# Patient Record
Sex: Female | Born: 1950 | Race: White | Hispanic: No | Marital: Married | Smoking: Never smoker
Health system: Southern US, Community
[De-identification: ages and names within clinical notes are randomized; demographics above are authoritative.]

## PROBLEM LIST (undated history)

## (undated) DIAGNOSIS — K5790 Diverticulosis of intestine, part unspecified, without perforation or abscess without bleeding: Secondary | ICD-10-CM

## (undated) DIAGNOSIS — F32A Depression, unspecified: Secondary | ICD-10-CM

## (undated) DIAGNOSIS — F419 Anxiety disorder, unspecified: Secondary | ICD-10-CM

## (undated) DIAGNOSIS — K439 Ventral hernia without obstruction or gangrene: Secondary | ICD-10-CM

## (undated) DIAGNOSIS — I499 Cardiac arrhythmia, unspecified: Secondary | ICD-10-CM

## (undated) DIAGNOSIS — R933 Abnormal findings on diagnostic imaging of other parts of digestive tract: Secondary | ICD-10-CM

## (undated) DIAGNOSIS — G8929 Other chronic pain: Secondary | ICD-10-CM

## (undated) DIAGNOSIS — K760 Fatty (change of) liver, not elsewhere classified: Secondary | ICD-10-CM

## (undated) DIAGNOSIS — K5792 Diverticulitis of intestine, part unspecified, without perforation or abscess without bleeding: Secondary | ICD-10-CM

## (undated) DIAGNOSIS — G3184 Mild cognitive impairment, so stated: Secondary | ICD-10-CM

## (undated) DIAGNOSIS — F329 Major depressive disorder, single episode, unspecified: Secondary | ICD-10-CM

## (undated) DIAGNOSIS — E119 Type 2 diabetes mellitus without complications: Secondary | ICD-10-CM

## (undated) DIAGNOSIS — I6529 Occlusion and stenosis of unspecified carotid artery: Secondary | ICD-10-CM

## (undated) DIAGNOSIS — K648 Other hemorrhoids: Secondary | ICD-10-CM

## (undated) DIAGNOSIS — Z8489 Family history of other specified conditions: Secondary | ICD-10-CM

## (undated) DIAGNOSIS — M21619 Bunion of unspecified foot: Secondary | ICD-10-CM

## (undated) DIAGNOSIS — Z8719 Personal history of other diseases of the digestive system: Secondary | ICD-10-CM

## (undated) DIAGNOSIS — M79671 Pain in right foot: Secondary | ICD-10-CM

## (undated) DIAGNOSIS — K219 Gastro-esophageal reflux disease without esophagitis: Secondary | ICD-10-CM

## (undated) DIAGNOSIS — G4733 Obstructive sleep apnea (adult) (pediatric): Secondary | ICD-10-CM

## (undated) DIAGNOSIS — R740 Nonspecific elevation of levels of transaminase and lactic acid dehydrogenase [LDH]: Secondary | ICD-10-CM

## (undated) DIAGNOSIS — I639 Cerebral infarction, unspecified: Secondary | ICD-10-CM

## (undated) DIAGNOSIS — I4891 Unspecified atrial fibrillation: Secondary | ICD-10-CM

## (undated) DIAGNOSIS — M47812 Spondylosis without myelopathy or radiculopathy, cervical region: Secondary | ICD-10-CM

## (undated) DIAGNOSIS — R569 Unspecified convulsions: Secondary | ICD-10-CM

## (undated) DIAGNOSIS — R1310 Dysphagia, unspecified: Secondary | ICD-10-CM

## (undated) DIAGNOSIS — M549 Dorsalgia, unspecified: Secondary | ICD-10-CM

## (undated) DIAGNOSIS — G43909 Migraine, unspecified, not intractable, without status migrainosus: Secondary | ICD-10-CM

## (undated) DIAGNOSIS — IMO0002 Reserved for concepts with insufficient information to code with codable children: Secondary | ICD-10-CM

## (undated) DIAGNOSIS — I10 Essential (primary) hypertension: Secondary | ICD-10-CM

## (undated) DIAGNOSIS — M797 Fibromyalgia: Secondary | ICD-10-CM

## (undated) DIAGNOSIS — G40409 Other generalized epilepsy and epileptic syndromes, not intractable, without status epilepticus: Secondary | ICD-10-CM

## (undated) DIAGNOSIS — R197 Diarrhea, unspecified: Secondary | ICD-10-CM

## (undated) DIAGNOSIS — K2950 Unspecified chronic gastritis without bleeding: Secondary | ICD-10-CM

## (undated) DIAGNOSIS — E785 Hyperlipidemia, unspecified: Secondary | ICD-10-CM

## (undated) DIAGNOSIS — K819 Cholecystitis, unspecified: Secondary | ICD-10-CM

## (undated) DIAGNOSIS — R9389 Abnormal findings on diagnostic imaging of other specified body structures: Secondary | ICD-10-CM

## (undated) DIAGNOSIS — M1990 Unspecified osteoarthritis, unspecified site: Secondary | ICD-10-CM

## (undated) DIAGNOSIS — D649 Anemia, unspecified: Secondary | ICD-10-CM

## (undated) DIAGNOSIS — E039 Hypothyroidism, unspecified: Secondary | ICD-10-CM

## (undated) DIAGNOSIS — M62830 Muscle spasm of back: Secondary | ICD-10-CM

## (undated) DIAGNOSIS — G459 Transient cerebral ischemic attack, unspecified: Secondary | ICD-10-CM

## (undated) DIAGNOSIS — E1142 Type 2 diabetes mellitus with diabetic polyneuropathy: Secondary | ICD-10-CM

## (undated) DIAGNOSIS — M25562 Pain in left knee: Secondary | ICD-10-CM

## (undated) DIAGNOSIS — M2550 Pain in unspecified joint: Secondary | ICD-10-CM

## (undated) DIAGNOSIS — G5600 Carpal tunnel syndrome, unspecified upper limb: Secondary | ICD-10-CM

## (undated) DIAGNOSIS — D492 Neoplasm of unspecified behavior of bone, soft tissue, and skin: Secondary | ICD-10-CM

## (undated) DIAGNOSIS — D869 Sarcoidosis, unspecified: Secondary | ICD-10-CM

## (undated) DIAGNOSIS — J189 Pneumonia, unspecified organism: Secondary | ICD-10-CM

## (undated) DIAGNOSIS — T460X1A Poisoning by cardiac-stimulant glycosides and drugs of similar action, accidental (unintentional), initial encounter: Secondary | ICD-10-CM

## (undated) HISTORY — DX: Gastro-esophageal reflux disease without esophagitis: K21.9

## (undated) HISTORY — DX: Poisoning by cardiac-stimulant glycosides and drugs of similar action, accidental (unintentional), initial encounter: T46.0X1A

## (undated) HISTORY — DX: Abnormal findings on diagnostic imaging of other specified body structures: R93.89

## (undated) HISTORY — DX: Muscle spasm of back: M62.830

## (undated) HISTORY — DX: Depression, unspecified: F32.A

## (undated) HISTORY — DX: Fatty (change of) liver, not elsewhere classified: K76.0

## (undated) HISTORY — DX: Occlusion and stenosis of unspecified carotid artery: I65.29

## (undated) HISTORY — PX: BREAST SURGERY: SHX581

## (undated) HISTORY — DX: Unspecified osteoarthritis, unspecified site: M19.90

## (undated) HISTORY — PX: DILATION AND CURETTAGE OF UTERUS: SHX78

## (undated) HISTORY — DX: Fibromyalgia: M79.7

## (undated) HISTORY — DX: Obstructive sleep apnea (adult) (pediatric): G47.33

## (undated) HISTORY — PX: BREAST BIOPSY: SHX20

## (undated) HISTORY — DX: Type 2 diabetes mellitus with diabetic polyneuropathy: E11.42

## (undated) HISTORY — PX: FOOT SURGERY: SHX648

## (undated) HISTORY — DX: Cholecystitis, unspecified: K81.9

## (undated) HISTORY — DX: Nonspecific elevation of levels of transaminase and lactic acid dehydrogenase (ldh): R74.0

## (undated) HISTORY — DX: Other hemorrhoids: K64.8

## (undated) HISTORY — DX: Hypothyroidism, unspecified: E03.9

## (undated) HISTORY — DX: Diverticulosis of intestine, part unspecified, without perforation or abscess without bleeding: K57.90

## (undated) HISTORY — PX: BILATERAL OOPHORECTOMY: SHX1221

## (undated) HISTORY — PX: ABDOMINAL HYSTERECTOMY: SHX81

## (undated) HISTORY — PX: LAPAROSCOPIC CHOLECYSTECTOMY: SUR755

## (undated) HISTORY — DX: Bunion of unspecified foot: M21.619

## (undated) HISTORY — DX: Spondylosis without myelopathy or radiculopathy, cervical region: M47.812

## (undated) HISTORY — PX: ESOPHAGEAL DILATION: SHX303

## (undated) HISTORY — DX: Reserved for concepts with insufficient information to code with codable children: IMO0002

## (undated) HISTORY — DX: Essential (primary) hypertension: I10

## (undated) HISTORY — PX: SALPINGOOPHORECTOMY: SHX82

## (undated) HISTORY — DX: Carpal tunnel syndrome, unspecified upper limb: G56.00

## (undated) HISTORY — DX: Mild cognitive impairment of uncertain or unknown etiology: G31.84

## (undated) HISTORY — DX: Transient cerebral ischemic attack, unspecified: G45.9

## (undated) HISTORY — DX: Unspecified atrial fibrillation: I48.91

## (undated) HISTORY — DX: Neoplasm of unspecified behavior of bone, soft tissue, and skin: D49.2

## (undated) HISTORY — PX: BUNIONECTOMY: SHX129

## (undated) HISTORY — DX: Pain in unspecified joint: M25.50

## (undated) HISTORY — DX: Hyperlipidemia, unspecified: E78.5

## (undated) HISTORY — DX: Diverticulitis of intestine, part unspecified, without perforation or abscess without bleeding: K57.92

## (undated) HISTORY — DX: Major depressive disorder, single episode, unspecified: F32.9

## (undated) HISTORY — DX: Unspecified chronic gastritis without bleeding: K29.50

## (undated) HISTORY — DX: Diarrhea, unspecified: R19.7

## (undated) HISTORY — DX: Pain in left knee: M25.562

## (undated) HISTORY — DX: Migraine, unspecified, not intractable, without status migrainosus: G43.909

## (undated) HISTORY — DX: Sarcoidosis, unspecified: D86.9

## (undated) HISTORY — PX: SKIN CANCER EXCISION: SHX779

## (undated) HISTORY — DX: Pain in right foot: M79.671

---
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
## 1974-03-09 HISTORY — PX: TUBAL LIGATION: SHX77

## 1988-03-09 HISTORY — PX: HIATAL HERNIA REPAIR: SHX195

## 1990-03-09 HISTORY — PX: LIPOSUCTION: SHX10

## 1990-03-09 HISTORY — PX: OTHER SURGICAL HISTORY: SHX169

## 1999-02-25 ENCOUNTER — Encounter (INDEPENDENT_AMBULATORY_CARE_PROVIDER_SITE_OTHER): Payer: Self-pay | Admitting: Gastroenterology

## 1999-02-26 ENCOUNTER — Emergency Department (HOSPITAL_COMMUNITY): Admission: EM | Admit: 1999-02-26 | Discharge: 1999-02-26 | Payer: Self-pay | Admitting: Emergency Medicine

## 1999-06-02 ENCOUNTER — Ambulatory Visit (HOSPITAL_COMMUNITY): Admission: RE | Admit: 1999-06-02 | Discharge: 1999-06-02 | Payer: Self-pay | Admitting: Gastroenterology

## 1999-06-02 ENCOUNTER — Encounter: Payer: Self-pay | Admitting: Gastroenterology

## 2000-01-24 ENCOUNTER — Encounter: Payer: Self-pay | Admitting: Emergency Medicine

## 2000-01-24 ENCOUNTER — Emergency Department (HOSPITAL_COMMUNITY): Admission: EM | Admit: 2000-01-24 | Discharge: 2000-01-24 | Payer: Self-pay | Admitting: Emergency Medicine

## 2000-05-07 ENCOUNTER — Other Ambulatory Visit: Admission: RE | Admit: 2000-05-07 | Discharge: 2000-05-07 | Payer: Self-pay | Admitting: Obstetrics and Gynecology

## 2000-06-23 ENCOUNTER — Other Ambulatory Visit: Admission: RE | Admit: 2000-06-23 | Discharge: 2000-06-23 | Payer: Self-pay | Admitting: Obstetrics and Gynecology

## 2000-08-02 ENCOUNTER — Emergency Department (HOSPITAL_COMMUNITY): Admission: EM | Admit: 2000-08-02 | Discharge: 2000-08-02 | Payer: Self-pay | Admitting: Internal Medicine

## 2000-10-28 ENCOUNTER — Ambulatory Visit (HOSPITAL_COMMUNITY): Admission: RE | Admit: 2000-10-28 | Discharge: 2000-10-28 | Payer: Self-pay | Admitting: Gastroenterology

## 2000-10-28 ENCOUNTER — Encounter (INDEPENDENT_AMBULATORY_CARE_PROVIDER_SITE_OTHER): Payer: Self-pay | Admitting: Specialist

## 2000-11-17 ENCOUNTER — Inpatient Hospital Stay (HOSPITAL_COMMUNITY): Admission: EM | Admit: 2000-11-17 | Discharge: 2000-11-19 | Payer: Self-pay | Admitting: *Deleted

## 2000-11-18 ENCOUNTER — Encounter: Payer: Self-pay | Admitting: Internal Medicine

## 2001-01-31 ENCOUNTER — Ambulatory Visit (HOSPITAL_COMMUNITY): Admission: RE | Admit: 2001-01-31 | Discharge: 2001-01-31 | Payer: Self-pay | Admitting: Internal Medicine

## 2001-02-09 ENCOUNTER — Encounter: Payer: Self-pay | Admitting: Emergency Medicine

## 2001-02-09 ENCOUNTER — Encounter: Payer: Self-pay | Admitting: Internal Medicine

## 2001-02-09 ENCOUNTER — Inpatient Hospital Stay (HOSPITAL_COMMUNITY): Admission: EM | Admit: 2001-02-09 | Discharge: 2001-02-14 | Payer: Self-pay | Admitting: Emergency Medicine

## 2001-03-15 ENCOUNTER — Encounter: Payer: Self-pay | Admitting: Gastroenterology

## 2001-03-15 ENCOUNTER — Ambulatory Visit (HOSPITAL_COMMUNITY): Admission: RE | Admit: 2001-03-15 | Discharge: 2001-03-15 | Payer: Self-pay | Admitting: Gastroenterology

## 2001-03-18 ENCOUNTER — Ambulatory Visit (HOSPITAL_COMMUNITY): Admission: RE | Admit: 2001-03-18 | Discharge: 2001-03-18 | Payer: Self-pay | Admitting: Gastroenterology

## 2001-03-18 ENCOUNTER — Encounter: Payer: Self-pay | Admitting: Gastroenterology

## 2001-04-27 ENCOUNTER — Encounter: Admission: RE | Admit: 2001-04-27 | Discharge: 2001-04-27 | Payer: Self-pay | Admitting: *Deleted

## 2001-05-02 ENCOUNTER — Encounter: Admission: RE | Admit: 2001-05-02 | Discharge: 2001-05-02 | Payer: Self-pay | Admitting: *Deleted

## 2001-05-05 ENCOUNTER — Ambulatory Visit (HOSPITAL_COMMUNITY): Admission: RE | Admit: 2001-05-05 | Discharge: 2001-05-05 | Payer: Self-pay | Admitting: Gastroenterology

## 2001-05-17 ENCOUNTER — Ambulatory Visit (HOSPITAL_COMMUNITY): Admission: RE | Admit: 2001-05-17 | Discharge: 2001-05-17 | Payer: Self-pay | Admitting: Internal Medicine

## 2001-06-17 ENCOUNTER — Encounter: Payer: Self-pay | Admitting: Internal Medicine

## 2001-06-17 ENCOUNTER — Ambulatory Visit (HOSPITAL_COMMUNITY): Admission: RE | Admit: 2001-06-17 | Discharge: 2001-06-17 | Payer: Self-pay | Admitting: Internal Medicine

## 2002-06-29 ENCOUNTER — Inpatient Hospital Stay (HOSPITAL_COMMUNITY): Admission: EM | Admit: 2002-06-29 | Discharge: 2002-07-05 | Payer: Self-pay | Admitting: Cardiology

## 2002-06-29 ENCOUNTER — Encounter: Payer: Self-pay | Admitting: Emergency Medicine

## 2002-07-01 ENCOUNTER — Encounter: Payer: Self-pay | Admitting: Cardiology

## 2002-07-04 ENCOUNTER — Encounter: Payer: Self-pay | Admitting: Cardiovascular Disease

## 2002-09-18 ENCOUNTER — Encounter: Admission: RE | Admit: 2002-09-18 | Discharge: 2002-09-18 | Payer: Self-pay | Admitting: Internal Medicine

## 2002-10-10 ENCOUNTER — Ambulatory Visit (HOSPITAL_COMMUNITY): Admission: RE | Admit: 2002-10-10 | Discharge: 2002-10-10 | Payer: Self-pay | Admitting: Physician Assistant

## 2002-11-07 ENCOUNTER — Ambulatory Visit (HOSPITAL_BASED_OUTPATIENT_CLINIC_OR_DEPARTMENT_OTHER): Admission: RE | Admit: 2002-11-07 | Discharge: 2002-11-07 | Payer: Self-pay | Admitting: Cardiology

## 2002-11-14 ENCOUNTER — Encounter: Admission: RE | Admit: 2002-11-14 | Discharge: 2002-11-14 | Payer: Self-pay | Admitting: Internal Medicine

## 2002-11-28 ENCOUNTER — Encounter: Admission: RE | Admit: 2002-11-28 | Discharge: 2002-11-28 | Payer: Self-pay | Admitting: Internal Medicine

## 2002-12-07 ENCOUNTER — Encounter: Admission: RE | Admit: 2002-12-07 | Discharge: 2002-12-07 | Payer: Self-pay | Admitting: Internal Medicine

## 2002-12-29 ENCOUNTER — Encounter: Admission: RE | Admit: 2002-12-29 | Discharge: 2002-12-29 | Payer: Self-pay | Admitting: Internal Medicine

## 2003-02-12 ENCOUNTER — Encounter: Admission: RE | Admit: 2003-02-12 | Discharge: 2003-02-12 | Payer: Self-pay | Admitting: Internal Medicine

## 2003-02-14 ENCOUNTER — Ambulatory Visit (HOSPITAL_COMMUNITY): Admission: RE | Admit: 2003-02-14 | Discharge: 2003-02-14 | Payer: Self-pay | Admitting: *Deleted

## 2003-03-14 ENCOUNTER — Encounter: Admission: RE | Admit: 2003-03-14 | Discharge: 2003-03-14 | Payer: Self-pay | Admitting: Internal Medicine

## 2003-03-21 ENCOUNTER — Encounter: Admission: RE | Admit: 2003-03-21 | Discharge: 2003-03-21 | Payer: Self-pay | Admitting: Internal Medicine

## 2003-03-21 ENCOUNTER — Ambulatory Visit (HOSPITAL_COMMUNITY): Admission: RE | Admit: 2003-03-21 | Discharge: 2003-03-21 | Payer: Self-pay | Admitting: Internal Medicine

## 2003-04-04 ENCOUNTER — Encounter: Admission: RE | Admit: 2003-04-04 | Discharge: 2003-04-04 | Payer: Self-pay | Admitting: Internal Medicine

## 2003-05-08 ENCOUNTER — Encounter (INDEPENDENT_AMBULATORY_CARE_PROVIDER_SITE_OTHER): Payer: Self-pay | Admitting: Internal Medicine

## 2003-05-08 ENCOUNTER — Emergency Department (HOSPITAL_COMMUNITY): Admission: AD | Admit: 2003-05-08 | Discharge: 2003-05-08 | Payer: Self-pay | Admitting: *Deleted

## 2003-06-25 ENCOUNTER — Encounter: Admission: RE | Admit: 2003-06-25 | Discharge: 2003-06-25 | Payer: Self-pay | Admitting: Internal Medicine

## 2003-06-26 ENCOUNTER — Ambulatory Visit (HOSPITAL_COMMUNITY): Admission: RE | Admit: 2003-06-26 | Discharge: 2003-06-26 | Payer: Self-pay | Admitting: Internal Medicine

## 2003-10-25 ENCOUNTER — Encounter: Admission: RE | Admit: 2003-10-25 | Discharge: 2003-10-25 | Payer: Self-pay | Admitting: Internal Medicine

## 2003-11-19 ENCOUNTER — Ambulatory Visit: Payer: Self-pay | Admitting: Internal Medicine

## 2004-01-22 ENCOUNTER — Ambulatory Visit: Payer: Self-pay | Admitting: Internal Medicine

## 2004-01-22 ENCOUNTER — Ambulatory Visit (HOSPITAL_COMMUNITY): Admission: RE | Admit: 2004-01-22 | Discharge: 2004-01-22 | Payer: Self-pay | Admitting: Internal Medicine

## 2004-02-05 ENCOUNTER — Ambulatory Visit: Payer: Self-pay | Admitting: Internal Medicine

## 2004-02-11 ENCOUNTER — Ambulatory Visit: Payer: Self-pay | Admitting: Cardiology

## 2004-02-29 ENCOUNTER — Ambulatory Visit (HOSPITAL_COMMUNITY): Admission: RE | Admit: 2004-02-29 | Discharge: 2004-02-29 | Payer: Self-pay | Admitting: Cardiology

## 2004-02-29 ENCOUNTER — Ambulatory Visit: Payer: Self-pay | Admitting: Internal Medicine

## 2004-03-04 ENCOUNTER — Ambulatory Visit (HOSPITAL_COMMUNITY): Admission: RE | Admit: 2004-03-04 | Discharge: 2004-03-04 | Payer: Self-pay | Admitting: Obstetrics and Gynecology

## 2004-05-01 ENCOUNTER — Ambulatory Visit: Payer: Self-pay | Admitting: Internal Medicine

## 2004-05-07 ENCOUNTER — Ambulatory Visit (HOSPITAL_COMMUNITY): Admission: RE | Admit: 2004-05-07 | Discharge: 2004-05-07 | Payer: Self-pay | Admitting: Internal Medicine

## 2004-05-14 ENCOUNTER — Ambulatory Visit: Payer: Self-pay | Admitting: Internal Medicine

## 2004-05-28 ENCOUNTER — Ambulatory Visit: Payer: Self-pay | Admitting: Gastroenterology

## 2004-06-02 ENCOUNTER — Ambulatory Visit (HOSPITAL_COMMUNITY): Admission: RE | Admit: 2004-06-02 | Discharge: 2004-06-02 | Payer: Self-pay | Admitting: Gastroenterology

## 2004-06-12 ENCOUNTER — Ambulatory Visit: Payer: Self-pay | Admitting: Cardiology

## 2004-07-10 ENCOUNTER — Ambulatory Visit: Payer: Self-pay | Admitting: Gastroenterology

## 2004-07-10 ENCOUNTER — Ambulatory Visit (HOSPITAL_COMMUNITY): Admission: RE | Admit: 2004-07-10 | Discharge: 2004-07-10 | Payer: Self-pay | Admitting: Gastroenterology

## 2004-07-22 ENCOUNTER — Ambulatory Visit: Payer: Self-pay | Admitting: Cardiology

## 2004-07-29 ENCOUNTER — Ambulatory Visit: Payer: Self-pay | Admitting: Cardiology

## 2004-08-06 ENCOUNTER — Ambulatory Visit: Payer: Self-pay | Admitting: Cardiology

## 2004-08-12 ENCOUNTER — Ambulatory Visit: Payer: Self-pay | Admitting: Internal Medicine

## 2004-08-13 ENCOUNTER — Ambulatory Visit: Payer: Self-pay | Admitting: Cardiology

## 2004-08-20 ENCOUNTER — Ambulatory Visit: Payer: Self-pay | Admitting: Cardiology

## 2004-09-08 ENCOUNTER — Ambulatory Visit: Payer: Self-pay | Admitting: Cardiology

## 2004-10-06 ENCOUNTER — Ambulatory Visit: Payer: Self-pay | Admitting: Cardiology

## 2004-11-03 ENCOUNTER — Ambulatory Visit: Payer: Self-pay | Admitting: Cardiology

## 2004-11-09 ENCOUNTER — Emergency Department (HOSPITAL_COMMUNITY): Admission: EM | Admit: 2004-11-09 | Discharge: 2004-11-10 | Payer: Self-pay | Admitting: Emergency Medicine

## 2004-11-10 ENCOUNTER — Ambulatory Visit (HOSPITAL_COMMUNITY): Admission: RE | Admit: 2004-11-10 | Discharge: 2004-11-10 | Payer: Self-pay | Admitting: Emergency Medicine

## 2004-11-11 ENCOUNTER — Ambulatory Visit: Payer: Self-pay | Admitting: Internal Medicine

## 2004-11-11 ENCOUNTER — Ambulatory Visit (HOSPITAL_COMMUNITY): Admission: RE | Admit: 2004-11-11 | Discharge: 2004-11-11 | Payer: Self-pay | Admitting: Internal Medicine

## 2004-11-13 ENCOUNTER — Ambulatory Visit: Payer: Self-pay | Admitting: Internal Medicine

## 2004-11-17 ENCOUNTER — Ambulatory Visit: Payer: Self-pay | Admitting: Internal Medicine

## 2004-11-18 ENCOUNTER — Ambulatory Visit: Payer: Self-pay | Admitting: Sports Medicine

## 2004-11-25 ENCOUNTER — Ambulatory Visit: Payer: Self-pay | Admitting: Internal Medicine

## 2004-12-03 ENCOUNTER — Ambulatory Visit: Payer: Self-pay | Admitting: Sports Medicine

## 2004-12-03 ENCOUNTER — Ambulatory Visit: Payer: Self-pay | Admitting: Cardiology

## 2004-12-15 ENCOUNTER — Ambulatory Visit: Payer: Self-pay | Admitting: Cardiology

## 2004-12-23 ENCOUNTER — Ambulatory Visit: Payer: Self-pay | Admitting: Internal Medicine

## 2005-01-05 ENCOUNTER — Ambulatory Visit: Payer: Self-pay | Admitting: Internal Medicine

## 2005-01-28 ENCOUNTER — Ambulatory Visit: Payer: Self-pay | Admitting: Sports Medicine

## 2005-01-28 ENCOUNTER — Ambulatory Visit (HOSPITAL_COMMUNITY): Admission: RE | Admit: 2005-01-28 | Discharge: 2005-01-28 | Payer: Self-pay | Admitting: *Deleted

## 2005-02-02 ENCOUNTER — Ambulatory Visit: Payer: Self-pay | Admitting: Cardiology

## 2005-02-20 ENCOUNTER — Ambulatory Visit: Payer: Self-pay | Admitting: Cardiology

## 2005-02-24 ENCOUNTER — Ambulatory Visit: Payer: Self-pay | Admitting: Sports Medicine

## 2005-03-10 ENCOUNTER — Ambulatory Visit: Payer: Self-pay | Admitting: Sports Medicine

## 2005-03-12 ENCOUNTER — Ambulatory Visit: Payer: Self-pay | Admitting: Internal Medicine

## 2005-03-20 ENCOUNTER — Ambulatory Visit: Payer: Self-pay | Admitting: Internal Medicine

## 2005-04-10 ENCOUNTER — Encounter: Admission: RE | Admit: 2005-04-10 | Discharge: 2005-04-10 | Payer: Self-pay | Admitting: Sports Medicine

## 2005-04-17 ENCOUNTER — Ambulatory Visit: Payer: Self-pay | Admitting: Internal Medicine

## 2005-04-29 ENCOUNTER — Ambulatory Visit: Payer: Self-pay | Admitting: Cardiology

## 2005-04-29 ENCOUNTER — Ambulatory Visit: Payer: Self-pay | Admitting: Gastroenterology

## 2005-05-07 ENCOUNTER — Ambulatory Visit: Payer: Self-pay | Admitting: Gastroenterology

## 2005-05-14 ENCOUNTER — Ambulatory Visit: Payer: Self-pay | Admitting: Cardiology

## 2005-05-19 ENCOUNTER — Ambulatory Visit: Payer: Self-pay | Admitting: *Deleted

## 2005-06-02 ENCOUNTER — Ambulatory Visit: Payer: Self-pay | Admitting: Cardiology

## 2005-07-03 ENCOUNTER — Ambulatory Visit: Payer: Self-pay | Admitting: Cardiology

## 2005-07-20 ENCOUNTER — Ambulatory Visit: Payer: Self-pay | Admitting: Internal Medicine

## 2005-07-31 ENCOUNTER — Ambulatory Visit: Payer: Self-pay | Admitting: Cardiology

## 2005-08-28 ENCOUNTER — Ambulatory Visit: Payer: Self-pay | Admitting: Cardiovascular Disease

## 2005-09-25 ENCOUNTER — Ambulatory Visit: Payer: Self-pay | Admitting: Internal Medicine

## 2005-09-25 ENCOUNTER — Ambulatory Visit: Payer: Self-pay | Admitting: Cardiology

## 2005-09-26 ENCOUNTER — Emergency Department (HOSPITAL_COMMUNITY): Admission: EM | Admit: 2005-09-26 | Discharge: 2005-09-26 | Payer: Self-pay | Admitting: Emergency Medicine

## 2005-09-28 ENCOUNTER — Ambulatory Visit: Payer: Self-pay | Admitting: Internal Medicine

## 2005-09-28 ENCOUNTER — Inpatient Hospital Stay (HOSPITAL_COMMUNITY): Admission: EM | Admit: 2005-09-28 | Discharge: 2005-10-08 | Payer: Self-pay | Admitting: Emergency Medicine

## 2005-09-28 ENCOUNTER — Encounter: Admission: RE | Admit: 2005-09-28 | Discharge: 2005-09-28 | Payer: Self-pay | Admitting: Internal Medicine

## 2005-09-29 ENCOUNTER — Ambulatory Visit: Payer: Self-pay | Admitting: Cardiology

## 2005-09-29 ENCOUNTER — Ambulatory Visit: Payer: Self-pay | Admitting: Internal Medicine

## 2005-09-29 ENCOUNTER — Encounter: Payer: Self-pay | Admitting: Cardiology

## 2005-10-13 ENCOUNTER — Ambulatory Visit: Payer: Self-pay | Admitting: Internal Medicine

## 2005-10-14 ENCOUNTER — Ambulatory Visit: Payer: Self-pay | Admitting: Cardiology

## 2005-10-16 ENCOUNTER — Ambulatory Visit: Payer: Self-pay | Admitting: Critical Care Medicine

## 2005-10-23 ENCOUNTER — Ambulatory Visit: Admission: RE | Admit: 2005-10-23 | Discharge: 2005-10-23 | Payer: Self-pay | Admitting: Critical Care Medicine

## 2005-10-26 ENCOUNTER — Ambulatory Visit: Payer: Self-pay | Admitting: Internal Medicine

## 2005-10-30 ENCOUNTER — Ambulatory Visit: Payer: Self-pay | Admitting: Cardiology

## 2005-11-04 ENCOUNTER — Ambulatory Visit: Payer: Self-pay | Admitting: Critical Care Medicine

## 2005-11-16 ENCOUNTER — Ambulatory Visit: Payer: Self-pay | Admitting: *Deleted

## 2005-12-07 ENCOUNTER — Ambulatory Visit: Payer: Self-pay | Admitting: Cardiology

## 2005-12-21 ENCOUNTER — Ambulatory Visit: Payer: Self-pay | Admitting: Internal Medicine

## 2005-12-21 DIAGNOSIS — I4891 Unspecified atrial fibrillation: Secondary | ICD-10-CM | POA: Insufficient documentation

## 2005-12-21 DIAGNOSIS — I10 Essential (primary) hypertension: Secondary | ICD-10-CM | POA: Insufficient documentation

## 2005-12-21 DIAGNOSIS — K222 Esophageal obstruction: Secondary | ICD-10-CM | POA: Insufficient documentation

## 2005-12-21 DIAGNOSIS — E039 Hypothyroidism, unspecified: Secondary | ICD-10-CM | POA: Insufficient documentation

## 2005-12-21 DIAGNOSIS — M1990 Unspecified osteoarthritis, unspecified site: Secondary | ICD-10-CM | POA: Insufficient documentation

## 2005-12-24 ENCOUNTER — Encounter (INDEPENDENT_AMBULATORY_CARE_PROVIDER_SITE_OTHER): Payer: Self-pay | Admitting: Internal Medicine

## 2005-12-24 ENCOUNTER — Ambulatory Visit (HOSPITAL_COMMUNITY): Admission: RE | Admit: 2005-12-24 | Discharge: 2005-12-24 | Payer: Self-pay | Admitting: Internal Medicine

## 2006-01-04 ENCOUNTER — Ambulatory Visit: Payer: Self-pay | Admitting: Cardiology

## 2006-01-11 ENCOUNTER — Ambulatory Visit: Payer: Self-pay | Admitting: Hospitalist

## 2006-01-11 ENCOUNTER — Encounter (INDEPENDENT_AMBULATORY_CARE_PROVIDER_SITE_OTHER): Payer: Self-pay | Admitting: Internal Medicine

## 2006-01-11 LAB — CONVERTED CEMR LAB
Cholesterol: 140 mg/dL (ref 0–200)
HDL: 35 mg/dL — ABNORMAL LOW (ref >39)
Total CHOL/HDL Ratio: 4
Triglycerides: 354 mg/dL — ABNORMAL HIGH (ref <150)

## 2006-02-01 ENCOUNTER — Ambulatory Visit: Payer: Self-pay | Admitting: Cardiology

## 2006-02-15 ENCOUNTER — Ambulatory Visit: Payer: Self-pay | Admitting: Internal Medicine

## 2006-02-15 ENCOUNTER — Encounter (INDEPENDENT_AMBULATORY_CARE_PROVIDER_SITE_OTHER): Payer: Self-pay | Admitting: *Deleted

## 2006-02-22 ENCOUNTER — Ambulatory Visit: Payer: Self-pay | Admitting: Cardiology

## 2006-02-26 ENCOUNTER — Encounter: Admission: RE | Admit: 2006-02-26 | Discharge: 2006-02-26 | Payer: Self-pay | Admitting: *Deleted

## 2006-03-09 HISTORY — PX: BREAST EXCISIONAL BIOPSY: SUR124

## 2006-03-17 ENCOUNTER — Encounter (INDEPENDENT_AMBULATORY_CARE_PROVIDER_SITE_OTHER): Payer: Self-pay | Admitting: Internal Medicine

## 2006-03-17 ENCOUNTER — Ambulatory Visit: Payer: Self-pay | Admitting: Hospitalist

## 2006-03-17 LAB — CONVERTED CEMR LAB
BUN: 5 mg/dL — ABNORMAL LOW (ref 6–23)
Basophils Absolute: 0 10*3/uL (ref 0.0–0.1)
Basophils Relative: 0 % (ref 0–1)
Calcium: 9.8 mg/dL (ref 8.4–10.5)
Chloride: 100 meq/L (ref 96–112)
Creatinine, Ser: 0.69 mg/dL (ref 0.40–1.20)
Eosinophils Relative: 5 % (ref 0–5)
Glucose, Bld: 90 mg/dL (ref 70–99)
HCT: 43.3 % (ref 36.0–46.0)
Hemoglobin: 14.7 g/dL (ref 12.0–15.0)
INR: 1.6 — ABNORMAL HIGH (ref 0.0–1.5)
Lymphocytes Relative: 31 % (ref 12–46)
Lymphs Abs: 2.5 10*3/uL (ref 0.7–3.3)
MCHC: 34 g/dL (ref 30.0–36.0)
MCV: 95.4 fL (ref 78.0–100.0)
Monocytes Absolute: 0.8 10*3/uL — ABNORMAL HIGH (ref 0.2–0.7)
Monocytes Relative: 10 % (ref 3–11)
Neutro Abs: 4.4 10*3/uL (ref 1.7–7.7)
Platelets: 309 10*3/uL (ref 150–400)
Potassium: 4.4 meq/L (ref 3.5–5.3)
Prothrombin Time: 19.5 s — ABNORMAL HIGH (ref 11.6–15.2)
RBC: 4.54 M/uL (ref 3.87–5.11)
RDW: 12.6 % (ref 11.5–14.0)
WBC: 8.1 10*3/uL (ref 4.0–10.5)

## 2006-03-18 DIAGNOSIS — Z8719 Personal history of other diseases of the digestive system: Secondary | ICD-10-CM | POA: Insufficient documentation

## 2006-03-18 DIAGNOSIS — Z9089 Acquired absence of other organs: Secondary | ICD-10-CM | POA: Insufficient documentation

## 2006-03-18 DIAGNOSIS — E119 Type 2 diabetes mellitus without complications: Secondary | ICD-10-CM | POA: Insufficient documentation

## 2006-03-18 DIAGNOSIS — Z9079 Acquired absence of other genital organ(s): Secondary | ICD-10-CM | POA: Insufficient documentation

## 2006-03-18 DIAGNOSIS — K5730 Diverticulosis of large intestine without perforation or abscess without bleeding: Secondary | ICD-10-CM | POA: Insufficient documentation

## 2006-03-23 ENCOUNTER — Ambulatory Visit: Payer: Self-pay | Admitting: Cardiology

## 2006-03-25 ENCOUNTER — Ambulatory Visit (HOSPITAL_BASED_OUTPATIENT_CLINIC_OR_DEPARTMENT_OTHER): Admission: RE | Admit: 2006-03-25 | Discharge: 2006-03-25 | Payer: Self-pay | Admitting: General Surgery

## 2006-03-25 ENCOUNTER — Encounter (INDEPENDENT_AMBULATORY_CARE_PROVIDER_SITE_OTHER): Payer: Self-pay | Admitting: Specialist

## 2006-04-05 ENCOUNTER — Ambulatory Visit: Payer: Self-pay | Admitting: Internal Medicine

## 2006-04-05 DIAGNOSIS — N63 Unspecified lump in unspecified breast: Secondary | ICD-10-CM | POA: Insufficient documentation

## 2006-04-05 DIAGNOSIS — E1149 Type 2 diabetes mellitus with other diabetic neurological complication: Secondary | ICD-10-CM | POA: Insufficient documentation

## 2006-04-05 LAB — CONVERTED CEMR LAB
Blood Glucose, Fingerstick: 99
Glucose, Bld: 99 mg/dL
Hgb A1c MFr Bld: 5.6 %

## 2006-05-03 ENCOUNTER — Ambulatory Visit: Payer: Self-pay | Admitting: Cardiology

## 2006-05-07 ENCOUNTER — Ambulatory Visit: Payer: Self-pay | Admitting: Hospitalist

## 2006-05-07 ENCOUNTER — Encounter (INDEPENDENT_AMBULATORY_CARE_PROVIDER_SITE_OTHER): Payer: Self-pay | Admitting: Internal Medicine

## 2006-05-07 ENCOUNTER — Ambulatory Visit (HOSPITAL_COMMUNITY): Admission: RE | Admit: 2006-05-07 | Discharge: 2006-05-07 | Payer: Self-pay | Admitting: Internal Medicine

## 2006-05-07 DIAGNOSIS — M545 Low back pain, unspecified: Secondary | ICD-10-CM | POA: Insufficient documentation

## 2006-05-07 LAB — CONVERTED CEMR LAB: TSH: 0.768 microintl units/mL (ref 0.350–5.50)

## 2006-05-08 ENCOUNTER — Encounter (INDEPENDENT_AMBULATORY_CARE_PROVIDER_SITE_OTHER): Payer: Self-pay | Admitting: Internal Medicine

## 2006-05-31 ENCOUNTER — Ambulatory Visit: Payer: Self-pay | Admitting: Cardiology

## 2006-05-31 ENCOUNTER — Ambulatory Visit: Payer: Self-pay | Admitting: Internal Medicine

## 2006-05-31 ENCOUNTER — Encounter (INDEPENDENT_AMBULATORY_CARE_PROVIDER_SITE_OTHER): Payer: Self-pay | Admitting: *Deleted

## 2006-05-31 ENCOUNTER — Ambulatory Visit (HOSPITAL_COMMUNITY): Admission: RE | Admit: 2006-05-31 | Discharge: 2006-05-31 | Payer: Self-pay | Admitting: Internal Medicine

## 2006-05-31 ENCOUNTER — Encounter (INDEPENDENT_AMBULATORY_CARE_PROVIDER_SITE_OTHER): Payer: Self-pay | Admitting: Internal Medicine

## 2006-06-15 ENCOUNTER — Ambulatory Visit: Payer: Self-pay | Admitting: Cardiology

## 2006-06-18 ENCOUNTER — Ambulatory Visit: Payer: Self-pay | Admitting: Internal Medicine

## 2006-06-18 DIAGNOSIS — E781 Pure hyperglyceridemia: Secondary | ICD-10-CM | POA: Insufficient documentation

## 2006-06-18 LAB — CONVERTED CEMR LAB: Blood Glucose, Fingerstick: 140

## 2006-06-28 ENCOUNTER — Encounter (INDEPENDENT_AMBULATORY_CARE_PROVIDER_SITE_OTHER): Payer: Self-pay | Admitting: Internal Medicine

## 2006-06-28 ENCOUNTER — Ambulatory Visit: Payer: Self-pay | Admitting: Internal Medicine

## 2006-07-02 ENCOUNTER — Telehealth: Payer: Self-pay | Admitting: *Deleted

## 2006-07-06 ENCOUNTER — Ambulatory Visit: Payer: Self-pay | Admitting: Cardiology

## 2006-07-16 LAB — CONVERTED CEMR LAB
Cholesterol: 215 mg/dL — ABNORMAL HIGH (ref 0–200)
HDL: 35 mg/dL — ABNORMAL LOW (ref >39)
Total CHOL/HDL Ratio: 6.1
Triglycerides: 541 mg/dL — ABNORMAL HIGH (ref <150)

## 2006-07-19 ENCOUNTER — Telehealth (INDEPENDENT_AMBULATORY_CARE_PROVIDER_SITE_OTHER): Payer: Self-pay | Admitting: *Deleted

## 2006-07-20 ENCOUNTER — Ambulatory Visit: Payer: Self-pay | Admitting: Cardiology

## 2006-08-10 ENCOUNTER — Ambulatory Visit: Payer: Self-pay | Admitting: Cardiology

## 2006-08-10 ENCOUNTER — Ambulatory Visit: Payer: Self-pay | Admitting: Internal Medicine

## 2006-08-10 ENCOUNTER — Ambulatory Visit (HOSPITAL_COMMUNITY): Admission: RE | Admit: 2006-08-10 | Discharge: 2006-08-10 | Payer: Self-pay | Admitting: Internal Medicine

## 2006-08-10 ENCOUNTER — Encounter (INDEPENDENT_AMBULATORY_CARE_PROVIDER_SITE_OTHER): Payer: Self-pay | Admitting: Internal Medicine

## 2006-08-10 LAB — CONVERTED CEMR LAB
BUN: 9 mg/dL (ref 6–23)
CO2: 33 meq/L — ABNORMAL HIGH (ref 19–32)
Calcium: 10.2 mg/dL (ref 8.4–10.5)
Chloride: 100 meq/L (ref 96–112)
Glucose, Bld: 137 mg/dL — ABNORMAL HIGH (ref 70–99)
MCHC: 33.7 g/dL (ref 30.0–36.0)
MCV: 95.1 fL (ref 78.0–100.0)
Platelets: 329 10*3/uL (ref 150–400)
Potassium: 4.1 meq/L (ref 3.5–5.3)
RBC: 4.65 M/uL (ref 3.87–5.11)
RDW: 13.1 % (ref 11.5–14.0)
Sodium: 140 meq/L (ref 135–145)
WBC: 7.8 10*3/uL (ref 4.0–10.5)

## 2006-08-11 ENCOUNTER — Telehealth (INDEPENDENT_AMBULATORY_CARE_PROVIDER_SITE_OTHER): Payer: Self-pay | Admitting: Internal Medicine

## 2006-08-17 ENCOUNTER — Telehealth (INDEPENDENT_AMBULATORY_CARE_PROVIDER_SITE_OTHER): Payer: Self-pay | Admitting: *Deleted

## 2006-08-20 ENCOUNTER — Ambulatory Visit: Payer: Self-pay | Admitting: Cardiovascular Disease

## 2006-09-07 ENCOUNTER — Ambulatory Visit: Payer: Self-pay | Admitting: Cardiology

## 2006-10-11 ENCOUNTER — Telehealth (INDEPENDENT_AMBULATORY_CARE_PROVIDER_SITE_OTHER): Payer: Self-pay | Admitting: *Deleted

## 2006-10-13 ENCOUNTER — Ambulatory Visit: Payer: Self-pay | Admitting: Cardiology

## 2006-10-18 ENCOUNTER — Encounter: Admission: RE | Admit: 2006-10-18 | Discharge: 2006-10-18 | Payer: Self-pay | Admitting: General Surgery

## 2006-10-26 ENCOUNTER — Telehealth: Payer: Self-pay | Admitting: *Deleted

## 2006-10-26 ENCOUNTER — Encounter: Admission: RE | Admit: 2006-10-26 | Discharge: 2006-10-26 | Payer: Self-pay | Admitting: General Surgery

## 2006-10-27 ENCOUNTER — Ambulatory Visit: Payer: Self-pay | Admitting: Internal Medicine

## 2006-10-27 ENCOUNTER — Encounter (INDEPENDENT_AMBULATORY_CARE_PROVIDER_SITE_OTHER): Payer: Self-pay | Admitting: *Deleted

## 2006-10-27 ENCOUNTER — Ambulatory Visit: Payer: Self-pay | Admitting: Cardiology

## 2006-10-27 LAB — CONVERTED CEMR LAB: TSH: 1.022 microintl units/mL (ref 0.350–5.50)

## 2006-10-28 ENCOUNTER — Encounter (INDEPENDENT_AMBULATORY_CARE_PROVIDER_SITE_OTHER): Payer: Self-pay | Admitting: Internal Medicine

## 2006-10-28 ENCOUNTER — Ambulatory Visit: Payer: Self-pay | Admitting: Cardiology

## 2006-11-02 ENCOUNTER — Telehealth: Payer: Self-pay | Admitting: *Deleted

## 2006-11-03 ENCOUNTER — Ambulatory Visit: Payer: Self-pay | Admitting: Cardiology

## 2006-11-09 ENCOUNTER — Encounter: Admission: RE | Admit: 2006-11-09 | Discharge: 2006-11-09 | Payer: Self-pay | Admitting: General Surgery

## 2006-11-10 ENCOUNTER — Ambulatory Visit (HOSPITAL_BASED_OUTPATIENT_CLINIC_OR_DEPARTMENT_OTHER): Admission: RE | Admit: 2006-11-10 | Discharge: 2006-11-10 | Payer: Self-pay | Admitting: General Surgery

## 2006-11-10 ENCOUNTER — Encounter (INDEPENDENT_AMBULATORY_CARE_PROVIDER_SITE_OTHER): Payer: Self-pay | Admitting: General Surgery

## 2006-11-18 ENCOUNTER — Ambulatory Visit: Payer: Self-pay | Admitting: Cardiology

## 2006-11-29 ENCOUNTER — Encounter: Admission: RE | Admit: 2006-11-29 | Discharge: 2006-11-29 | Payer: Self-pay | Admitting: General Surgery

## 2006-12-06 ENCOUNTER — Ambulatory Visit: Payer: Self-pay | Admitting: Cardiovascular Disease

## 2006-12-06 ENCOUNTER — Telehealth (INDEPENDENT_AMBULATORY_CARE_PROVIDER_SITE_OTHER): Payer: Self-pay | Admitting: *Deleted

## 2006-12-16 ENCOUNTER — Ambulatory Visit: Payer: Self-pay | Admitting: Cardiology

## 2006-12-27 ENCOUNTER — Ambulatory Visit: Payer: Self-pay | Admitting: Infectious Diseases

## 2006-12-27 ENCOUNTER — Ambulatory Visit: Payer: Self-pay | Admitting: Internal Medicine

## 2007-01-13 ENCOUNTER — Telehealth (INDEPENDENT_AMBULATORY_CARE_PROVIDER_SITE_OTHER): Payer: Self-pay | Admitting: Internal Medicine

## 2007-01-14 ENCOUNTER — Ambulatory Visit: Payer: Self-pay | Admitting: Cardiology

## 2007-01-27 ENCOUNTER — Ambulatory Visit: Payer: Self-pay | Admitting: Internal Medicine

## 2007-01-27 LAB — CONVERTED CEMR LAB
Blood Glucose, Fingerstick: 325
Hgb A1c MFr Bld: 7.6 %

## 2007-01-28 ENCOUNTER — Ambulatory Visit: Payer: Self-pay | Admitting: Internal Medicine

## 2007-01-28 ENCOUNTER — Ambulatory Visit: Payer: Self-pay

## 2007-01-28 ENCOUNTER — Encounter (INDEPENDENT_AMBULATORY_CARE_PROVIDER_SITE_OTHER): Payer: Self-pay | Admitting: Internal Medicine

## 2007-01-28 LAB — CONVERTED CEMR LAB: Blood Glucose, Home Monitor: 1 mg/dL

## 2007-02-06 ENCOUNTER — Encounter (INDEPENDENT_AMBULATORY_CARE_PROVIDER_SITE_OTHER): Payer: Self-pay | Admitting: Internal Medicine

## 2007-02-06 LAB — CONVERTED CEMR LAB
Albumin: 4.2 g/dL (ref 3.5–5.2)
Alkaline Phosphatase: 101 units/L (ref 39–117)
BUN: 9 mg/dL (ref 6–23)
CO2: 27 meq/L (ref 19–32)
Chloride: 101 meq/L (ref 96–112)
Cholesterol: 237 mg/dL — ABNORMAL HIGH (ref 0–200)
Creatinine, Ser: 0.66 mg/dL (ref 0.40–1.20)
Creatinine, Urine: 175.1 mg/dL
Glucose, Bld: 157 mg/dL — ABNORMAL HIGH (ref 70–99)
Hemoglobin: 14.6 g/dL (ref 12.0–15.0)
MCHC: 33.3 g/dL (ref 30.0–36.0)
MCV: 97.6 fL (ref 78.0–100.0)
Microalb, Ur: 1.06 mg/dL (ref 0.00–1.89)
Platelets: 254 10*3/uL (ref 150–400)
Potassium: 3.9 meq/L (ref 3.5–5.3)
RBC: 4.49 M/uL (ref 3.87–5.11)
RDW: 12.8 % (ref 11.5–15.5)
Sodium: 140 meq/L (ref 135–145)
Total Bilirubin: 0.6 mg/dL (ref 0.3–1.2)
Total CHOL/HDL Ratio: 7
Total Protein: 7 g/dL (ref 6.0–8.3)
Triglycerides: 650 mg/dL — ABNORMAL HIGH (ref <150)
WBC: 6.2 10*3/uL (ref 4.0–10.5)

## 2007-02-11 ENCOUNTER — Ambulatory Visit: Payer: Self-pay | Admitting: Cardiology

## 2007-03-08 ENCOUNTER — Ambulatory Visit: Payer: Self-pay | Admitting: Hospitalist

## 2007-03-08 ENCOUNTER — Encounter (INDEPENDENT_AMBULATORY_CARE_PROVIDER_SITE_OTHER): Payer: Self-pay | Admitting: Internal Medicine

## 2007-03-09 LAB — CONVERTED CEMR LAB
ALT: 44 units/L — ABNORMAL HIGH (ref 0–35)
Albumin: 4.3 g/dL (ref 3.5–5.2)
Alkaline Phosphatase: 103 units/L (ref 39–117)
CO2: 27 meq/L (ref 19–32)
Calcium: 9.9 mg/dL (ref 8.4–10.5)
Chloride: 100 meq/L (ref 96–112)
Creatinine, Ser: 0.64 mg/dL (ref 0.40–1.20)
Glucose, Bld: 145 mg/dL — ABNORMAL HIGH (ref 70–99)
Potassium: 4 meq/L (ref 3.5–5.3)
Total Bilirubin: 0.5 mg/dL (ref 0.3–1.2)
Total Protein: 7 g/dL (ref 6.0–8.3)

## 2007-03-15 ENCOUNTER — Ambulatory Visit: Payer: Self-pay | Admitting: Cardiology

## 2007-04-13 ENCOUNTER — Ambulatory Visit: Payer: Self-pay | Admitting: Internal Medicine

## 2007-04-19 ENCOUNTER — Telehealth (INDEPENDENT_AMBULATORY_CARE_PROVIDER_SITE_OTHER): Payer: Self-pay | Admitting: Internal Medicine

## 2007-05-12 ENCOUNTER — Ambulatory Visit: Payer: Self-pay | Admitting: Cardiovascular Disease

## 2007-06-01 ENCOUNTER — Encounter (INDEPENDENT_AMBULATORY_CARE_PROVIDER_SITE_OTHER): Payer: Self-pay | Admitting: Internal Medicine

## 2007-06-01 ENCOUNTER — Ambulatory Visit: Payer: Self-pay | Admitting: *Deleted

## 2007-06-01 DIAGNOSIS — G459 Transient cerebral ischemic attack, unspecified: Secondary | ICD-10-CM | POA: Insufficient documentation

## 2007-06-01 LAB — CONVERTED CEMR LAB
Blood Glucose, Fingerstick: 116
Hgb A1c MFr Bld: 7.6 %

## 2007-06-07 ENCOUNTER — Ambulatory Visit: Payer: Self-pay | Admitting: Vascular Surgery

## 2007-06-07 ENCOUNTER — Encounter (INDEPENDENT_AMBULATORY_CARE_PROVIDER_SITE_OTHER): Payer: Self-pay | Admitting: *Deleted

## 2007-06-07 ENCOUNTER — Ambulatory Visit (HOSPITAL_COMMUNITY): Admission: RE | Admit: 2007-06-07 | Discharge: 2007-06-07 | Payer: Self-pay | Admitting: *Deleted

## 2007-06-09 ENCOUNTER — Encounter (INDEPENDENT_AMBULATORY_CARE_PROVIDER_SITE_OTHER): Payer: Self-pay | Admitting: Internal Medicine

## 2007-06-09 ENCOUNTER — Ambulatory Visit: Payer: Self-pay | Admitting: Internal Medicine

## 2007-06-09 LAB — CONVERTED CEMR LAB
ALT: 66 units/L — ABNORMAL HIGH (ref 0–35)
AST: 70 units/L — ABNORMAL HIGH (ref 0–37)
Albumin: 4.2 g/dL (ref 3.5–5.2)
Alkaline Phosphatase: 84 units/L (ref 39–117)
BUN: 10 mg/dL (ref 6–23)
Calcium: 9.6 mg/dL (ref 8.4–10.5)
Chloride: 101 meq/L (ref 96–112)
Creatinine, Ser: 0.6 mg/dL (ref 0.40–1.20)
Glucose, Bld: 97 mg/dL (ref 70–99)
Potassium: 4 meq/L (ref 3.5–5.3)
Sodium: 140 meq/L (ref 135–145)
Total Bilirubin: 0.8 mg/dL (ref 0.3–1.2)
Total Protein: 7 g/dL (ref 6.0–8.3)

## 2007-06-14 ENCOUNTER — Ambulatory Visit (HOSPITAL_COMMUNITY): Admission: RE | Admit: 2007-06-14 | Discharge: 2007-06-14 | Payer: Self-pay | Admitting: Internal Medicine

## 2007-06-16 ENCOUNTER — Ambulatory Visit: Payer: Self-pay | Admitting: Infectious Disease

## 2007-06-16 ENCOUNTER — Encounter (INDEPENDENT_AMBULATORY_CARE_PROVIDER_SITE_OTHER): Payer: Self-pay | Admitting: Internal Medicine

## 2007-06-20 LAB — CONVERTED CEMR LAB
BUN: 11 mg/dL (ref 6–23)
CO2: 30 meq/L (ref 19–32)
Calcium: 9.9 mg/dL (ref 8.4–10.5)
Chloride: 103 meq/L (ref 96–112)
Creatinine, Ser: 0.8 mg/dL (ref 0.40–1.20)
Glucose, Bld: 108 mg/dL — ABNORMAL HIGH (ref 70–99)
HDL: 37 mg/dL — ABNORMAL LOW (ref >39)
LDL Cholesterol: 105 mg/dL — ABNORMAL HIGH (ref 0–99)
Potassium: 4.3 meq/L (ref 3.5–5.3)
Sodium: 144 meq/L (ref 135–145)
Total CHOL/HDL Ratio: 4.9
Triglycerides: 192 mg/dL — ABNORMAL HIGH (ref <150)
VLDL: 38 mg/dL (ref 0–40)

## 2007-06-30 ENCOUNTER — Ambulatory Visit: Payer: Self-pay | Admitting: Infectious Disease

## 2007-06-30 DIAGNOSIS — K7689 Other specified diseases of liver: Secondary | ICD-10-CM | POA: Insufficient documentation

## 2007-06-30 DIAGNOSIS — E785 Hyperlipidemia, unspecified: Secondary | ICD-10-CM | POA: Insufficient documentation

## 2007-07-07 ENCOUNTER — Ambulatory Visit: Payer: Self-pay | Admitting: Cardiology

## 2007-07-08 ENCOUNTER — Telehealth (INDEPENDENT_AMBULATORY_CARE_PROVIDER_SITE_OTHER): Payer: Self-pay | Admitting: Internal Medicine

## 2007-07-21 ENCOUNTER — Ambulatory Visit: Payer: Self-pay | Admitting: Internal Medicine

## 2007-07-25 ENCOUNTER — Ambulatory Visit: Payer: Self-pay | Admitting: *Deleted

## 2007-07-25 LAB — CONVERTED CEMR LAB
Blood Glucose, Fingerstick: 227
Blood Glucose, Home Monitor: 2 mg/dL

## 2007-08-04 ENCOUNTER — Ambulatory Visit: Payer: Self-pay | Admitting: Internal Medicine

## 2007-08-04 ENCOUNTER — Encounter (INDEPENDENT_AMBULATORY_CARE_PROVIDER_SITE_OTHER): Payer: Self-pay | Admitting: Internal Medicine

## 2007-08-05 LAB — CONVERTED CEMR LAB
ALT: 67 units/L — ABNORMAL HIGH (ref 0–35)
AST: 45 units/L — ABNORMAL HIGH (ref 0–37)
Albumin: 4.3 g/dL (ref 3.5–5.2)
Alkaline Phosphatase: 81 units/L (ref 39–117)
BUN: 10 mg/dL (ref 6–23)
Calcium: 9.7 mg/dL (ref 8.4–10.5)
Creatinine, Ser: 0.74 mg/dL (ref 0.40–1.20)
Sodium: 142 meq/L (ref 135–145)
Total Bilirubin: 0.7 mg/dL (ref 0.3–1.2)
Total Protein: 7.2 g/dL (ref 6.0–8.3)

## 2007-08-09 ENCOUNTER — Encounter (INDEPENDENT_AMBULATORY_CARE_PROVIDER_SITE_OTHER): Payer: Self-pay | Admitting: *Deleted

## 2007-08-09 ENCOUNTER — Ambulatory Visit: Payer: Self-pay | Admitting: Internal Medicine

## 2007-08-11 ENCOUNTER — Ambulatory Visit: Payer: Self-pay | Admitting: Internal Medicine

## 2007-08-12 ENCOUNTER — Emergency Department (HOSPITAL_COMMUNITY): Admission: EM | Admit: 2007-08-12 | Discharge: 2007-08-12 | Payer: Self-pay | Admitting: Emergency Medicine

## 2007-08-30 ENCOUNTER — Ambulatory Visit: Payer: Self-pay | Admitting: Internal Medicine

## 2007-08-30 LAB — CONVERTED CEMR LAB: Hgb A1c MFr Bld: 6.4 %

## 2007-09-05 ENCOUNTER — Telehealth (INDEPENDENT_AMBULATORY_CARE_PROVIDER_SITE_OTHER): Payer: Self-pay | Admitting: Internal Medicine

## 2007-09-05 ENCOUNTER — Ambulatory Visit: Payer: Self-pay | Admitting: Internal Medicine

## 2007-09-05 ENCOUNTER — Encounter: Payer: Self-pay | Admitting: Internal Medicine

## 2007-09-08 ENCOUNTER — Ambulatory Visit: Payer: Self-pay | Admitting: Cardiology

## 2007-09-12 ENCOUNTER — Telehealth: Payer: Self-pay | Admitting: Internal Medicine

## 2007-09-30 ENCOUNTER — Ambulatory Visit: Payer: Self-pay | Admitting: Cardiology

## 2007-10-05 ENCOUNTER — Ambulatory Visit: Payer: Self-pay | Admitting: Internal Medicine

## 2007-10-11 ENCOUNTER — Ambulatory Visit: Payer: Self-pay

## 2007-10-14 ENCOUNTER — Ambulatory Visit: Payer: Self-pay | Admitting: Cardiology

## 2007-10-19 ENCOUNTER — Ambulatory Visit (HOSPITAL_COMMUNITY): Admission: RE | Admit: 2007-10-19 | Discharge: 2007-10-19 | Payer: Self-pay | Admitting: Internal Medicine

## 2007-10-27 ENCOUNTER — Encounter (INDEPENDENT_AMBULATORY_CARE_PROVIDER_SITE_OTHER): Payer: Self-pay | Admitting: *Deleted

## 2007-10-28 ENCOUNTER — Ambulatory Visit: Payer: Self-pay | Admitting: Internal Medicine

## 2007-10-28 ENCOUNTER — Encounter: Admission: RE | Admit: 2007-10-28 | Discharge: 2007-10-28 | Payer: Self-pay | Admitting: Internal Medicine

## 2007-11-25 ENCOUNTER — Ambulatory Visit: Payer: Self-pay | Admitting: Cardiology

## 2007-12-09 ENCOUNTER — Ambulatory Visit: Payer: Self-pay | Admitting: Cardiovascular Disease

## 2007-12-12 ENCOUNTER — Telehealth: Payer: Self-pay | Admitting: *Deleted

## 2007-12-15 ENCOUNTER — Ambulatory Visit: Payer: Self-pay | Admitting: Infectious Disease

## 2007-12-15 ENCOUNTER — Encounter (INDEPENDENT_AMBULATORY_CARE_PROVIDER_SITE_OTHER): Payer: Self-pay | Admitting: Internal Medicine

## 2007-12-15 DIAGNOSIS — G3184 Mild cognitive impairment, so stated: Secondary | ICD-10-CM | POA: Insufficient documentation

## 2007-12-15 LAB — CONVERTED CEMR LAB
ALT: 81 units/L — ABNORMAL HIGH (ref 0–35)
Albumin: 4.3 g/dL (ref 3.5–5.2)
BUN: 11 mg/dL (ref 6–23)
CO2: 26 meq/L (ref 19–32)
Calcium: 10.3 mg/dL (ref 8.4–10.5)
Chloride: 99 meq/L (ref 96–112)
Cholesterol: 200 mg/dL (ref 0–200)
Creatinine, Ser: 0.61 mg/dL (ref 0.40–1.20)
Glucose, Bld: 165 mg/dL — ABNORMAL HIGH (ref 70–99)
HCT: 44.9 % (ref 36.0–46.0)
Hemoglobin: 14.6 g/dL (ref 12.0–15.0)
Hgb A1c MFr Bld: 7.1 %
MCHC: 32.5 g/dL (ref 30.0–36.0)
MCV: 98.9 fL (ref 78.0–100.0)
Microalb Creat Ratio: 23.8 mg/g (ref 0.0–30.0)
Microalb, Ur: 0.2 mg/dL (ref 0.00–1.89)
Platelets: 277 10*3/uL (ref 150–400)
Potassium: 4 meq/L (ref 3.5–5.3)
Total Bilirubin: 0.7 mg/dL (ref 0.3–1.2)
Total CHOL/HDL Ratio: 5.1
Total Protein: 7.1 g/dL (ref 6.0–8.3)
Triglycerides: 414 mg/dL — ABNORMAL HIGH (ref <150)
Vitamin B-12: 768 pg/mL (ref 211–911)
WBC: 7.1 10*3/uL (ref 4.0–10.5)

## 2007-12-18 ENCOUNTER — Emergency Department (HOSPITAL_BASED_OUTPATIENT_CLINIC_OR_DEPARTMENT_OTHER): Admission: EM | Admit: 2007-12-18 | Discharge: 2007-12-18 | Payer: Self-pay | Admitting: Emergency Medicine

## 2007-12-29 ENCOUNTER — Ambulatory Visit (HOSPITAL_COMMUNITY): Admission: RE | Admit: 2007-12-29 | Discharge: 2007-12-29 | Payer: Self-pay | Admitting: Internal Medicine

## 2008-01-06 ENCOUNTER — Ambulatory Visit: Payer: Self-pay | Admitting: Internal Medicine

## 2008-01-19 ENCOUNTER — Telehealth: Payer: Self-pay | Admitting: *Deleted

## 2008-02-07 ENCOUNTER — Telehealth (INDEPENDENT_AMBULATORY_CARE_PROVIDER_SITE_OTHER): Payer: Self-pay | Admitting: Internal Medicine

## 2008-02-13 ENCOUNTER — Telehealth (INDEPENDENT_AMBULATORY_CARE_PROVIDER_SITE_OTHER): Payer: Self-pay | Admitting: Internal Medicine

## 2008-03-05 ENCOUNTER — Ambulatory Visit: Payer: Self-pay | Admitting: Cardiology

## 2008-03-13 ENCOUNTER — Ambulatory Visit: Payer: Self-pay | Admitting: Infectious Disease

## 2008-03-13 LAB — CONVERTED CEMR LAB
Bilirubin Urine: NEGATIVE
Blood Glucose, Fingerstick: 136
Blood in Urine, dipstick: NEGATIVE
Glucose, Urine, Semiquant: NEGATIVE
Hgb A1c MFr Bld: 6.2 %
Ketones, urine, test strip: NEGATIVE
Protein, U semiquant: NEGATIVE
Specific Gravity, Urine: 1.01
Urobilinogen, UA: 0.2
pH: 5.5

## 2008-03-14 ENCOUNTER — Encounter (INDEPENDENT_AMBULATORY_CARE_PROVIDER_SITE_OTHER): Payer: Self-pay | Admitting: Internal Medicine

## 2008-03-14 LAB — CONVERTED CEMR LAB
Bilirubin Urine: NEGATIVE
Hemoglobin, Urine: NEGATIVE
Ketones, ur: NEGATIVE mg/dL
Nitrite: POSITIVE — AB
Protein, ur: NEGATIVE mg/dL
RBC / HPF: NONE SEEN (ref <3)
Specific Gravity, Urine: 1.008 (ref 1.005–1.03)
Urine Glucose: NEGATIVE mg/dL
Urobilinogen, UA: 0.2 (ref 0.0–1.0)
pH: 6 (ref 5.0–8.0)

## 2008-03-16 ENCOUNTER — Ambulatory Visit: Payer: Self-pay | Admitting: Cardiology

## 2008-03-16 ENCOUNTER — Encounter (INDEPENDENT_AMBULATORY_CARE_PROVIDER_SITE_OTHER): Payer: Self-pay | Admitting: Internal Medicine

## 2008-03-16 ENCOUNTER — Ambulatory Visit: Payer: Self-pay | Admitting: Infectious Disease

## 2008-03-20 ENCOUNTER — Ambulatory Visit (HOSPITAL_COMMUNITY): Admission: RE | Admit: 2008-03-20 | Discharge: 2008-03-20 | Payer: Self-pay | Admitting: Internal Medicine

## 2008-03-20 ENCOUNTER — Encounter (INDEPENDENT_AMBULATORY_CARE_PROVIDER_SITE_OTHER): Payer: Self-pay | Admitting: Internal Medicine

## 2008-03-26 LAB — CONVERTED CEMR LAB
ALT: 49 units/L — ABNORMAL HIGH (ref 0–35)
AST: 35 units/L (ref 0–37)
Albumin: 4 g/dL (ref 3.5–5.2)
Alkaline Phosphatase: 73 units/L (ref 39–117)
BUN: 11 mg/dL (ref 6–23)
CO2: 26 meq/L (ref 19–32)
Calcium: 9.3 mg/dL (ref 8.4–10.5)
Chloride: 105 meq/L (ref 96–112)
Creatinine, Ser: 0.67 mg/dL (ref 0.40–1.20)
Glucose, Bld: 124 mg/dL — ABNORMAL HIGH (ref 70–99)
HDL: 35 mg/dL — ABNORMAL LOW (ref >39)
LDL Cholesterol: 87 mg/dL (ref 0–99)
Potassium: 4.7 meq/L (ref 3.5–5.3)
Sodium: 142 meq/L (ref 135–145)
Total Bilirubin: 0.7 mg/dL (ref 0.3–1.2)
Total CHOL/HDL Ratio: 5.7
Total Protein: 6.4 g/dL (ref 6.0–8.3)
Triglycerides: 384 mg/dL — ABNORMAL HIGH (ref <150)
VLDL: 77 mg/dL — ABNORMAL HIGH (ref 0–40)

## 2008-04-02 ENCOUNTER — Ambulatory Visit: Payer: Self-pay | Admitting: Cardiology

## 2008-04-30 ENCOUNTER — Ambulatory Visit: Payer: Self-pay | Admitting: Cardiology

## 2008-05-14 ENCOUNTER — Ambulatory Visit: Payer: Self-pay | Admitting: Cardiology

## 2008-05-28 ENCOUNTER — Ambulatory Visit: Payer: Self-pay | Admitting: Cardiology

## 2008-06-12 ENCOUNTER — Ambulatory Visit: Payer: Self-pay | Admitting: Cardiovascular Disease

## 2008-06-13 ENCOUNTER — Telehealth (INDEPENDENT_AMBULATORY_CARE_PROVIDER_SITE_OTHER): Payer: Self-pay | Admitting: Internal Medicine

## 2008-06-18 ENCOUNTER — Ambulatory Visit: Payer: Self-pay | Admitting: Internal Medicine

## 2008-06-22 DIAGNOSIS — M8580 Other specified disorders of bone density and structure, unspecified site: Secondary | ICD-10-CM | POA: Insufficient documentation

## 2008-07-02 ENCOUNTER — Encounter (INDEPENDENT_AMBULATORY_CARE_PROVIDER_SITE_OTHER): Payer: Self-pay | Admitting: Internal Medicine

## 2008-07-02 ENCOUNTER — Ambulatory Visit: Payer: Self-pay | Admitting: Cardiology

## 2008-07-02 ENCOUNTER — Ambulatory Visit: Payer: Self-pay | Admitting: Internal Medicine

## 2008-07-02 LAB — CONVERTED CEMR LAB
HCT: 42.2 % (ref 36.0–46.0)
Hemoglobin: 14.7 g/dL (ref 12.0–15.0)
Hgb A1c MFr Bld: 5.5 %
MCHC: 34.8 g/dL (ref 30.0–36.0)
Platelets: 283 10*3/uL (ref 150–400)
RBC: 4.58 M/uL (ref 3.87–5.11)
RDW: 13.2 % (ref 11.5–15.5)
WBC: 8.1 10*3/uL (ref 4.0–10.5)

## 2008-07-03 ENCOUNTER — Encounter (INDEPENDENT_AMBULATORY_CARE_PROVIDER_SITE_OTHER): Payer: Self-pay | Admitting: Internal Medicine

## 2008-07-09 ENCOUNTER — Telehealth (INDEPENDENT_AMBULATORY_CARE_PROVIDER_SITE_OTHER): Payer: Self-pay | Admitting: Internal Medicine

## 2008-07-09 ENCOUNTER — Ambulatory Visit: Payer: Self-pay | Admitting: Internal Medicine

## 2008-07-09 ENCOUNTER — Encounter (INDEPENDENT_AMBULATORY_CARE_PROVIDER_SITE_OTHER): Payer: Self-pay | Admitting: Internal Medicine

## 2008-07-26 LAB — CONVERTED CEMR LAB
AST: 27 units/L (ref 0–37)
Albumin: 3.8 g/dL (ref 3.5–5.2)
Alkaline Phosphatase: 89 units/L (ref 39–117)
BUN: 8 mg/dL (ref 6–23)
CO2: 27 meq/L (ref 19–32)
Calcium: 9.8 mg/dL (ref 8.4–10.5)
Chloride: 104 meq/L (ref 96–112)
Cholesterol: 193 mg/dL (ref 0–200)
GFR calc Af Amer: >60 mL/min (ref >60)
GFR calc non Af Amer: >60 mL/min (ref >60)
Glucose, Bld: 119 mg/dL — ABNORMAL HIGH (ref 70–99)
HDL: 33 mg/dL — ABNORMAL LOW (ref >39)
Potassium: 4.3 meq/L (ref 3.5–5.3)
Sodium: 142 meq/L (ref 135–145)
TSH: 1.002 microintl units/mL (ref 0.350–4.500)
Total Bilirubin: 0.4 mg/dL (ref 0.3–1.2)
Total CHOL/HDL Ratio: 5.8
Total Protein: 6.3 g/dL (ref 6.0–8.3)
Triglycerides: 534 mg/dL — ABNORMAL HIGH (ref <150)

## 2008-07-30 ENCOUNTER — Ambulatory Visit: Payer: Self-pay | Admitting: Cardiovascular Disease

## 2008-08-07 ENCOUNTER — Encounter: Payer: Self-pay | Admitting: *Deleted

## 2008-08-09 ENCOUNTER — Telehealth (INDEPENDENT_AMBULATORY_CARE_PROVIDER_SITE_OTHER): Payer: Self-pay | Admitting: Internal Medicine

## 2008-08-20 ENCOUNTER — Ambulatory Visit: Payer: Self-pay | Admitting: Cardiology

## 2008-08-20 LAB — CONVERTED CEMR LAB
POC INR: 2.4
Protime: 18.8

## 2008-08-24 ENCOUNTER — Encounter (INDEPENDENT_AMBULATORY_CARE_PROVIDER_SITE_OTHER): Payer: Self-pay | Admitting: Internal Medicine

## 2008-08-24 ENCOUNTER — Ambulatory Visit: Payer: Self-pay | Admitting: Internal Medicine

## 2008-08-24 DIAGNOSIS — R5383 Other fatigue: Secondary | ICD-10-CM | POA: Insufficient documentation

## 2008-08-24 DIAGNOSIS — R5381 Other malaise: Secondary | ICD-10-CM | POA: Insufficient documentation

## 2008-08-24 LAB — CONVERTED CEMR LAB: Blood Glucose, Fingerstick: 145

## 2008-08-29 ENCOUNTER — Telehealth (INDEPENDENT_AMBULATORY_CARE_PROVIDER_SITE_OTHER): Payer: Self-pay | Admitting: Internal Medicine

## 2008-08-29 DIAGNOSIS — R740 Nonspecific elevation of levels of transaminase and lactic acid dehydrogenase [LDH]: Secondary | ICD-10-CM

## 2008-08-29 DIAGNOSIS — R7402 Elevation of levels of lactic acid dehydrogenase (LDH): Secondary | ICD-10-CM | POA: Insufficient documentation

## 2008-08-30 ENCOUNTER — Ambulatory Visit: Payer: Self-pay | Admitting: Internal Medicine

## 2008-08-30 LAB — CONVERTED CEMR LAB
Albumin: 3.9 g/dL (ref 3.5–5.2)
Alkaline Phosphatase: 75 units/L (ref 39–117)
Indirect Bilirubin: 0.5 mg/dL (ref 0.0–0.9)
Total Bilirubin: 0.7 mg/dL (ref 0.3–1.2)
Total Protein: 6.7 g/dL (ref 6.0–8.3)

## 2008-09-03 LAB — CONVERTED CEMR LAB
ALT: 74 units/L — ABNORMAL HIGH (ref 0–35)
AST: 78 units/L — ABNORMAL HIGH (ref 0–37)
Alkaline Phosphatase: 70 units/L (ref 39–117)
BUN: 12 mg/dL (ref 6–23)
Chloride: 103 meq/L (ref 96–112)
GFR calc non Af Amer: >60 mL/min (ref >60)
Glucose, Bld: 140 mg/dL — ABNORMAL HIGH (ref 70–99)
HCT: 44.2 % (ref 36.0–46.0)
HDL: 35 mg/dL — ABNORMAL LOW (ref >39)
Hemoglobin: 14.2 g/dL (ref 12.0–15.0)
LDL Cholesterol: 63 mg/dL (ref 0–99)
MCV: 101.4 fL — ABNORMAL HIGH (ref 78.0–100.0)
Platelets: 249 10*3/uL (ref 150–400)
Potassium: 4.6 meq/L (ref 3.5–5.3)
RBC: 4.36 M/uL (ref 3.87–5.11)
RDW: 13.2 % (ref 11.5–15.5)
Sodium: 141 meq/L (ref 135–145)
TSH: 1.442 microintl units/mL (ref 0.350–4.500)
Total Bilirubin: 1 mg/dL (ref 0.3–1.2)
Total CHOL/HDL Ratio: 4.7
Total Protein: 7 g/dL (ref 6.0–8.3)
Triglycerides: 327 mg/dL — ABNORMAL HIGH (ref <150)

## 2008-09-06 ENCOUNTER — Telehealth: Payer: Self-pay | Admitting: *Deleted

## 2008-09-12 ENCOUNTER — Encounter: Payer: Self-pay | Admitting: *Deleted

## 2008-09-12 DIAGNOSIS — K2940 Chronic atrophic gastritis without bleeding: Secondary | ICD-10-CM | POA: Insufficient documentation

## 2008-09-12 DIAGNOSIS — K219 Gastro-esophageal reflux disease without esophagitis: Secondary | ICD-10-CM | POA: Insufficient documentation

## 2008-09-14 ENCOUNTER — Ambulatory Visit: Payer: Self-pay | Admitting: Cardiology

## 2008-09-14 ENCOUNTER — Ambulatory Visit: Payer: Self-pay | Admitting: Gastroenterology

## 2008-09-14 ENCOUNTER — Encounter (INDEPENDENT_AMBULATORY_CARE_PROVIDER_SITE_OTHER): Payer: Self-pay | Admitting: Cardiology

## 2008-09-14 DIAGNOSIS — K648 Other hemorrhoids: Secondary | ICD-10-CM | POA: Insufficient documentation

## 2008-09-14 DIAGNOSIS — K625 Hemorrhage of anus and rectum: Secondary | ICD-10-CM | POA: Insufficient documentation

## 2008-09-14 DIAGNOSIS — IMO0001 Reserved for inherently not codable concepts without codable children: Secondary | ICD-10-CM | POA: Insufficient documentation

## 2008-09-14 LAB — CONVERTED CEMR LAB
Anti Nuclear Antibody(ANA): NEGATIVE
Ceruloplasmin: 28 mg/dL (ref 21–63)
HCV Ab: NEGATIVE
Hepatitis B Surface Ag: NEGATIVE
Iron: 123 ug/dL (ref 42–145)
POC INR: 2.3
Prothrombin Time: 18.4 s
Sed Rate: 22 mm/hr (ref 0–22)
Tissue Transglutaminase Ab, IgA: 1 units (ref <7)
Transferrin: 292.8 mg/dL (ref 212.0–360.0)
Vitamin B-12: 670 pg/mL (ref 211–911)

## 2008-09-27 ENCOUNTER — Telehealth: Payer: Self-pay | Admitting: Cardiology

## 2008-10-03 ENCOUNTER — Telehealth (INDEPENDENT_AMBULATORY_CARE_PROVIDER_SITE_OTHER): Payer: Self-pay | Admitting: *Deleted

## 2008-10-15 ENCOUNTER — Ambulatory Visit: Payer: Self-pay | Admitting: Cardiology

## 2008-10-15 LAB — CONVERTED CEMR LAB
POC INR: 1.4
Prothrombin Time: 14.4 s

## 2008-10-22 ENCOUNTER — Telehealth: Payer: Self-pay | Admitting: *Deleted

## 2008-10-22 ENCOUNTER — Ambulatory Visit: Payer: Self-pay | Admitting: Gastroenterology

## 2008-10-26 ENCOUNTER — Ambulatory Visit: Payer: Self-pay | Admitting: Cardiology

## 2008-10-26 LAB — CONVERTED CEMR LAB: POC INR: 1.6

## 2008-11-06 ENCOUNTER — Ambulatory Visit: Payer: Self-pay | Admitting: Cardiology

## 2008-11-06 DIAGNOSIS — E669 Obesity, unspecified: Secondary | ICD-10-CM | POA: Insufficient documentation

## 2008-11-06 DIAGNOSIS — I6529 Occlusion and stenosis of unspecified carotid artery: Secondary | ICD-10-CM | POA: Insufficient documentation

## 2008-11-06 LAB — CONVERTED CEMR LAB: POC INR: 3.7

## 2008-11-07 ENCOUNTER — Telehealth (INDEPENDENT_AMBULATORY_CARE_PROVIDER_SITE_OTHER): Payer: Self-pay | Admitting: Internal Medicine

## 2008-11-19 ENCOUNTER — Ambulatory Visit (HOSPITAL_COMMUNITY): Admission: RE | Admit: 2008-11-19 | Discharge: 2008-11-19 | Payer: Self-pay | Admitting: Internal Medicine

## 2008-11-22 ENCOUNTER — Telehealth (INDEPENDENT_AMBULATORY_CARE_PROVIDER_SITE_OTHER): Payer: Self-pay | Admitting: Internal Medicine

## 2008-11-27 ENCOUNTER — Ambulatory Visit: Payer: Self-pay

## 2008-11-27 ENCOUNTER — Ambulatory Visit: Payer: Self-pay | Admitting: Cardiology

## 2008-11-27 LAB — CONVERTED CEMR LAB: POC INR: 2.1

## 2008-12-04 ENCOUNTER — Ambulatory Visit: Payer: Self-pay | Admitting: Infectious Diseases

## 2008-12-04 LAB — CONVERTED CEMR LAB
Blood Glucose, Fingerstick: 172
Hgb A1c MFr Bld: 6.1 %

## 2008-12-18 ENCOUNTER — Ambulatory Visit: Payer: Self-pay | Admitting: Cardiology

## 2008-12-18 LAB — CONVERTED CEMR LAB: POC INR: 2.5

## 2009-01-01 ENCOUNTER — Ambulatory Visit: Payer: Self-pay | Admitting: Internal Medicine

## 2009-01-01 DIAGNOSIS — M21619 Bunion of unspecified foot: Secondary | ICD-10-CM | POA: Insufficient documentation

## 2009-01-08 ENCOUNTER — Telehealth (INDEPENDENT_AMBULATORY_CARE_PROVIDER_SITE_OTHER): Payer: Self-pay | Admitting: Internal Medicine

## 2009-01-22 ENCOUNTER — Telehealth (INDEPENDENT_AMBULATORY_CARE_PROVIDER_SITE_OTHER): Payer: Self-pay | Admitting: Internal Medicine

## 2009-01-23 ENCOUNTER — Encounter (INDEPENDENT_AMBULATORY_CARE_PROVIDER_SITE_OTHER): Payer: Self-pay | Admitting: Internal Medicine

## 2009-01-28 ENCOUNTER — Ambulatory Visit: Payer: Self-pay | Admitting: Cardiovascular Disease

## 2009-01-28 LAB — CONVERTED CEMR LAB: POC INR: 2.1

## 2009-02-25 ENCOUNTER — Ambulatory Visit: Payer: Self-pay | Admitting: Cardiology

## 2009-03-13 ENCOUNTER — Telehealth (INDEPENDENT_AMBULATORY_CARE_PROVIDER_SITE_OTHER): Payer: Self-pay | Admitting: Internal Medicine

## 2009-03-25 ENCOUNTER — Ambulatory Visit: Payer: Self-pay | Admitting: Cardiology

## 2009-03-26 ENCOUNTER — Ambulatory Visit: Payer: Self-pay | Admitting: Cardiology

## 2009-03-26 DIAGNOSIS — R079 Chest pain, unspecified: Secondary | ICD-10-CM | POA: Insufficient documentation

## 2009-04-22 ENCOUNTER — Ambulatory Visit: Payer: Self-pay | Admitting: Cardiology

## 2009-04-22 LAB — CONVERTED CEMR LAB: POC INR: 3.4

## 2009-04-26 ENCOUNTER — Encounter: Payer: Self-pay | Admitting: Cardiology

## 2009-05-09 ENCOUNTER — Ambulatory Visit: Payer: Self-pay | Admitting: Internal Medicine

## 2009-05-09 DIAGNOSIS — F32A Depression, unspecified: Secondary | ICD-10-CM | POA: Insufficient documentation

## 2009-05-09 DIAGNOSIS — F329 Major depressive disorder, single episode, unspecified: Secondary | ICD-10-CM

## 2009-05-09 LAB — CONVERTED CEMR LAB
Blood Glucose, Fingerstick: 217
Hgb A1c MFr Bld: 6.4 %

## 2009-05-10 LAB — CONVERTED CEMR LAB
ALT: 37 units/L — ABNORMAL HIGH (ref 0–35)
AST: 29 units/L (ref 0–37)
Albumin: 3.8 g/dL (ref 3.5–5.2)
Alkaline Phosphatase: 82 units/L (ref 39–117)
BUN: 11 mg/dL (ref 6–23)
CO2: 30 meq/L (ref 19–32)
Calcium: 9.2 mg/dL (ref 8.4–10.5)
Chloride: 99 meq/L (ref 96–112)
Creatinine, Ser: 0.71 mg/dL (ref 0.40–1.20)
Glucose, Bld: 211 mg/dL — ABNORMAL HIGH (ref 70–99)
HDL: 36 mg/dL — ABNORMAL LOW (ref >39)
Potassium: 4 meq/L (ref 3.5–5.3)
Sodium: 136 meq/L (ref 135–145)
Total Bilirubin: 0.6 mg/dL (ref 0.3–1.2)
Total CHOL/HDL Ratio: 4.7
Total Protein: 6.3 g/dL (ref 6.0–8.3)
Triglycerides: 506 mg/dL — ABNORMAL HIGH (ref <150)

## 2009-05-13 ENCOUNTER — Ambulatory Visit: Payer: Self-pay | Admitting: Internal Medicine

## 2009-05-13 LAB — CONVERTED CEMR LAB: POC INR: 2.8

## 2009-05-21 ENCOUNTER — Telehealth (INDEPENDENT_AMBULATORY_CARE_PROVIDER_SITE_OTHER): Payer: Self-pay | Admitting: *Deleted

## 2009-05-22 ENCOUNTER — Telehealth (INDEPENDENT_AMBULATORY_CARE_PROVIDER_SITE_OTHER): Payer: Self-pay | Admitting: Internal Medicine

## 2009-05-28 ENCOUNTER — Ambulatory Visit: Payer: Self-pay | Admitting: Cardiology

## 2009-05-28 ENCOUNTER — Ambulatory Visit: Payer: Self-pay

## 2009-05-29 ENCOUNTER — Telehealth (INDEPENDENT_AMBULATORY_CARE_PROVIDER_SITE_OTHER): Payer: Self-pay | Admitting: Internal Medicine

## 2009-05-30 ENCOUNTER — Telehealth (INDEPENDENT_AMBULATORY_CARE_PROVIDER_SITE_OTHER): Payer: Self-pay | Admitting: *Deleted

## 2009-06-03 ENCOUNTER — Encounter (HOSPITAL_COMMUNITY): Admission: RE | Admit: 2009-06-03 | Discharge: 2009-08-07 | Payer: Self-pay | Admitting: Cardiology

## 2009-06-03 ENCOUNTER — Ambulatory Visit: Payer: Self-pay

## 2009-06-03 ENCOUNTER — Ambulatory Visit: Payer: Self-pay | Admitting: Internal Medicine

## 2009-06-10 ENCOUNTER — Ambulatory Visit: Payer: Self-pay | Admitting: Cardiovascular Disease

## 2009-06-10 LAB — CONVERTED CEMR LAB: POC INR: 2.6

## 2009-06-21 ENCOUNTER — Encounter: Payer: Self-pay | Admitting: Cardiology

## 2009-06-21 ENCOUNTER — Telehealth: Payer: Self-pay | Admitting: Cardiovascular Disease

## 2009-06-24 ENCOUNTER — Encounter: Payer: Self-pay | Admitting: Cardiology

## 2009-07-01 ENCOUNTER — Telehealth: Payer: Self-pay | Admitting: Cardiology

## 2009-07-02 ENCOUNTER — Encounter (INDEPENDENT_AMBULATORY_CARE_PROVIDER_SITE_OTHER): Payer: Self-pay | Admitting: Internal Medicine

## 2009-07-03 ENCOUNTER — Telehealth (INDEPENDENT_AMBULATORY_CARE_PROVIDER_SITE_OTHER): Payer: Self-pay | Admitting: Internal Medicine

## 2009-07-12 ENCOUNTER — Ambulatory Visit: Payer: Self-pay | Admitting: Internal Medicine

## 2009-07-22 ENCOUNTER — Ambulatory Visit: Payer: Self-pay | Admitting: Infectious Disease

## 2009-07-24 LAB — CONVERTED CEMR LAB
ALT: 43 units/L — ABNORMAL HIGH (ref 0–35)
AST: 38 units/L — ABNORMAL HIGH (ref 0–37)
Albumin: 4.3 g/dL (ref 3.5–5.2)
Alkaline Phosphatase: 68 units/L (ref 39–117)
Calcium: 9.9 mg/dL (ref 8.4–10.5)
Cholesterol: 129 mg/dL (ref 0–200)
Glucose, Bld: 168 mg/dL — ABNORMAL HIGH (ref 70–99)
HDL: 37 mg/dL — ABNORMAL LOW (ref >39)
Potassium: 4 meq/L (ref 3.5–5.3)
Sodium: 141 meq/L (ref 135–145)
Total Bilirubin: 0.8 mg/dL (ref 0.3–1.2)
Total CHOL/HDL Ratio: 3.5
Total Protein: 6.8 g/dL (ref 6.0–8.3)
Triglycerides: 427 mg/dL — ABNORMAL HIGH (ref <150)

## 2009-07-26 ENCOUNTER — Telehealth (INDEPENDENT_AMBULATORY_CARE_PROVIDER_SITE_OTHER): Payer: Self-pay | Admitting: Internal Medicine

## 2009-08-09 ENCOUNTER — Ambulatory Visit: Payer: Self-pay | Admitting: Cardiovascular Disease

## 2009-08-09 LAB — CONVERTED CEMR LAB: POC INR: 2.9

## 2009-09-03 ENCOUNTER — Encounter: Payer: Self-pay | Admitting: Internal Medicine

## 2009-09-05 ENCOUNTER — Ambulatory Visit: Payer: Self-pay | Admitting: Cardiology

## 2009-09-05 LAB — CONVERTED CEMR LAB: POC INR: 2.7

## 2009-09-25 ENCOUNTER — Telehealth: Payer: Self-pay | Admitting: Internal Medicine

## 2009-10-03 ENCOUNTER — Ambulatory Visit: Payer: Self-pay | Admitting: Internal Medicine

## 2009-10-03 LAB — CONVERTED CEMR LAB: POC INR: 3.4

## 2009-10-09 ENCOUNTER — Telehealth: Payer: Self-pay | Admitting: Internal Medicine

## 2009-10-14 ENCOUNTER — Encounter: Payer: Self-pay | Admitting: Cardiology

## 2009-10-18 ENCOUNTER — Ambulatory Visit: Payer: Self-pay | Admitting: Cardiology

## 2009-10-18 ENCOUNTER — Ambulatory Visit: Payer: Self-pay | Admitting: Internal Medicine

## 2009-10-18 LAB — CONVERTED CEMR LAB: POC INR: 3.8

## 2009-11-01 ENCOUNTER — Ambulatory Visit: Payer: Self-pay | Admitting: Cardiology

## 2009-11-01 LAB — CONVERTED CEMR LAB: POC INR: 3.1

## 2009-11-07 ENCOUNTER — Ambulatory Visit: Payer: Self-pay | Admitting: Internal Medicine

## 2009-11-07 LAB — CONVERTED CEMR LAB
BUN: 7 mg/dL (ref 6–23)
CO2: 30 meq/L (ref 19–32)
Calcium: 10 mg/dL (ref 8.4–10.5)
Chloride: 103 meq/L (ref 96–112)
Creatinine, Ser: 0.68 mg/dL (ref 0.40–1.20)
Glucose, Bld: 149 mg/dL — ABNORMAL HIGH (ref 70–99)
Potassium: 4.7 meq/L (ref 3.5–5.3)
Sodium: 143 meq/L (ref 135–145)

## 2009-11-12 ENCOUNTER — Encounter (INDEPENDENT_AMBULATORY_CARE_PROVIDER_SITE_OTHER): Payer: Self-pay | Admitting: *Deleted

## 2009-11-12 ENCOUNTER — Ambulatory Visit: Payer: Self-pay | Admitting: Gastroenterology

## 2009-11-12 DIAGNOSIS — R1310 Dysphagia, unspecified: Secondary | ICD-10-CM | POA: Insufficient documentation

## 2009-11-15 ENCOUNTER — Ambulatory Visit: Payer: Self-pay | Admitting: Internal Medicine

## 2009-11-15 LAB — CONVERTED CEMR LAB: POC INR: 3.3

## 2009-11-20 ENCOUNTER — Ambulatory Visit: Payer: Self-pay | Admitting: Gastroenterology

## 2009-11-22 ENCOUNTER — Ambulatory Visit (HOSPITAL_COMMUNITY): Admission: RE | Admit: 2009-11-22 | Discharge: 2009-11-22 | Payer: Self-pay | Admitting: Internal Medicine

## 2009-11-28 ENCOUNTER — Telehealth: Payer: Self-pay | Admitting: Internal Medicine

## 2009-11-28 ENCOUNTER — Ambulatory Visit: Payer: Self-pay | Admitting: Cardiology

## 2009-11-28 LAB — CONVERTED CEMR LAB: POC INR: 2.1

## 2009-12-04 ENCOUNTER — Encounter: Payer: Self-pay | Admitting: Cardiology

## 2009-12-17 ENCOUNTER — Ambulatory Visit: Payer: Self-pay

## 2009-12-19 ENCOUNTER — Encounter: Payer: Self-pay | Admitting: Cardiology

## 2009-12-20 ENCOUNTER — Ambulatory Visit: Payer: Self-pay | Admitting: Cardiology

## 2009-12-20 ENCOUNTER — Ambulatory Visit: Payer: Self-pay

## 2009-12-20 ENCOUNTER — Encounter: Payer: Self-pay | Admitting: Cardiology

## 2009-12-20 LAB — CONVERTED CEMR LAB: POC INR: 3

## 2009-12-26 ENCOUNTER — Telehealth: Payer: Self-pay | Admitting: Internal Medicine

## 2009-12-26 ENCOUNTER — Encounter: Payer: Self-pay | Admitting: Cardiology

## 2010-01-17 ENCOUNTER — Ambulatory Visit: Payer: Self-pay | Admitting: Cardiology

## 2010-01-22 ENCOUNTER — Telehealth: Payer: Self-pay | Admitting: Internal Medicine

## 2010-02-04 ENCOUNTER — Telehealth: Payer: Self-pay | Admitting: Internal Medicine

## 2010-02-06 ENCOUNTER — Ambulatory Visit: Payer: Self-pay | Admitting: Internal Medicine

## 2010-02-06 LAB — CONVERTED CEMR LAB
Blood Glucose, Fingerstick: 145
Hgb A1c MFr Bld: 7.4 %

## 2010-02-07 ENCOUNTER — Ambulatory Visit: Payer: Self-pay | Admitting: Internal Medicine

## 2010-02-07 LAB — CONVERTED CEMR LAB
ALT: 43 units/L — ABNORMAL HIGH (ref 0–35)
AST: 44 units/L — ABNORMAL HIGH (ref 0–37)
Albumin: 3.8 g/dL (ref 3.5–5.2)
Alkaline Phosphatase: 74 units/L (ref 39–117)
CO2: 28 meq/L (ref 19–32)
Calcium: 9.7 mg/dL (ref 8.4–10.5)
Chloride: 101 meq/L (ref 96–112)
Cholesterol: 118 mg/dL (ref 0–200)
Glucose, Bld: 142 mg/dL — ABNORMAL HIGH (ref 70–99)
HDL: 31 mg/dL — ABNORMAL LOW (ref >39)
LDL Cholesterol: 43 mg/dL (ref 0–99)
Potassium: 4 meq/L (ref 3.5–5.3)
Sodium: 138 meq/L (ref 135–145)
Total Bilirubin: 1 mg/dL (ref 0.3–1.2)
Total Protein: 6.4 g/dL (ref 6.0–8.3)
Triglycerides: 219 mg/dL — ABNORMAL HIGH (ref <150)
VLDL: 44 mg/dL — ABNORMAL HIGH (ref 0–40)

## 2010-02-14 ENCOUNTER — Telehealth: Payer: Self-pay | Admitting: Internal Medicine

## 2010-02-14 ENCOUNTER — Ambulatory Visit: Payer: Self-pay | Admitting: Internal Medicine

## 2010-02-14 LAB — CONVERTED CEMR LAB: POC INR: 2.7

## 2010-02-20 ENCOUNTER — Encounter: Payer: Self-pay | Admitting: Internal Medicine

## 2010-03-04 ENCOUNTER — Encounter: Payer: Self-pay | Admitting: Internal Medicine

## 2010-03-11 ENCOUNTER — Ambulatory Visit
Admission: RE | Admit: 2010-03-11 | Discharge: 2010-03-11 | Payer: Self-pay | Source: Home / Self Care | Attending: Internal Medicine | Admitting: Internal Medicine

## 2010-03-11 ENCOUNTER — Telehealth: Payer: Self-pay | Admitting: Internal Medicine

## 2010-03-11 DIAGNOSIS — J029 Acute pharyngitis, unspecified: Secondary | ICD-10-CM | POA: Insufficient documentation

## 2010-03-20 ENCOUNTER — Encounter: Payer: Self-pay | Admitting: Internal Medicine

## 2010-03-20 ENCOUNTER — Ambulatory Visit: Admission: RE | Admit: 2010-03-20 | Discharge: 2010-03-20 | Payer: Self-pay | Source: Home / Self Care

## 2010-03-20 LAB — CONVERTED CEMR LAB: POC INR: 3

## 2010-03-25 ENCOUNTER — Telehealth: Payer: Self-pay | Admitting: *Deleted

## 2010-03-27 ENCOUNTER — Ambulatory Visit: Admission: RE | Admit: 2010-03-27 | Discharge: 2010-03-27 | Payer: Self-pay | Source: Home / Self Care

## 2010-03-27 ENCOUNTER — Ambulatory Visit (HOSPITAL_COMMUNITY)
Admission: RE | Admit: 2010-03-27 | Discharge: 2010-03-27 | Payer: Self-pay | Source: Home / Self Care | Attending: Internal Medicine | Admitting: Internal Medicine

## 2010-03-27 LAB — CONVERTED CEMR LAB: Blood Glucose, Fingerstick: 189

## 2010-03-31 ENCOUNTER — Encounter: Payer: Self-pay | Admitting: Internal Medicine

## 2010-03-31 LAB — GLUCOSE, CAPILLARY: Glucose-Capillary: 189 mg/dL — ABNORMAL HIGH (ref 70–99)

## 2010-04-08 NOTE — Assessment & Plan Note (Signed)
Summary: add per gayle/cfb   Vital Signs:  Patient Profile:   60 Years Old Female Height:     63 inches (160.02 cm) Weight:      217.0 pounds (98.64 kg) Temp:     98.0 degrees F (36.67 degrees C) oral Pulse rate:   78 / minute BP sitting:   121 / 78  (right arm)  Pt. in pain?   yes    Location:   jaw    Intensity:   10  Vitals Entered By: Krystal Eaton Duncan Dull) (August 10, 2006 2:27 PM)              Is Patient Diabetic? Yes   Have you ever been in a relationship where you felt threatened, hurt or afraid?No   Does patient need assistance? Functional Status Self care Ambulation Normal   PCP:  Kimberly Pitt, MD  Chief Complaint:  right side jaw pain radiating to neck and head started this am-unable to open mouth all the way.  History of Present Illness: [name] is a 60 y/o woman well known to me, who is coming in today for an acute visit 2/2 right jaw pain. This began abruptly this morning. She went to bed pm before feeling perfectly well. Pain is over right mandible angle. She denies fever or chills. Has not been able to eat anything today because she has difficulty opening her mouth. We were very concerned for an abscess and we sent her for a STAT CT neck which is completely negative; only has some reactive lymph nodes.  Current Allergies (reviewed today): ! GEMFIBROZIL (GEMFIBROZIL) ! PENICILLIN ! CODEINE ! * LATEX    Risk Factors: Tobacco use:  never Drug use:  no Alcohol use:  no    Physical Exam  General:     alert, well-developed, well-nourished, uncomfortable-appearing, and mild distress.   Mouth:     fair dentition.  Unable to open mouth completely 2/2 trismus and pain. as far as can see no tooth abscesses. Unable to see oropharynx. Lungs:     Normal respiratory effort, chest expands symmetrically. Lungs are clear to auscultation, no crackles or wheezes.    Impression & Recommendations:  Problem # 1:  JAW PAIN (ICD-526.9) Diferential  diagnosis would include parotitis, muscle spasm, sialolithiasis. Given 30 mg of IV toradol. She is instructed to take darvocet for pain and to reurn in the morning if pain worsens, trouble breathing, swallowing or if she develops fever. I will call her in the morning to touch base with her and see how she is feeling. If she does not improve will likely require admission for ENT vs dental surgeon consultation.  Orders: CT with Contrast (CT w/ contrast) T-Basic Metabolic Panel ([address]) T-CBC No Diff (09811-91478) Ketorolac-Toradol 15mg  (G9562)    Patient Instructions: 1)  Please schedule a follow-up appointment in 1 weeks. 2)  Take darvocet up to 2 pills every 4 hours for pain. 3)  Return in the morning if you are not feeling any better.  Appended Document: add per gayle/cfb    Clinical Lists Changes  Orders: Added new Service order of Ketorolac-Toradol 15mg  [phone]) - Signed Added new Service order of Admin of Therapeutic Inj  intravenous ([id number]) - Signed      Medication Administration  Injection # 1:    Medication: Ketorolac-Toradol 15mg     Diagnosis: JAW PAIN (ICD-526.9)    Route: IV    Site: R forearm    Exp Date: 03/09/2008    Lot #:  [id number]    Mfr: Nova Plus    Comments: Toradol 30 mg given per Dr. Ardyth Harps. Given with NS via IV drip.    Patient tolerated injection without complications    Given by: Geannie Risen RN (August 10, 2006 5:44 PM)  Orders Added: 1)  Ketorolac-Toradol 15mg  [J1885] 2)  Admin of Therapeutic Inj  intravenous [[id number]]

## 2010-04-08 NOTE — Progress Notes (Signed)
  Phone Note From Other Clinic   Caller[facility] Call For:  Arlene Genova  ON PATIENT - Marvetta Gibbons Details for Reason: WAITING FOR PATIENT TO SEND BACK PACKET(PAPER WORK) Summary of Call: [facility]NA [facility], SHE IS WAITING FOR [name] TO SEND BACK THE PACKET THAT SHE MAILED TO [name] TO FILL OUT.  SO WHEN SHE GETS THE PAPER WORK BACK THEN SHE WILL BE ABLE TO MAKE THE APPT. Korinne Greenstein NTII

## 2010-04-08 NOTE — Procedures (Signed)
Summary: Upper Endoscopy  Patient: [name] Note: All result statuses are Final unless otherwise noted.  Tests: (1) Upper Endoscopy (EGD)   EGD Upper Endoscopy       DONE     [facility]     [address]     [place], [place]  [address]           ENDOSCOPY PROCEDURE REPORT           PATIENT:  [name], [name]  MR#:  [id number]     BIRTHDATE:  [birth year], 59 yrs. old  GENDER:  female           ENDOSCOPIST:  Vania Rea. Jarold Motto, MD, [facility]     Referred by:           PROCEDURE DATE:  11/20/2009     PROCEDURE:  EGD, diagnostic, Maloney Dilation of Esophagus     ASA CLASS:  Class III     INDICATIONS:  dysphagia           MEDICATIONS:   Fentanyl 50 mcg IV, Versed 7 mg IV     TOPICAL ANESTHETIC:  Exactacain Spray           DESCRIPTION OF PROCEDURE:   After the risks benefits and     alternatives of the procedure were thoroughly explained, informed     consent was obtained.  The [facility] [id number] endoscope was     introduced through the mouth and advanced to the second portion of     the duodenum, without limitations.  The instrument was slowly     withdrawn as the mucosa was fully examined.     <<PROCEDUREIMAGES>>           s/p fundoplication. NO OBVIOUS STRICTURE.DILATED #46F MALONEY     DILATOR.  other findings. INCREASED MUCUS IN VOCAL CORED AREA.??     POST NASAL DRAINAGE.SEE PICTURES.    Retroflexed views revealed no     abnormalities.    The scope was then withdrawn from the patient     and the procedure completed.           COMPLICATIONS:  None           ENDOSCOPIC IMPRESSION:     1) S/p fundoplication     2) Other findings     DILATED PER SYMPTOMS.?? PRIMARY ENT PROBLEM.     RECOMMENDATIONS:     1) continue current medications     2) dilatations PRN     3) post dilation instructions     CONSIDER ENT REFERRAL.           REPEAT EXAM:  No           ______________________________     Vania Rea. Jarold Motto, MD, Clementeen Graham           CC:  Melida Quitter MD           n.     Rosalie DoctorVania Rea. Patterson at 11/20/2009 11:05 AM           Ruta Hinds, [id number]  Note: An exclamation mark (!) indicates a result that was not dispersed into the flowsheet. Document Creation Date: 11/20/2009 11:05 AM _______________________________________________________________________  (1) Order result status: Final Collection or observation date-time: 11/20/2009 10:56 Requested date-time:  Receipt date-time:  Reported date-time:  Referring Physician:   Ordering Physician: Sheryn Bison [phone]) Specimen Source:  Source: Launa Grill Order Number: [id number] Lab site:

## 2010-04-08 NOTE — Progress Notes (Signed)
Summary: med refill/gp  Phone Note Refill Request Message from:  Fax from Pharmacy on January 22, 2010 9:25 AM  Refills Requested: Medication #1:  PROZAC 20 MG CAPS Take 1 tablet by mouth every morning   Last Refilled: 12/26/2009 Pt. has an appt. 02/06/10.   Method Requested: Electronic Initial call taken by: Chinita Pester RN,  January 22, 2010 9:25 AM  Follow-up for Phone Call        Refill approved-nurse to complete Follow-up by: Melida Quitter MD,  January 22, 2010 12:04 PM    Prescriptions: PROZAC 20 MG CAPS (FLUOXETINE HCL) Take 1 tablet by mouth every morning  #30 x 1   Entered and Authorized by:   Melida Quitter MD   Signed by:   Melida Quitter MD on 01/22/2010   Method used:   Electronically to        [facility]* (retail)       [address]       [place], [place]  [address]       Ph: [phone]       Fax: [phone]   RxID:   [id number]

## 2010-04-08 NOTE — Progress Notes (Signed)
Summary: Nuclear Pre-Procedure  Phone Note Outgoing Call   Call placed by: Milana Na, EMT-P,  May 30, 2009 2:33 PM Summary of Call: Left message with information on [employer] Information Sheet (see scanned document for details).      Nuclear Med Background Indications for Stress Test: Evaluation for Ischemia   History: Abnormal EKG, Echo, GXT, Heart Catheterization, Myocardial Perfusion Study  History Comments: '04 Heart Cath NL '09 ECHO EF 55-60% '09 MPS NL 05/28/09 GXT non-DX reccomend MPS NL. coronaries Abnormal EKG: AFIB  Symptoms: Chest Pain, Palpitations    Nuclear Pre-Procedure Cardiac Risk Factors: Carotid Disease, Hypertension, NIDDM Height (in): 63  Nuclear Med Study Referring MD:  Rollene Rotunda MD

## 2010-04-08 NOTE — Progress Notes (Signed)
Summary: refill/gg  Phone Note Refill Request  on August 09, 2008 1:35 PM  Refills Requested: Medication #1:  AMITRIPTYLINE HCL 50 MG  TABS Take 1 tablet by mouth at bedtime   Last Refilled: 07/19/2008  Medication #2:  LASIX 40 MG TABS Take 1 tablet by mouth once a day   Last Refilled: 07/05/2008  Method Requested: Electronic Initial call taken by: Merrie Roof RN,  August 09, 2008 1:43 PM      Prescriptions: AMITRIPTYLINE HCL 50 MG  TABS (AMITRIPTYLINE HCL) Take 1 tablet by mouth at bedtime  #31 x 6   Entered and Authorized by:   Joaquin Courts  MD   Signed by:   Joaquin Courts  MD on 08/10/2008   Method used:   Electronically to        [facility]* (retail)       [address]       [place], [place]  [address]       Ph: [phone]       Fax: [phone]   RxID:   [id number] LASIX 40 MG TABS (FUROSEMIDE) Take 1 tablet by mouth once a day  #31 x 6   Entered and Authorized by:   Joaquin Courts  MD   Signed by:   Joaquin Courts  MD on 08/10/2008   Method used:   Electronically to        [facility]* (retail)       [address]       [place], [place]  [address]       Ph: [phone]       Fax: [phone]   RxID:   [id number]

## 2010-04-08 NOTE — Progress Notes (Signed)
Summary: med refill/gp  Phone Note Refill Request Message from:  Fax from Pharmacy on January 08, 2009 4:44 PM  Refills Requested: Medication #1:  OMEPRAZOLE 20 MG CPDR Take 1 tablet by mouth once a day   Last Refilled: 11/22/2008  Method Requested: Electronic Initial call taken by: Chinita Pester RN,  January 08, 2009 4:44 PM    Prescriptions: OMEPRAZOLE 20 MG CPDR (OMEPRAZOLE) Take 1 tablet by mouth once a day  #30 x 6   Entered and Authorized by:   Joaquin Courts  MD   Signed by:   Joaquin Courts  MD on 01/09/2009   Method used:   Electronically to        [facility]* (retail)       [address]       [place], [place]  [address]       Ph: [phone]       Fax: [phone]   RxID:   [id number]

## 2010-04-08 NOTE — Assessment & Plan Note (Signed)
Summary: EST-3 MONTH ROUTINE CHECKUP/CH   Vital Signs:  Patient profile:   60 year old female Menstrual status:  postmenopausal Height:      63 inches (160.02 cm) Weight:      205.0 pounds (93.18 kg) BMI:     36.45 Temp:     97.9 degrees F oral Pulse rate:   96 / minute BP sitting:   123 / 67  (right arm) Cuff size:   large  Vitals Entered By: Chinita Pester RN (February 06, 2010 10:31 AM) CC: 3 month f/u. Constant coughing; mucous sometimes. Med refills. Is Patient Diabetic? Yes Did you bring your meter with you today? No Pain Assessment Patient in pain? no      Nutritional Status BMI of > 30 = obese CBG Result 145  Have you ever been in a relationship where you felt threatened, hurt or afraid?No   Does patient need assistance? Functional Status Self care Ambulation Normal   Diabetic Foot Exam Last Podiatry Exam Date: 02/06/2010  Foot Inspection Is there a history of a foot ulcer?              No Is there a foot ulcer now?              No Can the patient see the bottom of their feet?          Yes Are the shoes appropriate in style and fit?          Yes Is there swelling or an abnormal foot shape?          No Are the toenails long?                No Are the toenails ingrown?              No Is there heavy callous build-up?              No Is there pain in the calf muscle (Intermittent claudication) when walking?    NoIs there a claw toe deformity?              No Is there elevated skin temperature?            No Is there limited ankle dorsiflexion?            No Is there foot or ankle muscle weakness?            No  Diabetic Foot Care Education Patient educated on appropriate care of diabetic feet.  Pulse Check          Right Foot          Left Foot Dorsalis Pedis:        normal            normal Comments: Had surgery on both big toes. High Risk Feet? Yes   10-g (5.07) Semmes-Weinstein Monofilament Test Performed by: Chinita Pester RN          Right Foot           Left Foot Visual Inspection     normal         normal Test Control      normal         normal Site 1         normal         normal Site 2         normal         normal Site 3  normal         normal Site 4         normal         normal Site 5         normal         normal Site 6         normal         normal Site 7         normal         normal Site 8         normal         normal Site 9         normal         normal  Impression      normal         normal   Primary Care Provider:  Melida Quitter MD  CC:  3 month f/u. Constant coughing; mucous sometimes. Med refills..  History of Present Illness: [name] is a 60 year old woman with pmh significant for HTN, Depression, HLD, Fatty liver, carotid stenosis, DM II, A-fib, Hypothyroidism and OA who is here today for:  1) DM II - Blood sugars run between 70-200. Pt notes feeling "shaky" and lightheaded when her blood sugar is 70, and states she will have a snack or some juice and symptoms will resolve.   2) Afib - followed by Dr. Antoine Poche and Dr. Jens Som. Last INR slightly subtherapeutic at 1.6. Her INR is followed by [facility].   3) Chronic cough - patient has hx of dysphagia, cough and esophageal stricture and has been evaluated by Dr. Jarold Motto in the past. Last endoscopy was essentially normal from a GI perspective, however did show some mucous in the vocal chords, and ENT referral was recommended. Patient still complains of productive cough with clear sputum. No associated fevers, or chills, and patient states this is a chronic problem.       Depression History:      The patient denies a depressed mood most of the day and a diminished interest in her usual daily activities.        Comments:  States it's better.   Preventive Screening-Counseling & Management  Alcohol-Tobacco     Alcohol drinks/day: 0     Smoking Status: never  Caffeine-Diet-Exercise     Does Patient Exercise: yes     Type of  exercise: WALKING sometimes  Current Medications (verified): 1)  Metformin Hcl 1000 Mg Tabs (Metformin Hcl) .... Take 1 Tablet By Mouth Two Times A Day 2)  Glipizide 10 Mg  Tabs (Glipizide) .... Take 1 Tablet By Mouth Two Times A Day 3)  Diltiazem Hcl Cr 180 Mg Cp24 (Diltiazem Hcl) .... Take 1 Tablet By Mouth Once A Day 4)  Warfarin Sodium 5 Mg Tabs (Warfarin Sodium) .... Take As Directed. 5)  Atenolol 25 Mg Tabs (Atenolol) .... Take 1 Tablet By Mouth Two Times A Day 6)  Omeprazole 20 Mg Cpdr (Omeprazole) .... Take 1 Tablet By Mouth Once A Day Prn 7)  Calcium 600/vitamin D 600-400 Mg-Unit Tabs (Calcium Carbonate-Vitamin D) .... Take 1 Tablet By Mouth Two Times A Day 8)  Multivitamins Tabs (Multiple Vitamin) .... Take 1 Tablet By Mouth Once A Day 9)  Levothyroxine Sodium 75 Mcg Tabs (Levothyroxine Sodium) .... Take 1 Tablet By Mouth Once A Day 10)  Lasix 40 Mg Tabs (Furosemide) .... Take 1 Tablet By Mouth Once A Day 11)  Klor-Con 10 10 Meq Tbcr (Potassium Chloride) .... Take 1 Tablet By Mouth Two Times A Day 12)  Digitek 0.25 Mg Tabs (Digoxin) .... Take 1 Tablet By Mouth Once A Day 13)  Fish Oil 1000 Mg Caps (Omega-3 Fatty Acids) .... Take 1 Tablet By Mouth Twice A Day 14)  Relion Blood Glucose Test   Strp (Glucose Blood) .... To Test Blood Sugar Twice Daily 15)  Lancets   Misc (Lancets) .... To Test Blood Sugar Twice Daily 16)  Lisinopril 10 Mg  Tabs (Lisinopril) .... Take 1 Tablet By Mouth Once A Day 17)  Pravachol 40 Mg Tabs (Pravastatin Sodium) .... Two Tablets By Mouth Once Daily. 18)  Prozac 20 Mg Caps (Fluoxetine Hcl) .... Take 1 Tablet By Mouth Every Morning 19)  Flexeril 5 Mg Tabs (Cyclobenzaprine Hcl) .... Take 1 Tab By Mouth Every 12 Hours As Needed For Pain. 20)  Niacin 500 Mg Tabs (Niacin) .... Two Tabs By Mouth Twice A Day. 21)  Amitriptyline Hcl 50 Mg Tabs (Amitriptyline Hcl) .... Take 1 Tab By Mouth At Bedtime 22)  Vicodin 5-500 Mg Tabs (Hydrocodone-Acetaminophen) .Marland Kitchen.. 1 Tab  Every 12 Hours As Needed For Pain 23)  Gabapentin 100 Mg Caps (Gabapentin) .... Take 1 Tab By Mouth At Bedtime 24)  Tricor 145 Mg Tabs (Fenofibrate) .... Take 1 Tablet By Mouth Once A Day 25)  Nystatin 100000 Unit/ml Susp (Nystatin) .... Swish and Swallow Four Times A Day  Allergies (verified): 1)  ! Gemfibrozil (Gemfibrozil) 2)  ! Penicillin 3)  ! Codeine 4)  ! * Latex  Past History:  Past Medical History: Last updated: 11/02/2008 Atrial fibrillation Diabetes mellitus, type II Diverticulosis, colon Hypertension Hypothyroidism Osteoarthritis Esophageal stricture, hx of Hiatal hernia, hx of, s/p Cholecystitis, hx of, s/p cholecystectomy Obstructive sleep apnea Sarcoidosis Diabetic neuropathy Atypical chest pain, hx of Benign right breast mass, hx of (papilloma) Diabetic Neuropathy Fibromyalgia  Past Surgical History: Last updated: 11/12/2009 Hiatal hernia repair Cholecystectomy Esophageal dilation x 2 Hysterectomy Excision of milk duct of right breast -> benign (1/08) Hernia Surgery(femoral) Right foot surgery 06/2009 Left foot surgery 09/2009  Family History: Last updated: 11/02/2008 Maternal uncle w/ dementia.   Melenoma: Father Family History of Colitis/Crohn's:Mother Family History of Diabetes: Father & Sister Family History of Heart Disease: Father  Social History: Last updated: 11/02/2008 Never Smoked Alcohol use-no Drug use-no Divorced On disability  Risk Factors: Alcohol Use: 0 (02/06/2010) Exercise: yes (02/06/2010)  Risk Factors: Smoking Status: never (02/06/2010)  Review of Systems      See HPI  Physical Exam  General:  alert and well-developed.  VS reviewed and wnl Head:  normocephalic and atraumatic.   Neck:  supple, no thyromegaly, and no JVD.   Lungs:  normal respiratory effort, normal breath sounds, and no crackles.   Heart:  normal rate, no murmur, no gallop, no rub, no JVD, and irregular rhythm.   Abdomen:  soft, non-tender,  normal bowel sounds, no distention, no masses, and no guarding.   Pulses:  pulses are 2+ equal bilaterally  Neurologic:  alert & oriented X3.  no other focal neurologic deficits   Diabetes Management Exam:    Foot Exam (with socks and/or shoes not present):       Sensory-Monofilament:          Left foot: normal          Right foot: normal   Impression & Recommendations:  Problem # 1:  HYPERLIPIDEMIA (ICD-272.4) Assessment Deteriorated TG were elevated, which did  not allow LDL to be calculated. Pt is not fasting today. Will have patient return to clinic in AM when she is fasting to check her lipids. She has been taking all her meds as prescribed. In the past she was on Zocor, and no other statins. Since Lipitor is now generic, would consider changing her statin to lipitor or crestor. Will await lipid profile results before making any changes.   Her updated medication list for this problem includes:    Pravachol 40 Mg Tabs (Pravastatin sodium) .Marland Kitchen..Marland Kitchen Two tablets by mouth once daily.    Niacin 500 Mg Tabs (Niacin) .Marland Kitchen..Marland Kitchen Two tabs by mouth twice a day.    Tricor 145 Mg Tabs (Fenofibrate) .Marland Kitchen... Take 1 tablet by mouth once a day  Labs Reviewed: SGOT: 38 (07/22/2009)   SGPT: 43 (07/22/2009)  Prior 10 Yr Risk Heart Disease: Not enough information (05/28/2009)   HDL:37 (07/22/2009), 36 (05/09/2009)  LDL:* mg/dL (16/12/9602), * mg/dL (54/11/8117)  JYNW:295 (07/22/2009), 168 (05/09/2009)  Trig:427 (07/22/2009), 506 (05/09/2009)  Orders: T-Lipid Profile (62[address]-86578) T-CMP with Estimated GFR ([address])  Problem # 2:  DIABETES MELLITUS, TYPE II (ICD-250.00) Assessment: Deteriorated A1C slightly elevated from prior reading. Explained where her A1C lies in the meter posted in the office. Advised patient that she is optimal oral therapy, and that she may require insulin in the future. Pt would like to continue oral therapy and states she will improve her diet.  Will continue current therapy and  follow in 3 months.   Her updated medication list for this problem includes:    Metformin Hcl 1000 Mg Tabs (Metformin hcl) .Marland Kitchen... Take 1 tablet by mouth two times a day    Glipizide 10 Mg Tabs (Glipizide) .Marland Kitchen... Take 1 tablet by mouth two times a day    Lisinopril 10 Mg Tabs (Lisinopril) .Marland Kitchen... Take 1 tablet by mouth once a day  Orders: T- Capillary Blood Glucose (41324) T-Hgb A1C (in-house) (40102VO)  Labs Reviewed: Creat: 0.68 (11/07/2009)    Reviewed HgBA1c results: 7.4 (02/06/2010)  7.1 (11/07/2009)  Problem # 3:  HYPOTHYROIDISM (ICD-244.9) No symptoms of hypothyroidism. Will continue current synthroid dose.   Her updated medication list for this problem includes:    Levothyroxine Sodium 75 Mcg Tabs (Levothyroxine sodium) .Marland Kitchen... Take 1 tablet by mouth once a day  Labs Reviewed: TSH: 1.247 (11/07/2009)    HgBA1c: 7.4 (02/06/2010) Chol: 129 (07/22/2009)   HDL: 37 (07/22/2009)   LDL: * mg/dL (53/66/4403)   TG: 474 (07/22/2009)  Problem # 4:  ATRIAL FIBRILLATION (ICD-427.31) Per cards. No recent hospitalizations, and patient is currently rate controlled.   Her updated medication list for this problem includes:    Diltiazem Hcl Cr 180 Mg Cp24 (Diltiazem hcl) .Marland Kitchen... Take 1 tablet by mouth once a day    Warfarin Sodium 5 Mg Tabs (Warfarin sodium) .Marland Kitchen... Take as directed.    Atenolol 25 Mg Tabs (Atenolol) .Marland Kitchen... Take 1 tablet by mouth two times a day    Digitek 0.25 Mg Tabs (Digoxin) .Marland Kitchen... Take 1 tablet by mouth once a day  Reviewed the following: PT: 14.4 (10/15/2008)   INR: 1.6 (03/17/2006) Coumadin Dose (weekly): 30 mg (01/17/2010) Prior Coumadin Dose (weekly): 30 mg (01/17/2010) Next Protime: 02/14/2010 (dated on 01/17/2010)  Problem # 5:  ESOPHAGEAL STRICTURE (ICD-530.3) Evaluated by Dr. Jarold Motto, who performed EGD. It was essentially normal and due to mucous in the vocal cords, it was suggested that patient should have ENT referral for further work up. Based on history of  chronic  productive cough, will refer patient to ENT.   Orders: ENT Referral (ENT)  Complete Medication List: 1)  Metformin Hcl 1000 Mg Tabs (Metformin hcl) .... Take 1 tablet by mouth two times a day 2)  Glipizide 10 Mg Tabs (Glipizide) .... Take 1 tablet by mouth two times a day 3)  Diltiazem Hcl Cr 180 Mg Cp24 (Diltiazem hcl) .... Take 1 tablet by mouth once a day 4)  Warfarin Sodium 5 Mg Tabs (Warfarin sodium) .... Take as directed. 5)  Atenolol 25 Mg Tabs (Atenolol) .... Take 1 tablet by mouth two times a day 6)  Omeprazole 20 Mg Cpdr (Omeprazole) .... Take 1 tablet by mouth once a day prn 7)  Calcium 600/vitamin D 600-400 Mg-unit Tabs (Calcium carbonate-vitamin d) .... Take 1 tablet by mouth two times a day 8)  Multivitamins Tabs (Multiple vitamin) .... Take 1 tablet by mouth once a day 9)  Levothyroxine Sodium 75 Mcg Tabs (Levothyroxine sodium) .... Take 1 tablet by mouth once a day 10)  Lasix 40 Mg Tabs (Furosemide) .... Take 1 tablet by mouth once a day 11)  Klor-con 10 10 Meq Tbcr (Potassium chloride) .... Take 1 tablet by mouth two times a day 12)  Digitek 0.25 Mg Tabs (Digoxin) .... Take 1 tablet by mouth once a day 13)  Fish Oil 1000 Mg Caps (Omega-3 fatty acids) .... Take 1 tablet by mouth twice a day 14)  Relion Blood Glucose Test Strp (Glucose blood) .... To test blood sugar twice daily 15)  Lancets Misc (Lancets) .... To test blood sugar twice daily 16)  Lisinopril 10 Mg Tabs (Lisinopril) .... Take 1 tablet by mouth once a day 17)  Pravachol 40 Mg Tabs (Pravastatin sodium) .... Two tablets by mouth once daily. 18)  Prozac 20 Mg Caps (Fluoxetine hcl) .... Take 1 tablet by mouth every morning 19)  Flexeril 5 Mg Tabs (Cyclobenzaprine hcl) .... Take 1 tab by mouth every 12 hours as needed for pain. 20)  Niacin 500 Mg Tabs (Niacin) .... Two tabs by mouth twice a day. 21)  Amitriptyline Hcl 50 Mg Tabs (Amitriptyline hcl) .... Take 1 tab by mouth at bedtime 22)  Vicodin 5-500 Mg  Tabs (Hydrocodone-acetaminophen) .Marland Kitchen.. 1 tab every 12 hours as needed for pain 23)  Gabapentin 100 Mg Caps (Gabapentin) .... Take 1 tab by mouth at bedtime 24)  Tricor 145 Mg Tabs (Fenofibrate) .... Take 1 tablet by mouth once a day 25)  Nystatin 100000 Unit/ml Susp (Nystatin) .... Swish and swallow four times a day  Patient Instructions: 1)  Please schedule a follow-up appointment in 3 months. 2)  Check your blood sugars regularly. If your readings are usually above 200 : or below 70 you should contact our office. 3)  Please come back tomorrow morning, to get blood work done. Please make sure you are fasting.  4)  Please see the ENT specialist as scheduled.  Prescriptions: VICODIN 5-500 MG TABS (HYDROCODONE-ACETAMINOPHEN) 1 tab every 12 hours as needed for pain  #60 x 0   Entered and Authorized by:   Melida Quitter MD   Signed by:   Melida Quitter MD on 02/06/2010   Method used:   Print then Give to Patient   RxID:   [id number] TRICOR 145 MG TABS (FENOFIBRATE) Take 1 tablet by mouth once a day  #31 x 3   Entered and Authorized by:   Melida Quitter MD   Signed by:   Melida Quitter MD on 02/06/2010   Method  used:   Faxed to ...       [facility]* (retail)       [address]       [place], [place]  [address]       Ph: [phone]       Fax: [phone]   RxID:   [id number] GABAPENTIN 100 MG CAPS (GABAPENTIN) Take 1 tab by mouth at bedtime  #31 x 3   Entered and Authorized by:   Melida Quitter MD   Signed by:   Melida Quitter MD on 02/06/2010   Method used:   Faxed to ...       [facility]* (retail)       [address]       [place], [place]  [address]       Ph: [phone]       Fax: [phone]   RxID:   [id number] AMITRIPTYLINE HCL 50 MG TABS (AMITRIPTYLINE HCL) Take 1 tab by mouth at bedtime  #31 x 3   Entered and Authorized by:   Melida Quitter MD   Signed by:   Melida Quitter MD on  02/06/2010   Method used:   Faxed to ...       [facility]* (retail)       [address]       [place], [place]  [address]       Ph: [phone]       Fax: [phone]   RxID:   [id number] FLEXERIL 5 MG TABS (CYCLOBENZAPRINE HCL) Take 1 tab by mouth every 12 hours as needed for pain.  #60 x 2   Entered and Authorized by:   Melida Quitter MD   Signed by:   Melida Quitter MD on 02/06/2010   Method used:   Faxed to ...       [facility]* (retail)       [address]       [place], [place]  [address]       Ph: [phone]       Fax: [phone]   RxID:   [id number] PROZAC 20 MG CAPS (FLUOXETINE HCL) Take 1 tablet by mouth every morning  #30 x 3   Entered and Authorized by:   Melida Quitter MD   Signed by:   Melida Quitter MD on 02/06/2010   Method used:   Faxed to ...       [facility]* (retail)       [address]       [place], [place]  [address]       Ph: [phone]       Fax: [phone]   RxID:   [id number] PRAVACHOL 40 MG TABS (PRAVASTATIN SODIUM) Two tablets by mouth once daily.  #60 x 6   Entered and Authorized by:   Melida Quitter MD   Signed by:   Melida Quitter MD on 02/06/2010   Method used:   Faxed to ...       [facility]* (retail)       [address]       [place], [place]  [address]  Ph: [phone]       Fax: [phone]   RxID:   [id number] LASIX 40 MG TABS (FUROSEMIDE) Take 1 tablet by mouth once a day  #31 x 6   Entered and Authorized by:   Melida Quitter MD   Signed by:   Melida Quitter MD on 02/06/2010   Method used:   Faxed to ...       [facility]* (retail)       [address]       [place], [place]  [address]       Ph: [phone]       Fax: [phone]   RxID:   [id number] LEVOTHYROXINE SODIUM 75 MCG TABS  (LEVOTHYROXINE SODIUM) Take 1 tablet by mouth once a day  #30 x 6   Entered and Authorized by:   Melida Quitter MD   Signed by:   Melida Quitter MD on 02/06/2010   Method used:   Faxed to ...       [facility]* (retail)       [address]       [place], [place]  [address]       Ph: [phone]       Fax: [phone]   RxID:   [id number] ATENOLOL 25 MG TABS (ATENOLOL) Take 1 tablet by mouth two times a day  #60 x 6   Entered and Authorized by:   Melida Quitter MD   Signed by:   Melida Quitter MD on 02/06/2010   Method used:   Faxed to ...       [facility]* (retail)       [address]       [place], [place]  [address]       Ph: [phone]       Fax: [phone]   RxID:   [id number] DILTIAZEM HCL CR 180 MG CP24 (DILTIAZEM HCL) Take 1 tablet by mouth once a day  #30 x 6   Entered and Authorized by:   Melida Quitter MD   Signed by:   Melida Quitter MD on 02/06/2010   Method used:   Faxed to ...       [facility]* (retail)       [address]       [place], [place]  [address]       Ph: [phone]       Fax: [phone]   RxID:   [id number]    Orders Added: 1)  T- Capillary Blood Glucose [82948] 2)  T-Hgb A1C (in-house) [83036QW] 3)  T-Lipid Profile [80061-22930] 4)  T-CMP with Estimated GFR [[address]] 5)  ENT Referral [ENT] 6)  Est. Patient Level IV [08676]    Prevention & Chronic Care Immunizations   Influenza vaccine: Fluvax MCR  (11/07/2009)   Influenza vaccine deferral: Not available  (08/24/2008)   Influenza vaccine due: 11/07/2008    Tetanus booster: Not documented   Td booster deferral: Deferred  (08/24/2008)    Pneumococcal vaccine: Not documented   Pneumococcal vaccine deferral: Deferred  (08/24/2008)    H. zoster vaccine: Not documented   H. zoster vaccine deferral: Deferred  (08/24/2008)  Colorectal  Screening   Hemoccult: Not documented   Hemoccult action/deferral: Ordered  (08/24/2008)    Colonoscopy: Location:  [facility].    (10/22/2008)   Colonoscopy action/deferral: Repeat colonoscopy in 7 years. 2012  (08/22/2003)   Colonoscopy due: 05/2010  Other Screening   Pap smear: Not documented   Pap smear action/deferral: Not indicated S/P hysterectomy  (08/24/2008)    Mammogram: ASSESSMENT: Negative - BI-RADS 1^MM DIGITAL SCREENING  (11/22/2009)   Mammogram due: 10/2008    DXA bone density scan:  Lumbar Spine:  T Score > -1.0 Spine.  Hip Total: T Score -2.5 to -1.0 Hip.      (03/20/2008)   DXA scan due: 03/2010    Smoking status: never  (02/06/2010)  Diabetes Mellitus   HgbA1C: 7.4  (02/06/2010)   HgbA1C action/deferral: Ordered  (12/04/2008)    Eye exam: Not documented   Diabetic eye exam action/deferral: Deferred  (05/09/2009)    Foot exam: yes  (02/06/2010)   Foot exam action/deferral: Do today   High risk foot: Yes  (02/06/2010)   Foot care education: Done  (02/06/2010)   Foot exam due: 03/13/2009    Urine microalbumin/creatinine ratio: 23.8  (12/15/2007)   Urine microalbumin action/deferral: Not indicated    Diabetes flowsheet reviewed?: Yes   Progress toward A1C goal: At goal  Lipids   Total Cholesterol: 129  (07/22/2009)   Lipid panel action/deferral: Lipid Panel ordered   LDL: * mg/dL  (54/11/8117)   LDL Direct: Not documented   HDL: 37  (07/22/2009)   Triglycerides: 427  (07/22/2009)    SGOT (AST): 38  (07/22/2009)   SGPT (ALT): 43  (07/22/2009)   Alkaline phosphatase: 68  (07/22/2009)   Total bilirubin: 0.8  (07/22/2009)    Lipid flowsheet reviewed?: Yes   Progress toward LDL goal: Deteriorated  Hypertension   Last Blood Pressure: 123 / 67  (02/06/2010)   Serum creatinine: 0.68  (11/07/2009)   BMP action: Ordered   Serum potassium 4.7  (11/07/2009)    Hypertension flowsheet reviewed?: Yes   Progress toward BP goal: At  goal  Self-Management Support :   Personal Goals (by the next clinic visit) :     Personal A1C goal: 7  (11/07/2009)     Personal blood pressure goal: 130/80  (11/07/2009)     Personal LDL goal: 100  (02/06/2010)    Patient will work on the following items until the next clinic visit to reach self-care goals:     Medications and monitoring: take my medicines every day, bring all of my medications to every visit, examine my feet every day  (02/06/2010)     Eating: drink diet soda or water instead of juice or soda, eat more vegetables, use fresh or frozen vegetables, eat foods that are low in salt, eat baked foods instead of fried foods, eat fruit for snacks and desserts  (02/06/2010)     Activity: take a 30 minute walk every day  (02/06/2010)    Diabetes self-management support: Education handout, Written self-care plan  (11/07/2009)   Last diabetes self-management training by diabetes educator: 07/25/2007   Last medical nutrition therapy: 03/08/2007    Hypertension self-management support: Education handout, Written self-care plan  (11/07/2009)    Lipid self-management support: Education handout, Written self-care plan  (11/07/2009)    Nursing Instructions: Diabetic foot exam today   Process Orders Check Orders Results:     [facility]: Check successful Tests Sent for requisitioning (February 06, 2010 11:59 AM):     02/06/2010: [facility] -- T-Lipid Profile [phone] (signed)     02/06/2010: [facility] -- T-CMP with Estimated GFR [id number] (signed)     Laboratory Results   Blood Tests   Date/Time Received: February 06, 2010 10:52 AM  Date/Time Reported: Alric Quan  February 06, 2010 10:52 AM   HGBA1C: 7.4%   (Normal Range: Non-Diabetic - 3-6%   Control Diabetic - 6-8%) CBG Random:: 145mg /dL

## 2010-04-08 NOTE — Medication Information (Signed)
Summary: rov/tp   Anticoagulant Therapy  Managed by: Geoffry Paradise, PharmD Referring MD: Rollene Rotunda MD PCP: Melida Quitter MD Supervising MD: Johney Frame MD, Fayrene Fearing Indication 1: Atrial Fibrillation (ICD-427.31) Lab Used: [facility] Site: [facility] INR POC 2.7 INR RANGE 2-3  Dietary changes: no    Health status changes: no    Bleeding/hemorrhagic complications: no    Recent/future hospitalizations: no    Any changes in medication regimen? no    Recent/future dental: no  Any missed doses?: no       Is patient compliant with meds? yes       Allergies: 1)  ! Gemfibrozil (Gemfibrozil) 2)  ! Penicillin 3)  ! Codeine 4)  ! * Latex  Anticoagulation Management History:      Positive risk factors for bleeding include history of CVA/TIA and presence of serious comorbidities.  Negative risk factors for bleeding include an age less than 33 years old.  The bleeding index is 'intermediate risk'.  Positive CHADS2 values include History of HTN, History of Diabetes, and Prior Stroke/CVA/TIA.  Negative CHADS2 values include Age > 32 years old.  The start date was 07/22/2004.  Her last INR was 1.6.  Anticoagulation responsible provider: Caitriona Sundquist MD, Fayrene Fearing.  INR POC: 2.7.  Cuvette Lot#: [id number].  Exp: 04/2011.    Anticoagulation Management Assessment/Plan:      The patient's current anticoagulation dose is Warfarin sodium 5 mg tabs: Take as directed..  The target INR is 2 - 3.  The next INR is due 03/14/2010.  Anticoagulation instructions were given to patient.  Results were reviewed/authorized by Geoffry Paradise, PharmD.         Prior Anticoagulation Instructions: INR 2.9  Continue taking Coumadin 1 tab (5 mg) on all days except for Coumadin 0.5 tab (2.5 mg) on Tuesdays and Saturdays.  Return to clinic in 4 weeks.   Current Anticoagulation Instructions: INR:  2.7   Your INR is at goal today.  Please continue to take your current doses of Coumadin at 0.5 tablet on Tuesday/Saturday and 1  tablet other days of the week.    Please return to clinic in 4 weeks for another INR check.

## 2010-04-08 NOTE — Progress Notes (Signed)
Summary: refill/ hla  Phone Note Refill Request Message from:  Fax from Pharmacy on November 02, 2006 11:36 AM  Refills Requested: Medication #1:  METFORMIN HCL 1000 MG TABS Take 1 tablet by mouth two times a day   Last Refilled: 8/18  Medication #2:  GLIPIZIDE 5 MG TABS Take 1 tablet by mouth two times a day ***** the refill for glipizide states 10mg  not 5mg   Initial call taken by: Marin Roberts RN,  November 02, 2006 11:36 AM  Follow-up for Phone Call        Please find out the refill history for the glipizide and how the patient is taking the medication; we need to know if she has been taking the 10 mg tablet two times a day regularly, or 5 mg. Follow-up by: Margarito Liner MD,  November 02, 2006 4:35 PM  Additional Follow-up for Phone Call Additional follow up Details #1::        10mg  glipizide which she breaks in half twice daily Additional Follow-up by: Marin Roberts RN,  November 09, 2006 2:34 PM    New/Updated Medications: GLIPIZIDE 10 MG  TABS (GLIPIZIDE) one half tab two times a day   Prescriptions: GLIPIZIDE 10 MG  TABS (GLIPIZIDE) one half tab two times a day  #30 x 2   Entered and Authorized by:   Ulyess Mort MD   Signed by:   Ulyess Mort MD on 11/09/2006   Method used:   Telephoned to ...       [facility]       [address]       [place], [place]  [address]       Ph: [phone]       Fax: [phone]   RxID:   [id number] METFORMIN HCL 1000 MG TABS (METFORMIN HCL) Take 1 tablet by mouth two times a day  #60 x 2   Entered and Authorized by:   Ulyess Mort MD   Signed by:   Ulyess Mort MD on 11/09/2006   Method used:   Telephoned to ...       [facility]       [address]       [place], [place]  [address]       Ph: [phone]       Fax: [phone]   RxID:   [id number]

## 2010-04-08 NOTE — Assessment & Plan Note (Signed)
Summary: dsmt/dmr   Vital Signs:  Patient Profile:   60 Years Old Female Height:     63 inches (160.02 cm) Weight:      214.0 pounds Temp:     97.4 degrees F oral  Pt. in pain?   no  Vitals Entered By: Filomena Jungling (March 08, 2007 2:13 PM)              Is Patient Diabetic? Yes   Does patient need assistance? Functional Status Self care Ambulation Normal     Current Allergies: ! GEMFIBROZIL (GEMFIBROZIL) ! PENICILLIN ! CODEINE ! * LATEX           ]  Vital Signs Todays Weight: 214.0lb  in          Nutrition assessment Weight change: Loss Amount of change: 2 # ETOH : No Do you read food labels? If so what do you look at?  yes Biggest challenge to eating healthy: Skipping meals, chronic dieting  Diabetes Disease Process Define diabetes in simple terms: Demonstrates competency State own type of diabetes: Demonstrates competency State diabetes is treated by meal plan-exercise-medication-monitoring-education: Demonstrates competency  Medications State name-action-dose-duration-side effects-and time to take medication: Demonstrates competency State appropriate timing of food related to medication: Demonstrates competency Demonstrates/verbalizes site selection and rotation for injections Not applicable Correctly draw up and administer insulin-Byetta-Symlin-glucagon: Not applicable State diabetes medication adjustments for sick days: Demonstrates competency State insulin adjustment guidelines: Not applicable  Describe safe needle/lancet disposal: Demonstrates competency  Nutritional Management Identify what foods most often affect blood glucose: Demonstrates competency Verbalize importance of controlling food portions: Demonstrates competency State importance of spacing and not omitting meals and snacks: Demonstrates competency State changes planned for home meals/snacks: Demonstrates competency Nutrition Education  Done:  03/08/2007  Monitoring State purpose and frequency of monitoring BG-ketones-HgbA1C and when to contact health care team with results: Needs review/assistance Perform glucose monitoring/ketone testing and record results correctly: Demonstrates competency State target blood glucose and HgbA1C goals: Needs review/assistance  Complications State the causes-signs and symptoms and prevention of Hyperglycemia: Needs review/assistance Explain proper treatment of hyperglycemia: Needs review/assistance State the causes- signs and symptoms and prevention of hypoglycemia: Demonstrates competency Explain proper treatment of hypoglycemia: Needs review/assistance State sick day guidelines and when to contact health care team: Needs review/assistance State the relationship between blood glucose control and the development/prevention of long-term complications: Needs review/assistance State benefits-risks-and options for improving blood sugar control: Needs review/assistance State the relationship between blood pressure and lipid control in the prevention/control of cardiovascular disease: Needs review/assistance State the principles of skin-dental and foot care: Demonstrates competency Describe symptoms of skin and foot problems and describe foot exam: Demonstrates competency State when to seek medical advice and treatment: Demonstrates competency  Exercise States importance of exercise: Demonstrates competency States effect of exercise on blood glucose: Psychologist, forensic safety measures for exercise related to diabetes: Needs review/assistance  Lifestyle changes:Goal setting and Problem solving State benefits of making appropriate lifestyle changes: Demonstrates competency Identify lifestyle behaviors that need to change: Demonstrates competency Identify risk factors that interfere with health: Demonstrates competency Develop strategies to reduce risk factors: Needs  review/assistance Verbalize need for and frequency of health care follow-up: Demonstrates competency List at least two appropriate community resources: Demonstrates competency Identify Family/SO role in managing diabetes: Demonstrates competency  Preconception care-Pregnancy-GDM management ( if applicable)  Not applicable  Psychosocial Adjustment State three common feelings that might be experienced when learning to cope with diabetes: Needs review/assistance Identify two methods to cope with these feelings: Needs review/assistance Identify how stress affects diabetes & two sources of stress: Needs review/assistance Name two ways of obtaining support from family/friends: Needs review/assistance Diabetes Management Education Done: 03/08/2007    BEHAVIORAL GOAL FOLLOW UP Incorporating physical activity into lifestyle: Always Incorporating appropriate nutritional management: Most of the time Monitoring blood glucose levels daily: 50%of the time Specific goal set today: record her daily intake to help with weight loss and CBg control      CBgs not done daily, ones that were done were mostly 70- 150 range. Uses [employer] reliOn meter- Her CBg today was 108mg /dl.  Has symptoms of low blood sugar, but eats when this happens without checking. has a fear fo lows from her experiences with her father. Still desires to get A1C to < 6% and to decrease weight. Estimated calories for her nad discussed food record and CBG records to assist her in seeing patterns. Estimated needs 1300-15500 calories & 60-70 gram protein/day for weight loss.  1- blood sugar control ( a1C 7.6%) Does not want to go on insulin.  2-weight loss ( BMI-37.9- obese)    Last LDL:                                                 See Comment mg/dL (16/12/9602 5:40:98 PM)      Appended Document: dsmt/dmr adding testing supplies to medication  list   Clinical Lists Changes  Medications: Added new medication of RELION BLOOD GLUCOSE TEST   STRP (GLUCOSE BLOOD) to test blood sugar twice daily Added new medication of LANCETS   MISC (LANCETS) to test blood sugar twice daily

## 2010-04-08 NOTE — Assessment & Plan Note (Signed)
Summary: RA/FU VISIT/DS   Vital Signs:  Patient Profile:   60 Years Old Female Height:     63 inches (160.02 cm) Weight:      212.4 pounds (96.55 kg) BMI:     37.76 Temp:     96.8 degrees F (36 degrees C) oral Pulse rate:   91 / minute BP sitting:   121 / 75  (right arm) Cuff size:   regular  Vitals Entered By: Theotis Barrio NT II (December 15, 2007 8:50 AM)             Is Patient Diabetic? Yes  Nutritional Status BMI of > 30 = obese  Have you ever been in a relationship where you felt threatened, hurt or afraid?No   Does patient need assistance? Functional Status Self care Ambulation Normal    Flu Vaccine Consent Questions     Do you have a history of severe allergic reactions to this vaccine? no    Any prior history of allergic reactions to egg and/or gelatin? no    Do you have a sensitivity to the preservative Thimersol? no    Do you have a past history of Guillan-Barre Syndrome? no    Do you currently have an acute febrile illness? no    Have you ever had a severe reaction to latex? no    Vaccine information given and explained to patient? yes    Are you currently pregnant? no    Lot Number:[id number] exp 09/05/08   Site Given  Left Deltoid IM.Krystal Eaton (AAMA)  December 15, 2007 11:42 AM    PCP:  Joaquin Courts  MD  Chief Complaint:  WANTS FLU SHOT WHEN SHE COME BACK / MEDICATION REFILL .  History of Present Illness: Pt is a 60 yo female w/ PMH below here for routine f/u.  She notes having memory problems.  Her daughter(who is not here)  thinks it has been about a year but the pt thinks it has only been for several months.  She notes that she was driving to a friend's house where she had been many times and forgot how to get there.  She eventually remembered and arrived without difficulty.  She also notes that her daughter keeps telling her that she is saying the same things twice in conversations.  Her daughter also notes a decreased attention span.  She  notes a few HA's this week.  She notes falling out of her shower and hitting her head on the toilet but the imaging(CT head) at the time was normal.  No other traumatic events.  No incontinence or gait abnormalities.  No changes in her vision.     Updated Prior Medication List: METFORMIN HCL 1000 MG TABS (METFORMIN HCL) Take 1 tablet by mouth two times a day GLIPIZIDE 10 MG  TABS (GLIPIZIDE) one  tab two times a day DILTIAZEM HCL CR 180 MG CP24 (DILTIAZEM HCL) Take 1 tablet by mouth once a day DARVOCET-N 100 100-650 MG TABS (PROPOXYPHENE N-APAP) Take 1 tablet by mouth every 4 hours as needed for pain WARFARIN SODIUM 5 MG TABS (WARFARIN SODIUM) Take 1 tablet by mouth once a day LEXAPRO 10 MG TABS (ESCITALOPRAM OXALATE) Take 1 tablet by mouth once a day ATENOLOL 25 MG TABS (ATENOLOL) Take 1 tablet by mouth two times a day OMEPRAZOLE 20 MG CPDR (OMEPRAZOLE) Take 1 tablet by mouth once a day CALCARB 600/D 600-125 MG-UNIT TABS (CALCIUM-VITAMIN D) Take 1 tablet by mouth two times a day  MULTIVITAMINS TABS (MULTIPLE VITAMIN) Take 1 tablet by mouth once a day LEVOTHYROXINE SODIUM 75 MCG TABS (LEVOTHYROXINE SODIUM) Take 1 tablet by mouth once a day LASIX 40 MG TABS (FUROSEMIDE) Take 1 tablet by mouth once a day KLOR-CON 10 10 MEQ TBCR (POTASSIUM CHLORIDE) Take 1 tablet by mouth two times a day DIGITEK 0.25 MG TABS (DIGOXIN) Take 1 tablet by mouth once a day AMITRIPTYLINE HCL 50 MG  TABS (AMITRIPTYLINE HCL) Take 1 tablet by mouth at bedtime FISH OIL 1000 MG CAPS (OMEGA-3 FATTY ACIDS) Take 1 tablet by mouth once a day RELION BLOOD GLUCOSE TEST   STRP (GLUCOSE BLOOD) to test blood sugar twice daily LANCETS   MISC (LANCETS) to test blood sugar twice daily LISINOPRIL 10 MG  TABS (LISINOPRIL) Take 1 tablet by mouth once a day  Current Allergies (reviewed today): ! GEMFIBROZIL (GEMFIBROZIL) ! PENICILLIN ! CODEINE ! * LATEX  Past Medical History:    Reviewed history from 05/07/2006 and no changes  required:       Atrial fibrillation       Diabetes mellitus, type II       Diverticulosis, colon       Hypertension       Hypothyroidism       Osteoarthritis       Esophageal stricture, hx of       Hiatal hernia, hx of, s/p       Cholecystitis, hx of, s/p cholecystectomy       Obstructive sleep apnea       Sarcoidosis       Diabetic neuropathy       Atypical chest pain, hx of       Benign right breast mass, hx of (papilloma)       Diabetic Neuropathy   Family History:    Reviewed history and no changes required:       Maternal uncle w/ dementia.    Social History:    Reviewed history from 04/05/2006 and no changes required:       Never Smoked       Alcohol use-no       Drug use-no       Divorced       On disability   Risk Factors: Tobacco use:  never Drug use:  no Alcohol use:  no Exercise:  yes    Type:  WALKING Seatbelt use:  100 %  Colonoscopy History:    Date of Last Colonoscopy:  08/22/2003   Review of Systems       As per HPI.     Physical Exam  General:     Alert, pleasant, no distress. Head:     [place] 7/AT. Eyes:     PERRL, EOMI, anicteric. Mouth:     MMM, no oropharyngeal exudates. Neck:     No LAD, no carotid bruit. Lungs:     CTA bilaterally, good air movement, normal effort. Heart:     Irregularly irregular, normal rate. Abdomen:     +BS's, soft, NT, ND, obese. Msk:     Strength 5/5 in all extremities.  Extremities:     No edema.  Neurologic:     CN's II-XII intact, no cerebellar signs, gait normal but uses arms to get out of the chair, Romberg normal, patellar and biceps reflexes hyperreflexive.   Cervical Nodes:     No LAD. Psych:     Mood euthymic.    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  Interestingly though, pt's clock face drawing was abnormal w/ her numbers ending on the face at 10, so perhaps some executive deficits.  Will check CBC,  TSH, B12 and RPR.  Will also check MRI of the brain looking for lesions that could explain her symptoms.  Do not think it could be related to medications b/c all of her meds are chronic and she's been on them for quite some time, but it is possible.  Will also try to get her in for more detailed memory testing at [facility] for further testing.    Orders: T-TSH [id number]) T-CBC No Diff ([id number]) T-Syphilis Test (RPR) [phone]) T-Vitamin B12 ([id number]) MRI with & without Contrast (MRI w&w/o Contrast) Misc. Referral (Misc. Ref)   Problem # 2:  DIABETES MELLITUS, TYPE II (ICD-250.00) Checking a1c, urine microalb/cr ratio, and lipids today.  Has been around goal w/ these previously.  Monofilament testing and will inquire about last eye exam at her next visit.  BP at goal.    Her updated medication list for this problem includes:    Metformin Hcl 1000 Mg Tabs (Metformin hcl) .Marland Kitchen... Take 1 tablet by mouth two times a day    Glipizide 10 Mg Tabs (Glipizide) ..... One  tab two times a day    Lisinopril 10 Mg Tabs (Lisinopril) .Marland Kitchen... Take 1 tablet by mouth once a day  Orders: T-Hgb A1C (in-house) (28413KG) T-Urine Microalbumin w/creat. ratio [phone] / [id number])  Labs Reviewed: HgBA1c: 7.1 (12/15/2007)   Creat: 0.74 (08/04/2007)   Microalbumin: 1.06 (01/28/2007)   Problem # 3:  HYPERTENSION (ICD-401.9) At goal.  Checking BMET.  Her updated medication list for this problem includes:    Diltiazem Hcl Cr 180 Mg Cp24 (Diltiazem hcl) .Marland Kitchen... Take 1 tablet by mouth once a day    Atenolol 25 Mg Tabs (Atenolol) .Marland Kitchen... Take 1 tablet by mouth two times a day    Lasix 40 Mg Tabs (Furosemide) .Marland Kitchen... Take 1 tablet by mouth once a day    Lisinopril 10 Mg Tabs (Lisinopril) .Marland Kitchen... Take 1 tablet by mouth once a day  BP today: 121/75 Prior BP: 123/74 (08/30/2007)  Labs Reviewed: Creat: 0.74 (08/04/2007) Chol: 180 (06/16/2007)   HDL: 37 (06/16/2007)   LDL: 105 (06/16/2007)    TG: 192 (06/16/2007)   Problem # 4:  ATRIAL FIBRILLATION (ICD-427.31) Follows up w/ [facility].  They manage her coumadin.  Rate controlled.  Her updated medication list for this problem includes:    Warfarin Sodium 5 Mg Tabs (Warfarin sodium) .Marland Kitchen... Take as directed.    Atenolol 25 Mg Tabs (Atenolol) .Marland Kitchen... Take 1 tablet by mouth two times a day    Digitek 0.25 Mg Tabs (Digoxin) .Marland Kitchen... Take 1 tablet by mouth once a day   Problem # 5:  HYPERLIPIDEMIA (ICD-272.4) Checking lipids today.  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 Not on statin presently b/c of mild transaminitis.  Has fatty liver and may be worth starting statin therapy and following LFT's closely.  Orders: T-Comprehensive Metabolic Panel [id number]) T-Lipid Profile [phone])  Labs Reviewed: Chol: 180 (06/16/2007)   HDL: 37 (06/16/2007)   LDL: 105 (06/16/2007)   TG: 192 (06/16/2007) SGOT: 45 (08/04/2007)   SGPT: 67 (08/04/2007)   Problem # 6:  HEALTH MAINTENANCE EXAM (ICD-V70.0) Has had complete hysterectomy. (no more PAP smears). Will get DEXA scan at her next visit  as she is 25 years post-menopausal.  Mammogram up to date.  Per patient had colonoscopy 3 years ago....will obtain result with the [facility] office-stool cards at next visit. Given flu vax.   Complete Medication List: 1)  Metformin Hcl 1000 Mg Tabs (Metformin hcl) .... Take 1 tablet by mouth two times a day 2)  Glipizide 10 Mg Tabs (Glipizide) .... One  tab two times a day 3)  Diltiazem Hcl Cr 180 Mg Cp24 (Diltiazem hcl) .... Take 1 tablet by mouth once a day 4)  Darvocet-n 100 100-650 Mg Tabs (Propoxyphene n-apap) .... Take 1 tablet by mouth every 4 hours as needed for pain 5)  Warfarin Sodium 5 Mg Tabs (Warfarin sodium) .... Take as directed. 6)  Lexapro 10 Mg Tabs (Escitalopram oxalate) .... Take 1 tablet by mouth once a day 7)  Atenolol 25 Mg Tabs (Atenolol) .... Take 1 tablet by mouth two times a day 8)   Omeprazole 20 Mg Cpdr (Omeprazole) .... Take 1 tablet by mouth once a day 9)  Calcarb 600/d 600-125 Mg-unit Tabs (Calcium-vitamin d) .... Take 1 tablet by mouth two times a day 10)  Multivitamins Tabs (Multiple vitamin) .... Take 1 tablet by mouth once a day 11)  Levothyroxine Sodium 75 Mcg Tabs (Levothyroxine sodium) .... Take 1 tablet by mouth once a day 12)  Lasix 40 Mg Tabs (Furosemide) .... Take 1 tablet by mouth once a day 13)  Klor-con 10 10 Meq Tbcr (Potassium chloride) .... Take 1 tablet by mouth two times a day 14)  Digitek 0.25 Mg Tabs (Digoxin) .... Take 1 tablet by mouth once a day 15)  Amitriptyline Hcl 50 Mg Tabs (Amitriptyline hcl) .... Take 1 tablet by mouth at bedtime 16)  Fish Oil 1000 Mg Caps (Omega-3 fatty acids) .... Take 1 tablet by mouth once a day 17)  Relion Blood Glucose Test Strp (Glucose blood) .... To test blood sugar twice daily 18)  Lancets Misc (Lancets) .... To test blood sugar twice daily 19)  Lisinopril 10 Mg Tabs (Lisinopril) .... Take 1 tablet by mouth once a day  Other Orders: Flu Vaccine 34yrs + ([address]) Administration Flu vaccine ([id number])   Patient Instructions: 1)  Please make a followup appointment in three months. 2)  Please get your MRI done. 3)  We will call you with abnormal results. 4)  We are going to try to get you an appointment at [facility] for your memory.   Prescriptions: ATENOLOL 25 MG TABS (ATENOLOL) Take 1 tablet by mouth two times a day  #60 x 6   Entered and Authorized by:   Joaquin Courts  MD   Signed by:   Joaquin Courts  MD on 12/15/2007   Method used:   Electronically to        [facility]* (retail)       [address]       [place], [place]  [address]       Ph: [phone]       Fax: [phone]   RxID:   [id number] AMITRIPTYLINE HCL 50 MG  TABS (AMITRIPTYLINE HCL) Take 1 tablet by mouth at bedtime  #31 x 6   Entered and Authorized by:   Joaquin Courts  MD   Signed by:    Joaquin Courts  MD on 12/15/2007   Method used:   Electronically to        [facility] (retail)       [address]       [place], [place]  [address]       Ph: [phone]       Fax: [phone]   RxID:   [id number] OMEPRAZOLE 20 MG CPDR (OMEPRAZOLE) Take 1 tablet by mouth once a day  #31 x 6   Entered and Authorized by:   Joaquin Courts  MD   Signed by:   Joaquin Courts  MD on 12/15/2007   Method used:   Electronically to        [facility]* (retail)       [address]       [place], [place]  [address]       Ph: [phone]       Fax: [phone]   RxID:   [id number] LASIX 40 MG TABS (FUROSEMIDE) Take 1 tablet by mouth once a day  #31 x 6   Entered and Authorized by:   Joaquin Courts  MD   Signed by:   Joaquin Courts  MD on 12/15/2007   Method used:   Electronically to        [facility]* (retail)       [address]       [place], [place]  [address]       Ph: [phone]       Fax: [phone]   RxID:   [id number] WARFARIN SODIUM 5 MG TABS (WARFARIN SODIUM) Take as directed.  #45 x 6   Entered and Authorized by:   Joaquin Courts  MD   Signed by:   Joaquin Courts  MD on 12/15/2007   Method used:   Electronically to        [facility]* (retail)       [address]       [place], [place]  [address]       Ph: [phone]       Fax: [phone]   RxID:   [id number]  ] Laboratory Results   Blood Tests   Date/Time Received: December 15, 2007 10:11 AM. Date/Time Reported: Alric Quan  December 15, 2007 10:11 AM   HGBA1C: 7.1%   (Normal Range: Non-Diabetic - 3-6%   Control Diabetic - 6-8%)

## 2010-04-08 NOTE — Letter (Signed)
Summary: Handout Printed  Printed Handout:  - *Patient Instructions 

## 2010-04-08 NOTE — Assessment & Plan Note (Signed)
Summary: FU/ TEST RESULT/ SB.   Vital Signs:  Patient profile:   60 year old female Height:      63 inches (160.02 cm) Weight:      202.1 pounds (91.86 kg) BMI:     35.93 Temp:     97.3 degrees F oral Pulse rate:   95 / minute BP sitting:   118 / 62  (right arm)  Vitals Entered By: Chinita Pester RN (August 24, 2008 10:15 AM) CC: Re-check labs;  feels exhausted;  c/o lower back pain when up and walking;med refills Is Patient Diabetic? Yes  Pain Assessment Patient in pain? no      Nutritional Status BMI of > 30 = obese CBG Result 145  Have you ever been in a relationship where you felt threatened, hurt or afraid?No   Does patient need assistance? Functional Status Self care Ambulation Normal   Primary Care Provider:  Joaquin Courts  MD  CC:  Re-check labs;  feels exhausted;  c/o lower back pain when up and walking;med refills.  History of Present Illness: Pt is a 60 yo female w/ past medical history below here for f/u on lipids and refills on meds.  She has started the pravastatin and has been tired and thinks it may be related to the pravastatin.  She would also like a refill on davrocet for her back pain.  She started weight watchers this week and would like to lose 40 lbs.  Her blood sugars have been doing good and she is not having lows.    Preventive Screening-Counseling & Management  Alcohol-Tobacco     Alcohol drinks/day: 0     Smoking Status: never  Caffeine-Diet-Exercise     Does Patient Exercise: yes     Type of exercise: WALKING/Weight Watchers  Current Medications (verified): 1)  Metformin Hcl 1000 Mg Tabs (Metformin Hcl) .... Take 1 Tablet By Mouth Two Times A Day 2)  Glipizide 10 Mg  Tabs (Glipizide) .... One  Tab Two Times A Day 3)  Diltiazem Hcl Cr 180 Mg Cp24 (Diltiazem Hcl) .... Take 1 Tablet By Mouth Once A Day 4)  Darvocet-N 100 100-650 Mg Tabs (Propoxyphene N-Apap) .... Take 1 Tablet By Mouth Every 4 Hours As Needed For Pain 5)  Warfarin Sodium 5  Mg Tabs (Warfarin Sodium) .... Take As Directed. 6)  Lexapro 10 Mg Tabs (Escitalopram Oxalate) .... Take 1 Tablet By Mouth Once A Day 7)  Atenolol 25 Mg Tabs (Atenolol) .... Take 1 Tablet By Mouth Two Times A Day 8)  Omeprazole 20 Mg Cpdr (Omeprazole) .... Take 1 Tablet By Mouth Once A Day 9)  Calcium 600/vitamin D 600-400 Mg-Unit Tabs (Calcium Carbonate-Vitamin D) .... Take 1 Tablet By Mouth Two Times A Day 10)  Multivitamins Tabs (Multiple Vitamin) .... Take 1 Tablet By Mouth Once A Day 11)  Levothyroxine Sodium 75 Mcg Tabs (Levothyroxine Sodium) .... Take 1 Tablet By Mouth Once A Day 12)  Lasix 40 Mg Tabs (Furosemide) .... Take 1 Tablet By Mouth Once A Day 13)  Klor-Con 10 10 Meq Tbcr (Potassium Chloride) .... Take 1 Tablet By Mouth Two Times A Day 14)  Digitek 0.25 Mg Tabs (Digoxin) .... Take 1 Tablet By Mouth Once A Day 15)  Amitriptyline Hcl 50 Mg  Tabs (Amitriptyline Hcl) .... Take 1 Tablet By Mouth At Bedtime 16)  Fish Oil 1000 Mg Caps (Omega-3 Fatty Acids) .... Take 1 Tablet By Mouth Twice A Day 17)  Relion Blood Glucose Test  Strp (Glucose Blood) .... To Test Blood Sugar Twice Daily 18)  Lancets   Misc (Lancets) .... To Test Blood Sugar Twice Daily 19)  Lisinopril 10 Mg  Tabs (Lisinopril) .... Take 1 Tablet By Mouth Once A Day 20)  Pravachol 20 Mg Tabs (Pravastatin Sodium) .... Take 1/2 Tab By Mouth Before Bedtime and Increase To A Full Tab At Bedtime After One Week.  Allergies (verified): 1)  ! Gemfibrozil (Gemfibrozil) 2)  ! Penicillin 3)  ! Codeine 4)  ! * Latex  Past History:  Past Medical History: Last updated: 05/07/2006 Atrial fibrillation Diabetes mellitus, type II Diverticulosis, colon Hypertension Hypothyroidism Osteoarthritis Esophageal stricture, hx of Hiatal hernia, hx of, s/p Cholecystitis, hx of, s/p cholecystectomy Obstructive sleep apnea Sarcoidosis Diabetic neuropathy Atypical chest pain, hx of Benign right breast mass, hx of (papilloma) Diabetic  Neuropathy  Past Surgical History: Last updated: 04/05/2006 Hiatal hernia repair Cholecystectomy Esophageal dilation x 2 Hysterectomy Excision of milk duct of right breast -> benign (1/08)  Social History: Last updated: 04/05/2006 Never Smoked Alcohol use-no Drug use-no Divorced On disability  Risk Factors: Smoking Status: never (08/24/2008)  Social History: Reviewed history from 04/05/2006 and no changes required. Never Smoked Alcohol use-no Drug use-no Divorced On disability  Review of Systems       As per HPI.  Physical Exam  General:  alert, pleasant, no distress.  Eyes:  PERRL, EOMI, conjunctiva does not appear pale. Lungs:  Normal respiratory effort, chest expands symmetrically. Lungs are clear to auscultation, no crackles or wheezes. Heart:  irreg, irreg, reg rate. Abdomen:  +BS's, soft, NT and ND. Extremities:  no edema Psych:  mood euthymic.   Impression & Recommendations:  Problem # 1:  HYPERLIPIDEMIA (ICD-272.4) F/u LFT's after statin added.  TG high but intolerant to niacin and allergy to gemfibrozil.  Hopefully will have some improvement.  Denies muscle aches.  Has felt tired and I'm unsure if this is related or not as she is under lots of stress w/ her father passing recently.  Her updated medication list for this problem includes:    Pravachol 20 Mg Tabs (Pravastatin sodium) .Marland Kitchen... Take 1/2 tab by mouth before bedtime and increase to a full tab at bedtime after one week.  Orders: T-Comprehensive Metabolic Panel ([id number])  Problem # 2:  FATIGUE (ICD-780.79) Will check hgb and also f/u TSH.  Inquired about depressive symptoms and those were neg.  Has been under a lot of stress and mentions that she may just need a vacation.  Will f/u at next visit.  Problem # 3:  DIABETES MELLITUS, TYPE II (ICD-250.00) Not due for a1c but previous control has been very good.  Notes good sugars at home.  Cont ACE inh, f/u lipids today.  BP at goal.  Up to date  on foot exam.   Has seen eye doctor.  Her updated medication list for this problem includes:    Metformin Hcl 1000 Mg Tabs (Metformin hcl) .Marland Kitchen... Take 1 tablet by mouth two times a day    Glipizide 10 Mg Tabs (Glipizide) ..... One  tab two times a day    Lisinopril 10 Mg Tabs (Lisinopril) .Marland Kitchen... Take 1 tablet by mouth once a day  Orders: Capillary Blood Glucose/CBG ([address])  Problem # 4:  HYPOTHYROIDISM (ICD-244.9) TSH today.  Her updated medication list for this problem includes:    Levothyroxine Sodium 75 Mcg Tabs (Levothyroxine sodium) .Marland Kitchen... Take 1 tablet by mouth once a day  Orders: T-TSH ([id number])  Problem #  5:  ATRIAL FIBRILLATION (ICD-427.31) Rate controlled.  INR followed by cards.  Her updated medication list for this problem includes:    Diltiazem Hcl Cr 180 Mg Cp24 (Diltiazem hcl) .Marland Kitchen... Take 1 tablet by mouth once a day    Warfarin Sodium 5 Mg Tabs (Warfarin sodium) .Marland Kitchen... Take as directed.    Atenolol 25 Mg Tabs (Atenolol) .Marland Kitchen... Take 1 tablet by mouth two times a day    Digitek 0.25 Mg Tabs (Digoxin) .Marland Kitchen... Take 1 tablet by mouth once a day  Orders: T-CBC No Diff ([id number])  Problem # 6:  PREVENTIVE HEALTH CARE (ICD-V70.0) Has appt for repeat colonoscopy next month.  Repeat mammogram due 08/10.  Up to date on dexa.  Problem # 7:  OSTEOARTHRITIS (ICD-715.90) Has chronic back pain and notes hx of fibromyalgia.  Has been on stable dose of darvocet for many years.  Will refill today at current dose.  Her updated medication list for this problem includes:    Darvocet-n 100 100-650 Mg Tabs (Propoxyphene n-apap) .Marland Kitchen... Take 1 tablet by mouth every 4 hours as needed for pain  Complete Medication List: 1)  Metformin Hcl 1000 Mg Tabs (Metformin hcl) .... Take 1 tablet by mouth two times a day 2)  Glipizide 10 Mg Tabs (Glipizide) .... One  tab two times a day 3)  Diltiazem Hcl Cr 180 Mg Cp24 (Diltiazem hcl) .... Take 1 tablet by mouth once a day 4)  Darvocet-n 100  100-650 Mg Tabs (Propoxyphene n-apap) .... Take 1 tablet by mouth every 4 hours as needed for pain 5)  Warfarin Sodium 5 Mg Tabs (Warfarin sodium) .... Take as directed. 6)  Lexapro 10 Mg Tabs (Escitalopram oxalate) .... Take 1 tablet by mouth once a day 7)  Atenolol 25 Mg Tabs (Atenolol) .... Take 1 tablet by mouth two times a day 8)  Omeprazole 20 Mg Cpdr (Omeprazole) .... Take 1 tablet by mouth once a day 9)  Calcium 600/vitamin D 600-400 Mg-unit Tabs (Calcium carbonate-vitamin d) .... Take 1 tablet by mouth two times a day 10)  Multivitamins Tabs (Multiple vitamin) .... Take 1 tablet by mouth once a day 11)  Levothyroxine Sodium 75 Mcg Tabs (Levothyroxine sodium) .... Take 1 tablet by mouth once a day 12)  Lasix 40 Mg Tabs (Furosemide) .... Take 1 tablet by mouth once a day 13)  Klor-con 10 10 Meq Tbcr (Potassium chloride) .... Take 1 tablet by mouth two times a day 14)  Digitek 0.25 Mg Tabs (Digoxin) .... Take 1 tablet by mouth once a day 15)  Amitriptyline Hcl 50 Mg Tabs (Amitriptyline hcl) .... Take 1 tablet by mouth at bedtime 16)  Fish Oil 1000 Mg Caps (Omega-3 fatty acids) .... Take 1 tablet by mouth twice a day 17)  Relion Blood Glucose Test Strp (Glucose blood) .... To test blood sugar twice daily 18)  Lancets Misc (Lancets) .... To test blood sugar twice daily 19)  Lisinopril 10 Mg Tabs (Lisinopril) .... Take 1 tablet by mouth once a day 20)  Pravachol 20 Mg Tabs (Pravastatin sodium) .... Take 1/2 tab by mouth before bedtime and increase to a full tab at bedtime after one week.  Other Orders: T-Lipid Profile ([id number])  Patient Instructions: 1)  Please make a followup appointment in 2-3 months. 2)  You will be called with any abnormal labwork.  Please make sure your phone number is correct at the front desk. 3)  Great job with going to [facility]! 4)  If you need  anything sooner just call. Prescriptions: DARVOCET-N 100 100-650 MG TABS (PROPOXYPHENE N-APAP) Take 1  tablet by mouth every 4 hours as needed for pain  #180 x 3   Entered and Authorized by:   Joaquin Courts  MD   Signed by:   Joaquin Courts  MD on 08/24/2008   Method used:   Print then Give to Patient   RxID:   [id number] DILTIAZEM HCL CR 180 MG CP24 (DILTIAZEM HCL) Take 1 tablet by mouth once a day  #30 x 11   Entered and Authorized by:   Joaquin Courts  MD   Signed by:   Joaquin Courts  MD on 08/24/2008   Method used:   Electronically to        [facility]* (retail)       [address]       [place], [place]  [address]       Ph: [phone]       Fax: [phone]   RxID:   [id number] LEVOTHYROXINE SODIUM 75 MCG TABS (LEVOTHYROXINE SODIUM) Take 1 tablet by mouth once a day  #30 x 6   Entered and Authorized by:   Joaquin Courts  MD   Signed by:   Joaquin Courts  MD on 08/24/2008   Method used:   Electronically to        [facility]* (retail)       [address]       [place], [place]  [address]       Ph: [phone]       Fax: [phone]   RxID:   [id number] ATENOLOL 25 MG TABS (ATENOLOL) Take 1 tablet by mouth two times a day  #60 x 6   Entered and Authorized by:   Joaquin Courts  MD   Signed by:   Joaquin Courts  MD on 08/24/2008   Method used:   Electronically to        [facility]* (retail)       [address]       [place], [place]  [address]       Ph: [phone]       Fax: [phone]   RxID:   [id number] PRAVACHOL 20 MG TABS (PRAVASTATIN SODIUM) Take 1/2 tab by mouth before bedtime and increase to a full tab at bedtime after one week.  #30 x 3   Entered and Authorized by:   Joaquin Courts  MD   Signed by:   Joaquin Courts  MD on 08/24/2008   Method used:   Electronically to        [facility]* (retail)       [address]       [place], [place]  [address]       Ph:  [phone]       Fax: [phone]   RxID:   [id number]   Prevention & Chronic Care Immunizations   Influenza vaccine: Fluvax 3+  (12/15/2007)   Influenza vaccine deferral: Not available  (08/24/2008)   Influenza vaccine due: 11/07/2008    Tetanus booster: Not documented   Td booster deferral: Deferred  (08/24/2008)    Pneumococcal vaccine: Not documented   Pneumococcal vaccine deferral: Deferred  (08/24/2008)    H. zoster vaccine: Not documented  H. zoster vaccine deferral: Deferred  (08/24/2008)  Colorectal Screening   Hemoccult: Not documented   Hemoccult action/deferral: Ordered  (08/24/2008)    Colonoscopy:  Results: Diverticulosis.         (08/22/2003)   Colonoscopy action/deferral: Repeat colonoscopy in 7 years. 2012  (08/22/2003)   Colonoscopy due: 05/2010  Other Screening   Pap smear: Not documented   Pap smear action/deferral: Not indicated S/P hysterectomy  (08/24/2008)    Mammogram: Not documented   Mammogram due: 10/2008    DXA bone density scan:  Lumbar Spine:  T Score > -1.0 Spine.  Hip Total: T Score -2.5 to -1.0 Hip.      (03/20/2008)   DXA scan due: 03/2010     Smoking status: never  (08/24/2008)  Diabetes Mellitus   HgbA1C: 5.5  (07/02/2008)    Eye exam: Not documented   Last eye exam report requested.    Foot exam: yes  (03/13/2008)   High risk foot: Not documented   Foot care education: Not documented   Foot exam due: 03/13/2009    Urine microalbumin/creatinine ratio: 23.8  (12/15/2007)    Diabetes flowsheet reviewed?: Yes   Progress toward A1C goal: Unchanged  Hypertension   Last Blood Pressure: 118 / 62  (08/24/2008)   Serum creatinine: 0.67  (07/09/2008)   Serum potassium 4.3  (07/09/2008)    Hypertension flowsheet reviewed?: Yes   Progress toward BP goal: At goal  Lipids   Total Cholesterol: 193  (07/09/2008)   Lipid panel action/deferral: Lipid Panel ordered   LDL: See Comment mg/dL  (16/12/9602)   LDL Direct: Not  documented   HDL: 33  (07/09/2008)   Triglycerides: 534  (07/09/2008)    SGOT (AST): 27  (07/09/2008)   SGPT (ALT): 37  (07/09/2008)   Alkaline phosphatase: 89  (07/09/2008)   Total bilirubin: 0.4  (07/09/2008)    Lipid flowsheet reviewed?: Yes   Progress toward LDL goal: Unchanged  Self-Management Support :    Diabetes self-management support: Not documented   Last diabetes self-management training by diabetes educator: 07/25/2007   Last medical nutrition therapy: 03/08/2007    Hypertension self-management support: Not documented    Lipid self-management support: Not documented    Nursing Instructions: Provide Hemoccult cards with instructions (see order) Request report of last diabetic eye exam

## 2010-04-08 NOTE — Progress Notes (Signed)
Summary: med refill/gp  Phone Note Refill Request Message from:  Patient on February 13, 2008 2:24 PM  Refills Requested: Medication #1:  DARVOCET-N 100 100-650 MG TABS Take 1 tablet by mouth every 4 hours as needed for pain Pt states unable to get an appt. until 01/05; she wants to know if she can receive enough to last until then.   Method Requested: Telephone to Pharmacy Initial call taken by: Chinita Pester RN,  February 13, 2008 2:24 PM  Follow-up for Phone Call        That is fine.   Follow-up by: Joaquin Courts  MD,  February 13, 2008 4:56 PM  Additional Follow-up for Phone Call Additional follow up Details #1::        Rx refill called to [facility]. Additional Follow-up by: Chinita Pester RN,  February 14, 2008 11:31 AM      Prescriptions: DARVOCET-N 100 100-650 MG TABS (PROPOXYPHENE N-APAP) Take 1 tablet by mouth every 4 hours as needed for pain  #180 x 0   Entered and Authorized by:   Joaquin Courts  MD   Signed by:   Joaquin Courts  MD on 02/13/2008   Method used:   Telephoned to ...       [facility]* (retail)       [address]       [place], [place]  [address]       Ph: [phone]       Fax: [phone]   RxID:   [id number]

## 2010-04-08 NOTE — Progress Notes (Signed)
Summary: Refill/gh  Phone Note Refill Request Message from:  Fax from Pharmacy on November 07, 2008 8:51 AM  Refills Requested: Medication #1:  GLIPIZIDE 10 MG  TABS one  tab two times a day   Last Refilled: 10/20/2008  Medication #2:  LISINOPRIL 10 MG  TABS Take 1 tablet by mouth once a day   Last Refilled: 10/20/2008  Method Requested: Electronic Initial call taken by: Angelina Ok RN,  November 07, 2008 8:51 AM    Prescriptions: LISINOPRIL 10 MG  TABS (LISINOPRIL) Take 1 tablet by mouth once a day  #30 x 6   Entered and Authorized by:   Joaquin Courts  MD   Signed by:   Joaquin Courts  MD on 11/07/2008   Method used:   Electronically to        [facility]* (retail)       [address]       [place], [place]  [address]       Ph: [phone]       Fax: [phone]   RxID:   [id number] GLIPIZIDE 10 MG  TABS (GLIPIZIDE) one  tab two times a day  #60 x 6   Entered and Authorized by:   Joaquin Courts  MD   Signed by:   Joaquin Courts  MD on 11/07/2008   Method used:   Electronically to        [facility]* (retail)       [address]       [place], [place]  [address]       Ph: [phone]       Fax: [phone]   RxID:   [id number]

## 2010-04-08 NOTE — Letter (Signed)
Summary: Diabetic Instructions  [facility]  [address] [place], [place] [address]   Phone: [phone]  Fax: [phone]    [name] Mar 10, 1950 MRN: [id number]   X   ORAL DIABETIC MEDICATION INSTRUCTIONS  The day before your procedure:   Take your diabetic pill as you do normally  The day of your procedure:   Do not take your diabetic pill    We will check your blood sugar levels during the admission process and again in Recovery before discharging you home

## 2010-04-08 NOTE — Assessment & Plan Note (Signed)
Summary: PER MARY PARKER/SAF  Medications Added OMEPRAZOLE 20 MG CPDR (OMEPRAZOLE) Take 1 tablet by mouth once a day DIGITEK 0.25 MG TABS (DIGOXIN) Take 1 tablet by mouth once a day        Visit Type:  Follow-up Primary Provider:  None  CC:  Atrial Fib.  History of Present Illness: The patient presents for followup of her atrial fibrillation. Since I last saw her she has done relatively well from a cardiovascular standpoint. She does report one episode of chest discomfort about 2 months ago. However, this was self-limited. She otherwise has had none of this. She goes up and down stairs routinely and does not bring on any discomfort with this. She does complain of fatigue but reports that she has a difficult time with sleeping and a difficult time wearing her CPAP. She did have one episode of blurred vision in her left eye a few days ago. It subsequently resolved. She did see her "eye doctor" and had a "normal exam".  She rarely has palpitations but no presyncope or syncope.  Current Medications (verified): 1)  Metformin Hcl 1000 Mg Tabs (Metformin Hcl) .... Take 1 Tablet By Mouth Two Times A Day 2)  Glipizide 10 Mg  Tabs (Glipizide) .... One  Tab Two Times A Day 3)  Diltiazem Hcl Cr 180 Mg Cp24 (Diltiazem Hcl) .... Take 1 Tablet By Mouth Once A Day 4)  Darvocet-N 100 100-650 Mg Tabs (Propoxyphene N-Apap) .... Take 1 Tablet By Mouth Every 4 Hours As Needed For Pain 5)  Warfarin Sodium 5 Mg Tabs (Warfarin Sodium) .... Take As Directed. 6)  Lexapro 10 Mg Tabs (Escitalopram Oxalate) .... Take 1 Tablet By Mouth Once A Day 7)  Atenolol 25 Mg Tabs (Atenolol) .... Take 1 Tablet By Mouth Two Times A Day 8)  Omeprazole 20 Mg Cpdr (Omeprazole) .... Take 1 Tablet By Mouth Once A Day 9)  Calcium 600/vitamin D 600-400 Mg-Unit Tabs (Calcium Carbonate-Vitamin D) .... Take 1 Tablet By Mouth Two Times A Day 10)  Multivitamins Tabs (Multiple Vitamin) .... Take 1 Tablet By Mouth Once A Day 11)  Levothyroxine  Sodium 75 Mcg Tabs (Levothyroxine Sodium) .... Take 1 Tablet By Mouth Once A Day 12)  Lasix 40 Mg Tabs (Furosemide) .... Take 1 Tablet By Mouth Once A Day 13)  Klor-Con 10 10 Meq Tbcr (Potassium Chloride) .... Take 1 Tablet By Mouth Two Times A Day 14)  Digitek 0.25 Mg Tabs (Digoxin) .... Take 1 Tablet By Mouth Once A Day 15)  Amitriptyline Hcl 50 Mg  Tabs (Amitriptyline Hcl) .... Take 1 Tablet By Mouth At Bedtime 16)  Fish Oil 1000 Mg Caps (Omega-3 Fatty Acids) .... Take 1 Tablet By Mouth Twice A Day 17)  Relion Blood Glucose Test   Strp (Glucose Blood) .... To Test Blood Sugar Twice Daily 18)  Lancets   Misc (Lancets) .... To Test Blood Sugar Twice Daily 19)  Lisinopril 10 Mg  Tabs (Lisinopril) .... Take 1 Tablet By Mouth Once A Day 20)  Pravachol 20 Mg Tabs (Pravastatin Sodium) .... Take 1/2 Tab By Mouth Before Bedtime and Increase To A Full Tab At Bedtime After One Week. 21)  Anusol-Hc 25 Mg  Supp (Hydrocortisone Acetate) .... Insert One Supp Into Rectum Once A Day  Allergies: 1)  ! Gemfibrozil (Gemfibrozil) 2)  ! Penicillin 3)  ! Codeine 4)  ! * Latex  Past History:  Past Medical History: Reviewed history from 11/02/2008 and no changes required. Atrial fibrillation Diabetes  mellitus, type II Diverticulosis, colon Hypertension Hypothyroidism Osteoarthritis Esophageal stricture, hx of Hiatal hernia, hx of, s/p Cholecystitis, hx of, s/p cholecystectomy Obstructive sleep apnea Sarcoidosis Diabetic neuropathy Atypical chest pain, hx of Benign right breast mass, hx of (papilloma) Diabetic Neuropathy Fibromyalgia  Past Surgical History: Reviewed history from 11/02/2008 and no changes required. Hiatal hernia repair Cholecystectomy Esophageal dilation x 2 Hysterectomy Excision of milk duct of right breast -> benign (1/08) Hernia Surgery(femoral)  Review of Systems       As stated in the HPI and negative for all other systems.   Vital Signs:  Patient profile:   60  year old female Height:      63 inches Weight:      198 pounds BMI:     35.27 Pulse rate:   96 / minute BP sitting:   121 / 73  (left arm) Cuff size:   large  Vitals Entered By: Oswald Hillock (November 06, 2008 2:18 PM)  Physical Exam  General:  Well developed, well nourished, in no acute distress. Head:  normocephalic and atraumatic Eyes:  PERRLA/EOM intact; conjunctiva and lids normal. Nose:  no deformity, discharge, inflammation, or lesions Mouth:  Teeth, gums and palate normal. Oral mucosa normal. Neck:  Neck supple, no JVD. No masses, thyromegaly or abnormal cervical nodes. Lungs:  Clear bilaterally to auscultation and percussion. Heart:  Non-displaced PMI, chest non-tender; irregular rate and rhythm, S1, S2 without murmurs, rubs or gallops. Carotid upstroke normal, no bruit. Normal abdominal aortic size, no bruits. Femorals normal pulses, no bruits. Pedals normal pulses. No edema, no varicosities. Abdomen:  Bowel sounds positive; abdomen soft and non-tender without masses, organomegaly, or hernias noted. No hepatosplenomegaly. Msk:  Back normal, normal gait. Muscle strength and tone normal. Pulses:  pulses normal in all 4 extremities Extremities:  No clubbing or cyanosis. Neurologic:  Alert and oriented x 3.   Impression & Recommendations:  Problem # 1:  ATRIAL FIBRILLATION (ICD-427.31) The patient has had no new symptoms related to this. She remains on Coumadin. No further cardiovascular testing is suggested. Orders: EKG w/ Interpretation ([address])  Problem # 2:  CAROTID STENOSIS (ICD-433.10)  She has a history of nonobstructive carotid stenosis. She had this last checked in March of 09. Given her recent symptoms I will repeat a carotid Doppler.  Orders: Carotid Duplex (Carotid Duplex)  Problem # 3:  HYPERLIPIDEMIA (ICD-272.4) I reviewed her lipid profile with a total cholesterol 163, triglycerides 327, LDL 63 and HDL 35. Given the absence of coronary disease I  would not add another medicine to her polypharmacy for treatment of her triglycerides. She reports a good diet but I would suggest further reviewing for any fats that could be eliminated. Of note I reviewed her hemoglobin A1c which was 5.5. Therefore, poor blood sugar control does not seem to be contributing to this issue.  Problem # 4:  OBESITY, UNSPECIFIED (ICD-278.00) We discussed this. She has lost weight and gained some back on weight watchers. She is back on this program and is down 8 pounds. I encouraged exercise as well as this diet.  Patient Instructions: 1)  Your physician recommends that you schedule a follow-up appointment in: 12 months with Dr Antoine Poche 2)  Your physician has requested that you have a carotid duplex. This test is an ultrasound of the carotid arteries in your neck. It looks at blood flow through these arteries that supply the brain with blood. Allow one hour for this exam. There are no restrictions or special instructions.

## 2010-04-08 NOTE — Assessment & Plan Note (Signed)
Summary: triage walk-in  Patient presented to clinic asking to be seen.  Patient complains of left neck and shoulder pain x 6 days. Paient in no acute distress.  She has tried heat and OTC meds with no relief. Patient given appt. with Dr. Noel Gerold at 4pm. ..................................................................Marland KitchenDorene Sorrow RN  May 31, 2006 2:12 PM

## 2010-04-08 NOTE — Letter (Signed)
Summary: [facility] [facility]  [employer] [facility]   Imported By: Florinda Marker 09/12/2007 14:27:50  _____________________________________________________________________  External Attachment:    Type:   Image     Comment:   External Document

## 2010-04-08 NOTE — Letter (Signed)
Summary: [facility]   Imported By: Margie Billet 07/04/2009 12:02:28  _____________________________________________________________________  External Attachment:    Type:   Image     Comment:   External Document

## 2010-04-08 NOTE — Procedures (Signed)
Summary: Colonoscopy   Colonoscopy  Procedure date:  10/22/2008  Findings:      Location:  [facility].   COLONOSCOPY PROCEDURE REPORT  PATIENT:  [name], [name]  MR#:  [id number] BIRTHDATE:   [birth year], 58 yrs. old   GENDER:   female  ENDOSCOPIST:   Vania Rea. Jarold Motto, MD, [facility] Referred by:   PROCEDURE DATE:  10/22/2008 PROCEDURE:  Colonoscopy, diagnostic ASA CLASS:   Class III INDICATIONS: rectal bleeding   MEDICATIONS:    Fentanyl 75 mcg IV, Versed 8 mg IV  DESCRIPTION OF PROCEDURE:   After the risks benefits and alternatives of the procedure were thoroughly explained, informed consent was obtained.  Digital rectal exam was performed and revealed no abnormalities.   The LB [id number] endoscope was introduced through the anus and advanced to the cecum, which was identified by both the appendix and ileocecal valve, without limitations.  The quality of the prep was excellent, using MoviPrep.  The instrument was then slowly withdrawn as the colon was fully examined. <<PROCEDUREIMAGES>>  <<OLD IMAGES>>  FINDINGS:  Moderate diverticulosis was found throughout the colon.  No polyps or cancers were seen.  This was otherwise a normal examination of the colon.   Retroflexed views in the rectum revealed no abnormalities.    The scope was then withdrawn from the patient and the procedure completed.  COMPLICATIONS:   None  ENDOSCOPIC IMPRESSION:  1) Moderate diverticulosis throughout the colon  2) No polyps or cancers  3) Otherwise normal examination RECOMMENDATIONS:  1) high fiber diet  REPEAT EXAM:   No   _______________________________ Vania Rea. Jarold Motto, MD, [facility]  CC: Joaquin Courts, MD

## 2010-04-08 NOTE — Progress Notes (Signed)
Summary: phone note  ---- Converted from flag ---- ---- 09/03/2008 8:17 AM, Joaquin Courts  MD wrote: Rivka Barbara, Will you call her and let her know that her liver tests were better and that I just wanted to get one more test on her liver to be thorough?  She does not need an appt w/ an MD for this, just the lab and it can be done anytime in the next two weeks. Thanks! Valerie ------------------------------ Pt. was called and message left to call the clinic. Chinita Pester RN  September 06, 2008 9:35 AM  Pt. was called  and informed of liver tests per Dr.Wilson. Call transferred to [facility] for a lab appt. Chinita Pester RN  September 07, 2008 10:37 AM

## 2010-04-08 NOTE — Progress Notes (Signed)
Summary: Appointment  Phone Note Outgoing Call   Call placed by: Angelina Ok RN,  October 26, 2006 11:56 AM Call placed to: Patient Summary of Call: Call to pt. Msg left for pt to call Clinics to schedule appointment for TSH.  Angelina Ok, RN October 26, 2006 11:56 AM

## 2010-04-08 NOTE — Miscellaneous (Signed)
  Clinical Lists Changes  Observations: Added new observation of US CAROTID: Mild to moderate carotid disease, bilaterally 40-59% bilateral ICA stenosis   f/u 1 year (11/27/2008 10:23)      Carotid Doppler  Procedure date:  11/27/2008  Findings:      Mild to moderate carotid disease, bilaterally 40-59% bilateral ICA stenosis   f/u 1 year

## 2010-04-08 NOTE — Letter (Signed)
Summary: [facility]  [facility]   Imported By: Shon Hough 09/27/2009 14:38:45  _____________________________________________________________________  External Attachment:    Type:   Image     Comment:   External Document

## 2010-04-08 NOTE — Medication Information (Signed)
Summary: [name]  Anticoagulant Therapy  Managed by: Shelby Dubin, PharmD, BCPS, CPP Referring MD: Rollene Rotunda MD PCP: Joaquin Courts  MD Supervising MD: Jens Som MD, Arlys John Indication 1: Atrial Fibrillation (ICD-427.31) Lab Used: [facility] Site: [facility] INR POC 1.6 INR RANGE 2-3  Dietary changes: no    Health status changes: yes       Details: Pt is experiencing headaches  Bleeding/hemorrhagic complications: no    Recent/future hospitalizations: no    Any changes in medication regimen? no    Recent/future dental: no  Any missed doses?: no       Is patient compliant with meds? yes       Current Medications (verified): 1)  Metformin Hcl 1000 Mg Tabs (Metformin Hcl) .... Take 1 Tablet By Mouth Two Times A Day 2)  Glipizide 10 Mg  Tabs (Glipizide) .... One  Tab Two Times A Day 3)  Diltiazem Hcl Cr 180 Mg Cp24 (Diltiazem Hcl) .... Take 1 Tablet By Mouth Once A Day 4)  Darvocet-N 100 100-650 Mg Tabs (Propoxyphene N-Apap) .... Take 1 Tablet By Mouth Every 4 Hours As Needed For Pain 5)  Warfarin Sodium 5 Mg Tabs (Warfarin Sodium) .... Take As Directed. 6)  Lexapro 10 Mg Tabs (Escitalopram Oxalate) .... Take 1 Tablet By Mouth Once A Day 7)  Atenolol 25 Mg Tabs (Atenolol) .... Take 1 Tablet By Mouth Two Times A Day 8)  Omeprazole 20 Mg Cpdr (Omeprazole) .... Take 1 Tablet By Mouth Once A Day 9)  Calcium 600/vitamin D 600-400 Mg-Unit Tabs (Calcium Carbonate-Vitamin D) .... Take 1 Tablet By Mouth Two Times A Day 10)  Multivitamins Tabs (Multiple Vitamin) .... Take 1 Tablet By Mouth Once A Day 11)  Levothyroxine Sodium 75 Mcg Tabs (Levothyroxine Sodium) .... Take 1 Tablet By Mouth Once A Day 12)  Lasix 40 Mg Tabs (Furosemide) .... Take 1 Tablet By Mouth Once A Day 13)  Klor-Con 10 10 Meq Tbcr (Potassium Chloride) .... Take 1 Tablet By Mouth Two Times A Day 14)  Digitek 0.25 Mg Tabs (Digoxin) .... Take 1 Tablet By Mouth Once A Day 15)  Amitriptyline Hcl 50 Mg  Tabs  (Amitriptyline Hcl) .... Take 1 Tablet By Mouth At Bedtime 16)  Fish Oil 1000 Mg Caps (Omega-3 Fatty Acids) .... Take 1 Tablet By Mouth Twice A Day 17)  Relion Blood Glucose Test   Strp (Glucose Blood) .... To Test Blood Sugar Twice Daily 18)  Lancets   Misc (Lancets) .... To Test Blood Sugar Twice Daily 19)  Lisinopril 10 Mg  Tabs (Lisinopril) .... Take 1 Tablet By Mouth Once A Day 20)  Pravachol 20 Mg Tabs (Pravastatin Sodium) .... Take 1/2 Tab By Mouth Before Bedtime and Increase To A Full Tab At Bedtime After One Week. 21)  Anusol-Hc 25 Mg  Supp (Hydrocortisone Acetate) .... Insert One Supp Into Rectum Once A Day  Allergies (verified): 1)  ! Gemfibrozil (Gemfibrozil) 2)  ! Penicillin 3)  ! Codeine 4)  ! * Latex  Anticoagulation Management History:      The patient is taking warfarin and comes in today for a routine follow up visit.  Positive risk factors for bleeding include history of CVA/TIA and presence of serious comorbidities.  Negative risk factors for bleeding include an age less than 76 years old.  The bleeding index is 'intermediate risk'.  Positive CHADS2 values include History of HTN, History of Diabetes, and Prior Stroke/CVA/TIA.  Negative CHADS2 values include Age > 68 years old.  The start date was 07/22/2004.  Her last INR was 1.6.  Anticoagulation responsible provider: Jens Som MD, Arlys John.  INR POC: 1.6.  Cuvette Lot#: [id number].  Exp: 12/2009.    Anticoagulation Management Assessment/Plan:      The patient's current anticoagulation dose is Warfarin sodium 5 mg tabs: Take as directed..  The target INR is 2 - 3.  The next INR is due 11/09/2008.  Anticoagulation instructions were given to patient.  Results were reviewed/authorized by Shelby Dubin, PharmD, BCPS, CPP.  She was notified by Bo Merino pharmD candidate.         Prior Anticoagulation Instructions: INR 1.4  take 1.5 tablets (7.5mg ) x 2 doses.  Then off x 5 days prior to procedure.  When you resume coumadin after  colonoscopy take 7.5mg  (1.5 tablets) x 2 doses then resume normal dosage of 1 tablet daily except 1/2 tablet on Saturdays.    Current Anticoagulation Instructions: INR today is 1.6 Take 7.5mg s today and tomorow, then continue on the same regimen. Recheck in 2 weeks

## 2010-04-08 NOTE — Assessment & Plan Note (Signed)
Summary: ROUTINE CK/EST/VS   Vital Signs:  Patient profile:   60 year old female Height:      63 inches (160.02 cm) Weight:      203.3 pounds (92.41 kg) BMI:     36.14 Temp:     98.1 degrees F (36.72 degrees C) oral Pulse rate:   77 / minute BP sitting:   106 / 65  (right arm)  Vitals Entered By: Filomena Jungling NT II (July 02, 2008 1:43 PM) CC: check-up Is Patient Diabetic? Yes  Nutritional Status BMI of > 30 = obese CBG Result 179  Have you ever been in a relationship where you felt threatened, hurt or afraid?No   Does patient need assistance? Functional Status Self care Ambulation Normal   Primary Care Provider:  Joaquin Courts  MD  CC:  check-up.  History of Present Illness: Pt is a 60 yo female w/ past medical history below here for routine f/u.  She notes having a nose bleed last p.m. after she blew her nose.  It eventually resolved w/ pressure and ice after an hour but she is concerned she lost a lot of blood.  No orthostatic symptoms.  She had one other one 2-3 weeks ago.  She had her INR checked today at Dr. Jenene Slicker office and it was normal at 2.4 and has the card w/ her.  She notes that her father died one month ago unexpectedly from a stroke at age 41.  She is having a lot of personal stresses related to his death.  She notes she has lost weight recently but attributes it to her dad's death.  No siginficant feelings of depression or SI.  She has been seeing her psychiatrist at [facility] health is doing ok.    She checks her blood sugars infrequently but they have been ok.  She had one low but isn't sure how low but it occured after she hadn't eaten anything all day.  She has an eye appt in june for diabetes eye exam.    Preventive Screening-Counseling & Management     Smoking Status: never     Does Patient Exercise: yes     Type of exercise: WALKING  Current Medications (verified): 1)  Metformin Hcl 1000 Mg Tabs (Metformin Hcl) .... Take 1 Tablet By Mouth  Two Times A Day 2)  Glipizide 10 Mg  Tabs (Glipizide) .... One  Tab Two Times A Day 3)  Diltiazem Hcl Cr 180 Mg Cp24 (Diltiazem Hcl) .... Take 1 Tablet By Mouth Once A Day 4)  Darvocet-N 100 100-650 Mg Tabs (Propoxyphene N-Apap) .... Take 1 Tablet By Mouth Every 4 Hours As Needed For Pain 5)  Warfarin Sodium 5 Mg Tabs (Warfarin Sodium) .... Take As Directed. 6)  Lexapro 10 Mg Tabs (Escitalopram Oxalate) .... Take 1 Tablet By Mouth Once A Day 7)  Atenolol 25 Mg Tabs (Atenolol) .... Take 1 Tablet By Mouth Two Times A Day 8)  Omeprazole 20 Mg Cpdr (Omeprazole) .... Take 1 Tablet By Mouth Once A Day 9)  Calcium 600/vitamin D 600-400 Mg-Unit Tabs (Calcium Carbonate-Vitamin D) .... Take 1 Tablet By Mouth Two Times A Day 10)  Multivitamins Tabs (Multiple Vitamin) .... Take 1 Tablet By Mouth Once A Day 11)  Levothyroxine Sodium 75 Mcg Tabs (Levothyroxine Sodium) .... Take 1 Tablet By Mouth Once A Day 12)  Lasix 40 Mg Tabs (Furosemide) .... Take 1 Tablet By Mouth Once A Day 13)  Klor-Con 10 10 Meq Tbcr (Potassium  Chloride) .... Take 1 Tablet By Mouth Two Times A Day 14)  Digitek 0.25 Mg Tabs (Digoxin) .... Take 1 Tablet By Mouth Once A Day 15)  Amitriptyline Hcl 50 Mg  Tabs (Amitriptyline Hcl) .... Take 1 Tablet By Mouth At Bedtime 16)  Fish Oil 1000 Mg Caps (Omega-3 Fatty Acids) .... Take 1 Tablet By Mouth Twice A Day 17)  Relion Blood Glucose Test   Strp (Glucose Blood) .... To Test Blood Sugar Twice Daily 18)  Lancets   Misc (Lancets) .... To Test Blood Sugar Twice Daily 19)  Lisinopril 10 Mg  Tabs (Lisinopril) .... Take 1 Tablet By Mouth Once A Day  Allergies (verified): 1)  ! Gemfibrozil (Gemfibrozil) 2)  ! Penicillin 3)  ! Codeine 4)  ! * Latex  Past History:  Past Medical History:    Atrial fibrillation    Diabetes mellitus, type II    Diverticulosis, colon    Hypertension    Hypothyroidism    Osteoarthritis    Esophageal stricture, hx of    Hiatal hernia, hx of, s/p     Cholecystitis, hx of, s/p cholecystectomy    Obstructive sleep apnea    Sarcoidosis    Diabetic neuropathy    Atypical chest pain, hx of    Benign right breast mass, hx of (papilloma)    Diabetic Neuropathy (05/07/2006)  Past Surgical History:    Hiatal hernia repair    Cholecystectomy    Esophageal dilation x 2    Hysterectomy    Excision of milk duct of right breast -> benign (1/08)     (04/05/2006)  Social History:    Never Smoked    Alcohol use-no    Drug use-no    Divorced    On disability (04/05/2006)  Risk Factors:    Smoking Status: never (07/02/2008)    Packs/Day: N/A    Cigars/wk: N/A    Pipe Use/wk: N/A    Cans of tobacco/wk: N/A    Passive Smoke Exposure: N/A  Family History:    Reviewed history from 12/15/2007 and no changes required:       Maternal uncle w/ dementia.    Social History:    Reviewed history from 04/05/2006 and no changes required:       Never Smoked       Alcohol use-no       Drug use-no       Divorced       On disability  Physical Exam  General:  alert, pleasant, no distress.   Eyes:  anicteric. Nose:  no areas of scabbing or evidence of bleeding. Mouth:  MMM, teeth in the bottom L missing. Lungs:  CTA bilaterally, nl effort.  Heart:  Irreg irreg, reg rate, no m/r/g. Abdomen:  +BS's, soft, NT, and ND. Extremities:  trace lower extremities. Cervical Nodes:  No lymphadenopathy noted Psych:  mood euthymic.    Impression & Recommendations:  Problem # 1:  DIABETES MELLITUS, TYPE II (ICD-250.00) Appt in June for diabetes eye exam.  A1c down and attributed it to decreased eating/weight loss/stress w/ dad's passing.  Offered encouragement for continued portion control and weight loss.  We may be able to cut the glipizide at her next visit if her a1c remains so low and she cont w/ weight loss.  I am hesitant to do so at this pt b/c she will likely re-gain the weight as the grieving process proceeds.  At her next visit, if her a1c is  still <6.0, will  d/c glipizide.  Cont ACE inh.  BP at goal.  F/u lipids, goal LDL < 70, not on statin b/c of elevated transaminases in the past but if LDL sign above goal (ie >100) may consider starting.  Diabetic foot check 01/10.  Her updated medication list for this problem includes:    Metformin Hcl 1000 Mg Tabs (Metformin hcl) .Marland Kitchen... Take 1 tablet by mouth two times a day    Glipizide 10 Mg Tabs (Glipizide) ..... One  tab two times a day    Lisinopril 10 Mg Tabs (Lisinopril) .Marland Kitchen... Take 1 tablet by mouth once a day  Orders: T- Capillary Blood Glucose (16109) T-Hgb A1C (in-house) ([id number])  Labs Reviewed: Creat: 0.67 (03/16/2008)    Reviewed HgBA1c results: 5.5 (07/02/2008)  6.2 (03/13/2008)  Problem # 2:  HYPERTENSION (ICD-401.9) At goal.  F/u chemistries.  Her updated medication list for this problem includes:    Diltiazem Hcl Cr 180 Mg Cp24 (Diltiazem hcl) .Marland Kitchen... Take 1 tablet by mouth once a day    Atenolol 25 Mg Tabs (Atenolol) .Marland Kitchen... Take 1 tablet by mouth two times a day    Lasix 40 Mg Tabs (Furosemide) .Marland Kitchen... Take 1 tablet by mouth once a day    Lisinopril 10 Mg Tabs (Lisinopril) .Marland Kitchen... Take 1 tablet by mouth once a day  Problem # 3:  HYPERTRIGLYCERIDEMIA (ICD-272.1)  F/u fasting lipids.  Did not tolerate niacin.    The following medications were removed from the medication list:    Niacin Cr 500 Mg Cr-caps (Niacin) .Marland Kitchen... Take 1 tab by mouth at bedtime.  Labs Reviewed: SGOT: 35 (03/16/2008)   SGPT: 49 (03/16/2008)   HDL:35 (03/16/2008), 39 (12/15/2007)  LDL:87 (03/16/2008), See Comment mg/dL (81/19/1478)  GNFA:213 (03/16/2008), 200 (12/15/2007)  Trig:384 (03/16/2008), 414 (12/15/2007)  Problem # 4:  HYPERLIPIDEMIA (ICD-272.4) F/u LFT's, fasting lipids.  May be worth starting statin if transaminitis only  mild and LDL above goal.  The following medications were removed from the medication list:    Niacin Cr 500 Mg Cr-caps (Niacin) .Marland Kitchen... Take 1 tab by mouth at  bedtime.  Future Orders: T-Lipid Profile [id number]) ... 07/03/2008 T-Comprehensive Metabolic Panel [id number]) ... 07/03/2008  Labs Reviewed: SGOT: 35 (03/16/2008)   SGPT: 49 (03/16/2008)   HDL:35 (03/16/2008), 39 (12/15/2007)  LDL:87 (03/16/2008), See Comment mg/dL (40/12/2723)  DGUY:403 (03/16/2008), 200 (12/15/2007)  Trig:384 (03/16/2008), 414 (12/15/2007)  Problem # 5:  HYPOTHYROIDISM (ICD-244.9) F/u TSH.  Her updated medication list for this problem includes:    Levothyroxine Sodium 75 Mcg Tabs (Levothyroxine sodium) .Marland Kitchen... Take 1 tablet by mouth once a day  Future Orders: T-TSH ([id number]) ... 07/04/2008  Problem # 6:  OSTEOPENIA (ICD-733.90) Cont ca + D.  Problem # 7:  ATRIAL FIBRILLATION (ICD-427.31) Rate controlled, on coumadin, INR today 2.4.  Her updated medication list for this problem includes:    Diltiazem Hcl Cr 180 Mg Cp24 (Diltiazem hcl) .Marland Kitchen... Take 1 tablet by mouth once a day    Warfarin Sodium 5 Mg Tabs (Warfarin sodium) .Marland Kitchen... Take as directed.    Atenolol 25 Mg Tabs (Atenolol) .Marland Kitchen... Take 1 tablet by mouth two times a day    Digitek 0.25 Mg Tabs (Digoxin) .Marland Kitchen... Take 1 tablet by mouth once a day  Orders: T-CBC No Diff ([id number])  Problem # 8:  EPISTAXIS (ICD-784.7) F/u CBC and look at hgb/plts but it doesn't sound like she lost a severe amount of blood.  Started after picking/blowing her nose and advised on technifques that would avoid trauma.  Likely from coumadin, INR therapuetic so no need to adjust dose.    Problem # 9:  Preventive Health Care (ICD-V70.0) Colonoscopy 03/05 w/ diverticular dz, f/u stool cards.  Mamm 8/09 and repeat due 9/10.  bone dens 1/10 c/w osteopenia, repeat 1/12.  S/p hysterectomy so no need for paps.  Orders: T-Hemoccult Card-Multiple (take home) ([id number])  Problem # 10:  BACK PAIN, LUMBAR (ICD-724.2) On darvocet chronically at low dose for presumed mild OA.  OK to cont to refill.  Her updated medication list for this  problem includes:    Darvocet-n 100 100-650 Mg Tabs (Propoxyphene n-apap) .Marland Kitchen... Take 1 tablet by mouth every 4 hours as needed for pain  Complete Medication List: 1)  Metformin Hcl 1000 Mg Tabs (Metformin hcl) .... Take 1 tablet by mouth two times a day 2)  Glipizide 10 Mg Tabs (Glipizide) .... One  tab two times a day 3)  Diltiazem Hcl Cr 180 Mg Cp24 (Diltiazem hcl) .... Take 1 tablet by mouth once a day 4)  Darvocet-n 100 100-650 Mg Tabs (Propoxyphene n-apap) .... Take 1 tablet by mouth every 4 hours as needed for pain 5)  Warfarin Sodium 5 Mg Tabs (Warfarin sodium) .... Take as directed. 6)  Lexapro 10 Mg Tabs (Escitalopram oxalate) .... Take 1 tablet by mouth once a day 7)  Atenolol 25 Mg Tabs (Atenolol) .... Take 1 tablet by mouth two times a day 8)  Omeprazole 20 Mg Cpdr (Omeprazole) .... Take 1 tablet by mouth once a day 9)  Calcium 600/vitamin D 600-400 Mg-unit Tabs (Calcium carbonate-vitamin d) .... Take 1 tablet by mouth two times a day 10)  Multivitamins Tabs (Multiple vitamin) .... Take 1 tablet by mouth once a day 11)  Levothyroxine Sodium 75 Mcg Tabs (Levothyroxine sodium) .... Take 1 tablet by mouth once a day 12)  Lasix 40 Mg Tabs (Furosemide) .... Take 1 tablet by mouth once a day 13)  Klor-con 10 10 Meq Tbcr (Potassium chloride) .... Take 1 tablet by mouth two times a day 14)  Digitek 0.25 Mg Tabs (Digoxin) .... Take 1 tablet by mouth once a day 15)  Amitriptyline Hcl 50 Mg Tabs (Amitriptyline hcl) .... Take 1 tablet by mouth at bedtime 16)  Fish Oil 1000 Mg Caps (Omega-3 fatty acids) .... Take 1 tablet by mouth twice a day 17)  Relion Blood Glucose Test Strp (Glucose blood) .... To test blood sugar twice daily 18)  Lancets Misc (Lancets) .... To test blood sugar twice daily 19)  Lisinopril 10 Mg Tabs (Lisinopril) .... Take 1 tablet by mouth once a day  Patient Instructions: 1)  Please make a followup appointment in 3 months. 2)  Please come back to get your cholesterol  checked. 3)  Great job losing weight! 4)  Please return your stool cards.  5)  You will be called with any abnormal labwork.  Please make sure your phone number is correct at the front desk.       Last LDL:                                                 87 (03/16/2008 8:38:00 PM)      Laboratory Results   Blood Tests   Date/Time Received: July 02, 2008 2:04 PM  Date/Time Reported: [name]  July 02, 2008 2:04 PM   HGBA1C:  5.5%   (Normal Range: Non-Diabetic - 3-6%   Control Diabetic - 6-8%) CBG Random:: 179mg /dL

## 2010-04-08 NOTE — Assessment & Plan Note (Signed)
Summary: EST-6 MONTH RECHECK/CH   Vital Signs:  Patient Profile:   60 Years Old Female Height:     63 inches (160.02 cm) Weight:      216.6 pounds Temp:     97.7 degrees F oral Pulse rate:   77 / minute BP sitting:   134 / 75  (right arm)  Pt. in pain?   yes    Location:   left lower stomach    Intensity:   9    Type:       aching  Vitals Entered ByFilomena Jungling (January 27, 2007 1:47 PM)              Is Patient Diabetic? Yes  CBG Result 325  Does patient need assistance? Functional Status Self care Ambulation Normal     PCP:  Kimberly Pitt, MD  Chief Complaint:  Back Pain.  History of Present Illness: [name] is here today for a scheduled followup examination. Her only complaint today is back pain that began 1 month ago over the right side of her lower back. She has been unable to check her CBGs because of the high cost of test strips. Today CBG was 325. She also needs medication refills.     Current Allergies: ! GEMFIBROZIL (GEMFIBROZIL) ! PENICILLIN ! CODEINE ! * LATEX     Review of Systems  The patient denies fever, weight loss, chest pain, dyspnea on exhertion, abdominal pain, melena, hematochezia, and severe indigestion/heartburn.     Physical Exam  General:     Well-developed,well-nourished,in no acute distress; alert,appropriate and cooperative throughout examination Eyes:     No corneal or conjunctival inflammation noted. EOMI. Perrla. Vision grossly normal. Neck:     No bruits/goiter. Lungs:     Normal respiratory effort, chest expands symmetrically. Lungs are clear to auscultation, no crackles or wheezes. Heart:     normal rate, no murmur, no gallop, and irregular rhythm.   Abdomen:     Bowel sounds positive,abdomen soft and non-tender without masses, organomegaly or hernias noted. Extremities:     No clubbing, cyanosis, edema, or deformity noted with normal full range of motion of all joints.      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 Derrell Lolling 2/2 nipple bleeding; pathology was benign per patient. Will try to get records today.  Problem # 2:  DIABETES MELLITUS, TYPE II (ICD-250.00) Have increased her glipizide to 10 mg two times a day. Have asked her to check CBGs once a day at different times. I have asked her to get the [employer] meter as it has the lowest cost for test strips. I will see her back in 1 month to see if any improvement in her CBGs. If not, consider initiating insulin. Microalbumin today. Inquire about eye checks during next visit.  Her updated medication list for this problem includes:    Metformin Hcl 1000 Mg Tabs (Metformin hcl) .Marland Kitchen... Take 1 tablet by mouth two times a day    Glipizide 10 Mg Tabs (Glipizide) ..... One  tab two times a day  Future Orders: T-Comprehensive Metabolic Panel [id number]) ... 01/28/2007 T-Lipid Profile [id number]) ... 01/28/2007 T-CBC No Diff ([id number]) ... 01/28/2007 T-Urine Microalbumin w/creat. ratio [id number] / [id number]) ... 01/28/2007  Labs Reviewed: HgBA1c: 7.6 (01/27/2007)   Creat: 0.64 (08/10/2006)      Problem # 3:  HYPOTHYROIDISM (ICD-244.9) On stable dose  of synthroid. Recheck TSH in 08/09.  Her updated medication list for this problem includes:    Levothyroxine Sodium 75 Mcg Tabs (Levothyroxine sodium) .Marland Kitchen... Take 1 tablet by mouth once a day  Future Orders: T-Comprehensive Metabolic Panel ([id number]) ... 01/28/2007 T-Lipid Profile [id number]) ... 01/28/2007 T-CBC No Diff ([id number]) ... 01/28/2007 T-Urine Microalbumin w/creat. ratio [id number] / [id number]) ... 01/28/2007  Labs Reviewed: TSH: 1.022 (10/27/2006)    HgBA1c: 7.6 (01/27/2007) Chol: 215 (06/28/2006)   HDL: 35 (06/28/2006)   LDL: See Comment mg/dL (69/62/9528)   TG: 413 (06/28/2006)   Problem # 4:  ATRIAL FIBRILLATION  (ICD-427.31) No palpitations;not symptomatic. Followed by Dr. Antoine Poche with [facility]. She has her INR checked there. Was 2.6 last week. Rate controlled with atenolol and digoxin.  Her updated medication list for this problem includes:    Warfarin Sodium 5 Mg Tabs (Warfarin sodium) .Marland Kitchen... Take 1 tablet by mouth once a day    Atenolol 25 Mg Tabs (Atenolol) .Marland Kitchen... Take 1 tablet by mouth two times a day    Digitek 0.25 Mg Tabs (Digoxin) .Marland Kitchen... Take 1 tablet by mouth once a day  Future Orders: T-Comprehensive Metabolic Panel ([id number]) ... 01/28/2007 T-Lipid Profile [id number]) ... 01/28/2007 T-CBC No Diff ([id number]) ... 01/28/2007 T-Urine Microalbumin w/creat. ratio [id number] [id number] / [id number]) ... 01/28/2007   Problem # 5:  HYPERTENSION (ICD-401.9) BP ok.Continue current regimen.  Her updated medication list for this problem includes:    Diltiazem Hcl Cr 180 Mg Cp24 (Diltiazem hcl) .Marland Kitchen... Take 1 tablet by mouth once a day    Atenolol 25 Mg Tabs (Atenolol) .Marland Kitchen... Take 1 tablet by mouth two times a day    Lasix 40 Mg Tabs (Furosemide) .Marland Kitchen... Take 1 tablet by mouth once a day  BP today: 134/75 Prior BP: 121/78 (08/10/2006)  Labs Reviewed: Creat: 0.64 (08/10/2006) Chol: 215 (06/28/2006)   HDL: 35 (06/28/2006)   LDL: See Comment mg/dL (95/18/8416)   TG: 606 (06/28/2006)   Problem # 6:  HEALTH MAINTENANCE EXAM (ICD-V70.0) Has had complete hysterectomy. (no more PAP smears). Will get DEXA scan as she is 25 years post-menopausal. FLP ordered. Mammograms up to date. Per patient had colonoscopy 3 years ago....will obtain result.   Complete Medication List: 1)  Metformin Hcl 1000 Mg Tabs (Metformin hcl) .... Take 1 tablet by mouth two times a day 2)  Glipizide 10 Mg Tabs (Glipizide) .... One  tab two times a day 3)  Diltiazem Hcl Cr 180 Mg Cp24 (Diltiazem hcl) .... Take 1 tablet by mouth once a day 4)  Darvocet-n 100 100-650 Mg Tabs (Propoxyphene n-apap) .... Take 1 tablet by mouth every 4 hours as  needed for pain 5)  Warfarin Sodium 5 Mg Tabs (Warfarin sodium) .... Take 1 tablet by mouth once a day 6)  Lexapro 10 Mg Tabs (Escitalopram oxalate) .... Take 1 tablet by mouth once a day 7)  Atenolol 25 Mg Tabs (Atenolol) .... Take 1 tablet by mouth two times a day 8)  Omeprazole 20 Mg Cpdr (Omeprazole) .... Take 1 tablet by mouth once a day 9)  Calcarb 600/d 600-125 Mg-unit Tabs (Calcium-vitamin d) .... Take 1 tablet by mouth two times a day 10)  Multivitamins Tabs (Multiple vitamin) .... Take 1 tablet by mouth once a day 11)  Levothyroxine Sodium 75 Mcg Tabs (Levothyroxine sodium) .... Take 1 tablet by mouth once a day 12)  Lasix 40 Mg Tabs (Furosemide) .... Take 1 tablet by mouth once a day 13)  Klor-con 10 10 Meq Tbcr (Potassium  chloride) .... Take 1 tablet by mouth two times a day 14)  Digitek 0.25 Mg Tabs (Digoxin) .... Take 1 tablet by mouth once a day 15)  Amitriptyline Hcl 50 Mg Tabs (Amitriptyline hcl) .... Take 1 tablet by mouth at bedtime 16)  Fish Oil 1000 Mg Caps (Omega-3 fatty acids) .... Take 1 tablet by mouth once a day 17)  Flexeril 5 Mg Tabs (Cyclobenzaprine hcl) .... Take 1 tab by mouth at bedtime for 5 days  Other Orders: T- Capillary Blood Glucose ([id number]) T-Hgb A1C (in-house) (16109UE)   Patient Instructions: 1)  Please schedule a follow-up appointment in 1 month. 2)  Increase the glipizide to 10 mg 1 full tablet twice a day. 3)  Start checking your sugars once a day at different times and don't forget to bring them in at your next visit.    Prescriptions: FLEXERIL 5 MG  TABS (CYCLOBENZAPRINE HCL) Take 1 tab by mouth at bedtime for 5 days  #5 x 0   Entered and Authorized by:   Kimberly Pitt MD   Signed by:   Kimberly Pitt MD on 01/27/2007   Method used:   Print then Give to Patient   RxID:   [id number] KLOR-CON 10 10 MEQ TBCR (POTASSIUM CHLORIDE) Take 1 tablet by mouth two times a day  #62 x 6   Entered and Authorized by:   Kimberly Pitt MD    Signed by:   Kimberly Pitt MD on 01/27/2007   Method used:   Print then Give to Patient   RxID:   [id number] LASIX 40 MG TABS (FUROSEMIDE) Take 1 tablet by mouth once a day  #31 x 6   Entered and Authorized by:   Kimberly Pitt MD   Signed by:   Kimberly Pitt MD on 01/27/2007   Method used:   Print then Give to Patient   RxID:   [id number] DARVOCET-N 100 100-650 MG TABS (PROPOXYPHENE N-APAP) Take 1 tablet by mouth every 4 hours as needed for pain  #120 x 0   Entered and Authorized by:   Kimberly Pitt MD   Signed by:   Kimberly Pitt MD on 01/27/2007   Method used:   Print then Give to Patient   RxID:   [id number] DILTIAZEM HCL CR 180 MG CP24 (DILTIAZEM HCL) Take 1 tablet by mouth once a day  #31 x 6   Entered and Authorized by:   Kimberly Pitt MD   Signed by:   Kimberly Pitt MD on 01/27/2007   Method used:   Print then Give to Patient   RxID:   [id number] METFORMIN HCL 1000 MG TABS (METFORMIN HCL) Take 1 tablet by mouth two times a day  #62 x 6   Entered and Authorized by:   Kimberly Pitt MD   Signed by:   Kimberly Pitt MD on 01/27/2007   Method used:   Print then Give to Patient   RxID:   [id number]  ]  Vital Signs:  Patient Profile:   60 Years Old Female Height:     63 inches (160.02 cm) Weight:      216.6 pounds Temp:     97.7 degrees F oral Pulse rate:   77 / minute BP sitting:   134 / 75    Location:   left lower stomach    Intensity:   9    Type:       aching             CBG Result  325      Laboratory Results   Blood Tests   Date/Time Recieved: January 27, 2007 2:12 PM  Date/Time Reported: ..................................................................Marland KitchenAlric Quan  January 27, 2007 2:12 PM   HGBA1C: 7.6%   (Normal Range: Non-Diabetic - 3-6%   Control Diabetic - 6-8%) CBG Random: 325        Laboratory Results   Blood Tests     HGBA1C: 7.6%   (Normal Range: Non-Diabetic - 3-6%    Control Diabetic - 6-8%) CBG Random: 325

## 2010-04-08 NOTE — Procedures (Signed)
Summary: [facility] Colonoscopy  [facility] Colonoscopy   Imported By: Florinda Marker 03/16/2008 16:16:45  _____________________________________________________________________  External Attachment:    Type:   Image     Comment:   External Document  Appended Document: [facility] Colonoscopy    Clinical Lists Changes  Observations: Added new observation of COLONNXTDUE: 05/2010 (03/20/2008 14:58) Added new observation of EGD: Findings: Gastritis , hiatal hernia. (05/07/2005 14:59) Added new observation of COLONRECACT: Repeat colonoscopy in 7 years.   (05/08/2003 15:00) Added new observation of COLONOSCOPY:  Results: Diverticulosis.        (05/08/2003 15:00)       EGD  Procedure date:  05/07/2005  Findings:      Findings: Gastritis , hiatal hernia.  Colonoscopy  Procedure date:  05/08/2003  Findings:       Results: Diverticulosis.         Comments:      Repeat colonoscopy in 7 years.    Procedures Next Due Date:    Colonoscopy: 05/2010   EGD  Procedure date:  05/07/2005  Findings:      Findings: Gastritis , hiatal hernia.  Colonoscopy  Procedure date:  05/08/2003  Findings:       Results: Diverticulosis.         Comments:      Repeat colonoscopy in 7 years.    Procedures Next Due Date:    Colonoscopy: 05/2010

## 2010-04-08 NOTE — Assessment & Plan Note (Signed)
Summary: cold,cough,bloody mucous x 3days/pcp-hernandez/hla   Vital Signs:  Patient Profile:   60 Years Old Female Height:     63 inches (160.02 cm) Weight:      206.1 pounds (93.68 kg) BMI:     36.64 Temp:     97.2 degrees F Pulse rate:   104 / minute Pulse (ortho):   74 / minute BP sitting:   118 / 71  (right arm) BP standing:   112 / 63  (right arm)  Pt. in pain?   yes    Location:   chest and head    Intensity:   0  Vitals Entered By: Dorie Rank RN (August 09, 2007 4:14 PM)              Is Patient Diabetic? Yes  Nutritional Status BMI of > 30 = obese CBG Result 166  Does patient need assistance? Functional Status Self care Ambulation Normal Comments tried OTC meds like generic Nyquil     PCP:  Peggye Pitt, MD  Chief Complaint:  URI - c/o cough and chest tightness starting Sunday night - coughed up blood tinged mucous in the am - hears"rattling" in chest and head "feels like it is in a vice"- c/o dizziness on and off - also clear nasal congestion-  eyes watery and nose running.  History of Present Illness: Pt presents today with cough x2 days.  Cough is productive for dark green sputum.  occasional red tinged sputum in am.  Pt reports diaphoresis.  Pt with subjective fever yesterday.  No sick contacts recently.  No sore throat, + rhinorrhea.  Head congestion worse with coughing.  SOB at baseline currently no worse than normal.  No CP.  + nausea.  No vomiting or diarrhea.   Pt has been taking dextromethorphan without relief.      Current Allergies: ! GEMFIBROZIL (GEMFIBROZIL) ! PENICILLIN ! CODEINE ! * LATEX    Risk Factors: Tobacco use:  never Drug use:  no Alcohol use:  no   Review of Systems       The patient complains of fever, hoarseness, prolonged cough, and hemoptysis.  The patient denies anorexia, weight loss, vision loss, decreased hearing, chest pain, syncope, dyspnea on exertion, peripheral edema, headaches, abdominal pain, melena, and  hematochezia.     Physical Exam  General:     Well-developed,well-nourished,in no acute distress; alert,appropriate and cooperative throughout examination Head:     Normocephalic and atraumatic without obvious abnormalities. No apparent alopecia or balding. Eyes:     No corneal or conjunctival inflammation noted. EOMI. Perrla. Vision grossly normal. Ears:     no abnormalities Nose:     no external deformity, no external erythema, and no nasal discharge.   Mouth:     good dentition, pharynx pink and moist, no erythema, and no exudates.   Lungs:     Normal respiratory effort, chest expands symmetrically. Lungs are clear to auscultation, no crackles or wheezes. Heart:     Normal rate and regular rhythm. S1 and S2 normal without gallop, murmur, click, rub or other extra sounds. Abdomen:     Bowel sounds positive,abdomen soft and non-tender without masses, organomegaly or hernias noted. Msk:     No pain to palpation of spinal or paraspinal muscles. Extremities:     No clubbing, cyanosis, edema. Neurologic:     non focal    Impression & Recommendations:  Problem # 1:  URI (ICD-465.9) Pt with likely viral URI.  Will treat with  guaifenesin, and pseudoephedrine.  Pt has allergy to codiene.  If symptoms to do not improve in 3-4 days told to contact clinic.    Her updated medication list for this problem includes:    Robitussin Dm 100-10 Mg/82ml Syrp (Dextromethorphan-guaifenesin) .Marland Kitchen... 1 tsp every 4 hours as need for cough   Medications Added to Medication List This Visit: 1)  Robitussin Dm 100-10 Mg/17ml Syrp (Dextromethorphan-guaifenesin) .Marland Kitchen.. 1 tsp every 4 hours as need for cough  Complete Medication List: 1)  Metformin Hcl 1000 Mg Tabs (Metformin hcl) .... Take 1 tablet by mouth two times a day 2)  Glipizide 10 Mg Tabs (Glipizide) .... One  tab two times a day 3)  Diltiazem Hcl Cr 180 Mg Cp24 (Diltiazem hcl) .... Take 1 tablet by mouth once a day 4)  Darvocet-n 100 100-650 Mg  Tabs (Propoxyphene n-apap) .... Take 1 tablet by mouth every 4 hours as needed for pain 5)  Warfarin Sodium 5 Mg Tabs (Warfarin sodium) .... Take 1 tablet by mouth once a day 6)  Lexapro 10 Mg Tabs (Escitalopram oxalate) .... Take 1 tablet by mouth once a day 7)  Atenolol 25 Mg Tabs (Atenolol) .... Take 1 tablet by mouth two times a day 8)  Omeprazole 20 Mg Cpdr (Omeprazole) .... Take 1 tablet by mouth once a day 9)  Calcarb 600/d 600-125 Mg-unit Tabs (Calcium-vitamin d) .... Take 1 tablet by mouth two times a day 10)  Multivitamins Tabs (Multiple vitamin) .... Take 1 tablet by mouth once a day 11)  Levothyroxine Sodium 75 Mcg Tabs (Levothyroxine sodium) .... Take 1 tablet by mouth once a day 12)  Lasix 40 Mg Tabs (Furosemide) .... Take 1 tablet by mouth once a day 13)  Klor-con 10 10 Meq Tbcr (Potassium chloride) .... Take 1 tablet by mouth two times a day 14)  Digitek 0.25 Mg Tabs (Digoxin) .... Take 1 tablet by mouth once a day 15)  Amitriptyline Hcl 50 Mg Tabs (Amitriptyline hcl) .... Take 1 tablet by mouth at bedtime 16)  Fish Oil 1000 Mg Caps (Omega-3 fatty acids) .... Take 1 tablet by mouth once a day 17)  Flexeril 5 Mg Tabs (Cyclobenzaprine hcl) .... Take 1 tab by mouth at bedtime for 5 days 18)  Relion Blood Glucose Test Strp (Glucose blood) .... To test blood sugar twice daily 19)  Lancets Misc (Lancets) .... To test blood sugar twice daily 20)  Lisinopril 10 Mg Tabs (Lisinopril) .... Take 1 tablet by mouth once a day 21)  Robitussin Dm 100-10 Mg/78ml Syrp (Dextromethorphan-guaifenesin) .Marland Kitchen.. 1 tsp every 4 hours as need for cough  Other Orders: Capillary Blood Glucose (16109) Fingerstick (60454)   Patient Instructions: 1)  If symptoms don't improve over next 3-4 days return to clinic 2)  Take over the counter pseudoephedrine + robitussin   Prescriptions: ROBITUSSIN DM 100-10 MG/5ML  SYRP (DEXTROMETHORPHAN-GUAIFENESIN) 1 tsp every 4 hours as need for cough  #1 month x 2    Entered and Authorized by:   Tacey Ruiz MD   Signed by:   Tacey Ruiz MD on 08/09/2007   Method used:   Electronically sent to ...       [facility]*       [address]       [place], [place]  [address]       Ph: [phone]       Fax: [phone]   RxID:   [id number]  ]

## 2010-04-08 NOTE — Medication Information (Signed)
Summary: rov/tm  Anticoagulant Therapy  Managed by: Cloyde Reams, RN, BSN Referring MD: Rollene Rotunda MD PCP: Joaquin Courts  MD Supervising MD: Ladona Ridgel MD, Sharlot Gowda Indication 1: Atrial Fibrillation (ICD-427.31) Lab Used: [facility] Site: [facility] INR POC 2.8 INR RANGE 2-3  Dietary changes: no    Health status changes: no    Bleeding/hemorrhagic complications: no    Recent/future hospitalizations: no    Any changes in medication regimen? yes       Details: Incr Pravachol from 20mg  to 80mg .  Flexeril prn.    Recent/future dental: no  Any missed doses?: no       Is patient compliant with meds? yes       Allergies: 1)  ! Gemfibrozil (Gemfibrozil) 2)  ! Penicillin 3)  ! Codeine 4)  ! * Latex  Anticoagulation Management History:      The patient is taking warfarin and comes in today for a routine follow up visit.  Positive risk factors for bleeding include history of CVA/TIA and presence of serious comorbidities.  Negative risk factors for bleeding include an age less than 31 years old.  The bleeding index is 'intermediate risk'.  Positive CHADS2 values include History of HTN, History of Diabetes, and Prior Stroke/CVA/TIA.  Negative CHADS2 values include Age > 65 years old.  The start date was 07/22/2004.  Her last INR was 1.6.  Anticoagulation responsible provider: Ladona Ridgel MD, Sharlot Gowda.  INR POC: 2.8.  Exp: 06/2010.    Anticoagulation Management Assessment/Plan:      The patient's current anticoagulation dose is Warfarin sodium 5 mg tabs: Take as directed..  The target INR is 2 - 3.  The next INR is due 06/10/2009.  Anticoagulation instructions were given to patient.  Results were reviewed/authorized by Cloyde Reams, RN, BSN.  She was notified by Cloyde Reams RN.         Prior Anticoagulation Instructions: INR 3.4 Skip today's dose then resume 5mg s everyday except 2.5mg s on Saturdays. Recheck in 3 weeks.   Current Anticoagulation Instructions: INR 2.8  Continue on same  dosage 1 tablet daily except 1/2 tablet on Saturdays.  Recheck in 4 weeks.

## 2010-04-08 NOTE — Miscellaneous (Signed)
  Clinical Lists Changes  Observations: Added new observation of NUCLEAR NOS: Exercise Capacity: Lexiscan protocol BP Response: Normal blood pressure response. Clinical Symptoms: Shortness of breath. ECG Impression: No significant ST segment change suggestive of ischemia. Overall Impression: Normal stress nuclear study.  (06/03/2009 10:51) Added new observation of ETTFINDING: Cardiovascular Risk Assessment/Plan:       The patient's hypertensive risk group is category C: Target organ damage and/or diabetes.  Today's blood pressure is 116/60.  Her blood pressure goal is < 130/80.  Exercise Tolerance Test Assessment:    Quality of ETT:   diagnostic    ETT Interpretation:   normal-no evidence of ischemia by ST analysis    Comments:     The test was brief as the patient was in atrial fib and had an elevated heart rate off of her atenolol.  He had dyspnea early in stage one.  She had leg cramping.  She had ST depression of 1/2 mm even at this level of exercise.  Her exercise tolerance was very poor.    Recommendations:   I will refer her for pharmacologic stress testing.   (05/28/2009 10:52) Added new observation of HOLTERFIND: Atrial Fib, rate controlled NO pause No diary entry (04/22/2009 10:51)      Holter Monitor  Procedure date:  04/22/2009  Findings:      Atrial Fib, rate controlled NO pause No diary entry  Nuclear Study  Procedure date:  06/03/2009  Findings:      Exercise Capacity: Lexiscan protocol BP Response: Normal blood pressure response. Clinical Symptoms: Shortness of breath. ECG Impression: No significant ST segment change suggestive of ischemia. Overall Impression: Normal stress nuclear study.   Exercise Stress Test  Procedure date:  05/28/2009  Findings:      Cardiovascular Risk Assessment/Plan:       The patient's hypertensive risk group is category C: Target organ damage and/or diabetes.  Today's blood pressure is 116/60.  Her blood pressure goal is  < 130/80.  Exercise Tolerance Test Assessment:    Quality of ETT:   diagnostic    ETT Interpretation:   normal-no evidence of ischemia by ST analysis    Comments:     The test was brief as the patient was in atrial fib and had an elevated heart rate off of her atenolol.  He had dyspnea early in stage one.  She had leg cramping.  She had ST depression of 1/2 mm even at this level of exercise.  Her exercise tolerance was very poor.    Recommendations:   I will refer her for pharmacologic stress testing.

## 2010-04-08 NOTE — Progress Notes (Signed)
Summary: Refill/gh  Phone Note Refill Request Message from:  Fax from Pharmacy on November 28, 2009 4:33 PM  Refills Requested: Medication #1:  GABAPENTIN 100 MG CAPS Take 1 tab by mouth at bedtime   Last Refilled: 11/07/2009  Method Requested: Electronic Initial call taken by: Angelina Ok RN,  November 28, 2009 4:34 PM  Follow-up for Phone Call        Refill approved-nurse to complete Follow-up by: Melida Quitter MD,  November 29, 2009 9:37 AM    Prescriptions: GABAPENTIN 100 MG CAPS (GABAPENTIN) Take 1 tab by mouth at bedtime  #31 x 3   Entered and Authorized by:   Melida Quitter MD   Signed by:   Melida Quitter MD on 11/29/2009   Method used:   Electronically to        [facility]* (retail)       [address]       [place], [place]  [address]       Ph: [phone]       Fax: [phone]   RxID:   [id number]

## 2010-04-08 NOTE — Assessment & Plan Note (Signed)
Summary: (acute-[facility])knots on both feet/ch   Vital Signs:  Patient profile:   60 year old female Height:      63 inches Weight:      200.5 pounds BMI:     35.65 Temp:     97.9 degrees F oral Pulse rate:   77 / minute BP sitting:   111 / 62  (right arm)  Vitals Entered By: Filomena Jungling NT II (December 04, 2008 11:19 AM) CC: 9SHOULDER AND FEET PAIN KNOTS ON FEET Is Patient Diabetic? Yes  Pain Assessment Patient in pain? yes     Location: feet, and shoulder Intensity: 9 Type: aching Nutritional Status BMI of > 30 = obese CBG Result 172  Does patient need assistance? Functional Status Self care Ambulation Normal   Diabetic Foot Exam Foot Inspection Is there a history of a foot ulcer?              No Is there a foot ulcer now?              No Can the patient see the bottom of their feet?          Yes Are the shoes appropriate in style and fit?          Yes Is there swelling or an abnormal foot shape?          No Are the toenails long?                No Are the toenails thick?                No Are the toenails ingrown?              No Is there heavy callous build-up?              No Is there pain in the calf muscle (Intermittent claudication) when walking?    NoIs there a claw toe deformity?              No Is there elevated skin temperature?            No Is there limited ankle dorsiflexion?            No Is there foot or ankle muscle weakness?            No  Diabetic Foot Care Education Comments: KNOT ON RIGHT FOOT, LEFT ON LEFT FOOT   10-g (5.07) Semmes-Weinstein Monofilament Test Performed by: Filomena Jungling NT II          Right Foot          Left Foot Site 1         normal         normal Site 2         normal         normal Site 3         normal         normal Site 4         normal         normal Site 5         normal         normal Site 6         normal         normal Site 7         normal         normal Site 8         normal  normal Site 9          normal         normal   Primary Care Provider:  Joaquin Courts  MD  CC:  Rojelio Brenner AND FEET PAIN KNOTS ON FEET.  History of Present Illness: Pt is a 60 yo female with PMH of HTN, DM and HLD came here for both foot and right shoulder pain. She has these symptoms about several years, worse in the morning when she gets up. In the past 2 weeks, she found the foot pain worse with mass, no fever or other symptoms. She has been on darvocet and it helps the pain. She has good appetite, no diarrhea or dysuria.   Preventive Screening-Counseling & Management  Alcohol-Tobacco     Alcohol drinks/day: 0     Smoking Status: never  Caffeine-Diet-Exercise     Does Patient Exercise: yes     Type of exercise: WALKING/[employer]  Problems Prior to Update: 1)  Obesity, Unspecified  (ICD-278.00) 2)  Carotid Stenosis  (ICD-433.10) 3)  Fibromyalgia, Severe  (ICD-729.1) 4)  Rectal Bleeding  (ICD-569.3) 5)  Internal Hemorrhoids  (ICD-455.0) 6)  Gerd  (ICD-530.81) 7)  Gastritis, Chronic  (ICD-535.10) 8)  Transaminases, Serum, Elevated  (ICD-790.4) 9)  Fatigue  (ICD-780.79) 10)  Osteopenia  (ICD-733.90) 11)  Preventive Health Care  (ICD-V70.0) 12)  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  (ICD-331.83) 13)  Hyperlipidemia  (ICD-272.4) 14)  Tia  (ICD-435.9) 15)  Fatty Liver Disease  (ICD-571.8) 16)  Health Maintenance Exam  (ICD-V70.0) 17)  Hypertriglyceridemia  (ICD-272.1) 18)  Back Pain, Lumbar  (ICD-724.2) 19)  Breast Mass, Benign  (ICD-611.72) 20)  Diabetic Peripheral Neuropathy  (ICD-250.60) 21)  Hysterectomy, Hx of  (ICD-V45.77) 22)  Cholecystectomy, Hx of  (ICD-V45.79) 23)  Cholecystitis, Hx of  (ICD-V12.70) 24)  Diverticulosis, Colon  (ICD-562.10) 25)  Diabetes Mellitus, Type II  (ICD-250.00) 26)  Hypothyroidism  (ICD-244.9) 27)  Esophageal Stricture  (ICD-530.3) 28)  Atrial Fibrillation  (ICD-427.31) 29)  Osteoarthritis  (ICD-715.90) 30)  Hypertension  (ICD-401.9)  Medications Prior  to Update: 1)  Metformin Hcl 1000 Mg Tabs (Metformin Hcl) .... Take 1 Tablet By Mouth Two Times A Day 2)  Glipizide 10 Mg  Tabs (Glipizide) .... One  Tab Two Times A Day 3)  Diltiazem Hcl Cr 180 Mg Cp24 (Diltiazem Hcl) .... Take 1 Tablet By Mouth Once A Day 4)  Darvocet-N 100 100-650 Mg Tabs (Propoxyphene N-Apap) .... Take 1 Tablet By Mouth Every 4 Hours As Needed For Pain 5)  Warfarin Sodium 5 Mg Tabs (Warfarin Sodium) .... Take As Directed. 6)  Lexapro 10 Mg Tabs (Escitalopram Oxalate) .... Take 1 Tablet By Mouth Once A Day 7)  Atenolol 25 Mg Tabs (Atenolol) .... Take 1 Tablet By Mouth Two Times A Day 8)  Omeprazole 20 Mg Cpdr (Omeprazole) .... Take 1 Tablet By Mouth Once A Day 9)  Calcium 600/vitamin D 600-400 Mg-Unit Tabs (Calcium Carbonate-Vitamin D) .... Take 1 Tablet By Mouth Two Times A Day 10)  Multivitamins Tabs (Multiple Vitamin) .... Take 1 Tablet By Mouth Once A Day 11)  Levothyroxine Sodium 75 Mcg Tabs (Levothyroxine Sodium) .... Take 1 Tablet By Mouth Once A Day 12)  Lasix 40 Mg Tabs (Furosemide) .... Take 1 Tablet By Mouth Once A Day 13)  Klor-Con 10 10 Meq Tbcr (Potassium Chloride) .... Take 1 Tablet By Mouth Two Times A Day 14)  Digitek 0.25 Mg Tabs (Digoxin) .... Take 1 Tablet By Mouth Once  A Day 15)  Amitriptyline Hcl 50 Mg  Tabs (Amitriptyline Hcl) .... Take 1 Tablet By Mouth At Bedtime 16)  Fish Oil 1000 Mg Caps (Omega-3 Fatty Acids) .... Take 1 Tablet By Mouth Twice A Day 17)  Relion Blood Glucose Test   Strp (Glucose Blood) .... To Test Blood Sugar Twice Daily 18)  Lancets   Misc (Lancets) .... To Test Blood Sugar Twice Daily 19)  Lisinopril 10 Mg  Tabs (Lisinopril) .... Take 1 Tablet By Mouth Once A Day 20)  Pravachol 20 Mg Tabs (Pravastatin Sodium) .... Take 1/2 Tab By Mouth Before Bedtime and Increase To A Full Tab At Bedtime After One Week. 21)  Anusol-Hc 25 Mg  Supp (Hydrocortisone Acetate) .... Insert One Supp Into Rectum Once A Day  Current Medications  (verified): 1)  Metformin Hcl 1000 Mg Tabs (Metformin Hcl) .... Take 1 Tablet By Mouth Two Times A Day 2)  Glipizide 10 Mg  Tabs (Glipizide) .... One  Tab Two Times A Day 3)  Diltiazem Hcl Cr 180 Mg Cp24 (Diltiazem Hcl) .... Take 1 Tablet By Mouth Once A Day 4)  Darvocet-N 100 100-650 Mg Tabs (Propoxyphene N-Apap) .... Take 1 Tablet By Mouth Every 4 Hours As Needed For Pain 5)  Warfarin Sodium 5 Mg Tabs (Warfarin Sodium) .... Take As Directed. 6)  Lexapro 10 Mg Tabs (Escitalopram Oxalate) .... Take 1 Tablet By Mouth Once A Day 7)  Atenolol 25 Mg Tabs (Atenolol) .... Take 1 Tablet By Mouth Two Times A Day 8)  Omeprazole 20 Mg Cpdr (Omeprazole) .... Take 1 Tablet By Mouth Once A Day 9)  Calcium 600/vitamin D 600-400 Mg-Unit Tabs (Calcium Carbonate-Vitamin D) .... Take 1 Tablet By Mouth Two Times A Day 10)  Multivitamins Tabs (Multiple Vitamin) .... Take 1 Tablet By Mouth Once A Day 11)  Levothyroxine Sodium 75 Mcg Tabs (Levothyroxine Sodium) .... Take 1 Tablet By Mouth Once A Day 12)  Lasix 40 Mg Tabs (Furosemide) .... Take 1 Tablet By Mouth Once A Day 13)  Klor-Con 10 10 Meq Tbcr (Potassium Chloride) .... Take 1 Tablet By Mouth Two Times A Day 14)  Digitek 0.25 Mg Tabs (Digoxin) .... Take 1 Tablet By Mouth Once A Day 15)  Amitriptyline Hcl 50 Mg  Tabs (Amitriptyline Hcl) .... Take 1 Tablet By Mouth At Bedtime 16)  Fish Oil 1000 Mg Caps (Omega-3 Fatty Acids) .... Take 1 Tablet By Mouth Twice A Day 17)  Relion Blood Glucose Test   Strp (Glucose Blood) .... To Test Blood Sugar Twice Daily 18)  Lancets   Misc (Lancets) .... To Test Blood Sugar Twice Daily 19)  Lisinopril 10 Mg  Tabs (Lisinopril) .... Take 1 Tablet By Mouth Once A Day 20)  Pravachol 20 Mg Tabs (Pravastatin Sodium) .... Take 1/2 Tab By Mouth Before Bedtime and Increase To A Full Tab At Bedtime After One Week. 21)  Anusol-Hc 25 Mg  Supp (Hydrocortisone Acetate) .... Insert One Supp Into Rectum Once A Day 22)  Tylenol Extra Strength 500  Mg Tabs (Acetaminophen) .... Take 1 Tablet By Mouth Every 6 Hours As Needed For Pain  Allergies (verified): 1)  ! Gemfibrozil (Gemfibrozil) 2)  ! Penicillin 3)  ! Codeine 4)  ! * Latex  Past History:  Past Medical History: Last updated: 11/02/2008 Atrial fibrillation Diabetes mellitus, type II Diverticulosis, colon Hypertension Hypothyroidism Osteoarthritis Esophageal stricture, hx of Hiatal hernia, hx of, s/p Cholecystitis, hx of, s/p cholecystectomy Obstructive sleep apnea Sarcoidosis Diabetic neuropathy Atypical chest  pain, hx of Benign right breast mass, hx of (papilloma) Diabetic Neuropathy Fibromyalgia  Past Surgical History: Last updated: 11/02/2008 Hiatal hernia repair Cholecystectomy Esophageal dilation x 2 Hysterectomy Excision of milk duct of right breast -> benign (1/08) Hernia Surgery(femoral)  Family History: Last updated: 11/02/2008 Maternal uncle w/ dementia.   Melenoma: Father Family History of Colitis/Crohn's:Mother Family History of Diabetes: Father & Sister Family History of Heart Disease: Father  Social History: Last updated: 11/02/2008 Never Smoked Alcohol use-no Drug use-no Divorced On disability  Risk Factors: Alcohol Use: 0 (12/04/2008) Exercise: yes (12/04/2008)  Risk Factors: Smoking Status: never (12/04/2008)  Family History: Reviewed history from 11/02/2008 and no changes required. Maternal uncle w/ dementia.   Melenoma: Father Family History of Colitis/Crohn's:Mother Family History of Diabetes: Father & Sister Family History of Heart Disease: Father  Social History: Reviewed history from 11/02/2008 and no changes required. Never Smoked Alcohol use-no Drug use-no Divorced On disability  Review of Systems  The patient denies anorexia, fever, weight loss, vision loss, decreased hearing, hoarseness, chest pain, syncope, dyspnea on exertion, peripheral edema, prolonged cough, headaches, abdominal pain, melena, and  hematochezia.    Physical Exam  General:  alert, well-developed, well-nourished, well-hydrated, and overweight-appearing.   Head:  normocephalic.   Eyes:  vision grossly intact.   Ears:  no external deformities.   Nose:  no external erythema.   Mouth:  pharynx pink and moist.   Neck:  supple.   Lungs:  normal respiratory effort, normal breath sounds, no crackles, and no wheezes.   Heart:  normal rate, regular rhythm, no murmur, and no JVD.   Abdomen:  soft, non-tender, normal bowel sounds, no distention, and no masses.   Msk:  normal ROM, no joint swelling, no joint warmth, and no redness over joints.  Right shoulder mild tenderness, no redness or limitation of movement. There is a 0.5 cm nodule on MCP joint of both foot, mild tenderness, no redness.   Pulses:  2+ Extremities:  No edema Neurologic:  alert & oriented X3, cranial nerves II-XII intact, strength normal in all extremities, sensation intact to light touch, gait normal, and DTRs symmetrical and normal.     Impression & Recommendations:  Problem # 1:  OSTEOARTHRITIS (ICD-715.90) Assessment Deteriorated Her symptom is likely due to OA, gout is less likely. Will give tylenol besides darvocet, also recommends heating pad and exercise. If no improvement, may have X-Ray at next visit.  Her updated medication list for this problem includes:    Darvocet-n 100 100-650 Mg Tabs (Propoxyphene n-apap) .Marland Kitchen... Take 1 tablet by mouth every 4 hours as needed for pain    Tylenol Extra Strength 500 Mg Tabs (Acetaminophen) .Marland Kitchen... Take 1 tablet by mouth every 6 hours as needed for pain  Discussed use of medications, application of heat or cold, and exercises.   Problem # 2:  DIABETES MELLITUS, TYPE II (ICD-250.00) Assessment: Unchanged At goal, will continue current regimen.  Her updated medication list for this problem includes:    Metformin Hcl 1000 Mg Tabs (Metformin hcl) .Marland Kitchen... Take 1 tablet by mouth two times a day    Glipizide 10 Mg Tabs  (Glipizide) ..... One  tab two times a day    Lisinopril 10 Mg Tabs (Lisinopril) .Marland Kitchen... Take 1 tablet by mouth once a day  Orders: T-Hgb A1C (in-house) ([address]109UE) T- Capillary Blood Glucose (45409)  Labs Reviewed: Creat: 0.74 (08/24/2008)    Reviewed HgBA1c results: 6.1 (12/04/2008)  5.5 (07/02/2008)  Problem # 3:  HYPERTENSION (ICD-401.9)  Assessment: Comment Only at goal, no change to her medications.  Her updated medication list for this problem includes:    Diltiazem Hcl Cr 180 Mg Cp24 (Diltiazem hcl) .Marland Kitchen... Take 1 tablet by mouth once a day    Atenolol 25 Mg Tabs (Atenolol) .Marland Kitchen... Take 1 tablet by mouth two times a day    Lasix 40 Mg Tabs (Furosemide) .Marland Kitchen... Take 1 tablet by mouth once a day    Lisinopril 10 Mg Tabs (Lisinopril) .Marland Kitchen... Take 1 tablet by mouth once a day  BP today: 111/62 Prior BP: 121/73 (11/06/2008)  Labs Reviewed: K+: 4.6 (08/24/2008) Creat: : 0.74 (08/24/2008)   Chol: 163 (08/24/2008)   HDL: 35 (08/24/2008)   LDL: 63 (08/24/2008)   TG: 327 (08/24/2008)  Complete Medication List: 1)  Metformin Hcl 1000 Mg Tabs (Metformin hcl) .... Take 1 tablet by mouth two times a day 2)  Glipizide 10 Mg Tabs (Glipizide) .... One  tab two times a day 3)  Diltiazem Hcl Cr 180 Mg Cp24 (Diltiazem hcl) .... Take 1 tablet by mouth once a day 4)  Darvocet-n 100 100-650 Mg Tabs (Propoxyphene n-apap) .... Take 1 tablet by mouth every 4 hours as needed for pain 5)  Warfarin Sodium 5 Mg Tabs (Warfarin sodium) .... Take as directed. 6)  Lexapro 10 Mg Tabs (Escitalopram oxalate) .... Take 1 tablet by mouth once a day 7)  Atenolol 25 Mg Tabs (Atenolol) .... Take 1 tablet by mouth two times a day 8)  Omeprazole 20 Mg Cpdr (Omeprazole) .... Take 1 tablet by mouth once a day 9)  Calcium 600/vitamin D 600-400 Mg-unit Tabs (Calcium carbonate-vitamin d) .... Take 1 tablet by mouth two times a day 10)  Multivitamins Tabs (Multiple vitamin) .... Take 1 tablet by mouth once a day 11)  Levothyroxine  Sodium 75 Mcg Tabs (Levothyroxine sodium) .... Take 1 tablet by mouth once a day 12)  Lasix 40 Mg Tabs (Furosemide) .... Take 1 tablet by mouth once a day 13)  Klor-con 10 10 Meq Tbcr (Potassium chloride) .... Take 1 tablet by mouth two times a day 14)  Digitek 0.25 Mg Tabs (Digoxin) .... Take 1 tablet by mouth once a day 15)  Amitriptyline Hcl 50 Mg Tabs (Amitriptyline hcl) .... Take 1 tablet by mouth at bedtime 16)  Fish Oil 1000 Mg Caps (Omega-3 fatty acids) .... Take 1 tablet by mouth twice a day 17)  Relion Blood Glucose Test Strp (Glucose blood) .... To test blood sugar twice daily 18)  Lancets Misc (Lancets) .... To test blood sugar twice daily 19)  Lisinopril 10 Mg Tabs (Lisinopril) .... Take 1 tablet by mouth once a day 20)  Pravachol 20 Mg Tabs (Pravastatin sodium) .... Take 1/2 tab by mouth before bedtime and increase to a full tab at bedtime after one week. 21)  Anusol-hc 25 Mg Supp (Hydrocortisone acetate) .... Insert one supp into rectum once a day 22)  Tylenol Extra Strength 500 Mg Tabs (Acetaminophen) .... Take 1 tablet by mouth every 6 hours as needed for pain  Other Orders: Influenza Vaccine MCR ([address])  Patient Instructions: 1)  Please schedule a follow-up appointment in 3-4 months with PCP.  2)  Pleas econtinue to exercise and take pain medications as needed.  Prescriptions: TYLENOL EXTRA STRENGTH 500 MG TABS (ACETAMINOPHEN) Take 1 tablet by mouth every 6 hours as needed for pain  #60 x 3   Entered and Authorized by:   Jackson Latino MD   Signed by:  Jackson Latino MD on 12/04/2008   Method used:   Print then Give to Patient   RxID:   [id number]   Prevention & Chronic Care Immunizations   Influenza vaccine: Fluvax MCR  (12/04/2008)   Influenza vaccine deferral: Not available  (08/24/2008)   Influenza vaccine due: 11/07/2008    Tetanus booster: Not documented   Td booster deferral: Deferred  (08/24/2008)    Pneumococcal vaccine: Not documented    Pneumococcal vaccine deferral: Deferred  (08/24/2008)    H. zoster vaccine: Not documented   H. zoster vaccine deferral: Deferred  (08/24/2008)  Colorectal Screening   Hemoccult: Not documented   Hemoccult action/deferral: Ordered  (08/24/2008)    Colonoscopy: Location:  [facility].    (10/22/2008)   Colonoscopy action/deferral: Repeat colonoscopy in 7 years. 2012  (08/22/2003)   Colonoscopy due: 05/2010  Other Screening   Pap smear: Not documented   Pap smear action/deferral: Not indicated S/P hysterectomy  (08/24/2008)    Mammogram: ASSESSMENT: Negative - BI-RADS 1^MM DIGITAL SCREENING  (11/19/2008)   Mammogram due: 10/2008    DXA bone density scan:  Lumbar Spine:  T Score > -1.0 Spine.  Hip Total: T Score -2.5 to -1.0 Hip.      (03/20/2008)   DXA scan due: 03/2010    Smoking status: never  (12/04/2008)  Diabetes Mellitus   HgbA1C: 6.1  (12/04/2008)   HgbA1C action/deferral: Ordered  (12/04/2008)    Eye exam: Not documented    Foot exam: yes  (03/13/2008)   Foot exam action/deferral: Do today   High risk foot: Not documented   Foot care education: Not documented   Foot exam due: 03/13/2009    Urine microalbumin/creatinine ratio: 23.8  (12/15/2007)    Diabetes flowsheet reviewed?: Yes   Progress toward A1C goal: At goal  Lipids   Total Cholesterol: 163  (08/24/2008)   Lipid panel action/deferral: Lipid Panel ordered   LDL: 63  (08/24/2008)   LDL Direct: Not documented   HDL: 35  (08/24/2008)   Triglycerides: 327  (08/24/2008)    SGOT (AST): 51  (08/30/2008)   SGPT (ALT): 60  (08/30/2008)   Alkaline phosphatase: 75  (08/30/2008)   Total bilirubin: 0.7  (08/30/2008)    Lipid flowsheet reviewed?: Yes   Progress toward LDL goal: At goal  Hypertension   Last Blood Pressure: 111 / 62  (12/04/2008)   Serum creatinine: 0.74  (08/24/2008)   Serum potassium 4.6  (08/24/2008)    Hypertension flowsheet reviewed?: Yes   Progress toward BP goal:  At goal  Self-Management Support :   Personal Goals (by the next clinic visit) :     Personal A1C goal: 7  (12/04/2008)     Personal blood pressure goal: 130/80  (12/04/2008)     Personal LDL goal: 100  (12/04/2008)    Diabetes self-management support: Not documented   Last diabetes self-management training by diabetes educator: 07/25/2007   Last medical nutrition therapy: 03/08/2007    Hypertension self-management support: Not documented    Lipid self-management support: Not documented    Nursing Instructions: HgbA1C today (see order) CBG today (see order) Diabetic foot exam today      Laboratory Results   Blood Tests   Date/Time Received: December 04, 2008 11:42 AM  Date/Time Reported: Oren Beckmann  December 04, 2008 11:42 AM   HGBA1C: 6.1%   (Normal Range: Non-Diabetic - 3-6%   Control Diabetic - 6-8%) CBG Random:: 172mg /dL      Influenza Vaccine  Vaccine Type: Fluvax MCR    Site: left deltoid    Mfr: [employer]is    Dose: 0.5 ml    Route: IM    Given by: Chinita Pester RN    Exp. Date: 07/06/2009    Lot #: [id number]    VIS given: 09/30/06 version given December 04, 2008.  Flu Vaccine Consent Questions    Do you have a history of severe allergic reactions to this vaccine? no    Any prior history of allergic reactions to egg and/or gelatin? no    Do you have a sensitivity to the preservative Thimersol? no    Do you have a past history of Guillan-Barre Syndrome? no    Do you currently have an acute febrile illness? no    Have you ever had a severe reaction to latex? yes    Vaccine information given and explained to patient? yes    Are you currently pregnant? no

## 2010-04-08 NOTE — Miscellaneous (Signed)
Summary: Orders Update  Clinical Lists Changes  Orders: Added new Test order of Ultrasound (Ultrasound) - Signed 

## 2010-04-08 NOTE — Procedures (Signed)
Summary: EGD   EGD  Procedure date:  05/07/2005  Findings:      Location: [facility]    Patient Name: [name], [name] MRN: [id number] Procedure Procedures: Panendoscopy (EGD) CPT: 43235.  Personnel: Endoscopist: Ulyess Mort, MD.  Exam Location: Exam performed in Outpatient Clinic. Outpatient  Patient Consent: Procedure, Alternatives, Risks and Benefits discussed, consent obtained, from patient. Consent was obtained by the RN.  Indications Symptoms: Dysphagia. Reflux symptoms  History  Current Medications: Patient is not currently taking Coumadin.  Pre-Exam Physical: Entire physical exam was normal.  Comments: Pt. history reviewed/updated, physical exam performed prior to initiation of sedation? Exam Exam Info: Maximum depth of insertion Duodenum, intended Duodenum. Patient position: on left side. Vocal cords visualized. Gastric retroflexion performed. Images taken. ASA Classification: II. Tolerance: good.  Sedation Meds: Patient assessed and found to be appropriate for moderate (conscious) sedation. Fentanyl given IV. Versed given IV. Cetacaine Spray given aerosolized.  Monitoring: BP and pulse monitoring done. Oximetry used. Supplemental O2 given  Findings - HIATAL HERNIA: 3 cms. in length. mild spasm. ICD9: Hernia, Hiatal: 553.3. - Dilation: Duodenal Bulb. Maloney dilator used, Diameter: 56 mm, Minimal Resistance, No Heme present on extraction. Patient tolerance good. Comments: held 60 secs.  - MUCOSAL ABNORMALITY: Fundus to Antrum. Erythematous mucosa. Granular mucosa. ICD9: Gastritis, Chronic: 535.10. Comment: mild diffuse gastritis.  - MUCOSAL ABNORMALITY: Duodenal Bulb to Jejunum. Granular mucosa.   Assessment Abnormal examination, see findings above.  Diagnoses: 553.3: Hernia, Hiatal.  535.10: Gastritis, Chronic.   Events  Unplanned Intervention: No unplanned interventions were required.  Unplanned Events: There were no  complications. Plans Medication(s): Continue current medications. PPI: Lansoprazole/Prevacid 30 mg BID,   Patient Education: Patient given standard instructions for: Hiatal Hernia. Reflux. Mucosal Abnormality. ?? increased spasm---esophagous.  Disposition: After procedure patient sent to recovery. After recovery patient sent home.    This report was created from the original endoscopy report, which was reviewed and signed by the above listed endoscopist.

## 2010-04-08 NOTE — Progress Notes (Signed)
Summary: Refill/gh  Phone Note Refill Request Message from:  Fax from Pharmacy on Jul 09, 2008 12:14 PM  Refills Requested: Medication #1:  LEVOTHYROXINE SODIUM 75 MCG TABS Take 1 tablet by mouth once a day   Last Refilled: 06/11/2008  Method Requested: Electronic Initial call taken by: Angelina Ok RN,  Jul 09, 2008 12:14 PM  Follow-up for Phone Call        I will refill for one month but please call and remind her to try to come in during the next week or two to get her labs done.  Thank you. Follow-up by: Joaquin Courts  MD,  Jul 09, 2008 8:08 PM      Prescriptions: LEVOTHYROXINE SODIUM 75 MCG TABS (LEVOTHYROXINE SODIUM) Take 1 tablet by mouth once a day  #30 x 0   Entered and Authorized by:   Joaquin Courts  MD   Signed by:   Joaquin Courts  MD on 07/09/2008   Method used:   Electronically to        [facility]* (retail)       [address]       [place], [place]  [address]       Ph: [phone]       Fax: [phone]   RxID:   [id number]

## 2010-04-08 NOTE — Progress Notes (Signed)
  Phone Note Outgoing Call   Call placed by: Peggye Pitt, MD Call placed to: Patient Summary of Call: Called [name] to ask how she was feeling after we had seen her yesterday for jaw pain and edema. CT scan was negative. She is feeling much better, has been able to eat, edema is decreasing as well as the pain. Initial call taken by: Peggye Pitt MD,  August 11, 2006 9:28 AM

## 2010-04-08 NOTE — Progress Notes (Signed)
Summary: refill/ hla  Phone Note Refill Request Message from:  Fax from Pharmacy on July 02, 2006 1:46 PM  Refills Requested: Medication #1:  KLOR-CON 10 10 MEQ TBCR Take 1 tablet by mouth two times a day   Last Refilled: 05/31/2006  Medication #2:  OMEPRAZOLE 20 MG CPDR Take 1 tablet by mouth once a day   Last Refilled: 05/31/2006  Medication #3:  LASIX 40 MG TABS Take 1 tablet by mouth once a day   Last Refilled: 05/31/2006 Initial call taken by: Marin Roberts RN,  July 02, 2006 1:47 PM  Follow-up for Phone Call        Refill approved-nurse to complete Follow-up by: Eliseo Gum MD,  July 02, 2006 3:10 PM  Additional Follow-up for Phone Call Additional follow up Details #1::        Rx faxed to pharmacy Additional Follow-up by: Marin Roberts RN,  July 02, 2006 4:22 PM    Prescriptions: KLOR-CON 10 10 MEQ TBCR (POTASSIUM CHLORIDE) Take 1 tablet by mouth two times a day  #62 x 5   Entered and Authorized by:   Eliseo Gum MD   Signed by:   Eliseo Gum MD on 07/02/2006   Method used:   Telephoned to ...       [facility]       [address]       [place], [place]  [address]       Ph: [phone]       Fax: [phone]   RxID:   [id number] LASIX 40 MG TABS (FUROSEMIDE) Take 1 tablet by mouth once a day  #31 x 5   Entered and Authorized by:   Eliseo Gum MD   Signed by:   Eliseo Gum MD on 07/02/2006   Method used:   Telephoned to ...       [facility]       [address]       [place], [place]  [address]       Ph: [phone]       Fax: [phone]   RxID:   [id number] OMEPRAZOLE 20 MG CPDR (OMEPRAZOLE) Take 1 tablet by mouth once a day  #31 x 5   Entered and Authorized by:   Eliseo Gum MD   Signed by:   Eliseo Gum MD on 07/02/2006   Method used:   Telephoned to ...       [facility]       [address]  [place], [place]  [address]       Ph: [phone]       Fax: [phone]   RxID:   [id number]

## 2010-04-08 NOTE — Assessment & Plan Note (Signed)
Summary: FU/EST/VS   Vital Signs:  Patient Profile:   60 Years Old Female Height:     63 inches (160.02 cm) Weight:      206.9 pounds (94.05 kg) BMI:     36.78 Temp:     98.1 degrees F (36.72 degrees C) oral Pulse rate:   88 / minute BP sitting:   127 / 77  (left arm)  Pt. in pain?   yes    Location:   lower right abd    Intensity:   8  Vitals Entered By: Krystal Eaton Duncan Dull) (June 30, 2007 2:56 PM)              Is Patient Diabetic? Yes  Nutritional Status BMI of > 30 = obese CBG Device ID [id number]  Does patient need assistance? Functional Status Self care Ambulation Normal     PCP:  Peggye Pitt, MD  Chief Complaint:  one mth f/u ultrasound and lab and vascular study result- c/o "sharp pain" lower right abd.  History of Present Illness: No complaints. Here for f/u on dopplers and echo performed for a TIA episode. Doppler showed a 40-60% right ICA stenosis with no left ICA stenosis. 2D ECHO was normal. An abdominal US was also performed 2/2 high LFTs and showed NASH (fatty liver).    Prior Medication List:  METFORMIN HCL 1000 MG TABS (METFORMIN HCL) Take 1 tablet by mouth two times a day GLIPIZIDE 10 MG  TABS (GLIPIZIDE) one  tab two times a day DILTIAZEM HCL CR 180 MG CP24 (DILTIAZEM HCL) Take 1 tablet by mouth once a day DARVOCET-N 100 100-650 MG TABS (PROPOXYPHENE N-APAP) Take 1 tablet by mouth every 4 hours as needed for pain WARFARIN SODIUM 5 MG TABS (WARFARIN SODIUM) Take 1 tablet by mouth once a day LEXAPRO 10 MG TABS (ESCITALOPRAM OXALATE) Take 1 tablet by mouth once a day ATENOLOL 25 MG TABS (ATENOLOL) Take 1 tablet by mouth two times a day OMEPRAZOLE 20 MG CPDR (OMEPRAZOLE) Take 1 tablet by mouth once a day CALCARB 600/D 600-125 MG-UNIT TABS (CALCIUM-VITAMIN D) Take 1 tablet by mouth two times a day MULTIVITAMINS TABS (MULTIPLE VITAMIN) Take 1 tablet by mouth once a day LEVOTHYROXINE SODIUM 75 MCG TABS (LEVOTHYROXINE SODIUM) Take 1 tablet by mouth  once a day LASIX 40 MG TABS (FUROSEMIDE) Take 1 tablet by mouth once a day KLOR-CON 10 10 MEQ TBCR (POTASSIUM CHLORIDE) Take 1 tablet by mouth two times a day DIGITEK 0.25 MG TABS (DIGOXIN) Take 1 tablet by mouth once a day AMITRIPTYLINE HCL 50 MG  TABS (AMITRIPTYLINE HCL) Take 1 tablet by mouth at bedtime FISH OIL 1000 MG CAPS (OMEGA-3 FATTY ACIDS) Take 1 tablet by mouth once a day FLEXERIL 5 MG  TABS (CYCLOBENZAPRINE HCL) Take 1 tab by mouth at bedtime for 5 days RELION BLOOD GLUCOSE TEST   STRP (GLUCOSE BLOOD) to test blood sugar twice daily LANCETS   MISC (LANCETS) to test blood sugar twice daily LISINOPRIL 10 MG  TABS (LISINOPRIL) Take 1 tablet by mouth once a day   Current Allergies (reviewed today): ! GEMFIBROZIL (GEMFIBROZIL) ! PENICILLIN ! CODEINE ! * LATEX    Risk Factors: Tobacco use:  never Drug use:  no Alcohol use:  no   Review of Systems  The patient denies fever, weight loss, chest pain, dyspnea on exhertion, abdominal pain, melena, hematochezia, and severe indigestion/heartburn.     Physical Exam  General:     Well-developed,well-nourished,in no acute distress; alert,appropriate and  cooperative throughout examination Neck:     No bruits/goiter Lungs:     Normal respiratory effort, chest expands symmetrically. Lungs are clear to auscultation, no crackles or wheezes. Heart:     normal rate, no murmur, no gallop, no rub, and irregular rhythm.   Extremities:     No clubbing, cyanosis, edema.    Impression & Recommendations:  Problem # 1:  TIA (ICD-435.9) No further TIA episodes. WOrkup complete.  Her updated medication list for this problem includes:    Warfarin Sodium 5 Mg Tabs (Warfarin sodium) .Marland Kitchen... Take 1 tablet by mouth once a day   Problem # 2:  FATTY LIVER DISEASE (ICD-571.8) This would explain the elevated transaminases. Needs a statin; will hold until LFTs trending down. Needs better DM control.  Problem # 3:  HYPERLIPIDEMIA  (ICD-272.4) LDL not at goal for a diabetic. NASH and elevated LFTs preclude use of a statin at this point. Will recheck LFTs in 4-6 weeks and decide if initiating a statin is appropriate.  Labs Reviewed: Chol: 180 (06/16/2007)   HDL: 37 (06/16/2007)   LDL: 105 (06/16/2007)   TG: 192 (06/16/2007) SGOT: 70 (06/01/2007)   SGPT: 66 (06/01/2007)   Problem # 4:  HEALTH MAINTENANCE EXAM (ICD-V70.0)  Has had complete hysterectomy. (no more PAP smears). Will get DEXA scan as she is 25 years post-menopausal (next visit) FLP ordered. Mammograms up to date. Per patient had colonoscopy 3 years ago....will obtain result.   Problem # 5:  HYPERTRIGLYCERIDEMIA (ICD-272.1) Improved on fish oil tablets. Cannot afford a fibrate at this time.  Labs Reviewed: Chol: 180 (06/16/2007)   HDL: 37 (06/16/2007)   LDL: 105 (06/16/2007)   TG: 192 (06/16/2007) SGOT: 70 (06/01/2007)   SGPT: 66 (06/01/2007)   Problem # 6:  DIABETES MELLITUS, TYPE II (ICD-250.00) I believe she needs to be started on basal insulin to acheive better glycemic control. (this will improve her fattly liver). Will have her schedule an appointment with Jamison Neighbor for initial insulin instruction and nutrition information.  Her updated medication list for this problem includes:    Metformin Hcl 1000 Mg Tabs (Metformin hcl) .Marland Kitchen... Take 1 tablet by mouth two times a day    Glipizide 10 Mg Tabs (Glipizide) ..... One  tab two times a day    Lisinopril 10 Mg Tabs (Lisinopril) .Marland Kitchen... Take 1 tablet by mouth once a day  Orders: Capillary Blood Glucose ([address]) Fingerstick ([address])  Labs Reviewed: HgBA1c: 7.6 (06/01/2007)   Creat: 0.80 (06/16/2007)      Problem # 7:  HYPOTHYROIDISM (ICD-244.9) On synthroid. TSH WNL.  Her updated medication list for this problem includes:    Levothyroxine Sodium 75 Mcg Tabs (Levothyroxine sodium) .Marland Kitchen... Take 1 tablet by mouth once a day  Labs Reviewed: TSH: 0.876 (06/01/2007)    HgBA1c: 7.6  (06/01/2007) Chol: 180 (06/16/2007)   HDL: 37 (06/16/2007)   LDL: 105 (06/16/2007)   TG: 192 (06/16/2007)   Problem # 8:  HYPERTENSION (ICD-401.9) BP excellent. Continue current regimen.  Her updated medication list for this problem includes:    Diltiazem Hcl Cr 180 Mg Cp24 (Diltiazem hcl) .Marland Kitchen... Take 1 tablet by mouth once a day    Atenolol 25 Mg Tabs (Atenolol) .Marland Kitchen... Take 1 tablet by mouth two times a day    Lasix 40 Mg Tabs (Furosemide) .Marland Kitchen... Take 1 tablet by mouth once a day    Lisinopril 10 Mg Tabs (Lisinopril) .Marland Kitchen... Take 1 tablet by mouth once a day  BP today: 127/77 Prior  BP: 140/73 (06/01/2007)  Labs Reviewed: Creat: 0.80 (06/16/2007) Chol: 180 (06/16/2007)   HDL: 37 (06/16/2007)   LDL: 105 (06/16/2007)   TG: 192 (06/16/2007)   Problem # 9:  ATRIAL FIBRILLATION (ICD-427.31) On coumadin, followed by LB cards.  Her updated medication list for this problem includes:    Warfarin Sodium 5 Mg Tabs (Warfarin sodium) .Marland Kitchen... Take 1 tablet by mouth once a day    Atenolol 25 Mg Tabs (Atenolol) .Marland Kitchen... Take 1 tablet by mouth two times a day    Digitek 0.25 Mg Tabs (Digoxin) .Marland Kitchen... Take 1 tablet by mouth once a day   Complete Medication List: 1)  Metformin Hcl 1000 Mg Tabs (Metformin hcl) .... Take 1 tablet by mouth two times a day 2)  Glipizide 10 Mg Tabs (Glipizide) .... One  tab two times a day 3)  Diltiazem Hcl Cr 180 Mg Cp24 (Diltiazem hcl) .... Take 1 tablet by mouth once a day 4)  Darvocet-n 100 100-650 Mg Tabs (Propoxyphene n-apap) .... Take 1 tablet by mouth every 4 hours as needed for pain 5)  Warfarin Sodium 5 Mg Tabs (Warfarin sodium) .... Take 1 tablet by mouth once a day 6)  Lexapro 10 Mg Tabs (Escitalopram oxalate) .... Take 1 tablet by mouth once a day 7)  Atenolol 25 Mg Tabs (Atenolol) .... Take 1 tablet by mouth two times a day 8)  Omeprazole 20 Mg Cpdr (Omeprazole) .... Take 1 tablet by mouth once a day 9)  Calcarb 600/d 600-125 Mg-unit Tabs (Calcium-vitamin d) ....  Take 1 tablet by mouth two times a day 10)  Multivitamins Tabs (Multiple vitamin) .... Take 1 tablet by mouth once a day 11)  Levothyroxine Sodium 75 Mcg Tabs (Levothyroxine sodium) .... Take 1 tablet by mouth once a day 12)  Lasix 40 Mg Tabs (Furosemide) .... Take 1 tablet by mouth once a day 13)  Klor-con 10 10 Meq Tbcr (Potassium chloride) .... Take 1 tablet by mouth two times a day 14)  Digitek 0.25 Mg Tabs (Digoxin) .... Take 1 tablet by mouth once a day 15)  Amitriptyline Hcl 50 Mg Tabs (Amitriptyline hcl) .... Take 1 tablet by mouth at bedtime 16)  Fish Oil 1000 Mg Caps (Omega-3 fatty acids) .... Take 1 tablet by mouth once a day 17)  Flexeril 5 Mg Tabs (Cyclobenzaprine hcl) .... Take 1 tab by mouth at bedtime for 5 days 18)  Relion Blood Glucose Test Strp (Glucose blood) .... To test blood sugar twice daily 19)  Lancets Misc (Lancets) .... To test blood sugar twice daily 20)  Lisinopril 10 Mg Tabs (Lisinopril) .... Take 1 tablet by mouth once a day   Patient Instructions: 1)  Please schedule a follow-up appointment in 1 month. 2)  Please schedule an appointment with Jamison Neighbor before you see me again.    ]

## 2010-04-08 NOTE — Letter (Signed)
Summary: Handout Printed  Printed Handout:  - Coumadin Instructions 

## 2010-04-08 NOTE — Progress Notes (Signed)
Summary: med refill/gp  Phone Note Refill Request Message from:  Fax from Pharmacy on September 25, 2009 2:12 PM  Refills Requested: Medication #1:  METFORMIN HCL 1000 MG TABS Take 1 tablet by mouth two times a day   Last Refilled: 08/09/2009  Medication #2:  OMEPRAZOLE 20 MG CPDR Take 1 tablet by mouth once a day prn   Last Refilled: 08/23/2009  Medication #3:  ATENOLOL 25 MG TABS Take 1 tablet by mouth two times a day   Last Refilled: 09/04/2009  Medication #4:  LEVOTHYROXINE SODIUM 75 MCG TABS Take 1 tablet by mouth once a day   Last Refilled: 09/20/2009   MEDICATION #5  Furosemide 40mg  - Take 1 tablet each day  #31.  Last refill 09/20/09.  Last appt. May 16.   Method Requested: Electronic Initial call taken by: Chinita Pester RN,  September 25, 2009 2:15 PM  Follow-up for Phone Call        Refill approved-nurse to complete Follow-up by: Melida Quitter MD,  September 26, 2009 1:39 PM    Prescriptions: LEVOTHYROXINE SODIUM 75 MCG TABS (LEVOTHYROXINE SODIUM) Take 1 tablet by mouth once a day  #30 x 6   Entered and Authorized by:   Melida Quitter MD   Signed by:   Melida Quitter MD on 09/26/2009   Method used:   Electronically to        [facility]* (retail)       [address]       [place], [place]  [address]       Ph: [phone]       Fax: [phone]   RxID:   [id number] ATENOLOL 25 MG TABS (ATENOLOL) Take 1 tablet by mouth two times a day  #60 x 6   Entered and Authorized by:   Melida Quitter MD   Signed by:   Melida Quitter MD on 09/26/2009   Method used:   Electronically to        [facility]* (retail)       [address]       [place], [place]  [address]       Ph: [phone]       Fax: [phone]   RxID:   [id number] OMEPRAZOLE 20 MG CPDR (OMEPRAZOLE) Take 1 tablet by mouth once a day prn  #31 x 6   Entered and Authorized by:   Melida Quitter MD   Signed by:   Melida Quitter MD  on 09/26/2009   Method used:   Electronically to        [facility]* (retail)       [address]       [place], [place]  [address]       Ph: [phone]       Fax: [phone]   RxID:   [id number] METFORMIN HCL 1000 MG TABS (METFORMIN HCL) Take 1 tablet by mouth two times a day  #62 x 9   Entered and Authorized by:   Melida Quitter MD   Signed by:   Melida Quitter MD on 09/26/2009   Method used:   Electronically to        [facility]* (retail)       [address]       [place]  [place], [place]  [address]       Ph: [phone]       Fax: [phone]   RxID:   [id number] LASIX 40 MG TABS (FUROSEMIDE) Take 1 tablet by mouth once a day  #31 x 6   Entered and Authorized by:   Melida Quitter MD   Signed by:   Melida Quitter MD on 09/26/2009   Method used:   Electronically to        [facility]* (retail)       [address]       [place], [place]  [address]       Ph: [phone]       Fax: [phone]   RxID:   [id number]

## 2010-04-08 NOTE — Letter (Signed)
Summary: [facility] Medical Clearance Request   [employer] Medical Clearance Request   Imported By: Roderic Ovens 09/19/2009 15:27:13  _____________________________________________________________________  External Attachment:    Type:   Image     Comment:   External Document

## 2010-04-08 NOTE — Letter (Signed)
Summary: carotid results letter  [name], Main Office  [address]   [place], [place] [address]   Phone: [phone]  Fax: [phone]            December 26, 2009 MRN: [id number]    [name] [address] [place], [place]  [address]    Dear [name],   I have been unable to reach you by phone to review the results of your carotid doppler which was normal.  Dr. Antoine Poche would like for you to repeat this test in one year.    Please call our office with the correct contact information.        Sincerely,      Charolotte Capuchin, RN for Dr. Rollene Rotunda  This letter has been electronically signed by your physician.

## 2010-04-08 NOTE — Medication Information (Signed)
Summary: rov/tm  Anticoagulant Therapy  Managed by: Cloyde Reams, RN, BSN Referring MD: Rollene Rotunda MD PCP: Joaquin Courts  MD Supervising MD: Excell Seltzer MD, Casimiro Needle Indication 1: Atrial Fibrillation (ICD-427.31) Lab Used: [facility] Site: [facility] INR POC 2.9 INR RANGE 2-3  Dietary changes: no    Health status changes: no    Bleeding/hemorrhagic complications: no    Recent/future hospitalizations: yes       Details: Pending surgery in July L foot, bone spur.  Any changes in medication regimen? no    Recent/future dental: no  Any missed doses?: no       Is patient compliant with meds? yes       Allergies: 1)  ! Gemfibrozil (Gemfibrozil) 2)  ! Penicillin 3)  ! Codeine 4)  ! * Latex  Anticoagulation Management History:      The patient is taking warfarin and comes in today for a routine follow up visit.  Positive risk factors for bleeding include history of CVA/TIA and presence of serious comorbidities.  Negative risk factors for bleeding include an age less than 7 years old.  The bleeding index is 'intermediate risk'.  Positive CHADS2 values include History of HTN, History of Diabetes, and Prior Stroke/CVA/TIA.  Negative CHADS2 values include Age > 30 years old.  The start date was 07/22/2004.  Her last INR was 1.6.  Anticoagulation responsible provider: Excell Seltzer MD, Casimiro Needle.  INR POC: 2.9.  Cuvette Lot#: [id number].  Exp: 10/2010.    Anticoagulation Management Assessment/Plan:      The patient's current anticoagulation dose is Warfarin sodium 5 mg tabs: Take as directed..  The target INR is 2 - 3.  The next INR is due 09/05/2009.  Anticoagulation instructions were given to patient.  Results were reviewed/authorized by Cloyde Reams, RN, BSN.  She was notified by Cloyde Reams RN.         Prior Anticoagulation Instructions: INR 2.0 Continue 5mg s daily except 2.5mg s on Saturdays. Recheck in 4 weeks.   Current Anticoagulation Instructions: INR 2.9  Continue on same  dosage 1 tablet daily except 1/2 tablet on Saturdays.  Recheck in 4 weeks.

## 2010-04-08 NOTE — Medication Information (Signed)
Summary: rov.mp  Anticoagulant Therapy  Managed by: Cloyde Reams, RN, BSN Referring MD: Rollene Rotunda MD PCP: Joaquin Courts  MD Supervising MD: Daleen Squibb MD, Maisie Fus Indication 1: Atrial Fibrillation (ICD-427.31) Lab Used: [facility] Site: [facility] PT 14.4 INR POC 1.4 INR RANGE 2-3  Dietary changes: no    Health status changes: no    Bleeding/hemorrhagic complications: no    Recent/future hospitalizations: yes       Details: Colonoscopy scheduled for 10/22/08.    Any changes in medication regimen? yes       Details: Pt was on abx for teeth, completed Sunday 1 week ago.  Recent/future dental: no  Any missed doses?: yes     Details: Pt was off coumadin 5 days prior to dental extraction.  Resumed on 10/10/08.    Current Medications (verified): 1)  Metformin Hcl 1000 Mg Tabs (Metformin Hcl) .... Take 1 Tablet By Mouth Two Times A Day 2)  Glipizide 10 Mg  Tabs (Glipizide) .... One  Tab Two Times A Day 3)  Diltiazem Hcl Cr 180 Mg Cp24 (Diltiazem Hcl) .... Take 1 Tablet By Mouth Once A Day 4)  Darvocet-N 100 100-650 Mg Tabs (Propoxyphene N-Apap) .... Take 1 Tablet By Mouth Every 4 Hours As Needed For Pain 5)  Warfarin Sodium 5 Mg Tabs (Warfarin Sodium) .... Take As Directed. 6)  Lexapro 10 Mg Tabs (Escitalopram Oxalate) .... Take 1 Tablet By Mouth Once A Day 7)  Atenolol 25 Mg Tabs (Atenolol) .... Take 1 Tablet By Mouth Two Times A Day 8)  Omeprazole 20 Mg Cpdr (Omeprazole) .... Take 1 Tablet By Mouth Once A Day 9)  Calcium 600/vitamin D 600-400 Mg-Unit Tabs (Calcium Carbonate-Vitamin D) .... Take 1 Tablet By Mouth Two Times A Day 10)  Multivitamins Tabs (Multiple Vitamin) .... Take 1 Tablet By Mouth Once A Day 11)  Levothyroxine Sodium 75 Mcg Tabs (Levothyroxine Sodium) .... Take 1 Tablet By Mouth Once A Day 12)  Lasix 40 Mg Tabs (Furosemide) .... Take 1 Tablet By Mouth Once A Day 13)  Klor-Con 10 10 Meq Tbcr (Potassium Chloride) .... Take 1 Tablet By Mouth Two Times A Day 14)   Digitek 0.25 Mg Tabs (Digoxin) .... Take 1 Tablet By Mouth Once A Day 15)  Amitriptyline Hcl 50 Mg  Tabs (Amitriptyline Hcl) .... Take 1 Tablet By Mouth At Bedtime 16)  Fish Oil 1000 Mg Caps (Omega-3 Fatty Acids) .... Take 1 Tablet By Mouth Twice A Day 17)  Relion Blood Glucose Test   Strp (Glucose Blood) .... To Test Blood Sugar Twice Daily 18)  Lancets   Misc (Lancets) .... To Test Blood Sugar Twice Daily 19)  Lisinopril 10 Mg  Tabs (Lisinopril) .... Take 1 Tablet By Mouth Once A Day 20)  Pravachol 20 Mg Tabs (Pravastatin Sodium) .... Take 1/2 Tab By Mouth Before Bedtime and Increase To A Full Tab At Bedtime After One Week. 21)  Anusol-Hc 25 Mg  Supp (Hydrocortisone Acetate) .... Insert One Supp Into Rectum Once A Day  Allergies (verified): 1)  ! Gemfibrozil (Gemfibrozil) 2)  ! Penicillin 3)  ! Codeine 4)  ! * Latex  Anticoagulation Management History:      The patient is taking warfarin and comes in today for a routine follow up visit.  Positive risk factors for bleeding include history of CVA/TIA and presence of serious comorbidities.  Negative risk factors for bleeding include an age less than 47 years old.  The bleeding index is 'intermediate risk'.  Positive CHADS2 values include History of HTN, History of Diabetes, and Prior Stroke/CVA/TIA.  Negative CHADS2 values include Age > 75 years old.  The start date was 07/22/2004.  Her last INR was 1.6.  Prothrombin time is 14.4.  Anticoagulation responsible provider: Daleen Squibb MD, Maisie Fus.  INR POC: 1.4.  Cuvette Lot#: [id number].  Exp: 09/2009.    Anticoagulation Management Assessment/Plan:      The patient's current anticoagulation dose is Warfarin sodium 5 mg tabs: Take as directed..  The target INR is 2 - 3.  The next INR is due 10/26/2008.  Anticoagulation instructions were given to patient.  Results were reviewed/authorized by Cloyde Reams, RN, BSN.  She was notified by Cloyde Reams RN.         Prior Anticoagulation Instructions: INR 2.3  today.  Continue 5 mg daily except on 2.5 mg Saturdays.   Current Anticoagulation Instructions: INR 1.4  take 1.5 tablets (7.5mg ) x 2 doses.  Then off x 5 days prior to procedure.  When you resume coumadin after colonoscopy take 7.5mg  (1.5 tablets) x 2 doses then resume normal dosage of 1 tablet daily except 1/2 tablet on Saturdays.

## 2010-04-08 NOTE — Medication Information (Signed)
Summary: rov/mlw  Anticoagulant Therapy  Managed by: Bethena Midget, RN, BSN Referring MD: Rollene Rotunda MD PCP: Joaquin Courts  MD Supervising MD: Tenny Craw MD, Gunnar Fusi Indication 1: Atrial Fibrillation (ICD-427.31) Lab Used: [facility] Site: [facility] INR POC 2.0 INR RANGE 2-3  Dietary changes: no    Health status changes: no    Bleeding/hemorrhagic complications: no    Recent/future hospitalizations: no    Any changes in medication regimen? yes       Details: Taking Lortab 7.5mg /500mg   PRN, Keflex 500mg s  BID.   Recent/future dental: no  Any missed doses?: no       Is patient compliant with meds? yes      Comments: Was off for surgery last Friday, off coumadin for 5 days prior. Restarted last Friday or Saturday.   Allergies: 1)  ! Gemfibrozil (Gemfibrozil) 2)  ! Penicillin 3)  ! Codeine 4)  ! * Latex  Anticoagulation Management History:      The patient is taking warfarin and comes in today for a routine follow up visit.  Positive risk factors for bleeding include history of CVA/TIA and presence of serious comorbidities.  Negative risk factors for bleeding include an age less than 78 years old.  The bleeding index is 'intermediate risk'.  Positive CHADS2 values include History of HTN, History of Diabetes, and Prior Stroke/CVA/TIA.  Negative CHADS2 values include Age > 40 years old.  The start date was 07/22/2004.  Her last INR was 1.6.  Anticoagulation responsible provider: Tenny Craw MD, Gunnar Fusi.  INR POC: 2.0.  Cuvette Lot#: [id number].  Exp: 08/2010.    Anticoagulation Management Assessment/Plan:      The patient's current anticoagulation dose is Warfarin sodium 5 mg tabs: Take as directed..  The target INR is 2 - 3.  The next INR is due 08/09/2009.  Anticoagulation instructions were given to patient.  Results were reviewed/authorized by Bethena Midget, RN, BSN.  She was notified by Bethena Midget, RN, BSN.         Prior Anticoagulation Instructions: INR 2.6  The patient is to  continue with the same dose of coumadin.  This dosage includes: one tab daily (5mg ) except 0.5 tab (2.5 mg) on Saturdays.   Pt to have surgery on foot on April 26th. Will get in contact with Dr. Antoine Poche for further guidance on how to manage anticoagulation for this procedure.   Did schedule a follow up on May 5th for next INR, but will reschedule if needed.   Current Anticoagulation Instructions: INR 2.0 Continue 5mg s daily except 2.5mg s on Saturdays. Recheck in 4 weeks.

## 2010-04-08 NOTE — Progress Notes (Signed)
Summary: med refill/gp  Phone Note Refill Request Message from:  Fax from Pharmacy on November 22, 2008 12:03 PM  Refills Requested: Medication #1:  KLOR-CON 10 10 MEQ TBCR Take 1 tablet by mouth two times a day   Last Refilled: 10/20/2008  Method Requested: Electronic Initial call taken by: Chinita Pester RN,  November 22, 2008 12:03 PM    Prescriptions: KLOR-CON 10 10 MEQ TBCR (POTASSIUM CHLORIDE) Take 1 tablet by mouth two times a day  #62 x 5   Entered and Authorized by:   Joaquin Courts  MD   Signed by:   Joaquin Courts  MD on 11/22/2008   Method used:   Electronically to        [facility]* (retail)       [address]       [place], [place]  [address]       Ph: [phone]       Fax: [phone]   RxID:   [id number]
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
## 2010-04-08 NOTE — Progress Notes (Signed)
Summary: pt needs to know when to stop coumadin   Phone Note Call from Patient Call back at [phone]   Caller: Patient Reason for Call: Talk to Nurse, Talk to Doctor Summary of Call: pt having surgey on the 28th of this month and needs to know when to stop coumadin Initial call taken by: Omer Jack,  June 21, 2009 2:59 PM  Follow-up for Phone Call        Called patient and left message on machine that Dr Antoine Poche is aware of pt's upcoming surgery and we will let her know next week when to hold coumadin Follow-up by: Charolotte Capuchin, RN,  June 21, 2009 3:27 PM     Appended Document: pt needs to know when to stop coumadin OK to stop coumadin 5 days before surgery.  No indication for bridging.  Appended Document: pt needs to know when to stop coumadin pt aware to ok to hold coumadin 5 days prior.

## 2010-04-08 NOTE — Procedures (Signed)
Summary: Colonoscopy  Colonoscopy   Imported By: Florinda Marker 08/31/2007 14:40:36  _____________________________________________________________________  External Attachment:    Type:   Image     Comment:   External Document  Appended Document: Colonoscopy   Colonoscopy  Procedure date:  08/22/2003  Findings:       Results: Diverticulosis.         Comments:      Repeat colonoscopy in 7 years. 2012   Colonoscopy  Procedure date:  08/22/2003  Findings:       Results: Diverticulosis.         Comments:      Repeat colonoscopy in 7 years. 2012

## 2010-04-08 NOTE — Medication Information (Signed)
Summary: [name]  Anticoagulant Therapy  Managed by: Bethena Midget, RN, BSN Referring MD: Rollene Rotunda MD PCP: Joaquin Courts  MD Supervising MD: Shirlee Latch MD,  Indication 1: Atrial Fibrillation (ICD-427.31) Lab Used: [facility] Site: [facility] INR POC 2.7 INR RANGE 2-3  Dietary changes: no    Health status changes: no    Bleeding/hemorrhagic complications: no    Recent/future hospitalizations: no    Any changes in medication regimen? no    Recent/future dental: no  Any missed doses?: no       Is patient compliant with meds? yes      Comments: Pending Foot Sx on Left foot on 09/18/09 with Dr Gae Bon.   Allergies: 1)  ! Gemfibrozil (Gemfibrozil) 2)  ! Penicillin 3)  ! Codeine 4)  ! * Latex  Anticoagulation Management History:      The patient is taking warfarin and comes in today for a routine follow up visit.  Positive risk factors for bleeding include history of CVA/TIA and presence of serious comorbidities.  Negative risk factors for bleeding include an age less than 25 years old.  The bleeding index is 'intermediate risk'.  Positive CHADS2 values include History of HTN, History of Diabetes, and Prior Stroke/CVA/TIA.  Negative CHADS2 values include Age > 9 years old.  The start date was 07/22/2004.  Her last INR was 1.6.  Anticoagulation responsible provider: Shirlee Latch MD, .  INR POC: 2.7.  Cuvette Lot#: [id number].  Exp: 11/2010.    Anticoagulation Management Assessment/Plan:      The patient's current anticoagulation dose is Warfarin sodium 5 mg tabs: Take as directed..  The target INR is 2 - 3.  The next INR is due 10/03/2009.  Anticoagulation instructions were given to patient.  Results were reviewed/authorized by Bethena Midget, RN, BSN.  She was notified by Bethena Midget, RN, BSN.         Prior Anticoagulation Instructions: INR 2.9  Continue on same dosage 1 tablet daily except 1/2 tablet on Saturdays.  Recheck in 4 weeks.    Current Anticoagulation  Instructions: INR 2.7 Continue 5mg s everyday except 2.5mg s on Saturdays. Recheck in 4 weeks.  Tentativly take last dose on 09/12/09.

## 2010-04-08 NOTE — Progress Notes (Signed)
Summary: refill/ hla  Phone Note Refill Request Message from:  Fax from Pharmacy on October 22, 2008 3:15 PM  Refills Requested: Medication #1:  METFORMIN HCL 1000 MG TABS Take 1 tablet by mouth two times a day   Last Refilled: 7/19 Initial call taken by: Marin Roberts RN,  October 22, 2008 3:15 PM  Follow-up for Phone Call        Refill approved-nurse to complete Follow-up by: Joaquin Courts  MD,  October 23, 2008 9:43 PM    Prescriptions: METFORMIN HCL 1000 MG TABS (METFORMIN HCL) Take 1 tablet by mouth two times a day  #62 x 9   Entered and Authorized by:   Joaquin Courts  MD   Signed by:   Joaquin Courts  MD on 10/23/2008   Method used:   Electronically to        [facility]* (retail)       [address]       [place], [place]  [address]       Ph: [phone]       Fax: [phone]   RxID:   [id number]

## 2010-04-08 NOTE — Progress Notes (Signed)
Summary: refill/hla  Phone Note Refill Request Message from:  Fax from Pharmacy on December 12, 2007 10:18 AM  Refills Requested: Medication #1:  KLOR-CON 10 10 MEQ TBCR Take 1 tablet by mouth two times a day   Last Refilled: 9/12 Initial call taken by: Marin Roberts RN,  December 12, 2007 10:19 AM  Follow-up for Phone Call        Refill sent electronically.  Do you mind reminding her she is due for a routine checkup with [place]?  Thanks! Follow-up by: Joaquin Courts  MD,  December 12, 2007 11:19 AM  Additional Follow-up for Phone Call Additional follow up Details #1::        Rx called to pharmacy.  Additional Follow-up by: Marin Roberts RN,  December 14, 2007 12:15 PM      Prescriptions: KLOR-CON 10 10 MEQ TBCR (POTASSIUM CHLORIDE) Take 1 tablet by mouth two times a day  #62 x 5   Entered and Authorized by:   Joaquin Courts  MD   Signed by:   Joaquin Courts  MD on 12/12/2007   Method used:   Electronically to        [facility]* (retail)       [address]       [place], [place]  [address]       Ph: [phone]       Fax: [phone]   RxID:   [id number]
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
## 2010-04-08 NOTE — Assessment & Plan Note (Signed)
Summary: FU VISIT/DS   Vital Signs:  Patient Profile:   60 Years Old Female Height:     63 inches (160.02 cm) Weight:      208.7 pounds (94.86 kg) BMI:     37.10 Temp:     97.7 degrees F (36.50 degrees C) oral Pulse rate:   93 / minute BP sitting:   123 / 74  (left arm) Cuff size:   regular  Pt. in pain?   yes    Location:   R/ HIP    Intensity:   6    Type:       PULLING  Vitals Entered By: Theotis Barrio NT II (August 30, 2007 4:19 PM)              Is Patient Diabetic? Yes  Nutritional Status BMI of > 30 = obese  Have you ever been in a relationship where you felt threatened, hurt or afraid?No   Does patient need assistance? Functional Status Self care Ambulation Normal     PCP:  Peggye Pitt, MD  Chief Complaint:  MEDICATION REFILL / FELL IN TUB-SEEN IN ED .  History of Present Illness: She is here today with no acute complaints. She fell while in her bathtub about 2 weeks ago and hit her hip and head with the toilet. She went to the ED where xrays of her hip, head and c-spine were performed with no evidence of fractures or dislocations. She is running out of her DM meds and would like a refill on those as well.    Prior Medication List:  METFORMIN HCL 1000 MG TABS (METFORMIN HCL) Take 1 tablet by mouth two times a day GLIPIZIDE 10 MG  TABS (GLIPIZIDE) one  tab two times a day DILTIAZEM HCL CR 180 MG CP24 (DILTIAZEM HCL) Take 1 tablet by mouth once a day DARVOCET-N 100 100-650 MG TABS (PROPOXYPHENE N-APAP) Take 1 tablet by mouth every 4 hours as needed for pain WARFARIN SODIUM 5 MG TABS (WARFARIN SODIUM) Take 1 tablet by mouth once a day LEXAPRO 10 MG TABS (ESCITALOPRAM OXALATE) Take 1 tablet by mouth once a day ATENOLOL 25 MG TABS (ATENOLOL) Take 1 tablet by mouth two times a day OMEPRAZOLE 20 MG CPDR (OMEPRAZOLE) Take 1 tablet by mouth once a day CALCARB 600/D 600-125 MG-UNIT TABS (CALCIUM-VITAMIN D) Take 1 tablet by mouth two times a day MULTIVITAMINS TABS  (MULTIPLE VITAMIN) Take 1 tablet by mouth once a day LEVOTHYROXINE SODIUM 75 MCG TABS (LEVOTHYROXINE SODIUM) Take 1 tablet by mouth once a day LASIX 40 MG TABS (FUROSEMIDE) Take 1 tablet by mouth once a day KLOR-CON 10 10 MEQ TBCR (POTASSIUM CHLORIDE) Take 1 tablet by mouth two times a day DIGITEK 0.25 MG TABS (DIGOXIN) Take 1 tablet by mouth once a day AMITRIPTYLINE HCL 50 MG  TABS (AMITRIPTYLINE HCL) Take 1 tablet by mouth at bedtime FISH OIL 1000 MG CAPS (OMEGA-3 FATTY ACIDS) Take 1 tablet by mouth once a day FLEXERIL 5 MG  TABS (CYCLOBENZAPRINE HCL) Take 1 tab by mouth at bedtime for 5 days RELION BLOOD GLUCOSE TEST   STRP (GLUCOSE BLOOD) to test blood sugar twice daily LANCETS   MISC (LANCETS) to test blood sugar twice daily LISINOPRIL 10 MG  TABS (LISINOPRIL) Take 1 tablet by mouth once a day ROBITUSSIN DM 100-10 MG/5ML  SYRP (DEXTROMETHORPHAN-GUAIFENESIN) 1 tsp every 4 hours as need for cough   Current Allergies: ! GEMFIBROZIL (GEMFIBROZIL) ! PENICILLIN ! CODEINE ! * LATEX  Past  Medical History:    Reviewed history from 05/07/2006 and no changes required:       Atrial fibrillation       Diabetes mellitus, type II       Diverticulosis, colon       Hypertension       Hypothyroidism       Osteoarthritis       Esophageal stricture, hx of       Hiatal hernia, hx of, s/p       Cholecystitis, hx of, s/p cholecystectomy       Obstructive sleep apnea       Sarcoidosis       Diabetic neuropathy       Atypical chest pain, hx of       Benign right breast mass, hx of (papilloma)       Diabetic Neuropathy  Past Surgical History:    Reviewed history from 04/05/2006 and no changes required:       Hiatal hernia repair       Cholecystectomy       Esophageal dilation x 2       Hysterectomy       Excision of milk duct of right breast -> benign (1/08)   Social History:    Reviewed history from 04/05/2006 and no changes required:       Never Smoked       Alcohol use-no       Drug  use-no       Divorced       On disability   Risk Factors:  Tobacco use:  never Drug use:  no Alcohol use:  no Exercise:  yes    Type:  WALKING Seatbelt use:  100 %   Review of Systems  The patient denies fever, weight loss, weight gain, chest pain, syncope, dyspnea on exertion, abdominal pain, melena, hematochezia, and severe indigestion/heartburn.     Physical Exam  General:     Well-developed,well-nourished,in no acute distress; alert,appropriate and cooperative throughout examination Head:     normocephalic and atraumatic.   Eyes:     vision grossly intact, pupils equal, pupils round, and pupils reactive to light.   Ears:     R ear normal and L ear normal.   Neck:     No bruits/goiter Lungs:     Normal respiratory effort, chest expands symmetrically. Lungs are clear to auscultation, no crackles or wheezes. Heart:     normal rate, no murmur, no gallop, no rub, and irregular rhyth (history of a.fib) Extremities:     No clubbing, cyanosis, edema.    Impression & Recommendations:  Problem # 1:  HYPERLIPIDEMIA (ICD-272.4) LDL not at goal for a diabetic, but I am unable to start a statin at this time 2/2 to elevated transaminases.  Labs Reviewed: Chol: 180 (06/16/2007)   HDL: 37 (06/16/2007)   LDL: 105 (06/16/2007)   TG: 192 (06/16/2007) SGOT: 45 (08/04/2007)   SGPT: 67 (08/04/2007)   Problem # 2:  FATTY LIVER DISEASE (ICD-571.8) This is the cause of her elevated transaminases according to her RUQ ultrasound. Have advised her that weight loss might help. Her DM is already well controlled.  Problem # 3:  HEALTH MAINTENANCE EXAM (ICD-V70.0) Has had complete hysterectomy. (no more PAP smears). Will get DEXA scan as she is 25 years post-menopausal (next visit) Mammograms up to date. Next due August 09. Per patient had colonoscopy 3 years ago....will obtain result with the [facility] office.   Problem #  4:  DIABETES MELLITUS, TYPE II (ICD-250.00) Well controlled.  Continue current regimen with no changes at this time.  Her updated medication list for this problem includes:    Metformin Hcl 1000 Mg Tabs (Metformin hcl) .Marland Kitchen... Take 1 tablet by mouth two times a day    Glipizide 10 Mg Tabs (Glipizide) ..... One  tab two times a day    Lisinopril 10 Mg Tabs (Lisinopril) .Marland Kitchen... Take 1 tablet by mouth once a day  Orders: T-Hgb A1C (in-house) ([id number])  Labs Reviewed: HgBA1c: 6.4 (08/30/2007)   Creat: 0.74 (08/04/2007)      Problem # 5:  HYPOTHYROIDISM (ICD-244.9) On stable dose of synthroid. Does not need a repeat TSH until 03/10.  Her updated medication list for this problem includes:    Levothyroxine Sodium 75 Mcg Tabs (Levothyroxine sodium) .Marland Kitchen... Take 1 tablet by mouth once a day  Labs Reviewed: TSH: 0.876 (06/01/2007)    HgBA1c: 6.4 (08/30/2007) Chol: 180 (06/16/2007)   HDL: 37 (06/16/2007)   LDL: 105 (06/16/2007)   TG: 192 (06/16/2007)   Problem # 6:  ATRIAL FIBRILLATION (ICD-427.31) Followed by [facility]. They also monitor her coumadin and INR levels.  Her updated medication list for this problem includes:    Warfarin Sodium 5 Mg Tabs (Warfarin sodium) .Marland Kitchen... Take 1 tablet by mouth once a day    Atenolol 25 Mg Tabs (Atenolol) .Marland Kitchen... Take 1 tablet by mouth two times a day    Digitek 0.25 Mg Tabs (Digoxin) .Marland Kitchen... Take 1 tablet by mouth once a day   Problem # 7:  HYPERTENSION (ICD-401.9) Excellent BP. Continue current regimen.  Her updated medication list for this problem includes:    Diltiazem Hcl Cr 180 Mg Cp24 (Diltiazem hcl) .Marland Kitchen... Take 1 tablet by mouth once a day    Atenolol 25 Mg Tabs (Atenolol) .Marland Kitchen... Take 1 tablet by mouth two times a day    Lasix 40 Mg Tabs (Furosemide) .Marland Kitchen... Take 1 tablet by mouth once a day    Lisinopril 10 Mg Tabs (Lisinopril) .Marland Kitchen... Take 1 tablet by mouth once a day  BP today: 123/74 Prior BP: 112/63 (08/09/2007)  Labs Reviewed: Creat: 0.74 (08/04/2007) Chol: 180 (06/16/2007)   HDL: 37 (06/16/2007)    LDL: 105 (06/16/2007)   TG: 192 (06/16/2007)   Complete Medication List: 1)  Metformin Hcl 1000 Mg Tabs (Metformin hcl) .... Take 1 tablet by mouth two times a day 2)  Glipizide 10 Mg Tabs (Glipizide) .... One  tab two times a day 3)  Diltiazem Hcl Cr 180 Mg Cp24 (Diltiazem hcl) .... Take 1 tablet by mouth once a day 4)  Darvocet-n 100 100-650 Mg Tabs (Propoxyphene n-apap) .... Take 1 tablet by mouth every 4 hours as needed for pain 5)  Warfarin Sodium 5 Mg Tabs (Warfarin sodium) .... Take 1 tablet by mouth once a day 6)  Lexapro 10 Mg Tabs (Escitalopram oxalate) .... Take 1 tablet by mouth once a day 7)  Atenolol 25 Mg Tabs (Atenolol) .... Take 1 tablet by mouth two times a day 8)  Omeprazole 20 Mg Cpdr (Omeprazole) .... Take 1 tablet by mouth once a day 9)  Calcarb 600/d 600-125 Mg-unit Tabs (Calcium-vitamin d) .... Take 1 tablet by mouth two times a day 10)  Multivitamins Tabs (Multiple vitamin) .... Take 1 tablet by mouth once a day 11)  Levothyroxine Sodium 75 Mcg Tabs (Levothyroxine sodium) .... Take 1 tablet by mouth once a day 12)  Lasix 40 Mg Tabs (Furosemide) .... Take  1 tablet by mouth once a day 13)  Klor-con 10 10 Meq Tbcr (Potassium chloride) .... Take 1 tablet by mouth two times a day 14)  Digitek 0.25 Mg Tabs (Digoxin) .... Take 1 tablet by mouth once a day 15)  Amitriptyline Hcl 50 Mg Tabs (Amitriptyline hcl) .... Take 1 tablet by mouth at bedtime 16)  Fish Oil 1000 Mg Caps (Omega-3 fatty acids) .... Take 1 tablet by mouth once a day 17)  Flexeril 5 Mg Tabs (Cyclobenzaprine hcl) .... Take 1 tab by mouth at bedtime for 5 days 18)  Relion Blood Glucose Test Strp (Glucose blood) .... To test blood sugar twice daily 19)  Lancets Misc (Lancets) .... To test blood sugar twice daily 20)  Lisinopril 10 Mg Tabs (Lisinopril) .... Take 1 tablet by mouth once a day 21)  Robitussin Dm 100-10 Mg/86ml Syrp (Dextromethorphan-guaifenesin) .Marland Kitchen.. 1 tsp every 4 hours as need for cough   Patient  Instructions: 1)  Please schedule a follow-up appointment in 2 months.   Prescriptions: OMEPRAZOLE 20 MG CPDR (OMEPRAZOLE) Take 1 tablet by mouth once a day  #30 x 5   Entered and Authorized by:   Peggye Pitt MD   Signed by:   Peggye Pitt MD on 08/30/2007   Method used:   Electronically sent to ...       [facility]*       [address]       [place], [place]  [address]       Ph: [phone]       Fax: [phone]   RxID:   [id number] LISINOPRIL 10 MG  TABS (LISINOPRIL) Take 1 tablet by mouth once a day  #30 x 12   Entered and Authorized by:   Peggye Pitt MD   Signed by:   Peggye Pitt MD on 08/30/2007   Method used:   Electronically sent to ...       [facility]*       [address]       [place], [place]  [address]       Ph: [phone]       Fax: [phone]   RxID:   [id number] GLIPIZIDE 10 MG  TABS (GLIPIZIDE) one  tab two times a day  #60 x 12   Entered and Authorized by:   Peggye Pitt MD   Signed by:   Peggye Pitt MD on 08/30/2007   Method used:   Electronically sent to ...       [facility]*       [address]       [place], [place]  [address]       Ph: [phone]       Fax: [phone]   RxID:   [id number] METFORMIN HCL 1000 MG TABS (METFORMIN HCL) Take 1 tablet by mouth two times a day  #62 x 12   Entered and Authorized by:   Peggye Pitt MD   Signed by:   Peggye Pitt MD on 08/30/2007   Method used:   Electronically sent to ...       [facility]*       [address]       [place], [place]  [address]  Ph: [phone]       Fax: [phone]   RxID:   [id number]  ] Laboratory Results   Blood Tests   Date/Time Received: .Krystal Eaton [facility])  August 30, 2007 5:04 PM  Date/Time Reported: .Krystal Eaton [facility])  August 30, 2007 5:04 PM   HGBA1C: 6.4%   (Normal Range: Non-Diabetic - 3-6%   Control Diabetic - 6-8%)

## 2010-04-08 NOTE — Assessment & Plan Note (Signed)
Summary: REASSIGNED NEW TO DR/CFB   Vital Signs:  Patient profile:   60 year old female Menstrual status:  postmenopausal Height:      63 inches (160.02 cm) Weight:      203.0 pounds (92.27 kg) BMI:     36.09 Temp:     98.0 degrees F oral Pulse rate:   102 / minute BP sitting:   119 / 70  (right arm)  Vitals Entered By: Chinita Pester RN (November 07, 2009 9:17 AM) CC: New to MD.  Back/left/left groin shoulder pain. Nosebleed.  Perineal "sore". Is Patient Diabetic? Yes Did you bring your meter with you today? No Pain Assessment Patient in pain? yes     Location: left shoulder/back Intensity: 5 Type: aching Onset of pain  Intermittent Nutritional Status BMI of > 30 = obese  Have you ever been in a relationship where you felt threatened, hurt or afraid?No   Does patient need assistance? Functional Status Self care Ambulation Normal   Primary Care Provider:  Joaquin Courts  MD  CC:  New to MD.  Back/left/left groin shoulder pain. Nosebleed.  Perineal "sore"..  History of Present Illness: [name] is a 60 year old woman with pmh significant for HTN, Depression, HLD, Fatty liver, carotid stenosis, DM II, A-fib, Hypothyroidism and OA who is here today for:  1) Chronic lower back pain - Intermittent, but when pain hits, it is very severe 10/10.  2) L. shoulder pain - dull ache, related to arthritis.   3) Nose bleeds - INR was supratherapeutic. Patient had 3 episodes of nose bleeds in last 2 weeks.  4) Surgery on right foot 04/28/2011and surgery on left foot 07/13/2011secondary to severe bunions.  5) DM II - Blood sugars run between 115-175. No hypoglycemic episodes. Chronic neuropathy in feet.   6) Afib - followed by Dr. Antoine Poche    Depression History:      The patient denies a depressed mood most of the day and a diminished interest in her usual daily activities.         Preventive Screening-Counseling & Management  Alcohol-Tobacco     Alcohol drinks/day: 0    Smoking Status: never  Caffeine-Diet-Exercise     Does Patient Exercise: yes     Type of exercise: WALKING sometimes  Current Medications (verified): 1)  Metformin Hcl 1000 Mg Tabs (Metformin Hcl) .... Take 1 Tablet By Mouth Two Times A Day 2)  Glipizide 10 Mg  Tabs (Glipizide) .... Take 1 Tablet By Mouth Two Times A Day 3)  Diltiazem Hcl Cr 180 Mg Cp24 (Diltiazem Hcl) .... Take 1 Tablet By Mouth Once A Day 4)  Warfarin Sodium 5 Mg Tabs (Warfarin Sodium) .... Take As Directed. 5)  Atenolol 25 Mg Tabs (Atenolol) .... Take 1 Tablet By Mouth Two Times A Day 6)  Omeprazole 20 Mg Cpdr (Omeprazole) .... Take 1 Tablet By Mouth Once A Day Prn 7)  Calcium 600/vitamin D 600-400 Mg-Unit Tabs (Calcium Carbonate-Vitamin D) .... Take 1 Tablet By Mouth Two Times A Day 8)  Multivitamins Tabs (Multiple Vitamin) .... Take 1 Tablet By Mouth Once A Day 9)  Levothyroxine Sodium 75 Mcg Tabs (Levothyroxine Sodium) .... Take 1 Tablet By Mouth Once A Day 10)  Lasix 40 Mg Tabs (Furosemide) .... Take 1 Tablet By Mouth Once A Day 11)  Klor-Con 10 10 Meq Tbcr (Potassium Chloride) .... Take 1 Tablet By Mouth Two Times A Day 12)  Digitek 0.25 Mg Tabs (Digoxin) .... Take 1  Tablet By Mouth Once A Day 13)  Fish Oil 1000 Mg Caps (Omega-3 Fatty Acids) .... Take 1 Tablet By Mouth Twice A Day 14)  Relion Blood Glucose Test   Strp (Glucose Blood) .... To Test Blood Sugar Twice Daily 15)  Lancets   Misc (Lancets) .... To Test Blood Sugar Twice Daily 16)  Lisinopril 10 Mg  Tabs (Lisinopril) .... Take 1 Tablet By Mouth Once A Day 17)  Pravachol 40 Mg Tabs (Pravastatin Sodium) .... Two Tablets By Mouth Once Daily. 18)  Prozac 20 Mg Caps (Fluoxetine Hcl) .... Take 1 Tablet By Mouth Every Morning 19)  Flexeril 5 Mg Tabs (Cyclobenzaprine Hcl) .... Take 1 Tab By Mouth Every 12 Hours As Needed For Pain. 20)  Niacin 500 Mg Tabs (Niacin) .... Two Tabs By Mouth Twice A Day. 21)  Amitriptyline Hcl 50 Mg Tabs (Amitriptyline Hcl) .... Take  1 Tab By Mouth At Bedtime  Allergies (verified): 1)  ! Gemfibrozil (Gemfibrozil) 2)  ! Penicillin 3)  ! Codeine 4)  ! * Latex  Past History:  Past Medical History: Last updated: 11/02/2008 Atrial fibrillation Diabetes mellitus, type II Diverticulosis, colon Hypertension Hypothyroidism Osteoarthritis Esophageal stricture, hx of Hiatal hernia, hx of, s/p Cholecystitis, hx of, s/p cholecystectomy Obstructive sleep apnea Sarcoidosis Diabetic neuropathy Atypical chest pain, hx of Benign right breast mass, hx of (papilloma) Diabetic Neuropathy Fibromyalgia  Past Surgical History: Last updated: 11/02/2008 Hiatal hernia repair Cholecystectomy Esophageal dilation x 2 Hysterectomy Excision of milk duct of right breast -> benign (1/08) Hernia Surgery(femoral)  Family History: Last updated: 11/02/2008 Maternal uncle w/ dementia.   Melenoma: Father Family History of Colitis/Crohn's:Mother Family History of Diabetes: Father & Sister Family History of Heart Disease: Father  Social History: Last updated: 11/02/2008 Never Smoked Alcohol use-no Drug use-no Divorced On disability  Risk Factors: Alcohol Use: 0 (11/07/2009) Exercise: yes (11/07/2009)  Risk Factors: Smoking Status: never (11/07/2009)  Review of Systems      See HPI  Physical Exam  General:  alert and well-developed.   Head:  normocephalic and atraumatic.   Eyes:  vision grossly intact, pupils equal, pupils round, and pupils reactive to light.   Mouth:  good dentition.   Neck:  supple, full ROM, and no JVD.   Lungs:  normal respiratory effort, no accessory muscle use, normal breath sounds, and no crackles.   Heart:  no murmur, no gallop, no rub, and irregular rhythm.   Abdomen:  soft, non-tender, and normal bowel sounds.   Msk:  normal ROM.   s/p bunion surgeries in both feet, left foot most recent, with some residual brusing and swelling from surgery  Pulses:  equal bilat Extremities:  no lower  extremity edema present  Neurologic:  alert & oriented X3 and strength normal in all extremities.     Impression & Recommendations:  Problem # 1:  DIABETIC PERIPHERAL NEUROPATHY (ICD-250.60) Assessment Deteriorated Patient states her neuropathy has worsened. I don't see that patient has been started on gabapentin for neuropathic symptoms. Will try her on low dose neurontin and titrate up as needed. Will continue amitriptyline. Advised patient of side effects of gabapentin and told her to take at bedtime. Will continue current diabetes regimen.   Her updated medication list for this problem includes:    Metformin Hcl 1000 Mg Tabs (Metformin hcl) .Marland Kitchen... Take 1 tablet by mouth two times a day    Glipizide 10 Mg Tabs (Glipizide) .Marland Kitchen... Take 1 tablet by mouth two times a day  Lisinopril 10 Mg Tabs (Lisinopril) .Marland Kitchen... Take 1 tablet by mouth once a day    Neurontin 100 mg Tabs ....take one tab at bedtime   Problem # 2:  DEPRESSION (ICD-311) No new acute episodes, states the medication is working well for her. Will continue current regimen.   Her updated medication list for this problem includes:    Prozac 20 Mg Caps (Fluoxetine hcl) .Marland Kitchen... Take 1 tablet by mouth every morning    Amitriptyline Hcl 50 Mg Tabs (Amitriptyline hcl) .Marland Kitchen... Take 1 tab by mouth at bedtime  Problem # 3:  DIABETES MELLITUS, TYPE II (ICD-250.00) Assessment: Deteriorated A1C has slightly increased since last checked which was in March 2011. I will not make any changes to her regimen, but stressed the importance of diet and nutrition, and that she may need insulin in future if DM II not well controlled with oral hyperglycemic meds.  Patient is on low dose ACE-I for renal protection. Pt is up to date with eye exam. Her feet have decreased sensation, however there are no lesions or ulcers.   Her updated medication list for this problem includes:    Metformin Hcl 1000 Mg Tabs (Metformin hcl) .Marland Kitchen... Take 1 tablet by mouth two times  a day    Glipizide 10 Mg Tabs (Glipizide) .Marland Kitchen... Take 1 tablet by mouth two times a day    Lisinopril 10 Mg Tabs (Lisinopril) .Marland Kitchen... Take 1 tablet by mouth once a day  Orders: T- Capillary Blood Glucose (98119) T-Hgb A1C (in-house) (14782NF)  Labs Reviewed: Creat: 0.69 (07/22/2009)    Reviewed HgBA1c results: 7.1 (11/07/2009)  6.4 (05/09/2009)  Problem # 4:  HYPERLIPIDEMIA (ICD-272.4) Assessment: Deteriorated  Patient's triglycerides are greater than 500 and I noticed she is allergic to gemfibrozil. I will start patient on Tricor, and continue pravachol at current dose which is 80 mg.  Will have patient follow up in 2-3 months and recheck fasting lipid panel.   Her updated medication list for this problem includes:    Pravachol 40 Mg Tabs (Pravastatin sodium) .Marland Kitchen..Marland Kitchen Two tablets by mouth once daily.    Niacin 500 Mg Tabs (Niacin) .Marland Kitchen..Marland Kitchen Two tabs by mouth twice a day.    Tricor 145 Mg Tabs (Fenofibrate) .Marland Kitchen... Take 1 tablet by mouth once a day  Labs Reviewed: SGOT: 38 (07/22/2009)   SGPT: 43 (07/22/2009)  Prior 10 Yr Risk Heart Disease: Not enough information (05/28/2009)   HDL:37 (07/22/2009), 36 (05/09/2009)  LDL:* mg/dL (62/13/0865), * mg/dL (78/46/9629)  BMWU:132 (07/22/2009), 168 (05/09/2009)  Trig:427 (07/22/2009), 506 (05/09/2009)  Problem # 5:  BACK PAIN, LUMBAR (ICD-724.2) Assessment: Comment Only Patient has chronic back pain that has been managed primarily by flexeril and occasional vicodin. I will continue this regimen, and suggest physical therapy and other strengthening exercises.   Her updated medication list for this problem includes:    Flexeril 5 Mg Tabs (Cyclobenzaprine hcl) .Marland Kitchen... Take 1 tab by mouth every 12 hours as needed for pain.    Vicodin 5-500 Mg Tabs (Hydrocodone-acetaminophen) .Marland Kitchen... 1 tab every 12 hours as needed for pain  Problem # 6:  HYPOTHYROIDISM (ICD-244.9) Assessment: Comment Only No symptoms or complaints related to hypothyroidism. No new TSH  checked since June 2010. It is common for hypothyroid patients to have elevation in cholesterol and triglyceride. Along with triglyceride management as discussed in problem #4, will also check TSH today and adjust synthroid accordingly.   Her updated medication list for this problem includes:    Levothyroxine Sodium 75 Mcg Tabs (  Levothyroxine sodium) .Marland Kitchen... Take 1 tablet by mouth once a day  Orders: T-TSH [id number])  Labs Reviewed: TSH: 1.442 (08/24/2008)    HgBA1c: 7.1 (11/07/2009) Chol: 129 (07/22/2009)   HDL: 37 (07/22/2009)   LDL: * mg/dL (09/81/1914)   TG: 782 (07/22/2009)  Complete Medication List: 1)  Metformin Hcl 1000 Mg Tabs (Metformin hcl) .... Take 1 tablet by mouth two times a day 2)  Glipizide 10 Mg Tabs (Glipizide) .... Take 1 tablet by mouth two times a day 3)  Diltiazem Hcl Cr 180 Mg Cp24 (Diltiazem hcl) .... Take 1 tablet by mouth once a day 4)  Warfarin Sodium 5 Mg Tabs (Warfarin sodium) .... Take as directed. 5)  Atenolol 25 Mg Tabs (Atenolol) .... Take 1 tablet by mouth two times a day 6)  Omeprazole 20 Mg Cpdr (Omeprazole) .... Take 1 tablet by mouth once a day prn 7)  Calcium 600/vitamin D 600-400 Mg-unit Tabs (Calcium carbonate-vitamin d) .... Take 1 tablet by mouth two times a day 8)  Multivitamins Tabs (Multiple vitamin) .... Take 1 tablet by mouth once a day 9)  Levothyroxine Sodium 75 Mcg Tabs (Levothyroxine sodium) .... Take 1 tablet by mouth once a day 10)  Lasix 40 Mg Tabs (Furosemide) .... Take 1 tablet by mouth once a day 11)  Klor-con 10 10 Meq Tbcr (Potassium chloride) .... Take 1 tablet by mouth two times a day 12)  Digitek 0.25 Mg Tabs (Digoxin) .... Take 1 tablet by mouth once a day 13)  Fish Oil 1000 Mg Caps (Omega-3 fatty acids) .... Take 1 tablet by mouth twice a day 14)  Relion Blood Glucose Test Strp (Glucose blood) .... To test blood sugar twice daily 15)  Lancets Misc (Lancets) .... To test blood sugar twice daily 16)  Lisinopril 10 Mg Tabs  (Lisinopril) .... Take 1 tablet by mouth once a day 17)  Pravachol 40 Mg Tabs (Pravastatin sodium) .... Two tablets by mouth once daily. 18)  Prozac 20 Mg Caps (Fluoxetine hcl) .... Take 1 tablet by mouth every morning 19)  Flexeril 5 Mg Tabs (Cyclobenzaprine hcl) .... Take 1 tab by mouth every 12 hours as needed for pain. 20)  Niacin 500 Mg Tabs (Niacin) .... Two tabs by mouth twice a day. 21)  Amitriptyline Hcl 50 Mg Tabs (Amitriptyline hcl) .... Take 1 tab by mouth at bedtime 22)  Vicodin 5-500 Mg Tabs (Hydrocodone-acetaminophen) .Marland Kitchen.. 1 tab every 12 hours as needed for pain 23)  Gabapentin 100 Mg Caps (Gabapentin) .... Take 1 tab by mouth at bedtime 24)  Tricor 145 Mg Tabs (Fenofibrate) .... Take 1 tablet by mouth once a day  Other Orders: T-Basic Metabolic Panel [id number]) Influenza Vaccine MCR [id number])  Patient Instructions: 1)  Please schedule a follow-up appointment in 3 months. 2)  Check your blood sugars regularly. If your readings are usually above 180 : or below 70 you should contact our office. Prescriptions: TRICOR 145 MG TABS (FENOFIBRATE) Take 1 tablet by mouth once a day  #31 x 3   Entered and Authorized by:   Melida Quitter MD   Signed by:   Melida Quitter MD on 11/07/2009   Method used:   Telephoned to ...       [facility]* (retail)       [address]       [place], [place]  [address]       Ph: [phone]  Fax: [phone]   RxID:   [id number] GABAPENTIN 100 MG CAPS (GABAPENTIN) Take 1 tab by mouth at bedtime  #31 x 0   Entered and Authorized by:   Melida Quitter MD   Signed by:   Melida Quitter MD on 11/07/2009   Method used:   Print then Give to Patient   RxID:   [id number] FLEXERIL 5 MG TABS (CYCLOBENZAPRINE HCL) Take 1 tab by mouth every 12 hours as needed for pain.  #60 x 1   Entered and Authorized by:   Melida Quitter MD   Signed by:   Melida Quitter MD on 11/07/2009   Method used:   Print then Give  to Patient   RxID:   [id number] METFORMIN HCL 1000 MG TABS (METFORMIN HCL) Take 1 tablet by mouth two times a day  #62 x 9   Entered and Authorized by:   Melida Quitter MD   Signed by:   Melida Quitter MD on 11/07/2009   Method used:   Print then Give to Patient   RxID:   [id number] AMITRIPTYLINE HCL 50 MG TABS (AMITRIPTYLINE HCL) Take 1 tab by mouth at bedtime  #31 x 2   Entered and Authorized by:   Melida Quitter MD   Signed by:   Melida Quitter MD on 11/07/2009   Method used:   Print then Give to Patient   RxID:   [id number] DIGITEK 0.25 MG TABS (DIGOXIN) Take 1 tablet by mouth once a day  #30 x 8   Entered and Authorized by:   Melida Quitter MD   Signed by:   Melida Quitter MD on 11/07/2009   Method used:   Print then Give to Patient   RxID:   [id number] WARFARIN SODIUM 5 MG TABS (WARFARIN SODIUM) Take as directed.  #30 x 3   Entered and Authorized by:   Melida Quitter MD   Signed by:   Melida Quitter MD on 11/07/2009   Method used:   Print then Give to Patient   RxID:   [id number] VICODIN 5-500 MG TABS (HYDROCODONE-ACETAMINOPHEN) 1 tab every 12 hours as needed for pain  #60 x 0   Entered and Authorized by:   Melida Quitter MD   Signed by:   Melida Quitter MD on 11/07/2009   Method used:   Print then Give to Patient   RxID:   [id number]   Prevention & Chronic Care Immunizations   Influenza vaccine: Fluvax MCR  (11/07/2009)   Influenza vaccine deferral: Not available  (08/24/2008)   Influenza vaccine due: 11/07/2008    Tetanus booster: Not documented   Td booster deferral: Deferred  (08/24/2008)    Pneumococcal vaccine: Not documented   Pneumococcal vaccine deferral: Deferred  (08/24/2008)    H. zoster vaccine: Not documented   H. zoster vaccine deferral: Deferred  (08/24/2008)  Colorectal Screening   Hemoccult: Not documented   Hemoccult action/deferral: Ordered  (08/24/2008)    Colonoscopy: Location:  [facility].     (10/22/2008)   Colonoscopy action/deferral: Repeat colonoscopy in 7 years. 2012  (08/22/2003)   Colonoscopy due: 05/2010  Other Screening   Pap smear: Not documented   Pap smear action/deferral: Not indicated S/P hysterectomy  (08/24/2008)    Mammogram: ASSESSMENT: Negative - BI-RADS 1^MM DIGITAL SCREENING  (11/19/2008)   Mammogram due: 10/2008    DXA bone density scan:  Lumbar Spine:  T Score > -1.0 Spine.  Hip Total: T Score -2.5 to -1.0 Hip.      (03/20/2008)  DXA scan due: 03/2010    Smoking status: never  (11/07/2009)  Diabetes Mellitus   HgbA1C: 7.1  (11/07/2009)   HgbA1C action/deferral: Ordered  (12/04/2008)    Eye exam: Not documented   Diabetic eye exam action/deferral: Deferred  (05/09/2009)    Foot exam: yes  (12/04/2008)   Foot exam action/deferral: Do today   High risk foot: Not documented   Foot care education: Not documented   Foot exam due: 03/13/2009    Urine microalbumin/creatinine ratio: 23.8  (12/15/2007)   Urine microalbumin action/deferral: Not indicated    Diabetes flowsheet reviewed?: Yes   Progress toward A1C goal: Deteriorated  Lipids   Total Cholesterol: 129  (07/22/2009)   Lipid panel action/deferral: Lipid Panel ordered   LDL: * mg/dL  (78/46/9629)   LDL Direct: Not documented   HDL: 37  (07/22/2009)   Triglycerides: 427  (07/22/2009)    SGOT (AST): 38  (07/22/2009)   SGPT (ALT): 43  (07/22/2009)   Alkaline phosphatase: 68  (07/22/2009)   Total bilirubin: 0.8  (07/22/2009)    Lipid flowsheet reviewed?: Yes   Progress toward LDL goal: Improved  Hypertension   Last Blood Pressure: 119 / 70  (11/07/2009)   Serum creatinine: 0.69  (07/22/2009)   BMP action: Ordered   Serum potassium 4.0  (07/22/2009)    Hypertension flowsheet reviewed?: Yes   Progress toward BP goal: At goal  Self-Management Support :   Personal Goals (by the next clinic visit) :     Personal A1C goal: 7  (11/07/2009)     Personal blood pressure goal:  130/80  (11/07/2009)     Personal LDL goal: 70  (11/07/2009)    Patient will work on the following items until the next clinic visit to reach self-care goals:     Medications and monitoring: take my medicines every day, bring all of my medications to every visit, examine my feet every day  (11/07/2009)     Eating: drink diet soda or water instead of juice or soda, eat more vegetables, use fresh or frozen vegetables, eat foods that are low in salt, eat baked foods instead of fried foods, eat fruit for snacks and desserts  (11/07/2009)     Activity: take a 30 minute walk every day  (11/07/2009)    Diabetes self-management support: Education handout, Written self-care plan  (11/07/2009)   Diabetes care plan printed   Diabetes education handout printed   Last diabetes self-management training by diabetes educator: 07/25/2007   Last medical nutrition therapy: 03/08/2007    Hypertension self-management support: Education handout, Written self-care plan  (11/07/2009)   Hypertension self-care plan printed.   Hypertension education handout printed    Lipid self-management support: Education handout, Written self-care plan  (11/07/2009)   Lipid self-care plan printed.   Lipid education handout printed   Nursing Instructions: Give Flu vaccine today   Process Orders Check Orders Results:     [facility]: Order checked:     Melida Quitter MD NOT AUTHORIZED TO ORDER Tests Sent for requisitioning (November 07, 2009 2:31 PM):     11/07/2009: [facility] -- T-Basic Metabolic Panel [phone] (signed)     11/07/2009: [facility] -- T-TSH [phone] (signed)     Laboratory Results   Blood Tests   Date/Time Received: November 07, 2009 9:34 AM  Date/Time Reported: Alric Quan  November 07, 2009 9:34 AM   HGBA1C: 7.1%   (Normal Range: Non-Diabetic - 3-6%   Control  Diabetic - 6-8%) CBG Fasting:: 147mg /dL       Influenza  Vaccine    Vaccine Type: Fluvax MCR    Site: right deltoid    Mfr: GlaxoSmithKline    Dose: 0.5 ml    Route: IM    Given by: Chinita Pester RN    Exp. Date: 09/06/2010    Lot #: [id number]    VIS given: 10/01/09 version given November 07, 2009.   Appended Document: REASSIGNED NEW TO DR/CFB Tricor Rx called to [facility].

## 2010-04-08 NOTE — Medication Information (Signed)
Summary: rov/sp   Anticoagulant Therapy  Managed by: Reina Fuse, PharmD Referring MD: Rollene Rotunda MD PCP: Melida Quitter MD Supervising MD: Jens Som MD, Arlys John Indication 1: Atrial Fibrillation (ICD-427.31) Lab Used: [facility]  Site: [facility] INR POC 2.9 INR RANGE 2-3  Dietary changes: no    Health status changes: no    Bleeding/hemorrhagic complications: no    Recent/future hospitalizations: no    Any changes in medication regimen? no    Recent/future dental: no  Any missed doses?: no       Is patient compliant with meds? yes       Allergies: 1)  ! Gemfibrozil (Gemfibrozil) 2)  ! Penicillin 3)  ! Codeine 4)  ! * Latex  Anticoagulation Management History:      The patient is taking warfarin and comes in today for a routine follow up visit.  Positive risk factors for bleeding include history of CVA/TIA and presence of serious comorbidities.  Negative risk factors for bleeding include an age less than 10 years old.  The bleeding index is 'intermediate risk'.  Positive CHADS2 values include History of HTN, History of Diabetes, and Prior Stroke/CVA/TIA.  Negative CHADS2 values include Age > 14 years old.  The start date was 07/22/2004.  Her last INR was 1.6.  Anticoagulation responsible provider: Jens Som MD, Arlys John.  INR POC: 2.9.  Cuvette Lot#: [id number].  Exp: 01/2011.    Anticoagulation Management Assessment/Plan:      The patient's current anticoagulation dose is Warfarin sodium 5 mg tabs: Take as directed..  The target INR is 2 - 3.  The next INR is due 02/14/2010.  Anticoagulation instructions were given to patient.  Results were reviewed/authorized by Reina Fuse, PharmD.  She was notified by Reina Fuse PharmD.         Prior Anticoagulation Instructions: INR 3.0  Continue taking Coumadin 1 tab (5 mg) on all days except Coumadin 0.5 tab (2.5 mg) on Tuesdays and Saturdays.  Return to clinic in 4 weeks.   Current Anticoagulation Instructions: INR 2.9  Continue  taking Coumadin 1 tab (5 mg) on all days except for Coumadin 0.5 tab (2.5 mg) on Tuesdays and Saturdays.  Return to clinic in 4 weeks.

## 2010-04-08 NOTE — Medication Information (Signed)
Summary: rov/eac   Anticoagulant Therapy  Managed by: Weston Brass, PharmD Referring MD: Rollene Rotunda MD PCP: Melida Quitter MD Supervising MD: Shirlee Latch MD, Lisette Mancebo Indication 1: Atrial Fibrillation (ICD-427.31) Lab Used: [facility] [place] Site: [facility] INR POC 2.1 INR RANGE 2-3  Dietary changes: no    Health status changes: no    Bleeding/hemorrhagic complications: yes       Details: has had four nose bleeds since beginning of September  Recent/future hospitalizations: no    Any changes in medication regimen? no    Recent/future dental: no  Any missed doses?: no       Is patient compliant with meds? yes       Allergies: 1)  ! Gemfibrozil (Gemfibrozil) 2)  ! Penicillin 3)  ! Codeine 4)  ! * Latex  Anticoagulation Management History:      The patient is taking warfarin and comes in today for a routine follow up visit.  Positive risk factors for bleeding include history of CVA/TIA and presence of serious comorbidities.  Negative risk factors for bleeding include an age less than 49 years old.  The bleeding index is 'intermediate risk'.  Positive CHADS2 values include History of HTN, History of Diabetes, and Prior Stroke/CVA/TIA.  Negative CHADS2 values include Age > 17 years old.  The start date was 07/22/2004.  Her last INR was 1.6.  Anticoagulation responsible provider: Shirlee Latch MD, [name].  INR POC: 2.1.  Cuvette Lot#: [id number].  Exp: 01/2011.    Anticoagulation Management Assessment/Plan:      The patient's current anticoagulation dose is Warfarin sodium 5 mg tabs: Take as directed..  The target INR is 2 - 3.  The next INR is due 12/19/2009.  Anticoagulation instructions were given to patient.  Results were reviewed/authorized by Weston Brass, PharmD.  She was notified by [name].         Prior Anticoagulation Instructions: INR 3.3  Do NOT take coumadin.  Then start NEW dosing schedule of 1/2 tablet on Tuesday and Saturday, and 1 tablet all other days.  Return to clinic in  2 weeks.    Current Anticoagulation Instructions: INR 2.1  Continue taking one tablet every day except for one-half tablet on Tuesday and Saturday.  Recheck in three weeks.

## 2010-04-08 NOTE — Progress Notes (Signed)
Summary: med refill/gp  Phone Note Refill Request Message from:  Patient on March 13, 2009 11:39 AM  Refills Requested: Medication #1:  DIGITEK 0.25 MG TABS Take 1 tablet by mouth once a day  Medication #2:  ATENOLOL 25 MG TABS Take 1 tablet by mouth two times a day  Medication #3:  AMITRIPTYLINE HCL 50 MG  TABS Take 1 tablet by mouth at bedtime  Medication #4:  LEVOTHYROXINE SODIUM 75 MCG TABS Take 1 tablet by mouth once a day            Medication      #5 Lasix 40mg  take 1tablet once a day #31   Method Requested: Electronic Initial call taken by: Chinita Pester RN,  March 13, 2009 11:39 AM    Prescriptions: DIGITEK 0.25 MG TABS (DIGOXIN) Take 1 tablet by mouth once a day  #30 x 6   Entered and Authorized by:   Joaquin Courts  MD   Signed by:   Joaquin Courts  MD on 03/13/2009   Method used:   Electronically to        [facility]* (retail)       [address]       [place], [place]  [address]       Ph: [phone]       Fax: [phone]   RxID:   [id number] AMITRIPTYLINE HCL 50 MG  TABS (AMITRIPTYLINE HCL) Take 1 tablet by mouth at bedtime  #31 x 6   Entered and Authorized by:   Joaquin Courts  MD   Signed by:   Joaquin Courts  MD on 03/13/2009   Method used:   Electronically to        [facility]* (retail)       [address]       [place], [place]  [address]       Ph: [phone]       Fax: [phone]   RxID:   [id number] LASIX 40 MG TABS (FUROSEMIDE) Take 1 tablet by mouth once a day  #31 x 6   Entered and Authorized by:   Joaquin Courts  MD   Signed by:   Joaquin Courts  MD on 03/13/2009   Method used:   Electronically to        [facility]* (retail)       [address]       [place], [place]  [address]       Ph: [phone]       Fax: [phone]   RxID:   [id number] LEVOTHYROXINE SODIUM 75 MCG TABS  (LEVOTHYROXINE SODIUM) Take 1 tablet by mouth once a day  #30 x 6   Entered and Authorized by:   Joaquin Courts  MD   Signed by:   Joaquin Courts  MD on 03/13/2009   Method used:   Electronically to        [facility]* (retail)       [address]       [place], [place]  [address]       Ph: [phone]       Fax: [phone]   RxID:   [id number] ATENOLOL 25 MG TABS (ATENOLOL) Take 1 tablet by mouth two times a day  #  60 x 6   Entered and Authorized by:   Joaquin Courts  MD   Signed by:   Joaquin Courts  MD on 03/13/2009   Method used:   Electronically to        [facility]* (retail)       [address]       [place], [place]  [address]       Ph: [phone]       Fax: [phone]   RxID:   [id number]

## 2010-04-08 NOTE — Assessment & Plan Note (Signed)
Summary: DM TRAINING/VS              Is Patient Diabetic? Yes  CBG Result 227 CBG Device ID [facility] Comments CBg is 3 hours post breakfast of skim milk and special K with strawberries       Current Allergies: ! GEMFIBROZIL (GEMFIBROZIL) ! PENICILLIN ! CODEINE ! Ronny Bacon          ]  Diabetes Self Management Training  PCP:  Peggye Pitt MD Referring MD: Ardyth Harps Date diagnosed with diabetes: 01/07/2006 Diabetes Type: Type 2 non-insulin treated Other persons present: no Current smoking Status: never  Assessment Sources of Support: family & friends Special needs or Barriers: Medicare A & B, but no D  Diabetes Medications:  On Lipid lowering Medication? No On Anti-platelet Medication?     Yes Herbs or Supplements:              Yes Comments: stopped chekcing blood sugar because of cost. was unaware that Medicare B covers testing supplies. Thinks insulin cost will be a challenge for her.     Monitoring Self monitoring blood glucose 2 times a day Measures urine ketones? No Name of Meter  [facility] Wears Medical I.D. No   Carrys Food for Low Blood sugar No   Can you tell if your blood sugar is low? Yes     Estimated /Usual Carb Intake Breakfast # of Carbs/Grams 3=45gm  Nutrition assessment ETOH : No Who does the food shopping? You Who does the cooking?  You What beverages do you drink?  diet drinks  Activity Limitations  Inadequate physical activity  Medications State name-action-dose-duration-side effects-and time to take medication: Demonstrates competency State appropriate timing of food related to medication: Demonstrates competency Demonstrates/verbalizes site selection and rotation for injections Demonstrates competency Correctly draw up and administer insulin-Byetta-Symlin-glucagon: Demonstrates competency State diabetes medication adjustments for sick days: Demonstrates competency State insulin adjustment  guidelines: Not applicable  Describe safe needle/lancet disposal: Regulatory affairs officer purpose and frequency of monitoring BG-ketones-HgbA1C and when to contact health care team with results: Demonstrates competency Perform glucose monitoring/ketone testing and record results correctly: Demonstrates competency State target blood glucose and HgbA1C goals: Demonstrates competency  Complications State the causes- signs and symptoms and prevention of hypoglycemia: Demonstrates competency Explain proper treatment of hypoglycemia: Demonstrates competency State sick day guidelines and when to contact health care team: Needs review/assistance State the relationship between blood glucose control and the development/prevention of long-term complications: Demonstrates competency State benefits-risks-and options for improving blood sugar control: Demonstrates competency State the relationship between blood pressure and lipid control in the prevention/control of cardiovascular disease: Demonstrates competency State the principles of skin-dental and foot care: Demonstrates competency Describe symptoms of skin and foot problems and describe foot exam: Demonstrates competency State when to seek medical advice and treatment: Demonstrates competency  Psychosocial Adjustment Identify how stress affects diabetes & two sources of stress: Needs review/assistance Name two ways of obtaining support from family/friends: Needs review/assistance Diabetes Management Education Done: 07/25/2007    BEHAVIORAL GOALS INITIAL Monitoring blood glucose levels daily: purchase strips & test blood glucose at least twice a day and bring back to see doctor, consider testign 6-7 times on one day(including 3 am)  to get a good idea of blood sugar patterns        Note improved lipids, but weight and blood glucose stable and too high.    Uses [facility] meter- Her CBg today was 227 mg/dl.  Discussed  fears related  to insulin  iniitation and past experiences with her father. Discussed options for affordable inulin inititation. Needs Rx  testing supplies as well. Patient able to demonstrate self injection and verbalized understanding of use, care and storage of insulin.   Follow-up: as needed for weight loss/lifestyle changes when she is ready to work on it or goals not aligned with outcomes. Diabetes Self managment support: office appointment & family & friends    Last LDL:                                                 105 (06/16/2007 8:41:00 PM)       Appended Document: DM TRAINING/VS needs Rx for testing supplies   Clinical Lists Changes  Medications: Rx of LANCETS   MISC (LANCETS) to test blood sugar twice daily;  #100 x 7;  Signed;  Entered by: Jamison Neighbor RD,CDE;  Authorized by: Peggye Pitt MD;  Method used: Electronic Rx of RELION BLOOD GLUCOSE TEST   STRP (GLUCOSE BLOOD) to test blood sugar twice daily;  #100 x 7;  Signed;  Entered by: Jamison Neighbor RD,CDE;  Authorized by: Peggye Pitt MD;  Method used: Electronic    Prescriptions: RELION BLOOD GLUCOSE TEST   STRP (GLUCOSE BLOOD) to test blood sugar twice daily  #100 x 7   Entered by:   Jamison Neighbor RD,CDE   Authorized by:   Peggye Pitt MD   Signed by:   Peggye Pitt MD on 07/26/2007   Method used:   [facility] ...       [facility]  [address]       [id number]       [place], [place]  [address]       Ph: [phone]       Fax: [phone]   RxID:   [id number] LANCETS   MISC (LANCETS) to test blood sugar twice daily  #100 x 7   Entered by:   Jamison Neighbor RD,CDE   Authorized by:   Peggye Pitt MD   Signed by:   Peggye Pitt MD on 07/26/2007   Method used:   [facility] ...       [facility]  [address]       [place], [place]  [address]       Ph: [phone]       Fax: [phone]   RxID:    [id number]

## 2010-04-08 NOTE — Assessment & Plan Note (Signed)
Summary: dsmt/dmr              CBG Device ID [facility]     Current Allergies: ! GEMFIBROZIL (GEMFIBROZIL) ! PENICILLIN ! CODEINE ! Ronny Bacon           ]  Diabetes Self Management Training  PCP:  Peggye Pitt MD Referring MD: Ardyth Harps Date diagnosed with diabetes: 01/07/2006 Diabetes Type: Type 2 non-insulin treated Current smoking Status: never  Assessment Work Hours: Not currently working Sources of Support: 2 dogs, family stresses her  Special needs or Barriers: cONSTANT PAIN- ESPECIALLY BACK EVEN WHILE SITTING OR STANDING OR LYING DOWN.  Potential Barriers  Economic/Supplies  Coping Skills  Coping with Diabetes Feelings about Diabetes: Acceptance  Diabetes Medications:  On Lipid lowering Medication? No On Anti-platelet Medication?     Yes Herbs or Supplements:              Yes Comments: 2000 mg [place] fish oil/day, MVI   Currently using Insulin Pump? No  Monitoring Self monitoring blood glucose 1 time a day Measures urine ketones? No Name of Meter  [facility] Wears Medical I.D. No   Carrys Food for Low Blood sugar No   Can you tell if your blood sugar is low? Yes  Time of Testing  Before Breakfast  Before Lunch  Before Dinner  Recent Episodes of: DKA: No Hypoglycemia: No HHNK: No Severe Hypoglycemia : No  Other Assessment Had an amputation due to diabetes? No Ever had a foot ulcer or infection?           No Performs daily self-foot exams: Yes     Estimated /Usual Carb Intake Breakfast # of Carbs/Grams 3=45gm Lunch # of Carbs/Grams 3=45gm Dinner # of Carbs/Grams 3=45gm Bedtime # of Carbs/Grams 5=75gm Other # of Carbs/Grams 5=75gm Fat: Excessive fat intake Salt: excessive sodium intake  Nutrition assessment Weight change: used to be small until separated from husband. then went to weight watchers a few years backa nd lost 50 # and has regained it all ETOH : No Do you read food labels? If  so what do you look at?  YES carbs. verbalizes unserstanding but doesn't use knowledge. Biggest challenge to eating healthy: Eating too much    BEHAVIORAL GOALS INITIAL Incorporating physical activity into lifestyle: walk to [place] ( 2 blocks) 2x/week for next 4 weeks Monitoring blood glucose levels daily: checks CBG once daily and record i log book in proper column, circle any out of range         her priorities were:  1- blood sugar control ( a1C 7.6%) 2-weight loss ( BMI-38.4- obese) 3-eat lunch daily(eats 2 meals daily then snacks often in PM often overeating)  Discussed past weight history and loss attempts. On Overeaters Anonymous screen, she answered positively on many questions indicating a problem with complusive overeating in PM especially. Currently pain prohibits much activity. Last LDL:                                                 See Comment mg/dL (16/12/9602 5:40:98 PM)      F/Up in 4 weeks

## 2010-04-08 NOTE — Assessment & Plan Note (Signed)
Summary: FU URI/NEUROPATHY/VS   Vital Signs:  Patient Profile:   60 Years Old Female Weight:      212.3 pounds Temp:     97.1 degrees F oral Pulse rate:   80 / minute BP sitting:   124 / 76  Pt. in pain?   yes    Location:   back    Intensity:   5    Type:       chronic  Vitals Entered By: Kimberly Kidney Ditzler RN (April 05, 2006 10:30 AM)              Is Patient Diabetic? Yes  Nutritional Status Normal Nutritional Status Detail ok CBG Result 99  Have you ever been in a relationship where you felt threatened, hurt or afraid?denies   Does patient need assistance? Functional Status Self care Ambulation Normal Comments A1C - 5.6      PCP:  Kimberly Pitt, MD  Chief Complaint:  F/U on cough- better and still has gray productive cough 03/25/06 right breast surgery- Dr [name].  History of Present Illness: [name] is a 60-yo women with a h/o DM2, chronic a-fib, HTN, hypothyroidism, and peripheral neuropathy, who presents today for reevaluation of a URI and bronchitis.  She was last seen by me on 03/17/06, at which time she was treated conservatively with loratidine.  Today, she is feeling better but still having a slight cough (productive with gray mucus).  It seems to be worse at night, but occurs during the day as well.  She notes some improvement with loratadine.  She denies fevers, chills, hemoptysis, chest pain, and abdominal pain.  The pt endorses intermittent nausea, which occurs a few times a week and is not related to eating or any specific activity.  It resolves spontaneously after a few minutes.  The pt underwent breast surgery 1 week ago, which went well without complications.  By her report, she was found to have a benign mass in her right breast.  Of note, [name] is still having burning in right foot.  She seems to notice this most often in the evenings. The sensation is described as a needle-like sensation.  She was previously diagnosed with diabetic neuropathy  and has been taking amitriptyline 25 mg at bedtime, though the dose was to be increased to 50 mg in 10/07.  Finally, pt is complaining of diffuse pruritis following her recent breast surgery and is wondering if this could be a result of the anesthesia.  Prior Medications (reviewed today): METFORMIN HCL 1000 MG TABS (METFORMIN HCL) Take 1 tablet by mouth two times a day GLIPIZIDE 5 MG TABS (GLIPIZIDE) Take 1 tablet by mouth two times a day DILTIAZEM HCL CR 180 MG CP24 (DILTIAZEM HCL) Take 1 tablet by mouth once a day DARVOCET-N 100 100-650 MG TABS (PROPOXYPHENE N-APAP) Take 1 tablet by mouth every 4 hours as needed for pain WARFARIN SODIUM 5 MG TABS (WARFARIN SODIUM) Take 1 tablet by mouth once a day LEXAPRO 10 MG TABS (ESCITALOPRAM OXALATE) Take 1 tablet by mouth once a day ATENOLOL 25 MG TABS (ATENOLOL) Take 1 tablet by mouth two times a day OMEPRAZOLE 20 MG CPDR (OMEPRAZOLE) Take 1 tablet by mouth once a day CALCARB 600/D 600-125 MG-UNIT TABS (CALCIUM-VITAMIN D) Take 1 tablet by mouth two times a day MULTIVITAMINS TABS (MULTIPLE VITAMIN) Take 1 tablet by mouth once a day ZOCOR 10 MG TABS (SIMVASTATIN) Take 1 tablet by mouth once a day at bedtime LEVOTHYROXINE SODIUM 75 MCG  TABS (LEVOTHYROXINE SODIUM) Take 1 tablet by mouth once a day LASIX 40 MG TABS (FUROSEMIDE) Take 1 tablet by mouth once a day KLOR-CON 10 10 MEQ TBCR (POTASSIUM CHLORIDE) Take 1 tablet by mouth two times a day DIGITEK 0.25 MG TABS (DIGOXIN) Take 1 tablet by mouth once a day Current Allergies (reviewed today): ! GEMFIBROZIL (GEMFIBROZIL) ! PENICILLIN ! CODEINE  Past Medical History:    Atrial fibrillation    Diabetes mellitus, type II    Diverticulosis, colon    Hypertension    Hypothyroidism    Osteoarthritis    Esophageal stricture, hx of    Hiatal hernia, hx of, s/p    Cholecystitis, hx of, s/p cholecystectomy    Obstructive sleep apnea    Sarcoidosis    Diabetic neuropathy    Atypical chest pain, hx of     Benign right breast mass, hx of  Past Surgical History:    Hiatal hernia repair    Cholecystectomy    Esophageal dilation x 2    Hysterectomy    Excision of milk duct of right breast -> benign (1/08)   Social History:    Never Smoked    Alcohol use-no    Drug use-no    Divorced    On disability   Risk Factors:  Tobacco use:  never Drug use:  no Alcohol use:  no    Physical Exam  General:     NAD. Mouth:     OP clear without PND or other lesions.  Fair dentition. Neck:     Supple without LAD or thyromegaly. Lungs:     Clear to auscultation bilaterally. Heart:     Irregularly irregular without murmurs, rubs, or gallops. Abdomen:     Normal, active bowel sounds.  Abdomen is soft, non-tender, and non-distended. Pulses:     2+ radial, PT and DP pulses bilaterally. Extremities:     No LE edema. Neurologic:     Mildly decreased monofilament sensation in right great toe.  Fine-touch sensation in LE otherwise intact.  3+ biceps, tricepts, patellar and ankle reflexes bilaterally.  5/5 strength in UE and LE bilaterally. Skin:     Widespread erythema secondary to excoriation on the pt's chest and arms.  No rash seen on back or legs.    Impression & Recommendations:  Problem # 1:  DIABETES MELLITUS, TYPE II (ICD-250.00) Today, the pt's DM appears to be under very good control, with an A1c of 5.6.  She is not having any side effects from her current regimen of metformin and glipizide, and I have asked her to continue these medications.  Further management will be deferred to Dr. Ardyth [name], the pt's PCP.  Her updated medication list for this problem includes:    Metformin Hcl 1000 Mg Tabs (Metformin hcl) .Marland Kitchen... Take 1 tablet by mouth two times a day    Glipizide 5 Mg Tabs (Glipizide) .Marland Kitchen... Take 1 tablet by mouth two times a day  Orders: T- Capillary Blood Glucose (82948) T-Hgb A1C ([address]UE)  Orders: T- Capillary Blood Glucose (82948) T-Hgb A1C (45409WJ)   Problem  # 2:  PRURITUS (ICD-698.9) Though the pt believes that her pruritis is a result of her anesthesia, I suspect that dermatitis secondary to dry skin is the most likely cause.  Except for areas of scratching, no identifiable rash is noted.  I have recommended that she use a moisturizing lotion to sooth her skin and have provided her with a prescription for hydroxyzine to be used as  needed for severe itching in the next few days.  If her symptoms do not improve over the next 1-2 weeks, she should return to the clinic for further evaluation.  Problem # 3:  DIABETIC PERIPHERAL NEUROPATHY (ICD-250.60) The pt continues to have neuropathic pain, primarily in the evenings and at night.  She initially responded well to amitriptyline but has experience recurrence of her symptoms.  In October, the pt was instructed to increase her nightly amitriptyline dose to 50 mg, but did not do so at that time.  I have provided her with a prescription for 50 mg tablets, and have again urged her to use this higher dose.  The pt should return to the clinic in 1 month for reevaluation by her PCP.  Further adjustments may need to be made at that time.  Her updated medication list for this problem includes:    Metformin Hcl 1000 Mg Tabs (Metformin hcl) .Marland Kitchen... Take 1 tablet by mouth two times a day    Glipizide 5 Mg Tabs (Glipizide) .Marland Kitchen... Take 1 tablet by mouth two times a day   Problem # 4:  COUGH (ICD-786.2) Though the pt continues to have a cough, she reports that her symptoms are continuing to improve.  She should use loratadine as previously prescribed for symptomatic relief.  Since she remains afebrile and has noticed improvement in her symptoms, I do not feel that imaging or antibiotic therapy is warranted at this time.  Additionally, she does not complain of any dyspnea or chest pain.  Her symptoms should be readdressed when the pt returns for f/u in 1 month.  Problem # 5:  ATRIAL FIBRILLATION (ICD-427.31) This appears to be  stable.  The pt is to continue to f/u with [facility] cardiology for management of her anticoagulation and a-fib medications.  Her updated medication list for this problem includes:    Warfarin Sodium 5 Mg Tabs (Warfarin sodium) .Marland Kitchen... Take 1 tablet by mouth once a day    Atenolol 25 Mg Tabs (Atenolol) .Marland Kitchen... Take 1 tablet by mouth two times a day    Digitek 0.25 Mg Tabs (Digoxin) .Marland Kitchen... Take 1 tablet by mouth once a day   Medications Added to Medication List This Visit: 1)  Amitriptyline Hcl 50 Mg Tabs (Amitriptyline hcl) .... Take 1 tablet by mouth once a day at bedtime 2)  Hydroxyzine Hcl 50 Mg Tabs (Hydroxyzine hcl) .... Take 1 tablet by mouth every 6 hours as needed for severe itching.   Patient Instructions: 1)  Please schedule a follow-up appointment in 1 month to be seen by Dr. Ardyth [name]. 2)  Continue your current diabetes medicines. 3)  Increase amitriptyline to 50 mg every night (you were given a new prescription today) 4)  Use a moisturizing lotion for your dry and itchy skin.  You may also use hydroxyzine 50 mg every 6 hours as needed for severe itching.  This medicine can make you drowsy, so do not use it if you plan to drive or operate machinery. 5)  Continue to follow-up with [facility] cardiology for management of your Coumadin and atrial fibrillation.  Laboratory Results       Blood Tests Glucose (random): 99 mg/dL   (Normal Range: 16-109) HGBA1C: 5.6%   (Normal Range: Non-Diabetic - 3-6%   Control Diabetic - 6-8%)   Other Tests

## 2010-04-08 NOTE — Letter (Signed)
Summary: EGD Instructions  [facility]  [address] [place], [place] [address]   Phone: [phone]  Fax: [phone]       [name]    [birth year]    MRN: [id number]       Procedure Day Dorna Bloom: Wednesday 11/20/2009     Arrival Time: 9:30am     Procedure Time: 10:30am     Location of Procedure:                    [facility] (4th Floor)   PREPARATION FOR ENDOSCOPY   On 11/20/2009 THE DAY OF THE PROCEDURE:  1.   No solid foods, milk or milk products are allowed after midnight the night before your procedure.  2.   Do not drink anything colored red or purple.  Avoid juices with pulp.  No orange juice.  3.  You may drink clear liquids until 8:30am, which is 2 hours before your procedure.                                                                                                CLEAR LIQUIDS INCLUDE: Water Jello Ice Popsicles Tea (sugar ok, no milk/cream) Powdered fruit flavored drinks Coffee (sugar ok, no milk/cream) Gatorade Juice: apple, white grape, white cranberry  Lemonade Clear bullion, consomm, broth Carbonated beverages (any kind) Strained chicken noodle soup Hard Candy   MEDICATION INSTRUCTIONS  Unless otherwise instructed, you should take regular prescription medications with a small sip of water as early as possible the morning of your procedure.  Diabetic patients - see separate instructions.    Stay on your Coumadin Per Dr. Jarold Motto            OTHER INSTRUCTIONS  You will need a responsible adult at least 62 years of age to accompany you and drive you home.   This person must remain in the waiting room during your procedure.  Wear loose fitting clothing that is easily removed.  Leave jewelry and other valuables at home.  However, you may wish to bring a book to read or an iPod/MP3 player to listen to music as you wait for your procedure to start.  Remove all body piercing jewelry and leave at  home.  Total time from sign-in until discharge is approximately 2-3 hours.  You should go home directly after your procedure and rest.  You can resume normal activities the day after your procedure.  The day of your procedure you should not:   Drive   Make legal decisions   Operate machinery   Drink alcohol   Return to work  You will receive specific instructions about eating, activities and medications before you leave.    The above instructions have been reviewed and explained to me by   _______________________    I fully understand and can verbalize these instructions _____________________________ Date _________

## 2010-04-08 NOTE — Medication Information (Signed)
Summary: rov/sp  Anticoagulant Therapy  Managed by: Eda Keys, PharmD Referring MD: Rollene Rotunda MD PCP: Joaquin Courts  MD Supervising MD: Daleen Squibb MD, Maisie Fus Indication 1: Atrial Fibrillation (ICD-427.31) Lab Used: [facility] Site: [facility] INR POC 2.5 INR RANGE 2-3  Dietary changes: no    Health status changes: no    Bleeding/hemorrhagic complications: yes       Details: 1 nose bleed, stopped in  ~15 min  Recent/future hospitalizations: no    Any changes in medication regimen? no    Recent/future dental: no  Any missed doses?: yes     Details: 1 tab  ~ 1 wk ago  Is patient compliant with meds? yes       Current Medications (verified): 1)  Metformin Hcl 1000 Mg Tabs (Metformin Hcl) .... Take 1 Tablet By Mouth Two Times A Day 2)  Glipizide 10 Mg  Tabs (Glipizide) .... One  Tab Two Times A Day 3)  Diltiazem Hcl Cr 180 Mg Cp24 (Diltiazem Hcl) .... Take 1 Tablet By Mouth Once A Day 4)  Darvocet-N 100 100-650 Mg Tabs (Propoxyphene N-Apap) .... Take 1 Tablet By Mouth Every 4 Hours As Needed For Pain 5)  Warfarin Sodium 5 Mg Tabs (Warfarin Sodium) .... Take As Directed. 6)  Lexapro 10 Mg Tabs (Escitalopram Oxalate) .... Take 1 Tablet By Mouth Once A Day 7)  Atenolol 25 Mg Tabs (Atenolol) .... Take 1 Tablet By Mouth Two Times A Day 8)  Omeprazole 20 Mg Cpdr (Omeprazole) .... Take 1 Tablet By Mouth Once A Day 9)  Calcium 600/vitamin D 600-400 Mg-Unit Tabs (Calcium Carbonate-Vitamin D) .... Take 1 Tablet By Mouth Two Times A Day 10)  Multivitamins Tabs (Multiple Vitamin) .... Take 1 Tablet By Mouth Once A Day 11)  Levothyroxine Sodium 75 Mcg Tabs (Levothyroxine Sodium) .... Take 1 Tablet By Mouth Once A Day 12)  Lasix 40 Mg Tabs (Furosemide) .... Take 1 Tablet By Mouth Once A Day 13)  Klor-Con 10 10 Meq Tbcr (Potassium Chloride) .... Take 1 Tablet By Mouth Two Times A Day 14)  Digitek 0.25 Mg Tabs (Digoxin) .... Take 1 Tablet By Mouth Once A Day 15)  Amitriptyline Hcl  50 Mg  Tabs (Amitriptyline Hcl) .... Take 1 Tablet By Mouth At Bedtime 16)  Fish Oil 1000 Mg Caps (Omega-3 Fatty Acids) .... Take 1 Tablet By Mouth Twice A Day 17)  Relion Blood Glucose Test   Strp (Glucose Blood) .... To Test Blood Sugar Twice Daily 18)  Lancets   Misc (Lancets) .... To Test Blood Sugar Twice Daily 19)  Lisinopril 10 Mg  Tabs (Lisinopril) .... Take 1 Tablet By Mouth Once A Day 20)  Pravachol 20 Mg Tabs (Pravastatin Sodium) .... Take 1/2 Tab By Mouth Before Bedtime and Increase To A Full Tab At Bedtime After One Week. 21)  Anusol-Hc 25 Mg  Supp (Hydrocortisone Acetate) .... Insert One Supp Into Rectum Once A Day 22)  Tylenol Extra Strength 500 Mg Tabs (Acetaminophen) .... Take 1 Tablet By Mouth Every 6 Hours As Needed For Pain  Allergies (verified): 1)  ! Gemfibrozil (Gemfibrozil) 2)  ! Penicillin 3)  ! Codeine 4)  ! * Latex  Anticoagulation Management History:      The patient is taking warfarin and comes in today for a routine follow up visit.  Positive risk factors for bleeding include history of CVA/TIA and presence of serious comorbidities.  Negative risk factors for bleeding include an age less than 76 years old.  The bleeding index is 'intermediate risk'.  Positive CHADS2 values include History of HTN, History of Diabetes, and Prior Stroke/CVA/TIA.  Negative CHADS2 values include Age > 20 years old.  The start date was 07/22/2004.  Her last INR was 1.6.  Anticoagulation responsible provider: Daleen Squibb MD, Maisie Fus.  INR POC: 2.5.  Cuvette Lot#: [id number].  Exp: 01/2010.    Anticoagulation Management Assessment/Plan:      The patient's current anticoagulation dose is Warfarin sodium 5 mg tabs: Take as directed..  The target INR is 2 - 3.  The next INR is due 01/15/2009.  Anticoagulation instructions were given to patient.  Results were reviewed/authorized by Eda Keys, PharmD.  She was notified by Eda Keys.         Prior Anticoagulation Instructions: INR  2.1  Continue same dose of 1 tablet everyday except 1/2 tablet on Saturday   Current Anticoagulation Instructions: INR 2.5  Continue taking 1 tablet (5 mg) daily, except for 1/2 tablet (2.5 mg) on Saturday.  Return to clinic in 4 weeks.

## 2010-04-08 NOTE — Assessment & Plan Note (Signed)
Summary: FU/VS    Vital Signs:  Patient Profile:   60 Years Old Female Height:     63 inches (160.02 cm) Weight:      214.5 pounds Temp:     97.4 degrees F oral Pulse rate:   90 / minute BP sitting:   120 / 80  (right arm)  Vitals Entered By: Filomena Jungling (March 08, 2007 2:36 PM)             Is Patient Diabetic? Yes   Does patient need assistance? Functional Status Self care Ambulation Normal     PCP:  Peggye Pitt, MD  Chief Complaint:  follow-up.  History of Present Illness: She is here today for f/u on her CBGs after we had increased her glucotrol to 10 mg two times a day. Her CBGs are fantastic from 78-140 with an occasional 200 value. On another note, her previous CMET showed some mild transaminitis and she is also here to discuss this.  Current Allergies: ! GEMFIBROZIL (GEMFIBROZIL) ! PENICILLIN ! CODEINE ! * LATEX    Risk Factors: Tobacco use:  never Drug use:  no Alcohol use:  no   Review of Systems  The patient denies fever, weight loss, chest pain, dyspnea on exhertion, abdominal pain, and severe indigestion/heartburn.     Physical Exam  General:     Well-developed,well-nourished,in no acute distress; alert,appropriate and cooperative throughout examination Neck:     No bruits/goiter Lungs:     Normal respiratory effort, chest expands symmetrically. Lungs are clear to auscultation, no crackles or wheezes. Heart:     normal rate, no murmur, no gallop, no rub, and irregular rhythm.      Impression & Recommendations:  Problem # 1:  TRANSAMINASES, SERUM, ELEVATED (ICD-790.4) Have ordered a CMET to recheck. If remain elevated, will start investigation with a hepatitis panel and a RUQ abdominal ultrasound.  Problem # 2:  DIABETES MELLITUS, TYPE II (ICD-250.00) Much improvement in her CBGs. Foot exam and eye exam next visit.  Her updated medication list for this problem includes:    Metformin Hcl 1000 Mg Tabs (Metformin hcl)  .Marland Kitchen... Take 1 tablet by mouth two times a day    Glipizide 10 Mg Tabs (Glipizide) ..... 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 Derrell Lolling 2/2 nipple bleeding; pathology was benign per patient. Will try to get records today.   Problem # 4:  SARCOIDOSIS (ICD-135)  Stable. No SOB. Off steroids. Dr. Shan Levans (pulmonary) following. PFTs essentially normal with only some  mild obstruction.   Problem # 5:  HYPOTHYROIDISM (ICD-244.9) On stable dose of synthroid. Recheck TSH in 08/09.  Her updated medication list for this problem includes:    Levothyroxine Sodium 75 Mcg Tabs (Levothyroxine sodium) .Marland Kitchen... Take 1 tablet by mouth once a day    Problem # 6:  ATRIAL FIBRILLATION (ICD-427.31) Stable. On coumadin. Followed by [facility].  Her updated medication list for this problem includes:    Warfarin Sodium 5 Mg Tabs (Warfarin sodium) .Marland Kitchen... Take 1 tablet by mouth once a day    Atenolol 25 Mg Tabs (Atenolol) .Marland Kitchen... Take 1 tablet by mouth two times a day    Digitek 0.25 Mg Tabs (Digoxin) .Marland Kitchen... Take 1 tablet by mouth once a day  Problem # 7:  HYPERTENSION (ICD-401.9) Excellent BP. Continue current regimen.  Her updated medication list for this problem includes:    Diltiazem Hcl Cr 180 Mg Cp24 (Diltiazem hcl) .Marland Kitchen... Take 1 tablet by mouth once a day    Atenolol 25 Mg Tabs (Atenolol) .Marland Kitchen... Take 1 tablet by mouth two times a day    Lasix 40 Mg Tabs (Furosemide) .Marland Kitchen... Take 1 tablet by mouth once a day  BP today: 120/80 Prior BP: 134/75 (01/27/2007)  Labs Reviewed: Creat: 0.66 (01/28/2007) Chol: 237 (01/28/2007)   HDL: 34 (01/28/2007)   LDL: See Comment mg/dL (16/12/9602)   TG: 540 (01/28/2007)   Problem # 8:  HEALTH MAINTENANCE EXAM (ICD-V70.0)  Has had complete hysterectomy. (no more PAP  smears). Will get DEXA scan as she is 25 years post-menopausal. FLP ordered. Mammograms up to date. Per patient had colonoscopy 3 years ago....will obtain result.   Complete Medication List: 1)  Metformin Hcl 1000 Mg Tabs (Metformin hcl) .... Take 1 tablet by mouth two times a day 2)  Glipizide 10 Mg Tabs (Glipizide) .... One  tab two times a day 3)  Diltiazem Hcl Cr 180 Mg Cp24 (Diltiazem hcl) .... Take 1 tablet by mouth once a day 4)  Darvocet-n 100 100-650 Mg Tabs (Propoxyphene n-apap) .... Take 1 tablet by mouth every 4 hours as needed for pain 5)  Warfarin Sodium 5 Mg Tabs (Warfarin sodium) .... Take 1 tablet by mouth once a day 6)  Lexapro 10 Mg Tabs (Escitalopram oxalate) .... Take 1 tablet by mouth once a day 7)  Atenolol 25 Mg Tabs (Atenolol) .... Take 1 tablet by mouth two times a day 8)  Omeprazole 20 Mg Cpdr (Omeprazole) .... Take 1 tablet by mouth once a day 9)  Calcarb 600/d 600-125 Mg-unit Tabs (Calcium-vitamin d) .... Take 1 tablet by mouth two times a day 10)  Multivitamins Tabs (Multiple vitamin) .... Take 1 tablet by mouth once a day 11)  Levothyroxine Sodium 75 Mcg Tabs (Levothyroxine sodium) .... Take 1 tablet by mouth once a day 12)  Lasix 40 Mg Tabs (Furosemide) .... Take 1 tablet by mouth once a day 13)  Klor-con 10 10 Meq Tbcr (Potassium chloride) .... Take 1 tablet by mouth two times a day 14)  Digitek 0.25 Mg Tabs (Digoxin) .... Take 1 tablet by mouth once a day 15)  Amitriptyline Hcl 50 Mg Tabs (Amitriptyline hcl) .... Take 1 tablet by mouth at bedtime 16)  Fish Oil 1000 Mg Caps (Omega-3 fatty acids) .... Take 1 tablet by mouth once a day 17)  Flexeril 5 Mg Tabs (Cyclobenzaprine hcl) .... Take 1 tab by mouth at bedtime for 5 days   Patient Instructions: 1)  Please schedule a follow-up appointment in 3 months. 2)  We will call you with the results of your liver tests.    Prescriptions: GLIPIZIDE 10 MG  TABS (GLIPIZIDE) one  tab two times a day  #60 x 6    Entered and Authorized by:   Peggye Pitt MD   Signed by:   Peggye Pitt MD on 03/08/2007   Method used:   Print then Give to Patient   RxID:   [id number]  ]  Vital Signs:  Patient Profile:   60 Years Old Female Height:     63 inches (160.02 cm) Weight:      214.5 pounds Temp:     97.4 degrees F oral Pulse rate:   90 / minute BP sitting:   120 /  80                

## 2010-04-08 NOTE — Progress Notes (Signed)
Summary: Pending dental procedure  Phone Note Call from Patient   Caller: Patient Summary of Call: Pt. calls states she need to have a tooth that's broken off to the gum extracted and also one that is abcessed that needs to be removed by Dr. Lillia Abed # [phone] on 10/09/08. That doctor is wanting [place] to decided on the time she needs to be off coumadin. Her phone # [phone] and cell [phone].  Initial call taken by: Bethena Midget, RN, BSN,  September 27, 2008 4:41 PM  Follow-up for Phone Call        OK to stop coumadin 5 days before tooth extraction.  Resume afterward.  Fax this not to Coumadin clinic and have them schedule follow up.

## 2010-04-08 NOTE — Procedures (Signed)
Summary: GI-Dr. Corinda Gubler  GI-Dr. [facility]   Imported By: Dorice Lamas 07/07/2006 11:14:04  _____________________________________________________________________  External Attachment:    Type:   Image     Comment:   External Document

## 2010-04-08 NOTE — Miscellaneous (Signed)
Summary: HIPAA Restrictions  HIPAA Restrictions   Imported By: Florinda Marker 12/15/2007 12:36:17  _____________________________________________________________________  External Attachment:    Type:   Image     Comment:   External Document

## 2010-04-08 NOTE — Medication Information (Signed)
Summary: rov/ewj  Anticoagulant Therapy  Managed by: Weston Brass, PharmD Referring MD: Rollene Rotunda MD PCP: None Supervising MD: Antoine Poche MD, Fayrene Fearing Indication 1: Atrial Fibrillation (ICD-427.31) Lab Used: [facility] Site: [facility] INR POC 2.1 INR RANGE 2-3  Dietary changes: no    Health status changes: yes       Details: 1 epsiode of chest tightness- resolved after laying down  Bleeding/hemorrhagic complications: yes       Details: 1 nose bleed after blowing nose   Recent/future hospitalizations: no    Any changes in medication regimen? no    Recent/future dental: no  Any missed doses?: no       Is patient compliant with meds? yes       Allergies: 1)  ! Gemfibrozil (Gemfibrozil) 2)  ! Penicillin 3)  ! Codeine 4)  ! * Latex  Anticoagulation Management History:      The patient is taking warfarin and comes in today for a routine follow up visit.  Positive risk factors for bleeding include history of CVA/TIA and presence of serious comorbidities.  Negative risk factors for bleeding include an age less than 23 years old.  The bleeding index is 'intermediate risk'.  Positive CHADS2 values include History of HTN, History of Diabetes, and Prior Stroke/CVA/TIA.  Negative CHADS2 values include Age > 74 years old.  The start date was 07/22/2004.  Her last INR was 1.6.  Anticoagulation responsible provider: Antoine Poche MD, Fayrene Fearing.  INR POC: 2.1.  Cuvette Lot#: [id number].  Exp: 01/2010.    Anticoagulation Management Assessment/Plan:      The patient's current anticoagulation dose is Warfarin sodium 5 mg tabs: Take as directed..  The target INR is 2 - 3.  The next INR is due 12/18/2008.  Anticoagulation instructions were given to patient.  Results were reviewed/authorized by Weston Brass, PharmD.  She was notified by Weston Brass PharmD.         Prior Anticoagulation Instructions: INR 3.7  Skip today's dose of coumadin.  Then resume 1 tablet daily except 1/2 tablet on Saturdays.  Recheck  in 3 weeks.    Current Anticoagulation Instructions: INR 2.1  Continue same dose of 1 tablet everyday except 1/2 tablet on Saturday

## 2010-04-08 NOTE — Miscellaneous (Signed)
Summary: Orders Update  Clinical Lists Changes  Orders: Added new Test order of Carotid Duplex (Carotid Duplex) - Signed 

## 2010-04-08 NOTE — Progress Notes (Signed)
Summary: Refill request/dms  Phone Note Refill Request Message from:  Fax from Pharmacy on August 17, 2006 10:17 AM  Refills Requested: Medication #1:  METFORMIN HCL 1000 MG TABS Take 1 tablet by mouth two times a day   Last Refilled: 07/10/2006  Method Requested: Fax to Local Pharmacy Initial call taken by: Henderson Cloud,  August 17, 2006 10:18 AM  Follow-up for Phone Call        Refill approved-nurse to complete Follow-up by: Margarito Liner MD,  August 17, 2006 10:31 AM  Additional Follow-up for Phone Call Additional follow up Details #1::        Rx faxed to pharmacy Additional Follow-up by: Henderson Cloud,  August 17, 2006 10:36 AM    Prescriptions: METFORMIN HCL 1000 MG TABS (METFORMIN HCL) Take 1 tablet by mouth two times a day  #60 x 2   Entered and Authorized by:   Margarito Liner MD   Signed by:   Margarito Liner MD on 08/17/2006   Method used:   Telephoned to ...       [facility]       [address]       [place], [place]  [address]       Ph: [phone]       Fax: [phone]   RxID:   [id number]

## 2010-04-08 NOTE — Medication Information (Signed)
Summary: rovm.p  Anticoagulant Therapy  Managed by: Shelby Dubin, PharmD, BCPS, CPP Referring MD: Rollene Rotunda MD PCP: Joaquin Courts  MD Supervising MD: Antoine Poche MD, Fayrene Fearing Indication 1: Atrial Fibrillation (ICD-427.31) Lab Used: [facility] 18.4 INR POC 2.3 INR RANGE 2-3  Dietary changes: no    Health status changes: no    Bleeding/hemorrhagic complications: no    Recent/future hospitalizations: yes       Details: pending colon and esophageal stretch on 8/16 with Dr. Jarold Motto  Any changes in medication regimen? no    Recent/future dental: no  Any missed doses?: no       Is patient compliant with meds? yes       Allergies (verified): 1)  ! Gemfibrozil (Gemfibrozil) 2)  ! Penicillin 3)  ! Codeine 4)  ! * Latex  Anticoagulation Management History:      The patient is taking warfarin and comes in today for a routine follow up visit.  Positive risk factors for bleeding include history of CVA/TIA and presence of serious comorbidities.  Negative risk factors for bleeding include an age less than 41 years old.  The bleeding index is 'intermediate risk'.  Positive CHADS2 values include History of HTN, History of Diabetes, and Prior Stroke/CVA/TIA.  Negative CHADS2 values include Age > 58 years old.  The start date was 07/22/2004.  Her last INR was 1.6.  Prothrombin time is 18.4.  Anticoagulation responsible provider: Antoine Poche MD, Fayrene Fearing.  INR POC: 2.3.  Cuvette Lot#: [id number] .  Exp: 09/2009.    Anticoagulation Management Assessment/Plan:      The patient's current anticoagulation dose is Warfarin sodium 5 mg tabs: Take as directed..  The target INR is 2 - 3.  The next INR is due 09/20/2008.  Anticoagulation instructions were given to patient.  Results were reviewed/authorized by Shelby Dubin, PharmD, BCPS, CPP.  She was notified by Shelby Dubin PharmD, BCPS, CPP.         Prior Anticoagulation Instructions: INR 2.4  The patient is to continue with the same dose of coumadin.  This dosage  includes: 5mg  everyday with 2.5mg  on Saturday  Dr. Ulyses Amor)- Friday July 5th at 9:00am  Dr. Antoine Poche (Heart)- Thursday July 15th at 4:30pm   Current Anticoagulation Instructions: INR 2.3 today.  Continue 5 mg daily except on 2.5 mg Saturdays.

## 2010-04-08 NOTE — Medication Information (Signed)
Summary: rov/tm  Anticoagulant Therapy  Managed by: Cloyde Reams, RN, BSN Referring MD: Rollene Rotunda MD PCP: Joaquin Courts  MD Supervising MD: Gala Romney MD, Reuel Boom Indication 1: Atrial Fibrillation (ICD-427.31) Lab Used: [facility] Site: [facility] INR POC 3.4 INR RANGE 2-3  Dietary changes: no    Health status changes: no    Bleeding/hemorrhagic complications: no    Recent/future hospitalizations: no    Any changes in medication regimen? yes       Details: Took abx and pain med after surgery.   Recent/future dental: no  Any missed doses?: yes     Details: Off Coumadin 5 days prior to foot surgery on 09/18/09.   Is patient compliant with meds? yes       Allergies: 1)  ! Gemfibrozil (Gemfibrozil) 2)  ! Penicillin 3)  ! Codeine 4)  ! * Latex  Anticoagulation Management History:      The patient is taking warfarin and comes in today for a routine follow up visit.  Positive risk factors for bleeding include history of CVA/TIA and presence of serious comorbidities.  Negative risk factors for bleeding include an age less than 63 years old.  The bleeding index is 'intermediate risk'.  Positive CHADS2 values include History of HTN, History of Diabetes, and Prior Stroke/CVA/TIA.  Negative CHADS2 values include Age > 68 years old.  The start date was 07/22/2004.  Her last INR was 1.6.  Anticoagulation responsible provider: Bensimhon MD, Reuel Boom.  INR POC: 3.4.  Cuvette Lot#: [id number].  Exp: 12/2010.    Anticoagulation Management Assessment/Plan:      The patient's current anticoagulation dose is Warfarin sodium 5 mg tabs: Take as directed..  The target INR is 2 - 3.  The next INR is due 10/18/2009.  Anticoagulation instructions were given to patient.  Results were reviewed/authorized by Cloyde Reams, RN, BSN.  She was notified by Cloyde Reams RN.         Prior Anticoagulation Instructions: INR 2.7 Continue 5mg s everyday except 2.5mg s on Saturdays. Recheck in 4 weeks.   Tentativly take last dose on 09/12/09.   Current Anticoagulation Instructions: INR 3.4  Skip today's dosage of coumadin, then resume same dosage 1 tablet daily except 1/2 tablet on Saturdays.  Recheck in 3 weeks.

## 2010-04-08 NOTE — Assessment & Plan Note (Signed)
Summary: F/U/EST/VS   Vital Signs:  Patient profile:   60 year old female Menstrual status:  postmenopausal Height:      63 inches (160.02 cm) Weight:      208.3 pounds (94.68 kg) BMI:     37.03 Temp:     97.1 degrees F (36.17 degrees C) oral Pulse rate:   76 / minute BP sitting:   122 / 72  (left arm) Cuff size:   regular  Vitals Entered By: Cynda Familia Duncan Dull) (Jul 22, 2009 3:19 PM) Is Patient Diabetic? Yes Did you bring your meter with you today? No  Have you ever been in a relationship where you felt threatened, hurt or afraid?No   Does patient need assistance? Functional Status Self care Ambulation Normal   Diabetic Foot Exam Last Podiatry Exam Date: 07/15/2009     Primary Care Provider:  Joaquin Courts  MD   History of Present Illness: Pt is a 60 yo female w/ past med hx below here for f/u.  She had surgery on her R foot on 04/28 and notes it seems to be doing well.  She has continued to try to lose weight and has lost 4 lbs.    She has a knot on her bottom she would like me to look at.  It is not painful, it is not draining, she has no other symptoms.  She is excited b/c she has a cruise planned to [place] in July.    Preventive Screening-Counseling & Management  Alcohol-Tobacco     Alcohol drinks/day: 0     Smoking Status: never  Current Medications (verified): 1)  Metformin Hcl 1000 Mg Tabs (Metformin Hcl) .... Take 1 Tablet By Mouth Two Times A Day 2)  Glipizide 10 Mg  Tabs (Glipizide) .... Take 1 Tablet By Mouth Two Times A Day 3)  Diltiazem Hcl Cr 180 Mg Cp24 (Diltiazem Hcl) .... Take 1 Tablet By Mouth Once A Day 4)  Warfarin Sodium 5 Mg Tabs (Warfarin Sodium) .... Take As Directed. 5)  Atenolol 25 Mg Tabs (Atenolol) .... Take 1 Tablet By Mouth Two Times A Day 6)  Omeprazole 20 Mg Cpdr (Omeprazole) .... Take 1 Tablet By Mouth Once A Day Prn 7)  Calcium 600/vitamin D 600-400 Mg-Unit Tabs (Calcium Carbonate-Vitamin D) .... Take 1 Tablet By  Mouth Two Times A Day 8)  Multivitamins Tabs (Multiple Vitamin) .... Take 1 Tablet By Mouth Once A Day 9)  Levothyroxine Sodium 75 Mcg Tabs (Levothyroxine Sodium) .... Take 1 Tablet By Mouth Once A Day 10)  Lasix 40 Mg Tabs (Furosemide) .... Take 1 Tablet By Mouth Once A Day 11)  Klor-Con 10 10 Meq Tbcr (Potassium Chloride) .... Take 1 Tablet By Mouth Two Times A Day 12)  Digitek 0.25 Mg Tabs (Digoxin) .... Take 1 Tablet By Mouth Once A Day 13)  Fish Oil 1000 Mg Caps (Omega-3 Fatty Acids) .... Take 1 Tablet By Mouth Twice A Day 14)  Relion Blood Glucose Test   Strp (Glucose Blood) .... To Test Blood Sugar Twice Daily 15)  Lancets   Misc (Lancets) .... To Test Blood Sugar Twice Daily 16)  Lisinopril 10 Mg  Tabs (Lisinopril) .... Take 1 Tablet By Mouth Once A Day 17)  Pravachol 40 Mg Tabs (Pravastatin Sodium) .... Two Tablets By Mouth Once Daily. 18)  Prozac 20 Mg Caps (Fluoxetine Hcl) .... Take 1 Tablet By Mouth Every Morning 19)  Flexeril 5 Mg Tabs (Cyclobenzaprine Hcl) .... Take 1 Tab By Mouth  Every 12 Hours As Needed For Pain. 20)  Niacin 500 Mg Tabs (Niacin) .... Two Tabs By Mouth Twice A Day.  Allergies: 1)  ! Gemfibrozil (Gemfibrozil) 2)  ! Penicillin 3)  ! Codeine 4)  ! * Latex  Past History:  Past Medical History: Last updated: 11/02/2008 Atrial fibrillation Diabetes mellitus, type II Diverticulosis, colon Hypertension Hypothyroidism Osteoarthritis Esophageal stricture, hx of Hiatal hernia, hx of, s/p Cholecystitis, hx of, s/p cholecystectomy Obstructive sleep apnea Sarcoidosis Diabetic neuropathy Atypical chest pain, hx of Benign right breast mass, hx of (papilloma) Diabetic Neuropathy Fibromyalgia  Past Surgical History: Last updated: 11/02/2008 Hiatal hernia repair Cholecystectomy Esophageal dilation x 2 Hysterectomy Excision of milk duct of right breast -> benign (1/08) Hernia Surgery(femoral)  Social History: Last updated: 11/02/2008 Never  Smoked Alcohol use-no Drug use-no Divorced On disability  Social History: Reviewed history from 11/02/2008 and no changes required. Never Smoked Alcohol use-no Drug use-no Divorced On disability  Review of Systems       as per hpi.  Physical Exam  General:  alert, oriented, appropriately groomed, no distress. Eyes:  anicteric. Lungs:  Normal respiratory effort, chest expands symmetrically. Lungs are clear to auscultation, no crackles or wheezes. Heart:  Irreg irreg, reg rate, SEM II/VI at RSB.  Abdomen:  obese, +BS's, soft, NT Genitalia:  2 mm benign appearing mole on the R buttock near the perineal area that is flesh colored. Extremities:  no peripheral edema, wounds on R foot appear to be healing appropriately.  Psych:  mood euthymic.    Impression & Recommendations:  Problem # 1:  BUNION (ICD-727.1) Surgery went well, wounds healing appropriately.  Problem # 2:  DIABETES MELLITUS, TYPE II (ICD-250.00) Well controlled, BP at goal, cont ACE I, aggressively managing cholesterol.  She sees podiatry regularly for her feet.  Will inquire about last eye exam at our next visit.  Her updated medication list for this problem includes:    Metformin Hcl 1000 Mg Tabs (Metformin hcl) .Marland Kitchen... Take 1 tablet by mouth two times a day    Glipizide 10 Mg Tabs (Glipizide) .Marland Kitchen... Take 1 tablet by mouth two times a day    Lisinopril 10 Mg Tabs (Lisinopril) .Marland Kitchen... Take 1 tablet by mouth once a day  Problem # 3:  CAROTID STENOSIS (ICD-433.10) Repeat dopplers showed 40-59% in 09/10.  Will cont w/ aggressive risk factor mgt.  Her updated medication list for this problem includes:    Warfarin Sodium 5 Mg Tabs (Warfarin sodium) .Marland Kitchen... Take as directed.  Problem # 4:  HYPERLIPIDEMIA (ICD-272.4) Will repeat lipids to see if niacin is helping.  Has had problems w/ hypertriglyceridemia in the past.  Her updated medication list for this problem includes:    Pravachol 40 Mg Tabs (Pravastatin sodium)  .Marland Kitchen..Marland Kitchen Two tablets by mouth once daily.    Niacin 500 Mg Tabs (Niacin) .Marland Kitchen..Marland Kitchen Two tabs by mouth twice a day.  Problem # 5:  HYPOTHYROIDISM (ICD-244.9) Will recheck TSH at her next office visit.  Her updated medication list for this problem includes:    Levothyroxine Sodium 75 Mcg Tabs (Levothyroxine sodium) .Marland Kitchen... Take 1 tablet by mouth once a day  Labs Reviewed: TSH: 1.442 (08/24/2008)    HgBA1c: 6.4 (05/09/2009) Chol: 168 (05/09/2009)   HDL: 36 (05/09/2009)   LDL: * mg/dL (30/86/5784)   TG: 696 (05/09/2009)  Problem # 6:  ATRIAL FIBRILLATION (ICD-427.31)  Rate controlled, cont coumadin per [place] cards.   Her updated medication list for this problem includes:  Diltiazem Hcl Cr 180 Mg Cp24 (Diltiazem hcl) .Marland Kitchen... Take 1 tablet by mouth once a day    Warfarin Sodium 5 Mg Tabs (Warfarin sodium) .Marland Kitchen... Take as directed.    Atenolol 25 Mg Tabs (Atenolol) .Marland Kitchen... Take 1 tablet by mouth two times a day    Digitek 0.25 Mg Tabs (Digoxin) .Marland Kitchen... Take 1 tablet by mouth once a day  Reviewed the following: PT: 14.4 (10/15/2008)   INR: 1.6 (03/17/2006) Coumadin Dose (weekly): 32.50 mg (07/12/2009) Prior Coumadin Dose (weekly): 32.50 mg (07/12/2009) Next Protime: 08/09/2009 (dated on 07/12/2009)  Problem # 7:  FIBROMYALGIA, SEVERE (ICD-729.1) Managed w/ flexeril and prozac.  Her updated medication list for this problem includes:    Flexeril 5 Mg Tabs (Cyclobenzaprine hcl) .Marland Kitchen... Take 1 tab by mouth every 12 hours as needed for pain.  Problem # 8:  OBESITY, UNSPECIFIED (ICD-278.00) Discussed weight loss strategies.  Suggested Healthy Lifestyles Class.  Complete Medication List: 1)  Metformin Hcl 1000 Mg Tabs (Metformin hcl) .... Take 1 tablet by mouth two times a day 2)  Glipizide 10 Mg Tabs (Glipizide) .... Take 1 tablet by mouth two times a day 3)  Diltiazem Hcl Cr 180 Mg Cp24 (Diltiazem hcl) .... Take 1 tablet by mouth once a day 4)  Warfarin Sodium 5 Mg Tabs (Warfarin sodium) .... Take as  directed. 5)  Atenolol 25 Mg Tabs (Atenolol) .... Take 1 tablet by mouth two times a day 6)  Omeprazole 20 Mg Cpdr (Omeprazole) .... Take 1 tablet by mouth once a day prn 7)  Calcium 600/vitamin D 600-400 Mg-unit Tabs (Calcium carbonate-vitamin d) .... Take 1 tablet by mouth two times a day 8)  Multivitamins Tabs (Multiple vitamin) .... Take 1 tablet by mouth once a day 9)  Levothyroxine Sodium 75 Mcg Tabs (Levothyroxine sodium) .... Take 1 tablet by mouth once a day 10)  Lasix 40 Mg Tabs (Furosemide) .... Take 1 tablet by mouth once a day 11)  Klor-con 10 10 Meq Tbcr (Potassium chloride) .... Take 1 tablet by mouth two times a day 12)  Digitek 0.25 Mg Tabs (Digoxin) .... Take 1 tablet by mouth once a day 13)  Fish Oil 1000 Mg Caps (Omega-3 fatty acids) .... Take 1 tablet by mouth twice a day 14)  Relion Blood Glucose Test Strp (Glucose blood) .... To test blood sugar twice daily 15)  Lancets Misc (Lancets) .... To test blood sugar twice daily 16)  Lisinopril 10 Mg Tabs (Lisinopril) .... Take 1 tablet by mouth once a day 17)  Pravachol 40 Mg Tabs (Pravastatin sodium) .... Two tablets by mouth once daily. 18)  Prozac 20 Mg Caps (Fluoxetine hcl) .... Take 1 tablet by mouth every morning 19)  Flexeril 5 Mg Tabs (Cyclobenzaprine hcl) .... Take 1 tab by mouth every 12 hours as needed for pain. 20)  Niacin 500 Mg Tabs (Niacin) .... Two tabs by mouth twice a day.  Other Orders: T-Comprehensive Metabolic Panel [id number]) T-Lipid Profile ([id number])  Patient Instructions: 1)  Please make a followup appointment in 3 months. 2)  Please go to [facility] Lifestyles Class. 3)  Goal of 5 lbs at your next office visit in 3 months.  Prescriptions: PROZAC 20 MG CAPS (FLUOXETINE HCL) Take 1 tablet by mouth every morning  #30 x 1   Entered and Authorized by:   Joaquin Courts  MD   Signed by:   Joaquin Courts  MD on 07/22/2009   Method used:   Faxed to .Marland KitchenMarland Kitchen  [facility]* (retail)       [address]       [place], [place]  [address]       Ph: [phone]       Fax: [phone]   RxID:   [id number]   Prevention & Chronic Care Immunizations   Influenza vaccine: Fluvax MCR  (12/04/2008)   Influenza vaccine deferral: Not available  (08/24/2008)   Influenza vaccine due: 11/07/2008    Tetanus booster: Not documented   Td booster deferral: Deferred  (08/24/2008)    Pneumococcal vaccine: Not documented   Pneumococcal vaccine deferral: Deferred  (08/24/2008)    H. zoster vaccine: Not documented   H. zoster vaccine deferral: Deferred  (08/24/2008)  Colorectal Screening   Hemoccult: Not documented   Hemoccult action/deferral: Ordered  (08/24/2008)    Colonoscopy: Location:  [facility].    (10/22/2008)   Colonoscopy action/deferral: Repeat colonoscopy in 7 years. 2012  (08/22/2003)   Colonoscopy due: 05/2010  Other Screening   Pap smear: Not documented   Pap smear action/deferral: Not indicated S/P hysterectomy  (08/24/2008)    Mammogram: ASSESSMENT: Negative - BI-RADS 1^MM DIGITAL SCREENING  (11/19/2008)   Mammogram due: 10/2008    DXA bone density scan:  Lumbar Spine:  T Score > -1.0 Spine.  Hip Total: T Score -2.5 to -1.0 Hip.      (03/20/2008)   DXA scan due: 03/2010    Smoking status: never  (07/22/2009)  Diabetes Mellitus   HgbA1C: 6.4  (05/09/2009)   HgbA1C action/deferral: Ordered  (12/04/2008)    Eye exam: Not documented   Diabetic eye exam action/deferral: Deferred  (05/09/2009)    Foot exam: yes  (12/04/2008)   Foot exam action/deferral: Do today   High risk foot: Not documented   Foot care education: Not documented   Foot exam due: 03/13/2009    Urine microalbumin/creatinine ratio: 23.8  (12/15/2007)   Urine microalbumin action/deferral: Not indicated    Diabetes flowsheet reviewed?: Yes   Progress toward A1C goal: At goal  Lipids   Total Cholesterol: 168   (05/09/2009)   Lipid panel action/deferral: Lipid Panel ordered   LDL: * mg/dL  (84/13/2440)   LDL Direct: Not documented   HDL: 36  (05/09/2009)   Triglycerides: 506  (05/09/2009)    SGOT (AST): 29  (05/09/2009)   SGPT (ALT): 37  (05/09/2009) CMP ordered    Alkaline phosphatase: 82  (05/09/2009)   Total bilirubin: 0.6  (05/09/2009)    Lipid flowsheet reviewed?: Yes   Progress toward LDL goal: Unchanged  Hypertension   Last Blood Pressure: 122 / 72  (07/22/2009)   Serum creatinine: 0.71  (05/09/2009)   Serum potassium 4.0  (05/09/2009) CMP ordered     Hypertension flowsheet reviewed?: Yes   Progress toward BP goal: At goal  Self-Management Support :   Personal Goals (by the next clinic visit) :     Personal A1C goal: 7  (12/04/2008)     Personal blood pressure goal: 130/80  (12/04/2008)     Personal LDL goal: 100  (12/04/2008)    Patient will work on the following items until the next clinic visit to reach self-care goals:     Medications and monitoring: take my medicines every day  (07/22/2009)     Eating: drink diet soda or water instead of juice or soda, eat more vegetables, use fresh or frozen vegetables, eat foods that are low in salt,  eat baked foods instead of fried foods, eat fruit for snacks and desserts, limit or avoid alcohol  (05/09/2009)     Activity: take a 30 minute walk every day  (05/09/2009)    Diabetes self-management support: Pre-printed educational material  (07/22/2009)   Last diabetes self-management training by diabetes educator: 07/25/2007   Last medical nutrition therapy: 03/08/2007    Hypertension self-management support: Pre-printed educational material  (07/22/2009)    Lipid self-management support: Pre-printed educational material  (07/22/2009)    Nursing Instructions: Diabetic foot exam today   Process Orders Check Orders Results:     [facility]: Check successful Tests Sent for requisitioning (Jul 24, 2009 7:34 AM):      07/22/2009: [facility] -- T-Comprehensive Metabolic Panel [[id number]] (signed)     07/22/2009: [facility] -- T-Lipid Profile [id number] (signed)

## 2010-04-08 NOTE — Progress Notes (Signed)
Summary: refill/gg  Phone Note Refill Request  on May 29, 2009 3:05 PM  Refills Requested: Medication #1:  KLOR-CON 10 10 MEQ TBCR Take 1 tablet by mouth two times a day   Last Refilled: 05/01/2009  Medication #2:  LISINOPRIL 10 MG  TABS Take 1 tablet by mouth once a day   Last Refilled: 04/29/2009 Must Fax in !!  Initial call taken by: Merrie Roof RN,  May 29, 2009 3:05 PM    Prescriptions: LISINOPRIL 10 MG  TABS (LISINOPRIL) Take 1 tablet by mouth once a day  #30 x 6   Entered and Authorized by:   Joaquin Courts  MD   Signed by:   Joaquin Courts  MD on 05/29/2009   Method used:   Faxed to ...       [facility]* (retail)       [address]       [place], [place]  [address]       Ph: [phone]       Fax: [phone]   RxID:   [id number] KLOR-CON 10 10 MEQ TBCR (POTASSIUM CHLORIDE) Take 1 tablet by mouth two times a day  #62 x 6   Entered and Authorized by:   Joaquin Courts  MD   Signed by:   Joaquin Courts  MD on 05/29/2009   Method used:   Faxed to ...       [facility]* (retail)       [address]       [place], [place]  [address]       Ph: [phone]       Fax: [phone]   RxID:   [id number]

## 2010-04-08 NOTE — Medication Information (Signed)
Summary: rov/jm      Allergies Added:  Anticoagulant Therapy  Managed by: Reina Fuse, PharmD Referring MD: Rollene Rotunda MD PCP: Melida Quitter MD Supervising MD: Riley Kill MD, Maisie Fus Indication 1: Atrial Fibrillation (ICD-427.31) Lab Used: [facility] Site: [facility] INR POC 3.0 INR RANGE 2-3  Dietary changes: no    Health status changes: no    Bleeding/hemorrhagic complications: yes       Details: Pt has been having nosebleeds regularly. Last one was a few weeks ago, but lasted for 1 hr. Encouraged pt to see ENT. Plans to f/u with PCP. F/u at next visit.   Recent/future hospitalizations: no    Any changes in medication regimen? no    Recent/future dental: no  Any missed doses?: no       Is patient compliant with meds? yes       Current Medications (verified): 1)  Metformin Hcl 1000 Mg Tabs (Metformin Hcl) .... Take 1 Tablet By Mouth Two Times A Day 2)  Glipizide 10 Mg  Tabs (Glipizide) .... Take 1 Tablet By Mouth Two Times A Day 3)  Diltiazem Hcl Cr 180 Mg Cp24 (Diltiazem Hcl) .... Take 1 Tablet By Mouth Once A Day 4)  Warfarin Sodium 5 Mg Tabs (Warfarin Sodium) .... Take As Directed. 5)  Atenolol 25 Mg Tabs (Atenolol) .... Take 1 Tablet By Mouth Two Times A Day 6)  Omeprazole 20 Mg Cpdr (Omeprazole) .... Take 1 Tablet By Mouth Once A Day Prn 7)  Calcium 600/vitamin D 600-400 Mg-Unit Tabs (Calcium Carbonate-Vitamin D) .... Take 1 Tablet By Mouth Two Times A Day 8)  Multivitamins Tabs (Multiple Vitamin) .... Take 1 Tablet By Mouth Once A Day 9)  Levothyroxine Sodium 75 Mcg Tabs (Levothyroxine Sodium) .... Take 1 Tablet By Mouth Once A Day 10)  Lasix 40 Mg Tabs (Furosemide) .... Take 1 Tablet By Mouth Once A Day 11)  Klor-Con 10 10 Meq Tbcr (Potassium Chloride) .... Take 1 Tablet By Mouth Two Times A Day 12)  Digitek 0.25 Mg Tabs (Digoxin) .... Take 1 Tablet By Mouth Once A Day 13)  Fish Oil 1000 Mg Caps (Omega-3 Fatty Acids) .... Take 1 Tablet By Mouth Twice A  Day 14)  Relion Blood Glucose Test   Strp (Glucose Blood) .... To Test Blood Sugar Twice Daily 15)  Lancets   Misc (Lancets) .... To Test Blood Sugar Twice Daily 16)  Lisinopril 10 Mg  Tabs (Lisinopril) .... Take 1 Tablet By Mouth Once A Day 17)  Pravachol 40 Mg Tabs (Pravastatin Sodium) .... Two Tablets By Mouth Once Daily. 18)  Prozac 20 Mg Caps (Fluoxetine Hcl) .... Take 1 Tablet By Mouth Every Morning 19)  Flexeril 5 Mg Tabs (Cyclobenzaprine Hcl) .... Take 1 Tab By Mouth Every 12 Hours As Needed For Pain. 20)  Niacin 500 Mg Tabs (Niacin) .... Two Tabs By Mouth Twice A Day. 21)  Amitriptyline Hcl 50 Mg Tabs (Amitriptyline Hcl) .... Take 1 Tab By Mouth At Bedtime 22)  Vicodin 5-500 Mg Tabs (Hydrocodone-Acetaminophen) .Marland Kitchen.. 1 Tab Every 12 Hours As Needed For Pain 23)  Gabapentin 100 Mg Caps (Gabapentin) .... Take 1 Tab By Mouth At Bedtime 24)  Tricor 145 Mg Tabs (Fenofibrate) .... Take 1 Tablet By Mouth Once A Day 25)  Nystatin 100000 Unit/ml Susp (Nystatin) .... Swish and Swallow Four Times A Day  Allergies (verified): 1)  ! Gemfibrozil (Gemfibrozil) 2)  ! Penicillin 3)  ! Codeine 4)  ! * Latex  Anticoagulation Management  History:      The patient is taking warfarin and comes in today for a routine follow up visit.  Positive risk factors for bleeding include history of CVA/TIA and presence of serious comorbidities.  Negative risk factors for bleeding include an age less than 44 years old.  The bleeding index is 'intermediate risk'.  Positive CHADS2 values include History of HTN, History of Diabetes, and Prior Stroke/CVA/TIA.  Negative CHADS2 values include Age > 47 years old.  The start date was 07/22/2004.  Her last INR was 1.6.  Anticoagulation responsible provider: Riley Kill MD, Maisie Fus.  INR POC: 3.0.  Cuvette Lot#: [id number].  Exp: 01/2011.    Anticoagulation Management Assessment/Plan:      The patient's current anticoagulation dose is Warfarin sodium 5 mg tabs: Take as directed..  The  target INR is 2 - 3.  The next INR is due 01/17/2010.  Anticoagulation instructions were given to patient.  Results were reviewed/authorized by Reina Fuse, PharmD.  She was notified by Reina Fuse PharmD.         Prior Anticoagulation Instructions: INR 2.1  Continue taking one tablet every day except for one-half tablet on Tuesday and Saturday.  Recheck in three weeks.      Current Anticoagulation Instructions: INR 3.0  Continue taking Coumadin 1 tab (5 mg) on all days except Coumadin 0.5 tab (2.5 mg) on Tuesdays and Saturdays.  Return to clinic in 4 weeks.

## 2010-04-08 NOTE — Assessment & Plan Note (Signed)
Summary: CHECKUP/ SB.   Vital Signs:  Patient Profile:   60 Years Old Female Height:     63 inches (160.02 cm) Weight:      213.03 pounds (96.83 kg) BMI:     37.87 Temp:     97.1 degrees F (36.17 degrees C) oral Pulse rate:   89 / minute BP sitting:   140 / 73  (right arm)  Pt. in pain?   yes    Location:   back, lower abdomen    Intensity:   9    Type:       aching  Vitals Entered By: Angelina Ok RN (June 01, 2007 2:42 PM)              Is Patient Diabetic? Yes  Nutritional Status BMI of > 30 = obese CBG Result 116  Have you ever been in a relationship where you felt threatened, hurt or afraid?No   Does patient need assistance? Functional Status Self care Ambulation Normal     PCP:  Peggye Pitt, MD  Chief Complaint:  Episode on Sunday where lips got numb and she felt funny.  History of Present Illness: Last Sunday had an episode at church of numbness around the mouth and slurred speech that lasted for about 15-20 min. She did not seek medical attention. Admits to being under a lot of stress recently. Today has no acute complaints. This is a scheduled followup for her.    Prior Medication List:  METFORMIN HCL 1000 MG TABS (METFORMIN HCL) Take 1 tablet by mouth two times a day GLIPIZIDE 10 MG  TABS (GLIPIZIDE) one  tab two times a day DILTIAZEM HCL CR 180 MG CP24 (DILTIAZEM HCL) Take 1 tablet by mouth once a day DARVOCET-N 100 100-650 MG TABS (PROPOXYPHENE N-APAP) Take 1 tablet by mouth every 4 hours as needed for pain WARFARIN SODIUM 5 MG TABS (WARFARIN SODIUM) Take 1 tablet by mouth once a day LEXAPRO 10 MG TABS (ESCITALOPRAM OXALATE) Take 1 tablet by mouth once a day ATENOLOL 25 MG TABS (ATENOLOL) Take 1 tablet by mouth two times a day OMEPRAZOLE 20 MG CPDR (OMEPRAZOLE) Take 1 tablet by mouth once a day CALCARB 600/D 600-125 MG-UNIT TABS (CALCIUM-VITAMIN D) Take 1 tablet by mouth two times a day MULTIVITAMINS TABS (MULTIPLE VITAMIN) Take 1 tablet by mouth  once a day LEVOTHYROXINE SODIUM 75 MCG TABS (LEVOTHYROXINE SODIUM) Take 1 tablet by mouth once a day LASIX 40 MG TABS (FUROSEMIDE) Take 1 tablet by mouth once a day KLOR-CON 10 10 MEQ TBCR (POTASSIUM CHLORIDE) Take 1 tablet by mouth two times a day DIGITEK 0.25 MG TABS (DIGOXIN) Take 1 tablet by mouth once a day AMITRIPTYLINE HCL 50 MG  TABS (AMITRIPTYLINE HCL) Take 1 tablet by mouth at bedtime FISH OIL 1000 MG CAPS (OMEGA-3 FATTY ACIDS) Take 1 tablet by mouth once a day FLEXERIL 5 MG  TABS (CYCLOBENZAPRINE HCL) Take 1 tab by mouth at bedtime for 5 days RELION BLOOD GLUCOSE TEST   STRP (GLUCOSE BLOOD) to test blood sugar twice daily LANCETS   MISC (LANCETS) to test blood sugar twice daily   Current Allergies: ! GEMFIBROZIL (GEMFIBROZIL) ! PENICILLIN ! CODEINE ! * LATEX    Risk Factors: Tobacco use:  never Drug use:  no Alcohol use:  no  c  Review of Systems  The patient denies fever, weight loss, chest pain, dyspnea on exhertion, abdominal pain, melena, hematochezia, and severe indigestion/heartburn.     Physical Exam  General:  Well-developed,well-nourished,in no acute distress; alert,appropriate and cooperative throughout examination Neck:     No bruits/goiter Lungs:     Normal respiratory effort, chest expands symmetrically. Lungs are clear to auscultation, no crackles or wheezes. Heart:     normal rate, no murmur, no gallop, no rub, no JVD, and irregular rhythm.   Abdomen:     Bowel sounds positive,abdomen soft and non-tender without masses, organomegaly or hernias noted. Extremities:     No clubbing, cyanosis, edema.    Impression & Recommendations:  Problem # 1:  TIA (ICD-435.9) This seems the most likely diagnosis to explain her episode of numbness and slurred speech. She does have ALL the vascular risk factors in addition to aFib that predisposes her to thrombi formation. I will start the stroke workup including 2D Echo, carotid dopplers. Will also check  an FLP for further risk stratification. She is asked to come in to the ED immediately if these symptoms occur.  Her updated medication list for this problem includes:    Warfarin Sodium 5 Mg Tabs (Warfarin sodium) .Marland Kitchen... Take 1 tablet by mouth once a day  Orders: 2 D Echo (2 D Echo) Carotid Doppler (Carotid doppler)   Problem # 2:  HYPERTRIGLYCERIDEMIA (ICD-272.1) FLP ordered.  Future Orders: T-Lipid Profile [id number]) ... 06/03/2007   Problem # 3:  TRANSAMINASES, SERUM, ELEVATED (ICD-790.4) CMET today.  Orders: T-Comprehensive Metabolic Panel [id number])  Future Orders: T-Lipid Profile ([id number]) ... 06/03/2007   Problem # 4:  DIABETES MELLITUS, TYPE II (ICD-250.00) Average control, although she could do better. Have asked her to bring in her meter next visit so we can make appropriate changes.  Her updated medication list for this problem includes:    Metformin Hcl 1000 Mg Tabs (Metformin hcl) .Marland Kitchen... Take 1 tablet by mouth two times a day    Glipizide 10 Mg Tabs (Glipizide) ..... One  tab two times a day    Lisinopril 10 Mg Tabs (Lisinopril) .Marland Kitchen... Take 1 tablet by mouth once a day  Orders: T- Capillary Blood Glucose ([id number]) T-Hgb A1C (in-house) (96295MW)  Labs Reviewed: HgBA1c: 7.6 (06/01/2007)   Creat: 0.64 (03/08/2007)      Problem # 5:  HYPOTHYROIDISM (ICD-244.9) On synthroid. Check TSH.  Her updated medication list for this problem includes:    Levothyroxine Sodium 75 Mcg Tabs (Levothyroxine sodium) .Marland Kitchen... Take 1 tablet by mouth once a day  Orders: T-TSH ([id number])   Problem # 6:  ATRIAL FIBRILLATION (ICD-427.31) On coumadin, followed by LB cards.  Her updated medication list for this problem includes:    Warfarin Sodium 5 Mg Tabs (Warfarin sodium) .Marland Kitchen... Take 1 tablet by mouth once a day    Atenolol 25 Mg Tabs (Atenolol) .Marland Kitchen... Take 1 tablet by mouth two times a day    Digitek 0.25 Mg Tabs (Digoxin) .Marland Kitchen... Take 1 tablet by mouth once a  day   Problem # 7:  HYPERTENSION (ICD-401.9) BP above goal today for a diabetic. Given her BP is elevated and she is a diabetic, will start lisinopril 10 mg. BMET today to monitor renal function and electrolytes.  Her updated medication list for this problem includes:    Diltiazem Hcl Cr 180 Mg Cp24 (Diltiazem hcl) .Marland Kitchen... Take 1 tablet by mouth once a day    Atenolol 25 Mg Tabs (Atenolol) .Marland Kitchen... Take 1 tablet by mouth two times a day    Lasix 40 Mg Tabs (Furosemide) .Marland Kitchen... Take 1 tablet by mouth once a day    Lisinopril 10 Mg Tabs (  Lisinopril) .Marland Kitchen... Take 1 tablet by mouth once a day  Future Orders: T-Basic Metabolic Panel [id number]) ... 06/15/2007  BP today: 140/73 Prior BP: 120/80 (03/08/2007)  Labs Reviewed: Creat: 0.64 (03/08/2007) Chol: 237 (01/28/2007)   HDL: 34 (01/28/2007)   LDL: See Comment mg/dL (09/81/1914)   TG: 782 (01/28/2007)   Problem # 8:  HEALTH MAINTENANCE EXAM (ICD-V70.0) Has had complete hysterectomy. (no more PAP smears). Will get DEXA scan as she is 25 years post-menopausal. FLP ordered. Mammograms up to date. Per patient had colonoscopy 3 years ago....will obtain result.   Complete Medication List: 1)  Metformin Hcl 1000 Mg Tabs (Metformin hcl) .... Take 1 tablet by mouth two times a day 2)  Glipizide 10 Mg Tabs (Glipizide) .... One  tab two times a day 3)  Diltiazem Hcl Cr 180 Mg Cp24 (Diltiazem hcl) .... Take 1 tablet by mouth once a day 4)  Darvocet-n 100 100-650 Mg Tabs (Propoxyphene n-apap) .... Take 1 tablet by mouth every 4 hours as needed for pain 5)  Warfarin Sodium 5 Mg Tabs (Warfarin sodium) .... Take 1 tablet by mouth once a day 6)  Lexapro 10 Mg Tabs (Escitalopram oxalate) .... Take 1 tablet by mouth once a day 7)  Atenolol 25 Mg Tabs (Atenolol) .... Take 1 tablet by mouth two times a day 8)  Omeprazole 20 Mg Cpdr (Omeprazole) .... Take 1 tablet by mouth once a day 9)  Calcarb 600/d 600-125 Mg-unit Tabs (Calcium-vitamin d) .... Take 1 tablet  by mouth two times a day 10)  Multivitamins Tabs (Multiple vitamin) .... Take 1 tablet by mouth once a day 11)  Levothyroxine Sodium 75 Mcg Tabs (Levothyroxine sodium) .... Take 1 tablet by mouth once a day 12)  Lasix 40 Mg Tabs (Furosemide) .... Take 1 tablet by mouth once a day 13)  Klor-con 10 10 Meq Tbcr (Potassium chloride) .... Take 1 tablet by mouth two times a day 14)  Digitek 0.25 Mg Tabs (Digoxin) .... Take 1 tablet by mouth once a day 15)  Amitriptyline Hcl 50 Mg Tabs (Amitriptyline hcl) .... Take 1 tablet by mouth at bedtime 16)  Fish Oil 1000 Mg Caps (Omega-3 fatty acids) .... Take 1 tablet by mouth once a day 17)  Flexeril 5 Mg Tabs (Cyclobenzaprine hcl) .... Take 1 tab by mouth at bedtime for 5 days 18)  Relion Blood Glucose Test Strp (Glucose blood) .... To test blood sugar twice daily 19)  Lancets Misc (Lancets) .... To test blood sugar twice daily 20)  Lisinopril 10 Mg Tabs (Lisinopril) .... Take 1 tablet by mouth once a day   Patient Instructions: 1)  Please schedule a follow-up appointment in 1 month. 2)  Next time you have an episode like the one you had on Sunday you need to come in for immediate evaluation!! 3)  We will start lisinopril 10 mg daily for your blood pressure and to help your kidneys.    Prescriptions: LISINOPRIL 10 MG  TABS (LISINOPRIL) Take 1 tablet by mouth once a day  #30 x 3   Entered and Authorized by:   Peggye Pitt MD   Signed by:   Peggye Pitt MD on 06/01/2007   Method used:   Print then Give to Patient   RxID:   [id number] DARVOCET-N 100 100-650 MG TABS (PROPOXYPHENE N-APAP) Take 1 tablet by mouth every 4 hours as needed for pain  #180 x 0   Entered and Authorized by:   Peggye Pitt MD   Signed  by:   Peggye Pitt MD on 06/01/2007   Method used:   Print then Give to Patient   RxID:   [id number]  ] Laboratory Results   Blood Tests   Date/Time Recieved: June 01, 2007 3:25 PM Date/Time Reported:  ..................................................................Marland KitchenMisk Dorothy  June 01, 2007 3:25 PM  HGBA1C: 7.6%   (Normal Range: Non-Diabetic - 3-6%   Control Diabetic - 6-8%) CBG Random: 116

## 2010-04-08 NOTE — Progress Notes (Signed)
Summary: refill/ hla  Phone Note Refill Request Message from:  Fax from Pharmacy on February 04, 2010 6:02 PM  Refills Requested: Medication #1:  GLIPIZIDE 10 MG  TABS Take 1 tablet by mouth two times a day   Dosage confirmed as above?Dosage Confirmed   Last Refilled: 11/18 last visit 11/2009  Initial call taken by: Marin Roberts RN,  February 04, 2010 6:03 PM  Follow-up for Phone Call        Refill approved-nurse to complete Follow-up by: Melida Quitter MD,  February 05, 2010 9:57 AM    Prescriptions: GLIPIZIDE 10 MG  TABS (GLIPIZIDE) Take 1 tablet by mouth two times a day  #60 x 6   Entered and Authorized by:   Melida Quitter MD   Signed by:   Melida Quitter MD on 02/05/2010   Method used:   Electronically to        [facility]* (retail)       [address]       [place], [place]  [address]       Ph: [phone]       Fax: [phone]   RxID:   [id number]

## 2010-04-08 NOTE — Assessment & Plan Note (Signed)
Summary: FLU/ SB.  Nurse Visit    Prior Medications: METFORMIN HCL 1000 MG TABS (METFORMIN HCL) Take 1 tablet by mouth two times a day GLIPIZIDE 10 MG  TABS (GLIPIZIDE) one half tab two times a day DILTIAZEM HCL CR 180 MG CP24 (DILTIAZEM HCL) Take 1 tablet by mouth once a day DARVOCET-N 100 100-650 MG TABS (PROPOXYPHENE N-APAP) Take 1 tablet by mouth every 4 hours as needed for pain WARFARIN SODIUM 5 MG TABS (WARFARIN SODIUM) Take 1 tablet by mouth once a day LEXAPRO 10 MG TABS (ESCITALOPRAM OXALATE) Take 1 tablet by mouth once a day ATENOLOL 25 MG TABS (ATENOLOL) Take 1 tablet by mouth two times a day OMEPRAZOLE 20 MG CPDR (OMEPRAZOLE) Take 1 tablet by mouth once a day CALCARB 600/D 600-125 MG-UNIT TABS (CALCIUM-VITAMIN D) Take 1 tablet by mouth two times a day MULTIVITAMINS TABS (MULTIPLE VITAMIN) Take 1 tablet by mouth once a day LEVOTHYROXINE SODIUM 75 MCG TABS (LEVOTHYROXINE SODIUM) Take 1 tablet by mouth once a day LASIX 40 MG TABS (FUROSEMIDE) Take 1 tablet by mouth once a day KLOR-CON 10 10 MEQ TBCR (POTASSIUM CHLORIDE) Take 1 tablet by mouth two times a day DIGITEK 0.25 MG TABS (DIGOXIN) Take 1 tablet by mouth once a day AMITRIPTYLINE HCL 50 MG  TABS (AMITRIPTYLINE HCL) Take 1 tablet by mouth at bedtime FISH OIL 1000 MG CAPS (OMEGA-3 FATTY ACIDS) Take 1 tablet by mouth once a day Current Allergies: ! GEMFIBROZIL (GEMFIBROZIL) ! PENICILLIN ! CODEINE ! * LATEX   Influenza Vaccine    Vaccine Type: Fluvax MCR    Site: left deltoid    Mfr: [employer]    Dose: 0.5 ml    Route: IM    Given by: Angelina Ok RN    Exp. Date: 09/06/2007    Lot #: [id number]    VIS given: 09/05/04 version given December 27, 2006.  Flu Vaccine Consent Questions    Do you have a history of severe allergic reactions to this vaccine? no    Any prior history of allergic reactions to egg and/or gelatin? no    Do you have a sensitivity to the preservative Thimersol? no    Do you have a past history of  Guillan-Barre Syndrome? no    Do you currently have an acute febrile illness? no    Have you ever had a severe reaction to latex? no    Vaccine information given and explained to patient? no    Are you currently pregnant? no   Orders Added: 1)  Influenza Vaccine MCR [00025]    ]

## 2010-04-08 NOTE — Assessment & Plan Note (Signed)
Summary: est-routine checkup/ch   Vital Signs:  Patient profile:   60 year old female Menstrual status:  postmenopausal Height:      63 inches (160.02 cm) Weight:      204.38 pounds (92.90 kg) BMI:     36.34 Temp:     97.1 degrees F (36.17 degrees C) oral Pulse rate:   84 / minute BP sitting:   111 / 65  (right arm)  Vitals Entered By: Starleen Arms CMA (January 01, 2009 10:44 AM) CC: hoarsness, left hand swollen bc of biposy, Depression Is Patient Diabetic? Yes  Pain Assessment Patient in pain? yes     Location: lower back Intensity: 8 Type: aching Nutritional Status BMI of > 30 = obese Nutritional Status Detail nl  Does patient need assistance? Functional Status Self care Ambulation Normal     Menstrual Status postmenopausal   Primary Care Provider:  Joaquin Courts  MD  CC:  hoarsness, left hand swollen bc of biposy, and Depression.  History of Present Illness: Pt is a 60 yo female w/ extensive past medical history below here for f/u of skin lesions removed by dermatology a week ago and for f/u pain in her feet.  She has brought pictures of her grandson who is turning 1 yo next month to share!  She notes the darvocet doesn't seem to help much for pain in her feet and she hasn't tried anything else.  Pain is located on the MTP joints medially on both feet and aggrevated by walking.  Depression History:      The patient is having a depressed mood most of the day and has a diminished interest in her usual daily activities.        The patient denies that she feels like life is not worth living, denies that she wishes that she were dead, and denies that she has thought about ending her life.        Comments:  feels very anxious.  Current Medications (verified): 1)  Metformin Hcl 1000 Mg Tabs (Metformin Hcl) .... Take 1 Tablet By Mouth Two Times A Day 2)  Glipizide 10 Mg  Tabs (Glipizide) .... One  Tab Two Times A Day 3)  Diltiazem Hcl Cr 180 Mg Cp24 (Diltiazem Hcl)  .... Take 1 Tablet By Mouth Once A Day 4)  Darvocet-N 100 100-650 Mg Tabs (Propoxyphene N-Apap) .... Take 1 Tablet By Mouth Every 4 Hours As Needed For Pain 5)  Warfarin Sodium 5 Mg Tabs (Warfarin Sodium) .... Take As Directed. 6)  Lexapro 10 Mg Tabs (Escitalopram Oxalate) .... Take 1 Tablet By Mouth Once A Day 7)  Atenolol 25 Mg Tabs (Atenolol) .... Take 1 Tablet By Mouth Two Times A Day 8)  Omeprazole 20 Mg Cpdr (Omeprazole) .... Take 1 Tablet By Mouth Once A Day 9)  Calcium 600/vitamin D 600-400 Mg-Unit Tabs (Calcium Carbonate-Vitamin D) .... Take 1 Tablet By Mouth Two Times A Day 10)  Multivitamins Tabs (Multiple Vitamin) .... Take 1 Tablet By Mouth Once A Day 11)  Levothyroxine Sodium 75 Mcg Tabs (Levothyroxine Sodium) .... Take 1 Tablet By Mouth Once A Day 12)  Lasix 40 Mg Tabs (Furosemide) .... Take 1 Tablet By Mouth Once A Day 13)  Klor-Con 10 10 Meq Tbcr (Potassium Chloride) .... Take 1 Tablet By Mouth Two Times A Day 14)  Digitek 0.25 Mg Tabs (Digoxin) .... Take 1 Tablet By Mouth Once A Day 15)  Amitriptyline Hcl 50 Mg  Tabs (Amitriptyline Hcl) .... Take 1  Tablet By Mouth At Bedtime 16)  Fish Oil 1000 Mg Caps (Omega-3 Fatty Acids) .... Take 1 Tablet By Mouth Twice A Day 17)  Relion Blood Glucose Test   Strp (Glucose Blood) .... To Test Blood Sugar Twice Daily 18)  Lancets   Misc (Lancets) .... To Test Blood Sugar Twice Daily 19)  Lisinopril 10 Mg  Tabs (Lisinopril) .... Take 1 Tablet By Mouth Once A Day 20)  Pravachol 20 Mg Tabs (Pravastatin Sodium) .... Take 1/2 Tab By Mouth Before Bedtime and Increase To A Full Tab At Bedtime After One Week. 21)  Anusol-Hc 25 Mg  Supp (Hydrocortisone Acetate) .... Insert One Supp Into Rectum Once A Day 22)  Tylenol Extra Strength 500 Mg Tabs (Acetaminophen) .... Take 1 Tablet By Mouth Every 6 Hours As Needed For Pain  Allergies (verified): 1)  ! Gemfibrozil (Gemfibrozil) 2)  ! Penicillin 3)  ! Codeine 4)  ! * Latex  Past History:  Past  Medical History: Last updated: 11/02/2008 Atrial fibrillation Diabetes mellitus, type II Diverticulosis, colon Hypertension Hypothyroidism Osteoarthritis Esophageal stricture, hx of Hiatal hernia, hx of, s/p Cholecystitis, hx of, s/p cholecystectomy Obstructive sleep apnea Sarcoidosis Diabetic neuropathy Atypical chest pain, hx of Benign right breast mass, hx of (papilloma) Diabetic Neuropathy Fibromyalgia  Past Surgical History: Last updated: 11/02/2008 Hiatal hernia repair Cholecystectomy Esophageal dilation x 2 Hysterectomy Excision of milk duct of right breast -> benign (1/08) Hernia Surgery(femoral)  Social History: Last updated: 11/02/2008 Never Smoked Alcohol use-no Drug use-no Divorced On disability  Risk Factors: Smoking Status: never (12/04/2008)  Family History: Reviewed history from 11/02/2008 and no changes required. Maternal uncle w/ dementia.   Melenoma: Father Family History of Colitis/Crohn's:Mother Family History of Diabetes: Father & Sister Family History of Heart Disease: Father  Social History: Reviewed history from 11/02/2008 and no changes required. Never Smoked Alcohol use-no Drug use-no Divorced On disability  Review of Systems       As per HPI.  Physical Exam  General:  alert, appropriate dress, and healthy-appearing.   Eyes:  anicteric Mouth:  MMM Lungs:  normal respiratory effort and normal breath sounds.   Heart:  normal rate, irregular rhythm, SEM II/VI without rub and gallop.   Abdomen:  +BS's, soft, NT and ND. Extremities:  No peripheral edema.  Bunions present on bilateral MTP joints slightly erythematous. Skin:  1 cm lesion on L shin and dorsum of R hand at location of biopsy without purulent material.  Slightly erythematous border.    Impression & Recommendations:  Problem # 1:  BUNION (ICD-727.1) Pt has significant pain, not relieved adequately w/ darvocet.  Will refer to podiatry.  Orders: Podiatry  Referral (Podiatry)  Problem # 2:  ? of NEOPLASM, MALIGNANT, SKIN, MULTIPLE (ICD-173.9) Pt has had 2 biopsies done last week.  Appear erythematous but not overtly infected.  Advised to cont ointment as suggested by derm and to f/u if they get worse.   Problem # 3:  OBESITY, UNSPECIFIED (ICD-278.00) Health Lifestyles Class recommended.  Problem # 4:  HYPERTENSION (ICD-401.9) BP controlled, no changes necessary.  Her updated medication list for this problem includes:    Diltiazem Hcl Cr 180 Mg Cp24 (Diltiazem hcl) .Marland Kitchen... Take 1 tablet by mouth once a day    Atenolol 25 Mg Tabs (Atenolol) .Marland Kitchen... Take 1 tablet by mouth two times a day    Lasix 40 Mg Tabs (Furosemide) .Marland Kitchen... Take 1 tablet by mouth once a day    Lisinopril 10 Mg Tabs (Lisinopril) .Marland KitchenMarland KitchenMarland KitchenMarland Kitchen  Take 1 tablet by mouth once a day  BP today: 111/65 Prior BP: 111/62 (12/04/2008)  Labs Reviewed: K+: 4.6 (08/24/2008) Creat: : 0.74 (08/24/2008)   Chol: 163 (08/24/2008)   HDL: 35 (08/24/2008)   LDL: 63 (08/24/2008)   TG: 327 (08/24/2008)  Problem # 5:  ATRIAL FIBRILLATION (ICD-427.31) Rate controlled, coumadin per cardiology.  Her updated medication list for this problem includes:    Diltiazem Hcl Cr 180 Mg Cp24 (Diltiazem hcl) .Marland Kitchen... Take 1 tablet by mouth once a day    Warfarin Sodium 5 Mg Tabs (Warfarin sodium) .Marland Kitchen... Take as directed.    Atenolol 25 Mg Tabs (Atenolol) .Marland Kitchen... Take 1 tablet by mouth two times a day    Digitek 0.25 Mg Tabs (Digoxin) .Marland Kitchen... Take 1 tablet by mouth once a day  Problem # 6:  ESOPHAGEAL STRICTURE (ICD-530.3) Recently dilated at the same time colonoscopy done.  No recurrent symptoms.  Complete Medication List: 1)  Metformin Hcl 1000 Mg Tabs (Metformin hcl) .... Take 1 tablet by mouth two times a day 2)  Glipizide 10 Mg Tabs (Glipizide) .... One  tab two times a day 3)  Diltiazem Hcl Cr 180 Mg Cp24 (Diltiazem hcl) .... Take 1 tablet by mouth once a day 4)  Darvocet-n 100 100-650 Mg Tabs (Propoxyphene n-apap)  .... Take 1 tablet by mouth every 4 hours as needed for pain 5)  Warfarin Sodium 5 Mg Tabs (Warfarin sodium) .... Take as directed. 6)  Lexapro 10 Mg Tabs (Escitalopram oxalate) .... Take 1 tablet by mouth once a day 7)  Atenolol 25 Mg Tabs (Atenolol) .... Take 1 tablet by mouth two times a day 8)  Omeprazole 20 Mg Cpdr (Omeprazole) .... Take 1 tablet by mouth once a day 9)  Calcium 600/vitamin D 600-400 Mg-unit Tabs (Calcium carbonate-vitamin d) .... Take 1 tablet by mouth two times a day 10)  Multivitamins Tabs (Multiple vitamin) .... Take 1 tablet by mouth once a day 11)  Levothyroxine Sodium 75 Mcg Tabs (Levothyroxine sodium) .... Take 1 tablet by mouth once a day 12)  Lasix 40 Mg Tabs (Furosemide) .... Take 1 tablet by mouth once a day 13)  Klor-con 10 10 Meq Tbcr (Potassium chloride) .... Take 1 tablet by mouth two times a day 14)  Digitek 0.25 Mg Tabs (Digoxin) .... Take 1 tablet by mouth once a day 15)  Amitriptyline Hcl 50 Mg Tabs (Amitriptyline hcl) .... Take 1 tablet by mouth at bedtime 16)  Fish Oil 1000 Mg Caps (Omega-3 fatty acids) .... Take 1 tablet by mouth twice a day 17)  Relion Blood Glucose Test Strp (Glucose blood) .... To test blood sugar twice daily 18)  Lancets Misc (Lancets) .... To test blood sugar twice daily 19)  Lisinopril 10 Mg Tabs (Lisinopril) .... Take 1 tablet by mouth once a day 20)  Pravachol 20 Mg Tabs (Pravastatin sodium) .... Take 1/2 tab by mouth before bedtime and increase to a full tab at bedtime after one week. 21)  Anusol-hc 25 Mg Supp (Hydrocortisone acetate) .... Insert one supp into rectum once a day 22)  Tylenol Extra Strength 500 Mg Tabs (Acetaminophen) .... Take 1 tablet by mouth every 6 hours as needed for pain  Other Orders: T- Capillary Blood Glucose ([address])  Patient Instructions: 1)  Please make a followup appointment in 3 months for routine care. 2)  Please see the podiatrist regarding your foot. 3)  Call sooner if the areas on your  skin get worse. 4)  Please  go to the Healthy Lifestyles Class.   Prevention & Chronic Care Immunizations   Influenza vaccine: Fluvax MCR  (12/04/2008)   Influenza vaccine deferral: Not available  (08/24/2008)   Influenza vaccine due: 11/07/2008    Tetanus booster: Not documented   Td booster deferral: Deferred  (08/24/2008)    Pneumococcal vaccine: Not documented   Pneumococcal vaccine deferral: Deferred  (08/24/2008)    H. zoster vaccine: Not documented   H. zoster vaccine deferral: Deferred  (08/24/2008)  Colorectal Screening   Hemoccult: Not documented   Hemoccult action/deferral: Ordered  (08/24/2008)    Colonoscopy: Location:  [facility].    (10/22/2008)   Colonoscopy action/deferral: Repeat colonoscopy in 7 years. 2012  (08/22/2003)   Colonoscopy due: 05/2010  Other Screening   Pap smear: Not documented   Pap smear action/deferral: Not indicated S/P hysterectomy  (08/24/2008)    Mammogram: ASSESSMENT: Negative - BI-RADS 1^MM DIGITAL SCREENING  (11/19/2008)   Mammogram due: 10/2008    DXA bone density scan:  Lumbar Spine:  T Score > -1.0 Spine.  Hip Total: T Score -2.5 to -1.0 Hip.      (03/20/2008)   DXA scan due: 03/2010    Smoking status: never  (12/04/2008)  Diabetes Mellitus   HgbA1C: 6.1  (12/04/2008)   HgbA1C action/deferral: Ordered  (12/04/2008)    Eye exam: Not documented    Foot exam: yes  (03/13/2008)   Foot exam action/deferral: Do today   High risk foot: Not documented   Foot care education: Not documented   Foot exam due: 03/13/2009    Urine microalbumin/creatinine ratio: 23.8  (12/15/2007)    Diabetes flowsheet reviewed?: Yes   Progress toward A1C goal: At goal  Lipids   Total Cholesterol: 163  (08/24/2008)   Lipid panel action/deferral: Lipid Panel ordered   LDL: 63  (08/24/2008)   LDL Direct: Not documented   HDL: 35  (08/24/2008)   Triglycerides: 327  (08/24/2008)    SGOT (AST): 51  (08/30/2008)   SGPT (ALT):  60  (08/30/2008)   Alkaline phosphatase: 75  (08/30/2008)   Total bilirubin: 0.7  (08/30/2008)    Lipid flowsheet reviewed?: Yes   Progress toward LDL goal: At goal  Hypertension   Last Blood Pressure: 111 / 65  (01/01/2009)   Serum creatinine: 0.74  (08/24/2008)   Serum potassium 4.6  (08/24/2008)    Hypertension flowsheet reviewed?: Yes   Progress toward BP goal: At goal  Self-Management Support :   Personal Goals (by the next clinic visit) :     Personal A1C goal: 7  (12/04/2008)     Personal blood pressure goal: 130/80  (12/04/2008)     Personal LDL goal: 100  (12/04/2008)    Patient will work on the following items until the next clinic visit to reach self-care goals:     Medications and monitoring: take my medicines every day, check my blood sugar  (01/01/2009)     Eating: eat more vegetables, eat foods that are low in salt  (01/01/2009)     Activity: take a 30 minute walk every day  (01/01/2009)    Diabetes self-management support: Written self-care plan  (01/01/2009)   Diabetes care plan printed   Last diabetes self-management training by diabetes educator: 07/25/2007   Last medical nutrition therapy: 03/08/2007    Hypertension self-management support: Written self-care plan  (01/01/2009)   Hypertension self-care plan printed.    Lipid self-management support: Written self-care plan  (01/01/2009)   Lipid self-care plan  printed.   Nursing Instructions: CBG today (see order)

## 2010-04-08 NOTE — Medication Information (Signed)
Summary: rov  Anticoagulant Therapy  Managed by: Gala Lewandowsky, PharmD Referring MD: Rollene Rotunda MD PCP: Joaquin Courts  MD Supervising MD: Antoine Poche MD, Fayrene Fearing Indication 1: Atrial Fibrillation (ICD-427.31) Lab Used: [facility] 18.8 INR POC 2.4  Dietary changes: no    Health status changes: yes       Details: 3-4weeks of chest tightness- feels like pushing; some chest pain associated with tightness but not all the time; increased sweating with minimal exertion   Bleeding/hemorrhagic complications: no    Recent/future hospitalizations: no    Any changes in medication regimen? no    Recent/future dental: yes     Details: had crown on 6/10  Any missed doses?: no       Is patient compliant with meds? yes      Comments: Traveling on 6/25  Allergies: 1)  ! Gemfibrozil (Gemfibrozil) 2)  ! Penicillin 3)  ! Codeine 4)  ! * Latex  Anticoagulation Management History:      Positive risk factors for bleeding include history of CVA/TIA and presence of serious comorbidities.  Negative risk factors for bleeding include an age less than 54 years old.  The bleeding index is 'intermediate risk'.  Positive CHADS2 values include History of HTN, History of Diabetes, and Prior Stroke/CVA/TIA.  Negative CHADS2 values include Age > 69 years old.  The start date was 07/22/2004.  Her last INR was 1.6.    Anticoagulation Management Assessment/Plan:      The patient's current anticoagulation dose is Warfarin sodium 5 mg tabs: Take as directed..  She is to have a 09/20/2008.  Anticoagulation instructions were given to patient.  Results were reviewed/authorized by Gala Lewandowsky, PharmD.  She was notified by Gala Lewandowsky, PharmD.         Prior Anticoagulation Instructions: 2.5 MG SAT/5 MG DAILY  Current Anticoagulation Instructions: INR 2.4  The patient is to continue with the same dose of coumadin.  This dosage includes: 5mg  everyday with 2.5mg  on Saturday  Dr. Ulyses Amor)- Friday July 5th at  9:00am  Dr. Antoine Poche (Heart)- Thursday July 15th at 4:30pm

## 2010-04-08 NOTE — Procedures (Signed)
Summary: summary report  summary report   Imported By: Mirna Mires 06/06/2009 11:42:56  _____________________________________________________________________  External Attachment:    Type:   Image     Comment:   External Document

## 2010-04-08 NOTE — Progress Notes (Signed)
Summary: Refill/gh  Phone Note Refill Request Message from:  Pharmacy on February 07, 2008 3:29 PM  Refills Requested: Medication #1:  DARVOCET-N 100 100-650 MG TABS Take 1 tablet by mouth every 4 hours as needed for pain   Last Refilled: 06/01/2007  Method Requested: Electronic Initial call taken by: Angelina Ok RN,  February 07, 2008 3:29 PM  Follow-up for Phone Call        Rx denied because we have not discussed why she needs darvocet and I don't see a compellling indication in the chart at this point.  Follow-up by: Joaquin Courts  MD,  February 08, 2008 12:19 PM  Additional Follow-up for Phone Call Additional follow up Details #1::        Rx denial called/faxed to pharmacy Additional Follow-up by: Angelina Ok RN,  February 09, 2008 9:39 AM

## 2010-04-08 NOTE — Progress Notes (Signed)
Summary: pt needs letter of clearence   Phone Note Call from Patient Call back at Home Phone [phone]   Caller: Patient Reason for Call: Talk to Nurse, Talk to Doctor Summary of Call: pt is aware she needs to stop her coumadin 5days prior to procedure but she needs a note sent to Dr. Gae Bon please fax to [phone] please put to attn to Andrey Farmer her phone number is [phone]. Pt is very concerned taht if she dosen't get the letter of clearence in asap they will cancel her surgery. Initial call taken by: Omer Jack,  July 01, 2009 11:46 AM  Follow-up for Phone Call        spoke with Lupita Leash at Dr Lucillie Garfinkel office.  She states she faxed over a form to be filled out last week and again this morning.  I told he I would look for it and have Dr Antoine Poche fill it out tomorrow but I would fax over today the nuc study and the ok to hold Coumadin for 5 days prior. Dennis Bast, RN, BSN  July 01, 2009 12:18 PM

## 2010-04-08 NOTE — Assessment & Plan Note (Signed)
Summary: DM TRAINING/VS              Is Patient Diabetic? Yes        Current Allergies: ! GEMFIBROZIL (GEMFIBROZIL) ! PENICILLIN ! CODEINE ! * LATEX          ]  Diabetes Self Management Training  PCP:  Peggye Pitt MD Referring MD: Ardyth Harps Date diagnosed with diabetes: 01/07/2006 Diabetes Type: Type 2 non-insulin treated Other persons present: no Current smoking Status: never  Assessment Sources of Support: sister, daughter Special needs or Barriers: has difficulty sleeping due to anxiety, so often naps during the day Affect: Anxious  Diabetes Medications:  On Lipid lowering Medication? No On Anti-platelet Medication?     Yes Herbs or Supplements:              Yes Comments: CBGs 48-180 range over last week.    Recent Episodes of: Hypoglycemia: Yes     Estimated /Usual Carb Intake Breakfast # of Carbs/Grams 2=30gm Lunch # of Carbs/Grams 2=30gm Dinner # of Carbs/Grams 2=30gm Protein: adequate protein intake Fat: Appropriate fat intake Salt: appropriate sodium intake  Nutrition assessment Weight change: no ETOH : No Do you read food labels? If so what do you look at?  yes- carb grams and fat- describes healthy intake- recently decreased carbs by adding more vegetables  Type of physical activity  Walk Length of time: 10-40 minutes # of times per week: 7        Note improved blood glucose iwth A1C of 6.4%. Patient had a fall in shower and was takento ER and wonders if it was due to hypoglycemia. Very afraid of hypoglycemia- eats sncaks at night to prevent- has has multiple bouts of hypoglycemia after dinner. Does admit to delayed lunch, but not dinner.    Uses [employer] reliOn meter-feels better control with being able to afford strips and monitor sugar.   Patient very motivated to acheive weight < 200 #. Agreed to continue checking CBgs frequently to maintian tight control. Will request a decrease in PM glipizide to decrease  incidence of PM hypoglycemia and encouraged patient to call if continued diet and increased activity cause addiitonal hypoglycemia.  Follow-up: 3 months per patient request. ( has done weight watchers and feels compfortable with knowing  how to decrease weight) Diabetes Self managment support: office appointment & family & friends    Last LDL:                                                 105 (06/16/2007 8:41:00 PM)

## 2010-04-08 NOTE — Progress Notes (Signed)
  Phone Note Outgoing Call   Summary of Call: Please call patient and ask her to come in for labs today or tomorrow morning.  Initial call taken by: Ned Grace MD,  August 29, 2008 9:34 AM  Follow-up for Phone Call        pt seen this am for labs, pt telephone number verified and informed she will be contacted with results.Krystal Eaton [facility])  August 30, 2008 11:14 AM  Follow-up by: Krystal Eaton [facility]),  August 30, 2008 11:14 AM  New Problems: TRANSAMINASES, SERUM, ELEVATED (ICD-790.4)   New Problems: TRANSAMINASES, SERUM, ELEVATED (ICD-790.4)

## 2010-04-08 NOTE — Progress Notes (Signed)
Summary: Office Visit  Office Visit   Imported By: Florinda Marker 08/25/2006 09:42:43  _____________________________________________________________________  External Attachment:    Type:   Image     Comment:   External Document

## 2010-04-08 NOTE — Progress Notes (Signed)
Summary: refill/gg  Phone Note Refill Request  on January 22, 2009 3:51 PM  Refills Requested: Medication #1:  PRAVACHOL 20 MG TABS Take 1/2 tab by mouth before bedtime and increase to a full tab at bedtime after one week.   Last Refilled: 12/28/2008  Medication #2:  WARFARIN SODIUM 5 MG TABS Take as directed.   Last Refilled: 12/14/2008  Medication #3:  OMEPRAZOLE 20 MG CPDR Take 1 tablet by mouth once a day   Last Refilled: 11/22/2008  Method Requested: Electronic Initial call taken by: Merrie Roof RN,  January 22, 2009 3:51 PM  Follow-up for Phone Call        Coumadin should be sent to her cardiologist b/c they manage her INR.  Please let the pharmacy know ASAP so she doesn't run out. Follow-up by: Joaquin Courts  MD,  January 23, 2009 9:14 AM  Additional Follow-up for Phone Call Additional follow up Details #1::        pharmacy informed Additional Follow-up by: Merrie Roof RN,  January 25, 2009 6:06 PM    Prescriptions: OMEPRAZOLE 20 MG CPDR (OMEPRAZOLE) Take 1 tablet by mouth once a day  #30 x 6   Entered and Authorized by:   Joaquin Courts  MD   Signed by:   Joaquin Courts  MD on 01/23/2009   Method used:   Electronically to        [facility]* (retail)       [address]       [place], [place]  [address]       Ph: [phone]       Fax: [phone]   RxID:   [id number] PRAVACHOL 20 MG TABS (PRAVASTATIN SODIUM) Take 1/2 tab by mouth before bedtime and increase to a full tab at bedtime after one week.  #30 x 6   Entered and Authorized by:   Joaquin Courts  MD   Signed by:   Joaquin Courts  MD on 01/23/2009   Method used:   Electronically to        [facility]* (retail)       [address]       [place], [place]  [address]       Ph: [phone]       Fax: [phone]   RxID:   [id number]

## 2010-04-08 NOTE — Assessment & Plan Note (Signed)
Summary: Cardiology Nuclear Study  Nuclear Med Background Indications for Stress Test: Evaluation for Ischemia   History: Abnormal EKG, Echo, GXT, Heart Catheterization, Myocardial Perfusion Study  History Comments: '04 Heart Cath NL '09 ECHO EF 55-60% '09 MPS NL 05/28/09 GXT non-DX reccomend MPS NL. coronaries Abnormal EKG: AFIB  Symptoms: Chest Pain, Palpitations    Nuclear Pre-Procedure Cardiac Risk Factors: Carotid Disease, Hypertension, NIDDM Caffeine/Decaff Intake: None NPO After: 9:00 PM Lungs: clear IV 0.9% NS with Angio Cath: 22g     IV Site: (R) AC IV Started by: Irean Hong RN Chest Size (in) 42-44     Cup Size C     Height (in): 63 Weight (lb): 212 BMI: 37.69  Nuclear Med Study 1 or 2 day study:  1 day     Stress Test Type:  Eugenie Birks Reading MD:  Dietrich Pates, MD     Referring MD:  Rollene Rotunda MD Resting Radionuclide:  Technetium 104m Tetrofosmin     Resting Radionuclide Dose:  11 mCi  Stress Radionuclide:  Technetium 12m Tetrofosmin     Stress Radionuclide Dose:  33 mCi   Stress Protocol   Lexiscan: 0.4 mg   Stress Test Technologist:  Milana Na EMT-P     Nuclear Technologist:  Domenic Polite CNMT  Rest Procedure  Myocardial perfusion imaging was performed at rest 45 minutes following the intravenous administration of Myoview Technetium 65m Tetrofosmin.  Stress Procedure  The patient received IV Lexiscan 0.4 mg over 15-seconds.  Myoview injected at 30-seconds.  There were no significant changes, + sob, nausea, headache, and rare pvcs with infusion.  Quantitative spect images were obtained after a 45 minute delay.  QPS Raw Data Images:  Stress images were motion corrected.  Soft tissue (diaphragm) underlies heart. Stress Images:  There is normal uptake in all areas. Rest Images:  Normal homogeneous uptake in all areas of the myocardium. Subtraction (SDS):  No evidence of ischemia. Transient Ischemic Dilatation:  1.0  (Normal <1.22)  Lung/Heart Ratio:  .42  (Normal <0.45)  Quantitative Gated Spect Images QGS EDV:  84 ml QGS ESV:  27 ml QGS EF:  68 %   Overall Impression  Exercise Capacity: Lexiscan protocol BP Response: Normal blood pressure response. Clinical Symptoms: Shortness of breath. ECG Impression: No significant ST segment change suggestive of ischemia. Overall Impression: Normal stress nuclear study.  Appended Document: Cardiology Nuclear Study NL.  No evidence of ischemia or infarct.  Appended Document: Cardiology Nuclear Study Aware of results

## 2010-04-08 NOTE — Consult Note (Signed)
Summary: [facility]  [facility]   Imported By: Florinda Marker 03/18/2009 14:34:53  _____________________________________________________________________  External Attachment:    Type:   Image     Comment:   External Document

## 2010-04-08 NOTE — Progress Notes (Signed)
Summary: med refill/gp  Phone Note Refill Request Message from:  Fax from Pharmacy on Jul 08, 2007 3:39 PM  Refills Requested: Medication #1:  AMITRIPTYLINE HCL 50 MG  TABS Take 1 tablet by mouth at bedtime   Last Refilled: 06/11/2007  Method Requested: electronic Initial call taken by: Chinita Pester RN,  Jul 08, 2007 3:40 PM  Follow-up for Phone Call        Refilled electronically. Follow-up by: Peggye Pitt MD,  Jul 11, 2007 9:05 AM      Prescriptions: AMITRIPTYLINE HCL 50 MG  TABS (AMITRIPTYLINE HCL) Take 1 tablet by mouth at bedtime  #31 x 5   Entered and Authorized by:   Peggye Pitt MD   Signed by:   Peggye Pitt MD on 07/11/2007   Method used:   Electronically sent to ...       [facility]*       [address]       [place], [place]  [address]       Ph: [phone]       Fax: [phone]   RxID:   [id number]

## 2010-04-08 NOTE — Miscellaneous (Signed)
Summary: INR check  Clinical Lists Changes  Pt came in today for INR.  Today it was 4.1 and she notes it had been 2.8 about one week ago.  Spoke w/ Dr. Alexandria Lodge and plan is to hold coumadin today and tomorrow and restart on 1/10 since today is her last dose of cipro.  Spoke w/ pt and plans to call [facility] where she normally gets her INR's done and f/u w/ them for repeat on 1/11.  I told her to come here on Monday to get her INR done if she can't get an appt w/ them and she is in agreement.  Her urinary symptoms are also better.

## 2010-04-08 NOTE — Progress Notes (Signed)
Summary: PM hypoglycemia/dmr  Phone Note Outgoing Call   Call placed by: Jamison Neighbor RD,CDE,  September 05, 2007 6:40 PM Summary of Call: Patient very motivated to achieve weight < 200 #. Agreed to continue checking CBgs frequently to maintian tight control.   Request a decrease in PM glipizide to decrease incidence of PM hypoglycemia (runs 48-119 range at bedtime with one 167) and encouraged patient to call if continued diet and increased activity cause addiitonal hypoglycemia. This will assist with weight loss as she is having ot eats snacks in Pm to prevent a or to treat hypoglycemia.  Follow-up for Phone Call        Agree with Lupita Leash R.'s plan. Will need to followup with new PCP. Follow-up by: Peggye Pitt MD,  September 06, 2007 9:29 AM

## 2010-04-08 NOTE — Consult Note (Signed)
Summary: [place]: Cardiology Office Note  [place]: Cardiology Office Note   Imported By: Florinda Marker 11/22/2006 10:10:57  _____________________________________________________________________  External Attachment:    Type:   Image     Comment:   External Document

## 2010-04-08 NOTE — Procedures (Signed)
Summary: Endoscopy   EGD  Procedure date:  10/22/2008  Findings:      Location: [facility]   ENDOSCOPY PROCEDURE REPORT  PATIENT:  [name], [name]  MR#:  [id number] BIRTHDATE:   21-Jul-1950, 58 yrs. old   GENDER:   female  ENDOSCOPIST:   Vania Rea. Jarold Motto, MD, Clementeen Graham ASSISTANT:    PROCEDURE DATE:  10/22/2008 PROCEDURE:  EGD, diagnostic, Maloney Dilation of the Esophagus ASA CLASS:   Class III INDICATIONS: 1) dysphagia PRIOR FUNDOPLICATION.  MEDICATIONS:    Versed 2 mg IV TOPICAL ANESTHETIC:   Exactacain Spray  DESCRIPTION OF PROCEDURE:   After the risks benefits and alternatives of the procedure were thoroughly explained, informed consent was obtained.  The LB [id number] endoscope was introduced through the mouth and advanced to the second portion of the duodenum, without limitations.  The instrument was slowly withdrawn as the mucosa was carefully examined. <<PROCEDUREIMAGES>>      <<OLD IMAGES>>  s/p fundoplication. INTACT FUNDOPLICATION DILATED WITH #52 FRENCH MALONEY.  The duodenal bulb was normal in appearance, as was the postbulbar duodenum.  The stomach was entered and closely examined. The antrum, angularis, and lesser curvature were well visualized, including a retroflexed view of the cardia and fundus. The stomach wall was normally distensable. The scope passed easily through the pylorus into the duodenum.    Dilation was then performed at the    COMPLICATIONS:   None  ENDOSCOPIC IMPRESSION:  1) S/p fundoplication  2) Normal duodenum  3) Normal stomach  "SNUG" FUNDOPLICATION AREA DILATED. RECOMMENDATIONS:  1) dilatations PRN  2) continue PPI  RESUME COUMADIN RX.,,AND ALL OTHER MEDS.  REPEAT EXAM:   No   _______________________________ Vania Rea. Jarold Motto, MD, [facility]   CC:

## 2010-04-08 NOTE — Assessment & Plan Note (Signed)
Summary: dysphagia...as.    History of Present Illness Visit Type: Follow-up Visit Primary GI MD: Kimberly Bison MD FACP FAGA Primary [name]: Kimberly Quitter MD Chief Complaint: Solid food and pill dysphagia with coughing and hoarseness.  History of Present Illness:   Very Complicated 60 year old Caucasian female on greater than 20 medications for multiple medical problems including obesity, diabetes, hypertension, and peripheral neuropathy with fibromyalgia. She is status post multiple surgical procedures including cholecystectomy, ventral hernia repair, and fundoplication many years ago by Dr. Francina [name].  Chest intermittent solid food dysphagia with partial relief a year ago at the time of endoscopy and esophageal dilatation. She is on chronic Coumadin, oral diabetic medications, antidepressants and narcotics. She denies typical reflux symptoms, and is not on PPI therapy. She has had no anorexia, weight loss, specific hepatobiliary or lower gastrointestinal symptomatology at this time. She is up-to-date on her colonoscopy exams.She has been on multiple antibiotics over the last several months.   GI Review of Systems    Reports dysphagia with liquids and  dysphagia with solids.      Denies abdominal pain, acid reflux, belching, bloating, chest pain, heartburn, loss of appetite, nausea, vomiting, vomiting blood, weight loss, and  weight gain.      Reports diarrhea.     Denies anal fissure, black tarry stools, change in bowel habit, constipation, diverticulosis, fecal incontinence, heme positive stool, hemorrhoids, irritable bowel syndrome, jaundice, light color stool, liver problems, rectal bleeding, and  rectal pain.    Current Medications (verified): 1)  Metformin Hcl 1000 Mg Tabs (Metformin Hcl) .... Take 1 Tablet By Mouth Two Times A Day 2)  Glipizide 10 Mg  Tabs (Glipizide) .... Take 1 Tablet By Mouth Two Times A Day 3)  Diltiazem Hcl Cr 180 Mg Cp24 (Diltiazem Hcl) .... Take 1  Tablet By Mouth Once A Day 4)  Warfarin Sodium 5 Mg Tabs (Warfarin Sodium) .... Take As Directed. 5)  Atenolol 25 Mg Tabs (Atenolol) .... Take 1 Tablet By Mouth Two Times A Day 6)  Omeprazole 20 Mg Cpdr (Omeprazole) .... Take 1 Tablet By Mouth Once A Day Prn 7)  Calcium 600/vitamin D 600-400 Mg-Unit Tabs (Calcium Carbonate-Vitamin D) .... Take 1 Tablet By Mouth Two Times A Day 8)  Multivitamins Tabs (Multiple Vitamin) .... Take 1 Tablet By Mouth Once A Day 9)  Levothyroxine Sodium 75 Mcg Tabs (Levothyroxine Sodium) .... Take 1 Tablet By Mouth Once A Day 10)  Lasix 40 Mg Tabs (Furosemide) .... Take 1 Tablet By Mouth Once A Day 11)  Klor-Con 10 10 Meq Tbcr (Potassium Chloride) .... Take 1 Tablet By Mouth Two Times A Day 12)  Digitek 0.25 Mg Tabs (Digoxin) .... Take 1 Tablet By Mouth Once A Day 13)  Fish Oil 1000 Mg Caps (Omega-3 Fatty Acids) .... Take 1 Tablet By Mouth Twice A Day 14)  Relion Blood Glucose Test   Strp (Glucose Blood) .... To Test Blood Sugar Twice Daily 15)  Lancets   Misc (Lancets) .... To Test Blood Sugar Twice Daily 16)  Lisinopril 10 Mg  Tabs (Lisinopril) .... Take 1 Tablet By Mouth Once A Day 17)  Pravachol 40 Mg Tabs (Pravastatin Sodium) .... Two Tablets By Mouth Once Daily. 18)  Prozac 20 Mg Caps (Fluoxetine Hcl) .... Take 1 Tablet By Mouth Every Morning 19)  Flexeril 5 Mg Tabs (Cyclobenzaprine Hcl) .... Take 1 Tab By Mouth Every 12 Hours As Needed For Pain. 20)  Niacin 500 Mg Tabs (Niacin) .... Two Tabs By Mouth  Twice A Day. 21)  Amitriptyline Hcl 50 Mg Tabs (Amitriptyline Hcl) .... Take 1 Tab By Mouth At Bedtime 22)  Vicodin 5-500 Mg Tabs (Hydrocodone-Acetaminophen) .Marland Kitchen.. 1 Tab Every 12 Hours As Needed For Pain 23)  Gabapentin 100 Mg Caps (Gabapentin) .... Take 1 Tab By Mouth At Bedtime 24)  Tricor 145 Mg Tabs (Fenofibrate) .... Take 1 Tablet By Mouth Once A Day  Allergies (verified): 1)  ! Gemfibrozil (Gemfibrozil) 2)  ! Penicillin 3)  ! Codeine 4)  ! *  Latex  Past History:  Past medical, surgical, family and social histories (including risk factors) reviewed for relevance to current acute and chronic problems.  Past Medical History: Reviewed history from 11/02/2008 and no changes required. Atrial fibrillation Diabetes mellitus, type II Diverticulosis, colon Hypertension Hypothyroidism Osteoarthritis Esophageal stricture, hx of Hiatal hernia, hx of, s/p Cholecystitis, hx of, s/p cholecystectomy Obstructive sleep apnea Sarcoidosis Diabetic neuropathy Atypical chest pain, hx of Benign right breast mass, hx of (papilloma) Diabetic Neuropathy Fibromyalgia  Past Surgical History: Hiatal hernia repair Cholecystectomy Esophageal dilation x 2 Hysterectomy Excision of milk duct of right breast -> benign (1/08) Hernia Surgery(femoral) Right foot surgery 06/2009 Left foot surgery 09/2009  Family History: Reviewed history from 11/02/2008 and no changes required. Maternal uncle w/ dementia.   Melenoma: Father Family History of Colitis/Crohn's:Mother Family History of Diabetes: Father & Sister Family History of Heart Disease: Father  Social History: Reviewed history from 11/02/2008 and no changes required. Never Smoked Alcohol use-no Drug use-no Divorced On disability  Review of Systems  The patient denies allergy/sinus, anemia, anxiety-new, arthritis/joint pain, back pain, blood in urine, breast changes/lumps, change in vision, confusion, cough, coughing up blood, depression-new, fainting, fatigue, fever, headaches-new, hearing problems, heart murmur, heart rhythm changes, itching, menstrual pain, muscle pains/cramps, night sweats, nosebleeds, pregnancy symptoms, shortness of breath, skin rash, sleeping problems, sore throat, swelling of feet/legs, swollen lymph glands, thirst - excessive , urination - excessive , urination changes/pain, urine leakage, vision changes, and voice change.    Vital Signs:  Patient profile:   61  year old female Menstrual status:  postmenopausal Height:      63 inches Weight:      205 pounds BMI:     36.45 Pulse rate:   100 / minute Pulse rhythm:   regular BP sitting:   106 / 66  (right arm) Cuff size:   regular  Vitals Entered By: Christie Nottingham CMA Duncan Dull) (November 12, 2009 11:18 AM)  Physical Exam  General:  Well developed, well nourished, no acute distress. Head:  Normocephalic and atraumatic. Eyes:  PERRLA, no icterus.exam deferred to patient's ophthalmologist.   Mouth:  No deformity or lesions, dentition normal.no evidence of thrush or other mucosal lesions in the mouth or oropharynx. Neck:  Supple; no masses or thyromegaly. Lungs:  Clear throughout to auscultation. Heart:  Regular rate and rhythm; no murmurs, rubs,  or bruits. Abdomen:  large midline abdominal scar noted without organomegaly, masses, tenderness, or evidence of palpable hernia. Bowel sounds are normal and there is no abdominal bruit. Extremities:  No clubbing, cyanosis, edema or deformities noted. Psych:  Alert and cooperative. Normal mood and affect.   Impression & Recommendations:  Problem # 1:  DYSPHAGIA UNSPECIFIED (ICD-787.20) Assessment Unchanged Consider Esophageal motility disturbance versus recurrent stricturing despite fundoplication therapy. We will treat her with Mycostatin mouthwash q.i.d., repeat her endoscopy, empiric dilation, and consider esophageal manometry.I will not stop her Coumadin for this procedure. Orders: EGD (EGD)  Problem # 2:  TRANSAMINASES, SERUM, ELEVATED (ICD-790.4) Assessment: Unchanged workup to date consistent with infiltration of liver versus mild elevated liver enzymes related to multiple medications. Consider abdominal ultrasound if not previously performed. There has been no evidence of underlying cirrhosis.  Problem # 3:  CHOLECYSTECTOMY, HX OF (ICD-V45.79) Assessment: Comment Only  Problem # 4:  DIABETES MELLITUS, TYPE II (ICD-250.00) Assessment:  Unchanged changes in her diabetic medications outlined for endoscopic procedure in the early a.m.  Patient Instructions: 1)  Copy sent to : Joaquin Courts  MD 2)  Your prescription(s) have been sent to you pharmacy.  3)  You EGD is scheduled for 11/20/2009, please follow seperate instruction.  4)  The medication list was reviewed and reconciled.  All changed / newly prescribed medications were explained.  A complete medication list was provided to the patient / caregiver. Prescriptions: NYSTATIN 100000 UNIT/ML SUSP (NYSTATIN) swish and swallow four times a day  #1 pint x 0   Entered by:   Harlow Mares CMA (AAMA)   Authorized by:   Mardella Layman MD [facility]   Signed by:   Harlow Mares CMA (AAMA) on 11/12/2009   Method used:   Electronically to        [facility] (retail)       [address]       [place], [place]  [address]       Ph: [phone]       Fax: [phone]   RxID:   [id number]

## 2010-04-08 NOTE — Letter (Signed)
Summary: [facility] Important Medical Request   [facility]   Imported By: Roderic Ovens 08/07/2009 11:55:04  _____________________________________________________________________  External Attachment:    Type:   Image     Comment:   External Document

## 2010-04-08 NOTE — Medication Information (Signed)
Summary: [name]  Anticoagulant Therapy  Managed by: Weston Brass, PharmD Referring MD: Rollene Rotunda MD PCP: Joaquin Courts  MD Supervising MD: Antoine Poche MD, Fayrene Fearing Indication 1: Atrial Fibrillation (ICD-427.31) Lab Used: [facility] Site: [facility] INR POC 3.1 INR RANGE 2-3  Dietary changes: no    Health status changes: yes       Details: Pt has diahrrea since last Tuesday due to a stomach bug.   Bleeding/hemorrhagic complications: no    Recent/future hospitalizations: no    Any changes in medication regimen? no    Recent/future dental: no  Any missed doses?: no       Is patient compliant with meds? yes       Allergies: 1)  ! Gemfibrozil (Gemfibrozil) 2)  ! Penicillin 3)  ! Codeine 4)  ! * Latex  Anticoagulation Management History:      The patient is taking warfarin and comes in today for a routine follow up visit.  Positive risk factors for bleeding include history of CVA/TIA and presence of serious comorbidities.  Negative risk factors for bleeding include an age less than 32 years old.  The bleeding index is 'intermediate risk'.  Positive CHADS2 values include History of HTN, History of Diabetes, and Prior Stroke/CVA/TIA.  Negative CHADS2 values include Age > 58 years old.  The start date was 07/22/2004.  Her last INR was 1.6.  Anticoagulation responsible provider: Antoine Poche MD, Fayrene Fearing.  INR POC: 3.1.  Cuvette Lot#: [id number].  Exp: 12/2010.    Anticoagulation Management Assessment/Plan:      The patient's current anticoagulation dose is Warfarin sodium 5 mg tabs: Take as directed..  The target INR is 2 - 3.  The next INR is due 11/15/2009.  Anticoagulation instructions were given to patient.  Results were reviewed/authorized by Weston Brass, PharmD.  She was notified by Gweneth Fritter, PharmD Candidate.         Prior Anticoagulation Instructions: INR 3.4  Skip today's dosage of coumadin, then resume same dosage 1 tablet daily except 1/2 tablet on Saturdays.  Recheck in 3  weeks.    Current Anticoagulation Instructions: INR 3.1  Today, take 1/2 tablet (2.5mg ).  Then resume normal schedule of taking 1 tablet (5mg ) every day except take 1/2 tablet (2.5mg ) on Saturdays.  Recheck in 2 weeks.

## 2010-04-08 NOTE — Medication Information (Signed)
Summary: rov/eh  Anticoagulant Therapy  Managed by: Bethena Midget, RN, BSN Referring MD: Rollene Rotunda MD PCP: Joaquin Courts  MD Supervising MD: Shirlee Latch MD, Eliyana Pagliaro Indication 1: Atrial Fibrillation (ICD-427.31) Lab Used: [facility] Site: [facility] INR POC 3.4 INR RANGE 2-3  Dietary changes: no    Health status changes: no    Bleeding/hemorrhagic complications: no    Recent/future hospitalizations: no    Any changes in medication regimen? no    Recent/future dental: no  Any missed doses?: no       Is patient compliant with meds? yes       Allergies: 1)  ! Gemfibrozil (Gemfibrozil) 2)  ! Penicillin 3)  ! Codeine 4)  ! * Latex  Anticoagulation Management History:      The patient is taking warfarin and comes in today for a routine follow up visit.  Positive risk factors for bleeding include history of CVA/TIA and presence of serious comorbidities.  Negative risk factors for bleeding include an age less than 69 years old.  The bleeding index is 'intermediate risk'.  Positive CHADS2 values include History of HTN, History of Diabetes, and Prior Stroke/CVA/TIA.  Negative CHADS2 values include Age > 30 years old.  The start date was 07/22/2004.  Her last INR was 1.6.  Anticoagulation responsible provider: Shirlee Latch MD, Artisha Capri.  INR POC: 3.4.  Cuvette Lot#: [id number].  Exp: 06/2010.    Anticoagulation Management Assessment/Plan:      The patient's current anticoagulation dose is Warfarin sodium 5 mg tabs: Take as directed..  The target INR is 2 - 3.  The next INR is due 05/13/2009.  Anticoagulation instructions were given to patient.  Results were reviewed/authorized by Bethena Midget, RN, BSN.  She was notified by Bethena Midget, RN, BSN.         Prior Anticoagulation Instructions: INR 2.6  Continue same dose of 1 tablet daily except 0.5 tablet on Saturdays. Recheck in 4 weeks.  Current Anticoagulation Instructions: INR 3.4 Skip today's dose then resume 5mg s everyday except  2.5mg s on Saturdays. Recheck in 3 weeks.

## 2010-04-08 NOTE — Progress Notes (Signed)
Summary: phone/gg  **  hernandez  Phone Note Call from Patient   Caller: Patient Summary of Call: Pt called and states in bed since saturday, has fever, cough with bloody mucous - getting clearer.  tongue has ? yeast infection.   She feels she is geting better but wants med for mouth.  When she drinks it burns. " [facility]" [phone] Initial call taken by: Merrie Roof RN,  April 19, 2007 11:08 AM  Follow-up for Phone Call        I would feel more comfortable having her come in for evaluation prior to prescribing meds over the phone. Please schedule her in [facility] as soon as available. Thanks Follow-up by: Peggye Pitt MD,  April 19, 2007 2:57 PM  Additional Follow-up for Phone Call Additional follow up Details #1::        Pt informed and states she will make an appointment bu tnot at this time Additional Follow-up by: Merrie Roof RN,  April 19, 2007 4:48 PM

## 2010-04-08 NOTE — Medication Information (Signed)
Summary: rov/ewj  Anticoagulant Therapy  Managed by: Weston Brass, PharmD Referring MD: Rollene Rotunda MD PCP: Joaquin Courts  MD Supervising MD: Tenny Craw MD, Gunnar Fusi Indication 1: Atrial Fibrillation (ICD-427.31) Lab Used: [facility] Site: [facility] INR POC 3.8 INR RANGE 2-3  Dietary changes: no    Health status changes: yes       Details: Had foot surgery July 13th- took Cipro x 21 days. Also taking Vicodin.    Bleeding/hemorrhagic complications: yes       Details: Reports 2 nosebleeds.  One from blowing her nose- lasted about 1 hour.  The other happened spontaneously and lasted for 20 minutes.  Recent/future hospitalizations: no    Any changes in medication regimen? no    Recent/future dental: no  Any missed doses?: no       Is patient compliant with meds? no       Allergies: 1)  ! Gemfibrozil (Gemfibrozil) 2)  ! Penicillin 3)  ! Codeine 4)  ! * Latex  Anticoagulation Management History:      The patient is taking warfarin and comes in today for a routine follow up visit.  Positive risk factors for bleeding include history of CVA/TIA and presence of serious comorbidities.  Negative risk factors for bleeding include an age less than 73 years old.  The bleeding index is 'intermediate risk'.  Positive CHADS2 values include History of HTN, History of Diabetes, and Prior Stroke/CVA/TIA.  Negative CHADS2 values include Age > 52 years old.  The start date was 07/22/2004.  Her last INR was 1.6.  Anticoagulation responsible Tramain Gershman: Tenny Craw MD, Gunnar Fusi.  INR POC: 3.8.  Cuvette Lot#: [id number].  Exp: 12/2010.    Anticoagulation Management Assessment/Plan:      The patient's current anticoagulation dose is Warfarin sodium 5 mg tabs: Take as directed..  The target INR is 2 - 3.  The next INR is due 11/01/2009.  Anticoagulation instructions were given to patient.  Results were reviewed/authorized by Weston Brass, PharmD.  She was notified by Gweneth Fritter, PharmD Candidate.         Prior  Anticoagulation Instructions: INR 3.4  Skip today's dosage of coumadin, then resume same dosage 1 tablet daily except 1/2 tablet on Saturdays.  Recheck in 3 weeks.    Current Anticoagulation Instructions: INR 3.8  Skip today's dose.  Then 1 tablet (5mg ) every day except take 1/2 tablet (2.5mg ) on Tuesdays and Saturdays.  Recheck in 2 weeks.

## 2010-04-08 NOTE — Medication Information (Signed)
Summary: rov/ewj      Allergies Added:  Anticoagulant Therapy  Managed by: Lynann Bologna, PharmD Referring MD: Rollene Rotunda MD PCP: Joaquin Courts  MD Supervising MD: Clifton James MD, Cristal Deer Indication 1: Atrial Fibrillation (ICD-427.31) Lab Used: [facility] Site: [facility] INR POC 2.6 INR RANGE 2-3  Dietary changes: no    Health status changes: no    Bleeding/hemorrhagic complications: yes       Details: Does recall a nose bleed last week that did resolve with pressure.   Recent/future hospitalizations: yes       Details: Patient is to have surgery on her foot on April 26th.   Any changes in medication regimen? yes       Details: Added Niacin to medication regimen.   Recent/future dental: no  Any missed doses?: no       Is patient compliant with meds? yes      A  Current Medications (verified): 1)  Metformin Hcl 1000 Mg Tabs (Metformin Hcl) .... Take 1 Tablet By Mouth Two Times A Day 2)  Glipizide 10 Mg  Tabs (Glipizide) .... One  Tab Two Times A Day 3)  Diltiazem Hcl Cr 180 Mg Cp24 (Diltiazem Hcl) .... Take 1 Tablet By Mouth Once A Day 4)  Warfarin Sodium 5 Mg Tabs (Warfarin Sodium) .... Take As Directed. 5)  Atenolol 25 Mg Tabs (Atenolol) .... Take 1 Tablet By Mouth Two Times A Day 6)  Omeprazole 20 Mg Cpdr (Omeprazole) .... Take 1 Tablet By Mouth Once A Day Prn 7)  Calcium 600/vitamin D 600-400 Mg-Unit Tabs (Calcium Carbonate-Vitamin D) .... Take 1 Tablet By Mouth Two Times A Day 8)  Multivitamins Tabs (Multiple Vitamin) .... Take 1 Tablet By Mouth Once A Day 9)  Levothyroxine Sodium 75 Mcg Tabs (Levothyroxine Sodium) .... Take 1 Tablet By Mouth Once A Day 10)  Lasix 40 Mg Tabs (Furosemide) .... Take 1 Tablet By Mouth Once A Day 11)  Klor-Con 10 10 Meq Tbcr (Potassium Chloride) .... Take 1 Tablet By Mouth Two Times A Day 12)  Digitek 0.25 Mg Tabs (Digoxin) .... Take 1 Tablet By Mouth Once A Day 13)  Fish Oil 1000 Mg Caps (Omega-3 Fatty Acids) .... Take 1  Tablet By Mouth Twice A Day 14)  Relion Blood Glucose Test   Strp (Glucose Blood) .... To Test Blood Sugar Twice Daily 15)  Lancets   Misc (Lancets) .... To Test Blood Sugar Twice Daily 16)  Lisinopril 10 Mg  Tabs (Lisinopril) .... Take 1 Tablet By Mouth Once A Day 17)  Pravachol 40 Mg Tabs (Pravastatin Sodium) .... Two Tablets By Mouth Once Daily. 18)  Prozac 20 Mg Caps (Fluoxetine Hcl) .... Take 1 Tablet By Mouth Every Morning 19)  Flexeril 5 Mg Tabs (Cyclobenzaprine Hcl) .... Take 1 Tab By Mouth Every 12 Hours As Needed For Pain. 20)  Niacin 500 Mg Tabs (Niacin) .... Two Tabs By Mouth Twice A Day.  Allergies (verified): 1)  ! Gemfibrozil (Gemfibrozil) 2)  ! Penicillin 3)  ! Codeine 4)  ! * Latex  Anticoagulation Management History:      The patient is taking warfarin and comes in today for a routine follow up visit.  Positive risk factors for bleeding include history of CVA/TIA and presence of serious comorbidities.  Negative risk factors for bleeding include an age less than 34 years old.  The bleeding index is 'intermediate risk'.  Positive CHADS2 values include History of HTN, History of Diabetes, and Prior Stroke/CVA/TIA.  Negative CHADS2 values include Age > 60 years old.  The start date was 07/22/2004.  Her last INR was 1.6.  Anticoagulation responsible provider: Clifton James MD, Cristal Deer.  INR POC: 2.6.  Cuvette Lot#: [id number].  Exp: 07/2010.    Anticoagulation Management Assessment/Plan:      The patient's current anticoagulation dose is Warfarin sodium 5 mg tabs: Take as directed..  The target INR is 2 - 3.  The next INR is due 07/11/2009.  Anticoagulation instructions were given to patient.  Results were reviewed/authorized by Lynann Bologna, PharmD.  She was notified by Lynann Bologna.         Prior Anticoagulation Instructions: INR 2.8  Continue on same dosage 1 tablet daily except 1/2 tablet on Saturdays.  Recheck in 4 weeks.    Current Anticoagulation Instructions: INR  2.6  The patient is to continue with the same dose of coumadin.  This dosage includes: one tab daily (5mg ) except 0.5 tab (2.5 mg) on Saturdays.   Pt to have surgery on foot on April 26th. Will get in contact with Dr. Antoine Poche for further guidance on how to manage anticoagulation for this procedure.   Did schedule a follow up on May 5th for next INR, but will reschedule if needed.

## 2010-04-08 NOTE — Assessment & Plan Note (Signed)
Summary: neck pain/wd   Vital Signs:  Patient Profile:   60 Years Old Female Height:     63 inches (160.02 cm) Weight:      217.9 pounds (99.05 kg) BMI:     38.74 Temp:     97.8 degrees F (36.56 degrees C) oral Pulse rate:   90 / minute BP sitting:   124 / 72  (right arm)  Pt. in pain?   yes    Location:   left side of neck    Intensity:   9    Type:       sharp-shooting  Vitals Entered By: Henderson Cloud (May 31, 2006 3:15 PM)              Is Patient Diabetic? Yes  Nutritional Status Obese CBG Result 155  Have you ever been in a relationship where you felt threatened, hurt or afraid?No   Does patient need assistance? Functional Status Self care Ambulation Normal   PCP:  Peggye Pitt, MD  Chief Complaint:  neck pain and six days.  History of Present Illness: [name] was in her typical state of health until 6 days ago when she woke up with severe pain in her left neck and upper left back.  She does not recall and traumas to her neck or upper back in the previous weeks, does not recall sleeping improperly, has not changed any of her sleeping habits, and has not changed any of her medications.  She has however recently started fish oils 2.5 weeks ago.  The pain is worse on any motion of her neck or left arm, but there is no associated weakness or numbness.  She has never had a pain like this in the past, it is not similar to the lower back pain that she has had recently.   She is also complaining of some decreased hearing in her left ear that is a little worse than her baseline, and an associated left sided headache.   She denies fever, photophobia, rashes, disorientation, and any other symptoms.    Prior Medications: METFORMIN HCL 1000 MG TABS (METFORMIN HCL) Take 1 tablet by mouth two times a day GLIPIZIDE 5 MG TABS (GLIPIZIDE) Take 1 tablet by mouth two times a day DILTIAZEM HCL CR 180 MG CP24 (DILTIAZEM HCL) Take 1 tablet by mouth once a day DARVOCET-N 100  100-650 MG TABS (PROPOXYPHENE N-APAP) Take 1 tablet by mouth every 4 hours as needed for pain WARFARIN SODIUM 5 MG TABS (WARFARIN SODIUM) Take 1 tablet by mouth once a day AMITRIPTYLINE HCL 25 MG TABS (AMITRIPTYLINE HCL) () Take 1 tablet by mouth once a day at bedtime LEXAPRO 10 MG TABS (ESCITALOPRAM OXALATE) Take 1 tablet by mouth once a day ATENOLOL 25 MG TABS (ATENOLOL) Take 1 tablet by mouth two times a day OMEPRAZOLE 20 MG CPDR (OMEPRAZOLE) Take 1 tablet by mouth once a day CALCARB 600/D 600-125 MG-UNIT TABS (CALCIUM-VITAMIN D) Take 1 tablet by mouth two times a day MULTIVITAMINS TABS (MULTIPLE VITAMIN) Take 1 tablet by mouth once a day LEVOTHYROXINE SODIUM 75 MCG TABS (LEVOTHYROXINE SODIUM) Take 1 tablet by mouth once a day LASIX 40 MG TABS (FUROSEMIDE) Take 1 tablet by mouth once a day KLOR-CON 10 10 MEQ TBCR (POTASSIUM CHLORIDE) Take 1 tablet by mouth two times a day DIGITEK 0.25 MG TABS (DIGOXIN) Take 1 tablet by mouth once a day HYDROXYZINE HCL 50 MG TABS (HYDROXYZINE HCL) Take 1 tablet by mouth every 6 hours as needed  for severe itching. Current Allergies: ! GEMFIBROZIL (GEMFIBROZIL) ! PENICILLIN ! CODEINE ! * LATEX     Review of Systems      See HPI   Physical Exam  General:     alert and well-developed.   Eyes:     pupils equal and pupils round.  No nystagmus, no tearing noted Ears:     L ear normal.   Neck:     Decreased lateral and up and down motion secondary to pain.  Moderate TTP over the lateral aspect of the left side of the neck ranging down to the shoulder blade. She also has pain on lateral abduction of her left arm. There is no pain on palpation of the spinal column Extremities:     Proximal and distal strentgh intact in the left upper extremity. Neurologic:     alert & oriented X3, cranial nerves II-XII intact, strength normal in all extremities, sensation intact to light touch, sensation intact to pinprick, and gait normal.      Impression &  Recommendations:  Problem # 1:  NECK PAIN, LEFT (ICD-723.1) [name]'s left sided neck and upper back pain seems is consistent with a strain of the trapezius possibly acquired during sleeping although she was unaware of it.  However, we will need to rule out an acute process considering her osteopenia, and will do so with a cervical series.  We will provide her with flexeril to take once at night, and if this is muscular in nature this should help, while it would have slowly gone away on its own regardless.  We will contact her with the results of the plain film tomorrow and based on the findings, determine if she can be observed, or if we need to pursue more detailed tests such as an MRI.  She was also advised to use ice.   Her updated medication list for this problem includes:    Darvocet-n 100 100-650 Mg Tabs (Propoxyphene n-apap) .Marland Kitchen... Take 1 tablet by mouth every 4 hours as needed for pain    Flexeril 10 Mg Tabs (Cyclobenzaprine hcl) .Marland Kitchen... Take 1 tablet by mouth once a day  Orders: Diagnostic X-Ray/Fluoroscopy (Diagnostic X-Ray/Flu)   Medications Added to Medication List This Visit: 1)  Flexeril 10 Mg Tabs (Cyclobenzaprine hcl) .... Take 1 tablet by mouth once a day  Other Orders: Capillary Blood Glucose (16109) Fingerstick (60454)   Patient Instructions: 1)  We will contact you tomorrow with the results of your plain film of your neck.  If we do not contact you by wednesday the 26th, please call us at [phone].  Based on the results of the x-rays and how helpful the flexeril is, we will determine if any additional actions need to be taken, and whether we need to see you again before you go to [place].  If you develop numbness and loss of feeling in your neck or arm, please contact us for an ururgent appointment  Prescriptions: ATENOLOL 25 MG TABS (ATENOLOL) Take 1 tablet by mouth two times a day  #60 x prn   Entered and Authorized by:   Valetta Close MD   Signed by:   Valetta Close MD on 05/31/2006   Method used:   Handwritten   RxID:   [id number] FLEXERIL 10 MG TABS (CYCLOBENZAPRINE HCL) Take 1 tablet by mouth once a day  #30 x 0   Entered and Authorized by:   Valetta Close MD   Signed by:   Valetta Close MD on  05/31/2006   Method used:   Handwritten   RxID:   [id number]

## 2010-04-08 NOTE — Assessment & Plan Note (Signed)
Summary: INCREASED ABD DISCOMFORT/YF    History of Present Illness Visit Type: Initial Visit Primary GI MD: Sheryn Bison MD FACP FAGA Primary Provider: Joaquin Courts  MD Chief Complaint: Rectal bleeding( blood in toilet), some abdominal discomfort also History of Present Illness:   This patient is a 59 year old white female former patient of Dr. Victorino Dike currently under the care of Dr. Joaquin Courts who has an extensive and complicated chart which took approximately 30 minutes for review. She has multiple medical problems and is on 20 different medications that are also reviewed and listed.  Basically she is an obese diabetic female with chronic pain syndrome, hypertensive cardiovascular disease, status post cholecystectomy, and has a diabetic neuropathy and chronic fibromyalgia. She presents to the office today with a history of periodic rectal bleeding and rectal pain for the last 2 weeks, left lower quadrant pain, abdominal gas and bloating, recurrent acid reflux symptoms despite taking daily omeprazole 20 mg, and progressive solid food dysphagia. She is chronically anticoagulated with Coumadin because of a history of atrial fibrillation. She also complains of hard stools but because the bathroom several times a day. Her rectal pain consists of some tenesmus but also a tearing sensation in her rectum. She has had previous endoscopy and colonoscopy exams by Dr. Corinda Gubler done several years ago. She denies any hepatobiliary complaints, abuse of NSAIDs, cigarettes or alcohol otherwise. She does have chronic lower dysfunction, hyperlipidemia, and chronic depression. Talking with her regular however her chart her problems took greater than one hour time . She additionally has had mildly elevated liver function test with serum transaminases approximately twice normal. She gives no history of hepatitis or pancreatitis or family history of liver disease. She denies mandibular complaints. Abdominal  ultrasound was completed on April 7 showed diffuse fatty infiltration of the liver, gallbladder surgically absent the pancreas was not visualized. Kidneys and spleen appear normal. Patient denies illicit drug use or transfusions.   GI Review of Systems    Reports abdominal pain, acid reflux, belching, bloating, dysphagia with solids, heartburn, and  nausea.     Location of  Abdominal pain: lower abdomen.     Reports constipation, hemorrhoids, irritable bowel syndrome, rectal bleeding, and  rectal pain.     Denies anal fissure, black tarry stools, change in bowel habit, diarrhea, diverticulosis, fecal incontinence, heme positive stool, jaundice, light color stool, and  liver problems.    Current Medications (verified): 1)  Metformin Hcl 1000 Mg Tabs (Metformin Hcl) .... Take 1 Tablet By Mouth Two Times A Day 2)  Glipizide 10 Mg  Tabs (Glipizide) .... One  Tab Two Times A Day 3)  Diltiazem Hcl Cr 180 Mg Cp24 (Diltiazem Hcl) .... Take 1 Tablet By Mouth Once A Day 4)  Darvocet-N 100 100-650 Mg Tabs (Propoxyphene N-Apap) .... Take 1 Tablet By Mouth Every 4 Hours As Needed For Pain 5)  Warfarin Sodium 5 Mg Tabs (Warfarin Sodium) .... Take As Directed. 6)  Lexapro 10 Mg Tabs (Escitalopram Oxalate) .... Take 1 Tablet By Mouth Once A Day 7)  Atenolol 25 Mg Tabs (Atenolol) .... Take 1 Tablet By Mouth Two Times A Day 8)  Omeprazole 20 Mg Cpdr (Omeprazole) .... Take 1 Tablet By Mouth Once A Day 9)  Calcium 600/vitamin D 600-400 Mg-Unit Tabs (Calcium Carbonate-Vitamin D) .... Take 1 Tablet By Mouth Two Times A Day 10)  Multivitamins Tabs (Multiple Vitamin) .... Take 1 Tablet By Mouth Once A Day 11)  Levothyroxine Sodium 75 Mcg Tabs (Levothyroxine Sodium) .Marland KitchenMarland KitchenMarland Kitchen  Take 1 Tablet By Mouth Once A Day 12)  Lasix 40 Mg Tabs (Furosemide) .... Take 1 Tablet By Mouth Once A Day 13)  Klor-Con 10 10 Meq Tbcr (Potassium Chloride) .... Take 1 Tablet By Mouth Two Times A Day 14)  Digitek 0.25 Mg Tabs (Digoxin) .... Take 1  Tablet By Mouth Once A Day 15)  Amitriptyline Hcl 50 Mg  Tabs (Amitriptyline Hcl) .... Take 1 Tablet By Mouth At Bedtime 16)  Fish Oil 1000 Mg Caps (Omega-3 Fatty Acids) .... Take 1 Tablet By Mouth Twice A Day 17)  Relion Blood Glucose Test   Strp (Glucose Blood) .... To Test Blood Sugar Twice Daily 18)  Lancets   Misc (Lancets) .... To Test Blood Sugar Twice Daily 19)  Lisinopril 10 Mg  Tabs (Lisinopril) .... Take 1 Tablet By Mouth Once A Day 20)  Pravachol 20 Mg Tabs (Pravastatin Sodium) .... Take 1/2 Tab By Mouth Before Bedtime and Increase To A Full Tab At Bedtime After One Week.  Allergies (verified): 1)  ! Gemfibrozil (Gemfibrozil) 2)  ! Penicillin 3)  ! Codeine 4)  ! * Latex  Past History:  Past medical, surgical, family and social histories (including risk factors) reviewed for relevance to current acute and chronic problems.  Past Medical History: Atrial fibrillation Diabetes mellitus, type II Diverticulosis, colon Hypertension Hypothyroidism Osteoarthritis Esophageal stricture, hx of Hiatal hernia, hx of, s/p Cholecystitis, hx of, s/p cholecystectomy Obstructive sleep apnea Sarcoidosis Diabetic neuropathy Atypical chest pain, hx of Benign right breast mass, hx of (papilloma) Diabetic Neuropathy Fibromyalgia  Past Surgical History: Hiatal hernia repair Cholecystectomy Esophageal dilation x 2 Hysterectomy Excision of milk duct of right breast -> benign (1/08) Hernia Surgery(femoral)  Family History: Reviewed history from 12/15/2007 and no changes required. Maternal uncle w/ dementia.   Melenoma: Father Family History of Colitis/Crohn's:Mother Family History of Diabetes: Father & Sister Family History of Heart Disease: Father  Social History: Reviewed history from 04/05/2006 and no changes required. Never Smoked Alcohol use-no Drug use-no Divorced On disability  Review of Systems       The patient complains of allergy/sinus, arthritis/joint pain,  back pain, depression-new, and fatigue.  The patient denies anemia, anxiety-new, blood in urine, breast changes/lumps, change in vision, confusion, cough, coughing up blood, fainting, fever, headaches-new, hearing problems, heart murmur, heart rhythm changes, itching, menstrual pain, muscle pains/cramps, night sweats, nosebleeds, pregnancy symptoms, shortness of breath, skin rash, sleeping problems, sore throat, swelling of feet/legs, swollen lymph glands, thirst - excessive , urination - excessive , urination changes/pain, urine leakage, vision changes, and voice change.         she is chronically on Darvocet 4-6 pills a day, amitriptyline 50 mg at bedtime, and overriding of diabetic and antihypertensive medications. General:  Complains of weakness and malaise; denies fever, chills, sweats, anorexia, fatigue, weight loss, and sleep disorder. ENT:  Denies earache, ear discharge, tinnitus, decreased hearing, nasal congestion, loss of smell, nosebleeds, sore throat, hoarseness, and difficulty swallowing. CV:  Complains of palpitations and dyspnea on exertion; denies chest pains, angina, syncope, orthopnea, PND, peripheral edema, and claudication. Resp:  Complains of dyspnea with exercise; denies dyspnea at rest, cough, sputum, wheezing, coughing up blood, and pleurisy. GI:  Complains of difficulty swallowing, indigestion/heartburn, gas/bloating, and constipation; denies pain on swallowing, nausea, vomiting, vomiting blood, abdominal pain, jaundice, diarrhea, change in bowel habits, bloody BM's, black BMs, and fecal incontinence. GU:  Complains of urinary frequency and pelvic pain; denies urinary burning, blood in urine, nocturnal urination, urinary  incontinence, abnormal vaginal bleeding, amenorrhea, menorrhagia, vaginal discharge, genital sores, painful intercourse, and decreased libido. MS:  Complains of joint swelling, joint stiffness, low back pain, muscle cramps, and leg pain at night; denies joint pain  / LOM, joint deformity, muscle weakness, muscle atrophy, leg pain with exertion, and shoulder pain / LOM hand / wrist pain (CTS). Derm:  Complains of itching; denies rash, dry skin, hives, moles, warts, and unhealing ulcers. Neuro:  Complains of seizures, difficulty walking, and radiculopathy other:; denies weakness, paralysis, abnormal sensation, syncope, tremors, vertigo, transient blindness, frequent falls, frequent headaches, headache, sciatica, restless legs, memory loss, and confusion. Psych:  Complains of depression and anxiety; denies memory loss, suicidal ideation, hallucinations, paranoia, phobia, and confusion. Endo:  Complains of polyuria; denies cold intolerance, heat intolerance, polydipsia, polyphagia, unusual weight change, and hirsutism. Heme:  Denies bruising, bleeding, enlarged lymph nodes, and pagophagia. Allergy:  Complains of hay fever and recurrent infections; denies hives, rash, and sneezing.  Vital Signs:  Patient profile:   60 year old female Height:      63 inches Weight:      198.38 pounds BMI:     35.27 Pulse rate:   72 / minute Pulse rhythm:   regular BP sitting:   108 / 66  (left arm) Cuff size:   regular  Vitals Entered By: June McMurray CMA (September 14, 2008 8:57 AM)  Physical Exam  General:  Well developed, well nourished, no acute distress.healthy appearing and obese.   Head:  Normocephalic and atraumatic. Eyes:  PERRLA, no icterus.exam deferred to patient's ophthalmologist.   Neck:  Supple; no masses or thyromegaly. Lungs:  Clear throughout to auscultation. Heart:  irregular rhythm:.  I cannot appreciate murmurs gallops or rubs. Abdomen:  Soft, nontender and nondistended. No masses, hepatosplenomegaly or hernias noted. Normal bowel sounds.obese.   Rectal:  Normal exam.hemocult negative.     Impression & Recommendations:  Problem # 1:  RECTAL BLEEDING (ICD-569.3) Assessment New her rectal exam is unremarkable and stool was guaiac-negative. I  suspect she has internal hemorrhoidal bleeding as she is due for followup colonoscopy exam which has been scheduled with adjustment in her diabetic medications and will hold her Coumadin for 5 days unless otherwise advised by her physicians. I have asked her to follow a high-fiber diet with daily fiber supplements and to use p.r.n. Anusol-HC suppositories. She does have a known history of diverticulosis coli. Orders: TLB-B12, Serum-Total ONLY ([address]-U04) TLB-Ferritin (82728-FER) TLB-Folic Acid (Folate) (82746-FOL) TLB-IBC Pnl (Iron/FE;Transferrin) (83550-IBC) TLB-Sedimentation Rate (ESR) (85652-ESR) T-AMA (54098-11914) T-ANA [phone]) T-Anti SMA (86578-46962) T-Ceruloplasmin [id number]) T-Hepatitis B Surface Antigen [phone]) T-Hepatitis C Anti HCV (44034) T-Alpha-Fetoprotein Serum (74259-56387) T-Sprue Panel (Celiac Disease Aby Eval) (83516x3/[address])  Problem # 2:  INTERNAL HEMORRHOIDS (ICD-455.0) Assessment: Unchanged  Orders: TLB-B12, Serum-Total ONLY (56433-I95) TLB-Ferritin (82728-FER) TLB-Folic Acid (Folate) (82746-FOL) TLB-IBC Pnl (Iron/FE;Transferrin) (83550-IBC) TLB-Sedimentation Rate (ESR) (85652-ESR) T-AMA (18841-66063) T-ANA [phone]) T-Anti SMA (55732-20254) T-Ceruloplasmin [phone]) T-Hepatitis B Surface Antigen [phone]) T-Hepatitis C Anti HCV (37106) T-Alpha-Fetoprotein Serum (26948-54627) T-Sprue Panel (Celiac Disease Aby Eval) (83516x3/[address])  Problem # 3:  GERD (ICD-530.81) Assessment: Deteriorated endoscopy That has been ordered and she may need stronger PPI therapy. Her dysphasia sounds like a recurrent peptic stricture versus soft dressing she probably will need esophageal dilatation. Again, we'll hold her Coumadin 5 days before this procedure since her atrial fib seems to be well controlled and she has not had a history of previous CVAs or embolic problems. 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 This is associated with her obesity, diabetes, and hyperlipidemia. I will complete her hepatic workup with screening metabolic and bile parameters. I doubt weight losses no achievable goal in this particular patient. 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  Problem # 5:  MILD COGNITIVE IMPAIRMENT SO STATED (ICD-331.83) Assessment: Unchanged  Problem # 6:  DIABETIC PERIPHERAL NEUROPATHY (ICD-250.60) Assessment: Unchanged Continue pain control per Dr. Andrey Campanile. She is on amitriptyline and Darvocet-N 100 and it is unclear she has tried gabapentin.  Problem # 7:  FATIGUE (ICD-780.79) Assessment: Unchanged  Problem # 8:  CHOLECYSTECTOMY, HX OF (ICD-V45.79) Assessment: Unchanged  Problem # 9:  DIABETES MELLITUS, TYPE II (ICD-250.00) Assessment: Improved continue at that meds per Dr. Andrey Campanile Orders: TLB-B12, Serum-Total ONLY (25956-L87) TLB-Ferritin (82728-FER) TLB-Folic Acid (Folate) (82746-FOL) TLB-IBC Pnl (Iron/FE;Transferrin) (83550-IBC) TLB-Sedimentation Rate (ESR) (85652-ESR) T-AMA (56433-29518) T-ANA [phone]) T-Anti SMA (60109-32355) T-Ceruloplasmin ([phone]) T-Hepatitis B Surface Antigen [phone]) T-Hepatitis C Anti HCV (51761) T-Alpha-Fetoprotein Serum (60737-10626) T-Sprue Panel (Celiac Disease Aby Eval)  (83516x3/[address])  Problem # 10:  FIBROMYALGIA, SEVERE (ICD-729.1) Assessment: Unchanged continued amitriptyline and Lexapro per Dr. Andrey Campanile  Patient Instructions: 1)  Copy sent to : Dr. Joaquin Courts and Dr. Rollene Rotunda in cardiology 2)  Please continue current medications.  3)  Colonoscopy and Flexible Sigmoidoscopy brochure given.  4)  Conscious Sedation brochure given.  5)  Upper Endoscopy with Dilatation brochure given.  6)  Diet should be high in fiber ( fruits, vegetables, whole grains) but low in residue. Drink at least eight (8) glasses of water a day. 7)  Local anal tear and suppositories 8)  Benefiber 1 tablespoon with cereal. 9)  Adjustments in diabetic medications for procedures and will hold Coumadin 5 days before hand unless otherwise advised by cardiology.   Appended Document: Orders Update    Clinical Lists Changes  Medications: Added new medication of MOVIPREP 100 GM  SOLR (PEG-KCL-NACL-NASULF-NA ASC-C) As per prep instructions. - Signed Added new medication of ANUSOL-HC 25 MG  SUPP (HYDROCORTISONE ACETATE) insert one supp into rectum once a day - Signed Rx of MOVIPREP 100 GM  SOLR (PEG-KCL-NACL-NASULF-NA ASC-C) As per prep instructions.;  #1 x 0;  Signed;  Entered by: Harlow Mares CMA;  Authorized by: Mardella Layman MD [facility];  Method used: [facility] to [facility] [address] [place], [place], [place]  [address], Ph: [phone], Fax: [phone] Rx of ANUSOL-HC 25 MG  SUPP (HYDROCORTISONE ACETATE) insert one supp into rectum once a day;  #30 x 0;  Signed;  Entered by: Harlow Mares CMA;  Authorized by: Mardella Layman MD [facility];  Method used: [facility] to [facility]*, [address], [place], [place], [place]  [address], Ph: [phone], Fax: [phone] Orders: Added new Test order of Colon/Endo (Colon/Endo) - Signed    Prescriptions: ANUSOL-HC 25 MG  SUPP (HYDROCORTISONE  ACETATE) insert one supp into rectum once a day  #30 x 0   Entered by:   Harlow Mares CMA   Authorized by:   Mardella Layman MD [facility]   Signed by:  Harlow Mares CMA on 09/14/2008   Method used:   Electronically to        [facility] (retail)       [address]       [place], [place]  [address]       Ph: [phone]       Fax: [phone]   RxID:   [id number] MOVIPREP 100 GM  SOLR (PEG-KCL-NACL-NASULF-NA ASC-C) As per prep instructions.  #1 x 0   Entered by:   Harlow Mares CMA   Authorized by:   Mardella Layman MD FACG,FAGA   Signed by:   Harlow Mares CMA on 09/14/2008   Method used:   Electronically to        [facility] (retail)       [address]       [place], [place]  [address]       Ph: [phone]       Fax: [phone]   RxID:   [id number]

## 2010-04-08 NOTE — Medication Information (Signed)
Summary: rov/tm  Anticoagulant Therapy  Managed by: Lew Dawes, PharmD Candidate Referring MD: Rollene Rotunda MD PCP: Joaquin Courts  MD Supervising MD: Jens Som MD, Arlys John Indication 1: Atrial Fibrillation (ICD-427.31) Lab Used: [facility] Site: [facility] INR POC 2.6 INR RANGE 2-3  Dietary changes: no    Health status changes: no    Bleeding/hemorrhagic complications: yes       Details: Patient having nosebleeds within past 2-3 months.  Recent/future hospitalizations: no    Any changes in medication regimen? no    Recent/future dental: yes     Details: Root canal scheduled for Wednesday 1/19.  Any missed doses?: no       Is patient compliant with meds? yes       Allergies: 1)  ! Gemfibrozil (Gemfibrozil) 2)  ! Penicillin 3)  ! Codeine 4)  ! * Latex  Anticoagulation Management History:      The patient is taking warfarin and comes in today for a routine follow up visit.  Positive risk factors for bleeding include history of CVA/TIA and presence of serious comorbidities.  Negative risk factors for bleeding include an age less than 16 years old.  The bleeding index is 'intermediate risk'.  Positive CHADS2 values include History of HTN, History of Diabetes, and Prior Stroke/CVA/TIA.  Negative CHADS2 values include Age > 30 years old.  The start date was 07/22/2004.  Her last INR was 1.6.  Anticoagulation responsible provider: Jens Som MD, Arlys John.  INR POC: 2.6.  Cuvette Lot#: [id number].  Exp: 06/2010.    Anticoagulation Management Assessment/Plan:      The patient's current anticoagulation dose is Warfarin sodium 5 mg tabs: Take as directed..  The target INR is 2 - 3.  The next INR is due 04/22/2009.  Anticoagulation instructions were given to patient.  Results were reviewed/authorized by Lew Dawes, PharmD Candidate.  She was notified by Lew Dawes, PharmD Candidate.         Prior Anticoagulation Instructions: The patient is to continue with the same dose of coumadin.   This dosage includes:  1 tab daily except 1/2 tab on SaturdayTake as directed by Coumadin Clinic.  Current Anticoagulation Instructions: INR 2.6  Continue same dose of 1 tablet daily except 0.5 tablet on Saturdays. Recheck in 4 weeks.

## 2010-04-08 NOTE — Progress Notes (Signed)
Summary: med refill/gp  Phone Note Refill Request Message from:  Fax from Pharmacy on October 09, 2009 2:42 PM  Requst refill Amitryptyline HCL 50mg  - take 1 tablet daily at bedtime. Pt. states she is taking this med.  Looks like it was stopped  March 2011.   Method Requested: Electronic Initial call taken by: Chinita Pester RN,  October 09, 2009 2:44 PM  Follow-up for Phone Call        Was she taking this med for her neuropathy? I will give this med if she has neuropathy but she needs to come into the clinic for evaluation before it can be given.  Follow-up by: Melida Quitter MD,  October 10, 2009 1:57 PM  Additional Follow-up for Phone Call Additional follow up Details #1::        Pt. was called; message left. Additional Follow-up by: Chinita Pester RN,  October 10, 2009 2:06 PM    Additional Follow-up for Phone Call Additional follow up Details #2::    Pt. states she is taking Amitriptyline for neuropathy. Follow-up by: Chinita Pester RN,  October 15, 2009 3:53 PM  Additional Follow-up for Phone Call Additional follow up Details #3:: Details for Additional Follow-up Action Taken: Will give her one month supply, please have her schedule an appt.  Additional Follow-up by: Melida Quitter MD,  October 16, 2009 10:32 AM  New/Updated Medications: AMITRIPTYLINE HCL 50 MG TABS (AMITRIPTYLINE HCL) Take 1 tab by mouth at bedtime Prescriptions: AMITRIPTYLINE HCL 50 MG TABS (AMITRIPTYLINE HCL) Take 1 tab by mouth at bedtime  #31 x 0   Entered and Authorized by:   Melida Quitter MD   Signed by:   Melida Quitter MD on 10/16/2009   Method used:   Electronically to        [facility] (retail)       [address]       [place], [place]  [address]       Ph: [phone]       Fax: [phone]   RxID:   [id number]    Flag sent to [place] for an appt. Chinita Pester RN  October 17, 2009 9:50 AM

## 2010-04-08 NOTE — Progress Notes (Signed)
Summary: med refill/wl  Phone Note Refill Request Message from:  Fax from Pharmacy on December 06, 2006 3:40 PM  Refills Requested: Medication #1:  LEVOTHYROXINE SODIUM 75 MCG TABS Take 1 tablet by mouth once a day   Last Refilled: 10/26/2006   Notes: TSH 1.022 10/27/06.  Method Requested: Fax to Local Pharmacy Initial call taken by: Dorene Sorrow RN,  December 06, 2006 3:41 PM  Follow-up for Phone Call        Refill approved-nurse to complete Follow-up by: Ulyess Mort MD,  December 06, 2006 3:50 PM  Additional Follow-up for Phone Call Additional follow up Details #1::        Rx faxed to pharmacy Additional Follow-up by: Dorene Sorrow RN,  December 06, 2006 4:05 PM      Prescriptions: LEVOTHYROXINE SODIUM 75 MCG TABS (LEVOTHYROXINE SODIUM) Take 1 tablet by mouth once a day  #31 x 3   Entered and Authorized by:   Ulyess Mort MD   Signed by:   Ulyess Mort MD on 12/06/2006   Method used:   Telephoned to ...       [facility]       [address]       [place], [place]  [address]       Ph: [phone]       Fax: [phone]   RxID:   [id number]

## 2010-04-08 NOTE — Progress Notes (Signed)
  Phone Note Call from Patient   Caller: Patient Summary of Call: Niaspan CR > $200 but after speaking w/ the pharmacist, she can get regular niacin for < $10 a month.  I called her and told her about this and that the major side effect is facial flushing.  She will start this and we will recheck lipids in 6-8 weeks.    New/Updated Medications: NIACIN 500 MG TABS (NIACIN) Two tabs by mouth twice a day. Prescriptions: NIACIN 500 MG TABS (NIACIN) Two tabs by mouth twice a day.  #120 x 6   Entered and Authorized by:   Joaquin Courts  MD   Signed by:   Joaquin Courts  MD on 05/22/2009   Method used:   Electronically to        [facility] (retail)       [address]       [place], [place]  [address]       Ph: [phone]       Fax: [phone]   RxID:   [id number]

## 2010-04-08 NOTE — Miscellaneous (Signed)
Summary: Orders Update  Clinical Lists Changes  Medications: Rx of WARFARIN SODIUM 5 MG TABS (WARFARIN SODIUM) Take as directed.;  #30 x 3;  Signed;  Entered by: Ollen Gross, RN, BSN;  Authorized by: Rollene Rotunda, MD, [facility];  Method used: [facility]ically to [facility]*, [address] [place], [place], [place]  [address], Ph: [phone], Fax: [phone] Orders: Added new Referral order of Nuclear Stress Test (Nuc Stress Test) - Signed    Prescriptions: WARFARIN SODIUM 5 MG TABS (WARFARIN SODIUM) Take as directed.  #30 x 3   Entered by:   Ollen Gross, RN, BSN   Authorized by:   Rollene Rotunda, MD, [facility]   Signed by:   Ollen Gross, RN, BSN on 05/28/2009   Method used:   Electronically to        [facility]* (retail)       [address]       [place], [place]  [address]       Ph: [phone]       Fax: [phone]   RxID:   [id number]

## 2010-04-08 NOTE — Progress Notes (Signed)
Summary: med refill/gp  Phone Note Refill Request Message from:  Fax from Pharmacy on Jul 26, 2009 11:22 AM  Refills Requested: Medication #1:  PRAVACHOL 40 MG TABS Two tablets by mouth once daily.   Last Refilled: 06/01/2009  Method Requested: Electronic Initial call taken by: Chinita Pester RN,  Jul 26, 2009 11:22 AM    Prescriptions: PRAVACHOL 40 MG TABS (PRAVASTATIN SODIUM) Two tablets by mouth once daily.  #60 x 6   Entered and Authorized by:   Joaquin Courts  MD   Signed by:   Joaquin Courts  MD on 07/29/2009   Method used:   Electronically to        [facility] (retail)       [address]       [place], [place]  [address]       Ph: [phone]       Fax: [phone]   RxID:   [id number]

## 2010-04-08 NOTE — Progress Notes (Signed)
Summary: med refill/gp  Phone Note Refill Request Message from:  Fax from Pharmacy on Jul 26, 2009 10:56 AM  Refills Requested: Medication #1:  DILTIAZEM HCL CR 180 MG CP24 Take 1 tablet by mouth once a day   Last Refilled: 07/25/2009  Method Requested: Electronic Initial call taken by: Chinita Pester RN,  Jul 26, 2009 10:56 AM    Prescriptions: DILTIAZEM HCL CR 180 MG CP24 (DILTIAZEM HCL) Take 1 tablet by mouth once a day  #30 x 6   Entered and Authorized by:   Joaquin Courts  MD   Signed by:   Joaquin Courts  MD on 07/26/2009   Method used:   Electronically to        [facility]* (retail)       [address]       [place], [place]  [address]       Ph: [phone]       Fax: [phone]   RxID:   [id number]

## 2010-04-08 NOTE — Progress Notes (Signed)
Summary: refill/ hla  Phone Note Refill Request Message from:  Fax from Pharmacy on September 12, 2007 11:52 AM  Refills Requested: Medication #1:  KLOR-CON 10 10 MEQ TBCR Take 1 tablet by mouth two times a day   Last Refilled: 6/10 Initial call taken by: Marin Roberts RN,  September 12, 2007 11:52 AM  Follow-up for Phone Call        Refilled electronically.  Follow-up by: Margarito Liner MD,  September 12, 2007 1:05 PM      Prescriptions: KLOR-CON 10 10 MEQ TBCR (POTASSIUM CHLORIDE) Take 1 tablet by mouth two times a day  #62 x 2   Entered and Authorized by:   Margarito Liner MD   Signed by:   Margarito Liner MD on 09/12/2007   Method used:   Electronically sent to ...       [facility]       [address]       [place], [place]  [address]       Ph: [phone]       Fax: [phone]   RxID:   [id number]

## 2010-04-08 NOTE — Letter (Signed)
Summary: Foye Spurling Mini-Mental Exam  Folstein Mini-Mental Exam   Imported By: Florinda Marker 12/20/2007 14:26:00  _____________________________________________________________________  External Attachment:    Type:   Image     Comment:   External Document

## 2010-04-08 NOTE — Miscellaneous (Signed)
  Clinical Lists Changes  Problems: Added new problem of TRANSAMINASES, SERUM, ELEVATED (ICD-790.4) Orders: Added new Test order of T-Comprehensive Metabolic Panel [phone]) - Signed

## 2010-04-08 NOTE — Letter (Signed)
Summary: ABC Sheet  ABC Sheet   Imported By: Florinda Marker 07/03/2008 11:17:26  _____________________________________________________________________  External Attachment:    Type:   Image     Comment:   External Document

## 2010-04-08 NOTE — Progress Notes (Signed)
Summary: med refill/wl  Phone Note Refill Request Message from:  Fax from Pharmacy on October 11, 2006 11:25 AM  Refills Requested: Medication #1:  AMITRIPTYLINE HCL 25 MG TABS (AMITRIPTYLINE HCL) Take 1 tablet by mouth once a day at bedtime   Last Refilled: 10/03/2006   Notes: Fax is for 50mg  tablet, take one at bedtime.  Method Requested: Fax to Local Pharmacy Initial call taken by: Dorene Sorrow RN,  October 11, 2006 11:27 AM  Follow-up for Phone Call        Who prescribed the 50 mg dose? ..................................................................Marland KitchenMargarito Liner MD  October 11, 2006 11:32 AM   Dr. Ardyth Harps per the patient's 12/21/05 visit.  Paper chart pulled and placed in attending box.  Pharmacist states patient has received 50mg  dose since 01/04/06. Follow-up by: Dorene Sorrow RN,  October 11, 2006 3:49 PM  Additional Follow-up for Phone Call Additional follow up Details #1::        Med list corrected to show the 50 mg dose. Refill approved - nurse to complete. ..................................................................Marland KitchenMargarito Liner MD  October 11, 2006 3:58 PM   Rx faxed to pharmacy  Additional Follow-up by: Dorene Sorrow RN,  October 11, 2006 4:31 PM    New/Updated Medications: AMITRIPTYLINE HCL 50 MG  TABS (AMITRIPTYLINE HCL) Take 1 tablet by mouth at bedtime   Prescriptions: AMITRIPTYLINE HCL 50 MG  TABS (AMITRIPTYLINE HCL) Take 1 tablet by mouth at bedtime  #30 x 2   Entered and Authorized by:   Margarito Liner MD   Signed by:   Margarito Liner MD on 10/11/2006   Method used:   Telephoned to ...       [facility]       [address]       [place], [place]  [address]       Ph: [phone]       Fax: [phone]   RxID:   [id number]

## 2010-04-08 NOTE — Medication Information (Signed)
Summary: rov/ewj  Medications Added VICODIN ES 7.5-750 MG TABS (HYDROCODONE-ACETAMINOPHEN) take 1 tab q4-6h as needed pain      Allergies Added:  Anticoagulant Therapy  Managed by: Bethanne Ginger, PharmD Referring MD: Rollene Rotunda MD PCP: Joaquin Courts  MD Supervising MD: Antoine Poche MD, Fayrene Fearing Indication 1: Atrial Fibrillation (ICD-427.31) Lab Used: [facility] Site: [facility] INR POC 2.2 INR RANGE 2-3  Dietary changes: no    Health status changes: no    Bleeding/hemorrhagic complications: yes       Details: 2 nose bleeds in the past month  Recent/future hospitalizations: no    Any changes in medication regimen? yes       Details: Received rx for vicodin today  Recent/future dental: no  Any missed doses?: yes     Details: Missed dose 12/16 - fell asleep and thought she had taken it  Is patient compliant with meds? yes       Current Medications (verified): 1)  Metformin Hcl 1000 Mg Tabs (Metformin Hcl) .... Take 1 Tablet By Mouth Two Times A Day 2)  Glipizide 10 Mg  Tabs (Glipizide) .... One  Tab Two Times A Day 3)  Diltiazem Hcl Cr 180 Mg Cp24 (Diltiazem Hcl) .... Take 1 Tablet By Mouth Once A Day 4)  Darvocet-N 100 100-650 Mg Tabs (Propoxyphene N-Apap) .... Take 1 Tablet By Mouth Every 4 Hours As Needed For Pain 5)  Warfarin Sodium 5 Mg Tabs (Warfarin Sodium) .... Take As Directed. 6)  Lexapro 10 Mg Tabs (Escitalopram Oxalate) .... Take 1 Tablet By Mouth Once A Day 7)  Atenolol 25 Mg Tabs (Atenolol) .... Take 1 Tablet By Mouth Two Times A Day 8)  Omeprazole 20 Mg Cpdr (Omeprazole) .... Take 1 Tablet By Mouth Once A Day 9)  Calcium 600/vitamin D 600-400 Mg-Unit Tabs (Calcium Carbonate-Vitamin D) .... Take 1 Tablet By Mouth Two Times A Day 10)  Multivitamins Tabs (Multiple Vitamin) .... Take 1 Tablet By Mouth Once A Day 11)  Levothyroxine Sodium 75 Mcg Tabs (Levothyroxine Sodium) .... Take 1 Tablet By Mouth Once A Day 12)  Lasix 40 Mg Tabs (Furosemide) .... Take 1  Tablet By Mouth Once A Day 13)  Klor-Con 10 10 Meq Tbcr (Potassium Chloride) .... Take 1 Tablet By Mouth Two Times A Day 14)  Digitek 0.25 Mg Tabs (Digoxin) .... Take 1 Tablet By Mouth Once A Day 15)  Amitriptyline Hcl 50 Mg  Tabs (Amitriptyline Hcl) .... Take 1 Tablet By Mouth At Bedtime 16)  Fish Oil 1000 Mg Caps (Omega-3 Fatty Acids) .... Take 1 Tablet By Mouth Twice A Day 17)  Relion Blood Glucose Test   Strp (Glucose Blood) .... To Test Blood Sugar Twice Daily 18)  Lancets   Misc (Lancets) .... To Test Blood Sugar Twice Daily 19)  Lisinopril 10 Mg  Tabs (Lisinopril) .... Take 1 Tablet By Mouth Once A Day 20)  Pravachol 20 Mg Tabs (Pravastatin Sodium) .... Take 1/2 Tab By Mouth Before Bedtime and Increase To A Full Tab At Bedtime After One Week. 21)  Anusol-Hc 25 Mg  Supp (Hydrocortisone Acetate) .... Insert One Supp Into Rectum Once A Day 22)  Tylenol Extra Strength 500 Mg Tabs (Acetaminophen) .... Take 1 Tablet By Mouth Every 6 Hours As Needed For Pain 23)  Vicodin Es 7.5-750 Mg Tabs (Hydrocodone-Acetaminophen) .... Take 1 Tab Q4-6h As Needed Pain  Allergies (verified): 1)  ! Gemfibrozil (Gemfibrozil) 2)  ! Penicillin 3)  ! Codeine 4)  ! * Latex  Anticoagulation Management History:      The patient is taking warfarin and comes in today for a routine follow up visit.  Positive risk factors for bleeding include history of CVA/TIA and presence of serious comorbidities.  Negative risk factors for bleeding include an age less than 66 years old.  The bleeding index is 'intermediate risk'.  Positive CHADS2 values include History of HTN, History of Diabetes, and Prior Stroke/CVA/TIA.  Negative CHADS2 values include Age > 74 years old.  The start date was 07/22/2004.  Her last INR was 1.6.  Anticoagulation responsible provider: Antoine Poche MD, Fayrene Fearing.  INR POC: 2.2.  Cuvette Lot#: [id number].  Exp: 04/2010.    Anticoagulation Management Assessment/Plan:      The patient's current anticoagulation dose is  Warfarin sodium 5 mg tabs: Take as directed..  The target INR is 2 - 3.  The next INR is due 03/25/2009.  Anticoagulation instructions were given to patient.  Results were reviewed/authorized by Bethanne Ginger, PharmD.  She was notified by Bethanne Ginger.         Prior Anticoagulation Instructions: INR 2.1  Continue on same dosage 1 tablet daily except 1/2 tablet on Saturdays.  Recheck in 4 weeks.    Current Anticoagulation Instructions: The patient is to continue with the same dose of coumadin.  This dosage includes:  1 tab daily except 1/2 tab on SaturdayTake as directed by [facility].

## 2010-04-08 NOTE — Medication Information (Signed)
Summary: rov/tm  Anticoagulant Therapy  Managed by: Cloyde Reams, RN, BSN Referring MD: Rollene Rotunda MD PCP: Joaquin Courts  MD Supervising MD: Antoine Poche MD, Fayrene Fearing Indication 1: Atrial Fibrillation (ICD-427.31) Lab Used: [facility] Site: [facility] INR POC 3.7 INR RANGE 2-3  Dietary changes: no    Health status changes: no    Bleeding/hemorrhagic complications: no    Recent/future hospitalizations: no    Any changes in medication regimen? no    Recent/future dental: no  Any missed doses?: no       Is patient compliant with meds? yes       Current Medications (verified): 1)  Metformin Hcl 1000 Mg Tabs (Metformin Hcl) .... Take 1 Tablet By Mouth Two Times A Day 2)  Glipizide 10 Mg  Tabs (Glipizide) .... One  Tab Two Times A Day 3)  Diltiazem Hcl Cr 180 Mg Cp24 (Diltiazem Hcl) .... Take 1 Tablet By Mouth Once A Day 4)  Darvocet-N 100 100-650 Mg Tabs (Propoxyphene N-Apap) .... Take 1 Tablet By Mouth Every 4 Hours As Needed For Pain 5)  Warfarin Sodium 5 Mg Tabs (Warfarin Sodium) .... Take As Directed. 6)  Lexapro 10 Mg Tabs (Escitalopram Oxalate) .... Take 1 Tablet By Mouth Once A Day 7)  Atenolol 25 Mg Tabs (Atenolol) .... Take 1 Tablet By Mouth Two Times A Day 8)  Omeprazole 20 Mg Cpdr (Omeprazole) .... Take 1 Tablet By Mouth Once A Day 9)  Calcium 600/vitamin D 600-400 Mg-Unit Tabs (Calcium Carbonate-Vitamin D) .... Take 1 Tablet By Mouth Two Times A Day 10)  Multivitamins Tabs (Multiple Vitamin) .... Take 1 Tablet By Mouth Once A Day 11)  Levothyroxine Sodium 75 Mcg Tabs (Levothyroxine Sodium) .... Take 1 Tablet By Mouth Once A Day 12)  Lasix 40 Mg Tabs (Furosemide) .... Take 1 Tablet By Mouth Once A Day 13)  Klor-Con 10 10 Meq Tbcr (Potassium Chloride) .... Take 1 Tablet By Mouth Two Times A Day 14)  Digitek 0.25 Mg Tabs (Digoxin) .... Take 1 Tablet By Mouth Once A Day 15)  Amitriptyline Hcl 50 Mg  Tabs (Amitriptyline Hcl) .... Take 1 Tablet By Mouth At  Bedtime 16)  Fish Oil 1000 Mg Caps (Omega-3 Fatty Acids) .... Take 1 Tablet By Mouth Twice A Day 17)  Relion Blood Glucose Test   Strp (Glucose Blood) .... To Test Blood Sugar Twice Daily 18)  Lancets   Misc (Lancets) .... To Test Blood Sugar Twice Daily 19)  Lisinopril 10 Mg  Tabs (Lisinopril) .... Take 1 Tablet By Mouth Once A Day 20)  Pravachol 20 Mg Tabs (Pravastatin Sodium) .... Take 1/2 Tab By Mouth Before Bedtime and Increase To A Full Tab At Bedtime After One Week. 21)  Anusol-Hc 25 Mg  Supp (Hydrocortisone Acetate) .... Insert One Supp Into Rectum Once A Day  Allergies (verified): 1)  ! Gemfibrozil (Gemfibrozil) 2)  ! Penicillin 3)  ! Codeine 4)  ! * Latex  Anticoagulation Management History:      The patient is taking warfarin and comes in today for a routine follow up visit.  Positive risk factors for bleeding include history of CVA/TIA and presence of serious comorbidities.  Negative risk factors for bleeding include an age less than 106 years old.  The bleeding index is 'intermediate risk'.  Positive CHADS2 values include History of HTN, History of Diabetes, and Prior Stroke/CVA/TIA.  Negative CHADS2 values include Age > 77 years old.  The start date was 07/22/2004.  Her last INR  was 1.6.  Anticoagulation responsible provider: Antoine Poche MD, Fayrene Fearing.  INR POC: 3.7.  Cuvette Lot#: [id number].  Exp: 12/2009.    Anticoagulation Management Assessment/Plan:      The patient's current anticoagulation dose is Warfarin sodium 5 mg tabs: Take as directed..  The target INR is 2 - 3.  The next INR is due 11/27/2008.  Anticoagulation instructions were given to patient.  Results were reviewed/authorized by Cloyde Reams, RN, BSN.  She was notified by Cloyde Reams RN.         Prior Anticoagulation Instructions: INR today is 1.6 Take 7.5mg s today and tomorow, then continue on the same regimen. Recheck in 2 weeks    Current Anticoagulation Instructions: INR 3.7  Skip today's dose of coumadin.   Then resume 1 tablet daily except 1/2 tablet on Saturdays.  Recheck in 3 weeks.

## 2010-04-08 NOTE — Medication Information (Signed)
Summary: CCR  Anticoagulant Therapy  Managed by: Cloyde Reams, RN, BSN Referring MD: Rollene Rotunda MD PCP: Joaquin Courts  MD Supervising MD: Eden Emms MD, Theron Arista Indication 1: Atrial Fibrillation (ICD-427.31) Lab Used: [facility] Site: [facility] INR POC 2.1 INR RANGE 2-3  Dietary changes: no    Health status changes: no    Bleeding/hemorrhagic complications: yes       Details: Pt states she has had 2 nose bleeds 11/7 and 11/8, lasted approx 15 mins.    Recent/future hospitalizations: no    Any changes in medication regimen? yes       Details: Mucus relief prn.  Recent/future dental: no  Any missed doses?: no       Is patient compliant with meds? yes       Current Medications (verified): 1)  Metformin Hcl 1000 Mg Tabs (Metformin Hcl) .... Take 1 Tablet By Mouth Two Times A Day 2)  Glipizide 10 Mg  Tabs (Glipizide) .... One  Tab Two Times A Day 3)  Diltiazem Hcl Cr 180 Mg Cp24 (Diltiazem Hcl) .... Take 1 Tablet By Mouth Once A Day 4)  Darvocet-N 100 100-650 Mg Tabs (Propoxyphene N-Apap) .... Take 1 Tablet By Mouth Every 4 Hours As Needed For Pain 5)  Warfarin Sodium 5 Mg Tabs (Warfarin Sodium) .... Take As Directed. 6)  Lexapro 10 Mg Tabs (Escitalopram Oxalate) .... Take 1 Tablet By Mouth Once A Day 7)  Atenolol 25 Mg Tabs (Atenolol) .... Take 1 Tablet By Mouth Two Times A Day 8)  Omeprazole 20 Mg Cpdr (Omeprazole) .... Take 1 Tablet By Mouth Once A Day 9)  Calcium 600/vitamin D 600-400 Mg-Unit Tabs (Calcium Carbonate-Vitamin D) .... Take 1 Tablet By Mouth Two Times A Day 10)  Multivitamins Tabs (Multiple Vitamin) .... Take 1 Tablet By Mouth Once A Day 11)  Levothyroxine Sodium 75 Mcg Tabs (Levothyroxine Sodium) .... Take 1 Tablet By Mouth Once A Day 12)  Lasix 40 Mg Tabs (Furosemide) .... Take 1 Tablet By Mouth Once A Day 13)  Klor-Con 10 10 Meq Tbcr (Potassium Chloride) .... Take 1 Tablet By Mouth Two Times A Day 14)  Digitek 0.25 Mg Tabs (Digoxin) .... Take 1 Tablet  By Mouth Once A Day 15)  Amitriptyline Hcl 50 Mg  Tabs (Amitriptyline Hcl) .... Take 1 Tablet By Mouth At Bedtime 16)  Fish Oil 1000 Mg Caps (Omega-3 Fatty Acids) .... Take 1 Tablet By Mouth Twice A Day 17)  Relion Blood Glucose Test   Strp (Glucose Blood) .... To Test Blood Sugar Twice Daily 18)  Lancets   Misc (Lancets) .... To Test Blood Sugar Twice Daily 19)  Lisinopril 10 Mg  Tabs (Lisinopril) .... Take 1 Tablet By Mouth Once A Day 20)  Pravachol 20 Mg Tabs (Pravastatin Sodium) .... Take 1/2 Tab By Mouth Before Bedtime and Increase To A Full Tab At Bedtime After One Week. 21)  Anusol-Hc 25 Mg  Supp (Hydrocortisone Acetate) .... Insert One Supp Into Rectum Once A Day 22)  Tylenol Extra Strength 500 Mg Tabs (Acetaminophen) .... Take 1 Tablet By Mouth Every 6 Hours As Needed For Pain  Allergies (verified): 1)  ! Gemfibrozil (Gemfibrozil) 2)  ! Penicillin 3)  ! Codeine 4)  ! * Latex  Anticoagulation Management History:      The patient is taking warfarin and comes in today for a routine follow up visit.  Positive risk factors for bleeding include history of CVA/TIA and presence of serious comorbidities.  Negative risk  factors for bleeding include an age less than 64 years old.  The bleeding index is 'intermediate risk'.  Positive CHADS2 values include History of HTN, History of Diabetes, and Prior Stroke/CVA/TIA.  Negative CHADS2 values include Age > 41 years old.  The start date was 07/22/2004.  Her last INR was 1.6.  Anticoagulation responsible provider: Eden Emms MD, Theron Arista.  INR POC: 2.1.  Cuvette Lot#: [id number].  Exp: 12/2009.    Anticoagulation Management Assessment/Plan:      The patient's current anticoagulation dose is Warfarin sodium 5 mg tabs: Take as directed..  The target INR is 2 - 3.  The next INR is due 02/25/2009.  Anticoagulation instructions were given to patient.  Results were reviewed/authorized by Cloyde Reams, RN, BSN.  She was notified by Cloyde Reams, RN, BSN.          Prior Anticoagulation Instructions: INR 2.5  Continue taking 1 tablet (5 mg) daily, except for 1/2 tablet (2.5 mg) on Saturday.  Return to clinic in 4 weeks.  Current Anticoagulation Instructions: INR 2.1  Continue on same dosage 1 tablet daily except 1/2 tablet on Saturdays.  Recheck in 4 weeks.   Prescriptions: WARFARIN SODIUM 5 MG TABS (WARFARIN SODIUM) Take as directed.  #30 x 3   Entered by:   Bethena Midget, RN, BSN   Authorized by:   Rollene Rotunda, MD, [facility]   Signed by:   Bethena Midget, RN, BSN on 01/28/2009   Method used:   Electronically to        [facility] (retail)       [address]       [place], [place]  [address]       Ph: [phone]       Fax: [phone]   RxID:   [id number]

## 2010-04-08 NOTE — Assessment & Plan Note (Signed)
Summary: CHEST DISCOMFORT  Medications Added OMEPRAZOLE 20 MG CPDR (OMEPRAZOLE) Take 1 tablet by mouth once a day prn        Visit Type:  Follow-up Primary Provider:  Joaquin Courts  MD  CC:  chest pain.  History of Present Illness: The patient presents for followup of her atrial fibrillation and chest discomfort. She has a history of persistent atrial fibrillation. She does occasionally feel palpitations. She might get a little anxious with this. She will sit down and relax. Will ease off. She does not have any presyncope or syncope. She has also been having some chest discomfort. It is a pulling discomfort in her upper chest and mid chest. Seems to occur at rest. She thinks it is different than complaints she had at the time of a catheterization in 2004 and a stress test in August 2009. She does not describe associated nausea vomiting or diaphoresis. She does not describe radiation to her jaw or to her arms. It comes on and goes away spontaneously. Seems to be moderate in intensity.  Current Medications (verified): 1)  Metformin Hcl 1000 Mg Tabs (Metformin Hcl) .... Take 1 Tablet By Mouth Two Times A Day 2)  Glipizide 10 Mg  Tabs (Glipizide) .... One  Tab Two Times A Day 3)  Diltiazem Hcl Cr 180 Mg Cp24 (Diltiazem Hcl) .... Take 1 Tablet By Mouth Once A Day 4)  Darvocet-N 100 100-650 Mg Tabs (Propoxyphene N-Apap) .... Take 1 Tablet By Mouth Every 4 Hours As Needed For Pain 5)  Warfarin Sodium 5 Mg Tabs (Warfarin Sodium) .... Take As Directed. 6)  Lexapro 10 Mg Tabs (Escitalopram Oxalate) .... Take 1 Tablet By Mouth Once A Day 7)  Atenolol 25 Mg Tabs (Atenolol) .... Take 1 Tablet By Mouth Two Times A Day 8)  Omeprazole 20 Mg Cpdr (Omeprazole) .... Take 1 Tablet By Mouth Once A Day 9)  Calcium 600/vitamin D 600-400 Mg-Unit Tabs (Calcium Carbonate-Vitamin D) .... Take 1 Tablet By Mouth Two Times A Day 10)  Multivitamins Tabs (Multiple Vitamin) .... Take 1 Tablet By Mouth Once A Day 11)   Levothyroxine Sodium 75 Mcg Tabs (Levothyroxine Sodium) .... Take 1 Tablet By Mouth Once A Day 12)  Lasix 40 Mg Tabs (Furosemide) .... Take 1 Tablet By Mouth Once A Day 13)  Klor-Con 10 10 Meq Tbcr (Potassium Chloride) .... Take 1 Tablet By Mouth Two Times A Day 14)  Digitek 0.25 Mg Tabs (Digoxin) .... Take 1 Tablet By Mouth Once A Day 15)  Amitriptyline Hcl 50 Mg  Tabs (Amitriptyline Hcl) .... Take 1 Tablet By Mouth At Bedtime 16)  Fish Oil 1000 Mg Caps (Omega-3 Fatty Acids) .... Take 1 Tablet By Mouth Twice A Day 17)  Relion Blood Glucose Test   Strp (Glucose Blood) .... To Test Blood Sugar Twice Daily 18)  Lancets   Misc (Lancets) .... To Test Blood Sugar Twice Daily 19)  Lisinopril 10 Mg  Tabs (Lisinopril) .... Take 1 Tablet By Mouth Once A Day 20)  Pravachol 20 Mg Tabs (Pravastatin Sodium) .... Take 1/2 Tab By Mouth Before Bedtime and Increase To A Full Tab At Bedtime After One Week. 21)  Anusol-Hc 25 Mg  Supp (Hydrocortisone Acetate) .... Insert One Supp Into Rectum Once A Day 22)  Tylenol Extra Strength 500 Mg Tabs (Acetaminophen) .... Take 1 Tablet By Mouth Every 6 Hours As Needed For Pain 23)  Vicodin Es 7.5-750 Mg Tabs (Hydrocodone-Acetaminophen) .... Take 1 Tab Q4-6h As Needed Pain  Allergies: 1)  ! Gemfibrozil (Gemfibrozil) 2)  ! Penicillin 3)  ! Codeine 4)  ! * Latex  Past History:  Past Medical History: Reviewed history from 11/02/2008 and no changes required. Atrial fibrillation Diabetes mellitus, type II Diverticulosis, colon Hypertension Hypothyroidism Osteoarthritis Esophageal stricture, hx of Hiatal hernia, hx of, s/p Cholecystitis, hx of, s/p cholecystectomy Obstructive sleep apnea Sarcoidosis Diabetic neuropathy Atypical chest pain, hx of Benign right breast mass, hx of (papilloma) Diabetic Neuropathy Fibromyalgia  Past Surgical History: Reviewed history from 11/02/2008 and no changes required. Hiatal hernia repair Cholecystectomy Esophageal dilation  x 2 Hysterectomy Excision of milk duct of right breast -> benign (1/08) Hernia Surgery(femoral)  Review of Systems       Positive for foot pain.  Otherwise as stated in the HPI and negative for all other systems.   Vital Signs:  Patient profile:   60 year old female Menstrual status:  postmenopausal Height:      63 inches Weight:      210 pounds BMI:     37.33 Pulse rate:   81 / minute BP sitting:   110 / 74  (left arm) Cuff size:   large  Vitals Entered By: Oswald Hillock (March 26, 2009 3:56 PM)  Physical Exam  General:  Well developed, well nourished, in no acute distress. Head:  normocephalic and atraumatic Eyes:  PERRLA/EOM intact; conjunctiva and lids normal. Mouth:  Teeth, gums and palate normal. Oral mucosa normal. Neck:  Neck supple, no JVD. No masses, thyromegaly or abnormal cervical nodes. Chest Wall:  no deformities or breast masses noted Lungs:  Clear bilaterally to auscultation and percussion. Abdomen:  Bowel sounds positive; abdomen soft and non-tender without masses, organomegaly, or hernias noted. No hepatosplenomegaly, obese Msk:  Back normal, normal gait. Muscle strength and tone normal. Extremities:  No clubbing or cyanosis. Neurologic:  Alert and oriented x 3. Skin:  Intact without lesions or rashes. Cervical Nodes:  no significant adenopathy Axillary Nodes:  no significant adenopathy Inguinal Nodes:  no significant adenopathy Psych:  Normal affect.   Detailed Cardiovascular Exam  Neck    Carotids: Carotids full and equal bilaterally without bruits.      Neck Veins: Normal, no JVD.    Heart    Inspection: no deformities or lifts noted.      Palpation: normal PMI with no thrills palpable.      Auscultation: S1 and S2 within normal limits, no S3, no S4, no clicks, no rubs, no murmurs,irregular rhythm.    Vascular    Abdominal Aorta: no palpable masses, pulsations, or audible bruits.      Femoral Pulses: normal femoral pulses  bilaterally.      Pedal Pulses: normal pedal pulses bilaterally.      Radial Pulses: normal radial pulses bilaterally.      Peripheral Circulation: no clubbing, cyanosis, or edema noted with normal capillary refill.     EKG  Procedure date:  03/26/2009  Findings:      atrial fibrillation, rate 81, axis within normal limits, intervals within normal limits, no acute ST-T wave changes.  Impression & Recommendations:  Problem # 1:  ATRIAL FIBRILLATION (ICD-427.31) The patient has had some palpitations more than usual. I will place a 24-hour Holter monitor to make sure she still has adequate rate control. She will continue with Coumadin. Orders: EKG w/ Interpretation (93000) Holter (Holter) Treadmill (Treadmill)  Problem # 2:  HYPERTENSION (ICD-401.9) Her blood pressure is controlled she will continue the medicines as listed.  Problem #  3:  CHEST PAIN (ICD-786.50) I think this is atypical. However, she does have risk factors. I screening with an exercise treadmill test will be reasonable.  Patient Instructions: 1)  Your physician recommends that you schedule a follow-up appointment in: 6 months with Dr Antoine Poche 2)  Your physician recommends that you continue on your current medications as directed. Please refer to the Current Medication list given to you today. 3)  Your physician has requested that you have an exercise tolerance test.  For further information please visit [email or web address]  Please also follow instruction sheet, as given. 4)  Your physician has recommended that you wear a holter monitor.  Holter monitors are medical devices that record the heart's electrical activity. Doctors most often use these monitors to diagnose arrhythmias. Arrhythmias are problems with the speed or rhythm of the heartbeat. The monitor is a small, portable device. You can wear one while you do your normal daily activities. This is usually used to diagnose what is causing palpitations/syncope  (passing out). 5)  You have been diagnosed with atrial fibrillation.  Atrial fibrillation is a condition in which one of the upper chambers of the heart has extra electrical cells causing it to beat very fast.  Please see the handout/brochure given to you today for further information.

## 2010-04-08 NOTE — Medication Information (Signed)
Summary: rov/jk  Anticoagulant Therapy  Managed by: Eda Keys, PharmD Referring MD: Rollene Rotunda MD PCP: Melida Quitter MD Supervising MD: Tenny Craw MD, Gunnar Fusi Indication 1: Atrial Fibrillation (ICD-427.31) Lab Used: [facility] Site: [facility] INR POC 3.3 INR RANGE 2-3  Dietary changes: no    Health status changes: no    Bleeding/hemorrhagic complications: yes       Details: Pt has been noticing nose bleeds, one was particularly bad and bled for an hour.  In previous month patient has not had any of those.    Recent/future hospitalizations: no    Any changes in medication regimen? yes       Details: see updated med list  Recent/future dental: yes     Details: see note below regarding procedure  Any missed doses?: no       Is patient compliant with meds? yes      Comments: Pt is having throat stretched this wednesday, sept 14th by Dr. Jarold Motto of [place].  He said patient could remain on coumadin.    Allergies: 1)  ! Gemfibrozil (Gemfibrozil) 2)  ! Penicillin 3)  ! Codeine 4)  ! * Latex  Anticoagulation Management History:      The patient is taking warfarin and comes in today for a routine follow up visit.  Positive risk factors for bleeding include history of CVA/TIA and presence of serious comorbidities.  Negative risk factors for bleeding include an age less than 30 years old.  The bleeding index is 'intermediate risk'.  Positive CHADS2 values include History of HTN, History of Diabetes, and Prior Stroke/CVA/TIA.  Negative CHADS2 values include Age > 55 years old.  The start date was 07/22/2004.  Her last INR was 1.6.  Anticoagulation responsible provider: Tenny Craw MD, Gunnar Fusi.  INR POC: 3.3.  Cuvette Lot#: [id number].  Exp: 12/2010.    Anticoagulation Management Assessment/Plan:      The patient's current anticoagulation dose is Warfarin sodium 5 mg tabs: Take as directed..  The target INR is 2 - 3.  The next INR is due 11/29/2009.  Anticoagulation instructions were given  to patient.  Results were reviewed/authorized by Eda Keys, PharmD.  She was notified by Eda Keys.         Prior Anticoagulation Instructions: INR 3.1  Today, take 1/2 tablet (2.5mg ).  Then resume normal schedule of taking 1 tablet (5mg ) every day except take 1/2 tablet (2.5mg ) on Saturdays.  Recheck in 2 weeks.    Current Anticoagulation Instructions: INR 3.3  Do NOT take coumadin.  Then start NEW dosing schedule of 1/2 tablet on Tuesday and Saturday, and 1 tablet all other days.  Return to clinic in 2 weeks.

## 2010-04-08 NOTE — Progress Notes (Signed)
Summary: refill request/nls  Phone Note Refill Request  on January 13, 2007 10:17 AM  Refills Requested: Medication #1:  AMITRIPTYLINE HCL 50 MG  TABS Take 1 tablet by mouth at bedtime   Last Refilled: 12/06/2006  Method Requested: Fax to Local Pharmacy Initial call taken by: Concepcion Elk,  January 13, 2007 10:17 AM  Follow-up for Phone Call        Refill approved-nurse to complete Follow-up by: Peggye Pitt MD,  January 13, 2007 10:22 AM  Additional Follow-up for Phone Call Additional follow up Details #1::        Rx faxed to pharmacy Additional Follow-up by: Concepcion Elk,  January 13, 2007 10:39 AM      Prescriptions: AMITRIPTYLINE HCL 50 MG  TABS (AMITRIPTYLINE HCL) Take 1 tablet by mouth at bedtime  #31 x 5   Entered and Authorized by:   Peggye Pitt MD   Signed by:   Peggye Pitt MD on 01/13/2007   Method used:   Telephoned to ...       [facility]       [address]       [place], [place]  [address]       Ph: [phone]       Fax: [phone]   RxID:   [id number]

## 2010-04-08 NOTE — Assessment & Plan Note (Signed)
Summary: check up[ mkj]   Vital Signs:  Patient Profile:   60 Years Old Female Height:     63 inches (160.02 cm) Weight:      209.3 pounds (95.14 kg) BMI:     37.21 Temp:     97.4 degrees F (36.33 degrees C) oral Pulse rate:   88 / minute BP sitting:   116 / 65  (right arm)  Pt. in pain?   no  Vitals Entered By: Stanton Kidney Ditzler RN (March 13, 2008 3:47 PM)              Is Patient Diabetic? Yes Did you bring your meter with you today? No Nutritional Status BMI of > 30 = obese Nutritional Status Detail appetite good CBG Result 136  Have you ever been in a relationship where you felt threatened, hurt or afraid?denies   Does patient need assistance? Functional Status Self care Ambulation Normal     PCP:  Joaquin Courts  MD  Chief Complaint:  Ck-up and voiding in small amts with burning. ? skin ext tear - vag ?..  History of Present Illness: Pt is a 60 yo female w/ past medical history below in the chart here for f/u.  She is excited b/c her daughter recently had a baby and she spent the last 7 weeks in [place].  Her only complaint is dysuria and increased urinary frequency.  She tried monistat for yeast and that didn't help.  She does not have vaginal itching now but did previously.  Her symptoms have lasted over the last several weeks.  She also had a few episodes of small amount of incontinence.  No fevers or abdominal pain or vaginal symptoms.  Pt notes having an eye exam last July.  She hasn't checked her blood sugars recently but she hasn't had any problems or felt like she was having lows.       Prior Medications Reviewed Using: Patient Recall  Prior Medication List:  METFORMIN HCL 1000 MG TABS (METFORMIN HCL) Take 1 tablet by mouth two times a day GLIPIZIDE 10 MG  TABS (GLIPIZIDE) one  tab two times a day DILTIAZEM HCL CR 180 MG CP24 (DILTIAZEM HCL) Take 1 tablet by mouth once a day DARVOCET-N 100 100-650 MG TABS (PROPOXYPHENE N-APAP) Take 1 tablet by mouth  every 4 hours as needed for pain WARFARIN SODIUM 5 MG TABS (WARFARIN SODIUM) Take as directed. LEXAPRO 10 MG TABS (ESCITALOPRAM OXALATE) Take 1 tablet by mouth once a day ATENOLOL 25 MG TABS (ATENOLOL) Take 1 tablet by mouth two times a day OMEPRAZOLE 20 MG CPDR (OMEPRAZOLE) Take 1 tablet by mouth once a day CALCARB 600/D 600-125 MG-UNIT TABS (CALCIUM-VITAMIN D) Take 1 tablet by mouth two times a day MULTIVITAMINS TABS (MULTIPLE VITAMIN) Take 1 tablet by mouth once a day LEVOTHYROXINE SODIUM 75 MCG TABS (LEVOTHYROXINE SODIUM) Take 1 tablet by mouth once a day LASIX 40 MG TABS (FUROSEMIDE) Take 1 tablet by mouth once a day KLOR-CON 10 10 MEQ TBCR (POTASSIUM CHLORIDE) Take 1 tablet by mouth two times a day DIGITEK 0.25 MG TABS (DIGOXIN) Take 1 tablet by mouth once a day AMITRIPTYLINE HCL 50 MG  TABS (AMITRIPTYLINE HCL) Take 1 tablet by mouth at bedtime FISH OIL 1000 MG CAPS (OMEGA-3 FATTY ACIDS) Take 1 tablet by mouth once a day RELION BLOOD GLUCOSE TEST   STRP (GLUCOSE BLOOD) to test blood sugar twice daily LANCETS   MISC (LANCETS) to test blood sugar twice daily LISINOPRIL 10  MG  TABS (LISINOPRIL) Take 1 tablet by mouth once a day   Current Allergies (reviewed today): ! GEMFIBROZIL (GEMFIBROZIL) ! PENICILLIN ! CODEINE ! * LATEX  Past Medical History:    Reviewed history from 05/07/2006 and no changes required:       Atrial fibrillation       Diabetes mellitus, type II       Diverticulosis, colon       Hypertension       Hypothyroidism       Osteoarthritis       Esophageal stricture, hx of       Hiatal hernia, hx of, s/p       Cholecystitis, hx of, s/p cholecystectomy       Obstructive sleep apnea       Sarcoidosis       Diabetic neuropathy       Atypical chest pain, hx of       Benign right breast mass, hx of (papilloma)       Diabetic Neuropathy  Past Surgical History:    Reviewed history from 04/05/2006 and no changes required:       Hiatal hernia repair        Cholecystectomy       Esophageal dilation x 2       Hysterectomy       Excision of milk duct of right breast -> benign (1/08)   Social History:    Reviewed history from 04/05/2006 and no changes required:       Never Smoked       Alcohol use-no       Drug use-no       Divorced       On disability   Risk Factors: Tobacco use:  never Drug use:  no Alcohol use:  no Exercise:  yes    Type:  WALKING Seatbelt use:  100 %  Colonoscopy History:    Date of Last Colonoscopy:  08/22/2003   Review of Systems       As per HPI.   Physical Exam  General:     Alert and oriented x 3, no distress.  Eyes:     Anicteric. Lungs:     CTA bilaterally, normal respiratory effort.  Heart:     Irregularly irregular rhythm, no m/r/g.  Abdomen:     Obese, +BS's, soft, NT and ND.  Msk:     No CVA tenderness. Pulses:     DP pulses and PT pulses decreased.  Extremities:     No peripheral edema.  Feet appropriately groomed. Neurologic:     Gait normal, oriented.  Psych:     Mood euthymic.    Diabetes Management Exam:    Foot Exam (with socks and/or shoes not present):       Sensory-Monofilament:          Left foot: normal          Right foot: normal    Impression & Recommendations:  Problem # 1:  DYSURIA (ICD-788.1) Likely UTI.  Will check cx and tx empirically w/ 3 day course of cipro.  She may need 7 day course given she is a diabetic but I am hesitant b/c of her coumadin therapy.  No evidence for pyelo.  Will call if symptoms do not improve.  F/u INR after 48 hours of tx.  Her updated medication list for this problem includes:    Cipro 250 Mg Tabs (Ciprofloxacin hcl) .Marland Kitchen... Take 1 tablet by  mouth two times a day  Orders: T-Culture, Urine ([id number]) T-Urinalysis ([id number])   Problem # 2:  ATRIAL FIBRILLATION (ICD-427.31) Coumadin normally managed at her cardiologists office, however, given that we are starting an antibiotic, advised her to come in in two days for an  INR check.  Rate controlled presently.  Her updated medication list for this problem includes:    Warfarin Sodium 5 Mg Tabs (Warfarin sodium) .Marland Kitchen... Take as directed.    Atenolol 25 Mg Tabs (Atenolol) .Marland Kitchen... Take 1 tablet by mouth two times a day    Digitek 0.25 Mg Tabs (Digoxin) .Marland Kitchen... Take 1 tablet by mouth once a day  Future Orders: T-Protime, Auto ([id number]) ... 03/16/2008   Problem # 3:  DIABETES MELLITUS, TYPE II (ICD-250.00) BP and a1c at goal.  Up to date on urine microalb/cr ratio.  Checking lipids later this week when fasting.  Goal LDL<70.  She reports eye exam within the last year and will call and have those records faxed to [place].  Foot exam today fine.  Labs Reviewed: HgBA1c: 6.2 (03/13/2008)   Creat: 0.61 (12/15/2007)   Microalbumin: 0.20 (12/15/2007)  Her updated medication list for this problem includes:    Metformin Hcl 1000 Mg Tabs (Metformin hcl) .Marland Kitchen... Take 1 tablet by mouth two times a day    Glipizide 10 Mg Tabs (Glipizide) ..... One  tab two times a day    Lisinopril 10 Mg Tabs (Lisinopril) .Marland Kitchen... Take 1 tablet by mouth once a day  Orders: T- Capillary Blood Glucose ([id number]) T-Hgb A1C (in-house) ([id number])   Problem # 4:  HYPERTENSION (ICD-401.9) At goal, no changes.  Lytes/cr this week. Her updated medication list for this problem includes:    Diltiazem Hcl Cr 180 Mg Cp24 (Diltiazem hcl) .Marland Kitchen... Take 1 tablet by mouth once a day    Atenolol 25 Mg Tabs (Atenolol) .Marland Kitchen... Take 1 tablet by mouth two times a day    Lasix 40 Mg Tabs (Furosemide) .Marland Kitchen... Take 1 tablet by mouth once a day    Lisinopril 10 Mg Tabs (Lisinopril) .Marland Kitchen... Take 1 tablet by mouth once a day   Problem # 5:  PREVENTIVE HEALTH CARE (ICD-V70.0) Has had complete hysterectomy. (no more PAP smears). Will get DEXA scan  as she is 25 years post-menopausal w/ TAH/BSO. Mammogram up to date.  Per patient had colonoscopy 3 years ago....will obtain result with the [facility] office Given flu  vax.  Orders: Dexa scan (Dexa scan)   Complete Medication List: 1)  Metformin Hcl 1000 Mg Tabs (Metformin hcl) .... Take 1 tablet by mouth two times a day 2)  Glipizide 10 Mg Tabs (Glipizide) .... One  tab two times a day 3)  Diltiazem Hcl Cr 180 Mg Cp24 (Diltiazem hcl) .... Take 1 tablet by mouth once a day 4)  Darvocet-n 100 100-650 Mg Tabs (Propoxyphene n-apap) .... Take 1 tablet by mouth every 4 hours as needed for pain 5)  Warfarin Sodium 5 Mg Tabs (Warfarin sodium) .... Take as directed. 6)  Lexapro 10 Mg Tabs (Escitalopram oxalate) .... Take 1 tablet by mouth once a day 7)  Atenolol 25 Mg Tabs (Atenolol) .... Take 1 tablet by mouth two times a day 8)  Omeprazole 20 Mg Cpdr (Omeprazole) .... Take 1 tablet by mouth once a day 9)  Calcarb 600/d 600-125 Mg-unit Tabs (Calcium-vitamin d) .... Take 1 tablet by mouth two times a day 10)  Multivitamins Tabs (Multiple vitamin) .... Take 1 tablet by mouth once a day  11)  Levothyroxine Sodium 75 Mcg Tabs (Levothyroxine sodium) .... Take 1 tablet by mouth once a day 12)  Lasix 40 Mg Tabs (Furosemide) .... Take 1 tablet by mouth once a day 13)  Klor-con 10 10 Meq Tbcr (Potassium chloride) .... Take 1 tablet by mouth two times a day 14)  Digitek 0.25 Mg Tabs (Digoxin) .... Take 1 tablet by mouth once a day 15)  Amitriptyline Hcl 50 Mg Tabs (Amitriptyline hcl) .... Take 1 tablet by mouth at bedtime 16)  Fish Oil 1000 Mg Caps (Omega-3 fatty acids) .... Take 1 tablet by mouth once a day 17)  Relion Blood Glucose Test Strp (Glucose blood) .... To test blood sugar twice daily 18)  Lancets Misc (Lancets) .... To test blood sugar twice daily 19)  Lisinopril 10 Mg Tabs (Lisinopril) .... Take 1 tablet by mouth once a day 20)  Cipro 250 Mg Tabs (Ciprofloxacin hcl) .... Take 1 tablet by mouth two times a day  Other Orders: Future Orders: T-Comprehensive Metabolic Panel ([id number]) ... 03/14/2008 T-Lipid Profile [id number]) ... 03/14/2008   Patient  Instructions: 1)  Please make an appointment in three months for routien care. 2)  Please come to clinic this Friday to have your INR tested because we are starting you on an antibiotic and this can cause your INR to increase. 3)  Please call sooner if your symptoms to do not get better. 4)  Congratulations on your new grandson! 5)  Great job with your diabetes. 6)  Please get your dexa scan done.  This is a test to look for osteoporosis.   Prescriptions: CIPRO 250 MG TABS (CIPROFLOXACIN HCL) Take 1 tablet by mouth two times a day  #6 x 0   Entered and Authorized by:   Joaquin Courts  MD   Signed by:   Joaquin Courts  MD on 03/14/2008   Method used:   Electronically to        [facility]* (retail)       [address]       [place], [place]  [address]       Ph: [phone]       Fax: [phone]   RxID:   [id number]  ]  Vital Signs:  Patient Profile:   60 Years Old Female Height:     63 inches (160.02 cm) Weight:      209.3 pounds (95.14 kg) BMI:     37.21 Temp:     97.4 degrees F (36.33 degrees C) oral Pulse rate:   88 / minute BP sitting:   116 / 65             CBG Result 136      Laboratory Results   Urine Tests  Date/Time Received: 03/13/08 3:45PM Date/Time Reported: same  Routine Urinalysis   Color: yellow Appearance: Clear Glucose: negative   (Normal Range: Negative) Bilirubin: negative   (Normal Range: Negative) Ketone: negative   (Normal Range: Negative) Spec. Gravity: 1.010   (Normal Range: 1.003-1.035) Blood: negative   (Normal Range: Negative) pH: 5.5   (Normal Range: 5.0-8.0) Protein: negative   (Normal Range: Negative) Urobilinogen: 0.2   (Normal Range: 0-1) Nitrite: negative   (Normal Range: Negative) Leukocyte Esterace: small   (Normal Range: Negative)     Blood Tests   Date/Time Received: March 13, 2008 4:04 PM. Date/Time Reported: Alric Quan  March 13, 2008 4:04 PM   HGBA1C:  6.2%   (Normal Range: Non-Diabetic - 3-6%   Control Diabetic - 6-8%) CBG Random:: 136mg /dL       Last LDL:                                                 See Comment mg/dL (16/12/9602 5:40:98 PM)        Diabetic Foot Exam Foot Inspection Is there a history of a foot ulcer?              No Is there a foot ulcer now?              No Can the patient see the bottom of their feet?          Yes Are the shoes appropriate in style and fit?          Yes Is there swelling or an abnormal foot shape?          No Are the toenails long?                No Are the toenails thick?                No Are the toenails ingrown?              No Is there heavy callous build-up?              No Is there a claw toe deformity?                          No Is there elevated skin temperature?            No Is there limited ankle dorsiflexion?            No Is there foot or ankle muscle weakness?            No Do you have pain in calf while walking?           No      Comments: Pt states occ left leg gives out going down stairs.   10-g (5.07) Semmes-Weinstein Monofilament Test Performed by: Stanton Kidney Ditzler RN          Right Foot          Left Foot Visual Inspection               Test Control      normal         normal Site 1         normal         normal Site 2         normal         normal Site 3         normal         normal Site 4         normal         normal Site 5         normal         normal Site 6         normal         normal Site 7         normal         normal Site 8  normal         normal Site 9         normal         normal Site 10         normal         normal  Impression      normal         normal

## 2010-04-08 NOTE — Progress Notes (Signed)
Summary: Refill/gh  Phone Note Refill Request Message from:  Fax from Pharmacy on June 13, 2008 10:08 AM  Refills Requested: Medication #1:  KLOR-CON 10 10 MEQ TBCR Take 1 tablet by mouth two times a day   Last Refilled: 05/14/2008  Method Requested: Electronic Initial call taken by: Angelina Ok RN,  June 13, 2008 10:09 AM      Prescriptions: KLOR-CON 10 10 MEQ TBCR (POTASSIUM CHLORIDE) Take 1 tablet by mouth two times a day  #62 x 5   Entered and Authorized by:   Joaquin Courts  MD   Signed by:   Joaquin Courts  MD on 06/13/2008   Method used:   Electronically to        [facility]* (retail)       [address]       [place], [place]  [address]       Ph: [phone]       Fax: [phone]   RxID:   [id number]

## 2010-04-08 NOTE — Assessment & Plan Note (Signed)
Summary: FU VISIT/VS   Vital Signs:  Patient Profile:   60 Years Old Female  Pt. in pain?   yes    Location:   back    Intensity:   5    Type:       sharp  Vitals Entered By: Filomena Jungling (May 07, 2006 3:38 PM)              Is Patient Diabetic? No Nutritional Status Normal  Does patient need assistance? Functional Status Self care Ambulation Normal   PCP:  Peggye Pitt, MD  Chief Complaint:  CHECK_UP_BACK PAIN.  History of Present Illness:       This is a 60 years old female who presents with Back pain.  The symptoms began duration > 3 days ago.  The intensity is described as moderate.  No precipitating trauma.  The pain is low back pain.  The pain began gradually.  The pain radiates to left leg/foot.  Symptoms are made worse with flexion and extension.  The pain is improved with narcotics.    Prior Medications: METFORMIN HCL 1000 MG TABS (METFORMIN HCL) Take 1 tablet by mouth two times a day GLIPIZIDE 5 MG TABS (GLIPIZIDE) Take 1 tablet by mouth two times a day DILTIAZEM HCL CR 180 MG CP24 (DILTIAZEM HCL) Take 1 tablet by mouth once a day DARVOCET-N 100 100-650 MG TABS (PROPOXYPHENE N-APAP) Take 1 tablet by mouth every 4 hours as needed for pain WARFARIN SODIUM 5 MG TABS (WARFARIN SODIUM) Take 1 tablet by mouth once a day LEXAPRO 10 MG TABS (ESCITALOPRAM OXALATE) Take 1 tablet by mouth once a day ATENOLOL 25 MG TABS (ATENOLOL) Take 1 tablet by mouth two times a day OMEPRAZOLE 20 MG CPDR (OMEPRAZOLE) Take 1 tablet by mouth once a day CALCARB 600/D 600-125 MG-UNIT TABS (CALCIUM-VITAMIN D) Take 1 tablet by mouth two times a day MULTIVITAMINS TABS (MULTIPLE VITAMIN) Take 1 tablet by mouth once a day LEVOTHYROXINE SODIUM 75 MCG TABS (LEVOTHYROXINE SODIUM) Take 1 tablet by mouth once a day LASIX 40 MG TABS (FUROSEMIDE) Take 1 tablet by mouth once a day KLOR-CON 10 10 MEQ TBCR (POTASSIUM CHLORIDE) Take 1 tablet by mouth two times a day DIGITEK 0.25 MG TABS (DIGOXIN)  Take 1 tablet by mouth once a day HYDROXYZINE HCL 50 MG TABS (HYDROXYZINE HCL) Take 1 tablet by mouth every 6 hours as needed for severe itching. Current Allergies: ! GEMFIBROZIL (GEMFIBROZIL) ! PENICILLIN ! CODEINE ! * LATEX  Past Medical History:    Atrial fibrillation    Diabetes mellitus, type II    Diverticulosis, colon    Hypertension    Hypothyroidism    Osteoarthritis    Esophageal stricture, hx of    Hiatal hernia, hx of, s/p    Cholecystitis, hx of, s/p cholecystectomy    Obstructive sleep apnea    Sarcoidosis    Diabetic neuropathy    Atypical chest pain, hx of    Benign right breast mass, hx of (papilloma)    Diabetic Neuropathy     Review of Systems  The patient denies fever, chills, anorexia, fatigue, weakness, malaise, weight loss, chest pain, dyspnea on exertion, wheezing, abdominal pain, melena, hematochezia, and dysuria.     Physical Exam  General:     Well-developed,well-nourished,in no acute distress; alert,appropriate and cooperative throughout examination Head:     Normocephalic and atraumatic without obvious abnormalities. No apparent alopecia or balding. Eyes:     No corneal or conjunctival inflammation noted. EOMI. Perrla.  Vision grossly normal. Lungs:     Normal respiratory effort, chest expands symmetrically. Lungs are clear to auscultation, no crackles or wheezes. Heart:     normal rate, no murmur, no gallop, no rub, no JVD, and irregular rhythm.   Abdomen:     Bowel sounds positive,abdomen soft and non-tender without masses, organomegaly or hernias noted. Msk:     No pain to palpation of spinal or paraspinal muscles. Extremities:     No clubbing, cyanosis, edema, or deformity noted with normal full range of motion of all joints.   Neurologic:     alert & oriented X3, cranial nerves II-XII intact, strength normal in all extremities, sensation intact to light touch, gait normal, finger-to-nose normal, heel-to-shin normal, toes down  bilaterally on Babinski, Romberg negative, and LLE hyperreflexia.      Impression & Recommendations:  Problem # 1:  BACK PAIN, LUMBAR (ICD-724.2) This pain has been present for the past 4-5 months and is progressively worsening. Given osteopenia, concern is for a vertebral compression fracture, or disc pathology given radiation of "shooting pain" down left leg. Will get 3 view xrays of the lumbar spine. May need MRI if pain continues. She will continue to use the darvocet for pain. No refills given today. Her updated medication list for this problem includes:    Darvocet-n 100 100-650 Mg Tabs (Propoxyphene n-apap) .Marland Kitchen... Take 1 tablet by mouth every 4 hours as needed for pain  Orders: Diagnostic X-Ray/Fluoroscopy (Diagnostic X-Ray/Flu) Discussed use of moist heat or ice, modified activities, medications, and stretching/strengthening exercises. Back care instructions given. To be seen in 2 weeks if no improvement; sooner if worsening of symptoms.   Problem # 2:  BREAST MASS, BENIGN (ICD-611.72) Papilloma of the right breast. Was removed in 01/08 by Dr. Maple Hudson.  Problem # 3:  SARCOIDOSIS (ICD-135) Stable. No SOB. Off steroids. Dr. Shan Levans (pulmonary) following. PFTs essentially normal with only some  mild obstruction.  Problem # 4:  DIABETES MELLITUS, TYPE II (ICD-250.00) On next visit will need microalbumin. ?ophtalmologist visit this year? Well controlled with A1C of 5.6 01/08. On elavil for neuropathy. Her updated medication list for this problem includes:    Metformin Hcl 1000 Mg Tabs (Metformin hcl) .Marland Kitchen... Take 1 tablet by mouth two times a day    Glipizide 5 Mg Tabs (Glipizide) .Marland Kitchen... Take 1 tablet by mouth two times a day  Labs Reviewed: HgBA1c: 5.6 (04/05/2006)   Creat: 0.69 (03/17/2006)      Problem # 5:  HYPOTHYROIDISM (ICD-244.9) Check TSH today. Her updated medication list for this problem includes:    Levothyroxine Sodium 75 Mcg Tabs (Levothyroxine sodium) .Marland Kitchen... Take 1  tablet by mouth once a day  Orders: T-TSH ([id number])   Problem # 6:  ATRIAL FIBRILLATION (ICD-427.31) On Coumadin and digoxin. Followed by Dr. Antoine Poche with [facility]. Her updated medication list for this problem includes:    Warfarin Sodium 5 Mg Tabs (Warfarin sodium) .Marland Kitchen... Take 1 tablet by mouth once a day    Atenolol 25 Mg Tabs (Atenolol) .Marland Kitchen... Take 1 tablet by mouth two times a day    Digitek 0.25 Mg Tabs (Digoxin) .Marland Kitchen... Take 1 tablet by mouth once a day   Problem # 7:  Preventive Health Care (ICD-V70.0) No further pap smears as complete hysterectomy in 1983 2/2 endometriosis Mammogram due 12/08 Per pt colonoscopy done 3-4 yrs ago by Dr. Terrial Rhodes. Will acheive records, apparently some polyps were found. Might need repeat soon. FLP due 11/08  Medications  Added to Medication List This Visit: 1)  Amitriptyline Hcl 25 Mg Tabs (amitriptyline Hcl)  .... Take 1 tablet by mouth once a day at bedtime   Patient Instructions: 1)  Please schedule a follow-up appointment in 3 months. 2)  Stop taking simvastatin (cholesterol medication) 3)  Start taking Tricor 145 mg 1 tablet daily.     Vital Signs:  Patient Profile:   60 Years Old Female    Location:   back    Intensity:   5    Type:       sharp

## 2010-04-08 NOTE — Assessment & Plan Note (Signed)
Summary: ROUTINE CK/EST/VS   Vital Signs:  Patient profile:   60 year old female Menstrual status:  postmenopausal Height:      63 inches (160.02 cm) Weight:      215.7 pounds (98.05 kg) BMI:     38.35 Temp:     97.0 degrees F (36.11 degrees C) oral Pulse rate:   102 / minute BP sitting:   136 / 75  (left arm) Cuff size:   regular  Vitals Entered By: Theotis Barrio NT II (May 09, 2009 1:55 PM) CC: RIGHT FOOT PAIN   /  CHRONIC BACK PAIN  /  MEDICATION REFILL, Depression Is Patient Diabetic? Yes Did you bring your meter with you today? No Pain Assessment Patient in pain? yes     Location: BACK/ R-FOOT Intensity: 10 Type: sharp Onset of pain  Chronic Nutritional Status BMI of > 30 = obese CBG Result 217  Have you ever been in a relationship where you felt threatened, hurt or afraid?No   Does patient need assistance? Functional Status Self care Ambulation Normal   Primary Care Provider:  Joaquin Courts  MD  CC:  RIGHT FOOT PAIN   /  CHRONIC BACK PAIN  /  MEDICATION REFILL and Depression.  History of Present Illness: Pt is a 60 yo female w/ past med hx below here for routine f/u of her chronic conditions.  She is upset that she has gained 5 lbs and is upset about it.  However, her weight has been stable over the last two years.  She is planning on getting back with [employer].  She has been feeling more depressed recently over the winter.  She is struggling w/ her chronic back pain related to fibromyalgia since darvocet was taken off the market.   She has an appt for a cardiac stress test this month.  Depression History:      The patient denies a depressed mood most of the day and a diminished interest in her usual daily activities.         Current Medications (verified): 1)  Metformin Hcl 1000 Mg Tabs (Metformin Hcl) .... Take 1 Tablet By Mouth Two Times A Day 2)  Glipizide 10 Mg  Tabs (Glipizide) .... One  Tab Two Times A Day 3)  Diltiazem Hcl Cr 180 Mg Cp24  (Diltiazem Hcl) .... Take 1 Tablet By Mouth Once A Day 4)  Warfarin Sodium 5 Mg Tabs (Warfarin Sodium) .... Take As Directed. 5)  Lexapro 10 Mg Tabs (Escitalopram Oxalate) .... Take 1 Tablet By Mouth Once A Day 6)  Atenolol 25 Mg Tabs (Atenolol) .... Take 1 Tablet By Mouth Two Times A Day 7)  Omeprazole 20 Mg Cpdr (Omeprazole) .... Take 1 Tablet By Mouth Once A Day Prn 8)  Calcium 600/vitamin D 600-400 Mg-Unit Tabs (Calcium Carbonate-Vitamin D) .... Take 1 Tablet By Mouth Two Times A Day 9)  Multivitamins Tabs (Multiple Vitamin) .... Take 1 Tablet By Mouth Once A Day 10)  Levothyroxine Sodium 75 Mcg Tabs (Levothyroxine Sodium) .... Take 1 Tablet By Mouth Once A Day 11)  Lasix 40 Mg Tabs (Furosemide) .... Take 1 Tablet By Mouth Once A Day 12)  Klor-Con 10 10 Meq Tbcr (Potassium Chloride) .... Take 1 Tablet By Mouth Two Times A Day 13)  Digitek 0.25 Mg Tabs (Digoxin) .... Take 1 Tablet By Mouth Once A Day 14)  Amitriptyline Hcl 50 Mg  Tabs (Amitriptyline Hcl) .... Take 1 Tablet By Mouth At Bedtime 15)  Fish Oil 1000 Mg Caps (Omega-3 Fatty Acids) .... Take 1 Tablet By Mouth Twice A Day 16)  Relion Blood Glucose Test   Strp (Glucose Blood) .... To Test Blood Sugar Twice Daily 17)  Lancets   Misc (Lancets) .... To Test Blood Sugar Twice Daily 18)  Lisinopril 10 Mg  Tabs (Lisinopril) .... Take 1 Tablet By Mouth Once A Day 19)  Pravachol 20 Mg Tabs (Pravastatin Sodium) .... Take 1/2 Tab By Mouth Before Bedtime and Increase To A Full Tab At Bedtime After One Week.  Allergies (verified): 1)  ! Gemfibrozil (Gemfibrozil) 2)  ! Penicillin 3)  ! Codeine 4)  ! * Latex  Past History:  Past Medical History: Last updated: 11/02/2008 Atrial fibrillation Diabetes mellitus, type II Diverticulosis, colon Hypertension Hypothyroidism Osteoarthritis Esophageal stricture, hx of Hiatal hernia, hx of, s/p Cholecystitis, hx of, s/p cholecystectomy Obstructive sleep apnea Sarcoidosis Diabetic  neuropathy Atypical chest pain, hx of Benign right breast mass, hx of (papilloma) Diabetic Neuropathy Fibromyalgia  Past Surgical History: Last updated: 11/02/2008 Hiatal hernia repair Cholecystectomy Esophageal dilation x 2 Hysterectomy Excision of milk duct of right breast -> benign (1/08) Hernia Surgery(femoral)  Social History: Last updated: 11/02/2008 Never Smoked Alcohol use-no Drug use-no Divorced On disability  Family History: Reviewed history from 11/02/2008 and no changes required. Maternal uncle w/ dementia.   Melenoma: Father Family History of Colitis/Crohn's:Mother Family History of Diabetes: Father & Sister Family History of Heart Disease: Father  Social History: Reviewed history from 11/02/2008 and no changes required. Never Smoked Alcohol use-no Drug use-no Divorced On disability  Review of Systems       As per HPI.  Physical Exam  General:  alert, well-developed, well-nourished, and healthy-appearing.   Eyes:  anicteric. Lungs:  Normal respiratory effort, chest expands symmetrically. Lungs are clear to auscultation, no crackles or wheezes. Heart:  Irreg irreg, reg rate, SEM II/VI at RSB.  Abdomen:  +BS's, soft, NT and ND. Msk:  Paraspinal muscles in back difusely TTP. Extremities:  no peripheral edema, 1st toenail on R foot dystrophic. Psych:  slightly anxious appearing, good eye contact.   Impression & Recommendations:  Problem # 1:  CHEST PAIN (ICD-786.50) Not having any symptoms presently.  F/u OP myoview per her cardiologist.  Problem # 2:  DIABETES MELLITUS, TYPE II (ICD-250.00) A1c at goal.  If she can work on weight loss, she may be able to be off the glipizide at some point.  Her updated medication list for this problem includes:    Metformin Hcl 1000 Mg Tabs (Metformin hcl) .Marland Kitchen... Take 1 tablet by mouth two times a day    Glipizide 10 Mg Tabs (Glipizide) ..... One  tab two times a day    Lisinopril 10 Mg Tabs (Lisinopril)  .Marland Kitchen... Take 1 tablet by mouth once a day  Orders: T- Capillary Blood Glucose ([address]) T-Hgb A1C (in-house) ([id number])  Labs Reviewed: Creat: 0.74 (08/24/2008)    Reviewed HgBA1c results: 6.4 (05/09/2009)  6.1 (12/04/2008)  Problem # 3:  ATRIAL FIBRILLATION (ICD-427.31) Rate controlled presently on exam.  Anticoagulation per her cardiologist.  F/u digoxin level at next visit.  Her updated medication list for this problem includes:    Diltiazem Hcl Cr 180 Mg Cp24 (Diltiazem hcl) .Marland Kitchen... Take 1 tablet by mouth once a day    Warfarin Sodium 5 Mg Tabs (Warfarin sodium) .Marland Kitchen... Take as directed.    Atenolol 25 Mg Tabs (Atenolol) .Marland Kitchen... Take 1 tablet by mouth two times a day    Digitek  0.25 Mg Tabs (Digoxin) .Marland Kitchen... Take 1 tablet by mouth once a day  Problem # 4:  HYPERTRIGLYCERIDEMIA (ICD-272.1) Lipids/LFT's today.  Her updated medication list for this problem includes:    Pravachol 20 Mg Tabs (Pravastatin sodium) .Marland Kitchen... Take 1/2 tab by mouth before bedtime and increase to a full tab at bedtime after one week.  Problem # 5:  DEPRESSION (ICD-311) Had worsening mood symptoms.  Notes she can't afford lexapro anymore.  Amitriptyline hasn't helped.  No SI, will give trial of prozac as this may help her depressive symptoms and her fibromyalgia symptoms.  Will f/u in 1 month.  The following medications were removed from the medication list:    Lexapro 10 Mg Tabs (Escitalopram oxalate) .Marland Kitchen... Take 1 tablet by mouth once a day    Amitriptyline Hcl 50 Mg Tabs (Amitriptyline hcl) .Marland Kitchen... Take 1 tablet by mouth at bedtime Her updated medication list for this problem includes:    Prozac 20 Mg Caps (Fluoxetine hcl) .Marland Kitchen... Take 1 tablet by mouth every morning  Problem # 6:  FIBROMYALGIA, SEVERE (ICD-729.1) Decompensated once darvocet taken off the market.  Avoiding narcotics as there is no beneift in fibromyalgia.  WIl d/c amitriptyline b/c she doesn't think this is helping and w/ her hx of cardiac probs, would  prefer to try other meds.  Will try low dose flexeril, switch SSRI, and advised stretching exercises.  F/u in 1 month.  The following medications were removed from the medication list:    Darvocet-n 100 100-650 Mg Tabs (Propoxyphene n-apap) .Marland Kitchen... Take 1 tablet by mouth every 4 hours as needed for pain    Vicodin Es 7.5-750 Mg Tabs (Hydrocodone-acetaminophen) .Marland Kitchen... Take 1 tab q4-6h as needed pain Her updated medication list for this problem includes:    Flexeril 5 Mg Tabs (Cyclobenzaprine hcl) .Marland Kitchen... Take 1 tab by mouth every 12 hours as needed for pain.  Complete Medication List: 1)  Metformin Hcl 1000 Mg Tabs (Metformin hcl) .... Take 1 tablet by mouth two times a day 2)  Glipizide 10 Mg Tabs (Glipizide) .... One  tab two times a day 3)  Diltiazem Hcl Cr 180 Mg Cp24 (Diltiazem hcl) .... Take 1 tablet by mouth once a day 4)  Warfarin Sodium 5 Mg Tabs (Warfarin sodium) .... Take as directed. 5)  Atenolol 25 Mg Tabs (Atenolol) .... Take 1 tablet by mouth two times a day 6)  Omeprazole 20 Mg Cpdr (Omeprazole) .... Take 1 tablet by mouth once a day prn 7)  Calcium 600/vitamin D 600-400 Mg-unit Tabs (Calcium carbonate-vitamin d) .... Take 1 tablet by mouth two times a day 8)  Multivitamins Tabs (Multiple vitamin) .... Take 1 tablet by mouth once a day 9)  Levothyroxine Sodium 75 Mcg Tabs (Levothyroxine sodium) .... Take 1 tablet by mouth once a day 10)  Lasix 40 Mg Tabs (Furosemide) .... Take 1 tablet by mouth once a day 11)  Klor-con 10 10 Meq Tbcr (Potassium chloride) .... Take 1 tablet by mouth two times a day 12)  Digitek 0.25 Mg Tabs (Digoxin) .... Take 1 tablet by mouth once a day 13)  Fish Oil 1000 Mg Caps (Omega-3 fatty acids) .... Take 1 tablet by mouth twice a day 14)  Relion Blood Glucose Test Strp (Glucose blood) .... To test blood sugar twice daily 15)  Lancets Misc (Lancets) .... To test blood sugar twice daily 16)  Lisinopril 10 Mg Tabs (Lisinopril) .... Take 1 tablet by mouth once a  day 17)  Pravachol  20 Mg Tabs (Pravastatin sodium) .... Take 1/2 tab by mouth before bedtime and increase to a full tab at bedtime after one week. 18)  Prozac 20 Mg Caps (Fluoxetine hcl) .... Take 1 tablet by mouth every morning 19)  Flexeril 5 Mg Tabs (Cyclobenzaprine hcl) .... Take 1 tab by mouth every 12 hours as needed for pain.  Other Orders: T-Lipid Profile ([id number]) T-Comprehensive Metabolic Panel [phone])  Patient Instructions: 1)  Please make a followup appointment in 1-2 months for a checkup. 2)  Please try to go to [facility]. 3)  Please try yoga to help with your pain symptoms. 4)  You can stop amitriptyline and lexapro.  5)  Start prozac (fluoxetine) to help with your symptoms. 6)  Flexeril may help your symptoms.  It may make you sleepy. Prescriptions: FLEXERIL 5 MG TABS (CYCLOBENZAPRINE HCL) Take 1 tab by mouth every 12 hours as needed for pain.  #60 x 1   Entered and Authorized by:   Joaquin Courts  MD   Signed by:   Joaquin Courts  MD on 05/09/2009   Method used:   Print then Give to Patient   RxID:   [id number]  Process Orders Check Orders Results:     [facility]: Check successful Tests Sent for requisitioning (May 10, 2009 10:02 AM):     05/09/2009: [facility] -- T-Lipid Profile [phone] (signed)     05/09/2009: [facility] -- T-Comprehensive Metabolic Panel [id number] (signed)    Prevention & Chronic Care Immunizations   Influenza vaccine: Fluvax MCR  (12/04/2008)   Influenza vaccine deferral: Not available  (08/24/2008)   Influenza vaccine due: 11/07/2008    Tetanus booster: Not documented   Td booster deferral: Deferred  (08/24/2008)    Pneumococcal vaccine: Not documented   Pneumococcal vaccine deferral: Deferred  (08/24/2008)    H. zoster vaccine: Not documented   H. zoster vaccine deferral: Deferred  (08/24/2008)  Colorectal Screening   Hemoccult: Not documented    Hemoccult action/deferral: Ordered  (08/24/2008)    Colonoscopy: Location:  [facility].    (10/22/2008)   Colonoscopy action/deferral: Repeat colonoscopy in 7 years. 2012  (08/22/2003)   Colonoscopy due: 05/2010  Other Screening   Pap smear: Not documented   Pap smear action/deferral: Not indicated S/P hysterectomy  (08/24/2008)    Mammogram: ASSESSMENT: Negative - BI-RADS 1^MM DIGITAL SCREENING  (11/19/2008)   Mammogram due: 10/2008    DXA bone density scan:  Lumbar Spine:  T Score > -1.0 Spine.  Hip Total: T Score -2.5 to -1.0 Hip.      (03/20/2008)   DXA scan due: 03/2010    Smoking status: never  (12/04/2008)  Diabetes Mellitus   HgbA1C: 6.4  (05/09/2009)   HgbA1C action/deferral: Ordered  (12/04/2008)    Eye exam: Not documented   Diabetic eye exam action/deferral: Deferred  (05/09/2009)    Foot exam: yes  (03/13/2008)   Foot exam action/deferral: Do today   High risk foot: Not documented   Foot care education: Not documented   Foot exam due: 03/13/2009    Urine microalbumin/creatinine ratio: 23.8  (12/15/2007)   Urine microalbumin action/deferral: Not indicated    Diabetes flowsheet reviewed?: Yes   Progress toward A1C goal: At goal  Lipids   Total Cholesterol: 163  (08/24/2008)   Lipid panel action/deferral: Lipid Panel ordered   LDL: 63  (08/24/2008)   LDL Direct: Not documented   HDL: 35  (08/24/2008)   Triglycerides: 327  (  08/24/2008)    SGOT (AST): 51  (08/30/2008)   SGPT (ALT): 60  (08/30/2008) CMP ordered    Alkaline phosphatase: 75  (08/30/2008)   Total bilirubin: 0.7  (08/30/2008)    Lipid flowsheet reviewed?: Yes   Progress toward LDL goal: At goal  Hypertension   Last Blood Pressure: 136 / 75  (05/09/2009)   Serum creatinine: 0.74  (08/24/2008)   Serum potassium 4.6  (08/24/2008) CMP ordered     Hypertension flowsheet reviewed?: Yes   Progress toward BP goal: At goal  Self-Management Support :   Personal Goals (by the  next clinic visit) :     Personal A1C goal: 7  (12/04/2008)     Personal blood pressure goal: 130/80  (12/04/2008)     Personal LDL goal: 100  (12/04/2008)    Patient will work on the following items until the next clinic visit to reach self-care goals:     Medications and monitoring: take my medicines every day, check my blood sugar, bring all of my medications to every visit, examine my feet every day  (05/09/2009)     Eating: drink diet soda or water instead of juice or soda, eat more vegetables, use fresh or frozen vegetables, eat foods that are low in salt, eat baked foods instead of fried foods, eat fruit for snacks and desserts, limit or avoid alcohol  (05/09/2009)     Activity: take a 30 minute walk every day  (05/09/2009)    Diabetes self-management support: Resources for patients handout  (05/09/2009)   Last diabetes self-management training by diabetes educator: 07/25/2007   Last medical nutrition therapy: 03/08/2007    Hypertension self-management support: Resources for patients handout  (05/09/2009)    Lipid self-management support: Resources for patients handout  (05/09/2009)        Resource handout printed.   Laboratory Results   Blood Tests   Date/Time Received: May 09, 2009 2:45 PM  Date/Time Reported: Sofhia Ulibarri  May 09, 2009 2:45 PM   HGBA1C: 6.4%   (Normal Range: Non-Diabetic - 3-6%   Control Diabetic - 6-8%) CBG Random:: 217mg /dL

## 2010-04-08 NOTE — Progress Notes (Signed)
Summary: Refill/gh  Phone Note Refill Request Message from:  Fax from Pharmacy on December 26, 2009 10:44 AM  Refills Requested: Medication #1:  KLOR-CON 10 10 MEQ TBCR Take 1 tablet by mouth two times a day   Last Refilled: 12/26/2009  Medication #2:  LISINOPRIL 10 MG  TABS Take 1 tablet by mouth once a day   Last Refilled: 12/26/2009  Method Requested: Electronic Initial call taken by: Angelina Ok RN,  December 26, 2009 10:45 AM  Follow-up for Phone Call        Refill approved-nurse to complete Follow-up by: Melida Quitter MD,  December 26, 2009 11:34 AM    Prescriptions: LISINOPRIL 10 MG  TABS (LISINOPRIL) Take 1 tablet by mouth once a day  #30 x 6   Entered and Authorized by:   Melida Quitter MD   Signed by:   Melida Quitter MD on 12/26/2009   Method used:   Electronically to        [facility]* (retail)       [address]       [place], [place]  [address]       Ph: [phone]       Fax: [phone]   RxID:   [id number] KLOR-CON 10 10 MEQ TBCR (POTASSIUM CHLORIDE) Take 1 tablet by mouth two times a day  #62 x 6   Entered and Authorized by:   Melida Quitter MD   Signed by:   Melida Quitter MD on 12/26/2009   Method used:   Electronically to        [facility]* (retail)       [address]       [place], [place]  [address]       Ph: [phone]       Fax: [phone]   RxID:   [id number]

## 2010-04-08 NOTE — Progress Notes (Signed)
Summary: monitor results     Phone Note Outgoing Call   Call placed by: Charolotte Capuchin, RN,  May 21, 2009 3:18 PM Call placed to: Patient Details for Reason: monitor results Summary of Call: called pt to give  her the results of her monitor.  Monitor showed, per Dr Antoine Poche, Atrial Fib, rate controlled, no pauses, no changes in treatment.  Left message for pt to call back. Initial call taken by: Charolotte Capuchin, RN,  May 21, 2009 3:20 PM  Follow-up for Phone Call        pt aware Meredith Staggers, RN  May 22, 2009 11:41 AM

## 2010-04-08 NOTE — Progress Notes (Signed)
Summary: refill/gh  Phone Note Refill Request Message from:  Fax from Pharmacy on Jul 19, 2006 9:35 AM  Refills Requested: Medication #1:  LEVOTHYROXINE SODIUM 75 MCG TABS Take 1 tablet by mouth once a day   Last Refilled: 06/11/2006  Follow-up for Phone Call        Refill approved-nurse to complete Follow-up by: Eliseo Gum MD,  Jul 19, 2006 12:39 PM  Additional Follow-up for Phone Call Additional follow up Details #1::        Rx faxed to pharmacy Additional Follow-up by: Angelina Ok RN,  Jul 19, 2006 2:10 PM    Prescriptions: LEVOTHYROXINE SODIUM 75 MCG TABS (LEVOTHYROXINE SODIUM) Take 1 tablet by mouth once a day  #31 x 3   Entered and Authorized by:   Eliseo Gum MD   Signed by:   Eliseo Gum MD on 07/19/2006   Method used:   Telephoned to ...       [facility]       [address]       [place], [place]  [address]       Ph: [phone]       Fax: [phone]   RxID:   [id number]

## 2010-04-08 NOTE — Assessment & Plan Note (Signed)
Summary: 6 month rov/sl      Allergies Added:   Visit Type:  Follow-up Primary Provider:  Joaquin Courts  MD  CC:  Atrial Fibrillation.  History of Present Illness: The she presents for followup. Since I last saw her she had a stress perfusion study which demonstrated no evidence of ischemia or infarct. This was in March and prior to getting both her feet operated on for some chronic orthopedic problems. She did well with those surgeries though she's not yet recovered enough to walk. She has had some chest discomfort over the past week. This is similar to pain she had at the time of previous stress testing. It is sporadic. It is not reproducible though she might notice it when walking up the stairs. It happens at rest. He is a moderate burning in her throat. There is no shoulder or arm discomfort. There are no palpitations, presyncope or syncope. She might get short of breath with this but denies any nausea or vomiting. It may last for up to an hour before going away spontaneously.  Current Medications (verified): 1)  Metformin Hcl 1000 Mg Tabs (Metformin Hcl) .... Take 1 Tablet By Mouth Two Times A Day 2)  Glipizide 10 Mg  Tabs (Glipizide) .... Take 1 Tablet By Mouth Two Times A Day 3)  Diltiazem Hcl Cr 180 Mg Cp24 (Diltiazem Hcl) .... Take 1 Tablet By Mouth Once A Day 4)  Warfarin Sodium 5 Mg Tabs (Warfarin Sodium) .... Take As Directed. 5)  Atenolol 25 Mg Tabs (Atenolol) .... Take 1 Tablet By Mouth Two Times A Day 6)  Omeprazole 20 Mg Cpdr (Omeprazole) .... Take 1 Tablet By Mouth Once A Day Prn 7)  Calcium 600/vitamin D 600-400 Mg-Unit Tabs (Calcium Carbonate-Vitamin D) .... Take 1 Tablet By Mouth Two Times A Day 8)  Multivitamins Tabs (Multiple Vitamin) .... Take 1 Tablet By Mouth Once A Day 9)  Levothyroxine Sodium 75 Mcg Tabs (Levothyroxine Sodium) .... Take 1 Tablet By Mouth Once A Day 10)  Lasix 40 Mg Tabs (Furosemide) .... Take 1 Tablet By Mouth Once A Day 11)  Klor-Con 10 10 Meq  Tbcr (Potassium Chloride) .... Take 1 Tablet By Mouth Two Times A Day 12)  Digitek 0.25 Mg Tabs (Digoxin) .... Take 1 Tablet By Mouth Once A Day 13)  Fish Oil 1000 Mg Caps (Omega-3 Fatty Acids) .... Take 1 Tablet By Mouth Twice A Day 14)  Relion Blood Glucose Test   Strp (Glucose Blood) .... To Test Blood Sugar Twice Daily 15)  Lancets   Misc (Lancets) .... To Test Blood Sugar Twice Daily 16)  Lisinopril 10 Mg  Tabs (Lisinopril) .... Take 1 Tablet By Mouth Once A Day 17)  Pravachol 40 Mg Tabs (Pravastatin Sodium) .... Two Tablets By Mouth Once Daily. 18)  Prozac 20 Mg Caps (Fluoxetine Hcl) .... Take 1 Tablet By Mouth Every Morning 19)  Flexeril 5 Mg Tabs (Cyclobenzaprine Hcl) .... Take 1 Tab By Mouth Every 12 Hours As Needed For Pain. 20)  Niacin 500 Mg Tabs (Niacin) .... Two Tabs By Mouth Twice A Day. 21)  Amitriptyline Hcl 50 Mg Tabs (Amitriptyline Hcl) .... Take 1 Tab By Mouth At Bedtime  Allergies (verified): 1)  ! Gemfibrozil (Gemfibrozil) 2)  ! Penicillin 3)  ! Codeine 4)  ! * Latex  Past History:  Past Medical History: Reviewed history from 11/02/2008 and no changes required. Atrial fibrillation Diabetes mellitus, type II Diverticulosis, colon Hypertension Hypothyroidism Osteoarthritis Esophageal stricture, hx of Hiatal  hernia, hx of, s/p Cholecystitis, hx of, s/p cholecystectomy Obstructive sleep apnea Sarcoidosis Diabetic neuropathy Atypical chest pain, hx of Benign right breast mass, hx of (papilloma) Diabetic Neuropathy Fibromyalgia  Past Surgical History: Reviewed history from 11/02/2008 and no changes required. Hiatal hernia repair Cholecystectomy Esophageal dilation x 2 Hysterectomy Excision of milk duct of right breast -> benign (1/08) Hernia Surgery(femoral)  Review of Systems       As stated in the HPI and negative for all other systems.   Vital Signs:  Patient profile:   60 year old female Menstrual status:  postmenopausal Height:      63  inches Weight:      204 pounds BMI:     36.27 Pulse rate:   76 / minute Resp:     16 per minute BP sitting:   98 / 62  (right arm)  Vitals Entered By: Marrion Coy, CNA (October 18, 2009 2:17 PM)  Physical Exam  General:  Well developed, well nourished, in no acute distress. Head:  normocephalic and atraumatic Eyes:  PERRLA/EOM intact; conjunctiva and lids normal. Mouth:  Teeth, gums and palate normal. Oral mucosa normal. Neck:  Neck supple, no JVD. No masses, thyromegaly or abnormal cervical nodes. Chest Wall:  no deformities or breast masses noted Lungs:  Clear bilaterally to auscultation and percussion. Heart:  Non-displaced PMI, chest non-tender; regular rate and rhythm, S1, S2 without murmurs, rubs or gallops. Carotid upstroke normal, no bruit. Normal abdominal aortic size, no bruits. Femorals normal pulses, no bruits. Pedals normal pulses. No edema, no varicosities. Abdomen:  Bowel sounds positive; abdomen soft and non-tender without masses, organomegaly, or hernias noted. No hepatosplenomegaly. Msk:  Back normal, normal gait. Muscle strength and tone normal. Pulses:  pulses normal in all 4 extremities Extremities:  No clubbing or cyanosis. Neurologic:  Alert and oriented x 3. Skin:  Intact without lesions or rashes. Cervical Nodes:  no significant adenopathy Psych:  Normal affect.   EKG  Procedure date:  10/18/2009  Findings:      atrial fibrillation, left axis deviation, no acute ST-T wave changes  Impression & Recommendations:  Problem # 1:  CHEST PAIN (ICD-786.50) She has no objective evidence of ischemia. She has stress perfusion study in March. She's had similar symptoms evaluated with stress perfusion imaging in the past and these have been negative. Therefore, given all of this, I think the pretest probability that this is obstructive coronary disease is very low. She does have a history of GI problems and this is the more likely etiology. Therefore, no further  cardiovascular testing will be planned if she should follow with her primary care physician if this persists. Orders: EKG w/ Interpretation (93000)  Problem # 2:  ATRIAL FIBRILLATION (ICD-427.31) She tolerates this rhythm. She needs anticoagulation with previous TIA and other significant comorbidities. She'll continue with the meds as listed.  Problem # 3:  CAROTID STENOSIS (ICD-433.10) She had carotid Dopplers this year and will have this followed routinely.  Patient Instructions: 1)  Your physician recommends that you schedule a follow-up appointment in: 1 yr with Dr Antoine Poche 2)  Your physician recommends that you continue on your current medications as directed. Please refer to the Current Medication list given to you today.

## 2010-04-08 NOTE — Progress Notes (Signed)
Summary: qquestion about her coumadia   Phone Note Call from Patient Call back at Home Phone [phone]   Caller: Patient Summary of Call: pt having surgery on monday and need to know if she needs to stop her coumadin Initial call taken by: Judie Grieve,  October 03, 2008 2:40 PM  Follow-up for Phone Call        Spoke to pt. Pt would like to know when and how long she can be off coumodin. Pt is having teeth extraction (2)  on monday 10/09/08.I  let pt know will send message to Dr. Jenene Slicker nurse desk top. Ollen Gross, RN, BSN  October 03, 2008 3:48 PM   Additional Follow-up for Phone Call Additional follow up Details #1::        pt aware ok to hold coumadin 5 days prior to dental work.  coumadin clinic to call pt to schedule her follow up.  pt states understanding Additional Follow-up by: Charolotte Capuchin, RN,  October 04, 2008 9:11 AM

## 2010-04-08 NOTE — Progress Notes (Signed)
Summary: refill/gh  Phone Note Refill Request Message from:  Pharmacy on October 26, 2006 9:44 AM  Refills Requested: Medication #1:  LEVOTHYROXINE SODIUM 75 MCG TABS Take 1 tablet by mouth once a day   Last Refilled: 10/09/2006 Pt wants for next month has already gotten her 3 refills.  Initial call taken by: Angelina Ok RN,  October 26, 2006 9:45 AM  Follow-up for Phone Call        Refill approved-nurse to complete.  Pt needs to come for TSH Follow-up by: Manning Charity MD,  October 26, 2006 11:00 AM  Additional Follow-up for Phone Call Additional follow up Details #1::        Rx faxed to pharmacy. Pt to be called and scheduled for TSH Additional Follow-up by: Angelina Ok RN,  October 26, 2006 11:54 AM      Prescriptions: LEVOTHYROXINE SODIUM 75 MCG TABS (LEVOTHYROXINE SODIUM) Take 1 tablet by mouth once a day  #31 x 0   Entered and Authorized by:   Manning Charity MD   Signed by:   Manning Charity MD on 10/26/2006   Method used:   Telephoned to ...       [facility]       [address]       [place], [place]  [address]       Ph: [phone]       Fax: [phone]   RxID:   [id number]

## 2010-04-08 NOTE — Progress Notes (Signed)
Summary: Refill/gh  Phone Note Refill Request Message from:  Fax from Pharmacy on July 03, 2009 9:03 AM  Refills Requested: Medication #1:  GLIPIZIDE 10 MG  TABS one  tab two times a day   Last Refilled: 06/01/2009  Method Requested: Electronic Initial call taken by: Angelina Ok RN,  July 03, 2009 9:03 AM    New/Updated Medications: GLIPIZIDE 10 MG  TABS (GLIPIZIDE) Take 1 tablet by mouth two times a day Prescriptions: GLIPIZIDE 10 MG  TABS (GLIPIZIDE) Take 1 tablet by mouth two times a day  #60 x 6   Entered and Authorized by:   Joaquin Courts  MD   Signed by:   Joaquin Courts  MD on 07/03/2009   Method used:   Electronically to        [facility] (retail)       [address]       [place], [place]  [address]       Ph: [phone]       Fax: [phone]   RxID:   [id number]

## 2010-04-08 NOTE — Procedures (Signed)
Summary: Colonoscopy  Colonoscopy   Imported By: Harlow Mares CMA 09/12/2008 15:36:45  _____________________________________________________________________  External Attachment:    Type:   Image     Comment:   External Document

## 2010-04-10 NOTE — Assessment & Plan Note (Signed)
Summary: ?bronchitis?, fever? x 3 days, very hoarse/pcp-sidhu/hla   Vital Signs:  Patient profile:   60 year old female Height:      63 inches (160.02 cm) Weight:      197.7 pounds (89.86 kg) BMI:     35.15 O2 Sat:      97 % on Room air Temp:     97.7 degrees F (36.50 degrees C) oral Pulse rate:   99 / minute BP sitting:   130 / 73  (right arm) Cuff size:   large  Vitals Entered By: Cynda Familia Duncan Dull) (March 11, 2010 11:11 AM)  O2 Flow:  Room air CC: throat and ears hurt, coughing, headaches, symptoms started 2-3 days ago, now worsening, Depression Is Patient Diabetic? Yes Nutritional Status BMI of > 30 = obese  Does patient need assistance? Functional Status Self care Ambulation Normal   Primary Care Provider:  Melida Quitter MD  CC:  throat and ears hurt, coughing, headaches, symptoms started 2-3 days ago, now worsening, and Depression.  History of Present Illness: Pt is a 60 y/o F with PMH outlined in the EMR who presents for a acute visit.  SHe reports a sore throat, bilateral ear pain for the past 2-3 days.  She reports exposure to her grandson who was sick with a URI with a fever of 102F.  She reports fevers at home with temp of 74F.  SHe states that swallowing increased her throat pain.    She reports increased cough that is different from her baseline.  Her cough is productive of yellow sputum that is associated with pleuritic chest pain in her upper chest near her throat, although she states her chest discomfort may be from her fibromyalgia.   She denies nausea, vomiting, and diarrhea,  SHe reports a mild frontal headache.  Denies SOB, DOE, syncope, and other complaint.  She reports an allergy to PCN ("breaks out", hives/rash) but states she always gets a yeast infection with abx.   Chronic cough - patient has hx of dysphagia, cough and esophageal stricture and has been evaluated by Dr. Jarold Motto in the past. Last endoscopy was essentially normal from a GI  perspective, however did show some mucous in the vocal chords.  Pt was seen by ENT on 02/20/10 for further eval; ENT believes her cough is 2/2 thickened sputum as a result of dry mucous membranes, possible an adverse effect of one of her medications.  She will follow up with them again this Friday.    Depression History:      The patient is having a depressed mood most of the day but denies diminished interest in her usual daily activities.        The patient denies that she has thought about ending her life.        Comments:  "sometimes".  Preventive Screening-Counseling & Management  Alcohol-Tobacco     Alcohol drinks/day: 0     Smoking Status: never  Current Medications (verified): 1)  Metformin Hcl 1000 Mg Tabs (Metformin Hcl) .... Take 1 Tablet By Mouth Two Times A Day 2)  Glipizide 10 Mg  Tabs (Glipizide) .... Take 1 Tablet By Mouth Two Times A Day 3)  Diltiazem Hcl Cr 180 Mg Cp24 (Diltiazem Hcl) .... Take 1 Tablet By Mouth Once A Day 4)  Warfarin Sodium 5 Mg Tabs (Warfarin Sodium) .... Take As Directed. 5)  Atenolol 25 Mg Tabs (Atenolol) .... Take 1 Tablet By Mouth Two Times A Day 6)  Omeprazole  20 Mg Cpdr (Omeprazole) .... Take 1 Tablet By Mouth Once A Day Prn 7)  Calcium 600/vitamin D 600-400 Mg-Unit Tabs (Calcium Carbonate-Vitamin D) .... Take 1 Tablet By Mouth Two Times A Day 8)  Multivitamins Tabs (Multiple Vitamin) .... Take 1 Tablet By Mouth Once A Day 9)  Levothyroxine Sodium 75 Mcg Tabs (Levothyroxine Sodium) .... Take 1 Tablet By Mouth Once A Day 10)  Lasix 40 Mg Tabs (Furosemide) .... Take 1 Tablet By Mouth Once A Day 11)  Klor-Con 10 10 Meq Tbcr (Potassium Chloride) .... Take 1 Tablet By Mouth Two Times A Day 12)  Digitek 0.25 Mg Tabs (Digoxin) .... Take 1 Tablet By Mouth Once A Day 13)  Fish Oil 1000 Mg Caps (Omega-3 Fatty Acids) .... Take 1 Tablet By Mouth Twice A Day 14)  Relion Blood Glucose Test   Strp (Glucose Blood) .... To Test Blood Sugar Twice Daily 15)   Lancets   Misc (Lancets) .... To Test Blood Sugar Twice Daily 16)  Lisinopril 10 Mg  Tabs (Lisinopril) .... Take 1 Tablet By Mouth Once A Day 17)  Pravachol 40 Mg Tabs (Pravastatin Sodium) .... Two Tablets By Mouth Once Daily. 18)  Prozac 20 Mg Caps (Fluoxetine Hcl) .... Take 1 Tablet By Mouth Every Morning 19)  Flexeril 5 Mg Tabs (Cyclobenzaprine Hcl) .... Take 1 Tab By Mouth Every 12 Hours As Needed For Pain. 20)  Niacin 500 Mg Tabs (Niacin) .... Two Tabs By Mouth Twice A Day. 21)  Amitriptyline Hcl 50 Mg Tabs (Amitriptyline Hcl) .... Take 1 Tab By Mouth At Bedtime 22)  Vicodin 5-500 Mg Tabs (Hydrocodone-Acetaminophen) .Marland Kitchen.. 1 Tab Every 12 Hours As Needed For Pain 23)  Gabapentin 100 Mg Caps (Gabapentin) .... Take 1 Tab By Mouth At Bedtime 24)  Tricor 145 Mg Tabs (Fenofibrate) .... Take 1 Tablet By Mouth Once A Day 25)  Nystatin 100000 Unit/ml Susp (Nystatin) .... Swish and Swallow Four Times A Day 26)  Azithromycin 250 Mg Tabs (Azithromycin) .... Take 2 Pills By Mouth On The First Day, Then 1 Pill By Mouth Once Daily For 4 More Days 27)  Fluconazole 150 Mg Tabs (Fluconazole) .... Take One Pill By Mouth.  If Symptoms Are Not Improved in 48hrs, Take The Second Pill  Allergies (verified): 1)  ! Gemfibrozil (Gemfibrozil) 2)  ! Penicillin 3)  ! Codeine 4)  ! * Latex  Past History:  Past medical, surgical, family and social histories (including risk factors) reviewed for relevance to current acute and chronic problems.  Past Medical History: Reviewed history from 11/02/2008 and no changes required. Atrial fibrillation Diabetes mellitus, type II Diverticulosis, colon Hypertension Hypothyroidism Osteoarthritis Esophageal stricture, hx of Hiatal hernia, hx of, s/p Cholecystitis, hx of, s/p cholecystectomy Obstructive sleep apnea Sarcoidosis Diabetic neuropathy Atypical chest pain, hx of Benign right breast mass, hx of (papilloma) Diabetic Neuropathy Fibromyalgia  Past Surgical  History: Reviewed history from 11/12/2009 and no changes required. Hiatal hernia repair Cholecystectomy Esophageal dilation x 2 Hysterectomy Excision of milk duct of right breast -> benign (1/08) Hernia Surgery(femoral) Right foot surgery 06/2009 Left foot surgery 09/2009  Family History: Reviewed history from 11/02/2008 and no changes required. Maternal uncle w/ dementia.   Melenoma: Father Family History of Colitis/Crohn's:Mother Family History of Diabetes: Father & Sister Family History of Heart Disease: Father  Social History: Reviewed history from 11/02/2008 and no changes required. Never Smoked Alcohol use-no Drug use-no Divorced On disability  Physical Exam  General:  alert and well-developed.  VS reviewed  and wnl.  NAD Head:  normocephalic and atraumatic.   Eyes:  vision grossly intact, pupils equal, pupils round, and pupils reactive to light.   Ears:  Left canal clear.  Left TM erythematous and retracted.  RIght TM wnl Nose:  no external erythema and no nasal discharge.   Mouth:  MMM.  posterior oropharynx is erythematous with white patchy exudates.  No ulcers or other abnormal lesions present. Neck:  mild anterior cervical LAD noted.  No masses, neck is supple Lungs:  normal respiratory effort, normal breath sounds, and no crackles.  no wheezes.   Heart:  normal rate, no murmur, no gallop, no rub, no JVD, and irregular rhythm.   Msk:  normal ROM.   Neurologic:  alert & oriented X3, cranial nerves II-XII intact, strength normal in all extremities, and gait normal.   Skin:  turgor normal, color normal, no rashes, no suspicious lesions, and no petechiae.   Psych:  Oriented X3, memory intact for recent and remote, normally interactive, and good eye contact.     Impression & Recommendations:  Problem # 1:  ACUTE PHARYNGITIS (ICD-462)  Pts exam is c/w strep throat, however a viral etiology is also possible.  WIll not perform rapid strep test as pts exam and hx are  highly suggestive of strep pharyngitis and obtaining a rapid strep test will not alter management.  Pt reports PCN allergy, therefore will treat with azithromycin.  WIll provide pt with rx for fluconazle; advised her to fill it if she develops symptoms of a yeast infection.   Instructed pt to ensure she completes the entire course of abx to call the clinic/go to the ER if she develops worsening/concerning symptoms.  Her updated medication list for this problem includes:    Nystatin 100000 Unit/ml Susp (Nystatin) ..... Swish and swallow four times a day    Azithromycin 250 Mg Tabs (Azithromycin) .Marland Kitchen... Take 2 pills by mouth on the first day, then 1 pill by mouth once daily for 4 more days  Complete Medication List: 1)  Metformin Hcl 1000 Mg Tabs (Metformin hcl) .... Take 1 tablet by mouth two times a day 2)  Glipizide 10 Mg Tabs (Glipizide) .... Take 1 tablet by mouth two times a day 3)  Diltiazem Hcl Cr 180 Mg Cp24 (Diltiazem hcl) .... Take 1 tablet by mouth once a day 4)  Warfarin Sodium 5 Mg Tabs (Warfarin sodium) .... Take as directed. 5)  Atenolol 25 Mg Tabs (Atenolol) .... Take 1 tablet by mouth two times a day 6)  Omeprazole 20 Mg Cpdr (Omeprazole) .... Take 1 tablet by mouth once a day prn 7)  Calcium 600/vitamin D 600-400 Mg-unit Tabs (Calcium carbonate-vitamin d) .... Take 1 tablet by mouth two times a day 8)  Multivitamins Tabs (Multiple vitamin) .... Take 1 tablet by mouth once a day 9)  Levothyroxine Sodium 75 Mcg Tabs (Levothyroxine sodium) .... Take 1 tablet by mouth once a day 10)  Lasix 40 Mg Tabs (Furosemide) .... Take 1 tablet by mouth once a day 11)  Klor-con 10 10 Meq Tbcr (Potassium chloride) .... Take 1 tablet by mouth two times a day 12)  Digitek 0.25 Mg Tabs (Digoxin) .... Take 1 tablet by mouth once a day 13)  Fish Oil 1000 Mg Caps (Omega-3 fatty acids) .... Take 1 tablet by mouth twice a day 14)  Relion Blood Glucose Test Strp (Glucose blood) .... To test blood sugar  twice daily 15)  Lancets Misc (Lancets) .... To test  blood sugar twice daily 16)  Lisinopril 10 Mg Tabs (Lisinopril) .... Take 1 tablet by mouth once a day 17)  Pravachol 40 Mg Tabs (Pravastatin sodium) .... Two tablets by mouth once daily. 18)  Prozac 20 Mg Caps (Fluoxetine hcl) .... Take 1 tablet by mouth every morning 19)  Flexeril 5 Mg Tabs (Cyclobenzaprine hcl) .... Take 1 tab by mouth every 12 hours as needed for pain. 20)  Niacin 500 Mg Tabs (Niacin) .... Two tabs by mouth twice a day. 21)  Amitriptyline Hcl 50 Mg Tabs (Amitriptyline hcl) .... Take 1 tab by mouth at bedtime 22)  Vicodin 5-500 Mg Tabs (Hydrocodone-acetaminophen) .Marland Kitchen.. 1 tab every 12 hours as needed for pain 23)  Gabapentin 100 Mg Caps (Gabapentin) .... Take 1 tab by mouth at bedtime 24)  Tricor 145 Mg Tabs (Fenofibrate) .... Take 1 tablet by mouth once a day 25)  Nystatin 100000 Unit/ml Susp (Nystatin) .... Swish and swallow four times a day 26)  Azithromycin 250 Mg Tabs (Azithromycin) .... Take 2 pills by mouth on the first day, then 1 pill by mouth once daily for 4 more days 27)  Fluconazole 150 Mg Tabs (Fluconazole) .... Take one pill by mouth.  if symptoms are not improved in 48hrs, take the second pill   Patient Instructions: 1)  Please schedule a follow-up appointment as needed. 2)  If your symptoms do not improve after 7 days, or if you develop difficulty breathing, inability to swallow, or other concerning symptoms call the clinic at [phone] or go to the nearest emergency room. 3)  Azithromycin is an antibiotic to treat your strep throat.  Be sure to take ALL of the pills as directed to be sure your infection is treated. 4)  Fluconazole is a medicine to treat yeast infections associated with antibiotics.  Use as directed. 5)  Try over the counter Cepacol lozenges for relief of your sore throat. 6)  Be sure to drink plenty of fluids and get rest. Prescriptions: FLUCONAZOLE 150 MG TABS (FLUCONAZOLE) Take one pill  by mouth.  If symptoms are not improved in 48hrs, take the second pill  #2 x 1   Entered and Authorized by:   Nelda Bucks DO   Signed by:   Nelda Bucks DO on 03/11/2010   Method used:   Electronically to        [facility]* (retail)       [address]       [place], [place]  [address]       Ph: [phone]       Fax: [phone]   RxID:   [id number] AZITHROMYCIN 250 MG TABS (AZITHROMYCIN) Take 2 pills by mouth on the first day, then 1 pill by mouth once daily for 4 more days  #6 x 0   Entered and Authorized by:   Nelda Bucks DO   Signed by:   Nelda Bucks DO on 03/11/2010   Method used:   Electronically to        [facility]* (retail)       [address]       [place], [place]  [address]       Ph: [phone]       Fax: [phone]   RxID:   [id number]    Orders Added: 1)  Est. Patient Level III [32[address]]     Prevention &  Chronic Care Immunizations   Influenza vaccine: Fluvax MCR  (11/07/2009)   Influenza vaccine deferral: Not available  (08/24/2008)   Influenza vaccine due: 11/07/2008    Tetanus booster: Not documented   Td booster deferral: Deferred  (08/24/2008)    Pneumococcal vaccine: Not documented   Pneumococcal vaccine deferral: Deferred  (08/24/2008)    H. zoster vaccine: Not documented   H. zoster vaccine deferral: Deferred  (08/24/2008)  Colorectal Screening   Hemoccult: Not documented   Hemoccult action/deferral: Ordered  (08/24/2008)    Colonoscopy: Location:  [facility].    (10/22/2008)   Colonoscopy action/deferral: Repeat colonoscopy in 7 years. 2012  (08/22/2003)   Colonoscopy due: 05/2010  Other Screening   Pap smear: Not documented   Pap smear action/deferral: Not indicated S/P hysterectomy  (08/24/2008)    Mammogram: ASSESSMENT: Negative - BI-RADS 1^MM DIGITAL SCREENING  (11/22/2009)   Mammogram due: 10/2008    DXA bone  density scan:  Lumbar Spine:  T Score > -1.0 Spine.  Hip Total: T Score -2.5 to -1.0 Hip.      (03/20/2008)   DXA scan due: 03/2010    Smoking status: never  (03/11/2010)  Diabetes Mellitus   HgbA1C: 7.4  (02/06/2010)   HgbA1C action/deferral: Ordered  (12/04/2008)    Eye exam: Not documented   Diabetic eye exam action/deferral: Deferred  (05/09/2009)    Foot exam: yes  (02/06/2010)   Foot exam action/deferral: Do today   High risk foot: Yes  (02/06/2010)   Foot care education: Done  (02/06/2010)   Foot exam due: 03/13/2009    Urine microalbumin/creatinine ratio: 23.8  (12/15/2007)   Urine microalbumin action/deferral: Not indicated  Lipids   Total Cholesterol: 118  (02/07/2010)   Lipid panel action/deferral: Lipid Panel ordered   LDL: 43  (02/07/2010)   LDL Direct: Not documented   HDL: 31  (02/07/2010)   Triglycerides: 219  (02/07/2010)    SGOT (AST): 44  (02/07/2010)   SGPT (ALT): 43  (02/07/2010)   Alkaline phosphatase: 74  (02/07/2010)   Total bilirubin: 1.0  (02/07/2010)  Hypertension   Last Blood Pressure: 130 / 73  (03/11/2010)   Serum creatinine: 0.66  (02/07/2010)   BMP action: Ordered   Serum potassium 4.0  (02/07/2010)  Self-Management Support :   Personal Goals (by the next clinic visit) :     Personal A1C goal: 7  (11/07/2009)     Personal blood pressure goal: 130/80  (11/07/2009)     Personal LDL goal: 100  (02/06/2010)    Diabetes self-management support: Written self-care plan  (03/11/2010)   Diabetes care plan printed   Last diabetes self-management training by diabetes educator: 07/25/2007   Last medical nutrition therapy: 03/08/2007    Hypertension self-management support: Written self-care plan  (03/11/2010)   Hypertension self-care plan printed.    Lipid self-management support: Written self-care plan  (03/11/2010)   Lipid self-care plan printed.

## 2010-04-10 NOTE — Progress Notes (Signed)
Summary: call/hla  Phone Note Call from Patient   Summary of Call: pt called left message that she needed "something" called in, did not say what, rtc, got vmail, lm to rtc Initial call taken by: Angelina Ok RN,  March 25, 2010 4:36 PM

## 2010-04-10 NOTE — Consult Note (Signed)
Summary: [facility]   Imported By: Margie Billet 03/26/2010 11:18:00  _____________________________________________________________________  External Attachment:    Type:   Image     Comment:   External Document

## 2010-04-10 NOTE — Progress Notes (Signed)
Summary: refill/gg  Phone Note Refill Request  on February 14, 2010 4:40 PM  Refills Requested: Medication #1:  WARFARIN SODIUM 5 MG TABS Take as directed.   Last Refilled: 01/18/2010  Method Requested: Electronic Initial call taken by: Merrie Roof RN,  February 14, 2010 4:40 PM  Follow-up for Phone Call        Last INR 2.7 checked 02/14/2010 Will refill  Follow-up by: Melida Quitter MD,  February 14, 2010 4:50 PM    Prescriptions: WARFARIN SODIUM 5 MG TABS (WARFARIN SODIUM) Take as directed.  #30 x 3   Entered and Authorized by:   Melida Quitter MD   Signed by:   Melida Quitter MD on 02/14/2010   Method used:   Electronically to        [facility]* (retail)       [address]       [place], [place]  [address]       Ph: [phone]       Fax: [phone]   RxID:   [id number]

## 2010-04-10 NOTE — Medication Information (Signed)
Summary: ROV  Anticoagulant Therapy  Managed by: Cloyde Reams, RN, BSN Referring MD: Rollene Rotunda MD PCP: Melida Quitter MD Supervising MD: Ladona Ridgel MD, Sharlot Gowda Indication 1: Atrial Fibrillation (ICD-427.31) Lab Used: [facility] Site: [facility] INR POC 3.0 INR RANGE 2-3  Dietary changes: no    Health status changes: yes       Details: strep throat last week  Bleeding/hemorrhagic complications: yes       Details: had nose cauterized today by ENT.   Recent/future hospitalizations: no    Any changes in medication regimen? yes       Details: Azithromycin 250mg  zpack 03/11/10, took Diflucan 150mg  x 1 dose  Recent/future dental: no  Any missed doses?: no       Is patient compliant with meds? yes       Allergies: 1)  ! Gemfibrozil (Gemfibrozil) 2)  ! Penicillin 3)  ! Codeine 4)  ! * Latex  Anticoagulation Management History:      The patient is taking warfarin and comes in today for a routine follow up visit.  Positive risk factors for bleeding include history of CVA/TIA and presence of serious comorbidities.  Negative risk factors for bleeding include an age less than 13 years old.  The bleeding index is 'intermediate risk'.  Positive CHADS2 values include History of HTN, History of Diabetes, and Prior Stroke/CVA/TIA.  Negative CHADS2 values include Age > 91 years old.  The start date was 07/22/2004.  Her last INR was 1.6.  Anticoagulation responsible provider: Ladona Ridgel MD, Sharlot Gowda.  INR POC: 3.0.  Cuvette Lot#: [id number].  Exp: 04/2011.    Anticoagulation Management Assessment/Plan:      The patient's current anticoagulation dose is Warfarin sodium 5 mg tabs: Take as directed..  The target INR is 2 - 3.  The next INR is due 04/17/2010.  Anticoagulation instructions were given to patient.  Results were reviewed/authorized by Cloyde Reams, RN, BSN.  She was notified by Cloyde Reams RN.         Prior Anticoagulation Instructions: INR:  2.7   Your INR is at goal today.  Please  continue to take your current doses of Coumadin at 0.5 tablet on Tuesday/Saturday and 1 tablet other days of the week.    Please return to clinic in 4 weeks for another INR check.  Current Anticoagulation Instructions: INR 3.0  Take 1/2 tablet today, then resume same dosage 1 tablet daily except 1/2 tablet on Tuesdays and Saturdays.  Recheck in 4 weeks.

## 2010-04-10 NOTE — Assessment & Plan Note (Signed)
Summary: EAR/SB.   Vital Signs:  Patient profile:   60 year old female Menstrual status:  postmenopausal Height:      63 inches (160.02 cm) Weight:      199.03 pounds (90.47 kg) BMI:     35.38 Temp:     97.2 degrees F (36.22 degrees C) oral Pulse rate:   91 / minute BP sitting:   111 / 65  (right arm) Cuff size:   regular  Vitals Entered By: Angelina Ok RN (March 27, 2010 1:49 PM) CC: cough/uri symptoms Is Patient Diabetic? Yes Did you bring your meter with you today? No Pain Assessment Patient in pain? yes     Location: ear, throat Intensity: 8 Type: sore Nutritional Status BMI of > 30 = obese CBG Result 189  Have you ever been in a relationship where you felt threatened, hurt or afraid?No   Does patient need assistance? Functional Status Self care Ambulation Normal Comments Coughing.  Blood tinged.  Has cleared this pm.  Ear pain.  Fevers, chills. Sore throat and ear aches.   Primary Care Provider:  Melida Quitter MD  CC:  cough/uri symptoms.  History of Present Illness: 60 y/o w was recenty seen on 03/11/09 for upper respiratory tract infection which was thought to be strp pharyngitis and was treated with azithromycin given her penicillin allergy.   She completed the course the z-pack. She felt a little better after that but than her ears started hurting and now she has some soreness in throt and cough. she had cough before as well but it got worse with this urti. she has been coughing up yellowish sputum and had some blood in it today morning. She says she has been having chills but denies fever. No chest pain but does have some shortness of breath. no smoking, no sick contacts since the last visit.     Depression History:      The patient denies a depressed mood most of the day and a diminished interest in her usual daily activities.  The patient denies symptoms of a manic disorder including inflated self-esteem or grandiosity.         Preventive  Screening-Counseling & Management  Alcohol-Tobacco     Alcohol drinks/day: 0     Smoking Status: never  Current Medications (verified): 1)  Metformin Hcl 1000 Mg Tabs (Metformin Hcl) .... Take 1 Tablet By Mouth Two Times A Day 2)  Glipizide 10 Mg  Tabs (Glipizide) .... Take 1 Tablet By Mouth Two Times A Day 3)  Diltiazem Hcl Cr 180 Mg Cp24 (Diltiazem Hcl) .... Take 1 Tablet By Mouth Once A Day 4)  Warfarin Sodium 5 Mg Tabs (Warfarin Sodium) .... Take As Directed. 5)  Atenolol 25 Mg Tabs (Atenolol) .... Take 1 Tablet By Mouth Two Times A Day 6)  Omeprazole 20 Mg Cpdr (Omeprazole) .... Take 1 Tablet By Mouth Once A Day Prn 7)  Calcium 600/vitamin D 600-400 Mg-Unit Tabs (Calcium Carbonate-Vitamin D) .... Take 1 Tablet By Mouth Two Times A Day 8)  Multivitamins Tabs (Multiple Vitamin) .... Take 1 Tablet By Mouth Once A Day 9)  Levothyroxine Sodium 75 Mcg Tabs (Levothyroxine Sodium) .... Take 1 Tablet By Mouth Once A Day 10)  Lasix 40 Mg Tabs (Furosemide) .... Take 1 Tablet By Mouth Once A Day 11)  Klor-Con 10 10 Meq Tbcr (Potassium Chloride) .... Take 1 Tablet By Mouth Two Times A Day 12)  Digitek 0.25 Mg Tabs (Digoxin) .... Take 1 Tablet By  Mouth Once A Day 13)  Fish Oil 1000 Mg Caps (Omega-3 Fatty Acids) .... Take 1 Tablet By Mouth Twice A Day 14)  Relion Blood Glucose Test   Strp (Glucose Blood) .... To Test Blood Sugar Twice Daily 15)  Lancets   Misc (Lancets) .... To Test Blood Sugar Twice Daily 16)  Lisinopril 10 Mg  Tabs (Lisinopril) .... Take 1 Tablet By Mouth Once A Day 17)  Pravachol 40 Mg Tabs (Pravastatin Sodium) .... Two Tablets By Mouth Once Daily. 18)  Prozac 20 Mg Caps (Fluoxetine Hcl) .... Take 1 Tablet By Mouth Every Morning 19)  Flexeril 5 Mg Tabs (Cyclobenzaprine Hcl) .... Take 1 Tab By Mouth Every 12 Hours As Needed For Pain. 20)  Niacin 500 Mg Tabs (Niacin) .... Two Tabs By Mouth Twice A Day. 21)  Amitriptyline Hcl 50 Mg Tabs (Amitriptyline Hcl) .... Take 1 Tab By Mouth  At Bedtime 22)  Vicodin 5-500 Mg Tabs (Hydrocodone-Acetaminophen) .Marland Kitchen.. 1 Tab Every 12 Hours As Needed For Pain 23)  Gabapentin 100 Mg Caps (Gabapentin) .... Take 1 Tab By Mouth At Bedtime 24)  Tricor 145 Mg Tabs (Fenofibrate) .... Take 1 Tablet By Mouth Once A Day 25)  Nystatin 100000 Unit/ml Susp (Nystatin) .... Swish and Swallow Four Times A Day 26)  Fluconazole 150 Mg Tabs (Fluconazole) .... Take One Pill By Mouth.  If Symptoms Are Not Improved in 48hrs, Take The Second Pill  Allergies (verified): 1)  ! Gemfibrozil (Gemfibrozil) 2)  ! Penicillin 3)  ! Codeine 4)  ! * Latex  Review of Systems       The patient complains of hoarseness, dyspnea on exertion, and prolonged cough.  The patient denies anorexia, fever, weight loss, weight gain, vision loss, decreased hearing, syncope, peripheral edema, headaches, hemoptysis, abdominal pain, melena, hematochezia, severe indigestion/heartburn, hematuria, incontinence, genital sores, muscle weakness, suspicious skin lesions, transient blindness, difficulty walking, depression, unusual weight change, abnormal bleeding, enlarged lymph nodes, angioedema, breast masses, and testicular masses.    Physical Exam  General:  Gen: VS reveiwed, Alert, well developed, nodistress ENT: mucous membranes pink & moist. No abnormal finds in ear and nose. pharyngeal exudate.  CVC:S1 S2 , no murmurs, no abnormal heart sounds. Lungs:LLL corsenessL. No wheezes, crackles or other abnormal sounds Abdomen: soft, non distended, no tender. Normal Bowel sounds EXT: no pitting edema, no engorged veins, Pulsations normal  Neuro:alert, oriented *3, cranial nerved 2-12 intact, strenght normal in all  extremities, senstations normal to light touch.      Impression & Recommendations:  Problem # 1:  ACUTE PHARYNGITIS (ICD-462) unresolved z-pack made her feel better for somethime but she has been having prductive cough with 2 episdoe of hemoptysis, ear ache, sore throat,  wheezing and pharyngeal congestion with left lower lobe coarse breath sound on physical exam  plan -CXR - bactrim for 5 days - albuterol for wheezing - rtc if not better in 1 week  The following medications were removed from the medication list:    Azithromycin 250 Mg Tabs (Azithromycin) .Marland Kitchen... Take 2 pills by mouth on the first day, then 1 pill by mouth once daily for 4 more days Her updated medication list for this problem includes:    Nystatin 100000 Unit/ml Susp (Nystatin) ..... Swish and swallow four times a day    Bactrim Ds 800-160 Mg Tabs (Sulfamethoxazole-trimethoprim) .Marland Kitchen... 1 tablets twice a day for 5 days  Orders: CXR- 2view (CXR)  Complete Medication List: 1)  Metformin Hcl 1000 Mg Tabs (Metformin hcl) .Marland KitchenMarland KitchenMarland Kitchen  Take 1 tablet by mouth two times a day 2)  Glipizide 10 Mg Tabs (Glipizide) .... Take 1 tablet by mouth two times a day 3)  Diltiazem Hcl Cr 180 Mg Cp24 (Diltiazem hcl) .... Take 1 tablet by mouth once a day 4)  Warfarin Sodium 5 Mg Tabs (Warfarin sodium) .... Take as directed. 5)  Atenolol 25 Mg Tabs (Atenolol) .... Take 1 tablet by mouth two times a day 6)  Omeprazole 20 Mg Cpdr (Omeprazole) .... Take 1 tablet by mouth once a day prn 7)  Calcium 600/vitamin D 600-400 Mg-unit Tabs (Calcium carbonate-vitamin d) .... Take 1 tablet by mouth two times a day 8)  Multivitamins Tabs (Multiple vitamin) .... Take 1 tablet by mouth once a day 9)  Levothyroxine Sodium 75 Mcg Tabs (Levothyroxine sodium) .... Take 1 tablet by mouth once a day 10)  Lasix 40 Mg Tabs (Furosemide) .... Take 1 tablet by mouth once a day 11)  Klor-con 10 10 Meq Tbcr (Potassium chloride) .... Take 1 tablet by mouth two times a day 12)  Digitek 0.25 Mg Tabs (Digoxin) .... Take 1 tablet by mouth once a day 13)  Fish Oil 1000 Mg Caps (Omega-3 fatty acids) .... Take 1 tablet by mouth twice a day 14)  Relion Blood Glucose Test Strp (Glucose blood) .... To test blood sugar twice daily 15)  Lancets Misc (Lancets)  .... To test blood sugar twice daily 16)  Lisinopril 10 Mg Tabs (Lisinopril) .... Take 1 tablet by mouth once a day 17)  Pravachol 40 Mg Tabs (Pravastatin sodium) .... Two tablets by mouth once daily. 18)  Prozac 20 Mg Caps (Fluoxetine hcl) .... Take 1 tablet by mouth every morning 19)  Flexeril 5 Mg Tabs (Cyclobenzaprine hcl) .... Take 1 tab by mouth every 12 hours as needed for pain. 20)  Niacin 500 Mg Tabs (Niacin) .... Two tabs by mouth twice a day. 21)  Amitriptyline Hcl 50 Mg Tabs (Amitriptyline hcl) .... Take 1 tab by mouth at bedtime 22)  Vicodin 5-500 Mg Tabs (Hydrocodone-acetaminophen) .Marland Kitchen.. 1 tab every 12 hours as needed for pain 23)  Gabapentin 100 Mg Caps (Gabapentin) .... Take 1 tab by mouth at bedtime 24)  Tricor 145 Mg Tabs (Fenofibrate) .... Take 1 tablet by mouth once a day 25)  Nystatin 100000 Unit/ml Susp (Nystatin) .... Swish and swallow four times a day 26)  Fluconazole 150 Mg Tabs (Fluconazole) .... Take one pill by mouth.  if symptoms are not improved in 48hrs, take the second pill 27)  Ventolin Hfa 108 (90 Base) Mcg/act Aers (Albuterol sulfate) .Marland Kitchen.. 12 puff every 4 hrs as needed for cough or shortness of breath 28)  Bactrim Ds 800-160 Mg Tabs (Sulfamethoxazole-trimethoprim) .Marland Kitchen.. 1 tablets twice a day for 5 days  Other Orders: Capillary Blood Glucose/CBG (16109)  Patient Instructions: 1)  Please schedule a follow-up appointment in 1 month. 2)  Get plenty of rest, drink lots of clear liquids, and use Tylenol or Ibuprofen for fever and comfort. Return in 7-10 days if you're not better:sooner if you're feeling worse.  Prescriptions: NYSTATIN 100000 UNIT/ML SUSP (NYSTATIN) swish and swallow four times a day  #1 pint x 0   Entered and Authorized by:   Bethel Born MD   Signed by:   Bethel Born MD on 03/27/2010   Method used:   Print then Give to Patient   RxID:   [id number] FLUCONAZOLE 150 MG TABS (FLUCONAZOLE) Take one pill by mouth.  If symptoms are not  improved in 48hrs, take the second pill  #2 x 1   Entered and Authorized by:   Bethel Born MD   Signed by:   Bethel Born MD on 03/27/2010   Method used:   Print then Give to Patient   RxID:   [id number] VICODIN 5-500 MG TABS (HYDROCODONE-ACETAMINOPHEN) 1 tab every 12 hours as needed for pain  #60 x 0   Entered and Authorized by:   Bethel Born MD   Signed by:   Bethel Born MD on 03/27/2010   Method used:   Print then Give to Patient   RxID:   [id number] BACTRIM DS 800-160 MG TABS (SULFAMETHOXAZOLE-TRIMETHOPRIM) 1 tablets twice a day for 5 days  #10 x 0   Entered and Authorized by:   Bethel Born MD   Signed by:   Bethel Born MD on 03/27/2010   Method used:   Print then Give to Patient   RxID:   [id number] VENTOLIN HFA 108 (90 BASE) MCG/ACT AERS (ALBUTEROL SULFATE) 12 puff every 4 hrs as needed for cough or shortness of breath  #1 x 0   Entered and Authorized by:   Bethel Born MD   Signed by:   Bethel Born MD on 03/27/2010   Method used:   Print then Give to Patient   RxID:   [id number]    Orders Added: 1)  Capillary Blood Glucose/CBG [62831] 2)  Est. Patient Level IV [51761] 3)  CXR- 2view [CXR]    Prevention & Chronic Care Immunizations   Influenza vaccine: Fluvax MCR  (11/07/2009)   Influenza vaccine deferral: Not available  (08/24/2008)   Influenza vaccine due: 11/07/2008    Tetanus booster: Not documented   Td booster deferral: Deferred  (08/24/2008)    Pneumococcal vaccine: Not documented   Pneumococcal vaccine deferral: Deferred  (08/24/2008)    H. zoster vaccine: Not documented   H. zoster vaccine deferral: Deferred  (08/24/2008)  Colorectal Screening   Hemoccult: Not documented   Hemoccult action/deferral: Ordered  (08/24/2008)    Colonoscopy: Location:  [facility].    (10/22/2008)   Colonoscopy action/deferral: Repeat colonoscopy in 7 years. 2012  (08/22/2003)   Colonoscopy due: 05/2010  Other  Screening   Pap smear: Not documented   Pap smear action/deferral: Not indicated S/P hysterectomy  (08/24/2008)    Mammogram: ASSESSMENT: Negative - BI-RADS 1^MM DIGITAL SCREENING  (11/22/2009)   Mammogram due: 10/2008    DXA bone density scan:  Lumbar Spine:  T Score > -1.0 Spine.  Hip Total: T Score -2.5 to -1.0 Hip.      (03/20/2008)   DXA scan due: 03/2010    Smoking status: never  (03/27/2010)  Diabetes Mellitus   HgbA1C: 7.4  (02/06/2010)   HgbA1C action/deferral: Ordered  (12/04/2008)    Eye exam: Not documented   Diabetic eye exam action/deferral: Deferred  (05/09/2009)    Foot exam: yes  (02/06/2010)   Foot exam action/deferral: Do today   High risk foot: Yes  (02/06/2010)   Foot care education: Done  (02/06/2010)   Foot exam due: 03/13/2009    Urine microalbumin/creatinine ratio: 23.8  (12/15/2007)   Urine microalbumin action/deferral: Not indicated  Lipids   Total Cholesterol: 118  (02/07/2010)   Lipid panel action/deferral: Lipid Panel ordered   LDL: 43  (02/07/2010)   LDL Direct: Not documented   HDL: 31  (02/07/2010)   Triglycerides: 219  (02/07/2010)    SGOT (AST): 44  (02/07/2010)   SGPT (ALT): 43  (02/07/2010)  Alkaline phosphatase: 74  (02/07/2010)   Total bilirubin: 1.0  (02/07/2010)  Hypertension   Last Blood Pressure: 111 / 65  (03/27/2010)   Serum creatinine: 0.66  (02/07/2010)   BMP action: Ordered   Serum potassium 4.0  (02/07/2010)  Self-Management Support :   Personal Goals (by the next clinic visit) :     Personal A1C goal: 7  (11/07/2009)     Personal blood pressure goal: 130/80  (11/07/2009)     Personal LDL goal: 100  (02/06/2010)    Patient will work on the following items until the next clinic visit to reach self-care goals:     Medications and monitoring: take my medicines every day, check my blood sugar, bring all of my medications to every visit, examine my feet every day  (03/27/2010)     Eating: drink diet soda or water  instead of juice or soda, eat more vegetables, use fresh or frozen vegetables, eat foods that are low in salt, eat baked foods instead of fried foods, eat fruit for snacks and desserts, limit or avoid alcohol  (03/27/2010)     Activity: take a 30 minute walk every day  (03/27/2010)    Diabetes self-management support: Education handout, Psychologist, forensic, Resources for patients handout  (03/27/2010)   Diabetes education handout printed   Last diabetes self-management training by diabetes educator: 07/25/2007   Last medical nutrition therapy: 03/08/2007    Hypertension self-management support: Education handout, Pre-printed educational material, Written self-care plan, Resources for patients handout  (03/27/2010)   Hypertension self-care plan printed.   Hypertension education handout printed    Lipid self-management support: Education handout, Pre-printed educational material, Written self-care plan, Resources for patients handout  (03/27/2010)   Lipid self-care plan printed.   Lipid education handout printed      Resource handout printed.

## 2010-04-10 NOTE — Consult Note (Signed)
Summary: [facility]  [facility]/ NOSE/ THROAT   Imported By: Margie Billet 03/19/2010 11:36:31  _____________________________________________________________________  External Attachment:    Type:   Image     Comment:   External Document

## 2010-04-10 NOTE — Progress Notes (Signed)
Summary: appt/ hla  Phone Note Call from Patient   Summary of Call: pt calls and requests appt today, has been hoarse x 3 days, ? fever, has not checked temp, thinks she has bronchitis, general malaise. appt given for 1100, lives 1 hr away and will be here as close to 1100 as possible. Initial call taken by: Marin Roberts RN,  March 11, 2010 9:51 AM  Follow-up for Phone Call        Thank you! Follow-up by: Nelda Bucks DO,  March 11, 2010 10:00 AM

## 2010-04-10 NOTE — Consult Note (Signed)
Summary: [facility]  [facility]/ NOSE/THROAT   Imported By: Margie Billet 03/04/2010 14:57:17  _____________________________________________________________________  External Attachment:    Type:   Image     Comment:   External Document

## 2010-04-12 ENCOUNTER — Other Ambulatory Visit: Payer: Self-pay | Admitting: *Deleted

## 2010-04-14 MED ORDER — PRAVASTATIN SODIUM 80 MG PO TABS: 80.0000 mg | ORAL_TABLET | Freq: Every day | ORAL | Status: DC

## 2010-04-14 MED ORDER — LEVOTHYROXINE SODIUM 75 MCG PO CAPS: 75.0000 ug | ORAL_CAPSULE | Freq: Every day | ORAL | Status: DC

## 2010-04-14 MED ORDER — FUROSEMIDE 40 MG PO TABS: 40.0000 mg | ORAL_TABLET | Freq: Every day | ORAL | Status: DC

## 2010-04-14 MED ORDER — ATENOLOL 25 MG PO TABS: 25.0000 mg | ORAL_TABLET | Freq: Two times a day (BID) | ORAL | Status: DC

## 2010-04-14 NOTE — Telephone Encounter (Signed)
Meds for [name] approved for refill.

## 2010-04-17 ENCOUNTER — Encounter (INDEPENDENT_AMBULATORY_CARE_PROVIDER_SITE_OTHER): Payer: Medicare Other

## 2010-04-17 ENCOUNTER — Encounter: Payer: Self-pay | Admitting: Internal Medicine

## 2010-04-17 DIAGNOSIS — Z7901 Long term (current) use of anticoagulants: Secondary | ICD-10-CM

## 2010-04-17 DIAGNOSIS — I4891 Unspecified atrial fibrillation: Secondary | ICD-10-CM

## 2010-04-24 NOTE — Medication Information (Signed)
Summary: [facility]  Anticoagulant Therapy  Managed by: Eda Keys, PharmD Referring MD: Rollene Rotunda MD PCP: Melida Quitter MD Supervising MD: Johney Frame MD, Fayrene Fearing Indication 1: Atrial Fibrillation (ICD-427.31) Lab Used: [facility] Site: [facility] INR POC 3.8 INR RANGE 2-3  Dietary changes: no    Health status changes: no    Bleeding/hemorrhagic complications: no    Recent/future hospitalizations: no    Any changes in medication regimen? yes       Details: Pt was recently on an abx course, but finished this 10+ days ago  Recent/future dental: no  Any missed doses?: no       Is patient compliant with meds? yes       Current Medications (verified): 1)  Metformin Hcl 1000 Mg Tabs (Metformin Hcl) .... Take 1 Tablet By Mouth Two Times A Day 2)  Glipizide 10 Mg  Tabs (Glipizide) .... Take 1 Tablet By Mouth Two Times A Day 3)  Diltiazem Hcl Cr 180 Mg Cp24 (Diltiazem Hcl) .... Take 1 Tablet By Mouth Once A Day 4)  Warfarin Sodium 5 Mg Tabs (Warfarin Sodium) .... Take As Directed. 5)  Atenolol 25 Mg Tabs (Atenolol) .... Take 1 Tablet By Mouth Two Times A Day 6)  Omeprazole 20 Mg Cpdr (Omeprazole) .... Take 1 Tablet By Mouth Once A Day Prn 7)  Calcium 600/vitamin D 600-400 Mg-Unit Tabs (Calcium Carbonate-Vitamin D) .... Take 1 Tablet By Mouth Two Times A Day 8)  Multivitamins Tabs (Multiple Vitamin) .... Take 1 Tablet By Mouth Once A Day 9)  Levothyroxine Sodium 75 Mcg Tabs (Levothyroxine Sodium) .... Take 1 Tablet By Mouth Once A Day 10)  Lasix 40 Mg Tabs (Furosemide) .... Take 1 Tablet By Mouth Once A Day 11)  Klor-Con 10 10 Meq Tbcr (Potassium Chloride) .... Take 1 Tablet By Mouth Two Times A Day 12)  Digitek 0.25 Mg Tabs (Digoxin) .... Take 1 Tablet By Mouth Once A Day 13)  Fish Oil 1000 Mg Caps (Omega-3 Fatty Acids) .... Take 1 Tablet By Mouth Twice A Day 14)  Relion Blood Glucose Test   Strp (Glucose Blood) .... To Test Blood Sugar Twice Daily 15)  Lancets   Misc  (Lancets) .... To Test Blood Sugar Twice Daily 16)  Lisinopril 10 Mg  Tabs (Lisinopril) .... Take 1 Tablet By Mouth Once A Day 17)  Pravachol 40 Mg Tabs (Pravastatin Sodium) .... Two Tablets By Mouth Once Daily. 18)  Prozac 20 Mg Caps (Fluoxetine Hcl) .... Take 1 Tablet By Mouth Every Morning 19)  Flexeril 5 Mg Tabs (Cyclobenzaprine Hcl) .... Take 1 Tab By Mouth Every 12 Hours As Needed For Pain. 20)  Niacin 500 Mg Tabs (Niacin) .... Two Tabs By Mouth Twice A Day. 21)  Amitriptyline Hcl 50 Mg Tabs (Amitriptyline Hcl) .... Take 1 Tab By Mouth At Bedtime 22)  Vicodin 5-500 Mg Tabs (Hydrocodone-Acetaminophen) .Marland Kitchen.. 1 Tab Every 12 Hours As Needed For Pain 23)  Gabapentin 100 Mg Caps (Gabapentin) .... Take 1 Tab By Mouth At Bedtime 24)  Tricor 145 Mg Tabs (Fenofibrate) .... Take 1 Tablet By Mouth Once A Day 25)  Nystatin 100000 Unit/ml Susp (Nystatin) .... Swish and Swallow Four Times A Day 26)  Fluconazole 150 Mg Tabs (Fluconazole) .... Take One Pill By Mouth.  If Symptoms Are Not Improved in 48hrs, Take The Second Pill 27)  Ventolin Hfa 108 (90 Base) Mcg/act Aers (Albuterol Sulfate) .Marland Kitchen.. 12 Puff Every 4 Hrs As Needed For Cough or Shortness of Breath  28)  Bactrim Ds 800-160 Mg Tabs (Sulfamethoxazole-Trimethoprim) .Marland Kitchen.. 1 Tablets Twice A Day For 5 Days  Allergies (verified): 1)  ! Gemfibrozil (Gemfibrozil) 2)  ! Penicillin 3)  ! Codeine 4)  ! * Latex  Anticoagulation Management History:      The patient is taking warfarin and comes in today for a routine follow up visit.  Positive risk factors for bleeding include history of CVA/TIA and presence of serious comorbidities.  Negative risk factors for bleeding include an age less than 49 years old.  The bleeding index is 'intermediate risk'.  Positive CHADS2 values include History of HTN, History of Diabetes, and Prior Stroke/CVA/TIA.  Negative CHADS2 values include Age > 75 years old.  The start date was 07/22/2004.  Her last INR was 1.6.  Anticoagulation  responsible provider: Denay Pleitez MD, Fayrene Fearing.  INR POC: 3.8.  Cuvette Lot#: [id number].  Exp: 04/2011.    Anticoagulation Management Assessment/Plan:      The patient's current anticoagulation dose is Warfarin sodium 5 mg tabs: Take as directed..  The target INR is 2 - 3.  The next INR is due 05/08/2010.  Anticoagulation instructions were given to patient.  Results were reviewed/authorized by Eda Keys, PharmD.  She was notified by Eda Keys.         Prior Anticoagulation Instructions: INR 3.0  Take 1/2 tablet today, then resume same dosage 1 tablet daily except 1/2 tablet on Tuesdays and Saturdays.  Recheck in 4 weeks.    Current Anticoagulation Instructions: INR 3.8  Do NOT take coumadin today.  Then return to normal dosing schedule of 1/2 tablet on Tuesday and Saturday and 1 tablet all other days.  Return to clinic in 3 weeks.

## 2010-04-28 ENCOUNTER — Encounter: Payer: Self-pay | Admitting: Ophthalmology

## 2010-04-28 ENCOUNTER — Ambulatory Visit (INDEPENDENT_AMBULATORY_CARE_PROVIDER_SITE_OTHER): Payer: Medicare Other | Admitting: Ophthalmology

## 2010-04-28 DIAGNOSIS — I10 Essential (primary) hypertension: Secondary | ICD-10-CM

## 2010-04-28 DIAGNOSIS — M545 Low back pain, unspecified: Secondary | ICD-10-CM

## 2010-04-28 DIAGNOSIS — G459 Transient cerebral ischemic attack, unspecified: Secondary | ICD-10-CM

## 2010-04-28 DIAGNOSIS — Z0000 Encounter for general adult medical examination without abnormal findings: Secondary | ICD-10-CM | POA: Insufficient documentation

## 2010-04-28 DIAGNOSIS — E785 Hyperlipidemia, unspecified: Secondary | ICD-10-CM

## 2010-04-28 DIAGNOSIS — E119 Type 2 diabetes mellitus without complications: Secondary | ICD-10-CM

## 2010-04-28 DIAGNOSIS — B370 Candidal stomatitis: Secondary | ICD-10-CM

## 2010-04-28 DIAGNOSIS — E669 Obesity, unspecified: Secondary | ICD-10-CM

## 2010-04-28 DIAGNOSIS — I4891 Unspecified atrial fibrillation: Secondary | ICD-10-CM

## 2010-04-28 DIAGNOSIS — K219 Gastro-esophageal reflux disease without esophagitis: Secondary | ICD-10-CM

## 2010-04-28 DIAGNOSIS — Z23 Encounter for immunization: Secondary | ICD-10-CM

## 2010-04-28 DIAGNOSIS — M25511 Pain in right shoulder: Secondary | ICD-10-CM | POA: Insufficient documentation

## 2010-04-28 DIAGNOSIS — E039 Hypothyroidism, unspecified: Secondary | ICD-10-CM

## 2010-04-28 DIAGNOSIS — M25519 Pain in unspecified shoulder: Secondary | ICD-10-CM

## 2010-04-28 LAB — GLUCOSE, CAPILLARY: Glucose-Capillary: 180 mg/dL — ABNORMAL HIGH (ref 70–99)

## 2010-04-28 MED ORDER — FLUOXETINE HCL 20 MG PO CAPS: 20.0000 mg | ORAL_CAPSULE | Freq: Every day | ORAL | Status: DC

## 2010-04-28 MED ORDER — FUROSEMIDE 40 MG PO TABS: 40.0000 mg | ORAL_TABLET | Freq: Every day | ORAL | Status: DC

## 2010-04-28 MED ORDER — PRAVASTATIN SODIUM 80 MG PO TABS: 80.0000 mg | ORAL_TABLET | Freq: Every day | ORAL | Status: DC

## 2010-04-28 MED ORDER — AMITRIPTYLINE HCL 50 MG PO TABS: 50.0000 mg | ORAL_TABLET | Freq: Every day | ORAL | Status: DC

## 2010-04-28 MED ORDER — LEVOTHYROXINE SODIUM 75 MCG PO CAPS: 75.0000 ug | ORAL_CAPSULE | Freq: Every day | ORAL | Status: DC

## 2010-04-28 MED ORDER — ATENOLOL 25 MG PO TABS: 25.0000 mg | ORAL_TABLET | Freq: Two times a day (BID) | ORAL | Status: DC

## 2010-04-28 MED ORDER — GABAPENTIN 300 MG PO CAPS: 300.0000 mg | ORAL_CAPSULE | Freq: Three times a day (TID) | ORAL | Status: DC

## 2010-04-28 NOTE — Assessment & Plan Note (Signed)
The patient's pain sounds neuropathic and is likely related to her fibromyalgia. There is no joint tenderness and I am not concerned about an intra-articular process at this time. The patient's neck is also supple and I am unable to elicit any pain with neck flexion extension or pressure on the top of the head. At this point I will increase the patient's gabapentin from 100 mg at night to 300 mg 3 times a day.

## 2010-04-28 NOTE — Assessment & Plan Note (Signed)
The patients blood pressure was within reasonable control today 122/88  and I will not make any adjustments to the patients anti-hypertensive regimen. I will continue to monitor and titrate the patients medications as needed at future visits.

## 2010-04-28 NOTE — Progress Notes (Signed)
Subjective:    Patient ID: [name], female    DOB: 08-Jul-1950, 60 y.o.   MRN: [id number]  HPI   This is a 60 year old female with a past medical history significant for fibromyalgia,  And type 2 diabetes , who presents for one-month followup after having seen Dr. Coralie Common in January for upper respiratory infection. The patient had previously been prescribed a Z-Pak , which improved her symptoms somewhat. The patient was then started on 5 days of Bactrim with albuterol for wheezing and a chest x-ray was performed which was normal.  The patient reports that between these 2 episodes she had resolution of her symptoms. Since completing her second course of antibiotics she has noted significant improvement. The patient continues to have a mild cough though this is not of significant concern her at this point in time. Also in the interim, the patient has seen an ENT and had a cauterization of the vessel in her anterior naris,  Due to nose bleeding. The patient has had no recurrence of these symptoms either. The patient continues to use saline nasal spray at this time.   The patient's main concern at this time appears to be her diffuse pain syndrome. Patient has a diagnosis of fibromyalgia and history of lumbar back pain. The patient continues to have 6/10 back pain worse with movement and located in the lower thoracic/upper lumbar spine. The patient denies any lotion new weakness or numbness as well as a loss of control of her bowels or bladder.   The patient also denies any recent fevers or chills. At this time, it appears that this pain is not significantly different than her chronic pain she doesn't have any significant loss of motion.   The patient also complains of right shoulder pain , that is going on for about the last month. Patient states his that  Does feel similar to her fibromyalgia pain in the past and it comes and goes. The pain is described as shooting also a 6/10 in intensity. The patient  denies any swelling of the joint or loss of range of motion. She also denies any pain in her neck.   With regards to her diabetes, the patient's CBGs have been ranging around 150 she did not bring her meter today. She checks her feet has noted no skin breakdown.    Review of Systems  Constitutional: Negative for fever and chills.  Respiratory: Negative for cough and shortness of breath.   Cardiovascular: Negative for chest pain.        The patient has chronic occasional palpitations, but has noted no difference in the frequency or intensity of these  Gastrointestinal: Negative for vomiting, diarrhea and constipation.       Objective:   Physical Exam  Constitutional: She appears well-developed and well-nourished.  HENT:  Head: Normocephalic and atraumatic.  Eyes: Pupils are equal, round, and reactive to light.  Cardiovascular: Normal rate and intact distal pulses.  Exam reveals no gallop and no friction rub.   No murmur heard.      Irregular rhythem  Pulmonary/Chest: Effort normal and breath sounds normal. She has no wheezes. She has no rales.  Abdominal: Soft. Bowel sounds are normal. She exhibits no distension. There is no tenderness.  Musculoskeletal: Normal range of motion.        There is paraspinal muscle tenderness along both the thoracic and upper lumbar spine. There is no point tenderness over the spinous processes,  And there is full range of motion.  The patient has no effusion or tenderness along the joint line of the right shoulder, there is full range of motion of the shoulder and I am unable to elicit the patient's pain.  Neurological: She is alert. No cranial nerve deficit.  Skin: No rash noted.       Current outpatient prescriptions:albuterol (VENTOLIN HFA) 108 (90 BASE) MCG/ACT inhaler, Inhale 2 puffs into the lungs every 6 (six) hours as needed.  , Disp: , Rfl: ;  amitriptyline (ELAVIL) 50 MG tablet, Take 1 tablet (50 mg total) by mouth at bedtime., Disp: 30 tablet,  Rfl: 6;  calcium carbonate (OS-CAL) 600 MG TABS, Take 600 mg by mouth 2 (two) times daily with meals. This should contain vitamin d 400mg . , Disp: , Rfl:  cyclobenzaprine (FLEXERIL) 5 MG tablet, Take 5 mg by mouth 3 (three) times daily as needed.  , Disp: , Rfl: ;  digoxin (LANOXIN) 0.25 MG tablet, Take 250 mcg by mouth daily.  , Disp: , Rfl: ;  DILTIAZEM HCL CR PO, Take 180 mg by mouth daily.  , Disp: , Rfl: ;  fenofibrate (TRICOR) 145 MG tablet, Take 145 mg by mouth daily.  , Disp: , Rfl: ;  fish oil-omega-3 fatty acids 1000 MG capsule, Take 2 g by mouth 2 (two) times daily.  , Disp: , Rfl:  FLUoxetine (PROZAC) 20 MG capsule, Take 1 capsule (20 mg total) by mouth daily., Disp: 30 capsule, Rfl: 11;  gabapentin (NEURONTIN) 300 MG capsule, Take 1 capsule (300 mg total) by mouth 3 (three) times daily., Disp: 90 capsule, Rfl: 6;  glipiZIDE (GLUCOTROL) 10 MG tablet, Take 10 mg by mouth 2 (two) times daily before a meal.  , Disp: , Rfl:  HYDROcodone-acetaminophen (VICODIN) 5-500 MG per tablet, Take 1 tablet by mouth every 12 (twelve) hours.  , Disp: , Rfl: ;  lisinopril (PRINIVIL,ZESTRIL) 10 MG tablet, Take 10 mg by mouth daily.  , Disp: , Rfl: ;  metFORMIN (GLUCOPHAGE) 1000 MG tablet, Take 1,000 mg by mouth 2 (two) times daily with meals.  , Disp: , Rfl: ;  Multiple Vitamin (MULTIVITAMIN) capsule, Take 1 capsule by mouth daily.  , Disp: , Rfl:  niacin (NIASPAN) 500 MG CR tablet, Take 1,000 mg by mouth 2 (two) times daily.  , Disp: , Rfl: ;  nystatin (MYCOSTATIN) 100000 UNIT/ML suspension, Take 500,000 Units by mouth 4 (four) times daily.  , Disp: , Rfl: ;  omeprazole (PRILOSEC) 20 MG capsule, Take 20 mg by mouth daily.  , Disp: , Rfl: ;  potassium chloride (KLOR-CON) 10 MEQ CR tablet, Take 10 mEq by mouth daily.  , Disp: , Rfl:  DISCONTD: amitriptyline (ELAVIL) 50 MG tablet, Take 50 mg by mouth at bedtime.  , Disp: , Rfl: ;  DISCONTD: FLUoxetine (PROZAC) 20 MG capsule, Take 20 mg by mouth daily.  , Disp: , Rfl: ;   DISCONTD: gabapentin (NEURONTIN) 100 MG tablet, Take 100 mg by mouth at bedtime.  , Disp: , Rfl: ;  DISCONTD: gabapentin (NEURONTIN) 300 MG capsule, Take 300 mg by mouth 3 (three) times daily.  , Disp: , Rfl:  atenolol (TENORMIN) 25 MG tablet, Take 1 tablet (25 mg total) by mouth 2 (two) times daily., Disp: 60 tablet, Rfl: 11;  furosemide (LASIX) 40 MG tablet, Take 1 tablet (40 mg total) by mouth daily., Disp: 30 tablet, Rfl: 11;  Levothyroxine Sodium 75 MCG CAPS, Take 1 capsule (75 mcg total) by mouth daily., Disp: 30 capsule, Rfl: 11;  pravastatin (  PRAVACHOL) 80 MG tablet, Take 1 tablet (80 mg total) by mouth daily., Disp: 30 tablet, Rfl: 11 warfarin (COUMADIN) 5 MG tablet, Take by mouth as directed.  , Disp: , Rfl: ;  DISCONTD: atenolol (TENORMIN) 25 MG tablet, Take 1 tablet (25 mg total) by mouth 2 (two) times daily., Disp: 60 tablet, Rfl: 11;  DISCONTD: furosemide (LASIX) 40 MG tablet, Take 1 tablet (40 mg total) by mouth daily., Disp: 30 tablet, Rfl: 11;  DISCONTD: Levothyroxine Sodium 75 MCG CAPS, Take 1 capsule (75 mcg total) by mouth daily., Disp: 30 capsule, Rfl: 3 DISCONTD: pravastatin (PRAVACHOL) 80 MG tablet, Take 1 tablet (80 mg total) by mouth daily., Disp: 30 tablet, Rfl: 11   Past Medical History  Diagnosis Date  . Dysphagia   . Depression   . Chest pain   . Obesity   . Skin neoplasm   . Bunion   . Carotid stenosis   . Fibromyalgia   . Internal hemorrhoid   . GERD (gastroesophageal reflux disease)   . Chronic gastritis   . Transaminase or LDH elevation   . Mild cognitive impairment   . Hyperlipidemia   . TIA (transient ischemic attack)   . Fatty liver   . Lumbar back pain   . Diabetic peripheral neuropathy   . Diverticulosis   . Esophageal stricture   . Atrial fibrillation   . Osteoarthritis   . Hypertension   hx    Assessment & Plan:

## 2010-04-28 NOTE — Assessment & Plan Note (Signed)
Extensive conversation today about the importance of continued weight loss and increasing her exercise tolerance. She currently has not been exercising at all this is an initial start working out again now that her foot surgery as been completed.

## 2010-04-28 NOTE — Assessment & Plan Note (Addendum)
The patient is currently on Coumadin and has been following in the Coumadin clinic. The patient is also on diltiazem and digoxin as well as atenolol. The patient's heart rate was 67 on recheck today. Will not change in the patient's medications at this time but will continue to follow.

## 2010-04-28 NOTE — Patient Instructions (Addendum)
Please follow with your primary doctor in 2 months or as soon as you get  back from your trip. I am going to increase one of your pain medications today (neurontin). Please call us of you are having any problems.

## 2010-04-28 NOTE — Assessment & Plan Note (Signed)
The patient is due for pneumococcal vaccine today her last was in July of 07. The patient is up-to-date on her ophthalmology exam and had one performed in July of last year. I did request his records today. The patient also says that she's had a tetanus vaccination within the last 5 years.

## 2010-04-28 NOTE — Assessment & Plan Note (Signed)
The patients GERD is under good control at this time and the patient is taking their medications regularly.  The patient was encouraged to continue to avoid triggers, avoid laying flat within two hours of a meal.  Will continue to monitor for continued control at future visits.   

## 2010-04-28 NOTE — Assessment & Plan Note (Signed)
This appears to be under pretty good control at this time, patient's random CBGs have been around 150.  The patient's last A1c was 7.4 back in December. Continue patient on her current medicine regimen and recommend followup within the next 2 months if possible with her PCP.

## 2010-04-28 NOTE — Assessment & Plan Note (Signed)
The patient continues to have chronic lumbar back pain which is on physical exam, the patient only has paraspinal muscle tenderness with no tenderness over the spinous processes. I will continue Flexeril and v when necessary Vicodin for this at this time, recommended that the patient try warm compresses at night as well as to try to get more exercise to loosen her muscles.

## 2010-04-28 NOTE — Assessment & Plan Note (Signed)
This has been stable, for the last several years, and the patient's TSH was 1.247 in September of last year. I would consider rechecking a TSH at her next visit but the patient currently denies any symptoms of hypothyroidism

## 2010-05-08 ENCOUNTER — Encounter (INDEPENDENT_AMBULATORY_CARE_PROVIDER_SITE_OTHER): Payer: Medicare Other

## 2010-05-08 ENCOUNTER — Encounter: Payer: Self-pay | Admitting: Internal Medicine

## 2010-05-08 DIAGNOSIS — I4891 Unspecified atrial fibrillation: Secondary | ICD-10-CM

## 2010-05-08 DIAGNOSIS — Z7901 Long term (current) use of anticoagulants: Secondary | ICD-10-CM

## 2010-05-15 NOTE — Medication Information (Signed)
Summary: rov/eac  Anticoagulant Therapy  Managed by: Cloyde Reams, RN, BSN Referring MD: Rollene Rotunda MD PCP: Melida Quitter MD Supervising MD: Johney Frame MD, Fayrene Fearing Indication 1: Atrial Fibrillation (ICD-427.31) Lab Used: [facility] Site: [facility] INR POC 2.5 INR RANGE 2-3  Dietary changes: no    Health status changes: no    Bleeding/hemorrhagic complications: no    Recent/future hospitalizations: no    Any changes in medication regimen? yes       Details: Increased Gabapentin to 300mg  tid  Recent/future dental: no  Any missed doses?: no       Is patient compliant with meds? yes       Allergies: 1)  ! Gemfibrozil (Gemfibrozil) 2)  ! Penicillin 3)  ! Codeine 4)  ! * Latex  Anticoagulation Management History:      The patient is taking warfarin and comes in today for a routine follow up visit.  Positive risk factors for bleeding include history of CVA/TIA and presence of serious comorbidities.  Negative risk factors for bleeding include an age less than 77 years old.  The bleeding index is 'intermediate risk'.  Positive CHADS2 values include History of HTN, History of Diabetes, and Prior Stroke/CVA/TIA.  Negative CHADS2 values include Age > 36 years old.  The start date was 07/22/2004.  Her last INR was 1.6.  Anticoagulation responsible provider: Dorothye Berni MD, Fayrene Fearing.  INR POC: 2.5.  Cuvette Lot#: [id number].  Exp: 03/2011.    Anticoagulation Management Assessment/Plan:      The patient's current anticoagulation dose is Warfarin sodium 5 mg tabs: Take as directed..  The target INR is 2 - 3.  The next INR is due 06/05/2010.  Anticoagulation instructions were given to patient.  Results were reviewed/authorized by Cloyde Reams, RN, BSN.  She was notified by Cloyde Reams RN.         Prior Anticoagulation Instructions: INR 3.8  Do NOT take coumadin today.  Then return to normal dosing schedule of 1/2 tablet on Tuesday and Saturday and 1 tablet all other days.  Return to clinic in  3 weeks.   Current Anticoagulation Instructions: INR 2.5  Continue on same dosage 1 tablet daily except 1/2 tablet on Tuesdays and Saturdays.  Recheck in 4 weeks.

## 2010-05-19 LAB — GLUCOSE, CAPILLARY: Glucose-Capillary: 145 mg/dL — ABNORMAL HIGH (ref 70–99)

## 2010-05-22 ENCOUNTER — Other Ambulatory Visit: Payer: Self-pay | Admitting: *Deleted

## 2010-05-22 DIAGNOSIS — E119 Type 2 diabetes mellitus without complications: Secondary | ICD-10-CM

## 2010-05-22 DIAGNOSIS — B370 Candidal stomatitis: Secondary | ICD-10-CM

## 2010-05-22 DIAGNOSIS — I4891 Unspecified atrial fibrillation: Secondary | ICD-10-CM

## 2010-05-22 DIAGNOSIS — I10 Essential (primary) hypertension: Secondary | ICD-10-CM

## 2010-05-22 DIAGNOSIS — K219 Gastro-esophageal reflux disease without esophagitis: Secondary | ICD-10-CM

## 2010-05-22 LAB — GLUCOSE, CAPILLARY
Glucose-Capillary: 147 mg/dL — ABNORMAL HIGH (ref 70–99)
Glucose-Capillary: 184 mg/dL — ABNORMAL HIGH (ref 70–99)
Glucose-Capillary: 185 mg/dL — ABNORMAL HIGH (ref 70–99)

## 2010-05-22 NOTE — Telephone Encounter (Signed)
Call from pt who is out of town.  Said that Gabapentin 100 mg had been changed to Gabapentin 300 mg 3 times a day.  Said that the increase is making her feel bad.  Would like to go back to the original if possible.   Pt also said that she need a refill on her Fluconazole 150 mg needs it sent to [facility] at [phone].  Pt said that she has yeast in her mouth.

## 2010-05-25 MED ORDER — ALBUTEROL SULFATE HFA 108 (90 BASE) MCG/ACT IN AERS: 2.0000 | INHALATION_SPRAY | Freq: Four times a day (QID) | RESPIRATORY_TRACT | Status: DC | PRN

## 2010-05-25 MED ORDER — POTASSIUM CHLORIDE 10 MEQ PO TBCR: 10.0000 meq | EXTENDED_RELEASE_TABLET | Freq: Every day | ORAL | Status: DC

## 2010-05-25 MED ORDER — DIGOXIN 250 MCG PO TABS: 250.0000 ug | ORAL_TABLET | Freq: Every day | ORAL | Status: DC

## 2010-05-25 MED ORDER — GABAPENTIN 100 MG PO CAPS: 100.0000 mg | ORAL_CAPSULE | Freq: Every day | ORAL | Status: DC

## 2010-05-25 MED ORDER — FLUCONAZOLE 150 MG PO TABS: 150.0000 mg | ORAL_TABLET | Freq: Once | ORAL | Status: AC

## 2010-05-25 MED ORDER — NYSTATIN 100000 UNIT/ML MT SUSP: 500000.0000 [IU] | Freq: Four times a day (QID) | OROMUCOSAL | Status: DC

## 2010-05-25 MED ORDER — DILTIAZEM HCL ER 180 MG PO CP24: 180.0000 mg | ORAL_CAPSULE | Freq: Every day | ORAL | Status: DC

## 2010-05-25 MED ORDER — METFORMIN HCL 1000 MG PO TABS: 1000.0000 mg | ORAL_TABLET | Freq: Two times a day (BID) | ORAL | Status: DC

## 2010-05-25 MED ORDER — LISINOPRIL 10 MG PO TABS: 10.0000 mg | ORAL_TABLET | Freq: Every day | ORAL | Status: DC

## 2010-05-25 MED ORDER — OMEPRAZOLE 20 MG PO CPDR: 20.0000 mg | DELAYED_RELEASE_CAPSULE | Freq: Every day | ORAL | Status: DC

## 2010-05-25 MED ORDER — GLIPIZIDE 10 MG PO TABS: 10.0000 mg | ORAL_TABLET | Freq: Two times a day (BID) | ORAL | Status: DC

## 2010-05-25 NOTE — Telephone Encounter (Signed)
Changed gabapentin back to original dose Approved fluconazole Please call in all prescriptions to pharmacy

## 2010-05-30 LAB — GLUCOSE, CAPILLARY: Glucose-Capillary: 217 mg/dL — ABNORMAL HIGH (ref 70–99)

## 2010-06-03 ENCOUNTER — Ambulatory Visit: Payer: Medicare Other | Admitting: Internal Medicine

## 2010-06-03 ENCOUNTER — Encounter: Payer: Self-pay | Admitting: Internal Medicine

## 2010-06-03 ENCOUNTER — Ambulatory Visit (INDEPENDENT_AMBULATORY_CARE_PROVIDER_SITE_OTHER): Payer: Medicare Other | Admitting: Internal Medicine

## 2010-06-03 DIAGNOSIS — E119 Type 2 diabetes mellitus without complications: Secondary | ICD-10-CM

## 2010-06-03 DIAGNOSIS — J449 Chronic obstructive pulmonary disease, unspecified: Secondary | ICD-10-CM

## 2010-06-03 DIAGNOSIS — F329 Major depressive disorder, single episode, unspecified: Secondary | ICD-10-CM

## 2010-06-03 DIAGNOSIS — I10 Essential (primary) hypertension: Secondary | ICD-10-CM

## 2010-06-03 DIAGNOSIS — E039 Hypothyroidism, unspecified: Secondary | ICD-10-CM

## 2010-06-03 DIAGNOSIS — E1149 Type 2 diabetes mellitus with other diabetic neurological complication: Secondary | ICD-10-CM

## 2010-06-03 DIAGNOSIS — F3289 Other specified depressive episodes: Secondary | ICD-10-CM

## 2010-06-03 DIAGNOSIS — R1310 Dysphagia, unspecified: Secondary | ICD-10-CM

## 2010-06-03 DIAGNOSIS — E781 Pure hyperglyceridemia: Secondary | ICD-10-CM

## 2010-06-03 DIAGNOSIS — J4489 Other specified chronic obstructive pulmonary disease: Secondary | ICD-10-CM

## 2010-06-03 DIAGNOSIS — I4891 Unspecified atrial fibrillation: Secondary | ICD-10-CM

## 2010-06-03 DIAGNOSIS — E785 Hyperlipidemia, unspecified: Secondary | ICD-10-CM

## 2010-06-03 DIAGNOSIS — K219 Gastro-esophageal reflux disease without esophagitis: Secondary | ICD-10-CM

## 2010-06-03 LAB — POCT GLYCOSYLATED HEMOGLOBIN (HGB A1C): Hemoglobin A1C: 7

## 2010-06-03 LAB — GLUCOSE, CAPILLARY: Glucose-Capillary: 83 mg/dL (ref 70–99)

## 2010-06-03 MED ORDER — FUROSEMIDE 40 MG PO TABS: 40.0000 mg | ORAL_TABLET | Freq: Every day | ORAL | Status: DC

## 2010-06-03 MED ORDER — POTASSIUM CHLORIDE 10 MEQ PO TBCR: 10.0000 meq | EXTENDED_RELEASE_TABLET | Freq: Every day | ORAL | Status: DC

## 2010-06-03 MED ORDER — ATENOLOL 25 MG PO TABS: 25.0000 mg | ORAL_TABLET | Freq: Two times a day (BID) | ORAL | Status: DC

## 2010-06-03 MED ORDER — DIGOXIN 250 MCG PO TABS: 250.0000 ug | ORAL_TABLET | Freq: Every day | ORAL | Status: DC

## 2010-06-03 MED ORDER — LEVOTHYROXINE SODIUM 75 MCG PO CAPS: 75.0000 ug | ORAL_CAPSULE | Freq: Every day | ORAL | Status: DC

## 2010-06-03 MED ORDER — DILTIAZEM HCL ER 180 MG PO CP24: 180.0000 mg | ORAL_CAPSULE | Freq: Every day | ORAL | Status: DC

## 2010-06-03 MED ORDER — GLIPIZIDE 10 MG PO TABS: 10.0000 mg | ORAL_TABLET | Freq: Two times a day (BID) | ORAL | Status: DC

## 2010-06-03 MED ORDER — OMEPRAZOLE 20 MG PO CPDR: 20.0000 mg | DELAYED_RELEASE_CAPSULE | Freq: Every day | ORAL | Status: DC

## 2010-06-03 MED ORDER — WARFARIN SODIUM 5 MG PO TABS: 5.0000 mg | ORAL_TABLET | ORAL | Status: DC

## 2010-06-03 MED ORDER — AMITRIPTYLINE HCL 50 MG PO TABS: 50.0000 mg | ORAL_TABLET | Freq: Every day | ORAL | Status: DC

## 2010-06-03 MED ORDER — GABAPENTIN 100 MG PO CAPS: 100.0000 mg | ORAL_CAPSULE | Freq: Three times a day (TID) | ORAL | Status: DC

## 2010-06-03 MED ORDER — ALBUTEROL SULFATE HFA 108 (90 BASE) MCG/ACT IN AERS: 2.0000 | INHALATION_SPRAY | Freq: Four times a day (QID) | RESPIRATORY_TRACT | Status: DC | PRN

## 2010-06-03 MED ORDER — METFORMIN HCL 1000 MG PO TABS: 1000.0000 mg | ORAL_TABLET | Freq: Two times a day (BID) | ORAL | Status: DC

## 2010-06-03 MED ORDER — FENOFIBRATE 145 MG PO TABS: 145.0000 mg | ORAL_TABLET | Freq: Every day | ORAL | Status: DC

## 2010-06-03 MED ORDER — LISINOPRIL 10 MG PO TABS: 10.0000 mg | ORAL_TABLET | Freq: Every day | ORAL | Status: DC

## 2010-06-03 MED ORDER — FLUOXETINE HCL 20 MG PO CAPS: 20.0000 mg | ORAL_CAPSULE | Freq: Every day | ORAL | Status: DC

## 2010-06-03 MED ORDER — NIACIN ER (ANTIHYPERLIPIDEMIC) 500 MG PO TBCR: 1000.0000 mg | EXTENDED_RELEASE_TABLET | Freq: Two times a day (BID) | ORAL | Status: DC

## 2010-06-03 MED ORDER — PRAVASTATIN SODIUM 80 MG PO TABS: 80.0000 mg | ORAL_TABLET | Freq: Every day | ORAL | Status: DC

## 2010-06-03 NOTE — Progress Notes (Signed)
  Subjective:    Patient ID: [name], female    DOB: 15-Oct-1950, 60 y.o.   MRN: [id number]  HPI Here for acute onset upper respiratory symptoms-malaise, cough productive of yellowish sputum, and mild sinus pressure over right frontal/maxillary region over the last 3-4 days No fevers, DOE, SOB, pleuritic sx's. No hemoptysis. No numbness, tingling. Says she tried to go up on the neurontin dose (to 300 mg tid) but that made her "feel weird and spaced out"-resolved off the medicine.    Review of Systems As above    Objective:   Physical Exam Apprears well, NAD, A and O x 3 Heent-no erythema Neck no lAD Lungs-few rhonchi, otherwise clear    Assessment & Plan:   URI-"Chest Cold" OTC cough syrup, sugar free Rest, acetominophin No need for Abx  Adverse drug reaction, on higher dose neurontin Reduce to 100 mg tid  .Marland KitchenMarland KitchenHas very extensive list of medications-might benefit from polypharmacy review    For ease of refills in the meantime extended to 90 day supply.

## 2010-06-05 ENCOUNTER — Ambulatory Visit (INDEPENDENT_AMBULATORY_CARE_PROVIDER_SITE_OTHER): Payer: Medicare Other | Admitting: *Deleted

## 2010-06-05 ENCOUNTER — Telehealth: Payer: Self-pay | Admitting: *Deleted

## 2010-06-05 DIAGNOSIS — G459 Transient cerebral ischemic attack, unspecified: Secondary | ICD-10-CM

## 2010-06-05 DIAGNOSIS — I4891 Unspecified atrial fibrillation: Secondary | ICD-10-CM

## 2010-06-05 DIAGNOSIS — Z7901 Long term (current) use of anticoagulants: Secondary | ICD-10-CM

## 2010-06-05 LAB — POCT INR: INR: 1.9

## 2010-06-05 NOTE — Patient Instructions (Signed)
INR 1.9  Take 1 1/2 tablets today then resume same dose of 1 tablet every day except 1/2 tablet on Tuesday and Saturday.  Recheck INR in 3 weeks.

## 2010-06-05 NOTE — Telephone Encounter (Signed)
Pt walked in to Clinics.  Stating that she has been coughing all night.  No fevers or chills.  Coughing up yellow to darker phlegm.  [place] Spaniel that it is worse in the morning.  Saw the doctor on Tuesday for the cough.  Has not gotten any better.  Wants something for the cough.

## 2010-06-09 ENCOUNTER — Other Ambulatory Visit (INDEPENDENT_AMBULATORY_CARE_PROVIDER_SITE_OTHER): Payer: Medicare Other | Admitting: *Deleted

## 2010-06-09 DIAGNOSIS — R05 Cough: Secondary | ICD-10-CM

## 2010-06-09 DIAGNOSIS — R059 Cough, unspecified: Secondary | ICD-10-CM

## 2010-06-10 MED ORDER — BENZONATATE 100 MG PO CAPS: 100.0000 mg | ORAL_CAPSULE | Freq: Four times a day (QID) | ORAL | Status: DC | PRN

## 2010-06-10 NOTE — Telephone Encounter (Signed)
Was script called in or patient advised? Let me know today so I can close out note thanks Venita Sheffield

## 2010-06-10 NOTE — Telephone Encounter (Signed)
Tessalon approved for patient for cough

## 2010-06-12 LAB — GLUCOSE, CAPILLARY: Glucose-Capillary: 224 mg/dL — ABNORMAL HIGH (ref 70–99)

## 2010-06-12 NOTE — Telephone Encounter (Signed)
Prescription was called in.  Pt has since requested another refill of the Cough medication.

## 2010-06-13 LAB — GLUCOSE, CAPILLARY: Glucose-Capillary: 172 mg/dL — ABNORMAL HIGH (ref 70–99)

## 2010-06-14 LAB — GLUCOSE, CAPILLARY
Glucose-Capillary: 111 mg/dL — ABNORMAL HIGH (ref 70–99)
Glucose-Capillary: 113 mg/dL — ABNORMAL HIGH (ref 70–99)

## 2010-06-16 LAB — GLUCOSE, CAPILLARY: Glucose-Capillary: 145 mg/dL — ABNORMAL HIGH (ref 70–99)

## 2010-06-18 LAB — GLUCOSE, CAPILLARY: Glucose-Capillary: 179 mg/dL — ABNORMAL HIGH (ref 70–99)

## 2010-06-23 LAB — GLUCOSE, CAPILLARY: Glucose-Capillary: 136 mg/dL — ABNORMAL HIGH (ref 70–99)

## 2010-06-26 ENCOUNTER — Ambulatory Visit (INDEPENDENT_AMBULATORY_CARE_PROVIDER_SITE_OTHER): Payer: Medicare Other | Admitting: *Deleted

## 2010-06-26 DIAGNOSIS — G459 Transient cerebral ischemic attack, unspecified: Secondary | ICD-10-CM

## 2010-06-26 DIAGNOSIS — I4891 Unspecified atrial fibrillation: Secondary | ICD-10-CM

## 2010-06-26 LAB — POCT INR: INR: 2.5

## 2010-07-10 ENCOUNTER — Encounter: Payer: Medicare Other | Admitting: *Deleted

## 2010-07-10 ENCOUNTER — Encounter: Payer: Medicare Other | Admitting: Internal Medicine

## 2010-07-22 NOTE — Op Note (Signed)
[name], [name] NO.:  [id number]   MEDICAL RECORD NO.:  [id number]          PATIENT TYPE:  AMB   LOCATION:  [facility]                          FACILITY:  [facility]   PHYSICIAN:  Angelia Mould. Derrell Lolling, M.D.DATE OF BIRTH:  [birth year]   DATE OF PROCEDURE:  11/10/2006  DATE OF DISCHARGE:                               OPERATIVE REPORT   Following the incision in the circumareolar area medially, dissection  was carried down to the breast tissue.  At about 3 o'clock position, I  encountered a lactiferous duct which contained dark blood.  I intubated  this with a lacrimal duct probe and it went to centrally posteriorly.  Using electrocautery, I then dissected the widely around this duct.  I  marked the duct with a silk suture.  I took the dissection as far  posteriorly as I could and took out a large portion of breast tissue and  so getting all of the involved tissue.  This was marked and sent to the  lab for routine histology.  Hemostasis was excellent and achieved with  electrocautery.  The wound was irrigated with saline.  The deeper  tissues were closed with interrupted sutures of 3-0 Vicryl.  Skin was  closed with interrupted and subcuticular sutures of 4-0 Monocryl and  Steri-Strips.  Clean bandages were placed and the patient taken to  recovery room in stable condition.  Estimated blood loss was about 10-15  mL.   COMPLICATIONS:  None.  Sponge, needle and instrument counts were  correct.   PLEASE ADD THIS TO THE PRIOR DICTATION< WHICH WAS INTERRUIPTED PRIOR TO  COMPLETION.      Angelia Mould. Derrell Lolling, M.D.  Electronically Signed     HMI/MEDQ  D:  11/10/2006  T:  11/10/2006  Job:  [id number]

## 2010-07-22 NOTE — Assessment & Plan Note (Signed)
[facility]                            CARDIOLOGY OFFICE NOTE   NAME:[name], [name]                       MRN:          [id number]  DATE:10/14/2007                            DOB:          [birth year]    PRIMARY CARE PHYSICIAN:  [facility].   REASON FOR PRESENTATION:  Evaluate the patient with chest pain.   HISTORY OF PRESENT ILLNESS:  The patient presented on October 05, 2007, was  seen by our PA.  She had chest discomfort.  This was at rest.  It was  unprovoked.  She did have a stress perfusion study and is here to get  these results.  She is happy to see that this demonstrated an EF of 68%.  There were no evidence of ischemia or scar.  Since that time, she says  she is no longer having this chest discomfort.  She was having some  palpitations as well.  She was under quite a bit of stress.  She says  that she is no longer having these.  She is having no presyncope or  syncope.  She has had no shortness of breath.  She is active, but does  not do any walking.  She tolerates medications as listed above.  This  was the time for her yearly followup as well.   PAST MEDICAL HISTORY:  Permanent atrial fibrillation with a well-  controlled ventricular rate, normal coronary arteries in the past, mild  mitral regurgitation, left ventricle hypertrophy with some question of  diastolic dysfunction, esophageal stricture, gastroesophageal reflux  disease, type 2 diabetes mellitus, obstructive sleep apnea,  hypothyroidism, obesity, depression/anxiety, degenerative joint disease,  fibromyalgia, irritable bowel syndrome, sarcoid, and newly diagnosed  left carotid stenosis 40-60%.   ALLERGIES:  PENICILLIN and CODEINE.   MEDICATIONS:  1. Lexapro 10 mg daily.  2. Diltiazem 180 mg daily.  3. Coumadin.  4. Lasix 40 mg daily.  5. Levothyroxine 75 mcg daily.  6. Digoxin 0.25 mg daily.  7. Calcium.  8. Multivitamin.  9. Metformin 1000 mg b.i.d.  10.Atenolol 25 mg b.i.d.  11.Glipizide 10 mg b.i.d.  12.Klor-Con.  13.Amitriptyline.  14.Fish oil.  15.Omeprazole 20 mg daily.  16.Lisinopril 10 mg daily.   REVIEW OF SYSTEMS:  As stated in the HPI and otherwise negative for  other systems.   PHYSICAL EXAMINATION:  GENERAL:  The patient is in no distress.  VITAL SIGNS:  Blood pressure 129/77, heart rate 89 and regular.  HEENT:  Eyes are unremarkable; pupils are equal, round, and reactive to  light; fundi not visualized; oral mucosa unremarkable.  NECK:  No jugular venous distention at 45 degrees; carotid upstroke  brisk and symmetric; no bruits, no thyromegaly.  LUNGS:  Clear to auscultation bilaterally.  CHEST:  Unremarkable.  HEART:  PMI not displaced or sustained, S1 and S2 within normal limits;  no S3, no murmurs.  ABDOMEN:  Obese; positive bowel sounds; normal frequency and pitch; no  bruits, rebound, guarding or midline pulsatile mass; no organomegaly.  SKIN:  No rashes, no nodules.  EXTREMITIES:  Pulses 2+; no edema.  ASSESSMENT AND PLAN:  1. Chest discomfort.  The patient's chest comfort seems to have      resolved.  She had negative stress perfusion study.  No further      cardiovascular testing is suggested.  2. Carotid artery stenosis.  The patient was recently found to have      some moderate carotid stenosis.  She had 40-60% right internal      carotid artery stenosis.  This is being followed by her primary      care doctor.  3. Atrial fibrillation.  She tolerates Coumadin.  She has reasonable      rate control.  No further cardiovascular testing is suggested.  4. Hypertension.  Blood pressure is controlled.  She will continue      with the medications as listed.  5. Diabetes per primary MD.  6. Risk reduction.  I discussed with the patient the need to exercise      given her known peripheral vascular disease now and her multiple      risk factors.  7. Followup.  We will see the patient again in 1  year.      Rollene Rotunda, MD, [facility]  Electronically Signed    JH/MedQ  DD: 10/14/2007  DT: 10/15/2007  Job #: [id number]

## 2010-07-22 NOTE — Assessment & Plan Note (Signed)
[facility]                            CARDIOLOGY OFFICE NOTE   NAME:Kimberly, [name]                       MRN:          [id number]  DATE:10/05/2007                            DOB:          [birth year]    PRIMARY CARDIOLOGIST:  Rollene Rotunda, MD, [facility]   [name] is a 60 year old white female, patient of Dr. Antoine Poche, who  has a history of chronic atrial fibrillation, normal coronary arteries  on cath in 2004.  She comes in today complaining of A 2-week history of  chest pain.  She has had 3 episodes, all at rest, described as a  heaviness in her abdominal and chest region.  This is associated with  shortness of breath.  No radiation, diaphoresis, nausea, vomiting, or  dizziness.  The sensation lasts about 10 minutes.  She does say her  heart rate increases during this, but believes the chest pain comes  first.  She has no exertional chest pain.  She says when she vacuums,  her heart rate will increase and she has to take her time doing this,  but denies any chest pain with activity.  She has not increased her  caffeine or changed her diet.  She says she has been under quite a bit  of stress at home lately and is wondering if this is playing a role.  She also has gained weight and knows she needs to lose, but has not been  exercising.  Cardiac risk factors are significant for father who had  CABG in his 58s.  She is a diabetic; has hyperlipidemia and cannot take  lipid-lowering agents.  She does not have hypertension and is a  nonsmoker.   CURRENT MEDICATIONS:  1. Lexapro 10 mg daily.  2. Diltiazem 180 mg daily.  3. Coumadin as directed.  4. Lasix 40 mg daily.  5. Levothyroxine 75 mcg daily.  6. Digoxin 0.25 mg daily.  7. Calcium plus D 600 mg b.i.d.  8. Multivitamin daily.  9. Metformin 1000 mg b.i.d.  10.Atenolol 25 mg b.i.d.  11.Glipizide 10 mg b.i.d.  12.Klor-Con 10 mEq b.i.d.  13.Amitriptyline 50 mg every night.  14.Fish oil daily.  15.Omeprazole 20 mg daily.  16.Lisinopril 10 mg daily.   PHYSICAL EXAMINATION:  GENERAL:  This is a pleasant 60 year old white  female in no acute distress.  VITAL SIGNS:  Blood pressure 120/74, pulse 85, weight 215.  NECK:  Without JVD, HJR, bruit, or thyroid enlargement.  LUNGS:  Clear anterior, posterior, and lateral.  HEART:  Irregular rate and rhythm at 85 beats per minute.  Normal S1 and  S2.  No murmur, rub, bruit, thrill, or heave noted.  ABDOMEN:  Obese.  Normoactive bowel sounds are heard throughout.  EXTREMITIES:  Without cyanosis, clubbing, or edema.  She has good distal  pulses.   EKG, atrial fibrillation.  No acute change.   IMPRESSION:  1. Chest pain at rest, question etiology, rule out ischemia with      multiple cardiac risk factors.  2. History of normal coronary arteries on cath in 2004.  3. Chronic atrial fibrillation.  4. Coumadin therapy.  5. Diabetes mellitus.  6. Family history of coronary artery disease.  7. Gastroesophageal reflux disease.  8. Hyperlipidemia, unable to take statins.  9. Fibromyalgia.  10.Anxiety and depression with increased stress at home.  11.Osteoarthritis.  12.History of pulmonary nodule.  13.Hypothyroidism, treated.   PLAN:  We will order a stress test on [name] to rule out ischemia,  although I think her chest pain may be related to her atrial  fibrillation with increased ventricular rate and/or stress.  If her  stress test is normal, we may want to put a Holter on her to see what  her heart rate is doing at home.  She already has a followup appointment  with Dr. Antoine Poche in 2 weeks which we will keep.      Jacolyn Reedy, PA-C  Electronically Signed      Madolyn Frieze. Jens Som, MD, [facility]  Electronically Signed   ML/MedQ  DD: 10/05/2007  DT: 10/06/2007  Job #: [id number]

## 2010-07-22 NOTE — Assessment & Plan Note (Signed)
[facility]                            CARDIOLOGY OFFICE NOTE   NAME:[name], [name]                       MRN:          [id number]  DATE:10/28/2006                            DOB:          14-Oct-1950    PRIMARY CARE PHYSICIAN:  Peggye Pitt, M.D., [facility]   REASON FOR PRESENTATION:  Evaluate patient with atrial fibrillation.   HISTORY OF PRESENT ILLNESS:  Patient is a pleasant, 60 year old with  atrial fibrillation.  We are managing this with rate control and  anticoagulation.  She feels palpitations infrequently, but has had no  presyncope or syncope.  She cannot bring these on.  They are not  limiting.  She does not have any new shortness of breath.  She denies  any chest discomfort, neck discomfort, arm discomfort, activity-induced  nausea or vomiting, or excessive diaphoresis.  She has had problems with  a bleeding nipple and has had resection.  She has had an intraductal  papilloma.  She has had to come off Coumadin for this in the last year.  She is facing another surgery, apparently for the same thing, and  perhaps a node dissection.   PAST MEDICAL HISTORY:  Permanent atrial fibrillation with a well-  controlled ventricular rate, normal coronary arteries, mild mitral  regurgitation, left ventricular hypertrophy with some question of  diastolic dysfunction, esophageal stricture, gastroesophageal reflux  disease, type 2 diabetes mellitus, obstructive sleep apnea,  hypothyroidism, obesity, depression/anxiety, degenerative joint disease,  fibromyalgia, irritable bowel syndrome, sarcoid.   ALLERGIES:  PENICILLIN and CODEINE.   MEDICATIONS:  1. Metformin 1000 mg b.i.d.  2. Glipizide 10 mg b.i.d.  3. Alprazolam.  4. Diltiazem 180 mg daily.  5. Synthroid 75 micrograms daily.  6. Tramadol.  7. Coumadin.  8. Amitriptyline.  9. Lexapro 10 mg daily.  10.Atenolol 25 mg daily.  11.Actonel.  12.Omeprazole.  13.Darvocet.  14.Calcium.  15.Multivitamin.  16.Zocor 20 mg daily.  17.Lantus insulin.   REVIEW OF SYSTEMS:  As stated in the HPI, and otherwise negative for  other systems.   PHYSICAL EXAMINATION:  The patient is in no acute distress.  Blood  pressure 134/73, heart rate 94 and irregular.  Weight 215 pounds, body  mass index 38.  HEENT:  Eyelids unremarkable.  Pupils equal, round and reactive to  light.  Fundi not visualized.  Oral mucosa unremarkable.  NECK:  No jugular venous distention at 45 degrees.  Carotid upstroke  brisk and symmetric.  No bruits, no thyromegaly.  LYMPHATICS:  No adenopathy.  LUNGS:  Clear to auscultation bilaterally.  BACK:  No costovertebral angle tenderness.  CHEST:  Unremarkable.  HEART:  PMI not displaced or sustained.  S1 and S2 within normal limits.  No S3, no S4, no clicks, no rubs, no murmurs.  ABDOMEN:  Obese, positive bowel sounds, normal in frequency and pitch.  No bruits, no rebound, no guarding, no midline pulsatile mass, no  hepatomegaly, no splenomegaly.  SKIN:  No rashes, no nodules.  EXTREMITIES:  Two-plus pulses, no edema.   EKG:  Atrial fibrillation, rate 90s, leftward axis deviation, ST  changes  consistent with Digoxin.   ASSESSMENT AND PLAN:  1. Atrial fibrillation:  Patient has a reasonable rate by previous      monitoring.  She is only noticing rare palpitations.  If she is      noted to have a higher rate in the future, we could increase her      beta blocker.  She is safe to come off her Coumadin as needed for      upcoming surgery.  I told her she could stop this five days before.      When she resumes it, per the surgeon, she is going to call our      Coumadin clinic so that she can get appropriate followup.  2. Preoperative clearance:  Patient is at acceptable risk for the      planned procedure, based on the ACC/AHA guidelines.  No further      cardiovascular testing is suggested.  3. Followup:  I will see patient back in  one year or sooner, if she      has any problems.  We would be happy to see her at the time of her      surgery if there are any issues.     Rollene Rotunda, MD, [facility]  Electronically Signed    JH/MedQ  DD: 10/28/2006  DT: 10/29/2006  Job #: [id number]   cc:   Peggye Pitt, M.D.  Angelia Mould. Derrell Lolling, M.D.

## 2010-07-22 NOTE — Op Note (Signed)
NAMEMELANE, WINDHOLZ NO.:  [id number]   MEDICAL RECORD NO.:  [id number]          PATIENT TYPE:  AMB   LOCATION:  DSC                          FACILITY:  [facility]   PHYSICIAN:  Angelia Mould. Derrell Lolling, M.D.DATE OF BIRTH:  June 19, 1950   DATE OF PROCEDURE:  11/10/2006  DATE OF DISCHARGE:                               OPERATIVE REPORT   PREOPERATIVE DIAGNOSIS:  Bloody discharge right nipple, recurrent,  suspect papilloma.   POSTOPERATIVE DIAGNOSIS:  Bloody discharge right nipple, recurrent,  suspect papilloma.   OPERATION PERFORMED:  Excision central ductal system right breast, right  breast biopsy.   SURGEON:  Angelia Mould. Derrell Lolling, M.D.   OPERATIVE INDICATIONS:  This is a 60 year old white female who underwent  a excision of a duct system of the right breast by Dr. Francina Ames in  January 2008.  He describes excising an area at the 10:30 position.  Final pathology showed benign breast parenchyma, dilated duct and  sinuses, and no malignancy.  She states in April of this year.  She was  still bleeding from the nipple; and he could not see where it was coming  from them.  Follow up was arranged.  Dr. Maple Hudson has retired; and she saw  me in the office on October 13, 2006.  I could reproduce a dark bloody  discharge from the right nipple by massaging the breast medially, but  could not exactly see which duct it was coming from.  She has had  mammograms and an MRI.  The mammograms did not show anything.  The MRI  shows a 10-mm rim enhancing mass in the right breast at the 6:30  position, 1 cm from the nipple.  No other abnormalities were noted.  She  is brought to the operating room for re-exploration of the subareolar  area because of bloody discharge.   OPERATIVE TECHNIQUE:  Following the induction of general endotracheal  anesthesia the patient's right breast was prepped and draped in a  sterile fashion.  Then 0.5% Marcaine with epinephrine was used as a  local infiltration  anesthetic.  I marked an incision in the  circumareolar area beginning at about the 2 o'clock position and  extending around to about the 6 o'clock position.  The incision was  made; and dissection carried down into the breast tissue and into the  subareolar area.  I found the duct which contained dark blood in it, at  about 3 o'clock position; and I intubated this with a lacrimal duct  probe.   Dictation ended at this point.      Angelia Mould. Derrell Lolling, M.D.  Electronically Signed     HMI/MEDQ  D:  11/10/2006  T:  11/10/2006  Job:  [id number]

## 2010-07-25 NOTE — Assessment & Plan Note (Signed)
[facility]                               PULMONARY OFFICE NOTE   NAME:[name], [name]                       MRN:          [id number]  DATE:11/04/2005                            DOB:          06-Jun-1950    [name] returns today in followup.  Has underlying history of sarcoidosis  with right upper lobe apical scar and mediastinal adenopathy.  Observed ACE  level was elevated at 79.  Pulmonary functions have already been obtained,  and were reviewed and revealed essentially normal spirometry except for  small degree of peripheral airflow obstruction.  FEF 25, 75, 80% predicted.  Total lung capacity normal with 100% predicted.  Diffusion capacity normal  with 120% predicted.  Patient is not on inhaled medications at this time.  Patient's medication profile is obtained and reviewed. The only change is  that she is on 180 mg a day of diltiazem.   PHYSICAL EXAMINATION:  VITAL SIGNS:  Temperature 98, blood pressure 108/70,  pulse 81, saturation 98% room air.  CHEST:  Showed diminished breath sounds with prolonged expiratory phase.  No  wheeze or rhonchi noted.  CARDIAC EXAM:  Showed a regular rate and rhythm without S3.  Normal S1, S2.  ABDOMEN:  Was soft, protuberant, nontender.  EXTREMITIES:  Showed no edema or clubbing.  SKIN:  Was clear.  NEUROLOGIC EXAM:  Was intact.  HEENT EXAM:  Showed no jugular venous distention.  No lymphadenopathy.  Oropharynx clear.  Neck supple.   IMPRESSION:  Sarcoidosis with mild mediastinal adenopathy and right upper  lobe changes.  Mild elevation in ACE level.   PLAN:  Maintain off systemic steroids at this time, and we will follow this  patient expectantly.  No further pulmonary workup or treatment is indicated.                                   Charlcie Cradle Delford Field, MD, [facility]   PEW/MedQ  DD:  11/04/2005  DT:  11/05/2005  Job #:  [id number]   cc:   [facility]

## 2010-07-25 NOTE — Discharge Summary (Signed)
NAMECHARLANN, [name] NO.:  [id number]   MEDICAL RECORD NO.:  [id number]          PATIENT TYPE:  INP   LOCATION:  6730                         FACILITY:  [facility]   PHYSICIAN:  Ronda Fairly, M.D.    DATE OF BIRTH:  1950/05/14   DATE OF ADMISSION:  09/28/2005  DATE OF DISCHARGE:  10/08/2005                                 DISCHARGE SUMMARY   DISCHARGE DIAGNOSES:  1.  Hyperglycemia due to poorly controlled type 2 diabetes mellitus.  2.  Atrial fibrillation.  3.  Hypotension.  4.  Pulmonary nodule.  5.  Hypothyroidism.  6.  History of hypotension.  7.  Gastroesophageal reflux disease.  8.  Hyperlipidemia.  9.  Fibromyalgia.  10. Anxiety/depression.  11. Osteoarthritis.  12. History of cholecystectomy in 1990.  13. History of hiatal hernia repair in 1990.  14. History of esophageal stricture with dilatation in 2007.  15. History of hysterectomy/bilateral salpingo-oophorectomy.   DISCHARGE MEDICATIONS:  1.  Metformin 1000 mg p.o. b.i.d. starting on October 11, 2005.  Prescription      was provided.  2.  Glipizide 10 mg p.o. b.i.d.  Prescription was provided.  3.  Alprazolam 0.5 mg by mouth as needed.  4.  Diltiazem CD 180 mg oral once daily.  5.  Synthroid 75 mcg oral once daily.  6.  Tramadol HCL 50 mg oral 1 tab 3 times daily.  7.  Coumadin 7.5 mg oral until appointment with Dr. Antoine Poche.  8.  Amitriptyline 25 mg oral.  Take at bedtime each day.  9.  Lexapro 10 mg oral once daily.  10. Atenolol 25 mg oral twice daily.  11. Actonel 5 mg oral once daily.  12. Omeprazole 20 mg oral once daily.  13. Darvocet 100/650 mg oral 1 to 2 tabs daily.  14. Calcium with vitamin D 600 mg oral twice daily.  15. Multivitamin oral once daily.  16. Zocor 20 mg by mouth once daily.  17. Lantus insulin 25 units subcutaneously daily, then decrease to 15 units      on October 13, 2005, and gradually decrease by 5 units every night until      no longer taking.   PROCEDURES  PERFORMED:  CT angiography of the chest was performed on October 02, 2005 for shortness of breath, which showed mild mediastinal and hilar  adenopathy and a right-upper lobe, 13 mm patchy, ill-defined, density  suspicious for sarcoidosis.  No PE was seen.  Three different chest x-rays  showed mild interstitial prominence, but did not correlated with the density  in the right upper lobe.  Due to her dyspnea on exertion and atrial  fibrillation, adenosine Myoview was performed on October 07, 2005, which  demonstrated no myocardial ischemia and a left ventricular ejection fraction  of 57%.   CONSULTATIONS:  [place] [facility] was consulted.  Dr. Olga Millers was  the attending physician of record.   ADMITTING HISTORY AND PHYSICAL:  [name] is an obese 60 year old Caucasian  woman with a past medical history of diabetes mellitus type 2, atrial  fibrillation, hypotension, hypothyroidism, anxiety, and  GERD, who presented  with a 2-week history of chest tightness, blurry vision, nausea,  hypotension, and bradycardia.  She was seen by her cardiologist on September 25, 2005 and lab work, which was returned the following day, revealed a blood  glucose of 592.  She went the emergency department the following day and was  started on metformin.  She was followed up in the clinic on September 28, 2005  and was found to be profoundly orthostatic and bradycardic.  She was  admitted to the stepdown unit that day.   Admission vitals revealed temperature 97.1, pulse 55, blood pressure 88/60.  Orthostatic blood pressures were 108/58 lying, 86/54 sitting, and 82/52  standing.  Capillary blood glucose was 393.  Chemistries revealed sodium at  131, potassium 5.1, chloride 92, bicarb 27, BUN 11, creatinine 0.9.  Her CK  was 131, CK-MB 13.6, but troponin was 0.02.  A digoxin level was measured at  1.6.  PT was 21 and INR was 1.7.   PHYSICAL EXAMINATION:  HEART:  Remarkable for an irregularly regular heart  beat.    The remainder of the physical exam was benign.   HOSPITAL COURSE:  1.  Shortness of breath/chest pain.  [name] was initially worked up for      myocardial infarction.  Initial cardiac enzymes showed elevated CK-MB      fractions, but normal troponin I.  Her final set of enzymes were      negative.  She was placed on telemetry throughout her hospitalizations.      Initial EKG showed atrial fibrillation and ST and T-wave abnormalities      likely due to digitalis.  TSH was measured as normal.  2-demensional      echo showed ejection fraction at 55% to 65%.  She rule out for      myocardial infarction.  Throughout her hospitalization, she continued to      have episodic chest tightness.  This chest pain gradually improved.  She      has had no episodes for 2 days at the time of discharge.  Workup for      other causes such as carcinoid syndrome was negative.  5-H1AA level was      measured at 2600 per 24-hour period, which is within normal range.      Additionally, she was worked up for pheochromocytoma as she had been      complaining of episodic chest pain.  24-hour catecholamines were      measured in her urine.  These results are pending at the time of      discharge.  2.  Atrial fibrillation.  This is a long-standing problem that she has been      followed with outpatient cardiology for.  At the time of admission, she      was noted to be bradycardic and her rate medications were discontinued.      During her admission, she was seen by cardiology in consultation.  They      recommended an outpatient adenosine Myoview for evaluation.  Her heart      rate was controlled on atenolol.  On October 03, 2005, she began having      dyspnea on exertion when ambulating short distances.  During this time      her heart rate remained within normal limits.  However, on October 05, 2005, her heart rate became consistently tachycardiac to the 120s at  baseline and up to the 170s when ambulating.   Diltiazem was restarted by      the primary team and cardiology added back her digoxin when they saw her      in consultation that afternoon.  Her heart rate has remained in the 60      to 80 range since that time.  At the time of discharge, the patient was      asked to walk along the hallway in the hospital.  Her initial oxygen      saturation was 97% on room air and her heart rate was 68 before walking.      After walking, her oxygen saturation was 96% and her heart rate was 78.      She is to be followed by her outpatient cardiologist, Dr. Antoine Poche for      further management of her atrial fibrillation.  She is discharged on 7.5      mg of Coumadin.  3.  Diabetes mellitus.  Initially on presentation, [name] complained of      blurred vision, polyuria, and polydipsia as well as nausea.  Her      hemoglobin A1c was measured at 11.5.  Her capillary blood glucose was      elevated on admission and remained elevated throughout her hospital      stay.  She initially continued on metformin and glipizide was added      during this hospitalization.  Later, it was increased to 10 mg b.i.d.      Capillary blood glucose remained in the mid 200s to 300s.  Metformin was      discontinued on October 05, 2005 and she was started on 10 units Lantus      insulin each night.  Because her blood glucose remained elevated the      following day, her Lantus dose was increased.  This pattern continued      over the next several days with increases of Lantus insulin.  At the      time of discharge, she was taking 25 units of Lantus insulin each night      in an effort to control her blood glucose.  Her polyuria, polydipsia,      and blurred vision have improved.  Her capillary blood glucose remains      in the upper 100s and lower 200s at the time of discharge.  This will be      managed by her outpatient physician.  She has been instructed to      continue taking the Lantus insulin 25 units per night.  She  will add      metformin 1000 mg b.i.d. starting on October 11, 2005.  On October 13, 2005, she is to decrease her Lantus to 15 units nightly and then      decrease by 5 units every night until she is no longer taking the      Lantus.  4.  Pulmonary nodule.  Workup for her chest tightness included CT angiogram,      which incidentally revealed hilar and mediastinal adenopathy and a small      density in the right upper lobe.  This was concerning for sarcoidosis      and serum ACE levels were obtained and noted to be elevated.  Dr.      Antoine Poche, her primary cardiologist, was apprised of this.  He will      follow as  an outpatient.  Additionally, she was referred to outpatient      pulmonology for workup of this nodule as well as to obtain pulmonary      function tests.  5.  Hyperlipidemia.  During this admission, fasting lipids were obtained and     noted to be elevated.  She was started on simvastatin on October 05, 2005.      She is to continue this as an outpatient.  She is taking 20 mg daily.  6.  Hypothyroidism.  This problem has remained stable throughout      hospitalization.  She is continuing on Synthroid 75 mcg at this time.  7.  Fibromyalgia/anxiety.  This problem has remained stable throughout      hospitalization as well.  She is to continue on Flexeril and Lexapro.   DISCHARGE LABORATORY DATE AND VITALS:  VITAL SIGNS:  Temperature 97.2, pulse  78, blood pressure 120/77, respirations 20, oxygen saturation 96% on room  air.   LABS:  Sodium 137, potassium 3.9, chloride 104, bicarb 28, BUN 10,  creatinine 0.6, glucose 220, calcium 9.3.  White blood cell count 7,  hemoglobin 15.5, hematocrit 44.3, platelets 198,000.  PT 22.7, INR 1.9.     ______________________________  Carlynn Herald    ______________________________  Ronda Fairly, M.D.    PG/MEDQ  D:  10/08/2005  T:  10/09/2005  Job:  [id number]   cc:   Shan Levans, M.D. [facility]  Rollene Rotunda, M.D.  Rufina Falco,  M.D.

## 2010-07-25 NOTE — Cardiovascular Report (Signed)
   NAME:  [name], [name]                          ACCOUNT NO.:  [id number]   MEDICAL RECORD NO.:  [id number]                   PATIENT TYPE:  INP   LOCATION:  3738                                 FACILITY:  [facility]   PHYSICIAN:  Rollene Rotunda, M.D.                DATE OF BIRTH:  [birth year]   DATE OF PROCEDURE:  07/03/2002  DATE OF DISCHARGE:                              CARDIAC CATHETERIZATION   PROCEDURE:  Left heart catheterization/coronary arteriography.   INDICATIONS:  Evaluate the patient with chest pain suggestive of unstable  angina.   PROCEDURAL NOTE:  Left heart catheterization was performed via the right  femoral artery.  The artery was cannulated using a SmartNeedle.  A 6-French  arterial sheath was inserted via the modified Seldinger technique.  A  preformed Judkins and a pigtail catheter were utilized.  The patient  tolerated the procedure well and left the lab in stable condition.   RESULTS:   HEMODYNAMICS:  LV of 117/9, AO 118/74.   CORONARIES:  The left main was normal.   The LAD was normal.   There was a large first diagonal which was normal.   The circumflex was small in the AV groove.   There was a moderate-sized mid obtuse marginal which was normal.   The right coronary artery was a large dominant vessel and was normal.   LEFT VENTRICULOGRAM:  The left ventriculogram was obtained in the RAO  projection.  The EF was 60% with normal wall motion.   CONCLUSION:  Normal coronary arteries.  Well-preserved left ventricular  function.   PLAN:  No further cardiac workup is suggested.  The patient should continue  to have aggressive management of metabolic syndrome.  Further evaluation of  non-anginal chest pain will be per her primary caregivers.                                               Rollene Rotunda, M.D.    JH/MEDQ  D:  07/03/2002  T:  07/04/2002  Job:  [id number]

## 2010-07-25 NOTE — H&P (Signed)
NAME:  [name], [name] NO.:  [id number]   MEDICAL RECORD NO.:  [id number]                   PATIENT TYPE:  INP   LOCATION:  3738                                 FACILITY:  [facility]   PHYSICIAN:  Rollene Rotunda, M.D.                DATE OF BIRTH:  [birth year]   DATE OF ADMISSION:  06/29/2002  DATE OF DISCHARGE:                                HISTORY & PHYSICAL   PRIMARY CARE PHYSICIAN:  [facility].   REASON FOR ADMISSION:  This is a patient with chest pain.   HISTORY OF PRESENT ILLNESS:  The patient is a 60 year old white female with  a history of atrial fibrillation x10 years; this has been managed with rate  control.  She reports that she was hospitalized here perhaps 1-2 years ago.  She is not sure of the workup, and I am not clear of the indications.  She  was seen by Dr. Aleen Campi, she recalls.  I am awaiting these old records.   She reports that she developed chest tightness last night that has been  constant.  This discomfort has been occurring off and on for the last 2-3  months; however, it has not been this severe.  It is across her entire mid  chest.  It is 9/10 in intensity at it worst.  There is no radiation to her  neck or to her arms.  It is heavy.  It is not like her previous reflux or  other symptoms, which she has had evaluated in the past.  It has not been on  exertion, although she is relatively sedentary.  The most exerting thing she  does is vacuum; this does not bring on this chest discomfort.  She does get  dyspnea with exertion, which has been a chronic problem, progressive over  six months.  She denies any PND, although she says she awakens herself not  breathing.  She does snore.  She is not describing orthopnea.  She has had  no associated nausea or vomiting with this.  She presented to [facility].  She reported to me no improvement with nitrates.  There was  apparently some improvement with morphine.   There were no dynamic EKG  changes.   PAST MEDICAL HISTORY:  1. Permanent atrial fibrillation.  2. Diabetes mellitus x8 months, diet controlled.  3. Questionable hyperlipidemia.  4. Depression.  5. Irritable bowel syndrome.  6. Fibromyalgia.  7. Hypothyroidism.  8. Gastroesophageal reflux disease.  9. Hiatal hernia.  10.      Osteoarthritis.  11.      Esophageal stricture, status post dilatation.   PAST SURGICAL HISTORY:  1. Hiatal hernia surgery x2.  2. Cholecystectomy.  3. Partial hysterectomy.  4. Skin cancer resected.  5. Left inguinal hernia repair.   ALLERGIES:  CODEINE AND PENICILLIN.   MEDICATIONS:  1. Atenolol 50 mg daily.  2.  Diltiazem 240 mg daily.  3. Synthroid 0.075 mg daily.  4. Lexapro 10 mg daily.  5. Protonix 40 mg daily.  6. Aspirin 325 mg daily.  7. Darvocet-N 100 p.o. p.r.n.   SOCIAL HISTORY:  The patient has never smoked cigarettes.  She was a Warehouse manager x18 years.  She is filing for disability because of pain and  depression.   FAMILY HISTORY:  Contributory for her father having bypass in his late 86s  or early 71s.  Her mother died of complications of rheumatoid arthritis.   REVIEW OF SYSTEMS:  As stated in the HPI.  Positive for diaphoresis  frequently, heat intolerance, chronic fatigue, diffuse joint and muscle  pains; otherwise, as stated in the HPI.  Negative for other systems.   PHYSICAL EXAMINATION:  GENERAL:  The patient is in no distress.  VITAL SIGNS:  Blood pressure 106/68, pulse 83 and regular.  HEENT:  Eyes are unremarkable.  Pupils are equal, round, and reactive to  light.  Fundi not visualized.  Oral mucosa unremarkable.  NECK:  No jugular venous distention.  Wave form within normal limits.  Carotid upstroke brisk and symmetric.  No bruits, thyromegaly.  LYMPHATICS:  No cervical, axillary, or inguinal adenopathy.  LUNGS:  Clear to auscultation bilaterally.  BACK:  No costovertebral angle tenderness.  CHEST:  Unremarkable.   HEART:  PMI not displaced or sustained.  S1 and S2 within normal limits.  No  S3, S4.  No murmurs.  ABDOMEN:  Obese.  Positive bowel sounds; normal in frequency and pitch.  No  bruits, rebound, guarding.  No midline pulsatile mass, hepatomegaly,  splenomegaly.  SKIN:  No rash, nonpruritic.  EXTREMITIES:  There are 2+ pulses throughout.  No edema.  No cyanosis or  clubbing.  NEUROLOGICAL:  Oriented to person, place, and time.  Cranial nerves II-XII  grossly intact.  Motor grossly intact.   LABORATORY DATA:  EKG:  Atrial fibrillation, leftward axis, intervals within  normal limits, poor anterior R-wave progression, no acute ST or T-wave  changes.   Chest x-ray normal, per [facility].   WBC 7.8, hemoglobin 13.9, platelets 272.  INR 0.9.  Sodium 138, potassium  3.9, chloride 108, BUN 14, creatinine 1.0.  CK 127, MB 4.4, Troponin 0.01.   ASSESSMENT/PLAN:  1. Chest discomfort.  The patient's chest discomfort has atypical more than     typical features.  It has been constant.  There has been no enzyme or     electrocardiographic change despite this.  There was no relief with     nitrates.  At this point, she will be treated with Lovenox, aspirin, and     beta-blockers.  We will continue to rule out myocardial infarction.  If     her enzymes are negative, I think the most prudent way to evaluate this     is with a stress test.  She would not be able to exercise; therefore, she     will have an adenosine/Cardiolite.  2. Shortness of breath.  We will check her brain natriuretic peptide level.     She will have oxygen saturation recorded.  If these are normal, she could     be referred for an outpatient sleep study, as she describes snoring.     This would be per her primary caregiver.  3. Diabetes.  We will check a hemoglobin A1c and Accu-Cheks.  She currently     is diet controlled.  4. Risk reduction.  We  will check a lipid profile and treat accordingly. 5. Permanent atrial  fibrillation.  She apparently was hospitalized here.     There may have been an echocardiogram as recently as a year ago; this     should be reviewed to make sure she has no indications on this     echocardiogram for Coumadin.  For now, she will be maintained with rate     control and aspirin.                                               Rollene Rotunda, M.D.    JH/MEDQ  D:  06/29/2002  T:  06/30/2002  Job:  [id number]

## 2010-07-25 NOTE — Consult Note (Signed)
NAMEBRISTOL, OSENTOSKI NO.:  [id number]   MEDICAL RECORD NO.:  [id number]          PATIENT TYPE:  INP   LOCATION:  6730                         FACILITY:  [facility]   PHYSICIAN:  Olga Millers, M.D. LHCDATE OF BIRTH:  10-02-1950   DATE OF CONSULTATION:  10/01/2005  DATE OF DISCHARGE:                                   CONSULTATION   PRIMARY CARE PHYSICIAN:  [facility]   PRIMARY CARDIOLOGIST:  Rollene Rotunda, M.D.   CHIEF COMPLAINT:  Chest pain.   HISTORY OF PRESENT ILLNESS:  [name] is a 60 year old female with a  history of atrial fibrillation and chest pain as well as a clean cath in  2004.  She came to the hospital on September 28, 2005 for dizziness and blurred  vision and weakness.  [name] also has occasional chest pain which she  describes as a substernal tightness or heaviness.  She had symptoms in 2004  when she was cathed, at which time her cors were without disease and her EF  was 60%.  Since then she has been relatively symptom free until  approximately 2 months ago when she began, again, getting chest pain  episodes.  They occur with and without exertion.  Each episode lasts 10 to  15 minutes.  The pain is moderate in intensity.  It does not change with  position change or cough.  The symptoms are associated with shortness of  breath and diaphoresis.  She remembers them as being similar to her pre-cath  pain.  She is currently symptom free.   PAST MEDICAL HISTORY:  She is status post cardiac catheterization in 2004  with normal cors and an EF of 60%.  She has a history of diabetes and  hyperlipidemia with hypertriglyceridemia.  She also has a history of obesity  and a family history of coronary artery disease in the father.  She has a  history of permanent atrial fibrillation greater than 10 years and is on  rate control medications.  She has gastroesophageal reflux disease symptoms,  hypothyroidism, and was taking Coumadin prior to  admission.  She also had a  history of fibromyalgia, degenerative joint disease, osteoarthritis,  depression, and irritable bowel disease.   PAST SURGICAL HISTORY:  She is status post EGD and diltation as well as  cardiac catheterization, hiatal hernia surgery x2, cholecystectomy, partial  hysterectomy, and hernia repair.   ALLERGIES:  SHE IS ALLERGIC OR INTOLERANT TO PENICILLIN, CODEINE, LATEX,  LOPID, DEMEROL, AND FLU VACCINE.   MEDICATIONS:  1.  Currently she is taking Elavil 25 mg nightly.  2.  Atenolol 25 mg daily.  3.  Lexapro 10 mg daily.  4.  Glucotrol 10 mg b.i.d.  5.  Synthroid 75 mcg daily.  6.  Glucophage 500 mg b.i.d.  7.  Protonix 40 mg daily.  8.  Heparin per pharmacy.   SOCIAL HISTORY:  She lives alone in [place] and is unemployed and attempting  to get disability.  She denies any history of alcohol, tobacco, or drug use.   FAMILY HISTORY:  Her mother died  of complications of rheumatoid arthritis  but without a history of heart disease.  Her father had bypass surgery in  his 13s, but she has no siblings with heart disease.   REVIEW OF SYSTEMS:  She has occasional episodic diaphoresis.  She still  complains of blurred vision.  The chest pain is described above.  She has  chronic dyspnea on exertion that is worse for the last 3 months.  She  describes orthopnea.  She has occasional upper extremity edema.  She has  occasional complications.  She coughs occasionally with some phlegm, but no  significant sputum production.  She has problems with weakness.  She has  problems with worsening anxiety.  She has some chronic pain issues and  arthralgias, especially in her back.  She has multiple bowel movements on a  daily basis but denies diarrhea.  She has decreased or controlled reflux  symptoms on the Protonix.  Review of systems is otherwise negative.   PHYSICAL EXAMINATION:  VITAL SIGNS:  Temperature 98.4, blood pressure  130/80, pulse 101, respiratory rate 20, O2  saturation 95% on room air.  GENERAL:  She is well-developed, obese, white female in no acute distress.  HEENT:  Her head is normocephalic and atraumatic.  Extraocular movements are  intact.  Throat clear.  Nose without discharge.  NECK:  Full and tender but without lymphadenopathy, thyromegaly, bruits, or  JVD.  CARDIOVASCULAR:  Her heart is irregular in rate and rhythm S1, S2 but no  significant murmur, rubs, or gallop is noted.  Distal pulses are 2+ in all 4  extremities and she has no femoral bruits appreciated but the femoral area  is tender to palpation.  LUNGS:  Essentially clear to auscultation bilaterally but the chest wall is  tender.  SKIN:  No rashes or lesions are noted.  ABDOMEN:  Soft and has active bowel sounds and is diffusely tender to  palpation.  EXTREMITIES:  There is no cyanosis, clubbing, or edema noted.  MUSCULOSKELETAL:  She has no joint deformity or effusions.  NEUROLOGIC:  She is alert and oriented.  Cranial nerves II-XII grossly  intact.   EKG is atrial fibrillation with nonspecific ST changes.   Chest x-ray shows mild interstitial prominence, but no edema or infiltrates.   LABORATORY DATA:  Hemoglobin 15.4, hematocrit 43.6, WBC 7.9, platelets  189,000.  Sodium 140, potassium 3.4, chloride 146, CO2 29, BUN 2, creatinine  0.7, glucose 196.  INR 1.2.  Hemoglobin A1c 11.5.  Total cholesterol 194,  triglycerides 521, HDL 28, LDL not calculated.  CK-MB 131/13.6 then  124/10.6, then 101/5.9, then 60/2.4.  Troponin I 0.02, then 0.03, then 0.04,  then 0.01.   Echocardiogram:  EF is 55% to 65%, study inadequate for regional wall motion  and bilateral atria mildly dilated.   IMPRESSION:  Chest pain:  Ms. Georgiann Mohs chest pain is very atypical and all of  her troponins were normal.  She does not need further inpatient workup but  we will risk stratify her with an outpatient Myoview after discharge and have her follow up with Dr. Antoine Poche.  She does need rate control  of the  atrial fibrillation and will increase the atenolol for this.  She can also  be restarted on her calcium blocker or digoxin as needed as long as her  heart rate is followed closely.  We can schedule a Holter monitor 1 to 2  weeks after discharge to make sure there is adequate rate control.  She is  currently on heparin and her Coumadin can be resumed with a goal INR of 2 to  3.  She has embolic risk  factors of diabetes along with the atrial fibrillation.  She will also  benefit from the statin long term, given her history of diabetes, which is  CAD equivalent.  A CT of the brain can also be considered given her history  of recent dizziness and visual disturbances.      Theodore Demark, P.A. [facility]    ______________________________  Olga Millers, M.D. [facility]    RB/MEDQ  D:  10/01/2005  T:  10/02/2005  Job:  [id number]

## 2010-08-07 ENCOUNTER — Ambulatory Visit (INDEPENDENT_AMBULATORY_CARE_PROVIDER_SITE_OTHER): Payer: Medicare Other | Admitting: *Deleted

## 2010-08-07 ENCOUNTER — Encounter: Payer: Medicare Other | Admitting: Internal Medicine

## 2010-08-07 DIAGNOSIS — I4891 Unspecified atrial fibrillation: Secondary | ICD-10-CM

## 2010-08-07 DIAGNOSIS — G459 Transient cerebral ischemic attack, unspecified: Secondary | ICD-10-CM

## 2010-08-13 ENCOUNTER — Ambulatory Visit (INDEPENDENT_AMBULATORY_CARE_PROVIDER_SITE_OTHER): Payer: Medicare Other | Admitting: Internal Medicine

## 2010-08-13 ENCOUNTER — Encounter: Payer: Self-pay | Admitting: Internal Medicine

## 2010-08-13 VITALS — BP 113/66 | HR 66 | Temp 97.4°F | Ht 63.0 in | Wt 192.1 lb

## 2010-08-13 DIAGNOSIS — M545 Low back pain, unspecified: Secondary | ICD-10-CM

## 2010-08-13 DIAGNOSIS — E119 Type 2 diabetes mellitus without complications: Secondary | ICD-10-CM

## 2010-08-13 DIAGNOSIS — E039 Hypothyroidism, unspecified: Secondary | ICD-10-CM

## 2010-08-13 DIAGNOSIS — I10 Essential (primary) hypertension: Secondary | ICD-10-CM

## 2010-08-13 DIAGNOSIS — I4891 Unspecified atrial fibrillation: Secondary | ICD-10-CM

## 2010-08-13 LAB — TSH: TSH: 0.162 u[IU]/mL — ABNORMAL LOW (ref 0.350–4.500)

## 2010-08-13 LAB — CBC
Hemoglobin: 14.9 g/dL (ref 12.0–15.0)
MCH: 31.7 pg (ref 26.0–34.0)
MCHC: 34.1 g/dL (ref 30.0–36.0)
MCV: 93 fL (ref 78.0–100.0)
Platelets: 276 10*3/uL (ref 150–400)
RBC: 4.7 MIL/uL (ref 3.87–5.11)
WBC: 9.8 10*3/uL (ref 4.0–10.5)

## 2010-08-13 LAB — POCT GLYCOSYLATED HEMOGLOBIN (HGB A1C): Hemoglobin A1C: 6.8

## 2010-08-13 LAB — GLUCOSE, CAPILLARY: Glucose-Capillary: 105 mg/dL — ABNORMAL HIGH (ref 70–99)

## 2010-08-13 MED ORDER — CYCLOBENZAPRINE HCL 5 MG PO TABS: 5.0000 mg | ORAL_TABLET | Freq: Three times a day (TID) | ORAL | Status: DC | PRN

## 2010-08-13 MED ORDER — HYDROCODONE-ACETAMINOPHEN 5-500 MG PO TABS: 1.0000 | ORAL_TABLET | Freq: Two times a day (BID) | ORAL | Status: DC

## 2010-08-13 NOTE — Assessment & Plan Note (Signed)
Patient continues to have chronic lumbar back pain which is also notable on physical exam. She mainly has paraspinal muscle tenderness with no tenderness over the spinous processes. She does not have any neurologic findings. I advised the patient to do exercise especially to strengthen the back muscle. I will continue at this point Flexeril and Vicodin when necessary. She had x-rays from her spine performed in the past. If the back pain is persistent and associated with neurologic symptoms consider to refer patient to a spine specialist for further evaluation.

## 2010-08-13 NOTE — Assessment & Plan Note (Addendum)
Hemoglobin A1c today 6.8. I will continue metformin glipizide at this point. Patient was advised to check her cultures especially in the setting when she noticed that she may have low sugars. If this is occurring on a regular basis consider to decrease glipizide dosage. Patient was advised to call the clinic for the results. Patient is due for annual eye exam. She will followup with her primary ophthalmologist.

## 2010-08-13 NOTE — Progress Notes (Signed)
  Subjective:    Patient ID: [name], female    DOB: Sep 15, 1950, 60 y.o.   MRN: [id number]  HPI This is a 60 year old female with significant past medical history who is here for regular followup. #1 diabetes patient noted that she has been taking all her medication. She mentioned that she sometimes has low blood sugars but not but does not have any recording period #2 back pain: The patient was complaining about back pain. It is more an aching pain which is present most of the day. She has this chronically. She denies any significant weakness, numbness, tingling or change in gait. She denies any fevers or chills. Denies any trauma. She is taking the hydrocortisone which is helping. #3 patient mentioned that she has been itching since 4 days. Denies any changes in medication, detergent or cream/lotion. Denies any changes in her diet. She not notice any rash.    Review of Systems  Constitutional: Negative for fever and appetite change.  HENT: Positive for neck stiffness. Negative for neck pain.   Eyes: Negative for visual disturbance.  Respiratory: Negative for chest tightness and shortness of breath.   Cardiovascular: Negative for chest pain.  Gastrointestinal: Positive for diarrhea, constipation and abdominal distention.  Genitourinary: Negative for difficulty urinating.  Musculoskeletal: Positive for back pain and arthralgias. Negative for joint swelling and gait problem.  Neurological: Negative for dizziness, weakness and numbness.       Objective:   Physical Exam  Constitutional: She is oriented to person, place, and time. She appears well-developed and well-nourished.  HENT:  Head: Normocephalic and atraumatic.  Neck: Neck supple.  Cardiovascular: Normal rate, regular rhythm and normal heart sounds.   Pulmonary/Chest: Effort normal and breath sounds normal.  Abdominal: Soft. Bowel sounds are normal. She exhibits no distension. There is no tenderness. There is no rebound and  no guarding.  Musculoskeletal: Normal range of motion.       Lumbar back: She exhibits tenderness. She exhibits normal range of motion, no swelling, no edema, no deformity and no spasm.  Neurological: She is alert and oriented to person, place, and time. She has normal reflexes. A cranial nerve deficit is present.  Skin: Skin is warm and dry. No rash noted. No erythema.          Assessment & Plan:

## 2010-08-13 NOTE — Assessment & Plan Note (Signed)
The patient is on thyroxine for several years. I would check TSH today for possible changes in dosage.

## 2010-08-13 NOTE — Assessment & Plan Note (Signed)
Patient's blood pressure is well controlled on current regimen. She is followed up by cardiologist. Would continue to monitor.

## 2010-08-14 NOTE — Progress Notes (Signed)
Addended by: Almyra Deforest on: 08/14/2010 01:35 PM   Modules accepted: Orders

## 2010-08-15 ENCOUNTER — Telehealth: Payer: Self-pay | Admitting: Internal Medicine

## 2010-08-15 DIAGNOSIS — E039 Hypothyroidism, unspecified: Secondary | ICD-10-CM

## 2010-08-15 LAB — T4, FREE: Free T4: 1.29 ng/dL (ref 0.80–1.80)

## 2010-08-15 MED ORDER — LEVOTHYROXINE SODIUM 50 MCG PO TABS: 50.0000 ug | ORAL_TABLET | Freq: Every day | ORAL | Status: DC

## 2010-08-15 NOTE — Telephone Encounter (Signed)
Patient TSH low and free T4 normal suggestive of subclinical Hyperthyroidism. Will decrease Levothyroxine dosage from 75 mcg to 50 mcg and recheck TSH in 6 weeks.

## 2010-08-28 ENCOUNTER — Ambulatory Visit (INDEPENDENT_AMBULATORY_CARE_PROVIDER_SITE_OTHER): Payer: Medicare Other | Admitting: *Deleted

## 2010-08-28 DIAGNOSIS — I4891 Unspecified atrial fibrillation: Secondary | ICD-10-CM

## 2010-08-28 DIAGNOSIS — G459 Transient cerebral ischemic attack, unspecified: Secondary | ICD-10-CM

## 2010-09-08 ENCOUNTER — Encounter: Payer: Medicare Other | Admitting: *Deleted

## 2010-09-22 ENCOUNTER — Encounter: Payer: Self-pay | Admitting: Cardiology

## 2010-10-21 ENCOUNTER — Ambulatory Visit (INDEPENDENT_AMBULATORY_CARE_PROVIDER_SITE_OTHER): Payer: Medicare Other | Admitting: Cardiology

## 2010-10-21 ENCOUNTER — Encounter: Payer: Self-pay | Admitting: Cardiology

## 2010-10-21 ENCOUNTER — Ambulatory Visit (INDEPENDENT_AMBULATORY_CARE_PROVIDER_SITE_OTHER): Payer: Medicare Other | Admitting: *Deleted

## 2010-10-21 DIAGNOSIS — I4891 Unspecified atrial fibrillation: Secondary | ICD-10-CM

## 2010-10-21 DIAGNOSIS — G459 Transient cerebral ischemic attack, unspecified: Secondary | ICD-10-CM

## 2010-10-21 DIAGNOSIS — E785 Hyperlipidemia, unspecified: Secondary | ICD-10-CM

## 2010-10-21 DIAGNOSIS — I10 Essential (primary) hypertension: Secondary | ICD-10-CM

## 2010-10-21 LAB — POCT INR: INR: 4.2

## 2010-10-21 NOTE — Progress Notes (Signed)
HPI The patient presents for followup of atrial fibrillation. Since I last saw her she has had some episodes of lightheadedness. She says it feels like seizure she had as a child. She's had some numbness in her face. Is not associated with palpitations. She said no loss of vision voice. She's had no motor deficits. She does have occasional palpitations perhaps 3-4 times per week. They might last for about 15 minutes. She's had no presyncope or syncope. After lying down he will go away. She denies any chest pressure, neck or arm discomfort. He said no new shortness of breath, PND or orthopnea.  Allergies  Allergen Reactions  . Latex Other (See Comments)    blisters  . Codeine Other (See Comments)    REACTION: Unknown reaction  . Gemfibrozil Swelling    REACTION: Angioedema  . Penicillins Rash    Current Outpatient Prescriptions  Medication Sig Dispense Refill  . albuterol (PROVENTIL HFA;VENTOLIN HFA) 108 (90 BASE) MCG/ACT inhaler Inhale 2 puffs into the lungs every 6 (six) hours as needed. For shortness of breath       . amitriptyline (ELAVIL) 50 MG tablet Take 1 tablet (50 mg total) by mouth at bedtime.  90 tablet  4  . atenolol (TENORMIN) 25 MG tablet Take 1 tablet (25 mg total) by mouth 2 (two) times daily.  180 tablet  3  . B Complex-Biotin-FA (SUPER B-50 B COMPLEX PO) Take 1 tablet by mouth 2 (two) times daily.        . benzonatate (TESSALON PERLES) 100 MG capsule Take 1 capsule (100 mg total) by mouth every 6 (six) hours as needed for cough.  30 capsule  1  . calcium carbonate (OS-CAL) 600 MG TABS Take 600 mg by mouth 2 (two) times daily with meals. This should contain vitamin d 400mg .       . Calcium Carbonate-Vitamin D (CALCIUM 600+D) 600-400 MG-UNIT per tablet Take 1 tablet by mouth 2 (two) times daily with a meal.        . cyclobenzaprine (FLEXERIL) 5 MG tablet Take 5 mg by mouth 3 (three) times daily as needed. For fibromyalgia       . digoxin (LANOXIN) 0.25 MG tablet Take 1 tablet  (250 mcg total) by mouth daily.  90 tablet  3  . diltiazem (DILACOR XR) 180 MG 24 hr capsule Take 1 capsule (180 mg total) by mouth daily.  90 capsule  3  . fenofibrate (TRICOR) 145 MG tablet Take 1 tablet (145 mg total) by mouth daily.  90 tablet  3  . fish oil-omega-3 fatty acids 1000 MG capsule Take 2 g by mouth 2 (two) times daily.       Marland Kitchen FLUoxetine (PROZAC) 20 MG capsule Take 1 capsule (20 mg total) by mouth daily.  90 capsule  3  . furosemide (LASIX) 40 MG tablet Take 1 tablet (40 mg total) by mouth daily.  90 tablet  3  . gabapentin (NEURONTIN) 100 MG capsule Take 1 capsule (100 mg total) by mouth 3 (three) times daily.  90 capsule  6  . glipiZIDE (GLUCOTROL) 10 MG tablet Take 1 tablet (10 mg total) by mouth 2 (two) times daily before a meal.  180 tablet  3  . HYDROcodone-acetaminophen (VICODIN) 5-500 MG per tablet Take 1 tablet by mouth every 12 (twelve) hours as needed. For pain        . levothyroxine (SYNTHROID, LEVOTHROID) 50 MCG tablet Take 1 tablet (50 mcg total) by mouth daily.  30  tablet  3  . levothyroxine (SYNTHROID, LEVOTHROID) 75 MCG tablet Take 75 mcg by mouth daily.        Marland Kitchen lisinopril (PRINIVIL,ZESTRIL) 10 MG tablet Take 1 tablet (10 mg total) by mouth daily.  90 tablet  3  . metFORMIN (GLUCOPHAGE) 1000 MG tablet Take 1 tablet (1,000 mg total) by mouth 2 (two) times daily with a meal.  180 tablet  3  . Multiple Vitamin (MULTIVITAMIN) capsule Take 1 capsule by mouth daily.       . niacin (NIASPAN) 500 MG CR tablet Take 2 tablets (1,000 mg total) by mouth 2 (two) times daily.  360 tablet  3  . nystatin (MYCOSTATIN) 100000 UNIT/ML suspension Take 5 mLs (500,000 Units total) by mouth 4 (four) times daily.  60 mL  1  . omeprazole (PRILOSEC) 20 MG capsule Take 20 mg by mouth as needed.        . potassium chloride (KLOR-CON) 10 MEQ CR tablet Take 1 tablet (10 mEq total) by mouth daily.  90 tablet  3  . pravastatin (PRAVACHOL) 80 MG tablet Take 1 tablet (80 mg total) by mouth daily.   90 tablet  3  . warfarin (COUMADIN) 5 MG tablet Take 5 mg by mouth daily.          Past Medical History  Diagnosis Date  . Dysphagia   . Depression   . Chest pain   . Obesity   . Skin neoplasm   . Bunion   . Carotid stenosis   . Fibromyalgia   . Internal hemorrhoid   . GERD (gastroesophageal reflux disease)   . Chronic gastritis   . Transaminase or LDH elevation   . Mild cognitive impairment   . Hyperlipidemia   . TIA (transient ischemic attack)   . Fatty liver   . Lumbar back pain   . Diabetic peripheral neuropathy   . Diverticulosis   . Esophageal stricture   . Atrial fibrillation   . Osteoarthritis   . Hypertension   . Hypothyroidism   . Diabetes mellitus   . History of hiatal hernia   . Cholecystitis     s/p cholecystectomy  . OSA (obstructive sleep apnea)   . Sarcoidosis   . Breast mass, right     benign  . Fibromyalgia     Past Surgical History  Procedure Date  . Vaginal hysterectomy   . Cholecystectomy   . Hiatal hernia repair   . Esophageal dilation   . Excision of milk duct     right breast  . Hernia surgery     femoral  . Right foot surgery   . Left foot surgery     ROS: As stated in the HPI and negative for all other systems.   PHYSICAL EXAM BP 105/70  Pulse 77  Ht 5\' 2"  (1.575 m)  Wt 194 lb (87.998 kg)  BMI 35.48 kg/m2  LMP 06/03/1978 GENERAL:  Well appearing HEENT:  Pupils equal round and reactive, fundi not visualized, oral mucosa unremarkable NECK:  No jugular venous distention, waveform within normal limits, carotid upstroke brisk and symmetric, no bruits, no thyromegaly LYMPHATICS:  No cervical, inguinal adenopathy LUNGS:  Clear to auscultation bilaterally BACK:  No CVA tenderness CHEST:  Unremarkable HEART:  PMI not displaced or sustained,S1 and S2 within normal limits, no S3, no S4, no clicks, no rubs, no murmurs, irregular ABD:  Flat, positive bowel sounds normal in frequency in pitch, no bruits, no rebound, no guarding, no  midline pulsatile  mass, no hepatomegaly, no splenomegaly EXT:  2 plus pulses throughout, no edema, no cyanosis no clubbing SKIN:  No rashes no nodules NEURO:  Cranial nerves II through XII grossly intact, motor grossly intact throughout PSYCH:  Cognitively intact, oriented to person place and time  EKG:  Atrial fibrillation, rate 60s , axis within normal limits, intervals within normal limits, no acute ischemic changes.  ASSESSMENT AND PLAN

## 2010-10-21 NOTE — Assessment & Plan Note (Signed)
She tolerates this rhythm and rate control and anticoagulation. We will continue with the meds as listed.  

## 2010-10-21 NOTE — Assessment & Plan Note (Signed)
I will have her come back for a fasting lipid profile

## 2010-10-21 NOTE — Assessment & Plan Note (Signed)
She has bilateral less than 59% stenosis and will have follow up Dopplers in October.

## 2010-10-21 NOTE — Patient Instructions (Signed)
Please return fasting for a cholesterol profile.  The current medical regimen is effective;  continue present plan and medications.  Follow up in 1 year with Dr Antoine Poche.  You will receive a letter in the mail 2 months before you are due.  Please call us when you receive this letter to schedule your follow up appointment.

## 2010-10-21 NOTE — Assessment & Plan Note (Signed)
The blood pressure is at target. No change in medications is indicated. We will continue with therapeutic lifestyle changes (TLC).  

## 2010-10-22 ENCOUNTER — Encounter (HOSPITAL_COMMUNITY): Payer: Self-pay

## 2010-10-22 ENCOUNTER — Encounter (HOSPITAL_COMMUNITY)
Admission: RE | Admit: 2010-10-22 | Discharge: 2010-10-22 | Disposition: A | Discharge status: Home / Self Care | Payer: Medicare Other | Source: Ambulatory Visit | Attending: Ophthalmology | Admitting: Ophthalmology

## 2010-10-22 HISTORY — DX: Unspecified convulsions: R56.9

## 2010-10-22 LAB — BASIC METABOLIC PANEL
BUN: 15 mg/dL (ref 6–23)
CO2: 28 mEq/L (ref 19–32)
Calcium: 10.2 mg/dL (ref 8.4–10.5)
Chloride: 99 mEq/L (ref 96–112)
Creatinine, Ser: 0.66 mg/dL (ref 0.50–1.10)
GFR calc Af Amer: >60 mL/min (ref >60)
Potassium: 3.9 mEq/L (ref 3.5–5.1)
Sodium: 138 mEq/L (ref 135–145)

## 2010-10-22 LAB — CBC
HCT: 39.5 % (ref 36.0–46.0)
Hemoglobin: 13.7 g/dL (ref 12.0–15.0)
MCV: 95.2 fL (ref 78.0–100.0)
RBC: 4.15 MIL/uL (ref 3.87–5.11)
WBC: 7.5 10*3/uL (ref 4.0–10.5)

## 2010-10-22 MED ORDER — CYCLOPENTOLATE-PHENYLEPHRINE 0.2-1 % OP SOLN
OPHTHALMIC | Status: AC
  Filled 2010-10-22: qty 2

## 2010-10-22 MED ORDER — LACTATED RINGERS IV SOLN: INTRAVENOUS | Status: DC

## 2010-10-22 NOTE — Patient Instructions (Addendum)
[name]  10/22/2010   Your procedure is scheduled on:  10/27/10  Report to [facility] at  650  AM.  Call this number if you have problems the morning of surgery: [phone]   Remember:   Do not eat food:After Midnight.  Do not drink clear liquids: After Midnight.  Take these medicines the morning of surgery with A SIP OF WATER: prozac,atenolol,lanoxin,ditaizem,neurontin.Take albuterol before you come.   Do not wear jewelry, make-up or nail polish.  Do not wear lotions, powders, or perfumes. You may wear deodorant.  Do not shave 48 hours prior to surgery.  Do not bring valuables to the hospital.  Contacts, dentures or bridgework may not be worn into surgery.  Leave suitcase in the car. After surgery it may be brought to your room.  For patients admitted to the hospital, checkout time is 11:00 AM the day of discharge.   Patients discharged the day of surgery will not be allowed to drive home.  Name and phone number of your driver: family  Special Instructions: N/A   Please read over the following fact sheets that you were given: Pain Booklet, Surgical Site Infection Prevention, Anesthesia Post-op Instructions and Care and Recovery After Surgery PATIENT INSTRUCTIONS POST-ANESTHESIA  IMMEDIATELY FOLLOWING SURGERY:  Do not drive or operate machinery for the first twenty four hours after surgery.  Do not make any important decisions for twenty four hours after surgery or while taking narcotic pain medications or sedatives.  If you develop intractable nausea and vomiting or a severe headache please notify your doctor immediately.  FOLLOW-UP:  Please make an appointment with your surgeon as instructed. You do not need to follow up with anesthesia unless specifically instructed to do so.  WOUND CARE INSTRUCTIONS (if applicable):  Keep a dry clean dressing on the anesthesia/puncture wound site if there is drainage.  Once the wound has quit draining you may leave it open to air.  Generally  you should leave the bandage intact for twenty four hours unless there is drainage.  If the epidural site drains for more than 36-48 hours please call the anesthesia department.  QUESTIONS?:  Please feel free to call your physician or the hospital operator if you have any questions, and they will be happy to assist you.     [facility] Anesthesia Department [address] [place] [phone]

## 2010-10-27 ENCOUNTER — Encounter (HOSPITAL_COMMUNITY): Payer: Self-pay | Admitting: Anesthesiology

## 2010-10-27 ENCOUNTER — Encounter (HOSPITAL_COMMUNITY): Payer: Self-pay

## 2010-10-27 ENCOUNTER — Ambulatory Visit (HOSPITAL_COMMUNITY)
Admission: RE | Admit: 2010-10-27 | Discharge: 2010-10-27 | Disposition: A | Discharge status: Home / Self Care | Payer: Medicare Other | Source: Ambulatory Visit | Attending: Ophthalmology | Admitting: Ophthalmology

## 2010-10-27 ENCOUNTER — Ambulatory Visit (HOSPITAL_COMMUNITY): Payer: Medicare Other | Admitting: Anesthesiology

## 2010-10-27 ENCOUNTER — Encounter (HOSPITAL_COMMUNITY)
Admission: RE | Disposition: A | Discharge status: Home / Self Care | Payer: Self-pay | Source: Ambulatory Visit | Attending: Ophthalmology

## 2010-10-27 DIAGNOSIS — E119 Type 2 diabetes mellitus without complications: Secondary | ICD-10-CM | POA: Insufficient documentation

## 2010-10-27 DIAGNOSIS — Z01812 Encounter for preprocedural laboratory examination: Secondary | ICD-10-CM | POA: Insufficient documentation

## 2010-10-27 DIAGNOSIS — H2510 Age-related nuclear cataract, unspecified eye: Secondary | ICD-10-CM | POA: Insufficient documentation

## 2010-10-27 DIAGNOSIS — G4733 Obstructive sleep apnea (adult) (pediatric): Secondary | ICD-10-CM | POA: Insufficient documentation

## 2010-10-27 DIAGNOSIS — Z7901 Long term (current) use of anticoagulants: Secondary | ICD-10-CM | POA: Insufficient documentation

## 2010-10-27 DIAGNOSIS — Z79899 Other long term (current) drug therapy: Secondary | ICD-10-CM | POA: Insufficient documentation

## 2010-10-27 DIAGNOSIS — I10 Essential (primary) hypertension: Secondary | ICD-10-CM | POA: Insufficient documentation

## 2010-10-27 DIAGNOSIS — E785 Hyperlipidemia, unspecified: Secondary | ICD-10-CM | POA: Insufficient documentation

## 2010-10-27 HISTORY — PX: CATARACT EXTRACTION W/PHACO: SHX586

## 2010-10-27 LAB — GLUCOSE, CAPILLARY: Glucose-Capillary: 158 mg/dL — ABNORMAL HIGH (ref 70–99)

## 2010-10-27 SURGERY — PHACOEMULSIFICATION, CATARACT, WITH IOL INSERTION
Anesthesia: Monitor Anesthesia Care | Site: Eye | Laterality: Left | Wound class: Clean

## 2010-10-27 MED ORDER — LACTATED RINGERS IV SOLN
INTRAVENOUS | Status: DC | PRN
  Administered 2010-10-27: 07:00:00 via INTRAVENOUS

## 2010-10-27 MED ORDER — TETRACAINE HCL 0.5 % OP SOLN
OPHTHALMIC | Status: AC
  Administered 2010-10-27: 1 [drp] via OPHTHALMIC
  Filled 2010-10-27: qty 2

## 2010-10-27 MED ORDER — LIDOCAINE HCL 3.5 % OP GEL
OPHTHALMIC | Status: AC
  Filled 2010-10-27: qty 5

## 2010-10-27 MED ORDER — BALANCED SALT IO SOLN
INTRAOCULAR | Status: DC | PRN
  Administered 2010-10-27: 15 mL via OPHTHALMIC

## 2010-10-27 MED ORDER — KETOROLAC TROMETHAMINE 0.5 % OP SOLN
1.0000 [drp] | OPHTHALMIC | Status: AC
  Administered 2010-10-27: 1 [drp] via OPHTHALMIC

## 2010-10-27 MED ORDER — GATIFLOXACIN 0.5 % OP SOLN
OPHTHALMIC | Status: DC | PRN
  Administered 2010-10-27: 1 [drp] via OPHTHALMIC

## 2010-10-27 MED ORDER — MIDAZOLAM HCL 2 MG/2ML IJ SOLN
INTRAMUSCULAR | Status: AC
  Administered 2010-10-27: 2 mg via INTRAVENOUS
  Filled 2010-10-27: qty 2

## 2010-10-27 MED ORDER — CYCLOPENTOLATE-PHENYLEPHRINE 0.2-1 % OP SOLN
1.0000 [drp] | Freq: Once | OPHTHALMIC | Status: AC
  Administered 2010-10-27: 1 [drp] via OPHTHALMIC

## 2010-10-27 MED ORDER — LIDOCAINE HCL 3.5 % OP GEL: 1.0000 "application " | Freq: Once | OPHTHALMIC | Status: DC

## 2010-10-27 MED ORDER — EPINEPHRINE HCL 1 MG/ML IJ SOLN
INTRAMUSCULAR | Status: AC
  Filled 2010-10-27: qty 1

## 2010-10-27 MED ORDER — TETRACAINE HCL 0.5 % OP SOLN
1.0000 [drp] | OPHTHALMIC | Status: AC
  Administered 2010-10-27: 1 [drp] via OPHTHALMIC

## 2010-10-27 MED ORDER — LACTATED RINGERS IV SOLN
INTRAVENOUS | Status: DC
  Administered 2010-10-27: 07:00:00 via INTRAVENOUS

## 2010-10-27 MED ORDER — EPINEPHRINE HCL 1 MG/ML IJ SOLN
INTRAOCULAR | Status: DC | PRN
  Administered 2010-10-27: 08:00:00

## 2010-10-27 MED ORDER — GATIFLOXACIN 0.5 % OP SOLN
1.0000 [drp] | Freq: Once | OPHTHALMIC | Status: AC
  Administered 2010-10-27: 1 [drp] via OPHTHALMIC

## 2010-10-27 MED ORDER — LIDOCAINE HCL 3.5 % OP GEL
OPHTHALMIC | Status: DC | PRN
  Administered 2010-10-27: 1 via OPHTHALMIC

## 2010-10-27 MED ORDER — NA HYALUR & NA CHOND-NA HYALUR 0.55-0.5 ML IO KIT
PACK | INTRAOCULAR | Status: DC | PRN
  Administered 2010-10-27: 1 via OPHTHALMIC

## 2010-10-27 MED ORDER — TETRACAINE HCL 0.5 % OP SOLN
OPHTHALMIC | Status: DC | PRN
  Administered 2010-10-27: 1 [drp] via OPHTHALMIC

## 2010-10-27 MED ORDER — MIDAZOLAM HCL 2 MG/2ML IJ SOLN
1.0000 mg | INTRAMUSCULAR | Status: DC | PRN
  Administered 2010-10-27: 2 mg via INTRAVENOUS

## 2010-10-27 MED ORDER — CARBACHOL 0.01 % IO SOLN
INTRAOCULAR | Status: AC
  Filled 2010-10-27: qty 1.5

## 2010-10-27 SURGICAL SUPPLY — 29 items
CAPSULAR TENSION RING-AMO (OPHTHALMIC RELATED) IMPLANT
CLOTH BEACON ORANGE TIMEOUT ST (SAFETY) ×1 IMPLANT
GLOVE BIO SURGEON STRL SZ7.5 (GLOVE) IMPLANT
GLOVE BIOGEL M 6.5 STRL (GLOVE) IMPLANT
GLOVE BIOGEL PI IND STRL 6.5 (GLOVE) IMPLANT
GLOVE BIOGEL PI IND STRL 7.0 (GLOVE) IMPLANT
GLOVE BIOGEL PI INDICATOR 6.5 (GLOVE) ×1
GLOVE BIOGEL PI INDICATOR 7.0 (GLOVE)
GLOVE ECLIPSE 6.5 STRL STRAW (GLOVE) IMPLANT
GLOVE ECLIPSE 7.5 STRL STRAW (GLOVE) IMPLANT
GLOVE EXAM NITRILE LRG STRL (GLOVE) IMPLANT
GLOVE EXAM NITRILE MD LF STRL (GLOVE) ×1 IMPLANT
GLOVE SKINSENSE NS SZ6.5 (GLOVE)
GLOVE SKINSENSE NS SZ7.0 (GLOVE)
GLOVE SKINSENSE NS SZ7.5 (GLOVE) ×1
GLOVE SKINSENSE STRL SZ6.5 (GLOVE) IMPLANT
GLOVE SKINSENSE STRL SZ7.0 (GLOVE) IMPLANT
GLOVE SKINSENSE STRL SZ7.5 (GLOVE) IMPLANT
INST SET CATARACT ~~LOC~~ (KITS) ×2 IMPLANT
KIT VITRECTOMY (OPHTHALMIC RELATED) IMPLANT
PAD ARMBOARD 7.5X6 YLW CONV (MISCELLANEOUS) ×1 IMPLANT
PROC W NO LENS (INTRAOCULAR LENS)
PROC W SPEC LENS (INTRAOCULAR LENS)
PROCESS W NO LENS (INTRAOCULAR LENS) IMPLANT
PROCESS W SPEC LENS (INTRAOCULAR LENS) IMPLANT
RING MALYGIN (MISCELLANEOUS) IMPLANT
SIGHTPATH CAT PROC W REG LENS (Ophthalmic Related) ×2 IMPLANT
VISCOELASTIC ADDITIONAL (OPHTHALMIC RELATED) IMPLANT
WATER STERILE IRR 250ML POUR (IV SOLUTION) ×1 IMPLANT

## 2010-10-27 NOTE — Transfer of Care (Signed)
Immediate Anesthesia Transfer of Care Note  Patient: [name]  Procedure(s) Performed:  CATARACT EXTRACTION PHACO AND INTRAOCULAR LENS PLACEMENT (IOC) - CDE- 1.78  Patient Location: PACU and Short Stay  Anesthesia Type: MAC  Level of Consciousness: awake, alert  and oriented  Airway & Oxygen Therapy: Patient Spontanous Breathing  Post-op Assessment: Report given to PACU RN  Post vital signs: stable  Complications: No apparent anesthesia complications

## 2010-10-27 NOTE — Brief Op Note (Signed)
10/27/2010  8:48 AM  PATIENT:  [name]  60 y.o. female  PRE-OPERATIVE DIAGNOSIS:  nuclear cataract left eye  POST-OPERATIVE DIAGNOSIS:  nuclear cataract left eye  PROCEDURE:  Procedure(s): CATARACT EXTRACTION PHACO AND INTRAOCULAR LENS PLACEMENT (IOC)  SURGEON:  Surgeon(s): Susa Simmonds   ANESTHESIA:   topical  ESTIMATED BLOOD LOSS: * No blood loss amount entered *   BLOOD ADMINISTERED: n/a    DRAINS: none   LOCAL MEDICATIONS USED:  NONE  SPECIMEN:  No Specimen  DISPOSITION OF SPECIMEN:  N/A  COUNTS:  N/a    TOURNIQUET:  * No tourniquets in log *  DICTATION #: n/a  PLAN OF CARE:  See scanned documents  PATIENT DISPOSITION:  Short Stay   10/27/2010  8:52 AM  PATIENT:  [name]  60 y.o. female  PRE-OPERATIVE DIAGNOSIS:  nuclear cataract left eye  POST-OPERATIVE DIAGNOSIS:  nuclear cataract left eye  PROCEDURE:  Procedure(s): CATARACT EXTRACTION PHACO AND INTRAOCULAR LENS PLACEMENT (IOC)  SURGEON:  Surgeon(s): Susa Simmonds  ASSISTANTS: Trenton Founds, CST  ANESTHESIA STAFF: Glynn Octave - CRNA Laurene Footman - Anesthesiologist  ANESTHESIA:   topical  REQUESTED LENS POWER: 24.5 [id number]  LENS IMPLANT INFORMATION: @ORIMPLANT @  CUMULATIVE DISSIPATED ENERGY:1.78  INDICATIONS:  See scanned document  OP FINDINGS:moderately dense ns  COMPLICATIONS:None  DICTATION #: see scanned note  PLAN OF CARE: see scanned document  PATIENT DISPOSITION:  Short Stay

## 2010-10-27 NOTE — Discharge Instructions (Signed)
See paper discharge document

## 2010-10-27 NOTE — Op Note (Signed)
See scanned note.

## 2010-10-27 NOTE — Anesthesia Preprocedure Evaluation (Addendum)
Anesthesia Evaluation  Name, MR# and DOB Patient awake  General Assessment Comment  Reviewed: Allergy & Precautions, H&P , NPO status , Patient's Chart, lab work & pertinent test results  History of Anesthesia Complications Negative for: history of anesthetic complications  Airway Mallampati: II  Neck ROM: Full    Dental  (+) Teeth Intact   Pulmonary  sleep apnea and Continuous Positive Airway Pressure Ventilation    pulmonary exam normalPulmonary Exam Normal     Cardiovascular hypertension, + dysrhythmias Atrial Fibrillation Irregular Normal    Neuro/Psych    (+) Depression,  TIA  GI/Hepatic/Renal (+)  GERD Medicated and Controlled     Endo/Other  (+) Diabetes mellitus-, Well Controlled, Type 2, Oral Hypoglycemic Agents,Hypothyroidism,      Abdominal   Musculoskeletal  (+) Fibromyalgia -  Hematology   Peds  Reproductive/Obstetrics    Anesthesia Other Findings             Anesthesia Physical Anesthesia Plan  ASA: III  Anesthesia Plan: MAC   Post-op Pain Management:    Induction: Intravenous  Airway Management Planned: Nasal Cannula  Additional Equipment:   Intra-op Plan:   Post-operative Plan:   Informed Consent: I have reviewed the patients History and Physical, chart, labs and discussed the procedure including the risks, benefits and alternatives for the proposed anesthesia with the patient or authorized representative who has indicated his/her understanding and acceptance.     Plan Discussed with:   Anesthesia Plan Comments:         Anesthesia Quick Evaluation

## 2010-10-27 NOTE — Anesthesia Postprocedure Evaluation (Signed)
  Anesthesia Post-op Note  Patient: [name]  Procedure(s) Performed:  CATARACT EXTRACTION PHACO AND INTRAOCULAR LENS PLACEMENT (IOC) - CDE- 1.78  Patient Location: PACU and Short Stay  Anesthesia Type: MAC  Level of Consciousness: awake, alert  and oriented  Airway and Oxygen Therapy: Patient Spontanous Breathing  Post-op Pain: none  Post-op Assessment: Post-op Vital signs reviewed, Patient's Cardiovascular Status Stable and Respiratory Function Stable  Post-op Vital Signs: stable  Complications: No apparent anesthesia complications

## 2010-10-27 NOTE — H&P (Signed)
  H&P updated this morning without changes.

## 2010-10-31 ENCOUNTER — Encounter (HOSPITAL_COMMUNITY): Payer: Self-pay | Admitting: Ophthalmology

## 2010-11-03 ENCOUNTER — Other Ambulatory Visit: Payer: Self-pay | Admitting: *Deleted

## 2010-11-03 DIAGNOSIS — I4891 Unspecified atrial fibrillation: Secondary | ICD-10-CM

## 2010-11-03 MED ORDER — WARFARIN SODIUM 5 MG PO TABS: 5.0000 mg | ORAL_TABLET | Freq: Every day | ORAL | Status: DC

## 2010-11-03 NOTE — Telephone Encounter (Signed)
Last refill 06/03/10

## 2010-11-04 ENCOUNTER — Encounter: Payer: Medicare Other | Admitting: *Deleted

## 2010-11-05 ENCOUNTER — Ambulatory Visit (INDEPENDENT_AMBULATORY_CARE_PROVIDER_SITE_OTHER): Payer: Medicare Other | Admitting: *Deleted

## 2010-11-05 DIAGNOSIS — G459 Transient cerebral ischemic attack, unspecified: Secondary | ICD-10-CM

## 2010-11-05 DIAGNOSIS — I4891 Unspecified atrial fibrillation: Secondary | ICD-10-CM

## 2010-11-05 DIAGNOSIS — Z7901 Long term (current) use of anticoagulants: Secondary | ICD-10-CM | POA: Insufficient documentation

## 2010-11-05 DIAGNOSIS — E039 Hypothyroidism, unspecified: Secondary | ICD-10-CM

## 2010-11-05 LAB — TSH: TSH: 2.72 u[IU]/mL (ref 0.35–5.50)

## 2010-11-05 LAB — POCT INR: INR: 3.8

## 2010-11-06 NOTE — Progress Notes (Signed)
Quick Note:  RN Preliminarily reviewed. Forwarded to MD desktop for review and signature ______ 

## 2010-11-19 ENCOUNTER — Encounter: Payer: Medicare Other | Admitting: *Deleted

## 2010-11-20 ENCOUNTER — Other Ambulatory Visit: Payer: Self-pay | Admitting: Internal Medicine

## 2010-11-20 ENCOUNTER — Ambulatory Visit (INDEPENDENT_AMBULATORY_CARE_PROVIDER_SITE_OTHER): Payer: Medicare Other | Admitting: *Deleted

## 2010-11-20 DIAGNOSIS — Z1231 Encounter for screening mammogram for malignant neoplasm of breast: Secondary | ICD-10-CM

## 2010-11-20 DIAGNOSIS — I4891 Unspecified atrial fibrillation: Secondary | ICD-10-CM

## 2010-11-20 DIAGNOSIS — G459 Transient cerebral ischemic attack, unspecified: Secondary | ICD-10-CM

## 2010-11-20 LAB — POCT INR: INR: 3.7

## 2010-11-26 ENCOUNTER — Encounter: Payer: Medicare Other | Admitting: Internal Medicine

## 2010-12-01 ENCOUNTER — Ambulatory Visit (HOSPITAL_COMMUNITY)
Admission: RE | Admit: 2010-12-01 | Discharge: 2010-12-01 | Disposition: A | Discharge status: Home / Self Care | Payer: Medicare Other | Source: Ambulatory Visit | Attending: Internal Medicine | Admitting: Internal Medicine

## 2010-12-01 DIAGNOSIS — Z1231 Encounter for screening mammogram for malignant neoplasm of breast: Secondary | ICD-10-CM

## 2010-12-04 ENCOUNTER — Ambulatory Visit (INDEPENDENT_AMBULATORY_CARE_PROVIDER_SITE_OTHER): Payer: Medicare Other | Admitting: *Deleted

## 2010-12-04 DIAGNOSIS — I4891 Unspecified atrial fibrillation: Secondary | ICD-10-CM

## 2010-12-04 DIAGNOSIS — G459 Transient cerebral ischemic attack, unspecified: Secondary | ICD-10-CM

## 2010-12-04 LAB — POCT INR: INR: 3

## 2010-12-09 LAB — DIGOXIN LEVEL: Digoxin Level: 1.3

## 2010-12-09 LAB — DIFFERENTIAL
Basophils Relative: 1
Eosinophils Absolute: 0.6
Eosinophils Relative: 7 — ABNORMAL HIGH
Lymphocytes Relative: 37
Monocytes Absolute: 0.5
Monocytes Relative: 7
Neutro Abs: 4.1
Neutrophils Relative %: 49

## 2010-12-09 LAB — CBC
HCT: 43.3
Hemoglobin: 14.8
MCHC: 34.1
MCV: 95.2
Platelets: 255
RBC: 4.55
WBC: 8.3

## 2010-12-09 LAB — URINALYSIS, ROUTINE W REFLEX MICROSCOPIC
Hgb urine dipstick: NEGATIVE
Ketones, ur: NEGATIVE
Nitrite: NEGATIVE
Protein, ur: NEGATIVE
Specific Gravity, Urine: 1.011
pH: 5.5

## 2010-12-09 LAB — BASIC METABOLIC PANEL
BUN: 10
CO2: 29
Calcium: 10
Creatinine, Ser: 0.7
GFR calc Af Amer: >60
GFR calc non Af Amer: >60
Sodium: 143

## 2010-12-09 LAB — PROTIME-INR: INR: 2.1 — ABNORMAL HIGH

## 2010-12-09 LAB — POCT CARDIAC MARKERS
CKMB, poc: 3.6
Myoglobin, poc: 69.5
Troponin i, poc: <0.05

## 2010-12-19 LAB — URINALYSIS, ROUTINE W REFLEX MICROSCOPIC
Bilirubin Urine: NEGATIVE
Glucose, UA: NEGATIVE
Hgb urine dipstick: NEGATIVE
Ketones, ur: NEGATIVE
Protein, ur: NEGATIVE
Specific Gravity, Urine: 1.014
Urobilinogen, UA: 0.2
pH: 5

## 2010-12-19 LAB — COMPREHENSIVE METABOLIC PANEL
Albumin: 3.9
Alkaline Phosphatase: 105
BUN: 9
CO2: 32
Calcium: 10.1
Chloride: 99
Creatinine, Ser: 0.58
GFR calc Af Amer: >60
GFR calc non Af Amer: >60
Potassium: 4.1
Total Bilirubin: 1.3 — ABNORMAL HIGH
Total Protein: 6.9

## 2010-12-19 LAB — CBC
HCT: 41.2
MCV: 95.4
Platelets: 298
RBC: 4.32
RDW: 12.6
WBC: 8

## 2010-12-19 LAB — DIFFERENTIAL
Basophils Absolute: 0
Basophils Relative: 1
Eosinophils Absolute: 0.6
Eosinophils Relative: 7 — ABNORMAL HIGH
Lymphocytes Relative: 39
Lymphs Abs: 3.1
Monocytes Absolute: 0.6
Monocytes Relative: 7
Neutro Abs: 3.7

## 2010-12-19 LAB — PROTIME-INR
INR: 1
Prothrombin Time: 13.1

## 2010-12-24 ENCOUNTER — Ambulatory Visit (INDEPENDENT_AMBULATORY_CARE_PROVIDER_SITE_OTHER): Payer: Medicare Other | Admitting: *Deleted

## 2010-12-24 DIAGNOSIS — G459 Transient cerebral ischemic attack, unspecified: Secondary | ICD-10-CM

## 2010-12-24 DIAGNOSIS — I4891 Unspecified atrial fibrillation: Secondary | ICD-10-CM

## 2010-12-24 DIAGNOSIS — Z7901 Long term (current) use of anticoagulants: Secondary | ICD-10-CM

## 2010-12-25 ENCOUNTER — Encounter: Payer: Medicare Other | Admitting: *Deleted

## 2011-01-08 ENCOUNTER — Encounter: Payer: Medicare Other | Admitting: Internal Medicine

## 2011-01-19 ENCOUNTER — Other Ambulatory Visit: Payer: Self-pay | Admitting: Cardiology

## 2011-01-19 DIAGNOSIS — I6529 Occlusion and stenosis of unspecified carotid artery: Secondary | ICD-10-CM

## 2011-01-20 ENCOUNTER — Ambulatory Visit (INDEPENDENT_AMBULATORY_CARE_PROVIDER_SITE_OTHER): Payer: Medicare Other | Admitting: *Deleted

## 2011-01-20 ENCOUNTER — Encounter (INDEPENDENT_AMBULATORY_CARE_PROVIDER_SITE_OTHER): Payer: Medicare Other | Admitting: *Deleted

## 2011-01-20 DIAGNOSIS — Z7901 Long term (current) use of anticoagulants: Secondary | ICD-10-CM

## 2011-01-20 DIAGNOSIS — I4891 Unspecified atrial fibrillation: Secondary | ICD-10-CM

## 2011-01-20 DIAGNOSIS — I6529 Occlusion and stenosis of unspecified carotid artery: Secondary | ICD-10-CM

## 2011-01-20 DIAGNOSIS — G459 Transient cerebral ischemic attack, unspecified: Secondary | ICD-10-CM

## 2011-01-20 LAB — POCT INR: INR: 1.7

## 2011-01-22 ENCOUNTER — Encounter: Payer: Medicare Other | Admitting: Internal Medicine

## 2011-02-03 ENCOUNTER — Ambulatory Visit (INDEPENDENT_AMBULATORY_CARE_PROVIDER_SITE_OTHER): Payer: Medicare Other | Admitting: *Deleted

## 2011-02-03 ENCOUNTER — Ambulatory Visit (INDEPENDENT_AMBULATORY_CARE_PROVIDER_SITE_OTHER): Payer: Medicare Other | Admitting: Internal Medicine

## 2011-02-03 ENCOUNTER — Encounter: Payer: Self-pay | Admitting: Internal Medicine

## 2011-02-03 VITALS — BP 128/66 | HR 89 | Temp 97.7°F | Ht 63.0 in | Wt 190.5 lb

## 2011-02-03 DIAGNOSIS — G459 Transient cerebral ischemic attack, unspecified: Secondary | ICD-10-CM

## 2011-02-03 DIAGNOSIS — F3289 Other specified depressive episodes: Secondary | ICD-10-CM

## 2011-02-03 DIAGNOSIS — E785 Hyperlipidemia, unspecified: Secondary | ICD-10-CM

## 2011-02-03 DIAGNOSIS — I4891 Unspecified atrial fibrillation: Secondary | ICD-10-CM

## 2011-02-03 DIAGNOSIS — IMO0001 Reserved for inherently not codable concepts without codable children: Secondary | ICD-10-CM

## 2011-02-03 DIAGNOSIS — K219 Gastro-esophageal reflux disease without esophagitis: Secondary | ICD-10-CM

## 2011-02-03 DIAGNOSIS — Z23 Encounter for immunization: Secondary | ICD-10-CM

## 2011-02-03 DIAGNOSIS — E1149 Type 2 diabetes mellitus with other diabetic neurological complication: Secondary | ICD-10-CM

## 2011-02-03 DIAGNOSIS — I6529 Occlusion and stenosis of unspecified carotid artery: Secondary | ICD-10-CM

## 2011-02-03 DIAGNOSIS — E119 Type 2 diabetes mellitus without complications: Secondary | ICD-10-CM

## 2011-02-03 DIAGNOSIS — E781 Pure hyperglyceridemia: Secondary | ICD-10-CM

## 2011-02-03 DIAGNOSIS — I10 Essential (primary) hypertension: Secondary | ICD-10-CM

## 2011-02-03 DIAGNOSIS — E039 Hypothyroidism, unspecified: Secondary | ICD-10-CM

## 2011-02-03 DIAGNOSIS — Z7901 Long term (current) use of anticoagulants: Secondary | ICD-10-CM

## 2011-02-03 DIAGNOSIS — F329 Major depressive disorder, single episode, unspecified: Secondary | ICD-10-CM

## 2011-02-03 LAB — POCT INR: INR: 1.8

## 2011-02-03 LAB — COMPREHENSIVE METABOLIC PANEL
ALT: 31 U/L (ref 0–35)
BUN: 16 mg/dL (ref 6–23)
CO2: 31 mEq/L (ref 19–32)
Calcium: 10.5 mg/dL (ref 8.4–10.5)
Chloride: 101 mEq/L (ref 96–112)
Creat: 0.94 mg/dL (ref 0.50–1.10)
Glucose, Bld: 120 mg/dL — ABNORMAL HIGH (ref 70–99)
Potassium: 4.4 mEq/L (ref 3.5–5.3)
Sodium: 143 mEq/L (ref 135–145)
Total Bilirubin: 0.6 mg/dL (ref 0.3–1.2)
Total Protein: 7 g/dL (ref 6.0–8.3)

## 2011-02-03 LAB — LIPID PANEL
Cholesterol: 125 mg/dL (ref 0–200)
HDL: 37 mg/dL — ABNORMAL LOW (ref >39)
LDL Cholesterol: 52 mg/dL (ref 0–99)
Total CHOL/HDL Ratio: 3.4 Ratio
Triglycerides: 182 mg/dL — ABNORMAL HIGH (ref <150)
VLDL: 36 mg/dL (ref 0–40)

## 2011-02-03 LAB — POCT GLYCOSYLATED HEMOGLOBIN (HGB A1C): Hemoglobin A1C: 6.8

## 2011-02-03 LAB — GLUCOSE, CAPILLARY: Glucose-Capillary: 135 mg/dL — ABNORMAL HIGH (ref 70–99)

## 2011-02-03 MED ORDER — GLIPIZIDE 10 MG PO TABS: 10.0000 mg | ORAL_TABLET | Freq: Two times a day (BID) | ORAL | Status: DC

## 2011-02-03 MED ORDER — LISINOPRIL 10 MG PO TABS: 10.0000 mg | ORAL_TABLET | Freq: Every day | ORAL | Status: DC

## 2011-02-03 MED ORDER — CYCLOBENZAPRINE HCL 5 MG PO TABS: 5.0000 mg | ORAL_TABLET | Freq: Three times a day (TID) | ORAL | Status: DC | PRN

## 2011-02-03 MED ORDER — GABAPENTIN 100 MG PO CAPS: 100.0000 mg | ORAL_CAPSULE | Freq: Three times a day (TID) | ORAL | Status: DC

## 2011-02-03 MED ORDER — DIGOXIN 250 MCG PO TABS: 250.0000 ug | ORAL_TABLET | Freq: Every day | ORAL | Status: DC

## 2011-02-03 MED ORDER — METFORMIN HCL 1000 MG PO TABS: 1000.0000 mg | ORAL_TABLET | Freq: Two times a day (BID) | ORAL | Status: DC

## 2011-02-03 MED ORDER — AMITRIPTYLINE HCL 50 MG PO TABS: 50.0000 mg | ORAL_TABLET | Freq: Every day | ORAL | Status: DC

## 2011-02-03 MED ORDER — HYDROCODONE-ACETAMINOPHEN 5-500 MG PO TABS: 1.0000 | ORAL_TABLET | Freq: Two times a day (BID) | ORAL | Status: DC | PRN

## 2011-02-03 MED ORDER — PRAVASTATIN SODIUM 80 MG PO TABS: 80.0000 mg | ORAL_TABLET | Freq: Every day | ORAL | Status: DC

## 2011-02-03 MED ORDER — OMEPRAZOLE 20 MG PO CPDR: 40.0000 mg | DELAYED_RELEASE_CAPSULE | ORAL | Status: DC | PRN

## 2011-02-03 MED ORDER — FENOFIBRATE 145 MG PO TABS: 145.0000 mg | ORAL_TABLET | Freq: Every day | ORAL | Status: DC

## 2011-02-03 MED ORDER — FLUOXETINE HCL 20 MG PO CAPS: 20.0000 mg | ORAL_CAPSULE | Freq: Every day | ORAL | Status: DC

## 2011-02-03 MED ORDER — FUROSEMIDE 40 MG PO TABS: 40.0000 mg | ORAL_TABLET | Freq: Every day | ORAL | Status: DC

## 2011-02-03 MED ORDER — WARFARIN SODIUM 5 MG PO TABS: 5.0000 mg | ORAL_TABLET | Freq: Every day | ORAL | Status: DC

## 2011-02-03 MED ORDER — LEVOTHYROXINE SODIUM 75 MCG PO TABS: 75.0000 ug | ORAL_TABLET | Freq: Every day | ORAL | Status: DC

## 2011-02-03 MED ORDER — ALBUTEROL SULFATE HFA 108 (90 BASE) MCG/ACT IN AERS: 2.0000 | INHALATION_SPRAY | Freq: Four times a day (QID) | RESPIRATORY_TRACT | Status: DC | PRN

## 2011-02-03 NOTE — Progress Notes (Signed)
  Subjective:    Patient ID: [name], female    DOB: 07/11/1950, 60 y.o.   MRN: [id number]  HPI [name] is a 60 year old woman with past medical history significant for hypothyroidism, diabetes, hyperlipidemia, depression, hypertension, atrial fibrillation, GERD, and fibromyalgia who presents to the clinic today for general followup. Patient states that she has some financial concerns. Patient also mentions that there are some family issues that are related to these financial concerns.  Patient has no other complaints or concerns today. She denies chest pain, cough, sob, headache, N/V, changes in abdominal and urinary character.    Review of Systems  All other systems reviewed and are negative.       Objective:   Physical Exam  Constitutional: She appears well-developed.  HENT:  Head: Normocephalic.  Eyes: Pupils are equal, round, and reactive to light.  Neck: Normal range of motion. Neck supple.  Cardiovascular: Normal rate.        Irregular rhythm  Pulmonary/Chest: Effort normal and breath sounds normal.  Abdominal: Soft. Bowel sounds are normal.  Psychiatric:       tearful          Assessment & Plan:

## 2011-02-03 NOTE — Assessment & Plan Note (Signed)
Patient complains of worsening pain in multiple sites especially in her back. Patient has a diagnosis of fibromyalgia. Given her current depression and emotional state I presume that her pain may be subjectively worse. Patient is on Vicodin therapy for chronic pain. Will have patient sign the medication contract today.

## 2011-02-03 NOTE — Progress Notes (Signed)
Copy of Pain Contract given to pt. 

## 2011-02-03 NOTE — Assessment & Plan Note (Signed)
Lab Results  Component Value Date   HGBA1C 6.8 02/03/2011   HGBA1C 7.4 02/06/2010   CREATININE 0.66 10/22/2010   MICROALBUR 0.20 12/15/2007   MICRALBCREAT 23.8 12/15/2007   CHOL 118 02/07/2010   HDL 31* 02/07/2010   TRIG 219* 02/07/2010    Last eye exam and foot exam: No results found for this basename: HMDIABEYEEXA, HMDIABFOOTEX    Assessment: Diabetes control: controlled Progress toward goals: at goal Barriers to meeting goals: no barriers identified  Plan: Diabetes treatment: continue current medications Refer to: none Instruction/counseling given: no instruction/counseling

## 2011-02-03 NOTE — Assessment & Plan Note (Signed)
Lab Results  Component Value Date   CHOL 118 02/07/2010   HDL 31* 02/07/2010   LDLCALC 43 02/07/2010   TRIG 219* 02/07/2010   CHOLHDL 3.8 Ratio 02/07/2010   Last lipid panel was checked last year. LDL was at goal however triglycerides were slightly elevated. Patient states that she is compliant with TriCor. We'll check a lipid panel and LFTs today.

## 2011-02-03 NOTE — Assessment & Plan Note (Signed)
Recent Doppler performed November 2012. Carotid stenosis is actually stable and improved.

## 2011-02-03 NOTE — Assessment & Plan Note (Signed)
Stable on current dose of Synthroid. Patient is asymptomatic. Thyroid function tests up to date.

## 2011-02-03 NOTE — Assessment & Plan Note (Signed)
Is feeling more depressed than usual however patient attributes this to her financial problems and tension with her children. Patient is compliant with Prozac and amitriptyline therapy. Refills were provided

## 2011-02-03 NOTE — Patient Instructions (Signed)
Please continue to take all medications as directed. Please follow up in 3 months.

## 2011-02-03 NOTE — Assessment & Plan Note (Signed)
Lab Results  Component Value Date   NA 138 10/22/2010   K 3.9 10/22/2010   CL 99 10/22/2010   CO2 28 10/22/2010   BUN 15 10/22/2010   CREATININE 0.66 10/22/2010    BP Readings from Last 3 Encounters:  02/03/11 128/66  10/27/10 104/47  10/27/10 104/47    Assessment: Hypertension control:  controlled  Progress toward goals:  at goal Barriers to meeting goals:  no barriers identified  Plan: Hypertension treatment:  continue current medications

## 2011-02-04 ENCOUNTER — Encounter: Payer: Medicare Other | Admitting: *Deleted

## 2011-02-04 ENCOUNTER — Encounter: Payer: Medicare Other | Admitting: Internal Medicine

## 2011-02-17 ENCOUNTER — Ambulatory Visit (INDEPENDENT_AMBULATORY_CARE_PROVIDER_SITE_OTHER): Payer: Medicare Other | Admitting: *Deleted

## 2011-02-17 DIAGNOSIS — I4891 Unspecified atrial fibrillation: Secondary | ICD-10-CM

## 2011-02-17 DIAGNOSIS — Z7901 Long term (current) use of anticoagulants: Secondary | ICD-10-CM

## 2011-02-17 DIAGNOSIS — G459 Transient cerebral ischemic attack, unspecified: Secondary | ICD-10-CM

## 2011-02-17 LAB — POCT INR: INR: 2.4

## 2011-03-17 ENCOUNTER — Encounter: Payer: Medicare Other | Admitting: *Deleted

## 2011-03-19 ENCOUNTER — Ambulatory Visit (INDEPENDENT_AMBULATORY_CARE_PROVIDER_SITE_OTHER): Payer: Medicare Other | Admitting: *Deleted

## 2011-03-19 DIAGNOSIS — Z7901 Long term (current) use of anticoagulants: Secondary | ICD-10-CM

## 2011-03-19 DIAGNOSIS — G459 Transient cerebral ischemic attack, unspecified: Secondary | ICD-10-CM

## 2011-03-19 DIAGNOSIS — I4891 Unspecified atrial fibrillation: Secondary | ICD-10-CM | POA: Diagnosis not present

## 2011-03-19 LAB — POCT INR: INR: 2.9

## 2011-04-16 ENCOUNTER — Ambulatory Visit (INDEPENDENT_AMBULATORY_CARE_PROVIDER_SITE_OTHER): Payer: Medicare Other | Admitting: *Deleted

## 2011-04-16 DIAGNOSIS — G459 Transient cerebral ischemic attack, unspecified: Secondary | ICD-10-CM | POA: Diagnosis not present

## 2011-04-16 DIAGNOSIS — I4891 Unspecified atrial fibrillation: Secondary | ICD-10-CM

## 2011-04-16 DIAGNOSIS — Z7901 Long term (current) use of anticoagulants: Secondary | ICD-10-CM

## 2011-04-16 LAB — POCT INR: INR: 3.1

## 2011-04-23 ENCOUNTER — Other Ambulatory Visit: Payer: Self-pay | Admitting: *Deleted

## 2011-04-23 DIAGNOSIS — I10 Essential (primary) hypertension: Secondary | ICD-10-CM

## 2011-04-23 DIAGNOSIS — I4891 Unspecified atrial fibrillation: Secondary | ICD-10-CM

## 2011-04-23 DIAGNOSIS — E785 Hyperlipidemia, unspecified: Secondary | ICD-10-CM

## 2011-04-23 MED ORDER — POTASSIUM CHLORIDE ER 10 MEQ PO TBCR: 10.0000 meq | EXTENDED_RELEASE_TABLET | Freq: Every day | ORAL | Status: DC

## 2011-04-23 MED ORDER — NIACIN ER (ANTIHYPERLIPIDEMIC) 500 MG PO TBCR: 1000.0000 mg | EXTENDED_RELEASE_TABLET | Freq: Two times a day (BID) | ORAL | Status: DC

## 2011-04-23 MED ORDER — DILTIAZEM HCL ER 180 MG PO CP24: 180.0000 mg | ORAL_CAPSULE | Freq: Every day | ORAL | Status: DC

## 2011-04-23 MED ORDER — ATENOLOL 25 MG PO TABS: 25.0000 mg | ORAL_TABLET | Freq: Two times a day (BID) | ORAL | Status: DC

## 2011-04-29 ENCOUNTER — Other Ambulatory Visit: Payer: Self-pay | Admitting: Dermatology

## 2011-04-29 DIAGNOSIS — Z85828 Personal history of other malignant neoplasm of skin: Secondary | ICD-10-CM | POA: Diagnosis not present

## 2011-04-29 DIAGNOSIS — L988 Other specified disorders of the skin and subcutaneous tissue: Secondary | ICD-10-CM | POA: Diagnosis not present

## 2011-04-29 DIAGNOSIS — D485 Neoplasm of uncertain behavior of skin: Secondary | ICD-10-CM | POA: Diagnosis not present

## 2011-04-29 DIAGNOSIS — L821 Other seborrheic keratosis: Secondary | ICD-10-CM | POA: Diagnosis not present

## 2011-05-08 ENCOUNTER — Ambulatory Visit (INDEPENDENT_AMBULATORY_CARE_PROVIDER_SITE_OTHER): Payer: Medicare Other | Admitting: Internal Medicine

## 2011-05-08 ENCOUNTER — Encounter: Payer: Self-pay | Admitting: Internal Medicine

## 2011-05-08 VITALS — BP 107/71 | HR 112 | Temp 97.1°F | Ht 63.0 in | Wt 193.2 lb

## 2011-05-08 DIAGNOSIS — I4891 Unspecified atrial fibrillation: Secondary | ICD-10-CM

## 2011-05-08 DIAGNOSIS — F329 Major depressive disorder, single episode, unspecified: Secondary | ICD-10-CM

## 2011-05-08 DIAGNOSIS — Z79899 Other long term (current) drug therapy: Secondary | ICD-10-CM | POA: Diagnosis not present

## 2011-05-08 DIAGNOSIS — E119 Type 2 diabetes mellitus without complications: Secondary | ICD-10-CM | POA: Diagnosis not present

## 2011-05-08 DIAGNOSIS — I10 Essential (primary) hypertension: Secondary | ICD-10-CM | POA: Diagnosis not present

## 2011-05-08 DIAGNOSIS — E785 Hyperlipidemia, unspecified: Secondary | ICD-10-CM

## 2011-05-08 DIAGNOSIS — K449 Diaphragmatic hernia without obstruction or gangrene: Secondary | ICD-10-CM

## 2011-05-08 DIAGNOSIS — M2550 Pain in unspecified joint: Secondary | ICD-10-CM

## 2011-05-08 DIAGNOSIS — F3289 Other specified depressive episodes: Secondary | ICD-10-CM

## 2011-05-08 DIAGNOSIS — E1149 Type 2 diabetes mellitus with other diabetic neurological complication: Secondary | ICD-10-CM

## 2011-05-08 DIAGNOSIS — IMO0001 Reserved for inherently not codable concepts without codable children: Secondary | ICD-10-CM

## 2011-05-08 DIAGNOSIS — E781 Pure hyperglyceridemia: Secondary | ICD-10-CM

## 2011-05-08 DIAGNOSIS — K219 Gastro-esophageal reflux disease without esophagitis: Secondary | ICD-10-CM

## 2011-05-08 DIAGNOSIS — E039 Hypothyroidism, unspecified: Secondary | ICD-10-CM

## 2011-05-08 HISTORY — DX: Pain in unspecified joint: M25.50

## 2011-05-08 LAB — COMPLETE METABOLIC PANEL WITH GFR
ALT: 27 U/L (ref 0–35)
AST: 35 U/L (ref 0–37)
Albumin: 3.8 g/dL (ref 3.5–5.2)
Alkaline Phosphatase: 66 U/L (ref 39–117)
BUN: 11 mg/dL (ref 6–23)
Calcium: 9.2 mg/dL (ref 8.4–10.5)
Chloride: 105 mEq/L (ref 96–112)
Creat: 0.75 mg/dL (ref 0.50–1.10)
GFR, Est African American: >89 mL/min
GFR, Est Non African American: 86 mL/min
Glucose, Bld: 155 mg/dL — ABNORMAL HIGH (ref 70–99)
Total Bilirubin: 0.3 mg/dL (ref 0.3–1.2)
Total Protein: 6 g/dL (ref 6.0–8.3)

## 2011-05-08 LAB — POCT GLYCOSYLATED HEMOGLOBIN (HGB A1C): Hemoglobin A1C: 6.5

## 2011-05-08 MED ORDER — POTASSIUM CHLORIDE ER 10 MEQ PO TBCR: 10.0000 meq | EXTENDED_RELEASE_TABLET | Freq: Every day | ORAL | Status: DC

## 2011-05-08 MED ORDER — ATENOLOL 25 MG PO TABS: 25.0000 mg | ORAL_TABLET | Freq: Two times a day (BID) | ORAL | Status: DC

## 2011-05-08 MED ORDER — CYCLOBENZAPRINE HCL 5 MG PO TABS: 5.0000 mg | ORAL_TABLET | Freq: Three times a day (TID) | ORAL | Status: DC | PRN

## 2011-05-08 MED ORDER — PRAVASTATIN SODIUM 80 MG PO TABS: 80.0000 mg | ORAL_TABLET | Freq: Every day | ORAL | Status: DC

## 2011-05-08 MED ORDER — LISINOPRIL 10 MG PO TABS: 10.0000 mg | ORAL_TABLET | Freq: Every day | ORAL | Status: DC

## 2011-05-08 MED ORDER — GABAPENTIN 100 MG PO CAPS: 100.0000 mg | ORAL_CAPSULE | Freq: Three times a day (TID) | ORAL | Status: DC

## 2011-05-08 MED ORDER — AMITRIPTYLINE HCL 50 MG PO TABS: 50.0000 mg | ORAL_TABLET | Freq: Every day | ORAL | Status: DC

## 2011-05-08 MED ORDER — FENOFIBRATE 145 MG PO TABS: 145.0000 mg | ORAL_TABLET | Freq: Every day | ORAL | Status: DC

## 2011-05-08 MED ORDER — DIGOXIN 250 MCG PO TABS: 250.0000 ug | ORAL_TABLET | Freq: Every day | ORAL | Status: DC

## 2011-05-08 MED ORDER — FLUOXETINE HCL 20 MG PO CAPS: 20.0000 mg | ORAL_CAPSULE | Freq: Every day | ORAL | Status: DC

## 2011-05-08 MED ORDER — METFORMIN HCL 1000 MG PO TABS: 1000.0000 mg | ORAL_TABLET | Freq: Two times a day (BID) | ORAL | Status: DC

## 2011-05-08 MED ORDER — LEVOTHYROXINE SODIUM 75 MCG PO TABS: 75.0000 ug | ORAL_TABLET | Freq: Every day | ORAL | Status: DC

## 2011-05-08 MED ORDER — WARFARIN SODIUM 5 MG PO TABS: 5.0000 mg | ORAL_TABLET | Freq: Every day | ORAL | Status: DC

## 2011-05-08 MED ORDER — HYDROCODONE-ACETAMINOPHEN 5-500 MG PO TABS: 1.0000 | ORAL_TABLET | Freq: Two times a day (BID) | ORAL | Status: DC | PRN

## 2011-05-08 MED ORDER — DILTIAZEM HCL ER 180 MG PO CP24: 180.0000 mg | ORAL_CAPSULE | Freq: Every day | ORAL | Status: DC

## 2011-05-08 NOTE — Assessment & Plan Note (Signed)
Lab Results  Component Value Date   NA 143 02/03/2011   K 4.4 02/03/2011   CL 101 02/03/2011   CO2 31 02/03/2011   BUN 16 02/03/2011   CREATININE 0.94 02/03/2011   CREATININE 0.66 10/22/2010    BP Readings from Last 3 Encounters:  05/08/11 107/71  02/03/11 128/66  10/27/10 104/47    Assessment: Hypertension control:  controlled  Progress toward goals:  at goal Barriers to meeting goals:  no barriers identified  Plan: Hypertension treatment:  continue current medications check CMP

## 2011-05-08 NOTE — Patient Instructions (Signed)
Please follow up with CT scan as scheduled. Please follow up with surgery referral as scheduled. Please take all medications as scheduled. Please follow up in May.

## 2011-05-08 NOTE — Progress Notes (Signed)
Subjective:     Patient ID: MORENIKE CUFF, female   DOB: 19-Feb-1951, 61 y.o.   MRN: [id number]  HPI  [name] is a 61 year old woman with pmh significant for Hypothyroidism, DM, HTN, Afib, and GERD who presents for the following:   1.) Abdominal pain - epigastric pain, radiates to back. History of hiatal hernia surgery 22 years ago, and patient feels the pain is similar to that. No associated nausea or vomiting, no hx of pancreatitis, and not related with food ingestion.   2.) MCP pain - mother with history of RA. Pt experiencing pain in hands especially in PIP joints. The pain is constant, and is not worse in the mornings. Pt has pain in knees, but reports knee pain is from general arthritis. No fevers, or chills.   Patient has no other complaints or concerns today. She denies chest pain, cough, sob, headache, N/V, changes in abdominal and urinary character.   Review of Systems  All other systems reviewed and are negative.       Objective:   Physical Exam  Constitutional: She appears well-developed.  HENT:  Head: Normocephalic and atraumatic.  Eyes: EOM are normal.  Neck: Normal range of motion. Neck supple.  Cardiovascular: Normal rate and normal heart sounds.   Pulmonary/Chest: Effort normal and breath sounds normal.  Abdominal: Bowel sounds are normal. There is tenderness.         Incision located above umbilicus from prior hernia repair surgery. Lateral to the incision on left side is a bulge that is tender to palpation. No erythema or warmth appreciated. No obvious masses palpated.

## 2011-05-09 LAB — RHEUMATOID FACTOR: Rhuematoid fact SerPl-aCnc: <10 IU/mL (ref <=14)

## 2011-05-11 NOTE — Assessment & Plan Note (Signed)
Lab Results  Component Value Date   CHOL 125 02/03/2011   HDL 37* 02/03/2011   LDLCALC 52 02/03/2011   TRIG 182* 02/03/2011   CHOLHDL 3.4 02/03/2011   Triglycerides improved with Tricor. No side effects reported, continue current regimen.

## 2011-05-11 NOTE — Assessment & Plan Note (Signed)
Hx of hiatal hernia s/p repair 22 years ago with mesh insertion. Pain and tenderness located in the same region. Pt believes the mesh is out of place. When abdomen is palpated in that region, there is this peculiar "slushing" sound that is audible without stethoscope. Pain is 9/10, and pt reports is very uncomfortable. Afebrile, and isolated to that location. Called radiology regarding best study to visualize the mesh, and CT with oral contrast would be best. Pt is stable, and does not need to be admitted for this, and has agreed to have CT done as outpatient. CT scheduled for 03/06. Pt was advised to contact the clinic if the pain worsens, develops fevers and chills.

## 2011-05-11 NOTE — Assessment & Plan Note (Signed)
Lab Results  Component Value Date   HGBA1C 6.5 05/08/2011   HGBA1C 7.4 02/06/2010   CREATININE 0.75 05/08/2011   CREATININE 0.66 10/22/2010   MICROALBUR 0.20 12/15/2007   MICRALBCREAT 23.8 12/15/2007   CHOL 125 02/03/2011   HDL 37* 02/03/2011   TRIG 161* 02/03/2011    Assessment: Diabetes control: controlled Progress toward goals: at goal Barriers to meeting goals: no barriers identified  Plan: Diabetes treatment: continue current medications. Metformin and Glipizide.  Refer to: none Instruction/counseling given: reminded to get eye exam, reminded to bring blood glucose meter & log to each visit and discussed foot care

## 2011-05-11 NOTE — Assessment & Plan Note (Signed)
Joint pain occuring in PIP joints and wrists. Mother with hx of RA. Pt does not have morning stiffness that lasts less than one hour. Knees are also affected, but that is more likely consistent with OA. Will go ahead and check anti-CCP and RF, along with hand xrays.

## 2011-05-12 ENCOUNTER — Other Ambulatory Visit (HOSPITAL_COMMUNITY): Payer: Medicare Other

## 2011-05-12 LAB — CYCLIC CITRUL PEPTIDE ANTIBODY, IGG: Cyclic Citrullin Peptide Ab: <2 U/mL (ref 0.0–5.0)

## 2011-05-13 ENCOUNTER — Ambulatory Visit (INDEPENDENT_AMBULATORY_CARE_PROVIDER_SITE_OTHER): Payer: Medicare Other | Admitting: *Deleted

## 2011-05-13 DIAGNOSIS — Z7901 Long term (current) use of anticoagulants: Secondary | ICD-10-CM

## 2011-05-13 DIAGNOSIS — G459 Transient cerebral ischemic attack, unspecified: Secondary | ICD-10-CM | POA: Diagnosis not present

## 2011-05-13 DIAGNOSIS — I4891 Unspecified atrial fibrillation: Secondary | ICD-10-CM

## 2011-05-14 ENCOUNTER — Ambulatory Visit (HOSPITAL_COMMUNITY)
Admission: RE | Admit: 2011-05-14 | Discharge: 2011-05-14 | Disposition: A | Discharge status: Home / Self Care | Payer: Medicare Other | Source: Ambulatory Visit | Attending: Internal Medicine | Admitting: Internal Medicine

## 2011-05-14 DIAGNOSIS — K5730 Diverticulosis of large intestine without perforation or abscess without bleeding: Secondary | ICD-10-CM | POA: Insufficient documentation

## 2011-05-14 DIAGNOSIS — M2550 Pain in unspecified joint: Secondary | ICD-10-CM

## 2011-05-14 DIAGNOSIS — M79609 Pain in unspecified limb: Secondary | ICD-10-CM | POA: Diagnosis not present

## 2011-05-14 DIAGNOSIS — M19049 Primary osteoarthritis, unspecified hand: Secondary | ICD-10-CM | POA: Diagnosis not present

## 2011-05-14 DIAGNOSIS — R1013 Epigastric pain: Secondary | ICD-10-CM | POA: Diagnosis not present

## 2011-05-14 DIAGNOSIS — K7689 Other specified diseases of liver: Secondary | ICD-10-CM | POA: Insufficient documentation

## 2011-05-14 DIAGNOSIS — R160 Hepatomegaly, not elsewhere classified: Secondary | ICD-10-CM | POA: Insufficient documentation

## 2011-05-14 DIAGNOSIS — K449 Diaphragmatic hernia without obstruction or gangrene: Secondary | ICD-10-CM

## 2011-05-14 DIAGNOSIS — K439 Ventral hernia without obstruction or gangrene: Secondary | ICD-10-CM | POA: Diagnosis not present

## 2011-05-14 DIAGNOSIS — K4020 Bilateral inguinal hernia, without obstruction or gangrene, not specified as recurrent: Secondary | ICD-10-CM | POA: Diagnosis not present

## 2011-05-14 MED ORDER — IOHEXOL 300 MG/ML  SOLN: 80.0000 mL | Freq: Once | INTRAMUSCULAR | Status: DC | PRN

## 2011-05-26 ENCOUNTER — Telehealth (INDEPENDENT_AMBULATORY_CARE_PROVIDER_SITE_OTHER): Payer: Self-pay

## 2011-05-26 ENCOUNTER — Ambulatory Visit (INDEPENDENT_AMBULATORY_CARE_PROVIDER_SITE_OTHER): Payer: Medicare Other | Admitting: General Surgery

## 2011-05-26 ENCOUNTER — Encounter (INDEPENDENT_AMBULATORY_CARE_PROVIDER_SITE_OTHER): Payer: Self-pay | Admitting: General Surgery

## 2011-05-26 VITALS — BP 128/72 | HR 70 | Temp 97.8°F | Resp 18 | Ht 63.0 in | Wt 191.4 lb

## 2011-05-26 DIAGNOSIS — K432 Incisional hernia without obstruction or gangrene: Secondary | ICD-10-CM | POA: Diagnosis not present

## 2011-05-26 NOTE — Patient Instructions (Signed)
You have a small to medium sized ventral incisional hernia in the upper midline of your abdomen. Because this is painful we will schedule you for a laparoscopic repair of the hernia with mesh, possible open repair.  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 They may require an operation in the future.  We will ask Dr. Antoine Poche for cardiac clearance and we will ask him if we can discontinue the Coumadin 5 days preop.

## 2011-05-26 NOTE — Progress Notes (Signed)
Patient ID: [name], female   DOB: April 06, 1950, 61 y.o.   MRN: [id number]  Chief Complaint  Patient presents with  . Hiatal Hernia    HPI [name] is a 61 y.o. female.  She is referred by Dr. Baltazar Apo at [facility] internal medicine clinic for evaluation of a ventral hernia. Dr. Antoine Poche is her cardiologist.  This patient had an open hiatal hernia and antireflux repair by Dr. Theron Arista young 23 years ago. She does fairly well from that but occasionally has to have esophageal dilatation.  She's noticed a painful bulge slightly to the left of the midline midway between her xiphoid and her umbilicus. She's not had any nausea or vomiting. She also wanted me to check a small nodule that has been on her left arm triceps area for about one week. She denies trauma but does states it is bruised.  A CT scan has been done on March 1. This shows a small ventral hernia in the upper midline containing fat. There are also bilateral fat-containing inguinal hernias.  Past surgical history includes a hiatal hernia repair, abdominal hysterectomy, and some type of surgery through an infraumbilical incision. She be she also may have had a femoral hernia repair.  Significant comorbidities include atrial fibrillation on Coumadin, TIA, GERD, hypertension, depression, obesity, hyperlipidemia, diabetes. She is on numerous medications.  She is relatively comfortable today and is excision 89 her options are for appearing her ventral incisional hernia. HPI  Past Medical History  Diagnosis Date  . Dysphagia   . Depression   . Chest pain   . Obesity   . Skin neoplasm   . Bunion   . Carotid stenosis   . Fibromyalgia   . Internal hemorrhoid   . GERD (gastroesophageal reflux disease)   . Chronic gastritis   . Transaminase or LDH elevation   . Mild cognitive impairment   . Hyperlipidemia   . TIA (transient ischemic attack)   . Fatty liver   . Lumbar back pain   . Diabetic peripheral neuropathy   .  Diverticulosis   . Esophageal stricture   . Atrial fibrillation   . Osteoarthritis   . Hypertension   . Hypothyroidism   . Diabetes mellitus   . History of hiatal hernia   . Cholecystitis     s/p cholecystectomy  . OSA (obstructive sleep apnea)   . Sarcoidosis   . Breast mass, right     benign  . Fibromyalgia   . Seizures     epilepsy as cjilh.No meds since age of 46.  Marland Kitchen CHF (congestive heart failure)   . Cancer     skin  . Trouble swallowing   . Cough     Past Surgical History  Procedure Date  . Vaginal hysterectomy   . Cholecystectomy   . Hiatal hernia repair   . Esophageal dilation   . Excision of milk duct     right breast  . Hernia surgery     femoral  . Right foot surgery   . Left foot surgery   . Cataract extraction w/phaco 10/27/2010    Procedure: CATARACT EXTRACTION PHACO AND INTRAOCULAR LENS PLACEMENT (IOC);  Surgeon: Susa Simmonds;  Location: [facility];  Service: Ophthalmology;  Laterality: Left;  CDE- 1.78  . Abdominal hysterectomy     Family History  Problem Relation Age of Onset  . Crohn's disease Mother   . Colitis Mother     Crohns  . Cancer Father   . Diabetes  Father   . Heart disease Father   . Melanoma Father   . Diabetes Sister   . Depression Maternal Uncle   . Dementia Maternal Uncle   . Anesthesia problems Neg Hx   . Hypotension Neg Hx   . Malignant hyperthermia Neg Hx   . Pseudochol deficiency Neg Hx     Social History History  Substance Use Topics  . Smoking status: Never Smoker   . Smokeless tobacco: Never Used  . Alcohol Use: No    Allergies  Allergen Reactions  . Latex Other (See Comments)    Blisters where touched or applied  . Adhesive (Tape) Other (See Comments)    Will blister skin where applied - do not use BAND-AIDS.  Marland Kitchen Gemfibrozil Swelling    REACTION: Angioedema  . Codeine Hives    Will spread in patches all over the body.  . Penicillins Hives    Will spread in patches all over the body.    Current  Outpatient Prescriptions  Medication Sig Dispense Refill  . albuterol (PROVENTIL HFA;VENTOLIN HFA) 108 (90 BASE) MCG/ACT inhaler Inhale 2 puffs into the lungs every 6 (six) hours as needed. For shortness of breath  3 Inhaler  3  . amitriptyline (ELAVIL) 50 MG tablet Take 1 tablet (50 mg total) by mouth at bedtime.  90 tablet  6  . atenolol (TENORMIN) 25 MG tablet Take 1 tablet (25 mg total) by mouth 2 (two) times daily.  180 tablet  3  . B Complex-Biotin-FA (SUPER B-50 B COMPLEX PO) Take 1 tablet by mouth 2 (two) times daily.        . Calcium Carbonate-Vitamin D (CALCIUM 600+D) 600-400 MG-UNIT per tablet Take 1 tablet by mouth 2 (two) times daily with a meal.        . cyclobenzaprine (FLEXERIL) 5 MG tablet Take 1 tablet (5 mg total) by mouth 3 (three) times daily as needed. For fibromyalgia  90 tablet  3  . digoxin (LANOXIN) 0.25 MG tablet Take 1 tablet (250 mcg total) by mouth daily.  90 tablet  3  . diltiazem (DILACOR XR) 180 MG 24 hr capsule Take 1 capsule (180 mg total) by mouth daily.  90 capsule  3  . fenofibrate (TRICOR) 145 MG tablet Take 1 tablet (145 mg total) by mouth daily.  90 tablet  3  . fish oil-omega-3 fatty acids 1000 MG capsule Take 2 g by mouth 2 (two) times daily.       Marland Kitchen FLUoxetine (PROZAC) 20 MG capsule Take 1 capsule (20 mg total) by mouth daily.  90 capsule  3  . furosemide (LASIX) 40 MG tablet Take 1 tablet (40 mg total) by mouth daily.  90 tablet  3  . gabapentin (NEURONTIN) 100 MG capsule Take 1 capsule (100 mg total) by mouth 3 (three) times daily.  90 capsule  6  . glipiZIDE (GLUCOTROL) 10 MG tablet Take 1 tablet (10 mg total) by mouth 2 (two) times daily before a meal.  180 tablet  3  . HYDROcodone-acetaminophen (VICODIN) 5-500 MG per tablet Take 1 tablet by mouth every 12 (twelve) hours as needed. For pain  90 tablet  1  . levothyroxine (SYNTHROID, LEVOTHROID) 75 MCG tablet Take 1 tablet (75 mcg total) by mouth daily.  90 tablet  3  . lisinopril (PRINIVIL,ZESTRIL) 10  MG tablet Take 1 tablet (10 mg total) by mouth daily.  90 tablet  3  . metFORMIN (GLUCOPHAGE) 1000 MG tablet Take 1 tablet (1,000 mg  total) by mouth 2 (two) times daily with a meal.  180 tablet  3  . Multiple Vitamin (MULTIVITAMIN) capsule Take 1 capsule by mouth daily.       . niacin (NIASPAN) 500 MG CR tablet Take 2 tablets (1,000 mg total) by mouth 2 (two) times daily.  360 tablet  3  . omeprazole (PRILOSEC) 20 MG capsule Take 2 capsules (40 mg total) by mouth as needed.  90 capsule  3  . potassium chloride (K-DUR) 10 MEQ tablet Take 1 tablet (10 mEq total) by mouth daily.  90 tablet  3  . pravastatin (PRAVACHOL) 80 MG tablet Take 1 tablet (80 mg total) by mouth daily.  90 tablet  3  . warfarin (COUMADIN) 5 MG tablet Take 1 tablet (5 mg total) by mouth daily.  90 tablet  3    Review of Systems Review of Systems  Constitutional: Negative for fever, chills and unexpected weight change.  HENT: Negative for hearing loss, congestion, sore throat, trouble swallowing and voice change.   Eyes: Negative for visual disturbance.  Respiratory: Negative for cough and wheezing.   Cardiovascular: Negative for chest pain, palpitations and leg swelling.  Gastrointestinal: Positive for abdominal pain. Negative for nausea, vomiting, diarrhea, constipation, blood in stool, abdominal distention and anal bleeding.  Genitourinary: Negative for hematuria, vaginal bleeding and difficulty urinating.  Musculoskeletal: Negative for arthralgias.  Skin: Negative for rash and wound.  Neurological: Negative for seizures, syncope and headaches.  Hematological: Negative for adenopathy. Does not bruise/bleed easily.  Psychiatric/Behavioral: Negative for confusion.    Blood pressure 128/72, pulse 70, temperature 97.8 F (36.6 C), temperature source Temporal, resp. rate 18, height 5\' 3"  (1.6 m), weight 191 lb 6.4 oz (86.818 kg), last menstrual period 06/03/1978.  Physical Exam Physical Exam  Constitutional: She is  oriented to person, place, and time. She appears well-developed and well-nourished. No distress.  HENT:  Head: Normocephalic and atraumatic.  Nose: Nose normal.  Mouth/Throat: No oropharyngeal exudate.  Eyes: Conjunctivae and EOM are normal. Pupils are equal, round, and reactive to light. Left eye exhibits no discharge. No scleral icterus.  Neck: Neck supple. No JVD present. No tracheal deviation present. No thyromegaly present.  Cardiovascular: Normal rate, regular rhythm, normal heart sounds and intact distal pulses.   No murmur heard. Pulmonary/Chest: Effort normal and breath sounds normal. No respiratory distress. She has no wheezes. She has no rales. She exhibits no tenderness.  Abdominal: Soft. Bowel sounds are normal. She exhibits no distension and no mass. There is no tenderness. There is no rebound and no guarding.    Musculoskeletal: She exhibits no edema and no tenderness.  Lymphadenopathy:    She has no cervical adenopathy.  Neurological: She is alert and oriented to person, place, and time. She exhibits normal muscle tone. Coordination normal.  Skin: Skin is warm. No rash noted. She is not diaphoretic. No erythema. No pallor.  Psychiatric: She has a normal mood and affect. Her behavior is normal. Judgment and thought content normal.    Data Reviewed I have reviewed her CT scan. I reviewed her office notes from [facility]. I reviewed our old records. Assessment    Symptomatic ventral incisional hernia, in midepigastrium. This should be amenable to laparoscopic repair with mesh, if her adhesions are not too bad. Otherwise it may have to be converted to an open procedure.  Bilateral inguinal hernias. These are relatively asymptomatic at this time and do not warrant immediate repair. They may require repair in the future. I  do not think they should be repaired at the same time as the ventral hernia.  Small hematoma left upper extremity, triceps area. Suspect minor trauma and  Coumadin as the cause. Suspect this will resolve in 6 weeks or so.  Chronic atrial fibrillation on Coumadin  Non-insulin-dependent diabetes mellitus  Mild obesity  Hypertension  Anxiety and depression  TIAs  History of open hiatal hernia repair   Plan    The patient strongly desires repair of the ventral incisional hernia, and so we will schedule her for laparoscopic repair of the ventral incisional hernia with mesh, possible open.  We will ask for cardiac clearance with Dr. Antoine Poche  and we will ask if we can stop the Coumadin 5 days preop  I discussed the indications and details of surgery with her. Risks and complications have been outlined, including but not limited to bleeding, infection, injury to the intestine, recurrence of the hernia, conversion to open laparotomy, cardiac, pulmonary, and thromboembolic problems. She understands these issues. Her questions were answered. She agrees with this plan.       Angelia Mould. Derrell Lolling, M.D., [facility] General and Minimally invasive Surgery Breast and Colorectal Surgery Office:   [phone] Pager:   [phone]  05/26/2011, 3:26 PM

## 2011-05-26 NOTE — Telephone Encounter (Signed)
Cardiac clearance request faxed to Dr Antoine Poche. Order sheet complete and being held. Orders in epic.

## 2011-06-08 ENCOUNTER — Emergency Department (HOSPITAL_COMMUNITY): Payer: Medicare Other

## 2011-06-08 ENCOUNTER — Emergency Department (HOSPITAL_COMMUNITY)
Admission: EM | Admit: 2011-06-08 | Discharge: 2011-06-08 | Disposition: A | Discharge status: Home / Self Care | Payer: Medicare Other | Attending: Emergency Medicine | Admitting: Emergency Medicine

## 2011-06-08 ENCOUNTER — Encounter (HOSPITAL_COMMUNITY): Payer: Self-pay | Admitting: Emergency Medicine

## 2011-06-08 DIAGNOSIS — E785 Hyperlipidemia, unspecified: Secondary | ICD-10-CM | POA: Diagnosis not present

## 2011-06-08 DIAGNOSIS — D869 Sarcoidosis, unspecified: Secondary | ICD-10-CM | POA: Insufficient documentation

## 2011-06-08 DIAGNOSIS — K7689 Other specified diseases of liver: Secondary | ICD-10-CM | POA: Diagnosis not present

## 2011-06-08 DIAGNOSIS — E039 Hypothyroidism, unspecified: Secondary | ICD-10-CM | POA: Insufficient documentation

## 2011-06-08 DIAGNOSIS — R110 Nausea: Secondary | ICD-10-CM | POA: Diagnosis not present

## 2011-06-08 DIAGNOSIS — Z8673 Personal history of transient ischemic attack (TIA), and cerebral infarction without residual deficits: Secondary | ICD-10-CM | POA: Diagnosis not present

## 2011-06-08 DIAGNOSIS — R1031 Right lower quadrant pain: Secondary | ICD-10-CM | POA: Diagnosis not present

## 2011-06-08 DIAGNOSIS — F329 Major depressive disorder, single episode, unspecified: Secondary | ICD-10-CM | POA: Diagnosis not present

## 2011-06-08 DIAGNOSIS — G4733 Obstructive sleep apnea (adult) (pediatric): Secondary | ICD-10-CM | POA: Insufficient documentation

## 2011-06-08 DIAGNOSIS — I509 Heart failure, unspecified: Secondary | ICD-10-CM | POA: Diagnosis not present

## 2011-06-08 DIAGNOSIS — E119 Type 2 diabetes mellitus without complications: Secondary | ICD-10-CM | POA: Diagnosis not present

## 2011-06-08 DIAGNOSIS — Z79899 Other long term (current) drug therapy: Secondary | ICD-10-CM | POA: Diagnosis not present

## 2011-06-08 DIAGNOSIS — I4891 Unspecified atrial fibrillation: Secondary | ICD-10-CM | POA: Insufficient documentation

## 2011-06-08 DIAGNOSIS — Z7901 Long term (current) use of anticoagulants: Secondary | ICD-10-CM | POA: Diagnosis not present

## 2011-06-08 DIAGNOSIS — K5730 Diverticulosis of large intestine without perforation or abscess without bleeding: Secondary | ICD-10-CM | POA: Diagnosis not present

## 2011-06-08 DIAGNOSIS — I10 Essential (primary) hypertension: Secondary | ICD-10-CM | POA: Diagnosis not present

## 2011-06-08 DIAGNOSIS — M1990 Unspecified osteoarthritis, unspecified site: Secondary | ICD-10-CM | POA: Insufficient documentation

## 2011-06-08 DIAGNOSIS — G40909 Epilepsy, unspecified, not intractable, without status epilepticus: Secondary | ICD-10-CM | POA: Diagnosis not present

## 2011-06-08 DIAGNOSIS — F3289 Other specified depressive episodes: Secondary | ICD-10-CM | POA: Diagnosis not present

## 2011-06-08 DIAGNOSIS — K4020 Bilateral inguinal hernia, without obstruction or gangrene, not specified as recurrent: Secondary | ICD-10-CM | POA: Diagnosis not present

## 2011-06-08 DIAGNOSIS — K219 Gastro-esophageal reflux disease without esophagitis: Secondary | ICD-10-CM | POA: Insufficient documentation

## 2011-06-08 DIAGNOSIS — R109 Unspecified abdominal pain: Secondary | ICD-10-CM | POA: Diagnosis not present

## 2011-06-08 LAB — DIFFERENTIAL
Basophils Relative: 0 % (ref 0–1)
Eosinophils Absolute: 0.4 10*3/uL (ref 0.0–0.7)
Eosinophils Relative: 3 % (ref 0–5)
Monocytes Relative: 6 % (ref 3–12)
Neutro Abs: 10.7 10*3/uL — ABNORMAL HIGH (ref 1.7–7.7)
Neutrophils Relative %: 71 % (ref 43–77)

## 2011-06-08 LAB — COMPREHENSIVE METABOLIC PANEL
Albumin: 3.7 g/dL (ref 3.5–5.2)
Alkaline Phosphatase: 63 U/L (ref 39–117)
BUN: 8 mg/dL (ref 6–23)
Calcium: 10.2 mg/dL (ref 8.4–10.5)
Creatinine, Ser: 0.58 mg/dL (ref 0.50–1.10)
GFR calc Af Amer: >90 mL/min (ref >90)
GFR calc non Af Amer: >90 mL/min (ref >90)
Glucose, Bld: 121 mg/dL — ABNORMAL HIGH (ref 70–99)
Potassium: 3.9 mEq/L (ref 3.5–5.1)
Total Protein: 6.8 g/dL (ref 6.0–8.3)

## 2011-06-08 LAB — CBC
HCT: 41 % (ref 36.0–46.0)
MCH: 32 pg (ref 26.0–34.0)
MCHC: 33.9 g/dL (ref 30.0–36.0)
MCV: 94.3 fL (ref 78.0–100.0)
Platelets: 204 10*3/uL (ref 150–400)
RBC: 4.35 MIL/uL (ref 3.87–5.11)
WBC: 15.1 10*3/uL — ABNORMAL HIGH (ref 4.0–10.5)

## 2011-06-08 LAB — LIPASE, BLOOD: Lipase: 34 U/L (ref 11–59)

## 2011-06-08 LAB — URINALYSIS, ROUTINE W REFLEX MICROSCOPIC
Bilirubin Urine: NEGATIVE
Glucose, UA: NEGATIVE mg/dL
Hgb urine dipstick: NEGATIVE
Leukocytes, UA: NEGATIVE
Protein, ur: NEGATIVE mg/dL
Specific Gravity, Urine: 1.007 (ref 1.005–1.030)
pH: 7 (ref 5.0–8.0)

## 2011-06-08 MED ORDER — PROMETHAZINE HCL 25 MG PO TABS: 25.0000 mg | ORAL_TABLET | Freq: Four times a day (QID) | ORAL | Status: DC | PRN

## 2011-06-08 MED ORDER — IOHEXOL 300 MG/ML  SOLN: 20.0000 mL | INTRAMUSCULAR | Status: AC

## 2011-06-08 MED ORDER — IOHEXOL 300 MG/ML  SOLN
100.0000 mL | Freq: Once | INTRAMUSCULAR | Status: AC | PRN
  Administered 2011-06-08: 100 mL via INTRAVENOUS

## 2011-06-08 MED ORDER — HYDROMORPHONE HCL PF 1 MG/ML IJ SOLN
1.0000 mg | Freq: Once | INTRAMUSCULAR | Status: AC
  Administered 2011-06-08: 1 mg via INTRAVENOUS
  Filled 2011-06-08: qty 1

## 2011-06-08 MED ORDER — ONDANSETRON HCL 4 MG/2ML IJ SOLN
4.0000 mg | Freq: Once | INTRAMUSCULAR | Status: AC
  Administered 2011-06-08: 4 mg via INTRAVENOUS
  Filled 2011-06-08: qty 2

## 2011-06-08 MED ORDER — HYDROCODONE-ACETAMINOPHEN 5-325 MG PO TABS: 1.0000 | ORAL_TABLET | ORAL | Status: AC | PRN

## 2011-06-08 MED ORDER — SODIUM CHLORIDE 0.9 % IV SOLN
Freq: Once | INTRAVENOUS | Status: AC
  Administered 2011-06-08: 14:00:00 via INTRAVENOUS

## 2011-06-08 NOTE — ED Provider Notes (Signed)
History     CSN: [id number]  Arrival date & time 06/08/11  1153   First MD Initiated Contact with Patient 06/08/11 1245      Chief Complaint  Patient presents with  . Abdominal Pain    (Consider location/radiation/quality/duration/timing/severity/associated sxs/prior treatment) Patient is a 61 y.o. female presenting with abdominal pain. The history is provided by the patient.  Abdominal Pain The primary symptoms of the illness include abdominal pain.   patient here with right lower quadrant abdominal pain which started yesterday and became worse today. Pain is sharp and crampy. Does radiate to her left side. Some nausea but no vomiting. No fever. Some loose stools. Denies any urinary symptoms. Nothing makes her symptoms better or worse and no prior history of this.  Past Medical History  Diagnosis Date  . Dysphagia   . Depression   . Chest pain   . Obesity   . Skin neoplasm   . Bunion   . Carotid stenosis   . Fibromyalgia   . Internal hemorrhoid   . GERD (gastroesophageal reflux disease)   . Chronic gastritis   . Transaminase or LDH elevation   . Mild cognitive impairment   . Hyperlipidemia   . TIA (transient ischemic attack)   . Fatty liver   . Lumbar back pain   . Diabetic peripheral neuropathy   . Diverticulosis   . Esophageal stricture   . Atrial fibrillation   . Osteoarthritis   . Hypertension   . Hypothyroidism   . Diabetes mellitus   . History of hiatal hernia   . Cholecystitis     s/p cholecystectomy  . OSA (obstructive sleep apnea)   . Sarcoidosis   . Breast mass, right     benign  . Fibromyalgia   . Seizures     epilepsy as cjilh.No meds since age of 6.  Marland Kitchen CHF (congestive heart failure)   . Cancer     skin  . Trouble swallowing   . Cough     Past Surgical History  Procedure Date  . Vaginal hysterectomy   . Cholecystectomy   . Hiatal hernia repair   . Esophageal dilation   . Excision of milk duct     right breast  . Hernia surgery    femoral  . Right foot surgery   . Left foot surgery   . Cataract extraction w/phaco 10/27/2010    Procedure: CATARACT EXTRACTION PHACO AND INTRAOCULAR LENS PLACEMENT (IOC);  Surgeon: Susa Simmonds;  Location: [facility];  Service: Ophthalmology;  Laterality: Left;  CDE- 1.78  . Abdominal hysterectomy     Family History  Problem Relation Age of Onset  . Crohn's disease Mother   . Colitis Mother     Crohns  . Cancer Father   . Diabetes Father   . Heart disease Father   . Melanoma Father   . Diabetes Sister   . Depression Maternal Uncle   . Dementia Maternal Uncle   . Anesthesia problems Neg Hx   . Hypotension Neg Hx   . Malignant hyperthermia Neg Hx   . Pseudochol deficiency Neg Hx     History  Substance Use Topics  . Smoking status: Never Smoker   . Smokeless tobacco: Never Used  . Alcohol Use: No    OB History    Grav Para Term Preterm Abortions TAB SAB Ect Mult Living                  Review of Systems  Gastrointestinal: Positive for abdominal pain.  All other systems reviewed and are negative.    Allergies  Latex; Adhesive; Gemfibrozil; Codeine; and Penicillins  Home Medications   Current Outpatient Rx  Name Route Sig Dispense Refill  . ALBUTEROL SULFATE HFA 108 (90 BASE) MCG/ACT IN AERS Inhalation Inhale 2 puffs into the lungs every 6 (six) hours as needed. For shortness of breath    . AMITRIPTYLINE HCL 50 MG PO TABS Oral Take 50 mg by mouth at bedtime.    . ATENOLOL 25 MG PO TABS Oral Take 25 mg by mouth 2 (two) times daily.    . SUPER B-50 B COMPLEX PO Oral Take 1 tablet by mouth 2 (two) times daily.     Marland Kitchen CALCIUM CARBONATE-VITAMIN D 600-400 MG-UNIT PO TABS Oral Take 1 tablet by mouth 2 (two) times daily with a meal.     . CYCLOBENZAPRINE HCL 5 MG PO TABS Oral Take 5 mg by mouth 3 (three) times daily as needed. For fibromyalgia    . DIGOXIN 0.25 MG PO TABS Oral Take 250 mcg by mouth daily.    Marland Kitchen DILTIAZEM HCL ER 180 MG PO CP24 Oral Take 180 mg by mouth  daily.    . OMEGA-3 FATTY ACIDS 1000 MG PO CAPS Oral Take 1 g by mouth 2 (two) times daily.     Marland Kitchen FLUOXETINE HCL 20 MG PO CAPS Oral Take 20 mg by mouth daily.    . FUROSEMIDE 40 MG PO TABS Oral Take 1 tablet (40 mg total) by mouth daily. 90 tablet 3  . GABAPENTIN 100 MG PO CAPS Oral Take 1 capsule (100 mg total) by mouth 3 (three) times daily. 90 capsule 6  . GLIPIZIDE 10 MG PO TABS Oral Take 1 tablet (10 mg total) by mouth 2 (two) times daily before a meal. 180 tablet 3  . HYDROCODONE-ACETAMINOPHEN 5-500 MG PO TABS Oral Take 1 tablet by mouth every 12 (twelve) hours as needed. For pain 90 tablet 1  . LEVOTHYROXINE SODIUM 75 MCG PO TABS Oral Take 1 tablet (75 mcg total) by mouth daily. 90 tablet 3  . LISINOPRIL 10 MG PO TABS Oral Take 1 tablet (10 mg total) by mouth daily. 90 tablet 3  . METFORMIN HCL 1000 MG PO TABS Oral Take 1 tablet (1,000 mg total) by mouth 2 (two) times daily with a meal. 180 tablet 3  . MULTIVITAMINS PO CAPS Oral Take 1 capsule by mouth daily.     Marland Kitchen NIACIN ER (ANTIHYPERLIPIDEMIC) 500 MG PO TBCR Oral Take 2 tablets (1,000 mg total) by mouth 2 (two) times daily. 360 tablet 3  . OMEPRAZOLE 20 MG PO CPDR Oral Take 40 mg by mouth as needed. For acid reflux    . POTASSIUM CHLORIDE ER 10 MEQ PO TBCR Oral Take 10 mEq by mouth daily.    Marland Kitchen PRAVASTATIN SODIUM 80 MG PO TABS Oral Take 1 tablet (80 mg total) by mouth daily. 90 tablet 3  . WARFARIN SODIUM 5 MG PO TABS Oral Take 2.5-5 mg by mouth daily. Takes 5 mg on Monday Wednesday and Friday. Takes 2.5mg  on Tuesday, Thursday, Saturday, and Sunday.    . FENOFIBRATE 145 MG PO TABS Oral Take 145 mg by mouth daily.    . WARFARIN SODIUM 5 MG PO TABS Oral Take 1 tablet (5 mg total) by mouth as directed. 90 tablet 0    BP 130/62  Pulse 79  Temp(Src) 98.4 F (36.9 C) (Oral)  Resp 16  SpO2 100%  LMP 06/03/1978  Physical Exam  Nursing note and vitals reviewed. Constitutional: She is oriented to person, place, and time. Vital signs are  normal. She appears well-developed and well-nourished.  Non-toxic appearance. No distress.  HENT:  Head: Normocephalic and atraumatic.  Eyes: Conjunctivae, EOM and lids are normal. Pupils are equal, round, and reactive to light.  Neck: Normal range of motion. Neck supple. No tracheal deviation present. No mass present.  Cardiovascular: Normal rate, regular rhythm and normal heart sounds.  Exam reveals no gallop.   No murmur heard. Pulmonary/Chest: Effort normal and breath sounds normal. No stridor. No respiratory distress. She has no decreased breath sounds. She has no wheezes. She has no rhonchi. She has no rales.  Abdominal: Soft. Normal appearance and bowel sounds are normal. She exhibits no distension. There is tenderness in the right lower quadrant. There is guarding. There is no rigidity, no rebound and no CVA tenderness.  Musculoskeletal: Normal range of motion. She exhibits no edema and no tenderness.  Neurological: She is alert and oriented to person, place, and time. She has normal strength. No cranial nerve deficit or sensory deficit. GCS eye subscore is 4. GCS verbal subscore is 5. GCS motor subscore is 6.  Skin: Skin is warm and dry. No abrasion and no rash noted.  Psychiatric: She has a normal mood and affect. Her speech is normal and behavior is normal.    ED Course  Procedures (including critical care time)   Labs Reviewed  URINALYSIS, ROUTINE W REFLEX MICROSCOPIC  URINALYSIS, ROUTINE W REFLEX MICROSCOPIC  CBC  DIFFERENTIAL  COMPREHENSIVE METABOLIC PANEL  LIPASE, BLOOD   No results found.   No diagnosis found.    MDM  Pt sent  To [facility] to wait for abd ct resdults        Toy Baker, MD 06/08/11 [phone]

## 2011-06-08 NOTE — ED Notes (Signed)
Pt reports having abdominal pain.  Pt has generalized tenderness-seems to be more tender on the RLQ.  Pt reports increased pain with urination.  Pt nauseated.  Hx of multiple hernias.  No distress noted.

## 2011-06-08 NOTE — ED Notes (Signed)
Patient transported to CT 

## 2011-06-08 NOTE — ED Notes (Signed)
Pt. [name], i started having sharp abd. Pain this am about 2 hours ago with a little HA.

## 2011-06-08 NOTE — Discharge Instructions (Signed)
Ms [name] the CT shows no appendicitis today.  Follow up with Dr. Derrell Lolling asap for your hernia repair.  Keep you appointment with [place] this week.  Take the pain meds but do not drive with this.  Do not drive with the nausea meds either.  Return to ER for severe pain or other concerns.   Abdominal Pain Many things can cause belly (abdominal) pain. Most times, the belly pain is not dangerous. The amount of belly pain does not tell how serious the problem may be. Many cases of belly pain can be watched and treated at home. HOME CARE   Do not take medicines that help you go poop (laxatives) unless told to by your doctor.   Only take medicine as told by your doctor.   Eat or drink as told by your doctor. Your doctor will tell you if you should be on a special diet.  GET HELP RIGHT AWAY IF:   The pain does not go away.   You have a fever.   You keep throwing up (vomiting).   The pain changes and is only in the right or left part of the belly.   You have bloody or tarry looking poop.  MAKE SURE YOU:   Understand these instructions.   Will watch your condition.   Will get help right away if you are not doing well or get worse.  Document Released: 08/12/2007 Document Revised: 02/12/2011 Document Reviewed: 03/11/2009 [facility] Patient Information 2012 [place], [place].

## 2011-06-09 ENCOUNTER — Other Ambulatory Visit: Payer: Self-pay | Admitting: *Deleted

## 2011-06-09 MED ORDER — FENOFIBRATE 145 MG PO TABS: 145.0000 mg | ORAL_TABLET | Freq: Every day | ORAL | Status: DC

## 2011-06-09 NOTE — Telephone Encounter (Signed)
Pt called and can not afford tricor. Can you change to generic brand?

## 2011-06-10 ENCOUNTER — Ambulatory Visit (INDEPENDENT_AMBULATORY_CARE_PROVIDER_SITE_OTHER): Payer: Medicare Other | Admitting: Physician Assistant

## 2011-06-10 ENCOUNTER — Ambulatory Visit (INDEPENDENT_AMBULATORY_CARE_PROVIDER_SITE_OTHER): Payer: Medicare Other | Admitting: Pharmacist

## 2011-06-10 ENCOUNTER — Encounter: Payer: Self-pay | Admitting: Physician Assistant

## 2011-06-10 VITALS — BP 109/64 | HR 68 | Resp 18 | Ht 63.0 in | Wt 187.4 lb

## 2011-06-10 DIAGNOSIS — G459 Transient cerebral ischemic attack, unspecified: Secondary | ICD-10-CM

## 2011-06-10 DIAGNOSIS — I10 Essential (primary) hypertension: Secondary | ICD-10-CM

## 2011-06-10 DIAGNOSIS — R079 Chest pain, unspecified: Secondary | ICD-10-CM | POA: Diagnosis not present

## 2011-06-10 DIAGNOSIS — I4891 Unspecified atrial fibrillation: Secondary | ICD-10-CM

## 2011-06-10 DIAGNOSIS — Z7901 Long term (current) use of anticoagulants: Secondary | ICD-10-CM | POA: Diagnosis not present

## 2011-06-10 LAB — POCT INR: INR: 3

## 2011-06-10 NOTE — Patient Instructions (Signed)
Your physician has requested that you have a lexiscan myoview. For further information please visit [email or web address] Please follow instruction sheet, as given.  You will need to have Lovenox bridging prior to surgery

## 2011-06-10 NOTE — Progress Notes (Signed)
[address] [place], [place]  [address] Phone: [phone] Fax:  [phone]  Date:  06/10/2011   Name:  [name]       DOB:  1950-05-03 MRN:  [id number]  PCP:  Dr. Baltazar Apo Primary Cardiologist:  Dr. Rollene Rotunda  Primary Electrophysiologist:  None    History of Present Illness: [name] is a 61 y.o. female who presents for surgical clearance.  She has a history of atrial fibrillation, depression, carotid stenosis, fibromyalgia, GERD, hyperlipidemia, prior TIA, hypertension, hypothyroidism, diabetes.  LHC 06/2002: Normal coronary arteries, EF 60%.  Myoview 05/2009: EF 68%, no ischemia.  Echocardiogram 05/2007: EF 55-65%, mild to moderate LAE, mild RAE.  Carotid Dopplers 01/2011: RICA 40-59%, LICA 0-39%.  Last seen 10/2010.  She was overall stable with plans for one year followup.  She has a ventral hernia that requires laparoscopic repair.  Her surgeon is Dr. Claud Kelp.  She is somewhat active.  She probably can achieve 4 METs.  She does note occ chest pain.  It is substernal and sharp.  It can come on with exertion and without. She can exert herself without pain as well.  She has dyspnea with more extreme activities that is unchanged.  No orthopnea, PND or edema.  No syncope.  She has noted chest pain for the last 2 weeks.  She continues to have a lot of abdominal pain.  Past Medical History  Diagnosis Date  . Depression   . Obesity   . Skin neoplasm   . Bunion   . Carotid stenosis   . Fibromyalgia   . Internal hemorrhoid   . GERD (gastroesophageal reflux disease)   . Chronic gastritis   . Transaminase or LDH elevation   . Mild cognitive impairment   . Hyperlipidemia   . TIA (transient ischemic attack)   . Fatty liver   . Lumbar back pain   . Diabetic peripheral neuropathy   . Diverticulosis   . Esophageal stricture   . Atrial fibrillation   . Osteoarthritis   . Hypertension   . Hypothyroidism   . Diabetes mellitus   . History of  hiatal hernia   . Cholecystitis     s/p cholecystectomy  . OSA (obstructive sleep apnea)   . Sarcoidosis   . Breast mass, right     benign  . Fibromyalgia   . Seizures     epilepsy as cjilh.No meds since age of 58.  Marland Kitchen CHF (congestive heart failure)   . Cancer     skin    Current Outpatient Prescriptions  Medication Sig Dispense Refill  . albuterol (PROVENTIL HFA;VENTOLIN HFA) 108 (90 BASE) MCG/ACT inhaler Inhale 2 puffs into the lungs every 6 (six) hours as needed. For shortness of breath      . amitriptyline (ELAVIL) 50 MG tablet Take 50 mg by mouth at bedtime.      Marland Kitchen atenolol (TENORMIN) 25 MG tablet Take 25 mg by mouth 2 (two) times daily.      . B Complex-Biotin-FA (SUPER B-50 B COMPLEX PO) Take 1 tablet by mouth 2 (two) times daily.       . Calcium Carbonate-Vitamin D (CALCIUM 600+D) 600-400 MG-UNIT per tablet Take 1 tablet by mouth 2 (two) times daily with a meal.       . cyclobenzaprine (FLEXERIL) 5 MG tablet Take 5 mg by mouth 3 (three) times daily as needed. For fibromyalgia      . digoxin (LANOXIN) 0.25 MG tablet  Take 250 mcg by mouth daily.      Marland Kitchen diltiazem (DILACOR XR) 180 MG 24 hr capsule Take 180 mg by mouth daily.      . fish oil-omega-3 fatty acids 1000 MG capsule Take 1 g by mouth 2 (two) times daily.       Marland Kitchen FLUoxetine (PROZAC) 20 MG capsule Take 20 mg by mouth daily.      . furosemide (LASIX) 40 MG tablet Take 1 tablet (40 mg total) by mouth daily.  90 tablet  3  . gabapentin (NEURONTIN) 100 MG capsule Take 1 capsule (100 mg total) by mouth 3 (three) times daily.  90 capsule  6  . glipiZIDE (GLUCOTROL) 10 MG tablet Take 1 tablet (10 mg total) by mouth 2 (two) times daily before a meal.  180 tablet  3  . HYDROcodone-acetaminophen (NORCO) 5-325 MG per tablet Take 1 tablet by mouth every 4 (four) hours as needed for pain.  12 tablet  0  . HYDROcodone-acetaminophen (VICODIN) 5-500 MG per tablet Take 1 tablet by mouth every 12 (twelve) hours as needed. For pain  90 tablet   1  . levothyroxine (SYNTHROID, LEVOTHROID) 75 MCG tablet Take 1 tablet (75 mcg total) by mouth daily.  90 tablet  3  . lisinopril (PRINIVIL,ZESTRIL) 10 MG tablet Take 1 tablet (10 mg total) by mouth daily.  90 tablet  3  . metFORMIN (GLUCOPHAGE) 1000 MG tablet Take 1 tablet (1,000 mg total) by mouth 2 (two) times daily with a meal.  180 tablet  3  . Multiple Vitamin (MULTIVITAMIN) capsule Take 1 capsule by mouth daily.       . niacin (NIASPAN) 500 MG CR tablet Take 2 tablets (1,000 mg total) by mouth 2 (two) times daily.  360 tablet  3  . omeprazole (PRILOSEC) 20 MG capsule Take 40 mg by mouth as needed. For acid reflux      . potassium chloride (K-DUR) 10 MEQ tablet Take 10 mEq by mouth daily.      . pravastatin (PRAVACHOL) 80 MG tablet Take 1 tablet (80 mg total) by mouth daily.  90 tablet  3  . promethazine (PHENERGAN) 25 MG tablet Take 1 tablet (25 mg total) by mouth every 6 (six) hours as needed for nausea.  30 tablet  0  . promethazine (PHENERGAN) 25 MG tablet Take 1 tablet (25 mg total) by mouth every 6 (six) hours as needed for nausea.  30 tablet  0  . warfarin (COUMADIN) 5 MG tablet Take 2.5-5 mg by mouth daily. Takes 5 mg on Monday Wednesday and Friday. Takes 2.5mg  on Tuesday, Thursday, Saturday, and Sunday.      . fenofibrate (TRICOR) 145 MG tablet Take 1 tablet (145 mg total) by mouth daily.  30 tablet  6  . warfarin (COUMADIN) 5 MG tablet Take 1 tablet (5 mg total) by mouth as directed.  90 tablet  0  . DISCONTD: albuterol (PROVENTIL HFA;VENTOLIN HFA) 108 (90 BASE) MCG/ACT inhaler Inhale 2 puffs into the lungs every 6 (six) hours as needed. For shortness of breath  3 Inhaler  3  . DISCONTD: albuterol (VENTOLIN HFA) 108 (90 BASE) MCG/ACT inhaler Inhale 2 puffs into the lungs every 6 (six) hours as needed.  3 Inhaler  11  . DISCONTD: amitriptyline (ELAVIL) 50 MG tablet Take 1 tablet (50 mg total) by mouth at bedtime.  90 tablet  6  . DISCONTD: atenolol (TENORMIN) 25 MG tablet Take 1  tablet (25 mg total) by mouth  2 (two) times daily.  180 tablet  3  . DISCONTD: digoxin (LANOXIN) 0.25 MG tablet Take 1 tablet (250 mcg total) by mouth daily.  90 tablet  3  . DISCONTD: diltiazem (DILACOR XR) 180 MG 24 hr capsule Take 1 capsule (180 mg total) by mouth daily.  90 capsule  3  . DISCONTD: fenofibrate (TRICOR) 145 MG tablet Take 1 tablet (145 mg total) by mouth daily.  90 tablet  3  . DISCONTD: FLUoxetine (PROZAC) 20 MG capsule Take 1 capsule (20 mg total) by mouth daily.  90 capsule  3  . DISCONTD: omeprazole (PRILOSEC) 20 MG capsule Take 2 capsules (40 mg total) by mouth as needed.  90 capsule  3  . DISCONTD: potassium chloride (K-DUR) 10 MEQ tablet Take 1 tablet (10 mEq total) by mouth daily.  90 tablet  3    Allergies: Allergies  Allergen Reactions  . Latex Other (See Comments)    Blisters where touched or applied  . Adhesive (Tape) Other (See Comments)    Will blister skin where applied - do not use BAND-AIDS.  Marland Kitchen Gemfibrozil Swelling    REACTION: Angioedema  . Codeine Hives    Will spread in patches all over the body.  . Penicillins Hives    Will spread in patches all over the body.    History  Substance Use Topics  . Smoking status: Never Smoker   . Smokeless tobacco: Never Used  . Alcohol Use: No     ROS:  Please see the history of present illness.    All other systems reviewed and negative.   PHYSICAL EXAM: VS:  BP 109/64  Pulse 68  Resp 18  Ht 5\' 3"  (1.6 m)  Wt 187 lb 6.4 oz (85.004 kg)  BMI 33.20 kg/m2  LMP 06/03/1978 Well nourished, well developed, in no acute distress HEENT: normal Neck: no JVD Endo: no thyromegaly Cardiac:  normal S1, S2; irreg irreg; no murmur Lungs:  clear to auscultation bilaterally, no wheezing, rhonchi or rales Abd: soft, nontender, no hepatomegaly Ext: no edema Skin: warm and dry Neuro:  CNs 2-12 intact, no focal abnormalities noted  EKG:  Atrial fibrillation, heart rate 68, normal axis, nonspecific ST-T wave changes,  no change from prior tracing  ASSESSMENT AND PLAN:  1. Chest pain, unspecified  Atypical > typical features.  She had a negative cath about 10 years ago.  However, she has significant risk factors that include DM2 and carotid stenosis.  I will arrange a Lexiscan Myoview prior to clearing her for surgery.  If her scan is low risk, she can proceed without further cardiovascular testing.     2. Atrial fibrillation  CHADS2-VASc score is 7.  She is in permanent AFib.  She carries a dx of potential TIA in the past.  I think she is at high enough risk for stroke to warrant bridging therapy with Lovenox while off of coumadin for her hernia repair.  We will have her arrange Lovenox with our coumadin clinic once she has a date for her procedure.   3. HYPERTENSION  Controlled.  Continue current therapy.    Signed, Tereso Newcomer, PA-C  3:20 PM 06/10/2011

## 2011-06-11 ENCOUNTER — Telehealth: Payer: Self-pay | Admitting: Cardiology

## 2011-06-11 NOTE — Telephone Encounter (Signed)
Please return call to patient at [phone] hm# or [phone] cell#   Patient was told by PA at appnt yesterday that she had a light stroke a while back, please return call to discuss.

## 2011-06-11 NOTE — Telephone Encounter (Signed)
Answered multiple questions for pt related to her TIA, At Fib, and bridging with Lovenox.

## 2011-06-17 ENCOUNTER — Ambulatory Visit (HOSPITAL_COMMUNITY): Payer: Medicare Other | Attending: Cardiology | Admitting: Radiology

## 2011-06-17 VITALS — BP 133/72 | HR 102 | Ht 63.0 in | Wt 188.0 lb

## 2011-06-17 DIAGNOSIS — Z8249 Family history of ischemic heart disease and other diseases of the circulatory system: Secondary | ICD-10-CM | POA: Diagnosis not present

## 2011-06-17 DIAGNOSIS — R110 Nausea: Secondary | ICD-10-CM | POA: Insufficient documentation

## 2011-06-17 DIAGNOSIS — I10 Essential (primary) hypertension: Secondary | ICD-10-CM | POA: Insufficient documentation

## 2011-06-17 DIAGNOSIS — I509 Heart failure, unspecified: Secondary | ICD-10-CM | POA: Insufficient documentation

## 2011-06-17 DIAGNOSIS — R0602 Shortness of breath: Secondary | ICD-10-CM | POA: Diagnosis not present

## 2011-06-17 DIAGNOSIS — R000 Tachycardia, unspecified: Secondary | ICD-10-CM | POA: Diagnosis not present

## 2011-06-17 DIAGNOSIS — E669 Obesity, unspecified: Secondary | ICD-10-CM | POA: Insufficient documentation

## 2011-06-17 DIAGNOSIS — E119 Type 2 diabetes mellitus without complications: Secondary | ICD-10-CM | POA: Diagnosis not present

## 2011-06-17 DIAGNOSIS — Z01810 Encounter for preprocedural cardiovascular examination: Secondary | ICD-10-CM | POA: Insufficient documentation

## 2011-06-17 DIAGNOSIS — R079 Chest pain, unspecified: Secondary | ICD-10-CM | POA: Diagnosis not present

## 2011-06-17 DIAGNOSIS — I779 Disorder of arteries and arterioles, unspecified: Secondary | ICD-10-CM | POA: Insufficient documentation

## 2011-06-17 DIAGNOSIS — R0789 Other chest pain: Secondary | ICD-10-CM | POA: Insufficient documentation

## 2011-06-17 DIAGNOSIS — E785 Hyperlipidemia, unspecified: Secondary | ICD-10-CM | POA: Diagnosis not present

## 2011-06-17 DIAGNOSIS — Z8673 Personal history of transient ischemic attack (TIA), and cerebral infarction without residual deficits: Secondary | ICD-10-CM | POA: Diagnosis not present

## 2011-06-17 DIAGNOSIS — R5381 Other malaise: Secondary | ICD-10-CM | POA: Diagnosis not present

## 2011-06-17 DIAGNOSIS — I4891 Unspecified atrial fibrillation: Secondary | ICD-10-CM | POA: Diagnosis not present

## 2011-06-17 DIAGNOSIS — R61 Generalized hyperhidrosis: Secondary | ICD-10-CM | POA: Insufficient documentation

## 2011-06-17 DIAGNOSIS — E781 Pure hyperglyceridemia: Secondary | ICD-10-CM

## 2011-06-17 DIAGNOSIS — R5383 Other fatigue: Secondary | ICD-10-CM | POA: Insufficient documentation

## 2011-06-17 MED ORDER — REGADENOSON 0.4 MG/5ML IV SOLN
0.4000 mg | Freq: Once | INTRAVENOUS | Status: AC
  Administered 2011-06-17: 0.4 mg via INTRAVENOUS

## 2011-06-17 MED ORDER — TECHNETIUM TC 99M TETROFOSMIN IV KIT
30.0000 | PACK | Freq: Once | INTRAVENOUS | Status: AC | PRN
  Administered 2011-06-17: 30 via INTRAVENOUS

## 2011-06-17 MED ORDER — TECHNETIUM TC 99M TETROFOSMIN IV KIT
10.0000 | PACK | Freq: Once | INTRAVENOUS | Status: AC | PRN
  Administered 2011-06-17: 10 via INTRAVENOUS

## 2011-06-17 NOTE — Progress Notes (Signed)
[facility] SITE 3 NUCLEAR MED [address] [place] [address] [phone]  Cardiology Nuclear Med Study  [name] is a 61 y.o. female     MRN : [id number]     DOB: [birth year]  Procedure Date: 06/17/2011  Nuclear Med Background Indication for Stress Test:  Evaluation for Ischemia and Pending Surgical Clearance for Hernia Repair by Dr. Claud Kelp History:  '04 Cath:Normal coronaries, EF=60%; '09 Echo:EF=65%; '11 EXB:MWUXLK, EF=68%; h/o Atrial Fibrillation and CHF Cardiac Risk Factors: Carotid Disease, Family History - CAD, Hypertension, Lipids, NIDDM, Obesity and TIA  Symptoms:  Chest Pressure with and without Exertion (last episode of chest discomfort was about 2-3 weeks ago), Diaphoresis, DOE, Fatigue, Nausea and Rapid HR   Nuclear Pre-Procedure Caffeine/Decaff Intake:  None NPO After: 9:00pm   Lungs:  clear O2 Sat: 98% on room air. IV 0.9% NS with Angio Cath:  20g  IV Site: R Antecubital  IV Started by:  Stanton Kidney, EMT-P  Chest Size (in):  42 Cup Size: C  Height: 5\' 3"  (1.6 m)  Weight:  188 lb (85.276 kg)  BMI:  Body mass index is 33.30 kg/(m^2). Tech Comments:  Atenolol held > 24 hours, per patient.    Nuclear Med Study 1 or 2 day study: 1 day  Stress Test Type:  Treadmill/Lexiscan  Reading MD: Olga Millers, MD  Order Authorizing Provider:  Rollene Rotunda, MD  Resting Radionuclide: Technetium 50m Tetrofosmin  Resting Radionuclide Dose: 10.2 mCi   Stress Radionuclide:  Technetium 50m Tetrofosmin  Stress Radionuclide Dose: 31.8 mCi           Stress Protocol Rest HR: 102 Stress HR: 150  Rest BP: 133/72 Stress BP: 137/76  Exercise Time (min): 2:00 METS: n/a   Predicted Max HR: 158 bpm % Max HR: 94.94 bpm Rate Pressure Product: 44010   Dose of Adenosine (mg):  n/a Dose of Lexiscan: 0.4 mg  Dose of Atropine (mg): n/a Dose of Dobutamine: n/a mcg/kg/min (at max HR)  Stress Test Technologist: Smiley Houseman, CMA-N  Nuclear Technologist:   Domenic Polite, CNMT     Rest Procedure:  Myocardial perfusion imaging was performed at rest 45 minutes following the intravenous administration of Technetium 89m Tetrofosmin.  Rest ECG: Atrial Fibrilliation with RVR and nonspecific ST-T wave changes.  Stress Procedure:  The patient received IV Lexiscan 0.4 mg over 15-seconds with concurrent low level exercise and then Technetium 59m Tetrofosmin was injected at 30-seconds while the patient continued walking one more minute. There were more diffuse ST-T wave changes with Lexiscan and she c/o chest tightness 10/10. Quantitative spect images were obtained after a 45-minute delay.  Stress ECG: Uninteretable due to use of digoxin.  QPS Raw Data Images:  Acquisition technically good; normal left ventricular size. Stress Images:  Normal homogeneous uptake in all areas of the myocardium. Rest Images:  Normal homogeneous uptake in all areas of the myocardium. Subtraction (SDS):  No evidence of ischemia. Transient Ischemic Dilatation (Normal <1.22):  0.97 Lung/Heart Ratio (Normal <0.45):  0.38  Quantitative Gated Spect Images QGS EDV:  69 ml QGS ESV:  17 ml  Impression Exercise Capacity:  Lexiscan with low level exercise. BP Response:  Normal blood pressure response. Clinical Symptoms:  There is chest tightness. ECG Impression:  EKG uninterpretable due to use of digoxin. Comparison with Prior Nuclear Study: No significant change from previous study  Overall Impression:  Normal stress nuclear study.  LV Ejection Fraction: 76%.  LV Wall Motion:  NL  LV Function; NL Wall Motion   Kirk Ruths

## 2011-06-19 NOTE — Progress Notes (Signed)
EKG orders entered in error.   W.Kieth Hartis,RT-N

## 2011-06-22 ENCOUNTER — Telehealth: Payer: Self-pay | Admitting: *Deleted

## 2011-06-22 NOTE — Telephone Encounter (Signed)
pt notified of stress test results and ok to have hernia surgery. I told pt that [facility] will call her to schedule a time for him to go over lovenox bridge for coumadin, pt gave verbal understanding, surgeon is Dr. Claud Kelp. [name]

## 2011-06-22 NOTE — Telephone Encounter (Signed)
Message copied by Tarri Fuller on Mon Jun 22, 2011  9:18 AM ------      Message from: [place], [place] T      Created: Sat Jun 20, 2011 12:14 PM       Please inform patient stress test normal.      Ok to proceed with hernia repair.      She needs to schedule Lovenox bridging with coumadin clinic.      Tereso Newcomer, PA-C  12:13 PM 06/20/2011

## 2011-06-23 ENCOUNTER — Telehealth (INDEPENDENT_AMBULATORY_CARE_PROVIDER_SITE_OTHER): Payer: Self-pay

## 2011-06-23 NOTE — Telephone Encounter (Signed)
LMOM for pt that clearance is in epic and surgery orders going to surgery schedulers to call pt. Msg on orders indicate for schedulers to call Dr Saunders Lions with DOS so lovenox can be coordinated.

## 2011-07-07 DIAGNOSIS — H538 Other visual disturbances: Secondary | ICD-10-CM | POA: Diagnosis not present

## 2011-07-07 DIAGNOSIS — H2510 Age-related nuclear cataract, unspecified eye: Secondary | ICD-10-CM | POA: Diagnosis not present

## 2011-07-07 DIAGNOSIS — E119 Type 2 diabetes mellitus without complications: Secondary | ICD-10-CM | POA: Diagnosis not present

## 2011-07-08 ENCOUNTER — Encounter (HOSPITAL_COMMUNITY): Payer: Self-pay

## 2011-07-08 ENCOUNTER — Encounter: Payer: Medicare Other | Admitting: Internal Medicine

## 2011-07-08 ENCOUNTER — Encounter (HOSPITAL_COMMUNITY): Payer: Self-pay | Admitting: Internal Medicine

## 2011-07-08 ENCOUNTER — Encounter (HOSPITAL_COMMUNITY)
Admission: RE | Admit: 2011-07-08 | Discharge: 2011-07-08 | Disposition: A | Discharge status: Home / Self Care | Payer: Medicare Other | Source: Ambulatory Visit | Attending: Ophthalmology | Admitting: Ophthalmology

## 2011-07-08 MED ORDER — CYCLOPENTOLATE-PHENYLEPHRINE 0.2-1 % OP SOLN
OPHTHALMIC | Status: AC
  Filled 2011-07-08: qty 2

## 2011-07-08 NOTE — Progress Notes (Signed)
07/08/11 1449  OBSTRUCTIVE SLEEP APNEA  Score 4 or greater  Updated health history;Results sent to PCP

## 2011-07-08 NOTE — Patient Instructions (Addendum)
Your procedure is scheduled on:  07/13/2011  Report to [facility] at    8:00  AM.  Call this number if you have problems the morning of surgery: [phone]   Remember:   Do not eat or drink :After Midnight.    Take these medicines the morning of surgery with A SIP OF WATER: Prinivil, Elavil, Lanoxin, atenolol, synthroid, Diltiazem   Do not wear jewelry, make-up or nail polish.  Do not wear lotions, powders, or perfumes. You may wear deodorant.  Do not shave 48 hours prior to surgery.  Do not bring valuables to the hospital.  Contacts, dentures or bridgework may not be worn into surgery.  Patients discharged the day of surgery will not be allowed to drive home.  Name and phone number of your driver:    Please read over the following fact sheets that you were given: Pain Booklet, Surgical Site Infection Prevention, Anesthesia Post-op Instructions and Care and Recovery After Surgery  Cataract Surgery  A cataract is a clouding of the lens of the eye. When a lens becomes cloudy, vision is reduced based on the degree and nature of the clouding. Surgery may be needed to improve vision. Surgery removes the cloudy lens and usually replaces it with a substitute lens (intraocular lens, IOL). LET YOUR EYE DOCTOR KNOW ABOUT:  Allergies to food or medicine.   Medicines taken including herbs, eyedrops, over-the-counter medicines, and creams.   Use of steroids (by mouth or creams).   Previous problems with anesthetics or numbing medicine.   History of bleeding problems or blood clots.   Previous surgery.   Other health problems, including diabetes and kidney problems.   Possibility of pregnancy, if this applies.  RISKS AND COMPLICATIONS  Infection.   Inflammation of the eyeball (endophthalmitis) that can spread to both eyes (sympathetic ophthalmia).   Poor wound healing.   If an IOL is inserted, it can later fall out of proper position. This is very uncommon.   Clouding of the part  of your eye that holds an IOL in place. This is called an "after-cataract." These are uncommon, but easily treated.  BEFORE THE PROCEDURE  Do not eat or drink anything except small amounts of water for 8 to 12 before your surgery, or as directed by your caregiver.   Unless you are told otherwise, continue any eyedrops you have been prescribed.   Talk to your primary caregiver about all other medicines that you take (both prescription and non-prescription). In some cases, you may need to stop or change medicines near the time of your surgery. This is most important if you are taking blood-thinning medicine.Do not stop medicines unless you are told to do so.   Arrange for someone to drive you to and from the procedure.   Do not put contact lenses in either eye on the day of your surgery.  PROCEDURE There is more than one method for safely removing a cataract. Your doctor can explain the differences and help determine which is best for you. Phacoemulsification surgery is the most common form of cataract surgery.  An injection is given behind the eye or eyedrops are given to make this a painless procedure.   A small cut (incision) is made on the edge of the clear, dome-shaped surface that covers the front of the eye (cornea).   A tiny probe is painlessly inserted into the eye. This device gives off ultrasound waves that soften and break up the cloudy center of the lens.  This makes it easier for the cloudy lens to be removed by suction.   An IOL may be implanted.   The normal lens of the eye is covered by a clear capsule. Part of that capsule is intentionally left in the eye to support the IOL.   Your surgeon may or may not use stitches to close the incision.  There are other forms of cataract surgery that require a larger incision and stiches to close the eye. This approach is taken in cases where the doctor feels that the cataract cannot be easily removed using phacoemulsification. AFTER  THE PROCEDURE  When an IOL is implanted, it does not need care. It becomes a permanent part of your eye and cannot be seen or felt.   Your doctor will schedule follow-up exams to check on your progress.   Review your other medicines with your doctor to see which can be resumed after surgery.   Use eyedrops or take medicine as prescribed by your doctor.  Document Released: 02/12/2011 Document Reviewed: 02/09/2011 [facility] Patient Information 2012 [place].  .Cataract Surgery Care After Refer to this sheet in the next few weeks. These instructions provide you with information on caring for yourself after your procedure. Your caregiver may also give you more specific instructions. Your treatment has been planned according to current medical practices, but problems sometimes occur. Call your caregiver if you have any problems or questions after your procedure.  HOME CARE INSTRUCTIONS   Avoid strenuous activities as directed by your caregiver.   Ask your caregiver when you can resume driving.   Use eyedrops or other medicines to help healing and control pressure inside your eye as directed by your caregiver.   Only take over-the-counter or prescription medicines for pain, discomfort, or fever as directed by your caregiver.   Do not to touch or rub your eyes.   You may be instructed to use a protective shield during the first few days and nights after surgery. If not, wear sunglasses to protect your eyes. This is to protect the eye from pressure or from being accidentally bumped.   Keep the area around your eye clean and dry. Avoid swimming or allowing water to hit you directly in the face while showering. Keep soap and shampoo out of your eyes.   Do not bend or lift heavy objects. Bending increases pressure in the eye. You can walk, climb stairs, and do light household chores.   Do not put a contact lens into the eye that had surgery until your caregiver says it is okay to do so.     Ask your doctor when you can return to work. This will depend on the kind of work that you do. If you work in a dusty environment, you may be advised to wear protective eyewear for a period of time.   Ask your caregiver when it will be safe to engage in sexual activity.   Continue with your regular eye exams as directed by your caregiver.  What to expect:  It is normal to feel itching and mild discomfort for a few days after cataract surgery. Some fluid discharge is also common, and your eye may be sensitive to light and touch.   After 1 to 2 days, even moderate discomfort should disappear. In most cases, healing will take about 6 weeks.   If you received an intraocular lens (IOL), you may notice that colors are very bright or have a blue tinge. Also, if you have been  in bright sunlight, everything may appear reddish for a few hours. If you see these color tinges, it is because your lens is clear and no longer cloudy. Within a few months after receiving an IOL, these extra colors should go away. When you have healed, you will probably need new glasses.  SEEK MEDICAL CARE IF:   You have increased bruising around your eye.   You have discomfort not helped by medicine.  SEEK IMMEDIATE MEDICAL CARE IF:   You have a fever.   You have a worsening or sudden vision loss.   You have redness, swelling, or increasing pain in the eye.   You have a thick discharge from the eye that had surgery.  MAKE SURE YOU:  Understand these instructions.   Will watch your condition.   Will get help right away if you are not doing well or get worse.  Document Released: 09/12/2004 Document Revised: 02/12/2011 Document Reviewed: 10/17/2010 [facility] Patient Information 2012 [place].

## 2011-07-13 ENCOUNTER — Encounter: Payer: Medicare Other | Admitting: Internal Medicine

## 2011-07-13 ENCOUNTER — Ambulatory Visit (HOSPITAL_COMMUNITY): Admission: RE | Admit: 2011-07-13 | Payer: Medicare Other | Source: Ambulatory Visit

## 2011-07-13 ENCOUNTER — Encounter (HOSPITAL_COMMUNITY): Payer: Self-pay | Admitting: Anesthesiology

## 2011-07-13 ENCOUNTER — Ambulatory Visit (HOSPITAL_COMMUNITY): Payer: Medicare Other | Admitting: Anesthesiology

## 2011-07-13 ENCOUNTER — Ambulatory Visit (HOSPITAL_COMMUNITY)
Admission: RE | Admit: 2011-07-13 | Discharge: 2011-07-13 | Disposition: A | Discharge status: Home / Self Care | Payer: Medicare Other | Source: Ambulatory Visit | Attending: Ophthalmology | Admitting: Ophthalmology

## 2011-07-13 ENCOUNTER — Encounter (HOSPITAL_COMMUNITY): Payer: Self-pay | Admitting: *Deleted

## 2011-07-13 ENCOUNTER — Encounter (HOSPITAL_COMMUNITY)
Admission: RE | Disposition: A | Discharge status: Home / Self Care | Payer: Self-pay | Source: Ambulatory Visit | Attending: Ophthalmology

## 2011-07-13 DIAGNOSIS — H2510 Age-related nuclear cataract, unspecified eye: Secondary | ICD-10-CM | POA: Diagnosis not present

## 2011-07-13 DIAGNOSIS — E119 Type 2 diabetes mellitus without complications: Secondary | ICD-10-CM | POA: Insufficient documentation

## 2011-07-13 DIAGNOSIS — Z79899 Other long term (current) drug therapy: Secondary | ICD-10-CM | POA: Diagnosis not present

## 2011-07-13 DIAGNOSIS — H538 Other visual disturbances: Secondary | ICD-10-CM | POA: Diagnosis not present

## 2011-07-13 DIAGNOSIS — Z01812 Encounter for preprocedural laboratory examination: Secondary | ICD-10-CM | POA: Diagnosis not present

## 2011-07-13 DIAGNOSIS — G4733 Obstructive sleep apnea (adult) (pediatric): Secondary | ICD-10-CM | POA: Insufficient documentation

## 2011-07-13 DIAGNOSIS — I10 Essential (primary) hypertension: Secondary | ICD-10-CM | POA: Insufficient documentation

## 2011-07-13 DIAGNOSIS — H269 Unspecified cataract: Secondary | ICD-10-CM | POA: Diagnosis not present

## 2011-07-13 HISTORY — PX: CATARACT EXTRACTION W/PHACO: SHX586

## 2011-07-13 LAB — GLUCOSE, CAPILLARY: Glucose-Capillary: 131 mg/dL — ABNORMAL HIGH (ref 70–99)

## 2011-07-13 SURGERY — PHACOEMULSIFICATION, CATARACT, WITH IOL INSERTION
Anesthesia: Monitor Anesthesia Care | Site: Eye | Laterality: Right | Wound class: Clean

## 2011-07-13 MED ORDER — BSS IO SOLN
INTRAOCULAR | Status: DC | PRN
  Administered 2011-07-13: 15 mL via INTRAOCULAR

## 2011-07-13 MED ORDER — CYCLOPENTOLATE-PHENYLEPHRINE 0.2-1 % OP SOLN
1.0000 [drp] | Freq: Once | OPHTHALMIC | Status: AC
  Administered 2011-07-13: 1 [drp] via OPHTHALMIC

## 2011-07-13 MED ORDER — GATIFLOXACIN 0.5 % OP SOLN
1.0000 [drp] | Freq: Once | OPHTHALMIC | Status: AC
  Administered 2011-07-13: 1 [drp] via OPHTHALMIC

## 2011-07-13 MED ORDER — FENTANYL CITRATE 0.05 MG/ML IJ SOLN: 25.0000 ug | INTRAMUSCULAR | Status: DC | PRN

## 2011-07-13 MED ORDER — MIDAZOLAM HCL 2 MG/2ML IJ SOLN
1.0000 mg | INTRAMUSCULAR | Status: DC | PRN
  Administered 2011-07-13: 2 mg via INTRAVENOUS

## 2011-07-13 MED ORDER — CEFAZOLIN SODIUM-DEXTROSE 2-3 GM-% IV SOLR: 2.0000 g | INTRAVENOUS | Status: DC

## 2011-07-13 MED ORDER — LIDOCAINE 3.5 % OP GEL OPTIME - NO CHARGE
OPHTHALMIC | Status: DC | PRN
  Administered 2011-07-13: 2 [drp] via OPHTHALMIC

## 2011-07-13 MED ORDER — HEPARIN SODIUM (PORCINE) 5000 UNIT/ML IJ SOLN: 5000.0000 [IU] | Freq: Once | INTRAMUSCULAR | Status: DC

## 2011-07-13 MED ORDER — LIDOCAINE HCL 3.5 % OP GEL
OPHTHALMIC | Status: AC
  Filled 2011-07-13: qty 5

## 2011-07-13 MED ORDER — EPINEPHRINE HCL 1 MG/ML IJ SOLN
INTRAMUSCULAR | Status: AC
  Filled 2011-07-13: qty 1

## 2011-07-13 MED ORDER — KETOROLAC TROMETHAMINE 0.5 % OP SOLN
1.0000 [drp] | Freq: Once | OPHTHALMIC | Status: AC
  Administered 2011-07-13: 1 [drp] via OPHTHALMIC

## 2011-07-13 MED ORDER — TETRACAINE HCL 0.5 % OP SOLN
1.0000 [drp] | Freq: Once | OPHTHALMIC | Status: AC
  Administered 2011-07-13: 1 [drp] via OPHTHALMIC

## 2011-07-13 MED ORDER — NA HYALUR & NA CHOND-NA HYALUR 0.55-0.5 ML IO KIT
PACK | INTRAOCULAR | Status: DC | PRN
  Administered 2011-07-13: 1 via OPHTHALMIC

## 2011-07-13 MED ORDER — ONDANSETRON HCL 4 MG/2ML IJ SOLN: 4.0000 mg | Freq: Once | INTRAMUSCULAR | Status: DC | PRN

## 2011-07-13 MED ORDER — EPINEPHRINE HCL 1 MG/ML IJ SOLN
INTRAOCULAR | Status: DC | PRN
  Administered 2011-07-13: 09:00:00

## 2011-07-13 MED ORDER — TETRACAINE 0.5 % OP SOLN OPTIME - NO CHARGE
OPHTHALMIC | Status: DC | PRN
  Administered 2011-07-13: 2 [drp] via OPHTHALMIC

## 2011-07-13 MED ORDER — TETRACAINE HCL 0.5 % OP SOLN
OPHTHALMIC | Status: AC
  Administered 2011-07-13: 1 [drp] via OPHTHALMIC
  Filled 2011-07-13: qty 2

## 2011-07-13 MED ORDER — CHLORHEXIDINE GLUCONATE 4 % EX LIQD: 1.0000 "application " | Freq: Once | CUTANEOUS | Status: DC

## 2011-07-13 MED ORDER — MIDAZOLAM HCL 2 MG/2ML IJ SOLN
INTRAMUSCULAR | Status: AC
  Filled 2011-07-13: qty 2

## 2011-07-13 MED ORDER — GATIFLOXACIN 0.5 % OP SOLN OPTIME - NO CHARGE
OPHTHALMIC | Status: DC | PRN
  Administered 2011-07-13: 1 [drp] via OPHTHALMIC

## 2011-07-13 MED ORDER — LACTATED RINGERS IV SOLN
INTRAVENOUS | Status: DC
  Administered 2011-07-13: 1000 mL via INTRAVENOUS

## 2011-07-13 SURGICAL SUPPLY — 28 items
CAPSULAR TENSION RING-AMO (OPHTHALMIC RELATED) IMPLANT
CLOTH BEACON ORANGE TIMEOUT ST (SAFETY) ×1 IMPLANT
GLOVE BIO SURGEON STRL SZ7.5 (GLOVE) IMPLANT
GLOVE BIOGEL M 6.5 STRL (GLOVE) IMPLANT
GLOVE BIOGEL PI IND STRL 6.5 (GLOVE) IMPLANT
GLOVE BIOGEL PI IND STRL 7.0 (GLOVE) IMPLANT
GLOVE BIOGEL PI INDICATOR 6.5 (GLOVE) ×1
GLOVE BIOGEL PI INDICATOR 7.0 (GLOVE)
GLOVE ECLIPSE 6.5 STRL STRAW (GLOVE) IMPLANT
GLOVE ECLIPSE 7.5 STRL STRAW (GLOVE) IMPLANT
GLOVE EXAM NITRILE LRG STRL (GLOVE) IMPLANT
GLOVE EXAM NITRILE MD LF STRL (GLOVE) ×1 IMPLANT
GLOVE INDICATOR 7.5 STRL GRN (GLOVE) ×1 IMPLANT
GLOVE SKINSENSE NS SZ6.5 (GLOVE)
GLOVE SKINSENSE NS SZ7.0 (GLOVE)
GLOVE SKINSENSE STRL SZ6.5 (GLOVE) IMPLANT
GLOVE SKINSENSE STRL SZ7.0 (GLOVE) IMPLANT
INST SET CATARACT ~~LOC~~ (KITS) ×2 IMPLANT
KIT VITRECTOMY (OPHTHALMIC RELATED) IMPLANT
PAD ARMBOARD 7.5X6 YLW CONV (MISCELLANEOUS) ×1 IMPLANT
PROC W NO LENS (INTRAOCULAR LENS)
PROC W SPEC LENS (INTRAOCULAR LENS)
PROCESS W NO LENS (INTRAOCULAR LENS) IMPLANT
PROCESS W SPEC LENS (INTRAOCULAR LENS) IMPLANT
RING MALYGIN (MISCELLANEOUS) IMPLANT
SIGHTPATH CAT PROC W REG LENS (Ophthalmic Related) ×2 IMPLANT
VISCOELASTIC ADDITIONAL (OPHTHALMIC RELATED) IMPLANT
WATER STERILE IRR 250ML POUR (IV SOLUTION) ×1 IMPLANT

## 2011-07-13 NOTE — H&P (Signed)
I have reviewed the pre printed H&P, the patient was re-examined, and I have identified no significant interval changes in the patient's medical condition.  There is no change in the plan of care since the history and physical of record. 

## 2011-07-13 NOTE — Transfer of Care (Signed)
Immediate Anesthesia Transfer of Care Note  Patient: [name]  Procedure(s) Performed: Procedure(s) (LRB): CATARACT EXTRACTION PHACO AND INTRAOCULAR LENS PLACEMENT (IOC) (Right)  Patient Location: PACU  Anesthesia Type: MAC  Level of Consciousness: awake, alert  and oriented  Airway & Oxygen Therapy: Patient Spontanous Breathing  Post-op Assessment: Report given to PACU RN  Post vital signs: Reviewed and stable  Complications: No apparent anesthesia complications

## 2011-07-13 NOTE — Brief Op Note (Signed)
07/13/2011  10:41 AM  PATIENT:  [name]  61 y.o. female  PRE-OPERATIVE DIAGNOSIS:  nuclear cataract right eye  POST-OPERATIVE DIAGNOSIS:  nuclear cataract right eye  PROCEDURE:  Procedure(s): CATARACT EXTRACTION PHACO AND INTRAOCULAR LENS PLACEMENT (IOC)  SURGEON:  Surgeon(s): Susa Simmonds, MD  ASSISTANTS: Trenton Founds, CST   ANESTHESIA STAFF: Moshe Salisbury, CRNA - CRNA Laurene Footman, MD - Anesthesiologist  ANESTHESIA:   topical and MAC  REQUESTED LENS POWER: 26.5  LENS IMPLANT INFORMATION:  Alcon [id number] s/n [id number]  CUMULATIVE DISSIPATED ENERGY:1.65  INDICATIONS:see scanned H&P  OP FINDINGS:dense NS  COMPLICATIONS:None  DICTATION #: none  PLAN OF CARE: see H&P  PATIENT DISPOSITION:  Short Stay

## 2011-07-13 NOTE — Anesthesia Postprocedure Evaluation (Signed)
  Anesthesia Post-op Note  Patient: [name]  Procedure(s) Performed: Procedure(s) (LRB): CATARACT EXTRACTION PHACO AND INTRAOCULAR LENS PLACEMENT (IOC) (Right)  Patient Location: PACU  Anesthesia Type: MAC  Level of Consciousness: awake, alert  and oriented  Airway and Oxygen Therapy: Patient Spontanous Breathing  Post-op Pain: none  Post-op Assessment: Post-op Vital signs reviewed, Patient's Cardiovascular Status Stable, Respiratory Function Stable, Patent Airway and No signs of Nausea or vomiting  Post-op Vital Signs: Reviewed and stable  Complications: No apparent anesthesia complications

## 2011-07-13 NOTE — Anesthesia Preprocedure Evaluation (Signed)
Anesthesia Evaluation  Patient identified by MRN, date of birth, ID band Patient awake    Reviewed: Allergy & Precautions, H&P , NPO status , Patient's Chart, lab work & pertinent test results  History of Anesthesia Complications Negative for: history of anesthetic complications  Airway Mallampati: II  Neck ROM: Full    Dental  (+) Teeth Intact   Pulmonary sleep apnea and Continuous Positive Airway Pressure Ventilation ,    Pulmonary exam normal       Cardiovascular hypertension, + dysrhythmias Atrial Fibrillation Rhythm:Irregular Rate:Normal     Neuro/Psych Depression TIA   GI/Hepatic GERD-  Medicated and Controlled,  Endo/Other  Diabetes mellitus-, Well Controlled, Type 2, Oral Hypoglycemic AgentsHypothyroidism   Renal/GU      Musculoskeletal  (+) Fibromyalgia -  Abdominal   Peds  Hematology   Anesthesia Other Findings   Reproductive/Obstetrics                           Anesthesia Physical Anesthesia Plan  ASA: III  Anesthesia Plan: MAC   Post-op Pain Management:    Induction: Intravenous  Airway Management Planned: Nasal Cannula  Additional Equipment:   Intra-op Plan:   Post-operative Plan:   Informed Consent: I have reviewed the patients History and Physical, chart, labs and discussed the procedure including the risks, benefits and alternatives for the proposed anesthesia with the patient or authorized representative who has indicated his/her understanding and acceptance.     Plan Discussed with:   Anesthesia Plan Comments:         Anesthesia Quick Evaluation

## 2011-07-13 NOTE — Op Note (Signed)
See scanned op note dated today  

## 2011-07-15 ENCOUNTER — Encounter (HOSPITAL_COMMUNITY): Payer: Self-pay | Admitting: Ophthalmology

## 2011-07-17 ENCOUNTER — Encounter (HOSPITAL_COMMUNITY): Payer: Self-pay | Admitting: Pharmacy Technician

## 2011-07-20 ENCOUNTER — Ambulatory Visit (HOSPITAL_COMMUNITY)
Admission: RE | Admit: 2011-07-20 | Discharge: 2011-07-20 | Disposition: A | Discharge status: Home / Self Care | Payer: Medicare Other | Source: Ambulatory Visit | Attending: Anesthesiology | Admitting: Anesthesiology

## 2011-07-20 ENCOUNTER — Other Ambulatory Visit (INDEPENDENT_AMBULATORY_CARE_PROVIDER_SITE_OTHER): Payer: Self-pay | Admitting: General Surgery

## 2011-07-20 ENCOUNTER — Encounter (HOSPITAL_COMMUNITY)
Admission: RE | Admit: 2011-07-20 | Discharge: 2011-07-20 | Disposition: A | Discharge status: Home / Self Care | Payer: Medicare Other | Source: Ambulatory Visit | Attending: General Surgery | Admitting: General Surgery

## 2011-07-20 ENCOUNTER — Encounter (HOSPITAL_COMMUNITY): Payer: Self-pay

## 2011-07-20 ENCOUNTER — Ambulatory Visit (INDEPENDENT_AMBULATORY_CARE_PROVIDER_SITE_OTHER): Payer: Medicare Other | Admitting: *Deleted

## 2011-07-20 ENCOUNTER — Telehealth (INDEPENDENT_AMBULATORY_CARE_PROVIDER_SITE_OTHER): Payer: Self-pay | Admitting: General Surgery

## 2011-07-20 DIAGNOSIS — Z01812 Encounter for preprocedural laboratory examination: Secondary | ICD-10-CM | POA: Diagnosis not present

## 2011-07-20 DIAGNOSIS — J984 Other disorders of lung: Secondary | ICD-10-CM | POA: Diagnosis not present

## 2011-07-20 DIAGNOSIS — G459 Transient cerebral ischemic attack, unspecified: Secondary | ICD-10-CM | POA: Diagnosis not present

## 2011-07-20 DIAGNOSIS — I4891 Unspecified atrial fibrillation: Secondary | ICD-10-CM | POA: Diagnosis not present

## 2011-07-20 DIAGNOSIS — K439 Ventral hernia without obstruction or gangrene: Secondary | ICD-10-CM | POA: Insufficient documentation

## 2011-07-20 DIAGNOSIS — Z7901 Long term (current) use of anticoagulants: Secondary | ICD-10-CM

## 2011-07-20 DIAGNOSIS — Z01811 Encounter for preprocedural respiratory examination: Secondary | ICD-10-CM | POA: Diagnosis not present

## 2011-07-20 DIAGNOSIS — Z01818 Encounter for other preprocedural examination: Secondary | ICD-10-CM | POA: Insufficient documentation

## 2011-07-20 LAB — COMPREHENSIVE METABOLIC PANEL
Albumin: 3.5 g/dL (ref 3.5–5.2)
Alkaline Phosphatase: 76 U/L (ref 39–117)
BUN: 8 mg/dL (ref 6–23)
Creatinine, Ser: 0.53 mg/dL (ref 0.50–1.10)
GFR calc Af Amer: >90 mL/min (ref >90)
Glucose, Bld: 204 mg/dL — ABNORMAL HIGH (ref 70–99)
Potassium: 4.2 mEq/L (ref 3.5–5.1)
Total Protein: 6.7 g/dL (ref 6.0–8.3)

## 2011-07-20 LAB — URINALYSIS, ROUTINE W REFLEX MICROSCOPIC
Glucose, UA: 250 mg/dL — AB
Hgb urine dipstick: NEGATIVE
Ketones, ur: NEGATIVE mg/dL
Leukocytes, UA: NEGATIVE
Nitrite: NEGATIVE
Protein, ur: NEGATIVE mg/dL
Specific Gravity, Urine: 1.013 (ref 1.005–1.030)
Urobilinogen, UA: 0.2 mg/dL (ref 0.0–1.0)
pH: 5.5 (ref 5.0–8.0)

## 2011-07-20 LAB — CBC
HCT: 39.8 % (ref 36.0–46.0)
Hemoglobin: 13.8 g/dL (ref 12.0–15.0)
MCH: 32.2 pg (ref 26.0–34.0)
MCHC: 34.7 g/dL (ref 30.0–36.0)
MCV: 92.8 fL (ref 78.0–100.0)
Platelets: 230 10*3/uL (ref 150–400)
RBC: 4.29 MIL/uL (ref 3.87–5.11)
RDW: 12.6 % (ref 11.5–15.5)
WBC: 9.2 10*3/uL (ref 4.0–10.5)

## 2011-07-20 LAB — SURGICAL PCR SCREEN: Staphylococcus aureus: NEGATIVE

## 2011-07-20 LAB — PROTIME-INR: Prothrombin Time: 25.5 seconds — ABNORMAL HIGH (ref 11.6–15.2)

## 2011-07-20 LAB — APTT: aPTT: 36 seconds (ref 24–37)

## 2011-07-20 MED ORDER — CHLORHEXIDINE GLUCONATE 4 % EX LIQD: 1.0000 "application " | Freq: Once | CUTANEOUS | Status: DC

## 2011-07-20 MED ORDER — ENOXAPARIN SODIUM 120 MG/0.8ML ~~LOC~~ SOLN: 120.0000 mg | Freq: Every day | SUBCUTANEOUS | Status: DC

## 2011-07-20 NOTE — Progress Notes (Signed)
Abnormal INR and U/A noted.  Chart given to a. Zelenak, PA-C, to review.//L. Darrall Strey,RN

## 2011-07-20 NOTE — Patient Instructions (Addendum)
For ventral hernia repair 07/29/2011, lovenox bridging. 07/23/2011 take your last dose of coumadin 5/17 no coumadin or lovenox 5/18 start lovenox 120 mg daily 5/19 lovenox 120 mg daily 5/20 lovenox 120 mg daily 5/21 lovenox 120 mg last dose  5/22 day of procedure no coumadin or lovenox  Post op bridging instructions: First 2 days home take an extra 1/2 tablet of coumadin Continue lovenox until you come back to coumadin clinic goal of INR >2.0

## 2011-07-20 NOTE — Telephone Encounter (Signed)
[name], [facility] pre-admissions, needs orders put in for [employer].  Orders in previously were cancelled on 07/13/11.  She is coming in today for her pre-admission appt.  [name] can be reached at [phone].

## 2011-07-20 NOTE — Progress Notes (Signed)
Patient here for PAT. Reports going to Coumadin clinic this PM and being instructed on Lovenox therapy per Dr.'s orders. Pt reports having last Sleep study >10years ago at [facility]. She does not use CPAP.  Her Cardiologist is Dr. Antoine Poche who has cleared her for surgery as of 06/10/11 after Lexiscan stress test( in [facility]). No orders noted prior to PAT but shown to be placed in EPIC at end of visit- office notified at beginning.//L. Shameka Aggarwal,RN

## 2011-07-20 NOTE — Pre-Procedure Instructions (Addendum)
20 [name]  07/20/2011   Your procedure is scheduled on:  Wednesday, May 22nd@  8:30AM.  Report to [facility] at 6:30 AM.  Call this number if you have problems the morning of surgery: [phone]   Remember:   Do not eat food:After Midnight.  May have clear liquids: up to 4 Hours before arrival( nothing after 2:30AM).  Clear liquids include soda, tea, black coffee, apple or grape juice, broth.  Take these medicines the morning of surgery with A SIP OF WATER: Prozac, Albuterol, Atenolol, Digoxin, Diltiazem, Neurontin, Levothyroxine, and pain med(Hydrocodone-Acetaminophen or Flexeril).  * Discontinue Aspirin, Coumadin, Plavix , Effient, and all herbal medicines 7 days prior to surgery.   Do not wear jewelry, make-up or nail polish.  Do not wear lotions, powders, or perfumes. You may wear deodorant.  Do not shave 48 hours prior to surgery. Men may shave face and neck.  Do not bring valuables to the hospital.  Contacts, dentures or bridgework may not be worn into surgery.  Leave suitcase in the car. After surgery it may be brought to your room.  For patients admitted to the hospital, checkout time is 11:00 AM the day of discharge.   Patients discharged the day of surgery will not be allowed to drive home.  Name and phone number of your driver: [name], son  Special Instructions: CHG Shower Use Special Wash: 1/2 bottle night before surgery and 1/2 bottle morning of surgery.   Please read over the following fact sheets that you were given: Pain Booklet, Coughing and Deep Breathing and Surgical Site Infection Prevention

## 2011-07-21 NOTE — Consult Note (Signed)
Anesthesia Chart Review:  Patient is a 61 year old female scheduled for a laparoscopic versus open repair of an ventral hernia on 07/29/11.  History includes non-smoker, depression, obesity, fibromyalgia, GERD, TIA, fatty liver, afib, HTN, DM2, seizures, OSA, CHF, Sarcoidosis, OA, hypothyroidism, HLD, skin cancer.  Cardiologist is Dr. Antoine Poche.  He was seen by [facility] PA Tereso Newcomer for clearance on 06/10/11.  Stress test and Lovenox bridging while off Coumadin was recommended.   On 06/19/11 she had a normal stress nuclear study. LV Ejection Fraction: 76%. LV Wall Motion: NL LV Function; NL Wall Motion, and was ultimately cleared.  She had multiple EKGs on 06/17/11 that showed afib, rate of 102-114 with lateral ST/T wave abnormality with RVR.  Her HR was 96 at PAT on 07/20/11.  Echo on 06/07/07 showed: - Overall left ventricular systolic function was normal. Left ventricular ejection fraction was estimated , range being 55 % to 65 %. Although no diagnostic left ventricular regional wall motion abnormality was identified, this possibility cannot be completely excluded on the basis of this study. Left ventricular diastolic function parameters were normal. - The left atrium was mild to moderately dilated. - The right atrium was mildly dilated.  CXR on 07/20/11 showed no active cardiopulmonary process.  Labs noted.  CBG and PT/PTT on arrival.  Shonna Chock, PA-C

## 2011-07-23 ENCOUNTER — Ambulatory Visit (INDEPENDENT_AMBULATORY_CARE_PROVIDER_SITE_OTHER): Payer: Medicare Other | Admitting: Internal Medicine

## 2011-07-23 ENCOUNTER — Encounter: Payer: Self-pay | Admitting: Internal Medicine

## 2011-07-23 VITALS — BP 133/74 | HR 83 | Temp 98.0°F | Ht 63.0 in | Wt 188.4 lb

## 2011-07-23 DIAGNOSIS — I10 Essential (primary) hypertension: Secondary | ICD-10-CM | POA: Diagnosis not present

## 2011-07-23 DIAGNOSIS — E119 Type 2 diabetes mellitus without complications: Secondary | ICD-10-CM

## 2011-07-23 DIAGNOSIS — T148XXA Other injury of unspecified body region, initial encounter: Secondary | ICD-10-CM

## 2011-07-23 DIAGNOSIS — K449 Diaphragmatic hernia without obstruction or gangrene: Secondary | ICD-10-CM

## 2011-07-23 LAB — GLUCOSE, CAPILLARY: Glucose-Capillary: 192 mg/dL — ABNORMAL HIGH (ref 70–99)

## 2011-07-23 MED ORDER — ALPRAZOLAM 0.5 MG PO TABS: 0.5000 mg | ORAL_TABLET | Freq: Every evening | ORAL | Status: DC | PRN

## 2011-07-23 NOTE — Patient Instructions (Signed)
Please follow up in 3 months with your new primary care physician. Please take xanax at bedtime before your surgery if needed.  Please follow instructions regarding lovenox injections.  Please take medications as directed.

## 2011-07-23 NOTE — Assessment & Plan Note (Signed)
Last A1C 6.5. Good control, CBGs usually run in the 120-170s. On oral agents only, no hypoglycemic episodes. Will urine microalb/cr ratio today.  Other labs up to date.

## 2011-07-23 NOTE — Progress Notes (Signed)
Patient ID: [name], female   DOB: 1950/12/01, 61 y.o.   MRN: [id number] Subjective:     Patient ID: [name], female   DOB: Jul 11, 1950, 61 y.o.   MRN: [id number]  HPI  [name] is a 61 year old woman with pmh significant for Hypothyroidism, DM, HTN, Afib, and GERD who presents for the following:   1.) Abdominal pain - epigastric pain, radiates to back. History of hiatal hernia surgery 22 years ago, and patient feels the pain is similar to that. No associated nausea or vomiting, no hx of pancreatitis, and not related with food ingestion. Saw Dr. Derrell Lolling and plan is for another repair 07/29/2011.  Patient has no other complaints or concerns today. She denies chest pain, cough, sob, headache, N/V, changes in abdominal and urinary character.   Review of Systems  All other systems reviewed and are negative.       Objective:   Physical Exam  Constitutional: She appears well-developed.  HENT:  Head: Normocephalic and atraumatic.  Eyes: EOM are normal.  Neck: Normal range of motion. Neck supple.  Cardiovascular: Normal rate and normal heart sounds.   Pulmonary/Chest: Effort normal and breath sounds normal.  Abdominal: Bowel sounds are normal. There is tenderness.         Incision located above umbilicus from prior hernia repair surgery. Lateral to the incision on left side is a bulge that is tender to palpation. No erythema or warmth appreciated. No obvious masses palpated.

## 2011-07-23 NOTE — Assessment & Plan Note (Signed)
PLT and INR done 05/13 within normal limits. Small bruise on right shoulder. No other evidence of bleeding. Lab Results  Component Value Date   PLT 230 07/20/2011   Lab Results  Component Value Date   INR 2.7 07/20/2011   INR 2.28* 07/20/2011   INR 3.0 06/10/2011   PROTIME 18.8 08/20/2008

## 2011-07-23 NOTE — Assessment & Plan Note (Signed)
Lab Results  Component Value Date   NA 138 07/20/2011   K 4.2 07/20/2011   CL 101 07/20/2011   CO2 30 07/20/2011   BUN 8 07/20/2011   CREATININE 0.53 07/20/2011   CREATININE 0.75 05/08/2011    BP Readings from Last 3 Encounters:  07/23/11 133/74  07/20/11 160/81  07/13/11 117/68    Assessment: Hypertension control:  controlled  Progress toward goals:  at goal Barriers to meeting goals:  no barriers identified  Plan: Hypertension treatment:  continue current medications

## 2011-07-24 LAB — MICROALBUMIN / CREATININE URINE RATIO
Creatinine, Urine: 30.4 mg/dL
Microalb Creat Ratio: 16.4 mg/g (ref 0.0–30.0)
Microalb, Ur: 0.5 mg/dL (ref 0.00–1.89)

## 2011-07-27 NOTE — H&P (Signed)
[name]    MRN: [id number]   Description: 61 year old female  Provider: Ernestene Mention, MD  Department: [facility]      Diagnoses     Incisional hernia   - Primary    553.21      Vitals    BP Pulse Temp(Src) Resp Ht Wt    128/72  70  97.8 F (36.6 C) (Temporal)  18  5\' 3"  (1.6 m)  191 lb 6.4 oz (86.818 kg)     BMI LMP - 33.90 kg/m2  06/03/1978              History and Physical   Ernestene Mention, MD Patient ID: [name], female   DOB: 14-Dec-1950, 61 y.o.   MRN: [id number]         HPI [name] [name] is a 61 y.o. female.  She is referred by Dr. Baltazar Apo at [facility] internal medicine clinic for evaluation of a ventral hernia. Dr. Antoine Poche is her cardiologist.  This patient had an open hiatal hernia and antireflux repair by Dr. Theron Arista young 23 years ago. She does fairly well from that but occasionally has to have esophageal dilatation.  She's noticed a painful bulge slightly to the left of the midline midway between her xiphoid and her umbilicus. She's not had any nausea or vomiting. She also wanted me to check a small nodule that has been on her left arm triceps area for about one week. She denies trauma but does states it is bruised.  A CT scan has been done on March 1. This shows a small ventral hernia in the upper midline containing fat. There are also bilateral fat-containing inguinal hernias.  Past surgical history includes a hiatal hernia repair, abdominal hysterectomy, and some type of surgery through an infraumbilical incision. She be she also may have had a femoral hernia repair.  Significant comorbidities include atrial fibrillation on Coumadin, TIA, GERD, hypertension, depression, obesity, hyperlipidemia, diabetes. She is on numerous medications.  She is relatively comfortable today and is exploring her options for management of her ventral incisional hernia.      Past Medical History   Diagnosis  Date   .  Dysphagia     .  Depression     .   Chest pain     .  Obesity     .  Skin neoplasm     .  Bunion     .  Carotid stenosis     .  Fibromyalgia     .  Internal hemorrhoid     .  GERD (gastroesophageal reflux disease)     .  Chronic gastritis     .  Transaminase or LDH elevation     .  Mild cognitive impairment     .  Hyperlipidemia     .  TIA (transient ischemic attack)     .  Fatty liver     .  Lumbar back pain     .  Diabetic peripheral neuropathy     .  Diverticulosis     .  Esophageal stricture     .  Atrial fibrillation     .  Osteoarthritis     .  Hypertension     .  Hypothyroidism     .  Diabetes mellitus     .  History of hiatal hernia     .  Cholecystitis         s/p cholecystectomy   .  OSA (obstructive sleep apnea)     .  Sarcoidosis     .  Breast mass, right         benign   .  Fibromyalgia     .  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   Marland Kitchen  CHF (congestive heart failure)     .  Cancer         skin   .  Trouble swallowing     .  Cough         Past Surgical History   Procedure  Date   .  Vaginal hysterectomy     .  Cholecystectomy     .  Hiatal hernia repair     .  Esophageal dilation     .  Excision of milk duct         right breast   .  Hernia surgery         femoral   .  Right foot surgery     .  Left foot surgery     .  Cataract extraction w/phaco  10/27/2010       Procedure: CATARACT EXTRACTION PHACO AND INTRAOCULAR LENS PLACEMENT (IOC);  Surgeon: Susa Simmonds;  Location: [facility];  Service: Ophthalmology;  Laterality: Left;  CDE- 1.78   .  Abdominal hysterectomy         Family History   Problem  Relation  Age of Onset   .  Crohn's disease  Mother     .  Colitis  Mother         Crohns   .  Cancer  Father     .  Diabetes  Father     .  Heart disease  Father     .  Melanoma  Father     .  Diabetes  Sister     .  Depression  Maternal Uncle     .  Dementia  Maternal Uncle     .  Anesthesia problems  Neg Hx     .  Hypotension  Neg Hx     .  Malignant  hyperthermia  Neg Hx     .  Pseudochol deficiency  Neg Hx        Social History History   Substance Use Topics   .  Smoking status:  Never Smoker    .  Smokeless tobacco:  Never Used   .  Alcohol Use:  No       Allergies   Allergen  Reactions   .  Latex  Other (See Comments)       Blisters where touched or applied   .  Adhesive (Tape)  Other (See Comments)       Will blister skin where applied - do not use BAND-AIDS.   Marland Kitchen  Gemfibrozil  Swelling       REACTION: Angioedema   .  Codeine  Hives       Will spread in patches all over the body.   .  Penicillins  Hives       Will spread in patches all over the body.       Current Outpatient Prescriptions   Medication  Sig  Dispense  Refill   .  albuterol (PROVENTIL HFA;VENTOLIN HFA) 108 (90 BASE) MCG/ACT inhaler  Inhale 2 puffs into the lungs every 6 (six) hours as needed. For shortness of breath  3 Inhaler   3   .  amitriptyline (ELAVIL) 50 MG tablet  Take 1 tablet (50 mg total) by mouth at bedtime.   90 tablet   6   .  atenolol (TENORMIN) 25 MG tablet  Take 1 tablet (25 mg total) by mouth 2 (two) times daily.   180 tablet   3   .  B Complex-Biotin-FA (SUPER B-50 B COMPLEX PO)  Take 1 tablet by mouth 2 (two) times daily.           .  Calcium Carbonate-Vitamin D (CALCIUM 600+D) 600-400 MG-UNIT per tablet  Take 1 tablet by mouth 2 (two) times daily with a meal.           .  cyclobenzaprine (FLEXERIL) 5 MG tablet  Take 1 tablet (5 mg total) by mouth 3 (three) times daily as needed. For fibromyalgia   90 tablet   3   .  digoxin (LANOXIN) 0.25 MG tablet  Take 1 tablet (250 mcg total) by mouth daily.   90 tablet   3   .  diltiazem (DILACOR XR) 180 MG 24 hr capsule  Take 1 capsule (180 mg total) by mouth daily.   90 capsule   3   .  fenofibrate (TRICOR) 145 MG tablet  Take 1 tablet (145 mg total) by mouth daily.   90 tablet   3   .  fish oil-omega-3 fatty acids 1000 MG capsule  Take 2 g by mouth 2 (two) times daily.          Marland Kitchen  FLUoxetine  (PROZAC) 20 MG capsule  Take 1 capsule (20 mg total) by mouth daily.   90 capsule   3   .  furosemide (LASIX) 40 MG tablet  Take 1 tablet (40 mg total) by mouth daily.   90 tablet   3   .  gabapentin (NEURONTIN) 100 MG capsule  Take 1 capsule (100 mg total) by mouth 3 (three) times daily.   90 capsule   6   .  glipiZIDE (GLUCOTROL) 10 MG tablet  Take 1 tablet (10 mg total) by mouth 2 (two) times daily before a meal.   180 tablet   3   .  HYDROcodone-acetaminophen (VICODIN) 5-500 MG per tablet  Take 1 tablet by mouth every 12 (twelve) hours as needed. For pain   90 tablet   1   .  levothyroxine (SYNTHROID, LEVOTHROID) 75 MCG tablet  Take 1 tablet (75 mcg total) by mouth daily.   90 tablet   3   .  lisinopril (PRINIVIL,ZESTRIL) 10 MG tablet  Take 1 tablet (10 mg total) by mouth daily.   90 tablet   3   .  metFORMIN (GLUCOPHAGE) 1000 MG tablet  Take 1 tablet (1,000 mg total) by mouth 2 (two) times daily with a meal.   180 tablet   3   .  Multiple Vitamin (MULTIVITAMIN) capsule  Take 1 capsule by mouth daily.          .  niacin (NIASPAN) 500 MG CR tablet  Take 2 tablets (1,000 mg total) by mouth 2 (two) times daily.   360 tablet   3   .  omeprazole (PRILOSEC) 20 MG capsule  Take 2 capsules (40 mg total) by mouth as needed.   90 capsule   3   .  potassium chloride (K-DUR) 10 MEQ tablet  Take 1 tablet (10 mEq total) by mouth daily.   90 tablet  3   .  pravastatin (PRAVACHOL) 80 MG tablet  Take 1 tablet (80 mg total) by mouth daily.   90 tablet   3   .  warfarin (COUMADIN) 5 MG tablet  Take 1 tablet (5 mg total) by mouth daily.   90 tablet   3      Review of Systems   Constitutional: Negative for fever, chills and unexpected weight change.  HENT: Negative for hearing loss, congestion, sore throat, trouble swallowing and voice change.   Eyes: Negative for visual disturbance.  Respiratory: Negative for cough and wheezing.   Cardiovascular: Negative for chest pain, palpitations and leg swelling.    Gastrointestinal: Positive for abdominal pain. Negative for nausea, vomiting, diarrhea, constipation, blood in stool, abdominal distention and anal bleeding.  Genitourinary: Negative for hematuria, vaginal bleeding and difficulty urinating.  Musculoskeletal: Negative for arthralgias.  Skin: Negative for rash and wound.  Neurological: Negative for seizures, syncope and headaches.  Hematological: Negative for adenopathy. Does not bruise/bleed easily.  Psychiatric/Behavioral: Negative for confusion.   Blood pressure 128/72, pulse 70, temperature 97.8 F (36.6 C), temperature source Temporal, resp. rate 18, height 5\' 3"  (1.6 m), weight 191 lb 6.4 oz (86.818 kg), last menstrual period 06/03/1978.  Physical Exam   Constitutional: She is oriented to person, place, and time. She appears well-developed and well-nourished. No distress.  HENT:   Head: Normocephalic and atraumatic.   Nose: Nose normal.   Mouth/Throat: No oropharyngeal exudate.  Eyes: Conjunctivae and EOM are normal. Pupils are equal, round, and reactive to light. Left eye exhibits no discharge. No scleral icterus.  Neck: Neck supple. No JVD present. No tracheal deviation present. No thyromegaly present.  Cardiovascular: Normal rate, regular rhythm, normal heart sounds and intact distal pulses.    No murmur heard. Pulmonary/Chest: Effort normal and breath sounds normal. No respiratory distress. She has no wheezes. She has no rales. She exhibits no tenderness.  Abdominal: Soft. Bowel sounds are normal. She exhibits no distension and no mass. There is no tenderness. There is no rebound and no guarding.     Long midline scar goes around umbilicus on right.    Mid epigastric hernia slightly to left of midline, seemingly mostly reducible, defect probably less than 5 cm.     Small infraumbilical scar, no obvious hernia.      Healed pfanensteil scar.    Very small bilateral inguinal hernias, reducible and non tender.   Musculoskeletal: She  exhibits no edema and no tenderness.  Lymphadenopathy:    She has no cervical adenopathy.  Neurological: She is alert and oriented to person, place, and time. She exhibits normal muscle tone. Coordination normal.  Skin: Skin is warm. No rash noted. She is not diaphoretic. No erythema. No pallor.  Psychiatric: She has a normal mood and affect. Her behavior is normal. Judgment and thought content normal.    Data Reviewed I have reviewed her CT scan. I reviewed her office notes from [place]. I reviewed our old records.   Assessment   Symptomatic ventral incisional hernia, in midepigastrium. This should be amenable to laparoscopic repair with mesh, if her adhesions are not too bad. Otherwise it may have to be converted to an open procedure.  Bilateral inguinal hernias. These are relatively asymptomatic at this time and do not warrant immediate repair. They may require repair in the future. I do not think they should be repaired at the same time as the ventral hernia.  Small hematoma left upper extremity, triceps area. Suspect  minor trauma and Coumadin as the cause. Suspect this will resolve in 6 weeks or so.  Chronic atrial fibrillation on Coumadin  Non-insulin-dependent diabetes mellitus  Mild obesity  Hypertension  Anxiety and depression  TIAs  History of open hiatal hernia repair    Plan  The patient strongly desires repair of the ventral incisional hernia, and so we will schedule her for laparoscopic repair of the ventral incisional hernia with mesh, possible open.  Cardiac clearance with Dr. Antoine Poche;    we will ask if we can stop the Coumadin 5 days preop  I discussed the indications and details of surgery with her. Risks and complications have been outlined, including but not limited to bleeding, infection, injury to the intestine, recurrence of the hernia, conversion to open laparotomy, cardiac, pulmonary, and thromboembolic problems. She understands these issues. Her  questions were answered. She agrees with this plan.       Angelia Mould. Derrell Lolling, M.D., [facility] General and Minimally invasive Surgery Breast and Colorectal Surgery Office:   [phone] Pager:   [phone]

## 2011-07-28 MED ORDER — HEPARIN SODIUM (PORCINE) 5000 UNIT/ML IJ SOLN
5000.0000 [IU] | Freq: Once | INTRAMUSCULAR | Status: AC
  Administered 2011-07-29: 5000 [IU] via SUBCUTANEOUS
  Filled 2011-07-28 (×2): qty 1

## 2011-07-29 ENCOUNTER — Encounter (HOSPITAL_COMMUNITY): Payer: Self-pay | Admitting: *Deleted

## 2011-07-29 ENCOUNTER — Ambulatory Visit (HOSPITAL_COMMUNITY): Payer: Medicare Other | Admitting: Vascular Surgery

## 2011-07-29 ENCOUNTER — Encounter (HOSPITAL_COMMUNITY)
Admission: RE | Disposition: A | Discharge status: Home Health | Payer: Self-pay | Source: Ambulatory Visit | Attending: General Surgery

## 2011-07-29 ENCOUNTER — Inpatient Hospital Stay (HOSPITAL_COMMUNITY)
Admission: RE | Admit: 2011-07-29 | Discharge: 2011-08-17 | DRG: 336 | Disposition: A | Discharge status: Home Health | Payer: Medicare Other | Source: Ambulatory Visit | Attending: General Surgery | Admitting: General Surgery

## 2011-07-29 ENCOUNTER — Encounter (HOSPITAL_COMMUNITY): Payer: Self-pay | Admitting: Vascular Surgery

## 2011-07-29 DIAGNOSIS — R10819 Abdominal tenderness, unspecified site: Secondary | ICD-10-CM | POA: Diagnosis not present

## 2011-07-29 DIAGNOSIS — E669 Obesity, unspecified: Secondary | ICD-10-CM | POA: Diagnosis present

## 2011-07-29 DIAGNOSIS — D72829 Elevated white blood cell count, unspecified: Secondary | ICD-10-CM | POA: Diagnosis present

## 2011-07-29 DIAGNOSIS — K219 Gastro-esophageal reflux disease without esophagitis: Secondary | ICD-10-CM | POA: Diagnosis present

## 2011-07-29 DIAGNOSIS — R079 Chest pain, unspecified: Secondary | ICD-10-CM | POA: Diagnosis not present

## 2011-07-29 DIAGNOSIS — E119 Type 2 diabetes mellitus without complications: Secondary | ICD-10-CM

## 2011-07-29 DIAGNOSIS — K660 Peritoneal adhesions (postprocedural) (postinfection): Secondary | ICD-10-CM | POA: Diagnosis present

## 2011-07-29 DIAGNOSIS — K439 Ventral hernia without obstruction or gangrene: Secondary | ICD-10-CM

## 2011-07-29 DIAGNOSIS — R933 Abnormal findings on diagnostic imaging of other parts of digestive tract: Secondary | ICD-10-CM | POA: Diagnosis not present

## 2011-07-29 DIAGNOSIS — K3189 Other diseases of stomach and duodenum: Secondary | ICD-10-CM | POA: Diagnosis not present

## 2011-07-29 DIAGNOSIS — E876 Hypokalemia: Secondary | ICD-10-CM

## 2011-07-29 DIAGNOSIS — R1013 Epigastric pain: Secondary | ICD-10-CM | POA: Diagnosis not present

## 2011-07-29 DIAGNOSIS — R188 Other ascites: Secondary | ICD-10-CM | POA: Diagnosis not present

## 2011-07-29 DIAGNOSIS — Z7901 Long term (current) use of anticoagulants: Secondary | ICD-10-CM | POA: Diagnosis not present

## 2011-07-29 DIAGNOSIS — E1149 Type 2 diabetes mellitus with other diabetic neurological complication: Secondary | ICD-10-CM | POA: Diagnosis present

## 2011-07-29 DIAGNOSIS — K9189 Other postprocedural complications and disorders of digestive system: Secondary | ICD-10-CM

## 2011-07-29 DIAGNOSIS — K430 Incisional hernia with obstruction, without gangrene: Principal | ICD-10-CM | POA: Diagnosis present

## 2011-07-29 DIAGNOSIS — E039 Hypothyroidism, unspecified: Secondary | ICD-10-CM | POA: Diagnosis present

## 2011-07-29 DIAGNOSIS — G4733 Obstructive sleep apnea (adult) (pediatric): Secondary | ICD-10-CM | POA: Diagnosis present

## 2011-07-29 DIAGNOSIS — K567 Ileus, unspecified: Secondary | ICD-10-CM

## 2011-07-29 DIAGNOSIS — K6389 Other specified diseases of intestine: Secondary | ICD-10-CM | POA: Diagnosis not present

## 2011-07-29 DIAGNOSIS — R1084 Generalized abdominal pain: Secondary | ICD-10-CM

## 2011-07-29 DIAGNOSIS — K56609 Unspecified intestinal obstruction, unspecified as to partial versus complete obstruction: Secondary | ICD-10-CM | POA: Diagnosis not present

## 2011-07-29 DIAGNOSIS — J9819 Other pulmonary collapse: Secondary | ICD-10-CM | POA: Diagnosis not present

## 2011-07-29 DIAGNOSIS — E1142 Type 2 diabetes mellitus with diabetic polyneuropathy: Secondary | ICD-10-CM | POA: Diagnosis present

## 2011-07-29 DIAGNOSIS — K449 Diaphragmatic hernia without obstruction or gangrene: Secondary | ICD-10-CM

## 2011-07-29 DIAGNOSIS — I4891 Unspecified atrial fibrillation: Secondary | ICD-10-CM | POA: Diagnosis present

## 2011-07-29 DIAGNOSIS — K3184 Gastroparesis: Secondary | ICD-10-CM | POA: Diagnosis not present

## 2011-07-29 DIAGNOSIS — I10 Essential (primary) hypertension: Secondary | ICD-10-CM

## 2011-07-29 DIAGNOSIS — Z8673 Personal history of transient ischemic attack (TIA), and cerebral infarction without residual deficits: Secondary | ICD-10-CM

## 2011-07-29 DIAGNOSIS — E785 Hyperlipidemia, unspecified: Secondary | ICD-10-CM

## 2011-07-29 DIAGNOSIS — F341 Dysthymic disorder: Secondary | ICD-10-CM | POA: Diagnosis present

## 2011-07-29 DIAGNOSIS — Z0000 Encounter for general adult medical examination without abnormal findings: Secondary | ICD-10-CM

## 2011-07-29 DIAGNOSIS — R5381 Other malaise: Secondary | ICD-10-CM | POA: Diagnosis not present

## 2011-07-29 DIAGNOSIS — K560 Paralytic ileus: Secondary | ICD-10-CM | POA: Diagnosis not present

## 2011-07-29 DIAGNOSIS — M1990 Unspecified osteoarthritis, unspecified site: Secondary | ICD-10-CM | POA: Diagnosis present

## 2011-07-29 DIAGNOSIS — D68318 Other hemorrhagic disorder due to intrinsic circulating anticoagulants, antibodies, or inhibitors: Secondary | ICD-10-CM | POA: Diagnosis not present

## 2011-07-29 DIAGNOSIS — K56600 Partial intestinal obstruction, unspecified as to cause: Secondary | ICD-10-CM

## 2011-07-29 DIAGNOSIS — R109 Unspecified abdominal pain: Secondary | ICD-10-CM | POA: Diagnosis not present

## 2011-07-29 DIAGNOSIS — K929 Disease of digestive system, unspecified: Secondary | ICD-10-CM | POA: Diagnosis not present

## 2011-07-29 DIAGNOSIS — Z09 Encounter for follow-up examination after completed treatment for conditions other than malignant neoplasm: Secondary | ICD-10-CM | POA: Diagnosis not present

## 2011-07-29 DIAGNOSIS — K224 Dyskinesia of esophagus: Secondary | ICD-10-CM | POA: Diagnosis not present

## 2011-07-29 DIAGNOSIS — I509 Heart failure, unspecified: Secondary | ICD-10-CM | POA: Diagnosis present

## 2011-07-29 DIAGNOSIS — R141 Gas pain: Secondary | ICD-10-CM | POA: Diagnosis not present

## 2011-07-29 DIAGNOSIS — R935 Abnormal findings on diagnostic imaging of other abdominal regions, including retroperitoneum: Secondary | ICD-10-CM | POA: Diagnosis not present

## 2011-07-29 DIAGNOSIS — R9389 Abnormal findings on diagnostic imaging of other specified body structures: Secondary | ICD-10-CM | POA: Diagnosis not present

## 2011-07-29 DIAGNOSIS — R918 Other nonspecific abnormal finding of lung field: Secondary | ICD-10-CM | POA: Diagnosis not present

## 2011-07-29 DIAGNOSIS — IMO0001 Reserved for inherently not codable concepts without codable children: Secondary | ICD-10-CM | POA: Diagnosis present

## 2011-07-29 DIAGNOSIS — R143 Flatulence: Secondary | ICD-10-CM | POA: Diagnosis not present

## 2011-07-29 DIAGNOSIS — E46 Unspecified protein-calorie malnutrition: Secondary | ICD-10-CM | POA: Diagnosis not present

## 2011-07-29 HISTORY — DX: Dysphagia, unspecified: R13.10

## 2011-07-29 HISTORY — DX: Type 2 diabetes mellitus without complications: E11.9

## 2011-07-29 HISTORY — DX: Other generalized epilepsy and epileptic syndromes, not intractable, without status epilepticus: G40.409

## 2011-07-29 HISTORY — PX: VENTRAL HERNIA REPAIR: SHX424

## 2011-07-29 HISTORY — DX: Personal history of other diseases of the digestive system: Z87.19

## 2011-07-29 LAB — GLUCOSE, CAPILLARY
Glucose-Capillary: 183 mg/dL — ABNORMAL HIGH (ref 70–99)
Glucose-Capillary: 207 mg/dL — ABNORMAL HIGH (ref 70–99)
Glucose-Capillary: 207 mg/dL — ABNORMAL HIGH (ref 70–99)
Glucose-Capillary: 180 mg/dL — ABNORMAL HIGH (ref 70–99)

## 2011-07-29 LAB — HEMOGLOBIN A1C
Hgb A1c MFr Bld: 6.3 % — ABNORMAL HIGH (ref <5.7)
Mean Plasma Glucose: 134 mg/dL — ABNORMAL HIGH (ref <117)

## 2011-07-29 LAB — PROTIME-INR
INR: 1.03 (ref 0.00–1.49)
Prothrombin Time: 13.7 seconds (ref 11.6–15.2)

## 2011-07-29 SURGERY — REPAIR, HERNIA, VENTRAL, LAPAROSCOPIC
Anesthesia: General | Site: Abdomen | Wound class: Clean

## 2011-07-29 MED ORDER — EPHEDRINE SULFATE 50 MG/ML IJ SOLN
INTRAMUSCULAR | Status: DC | PRN
  Administered 2011-07-29: 10 mg via INTRAVENOUS

## 2011-07-29 MED ORDER — INSULIN ASPART 100 UNIT/ML ~~LOC~~ SOLN
0.0000 [IU] | Freq: Three times a day (TID) | SUBCUTANEOUS | Status: DC
  Administered 2011-07-29: 5 [IU] via SUBCUTANEOUS

## 2011-07-29 MED ORDER — ALBUTEROL SULFATE HFA 108 (90 BASE) MCG/ACT IN AERS
2.0000 | INHALATION_SPRAY | Freq: Four times a day (QID) | RESPIRATORY_TRACT | Status: DC | PRN
  Filled 2011-07-29 (×2): qty 6.7

## 2011-07-29 MED ORDER — DIGOXIN 250 MCG PO TABS
250.0000 ug | ORAL_TABLET | Freq: Every day | ORAL | Status: DC
  Administered 2011-07-30: 250 ug via ORAL
  Filled 2011-07-29 (×3): qty 1

## 2011-07-29 MED ORDER — FENTANYL CITRATE 0.05 MG/ML IJ SOLN
INTRAMUSCULAR | Status: DC | PRN
  Administered 2011-07-29: 50 ug via INTRAVENOUS
  Administered 2011-07-29: 100 ug via INTRAVENOUS

## 2011-07-29 MED ORDER — MORPHINE SULFATE 2 MG/ML IJ SOLN
INTRAMUSCULAR | Status: AC
  Administered 2011-07-29: 2 mg via INTRAVENOUS
  Filled 2011-07-29: qty 1

## 2011-07-29 MED ORDER — ONDANSETRON HCL 4 MG/2ML IJ SOLN
INTRAMUSCULAR | Status: DC | PRN
  Administered 2011-07-29: 4 mg via INTRAVENOUS

## 2011-07-29 MED ORDER — CEFAZOLIN SODIUM-DEXTROSE 2-3 GM-% IV SOLR
2.0000 g | Freq: Three times a day (TID) | INTRAVENOUS | Status: AC
  Administered 2011-07-29 – 2011-07-30 (×3): 2 g via INTRAVENOUS
  Filled 2011-07-29 (×3): qty 50

## 2011-07-29 MED ORDER — LIDOCAINE HCL (CARDIAC) 20 MG/ML IV SOLN
INTRAVENOUS | Status: DC | PRN
  Administered 2011-07-29: 70 mg via INTRAVENOUS

## 2011-07-29 MED ORDER — ROCURONIUM BROMIDE 100 MG/10ML IV SOLN
INTRAVENOUS | Status: DC | PRN
  Administered 2011-07-29: 50 mg via INTRAVENOUS

## 2011-07-29 MED ORDER — FENTANYL CITRATE 0.05 MG/ML IJ SOLN: 25.0000 ug | INTRAMUSCULAR | Status: DC | PRN

## 2011-07-29 MED ORDER — SIMVASTATIN 10 MG PO TABS
10.0000 mg | ORAL_TABLET | Freq: Every day | ORAL | Status: DC
  Administered 2011-07-29 – 2011-07-30 (×2): 10 mg via ORAL
  Filled 2011-07-29 (×3): qty 1

## 2011-07-29 MED ORDER — MIDAZOLAM HCL 5 MG/5ML IJ SOLN
INTRAMUSCULAR | Status: DC | PRN
  Administered 2011-07-29: 1 mg via INTRAVENOUS

## 2011-07-29 MED ORDER — BUPIVACAINE-EPINEPHRINE 0.25% -1:200000 IJ SOLN
INTRAMUSCULAR | Status: DC | PRN
  Administered 2011-07-29: 20 mL

## 2011-07-29 MED ORDER — LACTATED RINGERS IV SOLN
INTRAVENOUS | Status: DC | PRN
  Administered 2011-07-29 (×2): via INTRAVENOUS

## 2011-07-29 MED ORDER — KCL IN DEXTROSE-NACL 20-5-0.45 MEQ/L-%-% IV SOLN
INTRAVENOUS | Status: DC
  Administered 2011-07-29 – 2011-07-31 (×5): via INTRAVENOUS
  Administered 2011-07-31: 100 mL/h via INTRAVENOUS
  Administered 2011-08-01 – 2011-08-11 (×15): via INTRAVENOUS
  Filled 2011-07-29 (×37): qty 1000

## 2011-07-29 MED ORDER — LISINOPRIL 10 MG PO TABS
10.0000 mg | ORAL_TABLET | Freq: Every day | ORAL | Status: DC
  Administered 2011-07-30: 10 mg via ORAL
  Filled 2011-07-29 (×3): qty 1

## 2011-07-29 MED ORDER — SODIUM CHLORIDE 0.9 % IR SOLN
Status: DC | PRN
  Administered 2011-07-29: 10:00:00

## 2011-07-29 MED ORDER — DEXAMETHASONE SODIUM PHOSPHATE 4 MG/ML IJ SOLN
INTRAMUSCULAR | Status: DC | PRN
  Administered 2011-07-29: 4 mg via INTRAVENOUS

## 2011-07-29 MED ORDER — POTASSIUM CHLORIDE ER 10 MEQ PO TBCR
10.0000 meq | EXTENDED_RELEASE_TABLET | Freq: Every day | ORAL | Status: DC
  Administered 2011-07-29 – 2011-07-30 (×2): 10 meq via ORAL
  Filled 2011-07-29 (×3): qty 1

## 2011-07-29 MED ORDER — AMITRIPTYLINE HCL 50 MG PO TABS
50.0000 mg | ORAL_TABLET | Freq: Every day | ORAL | Status: DC
  Administered 2011-07-29 – 2011-07-30 (×2): 50 mg via ORAL
  Filled 2011-07-29 (×3): qty 1

## 2011-07-29 MED ORDER — GABAPENTIN 100 MG PO CAPS
100.0000 mg | ORAL_CAPSULE | Freq: Two times a day (BID) | ORAL | Status: DC
  Administered 2011-07-29 – 2011-07-30 (×3): 100 mg via ORAL
  Filled 2011-07-29 (×6): qty 1

## 2011-07-29 MED ORDER — ONDANSETRON HCL 4 MG/2ML IJ SOLN
4.0000 mg | Freq: Four times a day (QID) | INTRAMUSCULAR | Status: DC | PRN
  Administered 2011-07-30 – 2011-08-06 (×4): 4 mg via INTRAVENOUS
  Filled 2011-07-29 (×4): qty 2

## 2011-07-29 MED ORDER — DILTIAZEM HCL ER 180 MG PO CP24
180.0000 mg | ORAL_CAPSULE | Freq: Every day | ORAL | Status: DC
  Administered 2011-07-30 – 2011-08-06 (×8): 180 mg via ORAL
  Filled 2011-07-29 (×8): qty 1

## 2011-07-29 MED ORDER — CYCLOBENZAPRINE HCL 5 MG PO TABS
5.0000 mg | ORAL_TABLET | Freq: Two times a day (BID) | ORAL | Status: DC
  Administered 2011-07-30: 5 mg via ORAL
  Filled 2011-07-29 (×2): qty 1

## 2011-07-29 MED ORDER — METFORMIN HCL 500 MG PO TABS
1000.0000 mg | ORAL_TABLET | Freq: Two times a day (BID) | ORAL | Status: DC
  Administered 2011-07-30 (×2): 1000 mg via ORAL
  Filled 2011-07-29 (×5): qty 2

## 2011-07-29 MED ORDER — GLYCOPYRROLATE 0.2 MG/ML IJ SOLN
INTRAMUSCULAR | Status: DC | PRN
  Administered 2011-07-29: .5 mg via INTRAVENOUS

## 2011-07-29 MED ORDER — LEVOTHYROXINE SODIUM 75 MCG PO TABS
75.0000 ug | ORAL_TABLET | Freq: Every day | ORAL | Status: DC
  Administered 2011-07-30: 75 ug via ORAL
  Filled 2011-07-29 (×3): qty 1

## 2011-07-29 MED ORDER — ALPRAZOLAM 0.5 MG PO TABS: 0.5000 mg | ORAL_TABLET | Freq: Every evening | ORAL | Status: DC | PRN

## 2011-07-29 MED ORDER — PANTOPRAZOLE SODIUM 40 MG PO TBEC
40.0000 mg | DELAYED_RELEASE_TABLET | Freq: Every day | ORAL | Status: DC
  Administered 2011-07-29 – 2011-07-30 (×2): 40 mg via ORAL
  Filled 2011-07-29 (×2): qty 1

## 2011-07-29 MED ORDER — HEPARIN SODIUM (PORCINE) 5000 UNIT/ML IJ SOLN
5000.0000 [IU] | Freq: Three times a day (TID) | INTRAMUSCULAR | Status: DC
  Administered 2011-07-29 – 2011-07-30 (×3): 5000 [IU] via SUBCUTANEOUS
  Filled 2011-07-29 (×6): qty 1

## 2011-07-29 MED ORDER — GLIPIZIDE 10 MG PO TABS
10.0000 mg | ORAL_TABLET | Freq: Two times a day (BID) | ORAL | Status: DC
  Administered 2011-07-30 (×2): 10 mg via ORAL
  Filled 2011-07-29 (×5): qty 1

## 2011-07-29 MED ORDER — ATENOLOL 25 MG PO TABS
25.0000 mg | ORAL_TABLET | Freq: Two times a day (BID) | ORAL | Status: DC
  Administered 2011-07-29 – 2011-07-30 (×3): 25 mg via ORAL
  Filled 2011-07-29 (×5): qty 1

## 2011-07-29 MED ORDER — INSULIN ASPART 100 UNIT/ML ~~LOC~~ SOLN: 0.0000 [IU] | Freq: Every day | SUBCUTANEOUS | Status: DC

## 2011-07-29 MED ORDER — NIACIN ER (ANTIHYPERLIPIDEMIC) 500 MG PO TBCR
1000.0000 mg | EXTENDED_RELEASE_TABLET | Freq: Two times a day (BID) | ORAL | Status: DC
  Administered 2011-07-29 – 2011-07-30 (×3): 1000 mg via ORAL
  Filled 2011-07-29 (×5): qty 2

## 2011-07-29 MED ORDER — HYDROCODONE-ACETAMINOPHEN 5-325 MG PO TABS
1.0000 | ORAL_TABLET | ORAL | Status: DC | PRN
  Administered 2011-07-29 – 2011-07-31 (×4): 2 via ORAL
  Filled 2011-07-29 (×4): qty 2

## 2011-07-29 MED ORDER — SODIUM CHLORIDE 0.9 % IR SOLN
Status: DC | PRN
  Administered 2011-07-29: 1

## 2011-07-29 MED ORDER — MORPHINE SULFATE 2 MG/ML IJ SOLN
2.0000 mg | INTRAMUSCULAR | Status: DC | PRN
  Administered 2011-07-29 – 2011-07-31 (×4): 2 mg via INTRAVENOUS
  Filled 2011-07-29 (×5): qty 1

## 2011-07-29 MED ORDER — ONDANSETRON HCL 4 MG PO TABS: 4.0000 mg | ORAL_TABLET | Freq: Four times a day (QID) | ORAL | Status: DC | PRN

## 2011-07-29 MED ORDER — PROPOFOL 10 MG/ML IV EMUL
INTRAVENOUS | Status: DC | PRN
  Administered 2011-07-29: 200 mg via INTRAVENOUS

## 2011-07-29 MED ORDER — CEFAZOLIN SODIUM-DEXTROSE 2-3 GM-% IV SOLR
2.0000 g | INTRAVENOUS | Status: AC
  Administered 2011-07-29: 2 g via INTRAVENOUS

## 2011-07-29 MED ORDER — NEOSTIGMINE METHYLSULFATE 1 MG/ML IJ SOLN
INTRAMUSCULAR | Status: DC | PRN
  Administered 2011-07-29: 3.5 mg via INTRAVENOUS

## 2011-07-29 MED ORDER — VECURONIUM BROMIDE 10 MG IV SOLR
INTRAVENOUS | Status: DC | PRN
  Administered 2011-07-29 (×2): 1 mg via INTRAVENOUS
  Administered 2011-07-29: 3 mg via INTRAVENOUS

## 2011-07-29 MED ORDER — FLUOXETINE HCL 20 MG PO CAPS
20.0000 mg | ORAL_CAPSULE | Freq: Every day | ORAL | Status: DC
  Administered 2011-07-30: 20 mg via ORAL
  Filled 2011-07-29 (×2): qty 1

## 2011-07-29 MED ORDER — CEFAZOLIN SODIUM-DEXTROSE 2-3 GM-% IV SOLR
INTRAVENOUS | Status: AC
  Filled 2011-07-29: qty 50

## 2011-07-29 MED ORDER — FUROSEMIDE 40 MG PO TABS
40.0000 mg | ORAL_TABLET | Freq: Every day | ORAL | Status: DC
  Administered 2011-07-30: 40 mg via ORAL
  Filled 2011-07-29 (×2): qty 1

## 2011-07-29 SURGICAL SUPPLY — 65 items
ADH SKN CLS APL DERMABOND .7 (GAUZE/BANDAGES/DRESSINGS) ×2
APL SKNCLS STERI-STRIP NONHPOA (GAUZE/BANDAGES/DRESSINGS) ×2
APPLICATOR COTTON TIP 6IN STRL (MISCELLANEOUS) ×2 IMPLANT
APPLIER CLIP LOGIC TI 5 (MISCELLANEOUS) IMPLANT
APR CLP MED LRG 33X5 (MISCELLANEOUS)
BAG DECANTER FOR FLEXI CONT (MISCELLANEOUS) ×4 IMPLANT
BENZOIN TINCTURE PRP APPL 2/3 (GAUZE/BANDAGES/DRESSINGS) ×3 IMPLANT
BINDER ABD UNIV 12 45-62 (WOUND CARE) ×1 IMPLANT
BINDER ABDOMINAL 46IN 62IN (WOUND CARE) ×3
BLADE SURG ROTATE 9660 (MISCELLANEOUS) IMPLANT
CANISTER SUCTION 2500CC (MISCELLANEOUS) ×3 IMPLANT
CHLORAPREP W/TINT 26ML (MISCELLANEOUS) ×3 IMPLANT
CLOTH BEACON ORANGE TIMEOUT ST (SAFETY) ×3 IMPLANT
COVER SURGICAL LIGHT HANDLE (MISCELLANEOUS) ×3 IMPLANT
DECANTER SPIKE VIAL GLASS SM (MISCELLANEOUS) ×3 IMPLANT
DERMABOND ADVANCED (GAUZE/BANDAGES/DRESSINGS) ×1
DERMABOND ADVANCED .7 DNX12 (GAUZE/BANDAGES/DRESSINGS) ×1 IMPLANT
DEVICE SECURE STRAP 25 ABSORB (INSTRUMENTS) ×7 IMPLANT
DEVICE TROCAR PUNCTURE CLOSURE (ENDOMECHANICALS) ×3 IMPLANT
DRAPE LAPAROSCOPIC ABDOMINAL (DRAPES) ×3 IMPLANT
DRAPE PROXIMA HALF (DRAPES) ×2 IMPLANT
DRAPE UTILITY 15X26 W/TAPE STR (DRAPE) ×6 IMPLANT
DRSG PAD ABDOMINAL 8X10 ST (GAUZE/BANDAGES/DRESSINGS) ×10 IMPLANT
ELECT CAUTERY BLADE 6.4 (BLADE) ×1 IMPLANT
ELECT REM PT RETURN 9FT ADLT (ELECTROSURGICAL) ×3
ELECTRODE REM PT RTRN 9FT ADLT (ELECTROSURGICAL) ×2 IMPLANT
GLOVE EUDERMIC 7 POWDERFREE (GLOVE) ×3 IMPLANT
GOWN PREVENTION PLUS XLARGE (GOWN DISPOSABLE) ×1 IMPLANT
GOWN STRL NON-REIN LRG LVL3 (GOWN DISPOSABLE) ×2 IMPLANT
KIT BASIN OR (CUSTOM PROCEDURE TRAY) ×3 IMPLANT
KIT ROOM TURNOVER OR (KITS) ×3 IMPLANT
MESH PHYSIO OVAL 25X35CM (Mesh General) ×2 IMPLANT
NDL HYPO 25GX1X1/2 BEV (NEEDLE) ×1 IMPLANT
NDL SPNL 22GX3.5 QUINCKE BK (NEEDLE) ×1 IMPLANT
NEEDLE HYPO 25GX1X1/2 BEV (NEEDLE) ×3 IMPLANT
NEEDLE SPNL 22GX3.5 QUINCKE BK (NEEDLE) ×3 IMPLANT
NS IRRIG 1000ML POUR BTL (IV SOLUTION) ×3 IMPLANT
PACK GENERAL/GYN (CUSTOM PROCEDURE TRAY) ×3 IMPLANT
PAD ARMBOARD 7.5X6 YLW CONV (MISCELLANEOUS) ×6 IMPLANT
PEN SKIN MARKING BROAD (MISCELLANEOUS) ×3 IMPLANT
SCALPEL HARMONIC ACE (MISCELLANEOUS) ×2 IMPLANT
SCISSORS LAP 5X35 DISP (ENDOMECHANICALS) ×2 IMPLANT
SET IRRIG TUBING LAPAROSCOPIC (IRRIGATION / IRRIGATOR) ×3 IMPLANT
SLEEVE ENDOPATH XCEL 5M (ENDOMECHANICALS) ×8 IMPLANT
SPONGE GAUZE 4X4 12PLY (GAUZE/BANDAGES/DRESSINGS) ×2 IMPLANT
STAPLER VISISTAT 35W (STAPLE) ×3 IMPLANT
SUT MON AB 4-0 PC3 18 (SUTURE) ×4 IMPLANT
SUT NOVA NAB GS-21 0 18 T12 DT (SUTURE) ×6 IMPLANT
SUT NOVA NAB GS-21 1 T12 (SUTURE) IMPLANT
SUT NOVAFIL 0 T 12 (SUTURE) ×6 IMPLANT
SUT PDS AB 1 CTX 36 (SUTURE) IMPLANT
SUT PROLENE 0 CT 1 CR/8 (SUTURE) IMPLANT
SUT VIC AB 2-0 CT1 27 (SUTURE)
SUT VIC AB 2-0 CT1 TAPERPNT 27 (SUTURE) IMPLANT
SUT VIC AB 3-0 CT1 27 (SUTURE)
SUT VIC AB 3-0 CT1 TAPERPNT 27 (SUTURE) IMPLANT
SUT VICRYL 0 TIES 12 18 (SUTURE) IMPLANT
SYR CONTROL 10ML LL (SYRINGE) ×3 IMPLANT
TOWEL OR 17X24 6PK STRL BLUE (TOWEL DISPOSABLE) ×3 IMPLANT
TOWEL OR 17X26 10 PK STRL BLUE (TOWEL DISPOSABLE) ×3 IMPLANT
TRAY FOLEY CATH 14FRSI W/METER (CATHETERS) ×3 IMPLANT
TRAY LAPAROSCOPIC (CUSTOM PROCEDURE TRAY) ×3 IMPLANT
TROCAR XCEL NON-BLD 11X100MML (ENDOMECHANICALS) ×3 IMPLANT
TROCAR XCEL NON-BLD 5MMX100MML (ENDOMECHANICALS) ×3 IMPLANT
WATER STERILE IRR 1000ML POUR (IV SOLUTION) IMPLANT

## 2011-07-29 NOTE — Op Note (Signed)
Patient Name:           [name]   Date of Surgery:        07/29/2011  Pre op Diagnosis:      Incarcerated ventral incisional hernia  Post op Diagnosis:    Multiple incarcerated ventral incisional hernias  Procedure:                1)   Laparoscopic lysis of adhesions, requiring 60 minutes                                     2)   Laparoscopic repair multiple incarcerated ventral hernias with PhysioMesh (25 X 30 cm.)  Surgeon:                     Angelia Mould. Derrell Lolling, M.D., FACS  Assistant:                      RNFA  Operative Indications:   [name] is a 61 y.o. female. She was referred by Dr. Baltazar Apo at [facility] internal medicine clinic for evaluation of a ventral hernia. Dr. Antoine Poche is her cardiologist.  This patient had an open hiatal hernia and antireflux repair by Dr. Theron Arista young 23 years ago. She did fairly well from that but occasionally has to have esophageal dilatation. Apparently she has also had a cholecystectomy. She's noticed a painful bulge slightly to the left of the midline midway between her xiphoid and her umbilicus. She's not had any nausea or vomiting.    A   CT scan was done on March 1. This shows a small ventral hernia in the upper midline containing fat. There are also bilateral fat-containing inguinal hernias.  Past surgical history includes a hiatal hernia repair, abdominal hysterectomy, and some type of surgery through an infraumbilical incision. She be she also may have had a femoral hernia repair.  Significant comorbidities include atrial fibrillation on Coumadin, TIA, GERD, hypertension, depression, obesity, hyperlipidemia, diabetes. She is on numerous medications.   I have examined her and counseled her as an outpatient. She is strongly motivated to have a ventral hernia repaired. She is brought to the operating room electively. She has been off of her Coumadin for 6 days with a Lovenox bridge.   Operative Findings:       She had extensive adhesions to the  anterior abdominal wall. There were loops of small bowel that were close to the parietal peritoneum, making the dissection somewhat tedious, but the bowel was not obviously incarcerated in her multiple hernias. There was a lot of omentum and fatty tissue that was incarcerated into two of the  multiple ventral hernias. I was able to dissected all of this  out slowly. This took one hour to do the lysis of adhesions. The hernia was then repaired with a 25 cm transversely by 30 cm vertically piece of the PhysioMesh.  Procedure in Detail:          Following the induction of general endotracheal anesthesia, intravenous antibiotics were given and a Foley catheter was placed. The abdominal wall was prepped and draped in a sterile fashion. Surgical time out was performed. 0.5% Marcaine with epinephrine was used in all the trocar sites.  A 5 mm optical trocar was placed in the left subcostal region. Optical entry was atraumatic and pneumoperitoneum was created. There was no sign of bleeding  or injury. We placed an 11 mm trocar in the left lateral bowel wall, a 5 mm trocar in the left lower quadrant, and three 5 mm trocars on the right side. We spent one hour taking all the adhesions down at as described above. We found at least 4 defects of the midline, 2 of these were large enough to have incarcerated omentum in them. This was slowly dissected out and completely reduced. There were a lot of adhesions in the left upper quadrant from her previous hiatal hernia repair which were dissected back as far as necessary. I then measured the entire area of the defects. I felt that I should cover all the defects which was essentially the entire upper midline incision. I brought a 35 cm x 25 cm piece of the Physio- mesh to  the operative field and soaked it in  antibiotic solution. I cut it down to where it was 30 cm vertically by 25 cm transversely. I then released the  pneumoperitoneum and drew a template on the abdominal wall for  the placement of the mesh and the fixation sutures. I placed 8 fixation sutures of 0 Novafil in the mesh at 8 equidistant points. I then rolled the mesh up and inserted into the abdominal cavity after pneumoperitoneum had been reestablished. I positioned  the mesh and  at each of the 8 suture fixation site I made a puncture wound and using an Endo Close device pulled the tails of the Novafil sutures up through  the abdominal wall, being careful to take a 1 cm bite of fascia. After all this was done I lifted all the sutures up and the mesh deployed quite nicely with no redundancy or fluting anywhere. I tied all of  the sutures. I then very carefully further secured the mesh to the abdominal wall with the Secure Strap tacking device. I did this in a double crown technique using essentially 75 firings of the Secure Strap device. This provided very secure fixation. I very carefully inspected around the circumference of the fixation 2 or 3 times to make sure I didn't leave any gaps and there were none. There was no bleeding from any of the fixation sites. I took a good look at the small bowel and colon and found no bleeding or injury. The trocars were removed and the pneumoperitoneum was released. All the skin incisions were closed with subcuticular sutures of 4-0 Monocryl and Dermabond. A Velcro abdominal binder was placed and the patient taken to recovery room in stable condition. There were no complications. EBL 15 cc. Counts correct.     Angelia Mould. Derrell Lolling, M.D., FACS General and Minimally Invasive Surgery Breast and Colorectal Surgery  07/29/2011 11:09 AM

## 2011-07-29 NOTE — Anesthesia Procedure Notes (Addendum)
Procedure Name: Intubation Date/Time: 07/29/2011 8:53 AM Performed by: Hermelinda Dellen A Pre-anesthesia Checklist: Patient identified, Emergency Drugs available, Suction available, Patient being monitored and Timeout performed Patient Re-evaluated:Patient Re-evaluated prior to inductionOxygen Delivery Method: Circle system utilized Preoxygenation: Pre-oxygenation with 100% oxygen Intubation Type: IV induction Ventilation: Oral airway inserted - appropriate to patient size and Mask ventilation with difficulty Laryngoscope size: DL x1 MAC 3, grade III view with cricoid pressure- immobile epiglottis noted.  DL x1 Miller 2 by MDA grade III view. Glidescope utilized with grade I view.  Grade View: Grade I Tube type: Oral Tube size: 7.0 mm Number of attempts: 3 ( ) Airway Equipment and Method: Rigid stylet,  Video-laryngoscopy and Oral airway Placement Confirmation: breath sounds checked- equal and bilateral,  positive ETCO2,  CO2 detector and ETT inserted through vocal cords under direct vision (ETT inserted through vocal cords under glidescope vision) Secured at: 21 (teeth) cm Tube secured with: Tape Dental Injury: Dental damage  Difficulty Due To: Difficulty was unanticipated, Difficult Airway- due to immobile epiglottis and Difficult Airway- due to dentition Comments: Right upper front tooth dislodged with use of oral airway for mask ventilation. Tooth was "super glued in place weekly" per pt report. Tooth retrieved without issue.

## 2011-07-29 NOTE — Anesthesia Preprocedure Evaluation (Addendum)
Anesthesia Evaluation  Patient identified by MRN, date of birth, ID band Patient awake    Reviewed: Allergy & Precautions, H&P , NPO status , Patient's Chart, lab work & pertinent test results, reviewed documented beta blocker date and time   History of Anesthesia Complications Negative for: history of anesthetic complications  Airway Mallampati: II      Dental  (+) Poor Dentition, Dental Advisory Given, Missing, Chipped and Loose,    Pulmonary shortness of breath and with exertion, sleep apnea (noncompliant with CPAP) ,  breath sounds clear to auscultation        Cardiovascular Exercise Tolerance: Good hypertension, Pt. on medications +CHF + dysrhythmias Atrial Fibrillation  Pt reports bilateral carotid stenosis   Neuro/Psych Seizures - (as a child), Well Controlled,  Anxiety Depression Neuropathy in feet.  TIA (no residual sx)   GI/Hepatic Neg liver ROS, hiatal hernia (23 years ago repaired), GERD-  Medicated and Controlled,  Endo/Other  Diabetes mellitus-, Well Controlled, Type 2, Oral Hypoglycemic AgentsHypothyroidism   Renal/GU negative Renal ROS  negative genitourinary   Musculoskeletal  (+) Arthritis -, Osteoarthritis,  Fibromyalgia -, narcotic dependent  Abdominal   Peds negative pediatric ROS (+)  Hematology negative hematology ROS (+) On Lovenox   Anesthesia Other Findings   Reproductive/Obstetrics negative OB ROS                        Anesthesia Physical Anesthesia Plan  ASA: III  Anesthesia Plan: General   Post-op Pain Management:    Induction: Intravenous  Airway Management Planned: Oral ETT  Additional Equipment:   Intra-op Plan:   Post-operative Plan:   Informed Consent: I have reviewed the patients History and Physical, chart, labs and discussed the procedure including the risks, benefits and alternatives for the proposed anesthesia with the patient or authorized  representative who has indicated his/her understanding and acceptance.     Plan Discussed with: CRNA  Anesthesia Plan Comments:         Anesthesia Quick Evaluation

## 2011-07-29 NOTE — Anesthesia Postprocedure Evaluation (Signed)
  Anesthesia Post-op Note  Patient: [name]  Procedure(s) Performed: Procedure(s) (LRB): LAPAROSCOPIC VENTRAL HERNIA (N/A) INSERTION OF MESH (N/A)  Patient Location: PACU  Anesthesia Type: General  Level of Consciousness: awake  Airway and Oxygen Therapy: Patient Spontanous Breathing  Post-op Pain: mild  Post-op Assessment: Post-op Vital signs reviewed  Post-op Vital Signs: Reviewed  Complications: No apparent anesthesia complications

## 2011-07-29 NOTE — Transfer of Care (Signed)
Immediate Anesthesia Transfer of Care Note  Patient: [name]  Procedure(s) Performed: Procedure(s) (LRB): LAPAROSCOPIC VENTRAL HERNIA (N/A) INSERTION OF MESH (N/A)  Patient Location: PACU  Anesthesia Type: General  Level of Consciousness: awake  Airway & Oxygen Therapy: Patient Spontanous Breathing  Post-op Assessment: Report given to PACU RN  Post vital signs: Reviewed  Complications: No apparent anesthesia complications

## 2011-07-29 NOTE — Preoperative (Signed)
Beta Blockers   Reason not to administer Beta Blockers:Not Applicable 

## 2011-07-29 NOTE — Interval H&P Note (Signed)
History and Physical Interval Note:  07/29/2011 8:19 AM  [name]  has presented today for surgery, with the diagnosis of ventral hernia   The goals of treatment and the various methods of treatment have been discussed with the patient and family. After consideration of risks, benefits and other options for treatment, the patient has consented to  Procedure(s) (LRB): LAPAROSCOPIC VENTRAL HERNIA (N/A), POSSIBLE OPEN INSERTION OF MESH (N/A) HERNIA REPAIR VENTRAL ADULT (N/A) as a surgical intervention .  The patients' history has been reviewed and the patient examined today, no change in status, stable for surgery.  I have reviewed the patients' chart and labs.  Questions were answered to the patient's satisfaction.     Ernestene Mention

## 2011-07-29 NOTE — Progress Notes (Addendum)
Dr. Derrell Lolling paged about Ancef order and PCN allergy- patient states only had skin reaction-no SOB or throat swelling, etc. Dr. Derrell Lolling stated okay to give Ancef.   Also while on the phone with him he decided to hold Heparin until PT back this a.m. RN had already scanned and was planning to give when MD called so it looks in [facility] like it was given prior. EDITED MAR to correct due to MD interruption and waiting on PT results-MAR correct time Heparin given now.  Just spoke with MD and he wants me to give Heparin now that PT INR was 1.03.

## 2011-07-29 NOTE — Transfer of Care (Signed)
Immediate Anesthesia Transfer of Care Note  Patient: [name]  Procedure(s) Performed: Procedure(s) (LRB): LAPAROSCOPIC VENTRAL HERNIA (N/A) INSERTION OF MESH (N/A)  Patient Location: PACU  Anesthesia Type: General  Level of Consciousness: sedated  Airway & Oxygen Therapy: Patient Spontanous Breathing and Patient connected to face mask oxygen  Post-op Assessment: Report given to PACU RN, Post -op Vital signs reviewed and stable and Patient moving all extremities  Post vital signs: Reviewed and stable  Complications: No apparent anesthesia complications

## 2011-07-30 ENCOUNTER — Encounter (HOSPITAL_COMMUNITY): Payer: Self-pay | Admitting: General Surgery

## 2011-07-30 DIAGNOSIS — D68318 Other hemorrhagic disorder due to intrinsic circulating anticoagulants, antibodies, or inhibitors: Secondary | ICD-10-CM

## 2011-07-30 DIAGNOSIS — E119 Type 2 diabetes mellitus without complications: Secondary | ICD-10-CM

## 2011-07-30 LAB — CBC
HCT: 36.6 % (ref 36.0–46.0)
Hemoglobin: 12.6 g/dL (ref 12.0–15.0)
MCH: 32.1 pg (ref 26.0–34.0)
MCHC: 34.4 g/dL (ref 30.0–36.0)
MCV: 93.4 fL (ref 78.0–100.0)
Platelets: 252 10*3/uL (ref 150–400)
RBC: 3.92 MIL/uL (ref 3.87–5.11)
RDW: 12.8 % (ref 11.5–15.5)
WBC: 12.7 10*3/uL — ABNORMAL HIGH (ref 4.0–10.5)

## 2011-07-30 LAB — GLUCOSE, CAPILLARY
Glucose-Capillary: 146 mg/dL — ABNORMAL HIGH (ref 70–99)
Glucose-Capillary: 162 mg/dL — ABNORMAL HIGH (ref 70–99)
Glucose-Capillary: 158 mg/dL — ABNORMAL HIGH (ref 70–99)

## 2011-07-30 LAB — BASIC METABOLIC PANEL
BUN: 6 mg/dL (ref 6–23)
CO2: 28 mEq/L (ref 19–32)
Calcium: 9.5 mg/dL (ref 8.4–10.5)
Chloride: 102 mEq/L (ref 96–112)
Creatinine, Ser: 0.51 mg/dL (ref 0.50–1.10)
GFR calc Af Amer: >90 mL/min (ref >90)
GFR calc non Af Amer: >90 mL/min (ref >90)
Glucose, Bld: 178 mg/dL — ABNORMAL HIGH (ref 70–99)
Potassium: 4.1 mEq/L (ref 3.5–5.1)
Sodium: 141 mEq/L (ref 135–145)

## 2011-07-30 MED ORDER — CHLORHEXIDINE GLUCONATE 0.12 % MT SOLN
15.0000 mL | Freq: Two times a day (BID) | OROMUCOSAL | Status: DC
  Administered 2011-07-30 – 2011-08-04 (×9): 15 mL via OROMUCOSAL
  Filled 2011-07-30 (×10): qty 15

## 2011-07-30 MED ORDER — BIOTENE DRY MOUTH MT LIQD
15.0000 mL | Freq: Two times a day (BID) | OROMUCOSAL | Status: DC
  Administered 2011-07-30 – 2011-08-04 (×12): 15 mL via OROMUCOSAL

## 2011-07-30 MED ORDER — WARFARIN - PHARMACIST DOSING INPATIENT: Freq: Every day | Status: DC

## 2011-07-30 MED ORDER — NIACIN ER 500 MG PO CPCR
1000.0000 mg | ORAL_CAPSULE | Freq: Two times a day (BID) | ORAL | Status: DC
  Administered 2011-07-30: 1000 mg via ORAL
  Filled 2011-07-30 (×3): qty 2

## 2011-07-30 MED ORDER — INSULIN ASPART 100 UNIT/ML ~~LOC~~ SOLN
0.0000 [IU] | Freq: Three times a day (TID) | SUBCUTANEOUS | Status: DC
  Administered 2011-07-30 (×2): 4 [IU] via SUBCUTANEOUS

## 2011-07-30 MED ORDER — DIPHENHYDRAMINE HCL 50 MG/ML IJ SOLN
25.0000 mg | Freq: Once | INTRAMUSCULAR | Status: AC
  Administered 2011-07-30: 25 mg via INTRAVENOUS

## 2011-07-30 MED ORDER — DIPHENHYDRAMINE HCL 50 MG/ML IJ SOLN
INTRAMUSCULAR | Status: AC
  Administered 2011-07-30: 25 mg
  Filled 2011-07-30: qty 1

## 2011-07-30 MED ORDER — ENOXAPARIN SODIUM 120 MG/0.8ML ~~LOC~~ SOLN
120.0000 mg | SUBCUTANEOUS | Status: DC
  Administered 2011-07-30: 120 mg via SUBCUTANEOUS
  Filled 2011-07-30 (×2): qty 0.8

## 2011-07-30 MED ORDER — INSULIN ASPART 100 UNIT/ML ~~LOC~~ SOLN: 0.0000 [IU] | Freq: Every day | SUBCUTANEOUS | Status: DC

## 2011-07-30 MED ORDER — WARFARIN SODIUM 7.5 MG PO TABS
7.5000 mg | ORAL_TABLET | Freq: Once | ORAL | Status: AC
  Administered 2011-07-30: 7.5 mg via ORAL
  Filled 2011-07-30: qty 1

## 2011-07-30 NOTE — Progress Notes (Signed)
ANTICOAGULATION CONSULT NOTE - Initial Consult  Pharmacy Consult for coumadin and lovenox Indication: afib, bridge therapy  Allergies  Allergen Reactions  . Gemfibrozil Swelling    REACTION: Angioedema  . Latex Other (See Comments)    Blisters where touched or applied  . Penicillins Hives    Will spread in patches all over the body.  . Adhesive (Tape) Other (See Comments)    Will blister skin where applied - do not use BAND-AIDS.  Marland Kitchen Codeine Hives    Will spread in patches all over the body.    Patient Measurements: Height: 5\' 3"  (160 cm) Weight: 193 lb 14.4 oz (87.952 kg) IBW/kg (Calculated) : 52.4  VitalSigns:  Temp: 98.5 F (36.9 C) (05/23 0519) Temp src: Oral (05/23 0519) BP: 123/69 mmHg (05/23 0519) Pulse Rate: 100  (05/23 0519)  Labs:  Basename 07/30/11 0710 07/29/11 0644  HGB 12.6 --  HCT 36.6 --  PLT 252 --  APTT -- 32  LABPROT 13.2 13.7  INR 0.98 1.03  HEPARINUNFRC -- --  CREATININE 0.51 --  CKTOTAL -- --  CKMB -- --  TROPONINI -- --    Estimated Creatinine Clearance: 75.7 ml/min (by C-G formula based on Cr of 0.51).   Medical History: Past Medical History  Diagnosis Date  . Depression   . Obesity   . Skin neoplasm   . Bunion   . Carotid stenosis   . Fibromyalgia   . Internal hemorrhoid   . GERD (gastroesophageal reflux disease)   . Chronic gastritis   . Transaminase or LDH elevation   . Mild cognitive impairment   . Hyperlipidemia   . Fatty liver   . Lumbar back pain   . Diabetic peripheral neuropathy   . Diverticulosis   . Esophageal stricture   . Hypertension   . Cholecystitis     s/p cholecystectomy  . Sarcoidosis   . Fibromyalgia   . CHF (congestive heart failure)   . Skin cancer 1990's    "front of my right leg"  . Atrial fibrillation   . OSA (obstructive sleep apnea) 2003    "can't sleep w/that darm machine"  . Exertional dyspnea   . Hypothyroidism   . Type II diabetes mellitus   . TIA (transient ischemic attack)    07/29/11 pt denies this histor  . H/O hiatal hernia   . Epileptic seizure, tonic     .No meds since age of 13.  . Osteoarthritis     Medications:  Prescriptions prior to admission  Medication Sig Dispense Refill  . ALPRAZolam (XANAX) 0.5 MG tablet Take 1 tablet (0.5 mg total) by mouth at bedtime as needed for sleep or anxiety.  6 tablet  0  . amitriptyline (ELAVIL) 50 MG tablet Take 50 mg by mouth at bedtime.      Marland Kitchen atenolol (TENORMIN) 25 MG tablet Take 25 mg by mouth 2 (two) times daily.      . B Complex-Biotin-FA (SUPER B-50 B COMPLEX PO) Take 1 tablet by mouth 2 (two) times daily.       . Calcium Carbonate-Vitamin D (CALCIUM 600+D) 600-400 MG-UNIT per tablet Take 1 tablet by mouth 2 (two) times daily with a meal.       . cyclobenzaprine (FLEXERIL) 5 MG tablet Take 5 mg by mouth 2 (two) times daily as needed. For fibromyalgia      . digoxin (LANOXIN) 0.25 MG tablet Take 250 mcg by mouth daily.      Marland Kitchen diltiazem (DILACOR XR) 180  MG 24 hr capsule Take 180 mg by mouth daily.      Marland Kitchen enoxaparin (LOVENOX) 120 MG/0.8ML injection Inject 0.8 mLs (120 mg total) into the skin daily. Take as directed by coumadin clinic, inject daily pre op briding as directed  10 Syringe  1  . fish oil-omega-3 fatty acids 1000 MG capsule Take 1 g by mouth 2 (two) times daily.       Marland Kitchen FLUoxetine (PROZAC) 20 MG capsule Take 20 mg by mouth daily.      . furosemide (LASIX) 40 MG tablet Take 40 mg by mouth daily.      Marland Kitchen gabapentin (NEURONTIN) 100 MG capsule Take 100 mg by mouth 2 (two) times daily.      Marland Kitchen glipiZIDE (GLUCOTROL) 10 MG tablet Take 10 mg by mouth 2 (two) times daily before a meal.      . HYDROcodone-acetaminophen (VICODIN) 5-500 MG per tablet Take 1 tablet by mouth every 12 (twelve) hours as needed. For pain  90 tablet  1  . levothyroxine (SYNTHROID, LEVOTHROID) 75 MCG tablet Take 1 tablet (75 mcg total) by mouth daily.  90 tablet  3  . lisinopril (PRINIVIL,ZESTRIL) 10 MG tablet Take 1 tablet (10 mg total) by  mouth daily.  90 tablet  3  . metFORMIN (GLUCOPHAGE) 1000 MG tablet Take 1 tablet (1,000 mg total) by mouth 2 (two) times daily with a meal.  180 tablet  3  . Multiple Vitamin (MULTIVITAMIN) capsule Take 1 capsule by mouth daily.       . niacin (NIASPAN) 500 MG CR tablet Take 2 tablets (1,000 mg total) by mouth 2 (two) times daily.  360 tablet  3  . omeprazole (PRILOSEC) 20 MG capsule Take 40 mg by mouth as needed. For acid reflux      . potassium chloride (K-DUR) 10 MEQ tablet Take 10 mEq by mouth daily.      . pravastatin (PRAVACHOL) 80 MG tablet Take 80 mg by mouth daily.      Marland Kitchen albuterol (PROVENTIL HFA;VENTOLIN HFA) 108 (90 BASE) MCG/ACT inhaler Inhale 2 puffs into the lungs every 6 (six) hours as needed. For shortness of breath      . warfarin (COUMADIN) 5 MG tablet Take 2.5-5 mg by mouth daily. Takes 5 mg on Monday Wednesday and Friday. Takes 2.5mg  on Tuesday, Thursday, Saturday, and Sunday.        Assessment:  61 yo f s/p ventral hernia repair to resume bridge therapy with lovenox and coumadin for afib. I have reviewed the [place] coumadin clinic note from 07/20/11.  Her coumadin dose was 5 mg MWF and 2.5 mg TTSS.  She was started on LMWH 120 mg q24 as an outpatient so coumadin could be held for hernia repair.  Her last lovenox 120mg  dose pre-op was 07/28/11 at 2330.  She has been on heparin 5000 units sq q8h post-op.   The coumadin clinic plan for post-op was coumadin 7.5 mg x 2 days and lovenox until INR > 2.0.  Her weight today is 88 kg.  Lovenox 120 mg q24 is a little below 1.5 mg/kg/day but comes in a prefilled syringe in this dose.    Goal of Therapy:  INR 2-3 Monitor platelets by anticoagulation protocol: Yes   Plan:  LMWH 120 mg q24 (as per [place] coum clinic outpt dose) Coumadin 7.5 mg po x 1 dose today LMWH bridge therapy until INR > 2 CBC q72 hrs while on LMWH for pltc monitoring.   Len Childs  T 07/30/2011,11:02 AM

## 2011-07-30 NOTE — Care Management Note (Signed)
  Page 2 of 2   08/06/2011     10:09:47 AM   CARE MANAGEMENT NOTE 08/06/2011  Patient:  [name], [name]   Account Number:  [id number]  Date Initiated:  07/30/2011  Documentation initiated by:  Ronny Flurry  Subjective/Objective Assessment:   DX: Multiple incarcerated ventral incisional hernias     Action/Plan:   07-29-11 1)    lysis of adhesions, requiring 60 minutes   2)   Laparoscopic repair multiple incarcerated ventral hernias with PhysioMesh (25 X 30 cm.)   Anticipated DC Date:  08/07/2011   Anticipated DC Plan:  HOME W HOME HEALTH SERVICES      DC Planning Services  CM consult      Choice offered to / List presented to:  C-1 Patient   DME arranged  3-N-1  Levan Hurst      DME agency  [facility]     [facility] arranged  HH-1 RN  HH-2 PT  HH-3 OT      Status of service:  In process, will continue to follow Medicare Important Message given?   (If response is "NO", the following Medicare IM given date fields will be blank) Date Medicare IM given:   Date Additional Medicare IM given:    Discharge Disposition:  HOME W HOME HEALTH SERVICES  Per UR Regulation:  Reviewed for med. necessity/level of care/duration of stay  If discussed at Long Length of Stay Meetings, dates discussed:    Comments:   08-06-11 Patient wants [facility] for [facility], PT, OT , roolling walker , and 3 in 1 .  Patient will be discharging to her son's [place] ) home : [address], [place] , phone [phone]. Patient plans on staying with her son for one week than returning to her home in [place]. Ronny Flurry RN BSN [phone]     08-05-11 Order received for HHPT / OT . Spoke with patient , not discharging today.  Patient lives in [place] , however, patient is going to stay with her son in [place] post discharge for one week.  Left list of home health agencies for [facility] and [place] with patient.  Patient states she will discuss decision  regarding which home health agency with her son this evening. Will follow up with patient tomorrow . Ronny Flurry RN BSN [phone]  08-03-11 Patient has persistent post op ileus , on IVF , Reglan IV Q 6 hrs and NGT. Ronny Flurry RN BSN [phone]    07-30-11 Referral for medication assistance.  Spoke with patient , she does have Medicare part A and B , but not part D ( prescription coverage ) .   She does have prescription coverage , patient does not have coverage card with her and does not remember the company it is through. She has $250 deductable .  Her son has helps her with her medications when needed.  Gave patient information on applying for part D .   Patient 's current rent is over $ 500.00 . She is moving , new rent will be $400/ month no deposit required.   She has a friend who lives with her " part time".  Her friend and son will be able to help her after discharge.  Patient 's PCP is downstairs at [facility].  Will follow to see PT/ OT recommendations.   Ronny Flurry RN BSN [phone]

## 2011-07-30 NOTE — Progress Notes (Signed)
1 Day Post-Op  Subjective: Alert and stable. No nausea. Appropriate incisional pain. Voiding without any difficulty. Only complaint is abdominal pain.  Morning lab work is pending.  Objective: Vital signs in last 24 hours: Temp:  [97.6 F (36.4 C)-99.2 F (37.3 C)] 98.5 F (36.9 C) (05/23 0519) Pulse Rate:  [70-107] 100  (05/23 0519) Resp:  [17-22] 18  (05/23 0519) BP: (109-139)/(50-72) 123/69 mmHg (05/23 0519) SpO2:  [92 %-99 %] 96 % (05/23 0519) Weight:  [193 lb 14.4 oz (87.952 kg)] 193 lb 14.4 oz (87.952 kg) (05/23 0500) Last BM Date: 07/29/11  Intake/Output from previous day: 05/22 0701 - 05/23 0700 In: 3107.6 [P.O.:240; I.V.:2867.6] Out: 1630 [Urine:1630] Intake/Output this shift:    General appearance: alert. Mental status normal. Skin warm and dry. Minimal distress from incisional pain. Very talkative. Resp: clear to auscultation bilaterally GI: soft. All incisions look good. Not distended. Appropriate incisional tenderness. Hypoactive bowel sounds.  Lab Results:  Results for orders placed during the hospital encounter of 07/29/11 (from the past 24 hour(s))  GLUCOSE, CAPILLARY     Status: Abnormal   Collection Time   07/29/11 11:40 AM      Component Value Range   Glucose-Capillary 183 (*) 70 - 99 (mg/dL)   Comment 1 Documented in Chart    HEMOGLOBIN A1C     Status: Abnormal   Collection Time   07/29/11  2:22 PM      Component Value Range   Hemoglobin A1C 6.3 (*) <5.7 (%)   Mean Plasma Glucose 134 (*) <117 (mg/dL)  GLUCOSE, CAPILLARY     Status: Abnormal   Collection Time   07/29/11  6:03 PM      Component Value Range   Glucose-Capillary 207 (*) 70 - 99 (mg/dL)   Comment 1 Notify RN    GLUCOSE, CAPILLARY     Status: Abnormal   Collection Time   07/29/11  6:03 PM      Component Value Range   Glucose-Capillary 207 (*) 70 - 99 (mg/dL)   Comment 1 Notify RN    GLUCOSE, CAPILLARY     Status: Abnormal   Collection Time   07/29/11  6:03 PM      Component Value  Range   Glucose-Capillary 207 (*) 70 - 99 (mg/dL)   Comment 1 Notify RN    GLUCOSE, CAPILLARY     Status: Abnormal   Collection Time   07/29/11 10:05 PM      Component Value Range   Glucose-Capillary 180 (*) 70 - 99 (mg/dL)   Comment 1 Notify RN     Comment 2 Documented in Chart       Studies/Results: @RISRSLT24 @     . amitriptyline  50 mg Oral QHS  . antiseptic oral rinse  15 mL Mouth Rinse q12n4p  . atenolol  25 mg Oral BID  . ceFAZolin      .  ceFAZolin (ANCEF) IV  2 g Intravenous 60 min Pre-Op  .  ceFAZolin (ANCEF) IV  2 g Intravenous Q8H  . chlorhexidine  15 mL Mouth Rinse BID  . cyclobenzaprine  5 mg Oral q12n4p  . digoxin  250 mcg Oral Daily  . diltiazem  180 mg Oral Daily  . FLUoxetine  20 mg Oral Daily  . furosemide  40 mg Oral Daily  . gabapentin  100 mg Oral BID  . glipiZIDE  10 mg Oral BID AC  . heparin  5,000 Units Subcutaneous Once  . heparin  5,000 Units Subcutaneous  Q8H  . insulin aspart  0-15 Units Subcutaneous TID WC  . insulin aspart  0-5 Units Subcutaneous QHS  . levothyroxine  75 mcg Oral QAC breakfast  . lisinopril  10 mg Oral Daily  . metFORMIN  1,000 mg Oral BID WC  . morphine      . niacin  1,000 mg Oral BID  . pantoprazole  40 mg Oral Q1200  . potassium chloride  10 mEq Oral Daily  . simvastatin  10 mg Oral q1800     Assessment/Plan: s/p Procedure(s): LAPAROSCOPIC VENTRAL HERNIA INSERTION OF MESH  POD #1. Stable Advance diet and activities. Not ready for discharge home yet.  Chronic atrial fibrillation on Coumadin. Stable. Continue subcutaneous heparin this morning. Check laboratory work. If labs looked good we will consider restarting Lovenox bridge and Coumadin. Upon discharge, will follow Dr. Jenene Slicker recommendations regarding Lovenox bridging  Non-insulin-dependent diabetes mellitus. Stable. On sliding scale insulin, will modify to resistant scale. Continue to monitor. Restart Glucophage and metformin today.  Hypertension.  Controlled  Anxiety and depression. On Prozac.  Patient Active Hospital Problem List: No active hospital problems.   LOS: 1 day    Raylee Strehl M. Derrell Lolling, M.D., [facility] General and Minimally invasive Surgery Breast and Colorectal Surgery Office:   [phone] Pager:   [phone]  07/30/2011  . .prob

## 2011-07-31 ENCOUNTER — Other Ambulatory Visit: Payer: Self-pay

## 2011-07-31 ENCOUNTER — Inpatient Hospital Stay (HOSPITAL_COMMUNITY): Payer: Medicare Other

## 2011-07-31 LAB — DIFFERENTIAL
Basophils Absolute: 0 10*3/uL (ref 0.0–0.1)
Basophils Relative: 0 % (ref 0–1)
Eosinophils Absolute: 0 10*3/uL (ref 0.0–0.7)
Lymphocytes Relative: 14 % (ref 12–46)
Lymphs Abs: 2.3 10*3/uL (ref 0.7–4.0)
Monocytes Absolute: 1.2 10*3/uL — ABNORMAL HIGH (ref 0.1–1.0)
Monocytes Relative: 7 % (ref 3–12)
Neutro Abs: 13.2 10*3/uL — ABNORMAL HIGH (ref 1.7–7.7)
Neutrophils Relative %: 79 % — ABNORMAL HIGH (ref 43–77)

## 2011-07-31 LAB — GLUCOSE, CAPILLARY
Glucose-Capillary: 145 mg/dL — ABNORMAL HIGH (ref 70–99)
Glucose-Capillary: 157 mg/dL — ABNORMAL HIGH (ref 70–99)
Glucose-Capillary: 188 mg/dL — ABNORMAL HIGH (ref 70–99)
Glucose-Capillary: 203 mg/dL — ABNORMAL HIGH (ref 70–99)

## 2011-07-31 LAB — CBC
HCT: 44.5 % (ref 36.0–46.0)
MCH: 32.1 pg (ref 26.0–34.0)
MCHC: 33.9 g/dL (ref 30.0–36.0)
MCV: 94.5 fL (ref 78.0–100.0)
Platelets: 277 10*3/uL (ref 150–400)
RBC: 4.71 MIL/uL (ref 3.87–5.11)
RDW: 12.9 % (ref 11.5–15.5)
WBC: 16.8 10*3/uL — ABNORMAL HIGH (ref 4.0–10.5)

## 2011-07-31 LAB — CARDIAC PANEL(CRET KIN+CKTOT+MB+TROPI)
CK, MB: 2.7 ng/mL (ref 0.3–4.0)
Relative Index: INVALID (ref 0.0–2.5)
Total CK: 84 U/L (ref 7–177)
Troponin I: <0.3 ng/mL (ref <0.30)

## 2011-07-31 LAB — BASIC METABOLIC PANEL
Chloride: 101 mEq/L (ref 96–112)
Creatinine, Ser: 0.44 mg/dL — ABNORMAL LOW (ref 0.50–1.10)
GFR calc Af Amer: >90 mL/min (ref >90)
GFR calc non Af Amer: >90 mL/min (ref >90)

## 2011-07-31 LAB — PROTIME-INR
INR: 0.99 (ref 0.00–1.49)
Prothrombin Time: 13.3 seconds (ref 11.6–15.2)

## 2011-07-31 MED ORDER — METOPROLOL TARTRATE 1 MG/ML IV SOLN
5.0000 mg | Freq: Four times a day (QID) | INTRAVENOUS | Status: DC
  Filled 2011-07-31 (×4): qty 5

## 2011-07-31 MED ORDER — HEPARIN SODIUM (PORCINE) 5000 UNIT/ML IJ SOLN
5000.0000 [IU] | Freq: Three times a day (TID) | INTRAMUSCULAR | Status: DC
  Filled 2011-07-31 (×4): qty 1

## 2011-07-31 MED ORDER — PANTOPRAZOLE SODIUM 40 MG PO TBEC
40.0000 mg | DELAYED_RELEASE_TABLET | Freq: Every day | ORAL | Status: DC
  Administered 2011-07-31 – 2011-08-05 (×6): 40 mg via ORAL
  Filled 2011-07-31 (×6): qty 1

## 2011-07-31 MED ORDER — ENOXAPARIN SODIUM 40 MG/0.4ML ~~LOC~~ SOLN
40.0000 mg | SUBCUTANEOUS | Status: DC
  Administered 2011-07-31 – 2011-08-03 (×4): 40 mg via SUBCUTANEOUS
  Filled 2011-07-31 (×5): qty 0.4

## 2011-07-31 MED ORDER — INSULIN ASPART 100 UNIT/ML ~~LOC~~ SOLN
0.0000 [IU] | SUBCUTANEOUS | Status: DC
  Administered 2011-07-31: 7 [IU] via SUBCUTANEOUS

## 2011-07-31 MED ORDER — ACETAMINOPHEN 325 MG PO TABS: 650.0000 mg | ORAL_TABLET | Freq: Four times a day (QID) | ORAL | Status: DC

## 2011-07-31 MED ORDER — BISACODYL 10 MG RE SUPP
10.0000 mg | Freq: Every day | RECTAL | Status: DC
  Administered 2011-08-01 – 2011-08-10 (×4): 10 mg via RECTAL
  Filled 2011-07-31 (×6): qty 1

## 2011-07-31 MED ORDER — FLUOXETINE HCL 20 MG PO CAPS
20.0000 mg | ORAL_CAPSULE | Freq: Every day | ORAL | Status: DC
  Administered 2011-07-31 – 2011-08-16 (×16): 20 mg via ORAL
  Filled 2011-07-31 (×18): qty 1

## 2011-07-31 MED ORDER — AMITRIPTYLINE HCL 50 MG PO TABS
50.0000 mg | ORAL_TABLET | Freq: Every day | ORAL | Status: DC
  Administered 2011-07-31 – 2011-08-05 (×6): 50 mg via ORAL
  Filled 2011-07-31 (×7): qty 1

## 2011-07-31 MED ORDER — KETOROLAC TROMETHAMINE 0.5 % OP SOLN
1.0000 [drp] | Freq: Two times a day (BID) | OPHTHALMIC | Status: DC
  Administered 2011-07-31 – 2011-08-16 (×33): 1 [drp] via OPHTHALMIC
  Filled 2011-07-31 (×2): qty 3

## 2011-07-31 MED ORDER — GABAPENTIN 100 MG PO CAPS
100.0000 mg | ORAL_CAPSULE | Freq: Two times a day (BID) | ORAL | Status: DC
  Administered 2011-07-31 – 2011-08-06 (×13): 100 mg via ORAL
  Filled 2011-07-31 (×14): qty 1

## 2011-07-31 MED ORDER — MAGIC MOUTHWASH
15.0000 mL | Freq: Four times a day (QID) | ORAL | Status: DC | PRN
  Filled 2011-07-31: qty 15

## 2011-07-31 MED ORDER — INSULIN GLARGINE 100 UNIT/ML ~~LOC~~ SOLN
10.0000 [IU] | Freq: Every day | SUBCUTANEOUS | Status: DC
  Administered 2011-07-31 – 2011-08-04 (×5): 10 [IU] via SUBCUTANEOUS

## 2011-07-31 MED ORDER — PROMETHAZINE HCL 25 MG/ML IJ SOLN
12.5000 mg | Freq: Four times a day (QID) | INTRAMUSCULAR | Status: DC | PRN
  Administered 2011-07-31: 6.25 mg via INTRAVENOUS
  Administered 2011-08-05: 25 mg via INTRAVENOUS
  Filled 2011-07-31 (×2): qty 1

## 2011-07-31 MED ORDER — LISINOPRIL 10 MG PO TABS
10.0000 mg | ORAL_TABLET | Freq: Every day | ORAL | Status: DC
  Administered 2011-07-31 – 2011-08-06 (×7): 10 mg via ORAL
  Filled 2011-07-31 (×8): qty 1

## 2011-07-31 MED ORDER — HYDROMORPHONE HCL PF 1 MG/ML IJ SOLN
0.5000 mg | INTRAMUSCULAR | Status: DC | PRN
  Administered 2011-07-31 – 2011-08-05 (×14): 1 mg via INTRAVENOUS
  Administered 2011-08-07: 2 mg via INTRAVENOUS
  Filled 2011-07-31: qty 1
  Filled 2011-07-31: qty 2
  Filled 2011-07-31 (×13): qty 1

## 2011-07-31 MED ORDER — PSYLLIUM 95 % PO PACK
1.0000 | PACK | Freq: Two times a day (BID) | ORAL | Status: DC
  Filled 2011-07-31 (×2): qty 1

## 2011-07-31 MED ORDER — METOPROLOL TARTRATE 12.5 MG HALF TABLET
12.5000 mg | ORAL_TABLET | Freq: Two times a day (BID) | ORAL | Status: DC | PRN
  Administered 2011-07-31: 12.5 mg via ORAL
  Filled 2011-07-31 (×2): qty 1

## 2011-07-31 MED ORDER — MORPHINE SULFATE 2 MG/ML IJ SOLN
2.0000 mg | INTRAMUSCULAR | Status: DC | PRN
  Administered 2011-07-31 (×2): 2 mg via INTRAVENOUS
  Filled 2011-07-31: qty 1

## 2011-07-31 MED ORDER — ATENOLOL 25 MG PO TABS
25.0000 mg | ORAL_TABLET | Freq: Two times a day (BID) | ORAL | Status: DC
  Administered 2011-07-31 – 2011-08-06 (×13): 25 mg via ORAL
  Filled 2011-07-31 (×14): qty 1

## 2011-07-31 MED ORDER — PANTOPRAZOLE SODIUM 40 MG IV SOLR
40.0000 mg | INTRAVENOUS | Status: DC
  Filled 2011-07-31: qty 40

## 2011-07-31 MED ORDER — DIGOXIN 0.25 MG/ML IJ SOLN
0.1250 mg | Freq: Every day | INTRAMUSCULAR | Status: DC
  Administered 2011-07-31 – 2011-08-04 (×5): 0.125 mg via INTRAVENOUS
  Filled 2011-07-31 (×6): qty 0.5

## 2011-07-31 MED ORDER — LEVOTHYROXINE SODIUM 100 MCG IV SOLR
50.0000 ug | Freq: Every day | INTRAVENOUS | Status: DC
  Filled 2011-07-31: qty 2.5

## 2011-07-31 MED ORDER — SIMVASTATIN 10 MG PO TABS
10.0000 mg | ORAL_TABLET | Freq: Every day | ORAL | Status: DC
  Administered 2011-07-31 – 2011-08-12 (×10): 10 mg via ORAL
  Filled 2011-07-31 (×15): qty 1

## 2011-07-31 MED ORDER — INSULIN ASPART 100 UNIT/ML ~~LOC~~ SOLN
0.0000 [IU] | SUBCUTANEOUS | Status: DC
Start: 2011-07-31 — End: 2011-08-05
  Administered 2011-07-31: 3 [IU] via SUBCUTANEOUS
  Administered 2011-07-31: 2 [IU] via SUBCUTANEOUS
  Administered 2011-07-31: 3 [IU] via SUBCUTANEOUS
  Administered 2011-08-01 (×4): 2 [IU] via SUBCUTANEOUS
  Administered 2011-08-01: 3 [IU] via SUBCUTANEOUS
  Administered 2011-08-01: 2 [IU] via SUBCUTANEOUS
  Administered 2011-08-01: 3 [IU] via SUBCUTANEOUS
  Administered 2011-08-02 – 2011-08-03 (×12): 2 [IU] via SUBCUTANEOUS
  Administered 2011-08-04: 3 [IU] via SUBCUTANEOUS
  Administered 2011-08-04 (×4): 2 [IU] via SUBCUTANEOUS
  Administered 2011-08-05 (×2): 3 [IU] via SUBCUTANEOUS

## 2011-07-31 MED ORDER — LEVOTHYROXINE SODIUM 75 MCG PO TABS
75.0000 ug | ORAL_TABLET | Freq: Every day | ORAL | Status: DC
  Administered 2011-08-01 – 2011-08-06 (×6): 75 ug via ORAL
  Filled 2011-07-31 (×7): qty 1

## 2011-07-31 MED ORDER — KETOROLAC TROMETHAMINE 0.5 % OP SOLN: 1.0000 [drp] | Freq: Four times a day (QID) | OPHTHALMIC | Status: DC

## 2011-07-31 MED ORDER — ALUM & MAG HYDROXIDE-SIMETH 200-200-20 MG/5ML PO SUSP: 30.0000 mL | Freq: Four times a day (QID) | ORAL | Status: DC | PRN

## 2011-07-31 MED ORDER — ATENOLOL 50 MG PO TABS
50.0000 mg | ORAL_TABLET | Freq: Two times a day (BID) | ORAL | Status: DC
  Filled 2011-07-31 (×2): qty 1

## 2011-07-31 NOTE — Evaluation (Addendum)
Physical Therapy Evaluation Patient Details Name: SHANTORIA ELLWOOD MRN: [id number] DOB: 09/14/1950 Today's Date: 07/31/2011 Time: 1115-1202 PT Time Calculation (min): 47 min  PT Assessment / Plan / Recommendation Clinical Impression  [name] is 61 y/o female s/p hernia repair. Mobility affected by pain and general fatigue/weaness following surgery. Will benefit physical therapy in the acute setting to address these and the below problem list so as to maximize her function and independence for safe d/c home. Rec HHPT, RW and 3in1 with supervision for mobility. Pt able to verbalize that she will walk 2-3 x/day with staff to improve overall mobility and activity tolerance.    PT Assessment  Patient needs continued PT services    Follow Up Recommendations  Home health PT;Supervision for mobility/OOB    Barriers to Discharge Decreased caregiver support      lEquipment Recommendations  Rolling walker with 5" wheels;3 in 1 bedside comode    Recommendations for Other Services OT consult   Frequency Min 3X/week    Precautions / Restrictions Precautions Precautions: Fall         Mobility  Bed Mobility Bed Mobility: Rolling Left;Left Sidelying to Sit Rolling Left: 3: Mod assist Left Sidelying to Sit: 3: Mod assist;HOB elevated (30 degrees) Details for Bed Mobility Assistance: cues for technique and facilitatin for follow through Transfers Transfers: Sit to Stand;Stand to Sit;Stand Pivot Transfers Sit to Stand: 4: Min assist;From bed;With upper extremity assist Stand to Sit: To chair/3-in-1;With upper extremity assist;With armrests Stand Pivot Transfers: 4: Min assist Details for Transfer Assistance: moving slowly because of pain; cues for hand placement and assist to follow through to stand; cues for sequencing of transfer bed->3in1 Ambulation/Gait Ambulation/Gait Assistance: 4: Min guard Ambulation Distance (Feet): 60 Feet Assistive device: Rolling walker Ambulation/Gait  Assistance Details: slow and gaurded gait pattern Gait Pattern: Decreased stride length;Trunk flexed;Shuffle    Exercises     PT Diagnosis: Difficulty walking;Abnormality of gait;Generalized weakness;Acute pain  PT Problem List: Decreased activity tolerance;Pain;Decreased mobility PT Treatment Interventions: DME instruction;Gait training;Functional mobility training;Therapeutic activities;Therapeutic exercise;Balance training;Neuromuscular re-education;Patient/family education;Stair training   PT Goals Acute Rehab PT Goals PT Goal Formulation: With patient Time For Goal Achievement: 08/14/11 Pt will Roll Supine to Right Side: with modified independence PT Goal: Rolling Supine to Right Side - Progress: Goal set today Pt will Roll Supine to Left Side: with modified independence PT Goal: Rolling Supine to Left Side - Progress: Goal set today Pt will go Supine/Side to Sit: with modified independence PT Goal: Supine/Side to Sit - Progress: Goal set today Pt will go Sit to Supine/Side: with modified independence PT Goal: Sit to Supine/Side - Progress: Goal set today Pt will go Sit to Stand: with modified independence PT Goal: Sit to Stand - Progress: Goal set today Pt will go Stand to Sit: with modified independence PT Goal: Stand to Sit - Progress: Goal set today Pt will Transfer Bed to Chair/Chair to Bed: with modified independence PT Transfer Goal: Bed to Chair/Chair to Bed - Progress: Goal set today Pt will Ambulate: >150 feet;with modified independence;with least restrictive assistive device PT Goal: Ambulate - Progress: Goal set today Pt will Go Up / Down Stairs: 3-5 stairs;with min assist;with least restrictive assistive device PT Goal: Up/Down Stairs - Progress: Goal set today Pt will Perform Home Exercise Program: Independently PT Goal: Perform Home Exercise Program - Progress: Goal set today  Visit Information  Last PT Received On: 07/31/11 Assistance Needed: +2 (+2 helpful)     Subjective Data  Subjective:  Im not doing too great.  Patient Stated Goal: home   Prior Functioning  Home Living Lives With: Alone Available Help at Discharge: Family;Available PRN/intermittently Type of Home: House Home Access: Stairs to enter [employer] of Steps: 3 Entrance Stairs-Rails: None Home Layout: Laundry or work area in basement;Able to live on main level with bedroom/bathroom Bathroom Shower/Tub: Health visitor: Engineer, mining: Paediatric nurse with back Prior Function Level of Independence: Independent Vocation: On disability Communication Communication: No difficulties    Cognition  Overall Cognitive Status: Appears within functional limits for tasks assessed/performed Arousal/Alertness: Awake/alert Orientation Level: Appears intact for tasks assessed Behavior During Session: [facility] for tasks performed    Extremity/Trunk Assessment Right Upper Extremity Assessment RUE ROM/Strength/Tone: Within functional levels RUE Sensation: WFL - Light Touch;WFL - Proprioception RUE Coordination: WFL - gross/fine motor Left Upper Extremity Assessment LUE ROM/Strength/Tone: Within functional levels LUE Sensation: WFL - Light Touch;WFL - Proprioception LUE Coordination: WFL - gross/fine motor Right Lower Extremity Assessment RLE ROM/Strength/Tone: Within functional levels RLE Sensation: WFL - Light Touch;WFL - Proprioception RLE Coordination: WFL - gross/fine motor Left Lower Extremity Assessment LLE ROM/Strength/Tone: Within functional levels LLE Sensation: WFL - Proprioception;WFL - Light Touch LLE Coordination: WFL - gross/fine motor   Balance Static Sitting Balance Static Sitting - Balance Support: Bilateral upper extremity supported Static Sitting - Level of Assistance: 5: Stand by assistance Dynamic Standing Balance Dynamic Standing - Balance Support: No upper extremity supported Dynamic Standing - Level of Assistance: 5:  Stand by assistance Dynamic Standing - Comments: performed pericare with SBA and no upper extremity support  End of Session PT - End of Session Equipment Utilized During Treatment: Gait belt Activity Tolerance: Patient tolerated treatment well Patient left: in chair;with call bell/phone within reach Nurse Communication: Mobility status;Patient requests pain meds   WHITLOW, [name] 07/31/2011, 12:31 PM

## 2011-07-31 NOTE — Consult Note (Signed)
Hospital consult Note   Date: 07/31/2011  Patient name:  [name]  Medical record number:  [id number] Date of birth:  20-Sep-1950  Age: 61 y.o. Gender:  female PCP:    Melida Quitter, MD, MD  Medical Service:   Internal Medicine Teaching Service   Medical Service: [place]  Attending physician: Dr. Rogelia Boga    1st Contact:     Dr. Dierdre Searles                Pager: [phone] 2nd Contact:    Dr. Allena Katz                   Pager: [phone]  After 5 pm or weekends: 1st Contact:      Pager: [phone] 2nd Contact:      Pager: [phone]    Chief Complaint:  Abdominal pain and nausea  History of Present Illness: Patient is a 61 y.o. female with a PMHx as noted in the chart most significant for diabetes type 2, hypertension and atrial fibrillation who was was admitted on May 22 by [place] [facility] for abdominal hernia repair. Patient had abdominal repair laparoscopically on May 22 and today is postop day 2. Patient started complaining of some upper abdominal pain and nausea at about 4 in the morning today. Pain was described as discomfort, moderate to severe in intensity, nonradiating, burning in character, without any excessive bleeding or relieving factors. Patient was given additional doses of morphine which made her more nauseated. Rapid response nurse was called and an EKG was done bedside which was unchanged from her previous EKG. Patient was given Phenergan at this time and felt better. The patient was made n.p.o., NG tube was inserted and abdominal imaging studies were ordered by surgery. Dr. Derrell Lolling also requested internal medicine consult for management of her medical problems.  Patient denies any chest pain at this time. She states that she has been passing a lot of flatus and is on bedpan currently. She denies any shortness of breath, orthopnea, PND or swelling in legs. She does complain of some abdominal pain which is much better as compared to what it was this morning. No other complaints at  this time.   Current Outpatient Medications: Prior to Admission medications   Medication Sig Start Date End Date Taking? Authorizing Provider  ALPRAZolam Prudy Feeler) 0.5 MG tablet Take 1 tablet (0.5 mg total) by mouth at bedtime as needed for sleep or anxiety. 07/23/11 07/22/12 Yes Amanjot Sidhu, MD  amitriptyline (ELAVIL) 50 MG tablet Take 50 mg by mouth at bedtime. 05/08/11 05/07/12 Yes Amanjot Sidhu, MD  atenolol (TENORMIN) 25 MG tablet Take 25 mg by mouth 2 (two) times daily. 05/08/11 05/07/12 Yes Amanjot Sidhu, MD  B Complex-Biotin-FA (SUPER B-50 B COMPLEX PO) Take 1 tablet by mouth 2 (two) times daily.    Yes Historical Provider, MD  Calcium Carbonate-Vitamin D (CALCIUM 600+D) 600-400 MG-UNIT per tablet Take 1 tablet by mouth 2 (two) times daily with a meal.    Yes Historical Provider, MD  cyclobenzaprine (FLEXERIL) 5 MG tablet Take 5 mg by mouth 2 (two) times daily as needed. For fibromyalgia 05/08/11  Yes Melida Quitter, MD  digoxin (LANOXIN) 0.25 MG tablet Take 250 mcg by mouth daily. 05/08/11 06/07/12 Yes Amanjot Sidhu, MD  diltiazem (DILACOR XR) 180 MG 24 hr capsule Take 180 mg by mouth daily. 05/08/11 05/07/12 Yes Amanjot Sidhu, MD  enoxaparin (LOVENOX) 120 MG/0.8ML injection Inject 0.8 mLs (120 mg total) into the skin daily. Take as directed by  coumadin clinic, inject daily pre op briding as directed 07/20/11  Yes Tonny Bollman, MD  fish oil-omega-3 fatty acids 1000 MG capsule Take 1 g by mouth 2 (two) times daily.    Yes Historical Provider, MD  FLUoxetine (PROZAC) 20 MG capsule Take 20 mg by mouth daily. 05/08/11 05/07/12 Yes Amanjot Sidhu, MD  furosemide (LASIX) 40 MG tablet Take 40 mg by mouth daily.   Yes Historical Provider, MD  gabapentin (NEURONTIN) 100 MG capsule Take 100 mg by mouth 2 (two) times daily. 05/08/11  Yes Amanjot Sidhu, MD  glipiZIDE (GLUCOTROL) 10 MG tablet Take 10 mg by mouth 2 (two) times daily before a meal.   Yes Historical Provider, MD  HYDROcodone-acetaminophen (VICODIN) 5-500 MG per  tablet Take 1 tablet by mouth every 12 (twelve) hours as needed. For pain 05/08/11  Yes Melida Quitter, MD  levothyroxine (SYNTHROID, LEVOTHROID) 75 MCG tablet Take 1 tablet (75 mcg total) by mouth daily. 05/08/11  Yes Melida Quitter, MD  lisinopril (PRINIVIL,ZESTRIL) 10 MG tablet Take 1 tablet (10 mg total) by mouth daily. 05/08/11 05/07/12 Yes Melida Quitter, MD  metFORMIN (GLUCOPHAGE) 1000 MG tablet Take 1 tablet (1,000 mg total) by mouth 2 (two) times daily with a meal. 05/08/11 05/07/12 Yes Amanjot Sidhu, MD  Multiple Vitamin (MULTIVITAMIN) capsule Take 1 capsule by mouth daily.    Yes Historical Provider, MD  niacin (NIASPAN) 500 MG CR tablet Take 2 tablets (1,000 mg total) by mouth 2 (two) times daily. 04/23/11 04/22/12 Yes Amanjot Sidhu, MD  omeprazole (PRILOSEC) 20 MG capsule Take 40 mg by mouth as needed. For acid reflux 02/03/11 02/03/12 Yes Melida Quitter, MD  potassium chloride (K-DUR) 10 MEQ tablet Take 10 mEq by mouth daily. 05/08/11 05/07/12 Yes Amanjot Sidhu, MD  pravastatin (PRAVACHOL) 80 MG tablet Take 80 mg by mouth daily.   Yes Historical Provider, MD  albuterol (PROVENTIL HFA;VENTOLIN HFA) 108 (90 BASE) MCG/ACT inhaler Inhale 2 puffs into the lungs every 6 (six) hours as needed. For shortness of breath 02/03/11 03/04/12  Melida Quitter, MD  warfarin (COUMADIN) 5 MG tablet Take 2.5-5 mg by mouth daily. Takes 5 mg on Monday Wednesday and Friday. Takes 2.5mg  on Tuesday, Thursday, Saturday, and Sunday. 05/08/11   Melida Quitter, MD     Allergies: Gemfibrozil; Latex; Penicillins; Adhesive; and Codeine  Past Medical History: Past Medical History  Diagnosis Date  . Depression   . Obesity   . Skin neoplasm   . Bunion   . Carotid stenosis   . Fibromyalgia   . Internal hemorrhoid   . GERD (gastroesophageal reflux disease)   . Chronic gastritis   . Transaminase or LDH elevation   . Mild cognitive impairment   . Hyperlipidemia   . Fatty liver   . Lumbar back pain   . Diabetic peripheral neuropathy    . Diverticulosis   . Esophageal stricture   . Hypertension   . Cholecystitis     s/p cholecystectomy  . Sarcoidosis   . Fibromyalgia   . CHF (congestive heart failure)   . Skin cancer 1990's    "front of my right leg"  . Atrial fibrillation   . OSA (obstructive sleep apnea) 2003    "can't sleep w/that darm machine"  . Exertional dyspnea   . Hypothyroidism   . Type II diabetes mellitus   . TIA (transient ischemic attack)     07/29/11 pt denies this histor  . H/O hiatal hernia   . Epileptic seizure, tonic     .  No meds since age of 40.  . Osteoarthritis     Past Surgical History: Past Surgical History  Procedure Date  . Hiatal hernia repair 1990    "had to have scar tissue removed 6 months after repair"  . Esophageal dilation   . Cataract extraction w/phaco 10/27/2010    Procedure: CATARACT EXTRACTION PHACO AND INTRAOCULAR LENS PLACEMENT (IOC);  Surgeon: Susa Simmonds;  Location: [facility];  Service: Ophthalmology;  Laterality: Left;  CDE- 1.78  . Cataract extraction w/phaco 07/13/2011    Procedure: CATARACT EXTRACTION PHACO AND INTRAOCULAR LENS PLACEMENT (IOC);  Surgeon: Susa Simmonds, MD;  Location: [facility];  Service: Ophthalmology;  Laterality: Right;  CDE:  1.65  . Liposuction 1992  . Inverted nipples 1992  . Breast surgery ~ 2010    excision milk duct; right breast  . Foot surgery ~ 2011    ~straightened toe left foot & scraped bone below big toe"  . Foot surgery ~ 2011    "scraped bone of big toe; shortened middle toe; right foot"  . Cholecystectomy 1980's  . Dilation and curettage of uterus 1970; 1976  . Hernia repair 07/29/11    multiple incarcerated VHR  . Inguinal hernia repair 1990's    left  . Abdominal hysterectomy 1980's    partial  . Tubal ligation 1976  . Bilateral oophorectomy 1980's    "after partial hysterectomy"  . Skin cancer excision 1990's    "front side of right shin"  . Ventral hernia repair 07/29/2011    Procedure: LAPAROSCOPIC VENTRAL  HERNIA;  Surgeon: Ernestene Mention, MD;  Location: [facility] OR;  Service: General;  Laterality: N/A;  multiple incarcerated hernias with mesh    Family History: Family History  Problem Relation Age of Onset  . Crohn's disease Mother   . Colitis Mother     Crohns  . Anesthesia problems Mother   . Cancer Father   . Diabetes Father   . Heart disease Father   . Melanoma Father   . Diabetes Sister   . Depression Maternal Uncle   . Dementia Maternal Uncle   . Hypotension Neg Hx   . Malignant hyperthermia Neg Hx   . Pseudochol deficiency Neg Hx     Social History: History   Social History  . Marital Status: Divorced    Spouse Name: N/A    Number of Children: N/A  . Years of Education: N/A   Occupational History  . Not on file.   Social History Main Topics  . Smoking status: Never Smoker   . Smokeless tobacco: Never Used  . Alcohol Use: No  . Drug Use: No  . Sexually Active: Not Currently   Other Topics Concern  . Not on file   Social History Narrative   Divorced, on disability    Review of Systems: Pertinent items are noted in HPI.  Vital Signs: BP 157/104  Pulse 106  Temp(Src) 97.9 F (36.6 C) (Oral)  Resp 20  Ht 5\' 3"  (1.6 m)  Wt 193 lb 14.4 oz (87.952 kg)  BMI 34.35 kg/m2  SpO2 98%  LMP 06/03/1978   Physical Exam: General: Vital signs reviewed and noted. Well-developed, well-nourished, in no acute distress; alert, appropriate and cooperative throughout examination.  Head: Normocephalic, atraumatic.  Eyes: PERRL, EOMI, No signs of anemia or jaundince.  Ears: TM nonerythematous, not bulging, good light reflex bilaterally.  Nose: Mucous membranes moist, not inflammed, nonerythematous.  Throat: Oropharynx nonerythematous, no exudate appreciated.   Neck: No deformities,  masses, or tenderness noted.Supple, No carotid Bruits, no JVD.  Lungs:  Normal respiratory effort. Clear to auscultation BL without crackles or wheezes.  Heart:  irregularly irregular rhythm.  S1 and S2 normal without gallop, murmur, or rubs.  Abdomen:  BS normoactive. Soft, slightly distended, slightly tender to touch, no rebound or guarding noted.  No masses or organomegaly. multiple Small incisions which are in various stages of healing   Extremities: No pretibial edema.  Neurologic: A&O X3, CN II - XII are grossly intact. Motor strength is 5/5 in the all 4 extremities, Sensations intact to light touch, Cerebellar signs negative.  Skin: No visible rashes, scars.   Lab results: CBC:    Component Value Date/Time   WBC 16.8* 07/31/2011 0657   HGB 15.1* 07/31/2011 0657   HCT 44.5 07/31/2011 0657   PLT 277 07/31/2011 0657   MCV 94.5 07/31/2011 0657   NEUTROABS 13.2* 07/31/2011 0657   LYMPHSABS 2.3 07/31/2011 0657   MONOABS 1.2* 07/31/2011 0657   EOSABS 0.0 07/31/2011 0657   BASOSABS 0.0 07/31/2011 0657      Comprehensive Metabolic Panel:    Component Value Date/Time   NA 138 07/31/2011 0657   K 4.0 07/31/2011 0657   CL 101 07/31/2011 0657   CO2 26 07/31/2011 0657   BUN 6 07/31/2011 0657   CREATININE 0.44* 07/31/2011 0657   CREATININE 0.75 05/08/2011 1547   GLUCOSE 240* 07/31/2011 0657   CALCIUM 10.1 07/31/2011 0657   AST 26 07/20/2011 1352   ALT 31 07/20/2011 1352   ALKPHOS 76 07/20/2011 1352   BILITOT 0.6 07/20/2011 1352   PROT 6.7 07/20/2011 1352   ALBUMIN 3.5 07/20/2011 1352     Lab Results  Component Value Date   CKTOTAL 84 07/31/2011   CKMB 2.7 07/31/2011   TROPONINI <0.30 07/31/2011      Imaging results:  AXR 2 View 5/24 IMPRESSION:  1. Overall, the bowel gas pattern as detailed above is most  consistent with a postoperative ileus.  2. No pneumoperitoneum at this time.  3. Status post cholecystectomy.  CXR 5/24 No acute abnormality  Assessment & Plan:  #1 status post abdominal hernia repair #2  HYPOTHYROIDISM  #3  DIABETES MELLITUS, TYPE II  #4  HYPERLIPIDEMIA  #5  DEPRESSION  #6  HYPERTENSION  #7  ATRIAL FIBRILLATION  #8  GERD/ESOPHAGEAL STRICTURE  #9   OSTEOARTHRITIS  #10  FIBROMYALGIA, SEVERE   It seems that patient has post operative ileus as per the abdominal x-ray which explains some of her abdominal pain. Patient is currently has an NG tube to suction and n.p.o. we will follow surgery recommendations for management of postoperative ileus. We would also start physical therapy and have her move around with help to facilitate discharge.  Restart patient's home thyroid medication.  Patient's CBGs are elevated. I will add basal insulin 10 units Lantus first dose now. Follow CBGs tomorrow morning.  Restart home medications for hyperlipidemia.  Restart home medications for depression  Restart home medications for hypertension  Agree with surgery to stop Coumadin at this time in view of hospital urgent need for intervention. Although patient should be started on subcutaneous Lovenox for DVT prophylaxis.  Continue home medications for reflux disease.   DVT PPX: Lovenox SQ  Lars Mage MD [facility] Internal Medicine Resident Pager [phone] 10:42 AM

## 2011-07-31 NOTE — Progress Notes (Signed)
0401 Patient was given vicodin 2 tabs for c/o pain. 1610 Patient c/o nausea zofran 4mg  given. 0430 Patient c/o chest pain P 104 b/p 164/104 O2 sat 92. Pt did c/o sob O2 started at 2l/m [place]. 0442 Dr Michaell Cowing notified of patient c/o chest pain and elevated b/p. Morphine 2mg  given at 0443.0500 Rapid reponse nurse up to look at ekg. Rapid reponse nurse stated their was no change from previous EKG. Patient still c/o chest pain and pressure.0518 Morphine 2mg  given IV and metoprolol 12.5mg  given. B/p 162/102 P 106. 0540 Patient resting. 9604 Patient awake c/o chest pressure and nausea.5409 Phenergan 6.25mg  given IV. 8119 Dr Derrell Lolling in to see patient. B/p  156/90 hr 107. 0640 Patient is resting will continue to monitor.

## 2011-07-31 NOTE — Progress Notes (Addendum)
2 Days Post-Op  Subjective: Has been having problems with upper abdominal pain and nausea last 8 hours. Passing " plenty" of  flatus but no stool. Voiding normally. Just can't get comfortable despite multiple doses of morphine. Vital signs stable. Heart rate 100. EKG done and reviewed by rapid response seen and apparently no change from pre-op.  Objective: Vital signs in last 24 hours: Temp:  [97.3 F (36.3 C)-98.5 F (36.9 C)] 98.4 F (36.9 C) (05/24 0230) Pulse Rate:  [85-104] 104  (05/24 0517) Resp:  [17-18] 18  (05/24 0230) BP: (117-164)/(50-104) 162/102 mmHg (05/24 0517) SpO2:  [92 %-96 %] 92 % (05/24 0426) Last BM Date: 07/29/11  Intake/Output from previous day: 05/23 0701 - 05/24 0700 In: 360 [P.O.:360] Out: 2150 [Urine:2150] Intake/Output this shift: Total I/O In: 120 [P.O.:120] Out: 650 [Urine:650]  General appearance: alert. Mental status at baseline. Mild to moderate anxiety. Appropriate. moderate distress. Color good. No diaphoresis.  Resp: clear to auscultation bilaterally. Doesn;t appear dyspneic. GI: abdomen is distended with rare, hypoactive bowel sounds. Mild diffuse tenderness. No peritoneal signs. Wounds looked okay.  Lab Results:  Results for orders placed during the hospital encounter of 07/29/11 (from the past 24 hour(s))  BASIC METABOLIC PANEL     Status: Abnormal   Collection Time   07/30/11  7:10 AM      Component Value Range   Sodium 141  135 - 145 (mEq/L)   Potassium 4.1  3.5 - 5.1 (mEq/L)   Chloride 102  96 - 112 (mEq/L)   CO2 28  19 - 32 (mEq/L)   Glucose, Bld 178 (*) 70 - 99 (mg/dL)   BUN 6  6 - 23 (mg/dL)   Creatinine, Ser 1.61  0.50 - 1.10 (mg/dL)   Calcium 9.5  8.4 - 09.6 (mg/dL)   GFR calc non Af Amer >90  >90 (mL/min)   GFR calc Af Amer >90  >90 (mL/min)  CBC     Status: Abnormal   Collection Time   07/30/11  7:10 AM      Component Value Range   WBC 12.7 (*) 4.0 - 10.5 (K/uL)   RBC 3.92  3.87 - 5.11 (MIL/uL)   Hemoglobin 12.6  12.0 -  15.0 (g/dL)   HCT 04.5  40.9 - 81.1 (%)   MCV 93.4  78.0 - 100.0 (fL)   MCH 32.1  26.0 - 34.0 (pg)   MCHC 34.4  30.0 - 36.0 (g/dL)   RDW 91.4  78.2 - 95.6 (%)   Platelets 252  150 - 400 (K/uL)  PROTIME-INR     Status: Normal   Collection Time   07/30/11  7:10 AM      Component Value Range   Prothrombin Time 13.2  11.6 - 15.2 (seconds)   INR 0.98  0.00 - 1.49   GLUCOSE, CAPILLARY     Status: Abnormal   Collection Time   07/30/11  7:20 AM      Component Value Range   Glucose-Capillary 162 (*) 70 - 99 (mg/dL)   Comment 1 Notify RN    GLUCOSE, CAPILLARY     Status: Abnormal   Collection Time   07/30/11 12:45 PM      Component Value Range   Glucose-Capillary 103 (*) 70 - 99 (mg/dL)   Comment 1 Notify RN    GLUCOSE, CAPILLARY     Status: Abnormal   Collection Time   07/30/11  5:34 PM      Component Value Range   Glucose-Capillary  166 (*) 70 - 99 (mg/dL)  GLUCOSE, CAPILLARY     Status: Abnormal   Collection Time   07/30/11  9:41 PM      Component Value Range   Glucose-Capillary 158 (*) 70 - 99 (mg/dL)   Comment 1 Documented in Chart     Comment 2 Notify RN       Studies/Results: @RISRSLT24 @     . acetaminophen  650 mg Oral QID  . amitriptyline  50 mg Oral QHS  . antiseptic oral rinse  15 mL Mouth Rinse q12n4p  . atenolol  50 mg Oral BID  . bisacodyl  10 mg Rectal Daily  . chlorhexidine  15 mL Mouth Rinse BID  . digoxin  250 mcg Oral Daily  . diltiazem  180 mg Oral Daily  . diphenhydrAMINE      . diphenhydrAMINE  25 mg Intravenous Once  . enoxaparin (LOVENOX) injection  120 mg Subcutaneous Q24H  . FLUoxetine  20 mg Oral Daily  . furosemide  40 mg Oral Daily  . gabapentin  100 mg Oral BID  . glipiZIDE  10 mg Oral BID AC  . insulin aspart  0-20 Units Subcutaneous TID WC  . insulin aspart  0-5 Units Subcutaneous QHS  . levothyroxine  75 mcg Oral QAC breakfast  . lisinopril  10 mg Oral Daily  . metFORMIN  1,000 mg Oral BID WC  . niacin  1,000 mg Oral BID  .  pantoprazole  40 mg Oral Q1200  . potassium chloride  10 mEq Oral Daily  . psyllium  1 packet Oral BID  . simvastatin  10 mg Oral q1800  . warfarin  7.5 mg Oral ONCE-1800  . Warfarin - Pharmacist Dosing Inpatient   Does not apply q1800  . DISCONTD: atenolol  25 mg Oral BID  . DISCONTD: cyclobenzaprine  5 mg Oral q12n4p  . DISCONTD: heparin  5,000 Units Subcutaneous Q8H  . DISCONTD: insulin aspart  0-15 Units Subcutaneous TID WC  . DISCONTD: insulin aspart  0-5 Units Subcutaneous QHS  . DISCONTD: niacin  1,000 mg Oral BID     Assessment/Plan: s/p Procedure(s): LAPAROSCOPIC VENTRAL HERNIA INSERTION OF MESH  POD #2. Abdominal distention, pain, nausea. This will need further evaluation today. This may simply be a postop ileus. Less likely small bowel obstruction or intestinal complication. Had extensive but uneventful lysis of adhesions. We'll make n.p.o. except meds, check abdominal films and labs now. Switch pain medication from morphine to Dilaudid in hopes of better pain relief. Went to sleep after 6.25 Phenergan IV. Hold Coumadin and Lovenox until evaluation complete.  Chronic atrial fibrillation on Coumadin Resume subcutaneous heparin and hold Coumadin and Lovenox for now. Upon discharge will follow Dr. Jenene Slicker recommendation regarding Lovenox and Coumadin bridging  Non-insulin-dependent diabetes mellitus. Stable. On revised sliding-scale insulin.   Monitor CBCs Q4h.   Hypertension.  Fair control.  Anxiety and depression. On  Prozac. We will need to be careful to avoid oversedation. Stop prozac, elavil and norco.    Patient Active Hospital Problem List: No active hospital problems.   LOS: 2 days    [name] M. Derrell Lolling, M.D., [facility] General and Minimally invasive Surgery Breast and Colorectal Surgery Office:   [phone] Pager:   [phone]  07/31/2011  . .prob

## 2011-08-01 DIAGNOSIS — K439 Ventral hernia without obstruction or gangrene: Secondary | ICD-10-CM

## 2011-08-01 DIAGNOSIS — E785 Hyperlipidemia, unspecified: Secondary | ICD-10-CM

## 2011-08-01 DIAGNOSIS — I4891 Unspecified atrial fibrillation: Secondary | ICD-10-CM

## 2011-08-01 HISTORY — DX: Ventral hernia without obstruction or gangrene: K43.9

## 2011-08-01 LAB — BASIC METABOLIC PANEL
BUN: 8 mg/dL (ref 6–23)
CO2: 31 mEq/L (ref 19–32)
Calcium: 9.9 mg/dL (ref 8.4–10.5)
Chloride: 99 mEq/L (ref 96–112)
GFR calc Af Amer: >90 mL/min (ref >90)
GFR calc non Af Amer: >90 mL/min (ref >90)
Sodium: 138 mEq/L (ref 135–145)

## 2011-08-01 LAB — GLUCOSE, CAPILLARY
Glucose-Capillary: 184 mg/dL — ABNORMAL HIGH (ref 70–99)
Glucose-Capillary: 149 mg/dL — ABNORMAL HIGH (ref 70–99)
Glucose-Capillary: 134 mg/dL — ABNORMAL HIGH (ref 70–99)
Glucose-Capillary: 143 mg/dL — ABNORMAL HIGH (ref 70–99)
Glucose-Capillary: 143 mg/dL — ABNORMAL HIGH (ref 70–99)

## 2011-08-01 LAB — PROTIME-INR
INR: 1.11 (ref 0.00–1.49)
Prothrombin Time: 14.5 seconds (ref 11.6–15.2)

## 2011-08-01 LAB — CBC
Hemoglobin: 14.7 g/dL (ref 12.0–15.0)
MCH: 32 pg (ref 26.0–34.0)
Platelets: 247 10*3/uL (ref 150–400)
RBC: 4.59 MIL/uL (ref 3.87–5.11)
WBC: 13.9 10*3/uL — ABNORMAL HIGH (ref 4.0–10.5)

## 2011-08-01 MED ORDER — LIDOCAINE VISCOUS 2 % MT SOLN
20.0000 mL | OROMUCOSAL | Status: DC | PRN
Start: 2011-08-01 — End: 2011-08-02
  Administered 2011-08-01 – 2011-08-02 (×3): 20 mL via OROMUCOSAL
  Filled 2011-08-01 (×4): qty 20

## 2011-08-01 NOTE — Consult Note (Signed)
Internal Medicine Teaching Service Attending Note Date: 08/01/2011  Patient name: [name]  Medical record number: [id number]  Date of birth: 03-06-1951   I have seen and evaluated Annita Brod and discussed their care with the Residency Team. Please see Dr Sumner Boast consult note for full details. I agree with the formulated Assessment and Plan with the following changes:   Ms [name] was admitted on the 22nd for an elective ventral hernia repair. She developed a post op ileus that manifested with upper abd pain, nausea and tx with NG, NPO, and anti-emetics. She is still feeling poorly and has left ear pain, throat irritation from the NG, ABD distension, ABD pain, and nausea.   On exam, she is in NAD and her vitals are stable with mild tachycardia. Her ABD is distended but soft and mildly tender. She has minimal BS in the RLL. Incisions are well healed.   Her leukocytosis is decreasing, from 16.8 to 14. Her electrolytes are nl. CE x 1 normal.  ABD film was reviewed and is c/w ileus.  A/P 1. Post-op ileus - Surgery is primarily managing this issue - NPO except meds, NG, anti-emetics. Will add lidocain or chloraseptic to help throat irritation.   2. Hypothyroidism - TSH nl. Will continue currect dose of synthroid.  3. DM - Hold Po meds and utilize Lantus. CBG responded - better in mif 100's.  4. A Fib - daily risk of embolic dz is low so can hold warfarin for now. Resume once ileus resolved.   5. DVT prophylaxis - lovenox.

## 2011-08-01 NOTE — Progress Notes (Signed)
Subjective: Pt feels a little better. Has some nausea, but no vomiting. Is passing flatus, but no BM. Had nothing to eat since 5/21. Fells abdominal distention is improved.  Objective: Vital signs in last 24 hours: Filed Vitals:   07/31/11 1810 07/31/11 1826 07/31/11 2213 08/01/11 0534  BP: 150/89  157/89 137/74  Pulse: 127 96 110 114  Temp: 98.2 F (36.8 C)  97.8 F (36.6 C) 98.2 F (36.8 C)  TempSrc: Oral  Oral Oral  Resp: 19  18 16   Height:      Weight:      SpO2: 95%  95% 94%   Weight change:   Intake/Output Summary (Last 24 hours) at 08/01/11 0950 Last data filed at 08/01/11 0500  Gross per 24 hour  Intake   1288 ml  Output   4290 ml  Net  -3002 ml   General: resting in bed comfortable HEENT: PERRL, EOMI, no scleral icterus. NG suction in place Cardiac: RRR, no rubs, murmurs or gallops Pulm: clear to auscultation bilaterally, moving normal volumes of air Abd: soft, nontender, mildly distended, hypoactive BS. Ext: warm and well perfused, no pedal edema Neuro: alert and oriented X3, cranial nerves II-XII grossly intact  Lab Results: Basic Metabolic Panel:  Lab 08/01/11 1610 07/31/11 0657  NA 138 138  K 4.6 4.0  CL 99 101  CO2 31 26  GLUCOSE 165* 240*  BUN 8 6  CREATININE 0.59 0.44*  CALCIUM 9.9 10.1  MG -- --  PHOS -- --   CBC:  Lab 08/01/11 0655 07/31/11 0657  WBC 13.9* 16.8*  NEUTROABS -- 13.2*  HGB 14.7 15.1*  HCT 43.9 44.5  MCV 95.6 94.5  PLT 247 277   Cardiac Enzymes:  Lab 07/31/11 0657  CKTOTAL 84  CKMB 2.7  CKMBINDEX --  TROPONINI <0.30   CBG:  Lab 08/01/11 0738 08/01/11 0404 07/31/11 2350 07/31/11 1954 07/31/11 1636 07/31/11 1207  GLUCAP 149* 184* 188* 157* 145* 158*   Hemoglobin A1C:  Lab 07/29/11 1422  HGBA1C 6.3*   Thyroid Function Tests:  Lab 07/31/11 1016  TSH 0.756  T4TOTAL --  FREET4 --  T3FREE --  THYROIDAB --   Coagulation:  Lab 08/01/11 0655 07/31/11 0604 07/30/11 0710 07/29/11 0644  LABPROT 14.5 13.3  13.2 13.7  INR 1.11 0.99 0.98 1.03   Studies/Results: Dg Chest Port 1 View  07/31/2011  *RADIOLOGY REPORT*  Clinical Data: Chest pain.  Hernia repair yesterday.  PORTABLE CHEST - 1 VIEW  Comparison: Chest x-ray 07/22/2011.  Findings: Lung volumes are low.  There are linear bibasilar opacities, most compatible with areas of postoperative subsegmental atelectasis.  Mild crowding of the vasculature is noted without frank pulmonary edema.  Heart size is borderline enlarged, likely accentuated by low lung volumes. The patient is rotated to the right on today's exam, resulting in distortion of the mediastinal contours and reduced diagnostic sensitivity and specificity for mediastinal pathology.  Atherosclerotic calcifications within the arch of the aorta.  IMPRESSION: 1.  Low lung volumes with bibasilar postoperative atelectasis. 2.  Borderline cardiomegaly. 3.  Atherosclerosis.  Original Report Authenticated By: Florencia Reasons, M.D.   Dg Abd 2 Views  07/31/2011  *RADIOLOGY REPORT*  Clinical Data: Chest pain and abdominal distension.  Recent abdominal surgery.  PORTABLE CHEST - 1 VIEW  Comparison: No priors.  Findings: There is severe distension of the stomach with gas. Numerous dilated loops of gas-filled small bowel is seen throughout the entire abdomen measuring up to 4.7 cm in  diameter.  However, there is gas and stool throughout the colon extending to the level of the distal sigmoid colon.  No rectal gas is clearly identified. No definite pneumoperitoneum is identified.  Surgical clips project over the right upper quadrant of the abdomen, consistent with the patient's history of prior cholecystectomy.  IMPRESSION:  1.  Overall, the bowel gas pattern as detailed above is most consistent with a postoperative ileus.  2.  No pneumoperitoneum at this time.  3.  Status post cholecystectomy.  Original Report Authenticated By: Florencia Reasons, M.D.   Medications: I have reviewed the patient's current  medications. Scheduled Meds:   . amitriptyline  50 mg Oral QHS  . antiseptic oral rinse  15 mL Mouth Rinse q12n4p  . atenolol  25 mg Oral BID  . bisacodyl  10 mg Rectal Daily  . chlorhexidine  15 mL Mouth Rinse BID  . digoxin  0.125 mg Intravenous Daily  . diltiazem  180 mg Oral Daily  . enoxaparin (LOVENOX) injection  40 mg Subcutaneous Q24H  . FLUoxetine  20 mg Oral Daily  . gabapentin  100 mg Oral BID  . insulin aspart  0-15 Units Subcutaneous Q4H  . insulin glargine  10 Units Subcutaneous QHS  . ketorolac  1 drop Right Eye BID  . levothyroxine  75 mcg Oral QAC breakfast  . lisinopril  10 mg Oral Daily  . pantoprazole  40 mg Oral Q1200  . simvastatin  10 mg Oral q1800  . DISCONTD: heparin subcutaneous  5,000 Units Subcutaneous Q8H  . DISCONTD: insulin aspart  0-20 Units Subcutaneous Q4H  . DISCONTD: ketorolac  1 drop Both Eyes QID  . DISCONTD: levothyroxine  50 mcg Intravenous Daily  . DISCONTD: metoprolol  5 mg Intravenous Q6H  . DISCONTD: pantoprazole (PROTONIX) IV  40 mg Intravenous Q24H   Continuous Infusions:   . dextrose 5 % and 0.45 % NaCl with KCl 20 mEq/L 100 mL/hr at 08/01/11 0645   PRN Meds:.albuterol, HYDROmorphone (DILAUDID) injection, magic mouthwash, ondansetron (ZOFRAN) IV, promethazine Assessment/Plan:  1) Post Op ileus- Ventral hernia s/p laparoscopic lysis of adhesions and hernia repair with mesh 07/29/11:  Slowly resolving. Nausea a little better. No vomiting. Abdominal pain also improved. No BM, although has flatus. - WBC trending down ( likely reactive) , no electrolyte abnormalities. AXR yesterday showed ileus.  - Continue NG suction and NPO except meds as per surgery.  2) Afib: Pt mildly tachycardic this am. Although asymptomatic. - Coumadin on hold- will consider restarting soon- likely tomorrow if her ileus improves.  3) DM2: Well controlled. A1c- 6.3. - Continue Lantus at 10 units and SSI. CBG's mildly elevated, likely due to acute stress from  surgery.  4) Hypothyroidism: TSH- 0.756. Normal. -Continue synthriod.  5) Hypertension: BP well controlled- mildly elevated at times. - Continue Lisinopril and Atenolol.  6) Hx of TIA/CVA: Unclear when pt had it. She refuses any Hx though.  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 This was discussed by Dr. Dierdre Searles with Dr. Pearlean Brownie with Neurology. - If pt has >70% ICA stenosis, would benefit from Vascular surgery consult- which is not the case at present.  7) DVT Prophylaxis: Lovenox.   LOS: 3 days   Kori Goins 08/01/2011, 9:50 AM

## 2011-08-01 NOTE — Progress Notes (Signed)
3 Days Post-Op  Subjective: Having some cramping abdominal pain.  A little less bloated.  Passing some gas.  Objective: Vital signs in last 24 hours: Temp:  [97.6 F (36.4 C)-99.9 F (37.7 C)] 98.2 F (36.8 C) (05/25 0534) Pulse Rate:  [96-127] 114  (05/25 0534) Resp:  [16-22] 16  (05/25 0534) BP: (125-168)/(62-98) 137/74 mmHg (05/25 0534) SpO2:  [94 %-97 %] 94 % (05/25 0534) Last BM Date: 07/29/11  Intake/Output from previous day: 05/24 0701 - 05/25 0700 In: 1288 [I.V.:1288] Out: 4290 [Urine:1440; Emesis/NG output:2850] Intake/Output this shift:    PE: Abd-soft, distended, incisions c/d/i, hypoactive bowel sounds, bilious ng output  Lab Results:   Basename 08/01/11 0655 07/31/11 0657  WBC 13.9* 16.8*  HGB 14.7 15.1*  HCT 43.9 44.5  PLT 247 277   BMET  Basename 08/01/11 0655 07/31/11 0657  NA 138 138  K 4.6 4.0  CL 99 101  CO2 31 26  GLUCOSE 165* 240*  BUN 8 6  CREATININE 0.59 0.44*  CALCIUM 9.9 10.1   PT/INR  Basename 08/01/11 0655 07/31/11 0604  LABPROT 14.5 13.3  INR 1.11 0.99   Comprehensive Metabolic Panel:    Component Value Date/Time   NA 138 08/01/2011 0655   K 4.6 08/01/2011 0655   CL 99 08/01/2011 0655   CO2 31 08/01/2011 0655   BUN 8 08/01/2011 0655   CREATININE 0.59 08/01/2011 0655   CREATININE 0.75 05/08/2011 1547   GLUCOSE 165* 08/01/2011 0655   CALCIUM 9.9 08/01/2011 0655   AST 26 07/20/2011 1352   ALT 31 07/20/2011 1352   ALKPHOS 76 07/20/2011 1352   BILITOT 0.6 07/20/2011 1352   PROT 6.7 07/20/2011 1352   ALBUMIN 3.5 07/20/2011 1352     Studies/Results: Dg Chest Port 1 View  07/31/2011  *RADIOLOGY REPORT*  Clinical Data: Chest pain.  Hernia repair yesterday.  PORTABLE CHEST - 1 VIEW  Comparison: Chest x-ray 07/22/2011.  Findings: Lung volumes are low.  There are linear bibasilar opacities, most compatible with areas of postoperative subsegmental atelectasis.  Mild crowding of the vasculature is noted without frank pulmonary edema.  Heart  size is borderline enlarged, likely accentuated by low lung volumes. The patient is rotated to the right on today's exam, resulting in distortion of the mediastinal contours and reduced diagnostic sensitivity and specificity for mediastinal pathology.  Atherosclerotic calcifications within the arch of the aorta.  IMPRESSION: 1.  Low lung volumes with bibasilar postoperative atelectasis. 2.  Borderline cardiomegaly. 3.  Atherosclerosis.  Original Report Authenticated By: Florencia Reasons, M.D.   Dg Abd 2 Views  07/31/2011  *RADIOLOGY REPORT*  Clinical Data: Chest pain and abdominal distension.  Recent abdominal surgery.  PORTABLE CHEST - 1 VIEW  Comparison: No priors.  Findings: There is severe distension of the stomach with gas. Numerous dilated loops of gas-filled small bowel is seen throughout the entire abdomen measuring up to 4.7 cm in diameter.  However, there is gas and stool throughout the colon extending to the level of the distal sigmoid colon.  No rectal gas is clearly identified. No definite pneumoperitoneum is identified.  Surgical clips project over the right upper quadrant of the abdomen, consistent with the patient's history of prior cholecystectomy.  IMPRESSION:  1.  Overall, the bowel gas pattern as detailed above is most consistent with a postoperative ileus.  2.  No pneumoperitoneum at this time.  3.  Status post cholecystectomy.  Original Report Authenticated By: Florencia Reasons, M.D.    Anti-infectives:  Anti-infectives     Start     Dose/Rate Route Frequency Ordered Stop   07/29/11 1500   ceFAZolin (ANCEF) IVPB 2 g/50 mL premix        2 g 100 mL/hr over 30 Minutes Intravenous 3 times per day 07/29/11 1348 07/30/11 0536   07/29/11 1021   polymyxin B 500,000 Units, bacitracin 50,000 Units in sodium chloride irrigation 0.9 % 500 mL irrigation  Status:  Discontinued          As needed 07/29/11 1023 07/29/11 1136   07/29/11 0726   ceFAZolin (ANCEF) 2-3 GM-% IVPB SOLR      Comments: Lahoma Rocker: cabinet override         07/29/11 0726 07/29/11 1929   07/29/11 0723   ceFAZolin (ANCEF) IVPB 2 g/50 mL premix        2 g 100 mL/hr over 30 Minutes Intravenous 60 min pre-op 07/29/11 0723 07/29/11 0855          Assessment Principal Problem:  *Ventral hernia s/p laparoscopic lysis of adhesions and hernia repair with mesh 07/29/11 Active Problems:  Postop ileus-continues; bilious ng drainage  DIABETES MELLITUS, TYPE II-adequate control on SSI  Atrial fibrillation-Coumadin on hold    LOS: 3 days   Plan: Ambulate.  Wait for ileus to resolve.   Adolph Pollack 08/01/2011

## 2011-08-02 ENCOUNTER — Inpatient Hospital Stay (HOSPITAL_COMMUNITY): Payer: Medicare Other

## 2011-08-02 LAB — BASIC METABOLIC PANEL
BUN: 6 mg/dL (ref 6–23)
CO2: 31 mEq/L (ref 19–32)
Calcium: 9.6 mg/dL (ref 8.4–10.5)
GFR calc Af Amer: >90 mL/min (ref >90)
GFR calc non Af Amer: >90 mL/min (ref >90)
Glucose, Bld: 156 mg/dL — ABNORMAL HIGH (ref 70–99)
Sodium: 136 mEq/L (ref 135–145)

## 2011-08-02 LAB — CBC
HCT: 41.9 % (ref 36.0–46.0)
Hemoglobin: 13.9 g/dL (ref 12.0–15.0)
MCH: 31.5 pg (ref 26.0–34.0)
MCV: 95 fL (ref 78.0–100.0)
Platelets: 236 10*3/uL (ref 150–400)
RBC: 4.41 MIL/uL (ref 3.87–5.11)
RDW: 12.7 % (ref 11.5–15.5)

## 2011-08-02 LAB — GLUCOSE, CAPILLARY
Glucose-Capillary: 146 mg/dL — ABNORMAL HIGH (ref 70–99)
Glucose-Capillary: 149 mg/dL — ABNORMAL HIGH (ref 70–99)
Glucose-Capillary: 136 mg/dL — ABNORMAL HIGH (ref 70–99)
Glucose-Capillary: 131 mg/dL — ABNORMAL HIGH (ref 70–99)
Glucose-Capillary: 141 mg/dL — ABNORMAL HIGH (ref 70–99)

## 2011-08-02 LAB — PROTIME-INR: Prothrombin Time: 13.9 seconds (ref 11.6–15.2)

## 2011-08-02 MED ORDER — LIDOCAINE VISCOUS 2 % MT SOLN
20.0000 mL | OROMUCOSAL | Status: DC
  Administered 2011-08-03 – 2011-08-14 (×10): 20 mL via OROMUCOSAL
  Filled 2011-08-02 (×93): qty 20

## 2011-08-02 NOTE — Progress Notes (Signed)
Subjective: Pt feels the same. She is uncomfortable with the NG tube. Has constant throat irritation and pain with that.  Wants the NG tube out. Still has nausea, but no vomiting. Passes flatus, but no bowel movement yet.   Objective: Vital signs in last 24 hours: Filed Vitals:   08/01/11 0534 08/01/11 1400 08/01/11 2136 08/02/11 0634  BP: 137/74 130/65 111/57 134/75  Pulse: 114 99 105 97  Temp: 98.2 F (36.8 C) 97.9 F (36.6 C) 98 F (36.7 C) 98.1 F (36.7 C)  TempSrc: Oral  Oral   Resp: 16 18 18 18   Height:      Weight:      SpO2: 94% 97% 96% 97%   Weight change:   Intake/Output Summary (Last 24 hours) at 08/02/11 0802 Last data filed at 08/02/11 0333  Gross per 24 hour  Intake 1086.67 ml  Output   1200 ml  Net -113.33 ml   General: resting in bed comfortable HEENT: PERRL, EOMI, no scleral icterus. NG suction in place Cardiac: RRR, no rubs, murmurs or gallops Pulm: clear to auscultation bilaterally, moving normal volumes of air Abd: soft, mildly tender to palpation, mildly distended, hypoactive BS. Ext: warm and well perfused, no pedal edema Neuro: alert and oriented X3, cranial nerves II-XII grossly intact  Lab Results: Basic Metabolic Panel:  Lab 08/01/11 4098 07/31/11 0657  NA 138 138  K 4.6 4.0  CL 99 101  CO2 31 26  GLUCOSE 165* 240*  BUN 8 6  CREATININE 0.59 0.44*  CALCIUM 9.9 10.1  MG -- --  PHOS -- --   CBC:  Lab 08/02/11 0716 08/01/11 0655 07/31/11 0657  WBC 10.9* 13.9* --  NEUTROABS -- -- 13.2*  HGB 13.9 14.7 --  HCT 41.9 43.9 --  MCV 95.0 95.6 --  PLT 236 247 --   Cardiac Enzymes:  Lab 07/31/11 0657  CKTOTAL 84  CKMB 2.7  CKMBINDEX --  TROPONINI <0.30   CBG:  Lab 08/02/11 0733 08/02/11 0355 08/01/11 2332 08/01/11 1959 08/01/11 1605 08/01/11 1121  GLUCAP 136* 149* 146* 143* 143* 134*   Hemoglobin A1C:  Lab 07/29/11 1422  HGBA1C 6.3*   Thyroid Function Tests:  Lab 07/31/11 1016  TSH 0.756  T4TOTAL --  FREET4 --    T3FREE --  THYROIDAB --   Coagulation:  Lab 08/01/11 0655 07/31/11 0604 07/30/11 0710 07/29/11 0644  LABPROT 14.5 13.3 13.2 13.7  INR 1.11 0.99 0.98 1.03   Studies/Results: No results found. Medications: I have reviewed the patient's current medications. Scheduled Meds:    . amitriptyline  50 mg Oral QHS  . antiseptic oral rinse  15 mL Mouth Rinse q12n4p  . atenolol  25 mg Oral BID  . bisacodyl  10 mg Rectal Daily  . chlorhexidine  15 mL Mouth Rinse BID  . digoxin  0.125 mg Intravenous Daily  . diltiazem  180 mg Oral Daily  . enoxaparin (LOVENOX) injection  40 mg Subcutaneous Q24H  . FLUoxetine  20 mg Oral Daily  . gabapentin  100 mg Oral BID  . insulin aspart  0-15 Units Subcutaneous Q4H  . insulin glargine  10 Units Subcutaneous QHS  . ketorolac  1 drop Right Eye BID  . levothyroxine  75 mcg Oral QAC breakfast  . lisinopril  10 mg Oral Daily  . pantoprazole  40 mg Oral Q1200  . simvastatin  10 mg Oral q1800   Continuous Infusions:    . dextrose 5 % and 0.45 % NaCl  with KCl 20 mEq/L 100 mL/hr at 08/01/11 1632   PRN Meds:.albuterol, HYDROmorphone (DILAUDID) injection, lidocaine, magic mouthwash, ondansetron (ZOFRAN) IV, promethazine Assessment/Plan:  1) Post Op ileus- Ventral hernia s/p laparoscopic lysis of adhesions and hernia repair with mesh 07/29/11:  Slow clinical response. Still has nausea and NG suction- good output. Hasn't started clamping yet. - WBC trended down ( likely reactive) , no electrolyte abnormalities. - Continue NG suction and NPO except meds as per surgery. - Unclear if patient can be off today.  2) Afib: Pt mildly tachycardic this am. Although asymptomatic. - Coumadin on hold- until ileus resolves.  3) DM2: Well controlled. A1c- 6.3. - Continue Lantus at 10 units and SSI. CBG's mildly elevated, likely due to acute stress from surgery.  4) Hypothyroidism: TSH- 0.756. Normal. -Continue synthriod.  5) Hypertension: BP well controlled-  mildly elevated at times. - Continue Lisinopril and Atenolol.  6) Hx of TIA/CVA: Unclear when pt had it. She refuses any Hx though.  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 This was discussed by Dr. Dierdre Searles with Dr. Pearlean Brownie with Neurology. - If pt has >70% ICA stenosis, would benefit from Vascular surgery consult- which is not the case at present.  7) DVT Prophylaxis: Lovenox.   LOS: 4 days   , 08/02/2011, 8:02 AM

## 2011-08-02 NOTE — Progress Notes (Signed)
Internal Medicine Teaching Service Attending Note Date: 08/02/2011  Patient name: ANEEKA BOWDEN  Medical record number: [id number]  Date of birth: 06/19/1950    This patient has been seen and discussed with the house staff. Please see their note for complete details. I concur with their findings with the following additions/corrections:  Pt continues to have abd distension although decreased from yesterday. Lidocain helped a bit but still with discomfort from NG. No gas. ABD exam is soft, mild tenderness but no BS. Xray - improving gas pattern. CBG's cont to be in mid 100's with lantus.   Irvine Glorioso 08/02/2011, 2:28 PM

## 2011-08-02 NOTE — Progress Notes (Signed)
4 Days Post-Op  Subjective: Passing a little bit of gas.  Still feels bloated.   Objective: Vital signs in last 24 hours: Temp:  [97.9 F (36.6 C)-98.1 F (36.7 C)] 98.1 F (36.7 C) (05/26 0634) Pulse Rate:  [97-105] 97  (05/26 0634) Resp:  [18] 18  (05/26 0634) BP: (111-134)/(57-75) 134/75 mmHg (05/26 0634) SpO2:  [96 %-97 %] 97 % (05/26 0634) Last BM Date: 07/29/11  Intake/Output from previous day: 05/25 0701 - 05/26 0700 In: 1086.7 [I.V.:1086.7] Out: 1200 [Urine:1000; Emesis/NG output:200] Intake/Output this shift:    PE: Abd-soft, distended, incisions c/d/i, no bowel sounds heard, bilious ng output  Lab Results:   Basename 08/02/11 0716 08/01/11 0655  WBC 10.9* 13.9*  HGB 13.9 14.7  HCT 41.9 43.9  PLT 236 247   BMET  Basename 08/02/11 0716 08/01/11 0655  NA 136 138  K 4.5 4.6  CL 99 99  CO2 31 31  GLUCOSE 156* 165*  BUN 6 8  CREATININE 0.54 0.59  CALCIUM 9.6 9.9   PT/INR  Basename 08/02/11 0716 08/01/11 0655  LABPROT 13.9 14.5  INR 1.05 1.11   Comprehensive Metabolic Panel:    Component Value Date/Time   NA 136 08/02/2011 0716   K 4.5 08/02/2011 0716   CL 99 08/02/2011 0716   CO2 31 08/02/2011 0716   BUN 6 08/02/2011 0716   CREATININE 0.54 08/02/2011 0716   CREATININE 0.75 05/08/2011 1547   GLUCOSE 156* 08/02/2011 0716   CALCIUM 9.6 08/02/2011 0716   AST 26 07/20/2011 1352   ALT 31 07/20/2011 1352   ALKPHOS 76 07/20/2011 1352   BILITOT 0.6 07/20/2011 1352   PROT 6.7 07/20/2011 1352   ALBUMIN 3.5 07/20/2011 1352     Studies/Results: No results found.  Anti-infectives: Anti-infectives     Start     Dose/Rate Route Frequency Ordered Stop   07/29/11 1500   ceFAZolin (ANCEF) IVPB 2 g/50 mL premix        2 g 100 mL/hr over 30 Minutes Intravenous 3 times per day 07/29/11 1348 07/30/11 0536   07/29/11 1021   polymyxin B 500,000 Units, bacitracin 50,000 Units in sodium chloride irrigation 0.9 % 500 mL irrigation  Status:  Discontinued          As needed  07/29/11 1023 07/29/11 1136   07/29/11 0726   ceFAZolin (ANCEF) 2-3 GM-% IVPB SOLR     Comments: Lahoma Rocker: cabinet override         07/29/11 0726 07/29/11 1929   07/29/11 0723   ceFAZolin (ANCEF) IVPB 2 g/50 mL premix        2 g 100 mL/hr over 30 Minutes Intravenous 60 min pre-op 07/29/11 1610 07/29/11 0855          Assessment Principal Problem:  *Ventral hernia s/p laparoscopic lysis of adhesions and hernia repair with mesh 07/29/11 Active Problems:  Postop ileus-persistent  DIABETES MELLITUS, TYPE II-adequate control on SSI  Atrial fibrillation-Coumadin on hold    LOS: 4 days   Plan: Check x-rays.  Continue ngt.  Wait for ileus to resolve.   Adolph Pollack 08/02/2011

## 2011-08-03 LAB — CBC
HCT: 40.1 % (ref 36.0–46.0)
Hemoglobin: 13.5 g/dL (ref 12.0–15.0)
MCH: 31.8 pg (ref 26.0–34.0)
MCHC: 33.7 g/dL (ref 30.0–36.0)
MCV: 94.6 fL (ref 78.0–100.0)
RDW: 12.7 % (ref 11.5–15.5)

## 2011-08-03 LAB — PROTIME-INR: INR: 1.02 (ref 0.00–1.49)

## 2011-08-03 LAB — GLUCOSE, CAPILLARY
Glucose-Capillary: 123 mg/dL — ABNORMAL HIGH (ref 70–99)
Glucose-Capillary: 130 mg/dL — ABNORMAL HIGH (ref 70–99)
Glucose-Capillary: 135 mg/dL — ABNORMAL HIGH (ref 70–99)
Glucose-Capillary: 135 mg/dL — ABNORMAL HIGH (ref 70–99)
Glucose-Capillary: 137 mg/dL — ABNORMAL HIGH (ref 70–99)
Glucose-Capillary: 141 mg/dL — ABNORMAL HIGH (ref 70–99)
Glucose-Capillary: 142 mg/dL — ABNORMAL HIGH (ref 70–99)

## 2011-08-03 LAB — BASIC METABOLIC PANEL
BUN: 6 mg/dL (ref 6–23)
CO2: 28 mEq/L (ref 19–32)
Creatinine, Ser: 0.48 mg/dL — ABNORMAL LOW (ref 0.50–1.10)
GFR calc Af Amer: >90 mL/min (ref >90)
GFR calc non Af Amer: >90 mL/min (ref >90)
Glucose, Bld: 136 mg/dL — ABNORMAL HIGH (ref 70–99)
Potassium: 3.6 mEq/L (ref 3.5–5.1)
Sodium: 137 mEq/L (ref 135–145)

## 2011-08-03 MED ORDER — METOCLOPRAMIDE HCL 5 MG/ML IJ SOLN
10.0000 mg | Freq: Four times a day (QID) | INTRAMUSCULAR | Status: DC
  Administered 2011-08-03 – 2011-08-05 (×7): 10 mg via INTRAVENOUS
  Filled 2011-08-03 (×13): qty 2

## 2011-08-03 NOTE — Progress Notes (Addendum)
Subjective: Patient state that she feels better today.  Lidocaine partially relieved her throat irritation.  No nausea,  vomiting or abdominal pain.  Endorsed passing a lot of gas. Patient states that she is hungry and would like to know when her NG tube can be removed. No acute event overnight.  Objective: Vital signs in last 24 hours: Filed Vitals:   08/02/11 0634 08/02/11 1400 08/02/11 2127 08/03/11 0632  BP: 134/75 116/58 144/75 135/83  Pulse: 97 94 112 90  Temp: 98.1 F (36.7 C) 98.2 F (36.8 C) 98.5 F (36.9 C) 98.9 F (37.2 C)  TempSrc:   Oral   Resp: 18 18 18 18   Height:      Weight:      SpO2: 97% 96% 97% 98%   Weight change:   Intake/Output Summary (Last 24 hours) at 08/03/11 0849 Last data filed at 08/03/11 1610  Gross per 24 hour  Intake 3614.33 ml  Output   3650 ml  Net -35.67 ml   General: resting in bed comfortable  HEENT: PERRL, EOMI, no scleral icterus. NG suction in place  Cardiac: RRR, no rubs, murmurs or gallops  Pulm: clear to auscultation bilaterally, moving normal volumes of air  Abd: soft, mildly tender to palpation, mildly distended, hypoactive BS.  Ext: warm and well perfused, no pedal edema  Neuro: alert and oriented X3, cranial nerves II-XII grossly intact  Lab Results: Basic Metabolic Panel:  Lab 08/03/11 9604 08/02/11 0716   137 136  K 3.6 4.5  CL 101 99  CO2 28 31  GLUCOSE 136* 156*  BUN 6 6  CREATININE 0.48* 0.54  CALCIUM 9.5 9.6  MG -- --  PHOS -- --   CBC:  Lab 08/03/11 0555 08/02/11 0716 07/31/11 0657  WBC 9.9 10.9* --  NEUTROABS -- -- 13.2*  HGB 13.5 13.9 --  HCT 40.1 41.9 --  MCV 94.6 95.0 --  PLT 233 236 --   Cardiac Enzymes:  Lab 07/31/11 0657  CKTOTAL 84  CKMB 2.7  CKMBINDEX --  TROPONINI <0.30   CBG:  Lab 08/03/11 0758 08/03/11 0407 08/03/11 0015 08/02/11 2006 08/02/11 1548 08/02/11 1208  GLUCAP 130* 142* 123* 137* 141* 131*   Hemoglobin A1C:  Lab 07/29/11 1422  HGBA1C 6.3*    Thyroid Function  Tests:  Lab 07/31/11 1016  TSH 0.756  T4TOTAL --  FREET4 --  T3FREE --  THYROIDAB --   Coagulation:  Lab 08/03/11 0555 08/02/11 0716 08/01/11 0655 07/31/11 0604  LABPROT 13.6 13.9 14.5 13.3  INR 1.02 1.05 1.11 0.99   Micro Results: No results found for this or any previous visit (from the past 240 hour(s)). Studies/Results: Dg Abd Portable 2v  08/02/2011  *RADIOLOGY REPORT*  Clinical Data: Abdominal pain and distention.  Recent laparoscopic lysis of adhesions and hernia repair on 07/29/2011  PORTABLE ABDOMEN - 2 VIEW  Comparison: 07/31/2011  Findings: NG tube tip is in the distal stomach.  No visible free air on the left lateral decubitus view.  There is a single dilated loop of small bowel in the mid abdomen.  There is air in the nondistended colon.  Since the prior study the gaseous distention of the stomach has resolved and there is decreased air throughout the bowel since the prior study.  IMPRESSION: Overall improved appearance of the abdomen.  There is a single prominent dilated loop of small bowel in the midabdomen persisting.  Original Report Authenticated By: Gwynn Burly, M.D.   Medications: I have reviewed  the patient's current medications. Scheduled Meds:   . amitriptyline  50 mg Oral QHS  . antiseptic oral rinse  15 mL Mouth Rinse q12n4p  . atenolol  25 mg Oral BID  . bisacodyl  10 mg Rectal Daily  . chlorhexidine  15 mL Mouth Rinse BID  . digoxin  0.125 mg Intravenous Daily  . diltiazem  180 mg Oral Daily  . enoxaparin (LOVENOX) injection  40 mg Subcutaneous Q24H  . FLUoxetine  20 mg Oral Daily  . gabapentin  100 mg Oral BID  . insulin aspart  0-15 Units Subcutaneous Q4H  . insulin glargine  10 Units Subcutaneous QHS  . ketorolac  1 drop Right Eye BID  . levothyroxine  75 mcg Oral QAC breakfast  . lidocaine  20 mL Mouth/Throat Q4H  . lisinopril  10 mg Oral Daily  . pantoprazole  40 mg Oral Q1200  . simvastatin  10 mg Oral q1800   Continuous Infusions:    . dextrose 5 % and 0.45 % NaCl with KCl 20 mEq/L 100 mL/hr at 08/02/11 2140   PRN Meds:.albuterol, HYDROmorphone (DILAUDID) injection, magic mouthwash, ondansetron (ZOFRAN) IV, promethazine, DISCONTD: lidocaine Assessment/Plan:  Patient is stable, will sign off today after discussing with Dr. Rogelia Boga.  If needed clinically, may reconsult the teaching service team.  May resume her coumadin once she starts oral intake.  May continue current therapy for her DM and HTN, and resume Home regimen once discharge.   ) Post Op ileus- Ventral hernia s/p laparoscopic lysis of adhesions and hernia repair with mesh 07/29/11:  Slow clinical response.  WBC trended down ( likely reactive) , no electrolyte abnormalities.   - Continue NG suction and NPO except meds as per surgery.  - will defer the management to CCS team.   2) Afib: stable    - coumadin on hold for now>>>can be resumed once patient states oral intake.   3) DM2: Well controlled. A1c- 6.3.  - Continue Lantus at 10 units and SSI>>>CBG good control - can resume Home regimen once discharge.  4) Hypothyroidism: TSH- 0.756. Normal.  -Continue synthriod.   5) Hypertension: BP well controlled- mildly elevated at times.  - Continue Lisinopril and Atenolol.  -can resume home regimen once discharge  6) Hx of TIA/CVA: Unclear when pt had it. She refuses any Hx though.  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 This was discussed by Dr. Dierdre Searles with Dr. Pearlean Brownie with Neurology.  - If pt has >70% ICA stenosis, would benefit from Vascular surgery consult- which is not the case at present.   7) DVT Prophylaxis: Lovenox.      LOS: 5 days   ,  08/03/2011, 8:49 AM

## 2011-08-03 NOTE — Progress Notes (Signed)
5 Days Post-Op  Subjective: Passed a small amount of gas.  No BM.  Still feels bloated.  Objective: Vital signs in last 24 hours: Temp:  [97.9 F (36.6 C)-98.9 F (37.2 C)] 97.9 F (36.6 C) (05/27 1107) Pulse Rate:  [86-112] 86  (05/27 1107) Resp:  [17-18] 17  (05/27 1107) BP: (116-144)/(58-83) 120/83 mmHg (05/27 1107) SpO2:  [96 %-98 %] 97 % (05/27 1107) Last BM Date: 07/29/11  Intake/Output from previous day: 05/26 0701 - 05/27 0700 In: 3614.3 [I.V.:3613.3] Out: 3650 [Urine:3050; Emesis/NG output:600] Intake/Output this shift:    PE: Abd-soft, distended, incisions c/d/i, no bowel sounds heard, bilious ng output  Lab Results:   Basename 08/03/11 0555 08/02/11 0716  WBC 9.9 10.9*  HGB 13.5 13.9  HCT 40.1 41.9  PLT 233 236   BMET  Basename 08/03/11 0555 08/02/11 0716  NA 137 136  K 3.6 4.5  CL 101 99  CO2 28 31  GLUCOSE 136* 156*  BUN 6 6  CREATININE 0.48* 0.54  CALCIUM 9.5 9.6   PT/INR  Basename 08/03/11 0555 08/02/11 0716  LABPROT 13.6 13.9  INR 1.02 1.05   Comprehensive Metabolic Panel:    Component Value Date/Time   NA 137 08/03/2011 0555   K 3.6 08/03/2011 0555   CL 101 08/03/2011 0555   CO2 28 08/03/2011 0555   BUN 6 08/03/2011 0555   CREATININE 0.48* 08/03/2011 0555   CREATININE 0.75 05/08/2011 1547   GLUCOSE 136* 08/03/2011 0555   CALCIUM 9.5 08/03/2011 0555   AST 26 07/20/2011 1352   ALT 31 07/20/2011 1352   ALKPHOS 76 07/20/2011 1352   BILITOT 0.6 07/20/2011 1352   PROT 6.7 07/20/2011 1352   ALBUMIN 3.5 07/20/2011 1352     Studies/Results: Dg Abd Portable 2v  08/02/2011  *RADIOLOGY REPORT*  Clinical Data: Abdominal pain and distention.  Recent laparoscopic lysis of adhesions and hernia repair on 07/29/2011  PORTABLE ABDOMEN - 2 VIEW  Comparison: 07/31/2011  Findings: NG tube tip is in the distal stomach.  No visible free air on the left lateral decubitus view.  There is a single dilated loop of small bowel in the mid abdomen.  There is air in the  nondistended colon.  Since the prior study the gaseous distention of the stomach has resolved and there is decreased air throughout the bowel since the prior study.  IMPRESSION: Overall improved appearance of the abdomen.  There is a single prominent dilated loop of small bowel in the midabdomen persisting.  Original Report Authenticated By: Gwynn Burly, M.D.    Anti-infectives: Anti-infectives     Start     Dose/Rate Route Frequency Ordered Stop   07/29/11 1500   ceFAZolin (ANCEF) IVPB 2 g/50 mL premix        2 g 100 mL/hr over 30 Minutes Intravenous 3 times per day 07/29/11 1348 07/30/11 0536   07/29/11 1021   polymyxin B 500,000 Units, bacitracin 50,000 Units in sodium chloride irrigation 0.9 % 500 mL irrigation  Status:  Discontinued          As needed 07/29/11 1023 07/29/11 1136   07/29/11 0726   ceFAZolin (ANCEF) 2-3 GM-% IVPB SOLR     Comments: Lahoma Rocker: cabinet override         07/29/11 0726 07/29/11 1929   07/29/11 0723   ceFAZolin (ANCEF) IVPB 2 g/50 mL premix        2 g 100 mL/hr over 30 Minutes Intravenous 60 min pre-op 07/29/11 6578  07/29/11 0855          Assessment Principal Problem:  *Ventral hernia s/p laparoscopic lysis of adhesions and hernia repair with mesh 07/29/11 Active Problems:  Postop ileus-persistent  DIABETES MELLITUS, TYPE II-adequate control on SSI  Atrial fibrillation-Coumadin on hold    LOS: 5 days   Plan: Try Reglan.  Continue ngt.  Wait for ileus to resolve.   Adolph Pollack 08/03/2011

## 2011-08-03 NOTE — Progress Notes (Signed)
Physical Therapy Treatment Patient Details Name: TINY CHAUDHARY MRN: [id number] DOB: [birth year] Today's Date: 08/03/2011 Time: 0802-0829 PT Time Calculation (min): 27 min  PT Assessment / Plan / Recommendation Comments on Treatment Session  [name] is still very limited by her pain and medical complications, has not eaten since the 5/21. Reports she will stay with her son for a while until she is independent. OT consult requested. Pt reports she has been ambulating with staff everyday, I observed her ambulating yesterday.     Follow Up Recommendations  Home health PT;Supervision for mobility/OOB    Barriers to Discharge        Equipment Recommendations  Rolling walker with 5" wheels;3 in 1 bedside comode    Recommendations for Other Services    Frequency Min 3X/week   Plan Discharge plan remains appropriate;Frequency remains appropriate    Precautions / Restrictions Precautions Precautions: Fall Precaution Comments: ng tube       Mobility  Bed Mobility Rolling Left: 5: Supervision;With rail Left Sidelying to Sit: 4: Min assist;HOB flat Details for Bed Mobility Assistance: cues for sequencing and efficiency, slow to bring trunk up from bed needing min facilitation for follow through and repositioning EOB with pad Transfers Sit to Stand: 4: Min guard;With upper extremity assist;From bed;From chair/3-in-1 (potty chair over 3in1) Stand to Sit: 4: Min guard;With upper extremity assist;To chair/3-in-1 (potty chair over 3in1) Details for Transfer Assistance: cues for hand placement (pt tends to pull on RW) Ambulation/Gait Ambulation Distance (Feet): 20 Feet Assistive device: Rolling walker Ambulation/Gait Assistance Details: pt ambulated to the bathroom and back but too fatigued and painful today for further ambulation; flexed gait, min tactile assist for maneuvering RW and upright posture as well as stability Gait Pattern: Trunk flexed;Shuffle    Exercises      PT  Goals Acute Rehab PT Goals PT Goal: Rolling Supine to Left Side - Progress: Progressing toward goal PT Goal: Supine/Side to Sit - Progress: Progressing toward goal PT Goal: Sit to Stand - Progress: Progressing toward goal PT Goal: Stand to Sit - Progress: Progressing toward goal PT Transfer Goal: Bed to Chair/Chair to Bed - Progress: Progressing toward goal PT Goal: Ambulate - Progress: Progressing toward goal  Visit Information  Last PT Received On: 08/03/11 Assistance Needed: +1    Subjective Data  Subjective: Im still feeling rough.    Cognition  Overall Cognitive Status: Appears within functional limits for tasks assessed/performed Arousal/Alertness: Awake/alert Orientation Level: Appears intact for tasks assessed Behavior During Session: [facility] for tasks performed    Balance     End of Session PT - End of Session Equipment Utilized During Treatment: Gait belt Activity Tolerance: Patient limited by pain Patient left: in chair;with call bell/phone within reach Nurse Communication: Mobility status;Patient requests pain meds    WHITLOW,Jakeem Grape [name] 08/03/2011, 11:39 AM

## 2011-08-03 NOTE — Progress Notes (Signed)
Internal Medicine Teaching Service Attending Note Date: 08/03/2011  Patient name: [name]  Medical record number: [id number]  Date of birth: 10-Dec-1950    This patient has been seen and discussed with the house staff. Please see their note for complete details. I concur with their findings with the following additions/corrections:  Ms [name] is sitting in chair and looks much better today and is more interactive and less focused on her current state. Doesn't complain as much of throat irritation today. Able to take ice chips. Her BO is moderately controlled - less than 144/75. CBG's are OK on lantus.   Agree with Dr Allena Katz to sign off but will be happy to resume care if needed until D/C.  1. Resume warfarin once able to take PO.  2. Resume Glucophage and stop lantus once pt is able to take Po and acute GI issues resolved.  3. Resume home dose of Dig 0.25 PO when able to take PO. Her IV dose was decreased as it is not a 1:1 conversion PO to IV.  4. Outpt Dig level as her last Dig level was 2009 and too high (1.3). Would not check now as on reduced IV dose and we need to know what she is at on her regular dose.  [place] Morren 08/03/2011, 10:38 AM

## 2011-08-04 ENCOUNTER — Inpatient Hospital Stay (HOSPITAL_COMMUNITY): Payer: Medicare Other

## 2011-08-04 LAB — CBC
HCT: 38.2 % (ref 36.0–46.0)
Hemoglobin: 13.4 g/dL (ref 12.0–15.0)
MCH: 32.8 pg (ref 26.0–34.0)
MCHC: 35.1 g/dL (ref 30.0–36.0)
MCV: 93.6 fL (ref 78.0–100.0)
RBC: 4.08 MIL/uL (ref 3.87–5.11)

## 2011-08-04 LAB — GLUCOSE, CAPILLARY
Glucose-Capillary: 130 mg/dL — ABNORMAL HIGH (ref 70–99)
Glucose-Capillary: 133 mg/dL — ABNORMAL HIGH (ref 70–99)
Glucose-Capillary: 158 mg/dL — ABNORMAL HIGH (ref 70–99)
Glucose-Capillary: 150 mg/dL — ABNORMAL HIGH (ref 70–99)

## 2011-08-04 LAB — BASIC METABOLIC PANEL WITH GFR
BUN: 6 mg/dL (ref 6–23)
CO2: 28 meq/L (ref 19–32)
Calcium: 9.5 mg/dL (ref 8.4–10.5)
Chloride: 98 meq/L (ref 96–112)
Creatinine, Ser: 0.52 mg/dL (ref 0.50–1.10)
GFR calc Af Amer: >90 mL/min
GFR calc non Af Amer: >90 mL/min
Glucose, Bld: 149 mg/dL — ABNORMAL HIGH (ref 70–99)
Potassium: 3.9 meq/L (ref 3.5–5.1)
Sodium: 135 meq/L (ref 135–145)

## 2011-08-04 LAB — PROTIME-INR
INR: 1.02 (ref 0.00–1.49)
Prothrombin Time: 13.6 seconds (ref 11.6–15.2)

## 2011-08-04 MED ORDER — ENOXAPARIN SODIUM 120 MG/0.8ML ~~LOC~~ SOLN
120.0000 mg | SUBCUTANEOUS | Status: DC
  Administered 2011-08-04 – 2011-08-06 (×3): 120 mg via SUBCUTANEOUS
  Filled 2011-08-04 (×4): qty 0.8

## 2011-08-04 NOTE — Progress Notes (Signed)
Occupational Therapy Evaluation Patient Details Name: [name] MRN: [id number] DOB: [birth year] Today's Date: 08/04/2011 Time: 7829-5621 OT Time Calculation (min): 24 min  OT Assessment / Plan / Recommendation Clinical Impression  61 yo s/p ventral hernia repair. Pt will benefit from skilled OTservices secondary to deficits listed below to max independence with ADL and functional mobility for ADL to D/C home with intermittent supervision and HH services. Pt states that she plans to go home with her son for a few days before returning to her own home.     OT Assessment  Patient needs continued OT Services    Follow Up Recommendations  Home health OT    Barriers to Discharge None    Equipment Recommendations  Rolling walker with 5" wheels;3 in 1 bedside comode    Recommendations for Other Services    Frequency  Min 2X/week    Precautions / Restrictions Precautions Precautions: Fall Precaution Comments: NGT   Pertinent Vitals/Pain C/o throat pain.    ADL  Eating/Feeding: Other (comment) (liquids only) Grooming: Simulated;Set up Where Assessed - Grooming: Supported sitting Upper Body Bathing: Simulated;Set up Where Assessed - Upper Body Bathing: Supported sitting Lower Body Bathing: Moderate assistance Where Assessed - Lower Body Bathing: Supported sit to stand Upper Body Dressing: Simulated;Set up Where Assessed - Upper Body Dressing: Supported sitting Lower Body Dressing: Simulated;Moderate assistance Where Assessed - Lower Body Dressing: Sopported sit to stand Toilet Transfer: Simulated;Min guard Toilet Transfer Method: Sit to Barista: Bedside commode;Other (comment) (over toilet) Toileting - Clothing Manipulation and Hygiene: Performed;Supervision/safety Where Assessed - Toileting Clothing Manipulation and Hygiene: Standing    OT Diagnosis: Generalized weakness;Acute pain  OT Problem List: Decreased strength;Decreased activity  tolerance;Decreased knowledge of use of DME or AE;Obesity;Pain OT Treatment Interventions: Self-care/ADL training;Therapeutic exercise;DME and/or AE instruction;Energy conservation;Therapeutic activities;Patient/family education   OT Goals Acute Rehab OT Goals OT Goal Formulation: With patient Time For Goal Achievement: 08/18/11 Potential to Achieve Goals: Good ADL Goals Pt Will Perform Lower Body Bathing: with set-up;with caregiver independent in assisting;Sit to stand from chair;with adaptive equipment;with cueing (comment type and amount) ADL Goal: Lower Body Bathing - Progress: Goal set today Pt Will Perform Lower Body Dressing: with set-up;with caregiver independent in assisting;Sit to stand from chair;with adaptive equipment ADL Goal: Lower Body Dressing - Progress: Goal set today Pt Will Transfer to Toilet: with modified independence;with DME;3-in-1;Ambulation ADL Goal: Toilet Transfer - Progress: Goal set today Pt Will Perform Tub/Shower Transfer: Shower transfer;with supervision;with DME;Other (comment) (using 3 in 1) ADL Goal: Tub/Shower Transfer - Progress: Goal set today Arm Goals Pt Will Complete Theraband Exer: Independently;to increase strength;Bilateral upper extremities;2 sets;[name] 3 Theraband Arm Goal: Theraband Exercises - Progress: Goal set today  Visit Information  Last OT Received On: 08/04/11 Assistance Needed: +1    Subjective Data  Subjective: I can work out someone to stay with me.   Prior Functioning  Home Living Lives With: Alone Available Help at Discharge: Family;Available PRN/intermittently Type of Home: House Home Access: Stairs to enter [employer] of Steps: 3 Entrance Stairs-Rails: None Home Layout: Laundry or work area in basement;Able to live on main [name] with bedroom/bathroom Bathroom Shower/Tub: Health visitor: Pharmacist, community: Yes How Accessible: Accessible via walker Home Adaptive Equipment:  Shower chair with back Prior Function [name] of Independence: Independent Able to Take Stairs?: No Driving: Yes Vocation: On disability Communication Communication: No difficulties Dominant Hand: Left    Cognition  Overall Cognitive Status: Appears within functional limits for tasks  assessed/performed Arousal/Alertness: Awake/alert Orientation [name]: Appears intact for tasks assessed Behavior During Session: [facility] for tasks performed    Extremity/Trunk Assessment Right Upper Extremity Assessment RUE ROM/Strength/Tone: Within functional levels RUE Sensation: WFL - Light Touch;WFL - Proprioception RUE Coordination: WFL - gross/fine motor Left Upper Extremity Assessment LUE ROM/Strength/Tone: Within functional levels LUE Sensation: WFL - Light Touch;WFL - Proprioception LUE Coordination: WFL - gross/fine motor Trunk Assessment Trunk Assessment: Normal   Mobility Bed Mobility Rolling Left: 5: Supervision;With rail Left Sidelying to Sit: 4: Min assist;HOB elevated;With rails (30 degrees) Details for Bed Mobility Assistance: pulling on therapist to bring trunk upright Transfers Transfers: Sit to Stand;Stand to Sit Sit to Stand: 4: Min guard Stand to Sit: 4: Min guard Details for Transfer Assistance: cues for safe hand placement and follow through to stand with use of RW as well as min facilitation to control descent to recliner   Exercise    Balance  supervision  End of Session OT - End of Session Equipment Utilized During Treatment: Gait belt Activity Tolerance: Patient limited by fatigue Patient left: in chair;with call bell/phone within reach;with family/visitor present   [name],[name] 08/04/2011, 4:06 PM [facility], OTR/L  [phone] 08/04/2011

## 2011-08-04 NOTE — Progress Notes (Signed)
6 Days Post-Op  Subjective: She is feeling better. Has had 4 bowel movements. States she is hungry.  She feels that abdominal distention has resolved. Minimal abdominal pain. NG Drainage still bilious but total output 650 cc per 24 hours.  Started on Reglan IV yesterday.  Low-grade temp 100.3 noted. Otherwise vitals are stable. Lab yesterday showed WBC down to 9900. X-rays of her weekend showed improved ileus pattern and some stool appeared to be evacuated from colon.  Note that internal medicine teaching service has signed off from consultative care.  Objective: Vital signs in last 24 hours: Temp:  [97.9 F (36.6 C)-100.3 F (37.9 C)] 99.5 F (37.5 C) (05/28 0629) Pulse Rate:  [86-95] 94  (05/28 0629) Resp:  [17-18] 17  (05/28 0629) BP: (120-140)/(69-83) 140/69 mmHg (05/28 0629) SpO2:  [91 %-98 %] 91 % (05/28 0629) Last BM Date: 08/03/11  Intake/Output from previous day: 05/27 0701 - 05/28 0700 In: 2431 [I.V.:2431] Out: 1950 [Urine:1150; Emesis/NG output:800] Intake/Output this shift: Total I/O In: 1300 [I.V.:1300] Out: 1125 [Urine:650; Emesis/NG output:475]  General appearance: alert. In no distress. Friendly  Spirits good. NG in place. GI: obese and protuberant, but not obviously distended and not tympanitic. Hypoactive bowel sounds present. Wounds looked fine.  Lab Results:  Results for orders placed during the hospital encounter of 07/29/11 (from the past 24 hour(s))  GLUCOSE, CAPILLARY     Status: Abnormal   Collection Time   08/03/11  7:58 AM      Component Value Range   Glucose-Capillary 130 (*) 70 - 99 (mg/dL)   Comment 1 Notify RN    GLUCOSE, CAPILLARY     Status: Abnormal   Collection Time   08/03/11 12:00 PM      Component Value Range   Glucose-Capillary 141 (*) 70 - 99 (mg/dL)   Comment 1 Notify RN    GLUCOSE, CAPILLARY     Status: Abnormal   Collection Time   08/03/11  4:30 PM      Component Value Range   Glucose-Capillary 141 (*) 70 - 99 (mg/dL)   Comment 1 Notify RN    GLUCOSE, CAPILLARY     Status: Abnormal   Collection Time   08/03/11  7:47 PM      Component Value Range   Glucose-Capillary 135 (*) 70 - 99 (mg/dL)  GLUCOSE, CAPILLARY     Status: Abnormal   Collection Time   08/03/11 11:40 PM      Component Value Range   Glucose-Capillary 135 (*) 70 - 99 (mg/dL)  GLUCOSE, CAPILLARY     Status: Abnormal   Collection Time   08/04/11  3:37 AM      Component Value Range   Glucose-Capillary 130 (*) 70 - 99 (mg/dL)  PROTIME-INR     Status: Normal   Collection Time   08/04/11  5:20 AM      Component Value Range   Prothrombin Time 13.6  11.6 - 15.2 (seconds)   INR 1.02  0.00 - 1.49      Studies/Results: @RISRSLT24 @     . amitriptyline  50 mg Oral QHS  . antiseptic oral rinse  15 mL Mouth Rinse q12n4p  . atenolol  25 mg Oral BID  . bisacodyl  10 mg Rectal Daily  . chlorhexidine  15 mL Mouth Rinse BID  . digoxin  0.125 mg Intravenous Daily  . diltiazem  180 mg Oral Daily  . enoxaparin (LOVENOX) injection  120 mg Subcutaneous Q24H  . FLUoxetine  20 mg  Oral Daily  . gabapentin  100 mg Oral BID  . insulin aspart  0-15 Units Subcutaneous Q4H  . insulin glargine  10 Units Subcutaneous QHS  . ketorolac  1 drop Right Eye BID  . levothyroxine  75 mcg Oral QAC breakfast  . lidocaine  20 mL Mouth/Throat Q4H  . lisinopril  10 mg Oral Daily  . metoCLOPramide (REGLAN) injection  10 mg Intravenous Q6H  . pantoprazole  40 mg Oral Q1200  . simvastatin  10 mg Oral q1800  . DISCONTD: enoxaparin (LOVENOX) injection  40 mg Subcutaneous Q24H     Assessment/Plan: s/p Procedure(s): LAPAROSCOPIC VENTRAL HERNIA INSERTION OF MESH  POD #5. Ileus seems to be resolving.  On reglan. We will clamp NG tube, allow the liquids, and check abdominal x-rays today. Lab work tomorrow Will hold off on nutritional support unless ileus persists. Hopefully we can get NG tube out within the next 24 hours.  Atrial fibrillation. Stable. Coumadin on hold  for now. Will resume once patient resumed oral intake. In the meantime, I will put her back on Lovenox 120 mg per day for full anticoagulation.  Type 2 diabetes. Well controlled according to medicine service. On Lantus and sliding scale insulin. Will resume home regimen with upon resumption of diet and discharge.  Hypothyroidism. TSH normal. Continue Synthroid  Hypertension. BP well controlled. Continue lisinopril and atenolol. Resume home regimen was discharged.  Chronic anxiety and depression. Currently not a problem.      LOS: 6 days     M. Derrell Lolling, M.D., [facility] General and Minimally invasive Surgery Breast and Colorectal Surgery Office:   [phone] Pager:   [phone]  08/04/2011  . .prob

## 2011-08-04 NOTE — Progress Notes (Signed)
Physical Therapy Treatment Patient Details Name: [name] MRN: [id number] DOB: March 08, 1951 Today's Date: 08/04/2011 Time: 4098-1191 PT Time Calculation (min): 25 min  PT Assessment / Plan / Recommendation Comments on Treatment Session  [name] did better today but obviously still painful and easily fatigued. Continues to progress with therapies.     Follow Up Recommendations  Home health PT;Supervision for mobility/OOB    Barriers to Discharge        Equipment Recommendations  Rolling walker with 5" wheels;3 in 1 bedside comode    Recommendations for Other Services    Frequency Min 3X/week   Plan Discharge plan remains appropriate;Frequency remains appropriate    Precautions / Restrictions Precautions Precautions: Fall Precaution Comments: NGT       Mobility  Bed Mobility Rolling Left: 5: Supervision;With rail Left Sidelying to Sit: 4: Min assist;HOB elevated;With rails (30 degrees) Details for Bed Mobility Assistance: pulling on therapist to bring trunk upright Transfers Transfers: Sit to Stand;Stand to Sit Sit to Stand: 4: Min assist;With upper extremity assist;From bed;From chair/3-in-1 Stand to Sit: 4: Min assist;With upper extremity assist;To chair/3-in-1 Details for Transfer Assistance: cues for safe hand placement and follow through to stand with use of RW as well as min facilitation to control descent to recliner Ambulation/Gait Ambulation/Gait Assistance: 4: Min guard Ambulation Distance (Feet): 110 Feet Assistive device: Rolling walker Ambulation/Gait Assistance Details: slow flexed gait, cues to step into RW and for improved upright posture Gait Pattern: Wide base of support;Shuffle;Trunk flexed    Exercises      PT Goals Acute Rehab PT Goals PT Goal: Rolling Supine to Left Side - Progress: Progressing toward goal PT Goal: Supine/Side to Sit - Progress: Progressing toward goal PT Goal: Sit to Stand - Progress: Progressing toward goal PT Goal:  Stand to Sit - Progress: Progressing toward goal PT Transfer Goal: Bed to Chair/Chair to Bed - Progress: Progressing toward goal PT Goal: Ambulate - Progress: Progressing toward goal  Visit Information  Last PT Received On: 08/04/11 Assistance Needed: +1    Subjective Data      Cognition  Overall Cognitive Status: Appears within functional limits for tasks assessed/performed Arousal/Alertness: Awake/alert Orientation Level: Appears intact for tasks assessed Behavior During Session: [facility] for tasks performed    Balance     End of Session PT - End of Session Equipment Utilized During Treatment: Gait belt Activity Tolerance: Patient tolerated treatment well Patient left: in chair;with call bell/phone within reach Nurse Communication: Mobility status    [name] 08/04/2011, 12:37 PM

## 2011-08-05 LAB — COMPREHENSIVE METABOLIC PANEL
ALT: 15 U/L (ref 0–35)
AST: 15 U/L (ref 0–37)
Albumin: 3 g/dL — ABNORMAL LOW (ref 3.5–5.2)
Alkaline Phosphatase: 90 U/L (ref 39–117)
BUN: 7 mg/dL (ref 6–23)
CO2: 21 mEq/L (ref 19–32)
Calcium: 10.1 mg/dL (ref 8.4–10.5)
Creatinine, Ser: 0.5 mg/dL (ref 0.50–1.10)
GFR calc Af Amer: >90 mL/min (ref >90)
Sodium: 136 mEq/L (ref 135–145)
Total Bilirubin: 1 mg/dL (ref 0.3–1.2)
Total Protein: 7.1 g/dL (ref 6.0–8.3)

## 2011-08-05 LAB — DIFFERENTIAL
Basophils Absolute: 0 10*3/uL (ref 0.0–0.1)
Basophils Relative: 0 % (ref 0–1)
Eosinophils Absolute: 0.2 10*3/uL (ref 0.0–0.7)
Eosinophils Relative: 1 % (ref 0–5)
Lymphocytes Relative: 11 % — ABNORMAL LOW (ref 12–46)
Monocytes Absolute: 1.5 10*3/uL — ABNORMAL HIGH (ref 0.1–1.0)
Neutro Abs: 14.1 10*3/uL — ABNORMAL HIGH (ref 1.7–7.7)

## 2011-08-05 LAB — GLUCOSE, CAPILLARY
Glucose-Capillary: 151 mg/dL — ABNORMAL HIGH (ref 70–99)
Glucose-Capillary: 153 mg/dL — ABNORMAL HIGH (ref 70–99)
Glucose-Capillary: 170 mg/dL — ABNORMAL HIGH (ref 70–99)
Glucose-Capillary: 176 mg/dL — ABNORMAL HIGH (ref 70–99)
Glucose-Capillary: 162 mg/dL — ABNORMAL HIGH (ref 70–99)

## 2011-08-05 LAB — CBC
HCT: 40.2 % (ref 36.0–46.0)
Hemoglobin: 14.1 g/dL (ref 12.0–15.0)
MCH: 32.4 pg (ref 26.0–34.0)
MCHC: 35.1 g/dL (ref 30.0–36.0)
MCV: 92.4 fL (ref 78.0–100.0)
RBC: 4.35 MIL/uL (ref 3.87–5.11)
RDW: 12.5 % (ref 11.5–15.5)

## 2011-08-05 LAB — PROTIME-INR: Prothrombin Time: 14.7 seconds (ref 11.6–15.2)

## 2011-08-05 MED ORDER — WARFARIN SODIUM 7.5 MG PO TABS
7.5000 mg | ORAL_TABLET | Freq: Once | ORAL | Status: AC
  Administered 2011-08-05: 7.5 mg via ORAL
  Filled 2011-08-05: qty 1

## 2011-08-05 MED ORDER — INSULIN ASPART 100 UNIT/ML ~~LOC~~ SOLN
0.0000 [IU] | Freq: Three times a day (TID) | SUBCUTANEOUS | Status: DC
  Administered 2011-08-05 (×2): 3 [IU] via SUBCUTANEOUS
  Administered 2011-08-06: 2 [IU] via SUBCUTANEOUS
  Administered 2011-08-06: 3 [IU] via SUBCUTANEOUS

## 2011-08-05 MED ORDER — GLIPIZIDE ER 10 MG PO TB24
10.0000 mg | ORAL_TABLET | Freq: Two times a day (BID) | ORAL | Status: DC
  Administered 2011-08-05 – 2011-08-06 (×3): 10 mg via ORAL
  Filled 2011-08-05 (×5): qty 1

## 2011-08-05 MED ORDER — GUAIFENESIN 100 MG/5ML PO SOLN
5.0000 mL | ORAL | Status: DC | PRN
  Filled 2011-08-05: qty 5

## 2011-08-05 MED ORDER — WARFARIN - PHARMACIST DOSING INPATIENT: Freq: Every day | Status: DC

## 2011-08-05 MED ORDER — HYDROCODONE-ACETAMINOPHEN 10-325 MG PO TABS: 1.0000 | ORAL_TABLET | ORAL | Status: DC | PRN

## 2011-08-05 MED ORDER — DIGOXIN 250 MCG PO TABS
0.2500 mg | ORAL_TABLET | Freq: Every day | ORAL | Status: DC
  Administered 2011-08-05 – 2011-08-06 (×2): 0.25 mg via ORAL
  Filled 2011-08-05 (×2): qty 1

## 2011-08-05 MED ORDER — METOCLOPRAMIDE HCL 10 MG PO TABS
10.0000 mg | ORAL_TABLET | Freq: Three times a day (TID) | ORAL | Status: DC
  Administered 2011-08-05 (×4): 10 mg via ORAL
  Filled 2011-08-05 (×7): qty 1

## 2011-08-05 MED ORDER — METFORMIN HCL 500 MG PO TABS
1000.0000 mg | ORAL_TABLET | Freq: Two times a day (BID) | ORAL | Status: DC
  Administered 2011-08-05 – 2011-08-06 (×3): 1000 mg via ORAL
  Filled 2011-08-05 (×6): qty 2

## 2011-08-05 NOTE — Progress Notes (Addendum)
7 Days Post-Op  Subjective: Stable and alert. Ambulating. Takes the NG tube. Coughing more. Tolerating clear liquids. Denies nausea. Abdominal pain improving. Having bowel movements. Is hungry.  Laboratory today showed WBC 12,600, potassium 3.9, BUN 6, glucose 149.  Abdominal x-ray showed  gastric distention has resolved and small bowel distention has resolved. Some atelectasis noted.  Objective: Vital signs in last 24 hours: Temp:  [99.5 F (37.5 C)-99.8 F (37.7 C)] 99.5 F (37.5 C) (05/28 2211) Pulse Rate:  [94-115] 115  (05/28 2211) Resp:  [17-20] 18  (05/28 2211) BP: (140-146)/(58-69) 140/58 mmHg (05/28 2211) SpO2:  [91 %-94 %] 92 % (05/28 2211) Last BM Date: 08/03/11  Intake/Output from previous day: 05/28 0701 - 05/29 0700 In: 2278.3 [I.V.:2278.3] Out: 650 [Urine:650] Intake/Output this shift: Total I/O In: 1464 [I.V.:1464] Out: -   General appearance: alert. Mental status normal. No obvious distress other than nasal discomfort from NG tube. Resp: intermittent rhonchi and wheeze. Moving air well. GI: abdomen obese, soft.  Minimally tender. Wounds looked fine. Hypoactive bowel sounds.  Not tympanitic.  Lab Results:  Results for orders placed during the hospital encounter of 07/29/11 (from the past 24 hour(s))  GLUCOSE, CAPILLARY     Status: Abnormal   Collection Time   08/04/11  7:47 AM      Component Value Range   Glucose-Capillary 133 (*) 70 - 99 (mg/dL)   Comment 1 Notify RN    GLUCOSE, CAPILLARY     Status: Abnormal   Collection Time   08/04/11 12:01 PM      Component Value Range   Glucose-Capillary 158 (*) 70 - 99 (mg/dL)   Comment 1 Notify RN    GLUCOSE, CAPILLARY     Status: Abnormal   Collection Time   08/04/11  4:37 PM      Component Value Range   Glucose-Capillary 150 (*) 70 - 99 (mg/dL)   Comment 1 Notify RN    GLUCOSE, CAPILLARY     Status: Abnormal   Collection Time   08/04/11  8:01 PM      Component Value Range   Glucose-Capillary 136 (*) 70 -  99 (mg/dL)  GLUCOSE, CAPILLARY     Status: Abnormal   Collection Time   08/04/11 11:46 PM      Component Value Range   Glucose-Capillary 151 (*) 70 - 99 (mg/dL)  GLUCOSE, CAPILLARY     Status: Abnormal   Collection Time   08/05/11  3:45 AM      Component Value Range   Glucose-Capillary 153 (*) 70 - 99 (mg/dL)     Studies/Results: @RISRSLT24 @     . amitriptyline  50 mg Oral QHS  . antiseptic oral rinse  15 mL Mouth Rinse q12n4p  . atenolol  25 mg Oral BID  . bisacodyl  10 mg Rectal Daily  . chlorhexidine  15 mL Mouth Rinse BID  . digoxin  0.125 mg Intravenous Daily  . diltiazem  180 mg Oral Daily  . enoxaparin (LOVENOX) injection  120 mg Subcutaneous Q24H  . FLUoxetine  20 mg Oral Daily  . gabapentin  100 mg Oral BID  . insulin aspart  0-15 Units Subcutaneous Q4H  . insulin glargine  10 Units Subcutaneous QHS  . ketorolac  1 drop Right Eye BID  . levothyroxine  75 mcg Oral QAC breakfast  . lidocaine  20 mL Mouth/Throat Q4H  . lisinopril  10 mg Oral Daily  . metoCLOPramide (REGLAN) injection  10 mg Intravenous Q6H  .  pantoprazole  40 mg Oral Q1200  . simvastatin  10 mg Oral q1800  . DISCONTD: enoxaparin (LOVENOX) injection  40 mg Subcutaneous Q24H     Assessment/Plan: s/p Procedure(s): LAPAROSCOPIC VENTRAL HERNIA INSERTION OF MESH  POD #6 . Ileus seems to be resolving Discontinue NG tube. Advanced to full liquids. Change to by mouth Reglan.  Atrial fibrillation. Stable. On full dose Lovenox. Will ask pharmacy to restart Coumadin and monitor.  Type 2 diabetes. Reasonably well-controlled. Will discontinue Lantus, change sliding scale to AC and HS. Will resume Glucotrol and metformin.  Hypothyroidism. TSH normal. Continue Synthroid.  Hypertension. reasonably well-controlled on current regimen.  Atelectasis. We'll encourage incentive spirometry and add guaifenesin.  Chronic anxiety and depression. Currently not much of a problem.  Somewhat deconditioned. We'll  arrange for home health PT and home health OT, as recommended by those Departments.    LOS: 7 days    Cookie Pore M. Derrell Lolling, M.D., [facility] General and Minimally invasive Surgery Breast and Colorectal Surgery Office:   [phone] Pager:   [phone]  08/05/2011  . .prob

## 2011-08-05 NOTE — Progress Notes (Signed)
Occupational Therapy Treatment Patient Details Name: [name] MRN: [id number] DOB: 1951/01/03 Today's Date: 08/05/2011 Time: 4098-1191 OT Time Calculation (min): 15 min  OT Assessment / Plan / Recommendation Comments on Treatment Session Focus of session on education regarding theraband. Son/pt demonstrated exercises with min cues. Discussed D/C plans with son, who is able to provide S after D/C. Pt ambulated in hall with son assisting as needed at [facility] level. Good progress. Son demonstrating independence with ambulating with his mother.    Follow Up Recommendations  Home health OT    Barriers to Discharge       Equipment Recommendations  Rolling walker with 5" wheels;3 in 1 bedside comode    Recommendations for Other Services    Frequency Min 2X/week   Plan Discharge plan remains appropriate    Precautions / Restrictions Precautions Precautions: Fall Restrictions Weight Bearing Restrictions: No   Pertinent Vitals/Pain No complaints    ADL  Lower Body Dressing: Performed;Minimal assistance;Other (comment) (with reacher and sock aid) Where Assessed - Lower Body Dressing: Unsupported sitting Toilet Transfer: Performed;Supervision/safety Acupuncturist: Comfort height toilet Toileting - Clothing Manipulation and Hygiene: Performed;Modified independent Where Assessed - Toileting Clothing Manipulation and Hygiene: Standing Equipment Used: Rolling walker;Sock aid;Long-handled sponge;Reacher;Gait belt Transfers/Ambulation Related to ADLs: superivison @ RW level ADL Comments: limited due to pain/discomfort. Increased independence with AE    OT Diagnosis:    OT Problem List:   OT Treatment Interventions:     OT Goals Acute Rehab OT Goals OT Goal Formulation: With patient Time For Goal Achievement: 08/18/11 Potential to Achieve Goals: Good ADL Goals Pt Will Perform Lower Body Bathing: with set-up;with caregiver independent in assisting;Sit to stand from  chair;with adaptive equipment;with cueing (comment type and amount) ADL Goal: Lower Body Bathing - Progress: Progressing toward goals Pt Will Perform Lower Body Dressing: with set-up;with caregiver independent in assisting;Sit to stand from chair;with adaptive equipment ADL Goal: Lower Body Dressing - Progress: Progressing toward goals Pt Will Transfer to Toilet: with modified independence;with DME;3-in-1;Ambulation ADL Goal: Toilet Transfer - Progress: Progressing toward goals Pt Will Perform Tub/Shower Transfer: Shower transfer;with supervision;with DME;Other (comment) ADL Goal: Tub/Shower Transfer - Progress: Progressing toward goals Arm Goals Pt Will Complete Theraband Exer: Independently;to increase strength;Bilateral upper extremities;2 sets;Level 3 Theraband Arm Goal: Theraband Exercises - Progress: Progressing toward goal  Visit Information  Last OT Received On: 08/05/11 Assistance Needed: +1    Subjective Data   I'm feeling better.   Prior Functioning       Cognition       Mobility Bed Mobility Bed Mobility: Supine to Sit Rolling Left: 5: Supervision Supine to Sit: 5: Supervision Details for Bed Mobility Assistance: vc for correct positioning Transfers Transfers: Sit to Stand;Stand to Sit Sit to Stand: 5: Supervision Stand to Sit: 5: Supervision Details for Transfer Assistance: observed carry over from earlier session   Exercises General Exercises - Upper Extremity Shoulder Flexion: Strengthening;Both;10 reps;Seated;Theraband Shoulder Extension: Strengthening;Both;Seated Shoulder ABduction: Both;10 reps;Seated Shoulder ADduction: Strengthening;Both;15 reps;Seated Elbow Flexion: Strengthening;Both;10 reps;Seated Elbow Extension: Strengthening;Both;10 reps;Seated;Theraband Theraband Level (Elbow Extension): Level 1 (Yellow)  Balance    End of Session OT - End of Session Equipment Utilized During Treatment: Gait belt Activity Tolerance: Patient tolerated treatment  well Patient left: in chair;with call bell/phone within reach;with family/visitor present   ,HILLARY 08/05/2011, 6:10 PM [facility], OTR/L  [phone] 08/05/2011

## 2011-08-05 NOTE — Progress Notes (Signed)
Occupational Therapy Treatment Patient Details Name: [name] MRN: [id number] DOB: [birth year] Today's Date: 08/05/2011 Time: 8295-6213 OT Time Calculation (min): 30 min  OT Assessment / Plan / Recommendation Comments on Treatment Session Focus of treatment sesison on use of AE for ADL. Pt required min A with use of equiment and would benefit from further sessions in use of AE and E conservation techniques. Son was present and was educated also. Son able to repeat use of AE.Also discussed need for DME. Pt will benefit from 3 in 1 at D/C. Explained to pt and son how to use equipment for shower and BR. Pt issued elastic laces for shoes. Given information on availability of AE in gift shop. Making good progress.    Follow Up Recommendations  Home health OT    Barriers to Discharge       Equipment Recommendations  Rolling walker with 5" wheels;3 in 1 bedside comode    Recommendations for Other Services    Frequency Min 2X/week   Plan Discharge plan remains appropriate    Precautions / Restrictions Precautions Precautions: Fall   Pertinent Vitals/Pain No complaints    ADL  Lower Body Dressing: Performed;Minimal assistance;Other (comment) (with reacher and sock aid) Where Assessed - Lower Body Dressing: Unsupported sitting Toilet Transfer: Performed;Supervision/safety Acupuncturist: Comfort height toilet Toileting - Clothing Manipulation and Hygiene: Performed;Modified independent Where Assessed - Toileting Clothing Manipulation and Hygiene: Standing Equipment Used: Rolling walker;Sock aid;Long-handled sponge;Reacher;Gait belt Transfers/Ambulation Related to ADLs: superivison @ RW level ADL Comments: limited due to pain/discomfort. Increased independence with AE      OT Goals Acute Rehab OT Goals OT Goal Formulation: With patient Time For Goal Achievement: 08/18/11 Potential to Achieve Goals: Good ADL Goals Pt Will Perform Lower Body Bathing: with set-up;with  caregiver independent in assisting;Sit to stand from chair;with adaptive equipment;with cueing (comment type and amount) ADL Goal: Lower Body Bathing - Progress: Progressing toward goals Pt Will Perform Lower Body Dressing: with set-up;with caregiver independent in assisting;Sit to stand from chair;with adaptive equipment ADL Goal: Lower Body Dressing - Progress: Progressing toward goals Pt Will Transfer to Toilet: with modified independence;with DME;3-in-1;Ambulation ADL Goal: Toilet Transfer - Progress: Progressing toward goals Pt Will Perform Tub/Shower Transfer: Shower transfer;with supervision;with DME;Other (comment) ADL Goal: Tub/Shower Transfer - Progress: Progressing toward goals Arm Goals Pt Will Complete Theraband Exer: Independently;to increase strength;Bilateral upper extremities;2 sets;Level 3 Theraband Arm Goal: Theraband Exercises - Progress: Progressing toward goal  Visit Information  Last OT Received On: 08/05/11 Assistance Needed: +1    Subjective Data      Prior Functioning       Cognition       Mobility Bed Mobility Bed Mobility: Supine to Sit Rolling Left: 5: Supervision Supine to Sit: 5: Supervision Details for Bed Mobility Assistance: vc for correct positioning Transfers Transfers: Sit to Stand;Stand to Sit Sit to Stand: 5: Supervision Details for Transfer Assistance: improved performance with hand placement   Exercises    Balance    End of Session OT - End of Session Equipment Utilized During Treatment: Gait belt Activity Tolerance: Patient limited by fatigue Patient left: in chair;with call bell/phone within reach;with family/visitor present   ,HILLARY 08/05/2011, 6:05 PM [facility], OTR/L  [phone] 08/05/2011

## 2011-08-05 NOTE — Progress Notes (Addendum)
ANTICOAGULATION CONSULT NOTE - Initial Consult  Pharmacy Consult for coumadin  Indication: afib  Allergies  Allergen Reactions  . Gemfibrozil Swelling    REACTION: Angioedema  . Latex Other (See Comments)    Blisters where touched or applied  . Penicillins Hives    Will spread in patches all over the body.  . Adhesive (Tape) Other (See Comments)    Will blister skin where applied - do not use BAND-AIDS.  Marland Kitchen Codeine Hives    Will spread in patches all over the body.    Patient Measurements: Height: 5\' 3"  (160 cm) Weight: 193 lb 14.4 oz (87.952 kg) IBW/kg (Calculated) : 52.4  VitalSigns:  Temp: 99.5 F (37.5 C) (05/28 2211) Temp src: Oral (05/28 2211) BP: 140/58 mmHg (05/28 2211) Pulse Rate: 115  (05/28 2211)  Labs:  Basename 08/04/11 0520 08/03/11 0555 08/02/11 0716  HGB 13.4 13.5 --  HCT 38.2 40.1 41.9  PLT 280 233 236  APTT -- -- --  LABPROT 13.6 13.6 13.9  INR 1.02 1.02 1.05  HEPARINUNFRC -- -- --  CREATININE 0.52 0.48* 0.54  CKTOTAL -- -- --  CKMB -- -- --  TROPONINI -- -- --    Estimated Creatinine Clearance: 75.7 ml/min (by C-G formula based on Cr of 0.52).   Medical History: Past Medical History  Diagnosis Date  . Depression   . Obesity   . Skin neoplasm   . Bunion   . Carotid stenosis   . Fibromyalgia   . Internal hemorrhoid   . GERD (gastroesophageal reflux disease)   . Chronic gastritis   . Transaminase or LDH elevation   . Mild cognitive impairment   . Hyperlipidemia   . Fatty liver   . Lumbar back pain   . Diabetic peripheral neuropathy   . Diverticulosis   . Esophageal stricture   . Hypertension   . Cholecystitis     s/p cholecystectomy  . Sarcoidosis   . Fibromyalgia   . CHF (congestive heart failure)   . Skin cancer 1990's    "front of my right leg"  . Atrial fibrillation   . OSA (obstructive sleep apnea) 2003    "can't sleep w/that darm machine"  . Exertional dyspnea   . Hypothyroidism   . Type II diabetes mellitus   .  TIA (transient ischemic attack)     07/29/11 pt denies this histor  . H/O hiatal hernia   . Epileptic seizure, tonic     .No meds since age of 61.  . Osteoarthritis     Medications:  Prescriptions prior to admission  Medication Sig Dispense Refill  . ALPRAZolam (XANAX) 0.5 MG tablet Take 1 tablet (0.5 mg total) by mouth at bedtime as needed for sleep or anxiety.  6 tablet  0  . amitriptyline (ELAVIL) 50 MG tablet Take 50 mg by mouth at bedtime.      Marland Kitchen atenolol (TENORMIN) 25 MG tablet Take 25 mg by mouth 2 (two) times daily.      . B Complex-Biotin-FA (SUPER B-50 B COMPLEX PO) Take 1 tablet by mouth 2 (two) times daily.       . Calcium Carbonate-Vitamin D (CALCIUM 600+D) 600-400 MG-UNIT per tablet Take 1 tablet by mouth 2 (two) times daily with a meal.       . cyclobenzaprine (FLEXERIL) 5 MG tablet Take 5 mg by mouth 2 (two) times daily as needed. For fibromyalgia      . digoxin (LANOXIN) 0.25 MG tablet Take 250 mcg by  mouth daily.      Marland Kitchen diltiazem (DILACOR XR) 180 MG 24 hr capsule Take 180 mg by mouth daily.      Marland Kitchen enoxaparin (LOVENOX) 120 MG/0.8ML injection Inject 0.8 mLs (120 mg total) into the skin daily. Take as directed by coumadin clinic, inject daily pre op briding as directed  10 Syringe  1  . fish oil-omega-3 fatty acids 1000 MG capsule Take 1 g by mouth 2 (two) times daily.       Marland Kitchen FLUoxetine (PROZAC) 20 MG capsule Take 20 mg by mouth daily.      . furosemide (LASIX) 40 MG tablet Take 40 mg by mouth daily.      Marland Kitchen gabapentin (NEURONTIN) 100 MG capsule Take 100 mg by mouth 2 (two) times daily.      Marland Kitchen glipiZIDE (GLUCOTROL) 10 MG tablet Take 10 mg by mouth 2 (two) times daily before a meal.      . HYDROcodone-acetaminophen (VICODIN) 5-500 MG per tablet Take 1 tablet by mouth every 12 (twelve) hours as needed. For pain  90 tablet  1  . levothyroxine (SYNTHROID, LEVOTHROID) 75 MCG tablet Take 1 tablet (75 mcg total) by mouth daily.  90 tablet  3  . lisinopril (PRINIVIL,ZESTRIL) 10 MG  tablet Take 1 tablet (10 mg total) by mouth daily.  90 tablet  3  . metFORMIN (GLUCOPHAGE) 1000 MG tablet Take 1 tablet (1,000 mg total) by mouth 2 (two) times daily with a meal.  180 tablet  3  . Multiple Vitamin (MULTIVITAMIN) capsule Take 1 capsule by mouth daily.       . niacin (NIASPAN) 500 MG CR tablet Take 2 tablets (1,000 mg total) by mouth 2 (two) times daily.  360 tablet  3  . omeprazole (PRILOSEC) 20 MG capsule Take 40 mg by mouth as needed. For acid reflux      . potassium chloride (K-DUR) 10 MEQ tablet Take 10 mEq by mouth daily.      . pravastatin (PRAVACHOL) 80 MG tablet Take 80 mg by mouth daily.      Marland Kitchen albuterol (PROVENTIL HFA;VENTOLIN HFA) 108 (90 BASE) MCG/ACT inhaler Inhale 2 puffs into the lungs every 6 (six) hours as needed. For shortness of breath      . warfarin (COUMADIN) 5 MG tablet Take 2.5-5 mg by mouth daily. Takes 5 mg on Monday Wednesday and Friday. Takes 2.5mg  on Tuesday, Thursday, Saturday, and Sunday.        Assessment:   61 yo f s/p ventral hernia repair who resumed bridge therapy with lovenox and coumadin for afib on 07/30/11 x 1 dose.  On 07/31/11 coumadin was stopped and full dose lovenox was decreased to prophylaxis dose of 40 mg daily because of abdominal distention - r/o SBO vs ileus.   Now to resume lovenox 120 mg daily and coumadin for afib on 08/05/11.   I have reviewed the [place] coumadin clinic note from 07/20/11.  Her coumadin dose was 63 mg MWF and 2.5 mg TTSS.  She was started on LMWH 120 mg q24 as an outpatient so coumadin could be held for hernia repair.     The coumadin clinic plan for post-op was coumadin 7.5 mg x 2 days and lovenox until INR > 2.0.  Her weight today is 88 kg.  Lovenox 120 mg q24 is a little below 1.5 mg/kg/day but comes in a prefilled syringe in this dose.    Goal of Therapy:  INR 2-3 Monitor platelets by anticoagulation protocol: Yes  Plan:  LMWH 120 mg q24 (as per [place] coum clinic outpt dose) - ordered by Dr.  Derrell Lolling Coumadin 7.5 mg po x 1 dose today LMWH bridge therapy until INR > 2 CBC q72 hrs while on LMWH for pltc monitoring - she currently has a daily CBC ordered by MD   Herby Abraham, Pharm.D. [phone] 08/05/2011 6:11 AM

## 2011-08-06 ENCOUNTER — Encounter (HOSPITAL_COMMUNITY): Payer: Self-pay | Admitting: Radiology

## 2011-08-06 ENCOUNTER — Inpatient Hospital Stay (HOSPITAL_COMMUNITY): Payer: Medicare Other

## 2011-08-06 LAB — BASIC METABOLIC PANEL
CO2: 22 mEq/L (ref 19–32)
Calcium: 9.5 mg/dL (ref 8.4–10.5)
Chloride: 96 mEq/L (ref 96–112)
Glucose, Bld: 150 mg/dL — ABNORMAL HIGH (ref 70–99)
Potassium: 3.8 mEq/L (ref 3.5–5.1)
Sodium: 134 mEq/L — ABNORMAL LOW (ref 135–145)

## 2011-08-06 LAB — URINE MICROSCOPIC-ADD ON

## 2011-08-06 LAB — URINALYSIS, ROUTINE W REFLEX MICROSCOPIC
Glucose, UA: NEGATIVE mg/dL
Hgb urine dipstick: NEGATIVE
Ketones, ur: NEGATIVE mg/dL
Nitrite: NEGATIVE
Protein, ur: 30 mg/dL — AB
Specific Gravity, Urine: 1.031 — ABNORMAL HIGH (ref 1.005–1.030)
pH: 5.5 (ref 5.0–8.0)

## 2011-08-06 LAB — GLUCOSE, CAPILLARY
Glucose-Capillary: 104 mg/dL — ABNORMAL HIGH (ref 70–99)
Glucose-Capillary: 127 mg/dL — ABNORMAL HIGH (ref 70–99)
Glucose-Capillary: 129 mg/dL — ABNORMAL HIGH (ref 70–99)
Glucose-Capillary: 161 mg/dL — ABNORMAL HIGH (ref 70–99)

## 2011-08-06 LAB — CBC
HCT: 37.7 % (ref 36.0–46.0)
Hemoglobin: 13.2 g/dL (ref 12.0–15.0)
MCH: 32.2 pg (ref 26.0–34.0)
MCHC: 35 g/dL (ref 30.0–36.0)
Platelets: 351 10*3/uL (ref 150–400)
RBC: 4.1 MIL/uL (ref 3.87–5.11)
WBC: 16.8 10*3/uL — ABNORMAL HIGH (ref 4.0–10.5)

## 2011-08-06 LAB — PROTIME-INR
INR: 1.36 (ref 0.00–1.49)
Prothrombin Time: 17 seconds — ABNORMAL HIGH (ref 11.6–15.2)

## 2011-08-06 MED ORDER — PANTOPRAZOLE SODIUM 40 MG IV SOLR
40.0000 mg | INTRAVENOUS | Status: DC
  Administered 2011-08-07 – 2011-08-16 (×10): 40 mg via INTRAVENOUS
  Filled 2011-08-06 (×11): qty 40

## 2011-08-06 MED ORDER — ENOXAPARIN SODIUM 150 MG/ML ~~LOC~~ SOLN
1.5000 mg/kg | SUBCUTANEOUS | Status: DC
  Administered 2011-08-07 – 2011-08-17 (×11): 130 mg via SUBCUTANEOUS
  Filled 2011-08-06 (×15): qty 1

## 2011-08-06 MED ORDER — LEVOTHYROXINE SODIUM 100 MCG IV SOLR
37.5000 ug | Freq: Every day | INTRAVENOUS | Status: DC
  Administered 2011-08-07 – 2011-08-10 (×4): 38 ug via INTRAVENOUS
  Filled 2011-08-06 (×5): qty 1.9

## 2011-08-06 MED ORDER — DIGOXIN 0.25 MG/ML IJ SOLN
0.1250 mg | Freq: Every day | INTRAMUSCULAR | Status: DC
  Administered 2011-08-07 – 2011-08-09 (×3): 0.125 mg via INTRAVENOUS
  Filled 2011-08-06 (×4): qty 0.5

## 2011-08-06 MED ORDER — INSULIN ASPART 100 UNIT/ML ~~LOC~~ SOLN
0.0000 [IU] | SUBCUTANEOUS | Status: DC
  Administered 2011-08-07 (×2): 3 [IU] via SUBCUTANEOUS
  Administered 2011-08-07 (×2): 2 [IU] via SUBCUTANEOUS
  Administered 2011-08-07 – 2011-08-08 (×6): 3 [IU] via SUBCUTANEOUS
  Administered 2011-08-08: 2 [IU] via SUBCUTANEOUS
  Administered 2011-08-09 (×5): 3 [IU] via SUBCUTANEOUS
  Administered 2011-08-09: 2 [IU] via SUBCUTANEOUS
  Administered 2011-08-10 (×2): 3 [IU] via SUBCUTANEOUS
  Administered 2011-08-10: 5 [IU] via SUBCUTANEOUS
  Administered 2011-08-10: 2 [IU] via SUBCUTANEOUS
  Administered 2011-08-10: 5 [IU] via SUBCUTANEOUS
  Administered 2011-08-10 – 2011-08-11 (×3): 3 [IU] via SUBCUTANEOUS
  Administered 2011-08-11 (×2): 2 [IU] via SUBCUTANEOUS
  Administered 2011-08-11: 5 [IU] via SUBCUTANEOUS
  Administered 2011-08-11: 3 [IU] via SUBCUTANEOUS
  Administered 2011-08-11: 5 [IU] via SUBCUTANEOUS
  Administered 2011-08-12 – 2011-08-14 (×12): 2 [IU] via SUBCUTANEOUS
  Administered 2011-08-14: 3 [IU] via SUBCUTANEOUS
  Administered 2011-08-14 (×2): 2 [IU] via SUBCUTANEOUS
  Administered 2011-08-15: 3 [IU] via SUBCUTANEOUS
  Administered 2011-08-15 (×2): 2 [IU] via SUBCUTANEOUS
  Administered 2011-08-15: 3 [IU] via SUBCUTANEOUS
  Administered 2011-08-15 (×2): 2 [IU] via SUBCUTANEOUS
  Administered 2011-08-16: 3 [IU] via SUBCUTANEOUS
  Administered 2011-08-16 (×2): 2 [IU] via SUBCUTANEOUS
  Administered 2011-08-16: 3 [IU] via SUBCUTANEOUS
  Administered 2011-08-16 – 2011-08-17 (×3): 2 [IU] via SUBCUTANEOUS

## 2011-08-06 MED ORDER — PANTOPRAZOLE SODIUM 40 MG IV SOLR: 40.0000 mg | INTRAVENOUS | Status: DC

## 2011-08-06 MED ORDER — ENOXAPARIN SODIUM 40 MG/0.4ML ~~LOC~~ SOLN
40.0000 mg | SUBCUTANEOUS | Status: DC
  Filled 2011-08-06: qty 0.4

## 2011-08-06 MED ORDER — POTASSIUM CHLORIDE 10 MEQ/100ML IV SOLN
10.0000 meq | INTRAVENOUS | Status: AC
  Administered 2011-08-06: 10 meq via INTRAVENOUS
  Filled 2011-08-06: qty 100
  Filled 2011-08-06: qty 200

## 2011-08-06 MED ORDER — WARFARIN SODIUM 7.5 MG PO TABS
7.5000 mg | ORAL_TABLET | Freq: Once | ORAL | Status: DC
  Filled 2011-08-06: qty 1

## 2011-08-06 MED ORDER — IOHEXOL 300 MG/ML  SOLN
100.0000 mL | Freq: Once | INTRAMUSCULAR | Status: AC | PRN
  Administered 2011-08-06: 100 mL via INTRAVENOUS

## 2011-08-06 MED ORDER — SODIUM CHLORIDE 0.9 % IJ SOLN
10.0000 mL | INTRAMUSCULAR | Status: DC | PRN
  Administered 2011-08-09 – 2011-08-14 (×8): 10 mL
  Administered 2011-08-15: 20 mL
  Administered 2011-08-16 (×2): 10 mL

## 2011-08-06 MED ORDER — ADULT MULTIVITAMIN W/MINERALS CH
1.0000 | ORAL_TABLET | Freq: Every day | ORAL | Status: DC
  Filled 2011-08-06: qty 1

## 2011-08-06 MED ORDER — CIPROFLOXACIN IN D5W 400 MG/200ML IV SOLN
400.0000 mg | Freq: Two times a day (BID) | INTRAVENOUS | Status: DC
  Administered 2011-08-06 – 2011-08-10 (×7): 400 mg via INTRAVENOUS
  Filled 2011-08-06 (×8): qty 200

## 2011-08-06 MED ORDER — METOPROLOL TARTRATE 1 MG/ML IV SOLN
5.0000 mg | Freq: Four times a day (QID) | INTRAVENOUS | Status: DC
  Administered 2011-08-06 – 2011-08-10 (×13): 5 mg via INTRAVENOUS
  Filled 2011-08-06 (×20): qty 5

## 2011-08-06 MED ORDER — IOHEXOL 300 MG/ML  SOLN
20.0000 mL | INTRAMUSCULAR | Status: AC
  Administered 2011-08-06 (×2): 20 mL via ORAL

## 2011-08-06 MED ORDER — INSULIN REGULAR HUMAN 100 UNIT/ML IJ SOLN
INTRAVENOUS | Status: AC
  Administered 2011-08-06: 20:00:00 via INTRAVENOUS
  Filled 2011-08-06: qty 1000

## 2011-08-06 NOTE — Progress Notes (Signed)
ANTICOAGULATION CONSULT NOTE - Follow Up Consult  Pharmacy Consult for Lovenox >> Coumadin Indication: atrial fibrillation  Allergies  Allergen Reactions  . Gemfibrozil Swelling    REACTION: Angioedema  . Latex Other (See Comments)    Blisters where touched or applied  . Penicillins Hives    Will spread in patches all over the body.  . Adhesive (Tape) Other (See Comments)    Will blister skin where applied - do not use BAND-AIDS.  Marland Kitchen Codeine Hives    Will spread in patches all over the body.    Patient Measurements: Height: 5\' 3"  (160 cm) Weight: 193 lb 14.4 oz (87.952 kg) IBW/kg (Calculated) : 52.4   Vital Signs: Temp: 98 F (36.7 C) (05/30 0622) Temp src: Oral (05/30 0622) BP: 164/82 mmHg (05/30 0622) Pulse Rate: 96  (05/30 0622)  Labs:  Basename 08/06/11 0630 08/05/11 0626 08/04/11 0520  HGB 13.2 14.1 --  HCT 37.7 40.2 38.2  PLT 351 287 280  APTT -- -- --  LABPROT 17.0* 14.7 13.6  INR 1.36 1.13 1.02  HEPARINUNFRC -- -- --  CREATININE 0.36* 0.50 0.52  CKTOTAL -- -- --  CKMB -- -- --  TROPONINI -- -- --    Estimated Creatinine Clearance: 75.7 ml/min (by C-G formula based on Cr of 0.36).  Assessment: Pt continues on anticoagulation for atrial fibrillation. Lovenox bridge d/t Coumadin interruption for surgery. INR is below goal. H/H decreased slightly, plts stable. Home Coumadin dose: 5 mg MWF and 2.5 mg TTSS  Goal of Therapy:  INR 2-3 Monitor platelets by anticoagulation protocol: Yes   Plan:  Lovenox to continue until INR >2, then preferrable overlap x 48H- dose slightly lower than calculated 130mg  q 24h. - Coumadin 7.5mg  again today - Will f/up daily.  Lakeithia Rasor K 08/06/2011,8:49 AM

## 2011-08-06 NOTE — Progress Notes (Signed)
PARENTERAL NUTRITION CONSULT NOTE - INITIAL  Pharmacy Consult for TPN Indication: SBO vs Ileus s/p ventral hernia repar  Allergies  Allergen Reactions  . Gemfibrozil Swelling    REACTION: Angioedema  . Latex Other (See Comments)    Blisters where touched or applied  . Penicillins Hives    Will spread in patches all over the body.  . Adhesive (Tape) Other (See Comments)    Will blister skin where applied - do not use BAND-AIDS.  Marland Kitchen Codeine Hives    Will spread in patches all over the body.    Patient Measurements: Height: 5\' 3"  (160 cm) Weight: 193 lb 14.4 oz (87.952 kg) IBW/kg (Calculated) : 52.4  Adjusted Body Weight: 63.08 BMI: 34  Vital Signs: Temp: 98 F (36.7 C) (05/30 0622) Temp src: Oral (05/30 0622) BP: 164/82 mmHg (05/30 0622) Pulse Rate: 96  (05/30 0622) Intake/Output from previous day: 05/29 0701 - 05/30 0700 In: 1360 [P.O.:560; I.V.:800] Out: -  Intake/Output from this shift: Total I/O In: -  Out: 1300 [Urine:300; Emesis/NG output:1000]  Labs:  [facility] 08/06/11 0630 08/05/11 0626 08/04/11 0520  WBC 16.8* 17.7* 12.6*  HGB 13.2 14.1 13.4  HCT 37.7 40.2 38.2  PLT 351 287 280  APTT -- -- --  INR 1.36 1.13 1.02     Basename 08/06/11 0630 08/05/11 0626 08/04/11 0520  NA 134* 136 135  K 3.8 3.7 3.9  CL 96 98 98  CO2 22 21 28   GLUCOSE 150* 165* 149*  BUN 7 7 6   CREATININE 0.36* 0.50 0.52  LABCREA -- -- --  CREAT24HRUR -- -- --  CALCIUM 9.5 10.1 9.5  MG -- -- --  PHOS -- -- --  PROT -- 7.1 --  ALBUMIN -- 3.0* --  AST -- 15 --  ALT -- 15 --  ALKPHOS -- 90 --  BILITOT -- 1.0 --  BILIDIR -- -- --  IBILI -- -- --  PREALBUMIN -- -- --  TRIG -- -- --  CHOLHDL -- -- --  CHOL -- -- --   Estimated Creatinine Clearance: 75.7 ml/min (by C-G formula based on Cr of 0.36).    Basename 08/06/11 0808 08/06/11 0012 08/05/11 2011  GLUCAP 161* 127* 162*    Medical History: Past Medical History  Diagnosis Date  . Depression   . Obesity   .  Skin neoplasm   . Bunion   . Carotid stenosis   . Fibromyalgia   . Internal hemorrhoid   . GERD (gastroesophageal reflux disease)   . Chronic gastritis   . Transaminase or LDH elevation   . Mild cognitive impairment   . Hyperlipidemia   . Fatty liver   . Lumbar back pain   . Diabetic peripheral neuropathy   . Diverticulosis   . Esophageal stricture   . Hypertension   . Cholecystitis     s/p cholecystectomy  . Sarcoidosis   . Fibromyalgia   . CHF (congestive heart failure)   . Skin cancer 1990's    "front of my right leg"  . Atrial fibrillation   . OSA (obstructive sleep apnea) 2003    "can't sleep w/that darm machine"  . Exertional dyspnea   . Hypothyroidism   . Type II diabetes mellitus   . TIA (transient ischemic attack)     07/29/11 pt denies this histor  . H/O hiatal hernia   . Epileptic seizure, tonic     .No meds since age of 1.  . Osteoarthritis    Medications:  Scheduled:    . amitriptyline  50 mg Oral QHS  . atenolol  25 mg Oral BID  . bisacodyl  10 mg Rectal Daily  . digoxin  0.25 mg Oral Daily  . diltiazem  180 mg Oral Daily  . enoxaparin (LOVENOX) injection  120 mg Subcutaneous Q24H  . FLUoxetine  20 mg Oral Daily  . gabapentin  100 mg Oral BID  . glipiZIDE  10 mg Oral BID AC  . insulin aspart  0-15 Units Subcutaneous TID WC & HS  . iohexol  20 mL Oral Q1 Hr x 2  . ketorolac  1 drop Right Eye BID  . levothyroxine  75 mcg Oral QAC breakfast  . lidocaine  20 mL Mouth/Throat Q4H  . lisinopril  10 mg Oral Daily  . metFORMIN  1,000 mg Oral BID WC  . pantoprazole  40 mg Oral Q1200  . simvastatin  10 mg Oral q1800  . warfarin  7.5 mg Oral ONCE-1800  . warfarin  7.5 mg Oral ONCE-1800  . Warfarin - Pharmacist Dosing Inpatient   Does not apply q1800  . DISCONTD: metoCLOPramide  10 mg Oral TID AC & HS   Infusions:    . dextrose 5 % and 0.45 % NaCl with KCl 20 mEq/L 100 mL/hr at 08/06/11 0950   PRN: albuterol, guaiFENesin, HYDROcodone-acetaminophen,  HYDROmorphone (DILAUDID) injection, magic mouthwash, ondansetron (ZOFRAN) IV, promethazine  Insulin Requirements in the past 24 hours:  12 units  Current Nutrition:  NPO   Assessment: 26 yof with abdominal xray 5/30 showing SBO vs ileus s/p ventral hernia repair on 5/22. Currently NPO diet but taking medications PO. Noted patient with positive flatus and bowel movement.     GI: Post op day #8 s/p ventral hernia repair. Ab xray 5/30 with PSBO vs Ileus. Pt passing flatus and 3 stools within 24 hours. Persistent coughing and nausea. HX GERD/gastritis on ppi Endo: Hx Hypothyroidism/DM on home synthroid and TSH wnl. On metformin, glipizide, and SSI. CBGs ranging 118-170 (goal of <150 once on TPN). HbA1c 6.3.  Lytes: Hyponatremia (134) and has been trending down, K 3.8 (goal of >4 in ileus), Other lytes wnl including corr ca at 10.3. Renal: Scr 0.36 (stable), UOP not being accurately documented.  Pulm: RA Cards: Hx Afib/HTN/HLD/CHF (no recent echo), BP elevated, tachycardic on atenolol, digoxin, diltiazem, lisinopril, statin Hepatobil: Baseline Alk phos/LFTs WNL Neuro: Hx of depression/anxiety on elavil qhs, fluoxetine  ID: Afebrile, WBC 16.8 no abx, Urine culture sent Best Practices: Coumadin/Lovenox, po ppi, home meds resumed   Nutritional Goals:  Pending per RD  estimated at 1260-1580 kCal, 80 grams of protein per day  Plan:  -Begin Clinimix 5/15 with initial rate of 16ml/hr  -F/u tolerance tomorrow for increase per RD assessment/goals -Multivitamin PO daily -IV trace elements and lipids 20% MWF due to national shortage  -10 units of insulin in TPN -Monitor CBG tomorrow to determine insulin requirements -KCl 10 mEq IV x 4 runs -Order TPN labs, will follow up RD assessment  Thank you,  Brett Fairy, PharmD Pager: [phone]  08/06/2011 12:39 PM

## 2011-08-06 NOTE — Discharge Instructions (Signed)
Home health to be provided by [facility] [phone]

## 2011-08-06 NOTE — Progress Notes (Signed)
INITIAL ADULT NUTRITION ASSESSMENT Date: 08/06/2011   Time: 12:43 PM Reason for Assessment: NPO/liquid diet x 8 days  ASSESSMENT: Female 61 y.o.  Dx: Ventral hernia  Hx:  Past Medical History  Diagnosis Date  . Depression   . Obesity   . Skin neoplasm   . Bunion   . Carotid stenosis   . Fibromyalgia   . Internal hemorrhoid   . GERD (gastroesophageal reflux disease)   . Chronic gastritis   . Transaminase or LDH elevation   . Mild cognitive impairment   . Hyperlipidemia   . Fatty liver   . Lumbar back pain   . Diabetic peripheral neuropathy   . Diverticulosis   . Esophageal stricture   . Hypertension   . Cholecystitis     s/p cholecystectomy  . Sarcoidosis   . Fibromyalgia   . CHF (congestive heart failure)   . Skin cancer 1990's    "front of my right leg"  . Atrial fibrillation   . OSA (obstructive sleep apnea) 2003    "can't sleep w/that darm machine"  . Exertional dyspnea   . Hypothyroidism   . Type II diabetes mellitus   . TIA (transient ischemic attack)     07/29/11 pt denies this histor  . H/O hiatal hernia   . Epileptic seizure, tonic     .No meds since age of 79.  . Osteoarthritis    Past Surgical History  Procedure Date  . Hiatal hernia repair 1990    "had to have scar tissue removed 6 months after repair"  . Esophageal dilation   . Cataract extraction w/phaco 10/27/2010    Procedure: CATARACT EXTRACTION PHACO AND INTRAOCULAR LENS PLACEMENT (IOC);  Surgeon: Susa Simmonds;  Location: [facility];  Service: Ophthalmology;  Laterality: Left;  CDE- 1.78  . Cataract extraction w/phaco 07/13/2011    Procedure: CATARACT EXTRACTION PHACO AND INTRAOCULAR LENS PLACEMENT (IOC);  Surgeon: Susa Simmonds, MD;  Location: [facility];  Service: Ophthalmology;  Laterality: Right;  CDE:  1.65  . Liposuction 1992  . Inverted nipples 1992  . Breast surgery ~ 2010    excision milk duct; right breast  . Foot surgery ~ 2011    ~straightened toe left foot & scraped bone below  big toe"  . Foot surgery ~ 2011    "scraped bone of big toe; shortened middle toe; right foot"  . Cholecystectomy 1980's  . Dilation and curettage of uterus 1970; 1976  . Hernia repair 07/29/11    multiple incarcerated VHR  . Inguinal hernia repair 1990's    left  . Abdominal hysterectomy 1980's    partial  . Tubal ligation 1976  . Bilateral oophorectomy 1980's    "after partial hysterectomy"  . Skin cancer excision 1990's    "front side of right shin"  . Ventral hernia repair 07/29/2011    Procedure: LAPAROSCOPIC VENTRAL HERNIA;  Surgeon: Ernestene Mention, MD;  Location: [facility];  Service: General;  Laterality: N/A;  multiple incarcerated hernias with mesh    Related Meds:  Scheduled Meds:   . amitriptyline  50 mg Oral QHS  . atenolol  25 mg Oral BID  . bisacodyl  10 mg Rectal Daily  . digoxin  0.25 mg Oral Daily  . diltiazem  180 mg Oral Daily  . enoxaparin (LOVENOX) injection  120 mg Subcutaneous Q24H  . FLUoxetine  20 mg Oral Daily  . gabapentin  100 mg Oral BID  . glipiZIDE  10 mg Oral BID AC  .  insulin aspart  0-15 Units Subcutaneous TID WC & HS  . iohexol  20 mL Oral Q1 Hr x 2  . ketorolac  1 drop Right Eye BID  . levothyroxine  75 mcg Oral QAC breakfast  . lidocaine  20 mL Mouth/Throat Q4H  . lisinopril  10 mg Oral Daily  . metFORMIN  1,000 mg Oral BID WC  . pantoprazole  40 mg Oral Q1200  . simvastatin  10 mg Oral q1800  . warfarin  7.5 mg Oral ONCE-1800  . warfarin  7.5 mg Oral ONCE-1800  . Warfarin - Pharmacist Dosing Inpatient   Does not apply q1800  . DISCONTD: metoCLOPramide  10 mg Oral TID AC & HS   Continuous Infusions:   . dextrose 5 % and 0.45 % NaCl with KCl 20 mEq/L 100 mL/hr at 08/06/11 0950   PRN Meds:.albuterol, guaiFENesin, HYDROcodone-acetaminophen, HYDROmorphone (DILAUDID) injection, magic mouthwash, ondansetron (ZOFRAN) IV, promethazine   Ht: 5\' 3"  (160 cm)  Wt: 193 lb 14.4 oz (87.952 kg)  Ideal Wt: 52.2 kg % Ideal Wt: 168%  Usual Wt:  unable to assess % Usual Wt: unable to to assess  Body mass index is 34.35 kg/(m^2).  Food/Nutrition Related Hx: presented (5/22) for repair of ventral incisional hernia  Labs:  CMP     Component Value Date/Time   NA 134* 08/06/2011 0630   K 3.8 08/06/2011 0630   CL 96 08/06/2011 0630   CO2 22 08/06/2011 0630   GLUCOSE 150* 08/06/2011 0630   BUN 7 08/06/2011 0630   CREATININE 0.36* 08/06/2011 0630   CREATININE 0.75 05/08/2011 1547   CALCIUM 9.5 08/06/2011 0630   PROT 7.1 08/05/2011 0626   ALBUMIN 3.0* 08/05/2011 0626   AST 15 08/05/2011 0626   ALT 15 08/05/2011 0626   ALKPHOS 90 08/05/2011 0626   BILITOT 1.0 08/05/2011 0626   GFRNONAA >90 08/06/2011 0630   GFRAA >90 08/06/2011 0630    CBC    Component Value Date/Time   WBC 16.8* 08/06/2011 0630   RBC 4.10 08/06/2011 0630   HGB 13.2 08/06/2011 0630   HCT 37.7 08/06/2011 0630   PLT 351 08/06/2011 0630   MCV 92.0 08/06/2011 0630   MCH 32.2 08/06/2011 0630   MCHC 35.0 08/06/2011 0630   RDW 12.5 08/06/2011 0630   LYMPHSABS 1.9 08/05/2011 0626   MONOABS 1.5* 08/05/2011 0626   EOSABS 0.2 08/05/2011 0626   BASOSABS 0.0 08/05/2011 0626    Intake: 0-50% of meals Output:   Intake/Output Summary (Last 24 hours) at 08/06/11 1252 Last data filed at 08/06/11 1100  Gross per 24 hour  Intake   1240 ml  Output   1300 ml  Net    -60 ml   3 stools in past 24 hrs, +flatus  Now with NGT to suction  Diet Order: NPO  Supplements/Tube Feeding: none at this time  IVF:    dextrose 5 % and 0.45 % NaCl with KCl 20 mEq/L Last Rate: 100 mL/hr at 08/06/11 0950    Estimated Nutritional Needs:   Kcal: 1750-1970 Protein: 62-74g Fluid: ~1.8 L/day  Pt admitted for repair of ventral hernia with slow progression post-op.  Pt has been unable to advance diet past full liquids with decreased PO intake 0-50% of meals due to nausea.  Pt now with NGT to suction with large output returned.   RN in with pt at time of visit, and has just assisted in getting into a  chair.  Unable to obtain new wt at  this time.  NUTRITION DIAGNOSIS: -Inadequate oral intake (NI-2.1).  Status: Ongoing  RELATED TO: omission of energy dense foods  AS EVIDENCE BY: pt on liquid diet  MONITORING/EVALUATION(Goals): 1.  Food/Beverage; diet advancement per MD discretion 2.  Parenteral nutrition; initiation with tolerance.  EDUCATION NEEDS: -No education needs identified at this time  INTERVENTION: 1.  Parenteral nutrition; per PharmD management.  No initiation orders to review at this time.  Dietitian #: [phone]  DOCUMENTATION CODES Per approved criteria  -Obesity Unspecified    Loyce Dys [facility] 08/06/2011, 12:43 PM

## 2011-08-06 NOTE — Progress Notes (Signed)
Subjective: 61 yo with DM, HTN, chronic afib who is recovering from laparoscopic repair of multiple ventral hernias on 07/29/2011.  We were previously following this patient postoperatively as consultants managing her afib, HTN, and DM in the setting of her being NPO with NGT in place.  We have been asked by surgeon today to reconsult on this patient.  [name] reports that her NGT was removed early yesterday.  She reports doing well with clear liquids and jello yesterday.  However, she reports today very little appetite, with significant nausea after only about one tablespoon of ice cream and liquids.  Decision was made by surgery team to reinsert NGT today, which is now in place.  She is again NPO.  CT abd/pelvis with contrast this afternoon showed focal postoperative adynamic ileus remains.    We have been asked to manage her multiple chronic medical conditions, namely her atrial fibrillation, HTN, and DM, with her now being NPO with NGT.  She reports swallowing diltiazem, metformin, and protonix has been particularly difficult with the NGT.     Objective: Vital signs in last 24 hours: Filed Vitals:   08/05/11 0626 08/05/11 1400 08/05/11 2150 08/06/11 0622  BP: 134/76 100/72 110/74 164/82  Pulse: 100 109 102 96  Temp: 99.4 F (37.4 C) 98.2 F (36.8 C) 98.2 F (36.8 C) 98 F (36.7 C)  TempSrc: Oral Oral  Oral  Resp: 18 18 18 18   Height:      Weight:      SpO2: 94% 94% 96% 96%   Weight change:   Intake/Output Summary (Last 24 hours) at 08/06/11 1551 Last data filed at 08/06/11 1500  Gross per 24 hour  Intake    880 ml  Output   2100 ml  Net  -1220 ml     General: resting in bed comfortable.  NGT in place HEENT: PERRL, EOMI, no scleral icterus.  Cardiac: borderline fast rate, irregularly irregular rhythm, no rubs, murmurs or gallops  Pulm: clear to auscultation bilaterally, moving normal volumes of air   Abd: Positive bowel sounds, bordering on hyperactive.  Soft, mildly  tender to palpation, mildly distended.  Multiple laparoscopic incisions healing well.  OR incision marker markings still visible diffusely.     Ext: warm and well perfused, no pedal edema.  No tenderness or swelling.  No erythema.   Neuro: alert and oriented X3, cranial nerves II-XII grossly intact  Lab Results: Basic Metabolic Panel:  Lab 08/06/11 1610 08/05/11 0626  NA 134* 136  K 3.8 3.7  CL 96 98  CO2 22 21  GLUCOSE 150* 165*  BUN 7 7  CREATININE 0.36* 0.50  CALCIUM 9.5 10.1  MG -- --  PHOS -- --   Liver Function Tests:  Lab 08/05/11 0626  AST 15  ALT 15  ALKPHOS 90  BILITOT 1.0  PROT 7.1  ALBUMIN 3.0*   CBC:  Lab 08/06/11 0630 08/05/11 0626 07/31/11 0657  WBC 16.8* 17.7* --  NEUTROABS -- 14.1* 13.2*  HGB 13.2 14.1 --  HCT 37.7 40.2 --  MCV 92.0 92.4 --  PLT 351 287 --   Cardiac Enzymes:  Lab 07/31/11 0657  CKTOTAL 84  CKMB 2.7  CKMBINDEX --  TROPONINI <0.30   CBG:  Lab 08/06/11 1402 08/06/11 0808 08/06/11 0012 08/05/11 2011 08/05/11 1610 08/05/11 1229  GLUCAP 129* 161* 127* 162* 118* 176*   Thyroid Function Tests:  Lab 07/31/11 1016  TSH 0.756  T4TOTAL --  FREET4 --  T3FREE --  THYROIDAB --   Coagulation:  Lab 08/06/11 0630 08/05/11 0626 08/04/11 0520 08/03/11 0555  LABPROT 17.0* 14.7 13.6 13.6  INR 1.36 1.13 1.02 1.02   Urinalysis:  Lab 08/06/11 0900  COLORURINE AMBER*  LABSPEC 1.031*  PHURINE 5.5  GLUCOSEU NEGATIVE  HGBUR NEGATIVE  BILIRUBINUR SMALL*  KETONESUR NEGATIVE  PROTEINUR 30*  UROBILINOGEN 1.0  NITRITE NEGATIVE  LEUKOCYTESUR SMALL*   Studies/Results: Dg Chest 2 View  08/06/2011  *RADIOLOGY REPORT*  Clinical Data: Postop hernia repair 1 week ago, upper abdominal pain  CHEST - 2 VIEW  Comparison: Portable chest x-ray of 07/31/2011  Findings: Linear atelectasis remains at the left lung base. Otherwise the lungs appear clear.  There is some elevation of left hemidiaphragm which appears to be due to significant gaseous  distention of the stomach.  Is there any suspicion of gastric outlet abnormality?  The heart is within normal limits in size.  IMPRESSION:  1.  Persistent linear atelectasis at the left lung base. 2.  Gaseous distention of the stomach with elevation of the left hemidiaphragm.  Consider gastric outlet obstruction.  Original Report Authenticated By: Juline Patch, M.D.   Ct Abdomen Pelvis W Contrast  08/06/2011  *RADIOLOGY REPORT*  Clinical Data: Evaluate recurrent postop distention.  Epigastric pain.  Recent ventral laparoscopic hernia repair.  Surgery 07/29/2011.  CT ABDOMEN AND PELVIS WITH CONTRAST  Technique:  Multidetector CT imaging of the abdomen and pelvis was performed following the standard protocol during bolus administration of intravenous contrast.  Contrast: OMNIPAQUE IOHEXOL 300 MG/ML  SOLN  Comparison: Plain film of 08/06/2011.  CT of 06/08/2011.  Findings: Subsegmental atelectasis at the lung bases, greater left than right.  Mild cardiomegaly, without pericardial or pleural effusion.  Trace perihepatic ascites.  Mild hepatic steatosis.  Mild hepatomegaly, 19.6 cm cranial caudal.  Normal spleen, stomach, pancreas. Cholecystectomy without biliary ductal dilatation.  Normal adrenal glands.  Too small to characterize lesions within bilateral kidneys. No retroperitoneal or retrocrural adenopathy.  The colon is normal in caliber. Scattered colonic diverticula. Normal terminal ileum and appendix.  The duodenum and proximal jejunum are dilated, up to 3.9 cm.  The small bowel undergoes a relatively gradual transition to normal to decompressed mid and distal small bowel.  No obstructive mass identified.  No pneumatosis or other signs of small bowel ischemia. No free intraperitoneal air.  No ascites.  Trace right pelvic fluid image 76 is new.  Bilateral fat containing inguinal hernias. No pelvic adenopathy.  Normal urinary bladder. Hysterectomy.  No adnexal mass.  Left anterior pelvic wall laxity versus  a fat-containing spigelian hernia.  Image 71.  Postoperative edema within the subcutaneous fat of the anterior abdomen.  Minimal fluid within the subcutaneous fat on image 47 which is likely postoperative.  4.9 x 1.2 cm.  No surrounding enhancement. No acute osseous abnormality.    Disc bulges at L5 disc bulges at L4-L5 and L5-S1.  IMPRESSION:  1.  Proximal small bowel dilatation, with a relatively gradual transition to normal to decompressed mid to distal small bowel. Favor focal postoperative adynamic ileus.  A low grade partial small bowel obstruction is felt less likely.  No evidence of ischemia or other acute complication. 2.  Trace perihepatic and right-sided pelvic ascites, likely postoperative. 3.  Minimal fluid in the anterior abdominal wall, likely postoperative.  No significant surrounding enhancement or inflammation to suggest complicating infection.  Original Report Authenticated By: Consuello Bossier, M.D.   Dg Abd 2 Views  08/06/2011  *RADIOLOGY  REPORT*  Clinical Data: Postop hernia repair 1 week ago with pain in the upper abdomen  ABDOMEN - 2 VIEW  Comparison: Abdomen films of 08/04/2011  Findings: There is significant gaseous distention of the stomach with air-fluid level within the stomach. In addition however there is gaseous distention of loops of small bowel as well.  Although this could be due to postoperative ileus, a partial small bowel obstruction cannot be excluded.  No free air is seen on the erect view.  Surgical clips are present in the right upper quadrant from prior cholecystectomy.  IMPRESSION:   Significant gaseous distention of the stomach with gaseous distention of small bowel with air-fluid levels.  Possible ileus but cannot exclude partial small bowel obstruction.  Original Report Authenticated By: Juline Patch, M.D.   Medications: I have reviewed the patient's current medications. Scheduled Meds:   . amitriptyline  50 mg Oral QHS  . atenolol  25 mg Oral BID  . bisacodyl   10 mg Rectal Daily  . digoxin  0.25 mg Oral Daily  . diltiazem  180 mg Oral Daily  . enoxaparin (LOVENOX) injection  120 mg Subcutaneous Q24H  . FLUoxetine  20 mg Oral Daily  . gabapentin  100 mg Oral BID  . glipiZIDE  10 mg Oral BID AC  . insulin aspart  0-15 Units Subcutaneous Q4H  . iohexol  20 mL Oral Q1 Hr x 2  . ketorolac  1 drop Right Eye BID  . levothyroxine  75 mcg Oral QAC breakfast  . lidocaine  20 mL Mouth/Throat Q4H  . lisinopril  10 mg Oral Daily  . metFORMIN  1,000 mg Oral BID WC  . mulitivitamin with minerals  1 tablet Oral Daily  . pantoprazole  40 mg Oral Q1200  . potassium chloride  10 mEq Intravenous Q1 Hr x 4  . simvastatin  10 mg Oral q1800  . warfarin  7.5 mg Oral ONCE-1800  . warfarin  7.5 mg Oral ONCE-1800  . Warfarin - Pharmacist Dosing Inpatient   Does not apply q1800  . DISCONTD: insulin aspart  0-15 Units Subcutaneous TID WC & HS  . DISCONTD: metoCLOPramide  10 mg Oral TID AC & HS   Continuous Infusions:   . dextrose 5 % and 0.45 % NaCl with KCl 20 mEq/L 100 mL/hr at 08/06/11 0950  . TPN (CLINIMIX) +/- additives     PRN Meds:.albuterol, guaiFENesin, HYDROcodone-acetaminophen, HYDROmorphone (DILAUDID) injection, iohexol, magic mouthwash, ondansetron (ZOFRAN) IV, promethazine   Assessment/Plan:   Post Op ileus: Ventral hernia s/p laparoscopic lysis of adhesions and hernia repair with mesh 07/29/11: Patient had low clinical response with postop ileus.  NGT was taken out yesterday, but patient had significant nausea today with eating very little and NGT replaced.  Per patient lots of NGT drainage when it was replaced.  Leukocytosis increased for past three days but stable today.  This is being managed by the patient's primary team.    -Have changed as many medications as possible to IV since she again has NGT and is NPO and reports trouble swallowing meds.  Please see below itemized problem list assessments and plan to see specifics.  -In addition to  below changes, hold gabapentin and amitriptyline for now since on narcotics for pain.  Restart these on discharge.  Leukocytosis: WBC 9.9 three days ago -> 12.6 two days ago -> 17.7 one day ago -> 16.8 today.  Differential yesterday showed ANC 14.1.  Afebrile.    Most likely causes  are persistent ileus / postoperative state vs possible UTI.  Urinalysis showed no nitrites, 3-6 WBC, rare bacteria, no hemoglobin.  No dysuria, hematuria, or polyuria per patient.  Urine culture pending. Chest XRay today showed just persistent linear atelectasis at L lung base.  No evidence of PNA.  No evidence of DVT on exam and has been on anticoagulation here making DVT unlikely.  Laparoscope incisions show no evidence of infection.  -Since she has diabetes, any UTI would qualify as complicated in this patient.  Will start ciprofloxacin 400mg  IV BID for presumed UTI.  Will follow-up urine culture and discontinue ciprofloxacin if urine culture negative.  If she indeed has UTI, she needs 7-14 days of treatment.  Once tolerating PO, convert to Ciprofloxacin 500mg  PO BID if still needed.    Afib: stable, rates ~100 bpm at goal on atenolol 25mg  PO BID, Diltiazem XR 180mg  PO daily, Digoxin 0.25 mg daily.    Will treat with full dose lovenox in place of warfarin since she is not easily taking PO.  Restart warfarin once tolerating PO.  -Hold warfarin, make Lovenox 130mg  daily (full dose Lovenox dose per pharmacist) -Digoxin 0.125mg  IV daily instead of 0.25mg  PO daily while NPO with NGT -For rate control, start metoprolol 5mg  IV Q6H and hold PO atenolol and PO Diltiazem XR.  Switch back to the PO meds once tolerating POs.    DM2: Well controlled. A1c- 6.3. CBGs look good in recent days.   - Continue Lantus at 10 units and SSI>>>CBG good control  - Hold oral medications while NGT in place due to swallowing difficulty.  Restart oral meds once tolerating PO  Hypothyroidism: TSH- 0.756. Normal.  -Will convert synthroid to IV  while NGT in place. -Restart home PO synthroid dose when tolerating PO  Hypertension: BP up an down but control ok overall.  -Hold PO meds (Lisinopril, Cardizem, and Atenolol). -Start metoprolol 5mg  IV Q6H in place of these medications for rate control and blood pressure -can resume home regimen once tolerating POs  Hx of TIA/CVA: Unclear when pt had it. She refuses any Hx though.  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 This was discussed by Dr. Dierdre Searles with Dr. Pearlean Brownie with Neurology.   -Lovenox full dose in place of warfarin while NPO.  Restart warfarin when she again gets back to POs.    Depression: Continue PO Prozac if she can swallow it to avoid SSRI withdrawal  GERD: Will convert Protonix to IV while NPO with NGT.  DVT Prophylaxis: Lovenox     LOS: 8 days   Yaakov Guthrie, BRAD 08/06/2011, 3:51 PM

## 2011-08-06 NOTE — Progress Notes (Signed)
Peripherally Inserted Central Catheter/Midline Placement  The IV Nurse has discussed with the patient and/or persons authorized to consent for the patient, the purpose of this procedure and the potential benefits and risks involved with this procedure.  The benefits include less needle sticks, lab draws from the catheter and patient may be discharged home with the catheter.  Risks include, but not limited to, infection, bleeding, blood clot (thrombus formation), and puncture of an artery; nerve damage and irregular heat beat.  Alternatives to this procedure were also discussed.  PICC/Midline Placement Documentation        [name] 08/06/2011, 5:41 PM

## 2011-08-06 NOTE — Progress Notes (Signed)
8 Days Post-Op  Subjective: She is passing lots of flatus and has had 3 formed stools in the last 24 hours. Despite this she says she doesn't feel quite as good. He is coughing some and feels a little nauseated. Taking some PO. She is ambulating. She is working with physical therapy and occupational therapy. Home health PT and home health OT Have been requested.  Lab work yesterday shows WBC 17,000, hemoglobin 14.1. Blood glucose 165 CBGs range from 118-176..  Objective: Vital signs in last 24 hours: Temp:  [98.2 F (36.8 C)-99.4 F (37.4 C)] 98.2 F (36.8 C) (05/29 2150) Pulse Rate:  [100-109] 102  (05/29 2150) Resp:  [18] 18  (05/29 2150) BP: (100-134)/(72-76) 110/74 mmHg (05/29 2150) SpO2:  [94 %-96 %] 96 % (05/29 2150) Last BM Date: 08/03/11  Intake/Output from previous day: 05/29 0701 - 05/30 0700 In: 1360 [P.O.:560; I.V.:800] Out: -  Intake/Output this shift: Total I/O In: 120 [P.O.:120] Out: -   General appearance: alert. No distress. Slightly deconditioned. Mental status normal. Skin warm and dry. Resp: diminished breath sounds at left base. No wheezing. GI: obese, soft. Bowel sounds present. Perhaps slightly distended. Wounds looked fine. No localized  unusual tenderness.  Lab Results:  Results for orders placed during the hospital encounter of 07/29/11 (from the past 24 hour(s))  PROTIME-INR     Status: Normal   Collection Time   08/05/11  6:26 AM      Component Value Range   Prothrombin Time 14.7  11.6 - 15.2 (seconds)   INR 1.13  0.00 - 1.49   CBC     Status: Abnormal   Collection Time   08/05/11  6:26 AM      Component Value Range   WBC 17.7 (*) 4.0 - 10.5 (K/uL)   RBC 4.35  3.87 - 5.11 (MIL/uL)   Hemoglobin 14.1  12.0 - 15.0 (g/dL)   HCT 14.7  82.9 - 56.2 (%)   MCV 92.4  78.0 - 100.0 (fL)   MCH 32.4  26.0 - 34.0 (pg)   MCHC 35.1  30.0 - 36.0 (g/dL)   RDW 13.0  86.5 - 78.4 (%)   Platelets 287  150 - 400 (K/uL)  DIFFERENTIAL     Status: Abnormal   Collection Time   08/05/11  6:26 AM      Component Value Range   Neutrophils Relative 80 (*) 43 - 77 (%)   Neutro Abs 14.1 (*) 1.7 - 7.7 (K/uL)   Lymphocytes Relative 11 (*) 12 - 46 (%)   Lymphs Abs 1.9  0.7 - 4.0 (K/uL)   Monocytes Relative 9  3 - 12 (%)   Monocytes Absolute 1.5 (*) 0.1 - 1.0 (K/uL)   Eosinophils Relative 1  0 - 5 (%)   Eosinophils Absolute 0.2  0.0 - 0.7 (K/uL)   Basophils Relative 0  0 - 1 (%)   Basophils Absolute 0.0  0.0 - 0.1 (K/uL)  COMPREHENSIVE METABOLIC PANEL     Status: Abnormal   Collection Time   08/05/11  6:26 AM      Component Value Range   Sodium 136  135 - 145 (mEq/L)   Potassium 3.7  3.5 - 5.1 (mEq/L)   Chloride 98  96 - 112 (mEq/L)   CO2 21  19 - 32 (mEq/L)   Glucose, Bld 165 (*) 70 - 99 (mg/dL)   BUN 7  6 - 23 (mg/dL)   Creatinine, Ser 6.96  0.50 - 1.10 (mg/dL)  Calcium 10.1  8.4 - 10.5 (mg/dL)   Total Protein 7.1  6.0 - 8.3 (g/dL)   Albumin 3.0 (*) 3.5 - 5.2 (g/dL)   AST 15  0 - 37 (U/L)   ALT 15  0 - 35 (U/L)   Alkaline Phosphatase 90  39 - 117 (U/L)   Total Bilirubin 1.0  0.3 - 1.2 (mg/dL)   GFR calc non Af Amer >90  >90 (mL/min)   GFR calc Af Amer >90  >90 (mL/min)  GLUCOSE, CAPILLARY     Status: Abnormal   Collection Time   08/05/11  8:16 AM      Component Value Range   Glucose-Capillary 170 (*) 70 - 99 (mg/dL)   Comment 1 Notify RN    GLUCOSE, CAPILLARY     Status: Abnormal   Collection Time   08/05/11 12:29 PM      Component Value Range   Glucose-Capillary 176 (*) 70 - 99 (mg/dL)   Comment 1 Notify RN    GLUCOSE, CAPILLARY     Status: Abnormal   Collection Time   08/05/11  4:10 PM      Component Value Range   Glucose-Capillary 118 (*) 70 - 99 (mg/dL)   Comment 1 Documented in Chart     Comment 2 Notify RN    GLUCOSE, CAPILLARY     Status: Abnormal   Collection Time   08/05/11  8:11 PM      Component Value Range   Glucose-Capillary 162 (*) 70 - 99 (mg/dL)   Comment 1 Documented in Chart     Comment 2 Notify RN      GLUCOSE, CAPILLARY     Status: Abnormal   Collection Time   08/06/11 12:12 AM      Component Value Range   Glucose-Capillary 127 (*) 70 - 99 (mg/dL)     Studies/Results: @RISRSLT24 @     . amitriptyline  50 mg Oral QHS  . atenolol  25 mg Oral BID  . bisacodyl  10 mg Rectal Daily  . digoxin  0.25 mg Oral Daily  . diltiazem  180 mg Oral Daily  . enoxaparin (LOVENOX) injection  120 mg Subcutaneous Q24H  . FLUoxetine  20 mg Oral Daily  . gabapentin  100 mg Oral BID  . glipiZIDE  10 mg Oral BID AC  . insulin aspart  0-15 Units Subcutaneous TID WC & HS  . ketorolac  1 drop Right Eye BID  . levothyroxine  75 mcg Oral QAC breakfast  . lidocaine  20 mL Mouth/Throat Q4H  . lisinopril  10 mg Oral Daily  . metFORMIN  1,000 mg Oral BID WC  . pantoprazole  40 mg Oral Q1200  . simvastatin  10 mg Oral q1800  . warfarin  7.5 mg Oral ONCE-1800  . Warfarin - Pharmacist Dosing Inpatient   Does not apply q1800  . DISCONTD: antiseptic oral rinse  15 mL Mouth Rinse q12n4p  . DISCONTD: chlorhexidine  15 mL Mouth Rinse BID  . DISCONTD: digoxin  0.125 mg Intravenous Daily  . DISCONTD: insulin aspart  0-15 Units Subcutaneous Q4H  . DISCONTD: insulin glargine  10 Units Subcutaneous QHS  . DISCONTD: metoCLOPramide (REGLAN) injection  10 mg Intravenous Q6H  . DISCONTD: metoCLOPramide  10 mg Oral TID AC & HS     Assessment/Plan: s/p Procedure(s): LAPAROSCOPIC VENTRAL HERNIA INSERTION OF MESH   POD #7. Stable. GI function has not normalized, still with nausea. Discontinue Reglan Check belly films today Leave  diet  full liquids.  Leukocytosis, etiology unclear. Will check chest x-ray today to rule out left lower lobe pneumonia. We'll also check urinalysis and urine culture.  Atrial fibrillation. Stable. On full dose Lovenox and starting Coumadin per pharmacy.  Type 2 diabetes. Reasonably well-controlled. On sliding scale insulin and metformin and Glucotrol.  Hypothyroidism. TSH normal.  Continue Synthroid  Hypertension. Well-controlled  Chronic anxiety and depression. Currently not much for her problem  Somewhat deconditioned. Will need home health PT and home health IT.    LOS: 8 days    Celita Aron M. Derrell Lolling, M.D., [facility] General and Minimally invasive Surgery Breast and Colorectal Surgery Office:   [phone] Pager:   [phone]  08/06/2011  . .prob

## 2011-08-06 NOTE — Progress Notes (Signed)
PT Cancellation Note  Treatment cancelled today due to medical issues with patient which prohibited therapy. Spoke with patient earlier this morning and she had just returned to bed after ambulating requesting to hold. Pt now feeling nauseous. Will hold and f/u later today as time allows.   [facility] [name] 08/06/2011, 10:42 AM Pager: [phone]

## 2011-08-07 ENCOUNTER — Encounter (HOSPITAL_COMMUNITY): Payer: Self-pay | Admitting: Physician Assistant

## 2011-08-07 DIAGNOSIS — R933 Abnormal findings on diagnostic imaging of other parts of digestive tract: Secondary | ICD-10-CM

## 2011-08-07 DIAGNOSIS — E876 Hypokalemia: Secondary | ICD-10-CM

## 2011-08-07 DIAGNOSIS — K56609 Unspecified intestinal obstruction, unspecified as to partial versus complete obstruction: Secondary | ICD-10-CM

## 2011-08-07 DIAGNOSIS — R1084 Generalized abdominal pain: Secondary | ICD-10-CM

## 2011-08-07 DIAGNOSIS — K56600 Partial intestinal obstruction, unspecified as to cause: Secondary | ICD-10-CM

## 2011-08-07 HISTORY — DX: Abnormal findings on diagnostic imaging of other parts of digestive tract: R93.3

## 2011-08-07 LAB — CBC
Hemoglobin: 12.1 g/dL (ref 12.0–15.0)
MCH: 31.9 pg (ref 26.0–34.0)
MCHC: 34.1 g/dL (ref 30.0–36.0)
MCV: 93.7 fL (ref 78.0–100.0)
Platelets: 297 10*3/uL (ref 150–400)
RBC: 3.79 MIL/uL — ABNORMAL LOW (ref 3.87–5.11)
RDW: 12.7 % (ref 11.5–15.5)

## 2011-08-07 LAB — TRIGLYCERIDES: Triglycerides: 173 mg/dL — ABNORMAL HIGH (ref <150)

## 2011-08-07 LAB — COMPREHENSIVE METABOLIC PANEL
ALT: 36 U/L — ABNORMAL HIGH (ref 0–35)
AST: 19 U/L (ref 0–37)
Alkaline Phosphatase: 92 U/L (ref 39–117)
BUN: 7 mg/dL (ref 6–23)
CO2: 27 mEq/L (ref 19–32)
Calcium: 9.5 mg/dL (ref 8.4–10.5)
Chloride: 99 mEq/L (ref 96–112)
Creatinine, Ser: 0.48 mg/dL — ABNORMAL LOW (ref 0.50–1.10)
GFR calc Af Amer: >90 mL/min (ref >90)
GFR calc non Af Amer: >90 mL/min (ref >90)
Sodium: 134 mEq/L — ABNORMAL LOW (ref 135–145)
Total Protein: 6 g/dL (ref 6.0–8.3)

## 2011-08-07 LAB — DIFFERENTIAL
Basophils Absolute: 0 10*3/uL (ref 0.0–0.1)
Basophils Relative: 0 % (ref 0–1)
Eosinophils Absolute: 0.4 10*3/uL (ref 0.0–0.7)
Eosinophils Relative: 4 % (ref 0–5)
Monocytes Absolute: 0.8 10*3/uL (ref 0.1–1.0)
Monocytes Relative: 9 % (ref 3–12)
Neutro Abs: 6 10*3/uL (ref 1.7–7.7)
Neutrophils Relative %: 63 % (ref 43–77)

## 2011-08-07 LAB — MAGNESIUM: Magnesium: 1.5 mg/dL (ref 1.5–2.5)

## 2011-08-07 LAB — PROTIME-INR: Prothrombin Time: 17.9 seconds — ABNORMAL HIGH (ref 11.6–15.2)

## 2011-08-07 LAB — GLUCOSE, CAPILLARY
Glucose-Capillary: 132 mg/dL — ABNORMAL HIGH (ref 70–99)
Glucose-Capillary: 153 mg/dL — ABNORMAL HIGH (ref 70–99)
Glucose-Capillary: 168 mg/dL — ABNORMAL HIGH (ref 70–99)
Glucose-Capillary: 190 mg/dL — ABNORMAL HIGH (ref 70–99)

## 2011-08-07 LAB — CHOLESTEROL, TOTAL: Cholesterol: 108 mg/dL (ref 0–200)

## 2011-08-07 LAB — URINE CULTURE

## 2011-08-07 LAB — PHOSPHORUS: Phosphorus: 3 mg/dL (ref 2.3–4.6)

## 2011-08-07 MED ORDER — KETOROLAC TROMETHAMINE 15 MG/ML IJ SOLN: 15.0000 mg | Freq: Four times a day (QID) | INTRAMUSCULAR | Status: AC | PRN

## 2011-08-07 MED ORDER — POTASSIUM CHLORIDE 10 MEQ/100ML IV SOLN
INTRAVENOUS | Status: AC
  Filled 2011-08-07: qty 100

## 2011-08-07 MED ORDER — POTASSIUM CHLORIDE 10 MEQ/100ML IV SOLN
INTRAVENOUS | Status: AC
  Administered 2011-08-07: 10 meq
  Filled 2011-08-07: qty 100

## 2011-08-07 MED ORDER — FAT EMULSION 20 % IV EMUL
250.0000 mL | INTRAVENOUS | Status: AC
  Administered 2011-08-07: 250 mL via INTRAVENOUS
  Filled 2011-08-07: qty 250

## 2011-08-07 MED ORDER — POTASSIUM CHLORIDE 10 MEQ/100ML IV SOLN
10.0000 meq | INTRAVENOUS | Status: AC
  Administered 2011-08-06 – 2011-08-07 (×3): 10 meq via INTRAVENOUS
  Filled 2011-08-07: qty 100

## 2011-08-07 MED ORDER — POTASSIUM CHLORIDE 10 MEQ/50ML IV SOLN
10.0000 meq | INTRAVENOUS | Status: AC
  Administered 2011-08-07 (×3): 10 meq via INTRAVENOUS
  Filled 2011-08-07 (×5): qty 50

## 2011-08-07 MED ORDER — TRACE MINERALS CR-CU-MN-SE-ZN 10-1000-500-60 MCG/ML IV SOLN
INTRAVENOUS | Status: AC
  Filled 2011-08-07: qty 2000

## 2011-08-07 MED ORDER — METOCLOPRAMIDE HCL 5 MG/ML IJ SOLN
10.0000 mg | Freq: Four times a day (QID) | INTRAMUSCULAR | Status: AC
  Administered 2011-08-07 – 2011-08-10 (×11): 10 mg via INTRAVENOUS
  Filled 2011-08-07 (×12): qty 2

## 2011-08-07 MED ORDER — MAGNESIUM SULFATE IN D5W 10-5 MG/ML-% IV SOLN
1.0000 g | Freq: Once | INTRAVENOUS | Status: AC
  Administered 2011-08-07: 1 g via INTRAVENOUS
  Filled 2011-08-07: qty 100

## 2011-08-07 NOTE — Progress Notes (Signed)
Physical Therapy Treatment Patient Details Name: [name] MRN: [id number] DOB: Jul 31, 1950 Today's Date: 08/07/2011 Time: 7829-5621 PT Time Calculation (min): 30 min  PT Assessment / Plan / Recommendation Comments on Treatment Session  Great progress with ambulation, modified independent with RW, possibly try to progress to cane next visit.     Follow Up Recommendations  Home health PT;Supervision for mobility/OOB    Barriers to Discharge        Equipment Recommendations  Rolling walker with 5" wheels;3 in 1 bedside comode    Recommendations for Other Services    Frequency Min 3X/week   Plan Discharge plan remains appropriate;Frequency remains appropriate    Precautions / Restrictions Precautions Precautions: Fall Restrictions Weight Bearing Restrictions: No       Mobility  Bed Mobility Bed Mobility: Sit to Supine Sit to Supine: 6: Modified independent (Device/Increase time);HOB elevated (40 degrees) Transfers Sit to Stand: 5: Supervision;With upper extremity assist;From chair/3-in-1;From toilet;4: Min guard Stand to Sit: 5: Supervision;With upper extremity assist;To bed;To toilet Details for Transfer Assistance: good technique, cues for safe technique with RW but towards end of session pt not needing RW as much so close gaurding when standing for stability without RW Ambulation/Gait Ambulation/Gait Assistance: 4: Min assist;5: Supervision;6: Modified independent (Device/Increase time) Ambulation Distance (Feet): 250 Feet Assistive device: Rolling walker;None Ambulation/Gait Assistance Details: pt supervision-modified independent with RW, ambulated approx 250 ft with RW; following ambulation in the hallway using the restroom and ambulating into and out of bathroom with no AD but needing minA (HHA on right) for stability ambulating more gaurded with no AD, wider BOS and decreased step length; possibly try cane next visit? Gait Pattern: Within Functional Limits Stairs:  Yes Stairs Assistance: 4: Min assist Stairs Assistance Details (indicate cue type and reason): cues for safe technique and speed Stair Management Technique: One rail Right;Forwards (hand held assist on other side) Number of Stairs: 2  (1 step x2)    Exercises General Exercises - Lower Extremity Long Arc Quad: AROM;Both;20 reps;Seated     PT Goals Acute Rehab PT Goals Pt will go Sit to Supine/Side: Independently;with HOB 0 degrees PT Goal: Sit to Supine/Side - Progress: Updated due to goal met Pt will go Sit to Stand: with modified independence PT Goal: Sit to Stand - Progress: Progressing toward goal Pt will go Stand to Sit: with modified independence PT Goal: Stand to Sit - Progress: Progressing toward goal Pt will Transfer Bed to Chair/Chair to Bed: with modified independence PT Transfer Goal: Bed to Chair/Chair to Bed - Progress: Progressing toward goal Pt will Ambulate: >150 feet;with modified independence;with least restrictive assistive device PT Goal: Ambulate - Progress: Progressing toward goal Pt will Go Up / Down Stairs: 3-5 stairs;with min assist;with least restrictive assistive device PT Goal: Up/Down Stairs - Progress: Progressing toward goal Pt will Perform Home Exercise Program: Independently PT Goal: Perform Home Exercise Program - Progress: Progressing toward goal  Visit Information  Last PT Received On: 08/07/11 Assistance Needed: +1    Subjective Data  Subjective: I feel so much better.    Cognition  Overall Cognitive Status: Appears within functional limits for tasks assessed/performed Arousal/Alertness: Awake/alert Orientation Level: Appears intact for tasks assessed Behavior During Session: [facility] for tasks performed    Balance     End of Session PT - End of Session Equipment Utilized During Treatment: Gait belt Activity Tolerance: Patient tolerated treatment well Patient left: in bed;with call bell/phone within reach Nurse Communication: Mobility  status    [facility] [employer] [facility]  [name] 08/07/2011, 1:08 PM

## 2011-08-07 NOTE — Progress Notes (Addendum)
Subjective: Patient states that she feels ok. No nausea, vomiting abdominal pain since the NG was placed yesterday. Patient endorses passing flatulence last night.  she states that she has been ambulating daily with staff/PT. No acute issue last night.  Objective: Vital signs in last 24 hours: Filed Vitals:   08/05/11 2150 08/06/11 0622 08/06/11 2202 08/07/11 0532  BP: 110/74 164/82 135/74 121/68  Pulse: 102 96 103 102  Temp: 98.2 F (36.8 C) 98 F (36.7 C) 98.2 F (36.8 C) 97.2 F (36.2 C)  TempSrc:  Oral Oral   Resp: 18 18 18 18   Height:      Weight:      SpO2: 96% 96% 96% 96%   Weight change:   Intake/Output Summary (Last 24 hours) at 08/07/11 0958 Last data filed at 08/07/11 0520  Gross per 24 hour  Intake 3229.33 ml  Output   3550 ml  Net -320.67 ml   General: D. NGT in place   Cardiac: irregularly irregular rhythm, no M/G/R Pulm: CTA B/L Abd: Soft, active BS x 4. Mild tenderness to palpation, mildly distended. Multiple laparoscopic incisions healing well.  Ext: warm and well perfused, no pedal edema. No tenderness or swelling. No erythema.  Neuro: alert and oriented X3, cranial nerves II-XII grossly intact  Lab Results: Basic Metabolic Panel:  Lab 08/07/11 7846 08/06/11 0630   134* 134*  K 3.4* 3.8  CL 99 96  CO2 27 22  GLUCOSE 172* 150*  BUN 7 7  CREATININE 0.48* 0.36*  CALCIUM 9.5 9.5  MG 1.5 --  PHOS 3.0 --   Liver Function Tests:  Lab 08/07/11 0520 08/05/11 0626  AST 19 15  ALT 36* 15  ALKPHOS 92 90  BILITOT 0.4 1.0  PROT 6.0 7.1  ALBUMIN 2.5* 3.0*   CBC:  Lab 08/07/11 0520 08/06/11 0630 08/05/11 0626  WBC 9.4 16.8* --  NEUTROABS 6.0 -- 14.1*  HGB 12.1 13.2 --  HCT 35.5* 37.7 --  MCV 93.7 92.0 --  PLT 297 351 --   CBG:  Lab 08/07/11 0741 08/07/11 0356 08/06/11 2353 08/06/11 1946 08/06/11 1816 08/06/11 1402  GLUCAP 118* 153* 153* 104* 73 129*   Fasting Lipid Panel:  Lab 08/07/11 0520  CHOL 108  HDL --  LDLCALC --  TRIG  962*  CHOLHDL --  LDLDIRECT --   Thyroid Function Tests:  Lab 07/31/11 1016  TSH 0.756  T4TOTAL --  FREET4 --  T3FREE --  THYROIDAB --   Coagulation:  Lab 08/07/11 0520 08/06/11 0630 08/05/11 0626 08/04/11 0520  LABPROT 17.9* 17.0* 14.7 13.6  INR 1.45 1.36 1.13 1.02   Urinalysis:  Lab 08/06/11 0900  COLORURINE AMBER*  LABSPEC 1.031*  PHURINE 5.5  GLUCOSEU NEGATIVE  HGBUR NEGATIVE  BILIRUBINUR SMALL*  KETONESUR NEGATIVE  PROTEINUR 30*  UROBILINOGEN 1.0  NITRITE NEGATIVE  LEUKOCYTESUR SMALL*  Micro Results: Recent Results (from the past 240 hour(s))  URINE CULTURE     Status: Normal   Collection Time   08/06/11  9:00 AM      Component Value Range Status Comment   Specimen Description URINE, CLEAN CATCH   Final    Special Requests NONE   Final    Culture  Setup Time [id number]   Final    Colony Count 55,000 COLONIES/ML   Final    Culture     Final    Value: Multiple bacterial morphotypes present, none predominant. Suggest appropriate recollection if clinically indicated.   Report Status 08/07/2011 FIL  Final    Studies/Results: Dg Chest 2 View  08/06/2011  *RADIOLOGY REPORT*  Clinical Data: Postop hernia repair 1 week ago, upper abdominal pain  CHEST - 2 VIEW  Comparison: Portable chest x-ray of 07/31/2011  Findings: Linear atelectasis remains at the left lung base. Otherwise the lungs appear clear.  There is some elevation of left hemidiaphragm which appears to be due to significant gaseous distention of the stomach.  Is there any suspicion of gastric outlet abnormality?  The heart is within normal limits in size.  IMPRESSION:  1.  Persistent linear atelectasis at the left lung base. 2.  Gaseous distention of the stomach with elevation of the left hemidiaphragm.  Consider gastric outlet obstruction.  Original Report Authenticated By: Juline Patch, M.D.   Ct Abdomen Pelvis W Contrast  08/06/2011  *RADIOLOGY REPORT*  Clinical Data: Evaluate recurrent postop  distention.  Epigastric pain.  Recent ventral laparoscopic hernia repair.  Surgery 07/29/2011.  CT ABDOMEN AND PELVIS WITH CONTRAST  Technique:  Multidetector CT imaging of the abdomen and pelvis was performed following the standard protocol during bolus administration of intravenous contrast.  Contrast: OMNIPAQUE IOHEXOL 300 MG/ML  SOLN  Comparison: Plain film of 08/06/2011.  CT of 06/08/2011.  Findings: Subsegmental atelectasis at the lung bases, greater left than right.  Mild cardiomegaly, without pericardial or pleural effusion.  Trace perihepatic ascites.  Mild hepatic steatosis.  Mild hepatomegaly, 19.6 cm cranial caudal.  Normal spleen, stomach, pancreas. Cholecystectomy without biliary ductal dilatation.  Normal adrenal glands.  Too small to characterize lesions within bilateral kidneys. No retroperitoneal or retrocrural adenopathy.  The colon is normal in caliber. Scattered colonic diverticula. Normal terminal ileum and appendix.  The duodenum and proximal jejunum are dilated, up to 3.9 cm.  The small bowel undergoes a relatively gradual transition to normal to decompressed mid and distal small bowel.  No obstructive mass identified.  No pneumatosis or other signs of small bowel ischemia. No free intraperitoneal air.  No ascites.  Trace right pelvic fluid image 76 is new.  Bilateral fat containing inguinal hernias. No pelvic adenopathy.  Normal urinary bladder. Hysterectomy.  No adnexal mass.  Left anterior pelvic wall laxity versus a fat-containing spigelian hernia.  Image 71.  Postoperative edema within the subcutaneous fat of the anterior abdomen.  Minimal fluid within the subcutaneous fat on image 47 which is likely postoperative.  4.9 x 1.2 cm.  No surrounding enhancement. No acute osseous abnormality.    Disc bulges at L5 disc bulges at L4-L5 and L5-S1.  IMPRESSION:  1.  Proximal small bowel dilatation, with a relatively gradual transition to normal to decompressed mid to distal small bowel.  Favor focal postoperative adynamic ileus.  A low grade partial small bowel obstruction is felt less likely.  No evidence of ischemia or other acute complication. 2.  Trace perihepatic and right-sided pelvic ascites, likely postoperative. 3.  Minimal fluid in the anterior abdominal wall, likely postoperative.  No significant surrounding enhancement or inflammation to suggest complicating infection.  Original Report Authenticated By: Consuello Bossier, M.D.   Dg Chest Port 1 View  08/06/2011  *RADIOLOGY REPORT*  Clinical Data: PICC placement.  PORTABLE CHEST - 1 VIEW  Comparison: PA and lateral chest 08/06/2011 at 7:47 a.m.  Findings: Right PICC has its tip at the superior cavoatrial junction.  The catheter could be withdrawn 2-2.5 cm for better positioning.  NG tube has in good position with the side port in the stomach.  Lungs are clear.  Heart size normal.  No pneumothorax or pleural fluid.  IMPRESSION:  1.  Tip of right PICC is just within the right atrium.  The catheter could be withdrawn 2-2.5 cm for better positioning. 2.  NG tube in good position. 3.  Resolved left mid lung atelectasis.  Original Report Authenticated By: Bernadene Bell. D'ALESSIO, M.D.   Dg Abd 2 Views  08/06/2011  *RADIOLOGY REPORT*  Clinical Data: Postop hernia repair 1 week ago with pain in the upper abdomen  ABDOMEN - 2 VIEW  Comparison: Abdomen films of 08/04/2011  Findings: There is significant gaseous distention of the stomach with air-fluid level within the stomach. In addition however there is gaseous distention of loops of small bowel as well.  Although this could be due to postoperative ileus, a partial small bowel obstruction cannot be excluded.  No free air is seen on the erect view.  Surgical clips are present in the right upper quadrant from prior cholecystectomy.  IMPRESSION:   Significant gaseous distention of the stomach with gaseous distention of small bowel with air-fluid levels.  Possible ileus but cannot exclude partial  small bowel obstruction.  Original Report Authenticated By: Juline Patch, M.D.   Medications: I have reviewed the patient's current medications. Scheduled Meds:   . bisacodyl  10 mg Rectal Daily  . ciprofloxacin  400 mg Intravenous Q12H  . digoxin  0.125 mg Intravenous Daily  . enoxaparin (LOVENOX) injection  1.5 mg/kg Subcutaneous Q24H  . FLUoxetine  20 mg Oral Daily  . insulin aspart  0-15 Units Subcutaneous Q4H  . iohexol  20 mL Oral Q1 Hr x 2  . ketorolac  1 drop Right Eye BID  . levothyroxine  38 mcg Intravenous QAC breakfast  . lidocaine  20 mL Mouth/Throat Q4H  . magnesium sulfate 1 - 4 g bolus IVPB  1 g Intravenous Once  . metoCLOPramide (REGLAN) injection  10 mg Intravenous Q6H  . metoprolol  5 mg Intravenous Q6H  . pantoprazole (PROTONIX) IV  40 mg Intravenous Q24H  . potassium chloride  10 mEq Intravenous Q1 Hr x 4  . potassium chloride  10 mEq Intravenous Q1 Hr x 3  . potassium chloride  10 mEq Intravenous Q1 Hr x 5  . potassium chloride      . potassium chloride      . simvastatin  10 mg Oral q1800  . DISCONTD: amitriptyline  50 mg Oral QHS  . DISCONTD: atenolol  25 mg Oral BID  . DISCONTD: digoxin  0.25 mg Oral Daily  . DISCONTD: diltiazem  180 mg Oral Daily  . DISCONTD: enoxaparin (LOVENOX) injection  120 mg Subcutaneous Q24H  . DISCONTD: enoxaparin (LOVENOX) injection  40 mg Subcutaneous Q24H  . DISCONTD: gabapentin  100 mg Oral BID  . DISCONTD: glipiZIDE  10 mg Oral BID AC  . DISCONTD: insulin aspart  0-15 Units Subcutaneous TID WC & HS  . DISCONTD: levothyroxine  75 mcg Oral QAC breakfast  . DISCONTD: lisinopril  10 mg Oral Daily  . DISCONTD: metFORMIN  1,000 mg Oral BID WC  . DISCONTD: mulitivitamin with minerals  1 tablet Oral Daily  . DISCONTD: pantoprazole  40 mg Oral Q1200  . DISCONTD: pantoprazole (PROTONIX) IV  40 mg Intravenous Q24H  . DISCONTD: warfarin  7.5 mg Oral ONCE-1800  . DISCONTD: Warfarin - Pharmacist Dosing Inpatient   Does not apply  q1800   Continuous Infusions:   . dextrose 5 % and 0.45 % NaCl with KCl 20 mEq/L  100 mL/hr at 08/06/11 2050  . TPN (CNIMIX) +/- additives 40 mL/hr at 08/06/11 2026   PRN Meds:.albuterol, iohexol, ketorolac, magic mouthwash, ondansetron (ZOFRAN) IV, promethazine, sodium chloride, DISCONTD: guaiFENesin, DISCONTD: HYDROcodone-acetaminophen, DISCONTD:  HYDROmorphone (DILAUDID) injection Assessment/Plan:  # Post Op ileus: Ventral hernia s/p laparoscopic lysis of adhesions and hernia repair with mesh 07/29/11: Patient had low clinical response with postop ileus. NGT was removed and reinserted due to significant N/V and over 1000 ml drainage was noted. Abd CT showed focal post op ileus.  -continue NPO and NG per Surgical service - All home regimen switched to IV is possible - hold gabapentin and amitriptyline for now since on narcotics for pain. Restart these on discharge.   # Hypomagnesemia and hypokalemia - replaced by CCS - will follow his BMP and Mg   # Leukocytosis: WBC 9.9 three days ago -> 12.6 two days ago -> 17.7 one day ago -> 9.4 today. Patient Afebrile.  Most likely causes are persistent ileus / postoperative state vs possible UTI.  No evidence of P (CXR), DVT (No S/S on exam and on full dose Lovenox) and Incision (No S/S).  -UA  Mild pyuria, rare bacteria, negative nitrates -Urine Cx pending - ciprofloxacin 400mg  IV BID for presumed UTI>>awaiting UCx  Afib: stable, rates ~100 bpm at goal on atenolol 25mg  PO BID, Diltiazem XR 180mg  PO daily, Digoxin 0.25 mg daily.   -  full dose lovenox - Restart warfarin once tolerating PO.  - Digoxin 0.125mg  IV daily while NPO with NGT  - metoprolol 5mg  IV Q6H and hold PO atenolol and PO Diltiazem XR   DM2: Well controlled. A1c- 6.3. CBGs look good in recent days.  - Continue Lantus at 10 units and SSI>>>CBG good control  - Hold oral medications while NGT  Hypothyroidism: TSH- 0.756. Normal.  -Will convert synthroid to IV while NGT  in place.  -Restart home PO synthroid dose when tolerating PO   Hypertension: BP up an down but control ok overall.  -Hold PO meds (Lisinopril, Cardizem, and Atenolol).  -Start metoprolol 5mg  IV Q6H in place of these medications for rate control and blood pressure  -can resume home regimen once tolerating POs   Hx of TIA/CVA: Unclear when pt had it. She refuses any Hx though.  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 This was discussed by Dr. Dierdre Searles with Dr. Pearlean Brownie with Neurology.  -Lovenox full dose in place of warfarin while NPO. Restart warfarin when she again gets back to POs.   Depression: Continue PO Prozac if she can swallow it to avoid SSRI withdrawal   GERD: Will convert Protonix to IV while NPO with NGT.   DVT Prophylaxis: Lovenox      LOS: 9 days   ,  08/07/2011, 9:58 AM

## 2011-08-07 NOTE — Consult Note (Signed)
[facility] Consult: 11:30 AM 08/07/2011   Referring Provider: Edythe Lynn Primary Care Physician:  Melida Quitter, MD [facility]. Primary Gastroenterologist:  Dr. Sheryn Bison  Reason for Consultation:  GOO, vomiting after surgery.  HPI: [name] is a 61 y.o. female.    07/29/11 laparoscopic lysis of adhesions, requiring 60 minutes.  Repair multiple incarcerated ventral hernias with PhysioMesh (25 X 30 cm.)  Post op course marked by inability to advance diet, abdominal pain. Has required reinsertion of NG tube and initiation TNA.  NGT put out 300 ml when reinserted yesterday.  This has helped resolve nausea and abd pain. She is passing flatus. S/P antireflux surgery, hiatal herniorrhaphy ~ 1990.  Has also had multipleabdominal/pelvic surgeries in past.   Post surgical imaging includes: *  CT scan showing dilatation in prox SB with gradual transition to decompressed mid distal SB, favor adynamic ileus, trace ascites, minor ant abd wall fluid. *  2 view abd: Significant gaseous distention of the stomach and small bowel with air-fluid levels. Possible ileus  but cannot exclude partial small bowel obstruction.  Electrolytes not depleted. LFTs have been normal but ALT up slightly with initiation of TNA. WBCs were 17.7 2 days ago, empiric Cipro initiated, WBCs now normal. UA is normal.  TSH is normal  Medical mgt includes the NGT, Reglan 10 mg IV q 6, Protonix IV,  Using Dilaudid up to 2 mg daily Takes coumadin for hx A fib, INR subtherapeutic.    Pt says her sxs leading to surgery were acceleration of post prandial pain and nausea.  She never vomits. Her current sxs are that of bloating discomfort, and nausea worsened by po.  This is not like pain before the surgery.  Last BM was about 3 or 4 days ago, loose.  Normally has 3 to 4 soft stools daily.  Has been told she has IBS, as she has intermittent bouts of loose stools and  constipation.  Also says that before surgery, she was having recurrent, mostly solid dysphagia.        Past Medical History  Diagnosis Date  . Depression   . Obesity   . Skin neoplasm   . Bunion   . Carotid stenosis   . Fibromyalgia   . Internal hemorrhoid   . GERD (gastroesophageal reflux disease)   . Chronic gastritis   . Transaminase or LDH elevation   . Mild cognitive impairment   . Hyperlipidemia   . Fatty liver   . Lumbar back pain   . Diabetic peripheral neuropathy   . Diverticulosis   . Dysphagia     no documented strictures but responded positively to dilation in past.   . Hypertension   . Cholecystitis     s/p cholecystectomy  . Sarcoidosis   . Fibromyalgia   . CHF (congestive heart failure)   . Skin cancer 1990's    "front of my right leg"  . Atrial fibrillation   . OSA (obstructive sleep apnea) 2003    "can't sleep w/that darm machine"  . Exertional dyspnea   . Hypothyroidism   . Type II diabetes mellitus   . TIA (transient ischemic attack)     07/29/11 pt denies this histor  . H/O hiatal hernia   . Epileptic seizure, tonic     .No meds since age of 50.  . Osteoarthritis     Past Surgical History  Procedure Date  . Hiatal hernia repair 1990    "had to have scar tissue removed  6 months after repair"  . Esophageal dilation   . Cataract extraction w/phaco 10/27/2010    Procedure: CATARACT EXTRACTION PHACO AND INTRAOCULAR LENS PLACEMENT (IOC);  Surgeon: Susa Simmonds;  Location: [facility];  Service: Ophthalmology;  Laterality: Left;  CDE- 1.78  . Cataract extraction w/phaco 07/13/2011    Procedure: CATARACT EXTRACTION PHACO AND INTRAOCULAR LENS PLACEMENT (IOC);  Surgeon: Susa Simmonds, MD;  Location: [facility];  Service: Ophthalmology;  Laterality: Right;  CDE:  1.65  . Liposuction 1992  . Inverted nipples 1992  . Breast surgery ~ 2010    excision milk duct; right breast  . Foot surgery ~ 2011    ~straightened toe left foot & scraped bone below big  toe"  . Foot surgery ~ 2011    "scraped bone of big toe; shortened middle toe; right foot"  . Cholecystectomy 1980's  . Dilation and curettage of uterus 1970; 1976  . Hernia repair 07/29/11    multiple incarcerated VHR  . Inguinal hernia repair 1990's    left  . Abdominal hysterectomy 1980's    partial  . Tubal ligation 1976  . Bilateral oophorectomy 1980's    "after partial hysterectomy"  . Skin cancer excision 1990's    "front side of right shin"  . Ventral hernia repair 07/29/2011    Procedure: LAPAROSCOPIC VENTRAL HERNIA;  Surgeon: Ernestene Mention, MD;  Location: [facility] OR;  Service: General;  Laterality: N/A;  multiple incarcerated hernias with mesh    Prior to Admission medications   Medication Sig Start Date End Date Taking? Authorizing Provider  ALPRAZolam Prudy Feeler) 0.5 MG tablet Take 1 tablet (0.5 mg total) by mouth at bedtime as needed for sleep or anxiety. 07/23/11 07/22/12 Yes Amanjot Sidhu, MD  amitriptyline (ELAVIL) 50 MG tablet Take 50 mg by mouth at bedtime. 05/08/11 05/07/12 Yes Amanjot Sidhu, MD  atenolol (TENORMIN) 25 MG tablet Take 25 mg by mouth 2 (two) times daily. 05/08/11 05/07/12 Yes Amanjot Sidhu, MD  B Complex-Biotin-FA (SUPER B-50 B COMPLEX PO) Take 1 tablet by mouth 2 (two) times daily.    Yes Historical Provider, MD  Calcium Carbonate-Vitamin D (CALCIUM 600+D) 600-400 MG-UNIT per tablet Take 1 tablet by mouth 2 (two) times daily with a meal.    Yes Historical Provider, MD  cyclobenzaprine (FLEXERIL) 5 MG tablet Take 5 mg by mouth 2 (two) times daily as needed. For fibromyalgia 05/08/11  Yes Melida Quitter, MD  digoxin (LANOXIN) 0.25 MG tablet Take 250 mcg by mouth daily. 05/08/11 06/07/12 Yes Amanjot Sidhu, MD  diltiazem (DILACOR XR) 180 MG 24 hr capsule Take 180 mg by mouth daily. 05/08/11 05/07/12 Yes Amanjot Sidhu, MD  enoxaparin (LOVENOX) 120 MG/0.8ML injection Inject 0.8 mLs (120 mg total) into the skin daily. Take as directed by coumadin clinic, inject daily pre op briding as  directed 07/20/11  Yes Tonny Bollman, MD  fish oil-omega-3 fatty acids 1000 MG capsule Take 1 g by mouth 2 (two) times daily.    Yes Historical Provider, MD  FLUoxetine (PROZAC) 20 MG capsule Take 20 mg by mouth daily. 05/08/11 05/07/12 Yes Amanjot Sidhu, MD  furosemide (LASIX) 40 MG tablet Take 40 mg by mouth daily.   Yes Historical Provider, MD  gabapentin (NEURONTIN) 100 MG capsule Take 100 mg by mouth 2 (two) times daily. 05/08/11  Yes Amanjot Sidhu, MD  glipiZIDE (GLUCOTROL) 10 MG tablet Take 10 mg by mouth 2 (two) times daily before a meal.   Yes Historical Provider, MD  HYDROcodone-acetaminophen Haskell Flirt)  5-500 MG per tablet Take 1 tablet by mouth every 12 (twelve) hours as needed. For pain 05/08/11  Yes Melida Quitter, MD  levothyroxine (SYNTHROID, LEVOTHROID) 75 MCG tablet Take 1 tablet (75 mcg total) by mouth daily. 05/08/11  Yes Melida Quitter, MD  lisinopril (PRINIVIL,ZESTRIL) 10 MG tablet Take 1 tablet (10 mg total) by mouth daily. 05/08/11 05/07/12 Yes Melida Quitter, MD  metFORMIN (GLUCOPHAGE) 1000 MG tablet Take 1 tablet (1,000 mg total) by mouth 2 (two) times daily with a meal. 05/08/11 05/07/12 Yes Amanjot Sidhu, MD  Multiple Vitamin (MULTIVITAMIN) capsule Take 1 capsule by mouth daily.    Yes Historical Provider, MD  niacin (NIASPAN) 500 MG CR tablet Take 2 tablets (1,000 mg total) by mouth 2 (two) times daily. 04/23/11 04/22/12 Yes Amanjot Sidhu, MD  omeprazole (PRILOSEC) 20 MG capsule Take 40 mg by mouth as needed. For acid reflux 02/03/11 02/03/12 Yes Melida Quitter, MD  potassium chloride (K-DUR) 10 MEQ tablet Take 10 mEq by mouth daily. 05/08/11 05/07/12 Yes Amanjot Sidhu, MD  pravastatin (PRAVACHOL) 80 MG tablet Take 80 mg by mouth daily.   Yes Historical Provider, MD  albuterol (PROVENTIL HFA;VENTOLIN HFA) 108 (90 BASE) MCG/ACT inhaler Inhale 2 puffs into the lungs every 6 (six) hours as needed. For shortness of breath 02/03/11 03/04/12  Melida Quitter, MD  warfarin (COUMADIN) 5 MG tablet Take 2.5-5 mg  by mouth daily. Takes 5 mg on Monday Wednesday and Friday. Takes 2.5mg  on Tuesday, Thursday, Saturday, and Sunday. 05/08/11   Melida Quitter, MD    Scheduled Meds:    . bisacodyl  10 mg Rectal Daily  . ciprofloxacin  400 mg Intravenous Q12H  . digoxin  0.125 mg Intravenous Daily  . enoxaparin (LOVENOX) injection  1.5 mg/kg Subcutaneous Q24H  . FLUoxetine  20 mg Oral Daily  . insulin aspart  0-15 Units Subcutaneous Q4H  . iohexol  20 mL Oral Q1 Hr x 2  . ketorolac  1 drop Right Eye BID  . levothyroxine  38 mcg Intravenous QAC breakfast  . lidocaine  20 mL Mouth/Throat Q4H  . magnesium sulfate 1 - 4 g bolus IVPB  1 g Intravenous Once  . metoCLOPramide (REGLAN) injection  10 mg Intravenous Q6H  . metoprolol  5 mg Intravenous Q6H  . pantoprazole (PROTONIX) IV  40 mg Intravenous Q24H  . potassium chloride  10 mEq Intravenous Q1 Hr x 4  . potassium chloride  10 mEq Intravenous Q1 Hr x 3  . potassium chloride  10 mEq Intravenous Q1 Hr x 5  . potassium chloride      . simvastatin  10 mg Oral q1800   Infusions:    . dextrose 5 % and 0.45 % NaCl with KCl 20 mEq/L 100 mL/hr at 08/06/11 2050  . TPN (CLINIMIX) +/- additives 40 mL/hr at 08/06/11 2026   PRN Meds: albuterol, iohexol, ketorolac, magic mouthwash, ondansetron (ZOFRAN) IV, promethazine, sodium chloride,   Allergies as of 06/24/2011 - Review Complete 06/10/2011  Allergen Reaction Noted  . Latex Other (See Comments)   . Adhesive (tape) Other (See Comments) 05/26/2011  . Gemfibrozil Swelling   . Codeine Hives   . Penicillins Hives 03/11/2010    Family History  Problem Relation Age of Onset  . Crohn's disease Mother   . Colitis Mother     Crohns  . Anesthesia problems Mother   . Cancer Father   . Diabetes Father   . Heart disease Father   . Melanoma Father   .  Diabetes Sister   . Depression Maternal Uncle   . Dementia Maternal Uncle   . Hypotension Neg Hx   . Malignant hyperthermia Neg Hx   . Pseudochol deficiency  Neg Hx     History   Social History  . Marital Status: Divorced    Spouse Name: N/A    Number of Children: N/A  . Years of Education: N/A   Occupational History  . Not on file.   Social History Main Topics  . Smoking status: Never Smoker   . Smokeless tobacco: Never Used  . Alcohol Use: No  . Drug Use: No  . Sexually Active: Not Currently   Other Topics Concern  . Not on file   Social History Narrative   Divorced, on disability    REVIEW OF SYSTEMS: Constitutional:  Weight loss of 10 # since surgery. Wt loss in last few months as well ENT:  No nose bleeds, no rhinorrhea Pulm:  No SOB.  Productive cough frequently.  Never smoked CV:  As NGT placed yesterday, brief CP GU:  No dysuria or hematuria GI:  As per HPI Heme:  No hx anemia or unusual bruising/bleeding..    Transfusions:  None ever Neuro:  No seizure.  Tingling and numbness in feet Derm:  Dry skin causes itching Endocrine:  No excessive thirst or sweats Immunization:  Not queried Travel:  None in last several months. MS:  Chronic muscle and joint aches pains.  Uses 1 to 2 vicodin daily about 3 times weekly when at home.     PHYSICAL EXAM: Vital signs in last 24 hours: Temp:  [97.2 F (36.2 C)-98.2 F (36.8 C)] 97.2 F (36.2 C) (05/31 0532) Pulse Rate:  [102-103] 102  (05/31 0532) Resp:  [18] 18  (05/31 0532) BP: (121-135)/(68-74) 121/68 mmHg (05/31 0532) SpO2:  [96 %] 96 % (05/31 0532) Weight 194 #, 87.9 KG  General: looks well except for the NG tube being in place Head:  No asymmetry or signs of trauma  Eyes:  No icterus or conj pallor Ears:  Not HOH  Nose:  No discharge.  ngt in place draining dark bilious material Mouth:  Moist, clear, pink MM Neck:  No mass or JVD Lungs:  Clear B.  Breathing unlabored Heart: Irreg, Irreg.  No MRG Abdomen:  Soft, multiple intact incisions, multiple ink marks.  BS hypoactive. Soft, obese, non-protuberant,  No masses.  Mild to moderate non-focal tenderness.     Rectal: deferred   Musc/Skeltl: no joint deformity or erythema Extremities:  No pedal edema  Neurologic:  Pleasant, no tremor, ambulates without assistance.  Fully oriented,. No anxiety Skin:  No angiomata on chest, no rash Tattoos:  none Nodes:  No adenopathy at neck or groin   Psych:  Pleasant.  Relaxed.   Intake/Output from previous day: 05/30 0701 - 05/31 0700 In: 3229.3 [P.O.:640; I.V.:2233.3; TPN:356] Out: 3850 [Urine:800; Emesis/NG output:3050] Intake/Output this shift:    LAB RESULTS:  Basename 08/07/11 0520 08/06/11 0630 08/05/11 0626  WBC 9.4 16.8* 17.7*  HGB 12.1 13.2 14.1  HCT 35.5* 37.7 40.2  PLT 297 351 287   BMET Lab Results  Component Value Date   NA 134* 08/07/2011   NA 134* 08/06/2011   NA 136 08/05/2011   K 3.4* 08/07/2011   K 3.8 08/06/2011   K 3.7 08/05/2011   CL 99 08/07/2011   CL 96 08/06/2011   CL 98 08/05/2011   CO2 27 08/07/2011   CO2 22 08/06/2011  CO2 21 08/05/2011   GLUCOSE 172* 08/07/2011   GLUCOSE 150* 08/06/2011   GLUCOSE 165* 08/05/2011   BUN 7 08/07/2011   BUN 7 08/06/2011   BUN 7 08/05/2011   CREATININE 0.48* 08/07/2011   CREATININE 0.36* 08/06/2011   CREATININE 0.50 08/05/2011   CALCIUM 9.5 08/07/2011   CALCIUM 9.5 08/06/2011   CALCIUM 10.1 08/05/2011   LFT  Basename 08/07/11 0520 08/05/11 0626  PROT 6.0 7.1  ALBUMIN 2.5* 3.0*  AST 19 15  ALT 36* 15  ALKPHOS 92 90  BILITOT 0.4 1.0  BILIDIR -- --  IBILI -- --   PT/INR Lab Results  Component Value Date   INR 1.45 08/07/2011   INR 1.36 08/06/2011   INR 1.13 08/05/2011   PROTIME 18.8 08/20/2008      RADIOLOGY STUDIES: Dg Chest 2 View 08/06/2011  *RADIOLOGY REPORT*  Clinical Data: Postop hernia repair 1 week ago, upper abdominal pain  CHEST - 2 VIEW  Comparison: Portable chest x-ray of 07/31/2011  Findings: Linear atelectasis remains at the left lung base. Otherwise the lungs appear clear.  There is some elevation of left hemidiaphragm which appears to be due to significant gaseous  distention of the stomach.  Is there any suspicion of gastric outlet abnormality?  The heart is within normal limits in size.  IMPRESSION:  1.  Persistent linear atelectasis at the left lung base. 2.  Gaseous distention of the stomach with elevation of the left hemidiaphragm.  Consider gastric outlet obstruction.  Original Report Authenticated By: Juline Patch, M.D.   Ct Abdomen Pelvis W Contrast  08/06/2011  *RADIOLOGY REPORT*  Clinical Data: Evaluate recurrent postop distention.  Epigastric pain.  Recent ventral laparoscopic hernia repair.  Surgery 07/29/2011.  CT ABDOMEN AND PELVIS WITH CONTRAST  Technique:  Multidetector CT imaging of the abdomen and pelvis was performed following the standard protocol during bolus administration of intravenous contrast.  Contrast: OMNIPAQUE IOHEXOL 300 MG/ML  SOLN  Comparison: Plain film of 08/06/2011.  CT of 06/08/2011.  Findings: Subsegmental atelectasis at the lung bases, greater left than right.  Mild cardiomegaly, without pericardial or pleural effusion.  Trace perihepatic ascites.  Mild hepatic steatosis.  Mild hepatomegaly, 19.6 cm cranial caudal.  Normal spleen, stomach, pancreas. Cholecystectomy without biliary ductal dilatation.  Normal adrenal glands.  Too small to characterize lesions within bilateral kidneys. No retroperitoneal or retrocrural adenopathy.  The colon is normal in caliber. Scattered colonic diverticula. Normal terminal ileum and appendix.  The duodenum and proximal jejunum are dilated, up to 3.9 cm.  The small bowel undergoes a relatively gradual transition to normal to decompressed mid and distal small bowel.  No obstructive mass identified.  No pneumatosis or other signs of small bowel ischemia. No free intraperitoneal air.  No ascites.  Trace right pelvic fluid image 76 is new.  Bilateral fat containing inguinal hernias. No pelvic adenopathy.  Normal urinary bladder. Hysterectomy.  No adnexal mass.  Left anterior pelvic wall laxity versus  a fat-containing spigelian hernia.  Image 71.  Postoperative edema within the subcutaneous fat of the anterior abdomen.  Minimal fluid within the subcutaneous fat on image 47 which is likely postoperative.  4.9 x 1.2 cm.  No surrounding enhancement. No acute osseous abnormality.    Disc bulges at L5 disc bulges at L4-L5 and L5-S1.  IMPRESSION:  1.  Proximal small bowel dilatation, with a relatively gradual transition to normal to decompressed mid to distal small bowel. Favor focal postoperative adynamic ileus.  A low  grade partial small bowel obstruction is felt less likely.  No evidence of ischemia or other acute complication. 2.  Trace perihepatic and right-sided pelvic ascites, likely postoperative. 3.  Minimal fluid in the anterior abdominal wall, likely postoperative.  No significant surrounding enhancement or inflammation to suggest complicating infection.  Original Report Authenticated By: Consuello Bossier, M.D.   Dg Chest Port 1 View 08/06/2011  *RADIOLOGY REPORT*  Clinical Data: PICC placement.  PORTABLE CHEST - 1 VIEW  Comparison: PA and lateral chest 08/06/2011 at 7:47 a.m.  Findings: Right PICC has its tip at the superior cavoatrial junction.  The catheter could be withdrawn 2-2.5 cm for better positioning.  NG tube has in good position with the side port in the stomach.  Lungs are clear.  Heart size normal.  No pneumothorax or pleural fluid.  IMPRESSION:  1.  Tip of right PICC is just within the right atrium.  The catheter could be withdrawn 2-2.5 cm for better positioning. 2.  NG tube in good position. 3.  Resolved left mid lung atelectasis.  Original Report Authenticated By: Bernadene Bell. D'ALESSIO, M.D.   Dg Abd 2 Views 08/06/2011  *RADIOLOGY REPORT*  Clinical Data: Postop hernia repair 1 week ago with pain in the upper abdomen  ABDOMEN - 2 VIEW  Comparison: Abdomen films of 08/04/2011  Findings: There is significant gaseous distention of the stomach with air-fluid level within the stomach. In addition  however there is gaseous distention of loops of small bowel as well.  Although this could be due to postoperative ileus, a partial small bowel obstruction cannot be excluded.  No free air is seen on the erect view.  Surgical clips are present in the right upper quadrant from prior cholecystectomy.  IMPRESSION:   Significant gaseous distention of the stomach with gaseous distention of small bowel with air-fluid levels.  Possible ileus but cannot exclude partial small bowel obstruction.  Original Report Authenticated By: Juline Patch, M.D.    ENDOSCOPIC STUDIES: 11/2009   EGD  For dysphagia. ENDOSCOPIC IMPRESSION:  1) S/p fundoplication  2)  NO OBVIOUS STRICTURE.DILATED #8F MALONEY  DILATOR. other findings. INCREASED MUCUS IN VOCAL CORED AREA.??  POST NASAL DRAINAGE  DILATED PER SYMPTOMS.?? PRIMARY ENT PROBLEM.  RECOMMENDATIONS:  1) continue current medications  2) dilatations PRN  3) post dilation instructions  CONSIDER ENT REFERRAL.  10/2008  egd with dilatation of "snug" fundoplication.   10/2008  Colonoscopy  Jarold Motto ENDOSCOPIC IMPRESSION:  1) Moderate diverticulosis throughout the colon  2) No polyps or cancers    IMPRESSION: *  Post op ileus SB, GOO post lap ventral hernia repairs and extensive LOA one week ago, 5/22.  Has had significant NGT output since it was replaced yesterday.  She is diabetic so wonder if she has element of gastroparesis.  *  Remote repair of HH. Subsequent c/o dysphagia but no strictures, has been empirically dilated. Has recurrent dysphagia. On Omeprazole PTA, IV protonix currently.  *  IDDM *  Fatty liver, has had elevated transaminases in past. *  A fib, chronic coumadin *  Chronic pain  Syndrome, fibromyalgia.  *  Polypharmacy. List of 25 meds/supplements on PTA med list.   * HYPOKALEMIA  PLAN: *  Per Dr Marina Goodell. SEE BELOW   LOS: 9 days   Jennye Moccasin  08/07/2011, 11:30 AM Pager: [phone]  GI ATTENDING.  PATIENT SEEN AND EXAMINED.  EXTENSIVE X-RAYS, LABS, OPERATIVE REPORT, AND PRIOR ENDOSCOPY REPORTS REVIEWED.AGREE WITH ABOVE.  IMPRESSION: PARTIAL SMALL BOWEL OBSTRUCTION  VS. ILEUS WITH HIGH VOLUME NG OUTPUT.   PLAN: 1. AGREE WITH ONGOING NG SUCTION AND D/C NARCOTICS AS WELL AS REGLAN THERAPY IV            2. CORRECT HYPOKALEMIA. BEST IF K+ > 4.0            3. MONITOR NG OUTPUT            4. FOLLOW ABDOMINAL FILMS            5. INCREASE ACTIVITY              6. I DO NOT THINK THAT EGD WILL BE HELPFUL. IF SHE DOES NOT IMPROVE WITH THESE MEASURES, AND MECHANICAL OBSTRUCTION IS CONTEMPLATED, THEN A CONTRAST STUDY VIA THE NGT WOULD BE THE NEXT BEST STEP.  WILL FOLLOW. THANKS.  Wilhemina Bonito. Eda Keys., M.D. [facility] Gastroenterology

## 2011-08-07 NOTE — Progress Notes (Signed)
Internal Medicine Teaching Service Attending Note Date: 08/07/2011  Patient name: [name]  Medical record number: [id number]  Date of birth: 1950/09/06    This patient has been seen and discussed with the house staff. Please see their note for complete details. I concur with their findings with the following additions/corrections:  Ms [name] is known to our team from earlier this month. Had been able to have the NG removed and eat but ileus redeveloped and pt back on NG and NPO. Ng not as bothersome this go round. Feels great otherwise. Was visiting with friends and family at my visit. Dr Yaakov Guthrie changed all meds to IV that could be changed. Fluoxetine is PO but has long half life so no bother if misses a few doses. Now on full dose Lovenox - has A Fib and cannot take warfarin. Working with PT. Cipro started 2/2 increased WBC and UA results. WBC now nl. She is afebrile.  Jalina Blowers 08/07/2011, 5:05 PM

## 2011-08-07 NOTE — Progress Notes (Addendum)
9 Days Post-Op  Subjective: Patient feels much better this morning. She has no pain and no nausea.  passing flatus.  Denies dyspnea.  Abdominal x-rays yesterday showed acute gastric distention and some proximal small bowel dilatation. CT scan showed adynamic ileus, no evidence of transition zone and no evidence of SBO. Contrast did move into small bowel, suggesting she does not have complete GOO, at least.   Also there was no evidence of infection or mesh complication. NG tube was inserted and she has drained 3,000 cc bilious since.  I appreciate the consultation and medication management by the internal medicine teaching service. She was started empirically on Cipro yesterday in case she has a UTI. There was no evidence of pneumonia by chest x-ray.  Lab work this morning shows that the leukocytosis has resolved, WBC 9400.    electrolytes are pending.  She was started on TNA yesterday and that has gone well so far.  Objective: Vital signs in last 24 hours: Temp:  [97.2 F (36.2 C)-98.2 F (36.8 C)] 97.2 F (36.2 C) (05/31 0532) Pulse Rate:  [96-103] 102  (05/31 0532) Resp:  [18] 18  (05/31 0532) BP: (121-164)/(68-82) 121/68 mmHg (05/31 0532) SpO2:  [96 %] 96 % (05/31 0532) Last BM Date: 08/06/11  Intake/Output from previous day: 05/30 0701 - 05/31 0700 In: 3229.3 [P.O.:640; I.V.:2233.3; TPN:356] Out: 3850 [Urine:800; Emesis/NG output:3050] Intake/Output this shift: Total I/O In: 1789.3 [I.V.:1433.3; TPN:356] Out: 1750 [Urine:500; Emesis/NG output:1250]  General appearance: alert. Mental status normal. She looks like she feels much better. Skin warm and dry. Resp: clear to auscultation bilaterally GI: abdomen is soft, nontender, not distended, not tympanitic, all wounds look good. Minimal bowel sounds.  Lab Results:  Results for orders placed during the hospital encounter of 07/29/11 (from the past 24 hour(s))  PROTIME-INR     Status: Abnormal   Collection Time   08/06/11  6:30  AM      Component Value Range   Prothrombin Time 17.0 (*) 11.6 - 15.2 (seconds)   INR 1.36  0.00 - 1.49   CBC     Status: Abnormal   Collection Time   08/06/11  6:30 AM      Component Value Range   WBC 16.8 (*) 4.0 - 10.5 (K/uL)   RBC 4.10  3.87 - 5.11 (MIL/uL)   Hemoglobin 13.2  12.0 - 15.0 (g/dL)   HCT 45.4  09.8 - 11.9 (%)   MCV 92.0  78.0 - 100.0 (fL)   MCH 32.2  26.0 - 34.0 (pg)   MCHC 35.0  30.0 - 36.0 (g/dL)   RDW 14.7  82.9 - 56.2 (%)   Platelets 351  150 - 400 (K/uL)  BASIC METABOLIC PANEL     Status: Abnormal   Collection Time   08/06/11  6:30 AM      Component Value Range   Sodium 134 (*) 135 - 145 (mEq/L)   Potassium 3.8  3.5 - 5.1 (mEq/L)   Chloride 96  96 - 112 (mEq/L)   CO2 22  19 - 32 (mEq/L)   Glucose, Bld 150 (*) 70 - 99 (mg/dL)   BUN 7  6 - 23 (mg/dL)   Creatinine, Ser 1.30 (*) 0.50 - 1.10 (mg/dL)   Calcium 9.5  8.4 - 86.5 (mg/dL)   GFR calc non Af Amer >90  >90 (mL/min)   GFR calc Af Amer >90  >90 (mL/min)  GLUCOSE, CAPILLARY     Status: Abnormal   Collection Time  08/06/11  8:08 AM      Component Value Range   Glucose-Capillary 161 (*) 70 - 99 (mg/dL)   Comment 1 Notify RN    URINALYSIS, ROUTINE W REFLEX MICROSCOPIC     Status: Abnormal   Collection Time   08/06/11  9:00 AM      Component Value Range   Color, Urine AMBER (*) YELLOW    APPearance CLOUDY (*) CLEAR    Specific Gravity, Urine 1.031 (*) 1.005 - 1.030    pH 5.5  5.0 - 8.0    Glucose, UA NEGATIVE  NEGATIVE (mg/dL)   Hgb urine dipstick NEGATIVE  NEGATIVE    Bilirubin Urine SMALL (*) NEGATIVE    Ketones, ur NEGATIVE  NEGATIVE (mg/dL)   Protein, ur 30 (*) NEGATIVE (mg/dL)   Urobilinogen, UA 1.0  0.0 - 1.0 (mg/dL)   Nitrite NEGATIVE  NEGATIVE    Leukocytes, UA SMALL (*) NEGATIVE   URINE MICROSCOPIC-ADD ON     Status: Normal   Collection Time   08/06/11  9:00 AM      Component Value Range   Squamous Epithelial / LPF RARE  RARE    WBC, UA 3-6  <3 (WBC/hpf)   RBC / HPF 0-2  <3 (RBC/hpf)     Bacteria, UA RARE  RARE   GLUCOSE, CAPILLARY     Status: Abnormal   Collection Time   08/06/11  2:02 PM      Component Value Range   Glucose-Capillary 129 (*) 70 - 99 (mg/dL)   Comment 1 Notify RN    GLUCOSE, CAPILLARY     Status: Normal   Collection Time   08/06/11  6:16 PM      Component Value Range   Glucose-Capillary 73  70 - 99 (mg/dL)   Comment 1 Notify RN    GLUCOSE, CAPILLARY     Status: Abnormal   Collection Time   08/06/11  7:46 PM      Component Value Range   Glucose-Capillary 104 (*) 70 - 99 (mg/dL)  GLUCOSE, CAPILLARY     Status: Abnormal   Collection Time   08/06/11 11:53 PM      Component Value Range   Glucose-Capillary 153 (*) 70 - 99 (mg/dL)   Comment 1 Notify RN     Comment 2 Documented in Chart    GLUCOSE, CAPILLARY     Status: Abnormal   Collection Time   08/07/11  3:56 AM      Component Value Range   Glucose-Capillary 153 (*) 70 - 99 (mg/dL)   Comment 1 Notify RN     Comment 2 Documented in Chart    PROTIME-INR     Status: Abnormal   Collection Time   08/07/11  5:20 AM      Component Value Range   Prothrombin Time 17.9 (*) 11.6 - 15.2 (seconds)   INR 1.45  0.00 - 1.49   CBC     Status: Abnormal   Collection Time   08/07/11  5:20 AM      Component Value Range   WBC 9.4  4.0 - 10.5 (K/uL)   RBC 3.79 (*) 3.87 - 5.11 (MIL/uL)   Hemoglobin 12.1  12.0 - 15.0 (g/dL)   HCT 16.1 (*) 09.6 - 46.0 (%)   MCV 93.7  78.0 - 100.0 (fL)   MCH 31.9  26.0 - 34.0 (pg)   MCHC 34.1  30.0 - 36.0 (g/dL)   RDW 04.5  40.9 - 81.1 (%)   Platelets  297  150 - 400 (K/uL)  DIFFERENTIAL     Status: Normal   Collection Time   08/07/11  5:20 AM      Component Value Range   Neutrophils Relative 63  43 - 77 (%)   Neutro Abs 6.0  1.7 - 7.7 (K/uL)   Lymphocytes Relative 24  12 - 46 (%)   Lymphs Abs 2.2  0.7 - 4.0 (K/uL)   Monocytes Relative 9  3 - 12 (%)   Monocytes Absolute 0.8  0.1 - 1.0 (K/uL)   Eosinophils Relative 4  0 - 5 (%)   Eosinophils Absolute 0.4  0.0 - 0.7 (K/uL)    Basophils Relative 0  0 - 1 (%)   Basophils Absolute 0.0  0.0 - 0.1 (K/uL)     Studies/Results: @RISRSLT24 @     . bisacodyl  10 mg Rectal Daily  . ciprofloxacin  400 mg Intravenous Q12H  . digoxin  0.125 mg Intravenous Daily  . enoxaparin (LOVENOX) injection  1.5 mg/kg Subcutaneous Q24H  . FLUoxetine  20 mg Oral Daily  . insulin aspart  0-15 Units Subcutaneous Q4H  . iohexol  20 mL Oral Q1 Hr x 2  . ketorolac  1 drop Right Eye BID  . levothyroxine  38 mcg Intravenous QAC breakfast  . lidocaine  20 mL Mouth/Throat Q4H  . metoCLOPramide (REGLAN) injection  10 mg Intravenous Q6H  . metoprolol  5 mg Intravenous Q6H  . pantoprazole (PROTONIX) IV  40 mg Intravenous Q24H  . potassium chloride  10 mEq Intravenous Q1 Hr x 4  . potassium chloride  10 mEq Intravenous Q1 Hr x 3  . simvastatin  10 mg Oral q1800  . DISCONTD: amitriptyline  50 mg Oral QHS  . DISCONTD: atenolol  25 mg Oral BID  . DISCONTD: digoxin  0.25 mg Oral Daily  . DISCONTD: diltiazem  180 mg Oral Daily  . DISCONTD: enoxaparin (LOVENOX) injection  120 mg Subcutaneous Q24H  . DISCONTD: enoxaparin (LOVENOX) injection  40 mg Subcutaneous Q24H  . DISCONTD: gabapentin  100 mg Oral BID  . DISCONTD: glipiZIDE  10 mg Oral BID AC  . DISCONTD: insulin aspart  0-15 Units Subcutaneous TID WC & HS  . DISCONTD: levothyroxine  75 mcg Oral QAC breakfast  . DISCONTD: lisinopril  10 mg Oral Daily  . DISCONTD: metFORMIN  1,000 mg Oral BID WC  . DISCONTD: mulitivitamin with minerals  1 tablet Oral Daily  . DISCONTD: pantoprazole  40 mg Oral Q1200  . DISCONTD: pantoprazole (PROTONIX) IV  40 mg Intravenous Q24H  . DISCONTD: warfarin  7.5 mg Oral ONCE-1800  . DISCONTD: Warfarin - Pharmacist Dosing Inpatient   Does not apply q1800     Assessment/Plan: s/p Procedure(s): LAPAROSCOPIC VENTRAL HERNIA INSERTION OF MESH  POD #8. She still has an ileus at least, and may have diabetic gastroparesis and/or gastric outlet obstruction. It is  unclear why this has been prolonged/recurrent in absence of surgical complication.  Continue NG suction and TNA Maintain electrolyte balance and get K level above 4.0. Will restart Reglan. Narcotics discontinued, patient aware and agrees. Toradol PRN. Will ask  GI to see to see if they have any advice regarding motility disorder, and to see if they think she might benefit from upper endoscopy to rule out gastric outlet obstruction.  Protein calorie malnutrition. TNA started yesterday.  Leukocytosis. Resolved. Check urine culture. Otherwise no clinical or radiographic evidence of infection.   On empiric Cipro.  Atrial fibrillation. Stable. On  full dose Lovenox, IV digoxin, and IV beta blockers. Coumadin and Cardizem are on hold for now.  Type 2 diabetes. Reasonably well-controlled on Lantus and sliding scale insulin. Resume oral medications once GI problems resolve  Hypothyroidism. TSH normal. Back on IV Synthroid  Hypertension. Well-controlled  Chronic anxiety and depression. Currently not much of a problem. To try p.o. Prozac to avoid withdrawal.  Somewhat deconditioned. Home health PT, home health OT., and home health nursing has been requested. I have discussed this with the case manager yesterday.    LOS: 9 days    Reyhan Moronta M. Derrell Lolling, M.D., [facility] General and Minimally invasive Surgery Breast and Colorectal Surgery Office:   [phone] Pager:   [phone]  08/07/2011  . .prob

## 2011-08-07 NOTE — Progress Notes (Signed)
PARENTERAL NUTRITION CONSULT NOTE - FOLLOW UP  Pharmacy Consult for TPN Indication: adynamic Ileus s/p ventral hernia repair   Allergies  Allergen Reactions  . Gemfibrozil Swelling    REACTION: Angioedema  . Latex Other (See Comments)    Blisters where touched or applied  . Penicillins Hives    Will spread in patches all over the body.  . Adhesive (Tape) Other (See Comments)    Will blister skin where applied - do not use BAND-AIDS.  Marland Kitchen Codeine Hives    Will spread in patches all over the body.    Patient Measurements: Height: 5\' 3"  (160 cm) Weight: 193 lb 14.4 oz (87.952 kg) IBW/kg (Calculated) : Kimberly.4  Adjusted Body Weight: 63  BMI: 34  Vital Signs: Temp: 97.2 F (36.2 C) (05/31 0532) Temp src: Oral (05/30 2202) BP: 121/68 mmHg (05/31 0532) Pulse Rate: 102  (05/31 0532) Intake/Output from previous day: 05/30 0701 - 05/31 0700 In: 3229.3 [P.O.:640; I.V.:2233.3; TPN:356] Out: 3850 [Urine:800; Emesis/NG output:3050] Intake/Output from this shift:    Labs:  [facility] 08/07/11 0520 08/06/11 0630 08/05/11 0626  WBC 9.4 16.8* 17.7*  HGB 12.1 13.2 14.1  HCT 35.5* 37.7 40.2  PLT 297 351 287  APTT -- -- --  INR 1.45 1.36 1.13     Basename 08/07/11 0520 08/06/11 0630 08/05/11 0626  NA 134* 134* 136  K 3.4* 3.8 3.7  CL 99 96 98  CO2 27 22 21   GLUCOSE 172* 150* 165*  BUN 7 7 7   CREATININE 0.48* 0.36* 0.50  LABCREA -- -- --  CREAT24HRUR -- -- --  CALCIUM 9.5 9.5 10.1  MG 1.5 -- --  PHOS 3.0 -- --  PROT 6.0 -- 7.1  ALBUMIN 2.5* -- 3.0*  AST 19 -- 15  ALT 36* -- 15  ALKPHOS 92 -- 90  BILITOT 0.4 -- 1.0  BILIDIR -- -- --  IBILI -- -- --  PREALBUMIN -- -- --  TRIG 173* -- --  CHOLHDL -- -- --  CHOL 108 -- --   Estimated Creatinine Clearance: 75.7 ml/min (by C-G formula based on Cr of 0.48).    Basename 08/07/11 0356 08/06/11 2353 08/06/11 1946  GLUCAP 153* 153* 104*   Medications:  Scheduled:    . bisacodyl  10 mg Rectal Daily  . ciprofloxacin  400  mg Intravenous Q12H  . digoxin  0.125 mg Intravenous Daily  . enoxaparin (LOVENOX) injection  1.5 mg/kg Subcutaneous Q24H  . FLUoxetine  20 mg Oral Daily  . insulin aspart  0-15 Units Subcutaneous Q4H  . iohexol  20 mL Oral Q1 Hr x 2  . ketorolac  1 drop Right Eye BID  . levothyroxine  38 mcg Intravenous QAC breakfast  . lidocaine  20 mL Mouth/Throat Q4H  . metoCLOPramide (REGLAN) injection  10 mg Intravenous Q6H  . metoprolol  5 mg Intravenous Q6H  . pantoprazole (PROTONIX) IV  40 mg Intravenous Q24H  . potassium chloride  10 mEq Intravenous Q1 Hr x 4  . potassium chloride  10 mEq Intravenous Q1 Hr x 3  . potassium chloride  10 mEq Intravenous Q1 Hr x 5  . simvastatin  10 mg Oral q1800  . DISCONTD: amitriptyline  50 mg Oral QHS  . DISCONTD: atenolol  25 mg Oral BID  . DISCONTD: digoxin  0.25 mg Oral Daily  . DISCONTD: diltiazem  180 mg Oral Daily  . DISCONTD: enoxaparin (LOVENOX) injection  120 mg Subcutaneous Q24H  . DISCONTD: enoxaparin (LOVENOX) injection  40 mg Subcutaneous Q24H  . DISCONTD: gabapentin  100 mg Oral BID  . DISCONTD: glipiZIDE  10 mg Oral BID AC  . DISCONTD: insulin aspart  0-15 Units Subcutaneous TID WC & HS  . DISCONTD: levothyroxine  75 mcg Oral QAC breakfast  . DISCONTD: lisinopril  10 mg Oral Daily  . DISCONTD: metFORMIN  1,000 mg Oral BID WC  . DISCONTD: mulitivitamin with minerals  1 tablet Oral Daily  . DISCONTD: pantoprazole  40 mg Oral Q1200  . DISCONTD: pantoprazole (PROTONIX) IV  40 mg Intravenous Q24H  . DISCONTD: warfarin  7.5 mg Oral ONCE-1800  . DISCONTD: Warfarin - Pharmacist Dosing Inpatient   Does not apply q1800   Infusions:    . dextrose 5 % and 0.45 % NaCl with KCl 20 mEq/L 100 mL/hr at 08/06/11 2050  . TPN (CLINIMIX) +/- additives 40 mL/hr at 08/06/11 2026   PRN: albuterol, iohexol, ketorolac, magic mouthwash, ondansetron (ZOFRAN) IV, promethazine, sodium chloride, DISCONTD: guaiFENesin, DISCONTD: HYDROcodone-acetaminophen, DISCONTD:   HYDROmorphone (DILAUDID) injection  Insulin Requirements in the past 24 hours:  5 units  Current Nutrition:  Clinimix 5/15 at 40ml/hr  NPO  Nutritional Goals:  1750-1970 kCal, 62-74 grams of protein per day  Assessment: [name] s/p ventral hernia repair (5/22) with CT abdomin showing adynamic ileus. Patient with a history of DM and may have diabetic gastroparesis and/or gastric outlet obstruction as well. NGT placed 5/30 with significant amount of bilious drainage. TNA started 5/30. Pt with positive flatus and bowel movements. PO medications changed to IV 5/30.   GI: Post op day #9 s/p ventral hernia repair. CT abdomin showing adynamic ileus +/- gastroparesis/GOO. Pt passing flatus/BM. NGT placed 5/30 with 3035ml/24hr bilious drainage.  HX GERD/gastritis on ppi. Reglan/bisacodyl on board. Endo: Hx Hypothyroidism/DM on home synthroid (IV) and TSH wnl. HbA1c 6.3. PO metformin/glipizide on hold d/t inability to swallow. CBGs between 73-172 with 10 units of insulin in TPN and SSI.  Lytes: Hyponatremia (134), K 3.4 (goal of >4 in ileus), Magnesium 1.5 (goal >2 in ileus). Corr Ca slightly elevated at 10.7 (CaxPhos = 32).  Renal: Scr 0.48 (stable), UOP not being accurately documented.  Pulm: RA  Cards: Hx Afib/HTN/HLD/CHF (no recent echo), BP ok, tachycardic on IV digoxin, metoprolol (atelolol, diltiazem, lisinopril, statin pta po meds on hold) Hepatobil: Baseline Alk phos/LFTs WNL, Trigs ok at 173 Neuro: Hx of depression/anxiety on fluoxetine (amytripyline on hold)  ID: Afebrile, WBC 9.4, Started empirically on cipro for UTI, Urine culture sent  Best Practices: Lovenox, po ppi, home meds resumed   Plan:  -Change to Clinimix E5/20 at 35ml/hr (goal) to provide an average of 1579 kcal/day and 78g protein based on RD assessment (providing 92% kcal and 100% protein needs)   -IV trace elements, multivitamin, and lipids 20% MWF due to national shortage  -Increase to 20 units of insulin in TPN    -Monitor CBG tomorrow to determine insulin requirements  -KCl 10 mEq IV x 5 runs  -Mg 1g IV today  -f/u BMET, phos, mg tomorrow   Thank you,  Brett Fairy, PharmD Pager: [phone]  08/07/2011 8:06 AM

## 2011-08-08 ENCOUNTER — Inpatient Hospital Stay (HOSPITAL_COMMUNITY): Payer: Medicare Other

## 2011-08-08 DIAGNOSIS — E46 Unspecified protein-calorie malnutrition: Secondary | ICD-10-CM

## 2011-08-08 DIAGNOSIS — E876 Hypokalemia: Secondary | ICD-10-CM

## 2011-08-08 LAB — PROTIME-INR
INR: 1.12 (ref 0.00–1.49)
Prothrombin Time: 14.6 seconds (ref 11.6–15.2)

## 2011-08-08 LAB — CBC
HCT: 37.6 % (ref 36.0–46.0)
Hemoglobin: 13 g/dL (ref 12.0–15.0)
MCH: 32 pg (ref 26.0–34.0)
MCHC: 34.6 g/dL (ref 30.0–36.0)
Platelets: 345 10*3/uL (ref 150–400)
RBC: 4.06 MIL/uL (ref 3.87–5.11)
RDW: 12.4 % (ref 11.5–15.5)
WBC: 9.7 10*3/uL (ref 4.0–10.5)

## 2011-08-08 LAB — BASIC METABOLIC PANEL
BUN: 7 mg/dL (ref 6–23)
CO2: 26 mEq/L (ref 19–32)
Calcium: 9.6 mg/dL (ref 8.4–10.5)
Chloride: 98 mEq/L (ref 96–112)
GFR calc Af Amer: >90 mL/min (ref >90)
Potassium: 3.8 mEq/L (ref 3.5–5.1)
Sodium: 136 mEq/L (ref 135–145)

## 2011-08-08 LAB — GLUCOSE, CAPILLARY
Glucose-Capillary: 157 mg/dL — ABNORMAL HIGH (ref 70–99)
Glucose-Capillary: 147 mg/dL — ABNORMAL HIGH (ref 70–99)
Glucose-Capillary: 167 mg/dL — ABNORMAL HIGH (ref 70–99)
Glucose-Capillary: 162 mg/dL — ABNORMAL HIGH (ref 70–99)
Glucose-Capillary: 152 mg/dL — ABNORMAL HIGH (ref 70–99)

## 2011-08-08 LAB — PHOSPHORUS: Phosphorus: 3.4 mg/dL (ref 2.3–4.6)

## 2011-08-08 LAB — MAGNESIUM: Magnesium: 1.6 mg/dL (ref 1.5–2.5)

## 2011-08-08 MED ORDER — MAGNESIUM SULFATE 40 MG/ML IJ SOLN
2.0000 g | Freq: Once | INTRAMUSCULAR | Status: AC
  Administered 2011-08-08: 2 g via INTRAVENOUS
  Filled 2011-08-08: qty 50

## 2011-08-08 MED ORDER — POTASSIUM CHLORIDE 10 MEQ/50ML IV SOLN
10.0000 meq | INTRAVENOUS | Status: AC
  Administered 2011-08-08 (×5): 10 meq via INTRAVENOUS
  Filled 2011-08-08 (×5): qty 50

## 2011-08-08 MED ORDER — INSULIN REGULAR HUMAN 100 UNIT/ML IJ SOLN
INTRAVENOUS | Status: AC
  Administered 2011-08-08: 18:00:00 via INTRAVENOUS
  Filled 2011-08-08: qty 2000

## 2011-08-08 MED ORDER — WHITE PETROLATUM GEL
Status: AC
  Filled 2011-08-08: qty 5

## 2011-08-08 NOTE — Progress Notes (Signed)
Subjective: Patient states that she feels ok. No nausea, vomiting or abdominal pain. Patient endorses passing flatulence and had one normal BM last night. she states that she has been ambulating daily with staff/PT. No acute issue last night.  Objective: Vital signs in last 24 hours: Filed Vitals:   08/07/11 2136 2011/08/22 0020 2011-08-22 0510 22-Aug-2011 1415  BP: 145/65 147/78 150/77 138/86  Pulse: 83 94 96 105  Temp: 98.3 F (36.8 C)  97.3 F (36.3 C) 98.8 F (37.1 C)  TempSrc: Oral  Oral Oral  Resp: 20  18 18   Height:      Weight:      SpO2: 96%  98% 100%   Weight change:   Intake/Output Summary (Last 24 hours) at 08/22/2011 2030 Last data filed at 2011/08/22 2000  Gross per 24 hour  Intake 4706.5 ml  Output   3400 ml  Net 1306.5 ml  General: NAD. NGT in place  Cardiac: irregularly irregular rhythm, no M/G/R  Pulm: CTA B/L  Abd: Soft, active BS x 4. Mild tenderness to palpation, mildly distended. Multiple laparoscopic incisions healing well.  Ext: warm and well perfused, no pedal edema. No tenderness or swelling. No erythema.  Neuro: alert and oriented X3, cranial nerves II-XII grossly intact  Lab Results: Basic Metabolic Panel:  Lab 08/22/2011 1610 08/07/11 0520  Hartley Urton 136 134*  K 3.8 3.4*  CL 98 99  CO2 26 27  GLUCOSE 173* 172*  BUN 7 7  CREATININE 0.46* 0.48*  CALCIUM 9.6 9.5  MG 1.6 1.5  PHOS 3.4 3.0   Liver Function Tests:  Lab 08/07/11 0520 08/05/11 0626  AST 19 15  ALT 36* 15  ALKPHOS 92 90  BILITOT 0.4 1.0  PROT 6.0 7.1  ALBUMIN 2.5* 3.0*   CBC:  Lab 2011-08-22 0430 08/07/11 0520 08/05/11 0626  WBC 9.7 9.4 --  NEUTROABS -- 6.0 14.1*  HGB 13.0 12.1 --  HCT 37.6 35.5* --  MCV 92.6 93.7 --  PLT 345 297 --   CBG:  Lab 2011-08-22 1951 2011/08/22 1603 08/22/11 1225 08-22-11 0738 22-Aug-2011 0338 08/07/11 2334  GLUCAP 152* 162* 167* 147* 157* 168*   Fasting Lipid Panel:  Lab 08/07/11 0520  CHOL 108  HDL --  LDLCALC --  TRIG 173*  CHOLHDL --  LDLDIRECT --    Coagulation:  Lab Aug 22, 2011 0430 08/07/11 0520 08/06/11 0630 08/05/11 0626  LABPROT 14.6 17.9* 17.0* 14.7  INR 1.12 1.45 1.36 1.13   Urinalysis:  Lab 08/06/11 0900  COLORURINE AMBER*  LABSPEC 1.031*  PHURINE 5.5  GLUCOSEU NEGATIVE  HGBUR NEGATIVE  BILIRUBINUR SMALL*  KETONESUR NEGATIVE  PROTEINUR 30*  UROBILINOGEN 1.0  NITRITE NEGATIVE  LEUKOCYTESUR SMALL*   Micro Results: Recent Results (from the past 240 hour(s))  URINE CULTURE     Status: Normal   Collection Time   08/06/11  9:00 AM      Component Value Range Status Comment   Specimen Description URINE, CLEAN CATCH   Final    Special Requests NONE   Final    Culture  Setup Time [id number]   Final    Colony Count 55,000 COLONIES/ML   Final    Culture     Final    Value: Multiple bacterial morphotypes present, none predominant. Suggest appropriate recollection if clinically indicated.   Report Status 08/07/2011 FINAL   Final    Studies/Results: Dg Abd 2 Views  August 22, 2011  *RADIOLOGY REPORT*  Clinical Data: Postoperative ileus.  Gastric distention.  ABDOMEN -  2 VIEW  Comparison: CT abdomen pelvis 08/06/2011.  Plain films the abdomen 08/04/2011 and 08/06/2011.  Findings: NG tube is in place.  Distention of the stomach persists but appears improved.  There is persistent gaseous distention of small bowel loops measuring up to 6.1 cm, slightly improved. Contrast material from the patient's CT scan is now visualized in the colon.  No free intraperitoneal air is identified.  IMPRESSION:  1.  Mild improvement in small bowel dilatation compatible with improved ileus or obstruction.  Contrast material is now seen the colon. 2.  Some improvement gaseous distention of the stomach. 3.  No free peritoneal air.  Original Report Authenticated By: Bernadene Bell. Maricela Curet, M.D.   Medications: I have reviewed the patient's current medications. Scheduled Meds:   . bisacodyl  10 mg Rectal Daily  . ciprofloxacin  400 mg Intravenous Q12H  .  digoxin  0.125 mg Intravenous Daily  . enoxaparin (LOVENOX) injection  1.5 mg/kg Subcutaneous Q24H  . FLUoxetine  20 mg Oral Daily  . insulin aspart  0-15 Units Subcutaneous Q4H  . ketorolac  1 drop Right Eye BID  . levothyroxine  38 mcg Intravenous QAC breakfast  . lidocaine  20 mL Mouth/Throat Q4H  . magnesium sulfate 1 - 4 g bolus IVPB  2 g Intravenous Once  . metoCLOPramide (REGLAN) injection  10 mg Intravenous Q6H  . metoprolol  5 mg Intravenous Q6H  . pantoprazole (PROTONIX) IV  40 mg Intravenous Q24H  . potassium chloride  10 mEq Intravenous Q1 Hr x 5  . potassium chloride      . simvastatin  10 mg Oral q1800  . white petrolatum       Continuous Infusions:   . dextrose 5 % and 0.45 % NaCl with KCl 20 mEq/L 100 mL/hr at 08/08/11 0329  . fat emulsion 250 mL (08/07/11 2148)  . TPN (CLINIMIX) +/- additives    . TPN (CLINIMIX) +/- additives 65 mL/hr at 08/08/11 1827   PRN Meds:.albuterol, ketorolac, magic mouthwash, ondansetron (ZOFRAN) IV, promethazine, sodium chloride Assessment/Plan:  # Post Op ileus:  Ventral hernia s/p laparoscopic lysis of adhesions and hernia repair with mesh 07/29/11: Patient had low clinical response with postop ileus. NGT was removed and reinserted due to significant N/V. Abd CT showed focal post op ileus.>>>better now    -continue NPO and NG per Surgical service  - TPN - All home regimen switched to IV is possible  - hold gabapentin and amitriptyline for now since on narcotics for pain. Restart these on discharge.   # Hypomagnesemia and hypokalemia  - replaced by CCS  - will follow his BMP and Mg   # Leukocytosis:resolved Patient Afebrile.  Most likely causes are persistent ileus / postoperative state vs possible UTI.  No evidence of PNA (CXR), DVT (No S/S on exam and on full dose Lovenox) and Incision (No S/S).   -UA Mild pyuria, rare bacteria, negative nitrates  -Urine Cx>>>indicated non clean catch. - will continue cipro for short course  for presumed UTI.   Afib: stable, rates ~100 bpm at goal on atenolol 25mg  PO BID, Diltiazem XR 180mg  PO daily, Digoxin 0.25 mg daily.  - full dose lovenox  - Restart warfarin once tolerating PO.  - Digoxin 0.125mg  IV daily while NPO with NGT  - metoprolol 5mg  IV Q6H and hold PO atenolol and PO Diltiazem XR   DM2: Well controlled. A1c- 6.3. CBGs look good in recent days.  - Continue Lantus at 10 units and SSI>>>CBG  good control  - Hold oral medications while NGT   Hypothyroidism: TSH- 0.756. Normal.  -Will convert synthroid to IV while NGT in place.  -Restart home PO synthroid dose when tolerating PO   Hypertension: BP up an down but control ok overall.  -Hold PO meds (Lisinopril, Cardizem, and Atenolol).  -Start metoprolol 5mg  IV Q6H in place of these medications for rate control and blood pressure  -can resume home regimen once tolerating POs   Hx of TIA/CVA: Unclear when pt had it. She refuses any Hx though.  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 This was discussed by Dr. Dierdre Searles with Dr. Pearlean Brownie with Neurology.  -Lovenox full dose in place of warfarin while NPO. Restart warfarin when she again gets back to POs.  Depression: Continue PO Prozac if she can swallow it to avoid SSRI withdrawal  GERD: Will convert Protonix to IV while NPO with NGT.  DVT Prophylaxis: Lovenox        LOS: 10 days   [name] 08/08/2011, 8:30 PM

## 2011-08-08 NOTE — Progress Notes (Signed)
Patient ID: [name], female   DOB: [birth year], 61 y.o.   MRN: [id number] [facility] Progress Note  Subjective: Feeling much better- she has had a BM and is passing flatus,has been up walking  Objective:  Vital signs in last 24 hours: Temp:  [97.3 F (36.3 C)-98.3 F (36.8 C)] 97.3 F (36.3 C) (06/01 0510) Pulse Rate:  [77-96] 96  (06/01 0510) Resp:  [18-22] 18  (06/01 0510) BP: (125-170)/(65-98) 150/77 mmHg (06/01 0510) SpO2:  [96 %-99 %] 98 % (06/01 0510) Last BM Date: 08/08/11 General:   Alert,  Well-developed,    in NAD Heart:  Regular rate and rhythm; no murmurs Pulm;clear Abdomen:  Softer tender  rather diffusely, less distended.  bowel sounds present without guarding, and without rebound.   Extremities:  Without edema. Neurologic:  Alert and  oriented x4;  grossly normal neurologically. Psych:  Alert and cooperative. Normal mood and affect.  Intake/Output from previous day: 05/31 0701 - 06/01 0700 In: 4349 [I.V.:2344; IV Piggyback:600; TPN:1405] Out: 3250 [Urine:1350; Emesis/NG output:1900] Intake/Output this shift:    Lab Results:  Basename 08/08/11 0430 08/07/11 0520 08/06/11 0630  WBC 9.7 9.4 16.8*  HGB 13.0 12.1 13.2  HCT 37.6 35.5* 37.7  PLT 345 297 351   BMET  Basename 08/08/11 0430 08/07/11 0520 08/06/11 0630  NA 136 134* 134*  K 3.8 3.4* 3.8  CL 98 99 96  CO2 26 27 22   GLUCOSE 173* 172* 150*  BUN 7 7 7   CREATININE 0.46* 0.48* 0.36*  CALCIUM 9.6 9.5 9.5   LFT  Basename 08/07/11 0520  PROT 6.0  ALBUMIN 2.5*  AST 19  ALT 36*  ALKPHOS 92  BILITOT 0.4  BILIDIR --  IBILI --   PT/INR  Basename 08/08/11 0430 08/07/11 0520  LABPROT 14.6 17.9*  INR 1.12 1.45    Assessment / Plan: #1  61 yo female with post op Ileus vs partial SBO-  Abdominal films today do show some improvement. Continue NG for now, encourage ambulation etc Repeat films in am. #2 hypokalemia -corrected-BMET in am Principal Problem:  *Ventral hernia  s/p laparoscopic lysis of adhesions and hernia repair with mesh 07/29/11 Active Problems:  DIABETES MELLITUS, TYPE II  Atrial fibrillation  Nonspecific (abnormal) findings on radiological and other examination of gastrointestinal tract  Partial bowel obstruction  Hypokalemia  Abdominal pain, generalized     LOS: 10 days   Amy Esterwood  08/08/2011, 11:47 AM    GI ATTENDING  Patient was personally seen and examined. Laboratories reviewed. X-rays reviewed.She was deathly doing better. Agree with assessment and plans as above. Essentially reinforcing yesterday's recommendations.  Will follow  Wilhemina Bonito. Eda Keys., M.D. [facility] Division of Gastroenterology

## 2011-08-08 NOTE — Progress Notes (Addendum)
PARENTERAL NUTRITION CONSULT NOTE - FOLLOW UP  Pharmacy Consult for TPN Indication: adynamic Ileus s/p ventral hernia repair   Allergies  Allergen Reactions  . Gemfibrozil Swelling    REACTION: Angioedema  . Latex Other (See Comments)    Blisters where touched or applied  . Penicillins Hives    Will spread in patches all over the body.  . Adhesive (Tape) Other (See Comments)    Will blister skin where applied - do not use BAND-AIDS.  Marland Kitchen Codeine Hives    Will spread in patches all over the body.    Patient Measurements: Height: 5\' 3"  (160 cm) Weight: 193 lb 14.4 oz (87.952 kg) IBW/kg (Calculated) : 52.4  Adjusted Body Weight: 63  BMI: 34  Vital Signs: Temp: 97.3 F (36.3 C) (06/01 0510) Temp src: Oral (06/01 0510) BP: 150/77 mmHg (06/01 0510) Pulse Rate: 96  (06/01 0510) Intake/Output from previous day: 05/31 0701 - 06/01 0700 In: 4349 [I.V.:2344; IV Piggyback:600; TPN:1405] Out: 3250 [Urine:1350; Emesis/NG output:1900] Intake/Output from this shift:    Labs:  [facility] 08/08/11 0430 08/07/11 0520 08/06/11 0630  WBC 9.7 9.4 16.8*  HGB 13.0 12.1 13.2  HCT 37.6 35.5* 37.7  PLT 345 297 351  APTT -- -- --  INR 1.12 1.45 1.36     Basename 08/08/11 0430 08/07/11 0520 08/06/11 0630  NA 136 134* 134*  K 3.8 3.4* 3.8  CL 98 99 96  CO2 26 27 22   GLUCOSE 173* 172* 150*  BUN 7 7 7   CREATININE 0.46* 0.48* 0.36*  LABCREA -- -- --  CREAT24HRUR -- -- --  CALCIUM 9.6 9.5 9.5  MG 1.6 1.5 --  PHOS 3.4 3.0 --  PROT -- 6.0 --  ALBUMIN -- 2.5* --  AST -- 19 --  ALT -- 36* --  ALKPHOS -- 92 --  BILITOT -- 0.4 --  BILIDIR -- -- --  IBILI -- -- --  PREALBUMIN -- 13.3* --  TRIG -- 173* --  CHOLHDL -- -- --  CHOL -- 108 --   Estimated Creatinine Clearance: 75.7 ml/min (by C-G formula based on Cr of 0.46).    Basename 08/08/11 0738 08/08/11 0338 08/07/11 2334  GLUCAP 147* 157* 168*   Medications:  Scheduled:     . bisacodyl  10 mg Rectal Daily  . ciprofloxacin   400 mg Intravenous Q12H  . digoxin  0.125 mg Intravenous Daily  . enoxaparin (LOVENOX) injection  1.5 mg/kg Subcutaneous Q24H  . FLUoxetine  20 mg Oral Daily  . insulin aspart  0-15 Units Subcutaneous Q4H  . ketorolac  1 drop Right Eye BID  . levothyroxine  38 mcg Intravenous QAC breakfast  . lidocaine  20 mL Mouth/Throat Q4H  . magnesium sulfate 1 - 4 g bolus IVPB  1 g Intravenous Once  . metoCLOPramide (REGLAN) injection  10 mg Intravenous Q6H  . metoprolol  5 mg Intravenous Q6H  . pantoprazole (PROTONIX) IV  40 mg Intravenous Q24H  . potassium chloride  10 mEq Intravenous Q1 Hr x 5  . potassium chloride  10 mEq Intravenous Q1 Hr x 5  . potassium chloride      . potassium chloride      . potassium chloride      . potassium chloride      . simvastatin  10 mg Oral q1800   Infusions:     . dextrose 5 % and 0.45 % NaCl with KCl 20 mEq/L 100 mL/hr at 08/08/11 0329  . fat  emulsion 250 mL (08/07/11 2148)  . TPN (CLINIMIX) +/- additives 40 mL/hr at 08/06/11 2026  . TPN (CLINIMIX) +/- additives     PRN: albuterol, ketorolac, magic mouthwash, ondansetron (ZOFRAN) IV, promethazine, sodium chloride  Insulin Requirements in the past 24 hours:  11 units; 20 units insulin / BAG TNA, moderate SSI q4h.  Current Nutrition:  Clinimix E5/20 at 18ml/hr  NPO  Nutritional Goals:  1750-1970 kCal, 62-74 grams of protein per day  Assessment: 21 yof s/p ventral hernia repair (5/22) with CT abdomin showing adynamic ileus. Patient with a history of DM and may have diabetic gastroparesis and/or gastric outlet obstruction as well. NGT placed 5/30 with significant amount of bilious drainage. TNA started 5/30. Pt with positive flatus and bowel movements. PO medications changed to IV 5/30.   GI: Post op day #10 s/p ventral hernia repair. CT abdomin showing adynamic ileus +/- gastroparesis/GOO. Pt passing flatus/BM. NGT placed 5/30 with 1970ml/24hr bilious drainage.  HX GERD/gastritis on ppi.  Reglan/bisacodyl on board. Endo: Hx Hypothyroidism/DM on home synthroid (IV) and TSH wnl. HbA1c 6.3. PO metformin/glipizide on hold d/t inability to swallow. CBGs slightly greater than goal of <150 with 10 units of insulin in TPN and SSI.  Lytes: K improved but still less than goal of >4 w/ ileus, Magnesium 1.6 (goal >2 in ileus). Corr Ca slightly elevated at 10.7 (CaxPhos = 32).  Renal: Scr 0.48 (stable), UOP not being accurately documented.  Pulm: RA  Cards: Hx Afib/HTN/HLD/CHF (no recent echo), BP ok, tachycardic on IV digoxin, metoprolol (atelolol, diltiazem, lisinopril, statin pta po meds on hold) Hepatobil: Baseline Alk phos/LFTs WNL, Trigs ok at 173 Neuro: Hx of depression/anxiety on fluoxetine (amytripyline on hold)  ID: Afebrile, WBC 9.7, On empiric cipro for UTI   Cipro 5/30>> Urine Cx 5/30: 55,000 colonies, Mx spp.  Best Practices: Lovenox, po ppi, home meds resumed   Plan:  -Continue Clinimix E5/20 at 71ml/hr (goal) -IV trace elements, multivitamin, and lipids 20% MWF due to national shortage  -Increase insulin slightly in TNA  -f/u MD repletion of K (5 runs ordered) -Mg 2g IV today -f/u BMET, Mag tomorrow   Jill Side L. Illene Bolus, PharmD, BCPS Clinical Pharmacist Pager: [phone] 08/08/2011 10:02 AM

## 2011-08-08 NOTE — Progress Notes (Addendum)
10 Days Post-Op  Subjective: Patient feels better. She passed flatus 3 or 4 times but has not had a bowel movement. Denies nausea or pain. Says her abdominal distention has gone down. Ambulating in halls.  Abdominal x-rays pending this morning.  Urine culture shows 55,000 colonies, multiple species. K 3.8, WBC 9,700, glucose 173  Objective: Vital signs in last 24 hours: Temp:  [97.3 F (36.3 C)-98.3 F (36.8 C)] 97.3 F (36.3 C) (06/01 0510) Pulse Rate:  [77-96] 96  (06/01 0510) Resp:  [18-22] 18  (06/01 0510) BP: (125-170)/(65-98) 150/77 mmHg (06/01 0510) SpO2:  [96 %-99 %] 98 % (06/01 0510) Last BM Date: 08/06/11  Intake/Output from previous day: 05/31 0701 - 06/01 0700 In: 4349 [I.V.:2344; IV Piggyback:600; TPN:1405] Out: 3250 [Urine:1350; Emesis/NG output:1900] Intake/Output this shift: Total I/O In: 2760 [I.V.:1544; IV Piggyback:200; TPN:1016] Out: 2000 [Urine:1050; Emesis/NG output:950]  General appearance: looks good. No distress. Mental status normal. GI: soft. Not obviously distended. Hypoactive bowel sounds. Wounds look okay.  Lab Results:  Results for orders placed during the hospital encounter of 07/29/11 (from the past 24 hour(s))  GLUCOSE, CAPILLARY     Status: Abnormal   Collection Time   08/07/11  7:41 AM      Component Value Range   Glucose-Capillary 118 (*) 70 - 99 (mg/dL)   Comment 1 Notify RN    GLUCOSE, CAPILLARY     Status: Abnormal   Collection Time   08/07/11 11:53 AM      Component Value Range   Glucose-Capillary 127 (*) 70 - 99 (mg/dL)   Comment 1 Notify RN    GLUCOSE, CAPILLARY     Status: Abnormal   Collection Time   08/07/11  4:29 PM      Component Value Range   Glucose-Capillary 132 (*) 70 - 99 (mg/dL)  GLUCOSE, CAPILLARY     Status: Abnormal   Collection Time   08/07/11  8:14 PM      Component Value Range   Glucose-Capillary 190 (*) 70 - 99 (mg/dL)  GLUCOSE, CAPILLARY     Status: Abnormal   Collection Time   08/07/11 11:34 PM   Component Value Range   Glucose-Capillary 168 (*) 70 - 99 (mg/dL)  GLUCOSE, CAPILLARY     Status: Abnormal   Collection Time   08/08/11  3:38 AM      Component Value Range   Glucose-Capillary 157 (*) 70 - 99 (mg/dL)  PROTIME-INR     Status: Normal   Collection Time   08/08/11  4:30 AM      Component Value Range   Prothrombin Time 14.6  11.6 - 15.2 (seconds)   INR 1.12  0.00 - 1.49   CBC     Status: Normal   Collection Time   08/08/11  4:30 AM      Component Value Range   WBC 9.7  4.0 - 10.5 (K/uL)   RBC 4.06  3.87 - 5.11 (MIL/uL)   Hemoglobin 13.0  12.0 - 15.0 (g/dL)   HCT 16.1  09.6 - 04.5 (%)   MCV 92.6  78.0 - 100.0 (fL)   MCH 32.0  26.0 - 34.0 (pg)   MCHC 34.6  30.0 - 36.0 (g/dL)   RDW 40.9  81.1 - 91.4 (%)   Platelets 345  150 - 400 (K/uL)  BASIC METABOLIC PANEL     Status: Abnormal   Collection Time   08/08/11  4:30 AM      Component Value Range   Sodium  136  135 - 145 (mEq/L)   Potassium 3.8  3.5 - 5.1 (mEq/L)   Chloride 98  96 - 112 (mEq/L)   CO2 26  19 - 32 (mEq/L)   Glucose, Bld 173 (*) 70 - 99 (mg/dL)   BUN 7  6 - 23 (mg/dL)   Creatinine, Ser 1.47 (*) 0.50 - 1.10 (mg/dL)   Calcium 9.6  8.4 - 82.9 (mg/dL)   GFR calc non Af Amer >90  >90 (mL/min)   GFR calc Af Amer >90  >90 (mL/min)  PHOSPHORUS     Status: Normal   Collection Time   08/08/11  4:30 AM      Component Value Range   Phosphorus 3.4  2.3 - 4.6 (mg/dL)  MAGNESIUM     Status: Normal   Collection Time   08/08/11  4:30 AM      Component Value Range   Magnesium 1.6  1.5 - 2.5 (mg/dL)     Studies/Results: @RISRSLT24 @     . bisacodyl  10 mg Rectal Daily  . ciprofloxacin  400 mg Intravenous Q12H  . digoxin  0.125 mg Intravenous Daily  . enoxaparin (LOVENOX) injection  1.5 mg/kg Subcutaneous Q24H  . FLUoxetine  20 mg Oral Daily  . insulin aspart  0-15 Units Subcutaneous Q4H  . ketorolac  1 drop Right Eye BID  . levothyroxine  38 mcg Intravenous QAC breakfast  . lidocaine  20 mL Mouth/Throat Q4H  .  magnesium sulfate 1 - 4 g bolus IVPB  1 g Intravenous Once  . metoCLOPramide (REGLAN) injection  10 mg Intravenous Q6H  . metoprolol  5 mg Intravenous Q6H  . pantoprazole (PROTONIX) IV  40 mg Intravenous Q24H  . potassium chloride  10 mEq Intravenous Q1 Hr x 5  . potassium chloride  10 mEq Intravenous Q1 Hr x 5  . potassium chloride      . potassium chloride      . potassium chloride      . potassium chloride      . simvastatin  10 mg Oral q1800     Assessment/Plan: s/p Procedure(s): LAPAROSCOPIC VENTRAL HERNIA INSERTION OF MESH  POD #9. Abdominal distention pain and nausea have resolved with nasogastric suction. Clinically ileus has not resolved completely Continue NG suction and TNA Check abdominal films today Continue IV Reglan No narcotics Appreciate GI consultation advice from Dr. Marina Goodell  Hypokalemia. Will give more runs KCL to get potassium greater than 4.0.  Protein calorie malnutrition. Continue TNA  Leukocytosis. Resolved. Urine culture appeared contaminated. On Cipro. Defer decisions regarding antibiotics to medicine service  Atrial fibrillation. Stable. On full dose Lovenox, IV digoxin, and IV beta blocker blockers. Resume Coumadin and Cardizem when GI function normalizes.   Type 2 diabetes. Reasonably well-controlled. Continue Lantus and sliding scale insulin. Resume oral medications was GI problems resolved  Hypothyroidism. TSH normal. On IV Synthroid some hypertension. Well-controlled  Chronic anxiety and depression. Clinically doing well emotionally. On Prozac.  Somewhat deconditioned. HH PT, HHOT,  HHN requested.   LOS: 10 days     M. Derrell Lolling, M.D., [facility] General and Minimally invasive Surgery Breast and Colorectal Surgery Office:   [phone] Pager:   [phone]  08/08/2011  . .prob

## 2011-08-09 ENCOUNTER — Inpatient Hospital Stay (HOSPITAL_COMMUNITY): Payer: Medicare Other

## 2011-08-09 LAB — GLUCOSE, CAPILLARY
Glucose-Capillary: 142 mg/dL — ABNORMAL HIGH (ref 70–99)
Glucose-Capillary: 176 mg/dL — ABNORMAL HIGH (ref 70–99)
Glucose-Capillary: 177 mg/dL — ABNORMAL HIGH (ref 70–99)
Glucose-Capillary: 179 mg/dL — ABNORMAL HIGH (ref 70–99)

## 2011-08-09 LAB — BASIC METABOLIC PANEL
BUN: 8 mg/dL (ref 6–23)
CO2: 24 mEq/L (ref 19–32)
Calcium: 9.7 mg/dL (ref 8.4–10.5)
Chloride: 100 mEq/L (ref 96–112)
Creatinine, Ser: 0.44 mg/dL — ABNORMAL LOW (ref 0.50–1.10)
GFR calc Af Amer: >90 mL/min (ref >90)
GFR calc non Af Amer: >90 mL/min (ref >90)
Glucose, Bld: 157 mg/dL — ABNORMAL HIGH (ref 70–99)
Potassium: 3.9 mEq/L (ref 3.5–5.1)
Sodium: 137 mEq/L (ref 135–145)

## 2011-08-09 LAB — PROTIME-INR
INR: 1.04 (ref 0.00–1.49)
Prothrombin Time: 13.8 seconds (ref 11.6–15.2)

## 2011-08-09 LAB — MAGNESIUM: Magnesium: 1.8 mg/dL (ref 1.5–2.5)

## 2011-08-09 MED ORDER — NYSTATIN 100000 UNIT/GM EX POWD
Freq: Three times a day (TID) | CUTANEOUS | Status: DC | PRN
  Administered 2011-08-09: 23:00:00 via TOPICAL
  Filled 2011-08-09: qty 15

## 2011-08-09 MED ORDER — MAGNESIUM SULFATE 40 MG/ML IJ SOLN
2.0000 g | Freq: Once | INTRAMUSCULAR | Status: AC
  Administered 2011-08-09: 2 g via INTRAVENOUS
  Filled 2011-08-09: qty 50

## 2011-08-09 MED ORDER — INSULIN REGULAR HUMAN 100 UNIT/ML IJ SOLN
INTRAVENOUS | Status: AC
  Administered 2011-08-09: 18:00:00 via INTRAVENOUS
  Filled 2011-08-09: qty 2000

## 2011-08-09 NOTE — Progress Notes (Signed)
PARENTERAL NUTRITION CONSULT NOTE - FOLLOW UP  Pharmacy Consult for TPN Indication: adynamic Ileus s/p ventral hernia repair   Allergies  Allergen Reactions  . Gemfibrozil Swelling    REACTION: Angioedema  . Latex Other (See Comments)    Blisters where touched or applied  . Penicillins Hives    Will spread in patches all over the body.  . Adhesive (Tape) Other (See Comments)    Will blister skin where applied - do not use BAND-AIDS.  Marland Kitchen Codeine Hives    Will spread in patches all over the body.    Patient Measurements: Height: 5\' 3"  (160 cm) Weight: 193 lb 14.4 oz (87.952 kg) IBW/kg (Calculated) : 52.4  Adjusted Body Weight: 63  BMI: 34  Vital Signs: Temp: 98.4 F (36.9 C) (06/02 0630) Temp src: Oral (06/01 2140) BP: 152/88 mmHg (06/02 0630) Pulse Rate: 116  (06/02 0630) Intake/Output from previous day: 06/01 0701 - 06/02 0700 In: 4335.3 [I.V.:2455; IV Piggyback:514; TPN:1366.3] Out: 2550 [Urine:1500; Emesis/NG output:1050] Intake/Output from this shift:    Labs:  [facility] 08/09/11 0605 08/08/11 0430 08/07/11 0520  WBC -- 9.7 9.4  HGB -- 13.0 12.1  HCT -- 37.6 35.5*  PLT -- 345 297  APTT -- -- --  INR 1.04 1.12 1.45     Basename 08/09/11 0605 08/08/11 0430 08/07/11 0520  NA 137 136 134*  K 3.9 3.8 3.4*  CL 100 98 99  CO2 24 26 27   GLUCOSE 157* 173* 172*  BUN 8 7 7   CREATININE 0.44* 0.46* 0.48*  LABCREA -- -- --  CREAT24HRUR -- -- --  CALCIUM 9.7 9.6 9.5  MG 1.8 1.6 1.5  PHOS -- 3.4 3.0  PROT -- -- 6.0  ALBUMIN -- -- 2.5*  AST -- -- 19  ALT -- -- 36*  ALKPHOS -- -- 92  BILITOT -- -- 0.4  BILIDIR -- -- --  IBILI -- -- --  PREALBUMIN -- -- 13.3*  TRIG -- -- 173*  CHOLHDL -- -- --  CHOL -- -- 108   Estimated Creatinine Clearance: 75.7 ml/min (by C-G formula based on Cr of 0.44).    Basename 08/09/11 0357 08/09/11 0016 08/08/11 1951  GLUCAP 179* 177* 152*   Medications:  Scheduled:     . bisacodyl  10 mg Rectal Daily  . ciprofloxacin   400 mg Intravenous Q12H  . digoxin  0.125 mg Intravenous Daily  . enoxaparin (LOVENOX) injection  1.5 mg/kg Subcutaneous Q24H  . FLUoxetine  20 mg Oral Daily  . insulin aspart  0-15 Units Subcutaneous Q4H  . ketorolac  1 drop Right Eye BID  . levothyroxine  38 mcg Intravenous QAC breakfast  . lidocaine  20 mL Mouth/Throat Q4H  . magnesium sulfate 1 - 4 g bolus IVPB  2 g Intravenous Once  . metoCLOPramide (REGLAN) injection  10 mg Intravenous Q6H  . metoprolol  5 mg Intravenous Q6H  . pantoprazole (PROTONIX) IV  40 mg Intravenous Q24H  . potassium chloride  10 mEq Intravenous Q1 Hr x 5  . simvastatin  10 mg Oral q1800  . white petrolatum       Infusions:     . dextrose 5 % and 0.45 % NaCl with KCl 20 mEq/L 100 mL/hr at 08/08/11 2335  . fat emulsion 250 mL (08/07/11 2148)  . TPN (CLINIMIX) +/- additives    . TPN (CLINIMIX) +/- additives 65 mL/hr at 08/08/11 1827   PRN: albuterol, ketorolac, magic mouthwash, ondansetron (ZOFRAN) IV, promethazine, sodium  chloride  Insulin Requirements in the past 24 hours:  9 units; 30 units insulin / BAG TNA, moderate SSI q4h.  Current Nutrition:  Clinimix E5/20 at 37ml/hr  NPO  Nutritional Goals:  1750-1970 kCal, 62-74 grams of protein per day  Assessment: 28 yof s/p ventral hernia repair (5/22) with CT abdomin showing adynamic ileus. Patient with a history of DM and may have diabetic gastroparesis and/or gastric outlet obstruction as well. NGT placed 5/30 with significant amount of bilious drainage. TNA started 5/30. Pt with positive flatus and bowel movements. PO medications changed to IV 5/30.   GI: Post op day #11 s/p ventral hernia repair. CT abdomin showing adynamic ileus +/- gastroparesis/GOO. Pt passing flatus/BM. NGT placed 5/30 with 681ml/24hr emesis/NG output recorded.  HX GERD/gastritis on ppi. Reglan/bisacodyl on board. Endo: Hx Hypothyroidism/DM on home synthroid (IV) and TSH wnl. HbA1c 6.3. PO metformin/glipizide on hold d/t  inability to swallow. CBGs slightly greater than goal of <150  Lytes: K, Mag improved s/p repletion yesterday - still slightly less than goal of >4/2, respectively.  Corr Ca slightly elevated at 10.9 Renal: Scr stable, UOP reported as 0.7 ml/kg/hr.  Pulm: RA  Cards: Hx Afib/HTN/HLD/CHF (no recent echo), BP ok, tachycardic on IV digoxin, metoprolol, statin  (atenolol, diltiazem, lisinopril, pta po meds on hold) Hepatobil: Baseline Alk phos/LFTs WNL, Trigs ok at 173 Neuro: Hx of depression/anxiety on fluoxetine (amytripyline on hold)  ID: Afebrile, WBC 9.7-6/1, On empiric cipro for UTI   Cipro 5/30>> Urine Cx 5/30: 55,000 colonies, Mx spp.  Best Practices: Lovenox, po ppi, home meds resumed   Plan:  -Continue Clinimix E5/20 at 63ml/hr (goal)  -IV trace elements, multivitamin, and lipids 20% MWF due to national shortage  -Increase insulin slightly in TNA to 45 units/BAG -Give another Mag bolus - 2g IV x1 to achieve Mag >2 -f/u am tna labs  Isiaha Greenup L. Illene Bolus, PharmD, BCPS Clinical Pharmacist Pager: [phone] 08/09/2011 9:14 AM

## 2011-08-09 NOTE — Progress Notes (Signed)
Patient ID: [name], female   DOB: 08/17/1950, 61 y.o.   MRN: [id number] [facility] Progress Note  Subjective: Feels like a new person!  Just had a large BM, NG is clamped. Has been up walking. Abdomen feels" smaller than it has been in a very long time"  Objective:  Vital signs in last 24 hours: Temp:  [98.4 F (36.9 C)-98.8 F (37.1 C)] 98.4 F (36.9 C) (06/02 0630) Pulse Rate:  [105-126] 116  (06/02 0630) Resp:  [18] 18  (06/02 0630) BP: (138-152)/(86-95) 152/88 mmHg (06/02 0630) SpO2:  [98 %-100 %] 98 % (06/02 0630) Last BM Date: 08/08/11 General:   Alert,  Well-developed,    in NAD, Ng clamped Heart:  Regular rate and rhythm; no murmurs Pulm;clear Abdomen:  Soft, nontender and nondistended. Normal bowel sounds, without guarding, Extremities:  Without edema. Neurologic:  Alert and  oriented x4;  grossly normal neurologically. Psych:  Alert and cooperative. Normal mood and affect.  Intake/Output from previous day: 06/01 0701 - 06/02 0700 In: 4335.3 [I.V.:2455; IV Piggyback:514; TPN:1366.3] Out: 2550 [Urine:1500; Emesis/NG output:1050] Intake/Output this shift:    Lab Results:  Basename 08/08/11 0430 08/07/11 0520  WBC 9.7 9.4  HGB 13.0 12.1  HCT 37.6 35.5*  PLT 345 297   BMET  Basename 08/09/11 0605 08/08/11 0430 08/07/11 0520  NA 137 136 134*  K 3.9 3.8 3.4*  CL 100 98 99  CO2 24 26 27   GLUCOSE 157* 173* 172*  BUN 8 7 7   CREATININE 0.44* 0.46* 0.48*  CALCIUM 9.7 9.6 9.5   LFT  Basename 08/07/11 0520  PROT 6.0  ALBUMIN 2.5*  AST 19  ALT 36*  ALKPHOS 92  BILITOT 0.4  BILIDIR --  IBILI --   PT/INR  Basename 08/09/11 0605 08/08/11 0430  LABPROT 13.8 14.6  INR 1.04 1.12   Hepatitis Panel No results found for this basename: HEPBSAG,HCVAB,HEPAIGM,HEPBIGM in the last 72 hours  Assessment / Plan: #1  61 yo female with prolonged post op ileus- she is much improved. Plan as outlined per Dr Derrell Lolling GI will sign off- available if we  can help this very nice lady.   Principal Problem:  *Ventral hernia s/p laparoscopic lysis of adhesions and hernia repair with mesh 07/29/11 Active Problems:  DIABETES MELLITUS, TYPE II  Atrial fibrillation  Nonspecific (abnormal) findings on radiological and other examination of gastrointestinal tract  Partial bowel obstruction  Hypokalemia  Abdominal pain, generalized     LOS: 11 days   Amy Esterwood  08/09/2011, 11:28 AM    GI ATTENDING  Seen and examined. X-rays and laboratories reviewed. Agree with above assessment and plan. At this point, will defer ongoing management to surgery. However, we are available if needed. Thank you  Wilhemina Bonito. Eda Keys., M.D. [facility] Division of Gastroenterology

## 2011-08-09 NOTE — Progress Notes (Signed)
11 Days Post-Op  Subjective: I appreciate advice and management from internal medicine service and from Dr. Marina Goodell from [facility] GI service.  The patient states she continues to feel better. She feels stronger ambulating in the hall. She has had 2 bowel movements and is passing flatus. Almost no pain. She is hungry. She did this before.  Abdominal x-rays yesterday showed improved small bowel distention, contrast in colon and the stomach still had a fair amount of gas and fluid in it despite the NG tube, so the NG tube has been manipulated and NG drainage is  declining significantly.  Objective: Vital signs in last 24 hours: Temp:  [98.4 F (36.9 C)-98.8 F (37.1 C)] 98.4 F (36.9 C) (06/02 0630) Pulse Rate:  [105-126] 116  (06/02 0630) Resp:  [18] 18  (06/02 0630) BP: (138-152)/(86-95) 152/88 mmHg (06/02 0630) SpO2:  [98 %-100 %] 98 % (06/02 0630) Last BM Date: 08/08/11  Intake/Output from previous day: 06/01 0701 - 06/02 0700 In: 3276.5 [I.V.:1813.3; IV Piggyback:514; TPN:949.2] Out: 2100 [Urine:1500; Emesis/NG output:600] Intake/Output this shift: Total I/O In: 1330 [I.V.:858.3; TPN:471.7] Out: 900 [Urine:700; Emesis/NG output:200]  General appearance: alert. Cooperative. In no distress. In good spirits. GI: abdomen is soft, somewhat obese, not distended, not tympanitic. Wounds looked fine. No infection  bowel sounds present.  Lab Results:  Results for orders placed during the hospital encounter of 07/29/11 (from the past 24 hour(s))  GLUCOSE, CAPILLARY     Status: Abnormal   Collection Time   08/08/11  7:38 AM      Component Value Range   Glucose-Capillary 147 (*) 70 - 99 (mg/dL)   Comment 1 Notify RN    GLUCOSE, CAPILLARY     Status: Abnormal   Collection Time   08/08/11 12:25 PM      Component Value Range   Glucose-Capillary 167 (*) 70 - 99 (mg/dL)   Comment 1 Notify RN    GLUCOSE, CAPILLARY     Status: Abnormal   Collection Time   08/08/11  4:03 PM      Component Value  Range   Glucose-Capillary 162 (*) 70 - 99 (mg/dL)   Comment 1 Notify RN    GLUCOSE, CAPILLARY     Status: Abnormal   Collection Time   08/08/11  7:51 PM      Component Value Range   Glucose-Capillary 152 (*) 70 - 99 (mg/dL)  GLUCOSE, CAPILLARY     Status: Abnormal   Collection Time   08/09/11 12:16 AM      Component Value Range   Glucose-Capillary 177 (*) 70 - 99 (mg/dL)  GLUCOSE, CAPILLARY     Status: Abnormal   Collection Time   08/09/11  3:57 AM      Component Value Range   Glucose-Capillary 179 (*) 70 - 99 (mg/dL)   Comment 1 Notify RN     Comment 2 Documented in Chart       Studies/Results: @RISRSLT24 @     . bisacodyl  10 mg Rectal Daily  . ciprofloxacin  400 mg Intravenous Q12H  . digoxin  0.125 mg Intravenous Daily  . enoxaparin (LOVENOX) injection  1.5 mg/kg Subcutaneous Q24H  . FLUoxetine  20 mg Oral Daily  . insulin aspart  0-15 Units Subcutaneous Q4H  . ketorolac  1 drop Right Eye BID  . levothyroxine  38 mcg Intravenous QAC breakfast  . lidocaine  20 mL Mouth/Throat Q4H  . magnesium sulfate 1 - 4 g bolus IVPB  2 g Intravenous Once  .  metoCLOPramide (REGLAN) injection  10 mg Intravenous Q6H  . metoprolol  5 mg Intravenous Q6H  . pantoprazole (PROTONIX) IV  40 mg Intravenous Q24H  . potassium chloride  10 mEq Intravenous Q1 Hr x 5  . simvastatin  10 mg Oral q1800  . white petrolatum         Assessment/Plan: s/p Procedure(s): LAPAROSCOPIC VENTRAL HERNIA INSERTION OF MESH  POD #10.  Abdominal distention pain and nausea have resolved with nasogastric suction. Clinically ileus resolving. Clinically, she behaved this way before, and then became distended again. Will be cautious and go slow with progressing diet. Clamp the NG tube intermittently and try clear liquid diet.  Check abdominal films today  Continue IV Reglan  No narcotics  Ambulate more-encouraged.   Hypokalemia. Labs pending this morning. Check potassium level and supplemented as necessary to  keep potassium greater than 4.0.  Protein calorie malnutrition. Continue TNA  Leukocytosis. Resolved. Urine culture appeared contaminated. On Cipro. Defer decisions regarding antibiotics to medicine service   Atrial fibrillation. Stable. On full dose Lovenox, IV digoxin, and IV beta blocker blockers. Resume Coumadin and Cardizem once back on diet.  Type 2 diabetes. Reasonably well-controlled. Continue Lantus and sliding scale insulin. Resume oral medications was GI problems resolved   Hypothyroidism. TSH normal. On IV Synthroid some hypertension. Well-controlled   Chronic anxiety and depression. Clinically doing well emotionally. On Prozac.   Somewhat deconditioned. HH PT, HHOT, HHN requested.    LOS: 11 days    Rahmel Nedved M. Derrell Lolling, M.D., [facility] General and Minimally invasive Surgery Breast and Colorectal Surgery Office:   [phone] Pager:   [phone]  08/09/2011  . .prob

## 2011-08-09 NOTE — Progress Notes (Signed)
Subjective: Patient states that she feels ok. No nausea, vomiting or abdominal pain.  Patient endorses passing flatulence and had BM. Patient ambulates daily. Her ileus is clinically improving.  NG tube is clamped this morning. Patient ate a clear liquid breakfast without any problems.   No acute events overnight.  Remains afebrile   Objective: Vital signs in last 24 hours: Filed Vitals:   08/08/11 0510 08/08/11 1415 08/08/11 2140 08/09/11 0630  BP: 150/77 138/86 147/95 152/88  Pulse: 96 105 126 116  Temp: 97.3 F (36.3 C) 98.8 F (37.1 C) 98.5 F (36.9 C) 98.4 F (36.9 C)  TempSrc: Oral Oral Oral   Resp: 18 18 18 18   Height:      Weight:      SpO2: 98% 100% 100% 98%   Weight change:   Intake/Output Summary (Last 24 hours) at 08/09/11 1133 Last data filed at 08/09/11 0700  Gross per 24 hour  Intake 4335.25 ml  Output   2250 ml  Net 2085.25 ml   General: NAD. NGT in place  Cardiac: irregularly irregular rhythm, no M/G/R  Pulm: CTA B/L  Abd: Soft, active BS x 4. Mild tenderness to palpation, mildly distended. Multiple laparoscopic incisions healing well.  Ext: warm and well perfused, no pedal edema. No tenderness or swelling. No erythema.  Neuro: alert and oriented X3, cranial nerves II-XII grossly intact  Lab Results: Basic Metabolic Panel:  Lab 08/09/11 1610 08/08/11 0430 08/07/11 0520  Cerina Leary 137 136 --  K 3.9 3.8 --  CL 100 98 --  CO2 24 26 --  GLUCOSE 157* 173* --  BUN 8 7 --  CREATININE 0.44* 0.46* --  CALCIUM 9.7 9.6 --  MG 1.8 1.6 --  PHOS -- 3.4 3.0   Liver Function Tests:  Lab 08/07/11 0520 08/05/11 0626  AST 19 15  ALT 36* 15  ALKPHOS 92 90  BILITOT 0.4 1.0  PROT 6.0 7.1  ALBUMIN 2.5* 3.0*   CBC:  Lab 08/08/11 0430 08/07/11 0520 08/05/11 0626  WBC 9.7 9.4 --  NEUTROABS -- 6.0 14.1*  HGB 13.0 12.1 --  HCT 37.6 35.5* --  MCV 92.6 93.7 --  PLT 345 297 --   CBG:  Lab 08/09/11 0357 08/09/11 0016 08/08/11 1951 08/08/11 1603 08/08/11 1225  08/08/11 0738  GLUCAP 179* 177* 152* 162* 167* 147*   Fasting Lipid Panel:  Lab 08/07/11 0520  CHOL 108  HDL --  LDLCALC --  TRIG 173*  CHOLHDL --  LDLDIRECT --   Coagulation:  Lab 08/09/11 0605 08/08/11 0430 08/07/11 0520 08/06/11 0630  LABPROT 13.8 14.6 17.9* 17.0*  INR 1.04 1.12 1.45 1.36   Urinalysis:  Lab 08/06/11 0900  COLORURINE AMBER*  LABSPEC 1.031*  PHURINE 5.5  GLUCOSEU NEGATIVE  HGBUR NEGATIVE  BILIRUBINUR SMALL*  KETONESUR NEGATIVE  PROTEINUR 30*  UROBILINOGEN 1.0  NITRITE NEGATIVE  LEUKOCYTESUR SMALL*    Micro Results: Recent Results (from the past 240 hour(s))  URINE CULTURE     Status: Normal   Collection Time   08/06/11  9:00 AM      Component Value Range Status Comment   Specimen Description URINE, CLEAN CATCH   Final    Special Requests NONE   Final    Culture  Setup Time [id number]   Final    Colony Count 55,000 COLONIES/ML   Final    Culture     Final    Value: Multiple bacterial morphotypes present, none predominant. Suggest appropriate recollection if clinically indicated.  Report Status 08/07/2011 FINAL   Final    Studies/Results: Dg Abd 2 Views  08-28-2011  *RADIOLOGY REPORT*  Clinical Data: Small bowel obstruction versus ileus  ABDOMEN - 2 VIEW  Comparison: 08/08/2011  Findings: Cholecystectomy clips noted within the right upper quadrant the abdomen.  There is a nasogastric tube which is coiled in the stomach.  Interval decompression of the gastric lumen and improvement in small bowel distention.    Enteric contrast material is again noted within the right colon.  IMPRESSION:  1. Improving small bowel obstruction pattern.  Original Report Authenticated By: Rosealee Albee, M.D.   Dg Abd 2 Views  08/08/2011  *RADIOLOGY REPORT*  Clinical Data: Postoperative ileus.  Gastric distention.  ABDOMEN - 2 VIEW  Comparison: CT abdomen pelvis 08/06/2011.  Plain films the abdomen 08/04/2011 and 08/06/2011.  Findings: NG tube is in place.  Distention  of the stomach persists but appears improved.  There is persistent gaseous distention of small bowel loops measuring up to 6.1 cm, slightly improved. Contrast material from the patient's CT scan is now visualized in the colon.  No free intraperitoneal air is identified.  IMPRESSION:  1.  Mild improvement in small bowel dilatation compatible with improved ileus or obstruction.  Contrast material is now seen the colon. 2.  Some improvement gaseous distention of the stomach. 3.  No free peritoneal air.  Original Report Authenticated By: Bernadene Bell. Maricela Curet, M.D.   Medications: I have reviewed the patient's current medications. Scheduled Meds:   . bisacodyl  10 mg Rectal Daily  . ciprofloxacin  400 mg Intravenous Q12H  . digoxin  0.125 mg Intravenous Daily  . enoxaparin (LOVENOX) injection  1.5 mg/kg Subcutaneous Q24H  . FLUoxetine  20 mg Oral Daily  . insulin aspart  0-15 Units Subcutaneous Q4H  . ketorolac  1 drop Right Eye BID  . levothyroxine  38 mcg Intravenous QAC breakfast  . lidocaine  20 mL Mouth/Throat Q4H  . magnesium sulfate 1 - 4 g bolus IVPB  2 g Intravenous Once  . magnesium sulfate 1 - 4 g bolus IVPB  2 g Intravenous Once  . metoCLOPramide (REGLAN) injection  10 mg Intravenous Q6H  . metoprolol  5 mg Intravenous Q6H  . pantoprazole (PROTONIX) IV  40 mg Intravenous Q24H  . potassium chloride  10 mEq Intravenous Q1 Hr x 5  . simvastatin  10 mg Oral q1800  . white petrolatum       Continuous Infusions:   . dextrose 5 % and 0.45 % NaCl with KCl 20 mEq/L 100 mL/hr at 08/08/11 2335  . fat emulsion 250 mL (08/07/11 2148)  . TPN (CLINIMIX) +/- additives    . TPN (CLINIMIX) +/- additives 65 mL/hr at 08/08/11 1827  . TPN (CLINIMIX) +/- additives     PRN Meds:.albuterol, ketorolac, magic mouthwash, ondansetron (ZOFRAN) IV, promethazine, sodium chloride Assessment/Plan:  # Post Op ileus:  Ventral hernia s/p laparoscopic lysis of adhesions and hernia repair with mesh 07/29/11:  Patient had low clinical response with postop ileus. NGT was removed and reinserted due to significant N/V. Abd CT showed focal post op ileus.>>>better now>>>NG clamped today.   -CL and clamping NG per Surgical service >>>once stable, likely tomorrow, will switch all meds back to PO. - TPN  - All home regimen switched to IV is possible  - hold gabapentin and amitriptyline for now since on narcotics for pain. Restart these on discharge.   # Hypomagnesemia and hypokalemia  - replaced by CCS  -  K 3.9 -Mg 1.9>>replaced - follow up BMP and Mg on Monday  # Leukocytosis:resolved  Patient Afebrile.  Most likely causes are persistent ileus / postoperative state vs possible UTI.  No evidence of PNA (CXR), DVT (No S/S on exam and on full dose Lovenox) and Incision (No S/S).   -UA Mild pyuria, rare bacteria, negative nitrates  -Urine Cx>>>indicated non clean catch.  - will continue cipro for short course for presumed UTI.>>>7 days  Afib: stable, rates ~100 bpm at goal on atenolol 25mg  PO BID, Diltiazem XR 180mg  PO daily, Digoxin 0.25 mg daily.  - full dose lovenox  - Restart warfarin once tolerating PO.  - Digoxin 0.125mg  IV daily while NPO with NGT  - metoprolol 5mg  IV Q6H and hold PO atenolol and PO Diltiazem XR   DM2: Well controlled. A1c- 6.3. CBGs look good in recent days.  - Continue Lantus at 10 units and SSI>>>CBG good control  - Hold oral medications while NGT   Hypothyroidism: TSH- 0.756. Normal.  -Will convert synthroid to IV while NGT in place.  -Restart home PO synthroid dose when tolerating PO   Hypertension: BP up an down but control ok overall.  -Hold PO meds (Lisinopril, Cardizem, and Atenolol).  -Start metoprolol 5mg  IV Q6H in place of these medications for rate control and blood pressure  -can resume home regimen once tolerating POs   Hx of TIA/CVA: Unclear when pt had it. She refuses any Hx though.  In any circumstances, as her RICA is 40-59% stenosed, risk of  bleeding from adding ASA exceeds the benefit, on top of coumadin for Afib. This was discussed by Dr. Dierdre Searles with Dr. Pearlean Brownie with Neurology.  -Lovenox full dose in place of warfarin while NPO. Restart warfarin when she again gets back to POs.   Depression: Continue PO Prozac if she can swallow it to avoid SSRI withdrawal   GERD: Will convert Protonix to IV while NPO with NGT.  DVT Prophylaxis: Lovenox        LOS: 11 days   Gustavo Meditz 08/09/2011, 11:33 AM

## 2011-08-10 LAB — DIFFERENTIAL
Basophils Absolute: 0.1 10*3/uL (ref 0.0–0.1)
Basophils Relative: 0 % (ref 0–1)
Eosinophils Absolute: 0.6 10*3/uL (ref 0.0–0.7)
Eosinophils Relative: 4 % (ref 0–5)
Lymphocytes Relative: 19 % (ref 12–46)
Lymphs Abs: 2.6 10*3/uL (ref 0.7–4.0)
Monocytes Relative: 7 % (ref 3–12)
Neutrophils Relative %: 69 % (ref 43–77)

## 2011-08-10 LAB — TRIGLYCERIDES: Triglycerides: 199 mg/dL — ABNORMAL HIGH (ref <150)

## 2011-08-10 LAB — CBC
HCT: 39.4 % (ref 36.0–46.0)
MCH: 31.7 pg (ref 26.0–34.0)
MCHC: 33.8 g/dL (ref 30.0–36.0)
MCV: 93.8 fL (ref 78.0–100.0)
Platelets: 397 10*3/uL (ref 150–400)
RDW: 12.4 % (ref 11.5–15.5)
WBC: 13.5 10*3/uL — ABNORMAL HIGH (ref 4.0–10.5)

## 2011-08-10 LAB — COMPREHENSIVE METABOLIC PANEL
ALT: 30 U/L (ref 0–35)
AST: 25 U/L (ref 0–37)
CO2: 26 mEq/L (ref 19–32)
Calcium: 9.5 mg/dL (ref 8.4–10.5)
Creatinine, Ser: 0.49 mg/dL — ABNORMAL LOW (ref 0.50–1.10)
GFR calc Af Amer: >90 mL/min (ref >90)
GFR calc non Af Amer: >90 mL/min (ref >90)
Glucose, Bld: 173 mg/dL — ABNORMAL HIGH (ref 70–99)
Potassium: 3.8 mEq/L (ref 3.5–5.1)

## 2011-08-10 LAB — WET PREP, GENITAL
Trich, Wet Prep: NONE SEEN
Yeast Wet Prep HPF POC: NONE SEEN

## 2011-08-10 LAB — GLUCOSE, CAPILLARY
Glucose-Capillary: 138 mg/dL — ABNORMAL HIGH (ref 70–99)
Glucose-Capillary: 163 mg/dL — ABNORMAL HIGH (ref 70–99)
Glucose-Capillary: 175 mg/dL — ABNORMAL HIGH (ref 70–99)
Glucose-Capillary: 190 mg/dL — ABNORMAL HIGH (ref 70–99)
Glucose-Capillary: 210 mg/dL — ABNORMAL HIGH (ref 70–99)
Glucose-Capillary: 210 mg/dL — ABNORMAL HIGH (ref 70–99)

## 2011-08-10 LAB — PREALBUMIN: Prealbumin: 19.8 mg/dL (ref 17.0–34.0)

## 2011-08-10 LAB — CHOLESTEROL, TOTAL: Cholesterol: 112 mg/dL (ref 0–200)

## 2011-08-10 LAB — PHOSPHORUS: Phosphorus: 3.5 mg/dL (ref 2.3–4.6)

## 2011-08-10 LAB — MAGNESIUM: Magnesium: 1.8 mg/dL (ref 1.5–2.5)

## 2011-08-10 MED ORDER — ADULT MULTIVITAMIN W/MINERALS CH
1.0000 | ORAL_TABLET | Freq: Every day | ORAL | Status: DC
  Administered 2011-08-10 – 2011-08-12 (×2): 1 via ORAL
  Filled 2011-08-10 (×3): qty 1

## 2011-08-10 MED ORDER — DIGOXIN 250 MCG PO TABS
0.2500 mg | ORAL_TABLET | Freq: Every day | ORAL | Status: DC
  Administered 2011-08-10: 0.25 mg via ORAL
  Filled 2011-08-10 (×2): qty 1

## 2011-08-10 MED ORDER — LEVOTHYROXINE SODIUM 75 MCG PO TABS
75.0000 ug | ORAL_TABLET | Freq: Every day | ORAL | Status: DC
  Filled 2011-08-10 (×2): qty 1

## 2011-08-10 MED ORDER — FLUCONAZOLE 150 MG PO TABS
150.0000 mg | ORAL_TABLET | Freq: Once | ORAL | Status: AC
  Administered 2011-08-10: 150 mg via ORAL
  Filled 2011-08-10 (×3): qty 1

## 2011-08-10 MED ORDER — POTASSIUM CHLORIDE 10 MEQ/50ML IV SOLN
10.0000 meq | INTRAVENOUS | Status: AC
  Administered 2011-08-10 (×4): 10 meq via INTRAVENOUS
  Filled 2011-08-10 (×4): qty 50

## 2011-08-10 MED ORDER — DILTIAZEM HCL ER COATED BEADS 180 MG PO CP24
180.0000 mg | ORAL_CAPSULE | Freq: Every day | ORAL | Status: DC
  Administered 2011-08-10: 180 mg via ORAL
  Filled 2011-08-10 (×2): qty 1

## 2011-08-10 MED ORDER — WARFARIN SODIUM 5 MG PO TABS
5.0000 mg | ORAL_TABLET | Freq: Once | ORAL | Status: AC
  Administered 2011-08-10: 5 mg via ORAL
  Filled 2011-08-10: qty 1

## 2011-08-10 MED ORDER — INSULIN REGULAR HUMAN 100 UNIT/ML IJ SOLN
INTRAMUSCULAR | Status: AC
  Administered 2011-08-10: 18:00:00 via INTRAVENOUS
  Filled 2011-08-10: qty 2000

## 2011-08-10 MED ORDER — WARFARIN - PHARMACIST DOSING INPATIENT: Freq: Every day | Status: DC

## 2011-08-10 MED ORDER — FAT EMULSION 20 % IV EMUL
250.0000 mL | INTRAVENOUS | Status: AC
  Administered 2011-08-10: 250 mL via INTRAVENOUS
  Filled 2011-08-10: qty 250

## 2011-08-10 MED ORDER — MAGNESIUM SULFATE 50 % IJ SOLN
2.0000 g | Freq: Once | INTRAVENOUS | Status: AC
  Administered 2011-08-10: 2 g via INTRAVENOUS
  Filled 2011-08-10 (×2): qty 4

## 2011-08-10 MED ORDER — ATENOLOL 25 MG PO TABS
25.0000 mg | ORAL_TABLET | Freq: Two times a day (BID) | ORAL | Status: DC
  Administered 2011-08-10 (×2): 25 mg via ORAL
  Filled 2011-08-10 (×4): qty 1

## 2011-08-10 NOTE — Progress Notes (Addendum)
Subjective: Patient states that she feels ok. No nausea, vomiting or abdominal pain.  Patient endorses passing flatulence and had one BM this morning. Patient ambulates daily.  Her ileus is clinically improving. NG tube is clamped yesterday. She tolerated CL well and advanced Full liquid by CCS this morning.  No acute events overnight. Remains afebrile  Objective: Vital signs in last 24 hours: Filed Vitals:   08/09/11 0630 08/09/11 1406 08/09/11 2112 08/10/11 0626  BP: 152/88 138/75 145/90 146/77  Pulse: 116 107 120 101  Temp: 98.4 F (36.9 C) 99.3 F (37.4 C) 98.4 F (36.9 C) 97.9 F (36.6 C)  TempSrc:  Oral Oral   Resp: 18 18 18 18   Height:      Weight:      SpO2: 98% 100% 100% 98%   Weight change:   Intake/Output Summary (Last 24 hours) at 08/10/11 0812 Last data filed at 08/10/11 0600  Gross per 24 hour  Intake   4920 ml  Output   3550 ml  Net   1370 ml   Physical exam General: NAD. NGT in place  Cardiac: irregularly irregular rhythm, no M/G/R  Pulm: CTA B/L  Abd: Soft, active BS x 4. Non tender. nondistension. Multiple laparoscopic incisions healing well.  Ext: warm and well perfused, no pedal edema. No tenderness or swelling. No erythema.  Neuro: alert and oriented X3, cranial nerves II-XII grossly intact     Lab Results: Basic Metabolic Panel:  Lab 08/10/11 1610 08/09/11 0605 08/08/11 0430  NA 135 137 --  K 3.8 3.9 --  CL 98 100 --  CO2 26 24 --  GLUCOSE 173* 157* --  BUN 8 8 --  CREATININE 0.49* 0.44* --  CALCIUM 9.5 9.7 --  MG 1.8 1.8 --  PHOS 3.5 -- 3.4   Liver Function Tests:  Lab 08/10/11 0603 08/07/11 0520  AST 25 19  ALT 30 36*  ALKPHOS 93 92  BILITOT 0.5 0.4  PROT 6.7 6.0  ALBUMIN 2.7* 2.5*   CBC:  Lab 08/10/11 0603 08/08/11 0430 08/07/11 0520  WBC 13.5* 9.7 --  NEUTROABS 9.4* -- 6.0  HGB 13.3 13.0 --  HCT 39.4 37.6 --  MCV 93.8 92.6 --  PLT 397 345 --   CBG:  Lab 08/10/11 0421 08/10/11 08/09/11 2019 08/09/11 1657 08/09/11  1151 08/09/11 0747  GLUCAP 190* 210* 142* 195* 176* 160*   Fasting Lipid Panel:  Lab 08/10/11 0603  CHOL 112  HDL --  LDLCALC --  TRIG 199*  CHOLHDL --  LDLDIRECT --   Coagulation:  Lab 08/10/11 0603 08/09/11 0605 08/08/11 0430 08/07/11 0520  LABPROT 13.6 13.8 14.6 17.9*  INR 1.02 1.04 1.12 1.45   Urinalysis:  Lab 08/06/11 0900  COLORURINE AMBER*  LABSPEC 1.031*  PHURINE 5.5  GLUCOSEU NEGATIVE  HGBUR NEGATIVE  BILIRUBINUR SMALL*  KETONESUR NEGATIVE  PROTEINUR 30*  UROBILINOGEN 1.0  NITRITE NEGATIVE  LEUKOCYTESUR SMALL*   Micro Results: Recent Results (from the past 240 hour(s))  URINE CULTURE     Status: Normal   Collection Time   08/06/11  9:00 AM      Component Value Range Status Comment   Specimen Description URINE, CLEAN CATCH   Final    Special Requests NONE   Final    Culture  Setup Time [id number]   Final    Colony Count 55,000 COLONIES/ML   Final    Culture     Final    Value: Multiple bacterial morphotypes present, none predominant.  Suggest appropriate recollection if clinically indicated.   Report Status 08/07/2011 FINAL   Final    Studies/Results: Dg Abd 2 Views  Aug 20, 2011  *RADIOLOGY REPORT*  Clinical Data: Small bowel obstruction versus ileus  ABDOMEN - 2 VIEW  Comparison: 08/08/2011  Findings: Cholecystectomy clips noted within the right upper quadrant the abdomen.  There is a nasogastric tube which is coiled in the stomach.  Interval decompression of the gastric lumen and improvement in small bowel distention.    Enteric contrast material is again noted within the right colon.  IMPRESSION:  1. Improving small bowel obstruction pattern.  Original Report Authenticated By: Rosealee Albee, M.D.   Medications: I have reviewed the patient's current medications. Scheduled Meds:   . atenolol  25 mg Oral BID  . bisacodyl  10 mg Rectal Daily  . digoxin  0.25 mg Oral Daily  . diltiazem  180 mg Oral Daily  . enoxaparin (LOVENOX) injection  1.5 mg/kg  Subcutaneous Q24H  . FLUoxetine  20 mg Oral Daily  . insulin aspart  0-15 Units Subcutaneous Q4H  . ketorolac  1 drop Right Eye BID  . levothyroxine  75 mcg Oral QAC breakfast  . lidocaine  20 mL Mouth/Throat Q4H  . magnesium sulfate LVP 250-500 ml  2 g Intravenous Once  . magnesium sulfate 1 - 4 g bolus IVPB  2 g Intravenous Once  . metoCLOPramide (REGLAN) injection  10 mg Intravenous Q6H  . pantoprazole (PROTONIX) IV  40 mg Intravenous Q24H  . potassium chloride  10 mEq Intravenous Q1 Hr x 4  . simvastatin  10 mg Oral q1800  . white petrolatum      . DISCONTD: ciprofloxacin  400 mg Intravenous Q12H  . DISCONTD: digoxin  0.125 mg Intravenous Daily  . DISCONTD: levothyroxine  38 mcg Intravenous QAC breakfast  . DISCONTD: metoprolol  5 mg Intravenous Q6H   Continuous Infusions:   . dextrose 5 % and 0.45 % NaCl with KCl 20 mEq/L 100 mL/hr at 08/10/11 0008  . TPN (CLINIMIX) +/- additives 65 mL/hr at 08/08/11 1827  . TPN (CLINIMIX) +/- additives 65 mL/hr at 2011/08/20 1810   PRN Meds:.albuterol, ketorolac, magic mouthwash, nystatin, ondansetron (ZOFRAN) IV, promethazine, sodium chloride Assessment/Plan:   # Post Op ileus:  Ventral hernia s/p laparoscopic lysis of adhesions and hernia repair with mesh 07/29/11: Patient had low clinical response with postop ileus. NGT was removed and reinserted due to significant N/V. Abd CT showed focal post op ileus.>>>better now>>>NG clamped and tolerated well.  -FL and clamping NG per Surgical service >>> will switch all meds back to PO.  - TPN per CCS - hold gabapentin and amitriptyline for now since on narcotics for pain. Restart these on discharge.   # Vaginal itching No discharge.  - wet prep genital and vaginal today.  - diflucan 150 mg po x1  # Hypomagnesemia and hypokalemia  - replaced by CCS  - K 3.8 and Mg 1.8 - Mg eplaced this morning - follow up BMP and Mg in am  # Leukocytosis:resolved and again 13.5 this am  Patient Afebrile.  No apparent source of infection. No evidence of PNA (CXR), DVT (No S/S on exam and on full dose Lovenox) and Incision (No S/S) ? De margination vs ileus / postoperative state vs possible UTI.   -UA Mild pyuria, rare bacteria, negative nitrates  -Urine Cx>>>indicated non clean catch.  - cipro for short course for presumed UTI>>>D/C'd  Afib: Stable, rates ~100 bpm at goal  on atenolol 25mg  PO BID, Diltiazem XR 180mg  PO daily, Digoxin 0.25 mg daily.  - full dose lovenox  - resume above oral medications. - coumadin per pharmacy today  DM2: Well controlled. A1c- 6.3.  -on TPN.  - Continue SSI>>>CBG good control  - Hold oral medications for now - likely resume it in am  Hypothyroidism: TSH- 0.756. Normal.  .  -Restart home PO synthroid dose  Hypertension: BP up an down but control ok overall.   -can resume home regimen  -hold Lisinopril for now  Hx of TIA/CVA: Unclear when pt had it. She refuses any Hx though.  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 This was discussed by Dr. Dierdre Searles with Dr. Pearlean Brownie with Neurology.  -Lovenox full dose in place of warfarin while NPO. Restart warfarin when she again gets back to POs.   Depression: Continue PO Prozac if she can swallow it to avoid SSRI withdrawal   GERD: Will convert Protonix to IV while NPO with NGT.   DVT Prophylaxis: Lovenox     LOS: 12 days   LI, NA 08/10/2011, 8:12 AM  Internal Medicine Teaching Service Attending Note Date: 08/10/2011  Patient name: [name]  Medical record number: [id number]  Date of birth: Jul 20, 1950    This patient has been seen and discussed with the house staff. Please see their note for complete details. I concur with their findings with the following additions/corrections: 61 year old with postoperative ileus, on TPN, NG tube clamped and CCS following closely  She has a yeast infection due to the cipro  which we will treat with fluconazole.  Her atrial fibrillation appears to be rate controlled on 3 medicines chronically with beta blocker, cardizem and digoxin. She is currently being bridged with lovenox.  Paulette Blanch Dam 08/10/2011, 12:02 PM

## 2011-08-10 NOTE — Progress Notes (Signed)
Pt has had a good day.  No complaints of nausea or abd pain.  She has had 2 stools that were brown, semi-soft, and a medium amount.  She continues to pass flatus.  NG has been clamped all day, as ordered.  Pt is just having a lot of difficulty swallowing anything with her NG tube in place.  We've even tried breaking and crushing certain PO meds and she states that she almost starts feeling sick just because she can't get them down.  Will continue to monitor.

## 2011-08-10 NOTE — Progress Notes (Signed)
Nutrition Follow-up  Diet Order:  Full liquids  Pt seen prior to lunch.  She states she had a full liquid which was well tolerated.  She is anticipating NGT removal is diet advancement continues to go well.  Pt continues Clinimix 5/15 @ 65 mL/hr with 20% IV lipids @ 10 mL/hr providing 1313 kcal, 78g protein meeting 75% kcal needs, 100% estimated protein needs.  Meds: Scheduled Meds:   . atenolol  25 mg Oral BID  . bisacodyl  10 mg Rectal Daily  . digoxin  0.25 mg Oral Daily  . diltiazem  180 mg Oral Daily  . enoxaparin (LOVENOX) injection  1.5 mg/kg Subcutaneous Q24H  . fluconazole  150 mg Oral Once  . FLUoxetine  20 mg Oral Daily  . insulin aspart  0-15 Units Subcutaneous Q4H  . ketorolac  1 drop Right Eye BID  . levothyroxine  75 mcg Oral QAC breakfast  . lidocaine  20 mL Mouth/Throat Q4H  . magnesium sulfate LVP 250-500 ml  2 g Intravenous Once  . metoCLOPramide (REGLAN) injection  10 mg Intravenous Q6H  . mulitivitamin with minerals  1 tablet Oral Daily  . pantoprazole (PROTONIX) IV  40 mg Intravenous Q24H  . potassium chloride  10 mEq Intravenous Q1 Hr x 4  . simvastatin  10 mg Oral q1800  . DISCONTD: ciprofloxacin  400 mg Intravenous Q12H  . DISCONTD: digoxin  0.125 mg Intravenous Daily  . DISCONTD: levothyroxine  38 mcg Intravenous QAC breakfast  . DISCONTD: metoprolol  5 mg Intravenous Q6H   Continuous Infusions:   . dextrose 5 % and 0.45 % NaCl with KCl 20 mEq/L 100 mL/hr at 08/10/11 0008  . fat emulsion    . TPN (CLINIMIX) +/- additives 65 mL/hr at 08/08/11 1827  . TPN (CLINIMIX) +/- additives 65 mL/hr at 08/09/11 1810  . TPN (CLINIMIX) +/- additives     PRN Meds:.albuterol, ketorolac, magic mouthwash, nystatin, ondansetron (ZOFRAN) IV, promethazine, sodium chloride  Labs:  CMP     Component Value Date/Time   NA 135 08/10/2011 0603   K 3.8 08/10/2011 0603   CL 98 08/10/2011 0603   CO2 26 08/10/2011 0603   GLUCOSE 173* 08/10/2011 0603   BUN 8 08/10/2011 0603   CREATININE 0.49* 08/10/2011 0603   CREATININE 0.75 05/08/2011 1547   CALCIUM 9.5 08/10/2011 0603   PROT 6.7 08/10/2011 0603   ALBUMIN 2.7* 08/10/2011 0603   AST 25 08/10/2011 0603   ALT 30 08/10/2011 0603   ALKPHOS 93 08/10/2011 0603   BILITOT 0.5 08/10/2011 0603   GFRNONAA >90 08/10/2011 0603   GFRAA >90 08/10/2011 0603     Intake/Output Summary (Last 24 hours) at 08/10/11 1351 Last data filed at 08/10/11 0930  Gross per 24 hour  Intake   4680 ml  Output   3700 ml  Net    980 ml    Weight Status:  No new wt.  Restatement of needs:  1750-1970 kcal, 62-74 g protein  Nutrition Dx:  Inadequate oral intake, ongoing  Intervention:   1. Modify diet; continued advancement per MD discretion to Mechanical soft, goal. 2.  Parenteral nutrition; continued management per PharmD.  If pt unable to continue with diet advancement as anticipated, consider increasing regimen to meet >90% of estimated kcal needs while exceeding protein needs.  Monitor:   1. Food/Beverage; diet advancement per MD discretion. Met, continue 2. Parenteral nutrition; initiation with tolerance. Met, continue   Hoyt Koch Pager #:  [phone]

## 2011-08-10 NOTE — Progress Notes (Signed)
PARENTERAL NUTRITION CONSULT NOTE - FOLLOW UP  Pharmacy Consult for TPN Indication: adynamic Ileus s/p ventral hernia repair   Allergies  Allergen Reactions  . Gemfibrozil Swelling    REACTION: Angioedema  . Latex Other (See Comments)    Blisters where touched or applied  . Penicillins Hives    Will spread in patches all over the body.  . Adhesive (Tape) Other (See Comments)    Will blister skin where applied - do not use BAND-AIDS.  Marland Kitchen Codeine Hives    Will spread in patches all over the body.    Patient Measurements: Height: 5\' 3"  (160 cm) Weight: 193 lb 14.4 oz (87.952 kg) IBW/kg (Calculated) : 52.4  Adjusted Body Weight: 63  BMI: 34  Vital Signs: Temp: 97.9 F (36.6 C) (06/03 0626) BP: 134/82 mmHg (06/03 1017) Pulse Rate: 101  (06/03 0626) Intake/Output from previous day: 06/02 0701 - 06/03 0700 In: 4920 [P.O.:960; I.V.:2400; TPN:1560] Out: 3550 [Urine:3000; Emesis/NG output:550] Intake/Output from this shift: Total I/O In: 120 [P.O.:120] Out: 450 [Urine:450]  Labs:  [facility] 08/10/11 0603 08/09/11 0605 08/08/11 0430  WBC 13.5* -- 9.7  HGB 13.3 -- 13.0  HCT 39.4 -- 37.6  PLT 397 -- 345  APTT -- -- --  INR 1.02 1.04 1.12     Basename 08/10/11 0603 08/09/11 0605 08/08/11 0430  NA 135 137 136  K 3.8 3.9 3.8  CL 98 100 98  CO2 26 24 26   GLUCOSE 173* 157* 173*  BUN 8 8 7   CREATININE 0.49* 0.44* 0.46*  LABCREA -- -- --  CREAT24HRUR -- -- --  CALCIUM 9.5 9.7 9.6  MG 1.8 1.8 1.6  PHOS 3.5 -- 3.4  PROT 6.7 -- --  ALBUMIN 2.7* -- --  AST 25 -- --  ALT 30 -- --  ALKPHOS 93 -- --  BILITOT 0.5 -- --  BILIDIR -- -- --  IBILI -- -- --  PREALBUMIN -- -- --  TRIG 199* -- --  CHOLHDL -- -- --  CHOL 112 -- --   Estimated Creatinine Clearance: 75.7 ml/min (by C-G formula based on Cr of 0.49).    Basename 08/10/11 1204 08/10/11 0748 08/10/11 0421  GLUCAP 138* 163* 190*   Medications:  Scheduled:     . atenolol  25 mg Oral BID  . bisacodyl  10 mg  Rectal Daily  . digoxin  0.25 mg Oral Daily  . diltiazem  180 mg Oral Daily  . enoxaparin (LOVENOX) injection  1.5 mg/kg Subcutaneous Q24H  . fluconazole  150 mg Oral Once  . FLUoxetine  20 mg Oral Daily  . insulin aspart  0-15 Units Subcutaneous Q4H  . ketorolac  1 drop Right Eye BID  . levothyroxine  75 mcg Oral QAC breakfast  . lidocaine  20 mL Mouth/Throat Q4H  . magnesium sulfate LVP 250-500 ml  2 g Intravenous Once  . metoCLOPramide (REGLAN) injection  10 mg Intravenous Q6H  . pantoprazole (PROTONIX) IV  40 mg Intravenous Q24H  . potassium chloride  10 mEq Intravenous Q1 Hr x 4  . simvastatin  10 mg Oral q1800  . DISCONTD: ciprofloxacin  400 mg Intravenous Q12H  . DISCONTD: digoxin  0.125 mg Intravenous Daily  . DISCONTD: levothyroxine  38 mcg Intravenous QAC breakfast  . DISCONTD: metoprolol  5 mg Intravenous Q6H   Infusions:     . dextrose 5 % and 0.45 % NaCl with KCl 20 mEq/L 100 mL/hr at 08/10/11 0008  . TPN (CLINIMIX) +/-  additives 65 mL/hr at 08/08/11 1827  . TPN (CLINIMIX) +/- additives 65 mL/hr at 08/09/11 1810   PRN: albuterol, ketorolac, magic mouthwash, nystatin, ondansetron (ZOFRAN) IV, promethazine, sodium chloride  Insulin Requirements in the past 24 hours:  15 units; 45 units insulin / BAG TNA, moderate SSI q4h.  Current Nutrition:  Clinimix E5/20 at 35ml/hr (goal - provides weekly average of 1578 kcal and 78g protein/day) NPO  Nutritional Goals:  1750-1970 kCal, 62-74 grams of protein per day  Assessment: 15 yof s/p ventral hernia repair (5/22) with CT abdomin showing adynamic ileus. Patient with a history of DM and may have diabetic gastroparesis and/or gastric outlet obstruction as well. NGT placed 5/30 with significant amount of bilious drainage. TNA started 5/30. Pt with positive flatus and bowel movements. PO medications changed to IV 5/30, now back to PO 6/3.  Tolerating NG clamping overnight - no N/V or abdominal pain.  Ileus is improving, advanced  diet today to full liquids.  GI: Post op day #12 s/p ventral hernia repair. CT abdomin showing adynamic ileus +/- gastroparesis/GOO. Pt passing flatus/BM. NGT placed 5/30 with 670ml/24hr emesis/NG output recorded.  HX GERD/gastritis on ppi. Reglan/bisacodyl on board. Endo: Hx Hypothyroidism/DM on home synthroid (IV) and TSH wnl. HbA1c 6.3. PO metformin/glipizide on hold d/t inability to swallow. CBGs slightly greater than goal of <150  Lytes: K, Mag ok - still slightly less than goal of >4/2, respectively.  Corr Ca slightlyelevated at 10.5 Renal: Scr stable, UOP reported as 1.4 ml/kg/hr.  Pulm: RA  Cards: Hx Afib/HTN/HLD/CHF (no recent echo), BP ok, tachycardic on IV digoxin, metoprolol, statin  (atenolol, diltiazem, lisinopril, pta po meds on hold).  On full dose lovenox - starting po coumadin today Hepatobil: Baseline Alk phos/LFTs WNL, Trigs increased today at 199 - will follow Neuro: Hx of depression/anxiety on fluoxetine (amytripyline on hold)  ID: Afebrile, WBC increased to 13.5, s/p 4 days Cipro for UTI.  Fluconazole added for yeast infection 2/2 cipro.  Urine Cx 5/30: 55,000 colonies, Mx spp. Cipro 5/30>>6/3  Best Practices: Lovenox, po ppi, home meds resumed   Plan:  -Continue Clinimix E5/20 at 58ml/hr (goal)  -IV trace elements, and lipids 20% MWF due to national shortage  -Start po mvi since patient now taking po meds -Increase insulin in TNA to 55 units/BAG -f/u Mag, K s/p MD repletion today -f/u am BMP, CBGs  Keeleigh Terris L. Illene Bolus, PharmD, BCPS Clinical Pharmacist Pager: [phone] 08/10/2011 1:28 PM

## 2011-08-10 NOTE — Progress Notes (Signed)
Physical Therapy Treatment Patient Details Name: [name] MRN: [id number] DOB: 06/22/1950 Today's Date: 08/10/2011 Time: 1100-1120 PT Time Calculation (min): 20 min  PT Assessment / Plan / Recommendation Comments on Treatment Session  Good use of cane today, could possibly progress to no AD prior to d/c home. Will also begin HEP next visit.     Follow Up Recommendations  Home health PT    Barriers to Discharge        Equipment Recommendations  Cane;3 in 1 bedside comode    Recommendations for Other Services    Frequency Min 3X/week   Plan Discharge plan remains appropriate;Frequency remains appropriate    Precautions / Restrictions         Mobility  Bed Mobility Supine to Sit: 6: Modified independent (Device/Increase time) Sit to Supine: 6: Modified independent (Device/Increase time) Transfers Sit to Stand: 5: Supervision;With upper extremity assist (with cane) Stand to Sit: 5: Supervision;With upper extremity assist Details for Transfer Assistance: cues for technique with cane when standing and sitting down, good follow through, slight instability immediately upon standing but pt able to correct with use of cane Ambulation/Gait Ambulation/Gait Assistance: 5: Supervision Ambulation Distance (Feet): 400 Feet Assistive device: Straight cane Ambulation/Gait Assistance Details: amb with decreased speed needing min v/c's for technique with cane Gait Pattern: Step-through pattern;Decreased stride length Stairs Assistance: 5: Supervision Stairs Assistance Details (indicate cue type and reason): ascended/descended 2 stairs initially with use of rail unilaterally (left to ascend, right to descend); pt then able to perform 2 steps with use of cane in LUE, good technique, minimal v/'cs for sequencing with cane Stair Management Technique: Step to pattern;With cane Number of Stairs: 4  (2x2)    Exercises      PT Goals Acute Rehab PT Goals Pt will go Supine/Side to Sit:  Independently;with HOB 0 degrees PT Goal: Supine/Side to Sit - Progress: Updated due to goal met PT Goal: Sit to Supine/Side - Progress: Partly met PT Goal: Sit to Stand - Progress: Progressing toward goal PT Goal: Stand to Sit - Progress: Progressing toward goal PT Transfer Goal: Bed to Chair/Chair to Bed - Progress: Progressing toward goal PT Goal: Ambulate - Progress: Progressing toward goal PT Goal: Up/Down Stairs - Progress: Met  Visit Information  Last PT Received On: 08/10/11 Assistance Needed: +1    Subjective Data  Subjective: I am so skinny, I can't even believe it. Its the skinniest I've been in years.    Cognition  Overall Cognitive Status: Appears within functional limits for tasks assessed/performed Arousal/Alertness: Awake/alert Orientation Level: Appears intact for tasks assessed Behavior During Session: [facility] for tasks performed    Balance     End of Session PT - End of Session Equipment Utilized During Treatment: Gait belt Activity Tolerance: Patient tolerated treatment well Patient left: in bed;with call bell/phone within reach    [facility] [name] 08/10/2011, 11:49 AM

## 2011-08-10 NOTE — Progress Notes (Signed)
ANTICOAGULATION CONSULT NOTE - Initial Consult  Pharmacy Consult for Coumadin Indication: atrial fibrillation  Allergies  Allergen Reactions  . Gemfibrozil Swelling    REACTION: Angioedema  . Latex Other (See Comments)    Blisters where touched or applied  . Penicillins Hives    Will spread in patches all over the body.  . Adhesive (Tape) Other (See Comments)    Will blister skin where applied - do not use BAND-AIDS.  Marland Kitchen Codeine Hives    Will spread in patches all over the body.    Patient Measurements: Height: 5\' 3"  (160 cm) Weight: 193 lb 14.4 oz (87.952 kg) IBW/kg (Calculated) : 52.4  Vital Signs: Temp: 98.1 F (36.7 C) (06/03 1421) Temp src: Oral (06/03 1421) BP: 131/72 mmHg (06/03 1421) Pulse Rate: 83  (06/03 1421) Labs:  Basename 08/10/11 0603 08/09/11 0605 08/08/11 0430  HGB 13.3 -- 13.0  HCT 39.4 -- 37.6  PLT 397 -- 345  APTT -- -- --  LABPROT 13.6 13.8 14.6  INR 1.02 1.04 1.12  HEPARINUNFRC -- -- --  CREATININE 0.49* 0.44* 0.46*  CKTOTAL -- -- --  CKMB -- -- --  TROPONINI -- -- --    Estimated Creatinine Clearance: 75.7 ml/min (by C-G formula based on Cr of 0.49).   Medical History: Past Medical History  Diagnosis Date  . Depression   . Obesity   . Skin neoplasm   . Bunion   . Carotid stenosis   . Fibromyalgia   . Internal hemorrhoid   . GERD (gastroesophageal reflux disease)   . Chronic gastritis   . Transaminase or LDH elevation   . Mild cognitive impairment   . Hyperlipidemia   . Fatty liver   . Lumbar back pain   . Diabetic peripheral neuropathy   . Diverticulosis   . Dysphagia     no documented strictures but responded positively to dilation in past.   . Hypertension   . Cholecystitis     s/p cholecystectomy  . Sarcoidosis   . Fibromyalgia   . CHF (congestive heart failure)   . Skin cancer 1990's    "front of my right leg"  . Atrial fibrillation   . OSA (obstructive sleep apnea) 2003    "can't sleep w/that darm machine"  .  Exertional dyspnea   . Hypothyroidism   . Type II diabetes mellitus   . TIA (transient ischemic attack)     07/29/11 pt denies this histor  . H/O hiatal hernia   . Epileptic seizure, tonic     .No meds since age of 24.  . Osteoarthritis     Medications:  Scheduled:    . atenolol  25 mg Oral BID  . bisacodyl  10 mg Rectal Daily  . digoxin  0.25 mg Oral Daily  . diltiazem  180 mg Oral Daily  . enoxaparin (LOVENOX) injection  1.5 mg/kg Subcutaneous Q24H  . fluconazole  150 mg Oral Once  . FLUoxetine  20 mg Oral Daily  . insulin aspart  0-15 Units Subcutaneous Q4H  . ketorolac  1 drop Right Eye BID  . levothyroxine  75 mcg Oral QAC breakfast  . lidocaine  20 mL Mouth/Throat Q4H  . magnesium sulfate LVP 250-500 ml  2 g Intravenous Once  . metoCLOPramide (REGLAN) injection  10 mg Intravenous Q6H  . mulitivitamin with minerals  1 tablet Oral Daily  . pantoprazole (PROTONIX) IV  40 mg Intravenous Q24H  . potassium chloride  10 mEq Intravenous Q1 Hr x  4  . simvastatin  10 mg Oral q1800  . DISCONTD: ciprofloxacin  400 mg Intravenous Q12H  . DISCONTD: digoxin  0.125 mg Intravenous Daily  . DISCONTD: levothyroxine  38 mcg Intravenous QAC breakfast  . DISCONTD: metoprolol  5 mg Intravenous Q6H    Assessment: 61 yo f s/p ventral hernia repair who resumed bridge therapy with lovenox and coumadin for afib - d#1 of restart coumadin.  Anticoagulation: Coumadin for Afib held 5/30 d/t NPO status and possible plans for upper endo to r/o GOO. Lovenox bridge (1.5mg /kg/d per MD) d/t Coumadin interruption for surgery. INR is below goal. H/H &, plts stable. SCr stable. Restart Coumadin today- d/c lovenox when INR therapeutic. Did receive one dose of fluconazole today and on Cipro Home Coumadin dose: 5 mg MWF and 2.5 mg TTSS Plan: Coumadin 5mg  po x1 today. Daily PT/INR. Continue Lovenox at 1.5mg /kg/d.   Goal of Therapy:  INR 2-3   Plan:  1. Coumadin 5mg  po x1 at 1800. 2. Daily PT/INR.  Link Snuffer, PharmD, BCPS Clinical Pharmacist [phone] 08/10/2011,2:58 PM

## 2011-08-10 NOTE — Progress Notes (Signed)
12 Days Post-Op  Subjective: Continues to be upbeat and states she is feeling well. Denies nausea or pain. Tolerating clear liquids. Had 2 stools yesterday and passing flatus. She states she does not feel distended. NG drained 550 cc last 24 hours, being intermittently clamped.  X-rays yesterday showed slow resolution of ileus pattern. Gastric distention less.  WBC(today) 13,500. K 3.9(yesterday)  Objective: Vital signs in last 24 hours: Temp:  [97.9 F (36.6 C)-99.3 F (37.4 C)] 97.9 F (36.6 C) (06/03 0626) Pulse Rate:  [101-120] 101  (06/03 0626) Resp:  [18] 18  (06/03 0626) BP: (138-146)/(75-90) 146/77 mmHg (06/03 0626) SpO2:  [98 %-100 %] 98 % (06/03 0626) Last BM Date: 08/09/11  Intake/Output from previous day: 06/02 0701 - 06/03 0700 In: 4920 [P.O.:960; I.V.:2400; TPN:1560] Out: 3550 [Urine:3000; Emesis/NG output:550] Intake/Output this shift: Total I/O In: 3960 [I.V.:2400; TPN:1560] Out: 1750 [Urine:1500; Emesis/NG output:250]  General appearance: alert, mental status normal, in good spirits. No distress. GI:  Abdomen soft. Nontender. Not tympanitic. Not obviously distended. wounds looks fine.  Lab Results:  Results for orders placed during the hospital encounter of 07/29/11 (from the past 24 hour(s))  GLUCOSE, CAPILLARY     Status: Abnormal   Collection Time   08/09/11  7:47 AM      Component Value Range   Glucose-Capillary 160 (*) 70 - 99 (mg/dL)   Comment 1 Notify RN    GLUCOSE, CAPILLARY     Status: Abnormal   Collection Time   08/09/11 11:51 AM      Component Value Range   Glucose-Capillary 176 (*) 70 - 99 (mg/dL)   Comment 1 Notify RN    GLUCOSE, CAPILLARY     Status: Abnormal   Collection Time   08/09/11  4:57 PM      Component Value Range   Glucose-Capillary 195 (*) 70 - 99 (mg/dL)   Comment 1 Notify RN    GLUCOSE, CAPILLARY     Status: Abnormal   Collection Time   08/09/11  8:19 PM      Component Value Range   Glucose-Capillary 142 (*) 70 - 99 (mg/dL)     Comment 1 Notify RN     Comment 2 Documented in Chart    GLUCOSE, CAPILLARY     Status: Abnormal   Collection Time   08/10/11 12:00 AM      Component Value Range   Glucose-Capillary 210 (*) 70 - 99 (mg/dL)   Comment 1 Notify RN     Comment 2 Documented in Chart    GLUCOSE, CAPILLARY     Status: Abnormal   Collection Time   08/10/11  4:21 AM      Component Value Range   Glucose-Capillary 190 (*) 70 - 99 (mg/dL)   Comment 1 Notify RN     Comment 2 Documented in Chart    CBC     Status: Abnormal   Collection Time   08/10/11  6:03 AM      Component Value Range   WBC 13.5 (*) 4.0 - 10.5 (K/uL)   RBC 4.20  3.87 - 5.11 (MIL/uL)   Hemoglobin 13.3  12.0 - 15.0 (g/dL)   HCT 40.9  81.1 - 91.4 (%)   MCV 93.8  78.0 - 100.0 (fL)   MCH 31.7  26.0 - 34.0 (pg)   MCHC 33.8  30.0 - 36.0 (g/dL)   RDW 78.2  95.6 - 21.3 (%)   Platelets 397  150 - 400 (K/uL)  DIFFERENTIAL  Status: Abnormal   Collection Time   08/10/11  6:03 AM      Component Value Range   Neutrophils Relative 69  43 - 77 (%)   Neutro Abs 9.4 (*) 1.7 - 7.7 (K/uL)   Lymphocytes Relative 19  12 - 46 (%)   Lymphs Abs 2.6  0.7 - 4.0 (K/uL)   Monocytes Relative 7  3 - 12 (%)   Monocytes Absolute 1.0  0.1 - 1.0 (K/uL)   Eosinophils Relative 4  0 - 5 (%)   Eosinophils Absolute 0.6  0.0 - 0.7 (K/uL)   Basophils Relative 0  0 - 1 (%)   Basophils Absolute 0.1  0.0 - 0.1 (K/uL)     Studies/Results: @RISRSLT24 @     . bisacodyl  10 mg Rectal Daily  . ciprofloxacin  400 mg Intravenous Q12H  . digoxin  0.125 mg Intravenous Daily  . enoxaparin (LOVENOX) injection  1.5 mg/kg Subcutaneous Q24H  . FLUoxetine  20 mg Oral Daily  . insulin aspart  0-15 Units Subcutaneous Q4H  . ketorolac  1 drop Right Eye BID  . levothyroxine  38 mcg Intravenous QAC breakfast  . lidocaine  20 mL Mouth/Throat Q4H  . magnesium sulfate 1 - 4 g bolus IVPB  2 g Intravenous Once  . metoCLOPramide (REGLAN) injection  10 mg Intravenous Q6H  . metoprolol  5 mg  Intravenous Q6H  . pantoprazole (PROTONIX) IV  40 mg Intravenous Q24H  . potassium chloride  10 mEq Intravenous Q1 Hr x 4  . simvastatin  10 mg Oral q1800  . white petrolatum         Assessment/Plan: s/p Procedure(s): LAPAROSCOPIC VENTRAL HERNIA INSERTION OF MESH  POD #11. Gastric distention and ileus clinically are resolving. Because this has happened more than once will continue to be cautious. We'll clamp the NG tube continuously today and advance to fully liquid diet. Check abdominal films tomorrow Continue IV Reglan No narcotics It continues to improve clinically and radiographically will remove NG tube tomorrow  Hypokalemia. Improving. Continue to push potassium supplementation to get potassium greater than 4 point  Protein calorie malnutrition. Check prealbumin Continue TNA.  Leukocytosis. Significance and etiology unclear.  Atrial fibrillation. Stable. On full dose Lovenox, IV digoxin, and IV beta blockers. Management per internal medicine service. Resume Coumadin and Cardizem once back on diet  Type 2 diabetes. Reasonable control. Continue Lantus insulin scale insulin today. Resume oral medications, possibly tomorrow a GI problems have resolved  Hypothyroidism. TSH normal. On IV Synthroid.  Hypertension. Reasonable control  Chronic anxiety and depression. Clinically doing well emotionally. On Prozac  Somewhat deconditioned. HHPT, HHOT, HHN  requested.    LOS: 12 days    Shemar Plemmons M. Derrell Lolling, M.D., [facility] General and Minimally invasive Surgery Breast and Colorectal Surgery Office:   [phone] Pager:   [phone]  08/10/2011  . .prob

## 2011-08-11 ENCOUNTER — Inpatient Hospital Stay (HOSPITAL_COMMUNITY): Payer: Medicare Other

## 2011-08-11 DIAGNOSIS — K929 Disease of digestive system, unspecified: Secondary | ICD-10-CM

## 2011-08-11 DIAGNOSIS — K560 Paralytic ileus: Secondary | ICD-10-CM

## 2011-08-11 DIAGNOSIS — K567 Ileus, unspecified: Secondary | ICD-10-CM

## 2011-08-11 LAB — BASIC METABOLIC PANEL
BUN: 8 mg/dL (ref 6–23)
CO2: 22 mEq/L (ref 19–32)
GFR calc Af Amer: >90 mL/min (ref >90)
GFR calc non Af Amer: >90 mL/min (ref >90)
Glucose, Bld: 204 mg/dL — ABNORMAL HIGH (ref 70–99)
Potassium: 4.3 mEq/L (ref 3.5–5.1)
Sodium: 131 mEq/L — ABNORMAL LOW (ref 135–145)

## 2011-08-11 LAB — PROTIME-INR
INR: 1.16 (ref 0.00–1.49)
Prothrombin Time: 15 seconds (ref 11.6–15.2)

## 2011-08-11 LAB — GLUCOSE, CAPILLARY
Glucose-Capillary: 213 mg/dL — ABNORMAL HIGH (ref 70–99)
Glucose-Capillary: 182 mg/dL — ABNORMAL HIGH (ref 70–99)
Glucose-Capillary: 182 mg/dL — ABNORMAL HIGH (ref 70–99)
Glucose-Capillary: 157 mg/dL — ABNORMAL HIGH (ref 70–99)
Glucose-Capillary: 137 mg/dL — ABNORMAL HIGH (ref 70–99)

## 2011-08-11 MED ORDER — ALTEPLASE 2 MG IJ SOLR
2.0000 mg | Freq: Once | INTRAMUSCULAR | Status: AC
  Administered 2011-08-11: 2 mg
  Filled 2011-08-11: qty 2

## 2011-08-11 MED ORDER — WARFARIN SODIUM 5 MG PO TABS
5.0000 mg | ORAL_TABLET | Freq: Once | ORAL | Status: DC
  Filled 2011-08-11: qty 1

## 2011-08-11 MED ORDER — PREDNISOLONE ACETATE 1 % OP SUSP
1.0000 [drp] | Freq: Two times a day (BID) | OPHTHALMIC | Status: DC
  Administered 2011-08-11 – 2011-08-16 (×12): 1 [drp] via OPHTHALMIC
  Filled 2011-08-11: qty 1

## 2011-08-11 MED ORDER — INSULIN GLARGINE 100 UNIT/ML ~~LOC~~ SOLN
10.0000 [IU] | Freq: Every day | SUBCUTANEOUS | Status: DC
  Administered 2011-08-11 – 2011-08-13 (×3): 10 [IU] via SUBCUTANEOUS

## 2011-08-11 MED ORDER — DIGOXIN 0.25 MG/ML IJ SOLN
0.1250 mg | Freq: Every day | INTRAMUSCULAR | Status: DC
  Administered 2011-08-11 – 2011-08-15 (×5): 0.125 mg via INTRAVENOUS
  Filled 2011-08-11 (×8): qty 0.5

## 2011-08-11 MED ORDER — METOPROLOL TARTRATE 1 MG/ML IV SOLN
5.0000 mg | Freq: Four times a day (QID) | INTRAVENOUS | Status: DC
  Administered 2011-08-11 – 2011-08-16 (×20): 5 mg via INTRAVENOUS
  Filled 2011-08-11 (×24): qty 5

## 2011-08-11 MED ORDER — POTASSIUM CHLORIDE IN NACL 40-0.9 MEQ/L-% IV SOLN
INTRAVENOUS | Status: DC
  Administered 2011-08-11: 100 mL/h via INTRAVENOUS
  Administered 2011-08-12 – 2011-08-14 (×5): via INTRAVENOUS
  Filled 2011-08-11 (×11): qty 1000

## 2011-08-11 MED ORDER — POTASSIUM CHLORIDE CRYS ER 20 MEQ PO TBCR
40.0000 meq | EXTENDED_RELEASE_TABLET | Freq: Every day | ORAL | Status: DC
  Administered 2011-08-12 – 2011-08-16 (×4): 40 meq via ORAL
  Filled 2011-08-11 (×7): qty 2

## 2011-08-11 MED ORDER — LEVOTHYROXINE SODIUM 100 MCG IV SOLR
50.0000 ug | Freq: Every day | INTRAVENOUS | Status: DC
  Administered 2011-08-11 – 2011-08-15 (×5): 50 ug via INTRAVENOUS
  Filled 2011-08-11 (×6): qty 2.5

## 2011-08-11 MED ORDER — IOHEXOL 300 MG/ML  SOLN
300.0000 mL | Freq: Once | INTRAMUSCULAR | Status: AC | PRN
  Administered 2011-08-11: 300 mL

## 2011-08-11 MED ORDER — INSULIN REGULAR HUMAN 100 UNIT/ML IJ SOLN
INTRAVENOUS | Status: AC
  Administered 2011-08-11: 18:00:00 via INTRAVENOUS
  Filled 2011-08-11: qty 2000

## 2011-08-11 MED ORDER — MAGNESIUM SULFATE 50 % IJ SOLN
2.0000 g | Freq: Once | INTRAMUSCULAR | Status: AC
  Administered 2011-08-11: 2 g via INTRAVENOUS
  Filled 2011-08-11: qty 4

## 2011-08-11 NOTE — Progress Notes (Addendum)
Subjective: Patient states that she feels ok. Able to tolerate full liquid diet without problem.  She states that she feels her abd more distended this morning. Her Bad X ray reviewed with her surgeon Dr. Derrell Lolling.  Objective: Vital signs in last 24 hours: Filed Vitals:   08/10/11 1110 08/10/11 1421 08/10/11 2105 08/11/11 0508  BP:  131/72 152/86 143/78  Pulse:  83 97 84  Temp:  98.1 F (36.7 C) 98 F (36.7 C) 98.1 F (36.7 C)  TempSrc:  Oral Oral Oral  Resp:  18 18 18   Height:      Weight:      SpO2: 94% 96% 97% 98%   Weight change:   Intake/Output Summary (Last 24 hours) at 08/11/11 0930 Last data filed at 08/11/11 0600  Gross per 24 hour  Intake 4413.66 ml  Output    925 ml  Net 3488.66 ml   Physical exam  General: NAD. NGT clamped Cardiac: irregularly irregular rhythm, no M/G/R  Pulm: CTA B/L  Abd: Soft, active BS x 4. Non tender. Mild distension. Multiple laparoscopic incisions healing well.  Ext: warm and well perfused, no pedal edema. No tenderness or swelling. No erythema.  Neuro: alert and oriented X3, cranial nerves II-XII grossly intact   lab Results: Basic Metabolic Panel:  Lab 08/11/11 1610 08/10/11 0603 08/08/11 0430  NA 131* 135 --  K 4.3 3.8 --  CL 97 98 --  CO2 22 26 --  GLUCOSE 204* 173* --  BUN 8 8 --  CREATININE 0.39* 0.49* --  CALCIUM 9.4 9.5 --  MG 1.8 1.8 --  PHOS -- 3.5 3.4   Liver Function Tests:  Lab 08/10/11 0603 08/07/11 0520  AST 25 19  ALT 30 36*  ALKPHOS 93 92  BILITOT 0.5 0.4  PROT 6.7 6.0  ALBUMIN 2.7* 2.5*   CBC:  Lab 08/10/11 0603 08/08/11 0430 08/07/11 0520  WBC 13.5* 9.7 --  NEUTROABS 9.4* -- 6.0  HGB 13.3 13.0 --  HCT 39.4 37.6 --  MCV 93.8 92.6 --  PLT 397 345 --   CBG:  Lab 08/11/11 0758 08/11/11 0353 08/10/11 2342 08/10/11 1944 08/10/11 1621 08/10/11 1204  GLUCAP 182* 213* 214* 210* 175* 138*   Fasting Lipid Panel:  Lab 08/10/11 0603  CHOL 112  HDL --  LDLCALC --  TRIG 199*  CHOLHDL --    LDLDIRECT --   Coagulation:  Lab 08/11/11 0655 08/10/11 0603 08/09/11 0605 08/08/11 0430  LABPROT 15.0 13.6 13.8 14.6  INR 1.16 1.02 1.04 1.12   Urinalysis:  Lab 08/06/11 0900  COLORURINE AMBER*  LABSPEC 1.031*  PHURINE 5.5  GLUCOSEU NEGATIVE  HGBUR NEGATIVE  BILIRUBINUR SMALL*  KETONESUR NEGATIVE  PROTEINUR 30*  UROBILINOGEN 1.0  NITRITE NEGATIVE  LEUKOCYTESUR SMALL*    Micro Results: Recent Results (from the past 240 hour(s))  URINE CULTURE     Status: Normal   Collection Time   08/06/11  9:00 AM      Component Value Range Status Comment   Specimen Description URINE, CLEAN CATCH   Final    Special Requests NONE   Final    Culture  Setup Time [id number]   Final    Colony Count 55,000 COLONIES/ML   Final    Culture     Final    Value: Multiple bacterial morphotypes present, none predominant. Suggest appropriate recollection if clinically indicated.   Report Status 08/07/2011 FINAL   Final   WET PREP, GENITAL     Status:  Abnormal   Collection Time   08/10/11 10:24 AM      Component Value Range Status Comment   Yeast Wet Prep HPF POC NONE SEEN  NONE SEEN  Final SPECIMEN SUBMITTED IN 2 mls OF SALINE   Trich, Wet Prep NONE SEEN  NONE SEEN  Final    Clue Cells Wet Prep HPF POC NONE SEEN  NONE SEEN  Final    WBC, Wet Prep HPF POC FEW (*) NONE SEEN  Final    Studies/Results: Dg Abd 2 Views  08/23/2011  *RADIOLOGY REPORT*  Clinical Data: Abdominal distension, recurring ileus  ABDOMEN - 2 VIEW  Comparison: 08/09/2011; 08/08/2011; chest radiograph - 08/06/2011  Findings:  An enteric tube overlies the left upper abdominal quadrant. Interval increased gaseous distension of multiple loops of large and small bowel.  Interval passage of previously identified enteric contrast.  No pneumoperitoneum, pneumatosis or portal venous gas. Post cholecystectomy.  Limited visualization of the lower thorax demonstrates the tip of a right upper extremity PICC line overlying the superior aspect of  the right atrium.  There is mild elevation of the left hemidiaphragm.  No acute osseous abnormalities.  IMPRESSION: Increased gaseous distension of multiple loops of large and small bowel most suggestive of ileus.  Original Report Authenticated By: Waynard Reeds, M.D.   Medications: I have reviewed the patient's current medications. Scheduled Meds:   . alteplase  2 mg Intracatheter Once  . atenolol  25 mg Oral BID  . bisacodyl  10 mg Rectal Daily  . digoxin  0.25 mg Oral Daily  . diltiazem  180 mg Oral Daily  . enoxaparin (LOVENOX) injection  1.5 mg/kg Subcutaneous Q24H  . fluconazole  150 mg Oral Once  . FLUoxetine  20 mg Oral Daily  . insulin aspart  0-15 Units Subcutaneous Q4H  . insulin glargine  10 Units Subcutaneous QHS  . ketorolac  1 drop Right Eye BID  . levothyroxine  75 mcg Oral QAC breakfast  . lidocaine  20 mL Mouth/Throat Q4H  . magnesium sulfate LVP 250-500 ml  2 g Intravenous Once  . magnesium sulfate LVP 250-500 ml  2 g Intravenous Once  . mulitivitamin with minerals  1 tablet Oral Daily  . pantoprazole (PROTONIX) IV  40 mg Intravenous Q24H  . potassium chloride  10 mEq Intravenous Q1 Hr x 4  . potassium chloride  40 mEq Oral Daily  . simvastatin  10 mg Oral q1800  . warfarin  5 mg Oral ONCE-1800  . DISCONTD: warfarin  5 mg Oral ONCE-1800  . DISCONTD: Warfarin - Pharmacist Dosing Inpatient   Does not apply q1800   Continuous Infusions:   . 0.9 % NaCl with KCl 40 mEq / L    . fat emulsion 250 mL (08/10/11 1744)  . TPN (CLINIMIX) +/- additives 65 mL/hr at 08/09/11 1810  . TPN (CLINIMIX) +/- additives 65 mL/hr at 08/10/11 1744  . DISCONTD: dextrose 5 % and 0.45 % NaCl with KCl 20 mEq/L 100 mL/hr at 08-23-2011 0405   PRN Meds:.albuterol, ketorolac, magic mouthwash, nystatin, ondansetron (ZOFRAN) IV, promethazine, sodium chloride Assessment/Plan:  # Post Op ileus:  Ventral hernia s/p laparoscopic lysis of adhesions and hernia repair with mesh 07/29/11.  Ileus  waxing and waning, worsening today.    -NPO -TPN and NG to suction per Dr. Derrell Lolling.  - [facility] re consulted - switch Digoxin 0.25 mg po to 0.125 mg IV daily   - stop atenolol and Diltiazem>>start lopressor 5 mg  IV Q 6h after discussing with pharmacy - start synthroid 50 mcg IV daily - hold gabapentin and amitriptyline for now since on narcotics for pain. Restart these on discharge.   # Vaginal itching , better No discharge.  - wet prep genital and vaginal negtaive - likely yeast infection>>diflucan 150 mg po x1 on 08/10/11  # Hypomagnesemia and hypokalemia  - K 4.3 and Mg 1.8  - Mg eplaced this morning  - follow up BMP and Mg in am   # Leukocytosis:resolved and again 13.5 this am  Patient Afebrile. No apparent source of infection. No evidence of PNA (CXR), DVT (No S/S on exam and on full dose Lovenox) and Incision (No S/S)  ? De margination vs ileus / postoperative state vs possible UTI.  -UA Mild pyuria, rare bacteria, negative nitrates  -Urine Cx>>>indicated non clean catch.  - cipro for short course for presumed UTI>>>D/C'd  - CBC in am  Afib: Stable, rates ~100 bpm at goal on atenolol 25mg  PO BID, Diltiazem XR 180mg  PO daily, Digoxin 0.25 mg daily.  - full dose lovenox  - switch to IV meds - coumadin on hold  DM2: Well controlled. A1c- 6.3.  -on TPN.  - Continue lantus and  SSI>>>CBG good control   Hypothyroidism: TSH- 0.756. Normal.  - synthroid IV   Hypertension: BP up an down but control ok overall.  -Lopressor IV -hold Lisinopril for now   Hx of TIA/CVA: Unclear when pt had it. She refuses any Hx though.  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 This was discussed by Dr. Dierdre Searles with Dr. Pearlean Brownie with Neurology.  -Lovenox full dose in place of warfarin while NPO.   Depression: Continue PO Prozac if she can swallow it to avoid SSRI withdrawal   GERD: Will convert Protonix to IV  while NPO with NGT.   DVT Prophylaxis: Lovenox     LOS: 13 days   LI, NA 08/11/2011, 9:30 AM  Internal Medicine Teaching Service Attending Note Date: 08/11/2011  Patient name: [name]  Medical record number: [id number]  Date of birth: 16-Feb-1951    This patient has been seen and discussed with the house staff. Please see their note for complete details. I concur with their findings with the following additions/corrections:Patient with worsening ileus on Xray. She is back on NG suction. Keep k and mag replete CCS following closely. Elevated wbc likely from demargination due to ileus. Patient is at risk for C difficile and with ileus present presentation may NOT necessarily be with copious diarrhea, I am not terribly suspicous for that at this point.   Paulette Blanch Dam 08/11/2011, 11:59 AM

## 2011-08-11 NOTE — Progress Notes (Signed)
PARENTERAL NUTRITION CONSULT NOTE - FOLLOW UP  Pharmacy Consult for TPN Indication: adynamic Ileus s/p ventral hernia repair   Allergies  Allergen Reactions  . Gemfibrozil Swelling    REACTION: Angioedema  . Latex Other (See Comments)    Blisters where touched or applied  . Penicillins Hives    Will spread in patches all over the body.  . Adhesive (Tape) Other (See Comments)    Will blister skin where applied - do not use BAND-AIDS.  Marland Kitchen Codeine Hives    Will spread in patches all over the body.    Patient Measurements: Height: 5\' 3"  (160 cm) Weight: 193 lb 14.4 oz (87.952 kg) IBW/kg (Calculated) : 52.4  Adjusted Body Weight: 63  BMI: 34  Vital Signs: Temp: 97.5 F (36.4 C) (06/04 1011) Temp src: Oral (06/04 0508) BP: 146/80 mmHg (06/04 1011) Pulse Rate: 102  (06/04 1011) Intake/Output from previous day: 06/03 0701 - 06/04 0700 In: 4533.7 [P.O.:360; I.V.:2728.3; IV Piggyback:10; TPN:1435.3] Out: 1375 [Urine:1375] Intake/Output from this shift:    Labs:  Basename 08/11/11 0655 08/10/11 0603 08/09/11 0605  WBC -- 13.5* --  HGB -- 13.3 --  HCT -- 39.4 --  PLT -- 397 --  APTT -- -- --  INR 1.16 1.02 1.04     Basename 08/11/11 0655 08/10/11 0603 08/09/11 0605  NA 131* 135 137  K 4.3 3.8 3.9  CL 97 98 100  CO2 22 26 24   GLUCOSE 204* 173* 157*  BUN 8 8 8   CREATININE 0.39* 0.49* 0.44*  LABCREA -- -- --  CREAT24HRUR -- -- --  CALCIUM 9.4 9.5 9.7  MG 1.8 1.8 1.8  PHOS -- 3.5 --  PROT -- 6.7 --  ALBUMIN -- 2.7* --  AST -- 25 --  ALT -- 30 --  ALKPHOS -- 93 --  BILITOT -- 0.5 --  BILIDIR -- -- --  IBILI -- -- --  PREALBUMIN -- 19.8 --  TRIG -- 199* --  CHOLHDL -- -- --  CHOL -- 112 --   Estimated Creatinine Clearance: 75.7 ml/min (by C-G formula based on Cr of 0.39).    Basename 08/11/11 0758 08/11/11 0353 08/10/11 2342  GLUCAP 182* 213* 214*   Medications:  Scheduled:     . alteplase  2 mg Intracatheter Once  . bisacodyl  10 mg Rectal Daily    . digoxin  0.125 mg Intravenous Daily  . enoxaparin (LOVENOX) injection  1.5 mg/kg Subcutaneous Q24H  . FLUoxetine  20 mg Oral Daily  . insulin aspart  0-15 Units Subcutaneous Q4H  . insulin glargine  10 Units Subcutaneous QHS  . ketorolac  1 drop Right Eye BID  . levothyroxine  50 mcg Intravenous Daily  . lidocaine  20 mL Mouth/Throat Q4H  . magnesium sulfate LVP 250-500 ml  2 g Intravenous Once  . magnesium sulfate LVP 250-500 ml  2 g Intravenous Once  . metoprolol  5 mg Intravenous Q6H  . mulitivitamin with minerals  1 tablet Oral Daily  . pantoprazole (PROTONIX) IV  40 mg Intravenous Q24H  . potassium chloride  10 mEq Intravenous Q1 Hr x 4  . potassium chloride  40 mEq Oral Daily  . simvastatin  10 mg Oral q1800  . warfarin  5 mg Oral ONCE-1800  . DISCONTD: atenolol  25 mg Oral BID  . DISCONTD: digoxin  0.25 mg Oral Daily  . DISCONTD: diltiazem  180 mg Oral Daily  . DISCONTD: levothyroxine  75 mcg Oral QAC breakfast  .  DISCONTD: warfarin  5 mg Oral ONCE-1800  . DISCONTD: Warfarin - Pharmacist Dosing Inpatient   Does not apply q1800   Infusions:     . 0.9 % NaCl with KCl 40 mEq / L 100 mL/hr (08/11/11 1057)  . fat emulsion 250 mL (08/10/11 1744)  . TPN (CLINIMIX) +/- additives 65 mL/hr at 08/09/11 1810  . TPN (CLINIMIX) +/- additives 65 mL/hr at 08/10/11 1744  . DISCONTD: dextrose 5 % and 0.45 % NaCl with KCl 20 mEq/L 100 mL/hr at 08/11/11 0405   PRN: albuterol, ketorolac, magic mouthwash, nystatin, ondansetron (ZOFRAN) IV, promethazine, sodium chloride  Insulin Requirements in the past 24 hours:  18 units; 55 units insulin / BAG TNA, moderate SSI q4h.  Current Nutrition:  Clinimix E5/20 at 52ml/hr (goal - provides weekly average of 1578 kcal and 78g protein/day) NPO  Nutritional Goals:  1750-1970 kCal, 62-74 grams of protein per day  Assessment: 50 yof s/p ventral hernia repair (5/22) with CT abdomin showing adynamic ileus. Patient with a history of DM and may have  diabetic gastroparesis and/or gastric outlet obstruction as well. NGT placed 5/30 with significant amount of bilious drainage. TNA started 5/30. Pt with positive flatus and bowel movements. PO meds changed back to IV due to worsening ileus. Plan to continue TPN.   GI: Post op day #13 s/p ventral hernia repair. CT abdomin showing adynamic ileus +/- gastroparesis/GOO. Pt passing flatus/BM and tolerating liquids but abdomen is more distended this AM. NGT clamped.  HX GERD/gastritis on ppi. Reglan/bisacodyl on board - Prealbumin improved to 19.8. Endo: Hx Hypothyroidism/DM on synthroid IV (higher than home dose) and TSH wnl. HbA1c 6.3. PO metformin/glipizide on hold d/t inability to swallow. CBGs slightly greater than goal of <150  Lytes: K ok, Mag slightly lower than goal at 1.8 (goal of >4/2, respectively) - ordered mag 2gm this AM per MD and daily KCl Renal: Scr stable, UOP reported as 0.7 ml/kg/hr.  Pulm: RA  Cards: Hx Afib/HTN/HLD/CHF (no recent echo), BP ok, tachycardic on IV digoxin, metoprolol, statin  (atenolol, diltiazem, lisinopril, pta po meds on hold).  On full dose lovenox (coumadin on hold) Hepatobil: Baseline Alk phos/LFTs WNL, Trigs increased today at 199 - will follow Neuro: Hx of depression/anxiety on fluoxetine (amytripyline on hold)  ID: Afebrile, WBC increased to 13.5, s/p 4 days Cipro for UTI.  Fluconazole added for yeast infection 2/2 cipro.  Urine Cx 5/30: 55,000 colonies, Mx spp. Cipro 5/30>>6/3  Best Practices: Lovenox, po ppi, home meds resumed   Plan:  -Continue Clinimix E5/20 at 50ml/hr (goal)  -IV trace elements, and lipids 20% MWF due to national shortage  -Start po mvi since patient now taking po meds -Increase insulin in TNA to 60 units/BAG -f/u Mag, K s/p MD repletion today -f/u am BMP, CBGs, Mg, Phos  Lysle Pearl, PharmD, BCPS Pager # [phone] 08/11/2011 11:50 AM

## 2011-08-11 NOTE — Progress Notes (Signed)
ANTICOAGULATION CONSULT NOTE - Follow Up Consult  Pharmacy Consult for coumadin Indication: atrial fibrillation  Vital Signs: Temp: 98.1 F (36.7 C) (06/04 0508) Temp src: Oral (06/04 0508) BP: 143/78 mmHg (06/04 0508) Pulse Rate: 84  (06/04 0508)  Labs:  Alvira Philips 08/11/11 0655 08/10/11 0603 08/09/11 0605  HGB -- 13.3 --  HCT -- 39.4 --  PLT -- 397 --  APTT -- -- --  LABPROT 15.0 13.6 13.8  INR 1.16 1.02 1.04  HEPARINUNFRC -- -- --  CREATININE -- 0.49* 0.44*  CKTOTAL -- -- --  CKMB -- -- --  TROPONINI -- -- --    Estimated Creatinine Clearance: 75.7 ml/min (by C-G formula based on Cr of 0.49).  Assessment: 48 yof s/p ventral hernia repair who resumed bridge therapy with lovenox and coumadin for afib yesterday. INR today remains subtherapeutic as expected at 1.16. Pt continues on lovenox per MD dosed at 1.5mg /kg/day. No CBC available today but no evidence of bleeding noted.  Home dose: 5mg  MWF, 2.5mg  TTSS  Goal of Therapy:  INR 2-3   Plan:  1. Coumadin 5mg  PO x 1 tonight 2. F/u AM INR 3. Continue lovenox per MD  Gared Gillie, Drake Leach 08/11/2011,7:58 AM

## 2011-08-11 NOTE — Progress Notes (Signed)
[facility] Daily Rounding Note 08/11/2011, 11:41 AM  SUBJECTIVE:       See consult of 5/31 and GI progress note of 6/1, 6/2 when she was having BMs, flatus and improving abdominal films. NGT intermittently  clamped 6/2 and 6/3 put out 550 cc during that time and tolerating clears.  Advanced to full liquids 6/3 accompanied by ongoing TNA. More soft stools on 6/3. This AM bloating/distention has resumed and x rays show recurring small and large bowel ileus pattern. Contrast seen in right colon from the CT of 5/30.   She continues to have soft, moderate sized BMs and flatus, despite the recurrent distention.  NGT output today is less than 100 cc.  Dr Derrell Lolling has ordered a GG study.   Meds include once daily Dulcolax suppository, once daily IV Protonix, Lovenox, Insulin, Coumadin Reglan IV d/cd 6/3. Hydrocodone and Dilaudid dc'd 5/31 No abx in use.  Cipro d/cd 6/3,   Potassium, Mag and phos levels not abnormal since 5/30 is receiving periodic potassium and magnesium.  TSH normal.  No evidence of UTI.  WBCs as high as 17.7 5/29, down to 9.7 on 6/1, up to 13.5 6/3.     OBJECTIVE:        General: looks well, NAD     Vital signs in last 24 hours:    Temp:  [97.5 F (36.4 C)-98.1 F (36.7 C)] 97.5 F (36.4 C) (06/04 1011) Pulse Rate:  [83-102] 102  (06/04 1011) Resp:  [18] 18  (06/04 1011) BP: (131-152)/(72-86) 146/80 mmHg (06/04 1011) SpO2:  [96 %-98 %] 97 % (06/04 1011) Last BM Date: 08/10/11  Heart: Irreg/Irreg Chest: clear B.  No dyspnea or cough Abdomen: mild distention, NT, hypoactive BS.   Extremities: no CCE Neuro/Psych:  Pleasant, not anxious or depressed.   Intake/Output from previous day: 06/03 0701 - 06/04 0700 In: 4533.7 [P.O.:360; I.V.:2728.3; IV Piggyback:10; TPN:1435.3] Out: 1375 [Urine:1375]  Intake/Output this shift:    Lab Results:  Basename 08/10/11 0603  WBC 13.5*  HGB 13.3  HCT 39.4  PLT 397   BMET  Basename 08/11/11 0655 08/10/11 0603 08/09/11  0605  NA 131* 135 137  K 4.3 3.8 3.9  CL 97 98 100  CO2 22 26 24   GLUCOSE 204* 173* 157*  BUN 8 8 8   CREATININE 0.39* 0.49* 0.44*  CALCIUM 9.4 9.5 9.7   LFT  Basename 08/10/11 0603  PROT 6.7  ALBUMIN 2.7*  AST 25  ALT 30  ALKPHOS 93  BILITOT 0.5  BILIDIR --  IBILI --   PT/INR  Basename 08/11/11 0655 08/10/11 0603  LABPROT 15.0 13.6  INR 1.16 1.02   Studies/Results: Dg Abd 2 Views 08/11/2011  *RADIOLOGY REPORT*  Clinical Data: Abdominal distension, recurring ileus  ABDOMEN - 2 VIEW  Comparison: 08/09/2011; 08/08/2011; chest radiograph - 08/06/2011  Findings:  An enteric tube overlies the left upper abdominal quadrant. Interval increased gaseous distension of multiple loops of large and small bowel.  Interval passage of previously identified enteric contrast.  No pneumoperitoneum, pneumatosis or portal venous gas. Post cholecystectomy.  Limited visualization of the lower thorax demonstrates the tip of a right upper extremity PICC line overlying the superior aspect of the right atrium.  There is mild elevation of the left hemidiaphragm.  No acute osseous abnormalities.  IMPRESSION: Increased gaseous distension of multiple loops of large and small bowel most suggestive of ileus.  Original Report Authenticated By: Waynard Reeds, M.D.    ASSESMENT: *  Post op ileus SB, GOO post lap ventral hernia repairs and extensive LOA  5/22. Increased large and small bowel ileus.  She is diabetic so wonder if she has element of gastroparesis.  * Remote repair of HH. Subsequent c/o dysphagia but no strictures, has been empirically dilated. Has recurrent dysphagia. On Omeprazole PTA, IV protonix currently.  * IDDM  *  Renal insufficiency.  subtherapeutic INR * Fatty liver, has had elevated transaminases in past.  * A fib, chronic coumadin  * Chronic pain Syndrome, fibromyalgia. Seems well controlled without narcotics currently.  * Polypharmacy. List of 25 meds/supplements on PTA med list.     PLAN: *  GG UGI series today.  *  Should we hold the Coumadin in case she needs an intervention?    LOS: 13 days   [name]  08/11/2011, 11:41 AM Pager: [phone]

## 2011-08-11 NOTE — Progress Notes (Signed)
Patient seen, examined, and I agree with the above documentation, including the assessment and plan.  UPPER GI SERIES WITHOUT KUB  Technique: Routine upper GI series was performed with water-  soluble contrast (300 ml Omnipaque-300) per nasogastric tube.  Fluoroscopy Time: 0.55 minutes  Comparison: Abdominal radiographs same date. Abdominal pelvic CT  08/06/2011.  Findings: The radiographs obtained earlier today demonstrate  moderate distension of the stomach and proximal small bowel. There  is mild distension of the distal small bowel and colon.  The stomach was filled easily. Turning the patient erect and into  the right lateral decubitus positions demonstrates no mucosal  ulceration or wall thickening. Within the right lateral decubitus  position, there is rapid emptying into a moderately distended  duodenum. The duodenum demonstrates uniform fold thickening. No  focal mucosal ulceration is identified. Contrast passes between the  superior mesenteric artery and the aorta. The fourth portion of  the duodenum and proximal jejunum are also mildly distended. No  focal transition point or extravasation is seen.  The esophagus is not evaluated by this examination performed per  nasogastric tube. No reflux was seen.  IMPRESSION:  1. No evidence of gastric outlet obstruction.  2. The stomach and proximal small bowel are moderately distended  without demonstrated focal transition point. Recent CT  demonstrated a relatively gradual transition to normal caliber  distal small bowel such that mechanical obstruction remains  unlikely. Radiographic followup suggested.  3. Uniform wall thickening of the duodenum likely represents  edema.  --Overall picture remains consistent with ileus.  Gastroparesis could be contributing some, but GOO is not evidence on the UGI series.  There is no transition point to suggest obstruction. At this point I do not think EGD would provide additional benefit  diagnostically or therapeutically Agree with TPN Continue decompression via NGT for now. Keep K > 4 Would hold on pro-motility agents for now, as this might cause more pain given ileus Will follow.

## 2011-08-11 NOTE — Progress Notes (Signed)
PT Cancellation Note  Treatment cancelled today due to polite patient's refusal to participate. Pt not feeling well today and requesting to just rest.   [facility] [name] 08/11/2011, 3:27 PM Pager: [phone]

## 2011-08-11 NOTE — Progress Notes (Addendum)
13 Days Post-Op  Subjective: Patient feels well and looks well. NG has been clamped for 24 hours. Tolerating full liquid diet. Had 3 stools.  Being transitioned back to oral medications, including Coumadin by the internal medicine service. Medicine service advice greatly appreciated.  She denies bloating, pain, Hiccoughs, or any obstructive symptoms. Xray pending  Objective: Vital signs in last 24 hours: Temp:  [97.9 F (36.6 C)-98.1 F (36.7 C)] 98.1 F (36.7 C) (06/04 0508) Pulse Rate:  [83-101] 84  (06/04 0508) Resp:  [18] 18  (06/04 0508) BP: (131-152)/(72-86) 143/78 mmHg (06/04 0508) SpO2:  [94 %-98 %] 98 % (06/04 0508) Last BM Date: 08/10/11  Intake/Output from previous day: 06/03 0701 - 06/04 0700 In: 4533.7 [P.O.:360; I.V.:2728.3; IV Piggyback:10; TPN:1435.3] Out: 1375 [Urine:1375] Intake/Output this shift: Total I/O In: 2603.7 [I.V.:1678.3; IV Piggyback:10; TPN:915.3] Out: 525 [Urine:525]  General appearance: patient looks well. Mental status normal. No distress. GI: abdomen soft. Somewhat obese, protuberant, not really distended or tympanitic. I aspirated 500 cc of watery fluid from her stomach.  Lab Results:  Results for orders placed during the hospital encounter of 07/29/11 (from the past 24 hour(s))  GLUCOSE, CAPILLARY     Status: Abnormal   Collection Time   08/10/11  7:48 AM      Component Value Range   Glucose-Capillary 163 (*) 70 - 99 (mg/dL)   Comment 1 Notify RN    WET PREP, GENITAL     Status: Abnormal   Collection Time   08/10/11 10:24 AM      Component Value Range   Yeast Wet Prep HPF POC NONE SEEN  NONE SEEN    Trich, Wet Prep NONE SEEN  NONE SEEN    Clue Cells Wet Prep HPF POC NONE SEEN  NONE SEEN    WBC, Wet Prep HPF POC FEW (*) NONE SEEN   GLUCOSE, CAPILLARY     Status: Abnormal   Collection Time   08/10/11 12:04 PM      Component Value Range   Glucose-Capillary 138 (*) 70 - 99 (mg/dL)   Comment 1 Notify RN    GLUCOSE, CAPILLARY     Status:  Abnormal   Collection Time   08/10/11  4:21 PM      Component Value Range   Glucose-Capillary 175 (*) 70 - 99 (mg/dL)   Comment 1 Notify RN    GLUCOSE, CAPILLARY     Status: Abnormal   Collection Time   08/10/11  7:44 PM      Component Value Range   Glucose-Capillary 210 (*) 70 - 99 (mg/dL)  GLUCOSE, CAPILLARY     Status: Abnormal   Collection Time   08/10/11 11:42 PM      Component Value Range   Glucose-Capillary 214 (*) 70 - 99 (mg/dL)  GLUCOSE, CAPILLARY     Status: Abnormal   Collection Time   08/11/11  3:53 AM      Component Value Range   Glucose-Capillary 213 (*) 70 - 99 (mg/dL)     Studies/Results: @RISRSLT24 @     . alteplase  2 mg Intracatheter Once  . atenolol  25 mg Oral BID  . bisacodyl  10 mg Rectal Daily  . digoxin  0.25 mg Oral Daily  . diltiazem  180 mg Oral Daily  . enoxaparin (LOVENOX) injection  1.5 mg/kg Subcutaneous Q24H  . fluconazole  150 mg Oral Once  . FLUoxetine  20 mg Oral Daily  . insulin aspart  0-15 Units Subcutaneous Q4H  .  ketorolac  1 drop Right Eye BID  . levothyroxine  75 mcg Oral QAC breakfast  . lidocaine  20 mL Mouth/Throat Q4H  . magnesium sulfate LVP 250-500 ml  2 g Intravenous Once  . mulitivitamin with minerals  1 tablet Oral Daily  . pantoprazole (PROTONIX) IV  40 mg Intravenous Q24H  . potassium chloride  10 mEq Intravenous Q1 Hr x 4  . potassium chloride  40 mEq Oral Daily  . simvastatin  10 mg Oral q1800  . warfarin  5 mg Oral ONCE-1800  . Warfarin - Pharmacist Dosing Inpatient   Does not apply q1800  . DISCONTD: ciprofloxacin  400 mg Intravenous Q12H  . DISCONTD: digoxin  0.125 mg Intravenous Daily  . DISCONTD: levothyroxine  38 mcg Intravenous QAC breakfast  . DISCONTD: metoprolol  5 mg Intravenous Q6H     Assessment/Plan: s/p Procedure(s): LAPAROSCOPIC VENTRAL HERNIA INSERTION OF MESH  POD #1.  Gastric distention and ileus clinically are resolving. Because this has happened more than once will continue to be  cautious.  Check abdominal x-rays this morning. If unremarkable we will DC NG tube and advance diet. Continue IV Reglan  No narcotics   ADDENDUM:  Abdominal x-rays again and showed acute gastric distention and an ileus pattern. It is not clear whether she has a gastric outlet obstruction or gastroparesis or a more complex motility disorder. This is surprising, considering how well she feels clinically.  In the short-term we are going to put her back on to radical parenteral medications and I discussed this with the internal medicine teaching service. We'll put the NG tube back on suction and or a walker water-soluble upper GI.  I will call a lower GI to see if they have any other advised regarding her motility disorder.(discussed with Norm Parcel, PA)   Hypokalemia. Improving. Continue to push potassium supplementation to get potassium greater than 4.o. Kdur 40 meq daily ordered  Protein calorie malnutrition.  Continue TNA for now  Leukocytosis. Significance and etiology unclear.  Atrial fibrillation. Stable. On full dose Lovenox, Management per internal medicine service. Coumadin and Cardizem started   Type 2 diabetes. Reasonable control. Continue Lantus insulin scale insulin today. Resume oral medications  Hypothyroidism. TSH normal. On IV Synthroid.   Hypertension. Reasonable control  Chronic anxiety and depression. Clinically doing well emotionally. On Prozac   Somewhat deconditioned. HHPT, HHOT, HHN requested.     LOS: 13 days     M. Derrell Lolling, M.D., [facility] General and Minimally invasive Surgery Breast and Colorectal Surgery Office:   [phone] Pager:   [phone]  08/11/2011  . .prob

## 2011-08-11 NOTE — Progress Notes (Signed)
OT Cancellation Note  Treatment cancelled today due to medical issues with patient which prohibited therapy.  [facility] Marycruz Boehner, OTR/L  [phone] 08/11/2011 08/11/2011, 6:48 PM

## 2011-08-12 ENCOUNTER — Other Ambulatory Visit: Payer: Self-pay

## 2011-08-12 LAB — GLUCOSE, CAPILLARY
Glucose-Capillary: 138 mg/dL — ABNORMAL HIGH (ref 70–99)
Glucose-Capillary: 139 mg/dL — ABNORMAL HIGH (ref 70–99)
Glucose-Capillary: 142 mg/dL — ABNORMAL HIGH (ref 70–99)
Glucose-Capillary: 145 mg/dL — ABNORMAL HIGH (ref 70–99)
Glucose-Capillary: 149 mg/dL — ABNORMAL HIGH (ref 70–99)
Glucose-Capillary: 149 mg/dL — ABNORMAL HIGH (ref 70–99)

## 2011-08-12 LAB — BASIC METABOLIC PANEL
BUN: 8 mg/dL (ref 6–23)
CO2: 26 mEq/L (ref 19–32)
Calcium: 9.4 mg/dL (ref 8.4–10.5)
Chloride: 100 mEq/L (ref 96–112)
Creatinine, Ser: 0.46 mg/dL — ABNORMAL LOW (ref 0.50–1.10)
GFR calc non Af Amer: >90 mL/min (ref >90)
Glucose, Bld: 150 mg/dL — ABNORMAL HIGH (ref 70–99)
Sodium: 134 mEq/L — ABNORMAL LOW (ref 135–145)

## 2011-08-12 LAB — CBC
HCT: 37.9 % (ref 36.0–46.0)
MCHC: 33.8 g/dL (ref 30.0–36.0)
MCV: 93.1 fL (ref 78.0–100.0)
Platelets: 377 10*3/uL (ref 150–400)
RDW: 12.3 % (ref 11.5–15.5)
WBC: 9.7 10*3/uL (ref 4.0–10.5)

## 2011-08-12 LAB — PROTIME-INR
INR: 1.16 (ref 0.00–1.49)
Prothrombin Time: 15 seconds (ref 11.6–15.2)

## 2011-08-12 LAB — MAGNESIUM: Magnesium: 1.8 mg/dL (ref 1.5–2.5)

## 2011-08-12 LAB — CLOSTRIDIUM DIFFICILE BY PCR: Toxigenic C. Difficile by PCR: NEGATIVE

## 2011-08-12 MED ORDER — MAGNESIUM SULFATE 50 % IJ SOLN: 3.0000 g | Freq: Once | INTRAMUSCULAR | Status: DC

## 2011-08-12 MED ORDER — ZINC TRACE METAL 1 MG/ML IV SOLN
INTRAVENOUS | Status: AC
  Administered 2011-08-12: 18:00:00 via INTRAVENOUS
  Filled 2011-08-12: qty 2000

## 2011-08-12 MED ORDER — FAT EMULSION 20 % IV EMUL
240.0000 mL | INTRAVENOUS | Status: AC
  Administered 2011-08-12: 240 mL via INTRAVENOUS
  Filled 2011-08-12: qty 250

## 2011-08-12 MED ORDER — METOCLOPRAMIDE HCL 5 MG/ML IJ SOLN
10.0000 mg | Freq: Four times a day (QID) | INTRAMUSCULAR | Status: AC
  Administered 2011-08-12 – 2011-08-14 (×8): 10 mg via INTRAVENOUS
  Filled 2011-08-12 (×8): qty 2

## 2011-08-12 MED ORDER — DEXTROSE 5 % IV SOLN
3.0000 g | Freq: Once | INTRAVENOUS | Status: AC
  Administered 2011-08-12: 3 g via INTRAVENOUS
  Filled 2011-08-12: qty 6

## 2011-08-12 NOTE — Progress Notes (Signed)
Patient ID: [name], female   DOB: December 09, 1950, 61 y.o.   MRN: [id number] 14 Days Post-Op  Subjective: Patient states she does not feel well. She is having increased fatigue and malaise. She is having crampy abdominal pain and admits diarrhea for 2 days. She admits increasing nausea and NG is no longer clamped.   Objective: Vital signs in last 24 hours: Temp:  [98.3 F (36.8 C)-98.5 F (36.9 C)] 98.3 F (36.8 C) (06/05 0556) Pulse Rate:  [71-108] 71  (06/05 0556) Resp:  [17-20] 17  (06/05 0556) BP: (139-151)/(71-77) 151/77 mmHg (06/05 0556) SpO2:  [98 %-99 %] 98 % (06/05 0556) Last BM Date: 08/12/11  Intake/Output from previous day: 06/04 0701 - 06/05 0700 In: 4031 [I.V.:1914.7; NG/GT:550; TPN:1566.3] Out: 2600 [Urine:1550; Emesis/NG output:1050] Intake/Output this shift: Total I/O In: -  Out: 700 [Urine:700]  General appearance: mild distress Resp: clear to auscultation bilaterally Cardio: irregularly irregular rhythm GI: soft, non-tender; bowel sounds normal; no masses,  no organomegaly  Lab Results:  Results for orders placed during the hospital encounter of 07/29/11 (from the past 24 hour(s))  GLUCOSE, CAPILLARY     Status: Abnormal   Collection Time   08/11/11  5:17 PM      Component Value Range   Glucose-Capillary 157 (*) 70 - 99 (mg/dL)   Comment 1 Notify RN    GLUCOSE, CAPILLARY     Status: Abnormal   Collection Time   08/11/11  8:12 PM      Component Value Range   Glucose-Capillary 137 (*) 70 - 99 (mg/dL)  CLOSTRIDIUM DIFFICILE BY PCR     Status: Normal   Collection Time   08/11/11  8:54 PM      Component Value Range   C difficile by pcr NEGATIVE  NEGATIVE   GLUCOSE, CAPILLARY     Status: Abnormal   Collection Time   08/11/11 11:26 PM      Component Value Range   Glucose-Capillary 135 (*) 70 - 99 (mg/dL)  GLUCOSE, CAPILLARY     Status: Abnormal   Collection Time   08/12/11  3:49 AM      Component Value Range   Glucose-Capillary 142 (*) 70 - 99 (mg/dL)    PROTIME-INR     Status: Normal   Collection Time   08/12/11  4:45 AM      Component Value Range   Prothrombin Time 15.0  11.6 - 15.2 (seconds)   INR 1.16  0.00 - 1.49   MAGNESIUM     Status: Normal   Collection Time   08/12/11  4:45 AM      Component Value Range   Magnesium 1.8  1.5 - 2.5 (mg/dL)  BASIC METABOLIC PANEL     Status: Abnormal   Collection Time   08/12/11  4:45 AM      Component Value Range   Sodium 134 (*) 135 - 145 (mEq/L)   Potassium 4.1  3.5 - 5.1 (mEq/L)   Chloride 100  96 - 112 (mEq/L)   CO2 26  19 - 32 (mEq/L)   Glucose, Bld 150 (*) 70 - 99 (mg/dL)   BUN 8  6 - 23 (mg/dL)   Creatinine, Ser 4.09 (*) 0.50 - 1.10 (mg/dL)   Calcium 9.4  8.4 - 81.1 (mg/dL)   GFR calc non Af Amer >90  >90 (mL/min)   GFR calc Af Amer >90  >90 (mL/min)  CBC     Status: Normal   Collection Time   08/12/11  4:45 AM      Component Value Range   WBC 9.7  4.0 - 10.5 (K/uL)   RBC 4.07  3.87 - 5.11 (MIL/uL)   Hemoglobin 12.8  12.0 - 15.0 (g/dL)   HCT 08.6  57.8 - 46.9 (%)   MCV 93.1  78.0 - 100.0 (fL)   MCH 31.4  26.0 - 34.0 (pg)   MCHC 33.8  30.0 - 36.0 (g/dL)   RDW 62.9  52.8 - 41.3 (%)   Platelets 377  150 - 400 (K/uL)  PHOSPHORUS     Status: Normal   Collection Time   08/12/11  4:45 AM      Component Value Range   Phosphorus 3.8  2.3 - 4.6 (mg/dL)  GLUCOSE, CAPILLARY     Status: Abnormal   Collection Time   08/12/11  7:40 AM      Component Value Range   Glucose-Capillary 138 (*) 70 - 99 (mg/dL)  GLUCOSE, CAPILLARY     Status: Abnormal   Collection Time   08/12/11 12:11 PM      Component Value Range   Glucose-Capillary 139 (*) 70 - 99 (mg/dL)     Studies/Results: @RISRSLT24 @     . bisacodyl  10 mg Rectal Daily  . digoxin  0.125 mg Intravenous Daily  . enoxaparin (LOVENOX) injection  1.5 mg/kg Subcutaneous Q24H  . FLUoxetine  20 mg Oral Daily  . insulin aspart  0-15 Units Subcutaneous Q4H  . insulin glargine  10 Units Subcutaneous QHS  . ketorolac  1 drop Right Eye BID   . levothyroxine  50 mcg Intravenous Daily  . lidocaine  20 mL Mouth/Throat Q4H  . magnesium sulfate LVP 250-500 ml  2 g Intravenous Once  . magnesium sulfate 1 - 4 g bolus IVPB  3 g Intravenous Once  . metoCLOPramide (REGLAN) injection  10 mg Intravenous Q6H  . metoprolol  5 mg Intravenous Q6H  . pantoprazole (PROTONIX) IV  40 mg Intravenous Q24H  . potassium chloride  40 mEq Oral Daily  . prednisoLONE acetate  1 drop Right Eye BID  . simvastatin  10 mg Oral q1800  . DISCONTD: magnesium sulfate LVP 250-500 ml  3 g Intravenous Once  . DISCONTD: mulitivitamin with minerals  1 tablet Oral Daily     Assessment/Plan: s/p Procedure(s): LAPAROSCOPIC VENTRAL HERNIA INSERTION OF MESH  POD #1.  Gastric distention and ileus clinically are resolving, but worsening symptoms today. NG tube back to suction, continuing parenteral medications. Continue IV Reglan  No narcotics   Hypokalemia. Improving. Continue to push potassium supplementation to get potassium greater than 4.o. Kdur 40 meq daily ordered  Protein calorie malnutrition.  Continue TNA for now  Leukocytosis. WBC 9.7 today  Atrial fibrillation. Stable. On full dose Lovenox, Management per internal medicine service. Coumadin and Cardizem started   Type 2 diabetes. Reasonable control. Continue Lantus insulin scale insulin today. Resume oral medications  Hypothyroidism. TSH normal. On IV Synthroid.   Hypertension. Reasonable control   Chronic anxiety and depression. Clinically doing well emotionally. On Prozac   Somewhat deconditioned. HHPT, HHOT, HHN requested.  Increased malaise and fatigue: 12 lead EKG ordered due to increased irregularity of cardiac sounds     LOS: 14 days   [name] 08/12/2011 1:07 PM

## 2011-08-12 NOTE — Progress Notes (Signed)
Patient seen, examined, and I agree with the above documentation, including the assessment and plan. Discussed with Dr. Derrell Lolling today, and patient has now regressed again (3rd time since surgery) requiring NGT decompression. UGI series neg for obstruction, GOO.  Certainly gastroparesis could be contributing, but small bowel is dilated also which is not consistent with gastroparesis alone.  At this point, I think she needs more time, but will add back reglan IV to boost gut motility.  Will also consider IV erythromycin to "jump-start" gastric motility if no response to reglan. Ambulation encouraged NGT was to high-intermittent suction and I decreased this to low-intermittent (order written for low-int) KUB in the am

## 2011-08-12 NOTE — Progress Notes (Signed)
Pt reviewed in hospital LOS meeting today.  

## 2011-08-12 NOTE — Progress Notes (Addendum)
Patient is interviewed and examined. I agree with the assessment and plans outlined by [facility] white, PA.  Discussed  her care with Dr. Rhea Belton.  She feels more fatigued today but denies pain. She is still having diarrhea.  Her abdomen is soft and nontender.  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 There is nothing surgically that can be done except for supportive care. Dr. Rhea Belton is going to try other prokinetic agent such as erythromycin. If this does not work she may have to be transferred to an [facility] setting for further management. I have discussed this with her.  Check C.Diff PCR. (this is negative on 08/12/2011)   Angelia Mould. Derrell Lolling, M.D., [facility] General and Minimally invasive Surgery Breast and Colorectal Surgery Office:   [phone] Pager:   [phone]

## 2011-08-12 NOTE — Progress Notes (Addendum)
Subjective:  Patient states that she feels better this morning.  No nausea, vomiting or abdomen pain since NG tube connected to wall suction yesterday.  She reports 3 loose watery stools starting yesterday. Stool C diff PCR negative.   Objective: Vital signs in last 24 hours: Filed Vitals:   08/11/11 1011 08/11/11 1417 08/11/11 2207 08/12/11 0556  BP: 146/80 139/71 151/74 151/77  Pulse: 102 104 108 71  Temp: 97.5 F (36.4 C) 98.4 F (36.9 C) 98.5 F (36.9 C) 98.3 F (36.8 C)  TempSrc:   Oral Oral  Resp: 18 20 18 17   Height:      Weight:      SpO2: 97% 98% 99% 98%   Weight change:   Intake/Output Summary (Last 24 hours) at 08/12/11 0819 Last data filed at 08/12/11 0600  Gross per 24 hour  Intake   4031 ml  Output   2600 ml  Net   1431 ml   Physical exam  General: NAD. NGT to suction Cardiac: irregularly irregular rhythm, no M/G/R  Pulm: CTA B/L  Abd: Soft, active BS x 4. Non tender. No distention . Multiple laparoscopic incisions healing well.  Ext: warm and well perfused, no pedal edema. No tenderness or swelling. No erythema.  Neuro: alert and oriented X3, cranial nerves II-XII grossly intact   Lab Results: Basic Metabolic Panel:  Lab 08/12/11 1610 08/11/11 0655 08/10/11 0603  NA 134* 131* --  K 4.1 4.3 --  CL 100 97 --  CO2 26 22 --  GLUCOSE 150* 204* --  BUN 8 8 --  CREATININE 0.46* 0.39* --  CALCIUM 9.4 9.4 --  MG 1.8 1.8 --  PHOS 3.8 -- 3.5   Liver Function Tests:  Lab 08/10/11 0603 08/07/11 0520  AST 25 19  ALT 30 36*  ALKPHOS 93 92  BILITOT 0.5 0.4  PROT 6.7 6.0  ALBUMIN 2.7* 2.5*   CBC:  Lab 08/12/11 0445 08/10/11 0603 08/07/11 0520  WBC 9.7 13.5* --  NEUTROABS -- 9.4* 6.0  HGB 12.8 13.3 --  HCT 37.9 39.4 --  MCV 93.1 93.8 --  PLT 377 397 --   CBG:  Lab 08/12/11 0740 08/12/11 0349 08/11/11 2326 08/11/11 2012 08/11/11 1717 08/11/11 1217  GLUCAP 138* 142* 135* 137* 157* 182*   Fasting Lipid Panel:  Lab 08/10/11 0603  CHOL 112    HDL --  LDLCALC --  TRIG 199*  CHOLHDL --  LDLDIRECT --   Coagulation:  Lab 08/12/11 0445 08/11/11 0655 08/10/11 0603 08/09/11 0605  LABPROT 15.0 15.0 13.6 13.8  INR 1.16 1.16 1.02 1.04   Urinalysis:  Lab 08/06/11 0900  COLORURINE AMBER*  LABSPEC 1.031*  PHURINE 5.5  GLUCOSEU NEGATIVE  HGBUR NEGATIVE  BILIRUBINUR SMALL*  KETONESUR NEGATIVE  PROTEINUR 30*  UROBILINOGEN 1.0  NITRITE NEGATIVE  LEUKOCYTESUR SMALL*    Micro Results: Recent Results (from the past 240 hour(s))  URINE CULTURE     Status: Normal   Collection Time   08/06/11  9:00 AM      Component Value Range Status Comment   Specimen Description URINE, CLEAN CATCH   Final    Special Requests NONE   Final    Culture  Setup Time [id number]   Final    Colony Count 55,000 COLONIES/ML   Final    Culture     Final    Value: Multiple bacterial morphotypes present, none predominant. Suggest appropriate recollection if clinically indicated.   Report Status 08/07/2011 FINAL  Final   WET PREP, GENITAL     Status: Abnormal   Collection Time   08/10/11 10:24 AM      Component Value Range Status Comment   Yeast Wet Prep HPF POC NONE SEEN  NONE SEEN  Final SPECIMEN SUBMITTED IN 2 mls OF SALINE   Trich, Wet Prep NONE SEEN  NONE SEEN  Final    Clue Cells Wet Prep HPF POC NONE SEEN  NONE SEEN  Final    WBC, Wet Prep HPF POC FEW (*) NONE SEEN  Final   CLOSTRIDIUM DIFFICILE BY PCR     Status: Normal   Collection Time   09/08/11  8:54 PM      Component Value Range Status Comment   C difficile by pcr NEGATIVE  NEGATIVE  Final    Studies/Results: Dg Abd 2 Views  2011-09-08  *RADIOLOGY REPORT*  Clinical Data: Abdominal distension, recurring ileus  ABDOMEN - 2 VIEW  Comparison: 08/09/2011; 08/08/2011; chest radiograph - 08/06/2011  Findings:  An enteric tube overlies the left upper abdominal quadrant. Interval increased gaseous distension of multiple loops of large and small bowel.  Interval passage of previously identified  enteric contrast.  No pneumoperitoneum, pneumatosis or portal venous gas. Post cholecystectomy.  Limited visualization of the lower thorax demonstrates the tip of a right upper extremity PICC line overlying the superior aspect of the right atrium.  There is mild elevation of the left hemidiaphragm.  No acute osseous abnormalities.  IMPRESSION: Increased gaseous distension of multiple loops of large and small bowel most suggestive of ileus.  Original Report Authenticated By: Waynard Reeds, M.D.   Dg Kayleen Memos W/water Sol Cm  2011/09/08  *RADIOLOGY REPORT*  Clinical Data:  Diabetic with recurrent emesis, gastric and small bowel distension.  Question gastric obstruction or gastroparesis.  UPPER GI SERIES WITHOUT KUB  Technique:  Routine upper GI series was performed with water- soluble contrast (300 ml Omnipaque-300) per nasogastric tube.  Fluoroscopy Time: 0.55 minutes  Comparison:  Abdominal radiographs same date.  Abdominal pelvic CT 08/06/2011.  Findings: The radiographs obtained earlier today demonstrate moderate distension of the stomach and proximal small bowel.  There is mild distension of the distal small bowel and colon.  The stomach was filled easily.  Turning the patient erect and into the right lateral decubitus positions demonstrates no mucosal ulceration or wall thickening.  Within the right lateral decubitus position, there is rapid emptying into a moderately distended duodenum.  The duodenum demonstrates uniform fold thickening.  No focal mucosal ulceration is identified. Contrast passes between the superior mesenteric artery and the aorta.  The fourth portion of the duodenum and proximal jejunum are also mildly distended.  No focal transition point or extravasation is seen.  The esophagus is not evaluated by this examination performed per nasogastric tube.  No reflux was seen.  IMPRESSION:  1.  No evidence of gastric outlet obstruction. 2.  The stomach and proximal small bowel are moderately distended  without demonstrated focal transition point.  Recent CT demonstrated a relatively gradual transition to normal caliber distal small bowel such that mechanical obstruction remains unlikely.  Radiographic followup suggested. 3.  Uniform wall thickening of the duodenum likely represents edema.  Original Report Authenticated By: Gerrianne Scale, M.D.   Medications: I have reviewed the patient's current medications. Scheduled Meds:    . bisacodyl  10 mg Rectal Daily  . digoxin  0.125 mg Intravenous Daily  . enoxaparin (LOVENOX) injection  1.5 mg/kg Subcutaneous Q24H  .  FLUoxetine  20 mg Oral Daily  . insulin aspart  0-15 Units Subcutaneous Q4H  . insulin glargine  10 Units Subcutaneous QHS  . ketorolac  1 drop Right Eye BID  . levothyroxine  50 mcg Intravenous Daily  . lidocaine  20 mL Mouth/Throat Q4H  . magnesium sulfate LVP 250-500 ml  2 g Intravenous Once  . magnesium sulfate LVP 250-500 ml  3 g Intravenous Once  . metoprolol  5 mg Intravenous Q6H  . mulitivitamin with minerals  1 tablet Oral Daily  . pantoprazole (PROTONIX) IV  40 mg Intravenous Q24H  . potassium chloride  40 mEq Oral Daily  . prednisoLONE acetate  1 drop Right Eye BID  . simvastatin  10 mg Oral q1800  . DISCONTD: atenolol  25 mg Oral BID  . DISCONTD: digoxin  0.25 mg Oral Daily  . DISCONTD: diltiazem  180 mg Oral Daily  . DISCONTD: levothyroxine  75 mcg Oral QAC breakfast  . DISCONTD: warfarin  5 mg Oral ONCE-1800  . DISCONTD: Warfarin - Pharmacist Dosing Inpatient   Does not apply q1800   Continuous Infusions:    . 0.9 % NaCl with KCl 40 mEq / L 100 mL/hr at 08/12/11 0020  . fat emulsion 250 mL (08/10/11 1744)  . TPN (CLINIMIX) +/- additives 65 mL/hr at 08/10/11 1744  . TPN (CLINIMIX) +/- additives 65 mL/hr at 08/11/11 1743  . DISCONTD: dextrose 5 % and 0.45 % NaCl with KCl 20 mEq/L 100 mL/hr at 08/11/11 0405   PRN Meds:.albuterol, iohexol, ketorolac, magic mouthwash, nystatin, ondansetron (ZOFRAN) IV,  promethazine, sodium chloride Assessment/Plan:  # Post Op ileus: Ventral hernia s/p laparoscopic lysis of adhesions and hernia repair with mesh 07/29/11.  Ileus waxing and waning, worsening yesterday after she was advanced to full liquid diet.  Abd x ray>>> "Increased gaseous distension of multiple loops of large and small bowel most suggestive of ileus.".  UGI series>>>No evidence of gastric outlet obstruction. The stomach and proximal small bowel are moderately distended without demonstrated focal transition point. Uniform wall thickening of the duodenum likely represents edema.  -NPO , TPN, NG to suction per Dr. Derrell Lolling.  - IV mediations for medical problems. - [facility] re consulted - Etiology of her persistent ileus is not completely clear. ? Postoperative ileus Gastroparesis could contribute some. Will discuss with Radiology about the film. No transition point suggest obstruction.    # Diarrhea    3 watery diarrhea yesterday.  - C-diff negative - will hold on medical treatment such as imodium given her ileus.  # Vaginal itching , better  No discharge.  - wet prep genital and vaginal negtaive  - likely yeast infection>>diflucan 150 mg po x1 on 08/10/11   # Hypomagnesemia and hypokalemia  - K 4.1 and Mg 1.8  - Mg eplaced this morning  - follow up BMP and Mg in am   # Leukocytosis:resolved  ? De margination vs ileus / postoperative state vs possible UTI.  -UA Mild pyuria, rare bacteria, negative nitrates  -Urine Cx>>>indicated non clean catch.  - cipro for short course for presumed UTI>>>D/C'd   Afib: Stable, rates ~100 bpm at goal on atenolol 25mg  PO BID, Diltiazem XR 180mg  PO daily, Digoxin 0.25 mg daily.  - full dose lovenox  - switch to IV meds  - coumadin on hold   DM2: Well controlled. A1c- 6.3.  -on TPN.  - Continue lantus and SSI>>>CBG good control   Hypothyroidism: TSH- 0.756. Normal.  - synthroid IV  Hypertension: BP up an down but control ok overall.    -Lopressor IV  -hold Lisinopril for now   Hx of TIA/CVA: Unclear when pt had it. She refuses any Hx though.  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 This was discussed by Dr. Dierdre Searles with Dr. Pearlean Brownie with Neurology.  -Lovenox full dose in place of warfarin while NPO.   Depression: Continue PO Prozac if she can swallow it to avoid SSRI withdrawal  - hold gabapentin and amitriptyline for now since on narcotics for pain. Restart these on discharge.   GERD: Will convert Protonix to IV while NPO with NGT.   DVT Prophylaxis: Lovenox    LOS: 14 days   LI, NA 08/12/2011, 8:19 AM  Internal Medicine Teaching Service Attending Note Date: 08/12/2011  Patient name: [name]  Medical record number: [id number]  Date of birth: 1951/03/03    This patient has been seen and discussed with the house staff. Please see their note for complete details. I concur with their findings with the following additions/corrections: We reviewed all of her films with radiology today. We do wonder if she might have partial small bowel obstruction given her adhesions in her abdomen given her very slow to respond and rather recalcitrant process. We spent greater than 45 minutes with the patient including greater than 50% of time in face to face counsel of the patient and in coordination of their care.   Paulette Blanch Dam 08/12/2011, 11:59 AM

## 2011-08-12 NOTE — Progress Notes (Signed)
Gi Daily Rounding Note 08/12/2011, 11:16 AM  SUBJECTIVE:       Belly less distended this AM.  Small watery stool this AM.  Some flatus as well.    OBJECTIVE:        General: looks well     Vital signs in last 24 hours:    Temp:  [98.3 F (36.8 C)-98.5 F (36.9 C)] 98.3 F (36.8 C) (06/05 0556) Pulse Rate:  [71-108] 71  (06/05 0556) Resp:  [17-20] 17  (06/05 0556) BP: (139-151)/(71-77) 151/77 mmHg (06/05 0556) SpO2:  [98 %-99 %] 98 % (06/05 0556) Last BM Date: 08/12/11  Heart: RRR Chest: CTA bil, not SOB Abdomen: soft, less distended, hypoactive BS, NT.  Contents of NGT are dark/maroon but the material in the line from the nose, headed to canister, is watery with some very light brown sediment, not bloody or melenic at all. Extremities: no pedal edema Neuro/Psych:  Pe[place], relaxed.  Fully oriented and moving all limbs.  Intake/Output from previous day: 06/04 0701 - 06/05 0700 In: 4031 [I.V.:1914.7; NG/GT:550; TPN:1566.3] Out: 2600 [Urine:1550; Emesis/NG output:1050]  Intake/Output this shift:    Lab Results:  Basename 08/12/11 0445 08/10/11 0603  WBC 9.7 13.5*  HGB 12.8 13.3  HCT 37.9 39.4  PLT 377 397   BMET  Basename 08/12/11 0445 08/11/11 0655 08/10/11 0603  NA 134* 131* 135  K 4.1 4.3 3.8  CL 100 97 98  CO2 26 22 26   GLUCOSE 150* 204* 173*  BUN 8 8 8   CREATININE 0.46* 0.39* 0.49*  CALCIUM 9.4 9.4 9.5   LFT  Basename 08/10/11 0603  PROT 6.7  ALBUMIN 2.7*  AST 25  ALT 30  ALKPHOS 93  BILITOT 0.5  BILIDIR --  IBILI --   PT/INR  Basename 08/12/11 0445 08/11/11 0655  LABPROT 15.0 15.0  INR 1.16 1.16    Studies/Results:  Dg Ugi W/water Sol Cm 08/11/2011  *RADIOLOGY REPORT*  Clinical Data:  Diabetic with recurrent emesis, gastric and small bowel distension.  Question gastric obstruction or gastroparesis.  UPPER GI SERIES WITHOUT KUB  Technique:  Routine upper GI series was performed with water- soluble contrast (300 ml Omnipaque-300)  per nasogastric tube.  Fluoroscopy Time: 0.55 minutes  Comparison:  Abdominal radiographs same date.  Abdominal pelvic CT 08/06/2011.  Findings: The radiographs obtained earlier today demonstrate moderate distension of the stomach and proximal small bowel.  There is mild distension of the distal small bowel and colon.  The stomach was filled easily.  Turning the patient erect and into the right lateral decubitus positions demonstrates no mucosal ulceration or wall thickening.  Within the right lateral decubitus position, there is rapid emptying into a moderately distended duodenum.  The duodenum demonstrates uniform fold thickening.  No focal mucosal ulceration is identified. Contrast passes between the superior mesenteric artery and the aorta.  The fourth portion of the duodenum and proximal jejunum are also mildly distended.  No focal transition point or extravasation is seen.  The esophagus is not evaluated by this examination performed per nasogastric tube.  No reflux was seen.  IMPRESSION:  1.  No evidence of gastric outlet obstruction. 2.  The stomach and proximal small bowel are moderately distended without demonstrated focal transition point.  Recent CT demonstrated a relatively gradual transition to normal caliber distal small bowel such that mechanical obstruction remains unlikely.  Radiographic followup suggested. 3.  Uniform wall thickening of the duodenum likely represents edema.  Original Report Authenticated  By: Gerrianne Scale, M.D.    ASSESMENT: * Post op ileus SB, GOO post lap ventral hernia repairs and extensive LOA 5/22.  Increased large and small bowel ileus by films 6/4.  Ileus and no obstruction on GG study 6/4.  NPO and receiving TNA. *  Bloody appearing NGT material, suspect bleeding from NGT trauma, it is currently non-bloody. On once daily IV Protonix.  * Remote repair of HH. Subsequent c/o dysphagia but no strictures, has been empirically dilated.On Omeprazole PTA, IV protonix  currently.  *  Hyponatremia, mild.  * IDDM  * Renal insufficiency. subtherapeutic INR  * Fatty liver, has had elevated transaminases in past.  * A fib, chronic coumadin  * Chronic pain Syndrome, fibromyalgia. Seems well controlled without narcotics currently.  * Polypharmacy. List of 25 meds/supplements on PTA med list.    PLAN: *  Will add Reglan IV for 2 days, trial to see if it helps.   *  Could restart clears *  Surgery to decide when NGT can come out.  She will probably need another KUB beforehand.    LOS: 14 days   Jennye Moccasin  08/12/2011, 11:16 AM Pager: [phone]

## 2011-08-12 NOTE — Progress Notes (Signed)
PARENTERAL NUTRITION CONSULT NOTE - FOLLOW UP  Pharmacy Consult for TPN Indication: adynamic Ileus s/p ventral hernia repair   Allergies  Allergen Reactions  . Gemfibrozil Swelling    REACTION: Angioedema  . Latex Other (See Comments)    Blisters where touched or applied  . Penicillins Hives    Will spread in patches all over the body.  . Adhesive (Tape) Other (See Comments)    Will blister skin where applied - do not use BAND-AIDS.  Marland Kitchen Codeine Hives    Will spread in patches all over the body.    Patient Measurements: Height: 5\' 3"  (160 cm) Weight: 193 lb 14.4 oz (87.952 kg) IBW/kg (Calculated) : 52.4  Adjusted Body Weight: 63kg BMI: 34  Vital Signs: Temp: 98.3 F (36.8 C) (06/05 0556) Temp src: Oral (06/05 0556) BP: 151/77 mmHg (06/05 0556) Pulse Rate: 71  (06/05 0556) Intake/Output from previous day: 06/04 0701 - 06/05 0700 In: 4031 [I.V.:1914.7; NG/GT:550; TPN:1566.3] Out: 2600 [Urine:1550; Emesis/NG output:1050] Intake/Output from this shift:    Labs:  [facility] 08/12/11 0445 08/11/11 0655 08/10/11 0603  WBC 9.7 -- 13.5*  HGB 12.8 -- 13.3  HCT 37.9 -- 39.4  PLT 377 -- 397  APTT -- -- --  INR 1.16 1.16 1.02     Basename 08/12/11 0445 08/11/11 0655 08/10/11 0603  NA 134* 131* 135  K 4.1 4.3 3.8  CL 100 97 98  CO2 26 22 26   GLUCOSE 150* 204* 173*  BUN 8 8 8   CREATININE 0.46* 0.39* 0.49*  LABCREA -- -- --  CREAT24HRUR -- -- --  CALCIUM 9.4 9.4 9.5  MG 1.8 1.8 1.8  PHOS 3.8 -- 3.5  PROT -- -- 6.7  ALBUMIN -- -- 2.7*  AST -- -- 25  ALT -- -- 30  ALKPHOS -- -- 93  BILITOT -- -- 0.5  BILIDIR -- -- --  IBILI -- -- --  PREALBUMIN -- -- 19.8  TRIG -- -- 199*  CHOLHDL -- -- --  CHOL -- -- 112   Estimated Creatinine Clearance: 75.7 ml/min (by C-G formula based on Cr of 0.46).    Basename 08/12/11 0740 08/12/11 0349 08/11/11 2326  GLUCAP 138* 142* 135*   Medications:  Scheduled:     . bisacodyl  10 mg Rectal Daily  . digoxin  0.125 mg  Intravenous Daily  . enoxaparin (LOVENOX) injection  1.5 mg/kg Subcutaneous Q24H  . FLUoxetine  20 mg Oral Daily  . insulin aspart  0-15 Units Subcutaneous Q4H  . insulin glargine  10 Units Subcutaneous QHS  . ketorolac  1 drop Right Eye BID  . levothyroxine  50 mcg Intravenous Daily  . lidocaine  20 mL Mouth/Throat Q4H  . magnesium sulfate LVP 250-500 ml  2 g Intravenous Once  . magnesium sulfate 1 - 4 g bolus IVPB  3 g Intravenous Once  . metoprolol  5 mg Intravenous Q6H  . mulitivitamin with minerals  1 tablet Oral Daily  . pantoprazole (PROTONIX) IV  40 mg Intravenous Q24H  . potassium chloride  40 mEq Oral Daily  . prednisoLONE acetate  1 drop Right Eye BID  . simvastatin  10 mg Oral q1800  . DISCONTD: magnesium sulfate LVP 250-500 ml  3 g Intravenous Once   Infusions:     . 0.9 % NaCl with KCl 40 mEq / L 100 mL/hr at 08/12/11 0020  . fat emulsion 250 mL (08/10/11 1744)  . TPN (CLINIMIX) +/- additives 65 mL/hr at 08/10/11 1744  .  TPN (CLINIMIX) +/- additives 65 mL/hr at 08/11/11 1743   PRN: albuterol, iohexol, ketorolac, magic mouthwash, nystatin, ondansetron (ZOFRAN) IV, promethazine, sodium chloride  Insulin Requirements in the past 24 hours:  6 units SSI required with 60 units insulin / BAG TNA, moderate SSI q4h, lantus 10 units QHS (added 6/4).  Current Nutrition:  Clinimix E5/20 at 79ml/hr (goal - provides weekly average of 1578 kcal and 78g protein/day) NPO  Nutritional Goals:  1750-1970 kCal, 62-74 grams of protein per day  Assessment: 72 yof s/p ventral hernia repair (5/22) with CT abdomin showing adynamic ileus - etiology unclear - ?2/2 surgery or possible diabetic gastroparesis.  UGI showed no evidence of gastric outlet obstruction. NGT placed 5/30 with significant amount of bilious drainage. TNA started 5/30. Pt with positive flatus and bowel movements. PO meds changed back to IV due to worsening ileus. Plan to continue TPN, NG clamped.  Anticoag:  Chronic  coumadin for Afib - coumadin on hold.  Full dose LMWH 1.5mg /kg/day.  CBC stable. Hx TIA/CVA.  GI: Post op day #14 s/p ventral hernia repair. CT abdomin showing adynamic ileus +/- gastroparesis/GOO. Pt passing flatus/water diarrhea and tolerating liquids but abdomen is more distended. NGT clamped.  HX GERD/gastritis on ppi. Reglan/bisacodyl on board - Prealbumin improved to 19.8. Endo: Hx Hypothyroidism/DM on synthroid IV (higher than home dose) and TSH wnl. HbA1c 6.3. PO metformin/glipizide on hold d/t inability to swallow.  D/t elevated cBGs, lantus 10units QHS added 6/4 in addition to SSI and insulin in TNA.  cBGs now at goal, will have to f/u closely to assess insulin needs in TNA.    Lytes: K ok (NPO - not receiving scheduled daily KCl ); Mag remains low at 1.8 (goal of >4/2, respectively) despite 2gm repletion 6/4.  Will receive additional 3gm bolus 6/5. Phos, corr Ca ok.  Renal: Scr stable, UOP continues at 0.7 ml/kg/hr.  Pulm: RA  Cards: Hx Afib/HTN/HLD/CHF (no recent echo), RICA 40-59% stenosed, but no asa per neuro w/ hx TIA/CVA.  BP slightly elevated, tachycardic on IV digoxin, metoprolol, statin  (atenolol, diltiazem, lisinopril, pta po meds on hold).  On full dose lovenox (coumadin on hold).   Hepatobil: Baseline Alk phos/LFTs WNL, Trigs increased today at 199 - will follow Neuro: Hx of depression/anxiety on fluoxetine (amytripyline on hold)  ID: Afebrile, WBC decreased to normal, s/p 4 days Cipro for UTI, 1 day fluconzole for yeast infx.    Urine Cx 5/30: 55,000 colonies, Mx spp. Wet prep 6/3: No trick/clue cells, few WBCs CDiff PCR 6/4: negative Cipro 5/30>>6/3 Fluconazole 150mg  x 1: 6/3   Best Practices: Lovenox, po ppi, home meds resumed   Plan:  -Continue Clinimix E5/20 at 103ml/hr (goal)  -IV trace elements, and lipids 20% MWF due to national shortage  -Change mvi back to IV since patient not taking po meds -Continue 60 units insulin in TNA -F/u CBGs closely to assess  TNA insulin requirments d/t addition of basal insulin -f/u Mag, K s/p MD repletion today -f/u am BMP, CBGs, Mg, Phos  Haynes Hoehn, PharmD 08/12/2011 10:27 AM  Pager: [phone]

## 2011-08-12 NOTE — Progress Notes (Signed)
Pt. Looking flushed without diaphoresis and feeling general malaise. Clance Boll, PA told during rounding about heart rhythm seeming more irregular than the morning.  Vitals stable, will continue to monitor.

## 2011-08-12 NOTE — Progress Notes (Signed)
OT Cancellation Note  Treatment cancelled today due to patient's refusal to participate secondary to continued nausea and fatigue. Will attempt to check back later as schedule allows. Thank you. [name], OTR/L Pager: [phone] 08/12/2011    [name] 08/12/2011, 9:22 AM

## 2011-08-13 ENCOUNTER — Inpatient Hospital Stay (HOSPITAL_COMMUNITY): Payer: Medicare Other

## 2011-08-13 LAB — COMPREHENSIVE METABOLIC PANEL
AST: 19 U/L (ref 0–37)
Albumin: 2.8 g/dL — ABNORMAL LOW (ref 3.5–5.2)
Alkaline Phosphatase: 92 U/L (ref 39–117)
BUN: 8 mg/dL (ref 6–23)
CO2: 25 mEq/L (ref 19–32)
Calcium: 9.5 mg/dL (ref 8.4–10.5)
Creatinine, Ser: 0.46 mg/dL — ABNORMAL LOW (ref 0.50–1.10)
GFR calc Af Amer: >90 mL/min (ref >90)
GFR calc non Af Amer: >90 mL/min (ref >90)
Glucose, Bld: 141 mg/dL — ABNORMAL HIGH (ref 70–99)
Potassium: 4 mEq/L (ref 3.5–5.1)
Total Protein: 6.9 g/dL (ref 6.0–8.3)

## 2011-08-13 LAB — GLUCOSE, CAPILLARY
Glucose-Capillary: 118 mg/dL — ABNORMAL HIGH (ref 70–99)
Glucose-Capillary: 139 mg/dL — ABNORMAL HIGH (ref 70–99)
Glucose-Capillary: 140 mg/dL — ABNORMAL HIGH (ref 70–99)
Glucose-Capillary: 148 mg/dL — ABNORMAL HIGH (ref 70–99)

## 2011-08-13 LAB — PROTIME-INR: Prothrombin Time: 14 seconds (ref 11.6–15.2)

## 2011-08-13 LAB — PHOSPHORUS: Phosphorus: 4.2 mg/dL (ref 2.3–4.6)

## 2011-08-13 LAB — MAGNESIUM: Magnesium: 1.8 mg/dL (ref 1.5–2.5)

## 2011-08-13 MED ORDER — INSULIN REGULAR HUMAN 100 UNIT/ML IJ SOLN
INTRAMUSCULAR | Status: AC
  Administered 2011-08-13: 18:00:00 via INTRAVENOUS
  Filled 2011-08-13: qty 2000

## 2011-08-13 MED ORDER — MAGNESIUM SULFATE 40 MG/ML IJ SOLN
4.0000 g | Freq: Once | INTRAMUSCULAR | Status: AC
  Administered 2011-08-13: 4 g via INTRAVENOUS
  Filled 2011-08-13: qty 100

## 2011-08-13 NOTE — Progress Notes (Signed)
Physical Therapy Treatment Patient Details Name: [name] MRN: [id number] DOB: [birth year] Today's Date: 08/13/2011 Time: 1478-2956 PT Time Calculation (min): 23 min  PT Assessment / Plan / Recommendation Comments on Treatment Session  steady with cane; will try to progress away.    Follow Up Recommendations  Home health PT    Barriers to Discharge        Equipment Recommendations  Cane;3 in 1 bedside comode    Recommendations for Other Services    Frequency Min 3X/week   Plan Discharge plan remains appropriate;Frequency remains appropriate    Precautions / Restrictions Precautions Precautions: Fall Precaution Comments: NGT   Pertinent Vitals/Pain     Mobility  Bed Mobility Bed Mobility: Supine to Sit;Sit to Supine Supine to Sit: 6: Modified independent (Device/Increase time) Sit to Supine: 6: Modified independent (Device/Increase time) Transfers Transfers: Sit to Stand;Stand to Sit Sit to Stand: 5: Supervision;With upper extremity assist;From bed;From toilet Stand to Sit: 5: Supervision;With upper extremity assist;With armrests;To chair/3-in-1;To toilet Details for Transfer Assistance: reinforcing safety/hand placement Ambulation/Gait Ambulation/Gait Assistance: 5: Supervision Ambulation Distance (Feet): 200 Feet Assistive device: Straight cane Ambulation/Gait Assistance Details: steady gait, good use of cane. scanned her environment without incident Gait Pattern: Within Functional Limits Gait velocity: slowed, but able to vary on cue Stairs: Yes Stairs Assistance: 5: Supervision Stairs Assistance Details (indicate cue type and reason): safe with use of the rail and supervision Stair Management Technique: Step to pattern;One rail Right;Forwards Number of Stairs: 3  Wheelchair Mobility Wheelchair Mobility: No    Exercises     PT Diagnosis:    PT Problem List:   PT Treatment Interventions:     PT Goals Acute Rehab PT Goals PT Goal Formulation: With  patient Time For Goal Achievement: 08/14/11 Potential to Achieve Goals: Good PT Goal: Rolling Supine to Right Side - Progress: Met PT Goal: Rolling Supine to Left Side - Progress: Met PT Goal: Supine/Side to Sit - Progress: Progressing toward goal PT Goal: Sit to Supine/Side - Progress: Progressing toward goal PT Goal: Sit to Stand - Progress: Progressing toward goal PT Goal: Stand to Sit - Progress: Progressing toward goal PT Transfer Goal: Bed to Chair/Chair to Bed - Progress: Progressing toward goal PT Goal: Ambulate - Progress: Progressing toward goal PT Goal: Up/Down Stairs - Progress: Met Pt will Perform Home Exercise Program: Independently  Visit Information  Last PT Received On: 08/13/11 Assistance Needed: +1    Subjective Data  Subjective: Oh, I feel so much better, Yesterday and day before were bad. Patient Stated Goal: home   Cognition  Overall Cognitive Status: Appears within functional limits for tasks assessed/performed Arousal/Alertness: Awake/alert Orientation Level: Appears intact for tasks assessed Behavior During Session: [facility] for tasks performed    Balance  Static Sitting Balance Static Sitting - Level of Assistance: 6: Modified independent (Device/Increase time) Dynamic Standing Balance Dynamic Standing - Level of Assistance: 5: Stand by assistance  End of Session PT - End of Session Activity Tolerance: Patient tolerated treatment well Patient left: in bed;with call bell/phone within reach Nurse Communication: Mobility status    Pink Maye, Eliseo Gum 08/13/2011, 2:37 PM  08/13/2011  Fisher Bing, PT [phone] (pager)

## 2011-08-13 NOTE — Progress Notes (Signed)
Internal Medicine Teaching Service Attending Note Date: 08/13/2011  Patient name: [name]  Medical record number: [id number]  Date of birth: 12/30/1950    This patient has been seen and discussed with the house staff. Please see their note for complete details. I concur with their findings with the following additions/corrections: 61 year old with slow to resolve postoperative ileus, gastric distention. CCS feels there is no evidence for SBO and feel this is ileus plus gastric motility issue. GI also consulting and have started reglan and are consiering other prokinetics. This am patient states she has already had 3 loose bowel movements.  Acey Lav 08/13/2011, 12:23 PM

## 2011-08-13 NOTE — Progress Notes (Signed)
PARENTERAL NUTRITION CONSULT NOTE - FOLLOW UP  Pharmacy Consult for TPN Indication: adynamic Ileus s/p ventral hernia repair   Allergies  Allergen Reactions  . Gemfibrozil Swelling    REACTION: Angioedema  . Latex Other (See Comments)    Blisters where touched or applied  . Penicillins Hives    Will spread in patches all over the body.  . Adhesive (Tape) Other (See Comments)    Will blister skin where applied - do not use BAND-AIDS.  Marland Kitchen Codeine Hives    Will spread in patches all over the body.    Patient Measurements: Height: 5\' 3"  (160 cm) Weight: 193 lb 14.4 oz (87.952 kg) IBW/kg (Calculated) : 52.4  Adjusted Body Weight: 63kg BMI: 34  Vital Signs: Temp: 98.2 F (36.8 C) (06/06 0458) Temp src: Oral (06/06 0458) BP: 134/65 mmHg (06/06 0458) Pulse Rate: 124  (06/06 0458) Intake/Output from previous day: 06/05 0701 - 06/06 0700 In: 4202.3 [I.V.:2313.3; NG/GT:350; TPN:1538.9] Out: 2150 [Urine:2150] Intake/Output from this shift:    Labs:  Basename 08/13/11 0608 08/12/11 0445 08/11/11 0655  WBC -- 9.7 --  HGB -- 12.8 --  HCT -- 37.9 --  PLT -- 377 --  APTT -- -- --  INR 1.06 1.16 1.16     Basename 08/13/11 0608 08/12/11 0445 08/11/11 0655  NA 135 134* 131*  K 4.0 4.1 4.3  CL 99 100 97  CO2 25 26 22   GLUCOSE 141* 150* 204*  BUN 8 8 8   CREATININE 0.46* 0.46* 0.39*  LABCREA -- -- --  CREAT24HRUR -- -- --  CALCIUM 9.5 9.4 9.4  MG 1.8 1.8 1.8  PHOS 4.2 3.8 --  PROT 6.9 -- --  ALBUMIN 2.8* -- --  AST 19 -- --  ALT 24 -- --  ALKPHOS 92 -- --  BILITOT 0.3 -- --  BILIDIR -- -- --  IBILI -- -- --  PREALBUMIN -- -- --  TRIG -- -- --  CHOLHDL -- -- --  CHOL -- -- --   Estimated Creatinine Clearance: 75.7 ml/min (by C-G formula based on Cr of 0.46).    Basename 08/13/11 0724 08/13/11 0407 08/12/11 2351  GLUCAP 118* 129* 149*   Medications:  Scheduled:     . digoxin  0.125 mg Intravenous Daily  . enoxaparin (LOVENOX) injection  1.5 mg/kg  Subcutaneous Q24H  . FLUoxetine  20 mg Oral Daily  . insulin aspart  0-15 Units Subcutaneous Q4H  . insulin glargine  10 Units Subcutaneous QHS  . ketorolac  1 drop Right Eye BID  . levothyroxine  50 mcg Intravenous Daily  . lidocaine  20 mL Mouth/Throat Q4H  . magnesium sulfate 1 - 4 g bolus IVPB  3 g Intravenous Once  . metoCLOPramide (REGLAN) injection  10 mg Intravenous Q6H  . metoprolol  5 mg Intravenous Q6H  . pantoprazole (PROTONIX) IV  40 mg Intravenous Q24H  . potassium chloride  40 mEq Oral Daily  . prednisoLONE acetate  1 drop Right Eye BID  . DISCONTD: bisacodyl  10 mg Rectal Daily  . DISCONTD: multivitamin with minerals  1 tablet Oral Daily  . DISCONTD: simvastatin  10 mg Oral q1800   Infusions:     . 0.9 % NaCl with KCl 40 mEq / L 100 mL/hr at 08/13/11 1002  . fat emulsion 120 kcal (08/13/11 0649)  . TPN (CLINIMIX) +/- additives 65 mL/hr at 08/11/11 1743  . TPN (CLINIMIX) +/- additives 65 mL/hr at 08/12/11 1746   PRN:  albuterol, magic mouthwash, nystatin, ondansetron (ZOFRAN) IV, promethazine, sodium chloride  Insulin Requirements in the past 24 hours:  6 units SSI required with 60 units insulin / BAG TNA, moderate SSI q4h, lantus 10 units QHS (added 6/4).  Current Nutrition:  Clinimix E5/20 at 43ml/hr (goal - provides weekly average of 1578 kcal and 78g protein/day) NPO  Nutritional Goals:  1750-1970 kCal, 62-74 grams of protein per day  Assessment: 14 yof s/p ventral hernia repair (5/22) with CT abdomin showing adynamic ileus - etiology unclear - ?2/2 surgery or possible diabetic gastroparesis.  UGI showed no evidence of gastric outlet obstruction. NGT placed 5/30 with significant amount of bilious drainage. TNA started 5/30. Pt with positive flatus and bowel movements. PO meds changed back to IV due to worsening ileus. Plan to continue TPN, NG clamped.  Anticoag:  Chronic coumadin for Afib - coumadin on hold.  Full dose LMWH 1.5mg /kg/day.  CBC stable. Hx  TIA/CVA.  GI: Post op day #14 s/p ventral hernia repair. CT abdomin showing adynamic ileus +/- gastroparesis/GOO. Pt passing flatus/watery diarrhea and tolerating liquids, abd distention resolved. NGT unclamped, moderate bilious drainage. On reglan through 6/7/bisacodyl -considering erythromycin if no response to reglan. Prealbumin improved to 19.8. HX GERD/gastritis on ppi.  NPO diet but restarted PO medications.  Endo: Hx Hypothyroidism/DM on synthroid IV (higher than home dose) and TSH wnl. HbA1c 6.3. PO metformin/glipizide on hold d/t inability to swallow.  D/t elevated cBGs, lantus 10units QHS added 6/4 in addition to SSI and insulin in TNA.  cBGs at goal, following closely to assess insulin needs in TNA.    Lytes: K ok on KCl 40; Mag remains low at 1.8 (goal of >4/2, respectively) despite daily repletions. Phos, corr Ca ok.  Renal: Scr stable, UOP 1 ml/kg/hr.   I/O: +2L Pulm: RA  Cards: Hx Afib/HTN/HLD/CHF (no recent echo), RICA 40-59% stenosed, but no asa per neuro w/ hx TIA/CVA.  BP slightly elevated, tachycardic on IV digoxin, metoprolol, statin  (atenolol, diltiazem, lisinopril, pta po meds on hold).  On full dose lovenox (coumadin on hold).   Hepatobil: Baseline Alk phos/LFTs WNL, Trigs increased at 199 - will follow Neuro: Hx of depression/anxiety on fluoxetine (amytripyline on hold)  ID: Afebrile, WBC decreased to normal, s/p 4 days Cipro for UTI, 1 day fluconzole for yeast infx.    Urine Cx 5/30: 55,000 colonies, Mx spp. Wet prep 6/3: No trick/clue cells, few WBCs CDiff PCR 6/4: negative Cipro 5/30>>6/3 Fluconazole 150mg  x 1: 6/3   Best Practices: Lovenox, po ppi, home meds resumed   Plan:  -Continue Clinimix E5/20 at 49ml/hr (goal)  -IV trace elements, MVI, and lipids 20% MWF due to national shortage  -Continue 60 units insulin in TNA -Replete magnesium IV 4g x 1 -F/u ability to tolerate PO medications -F/u response to reglan, start of clears diet, and use of  erythromycin -F/u CBGs closely to assess TNA insulin requirments d/t addition of basal insulin -f/u am BMP, CBGs, Mg  Haynes Hoehn, PharmD 08/13/2011 10:09 AM  Pager: [phone]

## 2011-08-13 NOTE — Progress Notes (Signed)
Nutrition Brief Note:  Change in status noted; pt now on clear liquids after evidence of recurring ileus.  NGT has remained in place and pt continues on Clinimix 5/15 @ 65 mL/hr with 20% IV lipids @ 10 mL/hr providing 1313 kcal, 78g protein, meeting 75% kcal needs and 100% estimated protein needs. Pt has had several BMs today and decreased abdominal distention.  Hopeful for diet advancement soon.  Inadequate oral intake, ongoing No further interventions at this time.  Continue current management of parenteral nutrition per PharmD.  Limited ability to meet kcal needs with pre-mixed TPN may been improved with pt's PO intake of liquids for several days.  RD to continue to follow.  [name] Pager: [phone]

## 2011-08-13 NOTE — Progress Notes (Signed)
UR complete 

## 2011-08-13 NOTE — Progress Notes (Signed)
Patient interviewed and examined. Lab and x-ray reviewed.  Once again she feels fine. Passing flatus and stool. Denies pain cramps or nausea. Spirits better. Moderate bilious drainage from the NG tube. KK 4.0. On reglan.  The bone exam abdomen is soft. Distention has mostly resolved. Active bowel sounds. Nontender. Wounds looked fine.  Abdominal x-rays showed significant improvement. Gastric distention is essentially resolved, small bowel distention  has resolved for the most part. Contrast remains in colon with lots of gas in the colon.   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 There is no evidence of obstruction clinically or radiographically. There is no evidence of any complication the surgery, that I have been able to notify.  My impression is that we will need to continue to treat this medically. I have no other interventions.  I will await further advice from Dr. Rhea Belton of the gastroenterology service.  We'll continue to support with a T&A and a nasogastric decompression.  Angelia Mould. Derrell Lolling, M.D., [facility] General and Minimally invasive Surgery Breast and Colorectal Surgery Office:   [phone] Pager:   [phone]

## 2011-08-13 NOTE — Progress Notes (Signed)
15 Days Post-Op  Subjective: Resting quietly in bed lying flat, C/o sore throat from NG tube;Hungry. Episodes of diarrhea reported by patient. Still feels like she is not getting enough rest.  Objective: Vital signs in last 24 hours: Temp:  [98.1 F (36.7 C)-99.5 F (37.5 C)] 98.2 F (36.8 C) (06/06 0458) Pulse Rate:  [93-124] 124  (06/06 0458) Resp:  [15-20] 20  (06/06 0458) BP: (134-153)/(61-83) 134/65 mmHg (06/06 0458) SpO2:  [96 %-100 %] 99 % (06/06 0458) Last BM Date: 08/12/11  Intake/Output from previous day: 06/05 0701 - 06/06 0700 In: 4202.3 [I.V.:2313.3; NG/GT:350; ZOX:0960.4] Out: 2150 [Urine:2150] Intake/Output this shift:    General appearance: alert, cooperative and mild distress Abdomen: soft, non tender, hyperactive bowel sounds. Cardiac: S1-S2 no murmur rub or gallop Lungs: CTA bilaterally.  Lab Results:   [facility] 08/12/11 0445  WBC 9.7  HGB 12.8  HCT 37.9  PLT 377   BMET  Basename 08/13/11 0608 08/12/11 0445  NA 135 134*  K 4.0 4.1  CL 99 100  CO2 25 26  GLUCOSE 141* 150*  BUN 8 8  CREATININE 0.46* 0.46*  CALCIUM 9.5 9.4   PT/INR  Basename 08/13/11 0608 08/12/11 0445  LABPROT 14.0 15.0  INR 1.06 1.16   ABG No results found for this basename: PHART:2,PCO2:2,PO2:2,HCO3:2 in the last 72 hours  Studies/Results: Dg Abd 1 View  08/13/2011  *RADIOLOGY REPORT*  Clinical Data: Abdominal tenderness.  Follow-up ileus.  ABDOMEN - 1 VIEW  Comparison: 08/11/2011.  Findings: Oral contrast is seen throughout the colon. Mild residual dilatation of small bowel.  Overall pattern appears somewhat improved from 08/11/2011.  Nasogastric tube is partially imaged in the stomach, which is less distended.  IMPRESSION: Improving ileus.  Original Report Authenticated By: Reyes Ivan, M.D.   Dg Kayleen Memos W/water Sol Cm  08/11/2011  *RADIOLOGY REPORT*  Clinical Data:  Diabetic with recurrent emesis, gastric and small bowel distension.  Question gastric obstruction or  gastroparesis.  UPPER GI SERIES WITHOUT KUB  Technique:  Routine upper GI series was performed with water- soluble contrast (300 ml Omnipaque-300) per nasogastric tube.  Fluoroscopy Time: 0.55 minutes  Comparison:  Abdominal radiographs same date.  Abdominal pelvic CT 08/06/2011.  Findings: The radiographs obtained earlier today demonstrate moderate distension of the stomach and proximal small bowel.  There is mild distension of the distal small bowel and colon.  The stomach was filled easily.  Turning the patient erect and into the right lateral decubitus positions demonstrates no mucosal ulceration or wall thickening.  Within the right lateral decubitus position, there is rapid emptying into a moderately distended duodenum.  The duodenum demonstrates uniform fold thickening.  No focal mucosal ulceration is identified. Contrast passes between the superior mesenteric artery and the aorta.  The fourth portion of the duodenum and proximal jejunum are also mildly distended.  No focal transition point or extravasation is seen.  The esophagus is not evaluated by this examination performed per nasogastric tube.  No reflux was seen.  IMPRESSION:  1.  No evidence of gastric outlet obstruction. 2.  The stomach and proximal small bowel are moderately distended without demonstrated focal transition point.  Recent CT demonstrated a relatively gradual transition to normal caliber distal small bowel such that mechanical obstruction remains unlikely.  Radiographic followup suggested. 3.  Uniform wall thickening of the duodenum likely represents edema.  Original Report Authenticated By: Gerrianne Scale, M.D.    Anti-infectives: Anti-infectives     Start  Dose/Rate Route Frequency Ordered Stop   08/10/11 1130   fluconazole (DIFLUCAN) tablet 150 mg        150 mg Oral  Once 08/10/11 1023 08/10/11 1140   08/06/11 1800   ciprofloxacin (CIPRO) IVPB 400 mg  Status:  Discontinued        400 mg 200 mL/hr over 60 Minutes  Intravenous Every 12 hours 08/06/11 1727 08/10/11 0756   07/29/11 1500   ceFAZolin (ANCEF) IVPB 2 g/50 mL premix        2 g 100 mL/hr over 30 Minutes Intravenous 3 times per day 07/29/11 1348 07/30/11 0536   07/29/11 1021   polymyxin B 500,000 Units, bacitracin 50,000 Units in sodium chloride irrigation 0.9 % 500 mL irrigation  Status:  Discontinued          As needed 07/29/11 1023 07/29/11 1136   07/29/11 0726   ceFAZolin (ANCEF) 2-3 GM-% IVPB SOLR     Comments: Lahoma Rocker: cabinet override         07/29/11 0726 07/29/11 1929   07/29/11 0723   ceFAZolin (ANCEF) IVPB 2 g/50 mL premix        2 g 100 mL/hr over 30 Minutes Intravenous 60 min pre-op 07/29/11 0723 07/29/11 0855          Assessment/Plan: s/p Procedure(s) (LRB): LAPAROSCOPIC VENTRAL HERNIA (N/A) INSERTION OF MESH (N/A) 1. Review of abdominal films demonstrates no evidence of SBO, NG is in good position. 2. Hypomagnesia 3. A-Fib (stable)   Plan: 1. Continue TNA  adjustments as per pharmacy. 2. DM II continue to monitor BG 3. Hypothyroidism: Continue to monitor TSH (stable at present) 4. HTN: controlled 5. Anxiety/Depression: continue with prozac, monitor affect for any worsening s/s. 6. Continue with PT/OT/OOB. 7. Attempt to bundle procedures/interruption to facilitate patient rest.   LOS: 15 days    Charistopher Rumble 08/13/2011

## 2011-08-13 NOTE — Progress Notes (Signed)
     [facility] Daily Rounding Note 08/13/2011, 9:31 AM  SUBJECTIVE:       Hungry.  Throat sore from NGT.  Diarrheal stool, 3 to 4 yesterday, 2 so far today. Feels better.  Has not been walked yesterday, apparently walking planned for today.   OBJECTIVE:        General: Alert, looks well     Vital signs in last 24 hours:    Temp:  [98.1 F (36.7 C)-99.5 F (37.5 C)] 98.2 F (36.8 C) (06/06 0458) Pulse Rate:  [93-124] 124  (06/06 0458) Resp:  [15-20] 20  (06/06 0458) BP: (134-153)/(61-83) 134/65 mmHg (06/06 0458) SpO2:  [96 %-100 %] 99 % (06/06 0458) Last BM Date: 08/12/11  Heart: Irregular, rate controlled Chest: Clear B.  No resp distress. Abdomen: soft, active, non-tinkling BS.  No distention or tenderness  Extremities: no pedal edema. Neuro/Psych:  Pleasant, no confusion.   Intake/Output from previous day: NGT ouput 350 cc yesterday.  C/w 550 the previous day.     Lab Results:  [facility] 08/12/11 0445  WBC 9.7  HGB 12.8  HCT 37.9  PLT 377   BMET  Basename 08/13/11 0608 08/12/11 0445 08/11/11 0655  NA 135 134* 131*  K 4.0 4.1 4.3  CL 99 100 97  CO2 25 26 22   GLUCOSE 141* 150* 204*  BUN 8 8 8   CREATININE 0.46* 0.46* 0.39*  CALCIUM 9.5 9.4 9.4   LFT  Basename 08/13/11 0608  PROT 6.9  ALBUMIN 2.8*  AST 19  ALT 24  ALKPHOS 92  BILITOT 0.3  BILIDIR --  IBILI --   PT/INR  Basename 08/13/11 0608 08/12/11 0445  LABPROT 14.0 15.0  INR 1.06 1.16   Studies/Results: Dg Abd 1 View 08/13/2011  *RADIOLOGY REPORT*  Clinical Data: Abdominal tenderness.  Follow-up ileus.  ABDOMEN - 1 VIEW  Comparison: 08/11/2011.  Findings: Oral contrast is seen throughout the colon. Mild residual dilatation of small bowel.  Overall pattern appears somewhat improved from 08/11/2011.  Nasogastric tube is partially imaged in the stomach, which is less distended.  IMPRESSION: Improving ileus.  Original Report Authenticated By: Reyes Ivan, M.D.    ASSESMENT: * Post op ileus  SB, GOO post lap ventral hernia repairs and extensive LOA 5/22.   Ileus and no obstruction on GG study 6/4. KUB this AM improving.   NPO and receiving TNA. NGT in place.  Reglan IV resumed yesterday for 8 doses. . * Bloody appearing NGT material, suspect bleeding from NGT trauma, it is currently non-bloody. On once daily IV Protonix.  * Remote repair of HH. Subsequent c/o dysphagia but no strictures, has been empirically dilated. On Omeprazole PTA, IV protonix currently.  * IDDM  * Renal insufficiency. * Fatty liver, has had elevated transaminases in past.  * A fib, chronic coumadin on hold.  Coverage with Lovenox.   * Chronic pain Syndrome, fibromyalgia. Seems well controlled without narcotics currently.  * Polypharmacy. List of 25 meds/supplements on PTA med list.    PLAN: *  Would trial clear trays, leaving NGT in place. Need to have surgeon sanction this.  Dr Derrell Lolling is fine with this.  *  Continue Reglan through tomorrow AM (last dose set for 6/7 at 0600).  Continue TNA.   LOS: 15 days   Jennye Moccasin  08/13/2011, 9:31 AM Pager: [phone]

## 2011-08-13 NOTE — Progress Notes (Signed)
Patient seen, examined, and I agree with the above documentation, including the assessment and plan. Ileus improving by plain film.  Trial of reglan and trial of clears. Slow going as before.  Hopefully reglan will help, and could try erythromycin if necessary. Encourage ambulation. This has been a frustrating issue for the patient .  No other medical therapy that I am aware of at present.

## 2011-08-13 NOTE — Progress Notes (Signed)
Occupational Therapy Treatment Patient Details Name: [name] MRN: [id number] DOB: 26-Nov-1950 Today's Date: 08/13/2011 Time: 8657-8469 OT Time Calculation (min): 27 min  OT Assessment / Plan / Recommendation Comments on Treatment Session Pt did very well this session, making steady progress.    Follow Up Recommendations  Home health OT    Barriers to Discharge       Equipment Recommendations  Cane;3 in 1 bedside comode    Recommendations for Other Services    Frequency Min 2X/week   Plan Discharge plan remains appropriate    Precautions / Restrictions Precautions Precautions: Fall   Pertinent Vitals/Pain     ADL  Grooming: Performed;Wash/dry face;Teeth care;Brushing hair Where Assessed - Grooming: Unsupported standing Upper Body Bathing: Performed;Set up Where Assessed - Upper Body Bathing: Unsupported sitting Lower Body Bathing: Simulated;Set up Where Assessed - Lower Body Bathing: Unsupported sit to stand Upper Body Dressing: Simulated;Set up Where Assessed - Upper Body Dressing: Unsupported sitting Lower Body Dressing: Performed;Set up Where Assessed - Lower Body Dressing: Unsupported sitting (Pt was able to don/doff socks without the use of AD.) Toilet Transfer: Performed;Supervision/safety Toilet Transfer Method: Sit to Barista: Regular height toilet;Grab bars Toileting - Clothing Manipulation and Hygiene: Performed;Minimal assistance (for thoroughness with back peri area.) Where Assessed - Toileting Clothing Manipulation and Hygiene: Standing ADL Comments: Pt initially resistant to tx. Max encouragement for participation. Towards the end of session, pt perked up, became pleasant and talkative and began initiating ADL activities without being prompted.    OT Diagnosis:    OT Problem List:   OT Treatment Interventions:     OT Goals ADL Goals Pt Will Perform Lower Body Bathing: with modified independence ADL Goal: Lower Body Bathing  - Progress: Updated due to goal met Pt Will Perform Lower Body Dressing: with modified independence ADL Goal: Lower Body Dressing - Progress: Updated due to goal met ADL Goal: Toilet Transfer - Progress: Progressing toward goals  Visit Information  Last OT Received On: 08/13/11 Assistance Needed: +1    Subjective Data  Subjective: Do we have to do this now?   Prior Functioning       Cognition  Overall Cognitive Status: Appears within functional limits for tasks assessed/performed Arousal/Alertness: Awake/alert Orientation Level: Appears intact for tasks assessed Behavior During Session: [facility] for tasks performed    Mobility Bed Mobility Supine to Sit: 6: Modified independent (Device/Increase time) Transfers Sit to Stand: 5: Supervision;With upper extremity assist;From bed;From toilet Stand to Sit: 5: Supervision;With upper extremity assist;With armrests;To chair/3-in-1;To toilet Details for Transfer Assistance: min VCs for hand placement, supervision for safety only.   Exercises    Balance Static Sitting Balance Static Sitting - Balance Support: No upper extremity supported;Feet supported Static Sitting - Level of Assistance: 6: Modified independent (Device/Increase time) Dynamic Standing Balance Dynamic Standing - Balance Support: No upper extremity supported;During functional activity Dynamic Standing - Level of Assistance: 5: Stand by assistance  End of Session OT - End of Session Activity Tolerance: Patient tolerated treatment well Patient left: in chair;with call bell/phone within reach   , A OTR/L [phone] 08/13/2011, 10:02 AM

## 2011-08-13 NOTE — Progress Notes (Signed)
Subjective: Patient states that she feels better with NG tube suction.  No N/V/Abd pain. Endorses passing fluctuance. 2 loose stools noted  C-Diff Negative on 6/5//13. She states that she feels frustrated over her prolonged hospital stay.   Objective: Vital signs in last 24 hours: Filed Vitals:   08/13/11 0458 08/13/11 1000 08/13/11 1345 08/13/11 1349  BP: 134/65 149/73 144/79 158/100  Pulse: 124 61 113 113  Temp: 98.2 F (36.8 C) 99.1 F (37.3 C) 99.6 F (37.6 C) 99 F (37.2 C)  TempSrc: Oral Oral Oral Oral  Resp: 20 18 20 20   Height:      Weight:      SpO2: 99% 100% 99% 99%   Weight change:   Intake/Output Summary (Last 24 hours) at 08/13/11 1902 Last data filed at 08/13/11 1801  Gross per 24 hour  Intake   3722 ml  Output   2200 ml  Net   1522 ml   Physical exam  General: NAD. NGT to suction  Cardiac: irregularly irregular rhythm, no M/G/R  Pulm: CTA B/L  Abd: Soft, active BS x 4. Non tender. No distention . Multiple laparoscopic incisions healing well.  Ext: warm and well perfused, no pedal edema. No tenderness or swelling. No erythema.  Neuro: alert and oriented X3, cranial nerves II-XII grossly intact    Lab Results: Basic Metabolic Panel:  Lab 08/13/11 1610 08/12/11 0445  [name] 135 134*  K 4.0 4.1  CL 99 100  CO2 25 26  GLUCOSE 141* 150*  BUN 8 8  CREATININE 0.46* 0.46*  CALCIUM 9.5 9.4  MG 1.8 1.8  PHOS 4.2 3.8   Liver Function Tests:  Lab 08/13/11 0608 08/10/11 0603  AST 19 25  ALT 24 30  ALKPHOS 92 93  BILITOT 0.3 0.5  PROT 6.9 6.7  ALBUMIN 2.8* 2.7*   CBC:  Lab 08/12/11 0445 08/10/11 0603 08/07/11 0520  WBC 9.7 13.5* --  NEUTROABS -- 9.4* 6.0  HGB 12.8 13.3 --  HCT 37.9 39.4 --  MCV 93.1 93.8 --  PLT 377 397 --   CBG:  Lab 08/13/11 1643 08/13/11 1139 08/13/11 0724 08/13/11 0407 08/12/11 2351 08/12/11 2024  GLUCAP 139* 141* 118* 129* 149* 145*   Fasting Lipid Panel:  Lab 08/10/11 0603  CHOL 112  HDL --  LDLCALC --  TRIG 199*   CHOLHDL --  LDLDIRECT --   Coagulation:  Lab 08/13/11 0608 08/12/11 0445 08/11/11 0655 08/10/11 0603  LABPROT 14.0 15.0 15.0 13.6  INR 1.06 1.16 1.16 1.02   Micro Results: Recent Results (from the past 240 hour(s))  URINE CULTURE     Status: Normal   Collection Time   08/06/11  9:00 AM      Component Value Range Status Comment   Specimen Description URINE, CLEAN CATCH   Final    Special Requests NONE   Final    Culture  Setup Time [id number]   Final    Colony Count 55,000 COLONIES/ML   Final    Culture     Final    Value: Multiple bacterial morphotypes present, none predominant. Suggest appropriate recollection if clinically indicated.   Report Status 08/07/2011 FINAL   Final   WET PREP, GENITAL     Status: Abnormal   Collection Time   08/10/11 10:24 AM      Component Value Range Status Comment   Yeast Wet Prep HPF POC NONE SEEN  NONE SEEN  Final SPECIMEN SUBMITTED IN 2 mls OF SALINE  Trich, Wet Prep NONE SEEN  NONE SEEN  Final    Clue Cells Wet Prep HPF POC NONE SEEN  NONE SEEN  Final    WBC, Wet Prep HPF POC FEW (*) NONE SEEN  Final   CLOSTRIDIUM DIFFICILE BY PCR     Status: Normal   Collection Time   08/11/11  8:54 PM      Component Value Range Status Comment   C difficile by pcr NEGATIVE  NEGATIVE  Final    Studies/Results: Dg Abd 1 View  08/13/2011  *RADIOLOGY REPORT*  Clinical Data: Abdominal tenderness.  Follow-up ileus.  ABDOMEN - 1 VIEW  Comparison: 08/11/2011.  Findings: Oral contrast is seen throughout the colon. Mild residual dilatation of small bowel.  Overall pattern appears somewhat improved from 08/11/2011.  Nasogastric tube is partially imaged in the stomach, which is less distended.  IMPRESSION: Improving ileus.  Original Report Authenticated By: Reyes Ivan, M.D.   Medications: I have reviewed the patient's current medications. Scheduled Meds:   . digoxin  0.125 mg Intravenous Daily  . enoxaparin (LOVENOX) injection  1.5 mg/kg Subcutaneous Q24H  .  FLUoxetine  20 mg Oral Daily  . insulin aspart  0-15 Units Subcutaneous Q4H  . insulin glargine  10 Units Subcutaneous QHS  . ketorolac  1 drop Right Eye BID  . levothyroxine  50 mcg Intravenous Daily  . lidocaine  20 mL Mouth/Throat Q4H  . magnesium sulfate 1 - 4 g bolus IVPB  4 g Intravenous Once  . metoCLOPramide (REGLAN) injection  10 mg Intravenous Q6H  . metoprolol  5 mg Intravenous Q6H  . pantoprazole (PROTONIX) IV  40 mg Intravenous Q24H  . potassium chloride  40 mEq Oral Daily  . prednisoLONE acetate  1 drop Right Eye BID  . DISCONTD: bisacodyl  10 mg Rectal Daily  . DISCONTD: simvastatin  10 mg Oral q1800   Continuous Infusions:   . 0.9 % NaCl with KCl 40 mEq / L 100 mL/hr at 08/13/11 1002  . fat emulsion 120 kcal (08/13/11 0649)  . TPN (CLINIMIX) +/- additives 65 mL/hr at 08/12/11 1746  . TPN (CLINIMIX) +/- additives 65 mL/hr at 08/13/11 1755   PRN Meds:.albuterol, magic mouthwash, nystatin, ondansetron (ZOFRAN) IV, promethazine, sodium chloride Assessment/Plan:  # Post Op ileus: Ventral hernia s/p laparoscopic lysis of adhesions and hernia repair with mesh 07/29/11.  Ileus waxing and waning Abd x ray>>> "Increased gaseous distension of multiple loops of large and small bowel most suggestive of ileus.".  UGI series>>>No evidence of gastric outlet obstruction. The stomach and proximal small bowel are moderately distended without demonstrated focal transition point. Uniform wall thickening of the duodenum likely represents edema.   -TPN, NG to suction per Dr. Derrell Lolling.  - advance CL today by GI. - IV mediations for medical problems.  - Etiology of her persistent ileus is not completely clear. ? Postoperative ileus  -Gastroparesis could contribute some. ? Decreased GI motility>>IV Reglan started per GI  # Diarrhea  2 watery diarrhea today. likely due to her ileus  - C-diff negative  - will hold on medical treatment such as imodium given her ileus.   # Vaginal itching ,  better  No discharge.  - wet prep genital and vaginal negtaive  - likely yeast infection>>diflucan 150 mg po x1 on 08/10/11   # Hypomagnesemia and hypokalemia  - K 4.0 and Mg 1.8  - Mg eplaced this morning  - follow up BMP and Mg in am   #  Leukocytosis:resolved  ? De margination vs ileus / postoperative state vs possible UTI.  -UA Mild pyuria, rare bacteria, negative nitrates  -Urine Cx>>>indicated non clean catch.  - cipro for short course for presumed UTI>>>D/C'd   Afib: Stable, rates ~100 bpm at goal on atenolol 25mg  PO BID, Diltiazem XR 180mg  PO daily, Digoxin 0.25 mg daily.  - full dose lovenox  - switch to IV meds  - coumadin on hold   DM2: Well controlled. A1c- 6.3.  -on TPN.  - Continue lantus and SSI>>>CBG good control   Hypothyroidism: TSH- 0.756. Normal.  - synthroid IV   Hypertension: BP up an down but control ok overall.  -Lopressor IV  -hold Lisinopril for now   Hx of TIA/CVA: Unclear when pt had it. She refuses any Hx though.  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 This was discussed by Dr. Dierdre Searles with Dr. Pearlean Brownie with Neurology.  -Lovenox full dose in place of warfarin while NPO.   Depression: Continue PO Prozac if she can swallow it to avoid SSRI withdrawal  - hold gabapentin and amitriptyline for now since on narcotics for pain. Restart these on discharge.   GERD: Will convert Protonix to IV while NPO with NGT.   DVT Prophylaxis: Lovenox    LOS: 15 days   Ancil Dewan 08/13/2011, 7:02 PM

## 2011-08-14 LAB — BASIC METABOLIC PANEL
CO2: 25 mEq/L (ref 19–32)
Calcium: 9.5 mg/dL (ref 8.4–10.5)
Chloride: 99 mEq/L (ref 96–112)
Creatinine, Ser: 0.53 mg/dL (ref 0.50–1.10)
GFR calc Af Amer: >90 mL/min (ref >90)
GFR calc non Af Amer: >90 mL/min (ref >90)
Potassium: 3.9 mEq/L (ref 3.5–5.1)

## 2011-08-14 LAB — GLUCOSE, CAPILLARY
Glucose-Capillary: 152 mg/dL — ABNORMAL HIGH (ref 70–99)
Glucose-Capillary: 137 mg/dL — ABNORMAL HIGH (ref 70–99)
Glucose-Capillary: 148 mg/dL — ABNORMAL HIGH (ref 70–99)

## 2011-08-14 LAB — MAGNESIUM: Magnesium: 1.9 mg/dL (ref 1.5–2.5)

## 2011-08-14 MED ORDER — FAT EMULSION 20 % IV EMUL
250.0000 mL | INTRAVENOUS | Status: AC
  Administered 2011-08-14: 250 mL via INTRAVENOUS
  Filled 2011-08-14: qty 250

## 2011-08-14 MED ORDER — MAGNESIUM SULFATE 40 MG/ML IJ SOLN
2.0000 g | Freq: Once | INTRAMUSCULAR | Status: AC
  Administered 2011-08-14: 2 g via INTRAVENOUS
  Filled 2011-08-14: qty 50

## 2011-08-14 MED ORDER — METOCLOPRAMIDE HCL 5 MG/ML IJ SOLN
10.0000 mg | Freq: Four times a day (QID) | INTRAMUSCULAR | Status: AC
  Administered 2011-08-14 – 2011-08-16 (×10): 10 mg via INTRAVENOUS
  Filled 2011-08-14 (×13): qty 2

## 2011-08-14 MED ORDER — ZINC TRACE METAL 1 MG/ML IV SOLN
INTRAVENOUS | Status: AC
  Administered 2011-08-14: 17:00:00 via INTRAVENOUS
  Filled 2011-08-14: qty 2000

## 2011-08-14 MED ORDER — MAGNESIUM SULFATE 50 % IJ SOLN
2.0000 g | Freq: Once | INTRAVENOUS | Status: DC
  Filled 2011-08-14: qty 4

## 2011-08-14 MED ORDER — POTASSIUM CHLORIDE CRYS ER 20 MEQ PO TBCR
40.0000 meq | EXTENDED_RELEASE_TABLET | Freq: Once | ORAL | Status: AC
  Administered 2011-08-14: 40 meq via ORAL
  Filled 2011-08-14: qty 2

## 2011-08-14 MED ORDER — INSULIN GLARGINE 100 UNIT/ML ~~LOC~~ SOLN
20.0000 [IU] | Freq: Every day | SUBCUTANEOUS | Status: DC
  Administered 2011-08-14 – 2011-08-16 (×3): 20 [IU] via SUBCUTANEOUS

## 2011-08-14 MED ORDER — POTASSIUM CHLORIDE IN NACL 40-0.9 MEQ/L-% IV SOLN
INTRAVENOUS | Status: DC
  Administered 2011-08-14: 20 mL/h via INTRAVENOUS
  Filled 2011-08-14 (×3): qty 1000

## 2011-08-14 NOTE — Progress Notes (Signed)
PARENTERAL NUTRITION CONSULT NOTE - FOLLOW UP  Pharmacy Consult for TPN Indication: adynamic Ileus s/p ventral hernia repair   Allergies  Allergen Reactions  . Gemfibrozil Swelling    REACTION: Angioedema  . Latex Other (See Comments)    Blisters where touched or applied  . Penicillins Hives    Will spread in patches all over the body.  . Adhesive (Tape) Other (See Comments)    Will blister skin where applied - do not use BAND-AIDS.  Marland Kitchen Codeine Hives    Will spread in patches all over the body.    Patient Measurements: Height: 5\' 3"  (160 cm) Weight: 193 lb 14.4 oz (87.952 kg) IBW/kg (Calculated) : 52.4  Adjusted Body Weight: 63kg BMI: 34  Vital Signs: Temp: 98.8 F (37.1 C) (06/07 0500) Temp src: Oral (06/07 0500) BP: 141/75 mmHg (06/07 0500) Pulse Rate: 113  (06/07 0500) Intake/Output from previous day: 06/06 0701 - 06/07 0700 In: 3811 [P.O.:120; I.V.:2067; ZOX:0960] Out: 3325 [Urine:1700; Emesis/NG output:1625] Intake/Output from this shift: Total I/O In: -  Out: 100 [Emesis/NG output:100]  Labs:  [facility] 08/13/11 0608 08/12/11 0445  WBC -- 9.7  HGB -- 12.8  HCT -- 37.9  PLT -- 377  APTT -- --  INR 1.06 1.16     Basename 08/14/11 0630 08/13/11 0608 08/12/11 0445  NA 134* 135 134*  K 3.9 4.0 4.1  CL 99 99 100  CO2 25 25 26   GLUCOSE 141* 141* 150*  BUN 9 8 8   CREATININE 0.53 0.46* 0.46*  LABCREA -- -- --  CREAT24HRUR -- -- --  CALCIUM 9.5 9.5 9.4  MG 1.9 1.8 1.8  PHOS -- 4.2 3.8  PROT -- 6.9 --  ALBUMIN -- 2.8* --  AST -- 19 --  ALT -- 24 --  ALKPHOS -- 92 --  BILITOT -- 0.3 --  BILIDIR -- -- --  IBILI -- -- --  PREALBUMIN -- -- --  TRIG -- -- --  CHOLHDL -- -- --  CHOL -- -- --   Estimated Creatinine Clearance: 75.7 ml/min (by C-G formula based on Cr of 0.53).    Basename 08/14/11 0814 08/14/11 0351 08/13/11 2347  GLUCAP 124* 152* 140*   Medications:  Scheduled:     . digoxin  0.125 mg Intravenous Daily  . enoxaparin (LOVENOX)  injection  1.5 mg/kg Subcutaneous Q24H  . FLUoxetine  20 mg Oral Daily  . insulin aspart  0-15 Units Subcutaneous Q4H  . insulin glargine  10 Units Subcutaneous QHS  . ketorolac  1 drop Right Eye BID  . levothyroxine  50 mcg Intravenous Daily  . lidocaine  20 mL Mouth/Throat Q4H  . magnesium sulfate 1 - 4 g bolus IVPB  2 g Intravenous Once  . magnesium sulfate 1 - 4 g bolus IVPB  4 g Intravenous Once  . metoCLOPramide (REGLAN) injection  10 mg Intravenous Q6H  . metoprolol  5 mg Intravenous Q6H  . pantoprazole (PROTONIX) IV  40 mg Intravenous Q24H  . potassium chloride  40 mEq Oral Daily  . potassium chloride  40 mEq Oral Once  . prednisoLONE acetate  1 drop Right Eye BID  . DISCONTD: magnesium sulfate LVP 250-500 ml  2 g Intravenous Once   Infusions:     . 0.9 % NaCl with KCl 40 mEq / L 100 mL/hr at 08/14/11 0010  . fat emulsion 120 kcal (08/13/11 0649)  . TPN (CLINIMIX) +/- additives 65 mL/hr at 08/12/11 1746  . TPN (CLINIMIX) +/-  additives 65 mL/hr at 08/13/11 1755   PRN: albuterol, magic mouthwash, nystatin, ondansetron (ZOFRAN) IV, promethazine, sodium chloride  Insulin Requirements in the past 24 hours:  9 units SSI required with 60 units insulin / BAG TNA, moderate SSI q4h, lantus 10 units QHS (added 6/4).  Current Nutrition:  Clinimix E5/20 at 68ml/hr (goal - provides weekly average of 1578 kcal and 78g protein/day) NPO  Nutritional Goals:  1750-1970 kCal, 62-74 grams of protein per day  Assessment: 10 yof s/p ventral hernia repair (5/22) with CT abdomin showing adynamic ileus - etiology unclear - ?2/2 surgery or possible diabetic gastroparesis.  UGI showed no evidence of gastric outlet obstruction. NGT placed 5/30 with significant amount of bilious drainage. TNA started 5/30. Pt with positive flatus and bowel movements. PO meds changed back to IV due to worsening ileus. Plan to continue TPN, NG clamped.  Anticoag:  Chronic coumadin for Afib - coumadin on hold.  Full  dose LMWH 1.5mg /kg/day.  CBC stable. Hx TIA/CVA.  GI: Post op day #14 s/p ventral hernia repair. CT abdomin showing adynamic ileus +/- gastroparesis/GOO. Pt passing flatus/watery diarrhea and tolerating liquids, abd distention resolved. NGT unclamped, moderate bilious drainage. On reglan through 6/7/bisacodyl -considering erythromycin if no response to reglan. Prealbumin improved to 19.8. HX GERD/gastritis on ppi.  Clamping NG tube and on CL - watching to see how she does.  Will get xray prior to NG removal.   Abd. Xray yesterday - improving ileus. Endo: Hx Hypothyroidism/DM on synthroid IV (higher than home dose) and TSH wnl. HbA1c 6.3. PO metformin/glipizide on hold d/t inability to swallow.  D/t elevated cBGs.  Will increase Lantus and decrease TNA insulin to hopefully help with transition to diet.    Lytes:  Electrolytes ok this morning. Slow downward trend of K + need to watch - no replacement today.   Renal: Scr stable, UOP 0.8 ml/kg/hr.   I/O: +2L Pulm: RA  Cards: Hx Afib/HTN/HLD/CHF (no recent echo), RICA 40-59% stenosed, but no asa per neuro w/ hx TIA/CVA.  BP slightly elevated, tachycardic on IV digoxin, metoprolol, statin  (atenolol, diltiazem, lisinopril, pta po meds on hold).  On full dose lovenox (coumadin on hold).   Hepatobil: Baseline Alk phos/LFTs WNL, Trigs increased at 199 - will follow Neuro: Hx of depression/anxiety on fluoxetine (amytripyline on hold)  ID: Afebrile, WBC decreased to normal, s/p 4 days Cipro for UTI, 1 day fluconzole for yeast infx.    Urine Cx 5/30: 55,000 colonies, Mx spp. Wet prep 6/3: No trick/clue cells, few WBCs CDiff PCR 6/4: negative Cipro 5/30>>6/3 Fluconazole 150mg  x 1: 6/3   Best Practices: Lovenox, po ppi, home meds resumed   Plan:  -Continue Clinimix E5/20 at 81ml/hr (goal)  -IV trace elements, MVI, and lipids 20% MWF due to national shortage  - Reduce insulin to 50 units in TNA and increase Lantus to 20 units at HS -F/u ability to tolerate  PO medications -F/u response to reglan, start of clears diet, and use of erythromycin -F/u CBGs closely to assess TNA insulin requirments d/t addition of basal insulin -f/u am BMP, CBGs, Mg  Nadara Mustard, PharmD., MS Clinical Pharmacist Pager:  [phone]  Thank you for allowing pharmacy to be part of this patients care team. 08/14/2011 9:28 AM

## 2011-08-14 NOTE — Progress Notes (Signed)
Patient seen, examined, and I agree with the above documentation, including the assessment and plan. Trial of clamping NG tube with continuation of clear liquid diet. For now I think we should continue 4 times a day Reglan to help with gastric motility. This should not be a long-term medication for her. Continue TPN until adequate caloric intake by mouth

## 2011-08-14 NOTE — Progress Notes (Signed)
Subjective: Patient states that she feels better with NG tube suction. CL initiated yesterday and tolerated well.  No N/V/Abd pain. Endorses passing fluctuance. 1 loose stools last night.  C-Diff Negative on 6/5//13. She states that she feels frustrated over her prolonged hospital stay.     Objective: Vital signs in last 24 hours: Filed Vitals:   08/13/11 1345 08/13/11 1349 08/13/11 2138 08/14/11 0500  BP: 144/79 158/100 143/65 141/75  Pulse: 113 113 106 113  Temp: 99.6 F (37.6 C) 99 F (37.2 C) 98.6 F (37 C) 98.8 F (37.1 C)  TempSrc: Oral Oral Oral Oral  Resp: 20 20 18 18   Height:      Weight:      SpO2: 99% 99% 98% 99%   Weight change:   Intake/Output Summary (Last 24 hours) at 08/14/11 0808 Last data filed at 08/14/11 0606  Gross per 24 hour  Intake   3811 ml  Output   3325 ml  Net    486 ml   General: NAD. NGT to suction  Cardiac: irregularly irregular rhythm, no M/G/R  Pulm: CTA B/L  Abd: Soft, active BS x 4. Non tender. No distention . Multiple laparoscopic incisions healing well.  Ext: warm and well perfused, no pedal edema. No tenderness or swelling. No erythema.  Neuro: alert and oriented X3, cranial nerves II-XII grossly intact   Lab Results: Basic Metabolic Panel:  Lab 08/14/11 1610 08/13/11 0608 08/12/11 0445  [name] 134* 135 --  K 3.9 4.0 --  CL 99 99 --  CO2 25 25 --  GLUCOSE 141* 141* --  BUN 9 8 --  CREATININE 0.53 0.46* --  CALCIUM 9.5 9.5 --  MG 1.9 1.8 --  PHOS -- 4.2 3.8   Liver Function Tests:  Lab 08/13/11 0608 08/10/11 0603  AST 19 25  ALT 24 30  ALKPHOS 92 93  BILITOT 0.3 0.5  PROT 6.9 6.7  ALBUMIN 2.8* 2.7*   CBC:  Lab 08/12/11 0445 08/10/11 0603  WBC 9.7 13.5*  NEUTROABS -- 9.4*  HGB 12.8 13.3  HCT 37.9 39.4  MCV 93.1 93.8  PLT 377 397   CBG:  Lab 08/14/11 0351 08/13/11 2347 08/13/11 2008 08/13/11 1643 08/13/11 1139 08/13/11 0724  GLUCAP 152* 140* 148* 139* 141* 118*   Fasting Lipid Panel:  Lab 08/10/11 0603    CHOL 112  HDL --  LDLCALC --  TRIG 199*  CHOLHDL --  LDLDIRECT --   Coagulation:  Lab 08/13/11 0608 08/12/11 0445 08/11/11 0655 08/10/11 0603  LABPROT 14.0 15.0 15.0 13.6  INR 1.06 1.16 1.16 1.02    Micro Results: Recent Results (from the past 240 hour(s))  URINE CULTURE     Status: Normal   Collection Time   08/06/11  9:00 AM      Component Value Range Status Comment   Specimen Description URINE, CLEAN CATCH   Final    Special Requests NONE   Final    Culture  Setup Time [id number]   Final    Colony Count 55,000 COLONIES/ML   Final    Culture     Final    Value: Multiple bacterial morphotypes present, none predominant. Suggest appropriate recollection if clinically indicated.   Report Status 08/07/2011 FINAL   Final   WET PREP, GENITAL     Status: Abnormal   Collection Time   08/10/11 10:24 AM      Component Value Range Status Comment   Yeast Wet Prep HPF POC NONE SEEN  NONE SEEN  Final SPECIMEN SUBMITTED IN 2 mls OF SALINE   Trich, Wet Prep NONE SEEN  NONE SEEN  Final    Clue Cells Wet Prep HPF POC NONE SEEN  NONE SEEN  Final    WBC, Wet Prep HPF POC FEW (*) NONE SEEN  Final   CLOSTRIDIUM DIFFICILE BY PCR     Status: Normal   Collection Time   08/11/11  8:54 PM      Component Value Range Status Comment   C difficile by pcr NEGATIVE  NEGATIVE  Final    Studies/Results: Dg Abd 1 View  08/13/2011  *RADIOLOGY REPORT*  Clinical Data: Abdominal tenderness.  Follow-up ileus.  ABDOMEN - 1 VIEW  Comparison: 08/11/2011.  Findings: Oral contrast is seen throughout the colon. Mild residual dilatation of small bowel.  Overall pattern appears somewhat improved from 08/11/2011.  Nasogastric tube is partially imaged in the stomach, which is less distended.  IMPRESSION: Improving ileus.  Original Report Authenticated By: Reyes Ivan, M.D.   Medications: I have reviewed the patient's current medications. Scheduled Meds:   . digoxin  0.125 mg Intravenous Daily  . enoxaparin  (LOVENOX) injection  1.5 mg/kg Subcutaneous Q24H  . FLUoxetine  20 mg Oral Daily  . insulin aspart  0-15 Units Subcutaneous Q4H  . insulin glargine  10 Units Subcutaneous QHS  . ketorolac  1 drop Right Eye BID  . levothyroxine  50 mcg Intravenous Daily  . lidocaine  20 mL Mouth/Throat Q4H  . magnesium sulfate LVP 250-500 ml  2 g Intravenous Once  . magnesium sulfate 1 - 4 g bolus IVPB  4 g Intravenous Once  . metoCLOPramide (REGLAN) injection  10 mg Intravenous Q6H  . metoprolol  5 mg Intravenous Q6H  . pantoprazole (PROTONIX) IV  40 mg Intravenous Q24H  . potassium chloride  40 mEq Oral Daily  . potassium chloride  40 mEq Oral Once  . prednisoLONE acetate  1 drop Right Eye BID  . DISCONTD: bisacodyl  10 mg Rectal Daily   Continuous Infusions:   . 0.9 % NaCl with KCl 40 mEq / L 100 mL/hr at 08/14/11 0010  . fat emulsion 120 kcal (08/13/11 0649)  . TPN (CLINIMIX) +/- additives 65 mL/hr at 08/12/11 1746  . TPN (CLINIMIX) +/- additives 65 mL/hr at 08/13/11 1755   PRN Meds:.albuterol, magic mouthwash, nystatin, ondansetron (ZOFRAN) IV, promethazine, sodium chloride Assessment/Plan:  # Post Op ileus: Ventral hernia s/p laparoscopic lysis of adhesions and hernia repair with mesh 07/29/11.  Ileus waxing and waning   Abd x ray>>> "Increased gaseous distension of multiple loops of large and small bowel most suggestive of ileus.".  UGI series>>>No evidence of gastric outlet obstruction. The stomach and proximal small bowel are moderately distended without demonstrated focal transition point. Uniform wall thickening of the duodenum likely represents edema.   -TPN, NG to suction per Dr. Derrell Lolling.  - advance CL by GI.  - IV mediations for medical problems.  - Etiology of her persistent ileus is not completely clear. ? Postoperative ileus  -Gastroparesis could contribute some. ? Decreased GI motility>>IV Reglan started per GI   # Diarrhea  1 watery diarrhea last night. likely due to her ileus    - C-diff negative  - will hold on medical treatment such as imodium given her ileus.   # Vaginal itching , better  No discharge.  - wet prep genital and vaginal negtaive  - likely yeast infection>>diflucan 150 mg po x1 on 08/10/11   #  Hypomagnesemia and hypokalemia  - K 3.9 and Mg 1.9  - K,Mg eplaced this morning  - follow up BMP and Mg in am   # Leukocytosis:resolved  ? De margination vs ileus / postoperative state vs possible UTI.  -UA Mild pyuria, rare bacteria, negative nitrates  -Urine Cx>>>indicated non clean catch.  - cipro for short course for presumed UTI>>>D/C'd   Afib: Stable, rates ~100 bpm at goal on atenolol 25mg  PO BID, Diltiazem XR 180mg  PO daily, Digoxin 0.25 mg daily.  - full dose lovenox  - switch to IV meds  - coumadin on hold   DM2: Well controlled. A1c- 6.3.  -on TPN.  - Continue lantus and SSI>>>CBG good control   Hypothyroidism: TSH- 0.756. Normal.  - synthroid IV   Hypertension: BP up an down but control ok overall.  -Lopressor IV  -hold Lisinopril for now   Hx of TIA/CVA: Unclear when pt had it. She refuses any Hx though.  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 This was discussed by Dr. Dierdre Searles with Dr. Pearlean Brownie with Neurology.  -Lovenox full dose in place of warfarin while NPO.   Depression: Continue PO Prozac if she can swallow it to avoid SSRI withdrawal  - hold gabapentin and amitriptyline for now since on narcotics for pain. Restart these on discharge.   GERD: Will convert Protonix to IV while NPO with NGT.   DVT Prophylaxis: Lovenox    LOS: 16 days   [name] 08/14/2011, 8:08 AM

## 2011-08-14 NOTE — Progress Notes (Signed)
16 Days Post-Op  Subjective: She continues to feel well he denies nausea, heartburn, cramps, pain he had a couple stools yesterday. His passing flatus frequently. NG output last 24 hours 1625 cc. By mouth intake is measured at 120 but she's probably been taking more.  B-met normal, potassium 3.9.  Objective: Vital signs in last 24 hours: Temp:  [98.6 F (37 C)-99.6 F (37.6 C)] 98.8 F (37.1 C) (06/07 0500) Pulse Rate:  [61-113] 113  (06/07 0500) Resp:  [18-20] 18  (06/07 0500) BP: (141-158)/(65-100) 141/75 mmHg (06/07 0500) SpO2:  [98 %-100 %] 99 % (06/07 0500) Last BM Date: 08/14/11  Intake/Output from previous day: 06/06 0701 - 06/07 0700 In: 3811 [P.O.:120; I.V.:2067; RUE:4540] Out: 3325 [Urine:1700; Emesis/NG output:1625] Intake/Output this shift: Total I/O In: -  Out: 100 [Emesis/NG output:100]  General appearance: alert. Amazingly good spirits. Upbeat. No distress. GI: abdomen is soft. Active bowel sounds. Not distended by exam. Not tympanitic. Nontender. All trocar sites looked good.  Lab Results:  Results for orders placed during the hospital encounter of 07/29/11 (from the past 24 hour(s))  GLUCOSE, CAPILLARY     Status: Abnormal   Collection Time   08/13/11 11:39 AM      Component Value Range   Glucose-Capillary 141 (*) 70 - 99 (mg/dL)   Comment 1 Notify RN    GLUCOSE, CAPILLARY     Status: Abnormal   Collection Time   08/13/11  4:43 PM      Component Value Range   Glucose-Capillary 139 (*) 70 - 99 (mg/dL)   Comment 1 Notify RN    GLUCOSE, CAPILLARY     Status: Abnormal   Collection Time   08/13/11  8:08 PM      Component Value Range   Glucose-Capillary 148 (*) 70 - 99 (mg/dL)  GLUCOSE, CAPILLARY     Status: Abnormal   Collection Time   08/13/11 11:47 PM      Component Value Range   Glucose-Capillary 140 (*) 70 - 99 (mg/dL)  GLUCOSE, CAPILLARY     Status: Abnormal   Collection Time   08/14/11  3:51 AM      Component Value Range   Glucose-Capillary 152 (*) 70  - 99 (mg/dL)  MAGNESIUM     Status: Normal   Collection Time   08/14/11  6:30 AM      Component Value Range   Magnesium 1.9  1.5 - 2.5 (mg/dL)  BASIC METABOLIC PANEL     Status: Abnormal   Collection Time   08/14/11  6:30 AM      Component Value Range   Sodium 134 (*) 135 - 145 (mEq/L)   Potassium 3.9  3.5 - 5.1 (mEq/L)   Chloride 99  96 - 112 (mEq/L)   CO2 25  19 - 32 (mEq/L)   Glucose, Bld 141 (*) 70 - 99 (mg/dL)   BUN 9  6 - 23 (mg/dL)   Creatinine, Ser 9.81  0.50 - 1.10 (mg/dL)   Calcium 9.5  8.4 - 19.1 (mg/dL)   GFR calc non Af Amer >90  >90 (mL/min)   GFR calc Af Amer >90  >90 (mL/min)  GLUCOSE, CAPILLARY     Status: Abnormal   Collection Time   08/14/11  8:14 AM      Component Value Range   Glucose-Capillary 124 (*) 70 - 99 (mg/dL)   Comment 1 Notify RN       Studies/Results: @RISRSLT24 @     . digoxin  0.125 mg  Intravenous Daily  . enoxaparin (LOVENOX) injection  1.5 mg/kg Subcutaneous Q24H  . FLUoxetine  20 mg Oral Daily  . insulin aspart  0-15 Units Subcutaneous Q4H  . insulin glargine  10 Units Subcutaneous QHS  . ketorolac  1 drop Right Eye BID  . levothyroxine  50 mcg Intravenous Daily  . lidocaine  20 mL Mouth/Throat Q4H  . magnesium sulfate 1 - 4 g bolus IVPB  2 g Intravenous Once  . magnesium sulfate 1 - 4 g bolus IVPB  4 g Intravenous Once  . metoCLOPramide (REGLAN) injection  10 mg Intravenous Q6H  . metoprolol  5 mg Intravenous Q6H  . pantoprazole (PROTONIX) IV  40 mg Intravenous Q24H  . potassium chloride  40 mEq Oral Daily  . potassium chloride  40 mEq Oral Once  . prednisoLONE acetate  1 drop Right Eye BID  . DISCONTD: magnesium sulfate LVP 250-500 ml  2 g Intravenous Once     Assessment/Plan: s/p Procedure(s): LAPAROSCOPIC VENTRAL HERNIA INSERTION OF MESH  POD #16. Laparoscopic repair ventral hernia with mesh.  No apparent surgical complication such as infection for mesh complication.  Profound gastroparesis as well as motility disorder  of the lower GI tract. This has been much more persistent and recurrent Dan atypical ileus. Suspect that her diabetes may be influencing this, however she has been never had this problem before.  Appreciate GI input and following with me.  The only apparent option at this time is to treat medically.  Continue TNA. Clamping NG. Allow clear liquids. We'll see how she does.  Consider abdominal x-rays this we can prior to any removal of NG tube.  LOS: 16 days     M. Derrell Lolling, M.D., [facility] General and Minimally invasive Surgery Breast and Colorectal Surgery Office:   [phone] Pager:   [phone]  08/14/2011  . .prob

## 2011-08-14 NOTE — Progress Notes (Signed)
     [facility] Daily Rounding Note 08/14/2011, 10:18 AM  SUBJECTIVE:       NGT output: measured at 1.6 liters. Oral input recorded was 120 cc though not clear this is entirely accurate.  Diarrheal stools 3x yesterday.   No increased distention, no nausea.  NGT makes swallowing painful. Walked in halls yesterday.  Dr Derrell Lolling has initiated complete clamping of NGT.   OBJECTIVE:        General: looks well    Vital signs in last 24 hours:    Temp:  [98.6 F (37 C)-99.6 F (37.6 C)] 98.8 F (37.1 C) (06/07 0500) Pulse Rate:  [106-113] 113  (06/07 0500) Resp:  [18-20] 18  (06/07 0500) BP: (141-158)/(65-100) 141/75 mmHg (06/07 0500) SpO2:  [98 %-99 %] 99 % (06/07 0500) Last BM Date: 08/14/11  Heart: RRR Chest: clear B.  No resp distress or cough Abdomen: soft, hypoactive BS but no tinkling/tympanitic sounds  Extremities: no edema  Neuro/Psych:  Pleasant, not agitated or anxious.   Intake/Output from previous day: 06/06 0701 - 06/07 0700 In: 3811 [P.O.:120; I.V.:2067; ZOX:0960] Out: 3325 [Urine:1700; Emesis/NG output:1625]  Intake/Output this shift: Total I/O In: -  Out: 100 [Emesis/NG output:100]  Lab Results: BMET  Basename 08/14/11 0630 08/13/11 0608 08/12/11 0445  NA 134* 135 134*  K 3.9 4.0 4.1  CL 99 99 100  CO2 25 25 26   GLUCOSE 141* 141* 150*  BUN 9 8 8   CREATININE 0.53 0.46* 0.46*  CALCIUM 9.5 9.5 9.4    PT/INR  Basename 08/13/11 0608 08/12/11 0445  LABPROT 14.0 15.0  INR 1.06 1.16   Studies/Results: No abd films today.   ASSESMENT: * Post op ileus SB, GOO post lap ventral hernia repairs and extensive LOA 5/22.  Ileus and no obstruction on GG study 6/4. KUB this AM improving.  NPO and receiving TNA. NGT in place. Reglan IV resumed yesterday for 8 doses. .  * Bloody appearing NGT material, suspect bleeding from NGT trauma, it is currently non-bloody. On once daily IV Protonix.  * Remote repair of HH. Subsequent c/o dysphagia but no strictures, has been  empirically dilated. On Omeprazole PTA, IV protonix currently.  * IDDM  * Renal insufficiency.  * Fatty liver, has had elevated transaminases in past.  * A fib, chronic coumadin on hold. Coverage with Lovenox.  * Chronic pain syndrome, fibromyalgia. Seems well controlled without narcotics currently.  * Polypharmacy. List of 25 meds/supplements on PTA med list.   PLAN: *  Will leave on Reglan for next 3 days. *  Leave NGT clamped and allow continued clears.  Only resume suction or repeat x-rays if she develops nausea, increased distention.  If tolerates prolonged clamping by 4 or 5 PM today, wonder if we can pull?   *  Leave TNA in place until NGT out and reliably tolerating at least full liquids.    LOS: 16 days   Jennye Moccasin  08/14/2011, 10:18 AM Pager: [phone]

## 2011-08-15 LAB — GLUCOSE, CAPILLARY
Glucose-Capillary: 132 mg/dL — ABNORMAL HIGH (ref 70–99)
Glucose-Capillary: 152 mg/dL — ABNORMAL HIGH (ref 70–99)
Glucose-Capillary: 125 mg/dL — ABNORMAL HIGH (ref 70–99)
Glucose-Capillary: 122 mg/dL — ABNORMAL HIGH (ref 70–99)

## 2011-08-15 MED ORDER — INSULIN REGULAR HUMAN 100 UNIT/ML IJ SOLN
INTRAVENOUS | Status: AC
  Administered 2011-08-15: 18:00:00 via INTRAVENOUS
  Filled 2011-08-15: qty 2000

## 2011-08-15 MED ORDER — LISINOPRIL 10 MG PO TABS
10.0000 mg | ORAL_TABLET | Freq: Every day | ORAL | Status: DC
  Administered 2011-08-15 – 2011-08-16 (×2): 10 mg via ORAL
  Filled 2011-08-15 (×3): qty 1

## 2011-08-15 NOTE — Progress Notes (Signed)
PARENTERAL NUTRITION CONSULT NOTE - FOLLOW UP  Pharmacy Consult for TPN Indication: adynamic Ileus s/p ventral hernia repair   Allergies  Allergen Reactions  . Gemfibrozil Swelling    REACTION: Angioedema  . Latex Other (See Comments)    Blisters where touched or applied  . Penicillins Hives    Will spread in patches all over the body.  . Adhesive (Tape) Other (See Comments)    Will blister skin where applied - do not use BAND-AIDS.  Marland Kitchen Codeine Hives    Will spread in patches all over the body.    Patient Measurements: Height: 5\' 3"  (160 cm) Weight: 193 lb 14.4 oz (87.952 kg) IBW/kg (Calculated) : 52.4  Adjusted Body Weight: 63kg BMI: 34  Vital Signs: Temp: 98.4 F (36.9 C) (06/07 2144) Temp src: Oral (06/07 2144) BP: 141/69 mmHg (06/07 2144) Pulse Rate: 113  (06/07 2144) Intake/Output from previous day: 06/07 0701 - 06/08 0700 In: 2347.8 [I.V.:729.3; TPN:1618.5] Out: 1800 [Urine:1700; Emesis/NG output:100] Intake/Output from this shift:    Labs:  [facility] 08/13/11 0608  WBC --  HGB --  HCT --  PLT --  APTT --  INR 1.06     Basename 08/15/11 0505 08/14/11 0630 08/13/11 0608  NA -- 134* 135  K -- 3.9 4.0  CL -- 99 99  CO2 -- 25 25  GLUCOSE -- 141* 141*  BUN -- 9 8  CREATININE -- 0.53 0.46*  LABCREA -- -- --  CREAT24HRUR -- -- --  CALCIUM -- 9.5 9.5  MG 1.7 1.9 1.8  PHOS -- -- 4.2  PROT -- -- 6.9  ALBUMIN -- -- 2.8*  AST -- -- 19  ALT -- -- 24  ALKPHOS -- -- 92  BILITOT -- -- 0.3  BILIDIR -- -- --  IBILI -- -- --  PREALBUMIN -- -- --  TRIG -- -- --  CHOLHDL -- -- --  CHOL -- -- --   Estimated Creatinine Clearance: 75.7 ml/min (by C-G formula based on Cr of 0.53).    Basename 08/15/11 0731 08/15/11 0353 08/15/11 0006  GLUCAP 152* 132* 154*   Medications:  Scheduled:     . digoxin  0.125 mg Intravenous Daily  . enoxaparin (LOVENOX) injection  1.5 mg/kg Subcutaneous Q24H  . FLUoxetine  20 mg Oral Daily  . insulin aspart  0-15 Units  Subcutaneous Q4H  . insulin glargine  20 Units Subcutaneous QHS  . ketorolac  1 drop Right Eye BID  . levothyroxine  50 mcg Intravenous Daily  . lidocaine  20 mL Mouth/Throat Q4H  . magnesium sulfate 1 - 4 g bolus IVPB  2 g Intravenous Once  . metoCLOPramide (REGLAN) injection  10 mg Intravenous Q6H  . metoprolol  5 mg Intravenous Q6H  . pantoprazole (PROTONIX) IV  40 mg Intravenous Q24H  . potassium chloride  40 mEq Oral Daily  . potassium chloride  40 mEq Oral Once  . prednisoLONE acetate  1 drop Right Eye BID  . DISCONTD: insulin glargine  10 Units Subcutaneous QHS   Infusions:     . 0.9 % NaCl with KCl 40 mEq / L 20 mL/hr (08/14/11 1734)  . fat emulsion 250 mL (08/14/11 1658)  . TPN (CLINIMIX) +/- additives 65 mL/hr at 08/13/11 1755  . TPN (CLINIMIX) +/- additives 65 mL/hr at 08/15/11 0700  . DISCONTD: 0.9 % NaCl with KCl 40 mEq / L 100 mL/hr at 08/14/11 0010   PRN: albuterol, magic mouthwash, nystatin, ondansetron (ZOFRAN) IV, promethazine, sodium  chloride  Insulin Requirements in the past 24 hours:  8 units SSI required with 50 units insulin / BAG TNA, moderate SSI q4h, lantus 20 units QHS (started 6/4, increased 6/7).  Current Nutrition:  Clinimix E5/20 at 3ml/hr (goal - provides weekly average of 1578 kcal and 78g protein/day) Clear liquids >> full liquids starting today  Nutritional Goals:  1750-1970 kCal, 62-74 grams of protein per day  Assessment: 57 yof s/p ventral hernia repair (5/22) with CT abdomin showing adynamic ileus - etiology unclear - ?2/2 surgery or possible diabetic gastroparesis.  UGI showed no evidence of gastric outlet obstruction. NGT placed 5/30 with significant amount of bilious drainage. TNA started 5/30. Pt with positive flatus and bowel movements. PO meds changed back to IV due to worsening ileus. Plan to continue TPN, NG clamped.  Anticoag:  Chronic coumadin for Afib - coumadin on hold.  Full dose LMWH 1.5mg /kg/day.  CBC stable. Hx TIA/CVA.    GI: Post op day #17 s/p ventral hernia repair. CT abdomin showing adynamic ileus +/- gastroparesis/GOO. Pt passing flatus/watery diarrhea and tolerating liquids, abd distention resolved. NGT unclamped, moderate bilious drainage. On reglan through 6/10. Prealbumin improved to 19.8. HX GERD/gastritis on ppi.  Tolerating clear liquid diet, plan to remove NG today and trial full liquid diet. Continue TPN until tolerating full liquid diet.  Endo: Hx Hypothyroidism/DM on synthroid IV (higher than home dose) and TSH wnl. HbA1c 6.3. PO metformin/glipizide on hold d/t inability to swallow.  D/t elevated cBGs.  Lantus dose increased last night and TPN insulin decreased to facilitate transition to PO diet. Lytes:  No electrolytes today. Slow downward trend of K + need to watch - no replacement today.   Renal: Scr stable, UOP 0.8 ml/kg/hr.   I/O: +0.2L Pulm: RA  Cards: Hx Afib/HTN/HLD/CHF (no recent echo), RICA 40-59% stenosed, but no asa per neuro w/ hx TIA/CVA.  BP slightly elevated, tachycardic on IV digoxin, metoprolol, statin  (atenolol, diltiazem, lisinopril, pta po meds on hold).  On full dose lovenox (coumadin on hold).   Hepatobil: Baseline Alk phos/LFTs WNL, Trigs increased at 199 - will follow Neuro: Hx of depression/anxiety on fluoxetine (amytripyline on hold)  ID: Afebrile, WBC decreased to normal, s/p 4 days Cipro for UTI, 1 day fluconzole for yeast infx.    Urine Cx 5/30: 55,000 colonies, Mx spp. Wet prep 6/3: No trick/clue cells, few WBCs CDiff PCR 6/4: negative Cipro 5/30>>6/3 Fluconazole 150mg  x 1: 6/3   Best Practices: Lovenox, po ppi, home meds resumed   Plan:  - Continue Clinimix E5/20 at 2ml/hr (goal)  - IV trace elements, MVI, and lipids 20% MWF due to national shortage  - Continue insulin 50 units in TNA - F/u toleration of NG removal + full liquid diet -F/u CBGs closely to assess TNA insulin requirments d/t concomitant basal insulin -f/u AM BMET, Phos, Mg and CBGs  Lysle Pearl, PharmD, BCPS Pager # [phone] 08/15/2011 8:53 AM

## 2011-08-15 NOTE — Progress Notes (Signed)
Subjective: S/p NG tube removal - patient reports feeling much better. Denies any abdominal pain, N/V/D. She is excited about having icecream!     Objective: Vital signs in last 24 hours: Filed Vitals:   08/13/11 2138 08/14/11 0500 08/14/11 1359 08/14/11 2144  BP: 143/65 141/75 137/72 141/69  Pulse: 106 113 106 113  Temp: 98.6 F (37 C) 98.8 F (37.1 C) 98.8 F (37.1 C) 98.4 F (36.9 C)  TempSrc: Oral Oral Oral Oral  Resp: 18 18 19 18   Height:      Weight:      SpO2: 98% 99% 99% 99%   Weight change:   Intake/Output Summary (Last 24 hours) at 08/15/11 1159 Last data filed at 08/15/11 1035  Gross per 24 hour  Intake 2347.83 ml  Output   1800 ml  Net 547.83 ml   General: NAD. NGT to suction  Cardiac: irregularly irregular rhythm, no M/G/R  Pulm: CTA B/L  Abd: Soft, active BS x 4. Non tender. No distention . Multiple laparoscopic incisions healing well.  Ext: warm and well perfused, no pedal edema. No tenderness or swelling. No erythema.  Neuro: alert and oriented X3, cranial nerves II-XII grossly intact   Lab Results: Basic Metabolic Panel:  Lab 08/15/11 1610 08/14/11 0630 08/13/11 0608 08/12/11 0445  NA -- 134* 135 --  K -- 3.9 4.0 --  CL -- 99 99 --  CO2 -- 25 25 --  GLUCOSE -- 141* 141* --  BUN -- 9 8 --  CREATININE -- 0.53 0.46* --  CALCIUM -- 9.5 9.5 --  MG 1.7 1.9 -- --  PHOS -- -- 4.2 3.8   Liver Function Tests:  Lab 08/13/11 0608 08/10/11 0603  AST 19 25  ALT 24 30  ALKPHOS 92 93  BILITOT 0.3 0.5  PROT 6.9 6.7  ALBUMIN 2.8* 2.7*   CBC:  Lab 08/12/11 0445 08/10/11 0603  WBC 9.7 13.5*  NEUTROABS -- 9.4*  HGB 12.8 13.3  HCT 37.9 39.4  MCV 93.1 93.8  PLT 377 397   CBG:  Lab 08/15/11 1150 08/15/11 0731 08/15/11 0353 08/15/11 0006 08/14/11 2035 08/14/11 1601  GLUCAP 125* 152* 132* 154* 118* 148*   Fasting Lipid Panel:  Lab 08/10/11 0603  CHOL 112  HDL --  LDLCALC --  TRIG 199*  CHOLHDL --  LDLDIRECT --   Coagulation:  Lab  08/13/11 0608 08/12/11 0445 08/11/11 0655 08/10/11 0603  LABPROT 14.0 15.0 15.0 13.6  INR 1.06 1.16 1.16 1.02    Micro Results: Recent Results (from the past 240 hour(s))  URINE CULTURE     Status: Normal   Collection Time   08/06/11  9:00 AM      Component Value Range Status Comment   Specimen Description URINE, CLEAN CATCH   Final    Special Requests NONE   Final    Culture  Setup Time [id number]   Final    Colony Count 55,000 COLONIES/ML   Final    Culture     Final    Value: Multiple bacterial morphotypes present, none predominant. Suggest appropriate recollection if clinically indicated.   Report Status 08/07/2011 FINAL   Final   WET PREP, GENITAL     Status: Abnormal   Collection Time   08/10/11 10:24 AM      Component Value Range Status Comment   Yeast Wet Prep HPF POC NONE SEEN  NONE SEEN  Final SPECIMEN SUBMITTED IN 2 mls OF SALINE   Trich, Wet Prep NONE  SEEN  NONE SEEN  Final    Clue Cells Wet Prep HPF POC NONE SEEN  NONE SEEN  Final    WBC, Wet Prep HPF POC FEW (*) NONE SEEN  Final   CLOSTRIDIUM DIFFICILE BY PCR     Status: Normal   Collection Time   08/11/11  8:54 PM      Component Value Range Status Comment   C difficile by pcr NEGATIVE  NEGATIVE  Final    Studies/Results: No results found. Medications: I have reviewed the patient's current medications. Scheduled Meds:    . digoxin  0.125 mg Intravenous Daily  . enoxaparin (LOVENOX) injection  1.5 mg/kg Subcutaneous Q24H  . FLUoxetine  20 mg Oral Daily  . insulin aspart  0-15 Units Subcutaneous Q4H  . insulin glargine  20 Units Subcutaneous QHS  . ketorolac  1 drop Right Eye BID  . levothyroxine  50 mcg Intravenous Daily  . lidocaine  20 mL Mouth/Throat Q4H  . metoCLOPramide (REGLAN) injection  10 mg Intravenous Q6H  . metoprolol  5 mg Intravenous Q6H  . pantoprazole (PROTONIX) IV  40 mg Intravenous Q24H  . potassium chloride  40 mEq Oral Daily  . prednisoLONE acetate  1 drop Right Eye BID   Continuous  Infusions:    . 0.9 % NaCl with KCl 40 mEq / L 20 mL/hr (08/14/11 1734)  . fat emulsion 250 mL (08/14/11 1658)  . TPN (CLINIMIX) +/- additives 65 mL/hr at 08/13/11 1755  . TPN (CLINIMIX) +/- additives 65 mL/hr at 08/15/11 0700  . TPN (CLINIMIX) +/- additives     PRN Meds:.albuterol, magic mouthwash, nystatin, ondansetron (ZOFRAN) IV, promethazine, sodium chloride Assessment/Plan:  # Post Op ileus: Ventral hernia s/p laparoscopic lysis of adhesions and hernia repair with mesh 07/29/11.  Ileus waxing and waning     -TPN, NG tube removal  - advance from CL to full liquids.  - IV mediations for medical problems.  - Etiology of her persistent ileus is not completely clear. ? Postoperative ileus  -Gastroparesis could contribute some. ? Decreased GI motility>>IV Reglan started per GI   # Diarrhea : Denies any further episodes   - C-diff negative  - will hold on medical treatment such as imodium given her ileus.   # Vaginal itching , better  No discharge.  - wet prep genital and vaginal negtaive  - likely yeast infection>>diflucan 150 mg po x1 on 08/10/11   # Hypomagnesemia and hypokalemia  - K 3.9 and Mg 1.9  - K,Mg eplaced this morning  - follow up BMP and Mg in am   # Leukocytosis:resolved  ? De margination vs ileus / postoperative state vs possible UTI.  -UA Mild pyuria, rare bacteria, negative nitrates  -Urine Cx>>>indicated non clean catch.  - cipro for short course for presumed UTI>>>D/C'd   Afib: Stable, rates ~100 bpm at goal on atenolol 25mg  PO BID, Diltiazem XR 180mg  PO daily, Digoxin 0.25 mg daily.  - full dose lovenox  - continue on IV meds for today  - coumadin on hold   DM2: Well controlled. A1c- 6.3.  -on TPN.  - Continue lantus and SSI>>>CBG good control   Hypothyroidism: TSH- 0.756. Normal.  - synthroid IV   Hypertension: BP up an down but control ok overall.  -Lopressor IV  -start lisinopril   Hx of TIA/CVA: Unclear when pt had it. She refuses any Hx  though.  In any circumstances, as her RICA is 40-59% stenosed, risk of bleeding from  adding ASA exceeds the benefit, on top of coumadin for Afib. This was discussed by Dr. Dierdre Searles with Dr. Pearlean Brownie with Neurology.  -Lovenox full dose in place of warfarin while NPO.   Depression: Continue PO Prozac if she can swallow it to avoid SSRI withdrawal  - hold gabapentin and amitriptyline for now since on narcotics for pain. Restart these on discharge.   GERD:Continue protonix  DVT Prophylaxis: Lovenox    LOS: 17 days   , 08/15/2011, 11:59 AM

## 2011-08-15 NOTE — Progress Notes (Signed)
 Gastroenterology Progress Note  Subjective: Patient reports she feels much better today. No nausea or vomiting. She is tolerating her liquid diet. Her NG tube was removed this morning, and she is very grateful for this. Still with intermittent loose stools, but no melena or bright red blood per rectum  Objective:  Vital signs in last 24 hours: Temp:  [98.4 F (36.9 C)-98.8 F (37.1 C)] 98.4 F (36.9 C) (06/07 2144) Pulse Rate:  [106-113] 113  (06/07 2144) Resp:  [18-19] 18  (06/07 2144) BP: (137-141)/(69-72) 141/69 mmHg (06/07 2144) SpO2:  [99 %] 99 % (06/07 2144) Last BM Date: 08/15/11 Gen: awake, alert, NAD HEENT: anicteric, op clear, NG tube absent CV: RRR, no mrg Pulm: CTA b/l Abd: soft, NT/ND, +BS throughout Ext: Trace pretibial edema Neuro: nonfocal   Intake/Output from previous day: 06/07 0701 - 06/08 0700 In: 2347.8 [I.V.:729.3; TPN:1618.5] Out: 1800 [Urine:1700; Emesis/NG output:100] Intake/Output this shift: Total I/O In: 0  Out: 100 [Urine:100]  Lab Results: No results found for this basename: WBC:3,HGB:3,HCT:3,PLT:3 in the last 72 hours BMET  [facility] 08/14/11 0630 08/13/11 0608  NA 134* 135  K 3.9 4.0  CL 99 99  CO2 25 25  GLUCOSE 141* 141*  BUN 9 8  CREATININE 0.53 0.46*  CALCIUM 9.5 9.5   LFT  Basename 08/13/11 0608  PROT 6.9  ALBUMIN 2.8*  AST 19  ALT 24  ALKPHOS 92  BILITOT 0.3  BILIDIR --  IBILI --   PT/INR  Basename 08/13/11 0608  LABPROT 14.0  INR 1.06     Assessment / Plan: Status post ventral hernia repair, gradually and very slowly improving postoperative ileus. --Hopefully her postoperative ileus has resolved and she will be able to move forward without NG tube decompression. General surgery team has advanced her diet to full liquids For now I recommend continuing the Reglan 4 times a day to promote upper GI motility. I do not want this to be a long-term medication for her She will continue TPN until she can  achieve adequate daily caloric intake Again, I've encouraged that she get out of bed frequently and move around as much as she can. She voices understanding  Principal Problem:  *Ventral hernia s/p laparoscopic lysis of adhesions and hernia repair with mesh 07/29/11 Active Problems:  DIABETES MELLITUS, TYPE II  Atrial fibrillation  Nonspecific (abnormal) findings on radiological and other examination of gastrointestinal tract  Partial bowel obstruction  Hypokalemia  Abdominal pain, generalized  Ileus, postoperative     LOS: 17 days   [name]  08/15/2011, 11:43 AM

## 2011-08-15 NOTE — Progress Notes (Signed)
Ventral hernia  Subjective: She feels much better. She continued to have bowel movements. She has had no nausea with her tube now clamped completely for 48 hours. She wants to try again to have the tube removed and see how she does. She is taking clear liquids but think she can take full liquids.  Objective: Vital signs in last 24 hours: Temp:  [98.4 F (36.9 C)-98.8 F (37.1 C)] 98.4 F (36.9 C) (06/07 2144) Pulse Rate:  [106-113] 113  (06/07 2144) Resp:  [18-19] 18  (06/07 2144) BP: (137-141)/(69-72) 141/69 mmHg (06/07 2144) SpO2:  [99 %] 99 % (06/07 2144) Last BM Date: 08/14/11  Intake/Output from previous day: 06/07 0701 - 06/08 0700 In: 2347.8 [I.V.:729.3; TPN:1618.5] Out: 1800 [Urine:1700; Emesis/NG output:100] Intake/Output this shift:    General appearance: alert, cooperative and no distress Resp: clear to auscultation bilaterally GI: soft and nontender. It is not distended. Bowel sounds are present.  Lab Results:  Results for orders placed during the hospital encounter of 07/29/11 (from the past 24 hour(s))  GLUCOSE, CAPILLARY     Status: Abnormal   Collection Time   08/14/11 12:15 PM      Component Value Range   Glucose-Capillary 137 (*) 70 - 99 (mg/dL)   Comment 1 Notify RN    GLUCOSE, CAPILLARY     Status: Abnormal   Collection Time   08/14/11  4:01 PM      Component Value Range   Glucose-Capillary 148 (*) 70 - 99 (mg/dL)   Comment 1 Notify RN    GLUCOSE, CAPILLARY     Status: Abnormal   Collection Time   08/14/11  8:35 PM      Component Value Range   Glucose-Capillary 118 (*) 70 - 99 (mg/dL)  GLUCOSE, CAPILLARY     Status: Abnormal   Collection Time   08/15/11 12:06 AM      Component Value Range   Glucose-Capillary 154 (*) 70 - 99 (mg/dL)  GLUCOSE, CAPILLARY     Status: Abnormal   Collection Time   08/15/11  3:53 AM      Component Value Range   Glucose-Capillary 132 (*) 70 - 99 (mg/dL)   Comment 1 Documented in Chart     Comment 2 Notify RN    MAGNESIUM      Status: Normal   Collection Time   08/15/11  5:05 AM      Component Value Range   Magnesium 1.7  1.5 - 2.5 (mg/dL)  GLUCOSE, CAPILLARY     Status: Abnormal   Collection Time   08/15/11  7:31 AM      Component Value Range   Glucose-Capillary 152 (*) 70 - 99 (mg/dL)   Comment 1 Notify RN       Studies/Results Radiology     MEDS, Scheduled    . digoxin  0.125 mg Intravenous Daily  . enoxaparin (LOVENOX) injection  1.5 mg/kg Subcutaneous Q24H  . FLUoxetine  20 mg Oral Daily  . insulin aspart  0-15 Units Subcutaneous Q4H  . insulin glargine  20 Units Subcutaneous QHS  . ketorolac  1 drop Right Eye BID  . levothyroxine  50 mcg Intravenous Daily  . lidocaine  20 mL Mouth/Throat Q4H  . magnesium sulfate 1 - 4 g bolus IVPB  2 g Intravenous Once  . metoCLOPramide (REGLAN) injection  10 mg Intravenous Q6H  . metoprolol  5 mg Intravenous Q6H  . pantoprazole (PROTONIX) IV  40 mg Intravenous Q24H  . potassium chloride  40 mEq Oral Daily  . potassium chloride  40 mEq Oral Once  . prednisoLONE acetate  1 drop Right Eye BID  . DISCONTD: insulin glargine  10 Units Subcutaneous QHS     Assessment: Ventral hernia Gradually improving with resolution of her ileus. It appears the Reglan has helped.  Plan: We will DC her NG tube today and allow her to continue liquids. She understands that if she becomes distended again the NG tube will have to be replaced but she thinks that she will be up to tolerate liquids better with it out and has done well with it clamped for 2 days  LOS: 17 days    Currie Paris, MD, [facility], [place] [phone]   08/15/2011 8:33 AM

## 2011-08-16 LAB — BASIC METABOLIC PANEL
BUN: 10 mg/dL (ref 6–23)
CO2: 27 mEq/L (ref 19–32)
Calcium: 9.8 mg/dL (ref 8.4–10.5)
Creatinine, Ser: 0.49 mg/dL — ABNORMAL LOW (ref 0.50–1.10)
GFR calc non Af Amer: >90 mL/min (ref >90)
Glucose, Bld: 160 mg/dL — ABNORMAL HIGH (ref 70–99)
Potassium: 3.9 mEq/L (ref 3.5–5.1)
Sodium: 136 mEq/L (ref 135–145)

## 2011-08-16 LAB — GLUCOSE, CAPILLARY
Glucose-Capillary: 119 mg/dL — ABNORMAL HIGH (ref 70–99)
Glucose-Capillary: 122 mg/dL — ABNORMAL HIGH (ref 70–99)
Glucose-Capillary: 148 mg/dL — ABNORMAL HIGH (ref 70–99)
Glucose-Capillary: 161 mg/dL — ABNORMAL HIGH (ref 70–99)
Glucose-Capillary: 177 mg/dL — ABNORMAL HIGH (ref 70–99)

## 2011-08-16 MED ORDER — WARFARIN - PHARMACIST DOSING INPATIENT: Freq: Every day | Status: DC

## 2011-08-16 MED ORDER — WHITE PETROLATUM GEL
Status: AC
  Administered 2011-08-16: 08:00:00
  Filled 2011-08-16: qty 5

## 2011-08-16 MED ORDER — ATENOLOL 25 MG PO TABS
25.0000 mg | ORAL_TABLET | Freq: Every day | ORAL | Status: DC
  Administered 2011-08-16: 25 mg via ORAL
  Filled 2011-08-16 (×2): qty 1

## 2011-08-16 MED ORDER — CLINIMIX E/DEXTROSE (5/20) 5 % IV SOLN
INTRAVENOUS | Status: AC
  Administered 2011-08-16: 18:00:00 via INTRAVENOUS
  Filled 2011-08-16: qty 1000

## 2011-08-16 MED ORDER — DILTIAZEM HCL ER COATED BEADS 180 MG PO CP24
180.0000 mg | ORAL_CAPSULE | Freq: Every day | ORAL | Status: DC
  Administered 2011-08-16: 180 mg via ORAL
  Filled 2011-08-16 (×2): qty 1

## 2011-08-16 MED ORDER — MAGNESIUM SULFATE 50 % IJ SOLN
4.0000 g | Freq: Once | INTRAVENOUS | Status: AC
  Administered 2011-08-16: 4 g via INTRAVENOUS
  Filled 2011-08-16: qty 8

## 2011-08-16 MED ORDER — LEVOTHYROXINE SODIUM 75 MCG PO TABS
75.0000 ug | ORAL_TABLET | Freq: Every day | ORAL | Status: DC
  Administered 2011-08-17: 75 ug via ORAL
  Filled 2011-08-16 (×2): qty 1

## 2011-08-16 MED ORDER — DIGOXIN 250 MCG PO TABS
0.2500 mg | ORAL_TABLET | Freq: Every day | ORAL | Status: DC
  Administered 2011-08-16: 0.25 mg via ORAL
  Filled 2011-08-16 (×2): qty 1

## 2011-08-16 MED ORDER — WARFARIN SODIUM 5 MG PO TABS
5.0000 mg | ORAL_TABLET | Freq: Once | ORAL | Status: AC
  Administered 2011-08-16: 5 mg via ORAL
  Filled 2011-08-16: qty 1

## 2011-08-16 NOTE — Progress Notes (Signed)
[facility] Progress Note  Subjective: Continues to improve without further abd pain/distention.  No nausea or vomiting.  Tolerating full liquid diet, which is been advanced to a regular diet by surgery today. Weaning off TPN Patient very excited about her progress  Objective:  Vital signs in last 24 hours: Temp:  [98.5 F (36.9 C)-98.8 F (37.1 C)] 98.5 F (36.9 C) (06/09 0456) Pulse Rate:  [98-106] 106  (06/09 0456) Resp:  [18] 18  (06/09 0456) BP: (124-146)/(67-70) 129/68 mmHg (06/09 0456) SpO2:  [100 %] 100 % (06/09 0456) Last BM Date: 08/16/11 Gen: awake, alert, NAD HEENT: anicteric, op clear CV: RRR, no mrg Pulm: CTA b/l Abd: soft, NT/ND, +BS throughout Ext: no c/c/e Neuro: nonfocal   Intake/Output from previous day: 06/08 0701 - 06/09 0700 In: 1780 [P.O.:600; I.V.:160; IV Piggyback:10; TPN:1010] Out: 500 [Urine:500] Intake/Output this shift:    Lab Results: No results found for this basename: WBC:3,HGB:3,HCT:3,PLT:3 in the last 72 hours BMET  Basename 08/16/11 0535 08/14/11 0630  NA 136 134*  K 3.9 3.9  CL 100 99  CO2 27 25  GLUCOSE 160* 141*  BUN 10 9  CREATININE 0.49* 0.53  CALCIUM 9.8 9.5     Assessment / Plan: Status post ventral hernia repair, prolonged hospital stay, but now significant improvement in postoperative ileus --She tolerated fulls, the NG tube has been out for 24 hours, and clamped for 72. This is a very positive sign. Diet has been advanced to regular. --I recommend discontinuation of Reglan for now given improvement. This should not be a long-term medication for her. --Given progress and apparent resolution of ileus, we will sign off for now. We will be available if any questions or concerns.   Principal Problem:  *Ventral hernia s/p laparoscopic lysis of adhesions and hernia repair with mesh 07/29/11 Active Problems:  DIABETES MELLITUS, TYPE II  Atrial fibrillation  Nonspecific (abnormal) findings on radiological  and other examination of gastrointestinal tract  Partial bowel obstruction  Hypokalemia  Abdominal pain, generalized  Ileus, postoperative     LOS: 18 days   [name]  08/16/2011, 12:01 PM

## 2011-08-16 NOTE — Progress Notes (Signed)
ANTICOAGULATION CONSULT NOTE - Initial Consult  Pharmacy Consult for warfarin Indication: atrial fibrillation  Allergies  Allergen Reactions  . Gemfibrozil Swelling    REACTION: Angioedema  . Latex Other (See Comments)    Blisters where touched or applied  . Penicillins Hives    Will spread in patches all over the body.  . Adhesive (Tape) Other (See Comments)    Will blister skin where applied - do not use BAND-AIDS.  Marland Kitchen Codeine Hives    Will spread in patches all over the body.   Patient Measurements: Height: 5\' 3"  (160 cm) Weight: 193 lb 14.4 oz (87.952 kg) IBW/kg (Calculated) : 52.4   Vital Signs: Temp: 98.5 F (36.9 C) (06/09 0456) Temp src: Oral (06/09 0456) BP: 129/68 mmHg (06/09 0456) Pulse Rate: 106  (06/09 0456)  Labs:  Basename 08/16/11 0535 08/14/11 0630  HGB -- --  HCT -- --  PLT -- --  APTT -- --  LABPROT -- --  INR -- --  HEPARINUNFRC -- --  CREATININE 0.49* 0.53  CKTOTAL -- --  CKMB -- --  TROPONINI -- --    Estimated Creatinine Clearance: 75.7 ml/min (by C-G formula based on Cr of 0.49).   Medical History: Past Medical History  Diagnosis Date  . Depression   . Obesity   . Skin neoplasm   . Bunion   . Carotid stenosis   . Fibromyalgia   . Internal hemorrhoid   . GERD (gastroesophageal reflux disease)   . Chronic gastritis   . Transaminase or LDH elevation   . Mild cognitive impairment   . Hyperlipidemia   . Fatty liver   . Lumbar back pain   . Diabetic peripheral neuropathy   . Diverticulosis   . Dysphagia     no documented strictures but responded positively to dilation in past.   . Hypertension   . Cholecystitis     s/p cholecystectomy  . Sarcoidosis   . Fibromyalgia   . CHF (congestive heart failure)   . Skin cancer 1990's    "front of my right leg"  . Atrial fibrillation   . OSA (obstructive sleep apnea) 2003    "can't sleep w/that darm machine"  . Exertional dyspnea   . Hypothyroidism   . Type II diabetes mellitus     . TIA (transient ischemic attack)     07/29/11 pt denies this histor  . H/O hiatal hernia   . Epileptic seizure, tonic     .No meds since age of 54.  . Osteoarthritis     Medications:  Prescriptions prior to admission  Medication Sig Dispense Refill  . ALPRAZolam (XANAX) 0.5 MG tablet Take 1 tablet (0.5 mg total) by mouth at bedtime as needed for sleep or anxiety.  6 tablet  0  . amitriptyline (ELAVIL) 50 MG tablet Take 50 mg by mouth at bedtime.      Marland Kitchen atenolol (TENORMIN) 25 MG tablet Take 25 mg by mouth 2 (two) times daily.      . B Complex-Biotin-FA (SUPER B-50 B COMPLEX PO) Take 1 tablet by mouth 2 (two) times daily.       . Calcium Carbonate-Vitamin D (CALCIUM 600+D) 600-400 MG-UNIT per tablet Take 1 tablet by mouth 2 (two) times daily with a meal.       . cyclobenzaprine (FLEXERIL) 5 MG tablet Take 5 mg by mouth 2 (two) times daily as needed. For fibromyalgia      . digoxin (LANOXIN) 0.25 MG tablet Take 250 mcg  by mouth daily.      Marland Kitchen diltiazem (DILACOR XR) 180 MG 24 hr capsule Take 180 mg by mouth daily.      Marland Kitchen enoxaparin (LOVENOX) 120 MG/0.8ML injection Inject 0.8 mLs (120 mg total) into the skin daily. Take as directed by coumadin clinic, inject daily pre op briding as directed  10 Syringe  1  . fish oil-omega-3 fatty acids 1000 MG capsule Take 1 g by mouth 2 (two) times daily.       Marland Kitchen FLUoxetine (PROZAC) 20 MG capsule Take 20 mg by mouth daily.      . furosemide (LASIX) 40 MG tablet Take 40 mg by mouth daily.      Marland Kitchen gabapentin (NEURONTIN) 100 MG capsule Take 100 mg by mouth 2 (two) times daily.      Marland Kitchen glipiZIDE (GLUCOTROL) 10 MG tablet Take 10 mg by mouth 2 (two) times daily before a meal.      . HYDROcodone-acetaminophen (VICODIN) 5-500 MG per tablet Take 1 tablet by mouth every 12 (twelve) hours as needed. For pain  90 tablet  1  . levothyroxine (SYNTHROID, LEVOTHROID) 75 MCG tablet Take 1 tablet (75 mcg total) by mouth daily.  90 tablet  3  . lisinopril (PRINIVIL,ZESTRIL) 10  MG tablet Take 1 tablet (10 mg total) by mouth daily.  90 tablet  3  . metFORMIN (GLUCOPHAGE) 1000 MG tablet Take 1 tablet (1,000 mg total) by mouth 2 (two) times daily with a meal.  180 tablet  3  . Multiple Vitamin (MULTIVITAMIN) capsule Take 1 capsule by mouth daily.       . niacin (NIASPAN) 500 MG CR tablet Take 2 tablets (1,000 mg total) by mouth 2 (two) times daily.  360 tablet  3  . omeprazole (PRILOSEC) 20 MG capsule Take 40 mg by mouth as needed. For acid reflux      . potassium chloride (K-DUR) 10 MEQ tablet Take 10 mEq by mouth daily.      . pravastatin (PRAVACHOL) 80 MG tablet Take 80 mg by mouth daily.      Marland Kitchen albuterol (PROVENTIL HFA;VENTOLIN HFA) 108 (90 BASE) MCG/ACT inhaler Inhale 2 puffs into the lungs every 6 (six) hours as needed. For shortness of breath      . warfarin (COUMADIN) 5 MG tablet Take 2.5-5 mg by mouth daily. Takes 5 mg on Monday Wednesday and Friday. Takes 2.5mg  on Tuesday, Thursday, Saturday, and Sunday.        Assessment: 61 yof on chronic coumadin for afib. Coumadin has been on hold due to surgery and inability to take PO meds d/t ileus. Pt is now taking PO's so coumadin will resume. Last INR was 1.06. Pt has been on full-dose lovenox while coumadin has been on hold.   Goal of Therapy:  INR 2-3 Monitor platelets by anticoagulation protocol: Yes   Plan:  1. Coumadin 5mg  PO x 1 tonight 2. Daily INR  Criston Chancellor, Drake Leach 08/16/2011,12:49 PM

## 2011-08-16 NOTE — Progress Notes (Signed)
18 Days Post-Op  Subjective: She feels much better today. Her nasogastric tube has been out for 24 hours and she has had no nausea or abdominal distention. She is tolerating full liquids nicely. She's been having bowel movements. She believes that she can advance her diet to solid food. She thinks this is the best she has felt since surgery.  Objective: Vital signs in last 24 hours: Temp:  [98.5 F (36.9 C)-98.8 F (37.1 C)] 98.5 F (36.9 C) (06/09 0456) Pulse Rate:  [98-106] 106  (06/09 0456) Resp:  [18] 18  (06/09 0456) BP: (124-146)/(67-70) 129/68 mmHg (06/09 0456) SpO2:  [100 %] 100 % (06/09 0456)   Intake/Output from previous day: 06/08 0701 - 06/09 0700 In: 1780 [P.O.:600; I.V.:160; IV Piggyback:10; TPN:1010] Out: 500 [Urine:500] Intake/Output this shift:     General appearance: alert, cooperative and no distress GI: soft, non-tender; bowel sounds normal; no masses,  no organomegaly  Incision: healing well  Lab Results:  No results found for this basename: WBC:2,HGB:2,HCT:2,PLT:2 in the last 72 hours BMET  Basename 08/16/11 0535 08/14/11 0630  NA 136 134*  K 3.9 3.9  CL 100 99  CO2 27 25  GLUCOSE 160* 141*  BUN 10 9  CREATININE 0.49* 0.53  CALCIUM 9.8 9.5   PT/INR No results found for this basename: LABPROT:2,INR:2 in the last 72 hours ABG No results found for this basename: PHART:2,PCO2:2,PO2:2,HCO3:2 in the last 72 hours  MEDS, Scheduled    . digoxin  0.125 mg Intravenous Daily  . enoxaparin (LOVENOX) injection  1.5 mg/kg Subcutaneous Q24H  . FLUoxetine  20 mg Oral Daily  . insulin aspart  0-15 Units Subcutaneous Q4H  . insulin glargine  20 Units Subcutaneous QHS  . ketorolac  1 drop Right Eye BID  . levothyroxine  50 mcg Intravenous Daily  . lidocaine  20 mL Mouth/Throat Q4H  . lisinopril  10 mg Oral Daily  . metoCLOPramide (REGLAN) injection  10 mg Intravenous Q6H  . metoprolol  5 mg Intravenous Q6H  . pantoprazole (PROTONIX) IV  40 mg  Intravenous Q24H  . potassium chloride  40 mEq Oral Daily  . prednisoLONE acetate  1 drop Right Eye BID  . white petrolatum        Studies/Results: No results found.  Assessment: s/p Procedure(s): LAPAROSCOPIC VENTRAL HERNIA INSERTION OF MESH Ileus appears to have resolved  Plan: Advance diet I would also begin to wean the TNA   LOS: 18 days     Currie Paris, MD, [facility], [place] [phone]   08/16/2011 9:06 AM

## 2011-08-16 NOTE — Progress Notes (Addendum)
Subjective: Patient feels well. No c/o nausea, vomiting or abdominal pain. Tolerated FL well since NG tube removed yesterday. Patient will start on fat modified diet today by Surgical service.   Objective: Vital signs in last 24 hours: Filed Vitals:   08/14/11 2144 08/15/11 1338 08/15/11 2217 08/16/11 0456  BP: 141/69 146/70 124/67 129/68  Pulse: 113 100 98 106  Temp: 98.4 F (36.9 C) 98.8 F (37.1 C) 98.7 F (37.1 C) 98.5 F (36.9 C)  TempSrc: Oral Oral Oral Oral  Resp: 18 18 18 18   Height:      Weight:      SpO2: 99% 100% 100% 100%   Weight change:   Intake/Output Summary (Last 24 hours) at 08/16/11 0935 Last data filed at 08/15/11 1500  Gross per 24 hour  Intake   1780 ml  Output    500 ml  Net   1280 ml   General: NAD. NGT to suction  Cardiac: irregularly irregular rhythm, no M/G/R  Pulm: CTA B/L  Abd: Soft, active BS x 4. Non tender. No distention . Multiple laparoscopic incisions healing well.  Ext: warm and well perfused, no pedal edema. No tenderness or swelling. No erythema.  Neuro: alert and oriented X3, cranial nerves II-XII grossly intact   Lab Results: Basic Metabolic Panel:  Lab 08/16/11 1610 08/15/11 0505 08/14/11 0630 08/13/11 0608  Anfernee Peschke 136 -- 134* --  K 3.9 -- 3.9 --  CL 100 -- 99 --  CO2 27 -- 25 --  GLUCOSE 160* -- 141* --  BUN 10 -- 9 --  CREATININE 0.49* -- 0.53 --  CALCIUM 9.8 -- 9.5 --  MG 1.6 1.7 -- --  PHOS 4.1 -- -- 4.2   Liver Function Tests:  Lab 08/13/11 0608 08/10/11 0603  AST 19 25  ALT 24 30  ALKPHOS 92 93  BILITOT 0.3 0.5  PROT 6.9 6.7  ALBUMIN 2.8* 2.7*   CBC:  Lab 08/12/11 0445 08/10/11 0603  WBC 9.7 13.5*  NEUTROABS -- 9.4*  HGB 12.8 13.3  HCT 37.9 39.4  MCV 93.1 93.8  PLT 377 397   CBG:  Lab 08/16/11 0730 08/16/11 0427 08/16/11 0004 08/15/11 1954 08/15/11 1644 08/15/11 1150  GLUCAP 122* 161* 148* 131* 122* 125*   Fasting Lipid Panel:  Lab 08/10/11 0603  CHOL 112  HDL --  LDLCALC --  TRIG 199*    CHOLHDL --  LDLDIRECT --   Coagulation:  Lab 08/13/11 0608 08/12/11 0445 08/11/11 0655 08/10/11 0603  LABPROT 14.0 15.0 15.0 13.6  INR 1.06 1.16 1.16 1.02   Micro Results: Recent Results (from the past 240 hour(s))  WET PREP, GENITAL     Status: Abnormal   Collection Time   08/10/11 10:24 AM      Component Value Range Status Comment   Yeast Wet Prep HPF POC NONE SEEN  NONE SEEN  Final SPECIMEN SUBMITTED IN 2 mls OF SALINE   Trich, Wet Prep NONE SEEN  NONE SEEN  Final    Clue Cells Wet Prep HPF POC NONE SEEN  NONE SEEN  Final    WBC, Wet Prep HPF POC FEW (*) NONE SEEN  Final   CLOSTRIDIUM DIFFICILE BY PCR     Status: Normal   Collection Time   08/11/11  8:54 PM      Component Value Range Status Comment   C difficile by pcr NEGATIVE  NEGATIVE  Final    Studies/Results: No results found. Medications: I have reviewed the patient's current  medications. Scheduled Meds:   . atenolol  25 mg Oral Daily  . digoxin  0.25 mg Oral Daily  . diltiazem  180 mg Oral Daily  . enoxaparin (LOVENOX) injection  1.5 mg/kg Subcutaneous Q24H  . FLUoxetine  20 mg Oral Daily  . insulin aspart  0-15 Units Subcutaneous Q4H  . insulin glargine  20 Units Subcutaneous QHS  . ketorolac  1 drop Right Eye BID  . levothyroxine  75 mcg Oral QAC breakfast  . lidocaine  20 mL Mouth/Throat Q4H  . lisinopril  10 mg Oral Daily  . metoCLOPramide (REGLAN) injection  10 mg Intravenous Q6H  . pantoprazole (PROTONIX) IV  40 mg Intravenous Q24H  . potassium chloride  40 mEq Oral Daily  . prednisoLONE acetate  1 drop Right Eye BID  . white petrolatum      . DISCONTD: digoxin  0.125 mg Intravenous Daily  . DISCONTD: levothyroxine  50 mcg Intravenous Daily  . DISCONTD: metoprolol  5 mg Intravenous Q6H   Continuous Infusions:   . 0.9 % NaCl with KCl 40 mEq / L 20 mL/hr (08/14/11 1734)  . fat emulsion 250 mL (08/14/11 1658)  . TPN (CLINIMIX) +/- additives 65 mL/hr at 08/15/11 0700  . TPN (CLINIMIX) +/- additives 65  mL/hr at 08/15/11 1758  . TPN (CLINIMIX) +/- additives     PRN Meds:.albuterol, magic mouthwash, nystatin, ondansetron (ZOFRAN) IV, promethazine, sodium chloride Assessment/Plan:  # Post Op ileus: Ventral hernia s/p laparoscopic lysis of adhesions and hernia repair with mesh 07/29/11.  Ileus waxing and waning during thss admission. >>>resolving now  -TPN weaning, NG tube removal per surgical service - advance from full liquids to regular fat modified diet today per surgical service.  - IV mediations>>change to Oral medication today. -on Reglan 4 times a day to promote upper GI motility>>[facility] does not recs this to be a long-term medication for her ( please refer to Dr. Lauro Franklin note on 08/15/11.)  # Diarrhea : resolved - C-diff negative   # Vaginal itching , resolved No discharge.  - wet prep genital and vaginal negtaive  - likely yeast infection>>diflucan 150 mg po x1 on 08/10/11   # Hypomagnesemia and hypokalemia  - K 3.9 and Mg 1.6  - Mg eplaced this morning  - follow up BMP and Mg in am   # Leukocytosis:resolved  ? De margination vs ileus / postoperative state vs possible UTI.  -UA Mild pyuria, rare bacteria, negative nitrates  -Urine Cx>>>indicated non clean catch.  - cipro for short course for presumed UTI>>>D/C'd   Afib: Stable, rates ~100 bpm at goal on atenolol 25mg  PO BID, Diltiazem XR 180mg  PO daily, Digoxin 0.25 mg daily.  - full dose lovenox  -resume all oral medications - resume coumadin>>>pharmacy to dose  DM2: Well controlled. A1c- 6.3.  -on TPN.  - Continue lantus and SSI>>>CBG good control  - patient will resume all of her home regimen upon discharge  Hypothyroidism: TSH- 0.756. Normal.  - synthroid IV >>>Oral today  Hypertension: BP up an down but control ok overall.  -resume home regimen  Hx of TIA/CVA: Unclear when pt had it. She refuses any Hx though.  In any circumstances, as her RICA is 40-59% stenosed, risk of bleeding from adding ASA exceeds the  benefit, on top of coumadin for Afib. This was discussed by Dr. Dierdre Searles with Dr. Pearlean Brownie with Neurology.  -Lovenox full dose   Depression: Continue PO Prozac if she can swallow it to avoid SSRI  withdrawal  - hold gabapentin and amitriptyline for now since on narcotics for pain. Restart these on discharge.   GERD:Continue protonix   DVT Prophylaxis: Lovenox    LOS: 18 days   Timmya Blazier 08/16/2011, 9:35 AM

## 2011-08-16 NOTE — Progress Notes (Signed)
PARENTERAL NUTRITION CONSULT NOTE - FOLLOW UP  Pharmacy Consult for TPN Indication: adynamic Ileus s/p ventral hernia repair   Allergies  Allergen Reactions  . Gemfibrozil Swelling    REACTION: Angioedema  . Latex Other (See Comments)    Blisters where touched or applied  . Penicillins Hives    Will spread in patches all over the body.  . Adhesive (Tape) Other (See Comments)    Will blister skin where applied - do not use BAND-AIDS.  Marland Kitchen Codeine Hives    Will spread in patches all over the body.    Patient Measurements: Height: 5\' 3"  (160 cm) Weight: 193 lb 14.4 oz (87.952 kg) IBW/kg (Calculated) : 52.4  Adjusted Body Weight: 63kg BMI: 34  Vital Signs: Temp: 98.5 F (36.9 C) (06/09 0456) Temp src: Oral (06/09 0456) BP: 129/68 mmHg (06/09 0456) Pulse Rate: 106  (06/09 0456) Intake/Output from previous day: 06/08 0701 - 06/09 0700 In: 1780 [P.O.:600; I.V.:160; IV Piggyback:10; TPN:1010] Out: 500 [Urine:500] Intake/Output from this shift:    Labs: No results found for this basename: WBC:3,HGB:3,HCT:3,PLT:3,APTT:3,INR:3 in the last 72 hours   Basename 08/16/11 0535 08/15/11 0505 08/14/11 0630  NA 136 -- 134*  K 3.9 -- 3.9  CL 100 -- 99  CO2 27 -- 25  GLUCOSE 160* -- 141*  BUN 10 -- 9  CREATININE 0.49* -- 0.53  LABCREA -- -- --  CREAT24HRUR -- -- --  CALCIUM 9.8 -- 9.5  MG 1.6 1.7 1.9  PHOS 4.1 -- --  PROT -- -- --  ALBUMIN -- -- --  AST -- -- --  ALT -- -- --  ALKPHOS -- -- --  BILITOT -- -- --  BILIDIR -- -- --  IBILI -- -- --  PREALBUMIN -- -- --  TRIG -- -- --  CHOLHDL -- -- --  CHOL -- -- --   Estimated Creatinine Clearance: 75.7 ml/min (by C-G formula based on Cr of 0.49).    Basename 08/16/11 0730 08/16/11 0427 08/16/11 0004  GLUCAP 122* 161* 148*   Medications:  Scheduled:     . digoxin  0.125 mg Intravenous Daily  . enoxaparin (LOVENOX) injection  1.5 mg/kg Subcutaneous Q24H  . FLUoxetine  20 mg Oral Daily  . insulin aspart  0-15  Units Subcutaneous Q4H  . insulin glargine  20 Units Subcutaneous QHS  . ketorolac  1 drop Right Eye BID  . levothyroxine  50 mcg Intravenous Daily  . lidocaine  20 mL Mouth/Throat Q4H  . lisinopril  10 mg Oral Daily  . metoCLOPramide (REGLAN) injection  10 mg Intravenous Q6H  . metoprolol  5 mg Intravenous Q6H  . pantoprazole (PROTONIX) IV  40 mg Intravenous Q24H  . potassium chloride  40 mEq Oral Daily  . prednisoLONE acetate  1 drop Right Eye BID  . white petrolatum       Infusions:     . 0.9 % NaCl with KCl 40 mEq / L 20 mL/hr (08/14/11 1734)  . fat emulsion 250 mL (08/14/11 1658)  . TPN (CLINIMIX) +/- additives 65 mL/hr at 08/15/11 0700  . TPN (CLINIMIX) +/- additives 65 mL/hr at 08/15/11 1758   PRN: albuterol, magic mouthwash, nystatin, ondansetron (ZOFRAN) IV, promethazine, sodium chloride  Insulin Requirements in the past 24 hours:  9 units SSI required with 50 units insulin / BAG TNA, moderate SSI q4h, lantus 20 units QHS (started 6/4, increased 6/7).  Current Nutrition:  Clinimix E5/20 at 49ml/hr (goal - provides weekly average of  1578 kcal and 78g protein/day) Clear liquids >> full liquids>>fat restricted diet starting today  Nutritional Goals:  1750-1970 kCal, 62-74 grams of protein per day  Assessment: 50 yof s/p ventral hernia repair (5/22) with CT abdomin showing adynamic ileus - etiology unclear - ?2/2 surgery or possible diabetic gastroparesis.  UGI showed no evidence of gastric outlet obstruction. NGT placed 5/30 with significant amount of bilious drainage. TNA started 5/30. Pt with positive flatus and bowel movements. PO meds changed back to IV due to worsening ileus. Pt feels great and has not had any nausea or abdominal distention with NG out. She wants to start solid food.  Anticoag:  Chronic coumadin for Afib - coumadin on hold.  Full dose LMWH 1.5mg /kg/day.  CBC stable. Hx TIA/CVA.  GI: Post op day #18 s/p ventral hernia repair. CT abdomin showing adynamic  ileus +/- gastroparesis/GOO.On reglan through 6/10. Prealbumin improved to 19.8. HX GERD/gastritis on ppi.  Tolerating full liquid diet and feels great (no distention, nausea/vomiting) with NG out. Pt wants to eat solid food. Surgery recommended to start weaning TPN.  Endo: Hx Hypothyroidism/DM on synthroid IV (higher than home dose) and TSH wnl. HbA1c 6.3. PO metformin/glipizide on hold d/t inability to swallow. Pt requiring some SSI + lantus + insulin in the TPN - will need to reduce insulin in TNA as TNA starts to wean Lytes:  K+ and Mg slightly below ileus goals however, since ileus seems to be resolved, will hold off on supplementation Renal: Scr stable, UOP 0.2 ml/kg/hr (may not be accurate).   I/O: +2L Pulm: RA  Cards: Hx Afib/HTN/HLD/CHF (no recent echo), RICA 40-59% stenosed, but no asa per neuro w/ hx TIA/CVA.  BP slightly elevated, tachycardic on IV digoxin, metoprolol, statin, lisinopril  (atenolol, diltiazem, some pta po meds still on hold).  On full dose lovenox (coumadin on hold).   Hepatobil: Baseline Alk phos/LFTs WNL, Trigs increased at 199 - will follow Neuro: Hx of depression/anxiety on fluoxetine (amytripyline on hold)  ID: Afebrile, WBC decreased to normal, s/p 4 days Cipro for UTI, 1 day fluconzole for yeast infx.    Urine Cx 5/30: 55,000 colonies, Mx spp. Wet prep 6/3: No trick/clue cells, few WBCs CDiff PCR 6/4: negative Cipro 5/30>>6/3 Fluconazole 150mg  x 1: 6/3   Best Practices: Lovenox, po ppi, home meds resumed   Plan:  - Decrease Clinimix E5/20 at 75ml/hr  - IV trace elements, MVI, and lipids 20% MWF due to national shortage  - Decrease insulin to 20 units in TNA - F/u regular diet toleration and ability to DC TPN - F/u AM labs and CBGs  Lysle Pearl, PharmD, BCPS Pager # [phone] 08/16/2011 9:09 AM

## 2011-08-17 DIAGNOSIS — K439 Ventral hernia without obstruction or gangrene: Secondary | ICD-10-CM

## 2011-08-17 LAB — GLUCOSE, CAPILLARY
Glucose-Capillary: 120 mg/dL — ABNORMAL HIGH (ref 70–99)
Glucose-Capillary: 130 mg/dL — ABNORMAL HIGH (ref 70–99)
Glucose-Capillary: 140 mg/dL — ABNORMAL HIGH (ref 70–99)

## 2011-08-17 MED ORDER — SODIUM CHLORIDE 0.9 % IJ SOLN: 10.0000 mL | INTRAMUSCULAR | Status: DC | PRN

## 2011-08-17 MED ORDER — ALTEPLASE 2 MG IJ SOLR
2.0000 mg | Freq: Once | INTRAMUSCULAR | Status: DC
  Filled 2011-08-17: qty 2

## 2011-08-17 MED ORDER — LOVENOX 150 MG/ML ~~LOC~~ SOLN: 120.0000 mg | Freq: Every day | SUBCUTANEOUS | Status: DC

## 2011-08-17 MED ORDER — METOCLOPRAMIDE HCL 5 MG PO TABS: 5.0000 mg | ORAL_TABLET | Freq: Four times a day (QID) | ORAL | Status: DC

## 2011-08-17 MED ORDER — SODIUM CHLORIDE 0.9 % IJ SOLN: 10.0000 mL | Freq: Two times a day (BID) | INTRAMUSCULAR | Status: DC

## 2011-08-17 NOTE — Progress Notes (Signed)
19 Days Post-Op  Subjective: Good spirits. No complaints. Tolerating regular diet. Had 2 bowel movements. Denies abdominal pain, nausea, or distention. She feels that she can to home.  Objective: Vital signs in last 24 hours: Temp:  [98.2 F (36.8 C)-98.3 F (36.8 C)] 98.2 F (36.8 C) (06/10 0627) Pulse Rate:  [83-101] 101  (06/10 0627) Resp:  [18] 18  (06/10 0627) BP: (98-125)/(59-64) 125/59 mmHg (06/10 0627) SpO2:  [99 %] 99 % (06/10 0627) Last BM Date: 08/16/11  Intake/Output from previous day: 06/09 0701 - 06/10 0700 In: 1771.8 [P.O.:840; I.V.:220; IV Piggyback:8; TPN:703.8] Out: -  Intake/Output this shift:    General appearance: alert. No distress. Mental status normal. Very upbeat. GI: abdomen soft. Flat. Nondistended. Nontender. Wounds looked fine.  Lab Results:  Results for orders placed during the hospital encounter of 07/29/11 (from the past 24 hour(s))  GLUCOSE, CAPILLARY     Status: Abnormal   Collection Time   08/16/11  7:30 AM      Component Value Range   Glucose-Capillary 122 (*) 70 - 99 (mg/dL)   Comment 1 Notify RN    GLUCOSE, CAPILLARY     Status: Abnormal   Collection Time   08/16/11 11:56 AM      Component Value Range   Glucose-Capillary 119 (*) 70 - 99 (mg/dL)   Comment 1 Notify RN    GLUCOSE, CAPILLARY     Status: Abnormal   Collection Time   08/16/11  3:58 PM      Component Value Range   Glucose-Capillary 136 (*) 70 - 99 (mg/dL)   Comment 1 Notify RN    GLUCOSE, CAPILLARY     Status: Abnormal   Collection Time   08/16/11  8:02 PM      Component Value Range   Glucose-Capillary 177 (*) 70 - 99 (mg/dL)  GLUCOSE, CAPILLARY     Status: Abnormal   Collection Time   08/17/11 12:18 AM      Component Value Range   Glucose-Capillary 120 (*) 70 - 99 (mg/dL)  GLUCOSE, CAPILLARY     Status: Abnormal   Collection Time   08/17/11  4:03 AM      Component Value Range   Glucose-Capillary 140 (*) 70 - 99 (mg/dL)     Studies/Results: @[id number] @     .  alteplase  2 mg Intracatheter Once  . atenolol  25 mg Oral Daily  . digoxin  0.25 mg Oral Daily  . diltiazem  180 mg Oral Daily  . enoxaparin (LOVENOX) injection  1.5 mg/kg Subcutaneous Q24H  . FLUoxetine  20 mg Oral Daily  . insulin aspart  0-15 Units Subcutaneous Q4H  . insulin glargine  20 Units Subcutaneous QHS  . ketorolac  1 drop Right Eye BID  . levothyroxine  75 mcg Oral QAC breakfast  . lidocaine  20 mL Mouth/Throat Q4H  . lisinopril  10 mg Oral Daily  . magnesium sulfate LVP 250-500 ml  4 g Intravenous Once  . metoCLOPramide (REGLAN) injection  10 mg Intravenous Q6H  . pantoprazole (PROTONIX) IV  40 mg Intravenous Q24H  . potassium chloride  40 mEq Oral Daily  . prednisoLONE acetate  1 drop Right Eye BID  . sodium chloride  10-40 mL Intracatheter Q12H  . warfarin  5 mg Oral ONCE-1800  . Warfarin - Pharmacist Dosing Inpatient   Does not apply q1800  . white petrolatum      . DISCONTD: digoxin  0.125 mg Intravenous Daily  . DISCONTD:  levothyroxine  50 mcg Intravenous Daily  . DISCONTD: metoprolol  5 mg Intravenous Q6H     Assessment/Plan: s/p Procedure(s): LAPAROSCOPIC VENTRAL HERNIA INSERTION OF MESH  POD #19.  Her ileus and gastroparesis now seemed to finally have resolved. She is ready for discharge home.  She will continue Coumadin and Lovenox to bridge her until she goes to the lumbar Coumadin clinic at the end of this week.  She will be given a prescription for Reglan 5 mg 4 times daily.  We will discontinue the T&A  She will otherwise continue her usual medications.  Hypokalemia.  Protein calorie malnutrition. D/c TNA upon discharge  Leukocytosis. resolved  Atrial fibrillation. Stable. On full dose Lovenox, Management per internal medicine service.   Back on Coumadin and Cardizem  Type 2 diabetes. Reasonable control.  Resume oral medications  Hypothyroidism. TSH normal. On po Synthroid.  Hypertension. Reasonable control  Chronic anxiety and depression.  Clinically doing well emotionally. On Prozac  Somewhat deconditioned. HHPT, HHOT, HHN requested.     LOS: 19 days    [name] M. Derrell Lolling, M.D., [facility] General and Minimally invasive Surgery Breast and Colorectal Surgery Office:   [phone] Pager:   [phone]  08/17/2011  . .prob

## 2011-08-17 NOTE — Progress Notes (Signed)
Patient discharged to home in care of son and [facility]. Medications and instructions reviewed with patient and son with no questions. PICC line removed by IV team. Dressing CDI. Patient is to follow up with Dr. Derrell Lolling in 3 weeks and with [facility] on Friday. Assessment unchanged from this am.

## 2011-08-17 NOTE — Discharge Summary (Signed)
Patient ID: [name] [id number] 61 y.o. 1950-09-05  07/29/2011  Discharge date and time: June 10. 2013  Admitting Physician: Ernestene Mention  Discharge Physician: Ernestene Mention  Admission Diagnoses: ventral hernia   Discharge Diagnoses: multiple incarcerated ventral incisional hernias. Diabetes mellitus, type II Atrial fibrillation on Coumadin Postoperative gastroparesis Postoperative motility disorder Hypertension History of open hiatal hernia repair Anxiety and depression Remote history TIAs. Mild obesity Bilateral inguinal hernias, small, fat-containing, asymptomatic.  Operations: Procedure(s): LAPAROSCOPIC VENTRAL HERNIA INSERTION OF MESH  Admission Condition: good  Discharged Condition: good  Indication for Admission: Operative Indications: [name] is a 61 y.o. female. She was referred by Dr. Baltazar Apo at [facility] internal medicine clinic for evaluation of a ventral hernia. Dr. Antoine Poche is her cardiologist.  This patient had an open hiatal hernia and antireflux repair by Dr. Theron Arista young 23 years ago. She did fairly well from that but occasionally has to have esophageal dilatation. Apparently she has also had a cholecystectomy.  She's noticed a painful bulge slightly to the left of the midline midway between her xiphoid and her umbilicus. She's not had any nausea or vomiting. A CT scan was done on March 1. This shows a small ventral hernia in the upper midline containing fat. There are also bilateral fat-containing inguinal hernias.  Past surgical history includes a hiatal hernia repair, abdominal hysterectomy, and some type of surgery through an infraumbilical incision. She be she also may have had a femoral hernia repair.  Significant comorbidities include atrial fibrillation on Coumadin, TIA, GERD, hypertension, depression, obesity, hyperlipidemia, diabetes. She is on numerous medications. I have examined her and counseled her as an outpatient. She is strongly  motivated to have a ventral hernia repaired. She is admitted electively for ventral hernia repair.. She has been off of her Coumadin for 6 days with a Lovenox bridge.   Hospital Course: on the day of admission the patient was taken to the operating room and underwent a laparoscopic ventral hernia repair with a large piece of physio-mesh, 25 x 30 cm. I found she had multiple defects in the abdominal wall and she also underwent an uneventful lysis of adhesions. Postoperatively the patient initially did well but developed recurrent episodes of massive gastric distention and lower GI tract motility disorder and ileus. This was managed initially with nasogastric tube. It appeared to resolve but then recurred at which time it was managed with NG tube and TNA. We tried Reglan and a variety of maneuvers. It appeared that  it was resolving again and we left the NG tube and let her eat and drink and she became distended again. We involved the [facility] internal medicine teaching service in her care and Dr. Erick Blinks of the [facility]. Eventually the ileus resolved. She was able to tolerate full liquids with NG tube in. We took the NG tube out and she tolerated full liquids and then regular diet for 48 hours without any further problems.  On the day of discharge she was comfortable without any complaints. Her abdomen was soft and not distended with active bowel sounds. All of her wounds looked good. She was having bowel movements. We felt she was finally over her motility disorder.  She was told to continue her Coumadin as prescribed by [facility] and continue her Lovenox 120 mg/day  to bridge her as prescribed and she will [facility] this coming Friday  She will not be given a prescription for narcotic pain medication as that may  be influencing her motility problems. She'll be given a prescription for Reglan 5 mg 4 times a day, before meals and at bedtime. Her TNA will be  discontinued and the PICC line will be removed on the day of discharge.  She was advised in diet and activities. She will return to see Dr. Derrell Lolling in 3 weeks.  HHPT, HHOT, and HHN was requested  Consults: GI , [facility] Teaching [facility] Significant Diagnostic Studies: CT scans  Treatments: surgery: laparoscopic repair of multiple ventral hernias with mesh  Disposition: Home  Patient Instructions:   [place], [place]  Home Medication Instructions ZOX:[id number]   Printed on:08/17/11 8119  Medication Information                    fish oil-omega-3 fatty acids 1000 MG capsule Take 1 g by mouth 2 (two) times daily.            Multiple Vitamin (MULTIVITAMIN) capsule Take 1 capsule by mouth daily.            Calcium Carbonate-Vitamin D (CALCIUM 600+D) 600-400 MG-UNIT per tablet Take 1 tablet by mouth 2 (two) times daily with a meal.            B Complex-Biotin-FA (SUPER B-50 B COMPLEX PO) Take 1 tablet by mouth 2 (two) times daily.            niacin (NIASPAN) 500 MG CR tablet Take 2 tablets (1,000 mg total) by mouth 2 (two) times daily.           HYDROcodone-acetaminophen (VICODIN) 5-500 MG per tablet Take 1 tablet by mouth every 12 (twelve) hours as needed. For pain           levothyroxine (SYNTHROID, LEVOTHROID) 75 MCG tablet Take 1 tablet (75 mcg total) by mouth daily.           lisinopril (PRINIVIL,ZESTRIL) 10 MG tablet Take 1 tablet (10 mg total) by mouth daily.           metFORMIN (GLUCOPHAGE) 1000 MG tablet Take 1 tablet (1,000 mg total) by mouth 2 (two) times daily with a meal.           albuterol (PROVENTIL HFA;VENTOLIN HFA) 108 (90 BASE) MCG/ACT inhaler Inhale 2 puffs into the lungs every 6 (six) hours as needed. For shortness of breath           omeprazole (PRILOSEC) 20 MG capsule Take 40 mg by mouth as needed. For acid reflux           amitriptyline (ELAVIL) 50 MG tablet Take 50 mg by mouth at bedtime.           atenolol (TENORMIN) 25 MG  tablet Take 25 mg by mouth 2 (two) times daily.           cyclobenzaprine (FLEXERIL) 5 MG tablet Take 5 mg by mouth 2 (two) times daily as needed. For fibromyalgia           digoxin (LANOXIN) 0.25 MG tablet Take 250 mcg by mouth daily.           diltiazem (DILACOR XR) 180 MG 24 hr capsule Take 180 mg by mouth daily.           FLUoxetine (PROZAC) 20 MG capsule Take 20 mg by mouth daily.           potassium chloride (K-DUR) 10 MEQ tablet Take 10 mEq by mouth daily.  warfarin (COUMADIN) 5 MG tablet Take 2.5-5 mg by mouth daily. Takes 5 mg on Monday Wednesday and Friday. Takes 2.5mg  on Tuesday, Thursday, Saturday, and Sunday.           gabapentin (NEURONTIN) 100 MG capsule Take 100 mg by mouth 2 (two) times daily.           pravastatin (PRAVACHOL) 80 MG tablet Take 80 mg by mouth daily.           furosemide (LASIX) 40 MG tablet Take 40 mg by mouth daily.           glipiZIDE (GLUCOTROL) 10 MG tablet Take 10 mg by mouth 2 (two) times daily before a meal.           enoxaparin (LOVENOX) 120 MG/0.8ML injection Inject 0.8 mLs (120 mg total) into the skin daily. Take as directed by coumadin clinic, inject daily pre op briding as directed           ALPRAZolam (XANAX) 0.5 MG tablet Take 1 tablet (0.5 mg total) by mouth at bedtime as needed for sleep or anxiety.             Activity: no driving for 2 weeks,  No lifting more than 15 pounds for 6 weeks Diet: diabetic diet Wound Care: none needed  Follow-up:  With Dr. Derrell Lolling in 3 weeks followup in the lower Coumadin clinic this coming Friday, June 14 for regulation of Coumadin and Lovenox. Call to make an appointment with the [facility] internal medicine outpatient clinic for management of her other medical problems..  SignedAngelia Mould. Derrell Lolling, M.D., FACS General and minimally invasive surgery Breast and Colorectal Surgery  08/17/2011, 7:18 AM

## 2011-08-17 NOTE — Progress Notes (Signed)
Internal Medicine Teaching Service Attending Note Date: 08/17/2011  Patient name: [name]  Medical record number: [id number]  Date of birth: 21-Aug-1950    This patient has been seen and discussed with the house staff. Please see their note for complete details. I concur with their findings with the following additions/corrections: Patient is doing very well dressed and ready for DC to home with close fu with [place] in [facility], [facility]. > 30 minutes spent by team in dc of the pateint.  Acey Lav 08/17/2011, 11:56 AM

## 2011-08-17 NOTE — Progress Notes (Signed)
Physical Therapy Treatment Patient Details Name: [name] MRN: [id number] DOB: 06-06-1950 Today's Date: 08/17/2011 Time: 0852-0908 PT Time Calculation (min): 16 min  PT Assessment / Plan / Recommendation Comments on Treatment Session  Pt continues to make excellent progress.  No longer needs assistive device or HHPT.  Ready for dc from PT standpoint.    Follow Up Recommendations  No PT follow up    Barriers to Discharge        Equipment Recommendations  None recommended by PT    Recommendations for Other Services    Frequency     Plan      Precautions / Restrictions Precautions Precautions: None Restrictions Weight Bearing Restrictions: No   Pertinent Vitals/Pain N/A    Mobility  Bed Mobility Supine to Sit: 6: Modified independent (Device/Increase time);With rails Transfers Sit to Stand: 6: Modified independent (Device/Increase time);With upper extremity assist;From toilet;From bed Stand to Sit: 6: Modified independent (Device/Increase time);With upper extremity assist;With armrests;To toilet;To chair/3-in-1 Ambulation/Gait Ambulation/Gait Assistance: 6: Modified independent (Device/Increase time) Ambulation Distance (Feet): 225 Feet Assistive device: None Ambulation/Gait Assistance Details: Steady gait.  No use of assistive device. Gait Pattern: Within Functional Limits    Exercises     PT Diagnosis:    PT Problem List:   PT Treatment Interventions:     PT Goals Acute Rehab PT Goals PT Goal: Supine/Side to Sit - Progress: Progressing toward goal PT Goal: Sit to Stand - Progress: Met PT Goal: Stand to Sit - Progress: Met PT Goal: Ambulate - Progress: Met  Visit Information  Last PT Received On: 08/17/11 Assistance Needed: +1    Subjective Data  Subjective: "Come look at my grandbaby," pt stated as she was skyping with out of town family.   Cognition  Overall Cognitive Status: Appears within functional limits for tasks  assessed/performed Arousal/Alertness: Awake/alert Orientation Level: Appears intact for tasks assessed Behavior During Session: [facility] for tasks performed    Balance  Dynamic Standing Balance Dynamic Standing - Balance Support: During functional activity Dynamic Standing - Level of Assistance: 6: Modified independent (Device/Increase time)  End of Session PT - End of Session Activity Tolerance: Patient tolerated treatment well Patient left: in chair;with call bell/phone within reach Nurse Communication: Mobility status    Adilson Grafton 08/17/2011, 10:37 AM  Skip Mayer PT [phone]

## 2011-08-20 ENCOUNTER — Telehealth (INDEPENDENT_AMBULATORY_CARE_PROVIDER_SITE_OTHER): Payer: Self-pay

## 2011-08-20 NOTE — Telephone Encounter (Signed)
Nurse from [facility] called to let Dr. Derrell Lolling know that the patient has refused all home health services.  She is going back to [place].

## 2011-08-21 ENCOUNTER — Ambulatory Visit (INDEPENDENT_AMBULATORY_CARE_PROVIDER_SITE_OTHER): Payer: Medicare Other | Admitting: General Surgery

## 2011-08-21 ENCOUNTER — Ambulatory Visit (INDEPENDENT_AMBULATORY_CARE_PROVIDER_SITE_OTHER): Payer: Medicare Other | Admitting: Pharmacist

## 2011-08-21 ENCOUNTER — Encounter (INDEPENDENT_AMBULATORY_CARE_PROVIDER_SITE_OTHER): Payer: Self-pay | Admitting: General Surgery

## 2011-08-21 VITALS — BP 118/70 | HR 113 | Temp 97.6°F | Resp 14 | Ht 63.0 in | Wt 171.0 lb

## 2011-08-21 DIAGNOSIS — G459 Transient cerebral ischemic attack, unspecified: Secondary | ICD-10-CM | POA: Diagnosis not present

## 2011-08-21 DIAGNOSIS — I4891 Unspecified atrial fibrillation: Secondary | ICD-10-CM | POA: Diagnosis not present

## 2011-08-21 DIAGNOSIS — Z7901 Long term (current) use of anticoagulants: Secondary | ICD-10-CM

## 2011-08-21 DIAGNOSIS — K439 Ventral hernia without obstruction or gangrene: Secondary | ICD-10-CM

## 2011-08-21 LAB — POCT INR: INR: 1.7

## 2011-08-21 NOTE — Progress Notes (Signed)
Subjective:     Patient ID: [name], female   DOB: Aug 04, 1950, 61 y.o.   MRN: [id number]  HPI  This 61 year old woman who recently underwent elective laparoscopic lysis of adhesions and laparoscopic ventral hernia repair with mesh. 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 This required hyperalimentation nutritional support and nasogastric tube. This finally resolved with manipulation of her medications and Reglan.  She is on Coumadin and is followed closely in the Coumadin clinic with the [place] [facility].  Today she comes in for a previously scheduled appointment. She feels well. Appetite normal. Bowel function normal. No abdominal pain. She was seen in the Coumadin clinic this morning. Review of Systems     Objective:   Physical Exam Patient looks well. She is in good spirits. No distress.  Abdomen soft flat nontender nondistended, wounds healing well,  no sign of recurrent hernia, no sign of fluid collection.    Assessment:     Multiple ventral incisional hernias, status post recent laparoscopic repair with mesh and lysis of adhesions. No apparent direct surgical or wound complications.  Postop gastroparesis and motility disorder, unusually prolonged, now resolved.  Atrial fibrillation on Coumadin  Hypertension  Anxiety and depression  Diabetes mellitus type 2  Severe fibromyalgia    Plan:     Diet and activities discussed. She's about second 30 minute walk daily.  Hydration and diabetic diet.  No lifting more than 15 pounds for 6 weeks  Return to see me in 6 weeks.   Angelia Mould. Derrell Lolling, M.D., [facility] General and Minimally invasive Surgery Breast and Colorectal Surgery Office:   [phone] Pager:   [phone]

## 2011-08-21 NOTE — Patient Instructions (Signed)
You seem to be healing well, and your abdominal exam is unremarkable today.  Be sure to take a walk around the block for 30 minutes every day. You may shower.  You may drive a car.    No lifting more than 15 pounds for the next 6 weeks.  Return to see Dr. Derrell Lolling in 6 weeks.

## 2011-08-24 ENCOUNTER — Ambulatory Visit (INDEPENDENT_AMBULATORY_CARE_PROVIDER_SITE_OTHER): Payer: Medicare Other

## 2011-08-24 DIAGNOSIS — G459 Transient cerebral ischemic attack, unspecified: Secondary | ICD-10-CM

## 2011-08-24 DIAGNOSIS — I4891 Unspecified atrial fibrillation: Secondary | ICD-10-CM | POA: Diagnosis not present

## 2011-08-24 DIAGNOSIS — Z7901 Long term (current) use of anticoagulants: Secondary | ICD-10-CM | POA: Diagnosis not present

## 2011-08-28 DIAGNOSIS — T460X1A Poisoning by cardiac-stimulant glycosides and drugs of similar action, accidental (unintentional), initial encounter: Secondary | ICD-10-CM | POA: Diagnosis not present

## 2011-08-28 DIAGNOSIS — Z79899 Other long term (current) drug therapy: Secondary | ICD-10-CM

## 2011-08-28 DIAGNOSIS — I498 Other specified cardiac arrhythmias: Secondary | ICD-10-CM | POA: Diagnosis present

## 2011-08-28 DIAGNOSIS — E871 Hypo-osmolality and hyponatremia: Secondary | ICD-10-CM | POA: Diagnosis present

## 2011-08-28 DIAGNOSIS — I6529 Occlusion and stenosis of unspecified carotid artery: Secondary | ICD-10-CM | POA: Diagnosis present

## 2011-08-28 DIAGNOSIS — Z85828 Personal history of other malignant neoplasm of skin: Secondary | ICD-10-CM

## 2011-08-28 DIAGNOSIS — I5022 Chronic systolic (congestive) heart failure: Secondary | ICD-10-CM | POA: Diagnosis present

## 2011-08-28 DIAGNOSIS — T460X5A Adverse effect of cardiac-stimulant glycosides and drugs of similar action, initial encounter: Secondary | ICD-10-CM | POA: Diagnosis present

## 2011-08-28 DIAGNOSIS — K219 Gastro-esophageal reflux disease without esophagitis: Secondary | ICD-10-CM | POA: Diagnosis present

## 2011-08-28 DIAGNOSIS — I509 Heart failure, unspecified: Secondary | ICD-10-CM | POA: Diagnosis present

## 2011-08-28 DIAGNOSIS — N179 Acute kidney failure, unspecified: Secondary | ICD-10-CM | POA: Diagnosis not present

## 2011-08-28 DIAGNOSIS — Z9849 Cataract extraction status, unspecified eye: Secondary | ICD-10-CM

## 2011-08-28 DIAGNOSIS — IMO0001 Reserved for inherently not codable concepts without codable children: Secondary | ICD-10-CM | POA: Diagnosis present

## 2011-08-28 DIAGNOSIS — E1142 Type 2 diabetes mellitus with diabetic polyneuropathy: Secondary | ICD-10-CM | POA: Diagnosis present

## 2011-08-28 DIAGNOSIS — I10 Essential (primary) hypertension: Secondary | ICD-10-CM | POA: Diagnosis present

## 2011-08-28 DIAGNOSIS — E785 Hyperlipidemia, unspecified: Secondary | ICD-10-CM | POA: Diagnosis present

## 2011-08-28 DIAGNOSIS — Z961 Presence of intraocular lens: Secondary | ICD-10-CM

## 2011-08-28 DIAGNOSIS — E039 Hypothyroidism, unspecified: Secondary | ICD-10-CM | POA: Diagnosis not present

## 2011-08-28 DIAGNOSIS — E41 Nutritional marasmus: Secondary | ICD-10-CM | POA: Diagnosis present

## 2011-08-28 DIAGNOSIS — E1149 Type 2 diabetes mellitus with other diabetic neurological complication: Secondary | ICD-10-CM | POA: Diagnosis present

## 2011-08-28 DIAGNOSIS — I4891 Unspecified atrial fibrillation: Secondary | ICD-10-CM | POA: Diagnosis present

## 2011-08-28 DIAGNOSIS — F332 Major depressive disorder, recurrent severe without psychotic features: Secondary | ICD-10-CM | POA: Diagnosis present

## 2011-08-28 DIAGNOSIS — E669 Obesity, unspecified: Secondary | ICD-10-CM | POA: Diagnosis present

## 2011-08-28 DIAGNOSIS — K7689 Other specified diseases of liver: Secondary | ICD-10-CM | POA: Diagnosis present

## 2011-08-28 DIAGNOSIS — R0902 Hypoxemia: Secondary | ICD-10-CM | POA: Diagnosis not present

## 2011-08-28 DIAGNOSIS — Z8673 Personal history of transient ischemic attack (TIA), and cerebral infarction without residual deficits: Secondary | ICD-10-CM

## 2011-08-28 DIAGNOSIS — Z7901 Long term (current) use of anticoagulants: Secondary | ICD-10-CM

## 2011-08-28 DIAGNOSIS — G4733 Obstructive sleep apnea (adult) (pediatric): Secondary | ICD-10-CM | POA: Diagnosis present

## 2011-08-29 ENCOUNTER — Encounter (HOSPITAL_COMMUNITY): Payer: Self-pay | Admitting: *Deleted

## 2011-08-29 ENCOUNTER — Emergency Department (HOSPITAL_COMMUNITY): Payer: Medicare Other

## 2011-08-29 ENCOUNTER — Inpatient Hospital Stay (HOSPITAL_COMMUNITY)
Admission: EM | Admit: 2011-08-29 | Discharge: 2011-09-04 | DRG: 682 | Disposition: A | Discharge status: Skilled Nursing Facility | Payer: Medicare Other | Attending: Infectious Disease | Admitting: Infectious Disease

## 2011-08-29 ENCOUNTER — Inpatient Hospital Stay (HOSPITAL_COMMUNITY): Payer: Medicare Other

## 2011-08-29 DIAGNOSIS — R072 Precordial pain: Secondary | ICD-10-CM

## 2011-08-29 DIAGNOSIS — I4891 Unspecified atrial fibrillation: Secondary | ICD-10-CM

## 2011-08-29 DIAGNOSIS — Z9849 Cataract extraction status, unspecified eye: Secondary | ICD-10-CM | POA: Diagnosis not present

## 2011-08-29 DIAGNOSIS — K7689 Other specified diseases of liver: Secondary | ICD-10-CM | POA: Diagnosis present

## 2011-08-29 DIAGNOSIS — N289 Disorder of kidney and ureter, unspecified: Secondary | ICD-10-CM | POA: Diagnosis not present

## 2011-08-29 DIAGNOSIS — F329 Major depressive disorder, single episode, unspecified: Secondary | ICD-10-CM

## 2011-08-29 DIAGNOSIS — R001 Bradycardia, unspecified: Secondary | ICD-10-CM | POA: Diagnosis present

## 2011-08-29 DIAGNOSIS — Z7901 Long term (current) use of anticoagulants: Secondary | ICD-10-CM

## 2011-08-29 DIAGNOSIS — R630 Anorexia: Secondary | ICD-10-CM | POA: Diagnosis not present

## 2011-08-29 DIAGNOSIS — I6529 Occlusion and stenosis of unspecified carotid artery: Secondary | ICD-10-CM | POA: Diagnosis present

## 2011-08-29 DIAGNOSIS — K449 Diaphragmatic hernia without obstruction or gangrene: Secondary | ICD-10-CM

## 2011-08-29 DIAGNOSIS — K929 Disease of digestive system, unspecified: Secondary | ICD-10-CM | POA: Diagnosis not present

## 2011-08-29 DIAGNOSIS — E869 Volume depletion, unspecified: Secondary | ICD-10-CM | POA: Diagnosis not present

## 2011-08-29 DIAGNOSIS — J9819 Other pulmonary collapse: Secondary | ICD-10-CM | POA: Diagnosis not present

## 2011-08-29 DIAGNOSIS — R079 Chest pain, unspecified: Secondary | ICD-10-CM | POA: Diagnosis not present

## 2011-08-29 DIAGNOSIS — I9589 Other hypotension: Secondary | ICD-10-CM | POA: Diagnosis not present

## 2011-08-29 DIAGNOSIS — Z79899 Other long term (current) drug therapy: Secondary | ICD-10-CM | POA: Diagnosis not present

## 2011-08-29 DIAGNOSIS — Z85828 Personal history of other malignant neoplasm of skin: Secondary | ICD-10-CM | POA: Diagnosis not present

## 2011-08-29 DIAGNOSIS — K219 Gastro-esophageal reflux disease without esophagitis: Secondary | ICD-10-CM | POA: Diagnosis present

## 2011-08-29 DIAGNOSIS — Z8673 Personal history of transient ischemic attack (TIA), and cerebral infarction without residual deficits: Secondary | ICD-10-CM | POA: Diagnosis not present

## 2011-08-29 DIAGNOSIS — T460X1A Poisoning by cardiac-stimulant glycosides and drugs of similar action, accidental (unintentional), initial encounter: Secondary | ICD-10-CM | POA: Diagnosis present

## 2011-08-29 DIAGNOSIS — N179 Acute kidney failure, unspecified: Secondary | ICD-10-CM | POA: Diagnosis not present

## 2011-08-29 DIAGNOSIS — R5383 Other fatigue: Secondary | ICD-10-CM | POA: Diagnosis not present

## 2011-08-29 DIAGNOSIS — R509 Fever, unspecified: Secondary | ICD-10-CM | POA: Diagnosis not present

## 2011-08-29 DIAGNOSIS — J4489 Other specified chronic obstructive pulmonary disease: Secondary | ICD-10-CM | POA: Diagnosis not present

## 2011-08-29 DIAGNOSIS — T50905A Adverse effect of unspecified drugs, medicaments and biological substances, initial encounter: Secondary | ICD-10-CM

## 2011-08-29 DIAGNOSIS — Z0000 Encounter for general adult medical examination without abnormal findings: Secondary | ICD-10-CM

## 2011-08-29 DIAGNOSIS — F3289 Other specified depressive episodes: Secondary | ICD-10-CM

## 2011-08-29 DIAGNOSIS — E41 Nutritional marasmus: Secondary | ICD-10-CM | POA: Diagnosis present

## 2011-08-29 DIAGNOSIS — M899 Disorder of bone, unspecified: Secondary | ICD-10-CM | POA: Diagnosis not present

## 2011-08-29 DIAGNOSIS — T50995A Adverse effect of other drugs, medicaments and biological substances, initial encounter: Secondary | ICD-10-CM | POA: Diagnosis not present

## 2011-08-29 DIAGNOSIS — Z961 Presence of intraocular lens: Secondary | ICD-10-CM | POA: Diagnosis not present

## 2011-08-29 DIAGNOSIS — E039 Hypothyroidism, unspecified: Secondary | ICD-10-CM

## 2011-08-29 DIAGNOSIS — F332 Major depressive disorder, recurrent severe without psychotic features: Secondary | ICD-10-CM | POA: Diagnosis present

## 2011-08-29 DIAGNOSIS — IMO0001 Reserved for inherently not codable concepts without codable children: Secondary | ICD-10-CM | POA: Diagnosis present

## 2011-08-29 DIAGNOSIS — R4789 Other speech disturbances: Secondary | ICD-10-CM | POA: Diagnosis not present

## 2011-08-29 DIAGNOSIS — G4733 Obstructive sleep apnea (adult) (pediatric): Secondary | ICD-10-CM | POA: Diagnosis present

## 2011-08-29 DIAGNOSIS — I509 Heart failure, unspecified: Secondary | ICD-10-CM | POA: Diagnosis present

## 2011-08-29 DIAGNOSIS — I952 Hypotension due to drugs: Secondary | ICD-10-CM | POA: Diagnosis present

## 2011-08-29 DIAGNOSIS — I5022 Chronic systolic (congestive) heart failure: Secondary | ICD-10-CM | POA: Diagnosis present

## 2011-08-29 DIAGNOSIS — E871 Hypo-osmolality and hyponatremia: Secondary | ICD-10-CM

## 2011-08-29 DIAGNOSIS — E119 Type 2 diabetes mellitus without complications: Secondary | ICD-10-CM | POA: Diagnosis not present

## 2011-08-29 DIAGNOSIS — I10 Essential (primary) hypertension: Secondary | ICD-10-CM

## 2011-08-29 DIAGNOSIS — E1142 Type 2 diabetes mellitus with diabetic polyneuropathy: Secondary | ICD-10-CM | POA: Diagnosis present

## 2011-08-29 DIAGNOSIS — E785 Hyperlipidemia, unspecified: Secondary | ICD-10-CM

## 2011-08-29 DIAGNOSIS — J449 Chronic obstructive pulmonary disease, unspecified: Secondary | ICD-10-CM | POA: Diagnosis not present

## 2011-08-29 DIAGNOSIS — R5381 Other malaise: Secondary | ICD-10-CM | POA: Diagnosis not present

## 2011-08-29 DIAGNOSIS — E669 Obesity, unspecified: Secondary | ICD-10-CM | POA: Diagnosis present

## 2011-08-29 DIAGNOSIS — R531 Weakness: Secondary | ICD-10-CM

## 2011-08-29 DIAGNOSIS — I498 Other specified cardiac arrhythmias: Secondary | ICD-10-CM | POA: Diagnosis present

## 2011-08-29 DIAGNOSIS — M1990 Unspecified osteoarthritis, unspecified site: Secondary | ICD-10-CM | POA: Diagnosis not present

## 2011-08-29 DIAGNOSIS — E1149 Type 2 diabetes mellitus with other diabetic neurological complication: Secondary | ICD-10-CM | POA: Diagnosis present

## 2011-08-29 HISTORY — DX: Cerebral infarction, unspecified: I63.9

## 2011-08-29 LAB — COMPREHENSIVE METABOLIC PANEL
ALT: 16 U/L (ref 0–35)
AST: 13 U/L (ref 0–37)
Alkaline Phosphatase: 83 U/L (ref 39–117)
BUN: 69 mg/dL — ABNORMAL HIGH (ref 6–23)
CO2: 22 mEq/L (ref 19–32)
GFR calc Af Amer: 6 mL/min — ABNORMAL LOW (ref >90)
GFR calc non Af Amer: 5 mL/min — ABNORMAL LOW (ref >90)
Glucose, Bld: 152 mg/dL — ABNORMAL HIGH (ref 70–99)
Potassium: 4.4 mEq/L (ref 3.5–5.1)
Total Bilirubin: 0.3 mg/dL (ref 0.3–1.2)
Total Protein: 6.8 g/dL (ref 6.0–8.3)

## 2011-08-29 LAB — URINALYSIS, ROUTINE W REFLEX MICROSCOPIC
Glucose, UA: NEGATIVE mg/dL
Hgb urine dipstick: NEGATIVE
Ketones, ur: 15 mg/dL — AB
Nitrite: NEGATIVE
Protein, ur: NEGATIVE mg/dL
Specific Gravity, Urine: 1.02 (ref 1.005–1.030)
pH: 5 (ref 5.0–8.0)

## 2011-08-29 LAB — PROTIME-INR
INR: 2.44 — ABNORMAL HIGH (ref 0.00–1.49)
Prothrombin Time: 26.9 seconds — ABNORMAL HIGH (ref 11.6–15.2)

## 2011-08-29 LAB — CK TOTAL AND CKMB (NOT AT ARMC)
CK, MB: 3.6 ng/mL (ref 0.3–4.0)
Total CK: 35 U/L (ref 7–177)

## 2011-08-29 LAB — GLUCOSE, CAPILLARY
Glucose-Capillary: 107 mg/dL — ABNORMAL HIGH (ref 70–99)
Glucose-Capillary: 109 mg/dL — ABNORMAL HIGH (ref 70–99)
Glucose-Capillary: 112 mg/dL — ABNORMAL HIGH (ref 70–99)
Glucose-Capillary: 134 mg/dL — ABNORMAL HIGH (ref 70–99)
Glucose-Capillary: 88 mg/dL (ref 70–99)

## 2011-08-29 LAB — UREA NITROGEN, URINE: Urea Nitrogen, Ur: 273 mg/dL

## 2011-08-29 LAB — BASIC METABOLIC PANEL
CO2: 18 mEq/L — ABNORMAL LOW (ref 19–32)
Calcium: 8.5 mg/dL (ref 8.4–10.5)
Chloride: 98 mEq/L (ref 96–112)
Creatinine, Ser: 7.32 mg/dL — ABNORMAL HIGH (ref 0.50–1.10)
Creatinine, Ser: 6.8 mg/dL — ABNORMAL HIGH (ref 0.50–1.10)
GFR calc Af Amer: 6 mL/min — ABNORMAL LOW (ref >90)
GFR calc non Af Amer: 5 mL/min — ABNORMAL LOW (ref >90)
Glucose, Bld: 79 mg/dL (ref 70–99)
Glucose, Bld: 104 mg/dL — ABNORMAL HIGH (ref 70–99)
Sodium: 131 mEq/L — ABNORMAL LOW (ref 135–145)

## 2011-08-29 LAB — CBC
HCT: 34.9 % — ABNORMAL LOW (ref 36.0–46.0)
Hemoglobin: 12.2 g/dL (ref 12.0–15.0)
MCH: 31.3 pg (ref 26.0–34.0)
MCHC: 35 g/dL (ref 30.0–36.0)
MCV: 89.5 fL (ref 78.0–100.0)
RBC: 3.9 MIL/uL (ref 3.87–5.11)
RDW: 12.5 % (ref 11.5–15.5)
WBC: 9.5 10*3/uL (ref 4.0–10.5)

## 2011-08-29 LAB — URINE MICROSCOPIC-ADD ON

## 2011-08-29 LAB — DIFFERENTIAL
Basophils Absolute: 0 10*3/uL (ref 0.0–0.1)
Basophils Relative: 0 % (ref 0–1)
Eosinophils Absolute: 0.3 10*3/uL (ref 0.0–0.7)
Eosinophils Relative: 3 % (ref 0–5)
Lymphocytes Relative: 28 % (ref 12–46)
Lymphs Abs: 2.6 10*3/uL (ref 0.7–4.0)
Monocytes Relative: 6 % (ref 3–12)
Neutrophils Relative %: 64 % (ref 43–77)

## 2011-08-29 LAB — MRSA PCR SCREENING: MRSA by PCR: POSITIVE — AB

## 2011-08-29 LAB — CREATININE, URINE, RANDOM: Creatinine, Urine: 278.62 mg/dL

## 2011-08-29 LAB — APTT: aPTT: 43 seconds — ABNORMAL HIGH (ref 24–37)

## 2011-08-29 LAB — TROPONIN I: Troponin I: <0.3 ng/mL (ref <0.30)

## 2011-08-29 LAB — LACTIC ACID, PLASMA: Lactic Acid, Venous: 1.6 mmol/L (ref 0.5–2.2)

## 2011-08-29 MED ORDER — ONDANSETRON HCL 4 MG PO TABS
4.0000 mg | ORAL_TABLET | Freq: Four times a day (QID) | ORAL | Status: DC | PRN
  Administered 2011-09-01 – 2011-09-02 (×3): 4 mg via ORAL
  Filled 2011-08-29 (×4): qty 1

## 2011-08-29 MED ORDER — NIACIN ER 500 MG PO CPCR
1000.0000 mg | ORAL_CAPSULE | Freq: Two times a day (BID) | ORAL | Status: DC
  Administered 2011-08-29: 1000 mg via ORAL
  Filled 2011-08-29 (×2): qty 2

## 2011-08-29 MED ORDER — INSULIN GLARGINE 100 UNIT/ML ~~LOC~~ SOLN
5.0000 [IU] | Freq: Every day | SUBCUTANEOUS | Status: DC
  Administered 2011-08-29 – 2011-09-03 (×6): 5 [IU] via SUBCUTANEOUS

## 2011-08-29 MED ORDER — MUPIROCIN 2 % EX OINT
1.0000 "application " | TOPICAL_OINTMENT | Freq: Two times a day (BID) | CUTANEOUS | Status: AC
  Administered 2011-08-29 – 2011-09-02 (×10): 1 via NASAL
  Filled 2011-08-29 (×2): qty 22

## 2011-08-29 MED ORDER — WARFARIN - PHYSICIAN DOSING INPATIENT: Freq: Every day | Status: DC

## 2011-08-29 MED ORDER — ACETAMINOPHEN 325 MG PO TABS
650.0000 mg | ORAL_TABLET | Freq: Four times a day (QID) | ORAL | Status: DC | PRN
  Administered 2011-08-31 – 2011-09-02 (×4): 650 mg via ORAL
  Filled 2011-08-29 (×4): qty 2

## 2011-08-29 MED ORDER — ALPRAZOLAM 0.5 MG PO TABS: 0.5000 mg | ORAL_TABLET | Freq: Every evening | ORAL | Status: DC | PRN

## 2011-08-29 MED ORDER — AMITRIPTYLINE HCL 50 MG PO TABS
50.0000 mg | ORAL_TABLET | Freq: Every day | ORAL | Status: DC
  Filled 2011-08-29: qty 1

## 2011-08-29 MED ORDER — WARFARIN SODIUM 2.5 MG PO TABS: 2.5000 mg | ORAL_TABLET | Freq: Every day | ORAL | Status: DC

## 2011-08-29 MED ORDER — SODIUM CHLORIDE 0.9 % IV SOLN
INTRAVENOUS | Status: DC
  Administered 2011-08-29 – 2011-08-30 (×7): 1000 mL via INTRAVENOUS
  Administered 2011-08-30: 23:00:00 via INTRAVENOUS
  Administered 2011-08-30: 1000 mL via INTRAVENOUS

## 2011-08-29 MED ORDER — INSULIN ASPART 100 UNIT/ML ~~LOC~~ SOLN: 0.0000 [IU] | Freq: Every day | SUBCUTANEOUS | Status: DC

## 2011-08-29 MED ORDER — ALBUTEROL SULFATE HFA 108 (90 BASE) MCG/ACT IN AERS: 2.0000 | INHALATION_SPRAY | Freq: Four times a day (QID) | RESPIRATORY_TRACT | Status: DC | PRN

## 2011-08-29 MED ORDER — OMEGA-3-ACID ETHYL ESTERS 1 G PO CAPS
1.0000 g | ORAL_CAPSULE | Freq: Two times a day (BID) | ORAL | Status: DC
  Administered 2011-08-29: 1 g via ORAL
  Filled 2011-08-29 (×3): qty 1

## 2011-08-29 MED ORDER — ONDANSETRON HCL 4 MG/2ML IJ SOLN
4.0000 mg | Freq: Four times a day (QID) | INTRAMUSCULAR | Status: DC | PRN
  Administered 2011-08-29: 4 mg via INTRAVENOUS
  Filled 2011-08-29: qty 2

## 2011-08-29 MED ORDER — WARFARIN SODIUM 5 MG PO TABS
5.0000 mg | ORAL_TABLET | ORAL | Status: DC
  Administered 2011-08-31: 5 mg via ORAL
  Filled 2011-08-29 (×2): qty 1

## 2011-08-29 MED ORDER — ADULT MULTIVITAMIN W/MINERALS CH
1.0000 | ORAL_TABLET | Freq: Every day | ORAL | Status: DC
  Administered 2011-08-29: 1 via ORAL
  Filled 2011-08-29: qty 1

## 2011-08-29 MED ORDER — HYDROMORPHONE HCL PF 1 MG/ML IJ SOLN: 1.0000 mg | INTRAMUSCULAR | Status: DC | PRN

## 2011-08-29 MED ORDER — SODIUM CHLORIDE 0.9 % IV BOLUS (SEPSIS)
1000.0000 mL | Freq: Once | INTRAVENOUS | Status: AC
  Administered 2011-08-29: 1000 mL via INTRAVENOUS

## 2011-08-29 MED ORDER — WARFARIN SODIUM 2.5 MG PO TABS
2.5000 mg | ORAL_TABLET | ORAL | Status: DC
  Administered 2011-08-29 – 2011-08-30 (×2): 2.5 mg via ORAL
  Filled 2011-08-29 (×3): qty 1

## 2011-08-29 MED ORDER — LEVOTHYROXINE SODIUM 75 MCG PO TABS
75.0000 ug | ORAL_TABLET | Freq: Every day | ORAL | Status: DC
  Administered 2011-08-29 – 2011-09-04 (×6): 75 ug via ORAL
  Filled 2011-08-29 (×9): qty 1

## 2011-08-29 MED ORDER — FLUOXETINE HCL 20 MG PO CAPS
20.0000 mg | ORAL_CAPSULE | Freq: Every day | ORAL | Status: DC
  Administered 2011-08-29 – 2011-09-01 (×4): 20 mg via ORAL
  Filled 2011-08-29 (×4): qty 1

## 2011-08-29 MED ORDER — ACETAMINOPHEN 650 MG RE SUPP: 650.0000 mg | Freq: Four times a day (QID) | RECTAL | Status: DC | PRN

## 2011-08-29 MED ORDER — ONDANSETRON HCL 4 MG/2ML IJ SOLN: 4.0000 mg | Freq: Three times a day (TID) | INTRAMUSCULAR | Status: DC | PRN

## 2011-08-29 MED ORDER — ATROPINE SULFATE 1 MG/ML IJ SOLN
1.0000 mg | Freq: Once | INTRAMUSCULAR | Status: AC
  Administered 2011-08-29: 1 mg via INTRAVENOUS
  Filled 2011-08-29: qty 1

## 2011-08-29 MED ORDER — SIMVASTATIN 40 MG PO TABS
40.0000 mg | ORAL_TABLET | Freq: Every day | ORAL | Status: DC
  Filled 2011-08-29: qty 1

## 2011-08-29 MED ORDER — SODIUM CHLORIDE 0.9 % IV SOLN
INTRAVENOUS | Status: AC
  Administered 2011-08-29: 1000 mL via INTRAVENOUS

## 2011-08-29 MED ORDER — CHLORHEXIDINE GLUCONATE CLOTH 2 % EX PADS
6.0000 | MEDICATED_PAD | Freq: Every day | CUTANEOUS | Status: AC
  Administered 2011-08-30 – 2011-09-03 (×5): 6 via TOPICAL

## 2011-08-29 MED ORDER — CALCIUM CARBONATE-VITAMIN D 500-200 MG-UNIT PO TABS
1.0000 | ORAL_TABLET | Freq: Two times a day (BID) | ORAL | Status: DC
  Administered 2011-08-29: 1 via ORAL
  Filled 2011-08-29 (×3): qty 1

## 2011-08-29 MED ORDER — B COMPLEX-C PO TABS
1.0000 | ORAL_TABLET | Freq: Every day | ORAL | Status: DC
  Administered 2011-08-29: 1 via ORAL
  Filled 2011-08-29: qty 1

## 2011-08-29 MED ORDER — INSULIN ASPART 100 UNIT/ML ~~LOC~~ SOLN
0.0000 [IU] | Freq: Three times a day (TID) | SUBCUTANEOUS | Status: DC
  Administered 2011-08-30 – 2011-09-02 (×4): 1 [IU] via SUBCUTANEOUS

## 2011-08-29 NOTE — ED Provider Notes (Signed)
History     CSN: [id number]  Arrival date & time 08/28/11  2350   First MD Initiated Contact with Patient 08/29/11 0026      No chief complaint on file.   (Consider location/radiation/quality/duration/timing/severity/associated sxs/prior treatment) Patient is a 61 y.o. female presenting with weakness. The history is provided by the patient. No language interpreter was used.  Weakness The primary symptoms include paresthesias, focal weakness, loss of sensation, speech change and nausea. Primary symptoms do not include headaches, syncope, loss of consciousness, altered mental status, seizures, dizziness, visual change, memory loss, fever or vomiting. The symptoms began 2 days ago. The symptoms are worsening. The neurological symptoms are diffuse. Context: had focal L hand weakness and slurred speech.  Paresthesias began 12 - 24 hours ago. The paresthesias are worsening. Affected locations include the: left upper arm.  Weakness began 12 - 24 hours ago. There is near normal muscle function with maximum physical effort.  There is impairment of the following actions: articulating words and opening a bottle. There is no impairment of the following actions: moving eyes, closing eyes, chewing, swallowing, rotating head or sticking tongue out.   Additional symptoms include weakness. Additional symptoms do not include neck stiffness, lower back pain, leg pain, loss of balance, photophobia, aura or hallucinations.    Past Medical History  Diagnosis Date  . Depression   . Obesity   . Skin neoplasm   . Bunion   . Carotid stenosis   . Fibromyalgia   . Internal hemorrhoid   . GERD (gastroesophageal reflux disease)   . Chronic gastritis   . Transaminase or LDH elevation   . Mild cognitive impairment   . Hyperlipidemia   . Fatty liver   . Lumbar back pain   . Diabetic peripheral neuropathy   . Diverticulosis   . Dysphagia     no documented strictures but responded positively to dilation in  past.   . Hypertension   . Cholecystitis     s/p cholecystectomy  . Sarcoidosis   . Fibromyalgia   . CHF (congestive heart failure)   . Skin cancer 1990's    "front of my right leg"  . Atrial fibrillation   . OSA (obstructive sleep apnea) 2003    "can't sleep w/that darm machine"  . Exertional dyspnea   . Hypothyroidism   . Type II diabetes mellitus   . TIA (transient ischemic attack)     07/29/11 pt denies this histor  . H/O hiatal hernia   . Epileptic seizure, tonic     .No meds since age of 74.  . Osteoarthritis   . Stroke     tia  . A-fib     Past Surgical History  Procedure Date  . Hiatal hernia repair 1990    "had to have scar tissue removed 6 months after repair"  . Esophageal dilation   . Cataract extraction w/phaco 10/27/2010    Procedure: CATARACT EXTRACTION PHACO AND INTRAOCULAR LENS PLACEMENT (IOC);  Surgeon: Susa Simmonds;  Location: [facility];  Service: Ophthalmology;  Laterality: Left;  CDE- 1.78  . Cataract extraction w/phaco 07/13/2011    Procedure: CATARACT EXTRACTION PHACO AND INTRAOCULAR LENS PLACEMENT (IOC);  Surgeon: Susa Simmonds, MD;  Location: [facility];  Service: Ophthalmology;  Laterality: Right;  CDE:  1.65  . Liposuction 1992  . Inverted nipples 1992  . Breast surgery ~ 2010    excision milk duct; right breast  . Foot surgery ~ 2011    ~straightened toe left  foot & scraped bone below big toe"  . Foot surgery ~ 2011    "scraped bone of big toe; shortened middle toe; right foot"  . Cholecystectomy 1980's  . Dilation and curettage of uterus 1970; 1976  . Hernia repair 07/29/11    multiple incarcerated [facility]  . Inguinal hernia repair 1990's    left  . Abdominal hysterectomy 1980's    partial  . Tubal ligation 1976  . Bilateral oophorectomy 1980's    "after partial hysterectomy"  . Skin cancer excision 1990's    "front side of right shin"  . Ventral hernia repair 07/29/2011    Procedure: LAPAROSCOPIC VENTRAL HERNIA;  Surgeon: Ernestene Mention,  MD;  Location: [facility];  Service: General;  Laterality: N/A;  multiple incarcerated hernias with mesh    Family History  Problem Relation Age of Onset  . Crohn's disease Mother   . Colitis Mother     Crohns  . Anesthesia problems Mother   . Cancer Father   . Diabetes Father   . Heart disease Father   . Melanoma Father   . Diabetes Sister   . Depression Maternal Uncle   . Dementia Maternal Uncle   . Hypotension Neg Hx   . Malignant hyperthermia Neg Hx   . Pseudochol deficiency Neg Hx     History  Substance Use Topics  . Smoking status: Never Smoker   . Smokeless tobacco: Never Used  . Alcohol Use: No    OB History    Grav Para Term Preterm Abortions TAB SAB Ect Mult Living                  Review of Systems  Constitutional: Positive for activity change, appetite change and fatigue. Negative for fever.  HENT: Negative for congestion, sore throat, rhinorrhea, neck pain and neck stiffness.   Eyes: Negative for photophobia.  Respiratory: Negative for cough and shortness of breath.   Cardiovascular: Negative for chest pain, palpitations and syncope.  Gastrointestinal: Positive for nausea. Negative for vomiting, abdominal pain, diarrhea and constipation.  Genitourinary: Negative for dysuria, urgency, frequency and flank pain.  Musculoskeletal: Negative for myalgias, back pain and arthralgias.  Neurological: Positive for speech change, focal weakness, speech difficulty, weakness and paresthesias. Negative for dizziness, seizures, loss of consciousness, light-headedness, numbness, headaches and loss of balance.  Psychiatric/Behavioral: Negative for hallucinations, memory loss and altered mental status.  All other systems reviewed and are negative.    Allergies  Gemfibrozil; Latex; Penicillins; Adhesive; and Codeine  Home Medications   Current Outpatient Rx  Name Route Sig Dispense Refill  . ALBUTEROL SULFATE HFA 108 (90 BASE) MCG/ACT IN AERS Inhalation Inhale 2 puffs into  the lungs every 6 (six) hours as needed. For shortness of breath    . ALPRAZOLAM 0.5 MG PO TABS Oral Take 1 tablet (0.5 mg total) by mouth at bedtime as needed for sleep or anxiety. 6 tablet 0  . AMITRIPTYLINE HCL 50 MG PO TABS Oral Take 50 mg by mouth at bedtime.    . ATENOLOL 25 MG PO TABS Oral Take 25 mg by mouth 2 (two) times daily.    . SUPER B-50 B COMPLEX PO Oral Take 1 tablet by mouth 2 (two) times daily.     Marland Kitchen CALCIUM CARBONATE-VITAMIN D 600-400 MG-UNIT PO TABS Oral Take 1 tablet by mouth 2 (two) times daily with a meal.     . CYCLOBENZAPRINE HCL 5 MG PO TABS Oral Take 5 mg by mouth 2 (two) times  daily as needed. For fibromyalgia    . DIGOXIN 0.25 MG PO TABS Oral Take 250 mcg by mouth daily.    Marland Kitchen DILTIAZEM HCL ER 180 MG PO CP24 Oral Take 180 mg by mouth daily.    Marland Kitchen ENOXAPARIN SODIUM 120 MG/0.8ML [place] SOLN Subcutaneous Inject 0.8 mLs (120 mg total) into the skin daily. Take as directed by coumadin clinic, inject daily pre op briding as directed 10 Syringe 1  . OMEGA-3 FATTY ACIDS 1000 MG PO CAPS Oral Take 1 g by mouth 2 (two) times daily.     Marland Kitchen FLUOXETINE HCL 20 MG PO CAPS Oral Take 20 mg by mouth daily.    . FUROSEMIDE 40 MG PO TABS Oral Take 40 mg by mouth daily.    Marland Kitchen GABAPENTIN 100 MG PO CAPS Oral Take 100 mg by mouth 2 (two) times daily.    Marland Kitchen GLIPIZIDE 10 MG PO TABS Oral Take 10 mg by mouth 2 (two) times daily before a meal.    . HYDROCODONE-ACETAMINOPHEN 5-500 MG PO TABS Oral Take 1 tablet by mouth every 12 (twelve) hours as needed. For pain 90 tablet 1  . LEVOTHYROXINE SODIUM 75 MCG PO TABS Oral Take 1 tablet (75 mcg total) by mouth daily. 90 tablet 3  . LISINOPRIL 10 MG PO TABS Oral Take 1 tablet (10 mg total) by mouth daily. 90 tablet 3  . LOVENOX 150 MG/ML  SOLN Subcutaneous Inject 0.8 mLs (120 mg total) into the skin daily. 7 Syringe 1    Dispense as written.  Marland Kitchen METFORMIN HCL 1000 MG PO TABS Oral Take 1 tablet (1,000 mg total) by mouth 2 (two) times daily with a meal. 180 tablet  3  . METOCLOPRAMIDE HCL 5 MG PO TABS Oral Take 1 tablet (5 mg total) by mouth 4 (four) times daily. 50 tablet 1  . MULTIVITAMINS PO CAPS Oral Take 1 capsule by mouth daily.     Marland Kitchen NIACIN ER (ANTIHYPERLIPIDEMIC) 500 MG PO TBCR Oral Take 2 tablets (1,000 mg total) by mouth 2 (two) times daily. 360 tablet 3  . OMEPRAZOLE 20 MG PO CPDR Oral Take 40 mg by mouth as needed. For acid reflux    . POTASSIUM CHLORIDE ER 10 MEQ PO TBCR Oral Take 10 mEq by mouth daily.    Marland Kitchen PRAVASTATIN SODIUM 80 MG PO TABS Oral Take 80 mg by mouth daily.    . WARFARIN SODIUM 5 MG PO TABS Oral Take 2.5-5 mg by mouth daily. Takes 5 mg on Monday Wednesday and Friday. Takes 2.5mg  on Tuesday, Thursday, Saturday, and Sunday.      BP 88/35  Pulse 39  Temp 97.6 F (36.4 C) (Oral)  Resp 17  SpO2 100%  LMP 06/03/1978  Physical Exam  Nursing note and vitals reviewed. Constitutional: She is oriented to person, place, and time. She appears well-developed and well-nourished. No distress.  HENT:  Head: Normocephalic and atraumatic.  Mouth/Throat: Oropharynx is clear and moist.  Eyes: Conjunctivae and EOM are normal. Pupils are equal, round, and reactive to light.  Neck: Normal range of motion. Neck supple.  Cardiovascular: Normal heart sounds and intact distal pulses.  Exam reveals no gallop and no friction rub.   No murmur heard.      Bradycardic rate and irreg irreg rhythm  Pulmonary/Chest: Effort normal and breath sounds normal. No respiratory distress. She exhibits no tenderness.  Abdominal: Soft. Bowel sounds are normal. There is no tenderness. There is no rebound and no guarding.  Well healed laparoscopy incisions  Musculoskeletal: Normal range of motion. She exhibits no edema and no tenderness.  Neurological: She is alert and oriented to person, place, and time. She has normal strength. No cranial nerve deficit or sensory deficit.  Skin: Skin is warm and dry. No rash noted.    ED Course  Procedures (including  critical care time)  CRITICAL CARE Performed by: Dayton Bailiff   Total critical care time: 30 min  Critical care time was exclusive of separately billable procedures and treating other patients.  Critical care was necessary to treat or prevent imminent or life-threatening deterioration.  Critical care was time spent personally by me on the following activities: development of treatment plan with patient and/or surrogate as well as nursing, discussions with consultants, evaluation of patient's response to treatment, examination of patient, obtaining history from patient or surrogate, ordering and performing treatments and interventions, ordering and review of laboratory studies, ordering and review of radiographic studies, pulse oximetry and re-evaluation of patient's condition.   Date: 08/29/2011  Rate: 44  Rhythm: atrial fibrillation  QRS Axis: normal  Intervals: normal  ST/T Wave abnormalities: normal  Conduction Disutrbances:none  Narrative Interpretation:   Old EKG Reviewed: none available  Labs Reviewed  PROTIME-INR - Abnormal; Notable for the following:    Prothrombin Time 25.2 (*)     INR 2.24 (*)     All other components within normal limits  APTT - Abnormal; Notable for the following:    aPTT 43 (*)     All other components within normal limits  CBC - Abnormal; Notable for the following:    HCT 34.9 (*)     All other components within normal limits  COMPREHENSIVE METABOLIC PANEL - Abnormal; Notable for the following:    Sodium 127 (*)     Chloride 89 (*)     Glucose, Bld 152 (*)     BUN 69 (*)     Creatinine, Ser 7.81 (*)     Albumin 3.2 (*)     GFR calc non Af Amer 5 (*)     GFR calc Af Amer 6 (*)     All other components within normal limits  DIGOXIN LEVEL - Abnormal; Notable for the following:    Digoxin Level 3.5 (*)     All other components within normal limits  GLUCOSE, CAPILLARY - Abnormal; Notable for the following:    Glucose-Capillary 134 (*)      All other components within normal limits  DIFFERENTIAL  CK TOTAL AND CKMB  TROPONIN I  LACTIC ACID, PLASMA  URINALYSIS, ROUTINE W REFLEX MICROSCOPIC   No results found.   1. Acute renal failure   2. Hyponatremia   3. Digoxin toxicity       MDM  Chest x-ray and CT scan of the head are pending at this time. Patient has acute renal failure etiologies considered include digoxin toxicity as well as hypovolemia. She received IV fluids with improvement of her symptoms. It also improved her blood pressure. Her heart rate was persistently bradycardic. I administered 1 mg of atropine without improvement. Her digoxin level was 3.5 health right do not feel there is any indication for Digibind at this time. I feel should more likely benefit from dialysis. I discussed the case with the internal medicine teaching service. I also discussed the case with nephrology as I feel the patient will likely warrant dialysis.  She will be admitted to the SDU        Greig Castilla  Brooke Dare, MD 08/29/11 [id number]

## 2011-08-29 NOTE — Consult Note (Signed)
[name] 08/29/2011 Matteus Mcnelly D Requesting Physician:  Dr. Ulyess Mort  Reason for Consult:  Renal failure HPI: The patient is a 61 y.o. year-old with hx of HTN, afib and recent ventral hernia repair.  She presented today with weakness, anorexia and vertigo.  She was just recently discharged a few days ago.  Found to have renal failure, creat 7.8, dig toxicity and hypotension with bradycardia/afib.  Dig level was 3.5. She is on lasix, dig, BB, diltiazem, lisinopril and metformin among other medications. She has been getting fluids and creat is down to 6.8 from 7.8 on admission 12 hours ago.  She has no complaints. Denies OTC meds or NSAID"S.  No N/V.   ROS  no HA, vision chg's  no sorethroat  no SOB or CP  no abd pain  no arthritis, +FM pains  no rash or skin probs  no jerking of ext's or focal wkness   no etoh or tobacco     Creat  Date/Time Value Range Status  05/08/2011  3:47 PM 0.75  0.50 - 1.10 mg/dL Final  16/12/9602  5:40 PM 0.94  0.50 - 1.10 mg/dL Final     Creatinine, Ser  Date/Time Value Range Status  08/29/2011 10:28 AM 6.80* 0.50 - 1.10 mg/dL Final  9/81/1914  7:82 AM 7.32* 0.50 - 1.10 mg/dL Final  9/56/2130 86:57 AM 7.81* 0.50 - 1.10 mg/dL Final  10/10/6960  9:52 AM 0.49* 0.50 - 1.10 mg/dL Final  10/11/1322  4:01 AM 0.53  0.50 - 1.10 mg/dL Final  0/04/7251  6:64 AM 0.46* 0.50 - 1.10 mg/dL Final  4/0/3474  2:59 AM 0.46* 0.50 - 1.10 mg/dL Final  07/12/3873  6:43 AM 0.39* 0.50 - 1.10 mg/dL Final  05/08/9516  8:41 AM 0.49* 0.50 - 1.10 mg/dL Final  08/12/628  1:60 AM 0.44* 0.50 - 1.10 mg/dL Final  1/0/9323  5:57 AM 0.46* 0.50 - 1.10 mg/dL Final  05/28/252  2:70 AM 0.48* 0.50 - 1.10 mg/dL Final  08/30/7626  3:15 AM 0.36* 0.50 - 1.10 mg/dL Final  1/76/1607  3:71 AM 0.50  0.50 - 1.10 mg/dL Final  0/62/6948  5:46 AM 0.52  0.50 - 1.10 mg/dL Final  2/70/3500  9:38 AM 0.48* 0.50 - 1.10 mg/dL Final  1/82/9937  1:69 AM 0.54  0.50 - 1.10 mg/dL Final  6/78/9381  0:17 AM  0.59  0.50 - 1.10 mg/dL Final  07/17/2583  2:77 AM 0.44* 0.50 - 1.10 mg/dL Final  10/31/2351  6:14 AM 0.51  0.50 - 1.10 mg/dL Final  4/31/5400  8:67 PM 0.53  0.50 - 1.10 mg/dL Final  08/08/9507  3:26 PM 0.58  0.50 - 1.10 mg/dL Final  09/18/4578  9:98 PM 0.66  0.50 - 1.10 mg/dL Final  33/10/2503  3:97 PM 0.66  (0.40-1.20 mg/dL Final    Past Medical History:  Past Medical History  Diagnosis Date  . Depression   . Obesity   . Skin neoplasm   . Bunion   . Carotid stenosis   . Fibromyalgia   . Internal hemorrhoid   . GERD (gastroesophageal reflux disease)   . Chronic gastritis   . Transaminase or LDH elevation   . Mild cognitive impairment   . Hyperlipidemia   . Fatty liver   . Lumbar back pain   . Diabetic peripheral neuropathy   . Diverticulosis   . Dysphagia     no documented strictures but responded positively to dilation in past.   . Hypertension   . Cholecystitis  s/p cholecystectomy  . Sarcoidosis   . Fibromyalgia   . CHF (congestive heart failure)   . Skin cancer 1990's    "front of my right leg"  . Atrial fibrillation   . OSA (obstructive sleep apnea) 2003    "can't sleep w/that darm machine"  . Exertional dyspnea   . Hypothyroidism   . Type II diabetes mellitus   . TIA (transient ischemic attack)     07/29/11 pt denies this history  . H/O hiatal hernia   . Epileptic seizure, tonic     .No meds since age of 70.  . Osteoarthritis   . Stroke     tia    Past Surgical History:  Past Surgical History  Procedure Date  . Hiatal hernia repair 1990    "had to have scar tissue removed 6 months after repair"  . Esophageal dilation   . Cataract extraction w/phaco 10/27/2010    Procedure: CATARACT EXTRACTION PHACO AND INTRAOCULAR LENS PLACEMENT (IOC);  Surgeon: Susa Simmonds;  Location: [facility];  Service: Ophthalmology;  Laterality: Left;  CDE- 1.78  . Cataract extraction w/phaco 07/13/2011    Procedure: CATARACT EXTRACTION PHACO AND INTRAOCULAR LENS PLACEMENT (IOC);   Surgeon: Susa Simmonds, MD;  Location: [facility];  Service: Ophthalmology;  Laterality: Right;  CDE:  1.65  . Liposuction 1992  . Inverted nipples 1992  . Breast surgery ~ 2010    excision milk duct; right breast  . Foot surgery ~ 2011    ~straightened toe left foot & scraped bone below big toe"  . Foot surgery ~ 2011    "scraped bone of big toe; shortened middle toe; right foot"  . Cholecystectomy 1980's  . Dilation and curettage of uterus 1970; 1976  . Hernia repair 07/29/11    multiple incarcerated VHR  . Inguinal hernia repair 1990's    left  . Abdominal hysterectomy 1980's    partial  . Tubal ligation 1976  . Bilateral oophorectomy 1980's    "after partial hysterectomy"  . Skin cancer excision 1990's    "front side of right shin"  . Ventral hernia repair 07/29/2011    Procedure: LAPAROSCOPIC VENTRAL HERNIA;  Surgeon: Ernestene Mention, MD;  Location: [facility];  Service: General;  Laterality: N/A;  multiple incarcerated hernias with mesh    Family History:  Family History  Problem Relation Age of Onset  . Crohn's disease Mother   . Colitis Mother     Crohns  . Anesthesia problems Mother   . Cancer Father   . Diabetes Father   . Heart disease Father   . Melanoma Father   . Diabetes Sister   . Depression Maternal Uncle   . Dementia Maternal Uncle   . Hypotension Neg Hx   . Malignant hyperthermia Neg Hx   . Pseudochol deficiency Neg Hx    Social History:  reports that she has never smoked. She has never used smokeless tobacco. She reports that she does not drink alcohol or use illicit drugs.  Allergies:  Allergies  Allergen Reactions  . Gemfibrozil Swelling    REACTION: Angioedema  . Latex Other (See Comments)    Blisters where touched or applied  . Penicillins Hives    Will spread in patches all over the body.  . Adhesive (Tape) Other (See Comments)    Will blister skin where applied - do not use BAND-AIDS.  Marland Kitchen Codeine Hives    Will spread in patches all over the  body.  Home medications: Prior to Admission medications   Medication Sig Start Date End Date Taking? Authorizing Provider  amitriptyline (ELAVIL) 50 MG tablet Take 50 mg by mouth at bedtime. 05/08/11 05/07/12 Yes Amanjot Sidhu, MD  atenolol (TENORMIN) 25 MG tablet Take 25 mg by mouth 2 (two) times daily. 05/08/11 05/07/12 Yes Amanjot Sidhu, MD  digoxin (LANOXIN) 0.25 MG tablet Take 250 mcg by mouth daily. 05/08/11 06/07/12 Yes Amanjot Sidhu, MD  diltiazem (DILACOR XR) 180 MG 24 hr capsule Take 180 mg by mouth daily. 05/08/11 05/07/12 Yes Amanjot Sidhu, MD  enoxaparin (LOVENOX) 120 MG/0.8ML injection Inject 0.8 mLs (120 mg total) into the skin daily. Take as directed by coumadin clinic, inject daily pre op briding as directed 07/20/11  Yes Tonny Bollman, MD  FLUoxetine (PROZAC) 20 MG capsule Take 20 mg by mouth daily. 05/08/11 05/07/12 Yes Amanjot Sidhu, MD  furosemide (LASIX) 40 MG tablet Take 40 mg by mouth daily.   Yes Historical Provider, MD  glipiZIDE (GLUCOTROL) 10 MG tablet Take 10 mg by mouth 2 (two) times daily before a meal.   Yes Historical Provider, MD  HYDROcodone-acetaminophen (VICODIN) 5-500 MG per tablet Take 1 tablet by mouth every 12 (twelve) hours as needed. For pain 05/08/11  Yes Melida Quitter, MD  ketorolac (ACULAR LS) 0.4 % SOLN Apply 1 drop to eye 4 (four) times daily.   Yes Historical Provider, MD  levothyroxine (SYNTHROID, LEVOTHROID) 75 MCG tablet Take 1 tablet (75 mcg total) by mouth daily. 05/08/11  Yes Melida Quitter, MD  lisinopril (PRINIVIL,ZESTRIL) 10 MG tablet Take 1 tablet (10 mg total) by mouth daily. 05/08/11 05/07/12 Yes Amanjot Sidhu, MD  moxifloxacin (VIGAMOX) 0.5 % ophthalmic solution Apply 1 drop to eye 3 (three) times daily.   Yes Historical Provider, MD  niacin (NIASPAN) 500 MG CR tablet Take 2 tablets (1,000 mg total) by mouth 2 (two) times daily. 04/23/11 04/22/12 Yes Amanjot Sidhu, MD  omeprazole (PRILOSEC) 20 MG capsule Take 40 mg by mouth as needed. For acid reflux 02/03/11  02/03/12 Yes Melida Quitter, MD  potassium chloride (K-DUR) 10 MEQ tablet Take 10 mEq by mouth daily. 05/08/11 05/07/12 Yes Amanjot Sidhu, MD  pravastatin (PRAVACHOL) 80 MG tablet Take 80 mg by mouth daily.   Yes Historical Provider, MD  promethazine (PHENERGAN) 25 MG tablet Take 25 mg by mouth every 6 (six) hours as needed.   Yes Historical Provider, MD  warfarin (COUMADIN) 5 MG tablet Take 2.5-5 mg by mouth daily. Takes 5 mg on Monday Wednesday and Friday. Takes 2.5mg  on Tuesday, Thursday, Saturday, and Sunday. 05/08/11  Yes Amanjot Sidhu, MD  albuterol (PROVENTIL HFA;VENTOLIN HFA) 108 (90 BASE) MCG/ACT inhaler Inhale 2 puffs into the lungs every 6 (six) hours as needed. For shortness of breath 02/03/11 03/04/12  Melida Quitter, MD  ALPRAZolam Prudy Feeler) 0.5 MG tablet Take 1 tablet (0.5 mg total) by mouth at bedtime as needed for sleep or anxiety. 07/23/11 07/22/12  Melida Quitter, MD  B Complex-Biotin-FA (SUPER B-50 B COMPLEX PO) Take 1 tablet by mouth 2 (two) times daily.     Historical Provider, MD  Calcium Carbonate-Vitamin D (CALCIUM 600+D) 600-400 MG-UNIT per tablet Take 1 tablet by mouth 2 (two) times daily with a meal.     Historical Provider, MD  cyclobenzaprine (FLEXERIL) 5 MG tablet Take 5 mg by mouth 2 (two) times daily as needed. For fibromyalgia 05/08/11   Melida Quitter, MD  fish oil-omega-3 fatty acids 1000 MG capsule Take 1 g by mouth 2 (two) times  daily.     Historical Provider, MD  gabapentin (NEURONTIN) 100 MG capsule Take 100 mg by mouth 2 (two) times daily. 05/08/11   Melida Quitter, MD  LOVENOX 150 MG/ML injection Inject 0.8 mLs (120 mg total) into the skin daily. 08/17/11   Ernestene Mention, MD  metFORMIN (GLUCOPHAGE) 1000 MG tablet Take 1 tablet (1,000 mg total) by mouth 2 (two) times daily with a meal. 05/08/11 05/07/12  Melida Quitter, MD  metoCLOPramide (REGLAN) 5 MG tablet Take 1 tablet (5 mg total) by mouth 4 (four) times daily. 08/17/11 08/27/11  Ernestene Mention, MD  Multiple Vitamin  (MULTIVITAMIN) capsule Take 1 capsule by mouth daily.     Historical Provider, MD    Inpatient medications:    . sodium chloride   Intravenous STAT  . atropine  1 mg Intravenous Once  . Chlorhexidine Gluconate Cloth  6 each Topical Q0600  . FLUoxetine  20 mg Oral Daily  . insulin aspart  0-5 Units Subcutaneous QHS  . insulin aspart  0-9 Units Subcutaneous TID WC  . insulin glargine  5 Units Subcutaneous QHS  . levothyroxine  75 mcg Oral Q0600  . mupirocin ointment  1 application Nasal BID  . sodium chloride  1,000 mL Intravenous Once  . sodium chloride  1,000 mL Intravenous Once  . warfarin  2.5 mg Oral Q T,Th,S,Su-1800  . warfarin  5 mg Oral Q M,W,F-1800  . Warfarin - Physician Dosing Inpatient   Does not apply q1800  . DISCONTD: amitriptyline  50 mg Oral QHS  . DISCONTD: B-complex with vitamin C  1 tablet Oral Daily  . DISCONTD: calcium-vitamin D  1 tablet Oral BID WC  . DISCONTD: multivitamin with minerals  1 tablet Oral Daily  . DISCONTD: niacin  1,000 mg Oral BID  . DISCONTD: omega-3 acid ethyl esters  1 g Oral BID WC  . DISCONTD: simvastatin  40 mg Oral q1800  . DISCONTD: warfarin  2.5-5 mg Oral Daily     Labs: Basic Metabolic Panel:  Lab 08/29/11 1610 08/29/11 0418 08/29/11 0014  NA 130* 131* 127*  K 4.1 4.0 4.4  CL 98 97 89*  CO2 18* 18* 22  GLUCOSE 104* 79 152*  BUN 66* 66* 69*  CREATININE 6.80* 7.32* 7.81*  ALB -- -- --  CALCIUM 8.4 8.5 9.5  PHOS -- -- --   Liver Function Tests:  Lab 08/29/11 0014  AST 13  ALT 16  ALKPHOS 83  BILITOT 0.3  PROT 6.8  ALBUMIN 3.2*   No results found for this basename: LIPASE:3,AMYLASE:3 in the last 168 hours No results found for this basename: AMMONIA:3 in the last 168 hours CBC:  Lab 08/29/11 0014  WBC 9.5  NEUTROABS 6.0  HGB 12.2  HCT 34.9*  MCV 89.5  PLT 327   PT/INR: @labrcntip (inr:5) Cardiac Enzymes:  Lab 08/29/11 0015  CKTOTAL 35  CKMB 3.6  CKMBINDEX --  TROPONINI <0.30   CBG:  Lab 08/29/11  1615 08/29/11 1210 08/29/11 0739 08/29/11 0052  GLUCAP 107* 109* 88 134*    Iron Studies: No results found for this basename: IRON:30,TIBC:30,TRANSFERRIN:30,FERRITIN:30 in the last 168 hours  Xrays/Other Studies: Dg Chest 2 View  08/29/2011  *RADIOLOGY REPORT*  Clinical Data: Weakness and lethargy.  History of diabetes.  CHEST - 2 VIEW  Comparison: Chest radiograph performed 08/06/2011  Findings: The lungs are well-aerated and clear.  There is no evidence of focal opacification, pleural effusion or pneumothorax.  The heart is borderline normal  in size; calcification is noted within the aortic arch.  No acute osseous abnormalities are seen. Clips are noted within the right upper quadrant, reflecting prior cholecystectomy.  IMPRESSION: No acute cardiopulmonary process seen.  Original Report Authenticated By: Tonia Ghent, M.D.   Ct Head Wo Contrast  08/29/2011  *RADIOLOGY REPORT*  Clinical Data: Weakness and slurred speech.  Hypotension.  CT HEAD WITHOUT CONTRAST  Technique:  Contiguous axial images were obtained from the base of the skull through the vertex without contrast.  Comparison: CT of the head performed 12/18/2007  Findings: There is no evidence of acute infarction, mass lesion, or intra- or extra-axial hemorrhage on CT.  Minimal beam-hardening artifact is noted at the high left frontoparietal region.  The posterior fossa, including the cerebellum, brainstem and fourth ventricle, is within normal limits.  The third and lateral ventricles, and basal ganglia are unremarkable in appearance.  The cerebral hemispheres are symmetric in appearance, with normal gray- white differentiation.  No mass effect or midline shift is seen.  There is no evidence of fracture; visualized osseous structures are unremarkable in appearance.  The orbits are within normal limits. The paranasal sinuses and mastoid air cells are well-aerated.  No significant soft tissue abnormalities are seen.  IMPRESSION: Unremarkable  noncontrast CT of the head.  Original Report Authenticated By: Tonia Ghent, M.D.    Physical Exam:  Blood pressure 97/51, pulse 53, temperature 97.8 F (36.6 C), temperature source Oral, resp. rate 18, height 5\' 3"  (1.6 m), weight 78.926 kg (174 lb), last menstrual period 06/03/1978, SpO2 96.00%.  Gen: alert, looks tired, not in distress, fully oriented, calm Skin: no rash, cyanosis HEENT:  EOMI, sclera anicteric, throat clear and moist Neck: no JVD, no bruits or LAN Chest: clear bilat, no rales or rhonchi Heart: regular, no rub or gallop, no murmur Abdomen: soft, nontender, + BS, no HSM Ext: no LE or UE edema Neuro: alert, Ox3, no focal deficit, no asterixis Heme/Lymph: as above  UA- no protein, 3-6wbc, 0-2rbc 08/16/2011- Creat 0.49   Impression/Plan 1. Acute renal failure due to ACEI/vol depletion- doubt ATN, agree with fluid volume resuscitation, holding ACEI and other BP meds as needed.  Expect recovery. Will follow. Creat was normal on 6/9.   2. Recent VH repair - complicated by ileus, in hosp 2-3 weeks 3. Afib - per primary 4. Dig toxicity - pre primary 5. Vol depletion - getting IVF"s.   Vinson Moselle  MD [facility] [phone] pgr    [phone] cell 08/29/2011, 6:39 PM

## 2011-08-29 NOTE — Progress Notes (Signed)
At change of shift this am (0705) pt experienced episode of symptomatic bradycardia (HR 30s; BP 80s/40s; nausea) Gave 4mg  zofran IV; pt started feeling better soon after.  Pt currently stable.  MD notified.  Will continue to monitor.  Temporary pacemaker at bedside.  Salomon Mast, RN

## 2011-08-29 NOTE — Progress Notes (Signed)
Contacted Janalyn Harder, MD regarding pt output (only 250 mL out since 3am and receiving 260mL/hr NS).  Will continue to monitor. MD said he would come up to see the pt.  Salomon Mast

## 2011-08-29 NOTE — ED Notes (Signed)
IV RN at bedside for 2nd PIV line

## 2011-08-29 NOTE — ED Notes (Signed)
Dr King notified of pt status.

## 2011-08-29 NOTE — ED Notes (Signed)
Pt was d/c from [facility] 6/10 after being tx for hernia surgery.  For the past few days she has been experiencing decreased appetite.  Today her friend noticed around 1600 that she was lethargic and not responding correctly to questions.  About 8 pm he noticed that she was dropping things.

## 2011-08-29 NOTE — H&P (Signed)
IM Attending on-call  77 woman admitted for few days of weakness, vertigo, and profound anorexia.  Has only been home few days after admission for ventral hernia surgery.  Found to have hypotension, bradycardia, creatinine of 7.8 (was 0.5 2 weeks ago), and dig level of 3.5.  She has been taking dig, diltiazem, and atenolol. Since admission, we are holding these and most other meds.  Repeat Bmet few hours later was slightly improved (creat 7.8 to 7.3).  Ventr response remains slow (30s to 40s) and BP is soft.  She has no other sx and no abdominal pain or tenderness.  Wound looks good.  Afrbrile. Impression:  Profound anorexia since surgery w/o evident cause.  This has led to ARF, hopefully all pre-renal.  UA does not suggest ATN.   Rx:  Agree with holding all dromotropic meds and vigorous volume repletion.  Repeat Bmet later this AM -- if improving, carry on.  Watch O2 sat to avoid over-hydration.  Id Creatinine does not continue to improve, notify renal service.  Keep on close monitoring until rate and BP are stable and creatinine is clearly correcting. If rate and/or BP become emergent, options include Digibind antibody or temporary pacemaker.  Discussed thoroughly with residents.

## 2011-08-29 NOTE — Progress Notes (Signed)
INITIAL ADULT NUTRITION ASSESSMENT Date: 08/29/2011   Time: 11:22 AM Reason for Assessment: Nutrition Risk: wt loss  ASSESSMENT: Female 61 y.o.  Dx: weakness   Hx:  Past Medical History  Diagnosis Date  . Depression   . Obesity   . Skin neoplasm   . Bunion   . Carotid stenosis   . Fibromyalgia   . Internal hemorrhoid   . GERD (gastroesophageal reflux disease)   . Chronic gastritis   . Transaminase or LDH elevation   . Mild cognitive impairment   . Hyperlipidemia   . Fatty liver   . Lumbar back pain   . Diabetic peripheral neuropathy   . Diverticulosis   . Dysphagia     no documented strictures but responded positively to dilation in past.   . Hypertension   . Cholecystitis     s/p cholecystectomy  . Sarcoidosis   . Fibromyalgia   . CHF (congestive heart failure)   . Skin cancer 1990's    "front of my right leg"  . Atrial fibrillation   . OSA (obstructive sleep apnea) 2003    "can't sleep w/that darm machine"  . Exertional dyspnea   . Hypothyroidism   . Type II diabetes mellitus   . TIA (transient ischemic attack)     07/29/11 pt denies this history  . H/O hiatal hernia   . Epileptic seizure, tonic     .No meds since age of 1.  . Osteoarthritis   . Stroke     tia   Past Surgical History  Procedure Date  . Hiatal hernia repair 1990    "had to have scar tissue removed 6 months after repair"  . Esophageal dilation   . Cataract extraction w/phaco 10/27/2010    Procedure: CATARACT EXTRACTION PHACO AND INTRAOCULAR LENS PLACEMENT (IOC);  Surgeon: Susa Simmonds;  Location: [facility];  Service: Ophthalmology;  Laterality: Left;  CDE- 1.78  . Cataract extraction w/phaco 07/13/2011    Procedure: CATARACT EXTRACTION PHACO AND INTRAOCULAR LENS PLACEMENT (IOC);  Surgeon: Susa Simmonds, MD;  Location: [facility];  Service: Ophthalmology;  Laterality: Right;  CDE:  1.65  . Liposuction 1992  . Inverted nipples 1992  . Breast surgery ~ 2010    excision milk duct; right  breast  . Foot surgery ~ 2011    ~straightened toe left foot & scraped bone below big toe"  . Foot surgery ~ 2011    "scraped bone of big toe; shortened middle toe; right foot"  . Cholecystectomy 1980's  . Dilation and curettage of uterus 1970; 1976  . Hernia repair 07/29/11    multiple incarcerated VHR  . Inguinal hernia repair 1990's    left  . Abdominal hysterectomy 1980's    partial  . Tubal ligation 1976  . Bilateral oophorectomy 1980's    "after partial hysterectomy"  . Skin cancer excision 1990's    "front side of right shin"  . Ventral hernia repair 07/29/2011    Procedure: LAPAROSCOPIC VENTRAL HERNIA;  Surgeon: Ernestene Mention, MD;  Location: [facility];  Service: General;  Laterality: N/A;  multiple incarcerated hernias with mesh   Related Meds:     . sodium chloride   Intravenous STAT  . amitriptyline  50 mg Oral QHS  . atropine  1 mg Intravenous Once  . B-complex with vitamin C  1 tablet Oral Daily  . calcium-vitamin D  1 tablet Oral BID WC  . FLUoxetine  20 mg Oral Daily  . insulin aspart  0-5 Units Subcutaneous QHS  . insulin aspart  0-9 Units Subcutaneous TID WC  . insulin glargine  5 Units Subcutaneous QHS  . levothyroxine  75 mcg Oral Q0600  . multivitamin with minerals  1 tablet Oral Daily  . niacin  1,000 mg Oral BID  . omega-3 acid ethyl esters  1 g Oral BID WC  . simvastatin  40 mg Oral q1800  . sodium chloride  1,000 mL Intravenous Once  . sodium chloride  1,000 mL Intravenous Once  . warfarin  2.5 mg Oral Q T,Th,S,Su-1800  . warfarin  5 mg Oral Q M,W,F-1800  . Warfarin - Physician Dosing Inpatient   Does not apply q1800  . DISCONTD: warfarin  2.5-5 mg Oral Daily   Ht: 5\' 3"  (160 cm)  Wt: 174 lbs/79 kg  Ideal Wt: 52.2kg % Ideal Wt: 151%  Usual Wt:  Wt Readings from Last 10 Encounters:  08/29/11 181 lb 3.5 oz (82.2 kg)  08/21/11 171 lb (77.565 kg)  07/30/11 193 lb 14.4 oz (87.952 kg)  07/30/11 193 lb 14.4 oz (87.952 kg)  07/23/11 188 lb 6.4 oz  (85.458 kg)  07/20/11 192 lb 14.4 oz (87.5 kg)  07/13/11 187 lb (84.823 kg)  07/13/11 187 lb (84.823 kg)  06/17/11 188 lb (85.276 kg)  06/10/11 187 lb 6.4 oz (85.004 kg)   % Usual Wt: 92%  BMI: 30.9 Obesity Class I  Food/Nutrition Related Hx: pt drowsy. Per pt she has lost 8% of her body wt starting back before she went to surgery due to a poor appetite. Per 24 hr recall pt was eating same foods but only about 1/2 of what she usually ate.  Pt meets criteria for severe malnutrition in the context of chronic illness AEB 8% wt loss in 1-2 months and intake that has been </= 75% of her needs due to a poor appetite.   Labs:  CMP     Component Value Date/Time   NA 131* 08/29/2011 0418   K 4.0 08/29/2011 0418   CL 97 08/29/2011 0418   CO2 18* 08/29/2011 0418   GLUCOSE 79 08/29/2011 0418   BUN 66* 08/29/2011 0418   CREATININE 7.32* 08/29/2011 0418   CREATININE 0.75 05/08/2011 1547   CALCIUM 8.5 08/29/2011 0418   PROT 6.8 08/29/2011 0014   ALBUMIN 3.2* 08/29/2011 0014   AST 13 08/29/2011 0014   ALT 16 08/29/2011 0014   ALKPHOS 83 08/29/2011 0014   BILITOT 0.3 08/29/2011 0014   GFRNONAA 5* 08/29/2011 0418   GFRAA 6* 08/29/2011 0418   CBG (last 3)   Basename 08/29/11 0739 08/29/11 0052  GLUCAP 88 134*   Phosphorus  Date/Time Value Range Status  08/16/2011  5:35 AM 4.1  2.3 - 4.6 mg/dL Final   Lab Results  Component Value Date   HGBA1C 6.3* 07/29/2011    Intake/Output Summary (Last 24 hours) at 08/29/11 1125 Last data filed at 08/29/11 0700  Gross per 24 hour  Intake   1000 ml  Output      0 ml  Net   1000 ml   Diet Order: Regular  Supplements/Tube Feeding: none  IVF:    sodium chloride Last Rate: 1,000 mL (08/29/11 0819)   Pt admitted for weakness, vertigo and profound anorexia. She has only been home a few days after her ventral hernia surgery. Pt in ARF on admission.  Pt does not like ensure but is willing to try snacks between meals.   Estimated Nutritional Needs:  Kcal:   1650-1800 Protein:  75-85 grams Fluid:  >1.7L/day  NUTRITION DIAGNOSIS: -Inadequate oral intake (NI-2.1).  Status: Ongoing  RELATED TO: decreased appetite  AS EVIDENCE BY: 8% wt loss PTA, meal completion of </= 50% of her meals.  MONITORING/EVALUATION(Goals): Goal: Pt to meet >/= 90% of their estimated nutrition needs Monitor: po intake, weight  EDUCATION NEEDS: -No education needs identified at this time  INTERVENTION:  Snacks TID  DOCUMENTATION CODES Per approved criteria  -Severe malnutrition in the context of chronic illness -Obesity Unspecified    Kendell Bane RD, LDN, CNSC [phone] weekend pager 08/29/2011, 11:22 AM

## 2011-08-29 NOTE — H&P (Signed)
. Hospital Admission Note Date: 08/29/2011  Patient name: [name] Medical record number: [id number] Date of birth: [birth year] Age: 61 y.o. Gender: female PCP: Melida Quitter, MD  Medical Service: Internal Medicine Teaching Service - [facility]   Attending physician: Dr. Daiva Eves    1st Contact: Dr. Bosie Clos   Pager: [phone] 2nd Contact: Dr. Dorthula Rue   Pager: [phone] After 5 pm or weekends: 1st Contact:      Pager: [phone] 2nd Contact:      Pager: [phone]  Chief Complaint: Weakness  History of Present Illness: Patient is 61 yo woman with h/o DM, CHF, HTN, Afib and hypothyroidism who presents with acute onset weakness and dizziness described as the room spinning since the afternoon of DOA.  Her son at bedside reports she has been c/o feeling light headed for the past few days, as well as experiencing a decreased appetite & dry mouth but no N/V/diarrhea/abdominal pain.  Patient thought symptoms were d/t her afib being out of control.  She also c/o left arm numbness after laying on the couch for the past few days.  She was prompted to come to the ED today because her roommate noted that she had slurred speech and decreased left handed grasp.  Finally, she c/o increased photophobia when she goes outside for the last 1-2 days.   No new medications or dosage changes made recently.  Per patient's son, patient has had a decreased appetite since hospital discharge on 08/17/11, when she was admitted for multiple incarcerated ventral hernias and underwent laparoscopic ventral hernia repair.    Meds: Name  Route  Sig   .  ALBUTEROL SULFATE HFA 108 (90 BASE) MCG/ACT IN AERS  Inhalation  Inhale 2 puffs into the lungs every 6 (six) hours as needed. For shortness of breath   .  ALPRAZOLAM 0.5 MG PO TABS  Oral  Take 1 tablet (0.5 mg total) by mouth at bedtime as needed for sleep or anxiety.   .  AMITRIPTYLINE HCL 50 MG PO TABS  Oral  Take 50 mg by mouth at bedtime.   .  ATENOLOL 25 MG PO TABS  Oral  Take  25 mg by mouth 2 (two) times daily.   .  SUPER B-50 B COMPLEX PO  Oral  Take 1 tablet by mouth 2 (two) times daily.   Marland Kitchen  CALCIUM CARBONATE-VITAMIN D 600-400 MG-UNIT PO TABS  Oral  Take 1 tablet by mouth 2 (two) times daily with a meal.   .  CYCLOBENZAPRINE HCL 5 MG PO TABS  Oral  Take 5 mg by mouth 2 (two) times daily as needed. For fibromyalgia   .  DIGOXIN 0.25 MG PO TABS  Oral  Take 250 mcg by mouth daily.   Marland Kitchen  DILTIAZEM HCL ER 180 MG PO CP24  Oral  Take 180 mg by mouth daily.   Marland Kitchen  ENOXAPARIN SODIUM 120 MG/0.8ML [place] SOLN  Subcutaneous  Inject 0.8 mLs (120 mg total) into the skin daily. Take as directed by coumadin clinic, inject daily pre op briding as directed   .  OMEGA-3 FATTY ACIDS 1000 MG PO CAPS  Oral  Take 1 g by mouth 2 (two) times daily.   Marland Kitchen  FLUOXETINE HCL 20 MG PO CAPS  Oral  Take 20 mg by mouth daily.   .  FUROSEMIDE 40 MG PO TABS  Oral  Take 40 mg by mouth daily.   Marland Kitchen  GABAPENTIN 100 MG PO CAPS  Oral  Take 100 mg by  mouth 2 (two) times daily.   Marland Kitchen  GLIPIZIDE 10 MG PO TABS  Oral  Take 10 mg by mouth 2 (two) times daily before a meal.   .  HYDROCODONE-ACETAMINOPHEN 5-500 MG PO TABS  Oral  Take 1 tablet by mouth every 12 (twelve) hours as needed. For pain   .  LEVOTHYROXINE SODIUM 75 MCG PO TABS  Oral  Take 1 tablet (75 mcg total) by mouth daily.   Marland Kitchen  LISINOPRIL 10 MG PO TABS  Oral  Take 1 tablet (10 mg total) by mouth daily.   Marland Kitchen  LOVENOX 150 MG/ML  SOLN  Subcutaneous  Inject 0.8 mLs (120 mg total) into the skin daily.      Marland Kitchen  METFORMIN HCL 1000 MG PO TABS  Oral  Take 1 tablet (1,000 mg total) by mouth 2 (two) times daily with a meal.   .  METOCLOPRAMIDE HCL 5 MG PO TABS  Oral  Take 1 tablet (5 mg total) by mouth 4 (four) times daily.   .  MULTIVITAMINS PO CAPS  Oral  Take 1 capsule by mouth daily.   Marland Kitchen  NIACIN ER (ANTIHYPERLIPIDEMIC) 500 MG PO TBCR  Oral  Take 2 tablets (1,000 mg total) by mouth 2 (two) times daily.   Marland Kitchen  OMEPRAZOLE 20 MG PO CPDR  Oral  Take 40 mg by mouth as needed.  For acid reflux   .  POTASSIUM CHLORIDE ER 10 MEQ PO TBCR  Oral  Take 10 mEq by mouth daily.   Marland Kitchen  PRAVASTATIN SODIUM 80 MG PO TABS  Oral  Take 80 mg by mouth daily.   .  WARFARIN SODIUM 5 MG PO TABS  Oral  Take 2.5-5 mg by mouth daily. Takes 5 mg on Monday Wednesday and Friday. Takes 2.5mg  on Tuesday, Thursday, Saturday, and Sunday.     Allergies: Allergies as of 08/28/2011 - Review Complete 08/21/2011  Allergen Reaction Noted  . Gemfibrozil Swelling   . Latex Other (See Comments)   . Penicillins Hives 03/11/2010  . Adhesive (tape) Other (See Comments) 05/26/2011  . Codeine Hives    Past Medical History  Diagnosis Date  . Depression   . Obesity   . Skin neoplasm   . Bunion   . Carotid stenosis   . Fibromyalgia   . Internal hemorrhoid   . GERD (gastroesophageal reflux disease)   . Chronic gastritis   . Transaminase or LDH elevation   . Mild cognitive impairment   . Hyperlipidemia   . Fatty liver   . Lumbar back pain   . Diabetic peripheral neuropathy   . Diverticulosis   . Dysphagia     no documented strictures but responded positively to dilation in past.   . Hypertension   . Cholecystitis     s/p cholecystectomy  . Sarcoidosis   . Fibromyalgia   . CHF (congestive heart failure)   . Skin cancer 1990's    "front of my right leg"  . Atrial fibrillation   . OSA (obstructive sleep apnea) 2003    "can't sleep w/that darm machine"  . Exertional dyspnea   . Hypothyroidism   . Type II diabetes mellitus   . TIA (transient ischemic attack)     07/29/11 pt denies this history  . H/O hiatal hernia   . Epileptic seizure, tonic     .No meds since age of 44.  . Osteoarthritis   . Stroke     tia   Past Surgical History  Procedure  Date  . Hiatal hernia repair 1990    "had to have scar tissue removed 6 months after repair"  . Esophageal dilation   . Cataract extraction w/phaco 10/27/2010    Procedure: CATARACT EXTRACTION PHACO AND INTRAOCULAR LENS PLACEMENT (IOC);   Surgeon: Susa Simmonds;  Location: [facility];  Service: Ophthalmology;  Laterality: Left;  CDE- 1.78  . Cataract extraction w/phaco 07/13/2011    Procedure: CATARACT EXTRACTION PHACO AND INTRAOCULAR LENS PLACEMENT (IOC);  Surgeon: Susa Simmonds, MD;  Location: [facility];  Service: Ophthalmology;  Laterality: Right;  CDE:  1.65  . Liposuction 1992  . Inverted nipples 1992  . Breast surgery ~ 2010    excision milk duct; right breast  . Foot surgery ~ 2011    ~straightened toe left foot & scraped bone below big toe"  . Foot surgery ~ 2011    "scraped bone of big toe; shortened middle toe; right foot"  . Cholecystectomy 1980's  . Dilation and curettage of uterus 1970; 1976  . Hernia repair 07/29/11    multiple incarcerated VHR  . Inguinal hernia repair 1990's    left  . Abdominal hysterectomy 1980's    partial  . Tubal ligation 1976  . Bilateral oophorectomy 1980's    "after partial hysterectomy"  . Skin cancer excision 1990's    "front side of right shin"  . Ventral hernia repair 07/29/2011    Procedure: LAPAROSCOPIC VENTRAL HERNIA;  Surgeon: Ernestene Mention, MD;  [facility];  Service: General;  Laterality: N/A;  multiple incarcerated hernias with mesh   Family History  Problem Relation Age of Onset  . Crohn's disease Mother   . Colitis Mother     Crohns  . Anesthesia problems Mother   . Cancer Father   . Diabetes Father   . Heart disease Father   . Melanoma Father   . Diabetes Sister   . Depression Maternal Uncle   . Dementia Maternal Uncle   . Hypotension Neg Hx   . Malignant hyperthermia Neg Hx   . Pseudochol deficiency Neg Hx    History   Social History  . Marital Status: Divorced    Spouse Name: N/A    Number of Children: 2  . Years of Education: 12th    Occupational History  . cosmotologist    Social History Main Topics  . Smoking status: Never Smoker   . Smokeless tobacco: Never Used  . Alcohol Use: No  . Drug Use: No  . Sexually Active: Not Currently    Other Topics Concern  . Not on file   Social History Narrative   Divorced, on disability, has 2 kids (son in [place], daughter in [place]), owns a house in [place], [place] and has a female roommate     Review of Systems: General: no fevers, chills, changes in weight Skin: no rash HEENT: no blurry vision, hearing changes, sore throat Pulm: no dyspnea, coughing, wheezing CV: no chest pain, shortness of breath Abd: no abdominal pain, nausea/vomiting, diarrhea/constipation GU: no dysuria, hematuria, polyuria, decreased urinary output Ext: no arthralgias, myalgias Neuro: as per HPI, no confusion  Physical Exam: Blood pressure 82/38, pulse 46, temperature 97.2 F (36.2 C), temperature source Oral, resp. rate 17, height 5\' 3"  (1.6 m), weight 181 lb 3.5 oz (82.2 kg), last menstrual period 06/03/1978, SpO2 100.00%. General: resting in bed, no acute distress, cooperative to exam HEENT: PERRL, EOMI, no scleral icterus, no conjunctival pallor Cardiac: bradycardic, irregular, no rubs, murmurs or gallops Pulm:  clear to auscultation bilaterally, moving normal volumes of air, no wheezing/rales/rhonchi Abd: soft, nontender, nondistended, BS normoactive, wounds from laparoscopy healing without purulence/erythema  Ext: warm and well perfused, no pedal edema Neuro: alert and oriented X3, cranial nerves II-XII grossly intact, nonfocal exam with sensation intact   Lab results: Basic Metabolic Panel:  Basename 08/29/11 0014  NA 127*  K 4.4  CL 89*  CO2 22  GLUCOSE 152*  BUN 69*  CREATININE 7.81*  CALCIUM 9.5  MG --  PHOS --   Liver Function Tests:  Basename 08/29/11 0014  AST 13  ALT 16  ALKPHOS 83  BILITOT 0.3  PROT 6.8  ALBUMIN 3.2*   CBC:  Basename 08/29/11 0014  WBC 9.5  NEUTROABS 6.0  HGB 12.2  HCT 34.9*  MCV 89.5  PLT 327   Cardiac Enzymes:  Basename 08/29/11 0015  CKTOTAL 35  CKMB 3.6  CKMBINDEX --  TROPONINI <0.30   CBG:  Basename 08/29/11 0052  GLUCAP 134*    Coagulation:  Basename 08/29/11 0014  LABPROT 25.2*  INR 2.24*    Results for [name], [name] (MRN [id number]) as of 08/29/2011 03:35  Ref. Range 08/29/2011 00:14  Digoxin Level Latest Range: 0.8-2.0 ng/mL 3.5 (HH)   Lactic Acid - 1.6   Imaging results:  Dg Chest 2 View  08/29/2011  *RADIOLOGY REPORT*  Clinical Data: Weakness and lethargy.  History of diabetes.  CHEST - 2 VIEW  Comparison: Chest radiograph performed 08/06/2011  Findings: The lungs are well-aerated and clear.  There is no evidence of focal opacification, pleural effusion or pneumothorax.  The heart is borderline normal in size; calcification is noted within the aortic arch.  No acute osseous abnormalities are seen. Clips are noted within the right upper quadrant, reflecting prior cholecystectomy.  IMPRESSION: No acute cardiopulmonary process seen.  Original Report Authenticated By: Tonia Ghent, M.D.    Other results: EKG: afib with slow ventricular response (HR 44)  Assessment & Plan by Problem: 60 yo woman with h/o Afib, DM, hypothyroidism and h/o CHF p/w  #Acute Renal Failure: In the setting of anorexia and several antihypertensive medications (including atenolol, diltiazem, lasix, lisinopril), patient likely developed pre-renal disease.  Unclear if dig toxicity contributed to hypotension worsening renal function, or if ARF caused dig toxicity.  Given hypotension on presentation, ATN is in the differential.  Patient has no c/o dysuria/urgency so infection less likely.  Though patient reports decreased urination today, prior to that she had no problems, and thus post renal obstruction less likely.  -Admit to SDU in setting of bradycardia -Aggressive IVF hydration, and hold antihypertensives/diuretics listed above -FEurea -follow UA/micro -Strict I/Os to monitor urine production -Consider renal ultrasound if no significant improvement of Cr in AM -Renal has already been consulted from the ED, appreciate their  input  #Bradycardia: Received 1 dose atropine in the ED with minimal response.  Likely d/t dig toxicity given elevated level.  Patient also taking atenolol. No prior h/o bradycardia.  Initially trop negative.  -Hold dig & betablocker  #Digoxin Toxicity : Likely precipitated by ARF since patient does not report intentional over ingestion. No role for Dig antibody as dig level and potassium both <5. -Aggressive diuresis -Hold digoxin  #Elevated AG: AG = 16 at admission, likely d/t uremia & possibly starvation ketosis in setting of anorexia.  Lactic acid wnl. No significant hyperglycemia. No history of other ingestions such as methanol or ethylene glycol. -Monitor AM BMET  #Hyponatremia: Likely hypovolemic in setting of decreased PO  intake and hypotension.  Patient does not have significant CHF, nor history of chronic liver or renal failure.  Patient is on amitriptyline & fluoxetine, which may cause SIADH, but this is less likely in this clinical picture.  Will recheck AM BMET at 5am, and if hyponatremia persists, then consider discontinuation of those medications.  #HTN: Patient hypotensive at admission. Will hold all antihypertensive medications.  #Atrial Fibrillation : Patient takes atenolol and digoxin at home.  Currently bradycardic.  Will continue coumadin for anticoagulation.    #Type 2 DM: A1c = 6.3 on 07/29/11.  Hold metformin & glipizide in setting of ARF.  -Lantus 5u qHS and SSI -CBG checks ACHS  #[place] systolic Heart Failure: in the past, patient has had an echo revealing EF of 50-55% suggesting mild systolic dysfunction, but most recent echo in 2009 reveals EF 60-65% with normal diastolic dysfunction.  Patient does not appear to be in acute exacerbation. Will monitor for SOB closely in setting of aggressive IVF hydration.  #Hypothyroidism: Last TSH = 0.756 (wnl) on 07/31/11.  Continue home dose levothyroxine.  #VTE ppx: Anticoagulation with coumadin  Signed: KAPADIA,  Joshia Kitchings 08/29/2011, 2:31 AM

## 2011-08-30 ENCOUNTER — Inpatient Hospital Stay (HOSPITAL_COMMUNITY): Payer: Medicare Other

## 2011-08-30 LAB — GLUCOSE, CAPILLARY
Glucose-Capillary: 124 mg/dL — ABNORMAL HIGH (ref 70–99)
Glucose-Capillary: 113 mg/dL — ABNORMAL HIGH (ref 70–99)
Glucose-Capillary: 127 mg/dL — ABNORMAL HIGH (ref 70–99)

## 2011-08-30 LAB — BASIC METABOLIC PANEL
BUN: 58 mg/dL — ABNORMAL HIGH (ref 6–23)
CO2: 18 mEq/L — ABNORMAL LOW (ref 19–32)
Calcium: 9.2 mg/dL (ref 8.4–10.5)
Chloride: 107 mEq/L (ref 96–112)
GFR calc Af Amer: 10 mL/min — ABNORMAL LOW (ref >90)
Glucose, Bld: 120 mg/dL — ABNORMAL HIGH (ref 70–99)
Sodium: 138 mEq/L (ref 135–145)

## 2011-08-30 LAB — PROTIME-INR
INR: 2.96 — ABNORMAL HIGH (ref 0.00–1.49)
Prothrombin Time: 31.3 seconds — ABNORMAL HIGH (ref 11.6–15.2)

## 2011-08-30 NOTE — Progress Notes (Signed)
Patient ID: [name], female   DOB: Mar 02, 1951, 61 y.o.   MRN: [id number]   [facility] Progress Note    Subjective:   Reports to be feeling better than she did yesterday- denies any CP/SOB   Objective:   BP 119/54  Pulse 67  Temp 97.9 F (36.6 C) (Oral)  Resp 21  Ht 5\' 3"  (1.6 m)  Wt 87 kg (191 lb 12.8 oz)  BMI 33.98 kg/m2  SpO2 96%  LMP 06/03/1978  Intake/Output Summary (Last 24 hours) at 08/30/11 0815 Last data filed at 08/30/11 0809  Gross per 24 hour  Intake   4250 ml  Output   2001 ml  Net   2249 ml   Weight change: -3.274 kg (-7 lb 3.5 oz)  Physical Exam: WUJ:[id number] sleeping in bed- easily aroused AOZ:HYQMV irregular and rate controlled Resp:CTA bilaterally, no rales/rhonchi HQI:ONGE, Obese, NT, BS normal Ext:No LE edema  Imaging: Dg Chest 2 View  08/29/2011  *RADIOLOGY REPORT*  Clinical Data: Weakness and lethargy.  History of diabetes.  CHEST - 2 VIEW  Comparison: Chest radiograph performed 08/06/2011  Findings: The lungs are well-aerated and clear.  There is no evidence of focal opacification, pleural effusion or pneumothorax.  The heart is borderline normal in size; calcification is noted within the aortic arch.  No acute osseous abnormalities are seen. Clips are noted within the right upper quadrant, reflecting prior cholecystectomy.  IMPRESSION: No acute cardiopulmonary process seen.  Original Report Authenticated By: Tonia Ghent, M.D.   Ct Head Wo Contrast  08/29/2011  *RADIOLOGY REPORT*  Clinical Data: Weakness and slurred speech.  Hypotension.  CT HEAD WITHOUT CONTRAST  Technique:  Contiguous axial images were obtained from the base of the skull through the vertex without contrast.  Comparison: CT of the head performed 12/18/2007  Findings: There is no evidence of acute infarction, mass lesion, or intra- or extra-axial hemorrhage on CT.  Minimal beam-hardening artifact is noted at the high left frontoparietal region.  The posterior  fossa, including the cerebellum, brainstem and fourth ventricle, is within normal limits.  The third and lateral ventricles, and basal ganglia are unremarkable in appearance.  The cerebral hemispheres are symmetric in appearance, with normal gray- white differentiation.  No mass effect or midline shift is seen.  There is no evidence of fracture; visualized osseous structures are unremarkable in appearance.  The orbits are within normal limits. The paranasal sinuses and mastoid air cells are well-aerated.  No significant soft tissue abnormalities are seen.  IMPRESSION: Unremarkable noncontrast CT of the head.  Original Report Authenticated By: Tonia Ghent, M.D.    Labs: BMET  Lab 08/29/11 1028 08/29/11 0418 08/29/11 0014  NA 130* 131* 127*  K 4.1 4.0 4.4  CL 98 97 89*  CO2 18* 18* 22  GLUCOSE 104* 79 152*  BUN 66* 66* 69*  CREATININE 6.80* 7.32* 7.81*  ALB -- -- --  CALCIUM 8.4 8.5 9.5  PHOS -- -- --   CBC  Lab 08/29/11 0014  WBC 9.5  NEUTROABS 6.0  HGB 12.2  HCT 34.9*  MCV 89.5  PLT 327    Medications:      . sodium chloride   Intravenous STAT  . Chlorhexidine Gluconate Cloth  6 each Topical Q0600  . FLUoxetine  20 mg Oral Daily  . insulin aspart  0-5 Units Subcutaneous QHS  . insulin aspart  0-9 Units Subcutaneous TID WC  . insulin glargine  5 Units Subcutaneous QHS  . levothyroxine  75 mcg Oral Q0600  . mupirocin ointment  1 application Nasal BID  . warfarin  2.5 mg Oral Q T,Th,S,Su-1800  . warfarin  5 mg Oral Q M,W,F-1800  . Warfarin - Physician Dosing Inpatient   Does not apply q1800  . DISCONTD: amitriptyline  50 mg Oral QHS  . DISCONTD: B-complex with vitamin C  1 tablet Oral Daily  . DISCONTD: calcium-vitamin D  1 tablet Oral BID WC  . DISCONTD: multivitamin with minerals  1 tablet Oral Daily  . DISCONTD: niacin  1,000 mg Oral BID  . DISCONTD: omega-3 acid ethyl esters  1 g Oral BID WC  . DISCONTD: simvastatin  40 mg Oral q1800     Assessment/ Plan:    1. Acute renal failure due to ACEI/vol depletion- Currently non-oliguric and with slight improvement in renal function- doubt evolution into ATN but will monitor trend of renal function. Continue volume resuscitation, holding ACEI and other BP meds as needed. Expect recovery as baseline renal function appears to be normal. 2. Recent VH repair - complicated by post-operative ileus, in hosp 2-3 weeks 3. Afib - Rate controlled and recently digoxin toxic 4. Digoxin toxicity - Currently on IVFs 5. Vol depletion - getting IVF"s.   Zetta Bills, MD 08/30/2011, 8:15 AM

## 2011-08-30 NOTE — Progress Notes (Signed)
Subjective: She reports feeling much better- numbness of her face is getting better. " I just had a bowel movement". Objective: Vital signs in last 24 hours: Filed Vitals:   08/29/11 2039 08/29/11 2300 08/29/11 2325 08/30/11 0421  BP: 102/53 95/50  107/40  Pulse: 57 59  60  Temp: 97.9 F (36.6 C)  97.7 F (36.5 C) 97.8 F (36.6 C)  TempSrc: Oral  Oral Oral  Resp: 19 20  16   Height:      Weight:    191 lb 12.8 oz (87 kg)  SpO2: 96% 96%  97%   Weight change: -7 lb 3.5 oz (-3.274 kg)  Intake/Output Summary (Last 24 hours) at 08/30/11 0718 Last data filed at 08/30/11 1478  Gross per 24 hour  Intake   4250 ml  Output   1826 ml  Net   2424 ml   BP 119/54  Pulse 67  Temp 97.9 F (36.6 C) (Oral)  Resp 21  Ht 5\' 3"  (1.6 m)  Wt 191 lb 12.8 oz (87 kg)  BMI 33.98 kg/m2  SpO2 96%  LMP 06/03/1978  General Appearance:    Alert, cooperative, no distress, appears stated age  Head:    Normocephalic, without obvious abnormality, atraumatic  Eyes:    PERRL, conjunctiva/corneas clear, EOM's intact, fundi    benign, both eyes  Ears:    Normal TM's and external ear canals, both ears  Nose:   Nares normal, septum midline, mucosa normal, no drainage    or sinus tenderness  Throat:   Lips, mucosa, and tongue normal; teeth and gums normal  Neck:   Supple, symmetrical, trachea midline, no adenopathy;    thyroid:  no enlargement/tenderness/nodules; no carotid   bruit or JVD  Back:     Symmetric, no curvature, ROM normal, no CVA tenderness  Lungs:     Clear to auscultation bilaterally, respirations unlabored  Chest Wall:    No tenderness or deformity   Heart:    Regular rate and rhythm, S1 and S2 normal, no murmur, rub   or gallop  Breast Exam:    No tenderness, masses, or nipple abnormality  Abdomen:     Soft, non-tender, bowel sounds active all four quadrants,    no masses, no organomegaly  Genitalia:    Normal female without lesion, discharge or tenderness  Rectal:    Normal tone, normal  prostate, no masses or tenderness;   guaiac negative stool  Extremities:   Extremities normal, atraumatic, no cyanosis or edema  Pulses:   2+ and symmetric all extremities  Skin:   Skin color, texture, turgor normal, no rashes or lesions  Lymph nodes:   Cervical, supraclavicular, and axillary nodes normal  Neurologic:   CNII-XII intact, normal strength, sensation and reflexes    throughout   Lab Results: Basic Metabolic Panel:  Lab 08/29/11 2956 08/29/11 0418  NA 130* 131*  K 4.1 4.0  CL 98 97  CO2 18* 18*  GLUCOSE 104* 79  BUN 66* 66*  CREATININE 6.80* 7.32*  CALCIUM 8.4 8.5  MG -- --  PHOS -- --   Liver Function Tests:  Lab 08/29/11 0014  AST 13  ALT 16  ALKPHOS 83  BILITOT 0.3  PROT 6.8  ALBUMIN 3.2*   No results found for this basename: LIPASE:2,AMYLASE:2 in the last 168 hours No results found for this basename: AMMONIA:2 in the last 168 hours CBC:  Lab 08/29/11 0014  WBC 9.5  NEUTROABS 6.0  HGB 12.2  HCT  34.9*  MCV 89.5  PLT 327   Cardiac Enzymes:  Lab 08/29/11 0015  CKTOTAL 35  CKMB 3.6  CKMBINDEX --  TROPONINI <0.30   CBG:  Lab 08/29/11 2129 08/29/11 1615 08/29/11 1210 08/29/11 0739 08/29/11 0052  GLUCAP 112* 107* 109* 88 134*   Coagulation:  Lab 08/30/11 0340 08/29/11 0418 08/29/11 0014 08/24/11 0940  LABPROT 31.3* 26.9* 25.2* --  INR 2.96* 2.44* 2.24* 2.8   Urinalysis:  Lab 08/29/11 0256  COLORURINE AMBER*  LABSPEC 1.020  PHURINE 5.0  GLUCOSEU NEGATIVE  HGBUR NEGATIVE  BILIRUBINUR MODERATE*  KETONESUR 15*  PROTEINUR NEGATIVE  UROBILINOGEN 0.2  NITRITE NEGATIVE  LEUKOCYTESUR TRACE*     Micro Results: Recent Results (from the past 240 hour(s))  MRSA PCR SCREENING     Status: Abnormal   Collection Time   08/29/11 10:00 AM      Component Value Range Status Comment   MRSA by PCR POSITIVE (*) NEGATIVE Final    Studies/Results: Dg Chest 2 View  08/29/2011  *RADIOLOGY REPORT*  Clinical Data: Weakness and lethargy.  History of  diabetes.  CHEST - 2 VIEW  Comparison: Chest radiograph performed 08/06/2011  Findings: The lungs are well-aerated and clear.  There is no evidence of focal opacification, pleural effusion or pneumothorax.  The heart is borderline normal in size; calcification is noted within the aortic arch.  No acute osseous abnormalities are seen. Clips are noted within the right upper quadrant, reflecting prior cholecystectomy.  IMPRESSION: No acute cardiopulmonary process seen.  Original Report Authenticated By: Tonia Ghent, M.D.   Ct Head Wo Contrast  08/29/2011  *RADIOLOGY REPORT*  Clinical Data: Weakness and slurred speech.  Hypotension.  CT HEAD WITHOUT CONTRAST  Technique:  Contiguous axial images were obtained from the base of the skull through the vertex without contrast.  Comparison: CT of the head performed 12/18/2007  Findings: There is no evidence of acute infarction, mass lesion, or intra- or extra-axial hemorrhage on CT.  Minimal beam-hardening artifact is noted at the high left frontoparietal region.  The posterior fossa, including the cerebellum, brainstem and fourth ventricle, is within normal limits.  The third and lateral ventricles, and basal ganglia are unremarkable in appearance.  The cerebral hemispheres are symmetric in appearance, with normal gray- white differentiation.  No mass effect or midline shift is seen.  There is no evidence of fracture; visualized osseous structures are unremarkable in appearance.  The orbits are within normal limits. The paranasal sinuses and mastoid air cells are well-aerated.  No significant soft tissue abnormalities are seen.  IMPRESSION: Unremarkable noncontrast CT of the head.  Original Report Authenticated By: Tonia Ghent, M.D.   Medications: I have reviewed the patient's current medications. Scheduled Meds:   . sodium chloride   Intravenous STAT  . Chlorhexidine Gluconate Cloth  6 each Topical Q0600  . FLUoxetine  20 mg Oral Daily  . insulin aspart  0-5  Units Subcutaneous QHS  . insulin aspart  0-9 Units Subcutaneous TID WC  . insulin glargine  5 Units Subcutaneous QHS  . levothyroxine  75 mcg Oral Q0600  . mupirocin ointment  1 application Nasal BID  . warfarin  2.5 mg Oral Q T,Th,S,Su-1800  . warfarin  5 mg Oral Q M,W,F-1800  . Warfarin - Physician Dosing Inpatient   Does not apply q1800  . DISCONTD: amitriptyline  50 mg Oral QHS  . DISCONTD: B-complex with vitamin C  1 tablet Oral Daily  . DISCONTD: calcium-vitamin D  1 tablet Oral  BID WC  . DISCONTD: multivitamin with minerals  1 tablet Oral Daily  . DISCONTD: niacin  1,000 mg Oral BID  . DISCONTD: omega-3 acid ethyl esters  1 g Oral BID WC  . DISCONTD: simvastatin  40 mg Oral q1800   Continuous Infusions:   . sodium chloride 1,000 mL (08/30/11 0444)   PRN Meds:.acetaminophen, acetaminophen, albuterol, ondansetron (ZOFRAN) IV, ondansetron, DISCONTD: ALPRAZolam, DISCONTD:  HYDROmorphone (DILAUDID) injection Assessment/Plan: #Acute Renal Failure:  Pre- renal in etiology in the setting of anorexia( volume depletion ) and ACE- I and lasix. Her Cr is trending down with IV fluids ( 7.8>7.3>4.95). Her Fe urea was 11 % corroborating with the pre - renal etiology .She has good urine output ~1.8 litres from yesterday but net positive balance of + 3 litres. Her weight is up to 191 lbs this AM( 10 lbs up from his admission weight ). Her weights are very inaccurate in ours system with 171 lbs on 6/14,  191 lbs on 5/23. She does not have any signs of fluid overload on exam  - no pretibial edema and lungs are clear. Appreciate renal following along and their inputs!  - Continue on 125 cc/hr.  - would continue to monitor her for SOB.  - F/U renal ultrasound.  Filed Weights   08/29/11 0237 08/29/11 1300 08/30/11 0421  Weight: 181 lb 3.5 oz (82.2 kg) 174 lb (78.926 kg) 191 lb 12.8 oz (87 kg)   #Digoxin Toxicity : Dig levels - 3.5  On 6/22. Patient presented with bradycardia with HR in 30's requiring  one dose of atropine.She was also symptomatic with weakness, dizziness and confusion that are improving.  Her bradycardia is improving with HR in 60's . - Continue to hold digoxin, BB- blocker and CC- blocker.   -  Would get a repeat EKG   #Elevated AG: AG = 16 at admission, likely d/t uremia & possibly starvation ketosis in setting of anorexia. Lactic acid wnl.  Her gap decreased to 13 yesterday with repeat BMET.    #Hyponatremia:  Likely hypovolemic in setting of decreased PO intake and hypotension. Resolved. - Continue IV fluids.   #HTN: Patient hypotensive at admission( systolic's in 80's). Improving with systolic's in 100's and diastolic's in 40- 50's Will hold all antihypertensive medications.   #Atrial Fibrillation :  Currently bradycardic. Will continue coumadin for anticoagulation. INR- 2.96 today.  #Type 2 DM: A1c = 6.3 on 07/29/11. CBG's are well controlled.  -Continue  SSI  -CBG checks ACHS   #Hypothyroidism: Last TSH = 0.756 (wnl) on 07/31/11. Continue home dose levothyroxine.  #s/p ventral hernia repair  On 5/22 complicated by post op ileus and gastroparesis    #VTE ppx: Anticoagulation with coumadin    LOS: 1 day   Lennin Osmond 08/30/2011, 7:18 AM

## 2011-08-31 DIAGNOSIS — N179 Acute kidney failure, unspecified: Principal | ICD-10-CM

## 2011-08-31 DIAGNOSIS — N289 Disorder of kidney and ureter, unspecified: Secondary | ICD-10-CM | POA: Diagnosis present

## 2011-08-31 DIAGNOSIS — R001 Bradycardia, unspecified: Secondary | ICD-10-CM | POA: Diagnosis present

## 2011-08-31 DIAGNOSIS — T460X1A Poisoning by cardiac-stimulant glycosides and drugs of similar action, accidental (unintentional), initial encounter: Secondary | ICD-10-CM | POA: Diagnosis present

## 2011-08-31 DIAGNOSIS — R630 Anorexia: Secondary | ICD-10-CM | POA: Diagnosis present

## 2011-08-31 DIAGNOSIS — I952 Hypotension due to drugs: Secondary | ICD-10-CM | POA: Diagnosis present

## 2011-08-31 LAB — URINALYSIS, ROUTINE W REFLEX MICROSCOPIC
Glucose, UA: NEGATIVE mg/dL
Ketones, ur: 15 mg/dL — AB
Nitrite: NEGATIVE
Protein, ur: NEGATIVE mg/dL
Specific Gravity, Urine: 1.02 (ref 1.005–1.030)
Urobilinogen, UA: 0.2 mg/dL (ref 0.0–1.0)

## 2011-08-31 LAB — PROTIME-INR: INR: 2.73 — ABNORMAL HIGH (ref 0.00–1.49)

## 2011-08-31 LAB — URINE MICROSCOPIC-ADD ON

## 2011-08-31 LAB — BASIC METABOLIC PANEL
BUN: 40 mg/dL — ABNORMAL HIGH (ref 6–23)
CO2: 18 mEq/L — ABNORMAL LOW (ref 19–32)
Calcium: 9.8 mg/dL (ref 8.4–10.5)
Chloride: 111 mEq/L (ref 96–112)
Creatinine, Ser: 2.5 mg/dL — ABNORMAL HIGH (ref 0.50–1.10)
GFR calc non Af Amer: 19 mL/min — ABNORMAL LOW (ref >90)
Glucose, Bld: 121 mg/dL — ABNORMAL HIGH (ref 70–99)
Potassium: 4.4 mEq/L (ref 3.5–5.1)
Sodium: 142 mEq/L (ref 135–145)

## 2011-08-31 LAB — GLUCOSE, CAPILLARY
Glucose-Capillary: 113 mg/dL — ABNORMAL HIGH (ref 70–99)
Glucose-Capillary: 119 mg/dL — ABNORMAL HIGH (ref 70–99)

## 2011-08-31 LAB — DIGOXIN LEVEL: Digoxin Level: 1.9 ng/mL (ref 0.8–2.0)

## 2011-08-31 MED ORDER — BOOST / RESOURCE BREEZE PO LIQD
1.0000 | ORAL | Status: DC
  Administered 2011-08-31 – 2011-09-03 (×4): 1 via ORAL

## 2011-08-31 NOTE — Progress Notes (Signed)
Patient [name], 61 year old white female resident of [place], [place] struggles physically.  But patient has strong faith that God "will get me through this.  Patient thanked Orthoptist for providing pastoral presence, prayer, and conversation.  For follow-up, I will refer patient to the unit Chaplain for [facility] where patient is moving to.

## 2011-08-31 NOTE — Progress Notes (Signed)
Nutrition Follow-up/Consult  Eating 10% of meals - continues to eat poorly. RD consulted for poor PO intake. Pt states that she does not like the food at the hospital. Discussed different supplement and snack options, pt agreeable to trying [employer] and [employer]. Discussed plan with RN.  Diet Order:  Regular Supplement: Snacks between meals  Meds: Scheduled Meds:   . Chlorhexidine Gluconate Cloth  6 each Topical Q0600  . FLUoxetine  20 mg Oral Daily  . insulin aspart  0-5 Units Subcutaneous QHS  . insulin aspart  0-9 Units Subcutaneous TID WC  . insulin glargine  5 Units Subcutaneous QHS  . levothyroxine  75 mcg Oral Q0600  . mupirocin ointment  1 application Nasal BID  . warfarin  2.5 mg Oral Q T,Th,S,Su-1800  . warfarin  5 mg Oral Q M,W,F-1800  . Warfarin - Physician Dosing Inpatient   Does not apply q1800   Continuous Infusions:   . DISCONTD: sodium chloride 75 mL/hr at 08/30/11 2257   PRN Meds:.acetaminophen, acetaminophen, albuterol, ondansetron (ZOFRAN) IV, ondansetron  Labs:  CMP     Component Value Date/Time   NA 142 08/31/2011 0817   K 4.4 08/31/2011 0817   CL 111 08/31/2011 0817   CO2 18* 08/31/2011 0817   GLUCOSE 121* 08/31/2011 0817   BUN 40* 08/31/2011 0817   CREATININE 2.50* 08/31/2011 0817   CREATININE 0.75 05/08/2011 1547   CALCIUM 9.8 08/31/2011 0817   PROT 6.8 08/29/2011 0014   ALBUMIN 3.2* 08/29/2011 0014   AST 13 08/29/2011 0014   ALT 16 08/29/2011 0014   ALKPHOS 83 08/29/2011 0014   BILITOT 0.3 08/29/2011 0014   GFRNONAA 19* 08/31/2011 0817   GFRAA 22* 08/31/2011 0817     Intake/Output Summary (Last 24 hours) at 08/31/11 1211 Last data filed at 08/31/11 0900  Gross per 24 hour  Intake   1860 ml  Output   2525 ml  Net   -665 ml   Ht: 5\' 3"  (160 cm)  Weight Status: 190 lb/86 kg - wt up 16 lb x 2 days  Body mass index is 33.74 kg/(m^2). Obese, class I.  Re-estimated needs:  1650 - 1800 kcal, 75 - 85 grams  Nutrition Dx:   Inadequate oral intake r/t decreased appetite AEB 8% wt loss PTA, meal completion of </=50% of meals. Ongoing.  Goal:  Pt to meet >/= 90% of their estimated nutrition needs - unmet  Intervention:   1. Pt agreeable to trying [employer] PO daily and [employer] between meals 2. Discussed possibility of bringing foods from home to encourage intake and help with food preferences 3. If intake does not improve, consider short-term enteral nutrition to help meet patient's nutrition needs given pt meets criteria for severe malnutrition 4. RD to continue to follow nutrition care plan  Monitor:  PO intake, weights, labs, I/O's  Adair Laundry Pager #:  [phone]

## 2011-08-31 NOTE — Progress Notes (Signed)
Utilization Review Completed.  [name]  08/31/2011  

## 2011-08-31 NOTE — Clinical Documentation Improvement (Signed)
MALNUTRITION DOCUMENTATION CLARIFICATION  THIS DOCUMENT IS NOT A PERMANENT PART OF THE MEDICAL RECORD  TO RESPOND TO THE THIS QUERY, FOLLOW THE INSTRUCTIONS BELOW:  1. If needed, update documentation for the patient's encounter via the notes activity.  2. Access this query again and click edit on the In [employer].  3. After updating, or not, click F2 to complete all highlighted (required) fields concerning your review. Select "additional documentation in the medical record" OR "no additional documentation provided".  4. Click Sign note button.  5. The deficiency will fall out of your In Basket *Please let us know if you are not able to complete this workflow by phone or e-mail (listed below).  Please update your documentation within the medical record to reflect your response to this query .                                                                                        08/31/11   Dear Dr. Kristie Cowman and  Associates,  In a better effort to capture your patient's severity of illness, reflect appropriate length of stay and utilization of resources, a review of the patient medical record has revealed the following indicators.    Based on your clinical judgment, please clarify and document in a progress note and/or discharge summary the clinical condition associated with the following supporting information:  In responding to this query please exercise your independent judgment.  The fact that a query is asked, does not imply that any particular answer is desired or expected.   Possible Clinical Conditions  Severe Malnutrition    Protein Calorie Malnutrition  Severe Protein Calorie Malnutrition    Other Condition  Cannot clinically determine     Supporting Information:  Risk Factors: "Son who states that pt has been depressed and refuses to eat, including Ensure supplements" per notes Recent ventral hernia repair  Signs & Symptoms:  " Per pt she has lost 8%  of her body wt starting back before she went to surgery due to a poor appetiteInadequate oral intake per RD assessment  Decreased appetite  "Pt meets criteria for severe malnutrition in the context of chronic illness AEB 8% wt loss in 1-2 months and intake that has been </= 75% of her needs due to a poor appetite" per RD assessment  "Profound anorexia" per notes  [employer] Ht    63in Wt   86.4kg BMI: 33.74  Diagnostics: Albumin level:   3.2 on 6/22 Total Protein:    6.8 on 6/22 Calcium level:   9.5 on 6/22  Treatment  Monitor po intake, weight Encourage po intake [employer] po daily  Nutrition Consult Severe malnutrition in the context of chronic illness  -Obesity Unspecified    You may use possible, probable, or suspect with inpatient documentation. possible, probable, suspected diagnoses MUST be documented at the time of discharge  Reviewed:  no additional documentation provided  Thank You,    Rossie Muskrat RN, BSN  Clinical Documentation Specialist Pager:  [phone] [email or web address]  Health Information Management [place]

## 2011-08-31 NOTE — Progress Notes (Signed)
 [facility]  Subjective:  Awake, alert, anorectic.  No nausea or vomiting   Objective: Vital signs in last 24 hours: Blood pressure 130/66, pulse 83, temperature 98.1 F (36.7 C), temperature source Oral, resp. rate 18, height 5\' 3"  (1.6 m), weight 86.4 kg (190 lb 7.6 oz), last menstrual period 06/03/1978, SpO2 98.00%.    PHYSICAL EXAM General--courteous, no pain "no appetite." Chest--clear Heart--no rub Abd--nontender--incision looks like it's all healed! Extr--no edema  Lab Results:   Lab 08/31/11 0817 08/30/11 0830 08/29/11 1028  NA 142 138 130*  K 4.4 4.7 4.1  CL 111 107 98  CO2 18* 18* 18*  BUN 40* 58* 66*  CREATININE 2.50* 4.95* 6.80*  ALB -- -- --  GLUCOSE 121* -- --  CALCIUM 9.8 9.2 8.4  PHOS -- -- --     Basename 08/29/11 0014  WBC 9.5  HGB 12.2  HCT 34.9*  PLT 327    Assessment/Plan: 1. Acute renal failure due to ACEI/vol depletion- Currently non-oliguric ([id number], O-[id number] yesterday; today 210/350).  No lab done this AM.  I've ordered lab for tomorrow. 2. Recent VH repair - complicated by post-operative ileus, in hosp 2-3 weeks 3. Afib - Rate controlled.  Dig level 1.9 today, on coumadin--PT/INR 29.4sec/2.73 today.  Had been on atenolol and digoxin PTA.  Will need advice from cardiology about restarting one or both 4. Digoxin toxicity - see above 5. Vol depletion - getting IV NS at 75/hr--ok with me to d/c IVF and see if renal fct continues to improve.    LOS: 2 days   Samer Dutton F 08/31/2011,10:56 AM   .labalb

## 2011-08-31 NOTE — Progress Notes (Addendum)
Subjective: No acute events overnight, Feeling better, still decreased appetite with full plate of food at bedside   Objective: Vital signs in last 24 hours: Filed Vitals:   08/31/11 0100 08/31/11 0258 08/31/11 0447 08/31/11 0810  BP:   131/52 130/66  Pulse:   98 83  Temp: 98.6 F (37 C)  98.3 F (36.8 C) 98.1 F (36.7 C)  TempSrc: Oral  Oral Oral  Resp:   24 18  Height:      Weight:  190 lb 7.6 oz (86.4 kg)    SpO2:   96% 98%   Weight change: 16 lb 7.6 oz (7.474 kg)  Intake/Output Summary (Last 24 hours) at 08/31/11 1206 Last data filed at 08/31/11 0900  Gross per 24 hour  Intake   1860 ml  Output   2525 ml  Net   -665 ml   BP 130/66  Pulse 83  Temp 98.1 F (36.7 C) (Oral)  Resp 18  Ht 5\' 3"  (1.6 m)  Wt 190 lb 7.6 oz (86.4 kg)  BMI 33.74 kg/m2  SpO2 98%  LMP 06/03/1978  Physical Exam: General: Initially sleeping, arouses easily, Well-developed, well-nourished, in no acute distress; Head: Normocephalic, atraumatic. Lungs: Normal respiratory effort. Clear to auscultation bilaterally from apices to bases without crackles or wheezes appreciated. Heart: irregular rhythm rate controlled, normal S1 and S2, no gallop, murmur, or rubs appreciated. Abdomen: Obese, BS normoactive. Soft, non-tender. No masses or organomegaly appreciated. Extremities: No pretibial edema, distal pulses intact Neurologic: grossly non-focal, alert and oriented x3 Psych: flat affect, depressed mood, cooperative with exam      Lab Results: Basic Metabolic Panel:  Lab 08/31/11 1191 08/30/11 0830  NA 142 138  K 4.4 4.7  CL 111 107  CO2 18* 18*  GLUCOSE 121* 120*  BUN 40* 58*  CREATININE 2.50* 4.95*  CALCIUM 9.8 9.2  MG -- --  PHOS -- --   Liver Function Tests:  Lab 08/29/11 0014  AST 13  ALT 16  ALKPHOS 83  BILITOT 0.3  PROT 6.8  ALBUMIN 3.2*   CBC:  Lab 08/29/11 0014  WBC 9.5  NEUTROABS 6.0  HGB 12.2  HCT 34.9*  MCV 89.5  PLT 327   Cardiac Enzymes:  Lab 08/29/11  0015  CKTOTAL 35  CKMB 3.6  CKMBINDEX --  TROPONINI <0.30   CBG:  Lab 08/31/11 0809 08/30/11 2143 08/30/11 1652 08/30/11 1218 08/30/11 0806 08/29/11 2129  GLUCAP 125* 127* 113* 124* 112* 112*   Coagulation:  Lab 08/31/11 0425 08/30/11 0340 08/29/11 0418 08/29/11 0014  LABPROT 29.4* 31.3* 26.9* 25.2*  INR 2.73* 2.96* 2.44* 2.24*   Urinalysis:  Lab 08/29/11 0256  COLORURINE AMBER*  LABSPEC 1.020  PHURINE 5.0  GLUCOSEU NEGATIVE  HGBUR NEGATIVE  BILIRUBINUR MODERATE*  KETONESUR 15*  PROTEINUR NEGATIVE  UROBILINOGEN 0.2  NITRITE NEGATIVE  LEUKOCYTESUR TRACE*     Micro Results: Recent Results (from the past 240 hour(s))  MRSA PCR SCREENING     Status: Abnormal   Collection Time   08/29/11 10:00 AM      Component Value Range Status Comment   MRSA by PCR POSITIVE (*) NEGATIVE Final    Studies/Results: US Renal  08/30/2011  *RADIOLOGY REPORT*  Clinical Data: Acute renal failure.  History of hypertension and diabetes.  RENAL/URINARY TRACT ULTRASOUND COMPLETE  Comparison:  CT abdomen and pelvis 08/06/2011.  Findings:  Right Kidney:  No hydronephrosis.  Well-preserved cortex.  Normal parenchymal echotexture without focal parenchymal abnormality. Prominent column of Bertin.  No shadowing calculi.  Approximately 12.9 cm in length.  Minimal perinephric edema as noted on the prior CT.  Left Kidney:  No hydronephrosis.  Well-preserved cortex.  Normal parenchymal echotexture without focal parenchymal abnormality.  No shadowing calculi.  Approximate 12.5 cm in length.  Bladder:  Decompressed by Foley catheter.  IMPRESSION: No significant abnormalities.  No evidence of hydronephrosis involving either kidney to suggest obstruction.  Minimal perinephric edema on the right, unchanged from the CT 08/06/2011.  Original Report Authenticated By: Arnell Sieving, M.D.   Medications: I have reviewed the patient's current medications. Scheduled Meds:    . Chlorhexidine Gluconate Cloth  6 each  Topical Q0600  . FLUoxetine  20 mg Oral Daily  . insulin aspart  0-5 Units Subcutaneous QHS  . insulin aspart  0-9 Units Subcutaneous TID WC  . insulin glargine  5 Units Subcutaneous QHS  . levothyroxine  75 mcg Oral Q0600  . mupirocin ointment  1 application Nasal BID  . warfarin  2.5 mg Oral Q T,Th,S,Su-1800  . warfarin  5 mg Oral Q M,W,F-1800  . Warfarin - Physician Dosing Inpatient   Does not apply q1800   Continuous Infusions:    . DISCONTD: sodium chloride 75 mL/hr at 08/30/11 2257   PRN Meds:.acetaminophen, acetaminophen, albuterol, ondansetron (ZOFRAN) IV, ondansetron  Assessment/Plan:  #1 Acute Renal Failure:  Improving likely pre- renal in etiology (FEUrea 11%) in the setting of anorexia (volume depletion), ACE- I and lasix. Her Cr is trending down with IV fluids ( 7.8-->7.3-->4.95-->2.5). Net -175 output with total 2.7 Liters urine output. Renal ultrasound w/o significant abnormalities and minimal perinephric edema on the right, unchanged from CT in May 2013. -hold fluids and encourage po intake -will get Nutrition Consult to eval and rec supplements and appetite stimulatants    Lab 08/31/11 0817 08/30/11 0830 08/29/11 1028 08/29/11 0418 08/29/11 0014  CREATININE 2.50* 4.95* 6.80* 7.32* 7.81*     Filed Weights   08/29/11 1300 08/30/11 0421 08/31/11 0258  Weight: 174 lb (78.926 kg) 191 lb 12.8 oz (87 kg) 190 lb 7.6 oz (86.4 kg)   #2 Digoxin Toxicity : Within normal limits today at 1.9. Dig levels - 3.5 (08/29/2011). Bradycardia and hypotension improved with bp 130/66 and pulse 88 bpm this morning. EKG with atrial fib. - Continue to hold digoxin, BB- blocker and CC- blocker.   - cont telemetry monitoring  #3 Elevated AG: within normals today (13), likely secondary to uremia & possibly starvation ketosis in setting of anorexia. Lactic acid wnl.  -cont monitor BMET  #4 Depression: on Prozac 20 mg qd which is starting dose, spoke with son who states that pt has been  depressed and refuses to eat, including Ensure supplements - consult Psych for further evaluation of Depression med recs - consider Remeron for mood and appetite stimulation  #5 Hyponatremia:  Resolved, Likely hypovolemic in setting of decreased PO intake and hypotension.   Lab 08/31/11 0817 08/30/11 0830 08/29/11 1028 08/29/11 0418 08/29/11 0014  NA 142 138 130* 131* 127*    #6 Hypotension: Resolved -cont to hold all antihypertensive medications, in setting of AKI  #7 Atrial Fibrillation :  Currently rate controlled, INR therapeutic today 2.73 - continue coumadin for anticoagulation  #8 Type 2 DM: A1c = 6.3 on 07/29/11. CBG's are well controlled.  - Continue  SSI  - CBG checks ACHS   CBG (last 3)   Basename 08/31/11 0809 08/30/11 2143 08/30/11 1652  GLUCAP 125* 127* 113*   #9  Hypothyroidism: Last TSH = 0.756 (wnl) on 07/31/11.  -Continue home dose levothyroxine.  #10 s/p ventral hernia repair (5/22): complicated by post op ileus and gastroparesis    #11 VTE ppx: Anticoagulation with coumadin    LOS: 2 days   Kristie Cowman 08/31/2011, 12:06 PM  Internal Medicine Teaching Service Attending Note Date: 08/31/2011  Patient name: [name]  Medical record number: [id number]  Date of birth: 1950-04-03    This patient has been seen and discussed with the house staff. Please see their note for complete details. I concur with their findings with the following additions/corrections: P atient doing better with improved serum creatinine even further overnight.  Acey Lav 08/31/2011, 12:23 PM

## 2011-09-01 ENCOUNTER — Encounter: Payer: Medicare Other | Admitting: Internal Medicine

## 2011-09-01 DIAGNOSIS — F332 Major depressive disorder, recurrent severe without psychotic features: Secondary | ICD-10-CM

## 2011-09-01 DIAGNOSIS — R079 Chest pain, unspecified: Secondary | ICD-10-CM

## 2011-09-01 DIAGNOSIS — R072 Precordial pain: Secondary | ICD-10-CM

## 2011-09-01 LAB — GLUCOSE, CAPILLARY
Glucose-Capillary: 117 mg/dL — ABNORMAL HIGH (ref 70–99)
Glucose-Capillary: 116 mg/dL — ABNORMAL HIGH (ref 70–99)
Glucose-Capillary: 140 mg/dL — ABNORMAL HIGH (ref 70–99)

## 2011-09-01 LAB — CARDIAC PANEL(CRET KIN+CKTOT+MB+TROPI)
CK, MB: 3.5 ng/mL (ref 0.3–4.0)
CK, MB: 3.9 ng/mL (ref 0.3–4.0)
Relative Index: INVALID (ref 0.0–2.5)
Relative Index: INVALID (ref 0.0–2.5)
Relative Index: INVALID (ref 0.0–2.5)
Total CK: 45 U/L (ref 7–177)
Troponin I: 0.72 ng/mL (ref <0.30)
Troponin I: 0.69 ng/mL (ref <0.30)
Troponin I: 0.56 ng/mL (ref <0.30)

## 2011-09-01 LAB — RENAL FUNCTION PANEL
CO2: 21 mEq/L (ref 19–32)
Calcium: 10.1 mg/dL (ref 8.4–10.5)
Chloride: 109 mEq/L (ref 96–112)
Creatinine, Ser: 1.61 mg/dL — ABNORMAL HIGH (ref 0.50–1.10)
GFR calc Af Amer: 38 mL/min — ABNORMAL LOW (ref >90)
GFR calc non Af Amer: 33 mL/min — ABNORMAL LOW (ref >90)
Glucose, Bld: 129 mg/dL — ABNORMAL HIGH (ref 70–99)
Phosphorus: 2.3 mg/dL (ref 2.3–4.6)
Sodium: 140 mEq/L (ref 135–145)

## 2011-09-01 LAB — CBC
HCT: 32.5 % — ABNORMAL LOW (ref 36.0–46.0)
Hemoglobin: 11.1 g/dL — ABNORMAL LOW (ref 12.0–15.0)
MCH: 31.6 pg (ref 26.0–34.0)
MCV: 92.6 fL (ref 78.0–100.0)
Platelets: 235 10*3/uL (ref 150–400)
RBC: 3.51 MIL/uL — ABNORMAL LOW (ref 3.87–5.11)
RDW: 13.2 % (ref 11.5–15.5)
WBC: 9.9 10*3/uL (ref 4.0–10.5)

## 2011-09-01 LAB — IRON AND TIBC: UIBC: 234 ug/dL (ref 125–400)

## 2011-09-01 LAB — PROTIME-INR: Prothrombin Time: 36.8 seconds — ABNORMAL HIGH (ref 11.6–15.2)

## 2011-09-01 MED ORDER — SENNOSIDES-DOCUSATE SODIUM 8.6-50 MG PO TABS
1.0000 | ORAL_TABLET | Freq: Every day | ORAL | Status: DC
  Administered 2011-09-01 – 2011-09-03 (×3): 1 via ORAL
  Filled 2011-09-01 (×4): qty 1

## 2011-09-01 MED ORDER — WARFARIN - PHARMACIST DOSING INPATIENT: Freq: Every day | Status: DC

## 2011-09-01 MED ORDER — FLUOXETINE HCL 20 MG PO CAPS
40.0000 mg | ORAL_CAPSULE | Freq: Every day | ORAL | Status: DC
  Administered 2011-09-02 – 2011-09-04 (×3): 40 mg via ORAL
  Filled 2011-09-01 (×3): qty 2

## 2011-09-01 MED ORDER — CYPROHEPTADINE HCL 4 MG PO TABS
2.0000 mg | ORAL_TABLET | Freq: Four times a day (QID) | ORAL | Status: DC
  Administered 2011-09-01 – 2011-09-04 (×11): 2 mg via ORAL
  Filled 2011-09-01 (×18): qty 1

## 2011-09-01 MED ORDER — MORPHINE SULFATE 2 MG/ML IJ SOLN
2.0000 mg | Freq: Once | INTRAMUSCULAR | Status: AC
  Administered 2011-09-01: 2 mg via INTRAVENOUS

## 2011-09-01 MED ORDER — ATENOLOL 25 MG PO TABS
25.0000 mg | ORAL_TABLET | Freq: Two times a day (BID) | ORAL | Status: DC
  Administered 2011-09-01 – 2011-09-04 (×7): 25 mg via ORAL
  Filled 2011-09-01 (×8): qty 1

## 2011-09-01 MED ORDER — MIRTAZAPINE 15 MG PO TABS
15.0000 mg | ORAL_TABLET | Freq: Every day | ORAL | Status: DC
  Administered 2011-09-01 – 2011-09-03 (×3): 15 mg via ORAL
  Filled 2011-09-01 (×4): qty 1

## 2011-09-01 MED ORDER — DILTIAZEM HCL ER 180 MG PO CP24
180.0000 mg | ORAL_CAPSULE | Freq: Every day | ORAL | Status: DC
  Administered 2011-09-01 – 2011-09-04 (×4): 180 mg via ORAL
  Filled 2011-09-01 (×4): qty 1

## 2011-09-01 MED ORDER — NITROGLYCERIN 0.4 MG SL SUBL
0.4000 mg | SUBLINGUAL_TABLET | SUBLINGUAL | Status: AC
  Administered 2011-09-01: 0.4 mg via SUBLINGUAL
  Filled 2011-09-01: qty 25

## 2011-09-01 MED ORDER — SODIUM CHLORIDE 0.9 % IV SOLN
INTRAVENOUS | Status: DC
Start: 2011-09-01 — End: 2011-09-03
  Administered 2011-09-01: 1000 mL via INTRAVENOUS
  Administered 2011-09-02 (×2): via INTRAVENOUS

## 2011-09-01 MED ORDER — MORPHINE SULFATE 2 MG/ML IJ SOLN
INTRAMUSCULAR | Status: AC
  Filled 2011-09-01: qty 1

## 2011-09-01 NOTE — Progress Notes (Addendum)
Subjective: Squeezing chest pain radiating from epigastrium upwards to bilateral anterior chest this morning, resolved with 1 SL NTG and 2 mg IV morphine,  EKG with Afib w/ RVR 105 bpm, TWI in AVL, V2-V6 c/w prior tracings, pt states never experienced this pain before   Objective: Vital signs in last 24 hours: Filed Vitals:   08/31/11 2049 09/01/11 0450 09/01/11 0500 09/01/11 0528  BP: 150/67 142/74 153/79 164/82  Pulse: 94 109 115 108  Temp: 98.9 F (37.2 C)  99 F (37.2 C) 99.2 F (37.3 C)  TempSrc: Oral  Oral Oral  Resp: 18  24 20   Height:      Weight: 190 lb 7.6 oz (86.4 kg)     SpO2: 97%  100% 100%   Weight change: 0 lb (0 kg)  Intake/Output Summary (Last 24 hours) at 09/01/11 7564 Last data filed at 09/01/11 0300  Gross per 24 hour  Intake    495 ml  Output   1300 ml  Net   -805 ml   BP 164/82  Pulse 108  Temp 99.2 F (37.3 C) (Oral)  Resp 20  Ht 5\' 3"  (1.6 m)  Wt 190 lb 7.6 oz (86.4 kg)  BMI 33.74 kg/m2  SpO2 100%  LMP 06/03/1978  Physical Exam: General: Initially sleeping, arouses easily, Well-developed, well-nourished, in no acute distress; Head: Normocephalic, atraumatic. Lungs: Normal respiratory effort. Clear to auscultation bilaterally from apices to bases without crackles or wheezes appreciated. Heart: irregular rhythm rate controlled, normal S1 and S2, no gallop, murmur, or rubs appreciated. Abdomen: Obese, BS normoactive. Soft, non-tender. No masses or organomegaly appreciated. Extremities: No pretibial edema, distal pulses intact Neurologic: grossly non-focal, alert and oriented x3 Psych: more conversant this morning, cooperative with exam     Lab Results: Basic Metabolic Panel:  Lab 09/01/11 3329 08/31/11 0817  NA 140 142  K 4.2 4.4  CL 109 111  CO2 21 18*  GLUCOSE 129* 121*  BUN 27* 40*  CREATININE 1.61* 2.50*  CALCIUM 10.1 9.8  MG -- --  PHOS 2.3 --   Liver Function Tests:  Lab 09/01/11 0525 08/29/11 0014  AST -- 13  ALT -- 16    ALKPHOS -- 83  BILITOT -- 0.3  PROT -- 6.8  ALBUMIN 2.8* 3.2*   CBC:  Lab 09/01/11 0525 08/29/11 0014  WBC 9.9 9.5  NEUTROABS -- 6.0  HGB 11.1* 12.2  HCT 32.5* 34.9*  MCV 92.6 89.5  PLT 235 327   Cardiac Enzymes:  Lab 09/01/11 0525 08/29/11 0015  CKTOTAL 44 35  CKMB 3.5 3.6  CKMBINDEX -- --  TROPONINI 0.72* <0.30   CBG:  Lab 08/31/11 2135 08/31/11 1729 08/31/11 1249 08/31/11 0809 08/30/11 2143 08/30/11 1652  GLUCAP 137* 119* 113* 125* 127* 113*   Coagulation:  Lab 09/01/11 0525 08/31/11 0425 08/30/11 0340 08/29/11 0418  LABPROT 36.8* 29.4* 31.3* 26.9*  INR 3.64* 2.73* 2.96* 2.44*   Urinalysis:  Lab 08/31/11 1424 08/29/11 0256  COLORURINE AMBER* AMBER*  LABSPEC 1.020 1.020  PHURINE 5.0 5.0  GLUCOSEU NEGATIVE NEGATIVE  HGBUR NEGATIVE NEGATIVE  BILIRUBINUR MODERATE* MODERATE*  KETONESUR 15* 15*  PROTEINUR NEGATIVE NEGATIVE  UROBILINOGEN 0.2 0.2  NITRITE NEGATIVE NEGATIVE  LEUKOCYTESUR SMALL* TRACE*     Micro Results: Recent Results (from the past 240 hour(s))  MRSA PCR SCREENING     Status: Abnormal   Collection Time   08/29/11 10:00 AM      Component Value Range Status Comment   MRSA by PCR  POSITIVE (*) NEGATIVE Final    Studies/Results: US Renal  08/30/2011  *RADIOLOGY REPORT*  Clinical Data: Acute renal failure.  History of hypertension and diabetes.  RENAL/URINARY TRACT ULTRASOUND COMPLETE  Comparison:  CT abdomen and pelvis 08/06/2011.  Findings:  Right Kidney:  No hydronephrosis.  Well-preserved cortex.  Normal parenchymal echotexture without focal parenchymal abnormality. Prominent column of Bertin.  No shadowing calculi.  Approximately 12.9 cm in length.  Minimal perinephric edema as noted on the prior CT.  Left Kidney:  No hydronephrosis.  Well-preserved cortex.  Normal parenchymal echotexture without focal parenchymal abnormality.  No shadowing calculi.  Approximate 12.5 cm in length.  Bladder:  Decompressed by Foley catheter.  IMPRESSION: No  significant abnormalities.  No evidence of hydronephrosis involving either kidney to suggest obstruction.  Minimal perinephric edema on the right, unchanged from the CT 08/06/2011.  Original Report Authenticated By: Arnell Sieving, M.D.   Medications: I have reviewed the patient's current medications. Scheduled Meds:    . Chlorhexidine Gluconate Cloth  6 each Topical Q0600  . feeding supplement  1 Container Oral Q24H  . FLUoxetine  20 mg Oral Daily  . insulin aspart  0-5 Units Subcutaneous QHS  . insulin aspart  0-9 Units Subcutaneous TID WC  . insulin glargine  5 Units Subcutaneous QHS  . levothyroxine  75 mcg Oral Q0600  .  morphine injection  2 mg Intravenous Once  . morphine      . mupirocin ointment  1 application Nasal BID  . nitroGLYCERIN  0.4 mg Sublingual NOW  . warfarin  2.5 mg Oral Q T,Th,S,Su-1800  . warfarin  5 mg Oral Q M,W,F-1800  . Warfarin - Physician Dosing Inpatient   Does not apply q1800   Continuous Infusions:    . DISCONTD: sodium chloride 75 mL/hr at 08/30/11 2257   PRN Meds:.acetaminophen, acetaminophen, albuterol, ondansetron (ZOFRAN) IV, ondansetron  Assessment/Plan: HD #3 for 61 yo female w/ Afib, diabetes mellitus, hypertension and Depression admitted with digoxin toxicity secondary to acute renal insufficiency from inadequate oral intake  #1 Chest pain: in setting of elevated troponin and Afib with RVR -trend cardiac enzymes -serial EKGs -Cardiology consult -resume Atenolol -consider restarting Diltiazem -cont hold ACE-I given AKI  #2 Acute Renal Failure:  Improving likely pre- renal in etiology (FEUrea 11%) in the setting of anorexia (volume depletion), ACE- I and lasix. Her Cr is trending down with IV fluids ( 7.8-->7.3-->4.95-->2.5-->1.61). Net -655 output with total 1.3 Liters urine output. Renal ultrasound w/o significant abnormalities and minimal perinephric edema on the right, unchanged from CT in May 2013. -cont to monitor -appreciate  Renal recommendations  Lab 09/01/11 0525 08/31/11 0817 08/30/11 0830 08/29/11 1028 08/29/11 0418  CREATININE 1.61* 2.50* 4.95* 6.80* 7.32*     Filed Weights   08/30/11 0421 08/31/11 0258 08/31/11 2049  Weight: 191 lb 12.8 oz (87 kg) 190 lb 7.6 oz (86.4 kg) 190 lb 7.6 oz (86.4 kg)    #3 Depression: on Prozac 20 mg qd ("for a long time"), last seen at [facility]? ~ 6 months ago per pt report, spoke with son and daughter who states that pt has been depressed and refuses to eat, including Ensure supplements (shakes and puddings) - Psych consult pending for further evaluation of Depression med recs - start 15 mg qhs Remeron for mood and appetite stimulation  #4 Inadequate oral intake: Nutrition consulted, eating ~10% of her meals, low albumin -Registered Dietician consult -Research scientist (physical sciences) -appreciate Nutrition Consult  recs -will defer consideration of short-term enteral nutrition until psych assessment  for depression  Hepatic Function Panel     Component Value Date/Time   PROT 6.8 08/29/2011 0014   ALBUMIN 2.8* 09/01/2011 0525   AST 13 08/29/2011 0014   ALT 16 08/29/2011 0014   ALKPHOS 83 08/29/2011 0014   BILITOT 0.3 08/29/2011 0014   BILIDIR 0.2 08/30/2008 0942   IBILI 0.5 08/30/2008 0942    #5 Hypertension: Bp 164/82 this morning -resume home regimen of atenolol 25 mg big -cont hold lisinopril and diltiazem pending Cardiology evaluation and recommendations  #6 Atrial Fibrillation :  Currently rate controlled, INR supra-therapeutic today 3.64 - continue coumadin for anticoagulation -resume atenolol 25 mg bid -consider restarting diltiazem as renal function is trending towards normal  #7 Type 2 Diabetic Mellitus: A1c = 6.3 on 07/29/11. CBG's are well controlled.  - Continue  SSI  - CBG checks ACHS   CBG (last 3)   Basename 08/31/11 2135 08/31/11 1729 08/31/11 1249  GLUCAP 137* 119* 113*   #8 Hypotension: Resolved -cont to hold  all antihypertensive medications, in setting of AKI  #9 Digoxin Toxicity : Resolved - cont telemetry monitoring  #10 Elevated AG: resolved   #11 Hyponatremia:  Resolved  Lab 09/01/11 0525 08/31/11 0817 08/30/11 0830 08/29/11 1028 08/29/11 0418  NA 140 142 138 130* 131*    #12 Hypothyroidism: Last TSH = 0.756 (wnl) on 07/31/11.  -Continue home dose levothyroxine.  #13 s/p ventral hernia repair (5/22): complicated by post op ileus and gastroparesis    #14 VTE ppx: Anticoagulation with coumadin    LOS: 3 days   Kristie Cowman 09/01/2011, 8:08 AM  Internal Medicine Teaching Service Attending Note Date: 09/01/2011  Patient name: [name]  Medical record number: [id number]  Date of birth: 23-Apr-1950    This patient has been seen and discussed with the house staff. Please see their note for complete details. I concur with their findings with the following additions/corrections: Patient with epigastric and chest pain overnight in context of AF with RVR with minimally elevated troponin. CP resolved. She is being seen by Cardiology who will consider cardiac cath IF pts pain returns or troponins elevate and renal fxn is improved.  With re to her atrial fibrillation we can always add in shorter acting cardizem every 6 hours if there is concern for causing bradycardia with reintroduction of more nodal blocking agents.  Paulette Blanch Dam 09/01/2011, 11:14 AM

## 2011-09-01 NOTE — Progress Notes (Signed)
CRITICAL VALUE ALERT  Critical value received:troponin 0.72 Date of notification:  6/25  Time of notification:  0705  Critical value read back:yes  Nurse who received alert:  Mervin Hack rn  MD notified (1st page):  schooler  Time of first page: 0710  MD notified (2nd page):  Time of second page:  Responding MD:  schooler  Time MD responded:  [phone]

## 2011-09-01 NOTE — Progress Notes (Signed)
Internal Medicine Night Float Progress Note.  Called by night RN ~5am because patient with active, squeezing CP, 5/10.  Subjective: Patient's c/o squeezing, mid chest pain, initially described as radiating down into her abdomen, and then described as radiating up through chest. Pain persists at 5/10, feeling diaphoretic.  Patient reports not sleeping well and was laying well as pain started.  Objective: Vitals at onset of CP: BP: 153/79, HR: 115 (afib on tele), O2 100% 3L, RR: 24 Cardio: irregular, irregular, no M/R/G, chest mildly tender to palpation  Pulm: CTA b/l Abd: soft, NT/ND, normoactive BS  Stat EKG: Afib with RVR (105), TWI in aVL, V2-V6, similar to prior EKG  Assessment/Plan: -Patient treated with 1 SL NTG and 2mg  IV morphine (she reported that codeine makes her itch).  Chest pain resolved within minutes.  Vitals after chest pain resolution:  Filed Vitals:   09/01/11 0528  BP: 164/82  Pulse: 108  Temp: 99.2 F (37.3 C)  Resp: 20   -Cardiac panel now, and then q8h x 3 given symptoms, response to NTG, and h/o CHF & Afib -RN notes that patient has been anxious all day, so if CE (-) x 3, then this may have been d/t panic attack

## 2011-09-01 NOTE — Consult Note (Signed)
Clinical Social Work Department CLINICAL SOCIAL WORK PSYCHIATRY SERVICE LINE ASSESSMENT 09/01/2011  Patient:  [name]  Account:  [id number]  Admit Date:  08/28/2011  Clinical Social Worker:  Ashley Jacobs, LCSW  Date/Time:  09/01/2011 10:40 AM Referred by:  Physician  Date referred:  09/01/2011 Reason for Referral  Behavioral Health Issues   Presenting Symptoms/Problems (In the person's/family's own words):   Patient reports she has not felt good since a surgery on 5/22. She is very tearful and reports depressed because she cannot do the things she use to do. She reports she has no appetite because when she does eat she becomes very sick.    She reports she is not giving up and not suicidal at this time. She reports she wants to get better but she feels so bad and is in chronic pain everyday.    Consult called for Depression and appeitie stimulation   Abuse/Neglect/Trauma History (check all that apply)  Denies history   Abuse/Neglect/Trauma Comments:   none reported   Psychiatric History (check all that apply)  Outpatient treatment   Psychiatric medications:  Prozac for a long time, managed by PCP   Current Mental Health Hospitalizations/Previous Mental Health History:   none reported   Current provider:   PCP  Reports she has been to [facility] in [facility] for outpatient medication managment and counseling, but since Psychiatrist either resigned or retired she has not return.   Place and Date:   Last see at [facility] is estimated around 6-9 months   Current Medications:   Scheduled Meds:   . atenolol  25 mg Oral BID  . Chlorhexidine Gluconate Cloth  6 each Topical Q0600  . diltiazem  180 mg Oral Daily  . feeding supplement  1 Container Oral Q24H  . FLUoxetine  20 mg Oral Daily  . insulin aspart  0-5 Units Subcutaneous QHS  . insulin aspart  0-9 Units Subcutaneous TID WC  . insulin glargine  5 Units Subcutaneous QHS  . levothyroxine  75 mcg Oral Q0600  .  mirtazapine  15 mg Oral QHS  .  morphine injection  2 mg Intravenous Once  . morphine      . mupirocin ointment  1 application Nasal BID  . nitroGLYCERIN  0.4 mg Sublingual NOW  . warfarin  2.5 mg Oral Q T,Th,S,Su-1800  . warfarin  5 mg Oral Q M,W,F-1800  . Warfarin - Physician Dosing Inpatient   Does not apply q1800   Continuous Infusions:   . DISCONTD: sodium chloride 75 mL/hr at 08/30/11 2257   PRN Meds:.acetaminophen, acetaminophen, albuterol, ondansetron (ZOFRAN) IV, ondansetron  Previous Impatient Admission/Date/Reason:   none reported   Emotional Health / Current Symptoms    Suicide/Self Harm  None reported   Suicide attempt in the past:   none reported   Other harmful behavior:   none reported   Psychotic/Dissociative Symptoms  None reported   Other Psychotic/Dissociative Symptoms:   Patient has been living alone and reports struggling physically to manage herself.  Recommend PT/OT for review of needs    Attention/Behavioral Symptoms  Restless   Other Attention / Behavioral Symptoms:   Patient lying in bed very disheveled but reports she is able to talk and wants to complete assessment. She cries and is very tearful throughout the entire assessment reporting depression and feeling bad all day, everyday.  Her eye contact if very poor, she keeps her eyes closed majority of the interview.  She reports he mood is depressed  and she cries a lot, because she feels so bad.  She reports she wants to eat food and get better, but she cannot hold anything down nor see any improvement each day.  Her remote and recent memory are intact. She is very dramatic and tearful in bed and gets louder as the assessment continues, to the point where CSW had to take breaks to calm down patient.  She is easily distracted and tangental, but can be redirected    Cognitive Impairment  Within Normal Limits   Other Cognitive Impairment:   Patient talkative when she was not tearful.    Alert and oriented x4 and able to answer questions effectively and cooperatively.    Mood and Adjustment  DEPRESSION  Flat    Stress, Anxiety, Trauma, Any Recent Loss/Stressor  Anxiety   Anxiety (frequency):   Anxiety seems to be a component in her healing process and she reports she gets anxious at home because she feels so bad and is in so much pain.   Phobia (specify):   Compulsive behavior (specify):   Obsessive behavior (specify):   Other:   Stress from her surgery and poor improvement in physical condition are driving factors at this time.  Limited support.   Substance Abuse/Use  None   SBIRT completed (please refer for detailed history):  N  Self-reported substance use:   none reported   Urinary Drug Screen Completed:  N Alcohol level:   none reported    Environmental/Housing/Living Arrangement  Stable housing   Who is in the home:   patient reports he typically lives alone.  She is divorced twice from the same man and he is not in her life.  She reports she has had some friends or a friend stay with her due to their homeless issue and need for assistance.  She has also stayed with son in his home after she had surgery 5/22   Emergency contact:  Son: Thayer Ohm.    Has a daughter as well but she lives in [place]   Financial  Medicare   Patient's Strengths and Goals (patient's own words):   Patient unable to verbalize at this time.  She reports she thinks of her grandchildren often and enjoys interacting and playing  with them   Clinical Social Worker's Interpretive Summary:   1. Depression/Adjustment to illness. Patient verbalizes and is very tearful at times. She will begin to cry very loudly, but is easily redirected and able to stop and continue will assessment.  Is open to looking at other medications for mood and depression. Will discuss with Psych MD.  2. Depressed Appetite. Patient was asked if she was having feelings of giving up or not wanting  to live any longer due to physical problems but she declines SI and intent. She reports she does not eat because it makes her so sick and feel even worse. She reports she wants to eat, but cannot hold anything down.  3. Chronic Pain: patient reports she is in pain all the time and every day. She reports it is miserable with is why she is so depressed.  4. She was a current patient at [facility] in [facility] and agreeable to resume care at time of dc for medication management and counseling.  5. Would recommend a PT/OT consult, with idea that patient may need SNF for short term to increase phsyical activity and overall physical problems/address physical problems.  6. Have contacted son to get more information regarding patient's baseline and support  at home and in the community, will follow up.    Will discuss case with Psych MD and continue to support patient.  Will await further recommendations   Disposition:  Recommend Psych CSW continuing to support while in hospital Ashley Jacobs, MSW LCSW [phone]

## 2011-09-01 NOTE — Progress Notes (Signed)
ANTICOAGULATION CONSULT NOTE - Follow Up Consult  Pharmacy Consult for Coumadin Indication: atrial fibrillation  Allergies  Allergen Reactions  . Gemfibrozil Swelling    REACTION: Angioedema  . Latex Other (See Comments)    Blisters where touched or applied  . Penicillins Hives    Will spread in patches all over the body.  . Adhesive (Tape) Other (See Comments)    Will blister skin where applied - do not use BAND-AIDS.  Marland Kitchen Codeine Hives    Will spread in patches all over the body.    Patient Measurements: Height: 5\' 3"  (160 cm) Weight: 190 lb 7.6 oz (86.4 kg) IBW/kg (Calculated) : 52.4  Heparin Dosing Weight:   Vital Signs: Temp: 98.8 F (37.1 C) (06/25 1400) Temp src: Oral (06/25 1400) BP: 120/53 mmHg (06/25 1400) Pulse Rate: 102  (06/25 1400)  Labs:  Basename 09/01/11 1304 09/01/11 0525 08/31/11 0817 08/31/11 0425 08/30/11 0830 08/30/11 0340  HGB -- 11.1* -- -- -- --  HCT -- 32.5* -- -- -- --  PLT -- 235 -- -- -- --  APTT -- -- -- -- -- --  LABPROT -- 36.8* -- 29.4* -- 31.3*  INR -- 3.64* -- 2.73* -- 2.96*  HEPARINUNFRC -- -- -- -- -- --  CREATININE -- 1.61* 2.50* -- 4.95* --  CKTOTAL 45 44 -- -- -- --  CKMB 3.9 3.5 -- -- -- --  TROPONINI 0.69* 0.72* -- -- -- --    Estimated Creatinine Clearance: 37.3 ml/min (by C-G formula based on Cr of 1.61).  Assessment: 61yof on chronic Coumadin for Afib. Pharmacy now consulted to manage. INR (3.64) is supratherapeutic despite patient remaining on home regimen (2.5mg  daily except 5mg  MWF).  - H/H and Plts trending down - No significant bleeding reported  Goal of Therapy:  INR 2-3   Plan:  1. No Coumadin tonight - discontinue standing Coumadin orders 2. Daily PT/INR  Cleon Dew [phone] 09/01/2011,3:06 PM

## 2011-09-01 NOTE — Progress Notes (Signed)
[facility]  Subjective:  Awake, says chest pain is gone   Objective: Vital signs in last 24 hours: Blood pressure 120/53, pulse 102, temperature 98.8 F (37.1 C), temperature source Oral, resp. rate 20, height 5\' 3"  (1.6 m), weight 86.4 kg (190 lb 7.6 oz), last menstrual period 06/03/1978, SpO2 98.00%.    PHYSICAL EXAM General--awake, alert, lying in bed Chest--clear Heart--no rub Abd--incision clean Extr--no edema  Lab Results:   Lab 09/01/11 0525 08/31/11 0817 08/30/11 0830  NA 140 142 138  K 4.2 4.4 4.7  CL 109 111 107  CO2 21 18* 18*  BUN 27* 40* 58*  CREATININE 1.61* 2.50* 4.95*  ALB -- -- --  GLUCOSE 129* -- --  CALCIUM 10.1 9.8 9.2  PHOS 2.3 -- --     Basename 09/01/11 0525  WBC 9.9  HGB 11.1*  HCT 32.5*  PLT 235     Assessment/Plan:   1.  Acute renal failure due to ACEI/vol depletion- Currently non-oliguric ([id number]-, O-1300 yesterday). lab this AM show improving renal fct.  I will sign off.  Please call if further renal help needed  2.  Recent VH repair - complicated by post-operative ileus, in hosp 2-3 weeks  3.  Afib - Rate controlled. Dig level 1.9 today, on coumadin--PT/INR 29.4sec/2.73 today. On atenolol and diltiazem currently  4.   Digoxin toxicity - see above  5.  Vol depletion - off IV fluids.    LOS: 3 days   [name] F 09/01/2011,2:18 PM   .labalb

## 2011-09-01 NOTE — Progress Notes (Signed)
Pt not able to eat or take Breeze due to nausia. No food intake today. Sipping Sprite Zero

## 2011-09-01 NOTE — Progress Notes (Signed)
Patient called out stating that she was experiencing chest pain 5/10 and that her chest was tightening. I raised the HOB. Fixed her oxygen tubing since it was not placed in her nose correctly. Told patient to take deep breaths thru nose. NT took VS. MD was paged. MD called back stating to get a STAT EKG and that she would be up shortly to check on patient. MD arrived and ordered Nitro and Morphine. STAT blood work was also done on the patient. Once both morphine (0525) and nitro (7829) were given patient stated that she felt much better and that pain was 1/10 but slightly dizzy. VS were taken again. Will continue to monitor the patient and notify MD of any more changes.

## 2011-09-01 NOTE — Consult Note (Signed)
Cardiology Consult Note   Patient ID: [name] MRN: [id number], DOB/AGE: 11/14/1950   Admit date: 08/29/2011 Date of Consult: 09/01/2011  Primary Physician: Kimberly Quitter, MD Primary Cardiologist: Kimberly Rotunda, MD  Pt. Profile: [name] is a 61yo female with PMHx significant for permanent atrial fibrillation, history of pulmonary sarcoidosis, carotid artery disease, type 2 DM, HTN, HL, OSA and hypothyroidism who was admitted to [facility] on 08/29/11 for acute renal failure.   Reason for consult: evaluation/management of chest pain  Problem List: Past Medical History  Diagnosis Date  . Depression   . Obesity   . Skin neoplasm   . Bunion   . Carotid stenosis   . Fibromyalgia   . Internal hemorrhoid   . GERD (gastroesophageal reflux disease)   . Chronic gastritis   . Transaminase or LDH elevation   . Mild cognitive impairment   . Hyperlipidemia   . Fatty liver   . Lumbar back pain   . Diabetic peripheral neuropathy   . Diverticulosis   . Dysphagia     no documented strictures but responded positively to dilation in past.   . Hypertension   . Cholecystitis     s/p cholecystectomy  . Sarcoidosis   . Fibromyalgia   . CHF (congestive heart failure)   . Skin cancer 1990's    "front of my right leg"  . Atrial fibrillation   . OSA (obstructive sleep apnea) 2003    "can't sleep w/that darm machine"  . Exertional dyspnea   . Hypothyroidism   . Type II diabetes mellitus   . TIA (transient ischemic attack)     07/29/11 pt denies this history  . H/O hiatal hernia   . Epileptic seizure, tonic     .No meds since age of 77.  . Osteoarthritis   . Stroke     tia    Past Surgical History  Procedure Date  . Hiatal hernia repair 1990    "had to have scar tissue removed 6 months after repair"  . Esophageal dilation   . Cataract extraction w/phaco 10/27/2010    Procedure: CATARACT EXTRACTION PHACO AND INTRAOCULAR LENS PLACEMENT (IOC);  Surgeon: [name];  Location: [facility];  Service: Ophthalmology;  Laterality: Left;  CDE- 1.78  . Cataract extraction w/phaco 07/13/2011    Procedure: CATARACT EXTRACTION PHACO AND INTRAOCULAR LENS PLACEMENT (IOC);  Surgeon: Kimberly Simmonds, MD;  Location: [facility];  Service: Ophthalmology;  Laterality: Right;  CDE:  1.65  . Liposuction 1992  . Inverted nipples 1992  . Breast surgery ~ 2010    excision milk duct; right breast  . Foot surgery ~ 2011    ~straightened toe left foot & scraped bone below big toe"  . Foot surgery ~ 2011    "scraped bone of big toe; shortened middle toe; right foot"  . Cholecystectomy 1980's  . Dilation and curettage of uterus 1970; 1976  . Hernia repair 07/29/11    multiple incarcerated VHR  . Inguinal hernia repair 1990's    left  . Abdominal hysterectomy 1980's    partial  . Tubal ligation 1976  . Bilateral oophorectomy 1980's    "after partial hysterectomy"  . Skin cancer excision 1990's    "front side of right shin"  . Ventral hernia repair 07/29/2011    Procedure: LAPAROSCOPIC VENTRAL HERNIA;  Surgeon: Kimberly Mention, MD;  Location: [facility];  Service: General;  Laterality: N/A;  multiple incarcerated hernias with mesh  Allergies:  Allergies  Allergen Reactions  . Gemfibrozil Swelling    REACTION: Angioedema  . Latex Other (See Comments)    Blisters where touched or applied  . Penicillins Hives    Will spread in patches all over the body.  . Adhesive (Tape) Other (See Comments)    Will blister skin where applied - do not use BAND-AIDS.  Marland Kitchen Codeine Hives    Will spread in patches all over the body.   PAST CARDIAC HISTORY  Cardiac cath 06/2002: Normal coronary arteries; normal LVEF (60%) Lexiscan Myoview 06/2011: normal   HPI:   The patient was recently discharged from the hospital on 08/17/11 after undergoing incarcerated ventral hernia repair complicated by post-op ileus requiring NG tube and TNA. She had been doing well post-discharge. On the date of  admission, she developed acute weakness, dizziness, anorexia, dry mouth, left arm numbness, photophobia and slurred speech. She subsequently presented to [facility] ED.   Upon ED arrival, Cr was markedly elevated at 7.81, hyponatremic (hypovolemic) at 127, hypoalbuminemic at 3.2, she was in atrial fibrillation with slow VR (HR 40s), hypotensive and digoxin level elevated at 3.5. Noncontrast head CT was unremarkable. EKG without ischemic changes. Cardiac biomarkers negative x 1. CBC unremarkable. INR therapeutic. CXR unremarkable. LDH WNL. The patient was subsuquently admitted to the medicine service and treated for suspected pre-renal azotemia. Oral antihypertensives, rate-control agents and digoxin was held. She was hydrated.   She reports "squeezing," substernal chest pain lasting for about 1 hour without radiation, rated at 5/10. She endorses aggravation on deep inspiration. She received NTG with relief of pain down to 2/10. She also received morphine which completely relieved the pain. She became diaphoretic, shortness of breath, lightheadedness, nausea, palpitations. She denies orthopnea, LE edema, PND. She reports new cough. No active bleeding. She reports experiencing a similar pain approximately 1 month ago, described as intermittent, lasting for a couple of days. No association with exertion, mostly occuring at rest. This was also associated with diaphoresis, lightheadedness and nausea. This was prior to her recent admission for ventral hernia repair. Of note, she had a normal Lexiscan Myoview in 06/2011, and clean coronaries via cath in 2004. No tobacco, alcohol or illicit drug use. She does have a family history of CAD (father MI in 19s).    Home Medications: Prior to Admission medications   Medication Sig Start Date End Date Taking? Authorizing Provider  amitriptyline (ELAVIL) 50 MG tablet Take 50 mg by mouth at bedtime. 05/08/11 05/07/12 Yes Kimberly Sidhu, MD  atenolol (TENORMIN) 25 MG tablet Take 25 mg  by mouth 2 (two) times daily. 05/08/11 05/07/12 Yes Kimberly Sidhu, MD  digoxin (LANOXIN) 0.25 MG tablet Take 250 mcg by mouth daily. 05/08/11 06/07/12 Yes Kimberly Sidhu, MD  diltiazem (DILACOR XR) 180 MG 24 hr capsule Take 180 mg by mouth daily. 05/08/11 05/07/12 Yes Kimberly Sidhu, MD  enoxaparin (LOVENOX) 120 MG/0.8ML injection Inject 0.8 mLs (120 mg total) into the skin daily. Take as directed by coumadin clinic, inject daily pre op briding as directed 07/20/11  Yes Tonny Bollman, MD  FLUoxetine (PROZAC) 20 MG capsule Take 20 mg by mouth daily. 05/08/11 05/07/12 Yes Kimberly Sidhu, MD  furosemide (LASIX) 40 MG tablet Take 40 mg by mouth daily.   Yes Historical Provider, MD  glipiZIDE (GLUCOTROL) 10 MG tablet Take 10 mg by mouth 2 (two) times daily before a meal.   Yes Historical Provider, MD  HYDROcodone-acetaminophen (VICODIN) 5-500 MG per tablet Take 1 tablet by mouth every  12 (twelve) hours as needed. For pain 05/08/11  Yes Kimberly Quitter, MD  ketorolac (ACULAR LS) 0.4 % SOLN Apply 1 drop to eye 4 (four) times daily.   Yes Historical Provider, MD  levothyroxine (SYNTHROID, LEVOTHROID) 75 MCG tablet Take 1 tablet (75 mcg total) by mouth daily. 05/08/11  Yes Kimberly Quitter, MD  lisinopril (PRINIVIL,ZESTRIL) 10 MG tablet Take 1 tablet (10 mg total) by mouth daily. 05/08/11 05/07/12 Yes Kimberly Sidhu, MD  moxifloxacin (VIGAMOX) 0.5 % ophthalmic solution Apply 1 drop to eye 3 (three) times daily.   Yes Historical Provider, MD  niacin (NIASPAN) 500 MG CR tablet Take 2 tablets (1,000 mg total) by mouth 2 (two) times daily. 04/23/11 04/22/12 Yes Kimberly Sidhu, MD  omeprazole (PRILOSEC) 20 MG capsule Take 40 mg by mouth as needed. For acid reflux 02/03/11 02/03/12 Yes Kimberly Quitter, MD  potassium chloride (K-DUR) 10 MEQ tablet Take 10 mEq by mouth daily. 05/08/11 05/07/12 Yes Kimberly Sidhu, MD  pravastatin (PRAVACHOL) 80 MG tablet Take 80 mg by mouth daily.   Yes Historical Provider, MD  promethazine (PHENERGAN) 25 MG tablet Take 25 mg  by mouth every 6 (six) hours as needed.   Yes Historical Provider, MD  warfarin (COUMADIN) 5 MG tablet Take 2.5-5 mg by mouth daily. Takes 5 mg on Monday Wednesday and Friday. Takes 2.5mg  on Tuesday, Thursday, Saturday, and Sunday. 05/08/11  Yes Kimberly Sidhu, MD  albuterol (PROVENTIL HFA;VENTOLIN HFA) 108 (90 BASE) MCG/ACT inhaler Inhale 2 puffs into the lungs every 6 (six) hours as needed. For shortness of breath 02/03/11 03/04/12  Kimberly Quitter, MD  ALPRAZolam Prudy Feeler) 0.5 MG tablet Take 1 tablet (0.5 mg total) by mouth at bedtime as needed for sleep or anxiety. 07/23/11 07/22/12  Kimberly Quitter, MD  B Complex-Biotin-FA (SUPER B-50 B COMPLEX PO) Take 1 tablet by mouth 2 (two) times daily.     Historical Provider, MD  Calcium Carbonate-Vitamin D (CALCIUM 600+D) 600-400 MG-UNIT per tablet Take 1 tablet by mouth 2 (two) times daily with a meal.     Historical Provider, MD  cyclobenzaprine (FLEXERIL) 5 MG tablet Take 5 mg by mouth 2 (two) times daily as needed. For fibromyalgia 05/08/11   Kimberly Quitter, MD  fish oil-omega-3 fatty acids 1000 MG capsule Take 1 g by mouth 2 (two) times daily.     Historical Provider, MD  gabapentin (NEURONTIN) 100 MG capsule Take 100 mg by mouth 2 (two) times daily. 05/08/11   Kimberly Quitter, MD  LOVENOX 150 MG/ML injection Inject 0.8 mLs (120 mg total) into the skin daily. 08/17/11   Kimberly Mention, MD  metFORMIN (GLUCOPHAGE) 1000 MG tablet Take 1 tablet (1,000 mg total) by mouth 2 (two) times daily with a meal. 05/08/11 05/07/12  Kimberly Quitter, MD  metoCLOPramide (REGLAN) 5 MG tablet Take 1 tablet (5 mg total) by mouth 4 (four) times daily. 08/17/11 08/27/11  Kimberly Mention, MD  Multiple Vitamin (MULTIVITAMIN) capsule Take 1 capsule by mouth daily.     Historical Provider, MD    Inpatient Medications:     . atenolol  25 mg Oral BID  . Chlorhexidine Gluconate Cloth  6 each Topical Q0600  . diltiazem  180 mg Oral Daily  . feeding supplement  1 Container Oral Q24H  . FLUoxetine   20 mg Oral Daily  . insulin aspart  0-5 Units Subcutaneous QHS  . insulin aspart  0-9 Units Subcutaneous TID WC  . insulin glargine  5 Units Subcutaneous QHS  . levothyroxine  75 mcg Oral Q0600  . mirtazapine  15 mg Oral QHS  .  morphine injection  2 mg Intravenous Once  . morphine      . mupirocin ointment  1 application Nasal BID  . nitroGLYCERIN  0.4 mg Sublingual NOW  . warfarin  2.5 mg Oral Q T,Th,S,Su-1800  . warfarin  5 mg Oral Q M,W,F-1800  . Warfarin - Physician Dosing Inpatient   Does not apply q1800   Prescriptions prior to admission  Medication Sig Dispense Refill  . amitriptyline (ELAVIL) 50 MG tablet Take 50 mg by mouth at bedtime.      Marland Kitchen atenolol (TENORMIN) 25 MG tablet Take 25 mg by mouth 2 (two) times daily.      . digoxin (LANOXIN) 0.25 MG tablet Take 250 mcg by mouth daily.      Marland Kitchen diltiazem (DILACOR XR) 180 MG 24 hr capsule Take 180 mg by mouth daily.      Marland Kitchen enoxaparin (LOVENOX) 120 MG/0.8ML injection Inject 0.8 mLs (120 mg total) into the skin daily. Take as directed by coumadin clinic, inject daily pre op briding as directed  10 Syringe  1  . FLUoxetine (PROZAC) 20 MG capsule Take 20 mg by mouth daily.      . furosemide (LASIX) 40 MG tablet Take 40 mg by mouth daily.      Marland Kitchen glipiZIDE (GLUCOTROL) 10 MG tablet Take 10 mg by mouth 2 (two) times daily before a meal.      . HYDROcodone-acetaminophen (VICODIN) 5-500 MG per tablet Take 1 tablet by mouth every 12 (twelve) hours as needed. For pain  90 tablet  1  . ketorolac (ACULAR LS) 0.4 % SOLN Apply 1 drop to eye 4 (four) times daily.      Marland Kitchen levothyroxine (SYNTHROID, LEVOTHROID) 75 MCG tablet Take 1 tablet (75 mcg total) by mouth daily.  90 tablet  3  . lisinopril (PRINIVIL,ZESTRIL) 10 MG tablet Take 1 tablet (10 mg total) by mouth daily.  90 tablet  3  . moxifloxacin (VIGAMOX) 0.5 % ophthalmic solution Apply 1 drop to eye 3 (three) times daily.      . niacin (NIASPAN) 500 MG CR tablet Take 2 tablets (1,000 mg total) by  mouth 2 (two) times daily.  360 tablet  3  . omeprazole (PRILOSEC) 20 MG capsule Take 40 mg by mouth as needed. For acid reflux      . potassium chloride (K-DUR) 10 MEQ tablet Take 10 mEq by mouth daily.      . pravastatin (PRAVACHOL) 80 MG tablet Take 80 mg by mouth daily.      . promethazine (PHENERGAN) 25 MG tablet Take 25 mg by mouth every 6 (six) hours as needed.      . warfarin (COUMADIN) 5 MG tablet Take 2.5-5 mg by mouth daily. Takes 5 mg on Monday Wednesday and Friday. Takes 2.5mg  on Tuesday, Thursday, Saturday, and Sunday.      Marland Kitchen albuterol (PROVENTIL HFA;VENTOLIN HFA) 108 (90 BASE) MCG/ACT inhaler Inhale 2 puffs into the lungs every 6 (six) hours as needed. For shortness of breath      . ALPRAZolam (XANAX) 0.5 MG tablet Take 1 tablet (0.5 mg total) by mouth at bedtime as needed for sleep or anxiety.  6 tablet  0  . B Complex-Biotin-FA (SUPER B-50 B COMPLEX PO) Take 1 tablet by mouth 2 (two) times daily.       . Calcium Carbonate-Vitamin D (CALCIUM 600+D) 600-400 MG-UNIT per tablet Take 1 tablet by  mouth 2 (two) times daily with a meal.       . cyclobenzaprine (FLEXERIL) 5 MG tablet Take 5 mg by mouth 2 (two) times daily as needed. For fibromyalgia      . fish oil-omega-3 fatty acids 1000 MG capsule Take 1 g by mouth 2 (two) times daily.       Marland Kitchen gabapentin (NEURONTIN) 100 MG capsule Take 100 mg by mouth 2 (two) times daily.      Marland Kitchen LOVENOX 150 MG/ML injection Inject 0.8 mLs (120 mg total) into the skin daily.  7 Syringe  1  . metFORMIN (GLUCOPHAGE) 1000 MG tablet Take 1 tablet (1,000 mg total) by mouth 2 (two) times daily with a meal.  180 tablet  3  . metoCLOPramide (REGLAN) 5 MG tablet Take 1 tablet (5 mg total) by mouth 4 (four) times daily.  50 tablet  1  . Multiple Vitamin (MULTIVITAMIN) capsule Take 1 capsule by mouth daily.         Family History  Problem Relation Age of Onset  . Crohn's disease Mother   . Colitis Mother     Crohns  . Anesthesia problems Mother   . Cancer  Father   . Diabetes Father   . Heart disease Father   . Melanoma Father   . Diabetes Sister   . Depression Maternal Uncle   . Dementia Maternal Uncle   . Hypotension Neg Hx   . Malignant hyperthermia Neg Hx   . Pseudochol deficiency Neg Hx      History   Social History  . Marital Status: Divorced    Spouse Name: N/A    Number of Children: 2  . Years of Education: 12th    Occupational History  . cosmotologist    Social History Main Topics  . Smoking status: Never Smoker   . Smokeless tobacco: Never Used  . Alcohol Use: No  . Drug Use: No  . Sexually Active: Not Currently   Other Topics Concern  . Not on file   Social History Narrative   Divorced, on disability, has 2 kids (son in [place], daughter in [place]), owns a house in [place], [place] and has a female roommate      Review of Systems: General: positive for lightheadedness, dizziness, weakness, negative for chills, fever, night sweats or weight changes.  Cardiovascular: positive for chest pain, shortness of breath, diaphoresis, nausea, palpitations, negative for dyspnea on exertion, edema, orthopnea, paroxysmal nocturnal dyspnea Dermatological: negative for rash Respiratory: positive for nonproductive cough or wheezing Urologic: negative for hematuria Abdominal: negative for vomiting, diarrhea, bright red blood per rectum, melena, or hematemesis Neurologic: negative for visual changes and syncope All other systems reviewed and are otherwise negative except as noted above.  Physical Exam: Blood pressure 164/82, pulse 108, temperature 99.2 F (37.3 C), temperature source Oral, resp. rate 20, height 5\' 3"  (1.6 m), weight 86.4 kg (190 lb 7.6 oz), last menstrual period 06/03/1978, SpO2 100.00%.    General: Well developed, well nourished, in no acute distress. Head: Normocephalic, atraumatic, sclera non-icteric, no xanthomas, nares are without discharge.  Neck: Negative for carotid bruits. JVD not elevated. Lungs: Clear  bilaterally to auscultation without wheezes, rales, or rhonchi. Breathing is unlabored. Heart: Irregularly irregular, tachycardic, with S1 S2. No murmurs, rubs, or gallops appreciated. Abdomen: Soft, non-tender, non-distended with normoactive bowel sounds. No hepatomegaly. No rebound/guarding. No obvious abdominal masses. Msk:  Strength and tone appears normal for age. Extremities: No clubbing, cyanosis or edema.  Distal pedal pulses  are 2+ and equal bilaterally. Neuro: Alert and oriented X 3. Moves all extremities spontaneously. Psych:  Responds to questions appropriately with a normal affect.  Labs: Recent Labs  [facility] 09/01/11 0525   WBC 9.9   HGB 11.1*   HCT 32.5*   MCV 92.6   PLT 235    Lab 09/01/11 0525 08/31/11 0817 08/30/11 0830 08/29/11 0014  NA 140 142 138 --  K 4.2 4.4 4.7 --  CL 109 111 107 --  CO2 21 18* 18* --  BUN 27* 40* 58* --  CREATININE 1.61* 2.50* 4.95* --  CALCIUM 10.1 9.8 9.2 --  PROT -- -- -- 6.8  BILITOT -- -- -- 0.3  ALKPHOS -- -- -- 83  ALT -- -- -- 16  AST -- -- -- 13  AMYLASE -- -- -- --  LIPASE -- -- -- --  GLUCOSE 129* 121* 120* --   Recent Labs  [facility] 09/01/11 0525   CKTOTAL 44   CKMB 3.5   CKMBINDEX --   TROPONINI 0.72*   Radiology/Studies: Dg Chest 2 View  08/29/2011  *RADIOLOGY REPORT*  Clinical Data: Weakness and lethargy.  History of diabetes.  CHEST - 2 VIEW  Comparison: Chest radiograph performed 08/06/2011  Findings: The lungs are well-aerated and clear.  There is no evidence of focal opacification, pleural effusion or pneumothorax.  The heart is borderline normal in size; calcification is noted within the aortic arch.  No acute osseous abnormalities are seen. Clips are noted within the right upper quadrant, reflecting prior cholecystectomy.  IMPRESSION: No acute cardiopulmonary process seen.  Original Report Authenticated By: Tonia Ghent, M.D.   Ct Head Wo Contrast  08/29/2011  *RADIOLOGY REPORT*  Clinical Data: Weakness and  slurred speech.  Hypotension.  CT HEAD WITHOUT CONTRAST  Technique:  Contiguous axial images were obtained from the base of the skull through the vertex without contrast.  Comparison: CT of the head performed 12/18/2007  Findings: There is no evidence of acute infarction, mass lesion, or intra- or extra-axial hemorrhage on CT.  Minimal beam-hardening artifact is noted at the high left frontoparietal region.  The posterior fossa, including the cerebellum, brainstem and fourth ventricle, is within normal limits.  The third and lateral ventricles, and basal ganglia are unremarkable in appearance.  The cerebral hemispheres are symmetric in appearance, with normal gray- white differentiation.  No mass effect or midline shift is seen.  There is no evidence of fracture; visualized osseous structures are unremarkable in appearance.  The orbits are within normal limits. The paranasal sinuses and mastoid air cells are well-aerated.  No significant soft tissue abnormalities are seen.  IMPRESSION: Unremarkable noncontrast CT of the head.  Original Report Authenticated By: Tonia Ghent, M.D.   US Renal  08/30/2011  *RADIOLOGY REPORT*  Clinical Data: Acute renal failure.  History of hypertension and diabetes.  RENAL/URINARY TRACT ULTRASOUND COMPLETE  Comparison:  CT abdomen and pelvis 08/06/2011.  Findings:  Right Kidney:  No hydronephrosis.  Well-preserved cortex.  Normal parenchymal echotexture without focal parenchymal abnormality. Prominent column of Bertin.  No shadowing calculi.  Approximately 12.9 cm in length.  Minimal perinephric edema as noted on the prior CT.  Left Kidney:  No hydronephrosis.  Well-preserved cortex.  Normal parenchymal echotexture without focal parenchymal abnormality.  No shadowing calculi.  Approximate 12.5 cm in length.  Bladder:  Decompressed by Foley catheter.  IMPRESSION: No significant abnormalities.  No evidence of hydronephrosis involving either kidney to suggest obstruction.  Minimal  perinephric edema on  the right, unchanged from the CT 08/06/2011.  Original Report Authenticated By: Arnell Sieving, M.D.   EKG: atrial fibrillation, 105, ST depression I, II, V3-V6; unchanged from prior tracings  ASSESSMENT AND PLAN:   1. Chest pain 2. Permanent atrial fibrillation 3. Digoxin toxicity 4. ARF 5. Type 2 DM 6. HTN 7. HL  DISCUSSION/PLAN:  The patient has had similar complaints of chest pain in the past which had ruled out for ACS. Additionally, she underwent pre-op Lexiscan Myoview in 04/13 which revealed no evidence of ischemia and normal EF. More distantly, cardiac cath in 2004 revealed normal coronary arteries and preserved LVEF. The patient was admitted with ARF (Cr 7.81 on admission-->1.61 today), and found to have digitalis toxicity (Dig level 3.5). She was bradycardic and hypotensive. Outpatient oral antihypertensives, rate-control agents, digoxin and diuretics were held. She was hydrated. Chest pain today consisted of typical and atypical features. This was relieved with NTG SL and morphine IV. This was similar to her prior episodes of chest pain. Associated troponin-I elevation in the setting of renal dysfunction is difficult to correlate with myocardial ischemia. There were no EKG changes from prior tracings noted. Upon speaking with MD, would opt for medical management initially taking her history and clinical picture into account. Will monitor cardiac biomarker trend. If up-trends/she continues to be symptomatic with renal improvement, would plan for cardiac cath. Regarding the patient's atrial fibrillation, ventricular rate has increased (90-120s on telemetry). Would add back atenolol (given renal improvement) and diltiazem for rate-control. Continue Coumadin with INR goal 2-3. Discontinue digoxin and continue holding Lasix. Will continue to follow.  Signed, R. Hurman Horn, PA-C 09/01/2011, 9:11 AM  As above, patient seen and examined. Briefly 61 year old female  with past medical history of diabetes mellitus, hypertension, hyperlipidemia, permanent atrial fibrillation, fibromyalgia, sarcoid, hypothyroidism, obstructive sleep apnea for evaluation of chest pain. Patient apparently has had problems with occasional chest pain in the past. Cardiac catheterization in 2004 showed normal coronary arteries. Myoview in April of 2013 showed an ejection fraction of 76% and normal perfusion. Patient recently had abdominal surgery. She was discharged on 07/29/2011. She was readmitted on June 22 with complaints of weakness, nausea, poor appetite and and dizziness. She was found to have acute renal failure I with associated digoxin toxicity, heart rate 46 and hypotension. Her rate controlling medications were held and she has been hydrated. The renal function is slowly improving. This morning she had an episode of chest pain. She states that she had anxiety because she couldn't sleep last p.m. She described the pain as pressure in the substernal and epigastric area. It is similar to her episodes in the past. There was some increase with inspiration. The pain did not radiate. There was associated nausea, diaphoresis and shortness of breath. The pain resolved after one hour with nitroglycerin. She is presently pain-free. Patient's initial troponin is 0.72. Electrocardiogram shows atrial fibrillation with nonspecific ST changes. Patient's chest pain has both typical and atypical features. She has had difficulties with chest pain in the past with negative evaluation. Her troponin is elevated but is difficult to interpret in the setting of acute renal failure. Recent Myoview in April showed no ischemia. Plan to continue to cycle enzymes. If no clear trend up I would favor medical therapy. If she has recurrent symptoms or further elevation in her troponin she may require cardiac catheterization once her renal function normalizes. Her heart rate is now mildly elevated. Her blood pressure has  improved with hydration. Will continue with  atenolol for her atrial fibrillation and resume Cardizem. Increase as needed. I would continue off of digoxin. Continue Coumadin with goal INR 2-3. I agree with continuing to hold Lasix for now. We will follow with you. Olga Millers  8:50 AM

## 2011-09-01 NOTE — Consult Note (Signed)
Patient Identification:  [name] Date of Evaluation:  09/01/2011 Reason for Consult: Depression, suppressed appetite  Referring Provider: Cr. Schoeder  History of Present Illness:pt has DM CHF HTN hypothroidism and Afib  She came to ED for weakness and dizziness.  She says she has been depressed for a long time.  She had some symptoms suggesting she might have a stoke and was encouraged to come to the ED Past Psychiatric History:Depression since her divorce and subsequent murder of ex-husband by second wife.   She has lost her appetite anorexia s/p herniorrhaphy a few months ago   Past Medical History:     Past Medical History  Diagnosis Date  . Depression   . Obesity   . Skin neoplasm   . Bunion   . Carotid stenosis   . Fibromyalgia   . Internal hemorrhoid   . GERD (gastroesophageal reflux disease)   . Chronic gastritis   . Transaminase or LDH elevation   . Mild cognitive impairment   . Hyperlipidemia   . Fatty liver   . Lumbar back pain   . Diabetic peripheral neuropathy   . Diverticulosis   . Dysphagia     no documented strictures but responded positively to dilation in past.   . Hypertension   . Cholecystitis     s/p cholecystectomy  . Sarcoidosis   . Fibromyalgia   . CHF (congestive heart failure)   . Skin cancer 1990's    "front of my right leg"  . Atrial fibrillation   . OSA (obstructive sleep apnea) 2003    "can't sleep w/that darm machine"  . Exertional dyspnea   . Hypothyroidism   . Type II diabetes mellitus   . TIA (transient ischemic attack)     07/29/11 pt denies this history  . H/O hiatal hernia   . Epileptic seizure, tonic     .No meds since age of 61.  . Osteoarthritis   . Stroke     tia       Past Surgical History  Procedure Date  . Hiatal hernia repair 1990    "had to have scar tissue removed 6 months after repair"  . Esophageal dilation   . Cataract extraction w/phaco 10/27/2010    Procedure: CATARACT EXTRACTION PHACO AND  INTRAOCULAR LENS PLACEMENT (IOC);  Surgeon: Susa Simmonds;  Location: [facility];  Service: Ophthalmology;  Laterality: Left;  CDE- 1.78  . Cataract extraction w/phaco 07/13/2011    Procedure: CATARACT EXTRACTION PHACO AND INTRAOCULAR LENS PLACEMENT (IOC);  Surgeon: Susa Simmonds, MD;  Location: [facility];  Service: Ophthalmology;  Laterality: Right;  CDE:  1.65  . Liposuction 1992  . Inverted nipples 1992  . Breast surgery ~ 2010    excision milk duct; right breast  . Foot surgery ~ 2011    ~straightened toe left foot & scraped bone below big toe"  . Foot surgery ~ 2011    "scraped bone of big toe; shortened middle toe; right foot"  . Cholecystectomy 1980's  . Dilation and curettage of uterus 1970; 1976  . Hernia repair 07/29/11    multiple incarcerated VHR  . Inguinal hernia repair 1990's    left  . Abdominal hysterectomy 1980's    partial  . Tubal ligation 1976  . Bilateral oophorectomy 1980's    "after partial hysterectomy"  . Skin cancer excision 1990's    "front side of right shin"  . Ventral hernia repair 07/29/2011    Procedure: LAPAROSCOPIC VENTRAL HERNIA;  Surgeon:  Ernestene Mention, MD;  [facility];  Service: General;  Laterality: N/A;  multiple incarcerated hernias with mesh    Allergies:  Allergies  Allergen Reactions  . Gemfibrozil Swelling    REACTION: Angioedema  . Latex Other (See Comments)    Blisters where touched or applied  . Penicillins Hives    Will spread in patches all over the body.  . Adhesive (Tape) Other (See Comments)    Will blister skin where applied - do not use BAND-AIDS.  Marland Kitchen Codeine Hives    Will spread in patches all over the body.    Current Medications:  Prior to Admission medications   Medication Sig Start Date End Date Taking? Authorizing Provider  amitriptyline (ELAVIL) 50 MG tablet Take 50 mg by mouth at bedtime. 05/08/11 05/07/12 Yes Amanjot Sidhu, MD  atenolol (TENORMIN) 25 MG tablet Take 25 mg by mouth 2 (two) times daily. 05/08/11  05/07/12 Yes Amanjot Sidhu, MD  digoxin (LANOXIN) 0.25 MG tablet Take 250 mcg by mouth daily. 05/08/11 06/07/12 Yes Amanjot Sidhu, MD  diltiazem (DILACOR XR) 180 MG 24 hr capsule Take 180 mg by mouth daily. 05/08/11 05/07/12 Yes Amanjot Sidhu, MD  enoxaparin (LOVENOX) 120 MG/0.8ML injection Inject 0.8 mLs (120 mg total) into the skin daily. Take as directed by coumadin clinic, inject daily pre op briding as directed 07/20/11  Yes Tonny Bollman, MD  FLUoxetine (PROZAC) 20 MG capsule Take 20 mg by mouth daily. 05/08/11 05/07/12 Yes Amanjot Sidhu, MD  furosemide (LASIX) 40 MG tablet Take 40 mg by mouth daily.   Yes Historical Provider, MD  glipiZIDE (GLUCOTROL) 10 MG tablet Take 10 mg by mouth 2 (two) times daily before a meal.   Yes Historical Provider, MD  HYDROcodone-acetaminophen (VICODIN) 5-500 MG per tablet Take 1 tablet by mouth every 12 (twelve) hours as needed. For pain 05/08/11  Yes Melida Quitter, MD  ketorolac (ACULAR LS) 0.4 % SOLN Apply 1 drop to eye 4 (four) times daily.   Yes Historical Provider, MD  levothyroxine (SYNTHROID, LEVOTHROID) 75 MCG tablet Take 1 tablet (75 mcg total) by mouth daily. 05/08/11  Yes Melida Quitter, MD  lisinopril (PRINIVIL,ZESTRIL) 10 MG tablet Take 1 tablet (10 mg total) by mouth daily. 05/08/11 05/07/12 Yes Amanjot Sidhu, MD  moxifloxacin (VIGAMOX) 0.5 % ophthalmic solution Apply 1 drop to eye 3 (three) times daily.   Yes Historical Provider, MD  niacin (NIASPAN) 500 MG CR tablet Take 2 tablets (1,000 mg total) by mouth 2 (two) times daily. 04/23/11 04/22/12 Yes Amanjot Sidhu, MD  omeprazole (PRILOSEC) 20 MG capsule Take 40 mg by mouth as needed. For acid reflux 02/03/11 02/03/12 Yes Melida Quitter, MD  potassium chloride (K-DUR) 10 MEQ tablet Take 10 mEq by mouth daily. 05/08/11 05/07/12 Yes Amanjot Sidhu, MD  pravastatin (PRAVACHOL) 80 MG tablet Take 80 mg by mouth daily.   Yes Historical Provider, MD  promethazine (PHENERGAN) 25 MG tablet Take 25 mg by mouth every 6 (six) hours as  needed.   Yes Historical Provider, MD  warfarin (COUMADIN) 5 MG tablet Take 2.5-5 mg by mouth daily. Takes 5 mg on Monday Wednesday and Friday. Takes 2.5mg  on Tuesday, Thursday, Saturday, and Sunday. 05/08/11  Yes Amanjot Sidhu, MD  albuterol (PROVENTIL HFA;VENTOLIN HFA) 108 (90 BASE) MCG/ACT inhaler Inhale 2 puffs into the lungs every 6 (six) hours as needed. For shortness of breath 02/03/11 03/04/12  Melida Quitter, MD  ALPRAZolam Prudy Feeler) 0.5 MG tablet Take 1 tablet (0.5 mg total) by mouth at  bedtime as needed for sleep or anxiety. 07/23/11 07/22/12  Melida Quitter, MD  B Complex-Biotin-FA (SUPER B-50 B COMPLEX PO) Take 1 tablet by mouth 2 (two) times daily.     Historical Provider, MD  Calcium Carbonate-Vitamin D (CALCIUM 600+D) 600-400 MG-UNIT per tablet Take 1 tablet by mouth 2 (two) times daily with a meal.     Historical Provider, MD  cyclobenzaprine (FLEXERIL) 5 MG tablet Take 5 mg by mouth 2 (two) times daily as needed. For fibromyalgia 05/08/11   Melida Quitter, MD  fish oil-omega-3 fatty acids 1000 MG capsule Take 1 g by mouth 2 (two) times daily.     Historical Provider, MD  gabapentin (NEURONTIN) 100 MG capsule Take 100 mg by mouth 2 (two) times daily. 05/08/11   Melida Quitter, MD  LOVENOX 150 MG/ML injection Inject 0.8 mLs (120 mg total) into the skin daily. 08/17/11   Ernestene Mention, MD  metFORMIN (GLUCOPHAGE) 1000 MG tablet Take 1 tablet (1,000 mg total) by mouth 2 (two) times daily with a meal. 05/08/11 05/07/12  Melida Quitter, MD  metoCLOPramide (REGLAN) 5 MG tablet Take 1 tablet (5 mg total) by mouth 4 (four) times daily. 08/17/11 08/27/11  Ernestene Mention, MD  Multiple Vitamin (MULTIVITAMIN) capsule Take 1 capsule by mouth daily.     Historical Provider, MD    Social History:    reports that she has never smoked. She has never used smokeless tobacco. She reports that she does not drink alcohol or use illicit drugs.   Family History:    Family History  Problem Relation Age of Onset  .  Crohn's disease Mother   . Colitis Mother     Crohns  . Anesthesia problems Mother   . Cancer Father   . Diabetes Father   . Heart disease Father   . Melanoma Father   . Diabetes Sister   . Depression Maternal Uncle   . Dementia Maternal Uncle   . Hypotension Neg Hx   . Malignant hyperthermia Neg Hx   . Pseudochol deficiency Neg Hx     Mental Status Examination/Evaluation: Objective:  Appearance: appears sick [with headache] and obese  Psychomotor Activity:  Psychomotor Retardation  Eye Contact::  Fair  Speech:  Clear and Coherent  Volume:  Decreased  Mood:  Dysphoric  Affect:  Congruent and Depressed  Thought Process:  Coherent and Intact  Orientation:  Full  Thought Content: loss of independence with illnesses, grief  Suicidal Thoughts:  No  Homicidal Thoughts:  No  Judgement:  Fair  Insight:  Fair    DIAGNOSIS:   AXIS I   Major Depressive Disorder, recurrent, severe  AXIS II  Deffered  AXIS III See medical notes.  AXIS IV economic problems, other psychosocial or environmental problems and problems with primary support group  AXIS V 61-70 mild symptoms   Assessment/Plan:Discussed with Dr. Florian Buff, Psych CSW  Pt is lying with cloth on forehead  She c/o headache and speaks as if in pain.  She says she cannot eat since she had 5 hernias  [abdominal]repaired. She is having bowel movements so digestion appears to be intact.  She developed digitalis toxemia because she kept taking pills be did not continue to eat.  Therefore levels became abnormal.  She grieves for her deceased ex-husband who was murdered by his wife.   She has two children.  Her son has helped her since her surgery.  She denies SI/HI  She denies VH/AH.  She has a lot of  medical conditions to attend to.  Several options to stimulate appetite are discussed with pt.  RECOMMENDATION:  1. Agree, discontinue Elavil, amitriptyline.  2. Consider cyproheptadine to stimulate appetite. 3.  Pt has taken Prozac 20 mg  for 10 yrs.  Consider increase to 40 mg daily.  4. No further psychiatric needs unless requested.  MD Psychiatrist signs off Callee Rohrig J. Ferol Luz, MD Psychiatrist  09/01/2011 8:01 PM

## 2011-09-02 ENCOUNTER — Inpatient Hospital Stay (HOSPITAL_COMMUNITY): Payer: Medicare Other

## 2011-09-02 DIAGNOSIS — I498 Other specified cardiac arrhythmias: Secondary | ICD-10-CM

## 2011-09-02 DIAGNOSIS — I4891 Unspecified atrial fibrillation: Secondary | ICD-10-CM

## 2011-09-02 LAB — BASIC METABOLIC PANEL
BUN: 20 mg/dL (ref 6–23)
CO2: 18 mEq/L — ABNORMAL LOW (ref 19–32)
Chloride: 108 mEq/L (ref 96–112)
Creatinine, Ser: 1.15 mg/dL — ABNORMAL HIGH (ref 0.50–1.10)
GFR calc Af Amer: 57 mL/min — ABNORMAL LOW (ref >90)
GFR calc non Af Amer: 50 mL/min — ABNORMAL LOW (ref >90)
Glucose, Bld: 124 mg/dL — ABNORMAL HIGH (ref 70–99)
Potassium: 4.1 mEq/L (ref 3.5–5.1)
Sodium: 138 mEq/L (ref 135–145)

## 2011-09-02 LAB — CBC
HCT: 29.4 % — ABNORMAL LOW (ref 36.0–46.0)
Hemoglobin: 10.1 g/dL — ABNORMAL LOW (ref 12.0–15.0)
MCH: 31.7 pg (ref 26.0–34.0)
MCHC: 34.4 g/dL (ref 30.0–36.0)
Platelets: 209 10*3/uL (ref 150–400)
RBC: 3.19 MIL/uL — ABNORMAL LOW (ref 3.87–5.11)
RDW: 13.1 % (ref 11.5–15.5)
WBC: 10.2 10*3/uL (ref 4.0–10.5)

## 2011-09-02 LAB — GLUCOSE, CAPILLARY
Glucose-Capillary: 116 mg/dL — ABNORMAL HIGH (ref 70–99)
Glucose-Capillary: 127 mg/dL — ABNORMAL HIGH (ref 70–99)

## 2011-09-02 LAB — PROTIME-INR: INR: 4 — ABNORMAL HIGH (ref 0.00–1.49)

## 2011-09-02 MED ORDER — ONDANSETRON HCL 4 MG PO TABS
4.0000 mg | ORAL_TABLET | Freq: Two times a day (BID) | ORAL | Status: DC
  Administered 2011-09-02 – 2011-09-04 (×4): 4 mg via ORAL
  Filled 2011-09-02 (×6): qty 1

## 2011-09-02 MED ORDER — POLYETHYLENE GLYCOL 3350 17 G PO PACK
17.0000 g | PACK | Freq: Every day | ORAL | Status: DC | PRN
  Administered 2011-09-02: 17 g via ORAL
  Filled 2011-09-02: qty 1

## 2011-09-02 MED ORDER — METOCLOPRAMIDE HCL 5 MG PO TABS
5.0000 mg | ORAL_TABLET | Freq: Four times a day (QID) | ORAL | Status: DC
  Administered 2011-09-02 – 2011-09-04 (×9): 5 mg via ORAL
  Filled 2011-09-02 (×12): qty 1

## 2011-09-02 MED ORDER — DOCUSATE SODIUM 100 MG PO CAPS
100.0000 mg | ORAL_CAPSULE | Freq: Two times a day (BID) | ORAL | Status: DC
  Administered 2011-09-02 – 2011-09-03 (×3): 100 mg via ORAL
  Filled 2011-09-02 (×6): qty 1

## 2011-09-02 MED ORDER — METOLAZONE 5 MG PO TABS: 5.0000 mg | ORAL_TABLET | Freq: Four times a day (QID) | ORAL | Status: DC

## 2011-09-02 MED ORDER — ONDANSETRON HCL 4 MG/2ML IJ SOLN: 4.0000 mg | Freq: Two times a day (BID) | INTRAMUSCULAR | Status: DC

## 2011-09-02 NOTE — Progress Notes (Signed)
ANTICOAGULATION CONSULT NOTE - Follow Up Consult  Pharmacy Consult for Coumadin Indication: atrial fibrillation  Allergies  Allergen Reactions  . Gemfibrozil Swelling    REACTION: Angioedema  . Latex Other (See Comments)    Blisters where touched or applied  . Penicillins Hives    Will spread in patches all over the body.  . Adhesive (Tape) Other (See Comments)    Will blister skin where applied - do not use BAND-AIDS.  Marland Kitchen Codeine Hives    Will spread in patches all over the body.    Patient Measurements: Height: 5\' 3"  (160 cm) Weight: 190 lb 11.2 oz (86.5 kg) IBW/kg (Calculated) : 52.4  Heparin Dosing Weight:   Vital Signs: Temp: 100.4 F (38 C) (06/26 0457) Temp src: Oral (06/26 0457) BP: 147/88 mmHg (06/26 0457) Pulse Rate: 90  (06/26 0457)  Labs:  Alvira Philips 09/02/11 0515 09/01/11 2133 09/01/11 1304 09/01/11 0525 08/31/11 0817 08/31/11 0425 08/30/11 0830  HGB -- -- -- 11.1* -- -- --  HCT -- -- -- 32.5* -- -- --  PLT -- -- -- 235 -- -- --  APTT -- -- -- -- -- -- --  LABPROT 39.6* -- -- 36.8* -- 29.4* --  INR 4.00* -- -- 3.64* -- 2.73* --  HEPARINUNFRC -- -- -- -- -- -- --  CREATININE -- -- -- 1.61* 2.50* -- 4.95*  CKTOTAL -- 42 45 44 -- -- --  CKMB -- 3.5 3.9 3.5 -- -- --  TROPONINI -- 0.56* 0.69* 0.72* -- -- --    Estimated Creatinine Clearance: 37.3 ml/min (by C-G formula based on Cr of 1.61).  Assessment: 61yof on chronic Coumadin for Afib. Pharmacy now consulted to manage. INR (4.0) remains supratherapeutic despite patient remaining on home regimen (2.5mg  daily except 5mg  MWF). Noted pt has had deceased appetite. - H/H and Plts trending down - No significant bleeding reported  Goal of Therapy:  INR 2-3   Plan:  1. No Coumadin tonight 2. Daily PT/INR  Talbert Cage [place] [phone] 09/02/2011,8:16 AM

## 2011-09-02 NOTE — Consult Note (Signed)
Clinical Social Work Progress Note PSYCHIATRY SERVICE LINE 09/02/2011  Patient:  [name]  Account:  [id number]  Admit Date:  08/28/2011  Clinical Social Worker:  Ashley Jacobs, LCSW  Date/Time:  09/02/2011 10:07 AM  Review of Patient  Overall Medical Condition:   Patient overall the same.  Calm and cooperative, still tearful.  Spoke at length with patient son who reports that patient has a past eating disorder problems where she withholds food.  Patient in the early 80's and 90's use to be very thin, anorexia and her mother also had issues with food where she had to be institutionalized.  Patient grandmother as well had issues with Laxatives.  Son reports he feels patient is withholding food because she is happy with her weight at the current time and does not want to regain weight. He reports when she first came home from the hospital, she stayed with him and his partner and she was motivated and excited to get home. She was eating and happy.  He reports this all changed a few days later when patient refused to eat in fear of gaining weight.  Patient has never been institutionalized per his report, but he reports she was active with a counselor [facility] within the last year.  Son reports that if she goes home, she will have a friend there who has been staying in her home, but eventually she will be moving out into another housing area.  Reports him and sister have been talking about SNF and rehab for patient.  Feel at this time, patient needs 24 hour care and assistance because of her surgery, but also because she will not eat and has poor PO intake.  Medications that were recommended were discussed with son who reports he is agreeable and also agreeable to PT/OT consult to review needs at dc.   Participation Level:  Active  Participation Quality  Drowsy  Resistant   Other Participation Quality:   Patient son very informative and very concerned over mother.  Reports she is  noncompliant at home with food, medications, and checking her blood sugar and he is concerned what will happen to her if she returns home in this state.  He lives an hour away. Reports he can talk to her regularly but does not see her every day   Affect  Flat  Depressed   Cognitive  Alert  Oriented   Reaction to Medications/Concerns:   Patient reports she is nauesas and in pain.  Not eating.   Modes of Intervention  Behaviors/Psychosis  Education  Exploration  Support   Summary of Progress/Plan at Discharge   Will await PT/OT evaluation and recommendations.  ?SNF at dc to help maximize independence and medical compliance.  Outpatient follow up will be important and information placed on DC paperwork for patient.  Will follow up with disposition home vs SNF.    Dr. Ferol Luz with Psychiatry has been updated and agreeable to plan and findings.    Ashley Jacobs, MSW LCSW [phone]

## 2011-09-02 NOTE — Progress Notes (Signed)
Internal Medicine Teaching Service Attending Note Date: 09/02/2011  Patient name: [name]  Medical record number: [id number]  Date of birth: 1950/12/17    This patient has been seen and discussed with the house staff. Please see their note for complete details. I concur with their findings with the following additions/corrections:  Greatly appreciate Cardiology, Nephrology, Psychiatry, CM, PT, OT help with [name].  We spent greater than 45 minutes with the patient including greater than 50% of time in face to face counsel of the patient and in coordination of their care, including greater than 30 minutes I spent talking to the daughter and the patient.  Daughter was concerned about Mom's condition and does not trust her to do well in SNF.  I updated her on pts improved renal fxn, problems we had with digoxin toxicity, then AF w RVR but improvement in those areas. I outlined major issue of lack of appetite poor intake, depression. Daughter also concerned about low grade fever mother has had.   She also was specifically upset that her mother apparently did not receive a drink of water for over an hour when pt had rung the RN. I informed daughter that I would let RN staff know about this and that in a busy hospital this is NOt an unusual occurrence for their to be a delay. She then stated that she did not "want to have to move her Mother to [facility] or [facility]."  Today we will check plain film of the abdomen due to our concerns about pt not having bowel movement and her recent problems with ileus. We will also get CXR though my suspicion for PNA is low, she may have atelectasis. Clearly if she does not improve Po intake, engage with PT, OT she will make it easier for herself to acquire a nosocomial infection.  I have asked our housestaff to also contact risk management given concerns  Voiced bydaughter and pts complicated course earlier this month with readmission.   Acey Lav 09/02/2011, 12:25 PM

## 2011-09-02 NOTE — Progress Notes (Addendum)
Subjective: No acute events overnight, no chest pain  Objective: Vital signs in last 24 hours: Filed Vitals:   09/01/11 2135 09/01/11 2140 09/02/11 0200 09/02/11 0457  BP: 158/89 158/89  147/88  Pulse: 97 97  90  Temp:  100.3 F (37.9 C) 100 F (37.8 C) 100.4 F (38 C)  TempSrc:  Oral Oral Oral  Resp:  19  20  Height:      Weight:  190 lb 11.2 oz (86.5 kg)    SpO2:  88%  83%   Weight change: 3.5 oz (0.1 kg)  Intake/Output Summary (Last 24 hours) at 09/02/11 0914 Last data filed at 09/02/11 0300  Gross per 24 hour  Intake 843.75 ml  Output    900 ml  Net -56.25 ml   BP 147/88  Pulse 90  Temp 100.4 F (38 C) (Oral)  Resp 20  Ht 5\' 3"  (1.6 m)  Wt 190 lb 11.2 oz (86.5 kg)  BMI 33.78 kg/m2  SpO2 83%  LMP 06/03/1978  Physical Exam: General: Well-developed, well-nourished, in no acute distress; Head: Normocephalic, atraumatic. Lungs: Normal respiratory effort. Clear to auscultation bilaterally from apices to bases without crackles or wheezes appreciated. Heart: irregular rhythm rate controlled, normal S1 and S2, no gallop, murmur, or rubs appreciated. Abdomen: Obese, BS normoactive. Soft, non-tender. No masses or organomegaly appreciated. Extremities: No pretibial edema, distal pulses intact Neurologic: grossly non-focal, alert and oriented x3 Psych: cooperative with exam     Lab Results: Basic Metabolic Panel:  Lab 09/01/11 4098 08/31/11 0817  NA 140 142  K 4.2 4.4  CL 109 111  CO2 21 18*  GLUCOSE 129* 121*  BUN 27* 40*  CREATININE 1.61* 2.50*  CALCIUM 10.1 9.8  MG -- --  PHOS 2.3 --   Liver Function Tests:  Lab 09/01/11 0525 08/29/11 0014  AST -- 13  ALT -- 16  ALKPHOS -- 83  BILITOT -- 0.3  PROT -- 6.8  ALBUMIN 2.8* 3.2*   CBC:  Lab 09/01/11 0525 08/29/11 0014  WBC 9.9 9.5  NEUTROABS -- 6.0  HGB 11.1* 12.2  HCT 32.5* 34.9*  MCV 92.6 89.5  PLT 235 327   Cardiac Enzymes:  Lab 09/01/11 2133 09/01/11 1304 09/01/11 0525  CKTOTAL 42 45 44    CKMB 3.5 3.9 3.5  CKMBINDEX -- -- --  TROPONINI 0.56* 0.69* 0.72*   CBG:  Lab 09/02/11 0809 09/01/11 2132 09/01/11 1638 09/01/11 1131 09/01/11 0956 08/31/11 2135  GLUCAP 116* 140* 109* 116* 117* 137*   Coagulation:  Lab 09/02/11 0515 09/01/11 0525 08/31/11 0425 08/30/11 0340  LABPROT 39.6* 36.8* 29.4* 31.3*  INR 4.00* 3.64* 2.73* 2.96*   Urinalysis:  Lab 08/31/11 1424 08/29/11 0256  COLORURINE AMBER* AMBER*  LABSPEC 1.020 1.020  PHURINE 5.0 5.0  GLUCOSEU NEGATIVE NEGATIVE  HGBUR NEGATIVE NEGATIVE  BILIRUBINUR MODERATE* MODERATE*  KETONESUR 15* 15*  PROTEINUR NEGATIVE NEGATIVE  UROBILINOGEN 0.2 0.2  NITRITE NEGATIVE NEGATIVE  LEUKOCYTESUR SMALL* TRACE*     Micro Results: Recent Results (from the past 240 hour(s))  MRSA PCR SCREENING     Status: Abnormal   Collection Time   08/29/11 10:00 AM      Component Value Range Status Comment   MRSA by PCR POSITIVE (*) NEGATIVE Final    Studies/Results: No results found. Medications: I have reviewed the patient's current medications. Scheduled Meds:    . atenolol  25 mg Oral BID  . Chlorhexidine Gluconate Cloth  6 each Topical Q0600  . cyproheptadine  2 mg Oral Q6H  . diltiazem  180 mg Oral Daily  . feeding supplement  1 Container Oral Q24H  . FLUoxetine  40 mg Oral Daily  . insulin aspart  0-5 Units Subcutaneous QHS  . insulin aspart  0-9 Units Subcutaneous TID WC  . insulin glargine  5 Units Subcutaneous QHS  . levothyroxine  75 mcg Oral Q0600  . mirtazapine  15 mg Oral QHS  . mupirocin ointment  1 application Nasal BID  . ondansetron  4 mg Oral Q12H   Or  . ondansetron (ZOFRAN) IV  4 mg Intravenous Q12H  . senna-docusate  1 tablet Oral QHS  . Warfarin - Pharmacist Dosing Inpatient   Does not apply q1800  . DISCONTD: FLUoxetine  20 mg Oral Daily  . DISCONTD: warfarin  2.5 mg Oral Q T,Th,S,Su-1800  . DISCONTD: warfarin  5 mg Oral Q M,W,F-1800  . DISCONTD: Warfarin - Physician Dosing Inpatient   Does not apply  q1800   Continuous Infusions:    . sodium chloride 75 mL/hr at 09/02/11 0507   PRN Meds:.acetaminophen, acetaminophen, albuterol, DISCONTD: ondansetron (ZOFRAN) IV, DISCONTD: ondansetron  Assessment/Plan: HD #3 for 61 yo female w/ Afib, diabetes mellitus, hypertension and Depression admitted with digoxin toxicity secondary to acute renal insufficiency from inadequate oral intake  #1 Chest pain: in setting of elevated troponin and Afib with RVR, resolved quickly, troponin enzymes trending towards normal, CK-Mb remains wnl, appreciate Cardiology recs, O2 sats 83-89 on 3 L nasal canula -cont  Atenolol -cont Diltiazem -cont hold ACE-I given AKI -check CXR pa/lat to r/o infiltrate given mildly febrile and , mild hypoxia -OOB to chair -incentive spirometry to bedside  Cardiac Panel (last 3 results)  Basename 09/01/11 2133 09/01/11 1304 09/01/11 0525  CKTOTAL 42 45 44  CKMB 3.5 3.9 3.5  TROPONINI 0.56* 0.69* 0.72*  RELINDX RELATIVE INDEX IS INVALID RELATIVE INDEX IS INVALID RELATIVE INDEX IS INVALID   #2 Acute Renal Failure:  Continuing to improve after hydration and discontinuation of digoxin, Renal Service signed off -creatinine level pending this morning -adequate urine output  -cont to monitor  Lab 09/01/11 0525 08/31/11 0817 08/30/11 0830 08/29/11 1028 08/29/11 0418  CREATININE 1.61* 2.50* 4.95* 6.80* 7.32*    #3 Depression: depressed for ~ a decade but no change in her long term dosing of Prozac 20 mg, last evaluated at [facility] at least 6-9 months ago, + grief from murder of ex-husband 2-3 years ago -cont Prozac increase to 40 mg qd - cont 15 mg qhs Remeron for mood and appetite stimulation -cont cyproheptadine for appetite stimulation per Psych recommendation with monitoring for possible urinary retention or constipation  #4 Inadequate oral intake: mostly secondary to depression, Nutrition consulted, eating ~10% of her meals, low albumin, weight stable, pt  refuses consideration of enteral feeding, reports Resource Breeze made her nauseous and told Psych SW that all food makes her nauseous -Registered Dietician consult -appreciate Nutrition Consult recs -schedule Zofran dosing -cont IV fluids -cont [place] with cyproheptadine tx   Filed Weights   08/31/11 0258 08/31/11 2049 09/01/11 2140  Weight: 190 lb 7.6 oz (86.4 kg) 190 lb 7.6 oz (86.4 kg) 190 lb 11.2 oz (86.5 kg)    #5 Hypertension: Bp 147/88 this morning -cont atenolol 25 mg bid and diltiazem -cont hold lisinopril and digoxin  #6 Atrial Fibrillation :  Currently rate controlled, INR supra-therapeutic 4.0, likely secondary to initiation of Remeron - continue coumadin for anticoagulation per Pharmacy -cont atenolol 25 mg  bid -cont diltiazem   #7 Type 2 Diabetic Mellitus: A1c = 6.3 on 07/29/11. CBG's are well controlled.  - Continue  SSI  - CBG checks ACHS   CBG (last 3)   Basename 09/02/11 0809 09/01/11 2132 09/01/11 1638  GLUCAP 116* 140* 109*   #8 Hypotension: Resolved -cont to hold all antihypertensive medications, in setting of AKI  #9 Digoxin Toxicity : Resolved - cont telemetry monitoring  #10 Elevated AG: resolved   #11 Hyponatremia:  Resolved  Lab 09/01/11 0525 08/31/11 0817 08/30/11 0830 08/29/11 1028 08/29/11 0418  NA 140 142 138 130* 131*   #12 Hypothyroidism: Last TSH = 0.756 (wnl) on 07/31/11.  -Continue home dose levothyroxine.  #13 s/p ventral hernia repair (5/22): complicated by post op ileus and gastroparesis, Abd XR today w/o signs of ileus   #14 VTE ppx: Anticoagulation with coumadin -dosing per Pharmacy  #15 Disposition: anticipate SNF placement, pt aggreaable -consult Social Work    LOS: 4 days   Kristie Cowman 09/02/2011, 9:14 AM  Internal Medicine Teaching Service Attending Note Date: 09/02/2011  Patient name: TARRIE MCMICHEN  Medical record number: [id number]  Date of birth: [birth year]    This patient has been seen and  discussed with the house staff. Please see their note for complete details. I concur with their findings with the following additions/corrections: Patient with epigastric and chest pain overnight in context of AF with RVR with minimally elevated troponin. CP resolved. She is being seen by Cardiology who will consider cardiac cath IF pts pain returns or troponins elevate and renal fxn is improved.  With re to her atrial fibrillation we can always add in shorter acting cardizem every 6 hours if there is concern for causing bradycardia with reintroduction of more nodal blocking agents.  Kilian Schwartz 09/02/2011, 9:14 AM

## 2011-09-02 NOTE — Progress Notes (Signed)
SUBJECTIVE:  No further chest pain.  No SOB   PHYSICAL EXAM Filed Vitals:   09/01/11 2135 09/01/11 2140 09/02/11 0200 09/02/11 0457  BP: 158/89 158/89  147/88  Pulse: 97 97  90  Temp:  100.3 F (37.9 C) 100 F (37.8 C) 100.4 F (38 C)  TempSrc:  Oral Oral Oral  Resp:  19  20  Height:      Weight:  190 lb 11.2 oz (86.5 kg)    SpO2:  88%  83%   General:  No distress Lungs:  Clear Heart:  Irregular Abdomen:  Positive bowel sounds, no rebound no guarding Extremities:  No edema.  LABS: Lab Results  Component Value Date   CKTOTAL 42 09/01/2011   CKMB 3.5 09/01/2011   TROPONINI 0.56* 09/01/2011   Results for orders placed during the hospital encounter of 08/29/11 (from the past 24 hour(s))  GLUCOSE, CAPILLARY     Status: Abnormal   Collection Time   09/01/11  9:56 AM      Component Value Range   Glucose-Capillary 117 (*) 70 - 99 mg/dL  GLUCOSE, CAPILLARY     Status: Abnormal   Collection Time   09/01/11 11:31 AM      Component Value Range   Glucose-Capillary 116 (*) 70 - 99 mg/dL  CARDIAC PANEL(CRET KIN+CKTOT+MB+TROPI)     Status: Abnormal   Collection Time   09/01/11  1:04 PM      Component Value Range   Total CK 45  7 - 177 U/L   CK, MB 3.9  0.3 - 4.0 ng/mL   Troponin I 0.69 (*) <0.30 ng/mL   Relative Index RELATIVE INDEX IS INVALID  0.0 - 2.5  GLUCOSE, CAPILLARY     Status: Abnormal   Collection Time   09/01/11  4:38 PM      Component Value Range   Glucose-Capillary 109 (*) 70 - 99 mg/dL  GLUCOSE, CAPILLARY     Status: Abnormal   Collection Time   09/01/11  9:32 PM      Component Value Range   Glucose-Capillary 140 (*) 70 - 99 mg/dL  CARDIAC PANEL(CRET KIN+CKTOT+MB+TROPI)     Status: Abnormal   Collection Time   09/01/11  9:33 PM      Component Value Range   Total CK 42  7 - 177 U/L   CK, MB 3.5  0.3 - 4.0 ng/mL   Troponin I 0.56 (*) <0.30 ng/mL   Relative Index RELATIVE INDEX IS INVALID  0.0 - 2.5  PROTIME-INR     Status: Abnormal   Collection Time   09/02/11  5:15 AM      Component Value Range   Prothrombin Time 39.6 (*) 11.6 - 15.2 seconds   INR 4.00 (*) 0.00 - 1.49  GLUCOSE, CAPILLARY     Status: Abnormal   Collection Time   09/02/11  8:09 AM      Component Value Range   Glucose-Capillary 116 (*) 70 - 99 mg/dL   Comment 1 Notify RN     Comment 2 Documented in Chart      Intake/Output Summary (Last 24 hours) at 09/02/11 0824 Last data filed at 09/02/11 0300  Gross per 24 hour  Intake 903.75 ml  Output    900 ml  Net   3.75 ml    ASSESSMENT AND PLAN:   Atrial fibrillation:  Better rate control on beta blocker and calcium channel blocker.  Warfarin per pharmacy.   Precordial pain:  No objective  evidence of ischemia.  Her mild troponin rise is nonspecific.  No further work up.   HTN:  Back on atenolol and Diltiazem.  Heart rate is tolerating this.  BP still slightly high.  We can adjust medications further based on future readings.  Holding ACE inhibitor.   Fayrene Fearing [facility] 09/02/2011 8:24 AM

## 2011-09-02 NOTE — Evaluation (Signed)
Occupational Therapy Evaluation Patient Details Name: [name] MRN: [id number] DOB: June 30, 1950 Today's Date: 09/02/2011 Time: 3086-5784 OT Time Calculation (min): 35 min  OT Assessment / Plan / Recommendation Clinical Impression  Pt admitted with digoxin toxicity, weakness, dizziness and decreased appetite.  Pt with recent admission for repair of hernias.  Hospital course has been complicated by CP and elevated troponins as well as depression.  Pt is motivated to return to her home.  She demonstrates generalized weakness particularly with mobility and standing ADL.  Will follow acutely.     OT Assessment  Patient needs continued OT Services    Follow Up Recommendations  Skilled nursing facility;Other (comment) (vs 24 hour care and HHOT)    Barriers to Discharge None    Equipment Recommendations  Defer to next venue    Recommendations for Other Services    Frequency  Min 2X/week    Precautions / Restrictions Precautions Precautions: Fall Restrictions Weight Bearing Restrictions: No   Pertinent Vitals/Pain     ADL  Eating/Feeding: Independent;Performed Where Assessed - Eating/Feeding: Chair Grooming: Performed;Wash/dry face;Wash/dry hands;Teeth care;Brushing hair;Set up Where Assessed - Grooming: Unsupported sitting Upper Body Bathing: Simulated;Minimal assistance Where Assessed - Upper Body Bathing: Unsupported sitting Lower Body Bathing: Simulated;Minimal assistance Where Assessed - Lower Body Bathing: Supported sit to stand Upper Body Dressing: Set up;Performed Where Assessed - Upper Body Dressing: Unsupported sitting Lower Body Dressing: Performed;Minimal assistance Where Assessed - Lower Body Dressing: Supported sit to stand Toilet Transfer: Minimal assistance;Simulated;Other (comment) (bed to chair) Toilet Transfer Method: Sit to stand Equipment Used: Gait belt Transfers/Ambulation Related to ADLs: Pt ambulated 4 ft with both hands held by OT and PT.  Pt did  not feel like she could walk any further. ADL Comments: Pt able to access her feet without AE seated EOB.  Unable to stand for grooming.  Motivated to begin use of 3 in1 instead of foley.    OT Diagnosis: Generalized weakness  OT Problem List: Decreased activity tolerance;Impaired balance (sitting and/or standing) OT Treatment Interventions: Self-care/ADL training;Patient/family education;Balance training   OT Goals Acute Rehab OT Goals OT Goal Formulation: With patient Time For Goal Achievement: 10/16/11 Potential to Achieve Goals: Good ADL Goals Pt Will Perform Grooming: with supervision;Standing at sink;Unsupported (3 activities) ADL Goal: Grooming - Progress: Goal set today Pt Will Perform Lower Body Bathing: with supervision;Sit to stand from bed ADL Goal: Lower Body Bathing - Progress: Goal set today Pt Will Perform Lower Body Dressing: with supervision;Sit to stand from bed ADL Goal: Lower Body Dressing - Progress: Goal set today Pt Will Transfer to Toilet: with supervision;Ambulation;3-in-1;with DME ADL Goal: Toilet Transfer - Progress: Goal set today Pt Will Perform Toileting - Clothing Manipulation: with modified independence;Standing ADL Goal: Toileting - Clothing Manipulation - Progress: Goal set today Pt Will Perform Toileting - Hygiene: Independently;Sit to stand from 3-in-1/toilet ADL Goal: Toileting - Hygiene - Progress: Goal set today  Visit Information  Last OT Received On: 09/02/11 Assistance Needed: +1 PT/OT Co-Evaluation/Treatment: Yes    Subjective Data  Subjective: "I don't want to go to a nursing home." Patient Stated Goal: Return to PLOF.   Prior Functioning  Home Living Lives With: Alone Available Help at Discharge: Available PRN/intermittently;Friend(s) Type of Home: House Home Access: Stairs to enter [employer] of Steps: 2 +1 Entrance Stairs-Rails: None Home Layout: One level;Laundry or work area in basement;Other (Comment) Bathroom  Shower/Tub: Walk-in Contractor: Standard Bathroom Accessibility: Yes How Accessible: Accessible via walker Home Adaptive Equipment: None Prior  Function Level of Independence: Independent Able to Take Stairs?: No Driving: Yes Vocation: On disability Communication Communication: No difficulties Dominant Hand: Left    Cognition  Overall Cognitive Status: Appears within functional limits for tasks assessed/performed Arousal/Alertness: Awake/alert Orientation Level: Appears intact for tasks assessed Behavior During Session: [facility] for tasks performed    Extremity/Trunk Assessment Right Upper Extremity Assessment RUE ROM/Strength/Tone: Within functional levels RUE Sensation: WFL - Light Touch;WFL - Proprioception RUE Coordination: WFL - gross/fine motor Left Upper Extremity Assessment LUE ROM/Strength/Tone: Within functional levels LUE Sensation: WFL - Light Touch;WFL - Proprioception LUE Coordination: WFL - gross/fine motor Right Lower Extremity Assessment RLE ROM/Strength/Tone: Deficits RLE ROM/Strength/Tone Deficits: Some generalized weakness noted with decr LE musculature endurance Left Lower Extremity Assessment LLE ROM/Strength/Tone: Deficits LLE ROM/Strength/Tone Deficits: Some generalized weakness noted with decr LE musculature endurance   Mobility Bed Mobility Bed Mobility: Supine to Sit Supine to Sit: 4: Min assist Details for Bed Mobility Assistance: Cues to initiate, and to complete task for fully upright sitting Transfers Transfers: Sit to Stand;Stand to Sit Sit to Stand: 4: Min assist;From bed Stand to Sit: 4: Min assist;With upper extremity assist;To chair/3-in-1 Details for Transfer Assistance: Cues for safety, technique, hand placement   Exercise    Balance Static Sitting Balance Static Sitting - Balance Support: No upper extremity supported;Feet supported Static Sitting - Level of Assistance: 4: Min assist;Other (comment) (noted to lose  balance to L when donning socks.)  End of Session OT - End of Session Activity Tolerance: Patient tolerated treatment well Patient left: in chair;with call bell/phone within reach Nurse Communication: Mobility status       Evern Bio 09/02/2011, 1:42 PM [phone]

## 2011-09-02 NOTE — Progress Notes (Signed)
Physical Therapy Evaluation Patient Details Name: [name] MRN: [id number] DOB: 1951-01-19 Today's Date: 09/02/2011 Time: 0940-1010 PT Time Calculation (min): 30 min  PT Assessment / Plan / Recommendation Clinical Impression  61 yo female admitted with Digoxin toxicity (in setting of significantly decr oral intake), recent hernia repair(s) present with decr functional mobility, in particular significantly decr endurance, limiting ability to safely manage at home;   Will benefit from acute PT to maximize independence and safety with moblity and activitty tolerance, and to facilitate dc planning;   Agree with short-term post-acute rehab to maximize functional moblity and medical compliance prio to dc home    PT Assessment  Patient needs continued PT services    Follow Up Recommendations  Skilled nursing facility;Supervision/Assistance - 24 hour    Barriers to Discharge Decreased caregiver support      Equipment Recommendations  Defer to next venue    Recommendations for Other Services     Frequency Min 3X/week    Precautions / Restrictions Precautions Precautions: Fall   Pertinent Vitals/Pain Abdominal pain/discomfort with moving; Repositioned as comfortably as possible in chair      Mobility  Bed Mobility Bed Mobility: Supine to Sit Supine to Sit: 4: Min assist Details for Bed Mobility Assistance: Cues to initiate, and to complete task for fully upright sitting Transfers Transfers: Sit to Stand;Stand to Sit Sit to Stand: 4: Min assist;From bed Stand to Sit: 4: Min assist;With upper extremity assist;To chair/3-in-1 Details for Transfer Assistance: Cues for safety, technique, hand placement Ambulation/Gait Ambulation/Gait Assistance: 4: Min assist Ambulation Distance (Feet): 4 Feet Assistive device: Rolling walker Ambulation/Gait Assistance Details: Slow moving, cues for upright posture, and directional cues to get to recliner; Noted significantly decr  endurance Gait Pattern: Decreased step length - right;Decreased step length - left Gait velocity: slow    Exercises     PT Diagnosis: Difficulty walking;Abnormality of gait;Generalized weakness;Acute pain  PT Problem List: Decreased activity tolerance;Pain;Decreased mobility PT Treatment Interventions: DME instruction;Gait training;Functional mobility training;Therapeutic activities;Therapeutic exercise;Balance training;Neuromuscular re-education;Patient/family education;Stair training   PT Goals Acute Rehab PT Goals PT Goal Formulation: With patient Time For Goal Achievement: 09/16/11 Potential to Achieve Goals: Good Pt will go Supine/Side to Sit: with modified independence;with rail PT Goal: Supine/Side to Sit - Progress: Goal set today Pt will go Sit to Supine/Side: with modified independence;with rail PT Goal: Sit to Supine/Side - Progress: Goal set today Pt will go Sit to Stand: with modified independence PT Goal: Sit to Stand - Progress: Goal set today Pt will go Stand to Sit: with modified independence PT Goal: Stand to Sit - Progress: Goal set today Pt will Ambulate: >150 feet;with modified independence;with least restrictive assistive device PT Goal: Ambulate - Progress: Goal set today Pt will Go Up / Down Stairs: 3-5 stairs;with min assist;with least restrictive assistive device PT Goal: Up/Down Stairs - Progress: Goal set today  Visit Information  Last PT Received On: 09/02/11 Assistance Needed: +1    Subjective Data  Subjective: Wants to be independent again Patient Stated Goal: home   Prior Functioning  Home Living Lives With: Alone Available Help at Discharge: Available PRN/intermittently;Friend(s) Type of Home: House Home Access: Stairs to enter [employer] of Steps: 2 +1 Entrance Stairs-Rails: None Home Layout: One level;Laundry or work area in basement;Other (Comment) Bathroom Shower/Tub: Walk-in Contractor:  Standard Bathroom Accessibility: Yes How Accessible: Accessible via walker Home Adaptive Equipment: None Prior Function Level of Independence: Independent Able to Take Stairs?: No Driving: Yes Vocation: On  disability Communication Communication: No difficulties Dominant Hand: Left    Cognition  Overall Cognitive Status: Appears within functional limits for tasks assessed/performed Arousal/Alertness: Awake/alert Orientation Level: Appears intact for tasks assessed Behavior During Session: [facility] for tasks performed    Extremity/Trunk Assessment Right Upper Extremity Assessment RUE ROM/Strength/Tone: Within functional levels Left Upper Extremity Assessment LUE ROM/Strength/Tone: Within functional levels Right Lower Extremity Assessment RLE ROM/Strength/Tone: Deficits RLE ROM/Strength/Tone Deficits: Some generalized weakness noted with decr LE musculature endurance Left Lower Extremity Assessment LLE ROM/Strength/Tone: Deficits LLE ROM/Strength/Tone Deficits: Some generalized weakness noted with decr LE musculature endurance   Balance    End of Session PT - End of Session Equipment Utilized During Treatment: Gait belt Activity Tolerance: Patient tolerated treatment well;Patient limited by fatigue Patient left: in chair;with call bell/phone within reach Nurse Communication: Mobility status  GP     Van Clines [facility] [place], [place] [phone]  09/02/2011, 11:51 AM

## 2011-09-03 DIAGNOSIS — F3289 Other specified depressive episodes: Secondary | ICD-10-CM

## 2011-09-03 DIAGNOSIS — F329 Major depressive disorder, single episode, unspecified: Secondary | ICD-10-CM

## 2011-09-03 LAB — BASIC METABOLIC PANEL
BUN: 15 mg/dL (ref 6–23)
Chloride: 105 mEq/L (ref 96–112)
GFR calc Af Amer: 71 mL/min — ABNORMAL LOW (ref >90)
Glucose, Bld: 129 mg/dL — ABNORMAL HIGH (ref 70–99)
Potassium: 3.7 mEq/L (ref 3.5–5.1)
Sodium: 138 mEq/L (ref 135–145)

## 2011-09-03 LAB — CBC
HCT: 30 % — ABNORMAL LOW (ref 36.0–46.0)
Hemoglobin: 10.1 g/dL — ABNORMAL LOW (ref 12.0–15.0)
MCH: 31 pg (ref 26.0–34.0)
MCHC: 33.7 g/dL (ref 30.0–36.0)
MCV: 92 fL (ref 78.0–100.0)
Platelets: 211 10*3/uL (ref 150–400)
RDW: 13.1 % (ref 11.5–15.5)
WBC: 9.3 10*3/uL (ref 4.0–10.5)

## 2011-09-03 LAB — GLUCOSE, CAPILLARY
Glucose-Capillary: 112 mg/dL — ABNORMAL HIGH (ref 70–99)
Glucose-Capillary: 100 mg/dL — ABNORMAL HIGH (ref 70–99)
Glucose-Capillary: 104 mg/dL — ABNORMAL HIGH (ref 70–99)

## 2011-09-03 MED ORDER — SODIUM CHLORIDE 0.9 % IV SOLN
INTRAVENOUS | Status: DC
  Administered 2011-09-03: 20 mL/h via INTRAVENOUS

## 2011-09-03 MED ORDER — WARFARIN SODIUM 1 MG PO TABS
1.0000 mg | ORAL_TABLET | Freq: Once | ORAL | Status: AC
  Administered 2011-09-03: 1 mg via ORAL
  Filled 2011-09-03: qty 1

## 2011-09-03 NOTE — Progress Notes (Addendum)
Clinical Social Work Department CLINICAL SOCIAL WORK PLACEMENT NOTE 09/03/2011  Patient:  [name], [name]  Account Number:  [id number] Admit date:  08/28/2011  Clinical Social Worker:  Margaree Mackintosh  Date/time:  09/03/2011 04:17 PM  Clinical Social Work is seeking post-discharge placement for this patient at the following level of care:   SKILLED NURSING   (*CSW will update this form in Epic as items are completed)   09/03/2011  Patient/family provided with [facility] Department of Clinical Social Work's list of facilities offering this level of care within the geographic area requested by the patient (or if unable, by the patient's family).  09/03/2011  Patient/family informed of their freedom to choose among providers that offer the needed level of care, that participate in Medicare, Medicaid or managed care program needed by the patient, have an available bed and are willing to accept the patient.  09/03/2011  Patient/family informed of [facility]' ownership interest in [facility], as well as of the fact that they are under no obligation to receive care at this facility.  PASARR submitted to EDS on 09/03/2011 PASARR number received from EDS on   FL2 transmitted to all facilities in geographic area requested by pt/family on  09/03/2011 FL2 transmitted to all facilities within larger geographic area on   Patient informed that his/her managed care company has contracts with or will negotiate with  certain facilities, including the following:     Patient/family informed of bed offers received: 09/04/11 Patient chooses bed at [facility] skilled nursing facility Physician recommends and patient chooses bed at    Patient to be transferred to [facility] on 09/04/11   Patient to be transferred to facility by family  The following physician request were entered in Epic:   Additional Comments: 09/04/11: Patient's son will transport her to skilled nursing facility. Alvina Chou, LCSW

## 2011-09-03 NOTE — Clinical Social Work Psychosocial (Signed)
     Clinical Social Work Department BRIEF PSYCHOSOCIAL ASSESSMENT 09/03/2011  Patient:  [name], [name]     Account Number:  [id number]     Admit date:  08/28/2011  Clinical Social Worker:  Margaree Mackintosh  Date/Time:  09/03/2011 04:14 PM  Referred by:  Physician  Date Referred:  09/03/2011 Referred for  SNF Placement   Other Referral:   Interview type:  Patient Other interview type:    PSYCHOSOCIAL DATA Living Status:  ALONE Admitted from facility:   Level of care:   Primary support name:  [phone] Primary support relationship to patient:  CHILD, ADULT Degree of support available:   Adequate.    CURRENT CONCERNS Current Concerns  Post-Acute Placement   Other Concerns:    SOCIAL WORK ASSESSMENT / PLAN Clinical Social Worker recieved referral to assess for potential SNF placement.  CSW reviewed chart and met with pt.  CSW introduced self, explained role, and provided support.  CSW provided opportunity for pt to process difficult feelings.  CSW validated pt's feelings related to hospitalization.  CSW reviewed current recommendations for SNF; pt agreeable.  CSW reviewed SNF placement process; pt expressed interest in [place] area.  CSW to submit appropriate paperwork for SNF review.   Assessment/plan status:  Information/Referral to [facility] Other assessment/ plan:   Information/referral to community resources:   SNF    PATIENTS/FAMILYS RESPONSE TO PLAN OF CARE: Pt was pleasant and apporpriately engaged.  Pt appeared to communicate openly and honestly.  Pt thanked CSW for intervention.

## 2011-09-03 NOTE — Progress Notes (Signed)
Physical Therapy Treatment Patient Details Name: [name] MRN: [id number] DOB: 1950/10/28 Today's Date: 09/03/2011 Time: 1510-1540 PT Time Calculation (min): 30 min  PT Assessment / Plan / Recommendation Comments on Treatment Session  Pt needed encouragement to participate but she did well getting up and walking.  pt wlaked longer distances today.  pt worried about how she is going to pay her 20% of her bills and this seems to be motivating her to move around more.    Pt encoursaged to walk again today with the nurse    Follow Up Recommendations  Skilled nursing facility    Barriers to Discharge        Equipment Recommendations  Defer to next venue    Recommendations for Other Services    Frequency Min 3X/week   Plan Discharge plan remains appropriate    Precautions / Restrictions Precautions Precautions: Fall   Pertinent Vitals/Pain O2 sats after ambulating was 92%    Mobility  Bed Mobility Supine to Sit: 5: Supervision;With rails Transfers Transfers: Sit to Stand Sit to Stand: 4: Min guard;With upper extremity assist;From bed Stand to Sit: 4: Min guard;With upper extremity assist;To chair/3-in-1 Ambulation/Gait Ambulation/Gait Assistance: 4: Min assist Ambulation Distance (Feet): 35 Feet Assistive device: Rolling walker Ambulation/Gait Assistance Details: pt needed cues to stand up tall and squeeze glu ts when walking.  Pt stopped and combed her hair while looking in the mirror    Exercises     PT Diagnosis:    PT Problem List:   PT Treatment Interventions:     PT Goals Acute Rehab PT Goals PT Goal: Rolling Supine to Right Side - Progress: Progressing toward goal PT Goal: Supine/Side to Sit - Progress: Progressing toward goal PT Goal: Sit to Stand - Progress: Progressing toward goal PT Goal: Stand to Sit - Progress: Progressing toward goal PT Goal: Ambulate - Progress: Progressing toward goal  Visit Information  Last PT Received On: 09/03/11 Assistance  Needed: +1    Subjective Data  Subjective: Im tired from getting up and going to the bathroom Patient Stated Goal: to get home   Cognition  Overall Cognitive Status: Appears within functional limits for tasks assessed/performed Arousal/Alertness: Awake/alert Orientation Level: Appears intact for tasks assessed Behavior During Session: [facility] for tasks performed    Balance     End of Session PT - End of Session Equipment Utilized During Treatment: Gait belt Activity Tolerance: Patient tolerated treatment well Patient left: in chair;with family/visitor present;with call bell/phone within reach   GP     Judson Roch 09/03/2011, 3:51 PM

## 2011-09-03 NOTE — Progress Notes (Signed)
Subjective: No acute events overnight, no chest pain  Objective: Vital signs in last 24 hours: Filed Vitals:   09/02/11 2212 09/03/11 0500 09/03/11 0551 09/03/11 1140  BP: 153/98  149/92 153/97  Pulse: 93  88 97  Temp: 100.8 F (38.2 C)  99.1 F (37.3 C) 99.8 F (37.7 C)  TempSrc: Oral  Oral Oral  Resp: 20  18 19   Height:      Weight:  201 lb 4.5 oz (91.3 kg)    SpO2: 94%  93%    Weight change: 10 lb 9.3 oz (4.8 kg)  Intake/Output Summary (Last 24 hours) at 09/03/11 1328 Last data filed at 09/03/11 1300  Gross per 24 hour  Intake   1320 ml  Output   1150 ml  Net    170 ml   BP 153/97  Pulse 97  Temp 99.8 F (37.7 C) (Oral)  Resp 19  Ht 5\' 3"  (1.6 m)  Wt 201 lb 4.5 oz (91.3 kg)  BMI 35.66 kg/m2  SpO2 93%  LMP 06/03/1978  Physical Exam: General: Well-developed, well-nourished, in no acute distress; eating fruit and french toast for breakfast Head: Normocephalic, atraumatic. Lungs: Normal respiratory effort. Clear to auscultation bilaterally from apices to bases without crackles or wheezes appreciated. Heart: irregular rhythm rate controlled, normal S1 and S2, no gallop, murmur, or rubs appreciated. Abdomen: Obese, BS normoactive. Soft, non-tender. No masses or organomegaly appreciated. Extremities: No pretibial edema, distal pulses intact Neurologic: grossly non-focal, alert and oriented x3 Psych: more conversant, cooperative with exam     Lab Results: Basic Metabolic Panel:  Lab 09/03/11 1610 09/02/11 0825 09/01/11 0525  NA 138 138 --  K 3.7 4.1 --  CL 105 108 --  CO2 22 18* --  GLUCOSE 129* 124* --  BUN 15 20 --  CREATININE 0.96 1.15* --  CALCIUM 9.0 9.1 --  MG -- -- --  PHOS -- -- 2.3   Liver Function Tests:  Lab 09/01/11 0525 08/29/11 0014  AST -- 13  ALT -- 16  ALKPHOS -- 83  BILITOT -- 0.3  PROT -- 6.8  ALBUMIN 2.8* 3.2*   CBC:  Lab 09/03/11 0842 09/02/11 0825 08/29/11 0014  WBC 9.3 10.2 --  NEUTROABS -- -- 6.0  HGB 10.1* 10.1* --    HCT 30.0* 29.4* --  MCV 92.0 92.2 --  PLT 211 209 --   Cardiac Enzymes:  Lab 09/01/11 2133 09/01/11 1304 09/01/11 0525  CKTOTAL 42 45 44  CKMB 3.5 3.9 3.5  CKMBINDEX -- -- --  TROPONINI 0.56* 0.69* 0.72*   CBG:  Lab 09/03/11 1217 09/03/11 0831 09/02/11 1642 09/02/11 1154 09/02/11 0809 09/01/11 2132  GLUCAP 112* 107* 132* 127* 116* 140*   Coagulation:  Lab 09/03/11 0535 09/02/11 0515 09/01/11 0525 08/31/11 0425  LABPROT 36.4* 39.6* 36.8* 29.4*  INR 3.59* 4.00* 3.64* 2.73*   Urinalysis:  Lab 08/31/11 1424 08/29/11 0256  COLORURINE AMBER* AMBER*  LABSPEC 1.020 1.020  PHURINE 5.0 5.0  GLUCOSEU NEGATIVE NEGATIVE  HGBUR NEGATIVE NEGATIVE  BILIRUBINUR MODERATE* MODERATE*  KETONESUR 15* 15*  PROTEINUR NEGATIVE NEGATIVE  UROBILINOGEN 0.2 0.2  NITRITE NEGATIVE NEGATIVE  LEUKOCYTESUR SMALL* TRACE*     Micro Results: Recent Results (from the past 240 hour(s))  MRSA PCR SCREENING     Status: Abnormal   Collection Time   08/29/11 10:00 AM      Component Value Range Status Comment   MRSA by PCR POSITIVE (*) NEGATIVE Final    Studies/Results: Dg Chest  2 View  09/02/2011  *RADIOLOGY REPORT*  Clinical Data: Decreased oxygen level saturations.  Mild fever.  CHEST - 2 VIEW  Comparison: 08/29/2011.  Findings: Bibasilar atelectasis and infiltrative densities have developed in the lower lobes since the prior study.  There is associated posterior costophrenic angle blunting consistent with small bilateral pleural effusions.  There is mild pulmonary vascular congestion pattern.  There is central peribronchial thickening. Ectasia and nonaneurysmal calcification of the thoracic aorta are seen.  There is osteopenic appearance of bones.  Changes of degenerative disc disease and degenerative spondylosis are present.  There is borderline cardiac silhouette enlargement.  IMPRESSION: Borderline cardiac silhouette enlargement.  Development of bibasilar atelectasis and infiltrative opacities in the  lower lobes.  Posterior costophrenic angle blunting is seen consistent with small bilateral pleural effusions.  There is mild vascular congestion pattern with central peribronchial thickening.  Findings may be associated with volume overload, congestive heart failure, pneumonia, or combination.  Original Report Authenticated By: Crawford Givens, M.D.   Dg Abd 2 Views  09/02/2011  *RADIOLOGY REPORT*  Clinical Data: History of hernia repair complicated by postoperative ileus.  Follow-up film.  ABDOMEN - 2 VIEW  Comparison: Abdominal radiograph 08/13/2011.  Findings: There is gas and stool scattered throughout the colon extending to at least the level of the proximal rectum (distal rectum is excluded from the lower margin of the images).  There are several nondilated loops of gas-filled small bowel scattered throughout the central abdomen, measuring up to approximate 2.9 cm in diameter.  The no definite air-fluid levels are appreciated.  No gross evidence of pneumoperitoneum. Surgical clips project over the right upper quadrant of the abdomen, consistent with prior cholecystectomy.  IMPRESSION: 1.  Nonspecific, nonobstructive bowel gas pattern, as above. 2.  No pneumoperitoneum. 3.  Status post cholecystectomy.  Original Report Authenticated By: Florencia Reasons, M.D.   Medications: I have reviewed the patient's current medications. Scheduled Meds:    . atenolol  25 mg Oral BID  . Chlorhexidine Gluconate Cloth  6 each Topical Q0600  . cyproheptadine  2 mg Oral Q6H  . diltiazem  180 mg Oral Daily  . docusate sodium  100 mg Oral BID  . feeding supplement  1 Container Oral Q24H  . FLUoxetine  40 mg Oral Daily  . insulin aspart  0-5 Units Subcutaneous QHS  . insulin aspart  0-9 Units Subcutaneous TID WC  . insulin glargine  5 Units Subcutaneous QHS  . levothyroxine  75 mcg Oral Q0600  . metoCLOPramide  5 mg Oral Q6H  . mirtazapine  15 mg Oral QHS  . mupirocin ointment  1 application Nasal BID  .  ondansetron  4 mg Oral Q12H   Or  . ondansetron (ZOFRAN) IV  4 mg Intravenous Q12H  . senna-docusate  1 tablet Oral QHS  . warfarin  1 mg Oral ONCE-1800  . Warfarin - Pharmacist Dosing Inpatient   Does not apply q1800   Continuous Infusions:    . sodium chloride 20 mL/hr (09/03/11 1324)  . DISCONTD: sodium chloride 75 mL/hr at 09/02/11 2250   PRN Meds:.acetaminophen, acetaminophen, albuterol, polyethylene glycol  Assessment/Plan: HD #5 for 61 yo female w/ Afib, diabetes mellitus, hypertension and depression admitted with digoxin toxicity secondary to acute renal insufficiency from inadequate oral intake  #1 Chest pain: resolved, in setting of elevated troponin and Afib with RVR, resolved quickly, troponin enzymes trending towards normal, CK-Mb remained wnl, appreciate Cardiology recs, CXR with mild vasc congestion -will defer diuretic for  now as pt is w/o sob or chest pain with clear lung findings -cont  Atenolol -cont Diltiazem -cont hold ACE-I given AKI -check CXR pa/lat to r/o infiltrate given mildly febrile and , mild hypoxia -OOB to chair -incentive spirometry to bedside  Cardiac Panel (last 3 results)  Basename 09/01/11 2133 09/01/11 1304 09/01/11 0525  CKTOTAL 42 45 44  CKMB 3.5 3.9 3.5  TROPONINI 0.56* 0.69* 0.72*  RELINDX RELATIVE INDEX IS INVALID RELATIVE INDEX IS INVALID RELATIVE INDEX IS INVALID   #2 Acute Renal Failure: resolved, returned to baseline after hydration and discontinuation of digoxin, Renal Service signed off -adequate urine output  -cont to monitor  Lab 09/03/11 0842 09/02/11 0825 09/01/11 0525 08/31/11 0817 08/30/11 0830  CREATININE 0.96 1.15* 1.61* 2.50* 4.95*    #3 Depression: stable, depressed for ~ a decade but no change in her long term dosing of Prozac 20 mg, [facility] at least 6-9 months ago, + grief from murder of ex-husband 2-3 years ago -cont Prozac increase to 40 mg qd - cont 15 mg qhs Remeron for  mood and appetite stimulation -cont cyproheptadine for appetite stimulation per Psych recommendation with monitoring for possible urinary retention or constipation  #4 Inadequate oral intake:eating breakfast this morning of fruit and french toast. Hopefully addition of scheduled zofran has decreased her reported nausea.  This is mostly secondary to depression.  -appreciate Nutrition Consult recs -cont schedule Zofran dosing -KVO IVF per Cards recs -cont [place] with cyproheptadine tx   Filed Weights   08/31/11 2049 09/01/11 2140 09/03/11 0500  Weight: 190 lb 7.6 oz (86.4 kg) 190 lb 11.2 oz (86.5 kg) 201 lb 4.5 oz (91.3 kg)    #5 Hypertension: Bp 147/88 this morning, may need increased dosing -cont atenolol 25 mg bid and diltiazem -cont hold lisinopril and digoxin  #6 Atrial Fibrillation :  Currently rate controlled, INR supra-therapeutic 3.59 , likely secondary to initiation of Remeron - continue coumadin for anticoagulation per Pharmacy -cont atenolol 25 mg bid -cont diltiazem   #7 Type 2 Diabetic Mellitus: A1c = 6.3 on 07/29/11. CBG's are well controlled.  - Continue  SSI  - CBG checks ACHS   CBG (last 3)   Basename 09/03/11 1217 09/03/11 0831 09/02/11 1642  GLUCAP 112* 107* 132*   #8 Hypotension: Resolved -cont to hold all antihypertensive medications, in setting of AKI  #9 Digoxin Toxicity : Resolved - cont telemetry monitoring  #10 Elevated AG: resolved   #11 Hyponatremia:  Resolved  Lab 09/03/11 0842 09/02/11 0825 09/01/11 0525 08/31/11 0817 08/30/11 0830  NA 138 138 140 142 138   #12 Hypothyroidism: Last TSH = 0.756 (wnl) on 07/31/11.  -Continue home dose levothyroxine.  #13 s/p ventral hernia repair (5/22): complicated by post op ileus and gastroparesis, Abd XR today w/o signs of ileus   #14 VTE ppx: Anticoagulation with coumadin -dosing per Pharmacy  #15 Disposition: anticipate SNF placement, pt aggreaable -consult Social Work pending    LOS: 5 days     [name] 09/03/2011, 1:28 PM

## 2011-09-03 NOTE — Progress Notes (Signed)
ANTICOAGULATION CONSULT NOTE - Follow Up Consult  Pharmacy Consult for Coumadin Indication: atrial fibrillation  Allergies  Allergen Reactions  . Gemfibrozil Swelling    REACTION: Angioedema  . Latex Other (See Comments)    Blisters where touched or applied  . Penicillins Hives    Will spread in patches all over the body.  . Adhesive (Tape) Other (See Comments)    Will blister skin where applied - do not use BAND-AIDS.  Marland Kitchen Codeine Hives    Will spread in patches all over the body.    Patient Measurements: Height: 5\' 3"  (160 cm) Weight: 201 lb 4.5 oz (91.3 kg) IBW/kg (Calculated) : 52.4  Heparin Dosing Weight:   Vital Signs: Temp: 99.1 F (37.3 C) (06/27 0551) Temp src: Oral (06/27 0551) BP: 149/92 mmHg (06/27 0551) Pulse Rate: 88  (06/27 0551)  Labs:  [name] 09/03/11 0535 09/02/11 0825 09/02/11 0515 09/01/11 2133 09/01/11 1304 09/01/11 0525  HGB -- 10.1* -- -- -- 11.1*  HCT -- 29.4* -- -- -- 32.5*  PLT -- 209 -- -- -- 235  APTT -- -- -- -- -- --  LABPROT 36.4* -- 39.6* -- -- 36.8*  INR 3.59* -- 4.00* -- -- 3.64*  HEPARINUNFRC -- -- -- -- -- --  CREATININE -- 1.15* -- -- -- 1.61*  CKTOTAL -- -- -- 42 45 44  CKMB -- -- -- 3.5 3.9 3.5  TROPONINI -- -- -- 0.56* 0.69* 0.72*    Estimated Creatinine Clearance: 53.8 ml/min (by C-G formula based on Cr of 1.15).  Assessment: 61yof on chronic Coumadin for Afib. Pharmacy now consulted to manage. INR (3.59) remains supratherapeutic despite patient remaining on home regimen (2.5mg  daily except 5mg  MWF). She has not received coumadin for the past 2 days due to increased INR. Noted pt has had deceased appetite. - H/H and Plts trending down - No significant bleeding reported  Goal of Therapy:  INR 2-3   Plan:  1. Small dose if coumadin today to prevent decrease in INR to subtherapeutic level. 2. Daily PT/INR  Talbert Cage [place] [phone] 09/03/2011,8:26 AM

## 2011-09-03 NOTE — Progress Notes (Signed)
SUBJECTIVE:  No pain. Sleeping flat.  Denies any SOB  PHYSICAL EXAM Filed Vitals:   09/02/11 1700 09/02/11 2212 09/03/11 0500 09/03/11 0551  BP: 164/90 153/98  149/92  Pulse: 74 93  88  Temp: 98.5 F (36.9 C) 100.8 F (38.2 C)  99.1 F (37.3 C)  TempSrc: Oral Oral  Oral  Resp: 20 20  18   Height:      Weight:   201 lb 4.5 oz (91.3 kg)   SpO2: 91% 94%  93%   General:  No distress Lungs:  Clear Heart:  Irregular Abdomen:  Positive bowel sounds, no rebound no guarding Extremities:  No edema.  LABS:  Results for orders placed during the hospital encounter of 08/29/11 (from the past 24 hour(s))  GLUCOSE, CAPILLARY     Status: Abnormal   Collection Time   09/02/11  8:09 AM      Component Value Range   Glucose-Capillary 116 (*) 70 - 99 mg/dL   Comment 1 Notify RN     Comment 2 Documented in Chart    BASIC METABOLIC PANEL     Status: Abnormal   Collection Time   09/02/11  8:25 AM      Component Value Range   Sodium 138  135 - 145 mEq/L   Potassium 4.1  3.5 - 5.1 mEq/L   Chloride 108  96 - 112 mEq/L   CO2 18 (*) 19 - 32 mEq/L   Glucose, Bld 124 (*) 70 - 99 mg/dL   BUN 20  6 - 23 mg/dL   Creatinine, Ser 1.61 (*) 0.50 - 1.10 mg/dL   Calcium 9.1  8.4 - 09.6 mg/dL   GFR calc non Af Amer 50 (*) >90 mL/min   GFR calc Af Amer 57 (*) >90 mL/min  CBC     Status: Abnormal   Collection Time   09/02/11  8:25 AM      Component Value Range   WBC 10.2  4.0 - 10.5 K/uL   RBC 3.19 (*) 3.87 - 5.11 MIL/uL   Hemoglobin 10.1 (*) 12.0 - 15.0 g/dL   HCT 04.5 (*) 40.9 - 81.1 %   MCV 92.2  78.0 - 100.0 fL   MCH 31.7  26.0 - 34.0 pg   MCHC 34.4  30.0 - 36.0 g/dL   RDW 91.4  78.2 - 95.6 %   Platelets 209  150 - 400 K/uL  GLUCOSE, CAPILLARY     Status: Abnormal   Collection Time   09/02/11 11:54 AM      Component Value Range   Glucose-Capillary 127 (*) 70 - 99 mg/dL   Comment 1 Notify RN     Comment 2 Documented in Chart    GLUCOSE, CAPILLARY     Status: Abnormal   Collection Time   09/02/11  4:42 PM      Component Value Range   Glucose-Capillary 132 (*) 70 - 99 mg/dL   Comment 1 Notify RN     Comment 2 Documented in Chart      Intake/Output Summary (Last 24 hours) at 09/03/11 0649 Last data filed at 09/03/11 0551  Gross per 24 hour  Intake   1380 ml  Output   1675 ml  Net   -295 ml    ASSESSMENT AND PLAN:   Atrial fibrillation:  Her heart rate control is good on current beta blocker and calcium channel blocker. Continue current.  Warfarin per pharmacy (held last two days with increased INR).  Precordial pain:  No objective evidence of ischemia.  Her mild troponin rise is nonspecific.  No further work up.   HTN:  Back on atenolol and Diltiazem.  Heart rate is tolerating this.  BP still slightly high.  We can adjust medications further based on future readings.  Holding ACE inhibitor.   Hypoxemia:  She had some low sats yesterday but is off the O2 currently.  No crackles on exam.  She denies SOB.  CXR with small bilateral effusions and mild vascular congestion.  I will defer the question of infection to the primary team.  There probably is a component of mild volume overload.  I will check a BNP.  KVO fluid.  Fayrene Fearing [facility] 09/03/2011 6:49 AM

## 2011-09-03 NOTE — Progress Notes (Addendum)
Internal Medicine Teaching Service Attending Note Date: 09/03/2011  Patient name: [name]  Medical record number: [id number]  Date of birth: 1950-11-08    This patient has been seen and discussed with the house staff. Please see their note for complete details. I concur with their findings with the following additions/corrections:  Patient is improved today with less pain, has had a bowel movement. She is in better spirits. Her Cr is now normal, HR controlled.  We will make sure that she is working with PT and is using incentive spirometry. She has some vascular congestion on CXR, pleural effusions atelectasis (vs infilltrate). I dont see strong call for PNA at this point. ? She may need diuresis?  Dr. Antoine Poche is following very closely from Cardiology.  Paulette Blanch Dam 09/03/2011, 12:32 PM   NOTE THE PATIENT WAS FOUND TO MEET CRITERIA FOR SEVERE MALNUTRITION DUE TO HER POOR ORAL INTAKE, AND RECENT MULTIPLE MEDICAL ILLNESSES.

## 2011-09-04 DIAGNOSIS — F3289 Other specified depressive episodes: Secondary | ICD-10-CM | POA: Diagnosis not present

## 2011-09-04 DIAGNOSIS — J4489 Other specified chronic obstructive pulmonary disease: Secondary | ICD-10-CM | POA: Diagnosis not present

## 2011-09-04 DIAGNOSIS — R079 Chest pain, unspecified: Secondary | ICD-10-CM | POA: Diagnosis not present

## 2011-09-04 DIAGNOSIS — E1142 Type 2 diabetes mellitus with diabetic polyneuropathy: Secondary | ICD-10-CM | POA: Diagnosis not present

## 2011-09-04 DIAGNOSIS — I6529 Occlusion and stenosis of unspecified carotid artery: Secondary | ICD-10-CM | POA: Diagnosis not present

## 2011-09-04 DIAGNOSIS — R072 Precordial pain: Secondary | ICD-10-CM | POA: Diagnosis not present

## 2011-09-04 DIAGNOSIS — T50995A Adverse effect of other drugs, medicaments and biological substances, initial encounter: Secondary | ICD-10-CM | POA: Diagnosis not present

## 2011-09-04 DIAGNOSIS — N289 Disorder of kidney and ureter, unspecified: Secondary | ICD-10-CM | POA: Diagnosis not present

## 2011-09-04 DIAGNOSIS — I10 Essential (primary) hypertension: Secondary | ICD-10-CM | POA: Diagnosis not present

## 2011-09-04 DIAGNOSIS — K219 Gastro-esophageal reflux disease without esophagitis: Secondary | ICD-10-CM | POA: Diagnosis not present

## 2011-09-04 DIAGNOSIS — F329 Major depressive disorder, single episode, unspecified: Secondary | ICD-10-CM | POA: Diagnosis not present

## 2011-09-04 DIAGNOSIS — I4891 Unspecified atrial fibrillation: Secondary | ICD-10-CM | POA: Diagnosis not present

## 2011-09-04 DIAGNOSIS — I498 Other specified cardiac arrhythmias: Secondary | ICD-10-CM | POA: Diagnosis not present

## 2011-09-04 DIAGNOSIS — N179 Acute kidney failure, unspecified: Secondary | ICD-10-CM | POA: Diagnosis not present

## 2011-09-04 DIAGNOSIS — R630 Anorexia: Secondary | ICD-10-CM | POA: Diagnosis not present

## 2011-09-04 DIAGNOSIS — J449 Chronic obstructive pulmonary disease, unspecified: Secondary | ICD-10-CM | POA: Diagnosis not present

## 2011-09-04 DIAGNOSIS — I9589 Other hypotension: Secondary | ICD-10-CM | POA: Diagnosis not present

## 2011-09-04 DIAGNOSIS — E1149 Type 2 diabetes mellitus with other diabetic neurological complication: Secondary | ICD-10-CM | POA: Diagnosis not present

## 2011-09-04 DIAGNOSIS — IMO0001 Reserved for inherently not codable concepts without codable children: Secondary | ICD-10-CM | POA: Diagnosis not present

## 2011-09-04 DIAGNOSIS — E039 Hypothyroidism, unspecified: Secondary | ICD-10-CM | POA: Diagnosis not present

## 2011-09-04 DIAGNOSIS — M899 Disorder of bone, unspecified: Secondary | ICD-10-CM | POA: Diagnosis not present

## 2011-09-04 DIAGNOSIS — E785 Hyperlipidemia, unspecified: Secondary | ICD-10-CM | POA: Diagnosis not present

## 2011-09-04 DIAGNOSIS — E669 Obesity, unspecified: Secondary | ICD-10-CM | POA: Diagnosis not present

## 2011-09-04 DIAGNOSIS — E119 Type 2 diabetes mellitus without complications: Secondary | ICD-10-CM | POA: Diagnosis not present

## 2011-09-04 DIAGNOSIS — M1990 Unspecified osteoarthritis, unspecified site: Secondary | ICD-10-CM | POA: Diagnosis not present

## 2011-09-04 LAB — PROTIME-INR
INR: 3.01 — ABNORMAL HIGH (ref 0.00–1.49)
Prothrombin Time: 31.7 seconds — ABNORMAL HIGH (ref 11.6–15.2)

## 2011-09-04 LAB — GLUCOSE, CAPILLARY
Glucose-Capillary: 109 mg/dL — ABNORMAL HIGH (ref 70–99)
Glucose-Capillary: 110 mg/dL — ABNORMAL HIGH (ref 70–99)

## 2011-09-04 MED ORDER — MIRTAZAPINE 15 MG PO TABS: 15.0000 mg | ORAL_TABLET | Freq: Every day | ORAL | Status: DC

## 2011-09-04 MED ORDER — WARFARIN SODIUM 1 MG PO TABS
1.0000 mg | ORAL_TABLET | Freq: Once | ORAL | Status: DC
  Filled 2011-09-04: qty 1

## 2011-09-04 MED ORDER — FLUOXETINE HCL 20 MG PO CAPS: 40.0000 mg | ORAL_CAPSULE | Freq: Every day | ORAL | Status: DC

## 2011-09-04 MED ORDER — METOCLOPRAMIDE HCL 5 MG PO TABS: 5.0000 mg | ORAL_TABLET | Freq: Four times a day (QID) | ORAL | Status: DC

## 2011-09-04 MED ORDER — SENNOSIDES-DOCUSATE SODIUM 8.6-50 MG PO TABS: 1.0000 | ORAL_TABLET | Freq: Every day | ORAL | Status: DC

## 2011-09-04 MED ORDER — CYPROHEPTADINE HCL 4 MG PO TABS: 2.0000 mg | ORAL_TABLET | Freq: Four times a day (QID) | ORAL | Status: DC

## 2011-09-04 NOTE — Progress Notes (Addendum)
Subjective: No acute events overnight  Objective: Vital signs in last 24 hours: Filed Vitals:   09/03/11 1400 09/03/11 1546 09/03/11 2136 09/04/11 0500  BP: 123/89  163/92 156/103  Pulse: 103  104 93  Temp: 99 F (37.2 C)  99.3 F (37.4 C) 98.8 F (37.1 C)  TempSrc: Oral  Oral Oral  Resp: 20  18 20   Height:      Weight:   198 lb 10.2 oz (90.1 kg)   SpO2: 94% 92% 93% 98%   Weight change: -2 lb 10.3 oz (-1.2 kg)  Intake/Output Summary (Last 24 hours) at 09/04/11 0853 Last data filed at 09/04/11 0500  Gross per 24 hour  Intake 1365.67 ml  Output      0 ml  Net 1365.67 ml   BP 156/103  Pulse 93  Temp 98.8 F (37.1 C) (Oral)  Resp 20  Ht 5\' 3"  (1.6 m)  Wt 198 lb 10.2 oz (90.1 kg)  BMI 35.19 kg/m2  SpO2 98%  LMP 06/03/1978  Physical Exam: General: Well-developed, well-nourished, in no acute distress;  Head: Normocephalic, atraumatic. Lungs: Normal respiratory effort. Clear to auscultation bilaterally from apices to bases without crackles or wheezes appreciated. Heart: irregular rhythm rate controlled, normal S1 and S2, no gallop, murmur, or rubs appreciated. Abdomen: Obese, BS normoactive. Soft, non-tender. No masses or organomegaly appreciated. Extremities: No pretibial edema, distal pulses intact Neurologic: grossly non-focal, alert and oriented x3 Psych: more conversant, cooperative with exam     Lab Results: Basic Metabolic Panel:  Lab 09/03/11 1308 09/02/11 0825 09/01/11 0525  NA 138 138 --  K 3.7 4.1 --  CL 105 108 --  CO2 22 18* --  GLUCOSE 129* 124* --  BUN 15 20 --  CREATININE 0.96 1.15* --  CALCIUM 9.0 9.1 --  MG -- -- --  PHOS -- -- 2.3   Liver Function Tests:  Lab 09/01/11 0525 08/29/11 0014  AST -- 13  ALT -- 16  ALKPHOS -- 83  BILITOT -- 0.3  PROT -- 6.8  ALBUMIN 2.8* 3.2*   CBC:  Lab 09/03/11 0842 09/02/11 0825 08/29/11 0014  WBC 9.3 10.2 --  NEUTROABS -- -- 6.0  HGB 10.1* 10.1* --  HCT 30.0* 29.4* --  MCV 92.0 92.2 --  PLT  211 209 --   Cardiac Enzymes:  Lab 09/01/11 2133 09/01/11 1304 09/01/11 0525  CKTOTAL 42 45 44  CKMB 3.5 3.9 3.5  CKMBINDEX -- -- --  TROPONINI 0.56* 0.69* 0.72*   CBG:  Lab 09/04/11 0748 09/03/11 2137 09/03/11 1645 09/03/11 1217 09/03/11 0831 09/02/11 1642  GLUCAP 109* 104* 100* 112* 107* 132*   Coagulation:  Lab 09/04/11 0615 09/03/11 0535 09/02/11 0515 09/01/11 0525  LABPROT 31.7* 36.4* 39.6* 36.8*  INR 3.01* 3.59* 4.00* 3.64*   Urinalysis:  Lab 08/31/11 1424 08/29/11 0256  COLORURINE AMBER* AMBER*  LABSPEC 1.020 1.020  PHURINE 5.0 5.0  GLUCOSEU NEGATIVE NEGATIVE  HGBUR NEGATIVE NEGATIVE  BILIRUBINUR MODERATE* MODERATE*  KETONESUR 15* 15*  PROTEINUR NEGATIVE NEGATIVE  UROBILINOGEN 0.2 0.2  NITRITE NEGATIVE NEGATIVE  LEUKOCYTESUR SMALL* TRACE*     Micro Results: Recent Results (from the past 240 hour(s))  MRSA PCR SCREENING     Status: Abnormal   Collection Time   08/29/11 10:00 AM      Component Value Range Status Comment   MRSA by PCR POSITIVE (*) NEGATIVE Final    Studies/Results: Dg Chest 2 View  09/02/2011  *RADIOLOGY REPORT*  Clinical Data: Decreased oxygen  level saturations.  Mild fever.  CHEST - 2 VIEW  Comparison: 08/29/2011.  Findings: Bibasilar atelectasis and infiltrative densities have developed in the lower lobes since the prior study.  There is associated posterior costophrenic angle blunting consistent with small bilateral pleural effusions.  There is mild pulmonary vascular congestion pattern.  There is central peribronchial thickening. Ectasia and nonaneurysmal calcification of the thoracic aorta are seen.  There is osteopenic appearance of bones.  Changes of degenerative disc disease and degenerative spondylosis are present.  There is borderline cardiac silhouette enlargement.  IMPRESSION: Borderline cardiac silhouette enlargement.  Development of bibasilar atelectasis and infiltrative opacities in the lower lobes.  Posterior costophrenic angle  blunting is seen consistent with small bilateral pleural effusions.  There is mild vascular congestion pattern with central peribronchial thickening.  Findings may be associated with volume overload, congestive heart failure, pneumonia, or combination.  Original Report Authenticated By: Crawford Givens, M.D.   Dg Abd 2 Views  09/02/2011  *RADIOLOGY REPORT*  Clinical Data: History of hernia repair complicated by postoperative ileus.  Follow-up film.  ABDOMEN - 2 VIEW  Comparison: Abdominal radiograph 08/13/2011.  Findings: There is gas and stool scattered throughout the colon extending to at least the level of the proximal rectum (distal rectum is excluded from the lower margin of the images).  There are several nondilated loops of gas-filled small bowel scattered throughout the central abdomen, measuring up to approximate 2.9 cm in diameter.  The no definite air-fluid levels are appreciated.  No gross evidence of pneumoperitoneum. Surgical clips project over the right upper quadrant of the abdomen, consistent with prior cholecystectomy.  IMPRESSION: 1.  Nonspecific, nonobstructive bowel gas pattern, as above. 2.  No pneumoperitoneum. 3.  Status post cholecystectomy.  Original Report Authenticated By: Florencia Reasons, M.D.   Medications: I have reviewed the patient's current medications. Scheduled Meds:    . atenolol  25 mg Oral BID  . cyproheptadine  2 mg Oral Q6H  . diltiazem  180 mg Oral Daily  . docusate sodium  100 mg Oral BID  . feeding supplement  1 Container Oral Q24H  . FLUoxetine  40 mg Oral Daily  . insulin aspart  0-5 Units Subcutaneous QHS  . insulin aspart  0-9 Units Subcutaneous TID WC  . insulin glargine  5 Units Subcutaneous QHS  . levothyroxine  75 mcg Oral Q0600  . metoCLOPramide  5 mg Oral Q6H  . mirtazapine  15 mg Oral QHS  . ondansetron  4 mg Oral Q12H   Or  . ondansetron (ZOFRAN) IV  4 mg Intravenous Q12H  . senna-docusate  1 tablet Oral QHS  . warfarin  1 mg Oral  ONCE-1800  . Warfarin - Pharmacist Dosing Inpatient   Does not apply q1800   Continuous Infusions:    . sodium chloride 20 mL/hr (09/03/11 1324)   PRN Meds:.acetaminophen, acetaminophen, albuterol, polyethylene glycol  Assessment/Plan: HD #6 for 61 yo female w/ Afib, diabetes mellitus, hypertension and depression admitted with digoxin toxicity secondary to acute renal insufficiency from inadequate oral intake  #1 Chest pain: resolved, in setting of elevated troponin and Afib with RVR, resolved quickly, troponin enzymes trending towards normal, CK-Mb remained wnl, appreciate Cardiology recs, CXR with mild vasc congestion -cont  Atenolol -cont Diltiazem -cont hold ACE-I given AKI -cont OOB to chair and ambulation with nursing -cont incentive spirometry to bedside  Cardiac Panel (last 3 results)  [facility] 09/01/11 2133 09/01/11 1304  CKTOTAL 42 45  CKMB 3.5 3.9  TROPONINI  0.56* 0.69*  RELINDX RELATIVE INDEX IS INVALID RELATIVE INDEX IS INVALID   #2 Acute Renal Failure: resolved, returned to baseline after hydration and discontinuation of digoxin, Renal Service signed off -adequate urine output  -cont to monitor  Lab 09/03/11 0842 09/02/11 0825 09/01/11 0525 08/31/11 0817 08/30/11 0830  CREATININE 0.96 1.15* 1.61* 2.50* 4.95*    #3 Depression: stable -cont Prozac increase to 40 mg qd - cont 15 mg qhs Remeron for mood and appetite stimulation -cont cyproheptadine for appetite stimulation per Psych recommendation with monitoring for possible urinary retention or constipation  #4 Inadequate oral intake:  mostly secondary to depression.  -appreciate Nutrition Consult recs -cont schedule Zofran dosing -KVO IVF per Cards recs -cont [place] with cyproheptadine tx   Filed Weights   09/01/11 2140 09/03/11 0500 09/03/11 2136  Weight: 190 lb 11.2 oz (86.5 kg) 201 lb 4.5 oz (91.3 kg) 198 lb 10.2 oz (90.1 kg)    #5 Hypertension: elevated this am this morning, may need increased  dosing -cont atenolol 25 mg bid and diltiazem -cont hold lisinopril and digoxin  #6 Atrial Fibrillation :  Currently rate controlled, INR supra-therapeutic 3.01, likely secondary to initiation of Remeron - continue coumadin for anticoagulation per Pharmacy -cont atenolol 25 mg bid -cont diltiazem   #7 Type 2 Diabetic Mellitus: A1c = 6.3 on 07/29/11. CBG's are well controlled.  - Continue  SSI  - CBG checks ACHS   CBG (last 3)   Basename 09/04/11 0748 09/03/11 2137 09/03/11 1645  GLUCAP 109* 104* 100*   #8 Hypotension: Resolved  #9 Digoxin Toxicity : Resolved - cont telemetry monitoring  #10 Elevated AG: resolved  #11 Hyponatremia:  Resolved  Lab 09/03/11 0842 09/02/11 0825 09/01/11 0525 08/31/11 0817 08/30/11 0830  NA 138 138 140 142 138   #12 Hypothyroidism: Last TSH = 0.756 (wnl) on 07/31/11.  -Continue home dose levothyroxine.  #13 s/p ventral hernia repair (5/22): complicated by post op ileus and gastroparesis, Abd XR w/o signs of ileus   #14 VTE ppx: Anticoagulation with coumadin -dosing per Pharmacy  #15 Disposition: anticipate SNF placement, pt agreeable -Social Work consulted    LOS: 6 days   Kristie Cowman 09/04/2011, 8:53 AM  Internal Medicine Teaching Service Attending Note Date: 09/04/2011  Patient name: NEAL OSHEA  Medical record number: [id number]  Date of birth: 1950/11/10    This patient has been seen and discussed with the house staff. Please see their note for complete details. I concur with their findings with the following additions/corrections: Patient doing well. I am hoping we can dc to SNF so that she can rehab further. She is using incentive spirometer now diligently she claims. She slept poorly last night due to anxiety. I counselled that we would need her to have good sleep hygeine and circadian R for improvement as well.  Acey Lav 09/04/2011, 11:26 AM

## 2011-09-04 NOTE — Clinical Social Work Note (Addendum)
Patient is medically stable for discharge today and will go to [facility] skilled nursing facility for short-term rehab. Discharge information forwarded to facility. Patient will be transported to SNF by her son.  Patient received PASARR number: [id number]  Genelle Bal, MSW, LCSW [phone]

## 2011-09-04 NOTE — Discharge Summary (Signed)
Internal Medicine Teaching [facility] Discharge Note  Name: [name] MRN: [id number] DOB: 1950-11-07 61 y.o.  Date of Admission: 08/29/2011 12:09 AM Date of Discharge: 09/04/2011 Attending Physician: Randall Hiss, MD  Discharge Diagnosis: Principal Problem:  *Digoxin toxicity Active Problems:  Atrial fibrillation  Acute renal insufficiency  Poor appetite  HYPOTHYROIDISM  HYPERTENSION  Bradycardia, drug induced  Drug-induced hypotension  Precordial pain   Discharge Medications: Medication List  As of 09/04/2011 11:17 AM   ASK your doctor about these medications         albuterol 108 (90 BASE) MCG/ACT inhaler   Commonly known as: PROVENTIL HFA;VENTOLIN HFA   Inhale 2 puffs into the lungs every 6 (six) hours as needed. For shortness of breath      ALPRAZolam 0.5 MG tablet   Commonly known as: XANAX   Take 1 tablet (0.5 mg total) by mouth at bedtime as needed for sleep or anxiety.      amitriptyline 50 MG tablet   Commonly known as: ELAVIL   Take 50 mg by mouth at bedtime.      atenolol 25 MG tablet   Commonly known as: TENORMIN   Take 25 mg by mouth 2 (two) times daily.      Calcium 600+D 600-400 MG-UNIT per tablet   Generic drug: Calcium Carbonate-Vitamin D   Take 1 tablet by mouth 2 (two) times daily with a meal.      cyclobenzaprine 5 MG tablet   Commonly known as: FLEXERIL   Take 5 mg by mouth 2 (two) times daily as needed. For fibromyalgia      digoxin 0.25 MG tablet   Commonly known as: LANOXIN   Take 250 mcg by mouth daily.      diltiazem 180 MG 24 hr capsule   Commonly known as: DILACOR XR   Take 180 mg by mouth daily.      enoxaparin 120 MG/0.8ML injection   Commonly known as: LOVENOX   Inject 0.8 mLs (120 mg total) into the skin daily. Take as directed by coumadin clinic, inject daily pre op briding as directed      LOVENOX 150 MG/ML injection   Generic drug: enoxaparin   Inject 0.8 mLs (120 mg total) into the skin daily.     fish oil-omega-3 fatty acids 1000 MG capsule   Take 1 g by mouth 2 (two) times daily.      FLUoxetine 20 MG capsule   Commonly known as: PROZAC   Take 20 mg by mouth daily.      furosemide 40 MG tablet   Commonly known as: LASIX   Take 40 mg by mouth daily.      gabapentin 100 MG capsule   Commonly known as: NEURONTIN   Take 100 mg by mouth 2 (two) times daily.      glipiZIDE 10 MG tablet   Commonly known as: GLUCOTROL   Take 10 mg by mouth 2 (two) times daily before a meal.      HYDROcodone-acetaminophen 5-500 MG per tablet   Commonly known as: VICODIN   Take 1 tablet by mouth every 12 (twelve) hours as needed. For pain      ketorolac 0.4 % Soln   Commonly known as: ACULAR LS   Apply 1 drop to eye 4 (four) times daily.      levothyroxine 75 MCG tablet   Commonly known as: SYNTHROID, LEVOTHROID   Take 1 tablet (75 mcg total) by mouth daily.  lisinopril 10 MG tablet   Commonly known as: PRINIVIL,ZESTRIL   Take 1 tablet (10 mg total) by mouth daily.      metFORMIN 1000 MG tablet   Commonly known as: GLUCOPHAGE   Take 1 tablet (1,000 mg total) by mouth 2 (two) times daily with a meal.      metoCLOPramide 5 MG tablet   Commonly known as: REGLAN   Take 1 tablet (5 mg total) by mouth 4 (four) times daily.      moxifloxacin 0.5 % ophthalmic solution   Commonly known as: VIGAMOX   Apply 1 drop to eye 3 (three) times daily.      multivitamin capsule   Take 1 capsule by mouth daily.      niacin 500 MG CR tablet   Commonly known as: NIASPAN   Take 2 tablets (1,000 mg total) by mouth 2 (two) times daily.      omeprazole 20 MG capsule   Commonly known as: PRILOSEC   Take 40 mg by mouth as needed. For acid reflux      potassium chloride 10 MEQ tablet   Commonly known as: K-DUR   Take 10 mEq by mouth daily.      pravastatin 80 MG tablet   Commonly known as: PRAVACHOL   Take 80 mg by mouth daily.      promethazine 25 MG tablet   Commonly known as: PHENERGAN    Take 25 mg by mouth every 6 (six) hours as needed.      SUPER B-50 B COMPLEX PO   Take 1 tablet by mouth 2 (two) times daily.      warfarin 5 MG tablet   Commonly known as: COUMADIN   Take 2.5-5 mg by mouth daily. Takes 5 mg on Monday Wednesday and Friday. Takes 2.5mg  on Tuesday, Thursday, Saturday, and Sunday.            Disposition and follow-up:   [name] was discharged from [facility] in Stable condition.  At the hospital follow up visit please address appetite and mood.  She should also have a follow-up creatinine level.  Follow-up Appointments:  Discharge Orders    Future Appointments: Provider: Department: Dept Phone: Center:   09/07/2011 2:00 PM [facility] [phone] None   10/02/2011 10:15 AM Annett Gula, MD [facility] [phone] [facility]   10/05/2011 9:30 AM Ernestene Mention, MD [facility] [phone] None      Consultations: Treatment Team:  Maree Krabbe, MD Lewayne Bunting, MD (Nephrology Service) Nutrition Consult Cardiology Service Pharmacy Consult Physical/Occupational Therapy  Procedures Performed:  Dg Chest 2 View  09/02/2011  *RADIOLOGY REPORT*  Clinical Data: Decreased oxygen level saturations.  Mild fever.  CHEST - 2 VIEW  Comparison: 08/29/2011.  Findings: Bibasilar atelectasis and infiltrative densities have developed in the lower lobes since the prior study.  There is associated posterior costophrenic angle blunting consistent with small bilateral pleural effusions.  There is mild pulmonary vascular congestion pattern.  There is central peribronchial thickening. Ectasia and nonaneurysmal calcification of the thoracic aorta are seen.  There is osteopenic appearance of bones.  Changes of degenerative disc disease and degenerative spondylosis are present.  There is borderline cardiac silhouette enlargement.  IMPRESSION: Borderline cardiac silhouette enlargement.  Development of  bibasilar atelectasis and infiltrative opacities in the lower lobes.  Posterior costophrenic angle blunting is seen consistent with small bilateral pleural effusions.  There is mild vascular congestion pattern  with central peribronchial thickening.  Findings may be associated with volume overload, congestive heart failure, pneumonia, or combination.  Original Report Authenticated By: Crawford Givens, M.D.   Dg Chest 2 View  08/29/2011  *RADIOLOGY REPORT*  Clinical Data: Weakness and lethargy.  History of diabetes.  CHEST - 2 VIEW  Comparison: Chest radiograph performed 08/06/2011  Findings: The lungs are well-aerated and clear.  There is no evidence of focal opacification, pleural effusion or pneumothorax.  The heart is borderline normal in size; calcification is noted within the aortic arch.  No acute osseous abnormalities are seen. Clips are noted within the right upper quadrant, reflecting prior cholecystectomy.  IMPRESSION: No acute cardiopulmonary process seen.  Original Report Authenticated By: Tonia Ghent, M.D.   Dg Chest 2 View  08/06/2011  *RADIOLOGY REPORT*  Clinical Data: Postop hernia repair 1 week ago, upper abdominal pain  CHEST - 2 VIEW  Comparison: Portable chest x-ray of 07/31/2011  Findings: Linear atelectasis remains at the left lung base. Otherwise the lungs appear clear.  There is some elevation of left hemidiaphragm which appears to be due to significant gaseous distention of the stomach.  Is there any suspicion of gastric outlet abnormality?  The heart is within normal limits in size.  IMPRESSION:  1.  Persistent linear atelectasis at the left lung base. 2.  Gaseous distention of the stomach with elevation of the left hemidiaphragm.  Consider gastric outlet obstruction.  Original Report Authenticated By: Juline Patch, M.D.   Dg Abd 1 View  08/13/2011  *RADIOLOGY REPORT*  Clinical Data: Abdominal tenderness.  Follow-up ileus.  ABDOMEN - 1 VIEW  Comparison: 08/11/2011.  Findings: Oral  contrast is seen throughout the colon. Mild residual dilatation of small bowel.  Overall pattern appears somewhat improved from 08/11/2011.  Nasogastric tube is partially imaged in the stomach, which is less distended.  IMPRESSION: Improving ileus.  Original Report Authenticated By: Reyes Ivan, M.D.   Ct Head Wo Contrast  08/29/2011  *RADIOLOGY REPORT*  Clinical Data: Weakness and slurred speech.  Hypotension.  CT HEAD WITHOUT CONTRAST  Technique:  Contiguous axial images were obtained from the base of the skull through the vertex without contrast.  Comparison: CT of the head performed 12/18/2007  Findings: There is no evidence of acute infarction, mass lesion, or intra- or extra-axial hemorrhage on CT.  Minimal beam-hardening artifact is noted at the high left frontoparietal region.  The posterior fossa, including the cerebellum, brainstem and fourth ventricle, is within normal limits.  The third and lateral ventricles, and basal ganglia are unremarkable in appearance.  The cerebral hemispheres are symmetric in appearance, with normal gray- white differentiation.  No mass effect or midline shift is seen.  There is no evidence of fracture; visualized osseous structures are unremarkable in appearance.  The orbits are within normal limits. The paranasal sinuses and mastoid air cells are well-aerated.  No significant soft tissue abnormalities are seen.  IMPRESSION: Unremarkable noncontrast CT of the head.  Original Report Authenticated By: Tonia Ghent, M.D.   Ct Abdomen Pelvis W Contrast  08/06/2011  *RADIOLOGY REPORT*  Clinical Data: Evaluate recurrent postop distention.  Epigastric pain.  Recent ventral laparoscopic hernia repair.  Surgery 07/29/2011.  CT ABDOMEN AND PELVIS WITH CONTRAST  Technique:  Multidetector CT imaging of the abdomen and pelvis was performed following the standard protocol during bolus administration of intravenous contrast.  Contrast: OMNIPAQUE IOHEXOL 300 MG/ML  SOLN   Comparison: Plain film of 08/06/2011.  CT of 06/08/2011.  Findings:  Subsegmental atelectasis at the lung bases, greater left than right.  Mild cardiomegaly, without pericardial or pleural effusion.  Trace perihepatic ascites.  Mild hepatic steatosis.  Mild hepatomegaly, 19.6 cm cranial caudal.  Normal spleen, stomach, pancreas. Cholecystectomy without biliary ductal dilatation.  Normal adrenal glands.  Too small to characterize lesions within bilateral kidneys. No retroperitoneal or retrocrural adenopathy.  The colon is normal in caliber. Scattered colonic diverticula. Normal terminal ileum and appendix.  The duodenum and proximal jejunum are dilated, up to 3.9 cm.  The small bowel undergoes a relatively gradual transition to normal to decompressed mid and distal small bowel.  No obstructive mass identified.  No pneumatosis or other signs of small bowel ischemia. No free intraperitoneal air.  No ascites.  Trace right pelvic fluid image 76 is new.  Bilateral fat containing inguinal hernias. No pelvic adenopathy.  Normal urinary bladder. Hysterectomy.  No adnexal mass.  Left anterior pelvic wall laxity versus a fat-containing spigelian hernia.  Image 71.  Postoperative edema within the subcutaneous fat of the anterior abdomen.  Minimal fluid within the subcutaneous fat on image 47 which is likely postoperative.  4.9 x 1.2 cm.  No surrounding enhancement. No acute osseous abnormality.    Disc bulges at L5 disc bulges at L4-L5 and L5-S1.  IMPRESSION:  1.  Proximal small bowel dilatation, with a relatively gradual transition to normal to decompressed mid to distal small bowel. Favor focal postoperative adynamic ileus.  A low grade partial small bowel obstruction is felt less likely.  No evidence of ischemia or other acute complication. 2.  Trace perihepatic and right-sided pelvic ascites, likely postoperative. 3.  Minimal fluid in the anterior abdominal wall, likely postoperative.  No significant surrounding enhancement  or inflammation to suggest complicating infection.  Original Report Authenticated By: Consuello Bossier, M.D.   US Renal  08/30/2011  *RADIOLOGY REPORT*  Clinical Data: Acute renal failure.  History of hypertension and diabetes.  RENAL/URINARY TRACT ULTRASOUND COMPLETE  Comparison:  CT abdomen and pelvis 08/06/2011.  Findings:  Right Kidney:  No hydronephrosis.  Well-preserved cortex.  Normal parenchymal echotexture without focal parenchymal abnormality. Prominent column of Bertin.  No shadowing calculi.  Approximately 12.9 cm in length.  Minimal perinephric edema as noted on the prior CT.  Left Kidney:  No hydronephrosis.  Well-preserved cortex.  Normal parenchymal echotexture without focal parenchymal abnormality.  No shadowing calculi.  Approximate 12.5 cm in length.  Bladder:  Decompressed by Foley catheter.  IMPRESSION: No significant abnormalities.  No evidence of hydronephrosis involving either kidney to suggest obstruction.  Minimal perinephric edema on the right, unchanged from the CT 08/06/2011.  Original Report Authenticated By: Arnell Sieving, M.D.   Dg Chest Port 1 View  08/06/2011  *RADIOLOGY REPORT*  Clinical Data: PICC placement.  PORTABLE CHEST - 1 VIEW  Comparison: PA and lateral chest 08/06/2011 at 7:47 a.m.  Findings: Right PICC has its tip at the superior cavoatrial junction.  The catheter could be withdrawn 2-2.5 cm for better positioning.  NG tube has in good position with the side port in the stomach.  Lungs are clear.  Heart size normal.  No pneumothorax or pleural fluid.  IMPRESSION:  1.  Tip of right PICC is just within the right atrium.  The catheter could be withdrawn 2-2.5 cm for better positioning. 2.  NG tube in good position. 3.  Resolved left mid lung atelectasis.  Original Report Authenticated By: Bernadene Bell. D'ALESSIO, M.D.   Dg Abd 2 Views  09/02/2011  *  RADIOLOGY REPORT*  Clinical Data: History of hernia repair complicated by postoperative ileus.  Follow-up film.  ABDOMEN  - 2 VIEW  Comparison: Abdominal radiograph 08/13/2011.  Findings: There is gas and stool scattered throughout the colon extending to at least the level of the proximal rectum (distal rectum is excluded from the lower margin of the images).  There are several nondilated loops of gas-filled small bowel scattered throughout the central abdomen, measuring up to approximate 2.9 cm in diameter.  The no definite air-fluid levels are appreciated.  No gross evidence of pneumoperitoneum. Surgical clips project over the right upper quadrant of the abdomen, consistent with prior cholecystectomy.  IMPRESSION: 1.  Nonspecific, nonobstructive bowel gas pattern, as above. 2.  No pneumoperitoneum. 3.  Status post cholecystectomy.  Original Report Authenticated By: Florencia Reasons, M.D.   Dg Abd 2 Views  08/11/2011  *RADIOLOGY REPORT*  Clinical Data: Abdominal distension, recurring ileus  ABDOMEN - 2 VIEW  Comparison: 08/09/2011; 08/08/2011; chest radiograph - 08/06/2011  Findings:  An enteric tube overlies the left upper abdominal quadrant. Interval increased gaseous distension of multiple loops of large and small bowel.  Interval passage of previously identified enteric contrast.  No pneumoperitoneum, pneumatosis or portal venous gas. Post cholecystectomy.  Limited visualization of the lower thorax demonstrates the tip of a right upper extremity PICC line overlying the superior aspect of the right atrium.  There is mild elevation of the left hemidiaphragm.  No acute osseous abnormalities.  IMPRESSION: Increased gaseous distension of multiple loops of large and small bowel most suggestive of ileus.  Original Report Authenticated By: Waynard Reeds, M.D.   Dg Abd 2 Views  08/09/2011  *RADIOLOGY REPORT*  Clinical Data: Small bowel obstruction versus ileus  ABDOMEN - 2 VIEW  Comparison: 08/08/2011  Findings: Cholecystectomy clips noted within the right upper quadrant the abdomen.  There is a nasogastric tube which is coiled in  the stomach.  Interval decompression of the gastric lumen and improvement in small bowel distention.    Enteric contrast material is again noted within the right colon.  IMPRESSION:  1. Improving small bowel obstruction pattern.  Original Report Authenticated By: Rosealee Albee, M.D.   Dg Abd 2 Views  08/08/2011  *RADIOLOGY REPORT*  Clinical Data: Postoperative ileus.  Gastric distention.  ABDOMEN - 2 VIEW  Comparison: CT abdomen pelvis 08/06/2011.  Plain films the abdomen 08/04/2011 and 08/06/2011.  Findings: NG tube is in place.  Distention of the stomach persists but appears improved.  There is persistent gaseous distention of small bowel loops measuring up to 6.1 cm, slightly improved. Contrast material from the patient's CT scan is now visualized in the colon.  No free intraperitoneal air is identified.  IMPRESSION:  1.  Mild improvement in small bowel dilatation compatible with improved ileus or obstruction.  Contrast material is now seen the colon. 2.  Some improvement gaseous distention of the stomach. 3.  No free peritoneal air.  Original Report Authenticated By: Bernadene Bell. D'ALESSIO, M.D.   Dg Abd 2 Views  08/06/2011  *RADIOLOGY REPORT*  Clinical Data: Postop hernia repair 1 week ago with pain in the upper abdomen  ABDOMEN - 2 VIEW  Comparison: Abdomen films of 08/04/2011  Findings: There is significant gaseous distention of the stomach with air-fluid level within the stomach. In addition however there is gaseous distention of loops of small bowel as well.  Although this could be due to postoperative ileus, a partial small bowel obstruction cannot be excluded.  No free air is seen on the erect view.  Surgical clips are present in the right upper quadrant from prior cholecystectomy.  IMPRESSION:   Significant gaseous distention of the stomach with gaseous distention of small bowel with air-fluid levels.  Possible ileus but cannot exclude partial small bowel obstruction.  Original Report Authenticated  By: Juline Patch, M.D.   Dg Kayleen Memos W/water Sol Cm  08/11/2011  *RADIOLOGY REPORT*  Clinical Data:  Diabetic with recurrent emesis, gastric and small bowel distension.  Question gastric obstruction or gastroparesis.  UPPER GI SERIES WITHOUT KUB  Technique:  Routine upper GI series was performed with water- soluble contrast (300 ml Omnipaque-300) per nasogastric tube.  Fluoroscopy Time: 0.55 minutes  Comparison:  Abdominal radiographs same date.  Abdominal pelvic CT 08/06/2011.  Findings: The radiographs obtained earlier today demonstrate moderate distension of the stomach and proximal small bowel.  There is mild distension of the distal small bowel and colon.  The stomach was filled easily.  Turning the patient erect and into the right lateral decubitus positions demonstrates no mucosal ulceration or wall thickening.  Within the right lateral decubitus position, there is rapid emptying into a moderately distended duodenum.  The duodenum demonstrates uniform fold thickening.  No focal mucosal ulceration is identified. Contrast passes between the superior mesenteric artery and the aorta.  The fourth portion of the duodenum and proximal jejunum are also mildly distended.  No focal transition point or extravasation is seen.  The esophagus is not evaluated by this examination performed per nasogastric tube.  No reflux was seen.  IMPRESSION:  1.  No evidence of gastric outlet obstruction. 2.  The stomach and proximal small bowel are moderately distended without demonstrated focal transition point.  Recent CT demonstrated a relatively gradual transition to normal caliber distal small bowel such that mechanical obstruction remains unlikely.  Radiographic followup suggested. 3.  Uniform wall thickening of the duodenum likely represents edema.  Original Report Authenticated By: Gerrianne Scale, M.D.    Admission HPI: Patient is 61 yo woman with h/o DM, CHF, HTN, Afib and hypothyroidism who presents with acute onset  weakness and dizziness described as the room spinning since the afternoon of DOA. Her son at bedside reports she has been c/o feeling light headed for the past few days, as well as experiencing a decreased appetite & dry mouth but no N/V/diarrhea/abdominal pain. Patient thought symptoms were d/t her afib being out of control. She also c/o left arm numbness after laying on the couch for the past few days. She was prompted to come to the ED today because her roommate noted that she had slurred speech and decreased left handed grasp. Finally, she c/o increased photophobia when she goes outside for the last 1-2 days.  No new medications or dosage changes made recently. Per patient's son, patient has had a decreased appetite since hospital discharge on 08/17/11, when she was admitted for multiple incarcerated ventral hernias and underwent laparoscopic ventral hernia repair.   Admission Physical Exam:  Blood pressure 82/38, pulse 46, temperature 97.2 F (36.2 C), temperature source Oral, resp. rate 17, height 5\' 3"  (1.6 m), weight 181 lb 3.5 oz (82.2 kg), last menstrual period 06/03/1978, SpO2 100.00%.  General: resting in bed, no acute distress, cooperative to exam  HEENT: PERRL, EOMI, no scleral icterus, no conjunctival pallor  Cardiac: bradycardic, irregular, no rubs, murmurs or gallops  Pulm: clear to auscultation bilaterally, moving normal volumes of air, no wheezing/rales/rhonchi  Abd: soft, nontender, nondistended, BS normoactive,  wounds from laparoscopy healing without purulence/erythema  Ext: warm and well perfused, no pedal edema  Neuro: alert and oriented X3, cranial nerves II-XII grossly intact, nonfocal exam with sensation intact  Hospital Course by problem list:  79 woman admitted for few days of weakness, vertigo, and profound anorexia. Has only been home few days after admission for ventral hernia surgery. Found to have hypotension, bradycardia, creatinine of 7.8 (was 0.5 2 weeks ago), and  dig level of 3.5. She has been taking dig, diltiazem, and atenolol.  Since admission, we are holding these and most other meds. Repeat Bmet few hours later was slightly improved (creat 7.8 to 7.3). Ventr response remains slow (30s to 40s) and BP is soft. She has no other sx and no abdominal pain or tenderness. Wound looks good. Afrbrile.  Impression: Profound anorexia since surgery w/o evident cause. This has led to ARF, hopefully all pre-renal. UA does not suggest ATN.  Rx: Agree with holding all dromotropic meds and vigorous volume repletion. Repeat Bmet later this AM -- if improving, carry on. Watch O2 sat to avoid over-hydration. Id Creatinine does not continue to improve, notify renal service. Keep on close monitoring until rate and BP are stable and creatinine is clearly correcting.  If rate and/or BP become emergent, options include Digibind antibody or temporary pacemaker.  #1 Acute Renal Failure: Ms. Alona [name] was admitted with  elevated creatinine of 7.8 above a normal baseline. This was found to be secondary to digoxin toxicity as she had substantially poor oral intake of food or beverage due to nausea in setting of recent abdominal surgery complicated by ileus as well as concurrent ACE-I and Lasix therapy.  Renal Service was consulted. She was given IV fluids with discontinuation of atenolol, digoxin, diltiazem, lasix, glipizide and lisinopril.  She had appropriate urine output and renal ultrasound was w/o significant abnormalities and minimal perinephric edema on the right, unchanged from CT in May 2013.  By hospital day #2 her creatinine began trending down and was within normal limits by day of discharge.    Lab 09/03/11 0842 09/02/11 0825 09/01/11 0525 08/31/11 0817 08/30/11 0830  CREATININE 0.96 1.15* 1.61* 2.50* 4.95*    Lab  08/29/11 1028  08/29/11 0418  08/29/11 0014   CREATININE  6.80*  7.32*  7.81*     #2 Digoxin Toxicity with hypotension and bradycardia : Admission level of 3.5  (0.8-2.0 wnl) with bradycardia and hypotension. She was monitored on telemetry and Digoxin was held. During hospitalization her levels returned to normal of 1.9 with improvement in her kidney function, and resolution of bradycardia and hypotension.  She was NOT resumed on Digoxin but maintained on atenolol 25 mg po bid and diltiazem (XR) 180 mg daily for rate control by day of discharge.     #3 Elevated Anion Gap: Admitted with anion gap of 16 likely secondary to uremia.  This reolved by HD#2 after IV fluids administration.    #4 Depression: This was a contributor to her decreased oral intake.  A Psychiatry consultation was placed which confirmed her long term history of depression and recent exacerbation secondary to her ex-husband being murdered by his wife.  Her Prozac was double to 40 mg from 20 mg qd which she has been on for nearly a decade per patients report.  She was also started on Remeron was mood and appetite stimulation.   #5 Hyponatremia: Resolved after fluid administration. Likely secondary to hypovolemic in setting of decreased PO intake and hypotension.  Lab  08/31/11 0817  08/30/11 0830  08/29/11 1028  08/29/11 0418  08/29/11 0014   NA  142  138  130*  131*  127*     Lab 09/03/11 0842 09/02/11 0825 09/01/11 0525 08/31/11 0817 08/30/11 0830  NA 138 138 140 142 138    #6 Atrial Fibrillation : After resolution of bradycardia, Ms. Alona [name] returned to Atrial Fibrillation with Rapid Ventricular Response. She experience an episode of chest pain which quickly resolved with SL Nitroglycen.This likely resulted in a mild increase in troponin level secondary to increased cardiac demand.  Cardiology was consulted and she was evaluated by her Cardiologist Dr. Antoine Poche and determined not to be in Acute Coronary Syndrome. Her Coumadin was super-therapeutic due to her Acute Renal Failure thus initially held during this hospitalization until her kidney function improved. By day of discharge she was  resumed on home regimen of Coumadin with INR within therapeutic range. She will follow-up with Dr. Jenene Slicker office July 12 at 11:10 for further management.  #7 Type 2 DM: Under good control with Hemoglobin A1c = 6.3 on 07/29/11. CBG's remained well controlled during hospitalization well controlled on Sliding Scale Insulin.  She will resume her home regimen of glipizide.  CBG (last 3)   Basename 09/04/11 0748 09/03/11 2137 09/03/11 1645  GLUCAP 109* 104* 100*   #8 Inadequate oral intake: mostly secondary to depression with a component of nausea.  Nutrition and Psychiatrist were consulted. She disliked several nutritional supplements and ate <10% of her meals for the majority of her hospitalization. She was started on cyproheptadine for appetite stimulation as well as Remeron.  She was put on scheduled Zofran which appeared to help her appetite.  She should remain on a stool softener with monitoring of her urine out put as cyproheptadine can result in constipation and urinary retention.   #10 Hypothyroidism: Last TSH = 0.756 (wnl) on 07/31/11. She was continued on home dose levothyroxine.  #11 s/p ventral hernia repair (5/22): complicated by post op ileus and gastroparesis.  And abdominal XRay was obtained to rule out ileus as source of her nausea and decreased appetite. Imaging was without pattern of obstruction.  #12 Disposition:  She will need Skilled Nursing per Physical Therapy recommendations as Ms. Alona [name] is in of physical strengthening to care for herself.  A follow-up appointment with her PCP clinic is scheduled for October 02, 2011 at 10:15am.  She will resume her prior Coumadin regimen but remain off of digoxin until further instructions by Dr. Antoine Poche.   Discharge Vitals:  BP 156/103  Pulse 93  Temp 98.8 F (37.1 C) (Oral)  Resp 20  Ht 5\' 3"  (1.6 m)  Wt 198 lb 10.2 oz (90.1 kg)  BMI 35.19 kg/m2  SpO2 98%  LMP 06/03/1978  Discharge Labs:  Results for orders placed during the  hospital encounter of 08/29/11 (from the past 24 hour(s))  GLUCOSE, CAPILLARY     Status: Abnormal   Collection Time   09/03/11 12:17 PM      Component Value Range   Glucose-Capillary 112 (*) 70 - 99 mg/dL  GLUCOSE, CAPILLARY     Status: Abnormal   Collection Time   09/03/11  4:45 PM      Component Value Range   Glucose-Capillary 100 (*) 70 - 99 mg/dL  GLUCOSE, CAPILLARY     Status: Abnormal   Collection Time   09/03/11  9:37 PM      Component Value Range   Glucose-Capillary 104 (*) 70 -  99 mg/dL  PROTIME-INR     Status: Abnormal   Collection Time   09/04/11  6:15 AM      Component Value Range   Prothrombin Time 31.7 (*) 11.6 - 15.2 seconds   INR 3.01 (*) 0.00 - 1.49  GLUCOSE, CAPILLARY     Status: Abnormal   Collection Time   09/04/11  7:48 AM      Component Value Range   Glucose-Capillary 109 (*) 70 - 99 mg/dL   Comment 1 Documented in Chart      Signed: ,  09/04/2011, 11:18 AM   Time Spent on Discharge: 

## 2011-09-04 NOTE — Progress Notes (Signed)
Occupational Therapy Treatment Patient Details Name: OAKLYN JAKUBEK MRN: [id number] DOB: 1950-04-21 Today's Date: 09/04/2011 Time: 3086-5784 OT Time Calculation (min): 25 min  OT Assessment / Plan / Recommendation Comments on Treatment Session Pt now able to donn socks and shoes and tie shoes without adaptive equipment.  These items will not be as necessary at home.  Pt also able to get items off floor without assistive device.    Follow Up Recommendations  Skilled nursing facility;Other (comment)    Barriers to Discharge       Equipment Recommendations  Defer to next venue    Recommendations for Other Services    Frequency Min 2X/week   Plan Discharge plan remains appropriate    Precautions / Restrictions Precautions Precautions: Fall Restrictions Weight Bearing Restrictions: No   Pertinent Vitals/Pain Pt with no c/o pain.  Pt on room air and sats at 96%.    ADL  Eating/Feeding: Independent;Performed Where Assessed - Eating/Feeding: Chair Grooming: Performed;Wash/dry hands;Teeth care;Brushing hair;Modified independent Where Assessed - Grooming: Unsupported standing Upper Body Dressing: Performed;Set up Where Assessed - Upper Body Dressing: Unsupported sitting Lower Body Dressing: Performed;Set up Where Assessed - Lower Body Dressing: Unsupported sit to stand Toilet Transfer: Performed;Supervision/safety Toilet Transfer Method: Sit to Barista: Regular height toilet;Grab bars Toileting - Clothing Manipulation and Hygiene: Performed;Supervision/safety Where Assessed - Toileting Clothing Manipulation and Hygiene: Standing Transfers/Ambulation Related to ADLs: Pt ambulated safely with walker.     OT Diagnosis:    OT Problem List:   OT Treatment Interventions:     OT Goals Acute Rehab OT Goals OT Goal Formulation: With patient Time For Goal Achievement: 09/15/11 Potential to Achieve Goals: Good ADL Goals Pt Will Perform Grooming: with  supervision;Standing at sink;Unsupported ADL Goal: Grooming - Progress: Met Pt Will Perform Lower Body Bathing: with supervision;Sit to stand from bed ADL Goal: Lower Body Bathing - Progress: Met Pt Will Perform Lower Body Dressing: with supervision;Sit to stand from bed ADL Goal: Lower Body Dressing - Progress: Met Pt Will Transfer to Toilet: with supervision;Ambulation;3-in-1;with DME ADL Goal: Toilet Transfer - Progress: Met Pt Will Perform Toileting - Clothing Manipulation: with modified independence;Standing ADL Goal: Toileting - Clothing Manipulation - Progress: Met Pt Will Perform Toileting - Hygiene: Independently;Sit to stand from 3-in-1/toilet ADL Goal: Toileting - Hygiene - Progress: Met  Visit Information  Last OT Received On: 09/04/11 Assistance Needed: +1    Subjective Data      Prior Functioning       Cognition  Overall Cognitive Status: Appears within functional limits for tasks assessed/performed Arousal/Alertness: Awake/alert Orientation Level: Appears intact for tasks assessed Behavior During Session: [facility] for tasks performed Cognition - Other Comments: intact    Mobility Bed Mobility Bed Mobility: Supine to Sit Rolling Left: 7: Independent Supine to Sit: 7: Independent Details for Bed Mobility Assistance: no assist needed. Transfers Transfers: Sit to Stand;Stand to Sit Sit to Stand: 5: Supervision Stand to Sit: 5: Supervision   Exercises    Balance Balance Balance Assessed: No  End of Session OT - End of Session Activity Tolerance: Patient tolerated treatment well Patient left: in chair;with call bell/phone within reach Nurse Communication: Mobility status  GO     Hope Budds 09/04/2011, 12:37 PM

## 2011-09-04 NOTE — Progress Notes (Signed)
Nutrition Follow-up  Intervention:   1. Suspect poor intake likely due to disordered eating pattern, per psych SW note. Recommend outpatient follow-up with current counselor. 2. If intake does not improve, consider short-term enteral nutrition to help meet patient's nutrition needs given pt meeting criteria for severe malnutrition 3. Discontinue Resource Breeze PO daily 4. RD to continue to follow nutrition care plan  Assessment: RD has been following pt for poor PO intake and ongoing weight loss. Noted, however that psych evaluated pt on 6/25. Noted that pt typically lives alone and has had depression since surgery in May. She reports she does not eat because it makes her so sick and feel even worse. She reports she wants to eat, but cannot hold anything down.   Per Pysch SW note "Spoke at length with patient's son who reports that patient has a past eating disorder problem where she withholds food. Pt used to be very thin, anorexia and her mother also had issues with food where she had to be institutionalized. Patient' grandmother as well had issues with laxatives. Son reports he feels patient is withholding food because she is happy with her weight at the current time and does not want to regain weight. He reports when she first came home from the hospital, she stayed with him  she was motivated and excited to get home. She was eating and happy. He reports this all changed a few days later when patient refused to eat in fear of gaining weight. Patient has never been institutionalized per his report, but he reports she was active with a counselor at [facility] within the last year."   This RD did not discuss these new findings with patient, however pt states that her intake has somewhat improved. Does not like [employer], as she says it causes   Diet Order:  Regular Supplement: Snacks between meals, Breeze daily  Meds: Scheduled Meds:   . atenolol  25 mg Oral BID  . cyproheptadine  2 mg Oral  Q6H  . diltiazem  180 mg Oral Daily  . docusate sodium  100 mg Oral BID  . feeding supplement  1 Container Oral Q24H  . FLUoxetine  40 mg Oral Daily  . insulin aspart  0-5 Units Subcutaneous QHS  . insulin aspart  0-9 Units Subcutaneous TID WC  . insulin glargine  5 Units Subcutaneous QHS  . levothyroxine  75 mcg Oral Q0600  . metoCLOPramide  5 mg Oral Q6H  . mirtazapine  15 mg Oral QHS  . ondansetron  4 mg Oral Q12H   Or  . ondansetron (ZOFRAN) IV  4 mg Intravenous Q12H  . senna-docusate  1 tablet Oral QHS  . warfarin  1 mg Oral ONCE-1800  . warfarin  1 mg Oral ONCE-1800  . Warfarin - Pharmacist Dosing Inpatient   Does not apply q1800   Continuous Infusions:   . sodium chloride 20 mL/hr (09/03/11 1324)   PRN Meds:.acetaminophen, acetaminophen, albuterol, polyethylene glycol  Labs:  CMP     Component Value Date/Time   NA 138 09/03/2011 0842   K 3.7 09/03/2011 0842   CL 105 09/03/2011 0842   CO2 22 09/03/2011 0842   GLUCOSE 129* 09/03/2011 0842   BUN 15 09/03/2011 0842   CREATININE 0.96 09/03/2011 0842   CREATININE 0.75 05/08/2011 1547   CALCIUM 9.0 09/03/2011 0842   PROT 6.8 08/29/2011 0014   ALBUMIN 2.8* 09/01/2011 0525   AST 13 08/29/2011 0014   ALT 16 08/29/2011 0014  ALKPHOS 83 08/29/2011 0014   BILITOT 0.3 08/29/2011 0014   GFRNONAA 62* 09/03/2011 0842   GFRAA 71* 09/03/2011 0842     Intake/Output Summary (Last 24 hours) at 09/04/11 1041 Last data filed at 09/04/11 0955  Gross per 24 hour  Intake 1325.67 ml  Output    500 ml  Net 825.67 ml    Weight Status:  198 lb/90.1 kg - wt up 17 lb since admission  Re-estimated needs:  1650 - 1800 kcal, 75 - 85 grams  Nutrition Dx:  Inadequate oral intake r/t decreased appetite AEB 8% wt loss PTA, meal completion of </=50% of meals. Ongoing.  Goal:  Pt to meet >/= 90% of their estimated nutrition needs - unmet  Monitor:  PO intake, weights, labs, I/O's  Jarold Motto MS, RD, LDN Pager#: [phone] After hours pager#:  [phone]

## 2011-09-04 NOTE — Progress Notes (Signed)
ANTICOAGULATION CONSULT NOTE - Follow Up Consult  Pharmacy Consult for Coumadin Indication: atrial fibrillation  Allergies  Allergen Reactions  . Gemfibrozil Swelling    REACTION: Angioedema  . Latex Other (See Comments)    Blisters where touched or applied  . Penicillins Hives    Will spread in patches all over the body.  . Adhesive (Tape) Other (See Comments)    Will blister skin where applied - do not use BAND-AIDS.  Marland Kitchen Codeine Hives    Will spread in patches all over the body.    Patient Measurements: Height: 5\' 3"  (160 cm) Weight: 198 lb 10.2 oz (90.1 kg) IBW/kg (Calculated) : 52.4  Heparin Dosing Weight:   Vital Signs: Temp: 98.8 F (37.1 C) (06/28 0500) Temp src: Oral (06/28 0500) BP: 156/103 mmHg (06/28 0500) Pulse Rate: 93  (06/28 0500)  Labs:  Alvira Philips 09/04/11 0615 Sep 12, 2011 0102 2011/09/12 0535 09/02/11 0825 09/02/11 0515 09/01/11 2133 09/01/11 1304  HGB -- 10.1* -- 10.1* -- -- --  HCT -- 30.0* -- 29.4* -- -- --  PLT -- 211 -- 209 -- -- --  APTT -- -- -- -- -- -- --  LABPROT 31.7* -- 36.4* -- 39.6* -- --  INR 3.01* -- 3.59* -- 4.00* -- --  HEPARINUNFRC -- -- -- -- -- -- --  CREATININE -- 0.96 -- 1.15* -- -- --  CKTOTAL -- -- -- -- -- 42 45  CKMB -- -- -- -- -- 3.5 3.9  TROPONINI -- -- -- -- -- 0.56* 0.69*    Estimated Creatinine Clearance: 63.9 ml/min (by C-G formula based on Cr of 0.96).  Assessment: 61yof on chronic Coumadin for Afib. Pharmacy now consulted to manage. INR (3.01) remains slightly supratherapeutic. She did not received coumadin 6/25 and 6/26 due to increased INR and 1 mg coumadin 12-Sep-2022 Noted pt has had deceased appetite. - H/H and Plts stable - No significant bleeding reported  Goal of Therapy:  INR 2-3   Plan:  1. Repeat small dose of coumadin today to prevent decrease in INR to subtherapeutic level. 2. Daily PT/INR  Talbert Cage Poteet [phone] 09/04/2011,8:55 AM

## 2011-09-18 ENCOUNTER — Ambulatory Visit (INDEPENDENT_AMBULATORY_CARE_PROVIDER_SITE_OTHER): Payer: Medicare Other | Admitting: Physician Assistant

## 2011-09-18 ENCOUNTER — Ambulatory Visit (INDEPENDENT_AMBULATORY_CARE_PROVIDER_SITE_OTHER): Payer: Medicare Other | Admitting: Pharmacist

## 2011-09-18 ENCOUNTER — Encounter: Payer: Self-pay | Admitting: Physician Assistant

## 2011-09-18 VITALS — BP 113/78 | HR 98 | Ht 63.0 in | Wt 167.0 lb

## 2011-09-18 DIAGNOSIS — I4891 Unspecified atrial fibrillation: Secondary | ICD-10-CM

## 2011-09-18 DIAGNOSIS — G459 Transient cerebral ischemic attack, unspecified: Secondary | ICD-10-CM | POA: Diagnosis not present

## 2011-09-18 DIAGNOSIS — I10 Essential (primary) hypertension: Secondary | ICD-10-CM | POA: Diagnosis not present

## 2011-09-18 MED ORDER — ATENOLOL 25 MG PO TABS: 37.5000 mg | ORAL_TABLET | Freq: Two times a day (BID) | ORAL | Status: DC

## 2011-09-18 NOTE — Progress Notes (Signed)
[address] [place], [place]  [address] Phone: [phone] Fax:  [phone]  Date:  09/18/2011   Name:  ALYRICA THUROW   DOB:  14-Apr-1950   MRN:  [id number]  PCP:  Tacey Heap, MD  Primary Cardiologist:  Dr. Rollene Rotunda  Primary Electrophysiologist:  None    History of Present Illness: ALFIE ALDERFER is a 61 y.o. female who returns for post hospital follow up.  She has a history of atrial fibrillation, depression, carotid stenosis, fibromyalgia, GERD, HL, prior TIA, HTN, hypothyroidism, DM2. LHC 06/2002: Normal coronary arteries, EF 60%. Myoview 05/2009: EF 68%, no ischemia. Echocardiogram 05/2007: EF 55-65%, mild to moderate LAE, mild RAE. Carotid Dopplers 01/2011: RICA 40-59%, LICA 0-39%.   She underwent incarcerated ventral hernia repair in 08/2011 complicated by postoperative ileus.  She was then readmitted 6/22-6/25 with acute renal failure (peak creatinine 7.81) likely related to poor p.o. Intake in the setting of concomitant ACE inhibitor and Lasix therapy.  Her renal failure resulted in digoxin toxicity with a digoxin level of 3.5 and her heart rate was down in the 40s in atrial fibrillation.  She was seen by cardiology for further evaluation and management of chest pain as well as her atrial fibrillation.  She had minimally elevated troponins.  This was felt to be nonspecific and no further workup was recommended.  As her renal function improved her heart rate increased and her rate controlling medications were adjusted.  She was not placed back on digoxin.  Of note, she was also seen by psychiatry as depression was thought to contribute to her poor p.o. Intake.  She is doing better.  Still feels weak.  Denies significant shortness of breath.  Denies chest pain.  Denies orthopnea or PND.  Denies edema.  Denies syncope.  She did have some palpitations when she first came home.  She denies any further palpitations.  She denies any bleeding problems.  Wt  Readings from Last 3 Encounters:  09/18/11 167 lb (75.751 kg)  09/03/11 198 lb 10.2 oz (90.1 kg)  08/21/11 171 lb (77.565 kg)     Potassium  Date/Time Value Range Status  09/03/2011  8:42 AM 3.7  3.5 - 5.1 mEq/L Final   Creatinine, Ser  Date/Time Value Range Status  09/03/2011  8:42 AM 0.96  0.50 - 1.10 mg/dL Final   Hemoglobin  Date/Time Value Range Status  09/03/2011  8:42 AM 10.1* 12.0 - 15.0 g/dL Final    Past Medical History  Diagnosis Date  . Depression   . Obesity   . Skin neoplasm   . Bunion   . Carotid stenosis   . Fibromyalgia   . Internal hemorrhoid   . GERD (gastroesophageal reflux disease)   . Chronic gastritis   . Transaminase or LDH elevation   . Mild cognitive impairment   . Hyperlipidemia   . Fatty liver   . Lumbar back pain   . Diabetic peripheral neuropathy   . Diverticulosis   . Dysphagia     no documented strictures but responded positively to dilation in past.   . Hypertension   . Cholecystitis     s/p cholecystectomy  . Sarcoidosis   . Fibromyalgia   . CHF (congestive heart failure)   . Skin cancer 1990's    "front of my right leg"  . Atrial fibrillation   . OSA (obstructive sleep apnea) 2003    "can't sleep w/that darm machine"  . Exertional dyspnea   . Hypothyroidism   .  Type II diabetes mellitus   . TIA (transient ischemic attack)     07/29/11 pt denies this history  . H/O hiatal hernia   . Epileptic seizure, tonic     .No meds since age of 44.  . Osteoarthritis   . Stroke     tia    Current Outpatient Prescriptions  Medication Sig Dispense Refill  . albuterol (PROVENTIL HFA;VENTOLIN HFA) 108 (90 BASE) MCG/ACT inhaler Inhale 2 puffs into the lungs every 6 (six) hours as needed. For shortness of breath      . ALPRAZolam (XANAX) 0.5 MG tablet Take 1 tablet (0.5 mg total) by mouth at bedtime as needed for sleep or anxiety.  6 tablet  0  . atenolol (TENORMIN) 25 MG tablet Take 25 mg by mouth 2 (two) times daily.      . Calcium  Carbonate-Vitamin D (CALCIUM 600+D) 600-400 MG-UNIT per tablet Take 1 tablet by mouth 2 (two) times daily with a meal.       . cyclobenzaprine (FLEXERIL) 5 MG tablet Take 5 mg by mouth 2 (two) times daily as needed. For fibromyalgia      . diltiazem (DILACOR XR) 180 MG 24 hr capsule Take 180 mg by mouth daily.      . fish oil-omega-3 fatty acids 1000 MG capsule Take 1 g by mouth 2 (two) times daily.       Marland Kitchen FLUoxetine (PROZAC) 20 MG capsule Take 2 capsules (40 mg total) by mouth daily.  60 capsule  6  . gabapentin (NEURONTIN) 100 MG capsule Take 100 mg by mouth 2 (two) times daily.      Marland Kitchen glipiZIDE (GLUCOTROL) 10 MG tablet Take 10 mg by mouth 2 (two) times daily before a meal.      . HYDROcodone-acetaminophen (VICODIN) 5-500 MG per tablet Take 1 tablet by mouth every 12 (twelve) hours as needed. For pain  90 tablet  1  . levothyroxine (SYNTHROID, LEVOTHROID) 75 MCG tablet Take 1 tablet (75 mcg total) by mouth daily.  90 tablet  3  . metFORMIN (GLUCOPHAGE) 1000 MG tablet Take 1 tablet (1,000 mg total) by mouth 2 (two) times daily with a meal.  180 tablet  3  . Multiple Vitamin (MULTIVITAMIN) capsule Take 1 capsule by mouth daily.       . niacin (NIASPAN) 500 MG CR tablet Take 2 tablets (1,000 mg total) by mouth 2 (two) times daily.  360 tablet  3  . omeprazole (PRILOSEC) 20 MG capsule Take 40 mg by mouth as needed. For acid reflux      . pravastatin (PRAVACHOL) 80 MG tablet Take 80 mg by mouth daily.      Marland Kitchen warfarin (COUMADIN) 5 MG tablet Take 2.5-5 mg by mouth daily. Takes 5 mg on Monday Wednesday and Friday. Takes 2.5mg  on Tuesday, Thursday, Saturday, and Sunday.      . B Complex-Biotin-FA (SUPER B-50 B COMPLEX PO) Take 1 tablet by mouth 2 (two) times daily.       Marland Kitchen DISCONTD: albuterol (PROVENTIL HFA;VENTOLIN HFA) 108 (90 BASE) MCG/ACT inhaler Inhale 2 puffs into the lungs every 6 (six) hours as needed. For shortness of breath  3 Inhaler  3  . DISCONTD: atenolol (TENORMIN) 25 MG tablet Take 1  tablet (25 mg total) by mouth 2 (two) times daily.  180 tablet  3  . DISCONTD: diltiazem (DILACOR XR) 180 MG 24 hr capsule Take 1 capsule (180 mg total) by mouth daily.  90 capsule  3  .  DISCONTD: FLUoxetine (PROZAC) 20 MG capsule Take 1 capsule (20 mg total) by mouth daily.  90 capsule  3  . DISCONTD: omeprazole (PRILOSEC) 20 MG capsule Take 2 capsules (40 mg total) by mouth as needed.  90 capsule  3    Allergies: Allergies  Allergen Reactions  . Gemfibrozil Swelling    REACTION: Angioedema  . Latex Other (See Comments)    Blisters where touched or applied  . Penicillins Hives    Will spread in patches all over the body.  . Adhesive (Tape) Other (See Comments)    Will blister skin where applied - do not use BAND-AIDS.  Marland Kitchen Codeine Hives    Will spread in patches all over the body.    History  Substance Use Topics  . Smoking status: Never Smoker   . Smokeless tobacco: Never Used  . Alcohol Use: No     ROS:  Please see the history of present illness.   She has been constipated.   All other systems reviewed and negative.   PHYSICAL EXAM: VS:  BP 113/78  Pulse 98  Ht 5\' 3"  (1.6 m)  Wt 167 lb (75.751 kg)  BMI 29.58 kg/m2  LMP 06/03/1978 Well nourished, well developed, in no acute distress HEENT: normal Neck: no JVD Cardiac:  normal S1, S2; Irregularly irregular rhythm, no murmur Lungs:  clear to auscultation bilaterally, no wheezing, rhonchi or rales Abd: soft, nontender, no hepatomegaly Ext: no edema Skin: warm and dry Neuro:  CNs 2-12 intact, no focal abnormalities noted  EKG:  Atrial fibrillation, heart rate 93, left axis deviation      ASSESSMENT AND PLAN:  1.  Atrial fibrillation Rate control seems marginal.  I will adjust her atenolol to 37.5 mg twice a day.  Check a 24-hour Holter monitor approximately 2 weeks after adjusting her beta blocker.  Continue Coumadin.  Follow up with Dr. Antoine Poche in 6 weeks.  2.  Hypertension Controlled.  She can discuss whether or  not to restart her ACE inhibitor with her PCP.  3.  Acute renal failure-resolved Creatinine was back to normal at discharge.  This was in the context of dehydration from poor po intake in the setting of ACE inhibitor and Lasix therapy complicated by digoxin toxicity.  She will not be placed back on digoxin.  4.  Constipation She may get Colace over-the-counter and take as needed.  Signed, Tereso Newcomer, PA-C  11:58 AM 09/18/2011

## 2011-09-18 NOTE — Patient Instructions (Addendum)
Your physician recommends that you schedule a follow-up appointment in: APPROX 6 WEEKS WITH DR. HOCHREIN  INCREASE ATENOLOL TO 37.5 MG TWICE DAILY THIS WILL BE 1 AND 1/2 TABLETS TWICE DAILY  YOU CAN USE OTC COLACE STOOL SOFTENER 1-2 TIMES DAILY AS NEEDED FOR CONSTIPATION  NO OTHER CHANGES WERE MADE TODAY

## 2011-09-25 ENCOUNTER — Ambulatory Visit (INDEPENDENT_AMBULATORY_CARE_PROVIDER_SITE_OTHER): Payer: Medicare Other | Admitting: *Deleted

## 2011-09-25 DIAGNOSIS — I4891 Unspecified atrial fibrillation: Secondary | ICD-10-CM

## 2011-09-25 DIAGNOSIS — G459 Transient cerebral ischemic attack, unspecified: Secondary | ICD-10-CM

## 2011-09-25 DIAGNOSIS — Z7901 Long term (current) use of anticoagulants: Secondary | ICD-10-CM

## 2011-09-25 LAB — POCT INR: INR: 2.1

## 2011-10-02 ENCOUNTER — Telehealth: Payer: Self-pay | Admitting: Gastroenterology

## 2011-10-02 ENCOUNTER — Encounter: Payer: Self-pay | Admitting: Internal Medicine

## 2011-10-02 ENCOUNTER — Ambulatory Visit (INDEPENDENT_AMBULATORY_CARE_PROVIDER_SITE_OTHER): Payer: Medicare Other | Admitting: Internal Medicine

## 2011-10-02 VITALS — BP 106/60 | HR 92 | Temp 98.1°F | Ht 63.0 in | Wt 174.3 lb

## 2011-10-02 DIAGNOSIS — Z8639 Personal history of other endocrine, nutritional and metabolic disease: Secondary | ICD-10-CM

## 2011-10-02 DIAGNOSIS — K625 Hemorrhage of anus and rectum: Secondary | ICD-10-CM

## 2011-10-02 DIAGNOSIS — E785 Hyperlipidemia, unspecified: Secondary | ICD-10-CM

## 2011-10-02 DIAGNOSIS — K5900 Constipation, unspecified: Secondary | ICD-10-CM

## 2011-10-02 DIAGNOSIS — E039 Hypothyroidism, unspecified: Secondary | ICD-10-CM | POA: Diagnosis not present

## 2011-10-02 DIAGNOSIS — M545 Low back pain, unspecified: Secondary | ICD-10-CM

## 2011-10-02 DIAGNOSIS — K219 Gastro-esophageal reflux disease without esophagitis: Secondary | ICD-10-CM

## 2011-10-02 DIAGNOSIS — N289 Disorder of kidney and ureter, unspecified: Secondary | ICD-10-CM

## 2011-10-02 DIAGNOSIS — D649 Anemia, unspecified: Secondary | ICD-10-CM

## 2011-10-02 DIAGNOSIS — I4891 Unspecified atrial fibrillation: Secondary | ICD-10-CM

## 2011-10-02 DIAGNOSIS — R42 Dizziness and giddiness: Secondary | ICD-10-CM

## 2011-10-02 DIAGNOSIS — T460X5A Adverse effect of cardiac-stimulant glycosides and drugs of similar action, initial encounter: Secondary | ICD-10-CM

## 2011-10-02 DIAGNOSIS — T460X1A Poisoning by cardiac-stimulant glycosides and drugs of similar action, accidental (unintentional), initial encounter: Secondary | ICD-10-CM

## 2011-10-02 DIAGNOSIS — I2510 Atherosclerotic heart disease of native coronary artery without angina pectoris: Secondary | ICD-10-CM | POA: Diagnosis not present

## 2011-10-02 DIAGNOSIS — K5730 Diverticulosis of large intestine without perforation or abscess without bleeding: Secondary | ICD-10-CM

## 2011-10-02 DIAGNOSIS — IMO0001 Reserved for inherently not codable concepts without codable children: Secondary | ICD-10-CM | POA: Diagnosis not present

## 2011-10-02 DIAGNOSIS — I10 Essential (primary) hypertension: Secondary | ICD-10-CM

## 2011-10-02 DIAGNOSIS — E119 Type 2 diabetes mellitus without complications: Secondary | ICD-10-CM | POA: Diagnosis not present

## 2011-10-02 DIAGNOSIS — Z0000 Encounter for general adult medical examination without abnormal findings: Secondary | ICD-10-CM

## 2011-10-02 DIAGNOSIS — K5790 Diverticulosis of intestine, part unspecified, without perforation or abscess without bleeding: Secondary | ICD-10-CM

## 2011-10-02 DIAGNOSIS — Z862 Personal history of diseases of the blood and blood-forming organs and certain disorders involving the immune mechanism: Secondary | ICD-10-CM

## 2011-10-02 LAB — BASIC METABOLIC PANEL WITH GFR
Calcium: 10.8 mg/dL — ABNORMAL HIGH (ref 8.4–10.5)
GFR, Est African American: 74 mL/min
GFR, Est Non African American: 64 mL/min
Glucose, Bld: 74 mg/dL (ref 70–99)
Potassium: 4.3 mEq/L (ref 3.5–5.3)
Sodium: 139 mEq/L (ref 135–145)

## 2011-10-02 LAB — CBC
HCT: 34.5 % — ABNORMAL LOW (ref 36.0–46.0)
Hemoglobin: 11.6 g/dL — ABNORMAL LOW (ref 12.0–15.0)
MCHC: 33.6 g/dL (ref 30.0–36.0)
MCV: 94.5 fL (ref 78.0–100.0)
Platelets: 265 10*3/uL (ref 150–400)
RBC: 3.65 MIL/uL — ABNORMAL LOW (ref 3.87–5.11)
RDW: 13.4 % (ref 11.5–15.5)

## 2011-10-02 LAB — LIPID PANEL
Cholesterol: 156 mg/dL (ref 0–200)
HDL: 45 mg/dL (ref >39)
LDL Cholesterol: 55 mg/dL (ref 0–99)
Total CHOL/HDL Ratio: 3.5 Ratio
Triglycerides: 278 mg/dL — ABNORMAL HIGH (ref <150)
VLDL: 56 mg/dL — ABNORMAL HIGH (ref 0–40)

## 2011-10-02 LAB — IRON AND TIBC
%SAT: 29 % (ref 20–55)
TIBC: 319 ug/dL (ref 250–470)
UIBC: 226 ug/dL (ref 125–400)

## 2011-10-02 LAB — TRANSFERRIN: Transferrin: 243 mg/dL (ref 200–360)

## 2011-10-02 LAB — FERRITIN: Ferritin: 98 ng/mL (ref 10–291)

## 2011-10-02 LAB — GLUCOSE, CAPILLARY: Glucose-Capillary: 111 mg/dL — ABNORMAL HIGH (ref 70–99)

## 2011-10-02 LAB — POCT INR: INR: 2.1

## 2011-10-02 MED ORDER — HYDROCODONE-ACETAMINOPHEN 5-500 MG PO TABS: 1.0000 | ORAL_TABLET | Freq: Two times a day (BID) | ORAL | Status: DC | PRN

## 2011-10-02 MED ORDER — DILTIAZEM HCL ER 180 MG PO CP24: 180.0000 mg | ORAL_CAPSULE | Freq: Every day | ORAL | Status: DC

## 2011-10-02 MED ORDER — DOCUSATE SODIUM 100 MG PO CAPS: 100.0000 mg | ORAL_CAPSULE | Freq: Two times a day (BID) | ORAL | Status: DC | PRN

## 2011-10-02 MED ORDER — GLIPIZIDE 5 MG PO TABS: 5.0000 mg | ORAL_TABLET | Freq: Two times a day (BID) | ORAL | Status: DC

## 2011-10-02 MED ORDER — OMEPRAZOLE 20 MG PO CPDR: 40.0000 mg | DELAYED_RELEASE_CAPSULE | ORAL | Status: DC | PRN

## 2011-10-02 NOTE — Assessment & Plan Note (Signed)
H/H trending down, prev Hbg was 14 last H/H 08/2011 was 10/30  Plan Ordered Fe studies and ferritin today Try to get in with GI Dr. Jarold Motto w/in 1 week

## 2011-10-02 NOTE — Assessment & Plan Note (Signed)
07/2011 HA1C 6.3 % fsbs today 111 Pt having fsbs in the 70s at home w/ sxs shakiness  Plan Cont Metformin 1000mg  bid, decreased Glipize 10 mg bid to 5 mg bid  Advised pt to bring in meter at each visit

## 2011-10-02 NOTE — Telephone Encounter (Signed)
Pt last seen by Dr Jarold Motto 11/12/09; hx of diverticulosis, esophageal stricture, HH, Cholecystectomy,S/P Fundoplication, Afib, Diabetes, HTN. Last EGD 11/20/09  With dilation; COLON 10/22/08. Pt given an appt 10/06/11 at 1:30pm; Venita Sheffield will inform the pt.

## 2011-10-02 NOTE — Assessment & Plan Note (Addendum)
Chronic 5/10 to 8/10 this visit; worsened by cold air, limits adls, worsened by standing Rx refilled Vicodin 5-500 mg bid #60 RF x 5

## 2011-10-02 NOTE — Assessment & Plan Note (Addendum)
Pt has noticed x 2 weeks and not w/in the last 2 days; toilet w red blood and red blood w/ wiping, denies actual clots 05/2003 colonoscopy with diverticulosis noted   Plan Stat cbc showed H/H trended up from 10.1/30-->11.6/34.5 with MCV 94.5 INR 2.1 today Ordered Fe studies  Referred to GI for colonoscopy with 1 week-of note pt is on Coumadin; pt saw Dr. Jarold Motto in the past Ordered cbc stat and fe studies

## 2011-10-02 NOTE — Assessment & Plan Note (Addendum)
Last tsh 0.756 07/2011 Cont Synthyroid 75 mcg qd

## 2011-10-02 NOTE — Assessment & Plan Note (Addendum)
Pt was dizzy when going from sitting to standing, denies dizziness at initial visit today, denies room spinning Orthostatics neg, neg change in HR or BP Resolved after eating crackers and drinking water Advised pt amitriptyline cause dizziness Pt advised to go to the ED if dizzy, bleeding or chest pain in the future

## 2011-10-02 NOTE — Assessment & Plan Note (Addendum)
EF 55-65% 2009 CHADS score at least 2-4 Cont Pt on Coumadin last INR 2.1 09/25/11-following at [facility]  INR 2.1 10/02/11. Pt is having red rectal bleeding with bowel movements  Plan Will continue pt on Coumadin, it is therapeutic  If pt notices more bleeding stop Coumadin See patient in 1 week

## 2011-10-02 NOTE — Patient Instructions (Addendum)
Please make sure you see the stomach doctors immediately w/in 1-2 weeks Please go to the ED if you are feeling dizzy or notice more rectal bleeding   Decrease your Glipizide to 5 mg bid Please bring your medications and meter into all visits   F/u 1 week, then F/u 3-4 months, sooner if not feeling well

## 2011-10-02 NOTE — Assessment & Plan Note (Signed)
Repeat lipid panel today H/o CAD 01/2010 doppler carotids noted RICA with 40-59% stenosis, LICA with 0-39% stenosis

## 2011-10-02 NOTE — Progress Notes (Signed)
I saw patient and discussed her care with resident Dr. McLean.  I agree with the clinical findings and plans as outlined in her note. 

## 2011-10-03 ENCOUNTER — Encounter: Payer: Self-pay | Admitting: Internal Medicine

## 2011-10-03 NOTE — Assessment & Plan Note (Signed)
Rx refilled Prilosec 40 mg

## 2011-10-03 NOTE — Assessment & Plan Note (Signed)
Rx Colace prn

## 2011-10-03 NOTE — Assessment & Plan Note (Signed)
Noted 07/08/2003 colonoscopy F/u GI appt 10/06/11

## 2011-10-03 NOTE — Assessment & Plan Note (Addendum)
Pt will have GI appt 10/06/11 Pt needs T dap but will wait because insurance will not cover Mammogram due 11/2011

## 2011-10-03 NOTE — Assessment & Plan Note (Signed)
Will check BMP Cont. Atenolol 37.5 bid, Diltiazem 180 q 24, Lasix 40 mg qd.

## 2011-10-03 NOTE — Assessment & Plan Note (Signed)
Resolved, pt no longer taking Digoxin

## 2011-10-03 NOTE — Assessment & Plan Note (Signed)
Resolved at recheck [facility] 10/02/11

## 2011-10-03 NOTE — Assessment & Plan Note (Signed)
Rx refilled Vicodin 5-500 mg bid #60 for pain

## 2011-10-03 NOTE — Progress Notes (Signed)
  Subjective:    Patient ID: [name], female    DOB: 1950-07-07, 61 y.o.   MRN: [id number]  HPI Comments: 61 y.o woman significant PMH and PsuH. Pt presents for f/u for chronic medical conditions DM2 assoc with peripheral neuropathy, HTN, hypertriglyceridemia, hypothyroidism, depression, atrial fibrillation, GERD, CAD, osteoarthritis, fibromyalgia assoc with chronic pain, diverticulosis.  She presents for a hospital f/u.  She was recently hospitalized for digoxin toxicity, digoxin induced hypotension and bradycardia.  Pt has been doing well since discharge.   She reports she has noticed red blood per rectum when having a bowel movement and she has been constipated.  Pt has noticed bleeding pr x 2 weeks but not w/in the last couple of days.  She notices blood in the toilet and the tissue paper is covered with blood.  When she looks into the toilet she notices mixed blood and stool w/o clots.    She reports she needs Rx refills Vicodin for chronic pain, Diltiazem for AF, Prilosec for GERD prn.    SH: pt have 2 kids (son, daughter).  She is on disability x 6 years for medical problems and pain.      Review of Systems  Constitutional: Positive for unexpected weight change. Negative for fever and chills.       Lost 30 lbs recently due to decreased food intake  Respiratory: Negative for shortness of breath.   Cardiovascular: Positive for palpitations. Negative for chest pain.       Intermittent palpitations  Gastrointestinal:       +consipation w/ red blood per rectum  Genitourinary: Negative for dysuria.       H/o dysuria not active today though  Musculoskeletal: Positive for back pain.  Neurological: Positive for dizziness and light-headedness.       Pt noticed dizziness/lightheaded this am Also noticed dizziness when going from sitting to standing in order to sit on examination table       Objective:   Physical Exam  Nursing note and vitals reviewed. Constitutional: She is  oriented to person, place, and time. She appears well-developed and well-nourished. She is cooperative. No distress.       Initial BP 94/62, HR 92, afebrile Pleasant, well dressed  HENT:  Head: Normocephalic and atraumatic.  Mouth/Throat: Oropharynx is clear and moist. Abnormal dentition. No oropharyngeal exudate.  Eyes: Conjunctivae are normal. Pupils are equal, round, and reactive to light. No scleral icterus.  Cardiovascular: Normal rate, S1 normal, S2 normal and normal heart sounds.  A regularly irregular rhythm present.  No murmur heard.      No lower ext edema  Pulmonary/Chest: Effort normal and breath sounds normal. No respiratory distress. She has no wheezes.  Abdominal: Soft. Bowel sounds are normal. She exhibits no distension. There is tenderness in the left lower quadrant.    Neurological: She is alert and oriented to person, place, and time. Gait normal.  Skin: Skin is warm, dry and intact. No rash noted. She is not diaphoretic.       Very tan skin  Psychiatric: She has a normal mood and affect. Her speech is normal and behavior is normal.          Assessment & Plan:  F/u 1 week, f/u GI 10/06/11, overall f/u 3-4 months

## 2011-10-05 ENCOUNTER — Ambulatory Visit (INDEPENDENT_AMBULATORY_CARE_PROVIDER_SITE_OTHER): Payer: Medicare Other | Admitting: Pharmacist

## 2011-10-05 ENCOUNTER — Encounter (INDEPENDENT_AMBULATORY_CARE_PROVIDER_SITE_OTHER): Payer: Self-pay | Admitting: General Surgery

## 2011-10-05 ENCOUNTER — Ambulatory Visit (INDEPENDENT_AMBULATORY_CARE_PROVIDER_SITE_OTHER): Payer: Medicare Other | Admitting: General Surgery

## 2011-10-05 ENCOUNTER — Encounter (INDEPENDENT_AMBULATORY_CARE_PROVIDER_SITE_OTHER): Payer: Medicare Other

## 2011-10-05 VITALS — BP 102/60 | HR 84 | Temp 97.6°F | Resp 18 | Ht 63.0 in | Wt 177.2 lb

## 2011-10-05 DIAGNOSIS — I4891 Unspecified atrial fibrillation: Secondary | ICD-10-CM

## 2011-10-05 DIAGNOSIS — Z7901 Long term (current) use of anticoagulants: Secondary | ICD-10-CM | POA: Diagnosis not present

## 2011-10-05 DIAGNOSIS — K439 Ventral hernia without obstruction or gangrene: Secondary | ICD-10-CM

## 2011-10-05 DIAGNOSIS — G459 Transient cerebral ischemic attack, unspecified: Secondary | ICD-10-CM | POA: Diagnosis not present

## 2011-10-05 DIAGNOSIS — I10 Essential (primary) hypertension: Secondary | ICD-10-CM

## 2011-10-05 LAB — POCT INR: INR: 2

## 2011-10-05 NOTE — Progress Notes (Signed)
Subjective:     Patient ID: [name], female   DOB: 08/07/1950, 61 y.o.   MRN: [id number]  HPI This patient returns for a postop visit. She underwent laparoscopic lysis of adhesions and laparoscopic ventral hernia repair with mesh on 07/29/2011. Postoperative course was completed by prolonged gastroparesis and abdominal distention requiring nasogastric suction and TNA. She has now recovered from that. She enjoys normal diet. She has no abdominal complaints.  She was recently hospitalized because of digoxin toxicity. She has recovered from that. She is followed closely by Dr. Margarito Liner in the medicine clinic for diabetes, hypertension, anxiety and depression, atrial fibrillation, coronary disease, and fibromyalgia.  Recently she has become constipated. She's had some bright red rectal bleeding. She is being referred for colonoscopy, and I feel that as appropriate.  Review of Systems     Objective:   Physical Exam Patient looks well. She is in good spirits. No distress.  Abdomen is soft. Nontender. All of the incisions are well-healed. The hernia repair is intact. No seromas.    Assessment:     Complex ventral incisional hernia and extensive adhesions, now completely recovered following laparoscopic lysis of adhesions and ventral hernia repair with mesh. Wound healing is complete.  Recent hospitalization for digoxin toxicity, resolved  Chronic Coumadin therapy  Hypertension  Anxiety and depression  Diabetes mellitus  Atrial fibrillation  Coronary artery disease  Fibromyalgia    Plan:     Okay to resume all normal physical activities without restriction  She was given advice for management of constipation. I told her that I agreed with the colonoscopy plan as outlined by Dr. Meredith Pel.   Return to see me if further problems arise.    Angelia Mould. Derrell Lolling, M.D., [facility] General and Minimally invasive Surgery Breast and Colorectal  Surgery Office:   [phone] Pager:   [phone]

## 2011-10-05 NOTE — Patient Instructions (Signed)
You have recovered from your laparoscopic ventral hernia repair without any obvious complications. You may resume normal physical activities without restriction.  I agree that you should have a colonoscopy because of the rectal bleeding. In the meantime, he should take Metamucil twice a day for your constipation and drink 6 glasses of water a day.  Return to see  Dr. Derrell Lolling if further problems arise.

## 2011-10-06 ENCOUNTER — Ambulatory Visit (INDEPENDENT_AMBULATORY_CARE_PROVIDER_SITE_OTHER): Payer: Medicare Other | Admitting: Nurse Practitioner

## 2011-10-06 ENCOUNTER — Encounter: Payer: Self-pay | Admitting: Nurse Practitioner

## 2011-10-06 VITALS — BP 110/80 | HR 88 | Ht 63.0 in | Wt 177.0 lb

## 2011-10-06 DIAGNOSIS — K625 Hemorrhage of anus and rectum: Secondary | ICD-10-CM

## 2011-10-06 DIAGNOSIS — K649 Unspecified hemorrhoids: Secondary | ICD-10-CM

## 2011-10-06 DIAGNOSIS — K5900 Constipation, unspecified: Secondary | ICD-10-CM

## 2011-10-06 MED ORDER — HYDROCORTISONE ACETATE 25 MG RE SUPP: RECTAL | Status: DC

## 2011-10-06 NOTE — Patient Instructions (Addendum)
Start one capful of Miralax twice a day until sofr bowel movement. Then go to once daily. High fiber diet.  Call us if rectal bleeding constipation persists. We made you an appointment with Dr Jarold Motto on 11-06-2011 at 11 AM.

## 2011-10-06 NOTE — Progress Notes (Signed)
[name] [id number] [birth year]   HISTORY OR PRESENT ILLNESS : Patient is a 61 year old female with multiple medical problems, on multiple medications including Coumadin for A-fib. Patient known to Dr. Jarold Motto for a history of GERD, status post fundoplication , diverticulosis, and a dysphasia without evidence for esophageal strictures. She was recently hospitalized for an ventral hernia repair with mesh and LOA. Postoperatively she developed an ileus for which we were consulted. After several days, ileus resolved and patient was discharged home. She was readmitted overnight for digoxin toxicity. Since hospital discharge patient has struggled with constipation but over the last 2-3 weeks she has become severely constipated. She has been having rectal bleeding with bowel movements though no bleeding in last couple of days. She tried OTC laxative but wasn't really helpful. No abdominal pain. No nausea.  Cardiology wanted her seen today for rectal bleeding. Her baseline hemoglobin is 13-14 though in late June she ranged from 10-11. Hemoglobin 10/02/11 was up to  11.6  Current Medications, Allergies, Past Medical History, Past Surgical History, Family History and Social History were reviewed in [employer] record.   PHYSICAL EXAMINATION : General:  Well developed  female in no acute distress Head: Normocephalic and atraumatic Eyes:  sclerae anicteric,conjunctive pink. Ears: Normal auditory acuity Neck: Supple, no masses.  Lungs: Clear throughout to auscultation Heart: Regular rate and rhythm Abdomen: Soft, nondistended, nontender. No masses or hepatomegaly noted. Normal bowel sounds Rectal: external and hemorrhoid hemorrhoids. Scant light brown stool in vault.  Musculoskeletal: Symmetrical with no gross deformities  Skin: No lesions on visible extremities Extremities: No edema or deformities noted Neurological: Oriented x 4, grossly nonfocal Cervical Nodes:  No  significant cervical adenopathy Psychological:  Alert and cooperative. Normal mood and affect  ASSESSMENT AND PLAN :  1. Constipation, likely medication induced. She consumes plenty of fiber. Begin Miralax twice daily, when bowels begin moving well then decrease to once daily dosing. Check TSH. Patient will call if constipation does not resolve.   2. Hemorrhoids, internal and external . Trial of anusol HC supp x 10 days.   3 Rectal bleeding, likely secondary to #2.  Patient will followup with Dr. Jarold Motto next month, or sooner if bleeding persists after hemorrhoidal treatment.   4. multiple medical problems as listed in past medical history.  5. chronic anticoagulation, on Coumadin. INR 2.0 on 10/05/11

## 2011-10-07 ENCOUNTER — Encounter: Payer: Self-pay | Admitting: Internal Medicine

## 2011-10-07 ENCOUNTER — Ambulatory Visit (INDEPENDENT_AMBULATORY_CARE_PROVIDER_SITE_OTHER): Payer: Medicare Other | Admitting: Internal Medicine

## 2011-10-07 VITALS — BP 103/63 | HR 101 | Temp 98.3°F | Ht 63.0 in | Wt 179.9 lb

## 2011-10-07 DIAGNOSIS — K219 Gastro-esophageal reflux disease without esophagitis: Secondary | ICD-10-CM | POA: Diagnosis not present

## 2011-10-07 DIAGNOSIS — Z599 Problem related to housing and economic circumstances, unspecified: Secondary | ICD-10-CM | POA: Insufficient documentation

## 2011-10-07 DIAGNOSIS — E119 Type 2 diabetes mellitus without complications: Secondary | ICD-10-CM | POA: Diagnosis not present

## 2011-10-07 DIAGNOSIS — Z5987 Material hardship due to limited financial resources, not elsewhere classified: Secondary | ICD-10-CM | POA: Diagnosis not present

## 2011-10-07 DIAGNOSIS — G8929 Other chronic pain: Secondary | ICD-10-CM

## 2011-10-07 DIAGNOSIS — M549 Dorsalgia, unspecified: Secondary | ICD-10-CM

## 2011-10-07 DIAGNOSIS — R42 Dizziness and giddiness: Secondary | ICD-10-CM

## 2011-10-07 DIAGNOSIS — K5900 Constipation, unspecified: Secondary | ICD-10-CM

## 2011-10-07 DIAGNOSIS — I10 Essential (primary) hypertension: Secondary | ICD-10-CM

## 2011-10-07 DIAGNOSIS — I4891 Unspecified atrial fibrillation: Secondary | ICD-10-CM

## 2011-10-07 DIAGNOSIS — E039 Hypothyroidism, unspecified: Secondary | ICD-10-CM | POA: Diagnosis not present

## 2011-10-07 DIAGNOSIS — Z598 Other problems related to housing and economic circumstances: Secondary | ICD-10-CM | POA: Diagnosis not present

## 2011-10-07 LAB — TSH: TSH: 0.075 u[IU]/mL — ABNORMAL LOW (ref 0.350–4.500)

## 2011-10-07 LAB — GLUCOSE, CAPILLARY: Glucose-Capillary: 92 mg/dL (ref 70–99)

## 2011-10-07 MED ORDER — HYDROCORTISONE ACETATE 25 MG RE SUPP: RECTAL | Status: DC

## 2011-10-07 NOTE — Patient Instructions (Addendum)
Return to clinic in 3-4 months Diabetes-stop Glipizide. Continue to take Metformin 1000 mg bid  Several medications that you may be taking may cause dizziness  Dizziness  Dizziness means you feel unsteady or lightheaded. You might feel like you are going to pass out (faint). HOME CARE   Drink enough fluids to keep your pee (urine) clear or pale yellow.   Take your medicines exactly as told by your doctor. If you take blood pressure medicine, always stand up slowly from the lying or sitting position. Hold on to something to steady yourself.   If you need to stand in one place for a long time, move your legs often. Tighten and relax your leg muscles.   Have someone stay with you until you feel okay.   Do not drive or use heavy machinery if you feel dizzy.   Do not drink alcohol.  GET HELP RIGHT AWAY IF:   You feel dizzy or lightheaded and it gets worse.   You feel sick to your stomach (nauseous), or you throw up (vomit).   You have trouble talking or walking.   You feel weak or have trouble using your arms, hands, or legs.   You cannot think clearly or have trouble forming sentences.   You have chest pain, belly (abdominal) pain, sweating, or you are short of breath.   Your vision changes.   You are bleeding.   You have problems from your medicine that seem to be getting worse.  MAKE SURE YOU:   Understand these instructions.   Will watch your condition.   Will get help right away if you are not doing well or get worse.  Document Released: 02/12/2011 Document Reviewed: 02/10/2011 [facility] Patient Information 2012 [place], [place].     Fiber Content in Foods Drinking plenty of fluids and consuming foods high in fiber can help with constipation. See the list below for the fiber content of some common foods. Starches and Grains / Dietary Fiber (g)  Cheerios, 1 cup / 3 g   Kellogg's Corn Flakes, 1 cup / 0.7 g   Rice Krispies, 1  cup / 0.3 g   Quaker Oat Life  Cereal,  cup / 2.1 g   Oatmeal, instant (cooked),  cup / 2 g   Kellogg's Frosted Mini Wheats, 1 cup / 5.1 g   Rice, brown, long-grain (cooked), 1 cup / 3.5 g   Rice, white, long-grain (cooked), 1 cup / 0.6 g   Macaroni, cooked, enriched, 1 cup / 2.5 g  Legumes / Dietary Fiber (g)  Beans, baked, canned, plain or vegetarian,  cup / 5.2 g   Beans, kidney, canned,  cup / 6.8 g   Beans, pinto, dried (cooked),  cup / 7.7 g   Beans, pinto, canned,  cup / 5.5 g  Breads and Crackers / Dietary Fiber (g)  Graham crackers, plain or honey, 2 squares / 0.7 g   Saltine crackers, 3 squares / 0.3 g   Pretzels, plain, salted, 10 pieces / 1.8 g   Bread, whole-wheat, 1 slice / 1.9 g   Bread, white, 1 slice / 0.7 g   Bread, raisin, 1 slice / 1.2 g   Bagel, plain, 3 oz / 2 g   Tortilla, flour, 1 oz / 0.9 g   Tortilla, corn, 1 small / 1.5 g   Bun, hamburger or hotdog, 1 small / 0.9 g  Fruits / Dietary Fiber (g)  Apple, raw with skin, 1 medium / 4.4 g  Applesauce, sweetened,  cup / 1.5 g   Banana,  medium / 1.5 g   Grapes, 10 grapes / 0.4 g   Orange, 1 small / 2.3 g   Raisin, 1.5 oz / 1.6 g   Melon, 1 cup / 1.4 g  Vegetables / Dietary Fiber (g)  Green beans, canned,  cup / 1.3 g   Carrots (cooked),  cup / 2.3 g   Broccoli (cooked),  cup / 2.8 g   Peas, frozen (cooked),  cup / 4.4 g   Potatoes, mashed,  cup / 1.6 g   Lettuce, 1 cup / 0.5 g   Corn, canned,  cup / 1.6 g   Tomato,  cup / 1.1 g  Document Released: 07/12/2006 Document Revised: 02/12/2011 Document Reviewed: 09/06/2006 [facility] Patient Information 2012 [place], [place].Constipation in Adults Constipation is having fewer than 2 bowel movements per week. Usually, the stools are hard. As we grow older, constipation is more common. If you try to fix constipation with laxatives, the problem may get worse. This is because laxatives taken over a long period of time make the colon muscles weaker. A  low-fiber diet, not taking in enough fluids, and taking some medicines may make these problems worse. MEDICATIONS THAT MAY CAUSE CONSTIPATION  Water pills (diuretics).   Calcium channel blockers (used to control blood pressure and for the heart).   Certain pain medicines (narcotics).   Anticholinergics.   Anti-inflammatory agents.   Antacids that contain aluminum.  DISEASES THAT CONTRIBUTE TO CONSTIPATION  Diabetes.   Parkinson's disease.   Dementia.   Stroke.   Depression.   Illnesses that cause problems with salt and water metabolism.  HOME CARE INSTRUCTIONS   Constipation is usually best cared for without medicines. Increasing dietary fiber and eating more fruits and vegetables is the best way to manage constipation.   Slowly increase fiber intake to 25 to 38 grams per day. Whole grains, fruits, vegetables, and legumes are good sources of fiber. A dietitian can further help you incorporate high-fiber foods into your diet.   Drink enough water and fluids to keep your urine clear or pale yellow.   A fiber supplement may be added to your diet if you cannot get enough fiber from foods.   Increasing your activities also helps improve regularity.   Suppositories, as suggested by your caregiver, will also help. If you are using antacids, such as aluminum or calcium containing products, it will be helpful to switch to products containing magnesium if your caregiver says it is okay.   If you have been given a liquid injection (enema) today, this is only a temporary measure. It should not be relied on for treatment of longstanding (chronic) constipation.   Stronger measures, such as magnesium sulfate, should be avoided if possible. This may cause uncontrollable diarrhea. Using magnesium sulfate may not allow you time to make it to the bathroom.  SEEK IMMEDIATE MEDICAL CARE IF:   There is bright red blood in the stool.   The constipation stays for more than 4 days.   There  is belly (abdominal) or rectal pain.   You do not seem to be getting better.   You have any questions or concerns.  MAKE SURE YOU:   Understand these instructions.   Will watch your condition.   Will get help right away if you are not doing well or get worse.  Document Released: 11/22/2003 Document Revised: 02/12/2011 Document Reviewed: 01/27/2011 [facility] Patient Information 2012 [place], [place].

## 2011-10-07 NOTE — Progress Notes (Signed)
Agree with initial assessment, plans, and followup 

## 2011-10-08 ENCOUNTER — Telehealth: Payer: Self-pay | Admitting: Licensed Clinical Social Worker

## 2011-10-08 NOTE — Telephone Encounter (Signed)
[name] was referred to CSW for information on affordable medications and financial difficulties.  CSW placed call to [name].  Pt is currently lives in [place] and receives disability and has Medicare.  Pt has inquired about Medicaid at some point but was told she would have a spend down.  CSW informed pt about [employer] to discuss Medicare D programs, Medicare Extra Help program, and applying for Adult Medicaid to inquire about the benefits of spend down vs specialty copays.  CSW will send [name] information in the mail.

## 2011-10-09 NOTE — Assessment & Plan Note (Signed)
Orthostatics negative May be induced by several medications i.e Amitriptyline, Neurontnin Advised pt medications may cause dizziness.

## 2011-10-09 NOTE — Assessment & Plan Note (Signed)
Pt reports 3-4/10 pain in the right area right of spine in the thoracic region w/o radiation.   Back pain present on exam with appearance of muscle spasm Pt has Flexeril and Vicodin at home which help Advised to be careful of sedation.

## 2011-10-09 NOTE — Assessment & Plan Note (Signed)
Evaluated by GI Noted to be 2/2 internal and external hemorrhoids No longer associated with rectal bleeding Pt to take Miralax bid then qd one bowel habits more regular Pt given Anusol suppositories Checked tsh per GI recommendations

## 2011-10-09 NOTE — Assessment & Plan Note (Signed)
Pt still having hypoglycemic episodes.  She was advised to decrease Glipizide 10 mg bid to 5 mg bid at last appt  Pt was still taking Glipizide 10 mg bid and she decreased Metformin 1000 mg bid to 500 mg bid  Plan D/c Glipizide. Continue Metformin 1000 mg bid

## 2011-10-09 NOTE — Assessment & Plan Note (Signed)
Check tsh Pt on syntroid 75 mcg to cont.

## 2011-10-09 NOTE — Telephone Encounter (Signed)
Pt lives in [place].  Letter with information mailed.

## 2011-10-09 NOTE — Progress Notes (Signed)
Subjective:    Patient ID: [name], female    DOB: 1951-01-13, 61 y.o.   MRN: [id number]  HPI Comments: Patient was evaluated 10/07/11 in clinic. 61 y.o woman significant PMH and PsuH.  Recently had complex ventral hernia incision repair and lysis of adhesions and follow up with Dr. Derrell Lolling.  She also recently had a hospital admission for digoxin toxicity.    She is doing well.  She reports she feels better.  She was seen in clinic approximately 1 week ago and referred to GI for bright red rectal bleeding.  Dr. Jarold Motto with GI saw her diagnosed her with constipation recommended she increased her fiber intake and start Miralax bid then decrease dose to qd when bowels move.  They noted external and internal hemorrhoids on flexible scope exam.  They also gave her Anusol suppositories to try.  They noted the rectal bleeding is likely secondary to hemorrhoids.  She denies noticing rectal bleeding.  Her last bowel movement was 10/06/11 (1 day prior to clinic).  She reports stools are not as painful to push out and not as hard as before.    DM: She reports she had a hypoglycemic episode at home where she felt lightheaded, weak, sweaty. When she checked her fsbs it was in the 70s.  She reports since last visit she had been taking Metformin 500 mg bid instead of 1000 mg bid and she had still been taking Glipizide 10 mg bid.  This was not the plan at her last visit.  She was to take Metformin 1000 mg bid and Glipizide dose was supposed to be decreased from 10 mg bid to 5 mg bid.    She denies dizziness initially with ROS but had positional dizziness when asked to sit up from a lying position on the exam table.    She c/o chronic intermittent back pain (right mid back) and takes Vicodin and Flexeril with relief.   SH: Pt receives widows disability.  She is going tomorrow to see if she qualifies for Medicaid as well.  She reports only having 0.94 cents until this Friday.  She does not have much food to eat  in her household only food she has previously canned.  She reports she is having trouble at times affording her medications.  She gets them from a local pharmacy other than [facility] or [facility].       Review of Systems  Constitutional: Positive for unexpected weight change.       Wt loss after recent surgery  Respiratory: Negative for shortness of breath.   Cardiovascular: Negative for chest pain and leg swelling.  Gastrointestinal:       Improved bowel habits Denies ab pain  Genitourinary: Negative for dysuria.       Denies rectal bleeding  Musculoskeletal: Positive for back pain.  Neurological: Positive for dizziness, weakness and light-headedness.       Objective:   Physical Exam  Nursing note and vitals reviewed. Constitutional: She is oriented to person, place, and time. She appears well-developed and well-nourished. She is cooperative. No distress.       Pleasant   HENT:  Head: Normocephalic and atraumatic.  Mouth/Throat: Oropharynx is clear and moist and mucous membranes are normal. No oropharyngeal exudate.  Eyes: Conjunctivae are normal. Pupils are equal, round, and reactive to light. No scleral icterus.  Cardiovascular: S1 normal, S2 normal and normal heart sounds.  An irregularly irregular rhythm present.  Pulmonary/Chest: Effort normal and breath sounds normal. No respiratory  distress. She has no wheezes.  Abdominal: Soft. Bowel sounds are normal. She exhibits no distension. There is no tenderness.         Linear scar to abdomen  Musculoskeletal:       Arms: Neurological: She is alert and oriented to person, place, and time. Gait normal.  Skin: Skin is warm, dry and intact. She is not diaphoretic.  Psychiatric: She has a normal mood and affect. Her speech is normal and behavior is normal.       Talkative           Assessment & Plan:  F/u 3-4 months

## 2011-10-09 NOTE — Assessment & Plan Note (Signed)
Pt having trouble affording medications and food Referred to social work for help Pt given food today Will try to cut down medication list w/ only necessary medications.  Advised pt to d/c niacin, mvt, K dur, Pravastatin for now.   Will review meds further in the future

## 2011-10-11 NOTE — Progress Notes (Signed)
agree

## 2011-10-12 ENCOUNTER — Telehealth: Payer: Self-pay | Admitting: Internal Medicine

## 2011-10-12 NOTE — Telephone Encounter (Signed)
Spoke with pt ed results tsh.  Advised we will see in 1-2 months to recheck tsh and free t4. She reports compliance with medication  Of note. Patient mentioned she needs Rx changed to #90 day supply b/c it is cheaper way to get more quantity of medication.  She needs new Rx for Diltiazem, Atenolol, Prilosec, Pravastatin.  She goes to [facility] in [place] [phone] pharmD [employer]

## 2011-10-14 ENCOUNTER — Other Ambulatory Visit: Payer: Self-pay | Admitting: Internal Medicine

## 2011-10-14 ENCOUNTER — Telehealth: Payer: Self-pay | Admitting: Internal Medicine

## 2011-10-14 DIAGNOSIS — I4891 Unspecified atrial fibrillation: Secondary | ICD-10-CM

## 2011-10-14 MED ORDER — DILTIAZEM HCL ER 180 MG PO CP24: 180.0000 mg | ORAL_CAPSULE | Freq: Every day | ORAL | Status: DC

## 2011-10-14 MED ORDER — OMEPRAZOLE 20 MG PO CPDR: 40.0000 mg | DELAYED_RELEASE_CAPSULE | ORAL | Status: DC | PRN

## 2011-10-14 MED ORDER — ATENOLOL 25 MG PO TABS: 37.5000 mg | ORAL_TABLET | Freq: Two times a day (BID) | ORAL | Status: DC

## 2011-10-14 NOTE — Addendum Note (Signed)
Addended by: Annett Gula on: 10/14/2011 05:21 PM   Modules accepted: Orders, Medications

## 2011-10-14 NOTE — Telephone Encounter (Signed)
Called [facility] [phone].  Spoke with patient. She needs 90 day supply to get her meds cheaper.  Called in  1)Diltiazem XR 180 mg qd #90 RF x 1 2) Atenolol 37.5 mg bid #270 RF x 1 3) Prilosec 40 mg #90 RF x 1 4) Will reevaluate her for Pravastatin and may call in later date  [facility]

## 2011-10-19 ENCOUNTER — Ambulatory Visit (INDEPENDENT_AMBULATORY_CARE_PROVIDER_SITE_OTHER): Payer: Medicare Other

## 2011-10-19 DIAGNOSIS — Z7901 Long term (current) use of anticoagulants: Secondary | ICD-10-CM

## 2011-10-19 DIAGNOSIS — I4891 Unspecified atrial fibrillation: Secondary | ICD-10-CM | POA: Diagnosis not present

## 2011-10-19 DIAGNOSIS — G459 Transient cerebral ischemic attack, unspecified: Secondary | ICD-10-CM | POA: Diagnosis not present

## 2011-10-19 LAB — POCT INR: INR: 1.8

## 2011-10-22 ENCOUNTER — Telehealth: Payer: Self-pay

## 2011-10-22 NOTE — Telephone Encounter (Signed)
Patient aware of monitor results 

## 2011-10-26 ENCOUNTER — Telehealth: Payer: Self-pay | Admitting: Internal Medicine

## 2011-10-26 NOTE — Telephone Encounter (Signed)
Received a notice from [facility] stating pt has not filled Lisinopril 10 mg qd.   This medication was dc'ed 07/2011 during a hospital admission where the patient had dig induced bradycardia and hypotension  Will review at next appt if pt needs Lisinopril added again  Desma Maxim MD

## 2011-10-29 ENCOUNTER — Ambulatory Visit: Payer: Medicare Other | Admitting: Cardiology

## 2011-11-06 ENCOUNTER — Ambulatory Visit: Payer: Medicare Other | Admitting: Gastroenterology

## 2011-11-09 ENCOUNTER — Encounter (HOSPITAL_COMMUNITY): Payer: Self-pay | Admitting: *Deleted

## 2011-11-09 ENCOUNTER — Emergency Department (HOSPITAL_COMMUNITY)
Admission: EM | Admit: 2011-11-09 | Discharge: 2011-11-09 | Disposition: A | Discharge status: Home / Self Care | Payer: Medicare Other | Attending: Emergency Medicine | Admitting: Emergency Medicine

## 2011-11-09 DIAGNOSIS — M549 Dorsalgia, unspecified: Secondary | ICD-10-CM | POA: Diagnosis not present

## 2011-11-09 DIAGNOSIS — M542 Cervicalgia: Secondary | ICD-10-CM | POA: Diagnosis not present

## 2011-11-09 LAB — POCT I-STAT, CHEM 8
BUN: 17 mg/dL (ref 6–23)
Calcium, Ion: 1.32 mmol/L — ABNORMAL HIGH (ref 1.13–1.30)
Chloride: 100 mEq/L (ref 96–112)
Creatinine, Ser: 1.1 mg/dL (ref 0.50–1.10)
Glucose, Bld: 117 mg/dL — ABNORMAL HIGH (ref 70–99)
HCT: 34 % — ABNORMAL LOW (ref 36.0–46.0)
Hemoglobin: 11.6 g/dL — ABNORMAL LOW (ref 12.0–15.0)
Potassium: 3.4 mEq/L — ABNORMAL LOW (ref 3.5–5.1)
Sodium: 142 mEq/L (ref 135–145)

## 2011-11-09 LAB — POCT I-STAT TROPONIN I: Troponin i, poc: 0.02 ng/mL (ref 0.00–0.08)

## 2011-11-09 LAB — PROTIME-INR: Prothrombin Time: 19.5 seconds — ABNORMAL HIGH (ref 11.6–15.2)

## 2011-11-09 NOTE — ED Notes (Addendum)
Pt to ED for eval of left side neck pain, mid-back pain, and left side pain. States this pain started after she started moving and packing boxes. Pain is tender to touch and any movement. Denies vomiting. Denies difficulty sleeping. Pt states she has hx of digoxin tox. Not on dig now.

## 2011-11-17 ENCOUNTER — Ambulatory Visit (INDEPENDENT_AMBULATORY_CARE_PROVIDER_SITE_OTHER): Payer: Medicare Other | Admitting: Gastroenterology

## 2011-11-17 ENCOUNTER — Encounter: Payer: Self-pay | Admitting: Gastroenterology

## 2011-11-17 VITALS — BP 100/60 | HR 64 | Ht 63.0 in | Wt 176.2 lb

## 2011-11-17 DIAGNOSIS — K5900 Constipation, unspecified: Secondary | ICD-10-CM

## 2011-11-17 DIAGNOSIS — K219 Gastro-esophageal reflux disease without esophagitis: Secondary | ICD-10-CM

## 2011-11-17 DIAGNOSIS — K5730 Diverticulosis of large intestine without perforation or abscess without bleeding: Secondary | ICD-10-CM | POA: Diagnosis not present

## 2011-11-17 NOTE — Patient Instructions (Addendum)
Your physician has requested that you go to the basement for the following lab work before leaving today: IFOB.  CC: SIDHU, Neta Mends

## 2011-11-17 NOTE — Progress Notes (Signed)
This is a very pleasant 61 year old Caucasian female with multiple medical problems who recently had ventral hernia surgery and developed mild constipation. Her last colonoscopy was 3 years ago which showed left colon diverticulosis. Some of her constipation was related to postop hydrocodone use. She currently is having regular bowel movements without melena, hematochezia, or lower nominal pain, and she is on daily Metamucil with liberal by mouth fluids. Patient is chronically anticoagulated on Coumadin and followed by cardiology and primary care at [facility].  Current Medications, Allergies, Past Medical History, Past Surgical History, Family History and Social History were reviewed in [employer] record.  Pertinent Review of Systems Negative   Physical Exam: Healthy-appearing patient in no distress. Blood pressure 100/60, pulse 64 and regular, weight 176 pounds with BMI of 31.21. Her abdomen shows a well-healed midline abdominal scar. There is no hepatosplenomegaly, abdominal masses, tenderness, or ascites. Bowel sounds are normal. Mental status is normal.    Assessment and Plan: Chronic functional constipation diverticulosis coli doing well on fiber supplements and liberal by mouth fluids. 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 We will continue her current therapy, and I asked her to send in IFOB stool cards. I also advised her to use hydrocodone as sparingly as possible for her fibromyalgia. Her Coumadin is managed by cardiology.

## 2011-11-24 ENCOUNTER — Encounter: Payer: Self-pay | Admitting: Gastroenterology

## 2011-11-26 ENCOUNTER — Encounter: Payer: Self-pay | Admitting: Cardiology

## 2011-11-26 ENCOUNTER — Ambulatory Visit (INDEPENDENT_AMBULATORY_CARE_PROVIDER_SITE_OTHER): Payer: Medicare Other | Admitting: Cardiology

## 2011-11-26 ENCOUNTER — Ambulatory Visit (INDEPENDENT_AMBULATORY_CARE_PROVIDER_SITE_OTHER): Payer: Medicare Other

## 2011-11-26 VITALS — BP 112/62 | HR 94 | Ht 63.0 in | Wt 176.8 lb

## 2011-11-26 DIAGNOSIS — I4891 Unspecified atrial fibrillation: Secondary | ICD-10-CM

## 2011-11-26 DIAGNOSIS — E785 Hyperlipidemia, unspecified: Secondary | ICD-10-CM | POA: Diagnosis not present

## 2011-11-26 DIAGNOSIS — I6529 Occlusion and stenosis of unspecified carotid artery: Secondary | ICD-10-CM | POA: Diagnosis not present

## 2011-11-26 DIAGNOSIS — Z7901 Long term (current) use of anticoagulants: Secondary | ICD-10-CM | POA: Diagnosis not present

## 2011-11-26 DIAGNOSIS — I10 Essential (primary) hypertension: Secondary | ICD-10-CM

## 2011-11-26 DIAGNOSIS — R42 Dizziness and giddiness: Secondary | ICD-10-CM

## 2011-11-26 DIAGNOSIS — G459 Transient cerebral ischemic attack, unspecified: Secondary | ICD-10-CM | POA: Diagnosis not present

## 2011-11-26 LAB — POCT INR: INR: 2.4

## 2011-11-26 NOTE — Patient Instructions (Addendum)
Please decrease Lasix to 20 mg a day Continue all other medications as listed.  Wear compression hose daily.  Follow up in month.

## 2011-11-26 NOTE — Progress Notes (Signed)
HPI She underwent incarcerated ventral hernia repair in 08/2011 complicated by postoperative ileus. She was then readmitted 6/22-6/25 with acute renal failure (peak creatinine 7.81) likely related to poor p.o. Intake in the setting of concomitant ACE inhibitor and Lasix therapy. Her renal failure resulted in digoxin toxicity with a digoxin level of 3.5 and her heart rate was down in the 40s in atrial fibrillation.   She presents today for followup as she has had dizziness and actual falls related to this. This seems to happen when she standing or has gone from a seated or lying to a standing position. She's not completely lost consciousness.  She does feel her atrial fibrillation and sometimes she feels that it is rapid. She denies any chest pressure, neck or arm discomfort. She denies any new shortness of breath, PND or orthopnea. We did check her orthostatics in the office and they fell as recorded below.    Allergies  Allergen Reactions  . Gemfibrozil Swelling    REACTION: Angioedema  . Latex Other (See Comments)    Blisters where touched or applied  . Penicillins Hives    Will spread in patches all over the body.  . Adhesive (Tape) Other (See Comments)    Will blister skin where applied - do not use BAND-AIDS.  Marland Kitchen Codeine Hives    Will spread in patches all over the body.  . Digoxin And Related     Makes BP drop    Current Outpatient Prescriptions  Medication Sig Dispense Refill  . albuterol (PROVENTIL HFA;VENTOLIN HFA) 108 (90 BASE) MCG/ACT inhaler Inhale 2 puffs into the lungs every 6 (six) hours as needed. For shortness of breath      . ALPRAZolam (XANAX) 0.5 MG tablet Take 0.5 mg by mouth at bedtime as needed. For anxiety/sleep.      Marland Kitchen amitriptyline (ELAVIL) 50 MG tablet Take 50 mg by mouth at bedtime.      Marland Kitchen atenolol (TENORMIN) 25 MG tablet Take 37.5 mg by mouth 2 (two) times daily.      . Calcium Carbonate-Vitamin D (CALCIUM 600+D) 600-400 MG-UNIT per tablet Take 1 tablet by  mouth 2 (two) times daily with a meal.       . cyclobenzaprine (FLEXERIL) 5 MG tablet Take 5 mg by mouth 2 (two) times daily as needed. For fibromyalgia      . diltiazem (DILACOR XR) 180 MG 24 hr capsule Take 180 mg by mouth daily.      Marland Kitchen FLUoxetine (PROZAC) 20 MG tablet Take 40 mg by mouth daily.      . furosemide (LASIX) 40 MG tablet Take 40 mg by mouth every morning.       . gabapentin (NEURONTIN) 100 MG capsule Take 100 mg by mouth 2 (two) times daily.      Marland Kitchen HYDROcodone-acetaminophen (VICODIN) 5-500 MG per tablet Take 1 tablet by mouth every 6 (six) hours as needed. For pain.      Marland Kitchen levothyroxine (SYNTHROID, LEVOTHROID) 75 MCG tablet Take 75 mcg by mouth every morning.      . metFORMIN (GLUCOPHAGE) 500 MG tablet Take 1,000 mg by mouth 2 (two) times daily with a meal.      . omeprazole (PRILOSEC) 20 MG capsule Take 20 mg by mouth daily.      . psyllium (HYDROCIL/METAMUCIL) 95 % PACK Take 1 packet by mouth every other day.      . warfarin (COUMADIN) 2.5 MG tablet Take 2.5-5 mg by mouth daily. Take 5 MG on  Monday, Wednesday, and Friday. Take 2.5 MG on Tuesday, Thursday, Saturday, and Sunday.      Marland Kitchen DISCONTD: albuterol (PROVENTIL HFA;VENTOLIN HFA) 108 (90 BASE) MCG/ACT inhaler Inhale 2 puffs into the lungs every 6 (six) hours as needed. For shortness of breath  3 Inhaler  3    Past Medical History  Diagnosis Date  . Depression   . Obesity   . Skin neoplasm   . Bunion   . Carotid stenosis   . Fibromyalgia   . Internal hemorrhoid   . GERD (gastroesophageal reflux disease)   . Chronic gastritis   . Transaminase or LDH elevation   . Mild cognitive impairment   . Hyperlipidemia   . Fatty liver   . Lumbar back pain   . Diabetic peripheral neuropathy   . Diverticulosis   . Dysphagia     no documented strictures but responded positively to dilation in past.   . Hypertension   . Cholecystitis     s/p cholecystectomy  . Sarcoidosis   . Fibromyalgia   . CHF (congestive heart failure)     . Skin cancer 1990's    "front of my right leg"  . Atrial fibrillation   . OSA (obstructive sleep apnea) 2003    "can't sleep w/that darm machine"  . Exertional dyspnea   . Hypothyroidism   . Type II diabetes mellitus   . TIA (transient ischemic attack)     07/29/11 pt denies this history  . H/O hiatal hernia   . Epileptic seizure, tonic     .No meds since age of 67.  . Osteoarthritis   . Stroke     tia  . Hypothyroid   . Diverticulosis   . Digoxin toxicity     08/2011  . Bradycardia, drug induced     08/2011 (dig)  . Drug-induced hypotension     08/2011 (Dig)  . Fibromyalgia   . Hemorrhoids     Past Surgical History  Procedure Date  . Hiatal hernia repair 1990    "had to have scar tissue removed 6 months after repair"  . Esophageal dilation   . Cataract extraction w/phaco 10/27/2010    Procedure: CATARACT EXTRACTION PHACO AND INTRAOCULAR LENS PLACEMENT (IOC);  Surgeon: Susa Simmonds;  Location: [facility];  Service: Ophthalmology;  Laterality: Left;  CDE- 1.78  . Cataract extraction w/phaco 07/13/2011    Procedure: CATARACT EXTRACTION PHACO AND INTRAOCULAR LENS PLACEMENT (IOC);  Surgeon: Susa Simmonds, MD;  Location: [facility];  Service: Ophthalmology;  Laterality: Right;  CDE:  1.65  . Liposuction 1992  . Inverted nipples 1992  . Breast surgery ~ 2010    excision milk duct; right breast  . Foot surgery ~ 2011    ~straightened toe left foot & scraped bone below big toe"  . Foot surgery ~ 2011    "scraped bone of big toe; shortened middle toe; right foot"  . Cholecystectomy 1980's  . Dilation and curettage of uterus 1970; 1976  . Abdominal hysterectomy 1980's    partial  . Tubal ligation 1976  . Bilateral oophorectomy 1980's    "after partial hysterectomy"  . Skin cancer excision 1990's    "front side of right shin"  . Ventral hernia repair 07/29/2011    Procedure: LAPAROSCOPIC VENTRAL HERNIA;  Surgeon: Ernestene Mention, MD;  Location: [facility];  Service: General;   Laterality: N/A;  multiple incarcerated hernias with mesh  . Other surgical history     mutiple ab surgeries for  abdominal hernia  . Other surgical history     multiple dilation of esophagus  . Other surgical history     right lens implant 07/2011, left lens implant 10/2010  . Other surgical history     right breast bx=benign    ROS: As stated in the HPI and negative for all other systems.   PHYSICAL EXAM BP 113/60  Pulse 103  Ht 5\' 3"  (1.6 m)  Wt 176 lb 12.8 oz (80.196 kg)  BMI 31.32 kg/m2  LMP 06/03/1978 GENERAL:  Well appearing HEENT:  Pupils equal round and reactive, fundi not visualized, oral mucosa unremarkable NECK:  No jugular venous distention, waveform within normal limits, carotid upstroke brisk and symmetric, no bruits, no thyromegaly LYMPHATICS:  No cervical, inguinal adenopathy LUNGS:  Clear to auscultation bilaterally BACK:  No CVA tenderness CHEST:  Unremarkable HEART:  PMI not displaced or sustained,S1 and S2 within normal limits, no S3, no S4, no clicks, no rubs, no murmurs, irregular ABD:  Flat, positive bowel sounds normal in frequency in pitch, no bruits, no rebound, no guarding, no midline pulsatile mass, no hepatomegaly, no splenomegaly EXT:  2 plus pulses throughout, no edema, no cyanosis no clubbing SKIN:  No rashes no nodules NEURO:  Cranial nerves II through XII grossly intact, motor grossly intact throughout PSYCH:  Cognitively intact, oriented to person place and time  EKG:  Atrial fibrillation, rate 60s , axis within normal limits, intervals within normal limits, no acute ischemic changes.  ASSESSMENT AND PLAN  DIZZINESS -  This is almost definitely related to her orthostasis. I'm going to reduce her Lasix to 20 mg daily. I'm going to have her wear compression stockings.  ATRIAL FIBRILLATION -  She tolerates this rhythm and rate control and anticoagulation. She had reasonable rate control on a Holter monitor this summer.   CAROTID STENOSIS -    She has 30- 59% stenosis on the right and less 0 - 29% on the left last Nov.  Shewill have follow up Dopplers in November  HYPERTENSION -  She will continue on the other medications as listed.   HYPOKALEMIA - She will have a BMET today as her potassium was slightly low in August.

## 2011-12-24 ENCOUNTER — Ambulatory Visit (INDEPENDENT_AMBULATORY_CARE_PROVIDER_SITE_OTHER): Payer: Medicare Other | Admitting: Pharmacist

## 2011-12-24 ENCOUNTER — Ambulatory Visit (INDEPENDENT_AMBULATORY_CARE_PROVIDER_SITE_OTHER): Payer: Medicare Other | Admitting: Cardiology

## 2011-12-24 ENCOUNTER — Encounter: Payer: Self-pay | Admitting: Cardiology

## 2011-12-24 VITALS — BP 111/61 | HR 74 | Ht 63.0 in | Wt 180.1 lb

## 2011-12-24 DIAGNOSIS — I6529 Occlusion and stenosis of unspecified carotid artery: Secondary | ICD-10-CM

## 2011-12-24 DIAGNOSIS — Z7901 Long term (current) use of anticoagulants: Secondary | ICD-10-CM

## 2011-12-24 DIAGNOSIS — I4891 Unspecified atrial fibrillation: Secondary | ICD-10-CM | POA: Diagnosis not present

## 2011-12-24 DIAGNOSIS — G459 Transient cerebral ischemic attack, unspecified: Secondary | ICD-10-CM | POA: Diagnosis not present

## 2011-12-24 LAB — POCT INR: INR: 2.4

## 2011-12-24 NOTE — Progress Notes (Signed)
HPI She underwent incarcerated ventral hernia repair in 08/2011 complicated by postoperative ileus. She was then readmitted 6/22-6/25 with acute renal failure (peak creatinine 7.81) likely related to poor p.o. Intake in the setting of concomitant ACE inhibitor and Lasix therapy. Her renal failure resulted in digoxin toxicity with a digoxin level of 3.5 and her heart rate was down in the 40s in atrial fibrillation.   I saw her last month and she was having dizziness and orthostasis.  At that time I reduced her Lasix.  Since that time she has done very well.  She has had no further lightheadedness, presyncope or syncope. She denies any chest pain.  She has had no palpitations and no SOB.   She is walking for exercise.   Allergies  Allergen Reactions  . Gemfibrozil Swelling    REACTION: Angioedema  . Latex Other (See Comments)    Blisters where touched or applied  . Penicillins Hives    Will spread in patches all over the body.  . Adhesive (Tape) Other (See Comments)    Will blister skin where applied - do not use BAND-AIDS.  Marland Kitchen Codeine Hives    Will spread in patches all over the body.  . Digoxin And Related     Makes BP drop    Current Outpatient Prescriptions  Medication Sig Dispense Refill  . albuterol (PROVENTIL HFA;VENTOLIN HFA) 108 (90 BASE) MCG/ACT inhaler Inhale 2 puffs into the lungs every 6 (six) hours as needed. For shortness of breath      . ALPRAZolam (XANAX) 0.5 MG tablet Take 0.5 mg by mouth at bedtime as needed. For anxiety/sleep.      Marland Kitchen amitriptyline (ELAVIL) 50 MG tablet Take 50 mg by mouth at bedtime.      Marland Kitchen atenolol (TENORMIN) 25 MG tablet Take 37.5 mg by mouth 2 (two) times daily.      . Calcium Carbonate-Vitamin D (CALCIUM 600+D) 600-400 MG-UNIT per tablet Take 1 tablet by mouth 2 (two) times daily with a meal.       . cyclobenzaprine (FLEXERIL) 5 MG tablet Take 5 mg by mouth 2 (two) times daily as needed. For fibromyalgia      . diltiazem (DILACOR XR) 180 MG 24  hr capsule Take 180 mg by mouth daily.      Marland Kitchen FLUoxetine (PROZAC) 20 MG tablet Take 40 mg by mouth daily.      . furosemide (LASIX) 40 MG tablet Take 40 mg by mouth every morning.       . gabapentin (NEURONTIN) 100 MG capsule Take 100 mg by mouth 2 (two) times daily.      Marland Kitchen HYDROcodone-acetaminophen (VICODIN) 5-500 MG per tablet Take 1 tablet by mouth every 6 (six) hours as needed. For pain.      Marland Kitchen levothyroxine (SYNTHROID, LEVOTHROID) 75 MCG tablet Take 75 mcg by mouth every morning.      Marland Kitchen lisinopril (PRINIVIL,ZESTRIL) 10 MG tablet       . metFORMIN (GLUCOPHAGE) 500 MG tablet Take 1,000 mg by mouth 2 (two) times daily with a meal.      . omeprazole (PRILOSEC) 20 MG capsule Take 20 mg by mouth daily.      . psyllium (HYDROCIL/METAMUCIL) 95 % PACK Take 1 packet by mouth every other day.      . warfarin (COUMADIN) 2.5 MG tablet Take 2.5-5 mg by mouth daily. Take 5 MG on Monday, Wednesday, and Friday. Take 2.5 MG on Tuesday, Thursday, Saturday, and Sunday.      Marland Kitchen  DISCONTD: albuterol (PROVENTIL HFA;VENTOLIN HFA) 108 (90 BASE) MCG/ACT inhaler Inhale 2 puffs into the lungs every 6 (six) hours as needed. For shortness of breath  3 Inhaler  3    Past Medical History  Diagnosis Date  . Depression   . Obesity   . Skin neoplasm   . Bunion   . Carotid stenosis   . Fibromyalgia   . Internal hemorrhoid   . GERD (gastroesophageal reflux disease)   . Chronic gastritis   . Transaminase or LDH elevation   . Mild cognitive impairment   . Hyperlipidemia   . Fatty liver   . Lumbar back pain   . Diabetic peripheral neuropathy   . Diverticulosis   . Dysphagia     no documented strictures but responded positively to dilation in past.   . Hypertension   . Cholecystitis     s/p cholecystectomy  . Sarcoidosis   . Fibromyalgia   . CHF (congestive heart failure)   . Skin cancer 1990's    "front of my right leg"  . Atrial fibrillation   . OSA (obstructive sleep apnea) 2003    "can't sleep w/that darm  machine"  . Exertional dyspnea   . Hypothyroidism   . Type II diabetes mellitus   . TIA (transient ischemic attack)     07/29/11 pt denies this history  . H/O hiatal hernia   . Epileptic seizure, tonic     .No meds since age of 85.  . Osteoarthritis   . Stroke     tia  . Hypothyroid   . Diverticulosis   . Digoxin toxicity     08/2011  . Bradycardia, drug induced     08/2011 (dig)  . Drug-induced hypotension     08/2011 (Dig)  . Fibromyalgia   . Hemorrhoids     Past Surgical History  Procedure Date  . Hiatal hernia repair 1990    "had to have scar tissue removed 6 months after repair"  . Esophageal dilation   . Cataract extraction w/phaco 10/27/2010    Procedure: CATARACT EXTRACTION PHACO AND INTRAOCULAR LENS PLACEMENT (IOC);  Surgeon: Susa Simmonds;  Location: [facility];  Service: Ophthalmology;  Laterality: Left;  CDE- 1.78  . Cataract extraction w/phaco 07/13/2011    Procedure: CATARACT EXTRACTION PHACO AND INTRAOCULAR LENS PLACEMENT (IOC);  Surgeon: Susa Simmonds, MD;  Location: [facility];  Service: Ophthalmology;  Laterality: Right;  CDE:  1.65  . Liposuction 1992  . Inverted nipples 1992  . Breast surgery ~ 2010    excision milk duct; right breast  . Foot surgery ~ 2011    ~straightened toe left foot & scraped bone below big toe"  . Foot surgery ~ 2011    "scraped bone of big toe; shortened middle toe; right foot"  . Cholecystectomy 1980's  . Dilation and curettage of uterus 1970; 1976  . Abdominal hysterectomy 1980's    partial  . Tubal ligation 1976  . Bilateral oophorectomy 1980's    "after partial hysterectomy"  . Skin cancer excision 1990's    "front side of right shin"  . Ventral hernia repair 07/29/2011    Procedure: LAPAROSCOPIC VENTRAL HERNIA;  Surgeon: Ernestene Mention, MD;  Location: [facility] OR;  Service: General;  Laterality: N/A;  multiple incarcerated hernias with mesh  . Other surgical history     mutiple ab surgeries for abdominal hernia  . Other surgical  history     multiple dilation of esophagus  . Other surgical history  right lens implant 07/2011, left lens implant 10/2010  . Other surgical history     right breast bx=benign    ROS: As stated in the HPI and negative for all other systems.   PHYSICAL EXAM BP 111/61  Pulse 74  Ht 5\' 3"  (1.6 m)  Wt 180 lb 1.9 oz (81.702 kg)  BMI 31.91 kg/m2  LMP 06/03/1978 GENERAL:  Well appearing HEENT:  Pupils equal round and reactive, fundi not visualized, oral mucosa unremarkable NECK:  No jugular venous distention, waveform within normal limits, carotid upstroke brisk and symmetric, no bruits, no thyromegaly LUNGS:  Clear to auscultation bilaterally BACK:  No CVA tenderness HEART:  PMI not displaced or sustained,S1 and S2 within normal limits, no S3, no S4, no clicks, no rubs, no murmurs, irregular ABD:  Flat, positive bowel sounds normal in frequency in pitch, no bruits, no rebound, no guarding, no midline pulsatile mass, no hepatomegaly, no splenomegaly EXT:  2 plus pulses throughout, no edema, no cyanosis no clubbing   ASSESSMENT AND PLAN  DIZZINESS -  She feels much better and these symptoms have resolved.  No change in therapy is indicated.    ATRIAL FIBRILLATION -  She tolerates this rhythm and rate control and anticoagulation.  No change in therapy is indicated.   CAROTID STENOSIS -  She has 30- 59% stenosis on the right and less 0 - 29% on the left last Nov.  Shewill have follow up Dopplers next month  HYPERTENSION -  She will continue on the other medications as listed.

## 2011-12-24 NOTE — Patient Instructions (Addendum)
The current medical regimen is effective;  continue present plan and medications.  Your physician has requested that you have a carotid duplex. This test is an ultrasound of the carotid arteries in your neck. It looks at blood flow through these arteries that supply the brain with blood. Allow one hour for this exam. There are no restrictions or special instructions.  Follow up in 6 months with Dr Hochrein.  You will receive a letter in the mail 2 months before you are due.  Please call us when you receive this letter to schedule your follow up appointment.  

## 2012-01-11 ENCOUNTER — Other Ambulatory Visit: Payer: Self-pay | Admitting: Cardiology

## 2012-01-11 DIAGNOSIS — I6529 Occlusion and stenosis of unspecified carotid artery: Secondary | ICD-10-CM

## 2012-01-18 ENCOUNTER — Ambulatory Visit (HOSPITAL_COMMUNITY)
Admission: RE | Admit: 2012-01-18 | Discharge: 2012-01-18 | Disposition: A | Discharge status: Home / Self Care | Payer: Medicare Other | Source: Ambulatory Visit | Attending: Internal Medicine | Admitting: Internal Medicine

## 2012-01-18 ENCOUNTER — Ambulatory Visit (INDEPENDENT_AMBULATORY_CARE_PROVIDER_SITE_OTHER): Payer: Medicare Other | Admitting: Internal Medicine

## 2012-01-18 ENCOUNTER — Encounter: Payer: Self-pay | Admitting: Internal Medicine

## 2012-01-18 ENCOUNTER — Other Ambulatory Visit: Payer: Self-pay | Admitting: Internal Medicine

## 2012-01-18 VITALS — BP 120/58 | HR 92 | Temp 99.4°F | Ht 63.0 in | Wt 187.1 lb

## 2012-01-18 DIAGNOSIS — E1149 Type 2 diabetes mellitus with other diabetic neurological complication: Secondary | ICD-10-CM | POA: Diagnosis not present

## 2012-01-18 DIAGNOSIS — Z1231 Encounter for screening mammogram for malignant neoplasm of breast: Secondary | ICD-10-CM

## 2012-01-18 DIAGNOSIS — E785 Hyperlipidemia, unspecified: Secondary | ICD-10-CM

## 2012-01-18 DIAGNOSIS — F32A Depression, unspecified: Secondary | ICD-10-CM

## 2012-01-18 DIAGNOSIS — R300 Dysuria: Secondary | ICD-10-CM | POA: Insufficient documentation

## 2012-01-18 DIAGNOSIS — E119 Type 2 diabetes mellitus without complications: Secondary | ICD-10-CM | POA: Diagnosis not present

## 2012-01-18 DIAGNOSIS — Z0000 Encounter for general adult medical examination without abnormal findings: Secondary | ICD-10-CM

## 2012-01-18 DIAGNOSIS — Z23 Encounter for immunization: Secondary | ICD-10-CM | POA: Diagnosis not present

## 2012-01-18 DIAGNOSIS — Z5989 Other problems related to housing and economic circumstances: Secondary | ICD-10-CM | POA: Diagnosis not present

## 2012-01-18 DIAGNOSIS — Z5987 Material hardship due to limited financial resources, not elsewhere classified: Secondary | ICD-10-CM | POA: Diagnosis not present

## 2012-01-18 DIAGNOSIS — R51 Headache: Secondary | ICD-10-CM

## 2012-01-18 DIAGNOSIS — M549 Dorsalgia, unspecified: Secondary | ICD-10-CM

## 2012-01-18 DIAGNOSIS — Z598 Other problems related to housing and economic circumstances: Secondary | ICD-10-CM

## 2012-01-18 DIAGNOSIS — I10 Essential (primary) hypertension: Secondary | ICD-10-CM | POA: Diagnosis not present

## 2012-01-18 DIAGNOSIS — I4891 Unspecified atrial fibrillation: Secondary | ICD-10-CM

## 2012-01-18 DIAGNOSIS — F329 Major depressive disorder, single episode, unspecified: Secondary | ICD-10-CM | POA: Diagnosis not present

## 2012-01-18 DIAGNOSIS — G8929 Other chronic pain: Secondary | ICD-10-CM | POA: Diagnosis not present

## 2012-01-18 DIAGNOSIS — F3289 Other specified depressive episodes: Secondary | ICD-10-CM

## 2012-01-18 DIAGNOSIS — E039 Hypothyroidism, unspecified: Secondary | ICD-10-CM | POA: Diagnosis not present

## 2012-01-18 DIAGNOSIS — Z599 Problem related to housing and economic circumstances, unspecified: Secondary | ICD-10-CM

## 2012-01-18 DIAGNOSIS — E876 Hypokalemia: Secondary | ICD-10-CM

## 2012-01-18 LAB — GLUCOSE, CAPILLARY: Glucose-Capillary: 95 mg/dL (ref 70–99)

## 2012-01-18 LAB — POCT GLYCOSYLATED HEMOGLOBIN (HGB A1C): Hemoglobin A1C: 6

## 2012-01-18 MED ORDER — FUROSEMIDE 20 MG PO TABS: 10.0000 mg | ORAL_TABLET | Freq: Two times a day (BID) | ORAL | Status: DC

## 2012-01-18 MED ORDER — PRAVASTATIN SODIUM 40 MG PO TABS: 40.0000 mg | ORAL_TABLET | Freq: Every day | ORAL | Status: DC

## 2012-01-18 MED ORDER — PRAVASTATIN SODIUM 20 MG PO TABS: 20.0000 mg | ORAL_TABLET | Freq: Every evening | ORAL | Status: DC

## 2012-01-18 MED ORDER — GABAPENTIN 100 MG PO CAPS: 100.0000 mg | ORAL_CAPSULE | Freq: Three times a day (TID) | ORAL | Status: DC

## 2012-01-18 MED ORDER — FLUOXETINE HCL 20 MG PO TABS: 20.0000 mg | ORAL_TABLET | Freq: Every day | ORAL | Status: DC

## 2012-01-18 MED ORDER — METFORMIN HCL 500 MG PO TABS: 500.0000 mg | ORAL_TABLET | Freq: Two times a day (BID) | ORAL | Status: DC

## 2012-01-18 MED ORDER — HYDROCODONE-ACETAMINOPHEN 5-325MG PREPACK (~~LOC~~: 1.0000 | ORAL_TABLET | Freq: Four times a day (QID) | ORAL | Status: DC | PRN

## 2012-01-18 NOTE — Assessment & Plan Note (Signed)
Chronic AF on Coumadin and following with Cardiology INR at goal 2.4 12/24/11

## 2012-01-18 NOTE — Assessment & Plan Note (Signed)
tsh 0.075 09/2011 will repeat tsh and free T4 today Current dose Synthroid 75 mcg Will possibly adjust dose pending labs

## 2012-01-18 NOTE — Assessment & Plan Note (Signed)
Currently resolved but noted 2 weeks ago.  Will obtain UA for good measure

## 2012-01-18 NOTE — Assessment & Plan Note (Signed)
Pt taking K 10 meq Will repeat BMP today

## 2012-01-18 NOTE — Progress Notes (Signed)
Subjective:    Patient ID: [name], female    DOB: 02-05-1951, 61 y.o.   MRN: [id number]  HPI Comments: 61 year old woman significant PMH DM 2 (HA1C 6.3 07/2011; Creatinine Albumin ratio 16.4; HA1C 6.0 today with fsbs 95) associated with peripheral neuropathy, hypothyroidism (tsh 0.075 09/2011), HLD (TC 156, TG 278, HDL 45, LDL 55), HTN (BP 120/58 today), chronic Atrial fibrillation on Coumadin (follows with Cardiology for dosing; INR 2.4 12/2011, HR today 92), h/o diverticulosis and hemmrhoids noted colonoscopy 10/2008 w/o polyps, h/o depression, history of chronic pain (back, joints), h/o complicated ventral hernia repair 08/2011 complicated by ileus post op, fibromyalgia, GERD, h/o hypokalemia, carotid stenosis (US carotids shows 30-59% stenosis right, 0-29% stenosis left). See other PMH, [facility] as well.    She reports headache new for the past 2-3 weeks.  She had a h/a last week for 4-5 days and had a terrible h/a yesterday.  Distribution is bitemporal radiating to her neck.  She tried Vicodin 5-500 without relief.  Nothing helps.  10/10 today aching pain.  She denies visual changes.  Stress is a trigger and she has financial stress being on a fixed income, having to pay medical bills and still have money to eat.    She presents for follow up needing medication refills of Neurontin 100 mg tid.  She is on the 5-500 of Vicodin and the dose of acetaminophen needs to be changed to 325 mg.    She reports dysuria 2 weeks ago which is now resolved.    She denies suicidal ideation but states "I know why people do".     SH: She is on a fixed income and having financial difficulties.  She lives alone in [place].  She has 2 kids, 1 daughter and 1 son.    HM: She is agreeable to flu shot today and had a mammogram today.    Headache  This is a new problem. The current episode started 1 to 4 weeks ago. The problem occurs intermittently. The problem has been waxing and waning. The pain is located in the  bilateral region. The pain radiates to the right neck and left neck. The quality of the pain is described as aching. The pain is at a severity of 10/10. Associated symptoms include neck pain. Pertinent negatives include no abdominal pain, blurred vision, dizziness, photophobia or visual change. Associated symptoms comments: Ears popping. Nothing aggravates the symptoms. She has tried oral narcotics (Vicodin) for the symptoms. The treatment provided no relief. Her past medical history is significant for hypertension.      Review of Systems  HENT: Positive for neck pain.        Ear popping  Eyes: Negative for blurred vision, photophobia and visual disturbance.  Respiratory: Negative for shortness of breath.        Yellow sputum intermittently  Cardiovascular: Positive for palpitations. Negative for chest pain and leg swelling.       Palpitations noted with Afib intermittently  Gastrointestinal: Negative for abdominal pain and blood in stool.  Genitourinary: Negative for dysuria.  Musculoskeletal: Negative for gait problem.  Neurological: Positive for headaches. Negative for dizziness.  Psychiatric/Behavioral: Negative for suicidal ideas.       Increased stressors (financial)        Objective:   Physical Exam  Nursing note and vitals reviewed. Constitutional: She is oriented to person, place, and time. She appears well-developed and well-nourished. She is cooperative. No distress.       Pleasant,  talkative   HENT:  Head: Normocephalic and atraumatic.  Mouth/Throat: Oropharynx is clear and moist. Mucous membranes are not dry. Abnormal dentition. No oropharyngeal exudate.  Eyes: Conjunctivae normal are normal. Pupils are equal, round, and reactive to light. Right eye exhibits no discharge. Left eye exhibits no discharge. No scleral icterus.  Cardiovascular: An irregularly irregular rhythm present. Tachycardia present.        Slightly tachycardic irreg rhythm with h/o AF  Pulmonary/Chest:  Effort normal and breath sounds normal. No respiratory distress. She has no wheezes.  Abdominal: Soft. Bowel sounds are normal. She exhibits no distension. There is no tenderness.         Obese ab  Neurological: She is alert and oriented to person, place, and time. She has normal strength. No cranial nerve deficit or sensory deficit. Gait normal.       CN 2-12 grossly intact, no motor deficits  Skin: Skin is warm and dry. No rash noted. She is not diaphoretic.  Psychiatric: Her speech is normal and behavior is normal. Judgment and thought content normal. Her mood appears anxious. She exhibits a depressed mood.       Increased anxiety about financial stressors          Assessment & Plan:  Follow up 1 month if needed otherwise 3-4 months

## 2012-01-18 NOTE — Assessment & Plan Note (Signed)
Denies SI Cont taking Prozac 20 mg qd  Associated with anxiety prn Xanax 0.5 mg

## 2012-01-18 NOTE — Assessment & Plan Note (Signed)
Changed Vicodin 5-500 mg prn to 5-325 mg prn

## 2012-01-18 NOTE — Assessment & Plan Note (Signed)
BP controlled today. 120/58 Cont. Atenolol 37.5 bid, Diltiazem 180 mg 24 hr, Lasix 40 mg recently decreased to 10 mg per cardiology, Lisinopril 10 mg qd

## 2012-01-18 NOTE — Assessment & Plan Note (Signed)
HA1C 6.0 today, fsbs 95 Decreased Metformin 1000 mg bid to 500 mg bid

## 2012-01-18 NOTE — Assessment & Plan Note (Signed)
Bitemporal distribution radiating to neck.  Stress appears to be a trigger.  This may be a tension h/a.   Plan She is to continue Vicodin prn as it has Acetaminophen which is a treatment.  Avoid NSAIDS due to interaction with Coumadin; pt given info to read about headaches; RTC 1 month if not resolved, sooner if needed.

## 2012-01-18 NOTE — Assessment & Plan Note (Addendum)
Lipid Panel     Component Value Date/Time   CHOL 156 10/02/2011 1257   TRIG 278* 10/02/2011 1257   HDL 45 10/02/2011 1257   CHOLHDL 3.5 10/02/2011 1257   VLDL 56* 10/02/2011 1257   LDLCALC 55 10/02/2011 1257   LDL at goal Will decreased Pravachol 80 mg qhs to 40 mg qhs TG are still elevated 278

## 2012-01-18 NOTE — Assessment & Plan Note (Signed)
Rx refill Neurontin 100 mg tid prn

## 2012-01-18 NOTE — Assessment & Plan Note (Signed)
Mammogram obtained today Flu vx given today

## 2012-01-18 NOTE — Patient Instructions (Addendum)
Please decrease your Metformin to 500 mg twice a day Please note change in Acetaminophen dose 500 mg to 325 mg daily  Follow up in 1 month if needed for headache otherwise 3-4 months   Tension Headache A tension headache is pain, pressure, or aching felt over the front and sides of the head. Tension headaches often come after stress, feeling worried (anxiety), or feeling sad or down for a while (depressed). HOME CARE  Only take medicine as told by your doctor.  Lie down in a dark, quiet room when you have a headache.  Keep a journal to find out if certain things bring on headaches. For example, write down:  What you eat and drink.  How much sleep you get.  Any change to your diet or medicines.  Relax by getting a massage or doing other relaxing activities.  Put ice or heat packs on the head and neck area as told by your doctor.  Lessen stress.  Sit up straight. Do not tighten (tense) your muscles.  Quit smoking if you smoke.  Lessen how much alcohol you drink.  Lessen how much caffeine you drink, or stop drinking caffeine.  Eat and exercise regularly.  Get enough sleep.  Avoid using too much pain medicine. GET HELP RIGHT AWAY IF:   Your headache becomes really bad.  You have a fever.  You have a stiff neck.  You have trouble seeing.  Your muscles are weak, or you lose muscle control.  You lose your balance or have trouble walking.  You feel like you will pass out (faint), or you pass out.  You have really bad symptoms that are different than your first symptoms.  You have problems with the medicines given to you by your doctor.  Your medicines do not work.  Your headache feels different than the other headaches.  You feel sick to your stomach (nauseous) or throw up (vomit). MAKE SURE YOU:   Understand these instructions.  Will watch your condition.  Will get help right away if you are not doing well or get worse. Document Released: 05/20/2009  Document Revised: 05/18/2011 Document Reviewed: 02/13/2011 [facility] Patient Information 2013 [place], [place]. Headaches, Frequently Asked Questions MIGRAINE HEADACHES Q: What is migraine? What causes it? How can I treat it? A: Generally, migraine headaches begin as a dull ache. Then they develop into a constant, throbbing, and pulsating pain. You may experience pain at the temples. You may experience pain at the front or back of one or both sides of the head. The pain is usually accompanied by a combination of:  Nausea.  Vomiting.  Sensitivity to light and noise. Some people (about 15%) experience an aura (see below) before an attack. The cause of migraine is believed to be chemical reactions in the brain. Treatment for migraine may include over-the-counter or prescription medications. It may also include self-help techniques. These include relaxation training and biofeedback.  Q: What is an aura? A: About 15% of people with migraine get an "aura". This is a sign of neurological symptoms that occur before a migraine headache. You may see wavy or jagged lines, dots, or flashing lights. You might experience tunnel vision or blind spots in one or both eyes. The aura can include visual or auditory hallucinations (something imagined). It may include disruptions in smell (such as strange odors), taste or touch. Other symptoms include:  Numbness.  A "pins and needles" sensation.  Difficulty in recalling or speaking the correct word. These neurological events may last  as long as 60 minutes. These symptoms will fade as the headache begins. Q: What is a trigger? A: Certain physical or environmental factors can lead to or "trigger" a migraine. These include:  Foods.  Hormonal changes.  Weather.  Stress. It is important to remember that triggers are different for everyone. To help prevent migraine attacks, you need to figure out which triggers affect you. Keep a headache diary. This is a good way  to track triggers. The diary will help you talk to your healthcare professional about your condition. Q: Does weather affect migraines? A: Bright sunshine, hot, humid conditions, and drastic changes in barometric pressure may lead to, or "trigger," a migraine attack in some people. But studies have shown that weather does not act as a trigger for everyone with migraines. Q: What is the link between migraine and hormones? A: Hormones start and regulate many of your body's functions. Hormones keep your body in balance within a constantly changing environment. The levels of hormones in your body are unbalanced at times. Examples are during menstruation, pregnancy, or menopause. That can lead to a migraine attack. In fact, about three quarters of all women with migraine report that their attacks are related to the menstrual cycle.  Q: Is there an increased risk of stroke for migraine sufferers? A: The likelihood of a migraine attack causing a stroke is very remote. That is not to say that migraine sufferers cannot have a stroke associated with their migraines. In persons under age 3, the most common associated factor for stroke is migraine headache. But over the course of a person's normal life span, the occurrence of migraine headache may actually be associated with a reduced risk of dying from cerebrovascular disease due to stroke.  Q: What are acute medications for migraine? A: Acute medications are used to treat the pain of the headache after it has started. Examples over-the-counter medications, NSAIDs, ergots, and triptans.  Q: What are the triptans? A: Triptans are the newest class of abortive medications. They are specifically targeted to treat migraine. Triptans are vasoconstrictors. They moderate some chemical reactions in the brain. The triptans work on receptors in your brain. Triptans help to restore the balance of a neurotransmitter called serotonin. Fluctuations in levels of serotonin are  thought to be a main cause of migraine.  Q: Are over-the-counter medications for migraine effective? A: Over-the-counter, or "OTC," medications may be effective in relieving mild to moderate pain and associated symptoms of migraine. But you should see your caregiver before beginning any treatment regimen for migraine.  Q: What are preventive medications for migraine? A: Preventive medications for migraine are sometimes referred to as "prophylactic" treatments. They are used to reduce the frequency, severity, and length of migraine attacks. Examples of preventive medications include antiepileptic medications, antidepressants, beta-blockers, calcium channel blockers, and NSAIDs (nonsteroidal anti-inflammatory drugs). Q: Why are anticonvulsants used to treat migraine? A: During the past few years, there has been an increased interest in antiepileptic drugs for the prevention of migraine. They are sometimes referred to as "anticonvulsants". Both epilepsy and migraine may be caused by similar reactions in the brain.  Q: Why are antidepressants used to treat migraine? A: Antidepressants are typically used to treat people with depression. They may reduce migraine frequency by regulating chemical levels, such as serotonin, in the brain.  Q: What alternative therapies are used to treat migraine? A: The term "alternative therapies" is often used to describe treatments considered outside the scope of conventional Western medicine. Examples of  alternative therapy include acupuncture, acupressure, and yoga. Another common alternative treatment is herbal therapy. Some herbs are believed to relieve headache pain. Always discuss alternative therapies with your caregiver before proceeding. Some herbal products contain arsenic and other toxins. TENSION HEADACHES Q: What is a tension-type headache? What causes it? How can I treat it? A: Tension-type headaches occur randomly. They are often the result of temporary stress,  anxiety, fatigue, or anger. Symptoms include soreness in your temples, a tightening band-like sensation around your head (a "vice-like" ache). Symptoms can also include a pulling feeling, pressure sensations, and contracting head and neck muscles. The headache begins in your forehead, temples, or the back of your head and neck. Treatment for tension-type headache may include over-the-counter or prescription medications. Treatment may also include self-help techniques such as relaxation training and biofeedback. CLUSTER HEADACHES Q: What is a cluster headache? What causes it? How can I treat it? A: Cluster headache gets its name because the attacks come in groups. The pain arrives with little, if any, warning. It is usually on one side of the head. A tearing or bloodshot eye and a runny nose on the same side of the headache may also accompany the pain. Cluster headaches are believed to be caused by chemical reactions in the brain. They have been described as the most severe and intense of any headache type. Treatment for cluster headache includes prescription medication and oxygen. SINUS HEADACHES Q: What is a sinus headache? What causes it? How can I treat it? A: When a cavity in the bones of the face and skull (a sinus) becomes inflamed, the inflammation will cause localized pain. This condition is usually the result of an allergic reaction, a tumor, or an infection. If your headache is caused by a sinus blockage, such as an infection, you will probably have a fever. An x-ray will confirm a sinus blockage. Your caregiver's treatment might include antibiotics for the infection, as well as antihistamines or decongestants.  REBOUND HEADACHES Q: What is a rebound headache? What causes it? How can I treat it? A: A pattern of taking acute headache medications too often can lead to a condition known as "rebound headache." A pattern of taking too much headache medication includes taking it more than 2 days per  week or in excessive amounts. That means more than the label or a caregiver advises. With rebound headaches, your medications not only stop relieving pain, they actually begin to cause headaches. Doctors treat rebound headache by tapering the medication that is being overused. Sometimes your caregiver will gradually substitute a different type of treatment or medication. Stopping may be a challenge. Regularly overusing a medication increases the potential for serious side effects. Consult a caregiver if you regularly use headache medications more than 2 days per week or more than the label advises. ADDITIONAL QUESTIONS AND ANSWERS Q: What is biofeedback? A: Biofeedback is a self-help treatment. Biofeedback uses special equipment to monitor your body's involuntary physical responses. Biofeedback monitors:  Breathing.  Pulse.  Heart rate.  Temperature.  Muscle tension.  Brain activity. Biofeedback helps you refine and perfect your relaxation exercises. You learn to control the physical responses that are related to stress. Once the technique has been mastered, you do not need the equipment any more. Q: Are headaches hereditary? A: Four out of five (80%) of people that suffer report a family history of migraine. Scientists are not sure if this is genetic or a family predisposition. Despite the uncertainty, a child has a 50%  chance of having migraine if one parent suffers. The child has a 75% chance if both parents suffer.  Q: Can children get headaches? A: By the time they reach high school, most young people have experienced some type of headache. Many safe and effective approaches or medications can prevent a headache from occurring or stop it after it has begun.  Q: What type of doctor should I see to diagnose and treat my headache? A: Start with your primary caregiver. Discuss his or her experience and approach to headaches. Discuss methods of classification, diagnosis, and treatment. Your  caregiver may decide to recommend you to a headache specialist, depending upon your symptoms or other physical conditions. Having diabetes, allergies, etc., may require a more comprehensive and inclusive approach to your headache. The National Head[employer] Foundation will provide, upon request, a list of [facility] physician members in your state. Document Released: 05/16/2003 Document Revised: 05/18/2011 Document Reviewed: 10/24/2007 [facility] Patient Information 2013 [place], [place].

## 2012-01-18 NOTE — Assessment & Plan Note (Signed)
Pt given food today Referred to social work to discuss options with patient for finances.  She is having trouble paying medical bills, she had the yellow card and recently met with Rudell Cobb to get paperwork to reapply

## 2012-01-19 ENCOUNTER — Encounter: Payer: Self-pay | Admitting: Internal Medicine

## 2012-01-19 LAB — URINALYSIS, ROUTINE W REFLEX MICROSCOPIC
Bilirubin Urine: NEGATIVE
Hgb urine dipstick: NEGATIVE
Ketones, ur: NEGATIVE mg/dL
Leukocytes, UA: NEGATIVE
Nitrite: NEGATIVE
Protein, ur: NEGATIVE mg/dL
Specific Gravity, Urine: 1.014 (ref 1.005–1.030)
Urobilinogen, UA: 0.2 mg/dL (ref 0.0–1.0)
pH: 5 (ref 5.0–8.0)

## 2012-01-19 LAB — BASIC METABOLIC PANEL WITH GFR
BUN: 12 mg/dL (ref 6–23)
CO2: 26 mEq/L (ref 19–32)
Calcium: 9.6 mg/dL (ref 8.4–10.5)
Creat: 0.9 mg/dL (ref 0.50–1.10)
GFR, Est African American: 79 mL/min
GFR, Est Non African American: 68 mL/min
Potassium: 4.6 mEq/L (ref 3.5–5.3)
Sodium: 137 mEq/L (ref 135–145)

## 2012-01-19 LAB — T4, FREE: Free T4: 1.19 ng/dL (ref 0.80–1.80)

## 2012-01-19 LAB — TSH: TSH: 0.59 u[IU]/mL (ref 0.350–4.500)

## 2012-01-21 ENCOUNTER — Encounter: Payer: Self-pay | Admitting: Internal Medicine

## 2012-01-22 ENCOUNTER — Ambulatory Visit (INDEPENDENT_AMBULATORY_CARE_PROVIDER_SITE_OTHER): Payer: Medicare Other | Admitting: *Deleted

## 2012-01-22 ENCOUNTER — Encounter (INDEPENDENT_AMBULATORY_CARE_PROVIDER_SITE_OTHER): Payer: Medicare Other

## 2012-01-22 DIAGNOSIS — G459 Transient cerebral ischemic attack, unspecified: Secondary | ICD-10-CM | POA: Diagnosis not present

## 2012-01-22 DIAGNOSIS — I4891 Unspecified atrial fibrillation: Secondary | ICD-10-CM

## 2012-01-22 DIAGNOSIS — Z7901 Long term (current) use of anticoagulants: Secondary | ICD-10-CM | POA: Diagnosis not present

## 2012-01-22 DIAGNOSIS — I6529 Occlusion and stenosis of unspecified carotid artery: Secondary | ICD-10-CM | POA: Diagnosis not present

## 2012-01-25 ENCOUNTER — Telehealth: Payer: Self-pay | Admitting: Licensed Clinical Social Worker

## 2012-01-25 NOTE — Telephone Encounter (Signed)
[name] was referred to CSW for financial resources.  CSW placed call to [name].  Pt is on fixed income and lives in [place].  Pt has adult children and states she is over income for Medicaid.  Pt does have assests (pt has car payment).  [name] states she thought her Part D was free and know realizes she will have to pay more for her medications than what was anticipated.  CSW discussed [employer] and will send pt information on [employer] and [employer] program.  Pt complains of [place] financial not wanting to accept $30 payment towards her  bill, pt states they only want $54/month.  CSW encouraged [name] to send as much as pt could towards financial debt.  Pt does not think she has applied for [employer] assistance, pt states she had to bring in too many items to [facility] financial counselor.  CSW explained information helps assess financial need and pt can now schedule an appt.  CSW will send information on applying and scheduling with [facility].  Pt denies add'l needs at this time.

## 2012-01-25 NOTE — Progress Notes (Signed)
agree

## 2012-01-26 ENCOUNTER — Encounter: Payer: Self-pay | Admitting: Licensed Clinical Social Worker

## 2012-02-25 ENCOUNTER — Ambulatory Visit (INDEPENDENT_AMBULATORY_CARE_PROVIDER_SITE_OTHER): Payer: Medicare Other | Admitting: Internal Medicine

## 2012-02-25 ENCOUNTER — Encounter: Payer: Self-pay | Admitting: Internal Medicine

## 2012-02-25 VITALS — BP 117/58 | HR 92 | Temp 97.2°F | Ht 63.0 in | Wt 193.3 lb

## 2012-02-25 DIAGNOSIS — M549 Dorsalgia, unspecified: Secondary | ICD-10-CM | POA: Diagnosis not present

## 2012-02-25 DIAGNOSIS — G8929 Other chronic pain: Secondary | ICD-10-CM

## 2012-02-25 DIAGNOSIS — IMO0001 Reserved for inherently not codable concepts without codable children: Secondary | ICD-10-CM

## 2012-02-25 DIAGNOSIS — I4891 Unspecified atrial fibrillation: Secondary | ICD-10-CM

## 2012-02-25 DIAGNOSIS — I10 Essential (primary) hypertension: Secondary | ICD-10-CM

## 2012-02-25 DIAGNOSIS — Z5987 Material hardship due to limited financial resources, not elsewhere classified: Secondary | ICD-10-CM

## 2012-02-25 DIAGNOSIS — Z598 Other problems related to housing and economic circumstances: Secondary | ICD-10-CM

## 2012-02-25 DIAGNOSIS — F329 Major depressive disorder, single episode, unspecified: Secondary | ICD-10-CM | POA: Diagnosis not present

## 2012-02-25 DIAGNOSIS — R109 Unspecified abdominal pain: Secondary | ICD-10-CM | POA: Diagnosis not present

## 2012-02-25 DIAGNOSIS — R51 Headache: Secondary | ICD-10-CM

## 2012-02-25 DIAGNOSIS — Z599 Problem related to housing and economic circumstances, unspecified: Secondary | ICD-10-CM

## 2012-02-25 DIAGNOSIS — F3289 Other specified depressive episodes: Secondary | ICD-10-CM | POA: Diagnosis not present

## 2012-02-25 DIAGNOSIS — R519 Headache, unspecified: Secondary | ICD-10-CM | POA: Insufficient documentation

## 2012-02-25 DIAGNOSIS — E119 Type 2 diabetes mellitus without complications: Secondary | ICD-10-CM | POA: Diagnosis not present

## 2012-02-25 LAB — GLUCOSE, CAPILLARY: Glucose-Capillary: 100 mg/dL — ABNORMAL HIGH (ref 70–99)

## 2012-02-25 MED ORDER — AMITRIPTYLINE HCL 100 MG PO TABS: 100.0000 mg | ORAL_TABLET | Freq: Every day | ORAL | Status: DC

## 2012-02-25 NOTE — Patient Instructions (Addendum)
General Instructions: Please follow up with [facility] when you can [phone].  You can walk in Monday-Friday 11-3PM.  If you are having bad thoughts call 911 For now we will increase your Amitripyline dose to 100 mg daily. We will we see you back in 1 month     Treatment Goals:  Goals (1 Years of Data) as of 02/25/2012    Hypertension and diabetes at goal.       Progress Toward Treatment Goals:  Treatment Goal 02/25/2012  Hemoglobin A1C at goal  Blood pressure at goal    Self Care Goals & Plans:  Self Care Goal 02/25/2012  Manage my medications take my medicines as prescribed; bring my medications to every visit  Monitor my health keep track of my blood glucose; keep track of my blood pressure  Eat healthy foods drink diet soda or water instead of juice or soda; eat foods that are low in salt  Be physically active find an activity I enjoy    Home Blood Glucose Monitoring 02/25/2012  Check my blood sugar (No Data)     Care Management & Community Referrals:  Referral 02/25/2012  Referrals made for care management support none needed  Referrals made to community resources (No Data)        Helping Someone Who Is Suicidal Take threats of suicide seriously. Listen to a suicidal person's thoughts and concerns with compassion. The fact that the person is talking to you is an important sign that he or she trusts you. Reasons for suicide can depend on where we are in life.   The younger person is often depressed over lost love.  The middle-aged person is often depressed over financial problems.  The elderly person is often depressed over health problems. SIGNS IN FAMILY OR FRIENDS WHO ARE SUICIDAL INCLUDE:  Depression which suddenly gets better. Getting over depression is usually a gradual process. A sudden change may mean the person has suddenly thought of suicide as a "solution."  A sudden loss of interest in family and friends and social  withdrawal.  Loss of personal hygiene habits and not caring for himself or herself.  Decline in handling of school, work, or other activities.  Injuries which are self-inflicted, such as burning or cutting.  Expressions of helplessness, hopelessness, and a sense of the loss of ability to handle life.  Risk-taking behavior, such as casual sex and drug use. COMMON SUICIDE RISKS INCLUDE:  Death or terminal illness of a relative or friend.  Broken relationships.  Loss of health.  Financial losses.  Chemical abuse (drugs and alcohol).  Previous suicide attempts. If you do not feel adequate to listen or help, ask the person if you can help him or her get help. Ask if you can share the person's concerns with someone else such as a Pharmacist, hospital. Just talking with someone else is helpful and you can be that help by listening. Some helpful tips are:  Listen to the person's thoughts and concerns. Let the person unburden his or her troubles on you.  Let the person know you will not let him or her be alone with the pain.  Ask the person if he or she is having thoughts of hurting himself or herself.  Ask what you can do to help lessen the pain.  Suggest that the person seek professional help and that you will assist him or her in finding help. Let the person know you will continue to be available to help. GET  HELP  Contact a suicide hotline, crisis center, or local suicide prevention center for help right away. Local centers may include a hospital, clinic, community service organization, social service provider, or health department.  Call your local emergency services (911 in the [place]).  Call a suicide hotline:  1-800-273-TALK (530-209-0762) in the [place].  1-800-SUICIDE (309) 037-6693) in the [place].  (949)352-5982 in the [place] for Spanish-speaking counselors.  4-696-295-2WUX 507-369-8164) in the [place] for TTY users.  Visit  the following websites for information and help:  National Suicide Prevention Lifeline: www.suicidepreventionlifeline.org  Hopeline: www.hopeline.com  [employer] for Suicide Prevention: https://www.ayers.com/  For lesbian, gay, bisexual, transgender, or questioning youth, contact [employer]:  3-664-4-I-HKVQQV 214-397-5778) in the [place].  www.thetrevorproject.org  In [place], treatment resources are listed in each province with listings available under [employer] for [employer] or similar titles. Another source for Crisis Centres by [place] is located at http://www.suicideprevention.ca/in-crisis-now/find-a-crisis-centre-now/crisis-centres Document Released: 08/30/2002 Document Revised: 05/18/2011 Document Reviewed: 11/01/2007 [facility] Patient Information 2013 [place], [place].  Diverticulosis Diverticulosis is a common condition that develops when small pouches (diverticula) form in the wall of the colon. The risk of diverticulosis increases with age. It happens more often in people who eat a low-fiber diet. Most individuals with diverticulosis have no symptoms. Those individuals with symptoms usually experience abdominal pain, constipation, or loose stools (diarrhea). HOME CARE INSTRUCTIONS   Increase the amount of fiber in your diet as directed by your caregiver or dietician. This may reduce symptoms of diverticulosis.  Your caregiver may recommend taking a dietary fiber supplement.  Drink at least 6 to 8 glasses of water each day to prevent constipation.  Try not to strain when you have a bowel movement.  Your caregiver may recommend avoiding nuts and seeds to prevent complications, although this is still an uncertain benefit.  Only take over-the-counter or prescription medicines for pain, discomfort, or fever as directed by your caregiver. FOODS WITH HIGH FIBER CONTENT INCLUDE:  Fruits. Apple, peach, pear, tangerine, raisins, prunes.  Vegetables.  Brussels sprouts, asparagus, broccoli, cabbage, carrot, cauliflower, romaine lettuce, spinach, summer squash, tomato, winter squash, zucchini.  Starchy Vegetables. Baked beans, kidney beans, lima beans, split peas, lentils, potatoes (with skin).  Grains. Whole wheat bread, brown rice, bran flake cereal, plain oatmeal, white rice, shredded wheat, bran muffins. SEEK IMMEDIATE MEDICAL CARE IF:   You develop increasing pain or severe bloating.  You have an oral temperature above 102 F (38.9 C), not controlled by medicine.  You develop vomiting or bowel movements that are bloody or black. Document Released: 11/21/2003 Document Revised: 05/18/2011 Document Reviewed: 07/24/2009 [facility] Patient Information 2013 [place], [place].  Depression  Depression is a strong emotion of feeling unhappy that can last for weeks, months, or even longer. Depression causes problems with the ability to function in life. It upsets your:   Relationships.  Sleep.  Eating habits.  Work habits. HOME CARE  Take all medicine as told by your doctor.  Talk with a therapist, counselor, or friend.  Eat a healthy diet.  Exercise regularly.  Do not drink alcohol or use drugs. GET HELP RIGHT AWAY IF: You start to have thoughts about hurting yourself or others. MAKE SURE YOU:  Understand these instructions.  Will watch your condition.  Will get help right away if you are not doing well or get worse. Document Released: 03/28/2010 Document Revised: 05/18/2011 Document Reviewed: 03/28/2010 [facility] Patient Information 2013 [place], [place].  Tension Headache A tension headache is a  feeling of pain, pressure, or aching often felt over the front and sides of the head. The pain can be dull or can feel tight (constricting). It is the most common type of headache. Tension headaches are not normally associated with nausea or vomiting and do not get worse with physical activity. Tension headaches can last 30 minutes  to several days.  CAUSES  The exact cause is not known, but it may be caused by chemicals and hormones in the brain that lead to pain. Tension headaches often begin after stress, anxiety, or depression. Other triggers may include:  Alcohol.  Caffeine (too much or withdrawal).  Respiratory infections (colds, flu, sinus infections).  Dental problems or teeth clenching.  Fatigue.  Holding your head and neck in one position too long while using a computer. SYMPTOMS   Pressure around the head.   Dull, aching head pain.   Pain felt over the front and sides of the head.   Tenderness in the muscles of the head, neck, and shoulders. DIAGNOSIS  A tension headache is often diagnosed based on:   Symptoms.   Physical examination.   A CT scan or MRI of your head. These tests may be ordered if symptoms are severe or unusual. TREATMENT  Medicines may be given to help relieve symptoms.  HOME CARE INSTRUCTIONS   Only take over-the-counter or prescription medicines for pain or discomfort as directed by your caregiver.   Lie down in a dark, quiet room when you have a headache.   Keep a journal to find out what may be triggering your headaches. For example, write down:  What you eat and drink.  How much sleep you get.  Any change to your diet or medicines.  Try massage or other relaxation techniques.   Ice packs or heat applied to the head and neck can be used. Use these 3 to 4 times per day for 15 to 20 minutes each time, or as needed.   Limit stress.   Sit up straight, and do not tense your muscles.   Quit smoking if you smoke.  Limit alcohol use.  Decrease the amount of caffeine you drink, or stop drinking caffeine.  Eat and exercise regularly.  Get 7 to 9 hours of sleep, or as recommended by your caregiver.  Avoid excessive use of pain medicine as recurrent headaches can occur.  SEEK MEDICAL CARE IF:   You have problems with the medicines you were  prescribed.  Your medicines do not work.  You have a change from the usual headache.  You have nausea or vomiting. SEEK IMMEDIATE MEDICAL CARE IF:   Your headache becomes severe.  You have a fever.  You have a stiff neck.  You have loss of vision.  You have muscular weakness or loss of muscle control.  You lose your balance or have trouble walking.  You feel faint or pass out.  You have severe symptoms that are different from your first symptoms. MAKE SURE YOU:   Understand these instructions.  Will watch your condition.  Will get help right away if you are not doing well or get worse. Document Released: 02/23/2005 Document Revised: 05/18/2011 Document Reviewed: 02/13/2011 [facility] Patient Information 2013 [place], [place].  Headache Headaches are caused by many different problems. Most commonly, headache is caused by muscle tension from an injury, fatigue, or emotional upset. Excessive muscle contractions in the scalp and neck result in a headache that often feels like a tight band around the head. Tension headaches  often have areas of tenderness over the scalp and the back of the neck. These headaches may last for hours, days, or longer, and some may contribute to migraines in those who have migraine problems. Migraines usually cause a throbbing headache, which is made worse by activity. Sometimes only one side of the head hurts. Nausea, vomiting, eye pain, and avoidance of food are common with migraines. Visual symptoms such as light sensitivity, blind spots, or flashing lights may also occur. Loud noises may worsen migraine headaches. Many factors may cause migraine headaches:  Emotional stress, lack of sleep, and menstrual periods.   Alcohol and some drugs (such as birth control pills).   Diet factors (fasting, caffeine, food preservatives, chocolate).   Environmental factors (weather changes, bright lights, odors, smoke).  Other causes of headaches include minor  injuries to the head. Arthritis in the neck; problems with the jaw, eyes, ears, or nose are also causes of headaches. Allergies, drugs, alcohol, and exposure to smoke can also cause moderate headaches. Rebound headaches can occur if someone uses pain medications for a long period of time and then stops. Less commonly, blood vessel problems in the neck and brain (including stroke) can cause various types of headache. Treatment of headaches includes medicines for pain and relaxation. Ice packs or heat applied to the back of the head and neck help some people. Massaging the shoulders, neck and scalp are often very useful. Relaxation techniques and stretching can help prevent these headaches. Avoid alcohol and cigarette smoking as these tend to make headaches worse. Please see your caregiver if your headache is not better in 2 days.  SEEK IMMEDIATE MEDICAL CARE IF:   You develop a high fever, chills, or repeated vomiting.   You faint or have difficulty with vision.   You develop unusual numbness or weakness of your arms or legs.   Relief of pain is inadequate with medication, or you develop severe pain.   You develop confusion, or neck stiffness.   You have a worsening of a headache or do not obtain relief.  Document Released: 02/23/2005 Document Revised: 02/12/2011 Document Reviewed: 08/19/2006 [facility] Patient Information 2012 [place], [place].   Abdominal Pain Abdominal pain can be caused by many things. Your caregiver decides the seriousness of your pain by an examination and possibly blood tests and X-rays. Many cases can be observed and treated at home. Most abdominal pain is not caused by a disease and will probably improve without treatment. However, in many cases, more time must pass before a clear cause of the pain can be found. Before that point, it may not be known if you need more testing, or if hospitalization or surgery is needed. HOME CARE INSTRUCTIONS   Do not take laxatives unless  directed by your caregiver.  Take pain medicine only as directed by your caregiver.  Only take over-the-counter or prescription medicines for pain, discomfort, or fever as directed by your caregiver.  Try a clear liquid diet (broth, tea, or water) for as long as directed by your caregiver. Slowly move to a bland diet as tolerated. SEEK IMMEDIATE MEDICAL CARE IF:   The pain does not go away.  You have a fever.  You keep throwing up (vomiting).  The pain is felt only in portions of the abdomen. Pain in the right side could possibly be appendicitis. In an adult, pain in the left lower portion of the abdomen could be colitis or diverticulitis.  You pass bloody or black tarry stools. MAKE SURE YOU:  Understand these instructions.  Will watch your condition.  Will get help right away if you are not doing well or get worse. Document Released: 12/03/2004 Document Revised: 05/18/2011 Document Reviewed: 10/12/2007 [facility] Patient Information 2013 [place], [place].

## 2012-02-28 ENCOUNTER — Encounter: Payer: Self-pay | Admitting: Internal Medicine

## 2012-02-28 NOTE — Assessment & Plan Note (Signed)
Vicodin and Flexeril help

## 2012-02-28 NOTE — Progress Notes (Signed)
Subjective:    Patient ID: [name], female    DOB: 1950/04/19, 61 y.o.   MRN: [id number]  HPI Comments: 61 y.o significant PMH (Atrial fibrillation (INR 2.3 01/22/12), DM 2 cbg 100, depression, HTN (BP 117/58), TIA, GERD, osteoarthritis, fibromyalgia, chronic pain, ventral hernia s/p complex repair, diverticulosis throughout colon, hemorrhoids, chronic h/a)  She presents for follow up with complaints of depression.  Family dynamics with her daughter in [place] is not very well.  Her daughter does not communicate with her or visit much and she is sad about not spending the holidays with her daughter and her family as her daughter has a 40 year old child.  She is also depressed about financial stressors.  She reports variable sleep, variable interest (she finds herself a lot of times wanting to be alone), denies guilty, decreased energy, decreased concentration, weight gain, denies active SI but within the last month she has thought about suicide with an undisclosed plan.  She has been to [facility] in the past [phone] but has not followed in 1 year due to affordability.  I called [facility] it will be $35 for the patient to re-establish care and they provide group not individual counseling.    She complains of chronic h/a but noticed h/a daily x 1 month.  Starts left temple and radiates to her forehead.  Today h/a is starting again and can get as high as 8/10.  She denies visual changes, caffeine.  Headaches are not made worse by light or noise.  She denies lightheadedness or dizziness.  She denies loss of vision.  She believes trigger maybe stress due to finances.    She complains of right lower back pain today 10/10 relieved by Flexeril and Vicodin but she has not taken those medications due to needing to drive to her appointment.    She c/o intermittent abdominal pain epigastric and LLQ.  In surgical scar areas to abdomen pain feels like needles.  LLQ pain is aching.  She states her abdominal  pain is aggravated by moving boxes as she recently moved and rest helps soothe the pain.    She noticed blood in her stool slightly 1 month ago but not since then.  She reports variation in her stool from loose to constipation.  She has bowel movements 2-3 times a day and notices certain foods trigger bowel habits i.e. Spagetti      Review of Systems  Constitutional: Positive for unexpected weight change.  Respiratory: Negative for shortness of breath.   Cardiovascular: Negative for chest pain.  Gastrointestinal: Positive for abdominal pain. Negative for blood in stool.  Genitourinary: Negative for dysuria.       Denies being sexually active   Musculoskeletal: Positive for back pain.  Neurological: Positive for headaches. Negative for dizziness and light-headedness.  Psychiatric/Behavioral: Positive for suicidal ideas, sleep disturbance, dysphoric mood and decreased concentration.       Objective:   Physical Exam  Nursing note and vitals reviewed. Constitutional: She is oriented to person, place, and time. Vital signs are normal. She appears well-developed and well-nourished. She is cooperative. No distress.       Pleasant, talkative  HENT:  Head: Normocephalic and atraumatic.  Mouth/Throat: Oropharynx is clear and moist and mucous membranes are normal. No oropharyngeal exudate.  Eyes: Conjunctivae normal are normal. Pupils are equal, round, and reactive to light. Right eye exhibits no discharge. Left eye exhibits no discharge. No scleral icterus.  Cardiovascular: Normal rate.  An irregular rhythm  present.  No murmur heard. Pulmonary/Chest: Effort normal and breath sounds normal. No respiratory distress. She has no wheezes.  Abdominal: Soft. Bowel sounds are normal. There is tenderness in the left lower quadrant. There is no rebound and no guarding.    Musculoskeletal: She exhibits tenderness.       Arms: Neurological: She is alert and oriented to person, place, and time. Gait  normal.  Skin: Skin is warm, dry and intact. No rash noted. She is not diaphoretic.  Psychiatric: Her speech is normal and behavior is normal. Judgment normal. Cognition and memory are normal. She exhibits a depressed mood. She expresses no suicidal ideation. She expresses no suicidal plans.       No active SI or plan 12/19 visit          Assessment & Plan:  1 month f/u depression after change in medications. Increase Prozac to 40 mg if not improved

## 2012-02-28 NOTE — Assessment & Plan Note (Signed)
There is possibly nerve damage status post surgery of ventral hernia causing parathesias.  She also has diverticulosis which may be a cause of abdominal pain.  This was noted on colonoscopy 10/2008 with diffuse diverticulosis.  No evidence of polyps or cancer

## 2012-02-28 NOTE — Assessment & Plan Note (Addendum)
Uncontrolled Will increase Elavil to 100 qhs  Consider increasing Prozac to 40 at next visit  [name] [phone]. Patient can walk in M-F from 11am-3pm to re-establish care.  She has not been in a year and will need to reestablish.   Advised patient to call 911 if she has active SI F/u 1 month

## 2012-02-28 NOTE — Assessment & Plan Note (Signed)
Irregular rhythm rate controlled  Continue Coumadin INR 2.3 01/2012

## 2012-02-28 NOTE — Assessment & Plan Note (Signed)
She was given food out of the pantry today

## 2012-02-28 NOTE — Assessment & Plan Note (Signed)
Neurologically intact.  Headaches seem stress induced possibly tension Will increase Elavil to 100 qhs.   She can take Tylenol for relief not to exceed 2g daily in combo with Vicodin Vicodin may cause rebound h/a. Try to avoid for use of h/a If headaches are not resolving consider migraines and referral to neurologist.

## 2012-03-04 ENCOUNTER — Ambulatory Visit (INDEPENDENT_AMBULATORY_CARE_PROVIDER_SITE_OTHER): Payer: Medicare Other | Admitting: *Deleted

## 2012-03-04 DIAGNOSIS — I4891 Unspecified atrial fibrillation: Secondary | ICD-10-CM | POA: Diagnosis not present

## 2012-03-04 DIAGNOSIS — G459 Transient cerebral ischemic attack, unspecified: Secondary | ICD-10-CM

## 2012-03-04 DIAGNOSIS — Z7901 Long term (current) use of anticoagulants: Secondary | ICD-10-CM

## 2012-03-04 LAB — POCT INR: INR: 3

## 2012-04-08 ENCOUNTER — Encounter (HOSPITAL_COMMUNITY): Payer: Self-pay | Admitting: Emergency Medicine

## 2012-04-08 ENCOUNTER — Emergency Department (HOSPITAL_COMMUNITY): Payer: Medicare Other

## 2012-04-08 ENCOUNTER — Emergency Department (HOSPITAL_COMMUNITY)
Admission: EM | Admit: 2012-04-08 | Discharge: 2012-04-09 | Disposition: A | Discharge status: Home / Self Care | Payer: Medicare Other | Attending: Emergency Medicine | Admitting: Emergency Medicine

## 2012-04-08 DIAGNOSIS — E785 Hyperlipidemia, unspecified: Secondary | ICD-10-CM | POA: Diagnosis not present

## 2012-04-08 DIAGNOSIS — I4891 Unspecified atrial fibrillation: Secondary | ICD-10-CM | POA: Diagnosis not present

## 2012-04-08 DIAGNOSIS — R059 Cough, unspecified: Secondary | ICD-10-CM | POA: Diagnosis not present

## 2012-04-08 DIAGNOSIS — E669 Obesity, unspecified: Secondary | ICD-10-CM | POA: Insufficient documentation

## 2012-04-08 DIAGNOSIS — R142 Eructation: Secondary | ICD-10-CM | POA: Insufficient documentation

## 2012-04-08 DIAGNOSIS — R1084 Generalized abdominal pain: Secondary | ICD-10-CM | POA: Insufficient documentation

## 2012-04-08 DIAGNOSIS — K219 Gastro-esophageal reflux disease without esophagitis: Secondary | ICD-10-CM | POA: Diagnosis not present

## 2012-04-08 DIAGNOSIS — R110 Nausea: Secondary | ICD-10-CM | POA: Diagnosis not present

## 2012-04-08 DIAGNOSIS — R000 Tachycardia, unspecified: Secondary | ICD-10-CM | POA: Diagnosis not present

## 2012-04-08 DIAGNOSIS — E039 Hypothyroidism, unspecified: Secondary | ICD-10-CM | POA: Diagnosis not present

## 2012-04-08 DIAGNOSIS — M1990 Unspecified osteoarthritis, unspecified site: Secondary | ICD-10-CM | POA: Diagnosis not present

## 2012-04-08 DIAGNOSIS — G909 Disorder of the autonomic nervous system, unspecified: Secondary | ICD-10-CM | POA: Insufficient documentation

## 2012-04-08 DIAGNOSIS — G4733 Obstructive sleep apnea (adult) (pediatric): Secondary | ICD-10-CM | POA: Insufficient documentation

## 2012-04-08 DIAGNOSIS — Z9851 Tubal ligation status: Secondary | ICD-10-CM | POA: Insufficient documentation

## 2012-04-08 DIAGNOSIS — F3289 Other specified depressive episodes: Secondary | ICD-10-CM | POA: Insufficient documentation

## 2012-04-08 DIAGNOSIS — F329 Major depressive disorder, single episode, unspecified: Secondary | ICD-10-CM | POA: Diagnosis not present

## 2012-04-08 DIAGNOSIS — E1149 Type 2 diabetes mellitus with other diabetic neurological complication: Secondary | ICD-10-CM | POA: Diagnosis not present

## 2012-04-08 DIAGNOSIS — Z8669 Personal history of other diseases of the nervous system and sense organs: Secondary | ICD-10-CM | POA: Insufficient documentation

## 2012-04-08 DIAGNOSIS — Z79899 Other long term (current) drug therapy: Secondary | ICD-10-CM | POA: Insufficient documentation

## 2012-04-08 DIAGNOSIS — J3489 Other specified disorders of nose and nasal sinuses: Secondary | ICD-10-CM | POA: Diagnosis not present

## 2012-04-08 DIAGNOSIS — J159 Unspecified bacterial pneumonia: Secondary | ICD-10-CM | POA: Insufficient documentation

## 2012-04-08 DIAGNOSIS — R141 Gas pain: Secondary | ICD-10-CM | POA: Insufficient documentation

## 2012-04-08 DIAGNOSIS — Z9079 Acquired absence of other genital organ(s): Secondary | ICD-10-CM | POA: Insufficient documentation

## 2012-04-08 DIAGNOSIS — Z90710 Acquired absence of both cervix and uterus: Secondary | ICD-10-CM | POA: Insufficient documentation

## 2012-04-08 DIAGNOSIS — Z8679 Personal history of other diseases of the circulatory system: Secondary | ICD-10-CM | POA: Insufficient documentation

## 2012-04-08 DIAGNOSIS — R05 Cough: Secondary | ICD-10-CM | POA: Diagnosis not present

## 2012-04-08 DIAGNOSIS — I509 Heart failure, unspecified: Secondary | ICD-10-CM | POA: Diagnosis not present

## 2012-04-08 DIAGNOSIS — I10 Essential (primary) hypertension: Secondary | ICD-10-CM | POA: Diagnosis not present

## 2012-04-08 DIAGNOSIS — Z8719 Personal history of other diseases of the digestive system: Secondary | ICD-10-CM | POA: Insufficient documentation

## 2012-04-08 DIAGNOSIS — K4020 Bilateral inguinal hernia, without obstruction or gangrene, not specified as recurrent: Secondary | ICD-10-CM | POA: Diagnosis not present

## 2012-04-08 DIAGNOSIS — R5381 Other malaise: Secondary | ICD-10-CM | POA: Diagnosis not present

## 2012-04-08 DIAGNOSIS — R143 Flatulence: Secondary | ICD-10-CM | POA: Insufficient documentation

## 2012-04-08 DIAGNOSIS — R0602 Shortness of breath: Secondary | ICD-10-CM | POA: Diagnosis not present

## 2012-04-08 DIAGNOSIS — Z8673 Personal history of transient ischemic attack (TIA), and cerebral infarction without residual deficits: Secondary | ICD-10-CM | POA: Diagnosis not present

## 2012-04-08 DIAGNOSIS — Z9089 Acquired absence of other organs: Secondary | ICD-10-CM | POA: Insufficient documentation

## 2012-04-08 DIAGNOSIS — Z85828 Personal history of other malignant neoplasm of skin: Secondary | ICD-10-CM | POA: Insufficient documentation

## 2012-04-08 DIAGNOSIS — Z7901 Long term (current) use of anticoagulants: Secondary | ICD-10-CM | POA: Insufficient documentation

## 2012-04-08 DIAGNOSIS — J029 Acute pharyngitis, unspecified: Secondary | ICD-10-CM | POA: Insufficient documentation

## 2012-04-08 DIAGNOSIS — Z8739 Personal history of other diseases of the musculoskeletal system and connective tissue: Secondary | ICD-10-CM | POA: Insufficient documentation

## 2012-04-08 DIAGNOSIS — J189 Pneumonia, unspecified organism: Secondary | ICD-10-CM

## 2012-04-08 DIAGNOSIS — Z9889 Other specified postprocedural states: Secondary | ICD-10-CM | POA: Insufficient documentation

## 2012-04-08 LAB — COMPREHENSIVE METABOLIC PANEL
ALT: 17 U/L (ref 0–35)
AST: 25 U/L (ref 0–37)
Alkaline Phosphatase: 75 U/L (ref 39–117)
BUN: 18 mg/dL (ref 6–23)
CO2: 25 mEq/L (ref 19–32)
Calcium: 9.4 mg/dL (ref 8.4–10.5)
Chloride: 99 mEq/L (ref 96–112)
GFR calc Af Amer: 63 mL/min — ABNORMAL LOW (ref >90)
GFR calc non Af Amer: 55 mL/min — ABNORMAL LOW (ref >90)
Glucose, Bld: 119 mg/dL — ABNORMAL HIGH (ref 70–99)
Sodium: 135 mEq/L (ref 135–145)
Total Bilirubin: 0.8 mg/dL (ref 0.3–1.2)
Total Protein: 7 g/dL (ref 6.0–8.3)

## 2012-04-08 LAB — CBC WITH DIFFERENTIAL/PLATELET
Basophils Absolute: 0 10*3/uL (ref 0.0–0.1)
Basophils Relative: 1 % (ref 0–1)
Eosinophils Absolute: 0.1 10*3/uL (ref 0.0–0.7)
Eosinophils Relative: 1 % (ref 0–5)
HCT: 35.8 % — ABNORMAL LOW (ref 36.0–46.0)
Hemoglobin: 12.4 g/dL (ref 12.0–15.0)
Lymphs Abs: 2 10*3/uL (ref 0.7–4.0)
MCH: 32.3 pg (ref 26.0–34.0)
MCHC: 34.6 g/dL (ref 30.0–36.0)
MCV: 93.2 fL (ref 78.0–100.0)
Monocytes Absolute: 0.7 10*3/uL (ref 0.1–1.0)
Monocytes Relative: 11 % (ref 3–12)
Neutro Abs: 3.7 10*3/uL (ref 1.7–7.7)
Neutrophils Relative %: 57 % (ref 43–77)
Platelets: 194 10*3/uL (ref 150–400)
RBC: 3.84 MIL/uL — ABNORMAL LOW (ref 3.87–5.11)
WBC: 6.5 10*3/uL (ref 4.0–10.5)

## 2012-04-08 LAB — PROTIME-INR
INR: 1.34 (ref 0.00–1.49)
Prothrombin Time: 16.3 seconds — ABNORMAL HIGH (ref 11.6–15.2)

## 2012-04-08 LAB — URINALYSIS, ROUTINE W REFLEX MICROSCOPIC
Bilirubin Urine: NEGATIVE
Glucose, UA: NEGATIVE mg/dL
Hgb urine dipstick: NEGATIVE
Ketones, ur: NEGATIVE mg/dL
Nitrite: POSITIVE — AB
Protein, ur: NEGATIVE mg/dL
Specific Gravity, Urine: 1.012 (ref 1.005–1.030)
Urobilinogen, UA: 0.2 mg/dL (ref 0.0–1.0)

## 2012-04-08 LAB — URINE MICROSCOPIC-ADD ON

## 2012-04-08 MED ORDER — BENZONATATE 100 MG PO CAPS
100.0000 mg | ORAL_CAPSULE | Freq: Once | ORAL | Status: AC
  Administered 2012-04-08: 100 mg via ORAL
  Filled 2012-04-08: qty 1

## 2012-04-08 MED ORDER — SODIUM CHLORIDE 0.9 % IV BOLUS (SEPSIS)
1000.0000 mL | Freq: Once | INTRAVENOUS | Status: AC
  Administered 2012-04-08: 1000 mL via INTRAVENOUS

## 2012-04-08 MED ORDER — ONDANSETRON HCL 4 MG/2ML IJ SOLN
4.0000 mg | Freq: Once | INTRAMUSCULAR | Status: AC
  Administered 2012-04-08: 4 mg via INTRAVENOUS
  Filled 2012-04-08: qty 2

## 2012-04-08 MED ORDER — IOHEXOL 300 MG/ML  SOLN
50.0000 mL | Freq: Once | INTRAMUSCULAR | Status: AC | PRN
  Administered 2012-04-08: 50 mL via ORAL

## 2012-04-08 MED ORDER — IOHEXOL 300 MG/ML  SOLN: 100.0000 mL | Freq: Once | INTRAMUSCULAR | Status: AC | PRN

## 2012-04-08 NOTE — ED Provider Notes (Signed)
History     CSN: [id number]  Arrival date & time 04/08/12  2001   First MD Initiated Contact with Patient 04/08/12 2022      Chief Complaint  Patient presents with  . Cough  . Atrial Fibrillation    (Consider location/radiation/quality/duration/timing/severity/associated sxs/prior treatment) Patient is a 62 y.o. female presenting with cough and abdominal pain. The history is provided by the patient.  Cough This is a new problem. The current episode started more than 2 days ago. The problem occurs constantly. The problem has not changed since onset.The cough is non-productive. The maximum temperature recorded prior to her arrival was 100 to 100.9 F. Associated symptoms include rhinorrhea and sore throat. Pertinent negatives include no chest pain, no headaches, no shortness of breath and no wheezing. She is not a smoker.  Abdominal Pain The primary symptoms of the illness include abdominal pain and nausea. The primary symptoms of the illness do not include fever, fatigue, shortness of breath, vomiting or diarrhea. The current episode started 2 days ago. The onset of the illness was gradual. The problem has been gradually worsening.  The abdominal pain began 2 days ago. The pain came on gradually. The abdominal pain has been gradually worsening since its onset. The abdominal pain is generalized. The abdominal pain does not radiate. The abdominal pain is relieved by nothing. The abdominal pain is exacerbated by certain positions and movement.  Risk factors for an acute abdominal problem include a history of abdominal surgery. Symptoms associated with the illness do not include urgency, frequency or back pain.    Past Medical History  Diagnosis Date  . Depression   . Obesity   . Skin neoplasm   . Bunion   . Carotid stenosis   . Fibromyalgia   . Internal hemorrhoid   . GERD (gastroesophageal reflux disease)   . Chronic gastritis   . Transaminase or LDH elevation   . Mild cognitive  impairment   . Hyperlipidemia   . Fatty liver   . Lumbar back pain   . Diabetic peripheral neuropathy   . Diverticulosis   . Dysphagia     no documented strictures but responded positively to dilation in past.   . Hypertension   . Cholecystitis     s/p cholecystectomy  . Sarcoidosis   . Fibromyalgia   . CHF (congestive heart failure)   . Skin cancer 1990's    "front of my right leg"  . Atrial fibrillation   . OSA (obstructive sleep apnea) 2003    "can't sleep w/that darm machine"  . Exertional dyspnea   . Hypothyroidism   . Type II diabetes mellitus   . TIA (transient ischemic attack)     07/29/11 pt denies this history  . H/O hiatal hernia   . Epileptic seizure, tonic     .No meds since age of 10.  . Osteoarthritis   . Stroke     tia  . Hypothyroid   . Diverticulosis   . Digoxin toxicity     08/2011  . Bradycardia, drug induced     08/2011 (dig)  . Drug-induced hypotension     08/2011 (Dig)  . Fibromyalgia   . Hemorrhoids     Past Surgical History  Procedure Date  . Hiatal hernia repair 1990    "had to have scar tissue removed 6 months after repair"  . Esophageal dilation   . Cataract extraction w/phaco 10/27/2010    Procedure: CATARACT EXTRACTION PHACO AND INTRAOCULAR LENS PLACEMENT (IOC);  Surgeon: Susa Simmonds;  Location: [facility];  Service: Ophthalmology;  Laterality: Left;  CDE- 1.78  . Cataract extraction w/phaco 07/13/2011    Procedure: CATARACT EXTRACTION PHACO AND INTRAOCULAR LENS PLACEMENT (IOC);  Surgeon: Susa Simmonds, MD;  Location: [facility];  Service: Ophthalmology;  Laterality: Right;  CDE:  1.65  . Liposuction 1992  . Inverted nipples 1992  . Breast surgery ~ 2010    excision milk duct; right breast  . Foot surgery ~ 2011    ~straightened toe left foot & scraped bone below big toe"  . Foot surgery ~ 2011    "scraped bone of big toe; shortened middle toe; right foot"  . Cholecystectomy 1980's  . Dilation and curettage of uterus 1970; 1976  .  Abdominal hysterectomy 1980's    partial  . Tubal ligation 1976  . Bilateral oophorectomy 1980's    "after partial hysterectomy"  . Skin cancer excision 1990's    "front side of right shin"  . Ventral hernia repair 07/29/2011    Procedure: LAPAROSCOPIC VENTRAL HERNIA;  Surgeon: Ernestene Mention, MD;  Location: [facility] OR;  Service: General;  Laterality: N/A;  multiple incarcerated hernias with mesh  . Other surgical history     mutiple ab surgeries for abdominal hernia  . Other surgical history     multiple dilation of esophagus  . Other surgical history     right lens implant 07/2011, left lens implant 10/2010  . Other surgical history     right breast bx=benign    Family History  Problem Relation Age of Onset  . Crohn's disease Mother   . Colitis Mother     Crohns  . Anesthesia problems Mother   . Cancer Father     skin  . Diabetes Father   . Heart disease Father   . Melanoma Father   . Coronary artery disease Father   . Diabetes Sister   . Depression Maternal Uncle   . Dementia Maternal Uncle   . Hypotension Neg Hx   . Malignant hyperthermia Neg Hx   . Pseudochol deficiency Neg Hx   . Colon cancer Paternal Uncle     ? if stomach or colon     History  Substance Use Topics  . Smoking status: Never Smoker   . Smokeless tobacco: Never Used  . Alcohol Use: No    OB History    Grav Para Term Preterm Abortions TAB SAB Ect Mult Living                  Review of Systems  Constitutional: Negative for fever and fatigue.  HENT: Positive for sore throat and rhinorrhea. Negative for congestion and postnasal drip.   Eyes: Negative for photophobia and visual disturbance.  Respiratory: Positive for cough. Negative for chest tightness, shortness of breath and wheezing.   Cardiovascular: Negative for chest pain, palpitations and leg swelling.  Gastrointestinal: Positive for nausea and abdominal pain. Negative for vomiting and diarrhea.  Genitourinary: Negative for urgency,  frequency and difficulty urinating.  Musculoskeletal: Negative for back pain and arthralgias.  Skin: Negative for rash and wound.  Neurological: Negative for weakness and headaches.  Psychiatric/Behavioral: Negative for confusion and agitation.    Allergies  Gemfibrozil; Latex; Penicillins; Adhesive; Codeine; and Digoxin and related  Home Medications   Current Outpatient Rx  Name  Route  Sig  Dispense  Refill  . ALPRAZOLAM 0.5 MG PO TABS   Oral   Take 0.5 mg by mouth at bedtime  as needed. For anxiety/sleep.         Marland Kitchen AMITRIPTYLINE HCL 100 MG PO TABS   Oral   Take 1 tablet (100 mg total) by mouth at bedtime.   90 tablet   0   . ATENOLOL 25 MG PO TABS   Oral   Take 37.5 mg by mouth 2 (two) times daily.         . B COMPLEX-C PO TABS   Oral   Take 1 tablet by mouth 2 (two) times daily.         Marland Kitchen CALCIUM CARBONATE 600 MG PO TABS   Oral   Take 600 mg by mouth daily.         Marland Kitchen CALCIUM CARBONATE-VITAMIN D 600-400 MG-UNIT PO TABS   Oral   Take 1 tablet by mouth 2 (two) times daily with a meal.          . CYCLOBENZAPRINE HCL 5 MG PO TABS   Oral   Take 5 mg by mouth 2 (two) times daily as needed. For fibromyalgia         . DILTIAZEM HCL ER 180 MG PO CP24   Oral   Take 180 mg by mouth daily.         Marland Kitchen FLUOXETINE HCL 20 MG PO TABS   Oral   Take 1 tablet (20 mg total) by mouth daily.   90 tablet   1   . FUROSEMIDE 20 MG PO TABS   Oral   Take 20 mg by mouth daily.         Marland Kitchen GABAPENTIN 100 MG PO CAPS   Oral   Take 1 capsule (100 mg total) by mouth 3 (three) times daily.   270 capsule   1   . HYDROCODONE-ACETAMINOPHEN 5-325MG  PREPACK   Oral   Take 6 tablets by mouth every 6 (six) hours as needed.   120 tablet   3     Note dose change and please do not let pt pick up  ...   . LEVOTHYROXINE SODIUM 75 MCG PO TABS   Oral   Take 75 mcg by mouth every morning.         Marland Kitchen LISINOPRIL 10 MG PO TABS   Oral   Take 10 mg by mouth daily.          Marland Kitchen  METFORMIN HCL 500 MG PO TABS   Oral   Take 1 tablet (500 mg total) by mouth 2 (two) times daily with a meal.   170 tablet   1   . OMEPRAZOLE 20 MG PO CPDR   Oral   Take 20 mg by mouth daily.         Marland Kitchen POTASSIUM CHLORIDE ER 10 MEQ PO TBCR   Oral   Take 10 mEq by mouth daily.         Marland Kitchen PRAVASTATIN SODIUM 40 MG PO TABS   Oral   Take 1 tablet (40 mg total) by mouth daily.   90 tablet   3     Please decrease current dose from 80 mg qhs to 40  ...   . WARFARIN SODIUM 2.5 MG PO TABS   Oral   Take 2.5-5 mg by mouth daily. Take 5 MG on Monday, Wednesday, and Friday. Take 2.5 MG on Tuesday, Thursday, Saturday, and Sunday.         . ALBUTEROL SULFATE HFA 108 (90 BASE) MCG/ACT IN AERS   Inhalation   Inhale 2 puffs  into the lungs every 6 (six) hours as needed. For shortness of breath         . BENZONATATE 200 MG PO CAPS   Oral   Take 1 capsule (200 mg total) by mouth 3 (three) times daily as needed for cough.   20 capsule   0   . LEVOFLOXACIN 500 MG PO TABS   Oral   Take 1.5 tablets (750 mg total) by mouth daily.   7 tablet   0     BP 110/65  Pulse 107  Temp 98.4 F (36.9 C) (Oral)  Resp 22  SpO2 92%  LMP 06/03/1978  Physical Exam  Nursing note and vitals reviewed. Constitutional: She is oriented to person, place, and time. She appears well-developed and well-nourished. No distress.  HENT:  Head: Normocephalic and atraumatic.  Mouth/Throat: Oropharynx is clear and moist.  Eyes: EOM are normal. Pupils are equal, round, and reactive to light.  Neck: Normal range of motion. Neck supple.  Cardiovascular: Normal heart sounds and intact distal pulses.  An irregularly irregular rhythm present. Tachycardia present.   Pulmonary/Chest: Effort normal and breath sounds normal. She has no wheezes. She has no rales.  Abdominal: Soft. Bowel sounds are normal. She exhibits distension. There is tenderness. There is no rebound and no guarding.  Musculoskeletal: Normal range of  motion. She exhibits no edema and no tenderness.  Lymphadenopathy:    She has no cervical adenopathy.  Neurological: She is alert and oriented to person, place, and time. She displays normal reflexes. No cranial nerve deficit. She exhibits normal muscle tone. Coordination normal.  Skin: Skin is warm and dry. No rash noted.  Psychiatric: She has a normal mood and affect. Her behavior is normal.    ED Course  Procedures (including critical care time)   Date: 04/08/2012  Rate: 127  Rhythm: atrial fibrillation with RVR  QRS Axis: left  Intervals: normal  ST/T Wave abnormalities: nonspecific ST changes  Conduction Disutrbances:none  Narrative Interpretation:   Old EKG Reviewed: unchanged    Labs Reviewed  PROTIME-INR - Abnormal; Notable for the following:    Prothrombin Time 16.3 (*)     All other components within normal limits  CBC WITH DIFFERENTIAL - Abnormal; Notable for the following:    RBC 3.84 (*)     HCT 35.8 (*)     All other components within normal limits  URINALYSIS, ROUTINE W REFLEX MICROSCOPIC - Abnormal; Notable for the following:    Nitrite POSITIVE (*)     Leukocytes, UA SMALL (*)     All other components within normal limits  COMPREHENSIVE METABOLIC PANEL - Abnormal; Notable for the following:    Glucose, Bld 119 (*)     Albumin 3.4 (*)     GFR calc non Af Amer 55 (*)     GFR calc Af Amer 63 (*)     All other components within normal limits  URINE MICROSCOPIC-ADD ON - Abnormal; Notable for the following:    Squamous Epithelial / LPF FEW (*)     Bacteria, UA FEW (*)     Casts HYALINE CASTS (*)     All other components within normal limits  URINE CULTURE   Dg Chest 2 View  04/08/2012  *RADIOLOGY REPORT*  Clinical Data: Cough  CHEST - 2 VIEW  Comparison: 09/02/2011  Findings: Chronic interstitial markings/bronchitic changes.  No focal consolidation. No pleural effusion or pneumothorax.  Mild cardiomegaly.  Mild degenerative changes of the visualized  thoracolumbar spine.  Cholecystectomy clips.  IMPRESSION: No evidence of acute cardiopulmonary disease.   Original Report Authenticated By: Charline Bills, M.D.    Ct Abdomen Pelvis W Contrast  04/09/2012  *RADIOLOGY REPORT*  Clinical Data: Abdominal pain and distention.  Nausea and diarrhea. Previous left inguinal hernia repair.  CT ABDOMEN AND PELVIS WITH CONTRAST  Technique:  Multidetector CT imaging of the abdomen and pelvis was performed following the standard protocol during bolus administration of intravenous contrast.  Contrast: OMNIPAQUE IOHEXOL 300 MG/ML  SOLN  Comparison: 08/06/2011  Findings: Images through the lung bases show patchy bibasilar airspace disease, suspicious for pneumonia.  Surgical clips seen from prior cholecystectomy.  The liver, pancreas, spleen, adrenal glands, and kidneys are normal in appearance.  No evidence of hydronephrosis.  No soft tissue masses or lymphadenopathy identified within the abdomen or pelvis.  Prior hysterectomy noted.  Adnexal regions are unremarkable.  No evidence of inflammatory process or abnormal fluid collections.  No evidence of dilated bowel loops.  Bilateral inguinal hernias are again seen containing only fat.  No evidence of herniated bowel loops.  IMPRESSION:  1.  Bibasilar airspace disease, suspicious for pneumonia. 2.  No acute findings within the abdomen or pelvis. 3.  Stable bilateral inguinal hernias containing only fat.   Original Report Authenticated By: Myles Rosenthal, M.D.      1. Community acquired pneumonia       MDM  72F here with URI symptoms for the last 5 days along with abdominal pain and distension for the last 2 days. She states she thinks she has been coughing so hard she "popped" a hernia back out. She has had multiple previous abdominal surgeries including several hernia repairs, hiatal hernia repira, cholecystectomy. No history of SBO. Exam as noted above. Does appears to be in afib with RVR. Due to recent infection  and apparent dry MM, will give IVF to see if that will reduce HR. If not may give meds to slow down. Will also obtain CT abd/pelvis to r/o obstruction, incarcerated hernia, perforation, or abdominal infection. Offered pain meds but patient refused. Given Zofran.  12:37 AM  CT abd/pelvis negative for intra-abdominal pathology but did show bibasilar pneumonia. Will dc with Levaquin. HR has improved to 105-110. Will give her home dose of atenolol now. She takes atenolol 25 mg BID at home. INR subtherapeutic 1.3 and pt reports she has not been complaint with taking her meds since she's been sick. Instructed her to restart her medication regimen and have her INR checked next week. Pt endorses understanding and stable for d/c home.  Also, urine with +nitrites and leuks. Will also d/c with Kelfex. Will f/u cultures.          Johnnette Gourd, MD 04/09/12 [phone]

## 2012-04-08 NOTE — ED Notes (Signed)
Patient transported to CT 

## 2012-04-08 NOTE — ED Notes (Addendum)
Reports cough and sob since Monday.  States she is able to cough up brown phlegm at times.  Also reports pain x 4-5 days to LLQ at surgical site from hernia repair. Reports generalized weakness and nausea.  Decreased PO intake.  Has not taken Coumadin in the last 3 days.   Pt placed on monitor at triage- afib with RVR.

## 2012-04-09 ENCOUNTER — Encounter (HOSPITAL_COMMUNITY): Payer: Self-pay | Admitting: Radiology

## 2012-04-09 MED ORDER — METOPROLOL TARTRATE 1 MG/ML IV SOLN: 10.0000 mg | Freq: Once | INTRAVENOUS | Status: DC

## 2012-04-09 MED ORDER — ATENOLOL 25 MG PO TABS
25.0000 mg | ORAL_TABLET | Freq: Once | ORAL | Status: AC
  Administered 2012-04-09: 25 mg via ORAL
  Filled 2012-04-09: qty 1

## 2012-04-09 MED ORDER — CEPHALEXIN 500 MG PO CAPS: 500.0000 mg | ORAL_CAPSULE | Freq: Two times a day (BID) | ORAL | Status: DC

## 2012-04-09 MED ORDER — BENZONATATE 200 MG PO CAPS: 200.0000 mg | ORAL_CAPSULE | Freq: Three times a day (TID) | ORAL | Status: DC | PRN

## 2012-04-09 MED ORDER — IOHEXOL 300 MG/ML  SOLN
100.0000 mL | Freq: Once | INTRAMUSCULAR | Status: AC | PRN
  Administered 2012-04-09: 100 mL via INTRAVENOUS

## 2012-04-09 MED ORDER — LEVOFLOXACIN 500 MG PO TABS: 750.0000 mg | ORAL_TABLET | Freq: Every day | ORAL | Status: DC

## 2012-04-09 NOTE — Discharge Instructions (Signed)

## 2012-04-09 NOTE — ED Provider Notes (Signed)
Medical screening examination/treatment/procedure(s) were conducted as a shared visit with non-physician practitioner(s) and myself.  I personally evaluated the patient during the encounter.  Patient with fever and cough, chest congestion. Chest x-ray shows pneumonia. Patient is a candidate for outpatient treatment based on her vital signs.  Gilda Crease, MD 04/09/12 [phone]

## 2012-04-10 LAB — URINE CULTURE: Colony Count: >=100000

## 2012-04-11 NOTE — ED Notes (Signed)
+   Urine Patient treated with Keflex-sensitive to same-chart appended per protocol MD. 

## 2012-04-14 ENCOUNTER — Encounter: Payer: Medicare Other | Admitting: Internal Medicine

## 2012-04-14 DIAGNOSIS — Z961 Presence of intraocular lens: Secondary | ICD-10-CM | POA: Diagnosis not present

## 2012-04-14 DIAGNOSIS — E119 Type 2 diabetes mellitus without complications: Secondary | ICD-10-CM | POA: Diagnosis not present

## 2012-04-15 ENCOUNTER — Telehealth: Payer: Self-pay | Admitting: Internal Medicine

## 2012-04-15 ENCOUNTER — Ambulatory Visit (INDEPENDENT_AMBULATORY_CARE_PROVIDER_SITE_OTHER): Payer: Medicare Other | Admitting: *Deleted

## 2012-04-15 DIAGNOSIS — I4891 Unspecified atrial fibrillation: Secondary | ICD-10-CM | POA: Diagnosis not present

## 2012-04-15 DIAGNOSIS — G459 Transient cerebral ischemic attack, unspecified: Secondary | ICD-10-CM

## 2012-04-15 DIAGNOSIS — Z7901 Long term (current) use of anticoagulants: Secondary | ICD-10-CM | POA: Diagnosis not present

## 2012-04-15 LAB — POCT INR: INR: 2.4

## 2012-04-15 NOTE — Telephone Encounter (Signed)
Spoke with patient.  She thought her appt was 04/15/12 and this is why she did not show to appt 04/14/12.  She had a recent ED visit and still does not feel well but will call and reschedule her appt with IM clinic.   Shirlee Latch MD [phone]

## 2012-04-28 ENCOUNTER — Ambulatory Visit (HOSPITAL_COMMUNITY)
Admission: RE | Admit: 2012-04-28 | Discharge: 2012-04-28 | Disposition: A | Discharge status: Home / Self Care | Payer: Medicare Other | Source: Ambulatory Visit | Attending: Internal Medicine | Admitting: Internal Medicine

## 2012-04-28 ENCOUNTER — Ambulatory Visit (INDEPENDENT_AMBULATORY_CARE_PROVIDER_SITE_OTHER): Payer: Medicare Other | Admitting: Internal Medicine

## 2012-04-28 ENCOUNTER — Encounter: Payer: Self-pay | Admitting: Internal Medicine

## 2012-04-28 VITALS — BP 123/63 | HR 84 | Temp 97.2°F | Ht 63.0 in | Wt 196.9 lb

## 2012-04-28 DIAGNOSIS — J984 Other disorders of lung: Secondary | ICD-10-CM | POA: Diagnosis not present

## 2012-04-28 DIAGNOSIS — R059 Cough, unspecified: Secondary | ICD-10-CM | POA: Insufficient documentation

## 2012-04-28 DIAGNOSIS — G8929 Other chronic pain: Secondary | ICD-10-CM

## 2012-04-28 DIAGNOSIS — J189 Pneumonia, unspecified organism: Secondary | ICD-10-CM

## 2012-04-28 DIAGNOSIS — I4891 Unspecified atrial fibrillation: Secondary | ICD-10-CM | POA: Diagnosis not present

## 2012-04-28 DIAGNOSIS — F3289 Other specified depressive episodes: Secondary | ICD-10-CM | POA: Diagnosis not present

## 2012-04-28 DIAGNOSIS — K219 Gastro-esophageal reflux disease without esophagitis: Secondary | ICD-10-CM

## 2012-04-28 DIAGNOSIS — R079 Chest pain, unspecified: Secondary | ICD-10-CM | POA: Diagnosis not present

## 2012-04-28 DIAGNOSIS — F329 Major depressive disorder, single episode, unspecified: Secondary | ICD-10-CM | POA: Diagnosis not present

## 2012-04-28 DIAGNOSIS — R05 Cough: Secondary | ICD-10-CM

## 2012-04-28 DIAGNOSIS — E039 Hypothyroidism, unspecified: Secondary | ICD-10-CM

## 2012-04-28 DIAGNOSIS — Z7901 Long term (current) use of anticoagulants: Secondary | ICD-10-CM

## 2012-04-28 DIAGNOSIS — IMO0001 Reserved for inherently not codable concepts without codable children: Secondary | ICD-10-CM

## 2012-04-28 DIAGNOSIS — F32A Depression, unspecified: Secondary | ICD-10-CM

## 2012-04-28 DIAGNOSIS — E119 Type 2 diabetes mellitus without complications: Secondary | ICD-10-CM | POA: Diagnosis not present

## 2012-04-28 DIAGNOSIS — E785 Hyperlipidemia, unspecified: Secondary | ICD-10-CM

## 2012-04-28 DIAGNOSIS — I10 Essential (primary) hypertension: Secondary | ICD-10-CM | POA: Diagnosis not present

## 2012-04-28 LAB — BASIC METABOLIC PANEL
BUN: 17 mg/dL (ref 6–23)
CO2: 30 mEq/L (ref 19–32)
Calcium: 9.7 mg/dL (ref 8.4–10.5)
Chloride: 104 mEq/L (ref 96–112)
Creat: 0.87 mg/dL (ref 0.50–1.10)
Glucose, Bld: 152 mg/dL — ABNORMAL HIGH (ref 70–99)
Potassium: 4.2 mEq/L (ref 3.5–5.3)
Sodium: 142 mEq/L (ref 135–145)

## 2012-04-28 LAB — GLUCOSE, CAPILLARY: Glucose-Capillary: 154 mg/dL — ABNORMAL HIGH (ref 70–99)

## 2012-04-28 LAB — PROTIME-INR
INR: 2.71 — ABNORMAL HIGH (ref <1.50)
Prothrombin Time: 27.4 seconds — ABNORMAL HIGH (ref 11.6–15.2)

## 2012-04-28 LAB — POCT GLYCOSYLATED HEMOGLOBIN (HGB A1C): Hemoglobin A1C: 6.1

## 2012-04-28 MED ORDER — WARFARIN SODIUM 2.5 MG PO TABS: 2.5000 mg | ORAL_TABLET | Freq: Every day | ORAL | Status: DC

## 2012-04-28 MED ORDER — LEVOTHYROXINE SODIUM 75 MCG PO TABS: 75.0000 ug | ORAL_TABLET | Freq: Every morning | ORAL | Status: DC

## 2012-04-28 MED ORDER — POTASSIUM CHLORIDE ER 10 MEQ PO TBCR: 10.0000 meq | EXTENDED_RELEASE_TABLET | Freq: Every day | ORAL | Status: DC

## 2012-04-28 MED ORDER — ATENOLOL 25 MG PO TABS: 37.5000 mg | ORAL_TABLET | Freq: Two times a day (BID) | ORAL | Status: DC

## 2012-04-28 MED ORDER — METFORMIN HCL 500 MG PO TABS: 500.0000 mg | ORAL_TABLET | Freq: Every day | ORAL | Status: DC

## 2012-04-28 MED ORDER — GABAPENTIN 100 MG PO CAPS: 100.0000 mg | ORAL_CAPSULE | Freq: Three times a day (TID) | ORAL | Status: DC

## 2012-04-28 MED ORDER — DILTIAZEM HCL ER 180 MG PO CP24: 180.0000 mg | ORAL_CAPSULE | Freq: Every day | ORAL | Status: DC

## 2012-04-28 MED ORDER — OMEPRAZOLE 20 MG PO CPDR: 20.0000 mg | DELAYED_RELEASE_CAPSULE | Freq: Every day | ORAL | Status: DC

## 2012-04-28 MED ORDER — LISINOPRIL 10 MG PO TABS: 10.0000 mg | ORAL_TABLET | Freq: Every day | ORAL | Status: DC

## 2012-04-28 MED ORDER — FLUOXETINE HCL 20 MG PO TABS: 20.0000 mg | ORAL_TABLET | Freq: Every day | ORAL | Status: DC

## 2012-04-28 MED ORDER — PRAVASTATIN SODIUM 40 MG PO TABS: 40.0000 mg | ORAL_TABLET | Freq: Every day | ORAL | Status: DC

## 2012-04-28 MED ORDER — ALBUTEROL SULFATE HFA 108 (90 BASE) MCG/ACT IN AERS: 2.0000 | INHALATION_SPRAY | Freq: Four times a day (QID) | RESPIRATORY_TRACT | Status: DC | PRN

## 2012-04-28 MED ORDER — AMITRIPTYLINE HCL 100 MG PO TABS: 100.0000 mg | ORAL_TABLET | Freq: Every day | ORAL | Status: DC

## 2012-04-28 MED ORDER — FUROSEMIDE 20 MG PO TABS: 20.0000 mg | ORAL_TABLET | Freq: Every day | ORAL | Status: DC

## 2012-04-28 NOTE — Assessment & Plan Note (Signed)
Followed by cardiology for INR  Checked INR today Continue Coumadin MWF 5 mg and other days 2.5 mg

## 2012-04-28 NOTE — Assessment & Plan Note (Addendum)
CAP 04/08/12  Completed Keflex and Levaquin Still with cough Repeat CXR normal  RTC if febrile, chills, not feeling well to reassess  Will Rx refill Albuterol inhaler to see if helps with cough Continue Tessalon prn

## 2012-04-28 NOTE — Progress Notes (Signed)
  Subjective:    Patient ID: [name], female    DOB: 03-22-1950, 62 y.o.   MRN: [id number]  HPI Comments: 62 y.o female PMH DM (cbg 154, HA1C 6.1% today), controlled HTN 123/63, AF on chronic coumadin significant other PMH.  Presents for Rx refills medications and f/u for CAP diagnosed 04/08/12 ED visit.  See ROS.   Patient needs #90 day supply on all medications today   She reports she hit her left index finger the day before yesterday and has noticed swelling near the knuckle, no decreased ROM, pain 4/10 and improving.    HM: Recent f/u with eye MD ~2 weeks ago normal.       Review of Systems  Constitutional: Positive for chills. Negative for fever and appetite change.  Respiratory: Positive for cough.        Cough with dark brown yellow sputum  Cardiovascular: Positive for palpitations. Negative for leg swelling.       Chronic intermittent palpitations  2/19 chest discomfort with deep breathing lasting 4-5 hours.   Gastrointestinal: Positive for constipation. Negative for nausea and vomiting.       Resolved nausea yesterday  Psychiatric/Behavioral: Positive for sleep disturbance.       Decreased sleep due to cough or being worried   All other systems reviewed and are negative.       Objective:   Physical Exam  Nursing note and vitals reviewed. Constitutional: She is oriented to person, place, and time. Vital signs are normal. She appears well-developed and well-nourished. She is cooperative. No distress.  HENT:  Head: Normocephalic and atraumatic.  Mouth/Throat: Oropharynx is clear and moist and mucous membranes are normal. Abnormal dentition. No oropharyngeal exudate.  Broken cracked tooth with dental carie top right  Eyes: Conjunctivae are normal. Pupils are equal, round, and reactive to light. Right eye exhibits no discharge. Left eye exhibits no discharge. No scleral icterus.  Cardiovascular: Normal rate and normal heart sounds.  An irregular rhythm present.  No  murmur heard. AF rate controlled   Pulmonary/Chest: Effort normal and breath sounds normal. No respiratory distress. She has no wheezes.  Abdominal: Soft. Bowel sounds are normal. There is no tenderness.  Neurological: She is alert and oriented to person, place, and time. Gait normal.  Skin: Skin is warm, dry and intact. No rash noted. She is not diaphoretic.  Psychiatric: She has a normal mood and affect. Her speech is normal and behavior is normal. Judgment and thought content normal. Cognition and memory are normal.          Assessment & Plan:  F/u in 3 months, sooner if needed

## 2012-04-28 NOTE — Assessment & Plan Note (Signed)
Rx refill for BP medications

## 2012-04-28 NOTE — Patient Instructions (Addendum)
General Instructions:  Follow up in 3 months, sooner if needed  Try to exercise more as we discussed  Pick up your refills from the pharmacy  Treatment Goals:  Goals (1 Years of Data) as of 04/28/12         As of Today 04/09/12 04/09/12 04/08/12 04/08/12     Blood Pressure    . Blood Pressure < 140/90  123/63 109/63 109/54 110/65 125/85     Result Component    . HEMOGLOBIN A1C < 7.0  6.1          Progress Toward Treatment Goals:  Treatment Goal 04/28/2012  Hemoglobin A1C at goal  Blood pressure at goal    Self Care Goals & Plans:  Self Care Goal 04/28/2012  Manage my medications take my medicines as prescribed; bring my medications to every visit; refill my medications on time  Monitor my health keep track of my blood pressure; keep track of my weight  Eat healthy foods drink diet soda or water instead of juice or soda; eat more vegetables; eat foods that are low in salt  Be physically active park at the far end of the parking lot; find workout friends; find an activity I enjoy    Home Blood Glucose Monitoring 02/25/2012  Check my blood sugar (No Data)     Care Management & Community Referrals:  Referral 04/28/2012  Referrals made for care management support none needed  Referrals made to community resources (No Data)       Cough, Adult  A cough is a reflex that helps clear your throat and airways. It can help heal the body or may be a reaction to an irritated airway. A cough may only last 2 or 3 weeks (acute) or may last more than 8 weeks (chronic).  CAUSES Acute cough:  Viral or bacterial infections. Chronic cough:  Infections.  Allergies.  Asthma.  Post-nasal drip.  Smoking.  Heartburn or acid reflux.  Some medicines.  Chronic lung problems (COPD).  Cancer. SYMPTOMS   Cough.  Fever.  Chest pain.  Increased breathing rate.  High-pitched whistling sound when breathing (wheezing).  Colored mucus that you cough up (sputum). TREATMENT   A  bacterial cough may be treated with antibiotic medicine.  A viral cough must run its course and will not respond to antibiotics.  Your caregiver may recommend other treatments if you have a chronic cough. HOME CARE INSTRUCTIONS   Only take over-the-counter or prescription medicines for pain, discomfort, or fever as directed by your caregiver. Use cough suppressants only as directed by your caregiver.  Use a cold steam vaporizer or humidifier in your bedroom or home to help loosen secretions.  Sleep in a semi-upright position if your cough is worse at night.  Rest as needed.  Stop smoking if you smoke. SEEK IMMEDIATE MEDICAL CARE IF:   You have pus in your sputum.  Your cough starts to worsen.  You cannot control your cough with suppressants and are losing sleep.  You begin coughing up blood.  You have difficulty breathing.  You develop pain which is getting worse or is uncontrolled with medicine.  You have a fever. MAKE SURE YOU:   Understand these instructions.  Will watch your condition.  Will get help right away if you are not doing well or get worse. Document Released: 08/22/2010 Document Revised: 05/18/2011 Document Reviewed: 08/22/2010 [facility] Patient Information 2013 [place], [place].  Pneumonia, Adult Pneumonia is an infection of the lungs.  CAUSES Pneumonia may  be caused by bacteria or a virus. Usually, these infections are caused by breathing infectious particles into the lungs (respiratory tract). SYMPTOMS   Cough.  Fever.  Chest pain.  Increased rate of breathing.  Wheezing.  Mucus production. DIAGNOSIS  If you have the common symptoms of pneumonia, your caregiver will typically confirm the diagnosis with a chest X-ray. The X-ray will show an abnormality in the lung (pulmonary infiltrate) if you have pneumonia. Other tests of your blood, urine, or sputum may be done to find the specific cause of your pneumonia. Your caregiver may also do tests  (blood gases or pulse oximetry) to see how well your lungs are working. TREATMENT  Some forms of pneumonia may be spread to other people when you cough or sneeze. You may be asked to wear a mask before and during your exam. Pneumonia that is caused by bacteria is treated with antibiotic medicine. Pneumonia that is caused by the influenza virus may be treated with an antiviral medicine. Most other viral infections must run their course. These infections will not respond to antibiotics.  PREVENTION A pneumococcal shot (vaccine) is available to prevent a common bacterial cause of pneumonia. This is usually suggested for:  People over 71 years old.  Patients on chemotherapy.  People with chronic lung problems, such as bronchitis or emphysema.  People with immune system problems. If you are over 65 or have a high risk condition, you may receive the pneumococcal vaccine if you have not received it before. In some countries, a routine influenza vaccine is also recommended. This vaccine can help prevent some cases of pneumonia.You may be offered the influenza vaccine as part of your care. If you smoke, it is time to quit. You may receive instructions on how to stop smoking. Your caregiver can provide medicines and counseling to help you quit. HOME CARE INSTRUCTIONS   Cough suppressants may be used if you are losing too much rest. However, coughing protects you by clearing your lungs. You should avoid using cough suppressants if you can.  Your caregiver may have prescribed medicine if he or she thinks your pneumonia is caused by a bacteria or influenza. Finish your medicine even if you start to feel better.  Your caregiver may also prescribe an expectorant. This loosens the mucus to be coughed up.  Only take over-the-counter or prescription medicines for pain, discomfort, or fever as directed by your caregiver.  Do not smoke. Smoking is a common cause of bronchitis and can contribute to pneumonia.  If you are a smoker and continue to smoke, your cough may last several weeks after your pneumonia has cleared.  A cold steam vaporizer or humidifier in your room or home may help loosen mucus.  Coughing is often worse at night. Sleeping in a semi-upright position in a recliner or using a couple pillows under your head will help with this.  Get rest as you feel it is needed. Your body will usually let you know when you need to rest. SEEK IMMEDIATE MEDICAL CARE IF:   Your illness becomes worse. This is especially true if you are elderly or weakened from any other disease.  You cannot control your cough with suppressants and are losing sleep.  You begin coughing up blood.  You develop pain which is getting worse or is uncontrolled with medicines.  You have a fever.  Any of the symptoms which initially brought you in for treatment are getting worse rather than better.  You develop shortness of breath  or chest pain. MAKE SURE YOU:   Understand these instructions.  Will watch your condition.  Will get help right away if you are not doing well or get worse. Document Released: 02/23/2005 Document Revised: 05/18/2011 Document Reviewed: 05/15/2010 [facility] Patient Information 2013 [place], [place].

## 2012-05-27 ENCOUNTER — Ambulatory Visit (INDEPENDENT_AMBULATORY_CARE_PROVIDER_SITE_OTHER): Payer: Medicare Other | Admitting: *Deleted

## 2012-05-27 DIAGNOSIS — G459 Transient cerebral ischemic attack, unspecified: Secondary | ICD-10-CM

## 2012-05-27 DIAGNOSIS — Z7901 Long term (current) use of anticoagulants: Secondary | ICD-10-CM | POA: Diagnosis not present

## 2012-05-27 DIAGNOSIS — I4891 Unspecified atrial fibrillation: Secondary | ICD-10-CM | POA: Diagnosis not present

## 2012-05-27 LAB — POCT INR: INR: 3.2

## 2012-06-24 ENCOUNTER — Ambulatory Visit (INDEPENDENT_AMBULATORY_CARE_PROVIDER_SITE_OTHER): Payer: Medicare Other | Admitting: *Deleted

## 2012-06-24 DIAGNOSIS — I4891 Unspecified atrial fibrillation: Secondary | ICD-10-CM

## 2012-06-24 DIAGNOSIS — Z7901 Long term (current) use of anticoagulants: Secondary | ICD-10-CM | POA: Diagnosis not present

## 2012-06-24 DIAGNOSIS — G459 Transient cerebral ischemic attack, unspecified: Secondary | ICD-10-CM

## 2012-07-05 ENCOUNTER — Other Ambulatory Visit: Payer: Self-pay | Admitting: *Deleted

## 2012-07-05 DIAGNOSIS — IMO0001 Reserved for inherently not codable concepts without codable children: Secondary | ICD-10-CM

## 2012-07-05 DIAGNOSIS — G8929 Other chronic pain: Secondary | ICD-10-CM

## 2012-07-05 DIAGNOSIS — F3289 Other specified depressive episodes: Secondary | ICD-10-CM

## 2012-07-05 DIAGNOSIS — I4891 Unspecified atrial fibrillation: Secondary | ICD-10-CM

## 2012-07-05 DIAGNOSIS — F329 Major depressive disorder, single episode, unspecified: Secondary | ICD-10-CM

## 2012-07-05 MED ORDER — DILTIAZEM HCL ER 180 MG PO CP24: 180.0000 mg | ORAL_CAPSULE | Freq: Every day | ORAL | Status: DC

## 2012-07-05 MED ORDER — GABAPENTIN 100 MG PO CAPS: 100.0000 mg | ORAL_CAPSULE | Freq: Three times a day (TID) | ORAL | Status: DC

## 2012-07-05 MED ORDER — AMITRIPTYLINE HCL 100 MG PO TABS: 100.0000 mg | ORAL_TABLET | Freq: Every day | ORAL | Status: DC

## 2012-07-15 ENCOUNTER — Ambulatory Visit (INDEPENDENT_AMBULATORY_CARE_PROVIDER_SITE_OTHER): Payer: Medicare Other | Admitting: Pharmacist

## 2012-07-15 ENCOUNTER — Encounter (INDEPENDENT_AMBULATORY_CARE_PROVIDER_SITE_OTHER): Payer: Medicare Other

## 2012-07-15 DIAGNOSIS — Z7901 Long term (current) use of anticoagulants: Secondary | ICD-10-CM | POA: Diagnosis not present

## 2012-07-15 DIAGNOSIS — I4891 Unspecified atrial fibrillation: Secondary | ICD-10-CM | POA: Diagnosis not present

## 2012-07-15 DIAGNOSIS — G459 Transient cerebral ischemic attack, unspecified: Secondary | ICD-10-CM | POA: Diagnosis not present

## 2012-07-15 DIAGNOSIS — I6529 Occlusion and stenosis of unspecified carotid artery: Secondary | ICD-10-CM | POA: Diagnosis not present

## 2012-07-15 LAB — POCT INR: INR: 2.5

## 2012-07-25 ENCOUNTER — Ambulatory Visit (INDEPENDENT_AMBULATORY_CARE_PROVIDER_SITE_OTHER): Payer: Medicare Other | Admitting: Internal Medicine

## 2012-07-25 ENCOUNTER — Encounter: Payer: Self-pay | Admitting: Internal Medicine

## 2012-07-25 VITALS — BP 124/72 | HR 93 | Temp 98.3°F | Ht 64.0 in | Wt 198.2 lb

## 2012-07-25 DIAGNOSIS — F329 Major depressive disorder, single episode, unspecified: Secondary | ICD-10-CM | POA: Diagnosis not present

## 2012-07-25 DIAGNOSIS — F3289 Other specified depressive episodes: Secondary | ICD-10-CM

## 2012-07-25 DIAGNOSIS — I10 Essential (primary) hypertension: Secondary | ICD-10-CM | POA: Diagnosis not present

## 2012-07-25 DIAGNOSIS — R51 Headache: Secondary | ICD-10-CM | POA: Diagnosis not present

## 2012-07-25 DIAGNOSIS — E119 Type 2 diabetes mellitus without complications: Secondary | ICD-10-CM | POA: Diagnosis not present

## 2012-07-25 DIAGNOSIS — R269 Unspecified abnormalities of gait and mobility: Secondary | ICD-10-CM | POA: Diagnosis not present

## 2012-07-25 DIAGNOSIS — R05 Cough: Secondary | ICD-10-CM

## 2012-07-25 DIAGNOSIS — G8929 Other chronic pain: Secondary | ICD-10-CM

## 2012-07-25 DIAGNOSIS — I4891 Unspecified atrial fibrillation: Secondary | ICD-10-CM

## 2012-07-25 DIAGNOSIS — M549 Dorsalgia, unspecified: Secondary | ICD-10-CM

## 2012-07-25 DIAGNOSIS — R059 Cough, unspecified: Secondary | ICD-10-CM

## 2012-07-25 LAB — GLUCOSE, CAPILLARY: Glucose-Capillary: 136 mg/dL — ABNORMAL HIGH (ref 70–99)

## 2012-07-25 LAB — POCT GLYCOSYLATED HEMOGLOBIN (HGB A1C): Hemoglobin A1C: 6

## 2012-07-25 MED ORDER — POTASSIUM CHLORIDE ER 10 MEQ PO TBCR: 10.0000 meq | EXTENDED_RELEASE_TABLET | Freq: Every day | ORAL | Status: DC

## 2012-07-25 MED ORDER — LOSARTAN POTASSIUM 25 MG PO TABS: 12.5000 mg | ORAL_TABLET | Freq: Every day | ORAL | Status: DC

## 2012-07-25 NOTE — Patient Instructions (Addendum)
General Instructions:  Follow up 1-2 months sooner if needed Try to stop Lisinopril and see if this helps cough. I will start you on another medication for blood pressure   Treatment Goals:  Goals (1 Years of Data) as of 07/25/12         As of Today 04/28/12 04/09/12 04/09/12 04/08/12     Blood Pressure    . Blood Pressure < 140/90  124/72 123/63 109/63 109/54 110/65     Result Component    . HEMOGLOBIN A1C < 7.0  6.0 6.1         Progress Toward Treatment Goals:  Treatment Goal 07/25/2012  Hemoglobin A1C at goal  Blood pressure at goal  Prevent falls unchanged    Self Care Goals & Plans:  Self Care Goal 07/25/2012  Manage my medications take my medicines as prescribed; bring my medications to every visit; refill my medications on time  Monitor my health keep track of my weight; keep track of my blood pressure  Eat healthy foods drink diet soda or water instead of juice or soda; eat more vegetables; eat baked foods instead of fried foods; eat fruit for snacks and desserts; eat smaller portions  Be physically active park at the far end of the parking lot; find an activity I enjoy  Prevent falls use home fall prevention checklist to improve safety  Meeting treatment goals maintain the current self-care plan    Home Blood Glucose Monitoring 07/25/2012  Check my blood sugar no home glucose monitoring  When to check my blood sugar N/A     Care Management & Community Referrals:  Referral 07/25/2012  Referrals made for care management support none needed  Referrals made to community resources none       Nausea, Adult Nausea is the feeling that you have an upset stomach or have to vomit. Nausea by itself is not likely a serious concern, but it may be an early sign of more serious medical problems. As nausea gets worse, it can lead to vomiting. If vomiting develops, there is the risk of dehydration.  CAUSES   Viral infections.  Food  poisoning.  Medicines.  Pregnancy.  Motion sickness.  Migraine headaches.  Emotional distress.  Severe pain from any source.  Alcohol intoxication. HOME CARE INSTRUCTIONS  Get plenty of rest.  Ask your caregiver about specific rehydration instructions.  Eat small amounts of food and sip liquids more often.  Take all medicines as told by your caregiver. SEEK MEDICAL CARE IF:  You have not improved after 2 days, or you get worse.  You have a headache. SEEK IMMEDIATE MEDICAL CARE IF:   You have a fever.  You faint.  You keep vomiting or have blood in your vomit.  You are extremely weak or dehydrated.  You have dark or bloody stools.  You have severe chest or abdominal pain. MAKE SURE YOU:  Understand these instructions.  Will watch your condition.  Will get help right away if you are not doing well or get worse. Document Released: 04/02/2004 Document Revised: 05/18/2011 Document Reviewed: 11/05/2010 [facility] Patient Information 2013 [place], [place].  Fall Prevention, Elderly Falls are the leading cause of injuries, accidents, and accidental deaths in people over the age of 59. Falling is a real threat to your ability to live on your own. CAUSES   Poor eyesight or poor hearing can make you more likely to fall.   Illnesses and physical conditions can affect your strength and balance.   Poor  lighting, throw rugs and pets in your home can make you more likely to trip or slip.   The side effects of some medicines can upset your balance and lead to falling. These include medicines for depression, sleep problems, high blood pressure, diabetes, and heart conditions.  PREVENTION  Be sure your home is as safe as possible. Here are some tips:  Wear shoes with non-skid soles (not house slippers).   Be sure your home and outside area are well lit.   Use night lights throughout your house, including hallways and stairways.   Remove clutter and clean up spills on  floors and walkways.   Remove throw rugs or fasten them to the floor with carpet tape. Tack down carpet edges.   Do not place electrical cords across pathways.   Install grab bars in your bathtub, shower, and toilet area. Towel bars should not be used as a grab bar.   Install handrails on both sides of stairways.   Do not climb on stools or stepladders. Get someone else to help with jobs that require climbing.   Do not wax your floors at all, or use a non-skid wax.   Repair uneven or unsafe sidewalks, walkways or stairs.   Keep frequently used items within reach.   Be aware of pets so you do not trip.  Get regular check-ups from your doctor, and take good care of yourself:  Have your eyes checked every year for vision changes, cataracts, glaucoma, and other eye problems. Wear eyeglasses as directed.   Have your hearing checked every 2 years, or anytime you or others think that you cannot hear well. Use hearing aids as directed.   See your caregiver if you have foot pain or corns. Sore feet can contribute to falls.   Let your caregiver know if a medicine is making you feel dizzy or making you lose your balance.   Use a cane, walker, or wheelchair as directed. Use walker or wheelchair brakes when getting in and out.   When you get up from bed, sit on the side of the bed for 1 to 2 minutes before you stand up. This will give your blood pressure time to adjust, and you will feel less dizzy.   If you need to go to the bathroom often, consider using a bedside commode.  Keep your body in good shape:  Get regular exercise, especially walking.   Do exercises to strengthen the muscles you use for walking and lifting.   Do not smoke.   Minimize use of alcohol.  SEEK IMMEDIATE MEDICAL CARE IF:   You feel dizzy, weak, or unsteady on your feet.   You feel confused.   You fall.  Document Released: 02/23/2005 Document Revised: 02/12/2011 Document Reviewed: 08/20/2006 [facility]  Patient Information 2012 [place], [place]. Dizziness Dizziness is a common problem. It is a feeling of unsteadiness or lightheadedness. You may feel like you are about to faint. Dizziness can lead to injury if you stumble or fall. A person of any age group can suffer from dizziness, but dizziness is more common in older adults. CAUSES  Dizziness can be caused by many different things, including:  Middle ear problems.  Standing for too long.  Infections.  An allergic reaction.  Aging.  An emotional response to something, such as the sight of blood.  Side effects of medicines.  Fatigue.  Problems with circulation or blood pressure.  Excess use of alcohol, medicines, or illegal drug use.  Breathing too fast (  hyperventilation).  An arrhythmia or problems with your heart rhythm.  Low red blood cell count (anemia).  Pregnancy.  Vomiting, diarrhea, fever, or other illnesses that cause dehydration.  Diseases or conditions such as Parkinson's disease, high blood pressure (hypertension), diabetes, and thyroid problems.  Exposure to extreme heat. DIAGNOSIS  To find the cause of your dizziness, your caregiver may do a physical exam, lab tests, radiologic imaging scans, or an electrocardiography test (ECG).  TREATMENT  Treatment of dizziness depends on the cause of your symptoms and can vary greatly. HOME CARE INSTRUCTIONS   Drink enough fluids to keep your urine clear or pale yellow. This is especially important in very hot weather. In the elderly, it is also important in cold weather.  If your dizziness is caused by medicines, take them exactly as directed. When taking blood pressure medicines, it is especially important to get up slowly.  Rise slowly from chairs and steady yourself until you feel okay.  In the morning, first sit up on the side of the bed. When this seems okay, stand slowly while holding onto something until you know your balance is fine.  If you need to stand  in one place for a long time, be sure to move your legs often. Tighten and relax the muscles in your legs while standing.  If dizziness continues to be a problem, have someone stay with you for a day or two. Do this until you feel you are well enough to stay alone. Have the person call your caregiver if he or she notices changes in you that are concerning.  Do not drive or use heavy machinery if you feel dizzy.  Do not drink alcohol. SEEK IMMEDIATE MEDICAL CARE IF:   Your dizziness or lightheadedness gets worse.  You feel nauseous or vomit.  You develop problems with talking, walking, weakness, or using your arms, hands, or legs.  You are not thinking clearly or you have difficulty forming sentences. It may take a friend or family member to determine if your thinking is normal.  You develop chest pain, abdominal pain, shortness of breath, or sweating.  Your vision changes.  You notice any bleeding.  You have side effects from medicine that seems to be getting worse rather than better. MAKE SURE YOU:   Understand these instructions.  Will watch your condition.  Will get help right away if you are not doing well or get worse. Document Released: 08/19/2000 Document Revised: 05/18/2011 Document Reviewed: 09/12/2010 [facility] Patient Information 2013 [place], [place].  Shortness of Breath Shortness of breath means you have trouble breathing. Shortness of breath may indicate that you have a medical problem. You should seek immediate medical care for shortness of breath. CAUSES   Not enough oxygen in the air (as with high altitudes or a smoke-filled room).  Short-term (acute) lung disease, including:  Infections, such as pneumonia.  Fluid in the lungs, such as heart failure.  A blood clot in the lungs (pulmonary embolism).  Long-term (chronic) lung diseases.  Heart disease (heart attack, angina, heart failure, and others).  Low red blood cells (anemia).  Poor physical  fitness. This can cause shortness of breath when you exercise.  Chest or back injuries or stiffness.  Being overweight.  Smoking.  Anxiety. This can make you feel like you are not getting enough air. DIAGNOSIS  Serious medical problems can usually be found during your physical exam. Tests may also be done to determine why you are having shortness of breath. Tests may  include:  Chest X-rays.  Lung function tests.  Blood tests.  Electrocardiography.  Exercise testing.  Echocardiography.  Imaging scans. Your caregiver may not be able to find a cause for your shortness of breath after your exam. In this case, it is important to have a follow-up exam with your caregiver as directed.  TREATMENT  Treatment for shortness of breath depends on the cause of your symptoms and can vary greatly. HOME CARE INSTRUCTIONS   Do not smoke. Smoking is a common cause of shortness of breath. If you smoke, ask for help to quit.  Avoid being around chemicals or things that may bother your breathing, such as paint fumes and dust.  Rest as needed. Slowly resume your usual activities.  If medicines were prescribed, take them as directed for the full length of time directed. This includes oxygen and any inhaled medicines.  Keep all follow-up appointments as directed by your caregiver. SEEK MEDICAL CARE IF:   Your condition does not improve in the time expected.  You have a hard time doing your normal activities even with rest.  You have any side effects or problems with the medicines prescribed.  You develop any new symptoms. SEEK IMMEDIATE MEDICAL CARE IF:   Your shortness of breath gets worse.  You feel lightheaded, faint, or develop a cough not controlled with medicines.  You start coughing up blood.  You have pain with breathing.  You have chest pain or pain in your arms, shoulders, or abdomen.  You have a fever.  You are unable to walk up stairs or exercise the way you normally  do. MAKE SURE YOU:  Understand these instructions.  Will watch your condition.  Will get help right away if you are not doing well or get worse. Document Released: 11/18/2000 Document Revised: 08/25/2011 Document Reviewed: 05/11/2011 [facility] Patient Information 2013 [place], [place].  Cough, Adult  A cough is a reflex that helps clear your throat and airways. It can help heal the body or may be a reaction to an irritated airway. A cough may only last 2 or 3 weeks (acute) or may last more than 8 weeks (chronic).  CAUSES Acute cough:  Viral or bacterial infections. Chronic cough:  Infections.  Allergies.  Asthma.  Post-nasal drip.  Smoking.  Heartburn or acid reflux.  Some medicines.  Chronic lung problems (COPD).  Cancer. SYMPTOMS   Cough.  Fever.  Chest pain.  Increased breathing rate.  High-pitched whistling sound when breathing (wheezing).  Colored mucus that you cough up (sputum). TREATMENT   A bacterial cough may be treated with antibiotic medicine.  A viral cough must run its course and will not respond to antibiotics.  Your caregiver may recommend other treatments if you have a chronic cough. HOME CARE INSTRUCTIONS   Only take over-the-counter or prescription medicines for pain, discomfort, or fever as directed by your caregiver. Use cough suppressants only as directed by your caregiver.  Use a cold steam vaporizer or humidifier in your bedroom or home to help loosen secretions.  Sleep in a semi-upright position if your cough is worse at night.  Rest as needed.  Stop smoking if you smoke. SEEK IMMEDIATE MEDICAL CARE IF:   You have pus in your sputum.  Your cough starts to worsen.  You cannot control your cough with suppressants and are losing sleep.  You begin coughing up blood.  You have difficulty breathing.  You develop pain which is getting worse or is uncontrolled with medicine.  You have  a fever. MAKE SURE YOU:    Understand these instructions.  Will watch your condition.  Will get help right away if you are not doing well or get worse. Document Released: 08/22/2010 Document Revised: 05/18/2011 Document Reviewed: 08/22/2010 [facility] Patient Information 2013 [place], [place].

## 2012-07-25 NOTE — Progress Notes (Signed)
Subjective:    Patient ID: [name], female    DOB: [birth year], 62 y.o.   MRN: [id number]  HPI Comments: 62 y.o PMH DM 2 (HA1C 6.0, cbg 126), dyslipidemia, stressors, financial difficulties, HTN, Afib on chronic Coumadin (INR 3.8-->2.5 on 07/15/12; 2.5 mg MWF and 1/2 2.5 mg other days followed by cardiology), fibromyalgia, anxiety/depression, chronic mid back pain, chronic h/a.  She presents for 3 mo f/u and medication refills.  She has multiple complaints some which are chronic sob intermittently (history of pneumonia Jan/Feb 2014 and 04/2012 CXR negative), cough (patient thinks cough is associated with her apartment which she has mold she thinks), right sided nose/nare skin cracked and bleeding intermittently, nauseated x 1 month last time was Friday prior to visit (not associated with cough; nausea associated with emotional stress), +dizziness intermittently, h/o falls prior to 04/2012.  She thinks her apt at [employer] has mold and is causing her to cough.  Her lease ends 10/2012 but she wants to end if sooner than later and requests a letter from me to try to get out of the lease.  Patient also states her entire body is numb x 2 weeks and she discusses having a history of seizures as a child and this sensation feeling similar.          Review of Systems  Respiratory: Positive for cough and shortness of breath.   Gastrointestinal: Positive for nausea and abdominal pain.       Resolved abdominal pain noted last night after eating watermelon and had abdominal bloating resolved.   Notices nausea with anxiety/stress   Musculoskeletal: Positive for back pain.       Chronic mid back pain  Skin:       +dry skin  Neurological: Positive for dizziness, numbness and headaches.       Entire body goes numb x 2 weeks h/o seizure as a child pt thinks maybe related   Psychiatric/Behavioral: Positive for dysphoric mood. The patient is nervous/anxious.        Finances make patient depressed.  She owes  a lot of bills        Objective:   Physical Exam  Nursing note and vitals reviewed. Constitutional: She is oriented to person, place, and time. Vital signs are normal. She appears well-developed and well-nourished. She is cooperative. No distress.  HENT:  Head: Normocephalic and atraumatic.  Nose:    Mouth/Throat: Oropharynx is clear and moist and mucous membranes are normal. No oropharyngeal exudate.  Eyes: Conjunctivae are normal. Pupils are equal, round, and reactive to light. Right eye exhibits no discharge. Left eye exhibits no discharge. No scleral icterus.  Cardiovascular: Normal rate and normal heart sounds.  A regularly irregular rhythm present.  No murmur heard. No lower extremity   Pulmonary/Chest: Effort normal and breath sounds normal. No respiratory distress. She has no wheezes.  Abdominal: Soft. Bowel sounds are normal. There is no tenderness.  Obese abdomen  Musculoskeletal:       Thoracic back: She exhibits tenderness.  Neurological: She is alert and oriented to person, place, and time. She has normal strength. Gait normal.  Neurologically intact   Skin: Skin is warm, dry and intact. No rash noted. She is not diaphoretic.  Psychiatric: She has a normal mood and affect. Her speech is normal and behavior is normal. Judgment and thought content normal. Cognition and memory are normal.          Assessment & Plan:  F/u in 1-3 months with  Dr. Shirlee Latch address cough, sob, nausea

## 2012-08-02 ENCOUNTER — Telehealth: Payer: Self-pay | Admitting: Cardiology

## 2012-08-02 NOTE — Telephone Encounter (Signed)
[facility].  She anticipates the need to pull a tooth but does not know for sure.  Since it is a free clinic, she does not know which dentist will be seeing her.  Explained that with simple extractions, usually okay to stay on Coumadin.  She is due to have INR checked next week.  Will reschedule to this week so we can ensure her INR is not supratherapeutic prior to procedure.

## 2012-08-02 NOTE — Telephone Encounter (Signed)
New Prob     Pt is having her tooth pulled on Friday. Would like to know if she should alter her routine for coumadin. Please call.

## 2012-08-04 ENCOUNTER — Ambulatory Visit (INDEPENDENT_AMBULATORY_CARE_PROVIDER_SITE_OTHER): Payer: Medicare Other | Admitting: *Deleted

## 2012-08-04 DIAGNOSIS — I4891 Unspecified atrial fibrillation: Secondary | ICD-10-CM | POA: Diagnosis not present

## 2012-08-04 DIAGNOSIS — G459 Transient cerebral ischemic attack, unspecified: Secondary | ICD-10-CM

## 2012-08-04 DIAGNOSIS — Z7901 Long term (current) use of anticoagulants: Secondary | ICD-10-CM

## 2012-08-04 LAB — POCT INR: INR: 1.9

## 2012-08-05 ENCOUNTER — Encounter: Payer: Self-pay | Admitting: Internal Medicine

## 2012-08-05 NOTE — Assessment & Plan Note (Signed)
Continue Vicodin and Flexeril prn

## 2012-08-05 NOTE — Assessment & Plan Note (Signed)
On chronic Coumadin. INR followed by cardiology/vascular 3.8-->2.5 07/15/12.  On Coumadin 2.5 mg MWF and 1/2 2.5 mg other days.

## 2012-08-05 NOTE — Assessment & Plan Note (Signed)
Chronic likely tension/stress  She is neurologically intact and on Elavil 50 mg qhs.

## 2012-08-05 NOTE — Assessment & Plan Note (Signed)
Patient thinks maybe related to mold in her new apt since Jan/Feb 2014. She had pneumonia in Jan/Feb and was treated with antibiotics with repeat CXR negative.

## 2012-08-05 NOTE — Assessment & Plan Note (Signed)
Fall Screening 04/28/2012 07/25/2012  Falls in the past year? Yes Yes  Number of falls in past year 1 2 or more  Was there an injury with Fall? No -  Risk Factor Category  - High Fall Risk      Assessment: Progress toward fall prevention goal:  unchanged Comments: no new falls since 04/2012   Plan: Fall prevention plans: given information for falls Educational resources provided: handout Self management tools provided:

## 2012-08-05 NOTE — Assessment & Plan Note (Signed)
Financial stressors trigger depression She may need to resume outpatient follow up with psychiatry, counseling  She is on Xanax, Amitriptyline, Prozac which are not controlling symptoms

## 2012-08-05 NOTE — Assessment & Plan Note (Addendum)
BP Readings from Last 3 Encounters:  07/25/12 124/72  04/28/12 123/63  04/09/12 109/63    Lab Results  Component Value Date   NA 142 04/28/2012   K 4.2 04/28/2012   CREATININE 0.87 04/28/2012    Assessment: Blood pressure control: controlled Progress toward BP goal:  at goal Comments: none  Plan: Medications:  continue current medications Educational resources provided: handout Self management tools provided:   Other plans: continue current medications except changed ACEI to ARB due to patient complaints of cough which can be side effect of ACEI at anytime of usage despite not having previously tolerated  Controlled

## 2012-08-05 NOTE — Assessment & Plan Note (Signed)
Lab Results  Component Value Date   HGBA1C 6.0 07/25/2012   HGBA1C 6.1 04/28/2012   HGBA1C 6.0 01/18/2012     Assessment: Diabetes control: good control (HgbA1C at goal) Progress toward A1C goal:  at goal Comments: none  Plan: Medications:  not on medications currently  Home glucose monitoring: Frequency: no home glucose monitoring Timing: N/A Instruction/counseling given: none Educational resources provided: handout Self management tools provided:   Other plans: none

## 2012-08-15 NOTE — Progress Notes (Signed)
Case discussed with Dr. Shirlee Latch immediately after the resident saw the patient.  We reviewed the residens history and exam and pertinent patient test results.  I agree with the assessment, diagnosis and plan of care documented in the resident's note.

## 2012-09-01 ENCOUNTER — Encounter: Payer: Self-pay | Admitting: Internal Medicine

## 2012-09-01 ENCOUNTER — Ambulatory Visit (INDEPENDENT_AMBULATORY_CARE_PROVIDER_SITE_OTHER): Payer: Medicare Other

## 2012-09-01 ENCOUNTER — Ambulatory Visit (INDEPENDENT_AMBULATORY_CARE_PROVIDER_SITE_OTHER): Payer: Medicare Other | Admitting: Internal Medicine

## 2012-09-01 VITALS — BP 109/66 | HR 101 | Temp 97.7°F | Ht 63.0 in | Wt 200.9 lb

## 2012-09-01 DIAGNOSIS — Z8639 Personal history of other endocrine, nutritional and metabolic disease: Secondary | ICD-10-CM

## 2012-09-01 DIAGNOSIS — R1011 Right upper quadrant pain: Secondary | ICD-10-CM

## 2012-09-01 DIAGNOSIS — G459 Transient cerebral ischemic attack, unspecified: Secondary | ICD-10-CM | POA: Diagnosis not present

## 2012-09-01 DIAGNOSIS — Z7901 Long term (current) use of anticoagulants: Secondary | ICD-10-CM | POA: Diagnosis not present

## 2012-09-01 DIAGNOSIS — Z862 Personal history of diseases of the blood and blood-forming organs and certain disorders involving the immune mechanism: Secondary | ICD-10-CM | POA: Diagnosis not present

## 2012-09-01 DIAGNOSIS — I4891 Unspecified atrial fibrillation: Secondary | ICD-10-CM | POA: Diagnosis not present

## 2012-09-01 LAB — COMPREHENSIVE METABOLIC PANEL
ALT: 15 U/L (ref 0–35)
AST: 19 U/L (ref 0–37)
Albumin: 3.7 g/dL (ref 3.5–5.2)
Alkaline Phosphatase: 93 U/L (ref 39–117)
BUN: 12 mg/dL (ref 6–23)
CO2: 28 mEq/L (ref 19–32)
Chloride: 105 mEq/L (ref 96–112)
Creat: 0.96 mg/dL (ref 0.50–1.10)
Potassium: 3.9 mEq/L (ref 3.5–5.3)
Sodium: 141 mEq/L (ref 135–145)
Total Bilirubin: 0.6 mg/dL (ref 0.3–1.2)
Total Protein: 6.4 g/dL (ref 6.0–8.3)

## 2012-09-01 LAB — GLUCOSE, CAPILLARY: Glucose-Capillary: 144 mg/dL — ABNORMAL HIGH (ref 70–99)

## 2012-09-01 LAB — POCT INR: INR: 2

## 2012-09-01 NOTE — Patient Instructions (Addendum)
Call you surgeon Dr. Derrell Lolling Return for Abdominal US 09/05/12 Monday in the morning.  Remember not to eat  If abdominal pain is worse go to the Emergency Department or if you are unable to keep food or liquids down  Abdominal Pain Abdominal pain can be caused by many things. Your caregiver decides the seriousness of your pain by an examination and possibly blood tests and X-rays. Many cases can be observed and treated at home. Most abdominal pain is not caused by a disease and will probably improve without treatment. However, in many cases, more time must pass before a clear cause of the pain can be found. Before that point, it may not be known if you need more testing, or if hospitalization or surgery is needed. HOME CARE INSTRUCTIONS   Do not take laxatives unless directed by your caregiver.  Take pain medicine only as directed by your caregiver.  Only take over-the-counter or prescription medicines for pain, discomfort, or fever as directed by your caregiver.  Try a clear liquid diet (broth, tea, or water) for as long as directed by your caregiver. Slowly move to a bland diet as tolerated. SEEK IMMEDIATE MEDICAL CARE IF:   The pain does not go away.  You have a fever.  You keep throwing up (vomiting).  The pain is felt only in portions of the abdomen. Pain in the right side could possibly be appendicitis. In an adult, pain in the left lower portion of the abdomen could be colitis or diverticulitis.  You pass bloody or black tarry stools. MAKE SURE YOU:   Understand these instructions.  Will watch your condition.  Will get help right away if you are not doing well or get worse. Document Released: 12/03/2004 Document Revised: 05/18/2011 Document Reviewed: 10/12/2007 [facility] Patient Information 2014 [place], [place].

## 2012-09-01 NOTE — Progress Notes (Signed)
  Subjective:    Patient ID: [name], female    DOB: 12-08-1950, 62 y.o.   MRN: [id number]  HPI Comments: 62 y.o female with multiple medical problems presents for right upper quadrant abdominal pain 8-9/10 feels like needles.  She states approx 1 week ago she ate a shrimp and started choking so she drank water and coughed the shrimp back up.  Since then she has had abdominal pain intermittently 2-3 days out of 7.  She denies nausea/vomiting/diarrhea.  Pain is worse with food her abdomen feels tight and lying down helps the pain.  She has intermittent dysphagia and has had esophageal dilation in the past.  She is also intermittently constipated but last BM was last night.  She denies dysuria.   She is intermittently coughing at night still and has not been using an Albuterol inhaler.  She states a doctor in the past ? If she had asthma.   Of note previous CT abdomen 04/2012 with b/l inguinal hernias with only fat.  She has had complicated repair of multiple inguinal hernias with mesh 07/2011 by Dr. Derrell Lolling but has not followed with him since.  CT also shows a fatty liver.    On Coumadin for chronic AF INR 09/01/12 2.0.    SH: She is in the process of getting her house 10/2012      Review of Systems  Gastrointestinal: Positive for constipation.       Objective:   Physical Exam  Nursing note and vitals reviewed. Constitutional: She is oriented to person, place, and time. Vital signs are normal. She appears well-developed and well-nourished. She is cooperative. No distress.  HENT:  Head: Normocephalic and atraumatic.  Mouth/Throat: Oropharynx is clear and moist. No oropharyngeal exudate.  Eyes: Conjunctivae are normal. Pupils are equal, round, and reactive to light. Right eye exhibits no discharge. Left eye exhibits no discharge. No scleral icterus.  Cardiovascular: Normal rate and normal heart sounds.  An irregularly irregular rhythm present.  No murmur heard. Pulmonary/Chest: Effort  normal and breath sounds normal. No respiratory distress. She has no wheezes.  Abdominal: Soft. Bowel sounds are normal. There is tenderness in the right upper quadrant. There is no rebound, no guarding and negative Murphy's sign.    obese  Neurological: She is alert and oriented to person, place, and time. Gait normal.  Skin: Skin is warm, dry and intact. No rash noted. She is not diaphoretic.     Psychiatric: She has a normal mood and affect. Her speech is normal and behavior is normal. Judgment and thought content normal. Cognition and memory are normal.          Assessment & Plan:  3 months sooner if needed pending CMET and US abdomen complete on 6/301/4

## 2012-09-01 NOTE — Assessment & Plan Note (Signed)
Previous GB removal.  Does not seem like SBO as patient passing stool, denies nausea/vomiting.  ? If hernia causing pain or etiology related to liver CMET, US abdomen complete Instructed patient to call Dr. Derrell Lolling as well given complicated abdominal surgery history in the past.  Instructed to go to the ED for warning signs significant pain, decreased oral intake, nausea, vomiting Will call with results of [place] 09/05/12 and CMET

## 2012-09-02 ENCOUNTER — Telehealth: Payer: Self-pay | Admitting: Dietician

## 2012-09-02 NOTE — Telephone Encounter (Signed)
Last eye exam was February 2014  By dr. Lita Mains. She okays [place] requesting results

## 2012-09-05 ENCOUNTER — Encounter: Payer: Self-pay | Admitting: Internal Medicine

## 2012-09-05 ENCOUNTER — Ambulatory Visit (HOSPITAL_COMMUNITY)
Admission: RE | Admit: 2012-09-05 | Discharge: 2012-09-05 | Disposition: A | Discharge status: Home / Self Care | Payer: Medicare Other | Source: Ambulatory Visit | Attending: Internal Medicine | Admitting: Internal Medicine

## 2012-09-05 DIAGNOSIS — K7689 Other specified diseases of liver: Secondary | ICD-10-CM | POA: Diagnosis not present

## 2012-09-05 DIAGNOSIS — R1011 Right upper quadrant pain: Secondary | ICD-10-CM | POA: Insufficient documentation

## 2012-09-05 DIAGNOSIS — Z9089 Acquired absence of other organs: Secondary | ICD-10-CM | POA: Diagnosis not present

## 2012-09-07 NOTE — Progress Notes (Signed)
Case discussed with Dr. McLean at the time of the visit.  We reviewed the resident's history and exam and pertinent patient test results.  I agree with the assessment, diagnosis, and plan of care documented in the resident's note.     

## 2012-09-15 ENCOUNTER — Other Ambulatory Visit: Payer: Self-pay

## 2012-09-19 ENCOUNTER — Telehealth: Payer: Self-pay | Admitting: Dietician

## 2012-09-19 NOTE — Telephone Encounter (Signed)
Dr. Lita Mains to fax results from recent eye exam

## 2012-09-23 ENCOUNTER — Encounter: Payer: Self-pay | Admitting: Internal Medicine

## 2012-09-29 ENCOUNTER — Ambulatory Visit (INDEPENDENT_AMBULATORY_CARE_PROVIDER_SITE_OTHER): Payer: Medicare Other | Admitting: *Deleted

## 2012-09-29 DIAGNOSIS — G459 Transient cerebral ischemic attack, unspecified: Secondary | ICD-10-CM | POA: Diagnosis not present

## 2012-09-29 DIAGNOSIS — I4891 Unspecified atrial fibrillation: Secondary | ICD-10-CM | POA: Diagnosis not present

## 2012-09-29 DIAGNOSIS — Z7901 Long term (current) use of anticoagulants: Secondary | ICD-10-CM

## 2012-09-29 LAB — POCT INR: INR: 2.1

## 2012-10-03 ENCOUNTER — Ambulatory Visit (INDEPENDENT_AMBULATORY_CARE_PROVIDER_SITE_OTHER): Payer: Medicare Other | Admitting: Internal Medicine

## 2012-10-03 ENCOUNTER — Encounter: Payer: Self-pay | Admitting: Internal Medicine

## 2012-10-03 VITALS — BP 114/74 | HR 93 | Temp 97.3°F | Ht 62.5 in | Wt 199.1 lb

## 2012-10-03 DIAGNOSIS — E119 Type 2 diabetes mellitus without complications: Secondary | ICD-10-CM

## 2012-10-03 DIAGNOSIS — F329 Major depressive disorder, single episode, unspecified: Secondary | ICD-10-CM | POA: Diagnosis not present

## 2012-10-03 DIAGNOSIS — F32A Depression, unspecified: Secondary | ICD-10-CM

## 2012-10-03 DIAGNOSIS — M549 Dorsalgia, unspecified: Secondary | ICD-10-CM | POA: Diagnosis not present

## 2012-10-03 DIAGNOSIS — K219 Gastro-esophageal reflux disease without esophagitis: Secondary | ICD-10-CM

## 2012-10-03 DIAGNOSIS — F3289 Other specified depressive episodes: Secondary | ICD-10-CM

## 2012-10-03 DIAGNOSIS — R1310 Dysphagia, unspecified: Secondary | ICD-10-CM

## 2012-10-03 DIAGNOSIS — I4891 Unspecified atrial fibrillation: Secondary | ICD-10-CM

## 2012-10-03 DIAGNOSIS — I10 Essential (primary) hypertension: Secondary | ICD-10-CM

## 2012-10-03 DIAGNOSIS — G8929 Other chronic pain: Secondary | ICD-10-CM | POA: Diagnosis not present

## 2012-10-03 LAB — GLUCOSE, CAPILLARY: Glucose-Capillary: 131 mg/dL — ABNORMAL HIGH (ref 70–99)

## 2012-10-03 NOTE — Assessment & Plan Note (Signed)
BP Readings from Last 3 Encounters:  10/03/12 114/74  09/01/12 109/66  07/25/12 124/72    Lab Results  Component Value Date   NA 141 09/01/2012   K 3.9 09/01/2012   CREATININE 0.96 09/01/2012    Assessment: Blood pressure control: controlled Progress toward BP goal:  at goal Comments: none  Plan: Medications:  continue current medications Educational resources provided: brochure;handout Self management tools provided: other (see comments) Other plans: no labs today recent labs end of 08/2012 normal

## 2012-10-03 NOTE — Patient Instructions (Addendum)
General Instructions: Please keep your appointment with [facility] for swallowing trouble Read all the information below Increase Prilosec to 40 mg daily  Follow up in 3-4 months, sooner if needed    DASH Diet The DASH diet stands for "Dietary Approaches to Stop Hypertension." It is a healthy eating plan that has been shown to reduce high blood pressure (hypertension) in as little as 14 days, while also possibly providing other significant health benefits. These other health benefits include reducing the risk of breast cancer after menopause and reducing the risk of type 2 diabetes, heart disease, colon cancer, and stroke. Health benefits also include weight loss and slowing kidney failure in patients with chronic kidney disease.  DIET GUIDELINES  Limit salt (sodium). Your diet should contain less than 1500 mg of sodium daily.  Limit refined or processed carbohydrates. Your diet should include mostly whole grains. Desserts and added sugars should be used sparingly.  Include small amounts of heart-healthy fats. These types of fats include nuts, oils, and tub margarine. Limit saturated and trans fats. These fats have been shown to be harmful in the body. CHOOSING FOODS  The following food groups are based on a 2000 calorie diet. See your Registered Dietitian for individual calorie needs. Grains and Grain Products (6 to 8 servings daily)  Eat More Often: Whole-wheat bread, brown rice, whole-grain or wheat pasta, quinoa, popcorn without added fat or salt (air popped).  Eat Less Often: White bread, white pasta, white rice, cornbread. Vegetables (4 to 5 servings daily)  Eat More Often: Fresh, frozen, and canned vegetables. Vegetables may be raw, steamed, roasted, or grilled with a minimal amount of fat.  Eat Less Often/Avoid: Creamed or fried vegetables. Vegetables in a cheese sauce. Fruit (4 to 5 servings daily)  Eat More Often: All fresh, canned (in natural juice), or frozen fruits. Dried  fruits without added sugar. One hundred percent fruit juice ( cup [237 mL] daily).  Eat Less Often: Dried fruits with added sugar. Canned fruit in light or heavy syrup. [employer], Fish, and Poultry (2 servings or less daily. One serving is 3 to 4 oz [85-114 g]).  Eat More Often: Ninety percent or leaner ground beef, tenderloin, sirloin. Round cuts of beef, chicken breast, [place] breast. All fish. Grill, bake, or broil your meat. Nothing should be fried.  Eat Less Often/Avoid: Fatty cuts of meat, [place], or chicken leg, thigh, or wing. Fried cuts of meat or fish. Dairy (2 to 3 servings)  Eat More Often: Low-fat or fat-free milk, low-fat plain or light yogurt, reduced-fat or part-skim cheese.  Eat Less Often/Avoid: Milk (whole, 2%).Whole milk yogurt. Full-fat cheeses. Nuts, Seeds, and Legumes (4 to 5 servings per week)  Eat More Often: All without added salt.  Eat Less Often/Avoid: Salted nuts and seeds, canned beans with added salt. Fats and Sweets (limited)  Eat More Often: Vegetable oils, tub margarines without trans fats, sugar-free gelatin. Mayonnaise and salad dressings.  Eat Less Often/Avoid: Coconut oils, palm oils, butter, stick margarine, cream, half and half, cookies, candy, pie. FOR MORE INFORMATION The Dash Diet Eating Plan: www.dashdiet.org Document Released: 02/12/2011 Document Revised: 05/18/2011 Document Reviewed: 02/12/2011 [facility] Patient Information 2014 [place], [place].  Hypertension As your heart beats, it forces blood through your arteries. This force is your blood pressure. If the pressure is too high, it is called hypertension (HTN) or high blood pressure. HTN is dangerous because you may have it and not know it. High blood pressure may mean that your heart  has to work harder to pump blood. Your arteries may be narrow or stiff. The extra work puts you at risk for heart disease, stroke, and other problems.  Blood pressure consists of two numbers, a higher  number over a lower, 110/72, for example. It is stated as "110 over 72." The ideal is below 120 for the top number (systolic) and under 80 for the bottom (diastolic). Write down your blood pressure today. You should pay close attention to your blood pressure if you have certain conditions such as:  Heart failure.  Prior heart attack.  Diabetes  Chronic kidney disease.  Prior stroke.  Multiple risk factors for heart disease. To see if you have HTN, your blood pressure should be measured while you are seated with your arm held at the level of the heart. It should be measured at least twice. A one-time elevated blood pressure reading (especially in the Emergency Department) does not mean that you need treatment. There may be conditions in which the blood pressure is different between your right and left arms. It is important to see your caregiver soon for a recheck. Most people have essential hypertension which means that there is not a specific cause. This type of high blood pressure may be lowered by changing lifestyle factors such as:  Stress.  Smoking.  Lack of exercise.  Excessive weight.  Drug/tobacco/alcohol use.  Eating less salt. Most people do not have symptoms from high blood pressure until it has caused damage to the body. Effective treatment can often prevent, delay or reduce that damage. TREATMENT  When a cause has been identified, treatment for high blood pressure is directed at the cause. There are a large number of medications to treat HTN. These fall into several categories, and your caregiver will help you select the medicines that are best for you. Medications may have side effects. You should review side effects with your caregiver. If your blood pressure stays high after you have made lifestyle changes or started on medicines,   Your medication(s) may need to be changed.  Other problems may need to be addressed.  Be certain you understand your prescriptions,  and know how and when to take your medicine.  Be sure to follow up with your caregiver within the time frame advised (usually within two weeks) to have your blood pressure rechecked and to review your medications.  If you are taking more than one medicine to lower your blood pressure, make sure you know how and at what times they should be taken. Taking two medicines at the same time can result in blood pressure that is too low. SEEK IMMEDIATE MEDICAL CARE IF:  You develop a severe headache, blurred or changing vision, or confusion.  You have unusual weakness or numbness, or a faint feeling.  You have severe chest or abdominal pain, vomiting, or breathing problems. MAKE SURE YOU:   Understand these instructions.  Will watch your condition.  Will get help right away if you are not doing well or get worse. Document Released: 02/23/2005 Document Revised: 05/18/2011 Document Reviewed: 10/14/2007 [facility] Patient Information 2014 [place], [place].  Gastroesophageal Reflux Disease, Adult Gastroesophageal reflux disease (GERD) happens when acid from your stomach flows up into the esophagus. When acid comes in contact with the esophagus, the acid causes soreness (inflammation) in the esophagus. Over time, GERD may create small holes (ulcers) in the lining of the esophagus. CAUSES   Increased body weight. This puts pressure on the stomach, making acid rise from the  stomach into the esophagus.  Smoking. This increases acid production in the stomach.  Drinking alcohol. This causes decreased pressure in the lower esophageal sphincter (valve or ring of muscle between the esophagus and stomach), allowing acid from the stomach into the esophagus.  Late evening meals and a full stomach. This increases pressure and acid production in the stomach.  A malformed lower esophageal sphincter. Sometimes, no cause is found. SYMPTOMS   Burning pain in the lower part of the mid-chest behind the breastbone  and in the mid-stomach area. This may occur twice a week or more often.  Trouble swallowing.  Sore throat.  Dry cough.  Asthma-like symptoms including chest tightness, shortness of breath, or wheezing. DIAGNOSIS  Your caregiver may be able to diagnose GERD based on your symptoms. In some cases, X-rays and other tests may be done to check for complications or to check the condition of your stomach and esophagus. TREATMENT  Your caregiver may recommend over-the-counter or prescription medicines to help decrease acid production. Ask your caregiver before starting or adding any new medicines.  HOME CARE INSTRUCTIONS   Change the factors that you can control. Ask your caregiver for guidance concerning weight loss, quitting smoking, and alcohol consumption.  Avoid foods and drinks that make your symptoms worse, such as:  Caffeine or alcoholic drinks.  Chocolate.  Peppermint or mint flavorings.  Garlic and onions.  Spicy foods.  Citrus fruits, such as oranges, lemons, or limes.  Tomato-based foods such as sauce, chili, salsa, and pizza.  Fried and fatty foods.  Avoid lying down for the 3 hours prior to your bedtime or prior to taking a nap.  Eat small, frequent meals instead of large meals.  Wear loose-fitting clothing. Do not wear anything tight around your waist that causes pressure on your stomach.  Raise the head of your bed 6 to 8 inches with wood blocks to help you sleep. Extra pillows will not help.  Only take over-the-counter or prescription medicines for pain, discomfort, or fever as directed by your caregiver.  Do not take aspirin, ibuprofen, or other nonsteroidal anti-inflammatory drugs (NSAIDs). SEEK IMMEDIATE MEDICAL CARE IF:   You have pain in your arms, neck, jaw, teeth, or back.  Your pain increases or changes in intensity or duration.  You develop nausea, vomiting, or sweating (diaphoresis).  You develop shortness of breath, or you faint.  Your vomit  is green, yellow, black, or looks like coffee grounds or blood.  Your stool is red, bloody, or black. These symptoms could be signs of other problems, such as heart disease, gastric bleeding, or esophageal bleeding. MAKE SURE YOU:   Understand these instructions.  Will watch your condition.  Will get help right away if you are not doing well or get worse. Document Released: 12/03/2004 Document Revised: 05/18/2011 Document Reviewed: 09/12/2010 [facility] Patient Information 2014 [place], [place].   Treatment Goals:  Goals (1 Years of Data) as of 10/03/12         As of Today 09/01/12 07/25/12 04/28/12 04/09/12     Blood Pressure    . Blood Pressure < 140/90  114/74 109/66 124/72 123/63 109/63     Result Component    . HEMOGLOBIN A1C < 7.0    6.0 6.1       Progress Toward Treatment Goals:  Treatment Goal 10/03/2012  Hemoglobin A1C at goal  Blood pressure at goal  Prevent falls -    Self Care Goals & Plans:  Self Care Goal 10/03/2012  Manage  my medications take my medicines as prescribed; bring my medications to every visit; refill my medications on time  Monitor my health keep track of my blood pressure; keep track of my weight  Eat healthy foods drink diet soda or water instead of juice or soda; eat more vegetables; eat foods that are low in salt; eat baked foods instead of fried foods; eat fruit for snacks and desserts; eat smaller portions  Be physically active -  Prevent falls -  Meeting treatment goals maintain the current self-care plan    Home Blood Glucose Monitoring 07/25/2012  Check my blood sugar no home glucose monitoring  When to check my blood sugar N/A     Care Management & Community Referrals:  Referral 10/03/2012  Referrals made for care management support none needed  Referrals made to community resources none       Dysphagia Swallowing problems (dysphagia) occur when solids and liquids seem to stick in your throat on the way down to your stomach, or  the food takes longer to get to the stomach. Other symptoms (problems) include regurgitating (burping) up food, noises coming from the throat, chest discomfort with swallowing, and a feeling of fullness in the throat when swallowing. When blockage in the throat is complete it may be associated with drooling. CAUSES There are many causes of swallowing difficulties and the following is generalized information regarding a number of reasons for this problem. Problems with swallowing may occur because of problems with the muscles. The food cannot be propelled in the usual manner into the stomach. There may be ulcers, scar tissue, or inflammation (soreness) in the esophagus (the food tube from the mouth to the stomach) which blocks food from passing normally into the stomach. Causes of inflammation include acid reflux from the stomach into the esophagus. Inflammation can also be caused by the herpes simplex virus, Candida (yeast), radiation (as with treatment of cancer), or inflammation from medicines not taken with adequate fluids to wash them down into the stomach. There may be nerve problems so signals cannot be sent adequately telling the muscles of the esophagus to contract and move the food along. Achalasia is a rare disorder of the esophagus in which muscular contractions of the esophagus are uncoordinated. Globus hystericus is a relatively common problem in young females in which there is a sense of an obstruction or difficulty in swallowing, but in which no abnormalities can be found. This problem usually improves over time with reassurance and testing to rule out other causes. DIAGNOSIS A number of tests will help your caregiver know what is the cause of your swallowing problems. These tests may include a barium swallow in which X-rays are taken while you are drinking a liquid that outlines the lining of the esophagus on X-ray. If the stomach and small bowel are also studied in this manner it is called an  upper gastrointestinal exam (UGI). Endoscopy may be done in which your caregiver examines your throat, esophagus, stomach, and small bowel with a small, flexible scope. Motility studies which measure the effectiveness and coordination of the muscular contractions of the esophagus may also be done. TREATMENT The treatment of swallowing problems are many, varying from medicines to surgical treatment. The treatment varies with the type of problem found. Your caregiver will discuss your results and treatment with you. If swallowing problems are severe the long-term problems which may occur include: malnutrition, pneumonia (from food going into the breathing tubes called trachea and bronchi), and an increase in tumors (  lumps) of the esophagus. SEEK IMMEDIATE MEDICAL CARE IF:  Food or another object becomes lodged in your throat or esophagus and will not move. Document Released: 02/21/2000 Document Revised: 08/25/2011 Document Reviewed: 10/12/2007 [facility] Patient Information 2014 [place], [place].

## 2012-10-03 NOTE — Progress Notes (Signed)
  Subjective:    Patient ID: [name], female    DOB: 09-Sep-1950, 62 y.o.   MRN: [id number]  HPI Comments: 62 y.o significant PMH (Atrial fibrillation last INR 2.1 on 7/24), HTN (BP 114/74), DM associated with neurpathy (controlled HA1C <7 and cbg 131), chronic pain, depression, fibromyalgia, GERD, HLD (ldl 55 09/2011), hypothyroidism, arthritis, TIA, s/p complicated ventral hernia repair.  She presents for follow up and states she has been choking more on food when eating.  She had choked on a piece of shrimp and other foods.  She has chronic back pain which she states has been intermittently flaring.  She states her GERD sx's are not controlled.  Her cough is improved since d/c ACE inhibitor.   SH: she will be moving into her new house in 10/2012 hopefully  ROS per HPI     Review of Systems  Respiratory: Negative for cough and shortness of breath.   Cardiovascular: Negative for chest pain and leg swelling.  Gastrointestinal: Negative for abdominal pain.       Objective:   Physical Exam  Nursing note and vitals reviewed. Constitutional: She is oriented to person, place, and time. Vital signs are normal. She appears well-developed and well-nourished. She is cooperative. No distress.  HENT:  Head: Normocephalic and atraumatic.  Mouth/Throat: Oropharynx is clear and moist and mucous membranes are normal. No oropharyngeal exudate.  Eyes: Conjunctivae are normal. Pupils are equal, round, and reactive to light. Right eye exhibits no discharge. Left eye exhibits no discharge. No scleral icterus.  Cardiovascular: Normal rate and normal heart sounds.  An irregular rhythm present.  No murmur heard. No lower extremity edema   Pulmonary/Chest: Effort normal and breath sounds normal. No respiratory distress. She has no wheezes.  Abdominal: Soft. Bowel sounds are normal. There is no tenderness.  Obese   Musculoskeletal:  No ttp down spine today  Neurological: She is alert and oriented to  person, place, and time. She has normal strength. Gait normal.  Neurologically intact   Skin: Skin is warm and dry. No rash noted. She is not diaphoretic.  bandaids on b/l legs due to cutting legs after shaving  Psychiatric: She has a normal mood and affect. Her speech is normal and behavior is normal. Judgment and thought content normal. Cognition and memory are normal.          Assessment & Plan:  F/u 3 months

## 2012-10-03 NOTE — Assessment & Plan Note (Addendum)
Patient needs to sign a pain contract at some point check a UDS though I do not suspect will be + Called her pharmacy.  She gets Vicodin 1 tablet q 6 hours prn #120 and is due for a Rx refill.  Called this in for Rx refill.

## 2012-10-03 NOTE — Assessment & Plan Note (Signed)
Symptoms not controlled on prilosec 20 mg qd  Will increased to 40 mg qd  Also told patient to discuss with GI at appt

## 2012-10-03 NOTE — Assessment & Plan Note (Addendum)
CHADS 2 at least 4.  On Coumadin indefinitely INR 2.1 on 7/24  Coumadin dosed and patient followed by cardiology.

## 2012-10-03 NOTE — Assessment & Plan Note (Signed)
Seems pharyngoesophageal  Will refer to GI for further follow up and patient has been seen in the past for dysphagia with rec. Of prn dilations.

## 2012-10-03 NOTE — Assessment & Plan Note (Signed)
Lab Results  Component Value Date   HGBA1C 6.0 07/25/2012   HGBA1C 6.1 04/28/2012   HGBA1C 6.0 01/18/2012     Assessment: Diabetes control: good control (HgbA1C at goal) Progress toward A1C goal:  at goal Comments: N/A  Plan: Medications:  continue current medications Home glucose monitoring: none Instruction/counseling given: no instruction/counseling  Educational resources provided: brochure;handout Self management tools provided: other (see comments) Other plans: none

## 2012-10-04 ENCOUNTER — Encounter: Payer: Self-pay | Admitting: *Deleted

## 2012-10-04 NOTE — Progress Notes (Signed)
Case discussed with Dr. McLean (at time of visit, soon after the resident saw the patient).  We reviewed the resident's history and exam and pertinent patient test results.  I agree with the assessment, diagnosis, and plan of care documented in the resident's note. 

## 2012-10-06 ENCOUNTER — Encounter: Payer: Self-pay | Admitting: Gastroenterology

## 2012-10-06 ENCOUNTER — Ambulatory Visit (INDEPENDENT_AMBULATORY_CARE_PROVIDER_SITE_OTHER): Payer: Medicare Other | Admitting: Gastroenterology

## 2012-10-06 VITALS — BP 110/82 | HR 72 | Ht 62.5 in | Wt 201.0 lb

## 2012-10-06 DIAGNOSIS — K219 Gastro-esophageal reflux disease without esophagitis: Secondary | ICD-10-CM

## 2012-10-06 DIAGNOSIS — E119 Type 2 diabetes mellitus without complications: Secondary | ICD-10-CM | POA: Diagnosis not present

## 2012-10-06 DIAGNOSIS — Z9889 Other specified postprocedural states: Secondary | ICD-10-CM

## 2012-10-06 DIAGNOSIS — Z09 Encounter for follow-up examination after completed treatment for conditions other than malignant neoplasm: Secondary | ICD-10-CM

## 2012-10-06 DIAGNOSIS — R1310 Dysphagia, unspecified: Secondary | ICD-10-CM | POA: Diagnosis not present

## 2012-10-06 DIAGNOSIS — G894 Chronic pain syndrome: Secondary | ICD-10-CM

## 2012-10-06 NOTE — Progress Notes (Signed)
This is a very complex 62 year old Caucasian female with chronic atrial fibrillation on Coumadin therapy.  She has chronic pain syndrome and is on regular hydrocodone.  One year ago she had surgical repair of several ventral hernias, and apparently at that time had evidence of partial small bowel obstruction.  This was performed by Dr. Claud Kelp.  She is 15-20 years status post open fundoplication surgery.  She currently complains of progressive solid food dysphagia, but no reflux symptoms, but she is on daily Prilosec.  Endoscopy in 2011 was unremarkable, but empiric dilation did improve the patient's dysphagia.  HER physicians have  suspected a GI motility disorder, chronic dry mouth, and perhaps Sjogren's syndrome.  Other diagnoses include diabetes with peripheral neuropathy, depression, fibromyalgia, hypothyroidism, and previous TIAs.  She is not on insulin but takes metformin 500 mg twice a day.  Current Medications, Allergies, Past Medical History, Past Surgical History, Family History and Social History were reviewed in [employer] record.  ROS: All systems were reviewed and are negative unless otherwise stated in the HPI.      Allergies  Allergen Reactions  . Gemfibrozil Swelling    REACTION: Angioedema  . Latex Other (See Comments)    Blisters where touched or applied  . Penicillins Hives    Will spread in patches all over the body.  . Adhesive (Tape) Other (See Comments)    Will blister skin where applied - do not use BAND-AIDS.  Marland Kitchen Codeine Hives    Will spread in patches all over the body.  . Ace Inhibitors     Cough   . Digoxin And Related     Makes BP drop   Outpatient Prescriptions Prior to Visit  Medication Sig Dispense Refill  . albuterol (PROVENTIL HFA;VENTOLIN HFA) 108 (90 BASE) MCG/ACT inhaler Inhale 2 puffs into the lungs every 6 (six) hours as needed. For shortness of breath  1 Inhaler  11  . ALPRAZolam (XANAX) 0.5 MG tablet Take 0.5 mg by  mouth at bedtime as needed. For anxiety/sleep.      Marland Kitchen amitriptyline (ELAVIL) 100 MG tablet Take 50 mg by mouth at bedtime.      Marland Kitchen atenolol (TENORMIN) 25 MG tablet Take 1.5 tablets (37.5 mg total) by mouth 2 (two) times daily.  135 tablet  4  . calcium carbonate (OS-CAL) 600 MG TABS Take 600 mg by mouth daily.      . cyclobenzaprine (FLEXERIL) 5 MG tablet Take 5 mg by mouth 2 (two) times daily as needed. For fibromyalgia      . diltiazem (DILACOR XR) 180 MG 24 hr capsule Take 1 capsule (180 mg total) by mouth daily.  90 capsule  4  . FLUoxetine (PROZAC) 20 MG tablet Take 1 tablet (20 mg total) by mouth daily.  90 tablet  4  . furosemide (LASIX) 20 MG tablet Take 1 tablet (20 mg total) by mouth daily.  90 tablet  4  . gabapentin (NEURONTIN) 100 MG capsule Take 1 capsule (100 mg total) by mouth 3 (three) times daily.  270 capsule  4  . HYDROcodone-acetaminophen (VICODIN) 5-325 mg TABS Take 1 tablet by mouth every 8 (eight) hours as needed.      Marland Kitchen levothyroxine (SYNTHROID, LEVOTHROID) 75 MCG tablet Take 1 tablet (75 mcg total) by mouth every morning.  90 tablet  4  . losartan (COZAAR) 25 MG tablet Take 0.5 tablets (12.5 mg total) by mouth daily.  90 tablet  1  . metFORMIN (GLUCOPHAGE) 500  MG tablet Take 500 mg by mouth 2 (two) times daily with a meal.      . omeprazole (PRILOSEC) 20 MG capsule Take 1 capsule (20 mg total) by mouth daily.  90 capsule  4  . potassium chloride (K-DUR) 10 MEQ tablet Take 1 tablet (10 mEq total) by mouth daily.  90 tablet  4  . pravastatin (PRAVACHOL) 40 MG tablet Take 1 tablet (40 mg total) by mouth daily.  90 tablet  4  . warfarin (COUMADIN) 2.5 MG tablet Take 1-2 tablets (2.5-5 mg total) by mouth daily. Take 5 MG on Monday, Wednesday, and Friday. Take 2.5 MG on Tuesday, Thursday, Saturday, and Sunday.  60 tablet  4   No facility-administered medications prior to visit.   Past Medical History  Diagnosis Date  . Depression   . Obesity   . Skin neoplasm   . Bunion    . Carotid stenosis   . Fibromyalgia   . Internal hemorrhoid   . GERD (gastroesophageal reflux disease)   . Chronic gastritis   . Transaminase or LDH elevation   . Mild cognitive impairment   . Hyperlipidemia   . Fatty liver   . Lumbar back pain   . Diabetic peripheral neuropathy   . Dysphagia     no documented strictures but responded positively to dilation in past.   . Hypertension   . Cholecystitis     s/p cholecystectomy  . Sarcoidosis   . Fibromyalgia   . CHF (congestive heart failure)   . Skin cancer 1990's    "front of my right leg"  . Atrial fibrillation   . OSA (obstructive sleep apnea) 2003    "can't sleep w/that darm machine"  . Exertional dyspnea   . Hypothyroidism   . Type II diabetes mellitus   . TIA (transient ischemic attack)     07/29/11 pt denies this history  . H/O hiatal hernia   . Epileptic seizure, tonic     .No meds since age of 51.  . Osteoarthritis   . Stroke     tia  . Hypothyroid   . Diverticulosis   . Digoxin toxicity     08/2011  . Bradycardia, drug induced     08/2011 (dig)  . Drug-induced hypotension     08/2011 (Dig)   Past Surgical History  Procedure Laterality Date  . Hiatal hernia repair  1990    "had to have scar tissue removed 6 months after repair"  . Esophageal dilation    . Cataract extraction w/phaco  10/27/2010    Procedure: CATARACT EXTRACTION PHACO AND INTRAOCULAR LENS PLACEMENT (IOC);  Surgeon: Susa Simmonds;  Location: [facility];  Service: Ophthalmology;  Laterality: Left;  CDE- 1.78  . Cataract extraction w/phaco  07/13/2011    Procedure: CATARACT EXTRACTION PHACO AND INTRAOCULAR LENS PLACEMENT (IOC);  Surgeon: Susa Simmonds, MD;  Location: [facility];  Service: Ophthalmology;  Laterality: Right;  CDE:  1.65  . Liposuction  1992  . Inverted nipples  1992  . Breast surgery  ~ 2010    excision milk duct; right breast  . Foot surgery  ~ 2011    ~straightened toe left foot & scraped bone below big toe"  . Foot surgery  ~  2011    "scraped bone of big toe; shortened middle toe; right foot"  . Cholecystectomy  1980's  . Dilation and curettage of uterus  1970; 1976  . Abdominal hysterectomy  1980's    partial  .  Tubal ligation  1976  . Bilateral oophorectomy  1980's    "after partial hysterectomy"  . Skin cancer excision  1990's    "front side of right shin"  . Ventral hernia repair  07/29/2011    Procedure: LAPAROSCOPIC VENTRAL HERNIA;  Surgeon: Ernestene Mention, MD;  Location: [facility] OR;  Service: General;  Laterality: N/A;  multiple incarcerated hernias with mesh  . Other surgical history      mutiple ab surgeries for abdominal hernia  . Other surgical history      multiple dilation of esophagus  . Other surgical history      right lens implant 07/2011, left lens implant 10/2010  . Other surgical history      right breast bx=benign   History   Social History  . Marital Status: Divorced    Spouse Name: N/A    Number of Children: 2  . Years of Education: 12th    Occupational History  . cosmotologist    Social History Main Topics  . Smoking status: Never Smoker   . Smokeless tobacco: Never Used  . Alcohol Use: No  . Drug Use: No  . Sexually Active: Not Currently   Other Topics Concern  . None   Social History Narrative   Divorced, on disability, has 2 kids (son in [place], daughter in [place]), owns a house in [place], [place] and has a female roommate    No caffeine    Family History  Problem Relation Age of Onset  . Crohn's disease Mother   . Colitis Mother     Crohns  . Anesthesia problems Mother   . Cancer Father     skin  . Diabetes Father   . Heart disease Father   . Melanoma Father   . Coronary artery disease Father   . Diabetes Sister   . Depression Maternal Uncle   . Dementia Maternal Uncle   . Hypotension Neg Hx   . Malignant hyperthermia Neg Hx   . Pseudochol deficiency Neg Hx   . Colon cancer Paternal Uncle     ? if stomach or colon          Physical Exam: Blood pressure  114/74, pulse 93 and irregular, and weight 199 with a BMI of 35.81.  I cannot appreciate stigmata of chronic liver disease.  Examination oropharynx is unremarkable except for very dry mouth.  There is no thyromegaly or lymphadenopathy.  Chest is clear, and cardiac exam shows no murmurs gallops or rubs.  She appear to be in a slightly irregular rhythm.  She has protuberant obese abdomen with a large midline abdominal scar.  There appears to be mild hepatomegaly, but I cannot appreciate any other abdominal masses, tenderness, or ascites.  Bowel sounds are nonobstructive.  Mental status is normal.  Peripheral extremities are unremarkable.    Assessment and Plan: Progressive solid food dysphagia in a complex 62 year old patient with previous fundoplication many years ago.  She is continued on PPI therapy for many years despite her surgery.  We'll proceed with repeat endoscopy for evaluation, but I suspect she will also need barium swallow and esophageal high-resolution manometry.  Reviewing Dr. Jacinto Halim note from her hospitalization at the time of her exploratory abdominal surgery suggested a possible underlying GI motility disorder, but really can get no symptoms of gastroparesis or severe constipation today.  Level 4 dysphagia diet to be  followed pending endoscopy and dilation.  She is to continue her other multiple medications as listed and reviewed.  We will also consider referral to ENT per her very dry mouth syndrome and further evaluation for possible Sjogren's syndrome.  Some of her dry mouth is probably related to Elavil under milligrams a day which he takes for chronic depression.  Colonoscopy in 2010 was negative except for left colon diverticulosis.

## 2012-10-06 NOTE — Patient Instructions (Addendum)
You have been scheduled for an endoscopy with propofol. Please follow written instructions given to you at your visit today. If you use inhalers (even only as needed), please bring them with you on the day of your procedure. Your physician has requested that you go to www.startemmi.com and enter the access code given to you at your visit today. This web site gives a general overview about your procedure. However, you should still follow specific instructions given to you by our office regarding your preparation for the procedure.  Please stay on your Coumadin per Dr. Jarold Motto  Follow Dysphagia diet given today level 4 ___________________________________________________________________                                               We are excited to introduce MyChart, a new best-in-class service that provides you online access to important information in your electronic medical record. We want to make it easier for you to view your health information - all in one secure location - when and where you need it. We expect MyChart will enhance the quality of care and service we provide.  When you register for MyChart, you can:    View your test results.    Request appointments and receive appointment reminders via email.    Request medication renewals.    View your medical history, allergies, medications and immunizations.    Communicate with your physician's office through a password-protected site.    Conveniently print information such as your medication lists.  To find out if MyChart is right for you, please talk to a member of our clinical staff today. We will gladly answer your questions about this free health and wellness tool.  If you are age 60 or older and want a member of your family to have access to your record, you must provide written consent by completing a proxy form available at our office. Please speak to our clinical staff about guidelines regarding accounts for patients  younger than age 70.  As you activate your MyChart account and need any technical assistance, please call the MyChart technical support line at [phone] 2727268725) or email your question to [email or web address]. If you email your question(s), please include your name, a return phone number and the best time to reach you.  If you have non-urgent health-related questions, you can send a message to our office through MyChart at [email or web address]. If you have a medical emergency, call 911.  Thank you for using MyChart as your new health and wellness resource!   MyChart licensed from [employer],  [id number]. Patents Pending.

## 2012-10-10 ENCOUNTER — Ambulatory Visit (AMBULATORY_SURGERY_CENTER): Payer: Medicare Other | Admitting: Gastroenterology

## 2012-10-10 ENCOUNTER — Encounter: Payer: Self-pay | Admitting: Gastroenterology

## 2012-10-10 VITALS — BP 143/77 | HR 114 | Temp 98.6°F | Resp 19 | Ht 62.0 in | Wt 201.0 lb

## 2012-10-10 DIAGNOSIS — R1310 Dysphagia, unspecified: Secondary | ICD-10-CM

## 2012-10-10 DIAGNOSIS — K219 Gastro-esophageal reflux disease without esophagitis: Secondary | ICD-10-CM | POA: Diagnosis not present

## 2012-10-10 DIAGNOSIS — G4733 Obstructive sleep apnea (adult) (pediatric): Secondary | ICD-10-CM | POA: Diagnosis not present

## 2012-10-10 DIAGNOSIS — D869 Sarcoidosis, unspecified: Secondary | ICD-10-CM | POA: Diagnosis not present

## 2012-10-10 DIAGNOSIS — I4891 Unspecified atrial fibrillation: Secondary | ICD-10-CM | POA: Diagnosis not present

## 2012-10-10 DIAGNOSIS — E119 Type 2 diabetes mellitus without complications: Secondary | ICD-10-CM | POA: Diagnosis not present

## 2012-10-10 LAB — GLUCOSE, CAPILLARY
Glucose-Capillary: 107 mg/dL — ABNORMAL HIGH (ref 70–99)
Glucose-Capillary: 104 mg/dL — ABNORMAL HIGH (ref 70–99)

## 2012-10-10 MED ORDER — SODIUM CHLORIDE 0.9 % IV SOLN: 500.0000 mL | INTRAVENOUS | Status: DC

## 2012-10-10 NOTE — Progress Notes (Signed)
Patient did not experience any of the following events: a burn prior to discharge; a fall within the facility; wrong site/side/patient/procedure/implant event; or a hospital transfer or hospital admission upon discharge from the facility. (G8907) Patient did not have preoperative order for IV antibiotic SSI prophylaxis. (G8918)  

## 2012-10-10 NOTE — Progress Notes (Signed)
FRONT TOOTH WITH A CHIP, PATIENT STATING THE CAP IS BARELY ON.

## 2012-10-10 NOTE — Patient Instructions (Addendum)
Discharge instructions given with verbal understanding. Resume previous medications. YOU HAD AN ENDOSCOPIC PROCEDURE TODAY AT THE [facility]: Refer to the procedure report that was given to you for any specific questions about what was found during the examination.  If the procedure report does not answer your questions, please call your gastroenterologist to clarify.  If you requested that your care partner not be given the details of your procedure findings, then the procedure report has been included in a sealed envelope for you to review at your convenience later.  YOU SHOULD EXPECT: Some feelings of bloating in the abdomen. Passage of more gas than usual.  Walking can help get rid of the air that was put into your GI tract during the procedure and reduce the bloating. If you had a lower endoscopy (such as a colonoscopy or flexible sigmoidoscopy) you may notice spotting of blood in your stool or on the toilet paper. If you underwent a bowel prep for your procedure, then you may not have a normal bowel movement for a few days.  DIET: Your first meal following the procedure should be a light meal and then it is ok to progress to your normal diet.  A half-sandwich or bowl of soup is an example of a good first meal.  Heavy or fried foods are harder to digest and may make you feel nauseous or bloated.  Likewise meals heavy in dairy and vegetables can cause extra gas to form and this can also increase the bloating.  Drink plenty of fluids but you should avoid alcoholic beverages for 24 hours.  ACTIVITY: Your care partner should take you home directly after the procedure.  You should plan to take it easy, moving slowly for the rest of the day.  You can resume normal activity the day after the procedure however you should NOT DRIVE or use heavy machinery for 24 hours (because of the sedation medicines used during the test).    SYMPTOMS TO REPORT IMMEDIATELY: A gastroenterologist can be reached  at any hour.  During normal business hours, 8:30 AM to 5:00 PM Monday through Friday, call [phone].  After hours and on weekends, please call the GI answering service at [phone] who will take a message and have the physician on call contact you.   Following upper endoscopy (EGD)  Vomiting of blood or coffee ground material  New chest pain or pain under the shoulder blades  Painful or persistently difficult swallowing  New shortness of breath  Fever of 100F or higher  Black, tarry-looking stools  FOLLOW UP: If any biopsies were taken you will be contacted by phone or by letter within the next 1-3 weeks.  Call your gastroenterologist if you have not heard about the biopsies in 3 weeks.  Our staff will call the home number listed on your records the next business day following your procedure to check on you and address any questions or concerns that you may have at that time regarding the information given to you following your procedure. This is a courtesy call and so if there is no answer at the home number and we have not heard from you through the emergency physician on call, we will assume that you have returned to your regular daily activities without incident.  SIGNATURES/CONFIDENTIALITY: You and/or your care partner have signed paperwork which will be entered into your electronic medical record.  These signatures attest to the fact that that the information above on your After Visit Summary   has been reviewed and is understood.  Full responsibility of the confidentiality of this discharge information lies with you and/or your care-partner. 

## 2012-10-10 NOTE — Progress Notes (Signed)
Called to room to assist during endoscopic procedure.  Patient ID and intended procedure confirmed with present staff. Received instructions for my participation in the procedure from the performing physician.  

## 2012-10-10 NOTE — Op Note (Signed)
[facility] [address] [place], [address]   ENDOSCOPY PROCEDURE REPORT  PATIENT: [name], [name]  MR#: [id number] BIRTHDATE: [birth year] , 62  yrs. old GENDER: Female ENDOSCOPIST:Brant Peets Hale Bogus, MD, [facility] REFERRED BY: PROCEDURE DATE:  10/10/2012 PROCEDURE:   EGD, diagnostic and Maloney dilation of esophagus ASA CLASS:    Class III INDICATIONS: Dysphagia. MEDICATION: propofol (Diprivan) 250mg  IV TOPICAL ANESTHETIC:   Cetacaine Spray  DESCRIPTION OF PROCEDURE:   After the risks and benefits of the procedure were explained, informed consent was obtained.  The endoscope was introduced through the mouth  and advanced to the second portion of the duodenum .  The instrument was slowly withdrawn as the mucosa was fully examined.    The upper, middle and distal third of the esophagus were carefully inspected and no abnormalities were noted.  The z-line was well seen at the GEJ.  The endoscope was pushed into the fundus which was normal including a retroflexed view.  The antrum, gastric body, first and second part of the duodenum were unremarkable.swallow the endoscope, there was some heme in the throat area from intubation. She was easily and dilated with a #54 [place] dilator.  I see no definite mucosal polypoid lesions in the throat or esophagus.  There was evidence of a probable previous fundoplication.  There certainly is no large hiatal hernia or stricture at the GE junction.    Retroflexed views revealed no abnormalities.    The scope was then withdrawn from the patient and the procedure completed.  COMPLICATIONS: There were no complications.   ENDOSCOPIC IMPRESSION: Normal EGD...previous fundoplication without evidence of GERD or distal esophageal stricturing.  She may have had some narrowing in the proximal esophagus such as an esophageal web..  In any case, she was dilated a day without difficulty.  Patient is on Coumadin therapy which was  not stopped.  She has multiple, multiple medical problems and is on multiple medications.  RECOMMENDATIONS: 1.  Continue PPI 2.  Continue current meds 3. consider esophageal manometry if problems persist and barium swallow.    _______________________________ eSignedMardella Layman, MD, [facility] 10/10/2012 3:36 PM   standard discharge

## 2012-10-11 ENCOUNTER — Telehealth: Payer: Self-pay

## 2012-10-11 NOTE — Telephone Encounter (Signed)
  Follow up Call-  Call back number 10/10/2012  Post procedure Call Back phone  # [phone]  Permission to leave phone message Yes     Patient questions:  Do you have a fever, pain , or abdominal swelling? yes Pain Score  0 *  Have you tolerated food without any problems? yes  Have you been able to return to your normal activities? yes  Do you have any questions about your discharge instructions: Diet   no Medications  no Follow up visit  no  Do you have questions or concerns about your Care? no  Actions: * If pain score is 4 or above: No action needed, pain <4.

## 2012-10-27 ENCOUNTER — Ambulatory Visit (INDEPENDENT_AMBULATORY_CARE_PROVIDER_SITE_OTHER): Payer: Medicare Other | Admitting: *Deleted

## 2012-10-27 DIAGNOSIS — Z7901 Long term (current) use of anticoagulants: Secondary | ICD-10-CM

## 2012-10-27 DIAGNOSIS — G459 Transient cerebral ischemic attack, unspecified: Secondary | ICD-10-CM

## 2012-10-27 DIAGNOSIS — I4891 Unspecified atrial fibrillation: Secondary | ICD-10-CM | POA: Diagnosis not present

## 2012-12-08 ENCOUNTER — Ambulatory Visit (INDEPENDENT_AMBULATORY_CARE_PROVIDER_SITE_OTHER): Payer: Medicare Other | Admitting: Pharmacist

## 2012-12-08 DIAGNOSIS — I4891 Unspecified atrial fibrillation: Secondary | ICD-10-CM | POA: Diagnosis not present

## 2012-12-08 DIAGNOSIS — G459 Transient cerebral ischemic attack, unspecified: Secondary | ICD-10-CM

## 2012-12-08 DIAGNOSIS — Z7901 Long term (current) use of anticoagulants: Secondary | ICD-10-CM | POA: Diagnosis not present

## 2012-12-15 ENCOUNTER — Other Ambulatory Visit: Payer: Self-pay | Admitting: Internal Medicine

## 2012-12-15 DIAGNOSIS — Z1231 Encounter for screening mammogram for malignant neoplasm of breast: Secondary | ICD-10-CM

## 2013-01-06 ENCOUNTER — Other Ambulatory Visit: Payer: Self-pay | Admitting: *Deleted

## 2013-01-06 DIAGNOSIS — F329 Major depressive disorder, single episode, unspecified: Secondary | ICD-10-CM

## 2013-01-06 DIAGNOSIS — F32A Depression, unspecified: Secondary | ICD-10-CM

## 2013-01-06 MED ORDER — AMITRIPTYLINE HCL 100 MG PO TABS: 50.0000 mg | ORAL_TABLET | Freq: Every day | ORAL | Status: DC

## 2013-01-12 ENCOUNTER — Ambulatory Visit (HOSPITAL_COMMUNITY)
Admission: RE | Admit: 2013-01-12 | Discharge: 2013-01-12 | Disposition: A | Discharge status: Home / Self Care | Payer: Medicare Other | Source: Ambulatory Visit | Attending: Internal Medicine | Admitting: Internal Medicine

## 2013-01-12 ENCOUNTER — Encounter: Payer: Self-pay | Admitting: Internal Medicine

## 2013-01-12 ENCOUNTER — Ambulatory Visit (INDEPENDENT_AMBULATORY_CARE_PROVIDER_SITE_OTHER): Payer: Medicare Other | Admitting: Internal Medicine

## 2013-01-12 VITALS — BP 122/79 | HR 87 | Temp 98.6°F | Wt 202.8 lb

## 2013-01-12 DIAGNOSIS — E079 Disorder of thyroid, unspecified: Secondary | ICD-10-CM

## 2013-01-12 DIAGNOSIS — E119 Type 2 diabetes mellitus without complications: Secondary | ICD-10-CM | POA: Diagnosis not present

## 2013-01-12 DIAGNOSIS — R109 Unspecified abdominal pain: Secondary | ICD-10-CM | POA: Diagnosis not present

## 2013-01-12 DIAGNOSIS — R079 Chest pain, unspecified: Secondary | ICD-10-CM | POA: Diagnosis not present

## 2013-01-12 DIAGNOSIS — Z0000 Encounter for general adult medical examination without abnormal findings: Secondary | ICD-10-CM

## 2013-01-12 DIAGNOSIS — R51 Headache: Secondary | ICD-10-CM | POA: Diagnosis not present

## 2013-01-12 DIAGNOSIS — E039 Hypothyroidism, unspecified: Secondary | ICD-10-CM | POA: Diagnosis not present

## 2013-01-12 DIAGNOSIS — R1012 Left upper quadrant pain: Secondary | ICD-10-CM | POA: Insufficient documentation

## 2013-01-12 DIAGNOSIS — Z23 Encounter for immunization: Secondary | ICD-10-CM

## 2013-01-12 DIAGNOSIS — I10 Essential (primary) hypertension: Secondary | ICD-10-CM

## 2013-01-12 DIAGNOSIS — M549 Dorsalgia, unspecified: Secondary | ICD-10-CM

## 2013-01-12 DIAGNOSIS — G8929 Other chronic pain: Secondary | ICD-10-CM

## 2013-01-12 LAB — LIPID PANEL
Cholesterol: 172 mg/dL (ref 0–200)
HDL: 40 mg/dL (ref >39)
LDL Cholesterol: 58 mg/dL (ref 0–99)
Total CHOL/HDL Ratio: 4.3 Ratio
Triglycerides: 372 mg/dL — ABNORMAL HIGH (ref <150)

## 2013-01-12 LAB — GLUCOSE, CAPILLARY: Glucose-Capillary: 109 mg/dL — ABNORMAL HIGH (ref 70–99)

## 2013-01-12 LAB — COMPREHENSIVE METABOLIC PANEL
ALT: 14 U/L (ref 0–35)
Alkaline Phosphatase: 116 U/L (ref 39–117)
CO2: 30 mEq/L (ref 19–32)
Calcium: 9.7 mg/dL (ref 8.4–10.5)
Chloride: 101 mEq/L (ref 96–112)
Creat: 0.82 mg/dL (ref 0.50–1.10)
Glucose, Bld: 98 mg/dL (ref 70–99)
Potassium: 4.4 mEq/L (ref 3.5–5.3)
Sodium: 140 mEq/L (ref 135–145)
Total Bilirubin: 0.5 mg/dL (ref 0.3–1.2)
Total Protein: 6.9 g/dL (ref 6.0–8.3)

## 2013-01-12 LAB — TSH: TSH: 0.73 u[IU]/mL (ref 0.350–4.500)

## 2013-01-12 MED ORDER — IBUPROFEN 800 MG PO TABS: 800.0000 mg | ORAL_TABLET | Freq: Three times a day (TID) | ORAL | Status: DC | PRN

## 2013-01-12 MED ORDER — HYDROCODONE-ACETAMINOPHEN 5-325 MG PO TABS: 1.0000 | ORAL_TABLET | Freq: Four times a day (QID) | ORAL | Status: DC | PRN

## 2013-01-12 NOTE — Progress Notes (Signed)
Subjective:    Patient ID: [name], female    DOB: 08-12-1950, 62 y.o.   MRN: [id number]  HPI Comments: 62 y.o female PMH depression, obesity, skin neoplasm (right leg), carotid stenosis, Fibromyalgia, Internal hemorrhoid, GERD (gastroesophageal reflux disease), Chronic gastritis, Transaminase or LDH elevation, Hyperlipidemia, Fatty liver, Chronic pain (back pain), chronic h/a, diverticulosis, Diabetic peripheral neuropathy, Dysphagia with h/o strictures and dilation, Hypertension (controlled 122/79), Cholecystitis s/p cholecystectomy, Sarcoidosis, CHF (congestive heart failure), Atrial fibrillation on Coumadin (INR followed by cardiology), obesity, OSA (obstructive sleep apnea) w/o cpap, Exertional dyspnea, Hypothyroidism, Type II diabetes mellitus (cbg 109, HA1C 6.1%), H/O hiatal hernia, h/o ventral hernia with complex repair, epileptic seizure (as a child), Osteoarthritis, Hypothyroid, diverticulosis, digoxin drug-induced hypotension and bradycardia, borderline osteopenia 2010.    1) She c/o worsening neck pain that radiates down her back.  Pain is worse for the last 2-3 months.  She is at times taking 1-2 Vicodin for relief tid.  Pain radiates from cervical to thoracic and is shooting sometimes 10/10 worse in the morning and better with movement.  She lies on the side that hurts which eases the pain and lying on the other side makes the pain worse.  Her mother and mothers side of the family has rheumatoid arthritis.    2) She c/o LUQ and epigastric pain yesterday stabbing in nature.  She c/o soreness in that area with breathing.  She noticed the pain as she reached from a sitting position to pick something off the floor.  Area is sore to touch.  Patient denies fall recently or other trauma.                  3) Pt c/o h/a of left side of head sharp.  Denies visual changes, dizziness or lightheadedness.                     HM: Given flu vaccine today, check lipid panel, HA1C, will need repeat  DEXA 07/2013   Back Pain This is a new problem. The current episode started more than 1 month ago. The problem occurs daily. The problem has been gradually worsening since onset. The pain is present in the thoracic spine (cervical ). The quality of the pain is described as shooting. The pain is at a severity of 10/10. Worse during: worse in am  The symptoms are aggravated by sitting (lying down on opposite site; movement ). Associated symptoms include headaches. Pertinent negatives include no abdominal pain, bladder incontinence, bowel incontinence, chest pain, dysuria, fever or tingling. Risk factors: h/o boderline osteopenia  She has tried analgesics for the symptoms. The treatment provided mild relief.      Review of Systems  Constitutional: Negative for fever and chills.  Respiratory: Positive for shortness of breath.        Intermittent sob worse with exertion  Denies orthopnea  Cardiovascular: Negative for chest pain and leg swelling.  Gastrointestinal: Negative for abdominal pain, constipation and bowel incontinence.  Genitourinary: Negative for bladder incontinence and dysuria.  Musculoskeletal: Positive for back pain and neck pain.  Neurological: Positive for headaches. Negative for dizziness, tingling and light-headedness.       Left sided h/a        Objective:   Physical Exam  Nursing note and vitals reviewed. Constitutional: She is oriented to person, place, and time. Vital signs are normal. She appears well-developed and well-nourished. She is cooperative. No distress.  HENT:  Head: Normocephalic and atraumatic.  Mouth/Throat: Oropharynx  is clear and moist and mucous membranes are normal. Abnormal dentition. No oropharyngeal exudate.  Eyes: Conjunctivae are normal. Pupils are equal, round, and reactive to light. Right eye exhibits no discharge. Left eye exhibits no discharge. No scleral icterus.  Cardiovascular: Normal rate, regular rhythm, S1 normal, S2 normal and normal  heart sounds.   No murmur heard. No lower extremity edema   Pulmonary/Chest: Effort normal and breath sounds normal. No respiratory distress. She has no wheezes.  Abdominal: Soft. Bowel sounds are normal. There is no tenderness.  Obese ab  Musculoskeletal:       Cervical back: She exhibits tenderness.       Thoracic back: She exhibits tenderness.  No decreased ROM noted in neck  Mild ttp cervical to thoracic spine   Neurological: She is alert and oriented to person, place, and time. Gait normal.  CN 2-12 grossly intact   Skin: Skin is warm, dry and intact. No rash noted. She is not diaphoretic.  Psychiatric: She has a normal mood and affect. Her speech is normal and behavior is normal. Judgment and thought content normal. Cognition and memory are normal.          Assessment & Plan:  F/u in 1 month prn otherwise 3-4 months  Will need colonoscopy records from GI

## 2013-01-12 NOTE — Patient Instructions (Addendum)
General Instructions: Please follow up in 3-4 months with Dr. Shirlee Latch  Please go get Xray left rib Try to Alternate Ibuprofen and Norco  I will check labs today and let you know  Follow up in 1 month if not better    Back Pain, Adult Low back pain is very common. About 1 in 5 people have back pain.The cause of low back pain is rarely dangerous. The pain often gets better over time.About half of people with a sudden onset of back pain feel better in just 2 weeks. About 8 in 10 people feel better by 6 weeks.  CAUSES Some common causes of back pain include:  Strain of the muscles or ligaments supporting the spine.  Wear and tear (degeneration) of the spinal discs.  Arthritis.  Direct injury to the back. DIAGNOSIS Most of the time, the direct cause of low back pain is not known.However, back pain can be treated effectively even when the exact cause of the pain is unknown.Answering your caregiver's questions about your overall health and symptoms is one of the most accurate ways to make sure the cause of your pain is not dangerous. If your caregiver needs more information, he or she may order lab work or imaging tests (X-rays or MRIs).However, even if imaging tests show changes in your back, this usually does not require surgery. HOME CARE INSTRUCTIONS For many people, back pain returns.Since low back pain is rarely dangerous, it is often a condition that people can learn to [facility] their own.   Remain active. It is stressful on the back to sit or stand in one place. Do not sit, drive, or stand in one place for more than 30 minutes at a time. Take short walks on level surfaces as soon as pain allows.Try to increase the length of time you walk each day.  Do not stay in bed.Resting more than 1 or 2 days can delay your recovery.  Do not avoid exercise or work.Your body is made to move.It is not dangerous to be active, even though your back may hurt.Your back will likely heal faster  if you return to being active before your pain is gone.  Pay attention to your body when you bend and lift. Many people have less discomfortwhen lifting if they bend their knees, keep the load close to their bodies,and avoid twisting. Often, the most comfortable positions are those that put less stress on your recovering back.  Find a comfortable position to sleep. Use a firm mattress and lie on your side with your knees slightly bent. If you lie on your back, put a pillow under your knees.  Only take over-the-counter or prescription medicines as directed by your caregiver. Over-the-counter medicines to reduce pain and inflammation are often the most helpful.Your caregiver may prescribe muscle relaxant drugs.These medicines help dull your pain so you can more quickly return to your normal activities and healthy exercise.  Put ice on the injured area.  Put ice in a plastic bag.  Place a towel between your skin and the bag.  Leave the ice on for 15-20 minutes, 03-04 times a day for the first 2 to 3 days. After that, ice and heat may be alternated to reduce pain and spasms.  Ask your caregiver about trying back exercises and gentle massage. This may be of some benefit.  Avoid feeling anxious or stressed.Stress increases muscle tension and can worsen back pain.It is important to recognize when you are anxious or stressed and learn ways to  manage it.Exercise is a great option. SEEK MEDICAL CARE IF:  You have pain that is not relieved with rest or medicine.  You have pain that does not improve in 1 week.  You have new symptoms.  You are generally not feeling well. SEEK IMMEDIATE MEDICAL CARE IF:   You have pain that radiates from your back into your legs.  You develop new bowel or bladder control problems.  You have unusual weakness or numbness in your arms or legs.  You develop nausea or vomiting.  You develop abdominal pain.  You feel faint. Document Released: 02/23/2005  Document Revised: 08/25/2011 Document Reviewed: 07/14/2010 [facility] Patient Information 2014 [place], [place].     DASH Diet The DASH diet stands for "Dietary Approaches to Stop Hypertension." It is a healthy eating plan that has been shown to reduce high blood pressure (hypertension) in as little as 14 days, while also possibly providing other significant health benefits. These other health benefits include reducing the risk of breast cancer after menopause and reducing the risk of type 2 diabetes, heart disease, colon cancer, and stroke. Health benefits also include weight loss and slowing kidney failure in patients with chronic kidney disease.  DIET GUIDELINES  Limit salt (sodium). Your diet should contain less than 1500 mg of sodium daily.  Limit refined or processed carbohydrates. Your diet should include mostly whole grains. Desserts and added sugars should be used sparingly.  Include small amounts of heart-healthy fats. These types of fats include nuts, oils, and tub margarine. Limit saturated and trans fats. These fats have been shown to be harmful in the body. CHOOSING FOODS  The following food groups are based on a 2000 calorie diet. See your Registered Dietitian for individual calorie needs. Grains and Grain Products (6 to 8 servings daily)  Eat More Often: Whole-wheat bread, brown rice, whole-grain or wheat pasta, quinoa, popcorn without added fat or salt (air popped).  Eat Less Often: White bread, white pasta, white rice, cornbread. Vegetables (4 to 5 servings daily)  Eat More Often: Fresh, frozen, and canned vegetables. Vegetables may be raw, steamed, roasted, or grilled with a minimal amount of fat.  Eat Less Often/Avoid: Creamed or fried vegetables. Vegetables in a cheese sauce. Fruit (4 to 5 servings daily)  Eat More Often: All fresh, canned (in natural juice), or frozen fruits. Dried fruits without added sugar. One hundred percent fruit juice ( cup [237 mL]  daily).  Eat Less Often: Dried fruits with added sugar. Canned fruit in light or heavy syrup. [employer], Fish, and Poultry (2 servings or less daily. One serving is 3 to 4 oz [85-114 g]).  Eat More Often: Ninety percent or leaner ground beef, tenderloin, sirloin. Round cuts of beef, chicken breast, [place] breast. All fish. Grill, bake, or broil your meat. Nothing should be fried.  Eat Less Often/Avoid: Fatty cuts of meat, [place], or chicken leg, thigh, or wing. Fried cuts of meat or fish. Dairy (2 to 3 servings)  Eat More Often: Low-fat or fat-free milk, low-fat plain or light yogurt, reduced-fat or part-skim cheese.  Eat Less Often/Avoid: Milk (whole, 2%).Whole milk yogurt. Full-fat cheeses. Nuts, Seeds, and Legumes (4 to 5 servings per week)  Eat More Often: All without added salt.  Eat Less Often/Avoid: Salted nuts and seeds, canned beans with added salt. Fats and Sweets (limited)  Eat More Often: Vegetable oils, tub margarines without trans fats, sugar-free gelatin. Mayonnaise and salad dressings.  Eat Less Often/Avoid: Coconut oils, palm oils, butter, stick margarine,  cream, half and half, cookies, candy, pie. FOR MORE INFORMATION The Dash Diet Eating Plan: www.dashdiet.org Document Released: 02/12/2011 Document Revised: 05/18/2011 Document Reviewed: 02/12/2011 [facility] Patient Information 2014 [place], [place].  Hypertension As your heart beats, it forces blood through your arteries. This force is your blood pressure. If the pressure is too high, it is called hypertension (HTN) or high blood pressure. HTN is dangerous because you may have it and not know it. High blood pressure may mean that your heart has to work harder to pump blood. Your arteries may be narrow or stiff. The extra work puts you at risk for heart disease, stroke, and other problems.  Blood pressure consists of two numbers, a higher number over a lower, 110/72, for example. It is stated as "110 over 72." The  ideal is below 120 for the top number (systolic) and under 80 for the bottom (diastolic). Write down your blood pressure today. You should pay close attention to your blood pressure if you have certain conditions such as:  Heart failure.  Prior heart attack.  Diabetes  Chronic kidney disease.  Prior stroke.  Multiple risk factors for heart disease. To see if you have HTN, your blood pressure should be measured while you are seated with your arm held at the level of the heart. It should be measured at least twice. A one-time elevated blood pressure reading (especially in the Emergency Department) does not mean that you need treatment. There may be conditions in which the blood pressure is different between your right and left arms. It is important to see your caregiver soon for a recheck. Most people have essential hypertension which means that there is not a specific cause. This type of high blood pressure may be lowered by changing lifestyle factors such as:  Stress.  Smoking.  Lack of exercise.  Excessive weight.  Drug/tobacco/alcohol use.  Eating less salt. Most people do not have symptoms from high blood pressure until it has caused damage to the body. Effective treatment can often prevent, delay or reduce that damage. TREATMENT  When a cause has been identified, treatment for high blood pressure is directed at the cause. There are a large number of medications to treat HTN. These fall into several categories, and your caregiver will help you select the medicines that are best for you. Medications may have side effects. You should review side effects with your caregiver. If your blood pressure stays high after you have made lifestyle changes or started on medicines,   Your medication(s) may need to be changed.  Other problems may need to be addressed.  Be certain you understand your prescriptions, and know how and when to take your medicine.  Be sure to follow up with your  caregiver within the time frame advised (usually within two weeks) to have your blood pressure rechecked and to review your medications.  If you are taking more than one medicine to lower your blood pressure, make sure you know how and at what times they should be taken. Taking two medicines at the same time can result in blood pressure that is too low. SEEK IMMEDIATE MEDICAL CARE IF:  You develop a severe headache, blurred or changing vision, or confusion.  You have unusual weakness or numbness, or a faint feeling.  You have severe chest or abdominal pain, vomiting, or breathing problems. MAKE SURE YOU:   Understand these instructions.  Will watch your condition.  Will get help right away if you are not doing well or  get worse. Document Released: 02/23/2005 Document Revised: 05/18/2011 Document Reviewed: 10/14/2007 [facility] Patient Information 2014 [place], [place].  Type 2 Diabetes Mellitus, Adult Type 2 diabetes mellitus, often simply referred to as type 2 diabetes, is a long-lasting (chronic) disease. In type 2 diabetes, the pancreas does not make enough insulin (a hormone), the cells are less responsive to the insulin that is made (insulin resistance), or both. Normally, insulin moves sugars from food into the tissue cells. The tissue cells use the sugars for energy. The lack of insulin or the lack of normal response to insulin causes excess sugars to build up in the blood instead of going into the tissue cells. As a result, high blood sugar (hyperglycemia) develops. The effect of high sugar (glucose) levels can cause many complications. Type 2 diabetes was also previously called adult-onset diabetes but it can occur at any age.  RISK FACTORS  A person is predisposed to developing type 2 diabetes if someone in the family has the disease and also has one or more of the following primary risk factors:  Overweight.  An inactive lifestyle.  A history of consistently eating  high-calorie foods. Maintaining a normal weight and regular physical activity can reduce the chance of developing type 2 diabetes. SYMPTOMS  A person with type 2 diabetes may not show symptoms initially. The symptoms of type 2 diabetes appear slowly. The symptoms include:  Increased thirst (polydipsia).  Increased urination (polyuria).  Increased urination during the night (nocturia).  Weight loss. This weight loss may be rapid.  Frequent, recurring infections.  Tiredness (fatigue).  Weakness.  Vision changes, such as blurred vision.  Fruity smell to your breath.  Abdominal pain.  Nausea or vomiting.  Cuts or bruises which are slow to heal.  Tingling or numbness in the hands or feet. DIAGNOSIS Type 2 diabetes is frequently not diagnosed until complications of diabetes are present. Type 2 diabetes is diagnosed when symptoms or complications are present and when blood glucose levels are increased. Your blood glucose level may be checked by one or more of the following blood tests:  A fasting blood glucose test. You will not be allowed to eat for at least 8 hours before a blood sample is taken.  A random blood glucose test. Your blood glucose is checked at any time of the day regardless of when you ate.  A hemoglobin A1c blood glucose test. A hemoglobin A1c test provides information about blood glucose control over the previous 3 months.  An oral glucose tolerance test (OGTT). Your blood glucose is measured after you have not eaten (fasted) for 2 hours and then after you drink a glucose-containing beverage. TREATMENT   You may need to take insulin or diabetes medicine daily to keep blood glucose levels in the desired range.  You will need to match insulin dosing with exercise and healthy food choices. The treatment goal is to maintain the before meal blood sugar (preprandial glucose) level at 70 130 mg/dL. HOME CARE INSTRUCTIONS   Have your hemoglobin A1c level checked  twice a year.  Perform daily blood glucose monitoring as directed by your caregiver.  Monitor urine ketones when you are ill and as directed by your caregiver.  Take your diabetes medicine or insulin as directed by your caregiver to maintain your blood glucose levels in the desired range.  Never run out of diabetes medicine or insulin. It is needed every day.  Adjust insulin based on your intake of carbohydrates. Carbohydrates can raise blood glucose levels but  need to be included in your diet. Carbohydrates provide vitamins, minerals, and fiber which are an essential part of a healthy diet. Carbohydrates are found in fruits, vegetables, whole grains, dairy products, legumes, and foods containing added sugars.    Eat healthy foods. Alternate 3 meals with 3 snacks.  Lose weight if overweight.  Carry a medical alert card or wear your medical alert jewelry.  Carry a 15 gram carbohydrate snack with you at all times to treat low blood glucose (hypoglycemia). Some examples of 15 gram carbohydrate snacks include:  Glucose tablets, 3 or 4   Glucose gel, 15 gram tube  Raisins, 2 tablespoons (24 grams)  Jelly beans, 6  Animal crackers, 8  Regular pop, 4 ounces (120 mL)  Gummy treats, 9  Recognize hypoglycemia. Hypoglycemia occurs with blood glucose levels of 70 mg/dL and below. The risk for hypoglycemia increases when fasting or skipping meals, during or after intense exercise, and during sleep. Hypoglycemia symptoms can include:  Tremors or shakes.  Decreased ability to concentrate.  Sweating.  Increased heart rate.  Headache.  Dry mouth.  Hunger.  Irritability.  Anxiety.  Restless sleep.  Altered speech or coordination.  Confusion.  Treat hypoglycemia promptly. If you are alert and able to safely swallow, follow the 15:15 rule:  Take 15 20 grams of rapid-acting glucose or carbohydrate. Rapid-acting options include glucose gel, glucose tablets, or 4 ounces  (120 mL) of fruit juice, regular soda, or low fat milk.  Check your blood glucose level 15 minutes after taking the glucose.  Take 15 20 grams more of glucose if the repeat blood glucose level is still 70 mg/dL or below.  Eat a meal or snack within 1 hour once blood glucose levels return to normal.    Be alert to polyuria and polydipsia which are early signs of hyperglycemia. An early awareness of hyperglycemia allows for prompt treatment. Treat hyperglycemia as directed by your caregiver.  Engage in at least 150 minutes of moderate-intensity physical activity a week, spread over at least 3 days of the week or as directed by your caregiver. In addition, you should engage in resistance exercise at least 2 times a week or as directed by your caregiver.  Adjust your medicine and food intake as needed if you start a new exercise or sport.  Follow your sick day plan at any time you are unable to eat or drink as usual.  Avoid tobacco use.  Limit alcohol intake to no more than 1 drink per day for nonpregnant women and 2 drinks per day for men. You should drink alcohol only when you are also eating food. Talk with your caregiver whether alcohol is safe for you. Tell your caregiver if you drink alcohol several times a week.  Follow up with your caregiver regularly.  Schedule an eye exam soon after the diagnosis of type 2 diabetes and then annually.  Perform daily skin and foot care. Examine your skin and feet daily for cuts, bruises, redness, nail problems, bleeding, blisters, or sores. A foot exam by a caregiver should be done annually.  Brush your teeth and gums at least twice a day and floss at least once a day. Follow up with your dentist regularly.  Share your diabetes management plan with your workplace or school.  Stay up-to-date with immunizations.  Learn to manage stress.  Obtain ongoing diabetes education and support as needed.  Participate in, or seek rehabilitation as needed  to maintain or improve independence and quality of life.  Request a physical or occupational therapy referral if you are having foot or hand numbness or difficulties with grooming, dressing, eating, or physical activity. SEEK MEDICAL CARE IF:   You are unable to eat food or drink fluids for more than 6 hours.  You have nausea and vomiting for more than 6 hours.  Your blood glucose level is over 240 mg/dL.  There is a change in mental status.  You develop an additional serious illness.  You have diarrhea for more than 6 hours.  You have been sick or have had a fever for a couple of days and are not getting better.  You have pain during any physical activity.  SEEK IMMEDIATE MEDICAL CARE IF:  You have difficulty breathing.  You have moderate to large ketone levels. MAKE SURE YOU:  Understand these instructions.  Will watch your condition.  Will get help right away if you are not doing well or get worse. Document Released: 02/23/2005 Document Revised: 11/18/2011 Document Reviewed: 09/22/2011 [facility] Patient Information 2014 [place], [place].  Diabetes and Exercise Exercising regularly is important. It is not just about losing weight. It has many health benefits, such as:  Improving your overall fitness, flexibility, and endurance.  Increasing your bone density.  Helping with weight control.  Decreasing your body fat.  Increasing your muscle strength.  Reducing stress and tension.  Improving your overall health. People with diabetes who exercise gain additional benefits because exercise:  Reduces appetite.  Improves the body's use of blood sugar (glucose).  Helps lower or control blood glucose.  Decreases blood pressure.  Helps control blood lipids (such as cholesterol and triglycerides).  Improves the body's use of the hormone insulin by:  Increasing the body's insulin sensitivity.  Reducing the body's insulin needs.  Decreases the risk for heart  disease because exercising:  Lowers cholesterol and triglycerides levels.  Increases the levels of good cholesterol (such as high-density lipoproteins [HDL]) in the body.  Lowers blood glucose levels. YOUR ACTIVITY PLAN  Choose an activity that you enjoy and set realistic goals. Your health care provider or diabetes educator can help you make an activity plan that works for you. You can break activities into 2 or 3 sessions throughout the day. Doing so is as good as one long session. Exercise ideas include:  Taking the dog for a walk.  Taking the stairs instead of the elevator.  Dancing to your favorite song.  Doing your favorite exercise with a friend. RECOMMENDATIONS FOR EXERCISING WITH TYPE 1 OR TYPE 2 DIABETES   Check your blood glucose before exercising. If blood glucose levels are greater than 240 mg/dL, check for urine ketones. Do not exercise if ketones are present.  Avoid injecting insulin into areas of the body that are going to be exercised. For example, avoid injecting insulin into:  The arms when playing tennis.  The legs when jogging.  Keep a record of:  Food intake before and after you exercise.  Expected peak times of insulin action.  Blood glucose levels before and after you exercise.  The type and amount of exercise you have done.  Review your records with your health care provider. Your health care provider will help you to develop guidelines for adjusting food intake and insulin amounts before and after exercising.  If you take insulin or oral hypoglycemic agents, watch for signs and symptoms of hypoglycemia. They include:  Dizziness.  Shaking.  Sweating.  Chills.  Confusion.  Drink plenty of water while you exercise to prevent  dehydration or heat stroke. Body water is lost during exercise and must be replaced.  Talk to your health care provider before starting an exercise program to make sure it is safe for you. Remember, almost any type of  activity is better than none. Document Released: 05/16/2003 Document Revised: 10/26/2012 Document Reviewed: 08/02/2012 [facility] Patient Information 2014 [place], [place].   Treatment Goals:  Goals (1 Years of Data) as of 01/12/13         As of Today 10/10/12 10/10/12 10/10/12 10/10/12     Blood Pressure    . Blood Pressure < 140/90  122/79 143/77 149/98 151/82 144/96     Result Component    . HEMOGLOBIN A1C < 7.0  6.1          Progress Toward Treatment Goals:  Treatment Goal 01/12/2013  Hemoglobin A1C at goal  Blood pressure at goal  Prevent falls -    Self Care Goals & Plans:  Self Care Goal 01/12/2013  Manage my medications take my medicines as prescribed; bring my medications to every visit; refill my medications on time  Monitor my health check my feet daily; keep track of my blood pressure; keep track of my weight  Eat healthy foods eat foods that are low in salt; eat baked foods instead of fried foods; drink diet soda or water instead of juice or soda; eat more vegetables; eat fruit for snacks and desserts; eat smaller portions  Be physically active park at the far end of the parking lot; find an activity I enjoy  Prevent falls -  Meeting treatment goals maintain the current self-care plan    Home Blood Glucose Monitoring 01/12/2013  Check my blood sugar no home glucose monitoring  When to check my blood sugar N/A     Care Management & Community Referrals:  Referral 01/12/2013  Referrals made for care management support none needed  Referrals made to community resources none

## 2013-01-13 ENCOUNTER — Encounter: Payer: Self-pay | Admitting: Internal Medicine

## 2013-01-14 ENCOUNTER — Encounter: Payer: Self-pay | Admitting: Internal Medicine

## 2013-01-14 DIAGNOSIS — R109 Unspecified abdominal pain: Secondary | ICD-10-CM | POA: Insufficient documentation

## 2013-01-14 MED ORDER — METFORMIN HCL 500 MG PO TABS: 500.0000 mg | ORAL_TABLET | Freq: Every day | ORAL | Status: DC

## 2013-01-14 NOTE — Assessment & Plan Note (Signed)
Lab Results  Component Value Date   HGBA1C 6.1 01/12/2013   HGBA1C 6.0 07/25/2012   HGBA1C 6.1 04/28/2012     Assessment: Diabetes control: good control (HgbA1C at goal) Progress toward A1C goal:  at goal Comments: none   Plan: Medications:  continue current medications (Metformin 500 mg qam) Home glucose monitoring: Frequency: no home glucose monitoring Timing: N/A Instruction/counseling given: reminded to bring medications to each visit Educational resources provided: brochure Self management tools provided: home glucose logbook Other plans: none

## 2013-01-14 NOTE — Assessment & Plan Note (Signed)
HM: Given flu vaccine today, check lipid panel, HA1C, will need repeat DEXA 07/2013

## 2013-01-14 NOTE — Assessment & Plan Note (Signed)
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  Advised DDI with Coumadin though pt follows with cardiology with INR Will check tsh Rx Refill Norco/Vicodin q4 hours prn established pain contract If this fails consider MS Contin or long acting pain managment

## 2013-01-14 NOTE — Assessment & Plan Note (Addendum)
Pain is epigastric and LUQ Will check CMET Will check rib Xray to r/o fracture-it was negative Consider US abdomen to w/u for splenic infarction with history of Afib if pain is not resolving Return if not resolving.   She is on Prilosec

## 2013-01-14 NOTE — Assessment & Plan Note (Signed)
BP Readings from Last 3 Encounters:  01/12/13 122/79  10/10/12 143/77  10/06/12 110/82    Lab Results  Component Value Date   NA 140 01/12/2013   K 4.4 01/12/2013   CREATININE 0.82 01/12/2013    Assessment: Blood pressure control: controlled Progress toward BP goal:  at goal Comments: none  Plan: Medications:  continue current medications (Atenolol 37.5 bid, Cardizem 180 mg qd, Lasix 20 mg qd, Losartan 12.5 mg qd) Educational resources provided: brochure Self management tools provided: home blood pressure logbook Other plans: check CMET today

## 2013-01-14 NOTE — Assessment & Plan Note (Addendum)
Chronic likely related to tension/social stressors  Can try prn Tylenol.  Decrease stressors if possible Return if worsening prn  She is on Elavil as well

## 2013-01-14 NOTE — Assessment & Plan Note (Signed)
Will check tsh today  

## 2013-01-16 NOTE — Progress Notes (Signed)
Case discussed with Dr. McLean at the time of the visit.  We reviewed the resident's history and exam and pertinent patient test results.  I agree with the assessment, diagnosis and plan of care documented in the resident's note. 

## 2013-01-19 ENCOUNTER — Ambulatory Visit (INDEPENDENT_AMBULATORY_CARE_PROVIDER_SITE_OTHER): Payer: Medicare Other | Admitting: *Deleted

## 2013-01-19 DIAGNOSIS — G459 Transient cerebral ischemic attack, unspecified: Secondary | ICD-10-CM | POA: Diagnosis not present

## 2013-01-19 DIAGNOSIS — Z7901 Long term (current) use of anticoagulants: Secondary | ICD-10-CM

## 2013-01-19 DIAGNOSIS — I4891 Unspecified atrial fibrillation: Secondary | ICD-10-CM

## 2013-01-19 LAB — POCT INR: INR: 2.1

## 2013-01-23 ENCOUNTER — Ambulatory Visit (HOSPITAL_COMMUNITY): Payer: Medicare Other

## 2013-01-30 ENCOUNTER — Ambulatory Visit (HOSPITAL_COMMUNITY): Payer: Medicare Other | Attending: Cardiology

## 2013-01-30 DIAGNOSIS — I6529 Occlusion and stenosis of unspecified carotid artery: Secondary | ICD-10-CM

## 2013-02-10 ENCOUNTER — Ambulatory Visit (HOSPITAL_COMMUNITY): Payer: Medicare Other

## 2013-02-17 ENCOUNTER — Ambulatory Visit (HOSPITAL_COMMUNITY)
Admission: RE | Admit: 2013-02-17 | Discharge: 2013-02-17 | Disposition: A | Discharge status: Home / Self Care | Payer: Medicare Other | Source: Ambulatory Visit | Attending: Internal Medicine | Admitting: Internal Medicine

## 2013-02-17 DIAGNOSIS — Z1231 Encounter for screening mammogram for malignant neoplasm of breast: Secondary | ICD-10-CM | POA: Diagnosis not present

## 2013-02-20 ENCOUNTER — Encounter: Payer: Self-pay | Admitting: Internal Medicine

## 2013-02-23 ENCOUNTER — Ambulatory Visit (INDEPENDENT_AMBULATORY_CARE_PROVIDER_SITE_OTHER): Payer: Medicare Other | Admitting: *Deleted

## 2013-02-23 DIAGNOSIS — G459 Transient cerebral ischemic attack, unspecified: Secondary | ICD-10-CM | POA: Diagnosis not present

## 2013-02-23 DIAGNOSIS — I4891 Unspecified atrial fibrillation: Secondary | ICD-10-CM | POA: Diagnosis not present

## 2013-02-23 DIAGNOSIS — Z7901 Long term (current) use of anticoagulants: Secondary | ICD-10-CM | POA: Diagnosis not present

## 2013-02-23 LAB — POCT INR: INR: 4

## 2013-03-10 ENCOUNTER — Ambulatory Visit (INDEPENDENT_AMBULATORY_CARE_PROVIDER_SITE_OTHER): Payer: Medicare Other | Admitting: *Deleted

## 2013-03-10 DIAGNOSIS — I4891 Unspecified atrial fibrillation: Secondary | ICD-10-CM | POA: Diagnosis not present

## 2013-03-10 DIAGNOSIS — G459 Transient cerebral ischemic attack, unspecified: Secondary | ICD-10-CM

## 2013-03-10 DIAGNOSIS — Z7901 Long term (current) use of anticoagulants: Secondary | ICD-10-CM

## 2013-03-10 LAB — POCT INR: INR: 2.4

## 2013-03-28 ENCOUNTER — Telehealth: Payer: Self-pay | Admitting: *Deleted

## 2013-03-28 ENCOUNTER — Encounter (HOSPITAL_COMMUNITY): Payer: Self-pay | Admitting: Emergency Medicine

## 2013-03-28 ENCOUNTER — Emergency Department (HOSPITAL_COMMUNITY)
Admission: EM | Admit: 2013-03-28 | Discharge: 2013-03-28 | Disposition: A | Discharge status: Home / Self Care | Payer: Medicare Other | Attending: Emergency Medicine | Admitting: Emergency Medicine

## 2013-03-28 DIAGNOSIS — Z91040 Latex allergy status: Secondary | ICD-10-CM | POA: Diagnosis not present

## 2013-03-28 DIAGNOSIS — E1149 Type 2 diabetes mellitus with other diabetic neurological complication: Secondary | ICD-10-CM | POA: Insufficient documentation

## 2013-03-28 DIAGNOSIS — E785 Hyperlipidemia, unspecified: Secondary | ICD-10-CM | POA: Diagnosis not present

## 2013-03-28 DIAGNOSIS — Z85828 Personal history of other malignant neoplasm of skin: Secondary | ICD-10-CM | POA: Diagnosis not present

## 2013-03-28 DIAGNOSIS — K089 Disorder of teeth and supporting structures, unspecified: Secondary | ICD-10-CM | POA: Insufficient documentation

## 2013-03-28 DIAGNOSIS — F3289 Other specified depressive episodes: Secondary | ICD-10-CM | POA: Diagnosis not present

## 2013-03-28 DIAGNOSIS — E669 Obesity, unspecified: Secondary | ICD-10-CM | POA: Diagnosis not present

## 2013-03-28 DIAGNOSIS — I4891 Unspecified atrial fibrillation: Secondary | ICD-10-CM | POA: Diagnosis not present

## 2013-03-28 DIAGNOSIS — Z880 Allergy status to penicillin: Secondary | ICD-10-CM | POA: Insufficient documentation

## 2013-03-28 DIAGNOSIS — F329 Major depressive disorder, single episode, unspecified: Secondary | ICD-10-CM | POA: Diagnosis not present

## 2013-03-28 DIAGNOSIS — Z8619 Personal history of other infectious and parasitic diseases: Secondary | ICD-10-CM | POA: Insufficient documentation

## 2013-03-28 DIAGNOSIS — E1142 Type 2 diabetes mellitus with diabetic polyneuropathy: Secondary | ICD-10-CM | POA: Diagnosis not present

## 2013-03-28 DIAGNOSIS — I10 Essential (primary) hypertension: Secondary | ICD-10-CM | POA: Diagnosis not present

## 2013-03-28 DIAGNOSIS — E039 Hypothyroidism, unspecified: Secondary | ICD-10-CM | POA: Insufficient documentation

## 2013-03-28 DIAGNOSIS — Z8673 Personal history of transient ischemic attack (TIA), and cerebral infarction without residual deficits: Secondary | ICD-10-CM | POA: Diagnosis not present

## 2013-03-28 DIAGNOSIS — I509 Heart failure, unspecified: Secondary | ICD-10-CM | POA: Insufficient documentation

## 2013-03-28 DIAGNOSIS — Z8739 Personal history of other diseases of the musculoskeletal system and connective tissue: Secondary | ICD-10-CM | POA: Diagnosis not present

## 2013-03-28 DIAGNOSIS — Z7901 Long term (current) use of anticoagulants: Secondary | ICD-10-CM | POA: Insufficient documentation

## 2013-03-28 DIAGNOSIS — K219 Gastro-esophageal reflux disease without esophagitis: Secondary | ICD-10-CM | POA: Diagnosis not present

## 2013-03-28 DIAGNOSIS — K0889 Other specified disorders of teeth and supporting structures: Secondary | ICD-10-CM

## 2013-03-28 DIAGNOSIS — K0381 Cracked tooth: Secondary | ICD-10-CM | POA: Insufficient documentation

## 2013-03-28 DIAGNOSIS — Z79899 Other long term (current) drug therapy: Secondary | ICD-10-CM | POA: Diagnosis not present

## 2013-03-28 DIAGNOSIS — R509 Fever, unspecified: Secondary | ICD-10-CM | POA: Diagnosis not present

## 2013-03-28 DIAGNOSIS — E119 Type 2 diabetes mellitus without complications: Secondary | ICD-10-CM | POA: Insufficient documentation

## 2013-03-28 DIAGNOSIS — K0500 Acute gingivitis, plaque induced: Secondary | ICD-10-CM | POA: Diagnosis not present

## 2013-03-28 DIAGNOSIS — K0510 Chronic gingivitis, plaque induced: Secondary | ICD-10-CM

## 2013-03-28 MED ORDER — CLINDAMYCIN HCL 150 MG PO CAPS: 300.0000 mg | ORAL_CAPSULE | Freq: Three times a day (TID) | ORAL | Status: DC

## 2013-03-28 MED ORDER — IBUPROFEN 800 MG PO TABS: 800.0000 mg | ORAL_TABLET | Freq: Three times a day (TID) | ORAL | Status: DC

## 2013-03-28 NOTE — ED Notes (Signed)
Pt reports having broken front tooth, has been using super glue to put her tooth back together for over a year. Used the super glue last night and now having swelling to her lips and gum. Airway is intact. Pt thinks possible allergic reaction, has not taken any benadryl pta. Appears possible dental infection.

## 2013-03-28 NOTE — ED Provider Notes (Signed)
CSN: [id number]     Arrival date & time 03/28/13  1323 History   First MD Initiated Contact with Patient 03/28/13 1540  This chart was scribed for non-physician practitioner Jeannett Senior, PA-C working with Mervin Kung, MD by Anastasia Pall, ED scribe. This patient was seen in room TR09C/TR09C and the patient's care was started at 4:08 PM.    Chief Complaint  Patient presents with  . Allergic Reaction  . Dental Pain    The history is provided by the patient. No language interpreter was used.   HPI Comments: [name] is a 63 y.o. female who presents to the Emergency Department complaining of an allergic reaction, with swelling over her right sided lips, gums, and an associated subjective fever (of 98), onset last night. She reports a h/o broken front tooth, dental carries, crowns, and extractions. She states she could not afford to go back to the dentist, so she has been gluing her crown with super glue for the past year. She reports she tried to glue it on all day yesterday, but it wouldn't stick. She denies having had any Benadryl for her symptoms. Pt is allergic to Penicillin. She denies throat swelling, and any other associated symptoms. She denies a h/o smoking.    PCP - Cresenciano Genre, MD  Past Medical History  Diagnosis Date  . Depression   . Obesity   . Skin neoplasm   . Bunion   . Carotid stenosis   . Fibromyalgia   . Internal hemorrhoid   . GERD (gastroesophageal reflux disease)   . Chronic gastritis   . Transaminase or LDH elevation   . Mild cognitive impairment   . Hyperlipidemia   . Fatty liver   . Lumbar back pain   . Diabetic peripheral neuropathy   . Dysphagia     no documented strictures but responded positively to dilation in past.   . Hypertension   . Cholecystitis     s/p cholecystectomy  . Sarcoidosis   . Fibromyalgia   . CHF (congestive heart failure)   . Skin cancer 1990's    "front of my right leg"  . Atrial fibrillation   . OSA  (obstructive sleep apnea) 2003    "can't sleep w/that darm machine"  . Exertional dyspnea   . Hypothyroidism   . Type II diabetes mellitus   . TIA (transient ischemic attack)     07/29/11 pt denies this history  . H/O hiatal hernia   . Epileptic seizure, tonic     .No meds since age of 55.  . Osteoarthritis   . Stroke     tia  . Hypothyroid   . Diverticulosis   . Digoxin toxicity     08/2011  . Bradycardia, drug induced     08/2011 (dig)  . Drug-induced hypotension     08/2011 (Dig)   Past Surgical History  Procedure Laterality Date  . Hiatal hernia repair  1990    "had to have scar tissue removed 6 months after repair"  . Esophageal dilation    . Cataract extraction w/phaco  10/27/2010    Procedure: CATARACT EXTRACTION PHACO AND INTRAOCULAR LENS PLACEMENT (IOC);  Surgeon: Williams Che;  Location: [facility];  Service: Ophthalmology;  Laterality: Left;  CDE- 1.78  . Cataract extraction w/phaco  07/13/2011    Procedure: CATARACT EXTRACTION PHACO AND INTRAOCULAR LENS PLACEMENT (IOC);  Surgeon: Williams Che, MD;  Location: [facility];  Service: Ophthalmology;  Laterality: Right;  CDE:  1.39  . Liposuction  1992  . Inverted nipples  1992  . Breast surgery  ~ 2010    excision milk duct; right breast  . Foot surgery  ~ 2011    ~straightened toe left foot & scraped bone below big toe"  . Foot surgery  ~ 2011    "scraped bone of big toe; shortened middle toe; right foot"  . Cholecystectomy  1980's  . Dilation and curettage of uterus  1970; 1976  . Abdominal hysterectomy  1980's    partial  . Tubal ligation  1976  . Bilateral oophorectomy  1980's    "after partial hysterectomy"  . Skin cancer excision  1990's    "front side of right shin"  . Ventral hernia repair  07/29/2011    Procedure: LAPAROSCOPIC VENTRAL HERNIA;  Surgeon: Adin Hector, MD;  Location: [facility];  Service: General;  Laterality: N/A;  multiple incarcerated hernias with mesh  . Other surgical history      mutiple  ab surgeries for abdominal hernia  . Other surgical history      multiple dilation of esophagus  . Other surgical history      right lens implant 07/2011, left lens implant 10/2010  . Other surgical history      right breast bx=benign   Family History  Problem Relation Age of Onset  . Crohn's disease Mother   . Colitis Mother     Crohns  . Anesthesia problems Mother   . Arthritis Mother     rheumatoid; maternal side of family also with RA  . Cancer Father     skin  . Diabetes Father   . Heart disease Father   . Melanoma Father   . Coronary artery disease Father   . Diabetes Sister   . Depression Maternal Uncle   . Dementia Maternal Uncle   . Hypotension Neg Hx   . Malignant hyperthermia Neg Hx   . Pseudochol deficiency Neg Hx   . Colon cancer Paternal Uncle     ? if stomach or colon    History  Substance Use Topics  . Smoking status: Never Smoker   . Smokeless tobacco: Never Used  . Alcohol Use: No   OB History   Grav Para Term Preterm Abortions TAB SAB Ect Mult Living                 Review of Systems  Constitutional: Positive for fever (subjective).  HENT: Positive for dental problem (pain, broken front tooth) and facial swelling (over right lips, gums). Negative for trouble swallowing.     Allergies  Gemfibrozil; Latex; Penicillins; Adhesive; Codeine; Ace inhibitors; and Digoxin and related  Home Medications   Current Outpatient Rx  Name  Route  Sig  Dispense  Refill  . ALPRAZolam (XANAX) 0.5 MG tablet   Oral   Take 0.5 mg by mouth at bedtime as needed. For anxiety/sleep.         Marland Kitchen amitriptyline (ELAVIL) 100 MG tablet   Oral   Take 0.5 tablets (50 mg total) by mouth at bedtime.   45 tablet   1   . atenolol (TENORMIN) 25 MG tablet   Oral   Take 1.5 tablets (37.5 mg total) by mouth 2 (two) times daily.   135 tablet   4   . calcium carbonate (OS-CAL) 600 MG TABS   Oral   Take 600 mg by mouth daily.         . cyclobenzaprine (FLEXERIL) 5  MG  tablet   Oral   Take 5 mg by mouth 2 (two) times daily as needed. For fibromyalgia         . diltiazem (DILACOR XR) 180 MG 24 hr capsule   Oral   Take 1 capsule (180 mg total) by mouth daily.   90 capsule   4   . FLUoxetine (PROZAC) 20 MG tablet   Oral   Take 1 tablet (20 mg total) by mouth daily.   90 tablet   4   . furosemide (LASIX) 20 MG tablet   Oral   Take 1 tablet (20 mg total) by mouth daily.   90 tablet   4   . gabapentin (NEURONTIN) 100 MG capsule   Oral   Take 1 capsule (100 mg total) by mouth 3 (three) times daily.   270 capsule   4   . HYDROcodone-acetaminophen (NORCO/VICODIN) 5-325 MG per tablet   Oral   Take 1 tablet by mouth every 6 (six) hours as needed for moderate pain.   120 tablet   0   . ibuprofen (ADVIL,MOTRIN) 800 MG tablet   Oral   Take 1 tablet (800 mg total) by mouth every 8 (eight) hours as needed.   90 tablet   0   . levothyroxine (SYNTHROID, LEVOTHROID) 75 MCG tablet   Oral   Take 1 tablet (75 mcg total) by mouth every morning.   90 tablet   4   . losartan (COZAAR) 25 MG tablet   Oral   Take 0.5 tablets (12.5 mg total) by mouth daily.   90 tablet   1   . metFORMIN (GLUCOPHAGE) 500 MG tablet   Oral   Take 1 tablet (500 mg total) by mouth daily with breakfast.   30 tablet   5   . omeprazole (PRILOSEC) 20 MG capsule   Oral   Take 1 capsule (20 mg total) by mouth daily.   90 capsule   4   . potassium chloride (K-DUR) 10 MEQ tablet   Oral   Take 1 tablet (10 mEq total) by mouth daily.   90 tablet   4   . pravastatin (PRAVACHOL) 40 MG tablet   Oral   Take 1 tablet (40 mg total) by mouth daily.   90 tablet   4     Please decrease current dose from 80 mg qhs to 40  ...   . warfarin (COUMADIN) 5 MG tablet   Oral   Take 2.5-5 mg by mouth daily. Take 5mg  on Sunday and Thursday. All other days take 2.5mg .          BP 138/75  Pulse 87  Temp(Src) 98.8 F (37.1 C) (Oral)  Resp 17  Wt 198 lb (89.812 kg)  SpO2  98%  LMP 06/03/1978  Physical Exam  Nursing note and vitals reviewed. Constitutional: She is oriented to person, place, and time. She appears well-developed and well-nourished. No distress.  HENT:  Head: Normocephalic and atraumatic.  Right frontal central incisor broken off to the gumline, withl decay. Surrounding gum and lip swelling. No obvious abscess. Widespread gingivitis.  Eyes: EOM are normal.  Neck: Neck supple.  Cardiovascular: Normal rate.   Pulmonary/Chest: Effort normal. No respiratory distress.  Musculoskeletal: Normal range of motion.  Lymphadenopathy:    She has no cervical adenopathy.  Neurological: She is alert and oriented to person, place, and time.  Skin: Skin is warm and dry.  Psychiatric: She has a normal mood and affect. Her  behavior is normal.    ED Course  Procedures (including critical care time)  DIAGNOSTIC STUDIES: Oxygen Saturation is 98% on room air, normal by my interpretation.    COORDINATION OF CARE: 4:11 PM-Discussed treatment plan which includes antibiotic and Ibuprofen with pt at bedside and pt agreed to plan. Will give referral to dentist.    Labs Review Labs Reviewed - No data to display Imaging Review No results found.  EKG Interpretation   None      Medications - No data to display  MDM   1. Pain, dental   2. Gingivitis     Patient with dental decay, broken off right front central incisor, with decay in the remaining part. There is surrounding gum erythema and swelling. Patient's lip appears to be mildly swollen and tender as well. There is no definite palpable abscess. There is no trismus or swelling under the tongue. I do not think this is an allergic reaction but most likely cellulitis and possible early abscess. Will start her on clindamycin given she is allergic to penicillins. She'll be going home with antibiotic and pain medication. Followup with a dentist.  I personally performed the services described in this  documentation, which was scribed in my presence. The recorded information has been reviewed and is accurate.    Renold Genta, PA-C 03/28/13 1631

## 2013-03-28 NOTE — Discharge Instructions (Signed)
Take clindamycin for infection until all gone. Ibuprofen for pain. Follow up with a dentist.    Emergency Department Resource Guide 1) Find a Doctor and Pay Out of Pocket Although you won't have to find out who is covered by your insurance plan, it is a good idea to ask around and get recommendations. You will then need to call the office and see if the doctor you have chosen will accept you as a new patient and what types of options they offer for patients who are self-pay. Some doctors offer discounts or will set up payment plans for their patients who do not have insurance, but you will need to ask so you aren't surprised when you get to your appointment.  2) Contact Your Local Health Department Not all health departments have doctors that can see patients for sick visits, but many do, so it is worth a call to see if yours does. If you don't know where your local health department is, you can check in your phone book. The CDC also has a tool to help you locate your state's health department, and many state websites also have listings of all of their local health departments.  3) Find a [facility] If your illness is not likely to be very severe or complicated, you may want to try a walk in clinic. These are popping up all over the country in pharmacies, drugstores, and shopping centers. They're usually staffed by nurse practitioners or physician assistants that have been trained to treat common illnesses and complaints. They're usually fairly quick and inexpensive. However, if you have serious medical issues or chronic medical problems, these are probably not your best option.  No Primary Care Doctor: - Call Health Connect at  [phone] - they can help you locate a primary care doctor that  accepts your insurance, provides certain services, etc. - Physician Referral Service- [phone]  Chronic Pain Problems: Organization         Address  Phone   Notes  [facility]   [phone] Patients need to be referred by their primary care doctor.   Medication Assistance: Organization         Address  Phone   Notes  [facility] Medication [facility] [address] [place], [place] [address] [phone] --Must be a resident of [facility] -- Must have NO insurance coverage whatsoever (no Medicaid/ Medicare, etc.) -- The pt. MUST have a primary care doctor that directs their care regularly and follows them in the community   MedAssist  [phone]   [employer]  [phone]    Agencies that provide inexpensive medical care: Organization         Address  Phone   Notes  [place]  [phone]   [facility]    [phone]   [facility] [place], [place] [address] [phone]   [place] [address] [place] [phone]   Planned Parenthood    [phone]   [facility]    [phone]   [place] and [place] [address], [place] Phone:  [phone], Fax:  [phone] Hours of Operation:  9 am - 6 pm, M-F.  Also accepts Medicaid/Medicare and self-pay.  [facility] for [place], [address], [place] Phone: [phone], Fax: [phone]. Hours of Operation:  8:30 am - 5:30 pm, M-F.  Also accepts Medicaid and self-pay.  [facility] [place] [address] [place] Phone: [phone]   [place], [place], [place] [phone], Ext. [phone] Mondays & Thursdays: 7-9 AM.  First 15 patients are seen on a first come, first serve basis.    [place] Providers:  Organization         Address  Phone   Notes  [facility] [address], [place] [phone] Also accepts self-pay patients.  [facility] [address], [place]  [phone]   [place], [address], [place] [phone]   [facility] [address], [place] [phone]   Lucianne Lei [address], [place]   [phone] Only accepts [place] Access [place] patients after they have their name applied to their card.   Self-Pay (no insurance) in [facility]:  Organization         Address  Phone   Notes  Sickle Cell Patients, [facility] [place] [phone]   [facility] Urgent Care [place] [phone]   [facility] [place]  [place], [place], [employer] [phone]   [facility]/Dr. Osei-Bonsu  [address] [place] or [address], [place] [phone] Phone number for both [place] and [place] locations is the same.  Urgent Medical and [facility] [address], [place] [phone]   [facility] [address] [place] or [address] [phone]   [facility] [address] [place] [phone], phone; [phone], fax Sees patients 1st and 3rd Saturday of every month.  Must not qualify for public or private insurance (i.e. Medicaid, Medicare, [place] Health Choice, Veterans' Benefits)  Household income should be no more than 200% of the poverty level The clinic cannot treat you if you are pregnant or think you are pregnant  Sexually transmitted diseases are not treated at the clinic.    Dental Care: Organization         Address  Phone  Notes  [facility] Department of [facility] [place] [phone] Accepts children up to age 10 who are enrolled in [place] or [place]; pregnant women with a Medicaid card; and children who have applied for Medicaid or [place] Health  Choice, but were declined, whose parents can pay a reduced fee at time of service.  [facility] Department of [facility]  [address], [place] [phone] Accepts children up to age 32 who are enrolled in [place] or [place]; pregnant women with a Medicaid card; and children who have applied for Medicaid or [place] Health Choice, but were declined, whose parents can pay a reduced fee at time of service.  [place] Adult Dental Access PROGRAM  [place] [phone] Patients are seen by appointment only. Walk-ins are not accepted. [place] will see patients 78 years of age and older. Monday - Tuesday (8am-5pm) Most Wednesdays (8:30-5pm) $30 per visit, cash only  [facility] Adult Dental Access PROGRAM  [address], [facility] [phone] Patients are seen by appointment  only. Walk-ins are not accepted. [place] will see patients 69 years of age and older. One Wednesday Evening (Monthly: Volunteer Based).  $30 per visit, cash only  [place]  [phone] for adults; Children under age 5, call Graduate Pediatric Dentistry at [phone]. Children aged 58-14, please call [phone] to request a pediatric application.  Dental services are provided in all areas of dental care including fillings, crowns and bridges, complete and partial dentures, implants, gum treatment, root canals, and extractions. Preventive care is also provided. Treatment is provided to both adults and children. Patients are selected via a lottery and there is often a waiting list.   [facility] [address], [place]  [phone] [email or web address]   [facility] [address] [place], [place] [phone], Ext. [phone] Second and Fourth Thursday of each month, opens at 6:30 AM; Clinic ends at 9 AM.  Patients are seen on a first-come first-served basis, and a limited number are seen during each  clinic.   [facility]  [address] [place], [place] [phone]   Eligibility Requirements You must have lived in [place], [place], or [place] counties for at least the last three months.   You cannot be eligible for state or federal sponsored [employer], including [employer], [place], or [employer].   You generally cannot be eligible for healthcare insurance through your employer.    How to apply: Eligibility screenings are held every Tuesday and Wednesday afternoon from 1:00 pm until 4:00 pm. You do not need an appointment for the interview!  [facility] [address] [place], [place]   [place]  [place] Department  [place]  [phone]    Behavioral Health Resources in the Community: Intensive Outpatient Programs Organization         Address  Phone  Notes  [place]. [address] [place], [place] [phone]   [facility] Outpatient [address], [place], [place]   ADS: Alcohol & Drug Svcs [address] [place], [place]   [place] [address]  [place], [place] or [phone]   Substance Abuse Resources Organization         Address  Phone  Notes  Alcohol and Drug Services  [phone]   [place]  8025057661   [facility] [place]   Chinita Pester  [phone]   Residential & Outpatient Substance Abuse Program  [phone]   Psychological Services Organization         Address  Phone  Notes  [facility] [place]  [place]  [phone]   [place] [address] [place] or [phone]    Mobile Crisis Teams Organization         Address  Phone  Notes  [facility]rapeutic Alternatives, Mobile  Crisis Care Unit  [phone]   Assertive Psychotherapeutic Services  [address] [place], [place]   [place] [address] [place] [phone]    Self-Help/Support Groups Organization         Address  Phone             Notes  [place]. of  - variety of support groups  [place] Call for more information  Narcotics Anonymous (NA), [facility] [address], [employer] [place]  2 meetings at this location   Special educational needs teacher         Address  Phone  Notes  [facility]idential Treatment [place],    [place]  [phone]   [facility]  [address] [place] [address], [place], [place]   [place], [place] [phone] Admissions: 8am-3pm M-F  Incentives Substance [place] [address]    [place], [place] [phone]   [facility] [address] [place], [place], [place]   [facility] [address],  [place], [place]   [facility] Programs - Intensive Outpatient [address], [place], [place]   [facility].,  [place], [place] [phone] or [phone]   [facility] (RTS) [address] [place], [place] Accepts Medicaid  [place] [address],  [place] [phone] Substance Abuse/Addiction Treatment   [facility] Organization         Address  Phone  Notes  [facility]  [phone]   Domenic Schwab, PhD [address] Arlis Porta [place], [place]   [phone] or [phone]   [facility] [place], [place] [phone]   [facility] [address], [place], [place] [phone] Insurance/Medicaid/sponsorship through [facility] and Families [address], [address]                                    [place], [place] [phone] [place] [address], [place] [phone]    Dr. Adele Schilder  [phone]   [facility] Dept. 1) [address], [place] 2) [place] 3)  [place] [address], [place] [phone]  [phone]   [place] [phone] or [phone] (After Hours)

## 2013-03-28 NOTE — Telephone Encounter (Signed)
Pt called with c/o reaction to superglue Her mouth is swollen and cheeks are swollen.  Pt called @ [phone] Pt states she has been using superglue to attack a tooth that keeps falling out.  Last night she felt a burning when she used the glue and had slight swelling to top lip.  Today she has not used the glue but has swelling to top and bottom lip. Her cheek area is swollen. She has not taken anything for the swelling. She has not called the DDS.  Swelling seems to be progressing today.    I talked with Dr. Daryll Drown and she advised ED visit. Pt informed and will go to ED now.

## 2013-03-28 NOTE — ED Provider Notes (Addendum)
Medical screening examination/treatment/procedure(s) were conducted as a shared visit with non-physician practitioner(s) and myself.  I personally evaluated the patient during the encounter.  EKG Interpretation   None         Mervin Kung, MD 03/28/13 1647    Correction:  Patient was not a shared visit. Medical screening examination/treatment/procedure(s) were performed by non-physician practitioner and as supervising physician I was immediately available for consultation/collaboration.  EKG Interpretation   None         Mervin Kung, MD 04/11/13 [phone]

## 2013-03-31 ENCOUNTER — Ambulatory Visit (INDEPENDENT_AMBULATORY_CARE_PROVIDER_SITE_OTHER): Payer: Medicare Other | Admitting: *Deleted

## 2013-03-31 DIAGNOSIS — Z7901 Long term (current) use of anticoagulants: Secondary | ICD-10-CM

## 2013-03-31 DIAGNOSIS — G459 Transient cerebral ischemic attack, unspecified: Secondary | ICD-10-CM | POA: Diagnosis not present

## 2013-03-31 DIAGNOSIS — Z5181 Encounter for therapeutic drug level monitoring: Secondary | ICD-10-CM

## 2013-03-31 DIAGNOSIS — I4891 Unspecified atrial fibrillation: Secondary | ICD-10-CM

## 2013-03-31 DIAGNOSIS — Z0000 Encounter for general adult medical examination without abnormal findings: Secondary | ICD-10-CM | POA: Insufficient documentation

## 2013-03-31 LAB — POCT INR: INR: 2.5

## 2013-04-03 ENCOUNTER — Ambulatory Visit (INDEPENDENT_AMBULATORY_CARE_PROVIDER_SITE_OTHER): Payer: Medicare Other | Admitting: Internal Medicine

## 2013-04-03 ENCOUNTER — Other Ambulatory Visit: Payer: Self-pay | Admitting: *Deleted

## 2013-04-03 ENCOUNTER — Encounter: Payer: Self-pay | Admitting: Internal Medicine

## 2013-04-03 ENCOUNTER — Telehealth: Payer: Self-pay | Admitting: *Deleted

## 2013-04-03 VITALS — BP 139/88 | HR 100 | Temp 98.6°F | Ht 63.0 in | Wt 202.0 lb

## 2013-04-03 DIAGNOSIS — E1149 Type 2 diabetes mellitus with other diabetic neurological complication: Secondary | ICD-10-CM | POA: Diagnosis not present

## 2013-04-03 DIAGNOSIS — E119 Type 2 diabetes mellitus without complications: Secondary | ICD-10-CM

## 2013-04-03 DIAGNOSIS — G8929 Other chronic pain: Secondary | ICD-10-CM | POA: Diagnosis not present

## 2013-04-03 DIAGNOSIS — E876 Hypokalemia: Secondary | ICD-10-CM | POA: Diagnosis not present

## 2013-04-03 DIAGNOSIS — K089 Disorder of teeth and supporting structures, unspecified: Secondary | ICD-10-CM | POA: Diagnosis not present

## 2013-04-03 DIAGNOSIS — Z5987 Material hardship due to limited financial resources, not elsewhere classified: Secondary | ICD-10-CM | POA: Diagnosis not present

## 2013-04-03 DIAGNOSIS — E039 Hypothyroidism, unspecified: Secondary | ICD-10-CM | POA: Diagnosis not present

## 2013-04-03 DIAGNOSIS — F32A Depression, unspecified: Secondary | ICD-10-CM

## 2013-04-03 DIAGNOSIS — R300 Dysuria: Secondary | ICD-10-CM | POA: Diagnosis not present

## 2013-04-03 DIAGNOSIS — F329 Major depressive disorder, single episode, unspecified: Secondary | ICD-10-CM

## 2013-04-03 DIAGNOSIS — E785 Hyperlipidemia, unspecified: Secondary | ICD-10-CM | POA: Diagnosis not present

## 2013-04-03 DIAGNOSIS — Z5989 Other problems related to housing and economic circumstances: Secondary | ICD-10-CM | POA: Diagnosis not present

## 2013-04-03 DIAGNOSIS — M549 Dorsalgia, unspecified: Secondary | ICD-10-CM | POA: Diagnosis not present

## 2013-04-03 DIAGNOSIS — R51 Headache: Secondary | ICD-10-CM | POA: Diagnosis not present

## 2013-04-03 DIAGNOSIS — I10 Essential (primary) hypertension: Secondary | ICD-10-CM | POA: Diagnosis not present

## 2013-04-03 DIAGNOSIS — I4891 Unspecified atrial fibrillation: Secondary | ICD-10-CM | POA: Diagnosis not present

## 2013-04-03 DIAGNOSIS — F3289 Other specified depressive episodes: Secondary | ICD-10-CM | POA: Diagnosis not present

## 2013-04-03 LAB — GLUCOSE, CAPILLARY: Glucose-Capillary: 182 mg/dL — ABNORMAL HIGH (ref 70–99)

## 2013-04-03 LAB — POCT GLYCOSYLATED HEMOGLOBIN (HGB A1C): Hemoglobin A1C: 6.6

## 2013-04-03 MED ORDER — AMITRIPTYLINE HCL 100 MG PO TABS: 50.0000 mg | ORAL_TABLET | Freq: Every day | ORAL | Status: DC

## 2013-04-03 NOTE — Assessment & Plan Note (Addendum)
Patient is missing several teeth and has gingival disease as well.  I advised her to continue antibiotics (clindamycin day 7/14) but also to try salt water rinse.  I do not think the ulcer on her top lip is secondary to antibiotics, given that it is only one isolated lesion, and looks like an apthous ulcer. I urged her to contact the dental contacts that we provided to see if she can set up a payment plan with any of them.

## 2013-04-03 NOTE — Telephone Encounter (Signed)
Has blisters in mouth - appt today at [facility] Dr Burnard Bunting. Hilda Blades Yilia Sacca RN 04/03/13 12:10PM

## 2013-04-03 NOTE — Progress Notes (Signed)
Subjective:   Patient ID: [name] female   DOB: 20-Jun-1950 63 y.o.   MRN: [id number]  Chief Complaint  Patient presents with  . Dental Injury    Front crown came off recently and went to ER.  Used Super glue on tooth - noted blisters since going to ER - antibiotics.    HPI: [name] is a 63 y.o. woman with history of hypothyroidism, DM, HLD, Depression, HTN, afib on coumadin, TIA who presents for an acute visit. She was seen in the ED on 03/28/13 with swelling over her right side lips and gums.  She had h/o a broken front tooth, dental carries, crowns and extractions.  She could not return to the dentist, so ahs been gluing her crown on with superglue for the last year. It was felt she had no palpable abscess in the ED.  She was started on clindamycin (PCN allergy) and was referred to a dentist.    I reviewed history with patient - she notes that on the day she went to the ED, top & bottom lips were swollen (went to bed with top lips swollen), called RN triage, told to go to ED for concern of throat swelling. The day before, her top front tooth crown fell off, she tried to glue it back in, but it never stayed. No fevers, chills. Lips hurt, but gums are only mildly tender, able to eat, but has been difficult because missing so many teeth. Primary concern today is ulcers in mouth that got worse after abx, but did notice ulcers before abx. Today is day 7 on abx, was given #42, TID, swelling has improved.   Review of Systems: Constitutional: Denies fever, chills, diaphoresis, appetite change and fatigue.  HEENT: Denies photophobia, eye pain, redness, hearing loss, ear pain, congestion, sore throat, rhinorrhea, sneezing, mouth sores, trouble swallowing, neck pain, neck stiffness and tinnitus.  Respiratory: Denies SOB, DOE, chest tightness, and wheezing. +recent cough that is improving Cardiovascular: Denies chest pain, palpitations and leg swelling.  Gastrointestinal: Denies nausea,  vomiting, abdominal pain, diarrhea, constipation,blood in stool and abdominal distention.  Genitourinary: Denies dysuria, urgency, frequency, hematuria, flank pain and difficulty urinating.  Musculoskeletal: +chronic back pain  Skin: Denies pallor, rash and wound.  Neurological: Denies dizziness, seizures, syncope, weakness, lightheadedness, numbness     Past Medical History  Diagnosis Date  . Depression   . Obesity   . Skin neoplasm   . Bunion   . Carotid stenosis   . Fibromyalgia   . Internal hemorrhoid   . GERD (gastroesophageal reflux disease)   . Chronic gastritis   . Transaminase or LDH elevation   . Mild cognitive impairment   . Hyperlipidemia   . Fatty liver   . Lumbar back pain   . Diabetic peripheral neuropathy   . Dysphagia     no documented strictures but responded positively to dilation in past.   . Hypertension   . Cholecystitis     s/p cholecystectomy  . Sarcoidosis   . Fibromyalgia   . CHF (congestive heart failure)   . Skin cancer 1990's    "front of my right leg"  . Atrial fibrillation   . OSA (obstructive sleep apnea) 2003    "can't sleep w/that darm machine"  . Exertional dyspnea   . Hypothyroidism   . Type II diabetes mellitus   . TIA (transient ischemic attack)     07/29/11 pt denies this history  . H/O hiatal hernia   .  Epileptic seizure, tonic     .No meds since age of 46.  . Osteoarthritis   . Stroke     tia  . Hypothyroid   . Diverticulosis   . Digoxin toxicity     08/2011  . Bradycardia, drug induced     08/2011 (dig)  . Drug-induced hypotension     08/2011 (Dig)   Current Outpatient Prescriptions  Medication Sig Dispense Refill  . ALPRAZolam (XANAX) 0.5 MG tablet Take 0.5 mg by mouth at bedtime as needed. For anxiety/sleep.      Marland Kitchen amitriptyline (ELAVIL) 100 MG tablet Take 0.5 tablets (50 mg total) by mouth at bedtime.  45 tablet  1  . atenolol (TENORMIN) 25 MG tablet Take 1.5 tablets (37.5 mg total) by mouth 2 (two) times daily.   135 tablet  4  . calcium carbonate (OS-CAL) 600 MG TABS Take 600 mg by mouth daily.      . clindamycin (CLEOCIN) 150 MG capsule Take 2 capsules (300 mg total) by mouth 3 (three) times daily.  42 capsule  0  . cyclobenzaprine (FLEXERIL) 5 MG tablet Take 5 mg by mouth 2 (two) times daily as needed. For fibromyalgia      . diltiazem (DILACOR XR) 180 MG 24 hr capsule Take 1 capsule (180 mg total) by mouth daily.  90 capsule  4  . FLUoxetine (PROZAC) 20 MG tablet Take 1 tablet (20 mg total) by mouth daily.  90 tablet  4  . furosemide (LASIX) 20 MG tablet Take 1 tablet (20 mg total) by mouth daily.  90 tablet  4  . gabapentin (NEURONTIN) 100 MG capsule Take 1 capsule (100 mg total) by mouth 3 (three) times daily.  270 capsule  4  . HYDROcodone-acetaminophen (NORCO/VICODIN) 5-325 MG per tablet Take 1 tablet by mouth every 6 (six) hours as needed for moderate pain.  120 tablet  0  . ibuprofen (ADVIL,MOTRIN) 800 MG tablet Take 1 tablet (800 mg total) by mouth every 8 (eight) hours as needed.  90 tablet  0  . ibuprofen (ADVIL,MOTRIN) 800 MG tablet Take 1 tablet (800 mg total) by mouth 3 (three) times daily.  21 tablet  0  . levothyroxine (SYNTHROID, LEVOTHROID) 75 MCG tablet Take 1 tablet (75 mcg total) by mouth every morning.  90 tablet  4  . losartan (COZAAR) 25 MG tablet Take 0.5 tablets (12.5 mg total) by mouth daily.  90 tablet  1  . metFORMIN (GLUCOPHAGE) 500 MG tablet Take 1 tablet (500 mg total) by mouth daily with breakfast.  30 tablet  5  . omeprazole (PRILOSEC) 20 MG capsule Take 1 capsule (20 mg total) by mouth daily.  90 capsule  4  . potassium chloride (K-DUR) 10 MEQ tablet Take 1 tablet (10 mEq total) by mouth daily.  90 tablet  4  . pravastatin (PRAVACHOL) 40 MG tablet Take 1 tablet (40 mg total) by mouth daily.  90 tablet  4  . warfarin (COUMADIN) 5 MG tablet Take 2.5-5 mg by mouth daily. Take 5mg  on Sunday and Thursday. All other days take 2.5mg .       No current facility-administered  medications for this visit.   Family History  Problem Relation Age of Onset  . Crohn's disease Mother   . Colitis Mother     Crohns  . Anesthesia problems Mother   . Arthritis Mother     rheumatoid; maternal side of family also with RA  . Cancer Father     skin  .  Diabetes Father   . Heart disease Father   . Melanoma Father   . Coronary artery disease Father   . Diabetes Sister   . Depression Maternal Uncle   . Dementia Maternal Uncle   . Hypotension Neg Hx   . Malignant hyperthermia Neg Hx   . Pseudochol deficiency Neg Hx   . Colon cancer Paternal Uncle     ? if stomach or colon    History   Social History  . Marital Status: Divorced    Spouse Name: N/A    Number of Children: 2  . Years of Education: 12th    Occupational History  . cosmotologist    Social History Main Topics  . Smoking status: Never Smoker   . Smokeless tobacco: Never Used  . Alcohol Use: No  . Drug Use: No  . Sexual Activity: Not Currently   Other Topics Concern  . None   Social History Narrative   Divorced, on disability, has 2 kids (son in [place], daughter in [place]), owns a house in [place], [place] and has a female roommate    No caffeine     Objective:  Physical Exam: Filed Vitals:   04/03/13 1410  BP: 139/88  Pulse: 100  Temp: 98.6 F (37 C)  TempSrc: Oral  Height: 5\' 3"  (1.6 m)  Weight: 202 lb (91.627 kg)  SpO2: 95%   HEENT: PERRL, EOMI, no scleral icterus, poor dentition (missing teeth on top right and bottom left, top right front tooth), sore/tender throughout but not over a pinpoint location, <1cm white based ulcer on middle of top lip, right cervical chain LAD, mildly erythematous upper and lower gingiva that feel sore to palpation Cardiac: RRR, no rubs, murmurs or gallops Pulm: clear to auscultation bilaterally, moving normal volumes of air Abd: soft, nontender, nondistended, BS present Ext: warm and well perfused, no pedal edema Neuro: alert and oriented X3, cranial nerves  II-XII grossly intact  Assessment & Plan:  Case and care discussed with Dr. Daryll Drown.  Please see problem oriented charting for further details. Patient to return in 2 weeks for routine visit with PCP.

## 2013-04-03 NOTE — Assessment & Plan Note (Signed)
Lab Results  Component Value Date   HGBA1C 6.6 04/03/2013   HGBA1C 6.1 01/12/2013   HGBA1C 6.0 07/25/2012     Assessment: Diabetes control:  at goal Progress toward A1C goal:   at goal  Plan: Medications:  continue current medications - metformin 500 daily Instruction/counseling given: no instruction/counseling  Educational resources provided: handout;brochure

## 2013-04-03 NOTE — Patient Instructions (Signed)
-  Please be sure to find a dentist as soon as possible -Finish off your course of antibiotics, if you feel your ulcer worsens or you break out in a rash, please let us know; I think you may have had a virus and that is what is causing the ulcer on your lip -Regarding your back pain and other chronic medical issues, please make an appointment with your primary care physician  Please be sure to bring all of your medications with you to every visit.  Should you have any new or worsening symptoms, please be sure to call the clinic at [phone].

## 2013-04-05 NOTE — Progress Notes (Signed)
Case discussed with Dr. Sharda soon after the resident saw the patient.  We reviewed the resident's history and exam and pertinent patient test results.  I agree with the assessment, diagnosis, and plan of care documented in the resident's note. 

## 2013-04-20 ENCOUNTER — Ambulatory Visit (INDEPENDENT_AMBULATORY_CARE_PROVIDER_SITE_OTHER): Payer: Medicare Other | Admitting: Internal Medicine

## 2013-04-20 ENCOUNTER — Encounter: Payer: Self-pay | Admitting: Internal Medicine

## 2013-04-20 VITALS — BP 125/74 | HR 89 | Temp 97.5°F | Ht 63.0 in | Wt 204.5 lb

## 2013-04-20 DIAGNOSIS — G8929 Other chronic pain: Secondary | ICD-10-CM

## 2013-04-20 DIAGNOSIS — I4891 Unspecified atrial fibrillation: Secondary | ICD-10-CM

## 2013-04-20 DIAGNOSIS — I10 Essential (primary) hypertension: Secondary | ICD-10-CM

## 2013-04-20 DIAGNOSIS — M549 Dorsalgia, unspecified: Secondary | ICD-10-CM | POA: Diagnosis not present

## 2013-04-20 DIAGNOSIS — R42 Dizziness and giddiness: Secondary | ICD-10-CM

## 2013-04-20 DIAGNOSIS — R109 Unspecified abdominal pain: Secondary | ICD-10-CM

## 2013-04-20 DIAGNOSIS — K089 Disorder of teeth and supporting structures, unspecified: Secondary | ICD-10-CM

## 2013-04-20 DIAGNOSIS — K219 Gastro-esophageal reflux disease without esophagitis: Secondary | ICD-10-CM

## 2013-04-20 DIAGNOSIS — E785 Hyperlipidemia, unspecified: Secondary | ICD-10-CM | POA: Diagnosis not present

## 2013-04-20 DIAGNOSIS — E119 Type 2 diabetes mellitus without complications: Secondary | ICD-10-CM

## 2013-04-20 LAB — GLUCOSE, CAPILLARY: Glucose-Capillary: 141 mg/dL — ABNORMAL HIGH (ref 70–99)

## 2013-04-20 MED ORDER — CYCLOBENZAPRINE HCL 5 MG PO TABS: 5.0000 mg | ORAL_TABLET | Freq: Two times a day (BID) | ORAL | Status: DC | PRN

## 2013-04-20 MED ORDER — HYDROCODONE-ACETAMINOPHEN 5-325 MG PO TABS: 1.0000 | ORAL_TABLET | Freq: Four times a day (QID) | ORAL | Status: DC | PRN

## 2013-04-20 NOTE — Assessment & Plan Note (Addendum)
BP Readings from Last 3 Encounters:  04/20/13 125/74  04/03/13 139/88  03/28/13 122/83    Lab Results  Component Value Date   NA 140 01/12/2013   K 4.4 01/12/2013   CREATININE 0.82 01/12/2013    Assessment: Blood pressure control: controlled Progress toward BP goal:  at goal Comments: none  Plan: Medications:  continue current medications Educational resources provided: other (see comments);brochure Self management tools provided: other (see comments) Other plans: f/u in 3-4 months

## 2013-04-20 NOTE — Assessment & Plan Note (Addendum)
Lab Results  Component Value Date   HGBA1C 6.6 04/03/2013   HGBA1C 6.1 01/12/2013   HGBA1C 6.0 07/25/2012     Assessment: Diabetes control: good control (HgbA1C at goal) Progress toward A1C goal:  at goal Comments: none  Plan: Medications:  continue current medications Home glucose monitoring: Frequency: no home glucose monitoring Timing: N/A Instruction/counseling given: reminded to bring medications to each visit and provided printed educational material Educational resources provided: other (see comments) Self management tools provided: other (see comments) Other plans: f/u in 3-4 months, pt to call and schedule eye MD appt, foot exam done today.  She also needs to f/u with dental for poor dentition.

## 2013-04-20 NOTE — Assessment & Plan Note (Signed)
INR at goal follows with cardiology for INR

## 2013-04-20 NOTE — Assessment & Plan Note (Addendum)
Recently completed Clindamycin x 14 days for oral blisters d/t rxn to super glue.  She has been gluing her crown in for 2-3 years  Needs to follow with dental>made referral today

## 2013-04-20 NOTE — Progress Notes (Signed)
Subjective:    Patient ID: [name], female    DOB: March 16, 1950, 63 y.o.   MRN: [id number]  HPI Comments: 63 y.o female PMH depression, obesity, skin neoplasm (right leg), carotid stenosis, Fibromyalgia, Internal hemorrhoid, GERD (gastroesophageal reflux disease), Chronic gastritis, Transaminase or LDH elevation, Hyperlipidemia (LDL 58 01/2013), Fatty liver, Chronic pain (back pain), chronic h/a, chronic abdominal pain, diverticulosis (follow with Dr. Sharlett Iles GI), Diabetic peripheral neuropathy, Dysphagia with h/o strictures and dilation, Hypertension (controlled), Cholecystitis s/p cholecystectomy, Sarcoidosis, CHF (congestive heart failure), Atrial fibrillation on Coumadin (INR followed by cardiology), OSA (obstructive sleep apnea) w/o cpap, Exertional dyspnea, Hypothyroidism, Type II diabetes mellitus (HA1C 6.6 03/2013 cbg today 141), H/O hiatal hernia, h/o ventral hernia with complex repair, epileptic seizure (as a child), Osteoarthritis, diverticulosis, digoxin drug-induced hypotension and bradycardia, borderline osteopenia 2010.    1. She presents for follow up.  Recently seen in late 03/2013 for possible allergic reaction as she was super gluing her crown in her mouth for 2-3 years and she developed oral blisters.  She was placed on Clindamycin x 14 days with rec. To see a dentist but has not d/t finances.    2. Chronic pain (h/a, back, abdomen)-discussed long acting pain medication vs short acting vs Lidoderm patch.  Back pain is from shoulders to mid back 10/10 worse with movement and she is packing to move from apt to house.  She denies wanting to try long acting medication now and wants to stick with Norco d/t finances. Ibuprofen did not help. Consider repeat MRI back. She c/o LLQ abdominal pain is is worried she may be getting another hernia.     3. She needs medication refills other than narcotics not sure which ones will call her pharmacy.  Called pharmacy which stated pt needed rx  refill of narcotic and Coumadin but Coumadin is filled by Cardiology.    4. She states she is lightheaded and sweating today.  Orthostatics neg today lying BP 112/70 (86), 114/71 (80), 125/74 (89).  TSH most recent wnl.  cbg was 141.      HM: flu vaccine up to date, will need repeat DEXA 07/2013, colonoscopy Dr. Sharlett Iles 10/2008 with moderate diverticulosis throughout. Due for eye check up and will call, foot exam today   SH: living in her house now out of the apt. Daughter flying her up in 07/2013 to see grandkids who she has not seen in a while.        Review of Systems  Constitutional: Positive for diaphoresis. Negative for fever and chills.  Cardiovascular: Negative for chest pain.  Gastrointestinal: Positive for abdominal pain. Negative for nausea and vomiting.       Reports LLQ ab pain   Genitourinary: Negative for dysuria.  Neurological: Positive for light-headedness.       Objective:   Physical Exam  Nursing note and vitals reviewed. Constitutional: She is oriented to person, place, and time. Vital signs are normal. She appears well-developed and well-nourished. She is cooperative. No distress.  HENT:  Head: Normocephalic and atraumatic.  Mouth/Throat: Oropharynx is clear and moist and mucous membranes are normal. Abnormal dentition. No oropharyngeal exudate.  Missing multiple teeth including right front tooth and left bottom teeth. Decay present   Eyes: Conjunctivae are normal. Pupils are equal, round, and reactive to light. Right eye exhibits no discharge. Left eye exhibits no discharge. No scleral icterus.  Cardiovascular: Normal rate, S1 normal, S2 normal and normal heart sounds.  An irregular rhythm present.  No murmur  heard. No lower extremity edema   Pulmonary/Chest: Effort normal and breath sounds normal. No respiratory distress. She has no wheezes.  Abdominal: Soft. Bowel sounds are normal. There is no tenderness.  Obese No ttp LLQ no evidence of hernia LLQ    Musculoskeletal:       Arms: Neurological: She is alert and oriented to person, place, and time. Gait normal.  Skin: Skin is warm, dry and intact. No rash noted. She is not diaphoretic.  Xerosis   Psychiatric: She has a normal mood and affect. Her speech is normal and behavior is normal. Judgment and thought content normal. Cognition and memory are normal.          Assessment & Plan:  F/u in 3-4 months, sooner if needed At f/u schedule DEXA 07/2013

## 2013-04-20 NOTE — Assessment & Plan Note (Signed)
Lipid Panel     Component Value Date/Time   CHOL 172 01/12/2013 1454   TRIG 372* 01/12/2013 1454   HDL 40 01/12/2013 1454   CHOLHDL 4.3 01/12/2013 1454   VLDL 74* 01/12/2013 1454   LDLCALC 58 01/12/2013 1454   LDL at goal. Continue statin

## 2013-04-20 NOTE — Patient Instructions (Addendum)
General Instructions: Follow up in 3-4 months, sooner if needed  Read all the information below  See a dentist.  I placed a referral call and schedule We will need to get a bone scan for osteoporosis 07/2013  Take Flexeril for back pain. We may image your back in the future with MRI Try over the counter Zegerid  Take care. Happy Valentines Day!   Treatment Goals:  Goals (1 Years of Data) as of 04/20/13         04/20/13 04/20/13 04/20/13 04/20/13 04/03/13     Blood Pressure    . Blood Pressure < 140/90  125/74 114/71 112/70 133/71 139/88     Result Component    . HEMOGLOBIN A1C < 7.0      6.6      Progress Toward Treatment Goals:  Treatment Goal 04/20/2013  Hemoglobin A1C at goal  Blood pressure at goal  Prevent falls -    Self Care Goals & Plans:  Self Care Goal 04/20/2013  Manage my medications take my medicines as prescribed; bring my medications to every visit; refill my medications on time  Monitor my health keep track of my blood glucose; bring my glucose meter and log to each visit; keep track of my blood pressure; bring my blood pressure log to each visit; keep track of my weight; check my feet daily  Eat healthy foods drink diet soda or water instead of juice or soda; eat more vegetables; eat foods that are low in salt; eat baked foods instead of fried foods; eat fruit for snacks and desserts; eat smaller portions  Be physically active find an activity I enjoy  Prevent falls -  Meeting treatment goals maintain the current self-care plan    Home Blood Glucose Monitoring 04/20/2013  Check my blood sugar no home glucose monitoring  When to check my blood sugar N/A     Care Management & Community Referrals:  Referral 04/20/2013  Referrals made for care management support none needed  Referrals made to community resources none    Hypertension As your heart beats, it forces blood through your arteries. This force is your blood pressure. If the pressure is too high, it is  called hypertension (HTN) or high blood pressure. HTN is dangerous because you may have it and not know it. High blood pressure may mean that your heart has to work harder to pump blood. Your arteries may be narrow or stiff. The extra work puts you at risk for heart disease, stroke, and other problems.  Blood pressure consists of two numbers, a higher number over a lower, 110/72, for example. It is stated as "110 over 72." The ideal is below 120 for the top number (systolic) and under 80 for the bottom (diastolic). Write down your blood pressure today. You should pay close attention to your blood pressure if you have certain conditions such as:  Heart failure.  Prior heart attack.  Diabetes  Chronic kidney disease.  Prior stroke.  Multiple risk factors for heart disease. To see if you have HTN, your blood pressure should be measured while you are seated with your arm held at the level of the heart. It should be measured at least twice. A one-time elevated blood pressure reading (especially in the Emergency Department) does not mean that you need treatment. There may be conditions in which the blood pressure is different between your right and left arms. It is important to see your caregiver soon for a recheck. Most people have  essential hypertension which means that there is not a specific cause. This type of high blood pressure may be lowered by changing lifestyle factors such as:  Stress.  Smoking.  Lack of exercise.  Excessive weight.  Drug/tobacco/alcohol use.  Eating less salt. Most people do not have symptoms from high blood pressure until it has caused damage to the body. Effective treatment can often prevent, delay or reduce that damage. TREATMENT  When a cause has been identified, treatment for high blood pressure is directed at the cause. There are a large number of medications to treat HTN. These fall into several categories, and your caregiver will help you select the  medicines that are best for you. Medications may have side effects. You should review side effects with your caregiver. If your blood pressure stays high after you have made lifestyle changes or started on medicines,   Your medication(s) may need to be changed.  Other problems may need to be addressed.  Be certain you understand your prescriptions, and know how and when to take your medicine.  Be sure to follow up with your caregiver within the time frame advised (usually within two weeks) to have your blood pressure rechecked and to review your medications.  If you are taking more than one medicine to lower your blood pressure, make sure you know how and at what times they should be taken. Taking two medicines at the same time can result in blood pressure that is too low. SEEK IMMEDIATE MEDICAL CARE IF:  You develop a severe headache, blurred or changing vision, or confusion.  You have unusual weakness or numbness, or a faint feeling.  You have severe chest or abdominal pain, vomiting, or breathing problems. MAKE SURE YOU:   Understand these instructions.  Will watch your condition.  Will get help right away if you are not doing well or get worse. Document Released: 02/23/2005 Document Revised: 05/18/2011 Document Reviewed: 10/14/2007 [facility] Patient Information 2014 [place].  Diabetes and Exercise Exercising regularly is important. It is not just about losing weight. It has many health benefits, such as:  Improving your overall fitness, flexibility, and endurance.  Increasing your bone density.  Helping with weight control.  Decreasing your body fat.  Increasing your muscle strength.  Reducing stress and tension.  Improving your overall health. People with diabetes who exercise gain additional benefits because exercise:  Reduces appetite.  Improves the body's use of blood sugar (glucose).  Helps lower or control blood glucose.  Decreases blood  pressure.  Helps control blood lipids (such as cholesterol and triglycerides).  Improves the body's use of the hormone insulin by:  Increasing the body's insulin sensitivity.  Reducing the body's insulin needs.  Decreases the risk for heart disease because exercising:  Lowers cholesterol and triglycerides levels.  Increases the levels of good cholesterol (such as high-density lipoproteins [HDL]) in the body.  Lowers blood glucose levels. YOUR ACTIVITY PLAN  Choose an activity that you enjoy and set realistic goals. Your health care provider or diabetes educator can help you make an activity plan that works for you. You can break activities into 2 or 3 sessions throughout the day. Doing so is as good as one long session. Exercise ideas include:  Taking the dog for a walk.  Taking the stairs instead of the elevator.  Dancing to your favorite song.  Doing your favorite exercise with a friend. RECOMMENDATIONS FOR EXERCISING WITH TYPE 1 OR TYPE 2 DIABETES   Check your blood glucose before  exercising. If blood glucose levels are greater than 240 mg/dL, check for urine ketones. Do not exercise if ketones are present.  Avoid injecting insulin into areas of the body that are going to be exercised. For example, avoid injecting insulin into:  The arms when playing tennis.  The legs when jogging.  Keep a record of:  Food intake before and after you exercise.  Expected peak times of insulin action.  Blood glucose levels before and after you exercise.  The type and amount of exercise you have done.  Review your records with your health care provider. Your health care provider will help you to develop guidelines for adjusting food intake and insulin amounts before and after exercising.  If you take insulin or oral hypoglycemic agents, watch for signs and symptoms of hypoglycemia. They include:  Dizziness.  Shaking.  Sweating.  Chills.  Confusion.  Drink plenty of water  while you exercise to prevent dehydration or heat stroke. Body water is lost during exercise and must be replaced.  Talk to your health care provider before starting an exercise program to make sure it is safe for you. Remember, almost any type of activity is better than none. Document Released: 05/16/2003 Document Revised: 10/26/2012 Document Reviewed: 08/02/2012 [facility] Patient Information 2014 [place].   Type 2 Diabetes Mellitus, Adult Type 2 diabetes mellitus is a long-term (chronic) disease. In type 2 diabetes:  The pancreas does not make enough of a hormone called insulin.  The cells in the body do not respond as well to the insulin that is made.  Both of the above can happen. Normally, insulin moves sugars from food into tissue cells. This gives you energy. If you have type 2 diabetes, sugars cannot be moved into tissue cells. This causes high blood sugar (hyperglycemia).  HOME CARE  Have your hemoglobin A1c level checked twice a year. The level shows if your diabetes is under control or out of control.  Perform daily blood sugar testing as told by your doctor.  Check your ketone levels by testing your pee (urine) when you are sick and as told.  Take your diabetes or insulin medicine as told by your doctor.  Never run out of insulin.  Adjust how much insulin you give yourself based on how many carbs (carbohydrates) you eat. Carbs are in many foods, such as fruits, vegetables, whole grains, and dairy products.  Have a healthy snack between every healthy meal. Have 3 meals and 3 snacks a day.  Lose weight if you are overweight.  Carry a medical alert card or wear your medical alert jewelry.  Carry a 15 gram carb snack with you at all times. Examples include:  Glucose pills, 3 or 4.  Glucose gel, 15 gram tube.  Raisins, 2 tablespoons (24 grams).  Jelly beans, 6.  Animal crackers, 8.  Sugar pop, 4 ounces (120 milliliters).  Gummy treats, 9.  Notice low  blood sugar (hypoglycemia) symptoms, such as:  Shaking (tremors).  Decreased ability to think clearly.  Sweating.  Increased heart rate.  Headache.  Dry mouth.  Hunger.  Crabbiness (irritability).  Being worried or tense (anxiety).  Restless sleep.  A change in speech or coordination.  Confusion.  Treat low blood sugar right away. If you are alert and can swallow, follow the 15:15 rule:  Take 15 20 grams of a rapid-acting glucose or carb. This includes glucose gel, glucose pills, or 4 ounces (120 milliliters) of fruit juice, regular pop, or low-fat milk.  Check  your blood sugar level after taking the glucose.  Take 15 20 grams of more glucose if the repeat blood sugar level is still 70 mg/dL (milligrams/deciliter) or below.  Eat a meal or snack within 1 hour of the blood sugar levels going back to normal.  Notice early symptoms of high blood sugar, such as:  Being really thirsty or drinking a lot (polydipsia).  Peeing (urinating) a lot (polyuria).  Do at least 150 minutes of physical activity a week or as told.  Split the 150 minutes of activity up during the week. Do not do 150 minutes of activity in one day.  Perform exercises, such as weight lifting, at least 2 times a week or as told.  Adjust your insulin or food intake as needed if you start a new exercise or sport.  Follow your sick day plan when you are not able to eat or drink as usual.  Avoid tobacco use.  Women who are not pregnant should drink no more than 1 drink a day. Men should drink no more than 2 drinks a day.  Only drink alcohol with food.  Ask your doctor if alcohol is safe for you.  Tell your doctor if you drink alcohol several times during the week.  See your doctor regularly.  Schedule an eye exam soon after you are diagnosed with diabetes. Schedule exams once every year.  Check your skin and feet every day. Check for cuts, bruises, redness, nail problems, bleeding, blisters, or  sores. A doctor should do a foot exam once a year.  Brush your teeth and gums twice a day. Floss once a day. Visit your dentist regularly.  Share your diabetes plan with your workplace or school.  Stay up-to-date with shots that fight against diseases (immunizations).  Learn how to manage stress.  Get diabetes education and support as needed.  Ask your doctor for special help if:  You need help to maintain or improve how you to do things on your own.  You need help to maintain or improve the quality of your life.  You have foot or hand problems.  You have trouble cleaning yourself, dressing, eating, or doing physical activity. GET HELP RIGHT AWAY IF:  You have trouble breathing.  You have moderate to large ketone levels.  You are unable to eat food or drink fluids for more than 6 hours.  You feel sick to your stomach (nauseous) or throw up (vomit) for more than 6 hours.  Your blood sugar level is over 240 mg/dL.  There is a change in mental status.  You get another serious illness.  You have watery poop (diarrhea) for more than 6 hours.  You have been sick or have had a fever for 2 or more days and are not getting better.  You have pain when you are physically active. MAKE SURE YOU:  Understand these instructions.  Will watch your condition.  Will get help right away if you are not doing well or get worse. Document Released: 12/03/2007 Document Revised: 12/14/2012 Document Reviewed: 06/24/2012 [facility] Patient Information 2014 [place], [place].  Dental Caries  Dental caries (also called tooth decay) is the most common oral disease. It can occur at any age, but is more common in children and young adults.  HOW DENTAL CARIES DEVELOPS  The process of decay begins when bacteria and foods (particularly sugars and starches) combine in your mouth to produce plaque. Plaque is a substance that sticks to the hard, outer surface of a tooth (  enamel). The bacteria in plaque  produce acids that attack enamel. These acids may also attack the root surface of a tooth (cementum) if it is exposed. Repeated attacks dissolve these surfaces and create holes in the tooth (cavities). If left untreated, the acids destroy the other layers of the tooth.  RISK FACTORS  Frequent sipping of sugary beverages.   Frequent snacking on sugary and starchy foods, especially those that easily get stuck in the teeth.   Poor oral hygiene.   Dry mouth.   Substance abuse such as methamphetamine abuse.   Broken or poor-fitting dental restorations.   Eating disorders.   Gastroesophageal reflux disease (GERD).   Certain radiation treatments to the head and neck. SYMPTOMS In the early stages of dental caries, symptoms are seldom present. Sometimes white, chalky areas may be seen on the enamel or other tooth layers. In later stages, symptoms may include:  Pits and holes on the enamel.  Toothache after sweet, hot, or cold foods or drinks are consumed.  Pain around the tooth.  Swelling around the tooth. DIAGNOSIS  Most of the time, dental caries is detected during a regular dental checkup. A diagnosis is made after a thorough medical and dental history is taken and the surfaces of your teeth are checked for signs of dental caries. Sometimes special instruments, such as lasers, are used to check for dental caries. Dental X-ray exams may be taken so that areas not visible to the eye (such as between the contact areas of the teeth) can be checked for cavities.  TREATMENT  If dental caries is in its early stages, it may be reversed with a fluoride treatment or an application of a remineralizing agent at the dental office. Thorough brushing and flossing at home is needed to aid these treatments. If it is in its later stages, treatment depends on the location and extent of tooth destruction:   If a small area of the tooth has been destroyed, the destroyed area will be removed and  cavities will be filled with a material such as gold, silver amalgam, or composite resin.   If a large area of the tooth has been destroyed, the destroyed area will be removed and a cap (crown) will be fitted over the remaining tooth structure.   If the center part of the tooth (pulp) is affected, a procedure called a root canal will be needed before a filling or crown can be placed.   If most of the tooth has been destroyed, the tooth may need to be pulled (extracted). HOME CARE INSTRUCTIONS You can prevent, stop, or reverse dental caries at home by practicing good oral hygiene. Good oral hygiene includes:  Thoroughly cleaning your teeth at least twice a day with a toothbrush and dental floss.   Using a fluoride toothpaste. A fluoride mouth rinse may also be used if recommended by your dentist or health care provider.   Restricting the amount of sugary and starchy foods and sugary liquids you consume.   Avoiding frequent snacking on these foods and sipping of these liquids.   Keeping regular visits with a dentist for checkups and cleanings. PREVENTION   Practice good oral hygiene.  Consider a dental sealant. A dental sealant is a coating material that is applied by your dentist to the pits and grooves of teeth. The sealant prevents food from being trapped in them. It may protect the teeth for several years.  Ask about fluoride supplements if you live in a community without fluorinated water  or with water that has a low fluoride content. Use fluoride supplements as directed by your dentist or health care provider.  Allow fluoride varnish applications to teeth if directed by your dentist or health care provider. Document Released: 11/15/2001 Document Revised: 10/26/2012 Document Reviewed: 02/26/2012 [facility] Patient Information 2014 [place].  Drug Allergy Allergic reactions to medicines are common. Some allergic reactions are mild. A delayed type of drug allergy that  occurs 1 week or more after exposure to a medicine or vaccine is called serum sickness. A life-threatening, sudden (acute) allergic reaction that involves the whole body is called anaphylaxis. CAUSES  "True" drug allergies occur when there is an allergic reaction to a medicine. This is caused by overactivity of the immune system. First, the body becomes sensitized. The immune system is triggered by your first exposure to the medicine. Following this first exposure, future exposure to the same medicine may be life-threatening. Almost any medicine can cause an allergic reaction. Common ones are:  Penicillin.  Sulfonamides (sulfa drugs).  Local anesthetics.  X-ray dyes that contain iodine. SYMPTOMS  Common symptoms of a minor allergic reaction are:  Swelling around the mouth.  An itchy red rash or hives.  Vomiting or diarrhea. Anaphylaxis can cause swelling of the mouth and throat. This makes it difficult to breathe and swallow. Severe reactions can be fatal within seconds, even after exposure to only a trace amount of the drug that causes the reaction. HOME CARE INSTRUCTIONS   If you are unsure of what caused your reaction, keep a diary of foods and medicines used. Include the symptoms that followed. Avoid anything that causes reactions.  You may want to follow up with an allergy specialist after the reaction has cleared in order to be tested to confirm the allergy. It is important to confirm that your reaction is an allergy, not just a side effect to the medicine. If you have a true allergy to a medicine, this may prevent that medicine and related medicines from being given to you when you are very ill.  If you have hives or a rash:  Take medicines as directed by your caregiver.  You may use an over-the-counter antihistamine (diphenhydramine) as needed.  Apply cold compresses to the skin or take baths in cool water. Avoid hot baths or showers.  If you are severely  allergic:  Continuous observation after a severe reaction may be needed. Hospitalization is often required.  Wear a medical alert bracelet or necklace stating your allergy.  You and your family must learn how to use an anaphylaxis kit or give an epinephrine injection to temporarily treat an emergency allergic reaction. If you have had a severe reaction, always carry your epinephrine injection or anaphylaxis kit with you. This can be lifesaving if you have a severe reaction.  Do not drive or perform tasks after treatment until the medicines used to treat your reaction have worn off, or until your caregiver says it is okay. SEEK MEDICAL CARE IF:   You think you had an allergic reaction. Symptoms usually start within 30 minutes after exposure.  Symptoms are getting worse rather than better.  You develop new symptoms.  The symptoms that brought you to your caregiver return. SEEK IMMEDIATE MEDICAL CARE IF:   You have swelling of the mouth, difficulty breathing, or wheezing.  You have a tight feeling in your chest or throat.  You develop hives, swelling, or itching all over your body.  You develop severe vomiting or diarrhea.  You  feel faint or pass out. This is an emergency. Use your epinephrine injection or anaphylaxis kit as you have been instructed. Call for emergency medical help. Even if you improve after the injection, you need to be examined at a hospital emergency department. MAKE SURE YOU:   Understand these instructions.  Will watch your condition.  Will get help right away if you are not doing well or get worse. Document Released: 02/23/2005 Document Revised: 05/18/2011 Document Reviewed: 07/30/2010 [facility] Patient Information 2014 [place], [place].  Anaphylactic Reaction An anaphylactic reaction is a sudden, severe allergic reaction that involves the whole body. It can be life threatening. A hospital stay is often required. People with asthma, eczema, or hay fever are  slightly more likely to have an anaphylactic reaction. CAUSES  An anaphylactic reaction may be caused by anything to which you are allergic. After being exposed to the allergic substance, your immune system becomes sensitized to it. When you are exposed to that allergic substance again, an allergic reaction can occur. Common causes of an anaphylactic reaction include:  Medicines.  Foods, especially peanuts, wheat, shellfish, milk, and eggs.  Insect bites or stings.  Blood products.  Chemicals, such as dyes, latex, and contrast material used for imaging tests. SYMPTOMS  When an allergic reaction occurs, the body releases histamine and other substances. These substances cause symptoms such as tightening of the airway. Symptoms often develop within seconds or minutes of exposure. Symptoms may include:  Skin rash or hives.  Itching.  Chest tightness.  Swelling of the eyes, tongue, or lips.  Trouble breathing or swallowing.  Lightheadedness or fainting.  Anxiety or confusion.  Stomach pains, vomiting, or diarrhea.  Nasal congestion.  A fast or irregular heartbeat (palpitations). DIAGNOSIS  Diagnosis is based on your history of recent exposure to allergic substances, your symptoms, and a physical exam. Your caregiver may also perform blood or urine tests to confirm the diagnosis. TREATMENT  Epinephrine medicine is the main treatment for an anaphylactic reaction. Other medicines that may be used for treatment include antihistamines, steroids, and albuterol. In severe cases, fluids and medicine to support blood pressure may be given through an intravenous line (IV). Even if you improve after treatment, you need to be observed to make sure your condition does not get worse. This may require a stay in the hospital. [place] a medical alert bracelet or necklace stating your allergy.  You and your family must learn how to use an anaphylaxis kit or give an  epinephrine injection to temporarily treat an emergency allergic reaction. Always carry your epinephrine injection or anaphylaxis kit with you. This can be lifesaving if you have a severe reaction.  Do not drive or perform tasks after treatment until the medicines used to treat your reaction have worn off, or until your caregiver says it is okay.  If you have hives or a rash:  Take medicines as directed by your caregiver.  You may use an over-the-counter antihistamine (diphenhydramine) as needed.  Apply cold compresses to the skin or take baths in cool water. Avoid hot baths or showers. SEEK MEDICAL CARE IF:   You develop symptoms of an allergic reaction to a new substance. Symptoms may start right away or minutes later.  You develop a rash, hives, or itching.  You develop new symptoms. SEEK IMMEDIATE MEDICAL CARE IF:   You have swelling of the mouth, difficulty breathing, or wheezing.  You have a tight feeling in your chest or throat.  You develop hives, swelling, or itching all over your body.  You develop severe vomiting or diarrhea.  You feel faint or pass out. This is an emergency. Use your epinephrine injection or anaphylaxis kit as you have been instructed. Call your local emergency services (911 in [place] Even if you improve after the injection, you need to be examined at a hospital emergency department. MAKE SURE YOU:   Understand these instructions.  Will watch your condition.  Will get help right away if you are not doing well or get worse. Document Released: 02/23/2005 Document Revised: 08/25/2011 Document Reviewed: 05/27/2011 [facility] Patient Information 2014 [place], [place].   Diet for Gastroesophageal Reflux Disease, Adult Reflux (acid reflux) is when acid from your stomach flows up into the esophagus. When acid comes in contact with the esophagus, the acid causes irritation and soreness (inflammation) in the esophagus. When reflux happens often or so severely  that it causes damage to the esophagus, it is called gastroesophageal reflux disease (GERD). Nutrition therapy can help ease the discomfort of GERD. FOODS OR DRINKS TO AVOID OR LIMIT  Smoking or chewing tobacco. Nicotine is one of the most potent stimulants to acid production in the gastrointestinal tract.  Caffeinated and decaffeinated coffee and black tea.  Regular or low-calorie carbonated beverages or energy drinks (caffeine-free carbonated beverages are allowed).   Strong spices, such as black pepper, white pepper, red pepper, cayenne, curry powder, and chili powder.  Peppermint or spearmint.  Chocolate.  High-fat foods, including meats and fried foods. Extra added fats including oils, butter, salad dressings, and nuts. Limit these to less than 8 tsp per day.  Fruits and vegetables if they are not tolerated, such as citrus fruits or tomatoes.  Alcohol.  Any food that seems to aggravate your condition. If you have questions regarding your diet, call your caregiver or a registered dietitian. OTHER THINGS THAT MAY HELP GERD INCLUDE:   Eating your meals slowly, in a relaxed setting.  Eating 5 to 6 small meals per day instead of 3 large meals.  Eliminating food for a period of time if it causes distress.  Not lying down until 3 hours after eating a meal.  Keeping the head of your bed raised 6 to 9 inches (15 to 23 cm) by using a foam wedge or blocks under the legs of the bed. Lying flat may make symptoms worse.  Being physically active. Weight loss may be helpful in reducing reflux in overweight or obese adults.  Wear loose fitting clothing EXAMPLE MEAL PLAN This meal plan is approximately 2,000 calories based on [email or web address] meal planning guidelines. Breakfast   cup cooked oatmeal.  1 cup strawberries.  1 cup low-fat milk.  1 oz almonds. Snack  1 cup cucumber slices.  6 oz yogurt (made from low-fat or fat-free milk). Lunch  2 slice whole-wheat  bread.  2 oz sliced [place].  2 tsp mayonnaise.  1 cup blueberries.  1 cup snap peas. Snack  6 whole-wheat crackers.  1 oz string cheese. Dinner   cup brown rice.  1 cup mixed veggies.  1 tsp olive oil.  3 oz grilled fish. Document Released: 02/23/2005 Document Revised: 05/18/2011 Document Reviewed: 01/09/2011 [facility] Patient Information 2014 [place], [place].   Gastroesophageal Reflux Disease, Adult Gastroesophageal reflux disease (GERD) happens when acid from your stomach flows up into the esophagus. When acid comes in contact with the esophagus, the acid causes soreness (inflammation) in the esophagus. Over time, GERD may create small holes (ulcers) in the lining  of the esophagus. CAUSES   Increased body weight. This puts pressure on the stomach, making acid rise from the stomach into the esophagus.  Smoking. This increases acid production in the stomach.  Drinking alcohol. This causes decreased pressure in the lower esophageal sphincter (valve or ring of muscle between the esophagus and stomach), allowing acid from the stomach into the esophagus.  Late evening meals and a full stomach. This increases pressure and acid production in the stomach.  A malformed lower esophageal sphincter. Sometimes, no cause is found. SYMPTOMS   Burning pain in the lower part of the mid-chest behind the breastbone and in the mid-stomach area. This may occur twice a week or more often.  Trouble swallowing.  Sore throat.  Dry cough.  Asthma-like symptoms including chest tightness, shortness of breath, or wheezing. DIAGNOSIS  Your caregiver may be able to diagnose GERD based on your symptoms. In some cases, X-rays and other tests may be done to check for complications or to check the condition of your stomach and esophagus. TREATMENT  Your caregiver may recommend over-the-counter or prescription medicines to help decrease acid production. Ask your caregiver before starting or  adding any new medicines.  HOME CARE INSTRUCTIONS   Change the factors that you can control. Ask your caregiver for guidance concerning weight loss, quitting smoking, and alcohol consumption.  Avoid foods and drinks that make your symptoms worse, such as:  Caffeine or alcoholic drinks.  Chocolate.  Peppermint or mint flavorings.  Garlic and onions.  Spicy foods.  Citrus fruits, such as oranges, lemons, or limes.  Tomato-based foods such as sauce, chili, salsa, and pizza.  Fried and fatty foods.  Avoid lying down for the 3 hours prior to your bedtime or prior to taking a nap.  Eat small, frequent meals instead of large meals.  Wear loose-fitting clothing. Do not wear anything tight around your waist that causes pressure on your stomach.  Raise the head of your bed 6 to 8 inches with wood blocks to help you sleep. Extra pillows will not help.  Only take over-the-counter or prescription medicines for pain, discomfort, or fever as directed by your caregiver.  Do not take aspirin, ibuprofen, or other nonsteroidal anti-inflammatory drugs (NSAIDs). SEEK IMMEDIATE MEDICAL CARE IF:   You have pain in your arms, neck, jaw, teeth, or back.  Your pain increases or changes in intensity or duration.  You develop nausea, vomiting, or sweating (diaphoresis).  You develop shortness of breath, or you faint.  Your vomit is green, yellow, black, or looks like coffee grounds or blood.  Your stool is red, bloody, or black. These symptoms could be signs of other problems, such as heart disease, gastric bleeding, or esophageal bleeding. MAKE SURE YOU:   Understand these instructions.  Will watch your condition.  Will get help right away if you are not doing well or get worse. Document Released: 12/03/2004 Document Revised: 05/18/2011 Document Reviewed: 09/12/2010 [facility] Patient Information 2014 [place], [place].

## 2013-04-21 ENCOUNTER — Ambulatory Visit (INDEPENDENT_AMBULATORY_CARE_PROVIDER_SITE_OTHER): Payer: Medicare Other | Admitting: *Deleted

## 2013-04-21 DIAGNOSIS — I4891 Unspecified atrial fibrillation: Secondary | ICD-10-CM | POA: Diagnosis not present

## 2013-04-21 DIAGNOSIS — Z5181 Encounter for therapeutic drug level monitoring: Secondary | ICD-10-CM

## 2013-04-21 DIAGNOSIS — Z7901 Long term (current) use of anticoagulants: Secondary | ICD-10-CM

## 2013-04-21 DIAGNOSIS — G459 Transient cerebral ischemic attack, unspecified: Secondary | ICD-10-CM | POA: Diagnosis not present

## 2013-04-21 LAB — POCT INR: INR: 2.2

## 2013-04-21 NOTE — Progress Notes (Signed)
Case discussed with Dr. McLean soon after the resident saw the patient.  We reviewed the resident's history and exam and pertinent patient test results.  I agree with the assessment, diagnosis, and plan of care documented in the resident's note. 

## 2013-04-21 NOTE — Assessment & Plan Note (Signed)
Rx refills of Norco/Vicodin today  If back pain continues consider repeat MRI prior MRI thoracic 2006 showed accentuated thoracic kyphotic angulation with degenerative changes mainly at the apex with kyphosis, particularly T5-6, T6-7, T7-8, and T8-9. A couple of minimal sized disc protrusions lower thoracic spine. Normal thoracic cord. No compression fractures.  She also may have a muscle spasm. Advised to try Flexeril qhs. Side effect sedation which may help. She had an old Rx but had not been taking it.  Given Good Rx coupons for Flexeril

## 2013-04-21 NOTE — Assessment & Plan Note (Signed)
Abdominal pain is chronic  She has a h/o complex ventral hernia repair  Advised pt to f/u with general surgery prn

## 2013-04-21 NOTE — Assessment & Plan Note (Signed)
Can try OTC Zegerid in addition to Prilosec

## 2013-04-26 ENCOUNTER — Ambulatory Visit (INDEPENDENT_AMBULATORY_CARE_PROVIDER_SITE_OTHER): Payer: Medicare Other | Admitting: Internal Medicine

## 2013-04-26 ENCOUNTER — Encounter: Payer: Self-pay | Admitting: Internal Medicine

## 2013-04-26 VITALS — BP 129/74 | HR 110 | Temp 97.8°F | Wt 201.2 lb

## 2013-04-26 DIAGNOSIS — E119 Type 2 diabetes mellitus without complications: Secondary | ICD-10-CM | POA: Diagnosis not present

## 2013-04-26 DIAGNOSIS — K219 Gastro-esophageal reflux disease without esophagitis: Secondary | ICD-10-CM | POA: Diagnosis not present

## 2013-04-26 DIAGNOSIS — J069 Acute upper respiratory infection, unspecified: Secondary | ICD-10-CM | POA: Diagnosis not present

## 2013-04-26 DIAGNOSIS — I4891 Unspecified atrial fibrillation: Secondary | ICD-10-CM | POA: Diagnosis not present

## 2013-04-26 DIAGNOSIS — E785 Hyperlipidemia, unspecified: Secondary | ICD-10-CM | POA: Diagnosis not present

## 2013-04-26 DIAGNOSIS — G8929 Other chronic pain: Secondary | ICD-10-CM | POA: Diagnosis not present

## 2013-04-26 DIAGNOSIS — I10 Essential (primary) hypertension: Secondary | ICD-10-CM | POA: Diagnosis not present

## 2013-04-26 DIAGNOSIS — K089 Disorder of teeth and supporting structures, unspecified: Secondary | ICD-10-CM | POA: Diagnosis not present

## 2013-04-26 DIAGNOSIS — M549 Dorsalgia, unspecified: Secondary | ICD-10-CM | POA: Diagnosis not present

## 2013-04-26 DIAGNOSIS — R109 Unspecified abdominal pain: Secondary | ICD-10-CM | POA: Diagnosis not present

## 2013-04-26 MED ORDER — ACETAMINOPHEN 325 MG PO TABS: 650.0000 mg | ORAL_TABLET | ORAL | Status: DC | PRN

## 2013-04-26 MED ORDER — GUAIFENESIN-DM 100-10 MG/5ML PO SYRP: 5.0000 mL | ORAL_SOLUTION | ORAL | Status: DC | PRN

## 2013-04-26 MED ORDER — HYDROCOD POLST-CHLORPHEN POLST 10-8 MG/5ML PO LQCR: 5.0000 mL | Freq: Two times a day (BID) | ORAL | Status: DC | PRN

## 2013-04-26 NOTE — Patient Instructions (Signed)
1. Drink plenty of fluid and rest well. 2. Please take OTC Tylenol for fever and pain 3. Please take OTC Robitussin 5 ml oral every 4- 6 hours as needed 4 you mat take Tussionex 5  ml oral every 12 hours as needed if you are able to afford the copay. 5. Call the clinic if you have fever, SOB, worsening of cough, sputum or other current symptoms.  6. Follow up in 7-10 days if not better.   Upper Respiratory Infection, Adult An upper respiratory infection (URI) is also known as the common cold. It is often caused by a type of germ (virus). Colds are easily spread (contagious). You can pass it to others by kissing, coughing, sneezing, or drinking out of the same glass. Usually, you get better in 1 or 2 weeks.  HOME CARE   Only take medicine as told by your doctor.  Use a warm mist humidifier or breathe in steam from a hot shower.  Drink enough water and fluids to keep your pee (urine) clear or pale yellow.  Get plenty of rest.  Return to work when your temperature is back to normal or as told by your doctor. You may use a face mask and wash your hands to stop your cold from spreading. GET HELP RIGHT AWAY IF:   After the first few days, you feel you are getting worse.  You have questions about your medicine.  You have chills, shortness of breath, or brown or red spit (mucus).  You have yellow or brown snot (nasal discharge) or pain in the face, especially when you bend forward.  You have a fever, puffy (swollen) neck, pain when you swallow, or white spots in the back of your throat.  You have a bad headache, ear pain, sinus pain, or chest pain.  You have a high-pitched whistling sound when you breathe in and out (wheezing).  You have a lasting cough or cough up blood.  You have sore muscles or a stiff neck. MAKE SURE YOU:   Understand these instructions.  Will watch your condition.  Will get help right away if you are not doing well or get worse. Document Released:  08/12/2007 Document Revised: 05/18/2011 Document Reviewed: 06/30/2010 [facility] Patient Information 2014 [place], [place].

## 2013-04-26 NOTE — Assessment & Plan Note (Addendum)
Assessment: The clinical manifestation is suggestive of viral URI.   Plan - patient is instructed to drink plenty of fluid and rest well. - may take Tylenol 650 mg po every 6 h PRN for pain and fever. - may take Robitussin-DM 5 mL by mouth every 4-6 hours as needed for cough -May take Tussionex 5 ML by mouth every 12 hours as needed for cough. -Call the clinic if she has fever, SOB, worsening of cough, sputum or other current symptoms.  -Follow up in 7-10 days if not better.

## 2013-04-26 NOTE — Progress Notes (Signed)
Patient: [name]   MRN: [id number]  DOB: August 28, 1950  PCP: Cresenciano Genre, MD   Subjective:    CC: Headache and Cough  HPI Comments: 63 y.o female PMH depression, obesity, skin neoplasm (right leg), carotid stenosis, Fibromyalgia, Internal hemorrhoid, GERD (gastroesophageal reflux disease), Chronic gastritis, Transaminase or LDH elevation, Hyperlipidemia (LDL 58 01/2013), Fatty liver, Chronic pain (back pain), chronic h/a, chronic abdominal pain, diverticulosis (follow with Dr. Sharlett Iles GI), Diabetic peripheral neuropathy, Dysphagia with h/o strictures and dilation, Hypertension (controlled), Cholecystitis s/p cholecystectomy, Sarcoidosis, CHF (congestive heart failure), Atrial fibrillation on Coumadin (INR followed by cardiology), OSA (obstructive sleep apnea) w/o cpap, Exertional dyspnea, Hypothyroidism, Type II diabetes mellitus (HA1C 6.6 03/2013), H/O hiatal hernia, h/o ventral hernia with complex repair, epileptic seizure (as a child), Osteoarthritis, diverticulosis, digoxin drug-induced hypotension and bradycardia, borderline osteopenia 2010.    1) acute URI Patient states that she started to have chills, headache, sneezing, rhinorrhea, post nasal drip, throat irration, ear aching, loss of appetite, and wheezing, productive couh with mild whitish sputum two days ago.  She could not sleep well at night time due to cough. She states that she feel very sick.  Denies fever, sore throat, SOB, chest pain, nausea, vomiting, abdominal pain, diarrhea, constipation. Denies sick contact. She did not take any medications and is here for evaluation.   Of note, she reports a hx of PNA in 2013 and had Pneumococcal vaccine in 2012. She had influenza vaccine this season.   She has a history of CHF with last echo in 2009 and EF was 55-65%. She denies increased SOB though admits some wheezing. Sleeps on 1-2 pillows.   Review of Systems: Per HPI.   Current Outpatient Medications: Current Outpatient  Prescriptions  Medication Sig Dispense Refill  . ALPRAZolam (XANAX) 0.5 MG tablet Take 0.5 mg by mouth at bedtime as needed. For anxiety/sleep.      Marland Kitchen amitriptyline (ELAVIL) 100 MG tablet Take 0.5 tablets (50 mg total) by mouth at bedtime.  45 tablet  1  . atenolol (TENORMIN) 25 MG tablet Take 1.5 tablets (37.5 mg total) by mouth 2 (two) times daily.  135 tablet  4  . calcium carbonate (OS-CAL) 600 MG TABS Take 600 mg by mouth daily.      . cyclobenzaprine (FLEXERIL) 5 MG tablet Take 1 tablet (5 mg total) by mouth 2 (two) times daily as needed. For fibromyalgia  60 tablet  1  . diltiazem (DILACOR XR) 180 MG 24 hr capsule Take 1 capsule (180 mg total) by mouth daily.  90 capsule  4  . FLUoxetine (PROZAC) 20 MG tablet Take 1 tablet (20 mg total) by mouth daily.  90 tablet  4  . furosemide (LASIX) 20 MG tablet Take 1 tablet (20 mg total) by mouth daily.  90 tablet  4  . gabapentin (NEURONTIN) 100 MG capsule Take 1 capsule (100 mg total) by mouth 3 (three) times daily.  270 capsule  4  . HYDROcodone-acetaminophen (NORCO/VICODIN) 5-325 MG per tablet Take 1 tablet by mouth every 6 (six) hours as needed for moderate pain.  120 tablet  0  . levothyroxine (SYNTHROID, LEVOTHROID) 75 MCG tablet Take 1 tablet (75 mcg total) by mouth every morning.  90 tablet  4  . losartan (COZAAR) 25 MG tablet Take 0.5 tablets (12.5 mg total) by mouth daily.  90 tablet  1  . metFORMIN (GLUCOPHAGE) 500 MG tablet Take 1 tablet (500 mg total) by mouth daily with breakfast.  30 tablet  5  . omeprazole (PRILOSEC) 20 MG capsule Take 1 capsule (20 mg total) by mouth daily.  90 capsule  4  . potassium chloride (K-DUR) 10 MEQ tablet Take 1 tablet (10 mEq total) by mouth daily.  90 tablet  4  . pravastatin (PRAVACHOL) 40 MG tablet Take 1 tablet (40 mg total) by mouth daily.  90 tablet  4  . warfarin (COUMADIN) 5 MG tablet Take 2.5-5 mg by mouth daily. Take 5mg  on Sunday and Thursday. All other days take 2.5mg .       No current  facility-administered medications for this visit.    Allergies: Allergies  Allergen Reactions  . Gemfibrozil Swelling    REACTION: Angioedema  . Latex Other (See Comments)    Blisters where touched or applied  . Penicillins Hives    Will spread in patches all over the body.  . Adhesive [Tape] Other (See Comments)    Will blister skin where applied - do not use BAND-AIDS.  Marland Kitchen Codeine Hives    Will spread in patches all over the body.  . Ace Inhibitors     Cough   . Digoxin And Related     Makes BP drop    Past Medical History  Diagnosis Date  . Depression   . Obesity   . Skin neoplasm   . Bunion   . Carotid stenosis   . Fibromyalgia   . Internal hemorrhoid   . GERD (gastroesophageal reflux disease)   . Chronic gastritis   . Transaminase or LDH elevation   . Mild cognitive impairment   . Hyperlipidemia   . Fatty liver   . Lumbar back pain   . Diabetic peripheral neuropathy   . Dysphagia     no documented strictures but responded positively to dilation in past.   . Hypertension   . Cholecystitis     s/p cholecystectomy  . Sarcoidosis   . Fibromyalgia   . CHF (congestive heart failure)   . Skin cancer 1990's    "front of my right leg"  . Atrial fibrillation   . OSA (obstructive sleep apnea) 2003    "can't sleep w/that darm machine"  . Exertional dyspnea   . Hypothyroidism   . Type II diabetes mellitus   . TIA (transient ischemic attack)     07/29/11 pt denies this history  . H/O hiatal hernia   . Epileptic seizure, tonic     .No meds since age of 23.  . Osteoarthritis   . Stroke     tia  . Hypothyroid   . Diverticulosis   . Digoxin toxicity     08/2011  . Bradycardia, drug induced     08/2011 (dig)  . Drug-induced hypotension     08/2011 (Dig)    Objective:    Physical Exam: Filed Vitals:   04/26/13 1320  BP: 129/74  Pulse: 110  Temp: 97.8 F (36.6 C)  TempSrc: Oral  Weight: 201 lb 3.2 oz (91.264 kg)  SpO2: 98%     General: Vital signs  reviewed and noted. Well-developed, well-nourished, in no acute distress; alert, appropriate and cooperative throughout examination.  Head: Normocephalic, atraumatic.  Facial: No tenderness to palpation on maxillary and frontal sinus area.   Ear: No erythema or trauma noted on auricle and Tragus. No tenderness with tragal pressure or when the auricle is manipulated or pulled. No edema, erythema or frank necrosis noted on ear canal. White and grey Debris/cerumen noted  The tympanic membrane intact with good  reflex. no presence of an air-fluid level along the tympanic membrane.   Nostrils: No erythema, drainage, swelling, ulceration noted  Pharyngeal area: moderate erythema noted. No exudates. No tonsil enlargement.    Lungs:  Normal respiratory effort. Clear to auscultation BL without crackles or wheezes.  Heart:  irregularly irregular, HR 110's. S1 and S2 normal without gallop, rubs, murmur.  Abdomen:  BS normoactive. Soft, Nondistended, non-tender.  No masses or organomegaly.  Extremities: No pretibial edema.   Assessment/ Plan:

## 2013-04-28 NOTE — Progress Notes (Signed)
Case discussed with Dr. Li at the time of the visit.  We reviewed the resident's history and exam and pertinent patient test results.  I agree with the assessment, diagnosis, and plan of care documented in the resident's note. 

## 2013-05-01 ENCOUNTER — Other Ambulatory Visit: Payer: Self-pay | Admitting: *Deleted

## 2013-05-01 DIAGNOSIS — I10 Essential (primary) hypertension: Secondary | ICD-10-CM

## 2013-05-01 DIAGNOSIS — K219 Gastro-esophageal reflux disease without esophagitis: Secondary | ICD-10-CM

## 2013-05-01 DIAGNOSIS — R05 Cough: Secondary | ICD-10-CM

## 2013-05-01 DIAGNOSIS — R059 Cough, unspecified: Secondary | ICD-10-CM

## 2013-05-01 DIAGNOSIS — E785 Hyperlipidemia, unspecified: Secondary | ICD-10-CM

## 2013-05-01 MED ORDER — PRAVASTATIN SODIUM 40 MG PO TABS: 40.0000 mg | ORAL_TABLET | Freq: Every day | ORAL | Status: DC

## 2013-05-01 MED ORDER — ATENOLOL 25 MG PO TABS: 37.5000 mg | ORAL_TABLET | Freq: Two times a day (BID) | ORAL | Status: DC

## 2013-05-01 MED ORDER — OMEPRAZOLE 20 MG PO CPDR: 20.0000 mg | DELAYED_RELEASE_CAPSULE | Freq: Every day | ORAL | Status: DC

## 2013-05-01 MED ORDER — LOSARTAN POTASSIUM 25 MG PO TABS: 12.5000 mg | ORAL_TABLET | Freq: Every day | ORAL | Status: DC

## 2013-05-07 ENCOUNTER — Emergency Department (HOSPITAL_COMMUNITY): Payer: Medicare Other

## 2013-05-07 ENCOUNTER — Emergency Department (HOSPITAL_COMMUNITY)
Admission: EM | Admit: 2013-05-07 | Discharge: 2013-05-07 | Disposition: A | Discharge status: Home / Self Care | Payer: Medicare Other | Attending: Emergency Medicine | Admitting: Emergency Medicine

## 2013-05-07 ENCOUNTER — Encounter (HOSPITAL_COMMUNITY): Payer: Self-pay | Admitting: Emergency Medicine

## 2013-05-07 DIAGNOSIS — Z79899 Other long term (current) drug therapy: Secondary | ICD-10-CM | POA: Insufficient documentation

## 2013-05-07 DIAGNOSIS — Z91040 Latex allergy status: Secondary | ICD-10-CM | POA: Insufficient documentation

## 2013-05-07 DIAGNOSIS — F329 Major depressive disorder, single episode, unspecified: Secondary | ICD-10-CM | POA: Diagnosis not present

## 2013-05-07 DIAGNOSIS — E669 Obesity, unspecified: Secondary | ICD-10-CM | POA: Insufficient documentation

## 2013-05-07 DIAGNOSIS — Z8673 Personal history of transient ischemic attack (TIA), and cerebral infarction without residual deficits: Secondary | ICD-10-CM | POA: Diagnosis not present

## 2013-05-07 DIAGNOSIS — S0990XA Unspecified injury of head, initial encounter: Secondary | ICD-10-CM | POA: Diagnosis not present

## 2013-05-07 DIAGNOSIS — M545 Low back pain, unspecified: Secondary | ICD-10-CM | POA: Insufficient documentation

## 2013-05-07 DIAGNOSIS — S59909A Unspecified injury of unspecified elbow, initial encounter: Secondary | ICD-10-CM | POA: Diagnosis not present

## 2013-05-07 DIAGNOSIS — E1142 Type 2 diabetes mellitus with diabetic polyneuropathy: Secondary | ICD-10-CM | POA: Insufficient documentation

## 2013-05-07 DIAGNOSIS — I509 Heart failure, unspecified: Secondary | ICD-10-CM | POA: Insufficient documentation

## 2013-05-07 DIAGNOSIS — S59919A Unspecified injury of unspecified forearm, initial encounter: Secondary | ICD-10-CM

## 2013-05-07 DIAGNOSIS — S71009A Unspecified open wound, unspecified hip, initial encounter: Secondary | ICD-10-CM | POA: Insufficient documentation

## 2013-05-07 DIAGNOSIS — W009XXA Unspecified fall due to ice and snow, initial encounter: Secondary | ICD-10-CM

## 2013-05-07 DIAGNOSIS — F3289 Other specified depressive episodes: Secondary | ICD-10-CM | POA: Diagnosis not present

## 2013-05-07 DIAGNOSIS — S99929A Unspecified injury of unspecified foot, initial encounter: Principal | ICD-10-CM

## 2013-05-07 DIAGNOSIS — R51 Headache: Secondary | ICD-10-CM | POA: Insufficient documentation

## 2013-05-07 DIAGNOSIS — I10 Essential (primary) hypertension: Secondary | ICD-10-CM | POA: Diagnosis not present

## 2013-05-07 DIAGNOSIS — G40909 Epilepsy, unspecified, not intractable, without status epilepticus: Secondary | ICD-10-CM | POA: Insufficient documentation

## 2013-05-07 DIAGNOSIS — W108XXA Fall (on) (from) other stairs and steps, initial encounter: Secondary | ICD-10-CM | POA: Insufficient documentation

## 2013-05-07 DIAGNOSIS — E039 Hypothyroidism, unspecified: Secondary | ICD-10-CM | POA: Insufficient documentation

## 2013-05-07 DIAGNOSIS — S24101A Unspecified injury at T1 level of thoracic spinal cord, initial encounter: Secondary | ICD-10-CM | POA: Diagnosis not present

## 2013-05-07 DIAGNOSIS — E1149 Type 2 diabetes mellitus with other diabetic neurological complication: Secondary | ICD-10-CM | POA: Diagnosis not present

## 2013-05-07 DIAGNOSIS — I4891 Unspecified atrial fibrillation: Secondary | ICD-10-CM | POA: Diagnosis not present

## 2013-05-07 DIAGNOSIS — M1990 Unspecified osteoarthritis, unspecified site: Secondary | ICD-10-CM | POA: Insufficient documentation

## 2013-05-07 DIAGNOSIS — S71109A Unspecified open wound, unspecified thigh, initial encounter: Secondary | ICD-10-CM | POA: Insufficient documentation

## 2013-05-07 DIAGNOSIS — S99919A Unspecified injury of unspecified ankle, initial encounter: Principal | ICD-10-CM

## 2013-05-07 DIAGNOSIS — S8990XA Unspecified injury of unspecified lower leg, initial encounter: Secondary | ICD-10-CM | POA: Insufficient documentation

## 2013-05-07 DIAGNOSIS — Z8739 Personal history of other diseases of the musculoskeletal system and connective tissue: Secondary | ICD-10-CM | POA: Diagnosis not present

## 2013-05-07 DIAGNOSIS — Y939 Activity, unspecified: Secondary | ICD-10-CM | POA: Insufficient documentation

## 2013-05-07 DIAGNOSIS — IMO0002 Reserved for concepts with insufficient information to code with codable children: Secondary | ICD-10-CM | POA: Diagnosis not present

## 2013-05-07 DIAGNOSIS — S0993XA Unspecified injury of face, initial encounter: Secondary | ICD-10-CM | POA: Diagnosis not present

## 2013-05-07 DIAGNOSIS — T07XXXA Unspecified multiple injuries, initial encounter: Secondary | ICD-10-CM

## 2013-05-07 DIAGNOSIS — K219 Gastro-esophageal reflux disease without esophagitis: Secondary | ICD-10-CM | POA: Diagnosis not present

## 2013-05-07 DIAGNOSIS — M25529 Pain in unspecified elbow: Secondary | ICD-10-CM | POA: Diagnosis not present

## 2013-05-07 DIAGNOSIS — S6990XA Unspecified injury of unspecified wrist, hand and finger(s), initial encounter: Secondary | ICD-10-CM | POA: Insufficient documentation

## 2013-05-07 DIAGNOSIS — E785 Hyperlipidemia, unspecified: Secondary | ICD-10-CM | POA: Diagnosis not present

## 2013-05-07 DIAGNOSIS — Z880 Allergy status to penicillin: Secondary | ICD-10-CM | POA: Insufficient documentation

## 2013-05-07 DIAGNOSIS — M25571 Pain in right ankle and joints of right foot: Secondary | ICD-10-CM

## 2013-05-07 DIAGNOSIS — Z85828 Personal history of other malignant neoplasm of skin: Secondary | ICD-10-CM | POA: Diagnosis not present

## 2013-05-07 DIAGNOSIS — Z9889 Other specified postprocedural states: Secondary | ICD-10-CM | POA: Insufficient documentation

## 2013-05-07 DIAGNOSIS — S199XXA Unspecified injury of neck, initial encounter: Secondary | ICD-10-CM | POA: Diagnosis not present

## 2013-05-07 DIAGNOSIS — Z7901 Long term (current) use of anticoagulants: Secondary | ICD-10-CM | POA: Insufficient documentation

## 2013-05-07 DIAGNOSIS — Y929 Unspecified place or not applicable: Secondary | ICD-10-CM | POA: Insufficient documentation

## 2013-05-07 DIAGNOSIS — M25579 Pain in unspecified ankle and joints of unspecified foot: Secondary | ICD-10-CM | POA: Diagnosis not present

## 2013-05-07 DIAGNOSIS — M25522 Pain in left elbow: Secondary | ICD-10-CM

## 2013-05-07 MED ORDER — METHOCARBAMOL 500 MG PO TABS
500.0000 mg | ORAL_TABLET | Freq: Two times a day (BID) | ORAL | Status: DC
Start: 2013-05-07 — End: 2013-08-03

## 2013-05-07 MED ORDER — HYDROCODONE-ACETAMINOPHEN 5-325 MG PO TABS
2.0000 | ORAL_TABLET | Freq: Once | ORAL | Status: AC
  Administered 2013-05-07: 2 via ORAL
  Filled 2013-05-07: qty 2

## 2013-05-07 NOTE — Discharge Instructions (Signed)
1. Medications: robaxin, usual home medications 2. Treatment: rest, drink plenty of fluids, gentle stretching as discussed, use ice, elevate extremities, keep wounds clean with warm soap and water 3. Follow Up: Please followup with your primary doctor for discussion of your diagnoses and further evaluation after today's visit;    Ankle Pain Ankle pain is a common symptom. The bones, cartilage, tendons, and muscles of the ankle joint perform a lot of work each day. The ankle joint holds your body weight and allows you to move around. Ankle pain can occur on either side or back of 1 or both ankles. Ankle pain may be sharp and burning or dull and aching. There may be tenderness, stiffness, redness, or warmth around the ankle. The pain occurs more often when a person walks or puts pressure on the ankle. CAUSES  There are many reasons ankle pain can develop. It is important to work with your caregiver to identify the cause since many conditions can impact the bones, cartilage, muscles, and tendons. Causes for ankle pain include:  Injury, including a break (fracture), sprain, or strain often due to a fall, sports, or a high-impact activity.  Swelling (inflammation) of a tendon (tendonitis).  Achilles tendon rupture.  Ankle instability after repeated sprains and strains.  Poor foot alignment.  Pressure on a nerve (tarsal tunnel syndrome).  Arthritis in the ankle or the lining of the ankle.  Crystal formation in the ankle (gout or pseudogout). DIAGNOSIS  A diagnosis is based on your medical history, your symptoms, results of your physical exam, and results of diagnostic tests. Diagnostic tests may include X-ray exams or a computerized magnetic scan (magnetic resonance imaging, MRI). TREATMENT  Treatment will depend on the cause of your ankle pain and may include:  Keeping pressure off the ankle and limiting activities.  Using crutches or other walking support (a cane or brace).  Using rest,  ice, compression, and elevation.  Participating in physical therapy or home exercises.  Wearing shoe inserts or special shoes.  Losing weight.  Taking medications to reduce pain or swelling or receiving an injection.  Undergoing surgery. HOME CARE INSTRUCTIONS   Only take over-the-counter or prescription medicines for pain, discomfort, or fever as directed by your caregiver.  Put ice on the injured area.  Put ice in a plastic bag.  Place a towel between your skin and the bag.  Leave the ice on for 15-20 minutes at a time, 03-04 times a day.  Keep your leg raised (elevated) when possible to lessen swelling.  Avoid activities that cause ankle pain.  Follow specific exercises as directed by your caregiver.  Record how often you have ankle pain, the location of the pain, and what it feels like. This information may be helpful to you and your caregiver.  Ask your caregiver about returning to work or sports and whether you should drive.  Follow up with your caregiver for further examination, therapy, or testing as directed. SEEK MEDICAL CARE IF:   Pain or swelling continues or worsens beyond 1 week.  You have an oral temperature above 102 F (38.9 C).  You are feeling unwell or have chills.  You are having an increasingly difficult time with walking.  You have loss of sensation or other new symptoms.  You have questions or concerns. MAKE SURE YOU:   Understand these instructions.  Will watch your condition.  Will get help right away if you are not doing well or get worse. Document Released: 08/13/2009 Document Revised: 05/18/2011  Document Reviewed: 08/13/2009 [facility] Patient Information 2014 [place].

## 2013-05-07 NOTE — ED Provider Notes (Signed)
CSN: [id number]     Arrival date & time 05/07/13  1323 History   First MD Initiated Contact with Patient 05/07/13 1503     Chief Complaint  Patient presents with  . Fall     (Consider location/radiation/quality/duration/timing/severity/associated sxs/prior Treatment) The history is provided by the patient and medical records. No language interpreter was used.    [name] is a 63 y.o. female  with a hx of obesity, fibromyalgia, GERD, chronic gastritis, chronic low back pain, chronic headache, CHF, a-fib (on coumadin), NIDDM, HTN, TIA, CVA presents to the Emergency Department complaining of acute, persistent joint pains onset 1 hour PTA. Pt reports she slipped on the ice and fell with her right leg underneath her down 8 stairs.  She is unsure if she hit her head, but adamantly denies an LOC.  She c/o right ankle pain, left elbow pain and t-spine pain.  She reports a significant hx of low back pain everyday without hx of surgery and reports this is in a different location.  PT also reports daily headaches located on the right side, but reports this is located across the frontal region and different as well.  Pt she was able to ambulate with assistance to the car and into the ED.  Pt denies numbness, tingling and weakness. She denies taking anything for the pain.  Nothing makes her symptoms better and walking makes them worse. She denies fever, chills, neck pain, CP, SOB, abd pain, N/V/D, weakness, dizziness, syncope, dysuria.    Past Medical History  Diagnosis Date  . Depression   . Obesity   . Skin neoplasm   . Bunion   . Carotid stenosis   . Fibromyalgia   . Internal hemorrhoid   . GERD (gastroesophageal reflux disease)   . Chronic gastritis   . Transaminase or LDH elevation   . Mild cognitive impairment   . Hyperlipidemia   . Fatty liver   . Lumbar back pain   . Diabetic peripheral neuropathy   . Dysphagia     no documented strictures but responded positively to dilation in  past.   . Hypertension   . Cholecystitis     s/p cholecystectomy  . Sarcoidosis   . Fibromyalgia   . CHF (congestive heart failure)   . Skin cancer 1990's    "front of my right leg"  . Atrial fibrillation   . OSA (obstructive sleep apnea) 2003    "can't sleep w/that darm machine"  . Exertional dyspnea   . Hypothyroidism   . Type II diabetes mellitus   . TIA (transient ischemic attack)     07/29/11 pt denies this history  . H/O hiatal hernia   . Epileptic seizure, tonic     .No meds since age of 47.  . Osteoarthritis   . Stroke     tia  . Hypothyroid   . Diverticulosis   . Digoxin toxicity     08/2011  . Bradycardia, drug induced     08/2011 (dig)  . Drug-induced hypotension     08/2011 (Dig)   Past Surgical History  Procedure Laterality Date  . Hiatal hernia repair  1990    "had to have scar tissue removed 6 months after repair"  . Esophageal dilation    . Cataract extraction w/phaco  10/27/2010    Procedure: CATARACT EXTRACTION PHACO AND INTRAOCULAR LENS PLACEMENT (IOC);  Surgeon: Williams Che;  Location: [facility];  Service: Ophthalmology;  Laterality: Left;  CDE- 1.78  . Cataract  extraction w/phaco  07/13/2011    Procedure: CATARACT EXTRACTION PHACO AND INTRAOCULAR LENS PLACEMENT (IOC);  Surgeon: Williams Che, MD;  Location: [facility];  Service: Ophthalmology;  Laterality: Right;  CDE:  1.65  . Liposuction  1992  . Inverted nipples  1992  . Breast surgery  ~ 2010    excision milk duct; right breast  . Foot surgery  ~ 2011    ~straightened toe left foot & scraped bone below big toe"  . Foot surgery  ~ 2011    "scraped bone of big toe; shortened middle toe; right foot"  . Cholecystectomy  1980's  . Dilation and curettage of uterus  1970; 1976  . Abdominal hysterectomy  1980's    partial  . Tubal ligation  1976  . Bilateral oophorectomy  1980's    "after partial hysterectomy"  . Skin cancer excision  1990's    "front side of right shin"  . Ventral hernia repair   07/29/2011    Procedure: LAPAROSCOPIC VENTRAL HERNIA;  Surgeon: Adin Hector, MD;  Location: [facility];  Service: General;  Laterality: N/A;  multiple incarcerated hernias with mesh  . Other surgical history      mutiple ab surgeries for abdominal hernia  . Other surgical history      multiple dilation of esophagus  . Other surgical history      right lens implant 07/2011, left lens implant 10/2010  . Other surgical history      right breast bx=benign   Family History  Problem Relation Age of Onset  . Crohn's disease Mother   . Colitis Mother     Crohns  . Anesthesia problems Mother   . Arthritis Mother     rheumatoid; maternal side of family also with RA  . Cancer Father     skin  . Diabetes Father   . Heart disease Father   . Melanoma Father   . Coronary artery disease Father   . Diabetes Sister   . Depression Maternal Uncle   . Dementia Maternal Uncle   . Hypotension Neg Hx   . Malignant hyperthermia Neg Hx   . Pseudochol deficiency Neg Hx   . Colon cancer Paternal Uncle     ? if stomach or colon    History  Substance Use Topics  . Smoking status: Never Smoker   . Smokeless tobacco: Never Used  . Alcohol Use: No   OB History   Grav Para Term Preterm Abortions TAB SAB Ect Mult Living                 Review of Systems  Constitutional: Negative for fever and chills.  HENT: Negative for dental problem, facial swelling and nosebleeds.   Eyes: Negative for visual disturbance.  Respiratory: Negative for cough, chest tightness, shortness of breath, wheezing and stridor.   Cardiovascular: Negative for chest pain.  Gastrointestinal: Negative for nausea, vomiting and abdominal pain.  Genitourinary: Negative for dysuria, hematuria and flank pain.  Musculoskeletal: Positive for arthralgias, back pain and joint swelling. Negative for gait problem, neck pain and neck stiffness.  Skin: Negative for rash and wound.  Neurological: Positive for headaches. Negative for syncope,  weakness, light-headedness and numbness.  Hematological: Does not bruise/bleed easily.  Psychiatric/Behavioral: The patient is not nervous/anxious.   All other systems reviewed and are negative.      Allergies  Gemfibrozil; Latex; Penicillins; Adhesive; Codeine; Ace inhibitors; and Digoxin and related  Home Medications   Current Outpatient Rx  Name  Route  Sig  Dispense  Refill  . acetaminophen (TYLENOL) 325 MG tablet   Oral   Take 2 tablets (650 mg total) by mouth every 4 (four) hours as needed.   50 tablet   0   . ALPRAZolam (XANAX) 0.5 MG tablet   Oral   Take 0.5 mg by mouth at bedtime as needed. For anxiety/sleep.         Marland Kitchen amitriptyline (ELAVIL) 100 MG tablet   Oral   Take 0.5 tablets (50 mg total) by mouth at bedtime.   45 tablet   1   . atenolol (TENORMIN) 25 MG tablet   Oral   Take 1.5 tablets (37.5 mg total) by mouth 2 (two) times daily.   135 tablet   4   . calcium carbonate (OS-CAL) 600 MG TABS   Oral   Take 600 mg by mouth daily.         . chlorpheniramine-HYDROcodone (TUSSIONEX) 10-8 MG/5ML LQCR   Oral   Take 5 mLs by mouth every 12 (twelve) hours as needed for cough.   115 mL   0     Patient is on oral Vicodin ( hydrocodone- APAP) PR ...   . cyclobenzaprine (FLEXERIL) 5 MG tablet   Oral   Take 1 tablet (5 mg total) by mouth 2 (two) times daily as needed. For fibromyalgia   60 tablet   1   . diltiazem (DILACOR XR) 180 MG 24 hr capsule   Oral   Take 1 capsule (180 mg total) by mouth daily.   90 capsule   4   . FLUoxetine (PROZAC) 20 MG tablet   Oral   Take 1 tablet (20 mg total) by mouth daily.   90 tablet   4   . furosemide (LASIX) 20 MG tablet   Oral   Take 1 tablet (20 mg total) by mouth daily.   90 tablet   4   . gabapentin (NEURONTIN) 100 MG capsule   Oral   Take 1 capsule (100 mg total) by mouth 3 (three) times daily.   270 capsule   4   . guaiFENesin-dextromethorphan (ROBITUSSIN DM) 100-10 MG/5ML syrup   Oral    Take 5 mLs by mouth every 4 (four) hours as needed for cough.   118 mL   0   . HYDROcodone-acetaminophen (NORCO/VICODIN) 5-325 MG per tablet   Oral   Take 1 tablet by mouth every 6 (six) hours as needed for moderate pain.   120 tablet   0   . ibuprofen (ADVIL,MOTRIN) 800 MG tablet   Oral   Take 800 mg by mouth every 8 (eight) hours as needed.         Marland Kitchen levothyroxine (SYNTHROID, LEVOTHROID) 75 MCG tablet   Oral   Take 1 tablet (75 mcg total) by mouth every morning.   90 tablet   4   . losartan (COZAAR) 25 MG tablet   Oral   Take 0.5 tablets (12.5 mg total) by mouth daily.   90 tablet   1   . metFORMIN (GLUCOPHAGE) 500 MG tablet   Oral   Take 1 tablet (500 mg total) by mouth daily with breakfast.   30 tablet   5   . omeprazole (PRILOSEC) 20 MG capsule   Oral   Take 1 capsule (20 mg total) by mouth daily.   90 capsule   4   . potassium chloride (K-DUR) 10 MEQ tablet   Oral   Take  1 tablet (10 mEq total) by mouth daily.   90 tablet   4   . pravastatin (PRAVACHOL) 40 MG tablet   Oral   Take 1 tablet (40 mg total) by mouth daily.   90 tablet   4     Please decrease current dose from 80 mg qhs to 40  ...   . warfarin (COUMADIN) 5 MG tablet   Oral   Take 2.5-5 mg by mouth daily. Take 5mg  on Sunday and Thursday. All other days take 2.5mg .         . methocarbamol (ROBAXIN) 500 MG tablet   Oral   Take 1 tablet (500 mg total) by mouth 2 (two) times daily.   20 tablet   0    BP 113/70  Pulse 101  Temp(Src) 98.1 F (36.7 C) (Oral)  Resp 18  SpO2 97%  LMP 06/03/1978 Physical Exam  Nursing note and vitals reviewed. Constitutional: She is oriented to person, place, and time. She appears well-developed and well-nourished. No distress.  HENT:  Head: Normocephalic and atraumatic.  Right Ear: Tympanic membrane, external ear and ear canal normal. No hemotympanum.  Left Ear: Tympanic membrane, external ear and ear canal normal. No hemotympanum.  Nose: Nose  normal.  Mouth/Throat: Uvula is midline, oropharynx is clear and moist and mucous membranes are normal. Mucous membranes are not dry. No uvula swelling. No oropharyngeal exudate, posterior oropharyngeal edema, posterior oropharyngeal erythema or tonsillar abscesses.  No trauma noted to the head  Eyes: Conjunctivae and EOM are normal. Pupils are equal, round, and reactive to light.  Neck: Normal range of motion and full passive range of motion without pain. Neck supple. No spinous process tenderness and no muscular tenderness present. No rigidity. Normal range of motion present.  Full ROM without pain No midline or paraspinal tenderness  Cardiovascular: Normal rate, normal heart sounds and intact distal pulses.  An irregularly irregular rhythm present.  No murmur heard. Pulses:      Radial pulses are 2+ on the right side, and 2+ on the left side.       Dorsalis pedis pulses are 2+ on the right side, and 2+ on the left side.       Posterior tibial pulses are 2+ on the right side, and 2+ on the left side.  No tachycardia Capillary refill < 3 sec Intact distal pulses  Pulmonary/Chest: Effort normal and breath sounds normal. No accessory muscle usage. No respiratory distress. She has no decreased breath sounds. She has no wheezes. She has no rhonchi. She has no rales. She exhibits no tenderness and no bony tenderness.  Clear and equal breath sounds No contusion, crepitus or flail segment  Abdominal: Soft. Normal appearance and bowel sounds are normal. She exhibits no distension. There is no tenderness. There is no rigidity, no guarding and no CVA tenderness.  No ecchymosis or contusion  Musculoskeletal:       Right shoulder: Normal.       Left shoulder: Normal.       Right elbow: Normal.      Left elbow: She exhibits normal range of motion, no swelling, no effusion, no deformity and no laceration. Tenderness (generalized) found.       Right wrist: Normal.       Right hip: Normal.       Left  hip: She exhibits laceration (abrasion). She exhibits normal range of motion, normal strength, no tenderness, no bony tenderness, no swelling, no crepitus and no deformity.  Right knee: Normal. She exhibits normal range of motion.       Left knee: Normal.       Right ankle: She exhibits decreased range of motion. She exhibits no swelling, no ecchymosis, no deformity, no laceration and normal pulse. Tenderness. Lateral malleolus tenderness found.       Left ankle: Normal.       Cervical back: Normal.       Thoracic back: She exhibits tenderness (paraspinal). She exhibits normal range of motion.       Lumbar back: She exhibits normal range of motion.       Left lower leg: She exhibits laceration (abrasion).       Legs: Full range of motion of the T-spine and L-spine No tenderness to palpation of the spinous processes of the T-spine or L-spine Mild tenderness to palpation of the paraspinous muscles of the T-spine  Full ROM of the left elbow without difficulty; mild general pain to palpation  Mildly decreased ROM of the right ankle 2/2 pain  Lymphadenopathy:    She has no cervical adenopathy.  Neurological: She is alert and oriented to person, place, and time. She has normal reflexes. No cranial nerve deficit. GCS eye subscore is 4. GCS verbal subscore is 5. GCS motor subscore is 6.  Reflex Scores:      Tricep reflexes are 2+ on the right side and 2+ on the left side.      Bicep reflexes are 2+ on the right side and 2+ on the left side.      Brachioradialis reflexes are 2+ on the right side and 2+ on the left side.      Patellar reflexes are 2+ on the right side and 2+ on the left side.      Achilles reflexes are 2+ on the right side and 2+ on the left side. Speech is clear and goal oriented, follows commands Normal strength in upper and lower extremities bilaterally including dorsiflexion and plantar flexion, strong and equal grip strength Sensation normal to light and sharp  touch Moves extremities without ataxia, coordination intact   Skin: Skin is warm and dry. No rash noted. She is not diaphoretic. No erythema.  Psychiatric: She has a normal mood and affect. Her behavior is normal.    ED Course  Procedures (including critical care time) Labs Review Labs Reviewed - No data to display Imaging Review Dg Thoracic Spine 2 View  05/07/2013   CLINICAL DATA:  Fall  EXAM: THORACIC SPINE - 2 VIEW  COMPARISON:  None.  FINDINGS: Two views of thoracic spine submitted. No acute fracture or subluxation. Mild degenerative changes mid thoracic spine. Mild degenerative changes with anterior spurring lower thoracic spine.  IMPRESSION: No acute fracture or subluxation. Mild degenerative changes mid and lower thoracic spine.   Electronically Signed   By: Lahoma Crocker M.D.   On: 05/07/2013 16:16   Dg Ankle Complete Right  05/07/2013   CLINICAL DATA:  Fall  EXAM: RIGHT ANKLE - COMPLETE 3+ VIEW  COMPARISON:  None.  FINDINGS: Three views of right ankle submitted. No acute fracture or subluxation. Ankle mortise is preserved. Small plantar spur of calcaneus. Small posterior spur of calcaneus.  IMPRESSION: Negative.   Electronically Signed   By: Lahoma Crocker M.D.   On: 05/07/2013 16:17   Ct Head Wo Contrast  05/07/2013   CLINICAL DATA:  Slipped on ice. Fell down stairs. Patient on Coumadin. Headache.  EXAM: CT HEAD WITHOUT CONTRAST  CT CERVICAL SPINE  WITHOUT CONTRAST  TECHNIQUE: Multidetector CT imaging of the head and cervical spine was performed following the standard protocol without intravenous contrast. Multiplanar CT image reconstructions of the cervical spine were also generated.  COMPARISON:  08/29/2011  FINDINGS: CT HEAD FINDINGS  Ventricles are normal in size and configuration. There are no parenchymal masses or mass effect. There are no areas of abnormal parenchymal attenuation. No evidence of a cortical infarct.  There are no extra-axial masses or abnormal fluid collections.  No  intracranial hemorrhage.  No skull fracture.  Sinuses and mastoid air cells are clear.  CT CERVICAL SPINE FINDINGS  No fracture or spondylolisthesis. There are degenerative changes, mostly facet degenerative changes. There is no significant central stenosis or neural foraminal narrowing.  The soft tissues are unremarkable.  Lung apices are clear.  IMPRESSION: HEAD CT:  No intracranial abnormality.  No skull fracture.  CERVICAL CT:  No fracture or acute finding.   Electronically Signed   By: Lajean Manes M.D.   On: 05/07/2013 16:12   Ct Cervical Spine Wo Contrast  05/07/2013   CLINICAL DATA:  Slipped on ice. Fell down stairs. Patient on Coumadin. Headache.  EXAM: CT HEAD WITHOUT CONTRAST  CT CERVICAL SPINE WITHOUT CONTRAST  TECHNIQUE: Multidetector CT imaging of the head and cervical spine was performed following the standard protocol without intravenous contrast. Multiplanar CT image reconstructions of the cervical spine were also generated.  COMPARISON:  08/29/2011  FINDINGS: CT HEAD FINDINGS  Ventricles are normal in size and configuration. There are no parenchymal masses or mass effect. There are no areas of abnormal parenchymal attenuation. No evidence of a cortical infarct.  There are no extra-axial masses or abnormal fluid collections.  No intracranial hemorrhage.  No skull fracture.  Sinuses and mastoid air cells are clear.  CT CERVICAL SPINE FINDINGS  No fracture or spondylolisthesis. There are degenerative changes, mostly facet degenerative changes. There is no significant central stenosis or neural foraminal narrowing.  The soft tissues are unremarkable.  Lung apices are clear.  IMPRESSION: HEAD CT:  No intracranial abnormality.  No skull fracture.  CERVICAL CT:  No fracture or acute finding.   Electronically Signed   By: Lajean Manes M.D.   On: 05/07/2013 16:12     EKG Interpretation None      MDM   Final diagnoses:  Fall from slipping on ice  Right ankle pain  Left elbow pain  Abrasions  of multiple sites   Callee E Blanch Media presents with multiple joint pains after a fall today.  She reports hitting her head without LOC, but does take coumadin.  Normal neurologic exam without focal neurologic deficit. No midline neck or backpain and full ROM of all major joints except the right ankle.  Will provide pain control and image.   5:26 PM Pt with adequate pain control.  Increasing ecchymosis to the contused areas.  Pt remains alert and oriented.  Patient X-Ray negative for obvious fracture or dislocation. I personally reviewed the imaging tests through PACS system.  I reviewed available ER/hospitalization records through the EMR.  Pt head and cervical CT without acute abnormality.  Wounds cleaned in the ED.  No lacerations which need repair.    Pt advised to follow up with PCP if symptoms persist for further evaluation. Patient given ASO brace while in ED, conservative therapy recommended and discussed. She is able to ambulate in the ED with the ASO brace without difficulty.  Patient will be dc home & is agreeable with above  plan.  It has been determined that no acute conditions requiring further emergency intervention are present at this time. The patient/guardian have been advised of the diagnosis and plan. We have discussed signs and symptoms that warrant return to the ED, such as changes or worsening in symptoms.   Vital signs are stable at discharge. Initially tachycardic on triage vitals, but no further tachycardia on physical exam.  Pt is agreeable to discharge and follow-up plan.    Jarrett Soho Laria Grimmett, PA-C 05/07/13 1826

## 2013-05-07 NOTE — ED Provider Notes (Signed)
Medical screening examination/treatment/procedure(s) were performed by non-physician practitioner and as supervising physician I was immediately available for consultation/collaboration.   EKG Interpretation None       [name]. Alvino Chapel, MD 05/07/13 2351

## 2013-05-07 NOTE — ED Notes (Signed)
Pt slipped on ice and fell down approx 8 stairs. She c/o L ankle and foot pain and R foot pain, L thigh pain, L elbow pain, back pain since. She has several abrasions and she is on coumadin. She states shes had a headache since the fall but denies hitting her head, denies LOC. shes a&ox4.

## 2013-05-07 NOTE — Progress Notes (Signed)
Orthopedic Tech Progress Note Patient Details:  [name] 1950/09/19 [id number]  Ortho Devices Type of Ortho Device: ASO Ortho Device/Splint Location: RLE Ortho Device/Splint Interventions: Ordered;Application   [name] 05/07/2013, 5:37 PM

## 2013-05-09 ENCOUNTER — Other Ambulatory Visit: Payer: Self-pay | Admitting: *Deleted

## 2013-05-09 DIAGNOSIS — F329 Major depressive disorder, single episode, unspecified: Secondary | ICD-10-CM

## 2013-05-09 DIAGNOSIS — I10 Essential (primary) hypertension: Secondary | ICD-10-CM

## 2013-05-09 DIAGNOSIS — F32A Depression, unspecified: Secondary | ICD-10-CM

## 2013-05-09 DIAGNOSIS — E039 Hypothyroidism, unspecified: Secondary | ICD-10-CM

## 2013-05-11 MED ORDER — AMITRIPTYLINE HCL 100 MG PO TABS: 50.0000 mg | ORAL_TABLET | Freq: Every day | ORAL | Status: DC

## 2013-05-11 MED ORDER — FLUOXETINE HCL 20 MG PO TABS: 20.0000 mg | ORAL_TABLET | Freq: Every day | ORAL | Status: DC

## 2013-05-11 MED ORDER — LEVOTHYROXINE SODIUM 75 MCG PO TABS: 75.0000 ug | ORAL_TABLET | Freq: Every morning | ORAL | Status: DC

## 2013-05-11 MED ORDER — WARFARIN SODIUM 5 MG PO TABS: 2.5000 mg | ORAL_TABLET | Freq: Every day | ORAL | Status: DC

## 2013-05-11 MED ORDER — FUROSEMIDE 20 MG PO TABS: 20.0000 mg | ORAL_TABLET | Freq: Every day | ORAL | Status: DC

## 2013-05-18 ENCOUNTER — Ambulatory Visit (INDEPENDENT_AMBULATORY_CARE_PROVIDER_SITE_OTHER): Payer: Medicare Other | Admitting: Internal Medicine

## 2013-05-18 ENCOUNTER — Encounter: Payer: Self-pay | Admitting: Internal Medicine

## 2013-05-18 VITALS — BP 128/77 | HR 99 | Temp 97.5°F | Ht 63.0 in | Wt 208.4 lb

## 2013-05-18 DIAGNOSIS — S93409A Sprain of unspecified ligament of unspecified ankle, initial encounter: Secondary | ICD-10-CM

## 2013-05-18 DIAGNOSIS — J069 Acute upper respiratory infection, unspecified: Secondary | ICD-10-CM | POA: Diagnosis not present

## 2013-05-18 DIAGNOSIS — I10 Essential (primary) hypertension: Secondary | ICD-10-CM | POA: Diagnosis not present

## 2013-05-18 DIAGNOSIS — M549 Dorsalgia, unspecified: Secondary | ICD-10-CM

## 2013-05-18 DIAGNOSIS — E119 Type 2 diabetes mellitus without complications: Secondary | ICD-10-CM | POA: Diagnosis not present

## 2013-05-18 DIAGNOSIS — I4891 Unspecified atrial fibrillation: Secondary | ICD-10-CM

## 2013-05-18 DIAGNOSIS — R269 Unspecified abnormalities of gait and mobility: Secondary | ICD-10-CM | POA: Diagnosis not present

## 2013-05-18 DIAGNOSIS — S93401A Sprain of unspecified ligament of right ankle, initial encounter: Secondary | ICD-10-CM

## 2013-05-18 DIAGNOSIS — G8929 Other chronic pain: Secondary | ICD-10-CM | POA: Diagnosis not present

## 2013-05-18 LAB — GLUCOSE, CAPILLARY: Glucose-Capillary: 202 mg/dL — ABNORMAL HIGH (ref 70–99)

## 2013-05-18 NOTE — Assessment & Plan Note (Addendum)
Will get MRI thoracic/lumbar spine with contrast  Referral placed to Dr. Theodis Sato [place] but will be unable to see until gets MRI  Addendum 05/31/13, 8:53 AM.  I received a call on the after hours pager from MRI.  They state that for evaluation of back pain and vertebral structures, MRI without contrast is all that is needed.  At their request, I have changed the order to MR spine without contrast.   -Elnora Morrison, MD

## 2013-05-18 NOTE — Assessment & Plan Note (Signed)
inr at goal 04/2013 Follows with cardiology

## 2013-05-18 NOTE — Assessment & Plan Note (Signed)
BP Readings from Last 3 Encounters:  05/18/13 128/77  05/07/13 113/70  04/26/13 129/74    Lab Results  Component Value Date   NA 140 01/12/2013   K 4.4 01/12/2013   CREATININE 0.82 01/12/2013    Assessment: Blood pressure control: controlled Progress toward BP goal:  at goal Comments: none  Plan: Medications:  continue current medications Educational resources provided: brochure Self management tools provided: other (see comments) Other plans: f/u in 5 or 08/2013

## 2013-05-18 NOTE — Progress Notes (Signed)
Subjective:    Patient ID: [name], female    DOB: [birth year], 63 y.o.   MRN: [id number]  HPI Comments: 63 y.o female PMH depression, obesity, skin neoplasm (right leg), carotid stenosis, Fibromyalgia, Internal hemorrhoid, GERD (gastroesophageal reflux disease), Chronic gastritis, Transaminase or LDH elevation, Hyperlipidemia (LDL 58 01/2013), Fatty liver, Chronic pain (back pain), chronic h/a, chronic abdominal pain, diverticulosis (follow with Dr. Sharlett Iles GI), Diabetic peripheral neuropathy, Dysphagia with h/o strictures and dilation, Hypertension (controlled), Cholecystitis s/p cholecystectomy, Sarcoidosis, CHF (congestive heart failure), Atrial fibrillation on Coumadin (INR 2.2 on 04/21/13 followed by cardiology), OSA (obstructive sleep apnea) w/o cpap, Exertional dyspnea, Hypothyroidism, Type II diabetes mellitus (HA1C 6.6 03/2013 cbg today 141), H/O hiatal hernia, h/o ventral hernia with complex repair, epileptic seizure (as a child), Osteoarthritis, diverticulosis, digoxin drug-induced hypotension and bradycardia, borderline osteopenia 2010.     1. She recently had a URI but sx's have improved and she is somewhat better.  Denies chills, +chronic h/a, sneezing has decreased, +runny nose, +postnasal drip, ear aching is decreased, she is still coughing yellow sputum but improved during the day and worse at night.  Using Tylenol, Robitussin, Tussionex with relief.    2. She recently had a fall on ice 05/07/13 walking her dogs- all imaging T spine, ankle, CT head, C-spine was negative.  She is still having right ankle pain (less) and swelling has decreased.  She has bruising to left hip which is getting better.  She had scrap to left calf which is helping     3. History of DM. cbg 202, HA1C 6.6 on 04/03/2013.  She is due for eye exam.  Will f/u in 5 or 08/2013 for DM  4. Chronic back pain daily.  She wants a referral to Dr. Carloyn Manner in [place].  Will repeat MRI since last was in 2006 with +pathology.   Reviewed MRI with accentuated thoracic kyphotic angulation with degenerative changes mainly at the apex with kyphosis, particularly T5-6, T6-7, T7-8, and T8-9.  A couple of minimal sized disc protrusions lower thoracic spine.  Normal thoracic cord.  No compression fractures.  Recent Xray thoracic spine which showed mild degenerative changes mid to lower thoracic spine.     HM: flu vaccine up to date, will need repeat DEXA 07/2013, colonoscopy Dr. Sharlett Iles 10/2008 with moderate diverticulosis throughout. Due for eye check up and pt will call   SH: going to see daughter and grandkids in 07/2013   ROS per above         Review of Systems     Objective:   Physical Exam  Nursing note and vitals reviewed. Constitutional: She is oriented to person, place, and time. Vital signs are normal. She appears well-developed and well-nourished. She is cooperative. No distress.  HENT:  Head: Normocephalic and atraumatic.  Mouth/Throat: Oropharynx is clear and moist and mucous membranes are normal. Abnormal dentition. No oropharyngeal exudate.  Missing right front tooth  Eyes: Conjunctivae are normal. Right eye exhibits no discharge. Left eye exhibits no discharge. No scleral icterus.  Cardiovascular: Normal rate.  An irregular rhythm present.  No murmur heard. +AF No lower ext edema   Pulmonary/Chest: Effort normal and breath sounds normal. No respiratory distress. She has no wheezes.  Abdominal: Soft. Bowel sounds are normal. There is no tenderness.  Obese   Musculoskeletal:  +distal to lateral and medial malleoli right ankle mild ttp.  No gross swelling   Neurological: She is alert and oriented to person, place, and time.  Slight  limp with gait today.    Skin: Skin is warm, dry and intact. Bruising noted. She is not diaphoretic.     +large healing ecchymosis to left hip  Psychiatric: She has a normal mood and affect. Her speech is normal and behavior is normal. Judgment and thought content  normal. Cognition and memory are normal.          Assessment & Plan:  F/u May/June 2015

## 2013-05-18 NOTE — Assessment & Plan Note (Signed)
Fall Screening 10/03/2012 01/12/2013 04/03/2013 04/20/2013 05/18/2013  Falls in the past year? No No Yes Yes Yes  Number of falls in past year - - 1 2 or more 1  Was there an injury with Fall? - - - - Yes  Risk Factor Category  - - - High Fall Risk -      Assessment: Progress toward fall prevention goal:  deteriorated Comments: 1 fall in 05/2013 on ice  Plan: Fall prevention plans: given info on falls  Educational resources provided: brochure Self management tools provided: home fall prevention checklist

## 2013-05-18 NOTE — Assessment & Plan Note (Signed)
Continue RICE Encouraged to wear brace Could also try headt  TAke Hydrocodone-acetaminophen for pain prn with additional tylenol if needed not to exceed 3 grams 3000 mg total Tylenol daily

## 2013-05-18 NOTE — Patient Instructions (Addendum)
General Instructions: Follow up in 06/2013  Get your MRI I made a referral to Dr. Glade Lloyd  Continue to rest, ice, compression (with brace) and elevate your ankle Take no more than 3 grams or 3000 mg of Tylenol    Treatment Goals:  Goals (1 Years of Data) as of 05/18/13         As of Today 05/07/13 05/07/13 05/07/13 05/07/13     Blood Pressure    . Blood Pressure < 140/90  128/77 113/70 125/70 122/58 146/85     Result Component    . HEMOGLOBIN A1C < 7.0            Progress Toward Treatment Goals:  Treatment Goal 05/18/2013  Hemoglobin A1C at goal  Blood pressure at goal  Prevent falls deteriorated    Self Care Goals & Plans:  Self Care Goal 05/18/2013  Manage my medications take my medicines as prescribed; bring my medications to every visit; refill my medications on time  Monitor my health keep track of my blood glucose; bring my glucose meter and log to each visit; keep track of my blood pressure  Eat healthy foods drink diet soda or water instead of juice or soda; eat more vegetables; eat foods that are low in salt; eat baked foods instead of fried foods; eat fruit for snacks and desserts  Be physically active -  Prevent falls -  Meeting treatment goals -    Home Blood Glucose Monitoring 05/18/2013  Check my blood sugar no home glucose monitoring  When to check my blood sugar N/A     Care Management & Community Referrals:  Referral 05/18/2013  Referrals made for care management support none needed  Referrals made to community resources -        Ankle Sprain An ankle sprain is an injury to the strong, fibrous tissues (ligaments) that hold the bones of your ankle joint together.  CAUSES An ankle sprain is usually caused by a fall or by twisting your ankle. Ankle sprains most commonly occur when you step on the outer edge of your foot, and your ankle turns inward. People who participate in sports are more prone to these types of injuries.  SYMPTOMS   Pain in your  ankle. The pain may be present at rest or only when you are trying to stand or walk.  Swelling.  Bruising. Bruising may develop immediately or within 1 to 2 days after your injury.  Difficulty standing or walking, particularly when turning corners or changing directions. DIAGNOSIS  Your caregiver will ask you details about your injury and perform a physical exam of your ankle to determine if you have an ankle sprain. During the physical exam, your caregiver will press on and apply pressure to specific areas of your foot and ankle. Your caregiver will try to move your ankle in certain ways. An X-ray exam may be done to be sure a bone was not broken or a ligament did not separate from one of the bones in your ankle (avulsion fracture).  TREATMENT  Certain types of braces can help stabilize your ankle. Your caregiver can make a recommendation for this. Your caregiver may recommend the use of medicine for pain. If your sprain is severe, your caregiver may refer you to a surgeon who helps to restore function to parts of your skeletal system (orthopedist) or a physical therapist. [place] ice to your injury for 1 2 days or as directed by your caregiver.  Applying ice helps to reduce inflammation and pain.  Put ice in a plastic bag.  Place a towel between your skin and the bag.  Leave the ice on for 15-20 minutes at a time, every 2 hours while you are awake.  Only take over-the-counter or prescription medicines for pain, discomfort, or fever as directed by your caregiver.  Elevate your injured ankle above the level of your heart as much as possible for 2 3 days.  If your caregiver recommends crutches, use them as instructed. Gradually put weight on the affected ankle. Continue to use crutches or a cane until you can walk without feeling pain in your ankle.  If you have a plaster splint, wear the splint as directed by your caregiver. Do not rest it on anything harder than a  pillow for the first 24 hours. Do not put weight on it. Do not get it wet. You may take it off to take a shower or bath.  You may have been given an elastic bandage to wear around your ankle to provide support. If the elastic bandage is too tight (you have numbness or tingling in your foot or your foot becomes cold and blue), adjust the bandage to make it comfortable.  If you have an air splint, you may blow more air into it or let air out to make it more comfortable. You may take your splint off at night and before taking a shower or bath. Wiggle your toes in the splint several times per day to decrease swelling. SEEK MEDICAL CARE IF:   You have rapidly increasing bruising or swelling.  Your toes feel extremely cold or you lose feeling in your foot.  Your pain is not relieved with medicine. SEEK IMMEDIATE MEDICAL CARE IF:  Your toes are numb or blue.  You have severe pain that is increasing. MAKE SURE YOU:   Understand these instructions.  Will watch your condition.  Will get help right away if you are not doing well or get worse. Document Released: 02/23/2005 Document Revised: 11/18/2011 Document Reviewed: 03/07/2011 [facility] Patient Information 2014 [place], [place].  Fall Prevention and Home Safety Falls cause injuries and can affect all age groups. It is possible to use preventive measures to significantly decrease the likelihood of falls. There are many simple measures which can make your home safer and prevent falls. OUTDOORS  Repair cracks and edges of walkways and driveways.  Remove high doorway thresholds.  Trim shrubbery on the main path into your home.  Have good outside lighting.  Clear walkways of tools, rocks, debris, and clutter.  Check that handrails are not broken and are securely fastened. Both sides of steps should have handrails.  Have leaves, snow, and ice cleared regularly.  Use sand or salt on walkways during winter months.  In the garage, clean  up grease or oil spills. BATHROOM  Install night lights.  Install grab bars by the toilet and in the tub and shower.  Use non-skid mats or decals in the tub or shower.  Place a plastic non-slip stool in the shower to sit on, if needed.  Keep floors dry and clean up all water on the floor immediately.  Remove soap buildup in the tub or shower on a regular basis.  Secure bath mats with non-slip, double-sided rug tape.  Remove throw rugs and tripping hazards from the floors. BEDROOMS  Install night lights.  Make sure a bedside light is easy to reach.  Do not use oversized bedding.  Keep  a telephone by your bedside.  Have a firm chair with side arms to use for getting dressed.  Remove throw rugs and tripping hazards from the floor. KITCHEN  Keep handles on pots and pans turned toward the center of the stove. Use back burners when possible.  Clean up spills quickly and allow time for drying.  Avoid walking on wet floors.  Avoid hot utensils and knives.  Position shelves so they are not too high or low.  Place commonly used objects within easy reach.  If necessary, use a sturdy step stool with a grab bar when reaching.  Keep electrical cables out of the way.  Do not use floor polish or wax that makes floors slippery. If you must use wax, use non-skid floor wax.  Remove throw rugs and tripping hazards from the floor. STAIRWAYS  Never leave objects on stairs.  Place handrails on both sides of stairways and use them. Fix any loose handrails. Make sure handrails on both sides of the stairways are as long as the stairs.  Check carpeting to make sure it is firmly attached along stairs. Make repairs to worn or loose carpet promptly.  Avoid placing throw rugs at the top or bottom of stairways, or properly secure the rug with carpet tape to prevent slippage. Get rid of throw rugs, if possible.  Have an electrician put in a light switch at the top and bottom of the  stairs. OTHER FALL PREVENTION TIPS  Wear low-heel or rubber-soled shoes that are supportive and fit well. Wear closed toe shoes.  When using a stepladder, make sure it is fully opened and both spreaders are firmly locked. Do not climb a closed stepladder.  Add color or contrast paint or tape to grab bars and handrails in your home. Place contrasting color strips on first and last steps.  Learn and use mobility aids as needed. Install an electrical emergency response system.  Turn on lights to avoid dark areas. Replace light bulbs that burn out immediately. Get light switches that glow.  Arrange furniture to create clear pathways. Keep furniture in the same place.  Firmly attach carpet with non-skid or double-sided tape.  Eliminate uneven floor surfaces.  Select a carpet pattern that does not visually hide the edge of steps.  Be aware of all pets. OTHER HOME SAFETY TIPS  Set the water temperature for 120 F (48.8 C).  Keep emergency numbers on or near the telephone.  Keep smoke detectors on every level of the home and near sleeping areas. Document Released: 02/13/2002 Document Revised: 08/25/2011 Document Reviewed: 05/15/2011 [facility] Patient Information 2014 [place].  Upper Respiratory Infection, Adult An upper respiratory infection (URI) is also known as the common cold. It is often caused by a type of germ (virus). Colds are easily spread (contagious). You can pass it to others by kissing, coughing, sneezing, or drinking out of the same glass. Usually, you get better in 1 or 2 weeks.  HOME CARE   Only take medicine as told by your doctor.  Use a warm mist humidifier or breathe in steam from a hot shower.  Drink enough water and fluids to keep your pee (urine) clear or pale yellow.  Get plenty of rest.  Return to work when your temperature is back to normal or as told by your doctor. You may use a face mask and wash your hands to stop your cold from  spreading. GET HELP RIGHT AWAY IF:   After the first few days, you  feel you are getting worse.  You have questions about your medicine.  You have chills, shortness of breath, or brown or red spit (mucus).  You have yellow or brown snot (nasal discharge) or pain in the face, especially when you bend forward.  You have a fever, puffy (swollen) neck, pain when you swallow, or white spots in the back of your throat.  You have a bad headache, ear pain, sinus pain, or chest pain.  You have a high-pitched whistling sound when you breathe in and out (wheezing).  You have a lasting cough or cough up blood.  You have sore muscles or a stiff neck. MAKE SURE YOU:   Understand these instructions.  Will watch your condition.  Will get help right away if you are not doing well or get worse. Document Released: 08/12/2007 Document Revised: 05/18/2011 Document Reviewed: 06/30/2010 [facility] Patient Information 2014 [place], [place].   Type 2 Diabetes Mellitus, Adult Type 2 diabetes mellitus is a long-term (chronic) disease. In type 2 diabetes:  The pancreas does not make enough of a hormone called insulin.  The cells in the body do not respond as well to the insulin that is made.  Both of the above can happen. Normally, insulin moves sugars from food into tissue cells. This gives you energy. If you have type 2 diabetes, sugars cannot be moved into tissue cells. This causes high blood sugar (hyperglycemia).  HOME CARE  Have your hemoglobin A1c level checked twice a year. The level shows if your diabetes is under control or out of control.  Perform daily blood sugar testing as told by your doctor.  Check your ketone levels by testing your pee (urine) when you are sick and as told.  Take your diabetes or insulin medicine as told by your doctor.  Never run out of insulin.  Adjust how much insulin you give yourself based on how many carbs (carbohydrates) you eat. Carbs are in many foods,  such as fruits, vegetables, whole grains, and dairy products.  Have a healthy snack between every healthy meal. Have 3 meals and 3 snacks a day.  Lose weight if you are overweight.  Carry a medical alert card or wear your medical alert jewelry.  Carry a 15 gram carb snack with you at all times. Examples include:  Glucose pills, 3 or 4.  Glucose gel, 15 gram tube.  Raisins, 2 tablespoons (24 grams).  Jelly beans, 6.  Animal crackers, 8.  Sugar pop, 4 ounces (120 milliliters).  Gummy treats, 9.  Notice low blood sugar (hypoglycemia) symptoms, such as:  Shaking (tremors).  Decreased ability to think clearly.  Sweating.  Increased heart rate.  Headache.  Dry mouth.  Hunger.  Crabbiness (irritability).  Being worried or tense (anxiety).  Restless sleep.  A change in speech or coordination.  Confusion.  Treat low blood sugar right away. If you are alert and can swallow, follow the 15:15 rule:  Take 15 20 grams of a rapid-acting glucose or carb. This includes glucose gel, glucose pills, or 4 ounces (120 milliliters) of fruit juice, regular pop, or low-fat milk.  Check your blood sugar level after taking the glucose.  Take 15 20 grams of more glucose if the repeat blood sugar level is still 70 mg/dL (milligrams/deciliter) or below.  Eat a meal or snack within 1 hour of the blood sugar levels going back to normal.  Notice early symptoms of high blood sugar, such as:  Being really thirsty or drinking a lot (polydipsia).  Peeing (urinating) a lot (polyuria).  Do at least 150 minutes of physical activity a week or as told.  Split the 150 minutes of activity up during the week. Do not do 150 minutes of activity in one day.  Perform exercises, such as weight lifting, at least 2 times a week or as told.  Adjust your insulin or food intake as needed if you start a new exercise or sport.  Follow your sick day plan when you are not able to eat or drink as  usual.  Avoid tobacco use.  Women who are not pregnant should drink no more than 1 drink a day. Men should drink no more than 2 drinks a day.  Only drink alcohol with food.  Ask your doctor if alcohol is safe for you.  Tell your doctor if you drink alcohol several times during the week.  See your doctor regularly.  Schedule an eye exam soon after you are diagnosed with diabetes. Schedule exams once every year.  Check your skin and feet every day. Check for cuts, bruises, redness, nail problems, bleeding, blisters, or sores. A doctor should do a foot exam once a year.  Brush your teeth and gums twice a day. Floss once a day. Visit your dentist regularly.  Share your diabetes plan with your workplace or school.  Stay up-to-date with shots that fight against diseases (immunizations).  Learn how to manage stress.  Get diabetes education and support as needed.  Ask your doctor for special help if:  You need help to maintain or improve how you to do things on your own.  You need help to maintain or improve the quality of your life.  You have foot or hand problems.  You have trouble cleaning yourself, dressing, eating, or doing physical activity. GET HELP RIGHT AWAY IF:  You have trouble breathing.  You have moderate to large ketone levels.  You are unable to eat food or drink fluids for more than 6 hours.  You feel sick to your stomach (nauseous) or throw up (vomit) for more than 6 hours.  Your blood sugar level is over 240 mg/dL.  There is a change in mental status.  You get another serious illness.  You have watery poop (diarrhea) for more than 6 hours.  You have been sick or have had a fever for 2 or more days and are not getting better.  You have pain when you are physically active. MAKE SURE YOU:  Understand these instructions.  Will watch your condition.  Will get help right away if you are not doing well or get worse. Document Released: 12/03/2007  Document Revised: 12/14/2012 Document Reviewed: 06/24/2012 [facility] Patient Information 2014 [place], [place].  Hypertension As your heart beats, it forces blood through your arteries. This force is your blood pressure. If the pressure is too high, it is called hypertension (HTN) or high blood pressure. HTN is dangerous because you may have it and not know it. High blood pressure may mean that your heart has to work harder to pump blood. Your arteries may be narrow or stiff. The extra work puts you at risk for heart disease, stroke, and other problems.  Blood pressure consists of two numbers, a higher number over a lower, 110/72, for example. It is stated as "110 over 72." The ideal is below 120 for the top number (systolic) and under 80 for the bottom (diastolic). Write down your blood pressure today. You should pay close attention to your blood pressure if  you have certain conditions such as:  Heart failure.  Prior heart attack.  Diabetes  Chronic kidney disease.  Prior stroke.  Multiple risk factors for heart disease. To see if you have HTN, your blood pressure should be measured while you are seated with your arm held at the level of the heart. It should be measured at least twice. A one-time elevated blood pressure reading (especially in the Emergency Department) does not mean that you need treatment. There may be conditions in which the blood pressure is different between your right and left arms. It is important to see your caregiver soon for a recheck. Most people have essential hypertension which means that there is not a specific cause. This type of high blood pressure may be lowered by changing lifestyle factors such as:  Stress.  Smoking.  Lack of exercise.  Excessive weight.  Drug/tobacco/alcohol use.  Eating less salt. Most people do not have symptoms from high blood pressure until it has caused damage to the body. Effective treatment can often prevent, delay or reduce  that damage. TREATMENT  When a cause has been identified, treatment for high blood pressure is directed at the cause. There are a large number of medications to treat HTN. These fall into several categories, and your caregiver will help you select the medicines that are best for you. Medications may have side effects. You should review side effects with your caregiver. If your blood pressure stays high after you have made lifestyle changes or started on medicines,   Your medication(s) may need to be changed.  Other problems may need to be addressed.  Be certain you understand your prescriptions, and know how and when to take your medicine.  Be sure to follow up with your caregiver within the time frame advised (usually within two weeks) to have your blood pressure rechecked and to review your medications.  If you are taking more than one medicine to lower your blood pressure, make sure you know how and at what times they should be taken. Taking two medicines at the same time can result in blood pressure that is too low. SEEK IMMEDIATE MEDICAL CARE IF:  You develop a severe headache, blurred or changing vision, or confusion.  You have unusual weakness or numbness, or a faint feeling.  You have severe chest or abdominal pain, vomiting, or breathing problems. MAKE SURE YOU:   Understand these instructions.  Will watch your condition.  Will get help right away if you are not doing well or get worse. Document Released: 02/23/2005 Document Revised: 05/18/2011 Document Reviewed: 10/14/2007 [facility] Patient Information 2014 [place].  DASH Diet The DASH diet stands for "Dietary Approaches to Stop Hypertension." It is a healthy eating plan that has been shown to reduce high blood pressure (hypertension) in as little as 14 days, while also possibly providing other significant health benefits. These other health benefits include reducing the risk of breast cancer after menopause and  reducing the risk of type 2 diabetes, heart disease, colon cancer, and stroke. Health benefits also include weight loss and slowing kidney failure in patients with chronic kidney disease.  DIET GUIDELINES  Limit salt (sodium). Your diet should contain less than 1500 mg of sodium daily.  Limit refined or processed carbohydrates. Your diet should include mostly whole grains. Desserts and added sugars should be used sparingly.  Include small amounts of heart-healthy fats. These types of fats include nuts, oils, and tub margarine. Limit saturated and trans fats. These fats have been  shown to be harmful in the body. CHOOSING FOODS  The following food groups are based on a 2000 calorie diet. See your Registered Dietitian for individual calorie needs. Grains and Grain Products (6 to 8 servings daily)  Eat More Often: Whole-wheat bread, brown rice, whole-grain or wheat pasta, quinoa, popcorn without added fat or salt (air popped).  Eat Less Often: White bread, white pasta, white rice, cornbread. Vegetables (4 to 5 servings daily)  Eat More Often: Fresh, frozen, and canned vegetables. Vegetables may be raw, steamed, roasted, or grilled with a minimal amount of fat.  Eat Less Often/Avoid: Creamed or fried vegetables. Vegetables in a cheese sauce. Fruit (4 to 5 servings daily)  Eat More Often: All fresh, canned (in natural juice), or frozen fruits. Dried fruits without added sugar. One hundred percent fruit juice ( cup [237 mL] daily).  Eat Less Often: Dried fruits with added sugar. Canned fruit in light or heavy syrup. [employer], Fish, and Poultry (2 servings or less daily. One serving is 3 to 4 oz [85-114 g]).  Eat More Often: Ninety percent or leaner ground beef, tenderloin, sirloin. Round cuts of beef, chicken breast, [place] breast. All fish. Grill, bake, or broil your meat. Nothing should be fried.  Eat Less Often/Avoid: Fatty cuts of meat, [place], or chicken leg, thigh, or wing. Fried cuts  of meat or fish. Dairy (2 to 3 servings)  Eat More Often: Low-fat or fat-free milk, low-fat plain or light yogurt, reduced-fat or part-skim cheese.  Eat Less Often/Avoid: Milk (whole, 2%).Whole milk yogurt. Full-fat cheeses. Nuts, Seeds, and Legumes (4 to 5 servings per week)  Eat More Often: All without added salt.  Eat Less Often/Avoid: Salted nuts and seeds, canned beans with added salt. Fats and Sweets (limited)  Eat More Often: Vegetable oils, tub margarines without trans fats, sugar-free gelatin. Mayonnaise and salad dressings.  Eat Less Often/Avoid: Coconut oils, palm oils, butter, stick margarine, cream, half and half, cookies, candy, pie. FOR MORE INFORMATION The Dash Diet Eating Plan: www.dashdiet.org Document Released: 02/12/2011 Document Revised: 05/18/2011 Document Reviewed: 02/12/2011 [facility] Patient Information 2014 [place], [place].

## 2013-05-18 NOTE — Assessment & Plan Note (Signed)
Improving Continue symptomatic treatment  Return if worsening

## 2013-05-18 NOTE — Assessment & Plan Note (Signed)
Lab Results  Component Value Date   HGBA1C 6.6 04/03/2013   HGBA1C 6.1 01/12/2013   HGBA1C 6.0 07/25/2012     Assessment: Diabetes control: good control (HgbA1C at goal) Progress toward A1C goal:  at goal Comments: none  Plan: Medications:  continue current medications Home glucose monitoring: Frequency: no home glucose monitoring Timing: N/A Instruction/counseling given: reminded to get eye exam Educational resources provided: brochure Self management tools provided: other (see comments) Other plans: f/u in 5 or 08/2013, repeat HA1C at that time, due for eye MD appt pt will call

## 2013-05-19 ENCOUNTER — Ambulatory Visit (INDEPENDENT_AMBULATORY_CARE_PROVIDER_SITE_OTHER): Payer: Medicare Other | Admitting: *Deleted

## 2013-05-19 DIAGNOSIS — Z5181 Encounter for therapeutic drug level monitoring: Secondary | ICD-10-CM

## 2013-05-19 DIAGNOSIS — Z7901 Long term (current) use of anticoagulants: Secondary | ICD-10-CM

## 2013-05-19 DIAGNOSIS — I4891 Unspecified atrial fibrillation: Secondary | ICD-10-CM

## 2013-05-19 DIAGNOSIS — G459 Transient cerebral ischemic attack, unspecified: Secondary | ICD-10-CM

## 2013-05-19 LAB — POCT INR: INR: 2.5

## 2013-05-22 NOTE — Progress Notes (Signed)
Case discussed with Dr. McLean at the time of the visit.  We reviewed the resident's history and exam and pertinent patient test results.  I agree with the assessment, diagnosis, and plan of care documented in the resident's note.     

## 2013-05-26 IMAGING — DX DG ABD PORTABLE 2V
2 series · 2 of 2 positions shown · non-contrast
Comparison: 07/31/2011

CLINICAL DATA: Abdominal pain and distention.  Recent laparoscopic
lysis of adhesions and hernia repair on 07/29/2011

PORTABLE ABDOMEN - 2 VIEW

[AP]
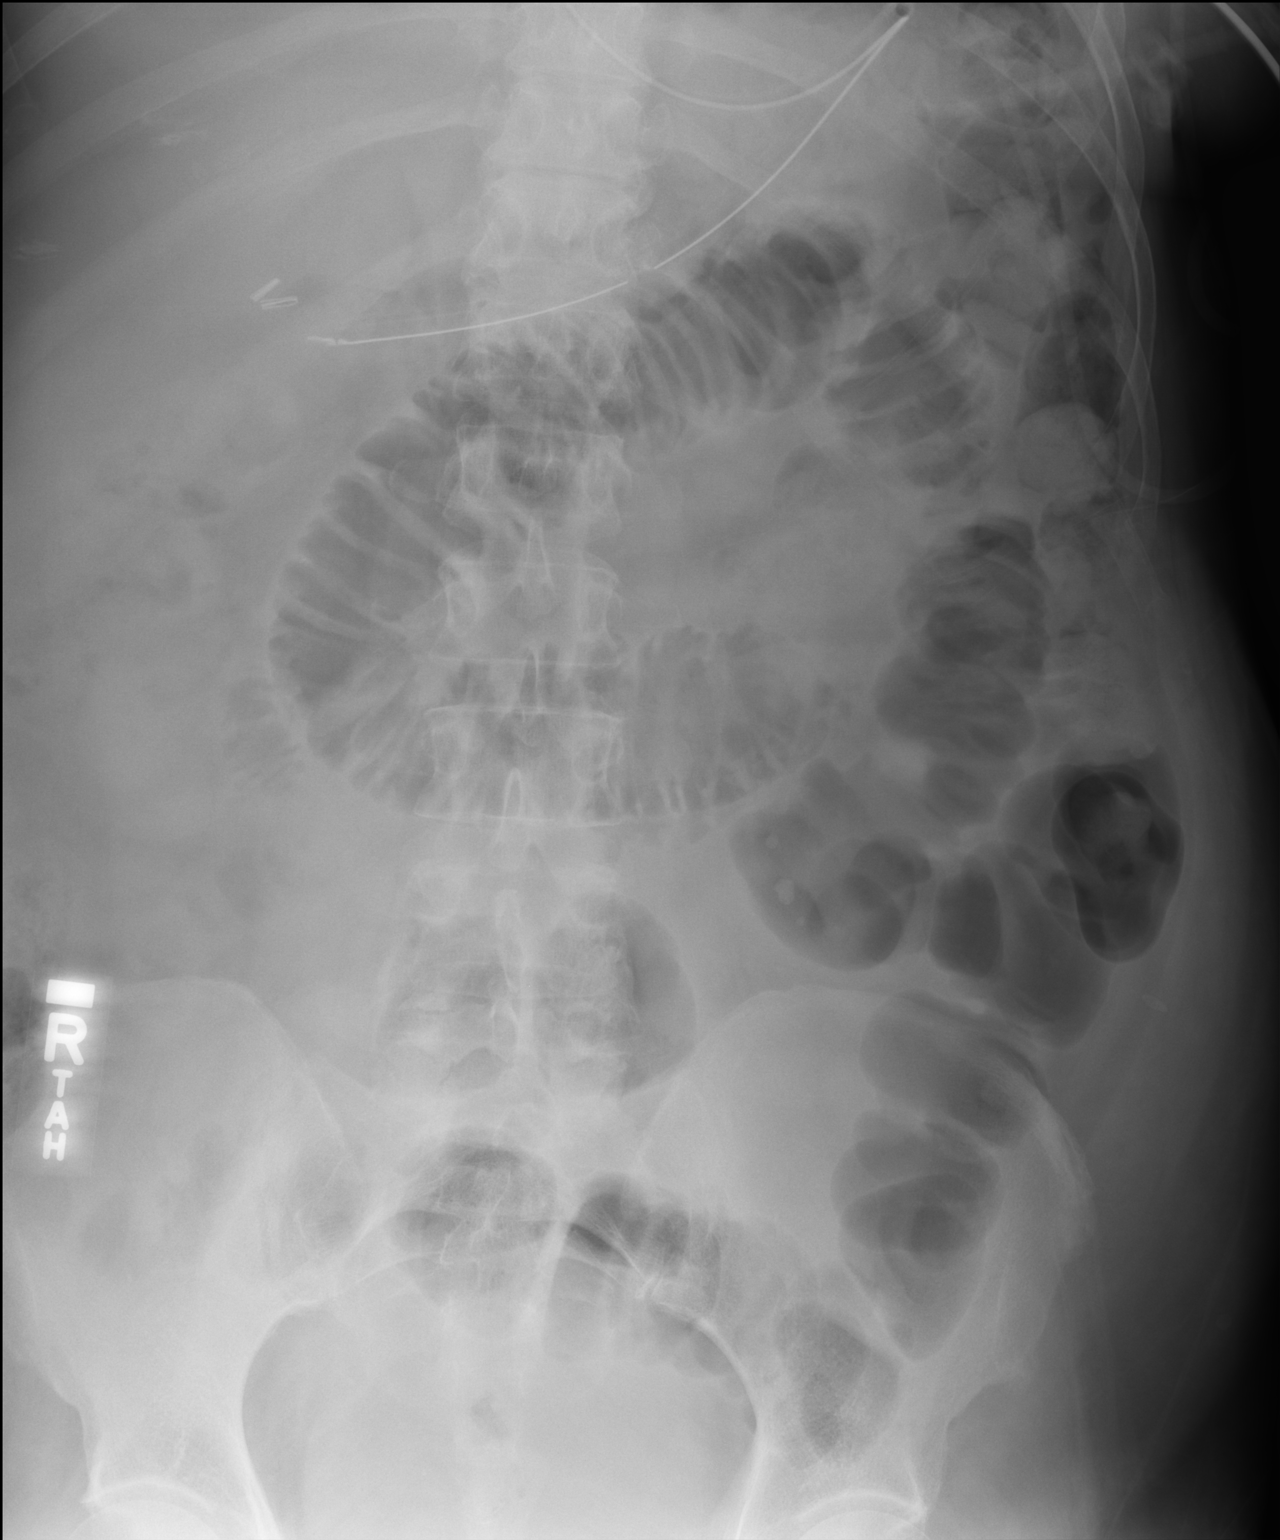

[ap lld]
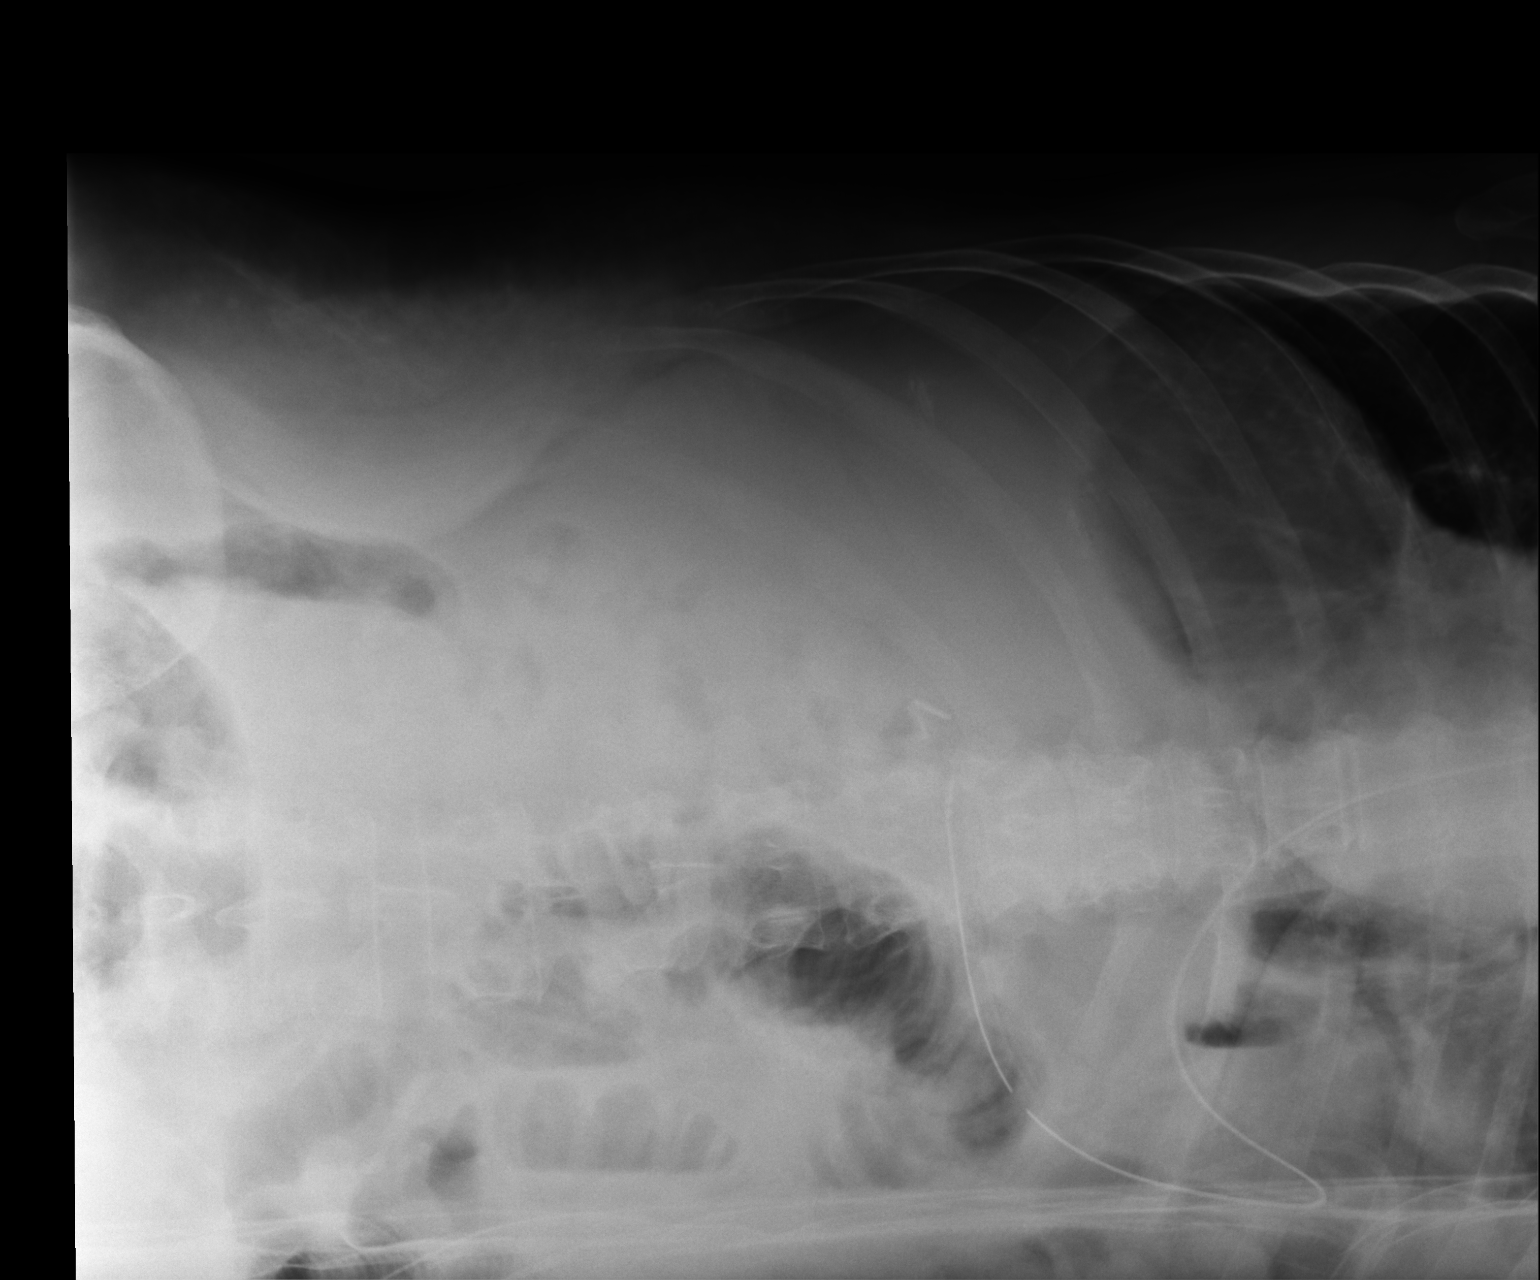

[2 of 2 positions shown; findings below may reference images not displayed]

FINDINGS: NG tube tip is in the distal stomach.  No visible free
air on the left lateral decubitus view.  There is a single dilated
loop of small bowel in the mid abdomen.  There is air in the
nondistended colon.

Since the prior study the gaseous distention of the stomach has
resolved and there is decreased air throughout the bowel since the
prior study.
IMPRESSION: Overall improved appearance of the abdomen.  There is a single
prominent dilated loop of small bowel in the midabdomen persisting.

## 2013-05-31 NOTE — Addendum Note (Signed)
Addended by: Hester Mates on: 05/31/2013 08:54 AM   Modules accepted: Orders

## 2013-06-01 ENCOUNTER — Ambulatory Visit (HOSPITAL_COMMUNITY)
Admission: RE | Admit: 2013-06-01 | Discharge: 2013-06-01 | Disposition: A | Discharge status: Home / Self Care | Payer: Medicare Other | Source: Ambulatory Visit | Attending: Internal Medicine | Admitting: Internal Medicine

## 2013-06-01 DIAGNOSIS — G8929 Other chronic pain: Secondary | ICD-10-CM | POA: Diagnosis not present

## 2013-06-01 DIAGNOSIS — M47817 Spondylosis without myelopathy or radiculopathy, lumbosacral region: Secondary | ICD-10-CM | POA: Insufficient documentation

## 2013-06-01 DIAGNOSIS — M545 Low back pain, unspecified: Secondary | ICD-10-CM | POA: Diagnosis not present

## 2013-06-01 DIAGNOSIS — M51379 Other intervertebral disc degeneration, lumbosacral region without mention of lumbar back pain or lower extremity pain: Secondary | ICD-10-CM | POA: Diagnosis not present

## 2013-06-01 DIAGNOSIS — M5137 Other intervertebral disc degeneration, lumbosacral region: Secondary | ICD-10-CM | POA: Diagnosis not present

## 2013-06-01 DIAGNOSIS — M549 Dorsalgia, unspecified: Secondary | ICD-10-CM

## 2013-06-01 IMAGING — CR DG ABDOMEN 2V
2 series · 2 of 2 positions shown · non-contrast
Comparison: CT abdomen pelvis 08/06/2011.  Plain films the abdomen
08/04/2011 and 08/06/2011.

CLINICAL DATA: Postoperative ileus.  Gastric distention.

ABDOMEN - 2 VIEW

[w abdomen upright]
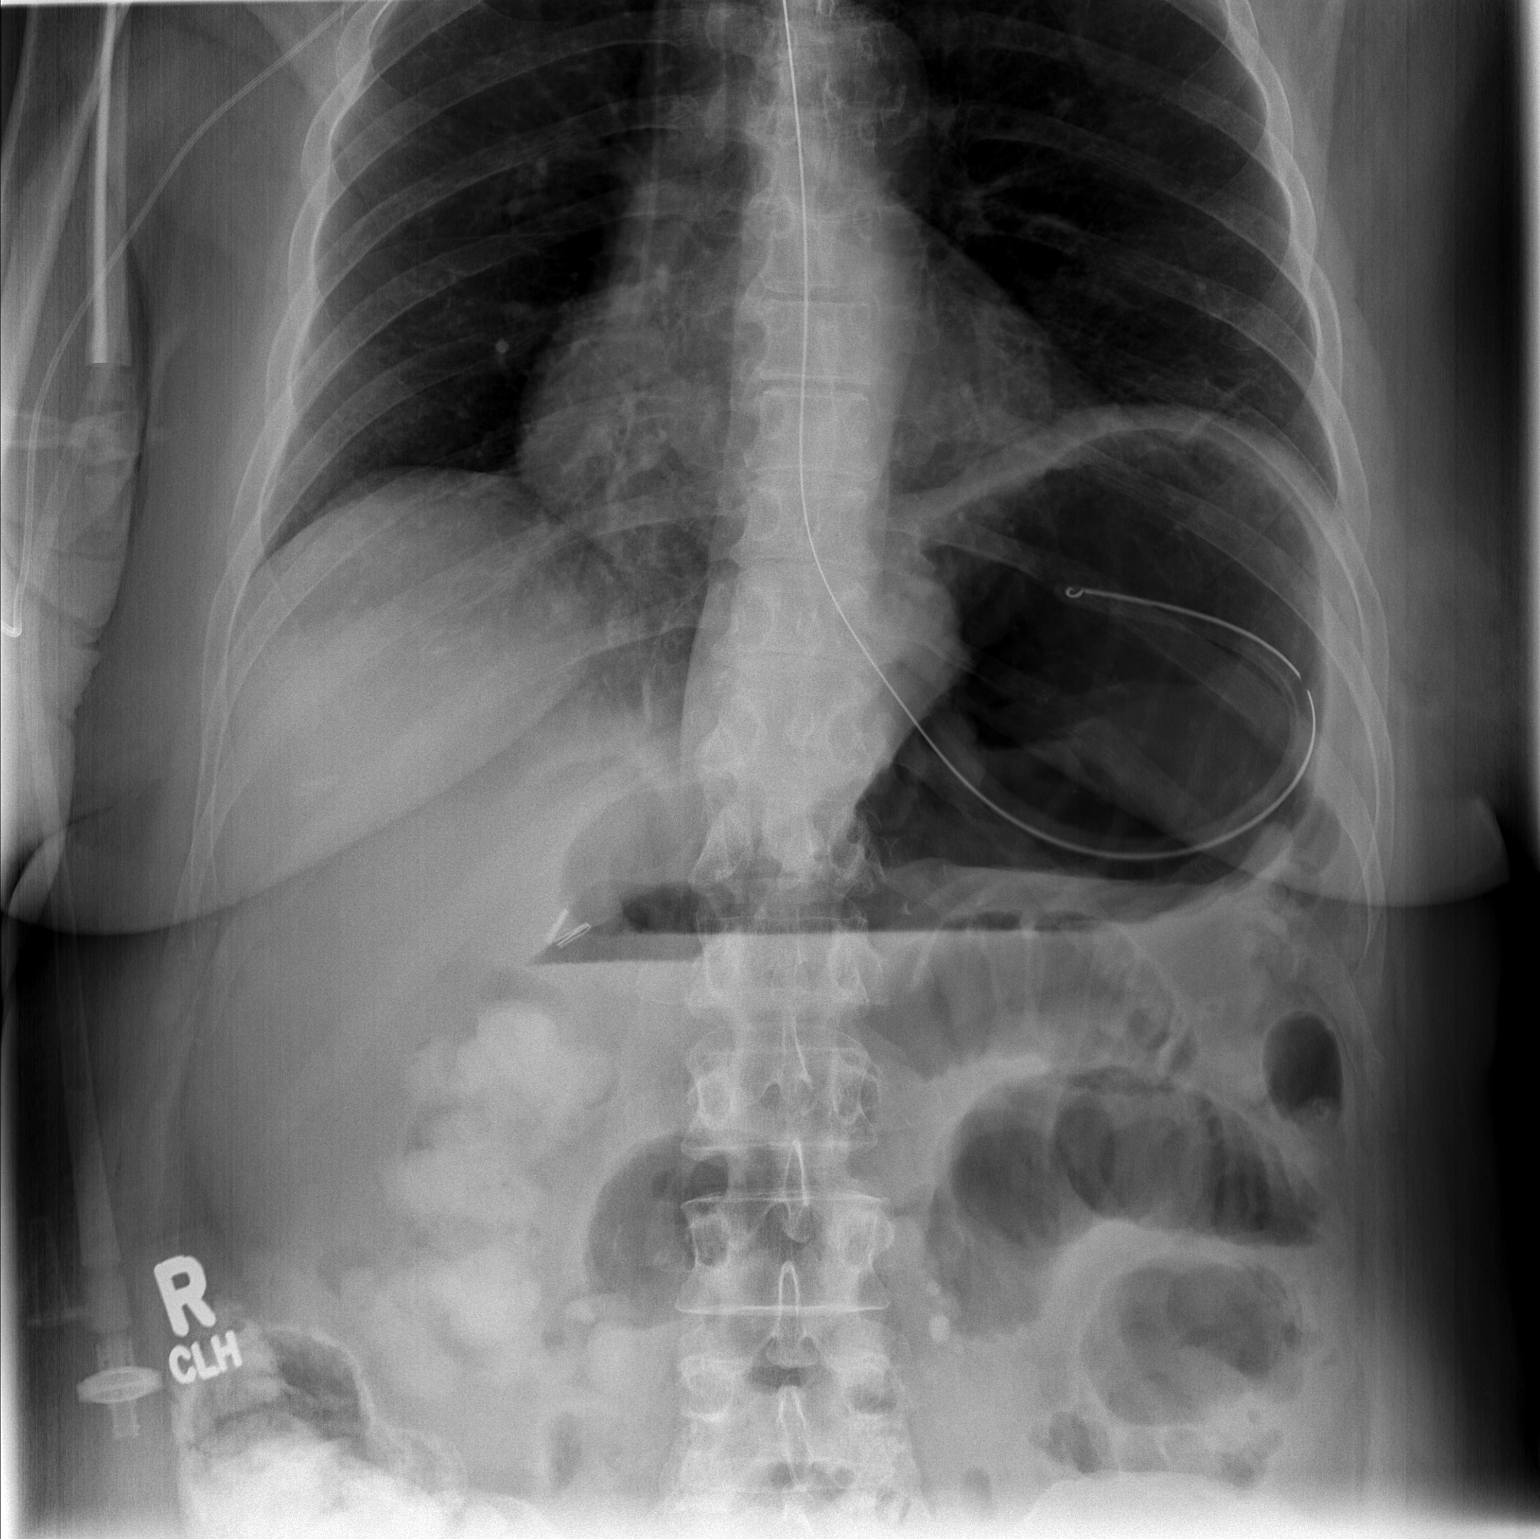

[t abdomen supine]
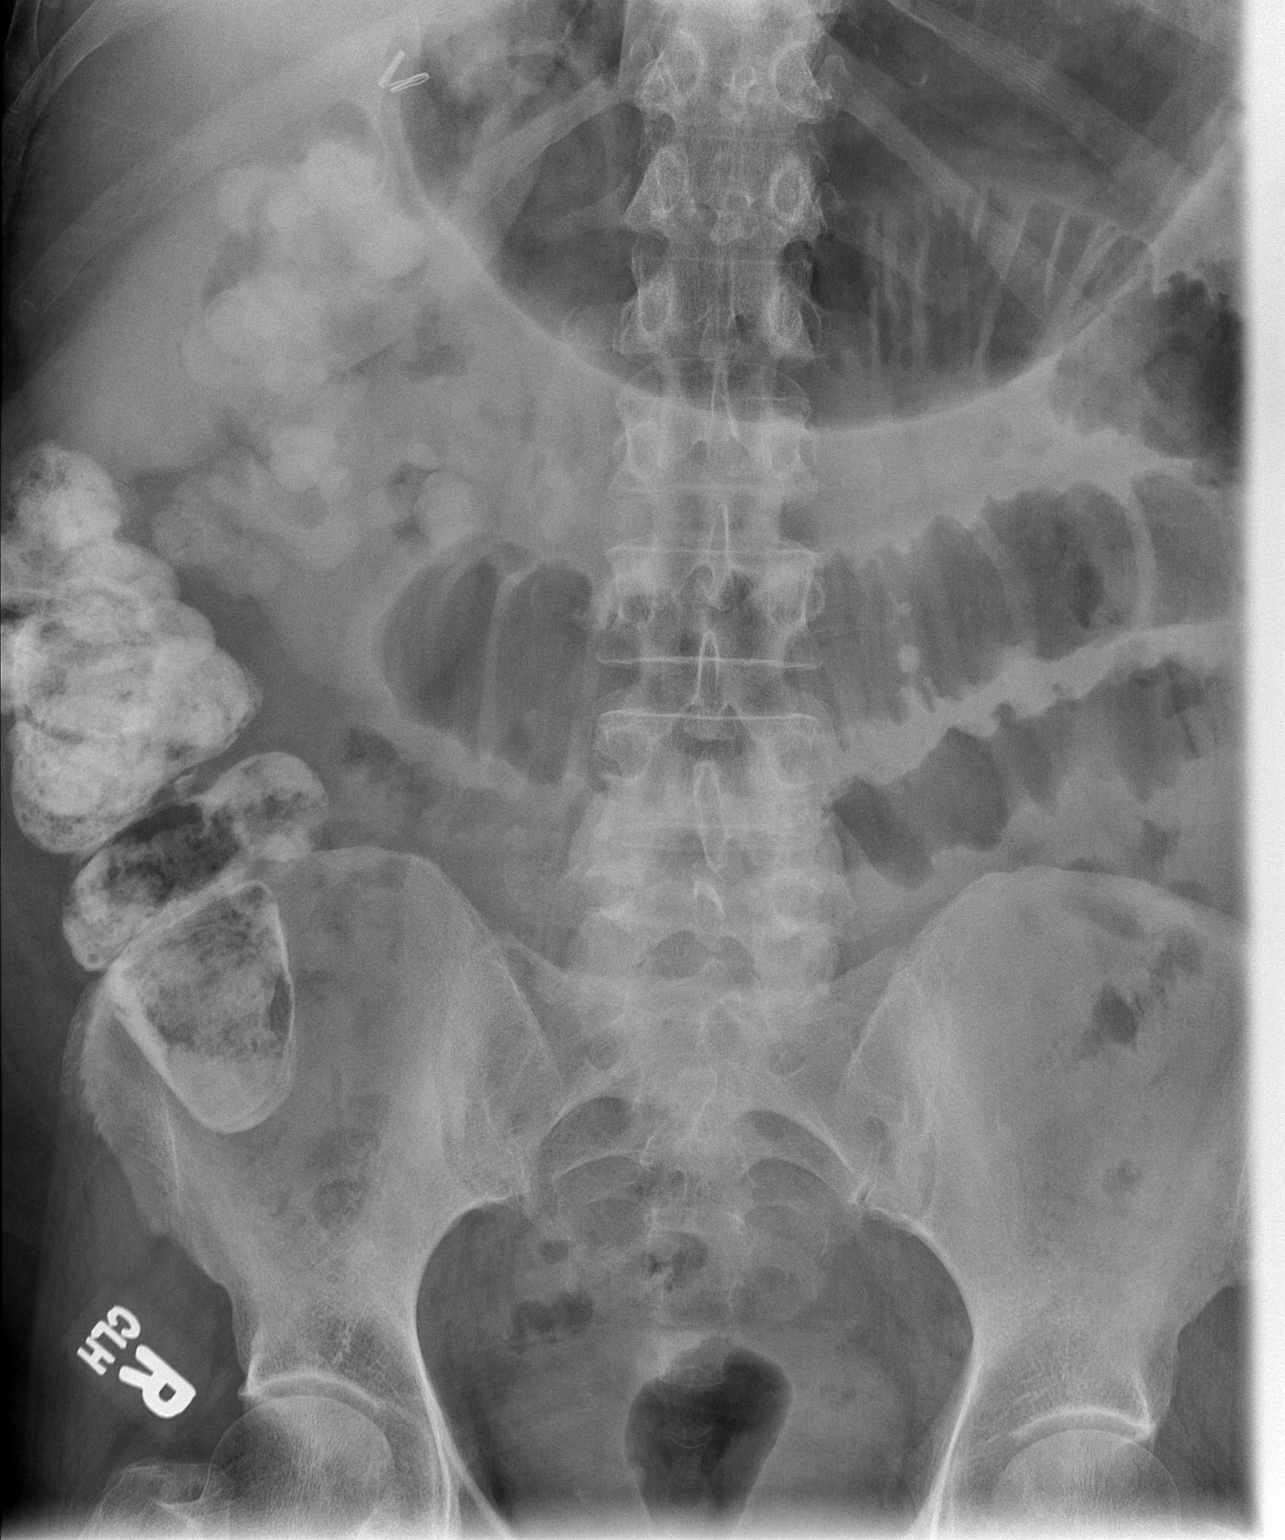

[2 of 2 positions shown; findings below may reference images not displayed]

FINDINGS: NG tube is in place.  Distention of the stomach persists
but appears improved.  There is persistent gaseous distention of
small bowel loops measuring up to 6.1 cm, slightly improved.
Contrast material from the patient's CT scan is now visualized in
the colon.  No free intraperitoneal air is identified.
IMPRESSION: 1.  Mild improvement in small bowel dilatation compatible with
improved ileus or obstruction.  Contrast material is now seen the
colon.
2.  Some improvement gaseous distention of the stomach.
3.  No free peritoneal air.

## 2013-06-02 IMAGING — CR DG ABDOMEN 2V
2 series · 2 of 2 positions shown · non-contrast
Comparison: 08/08/2011

CLINICAL DATA: Small bowel obstruction versus ileus

ABDOMEN - 2 VIEW

[w abdomen upright]
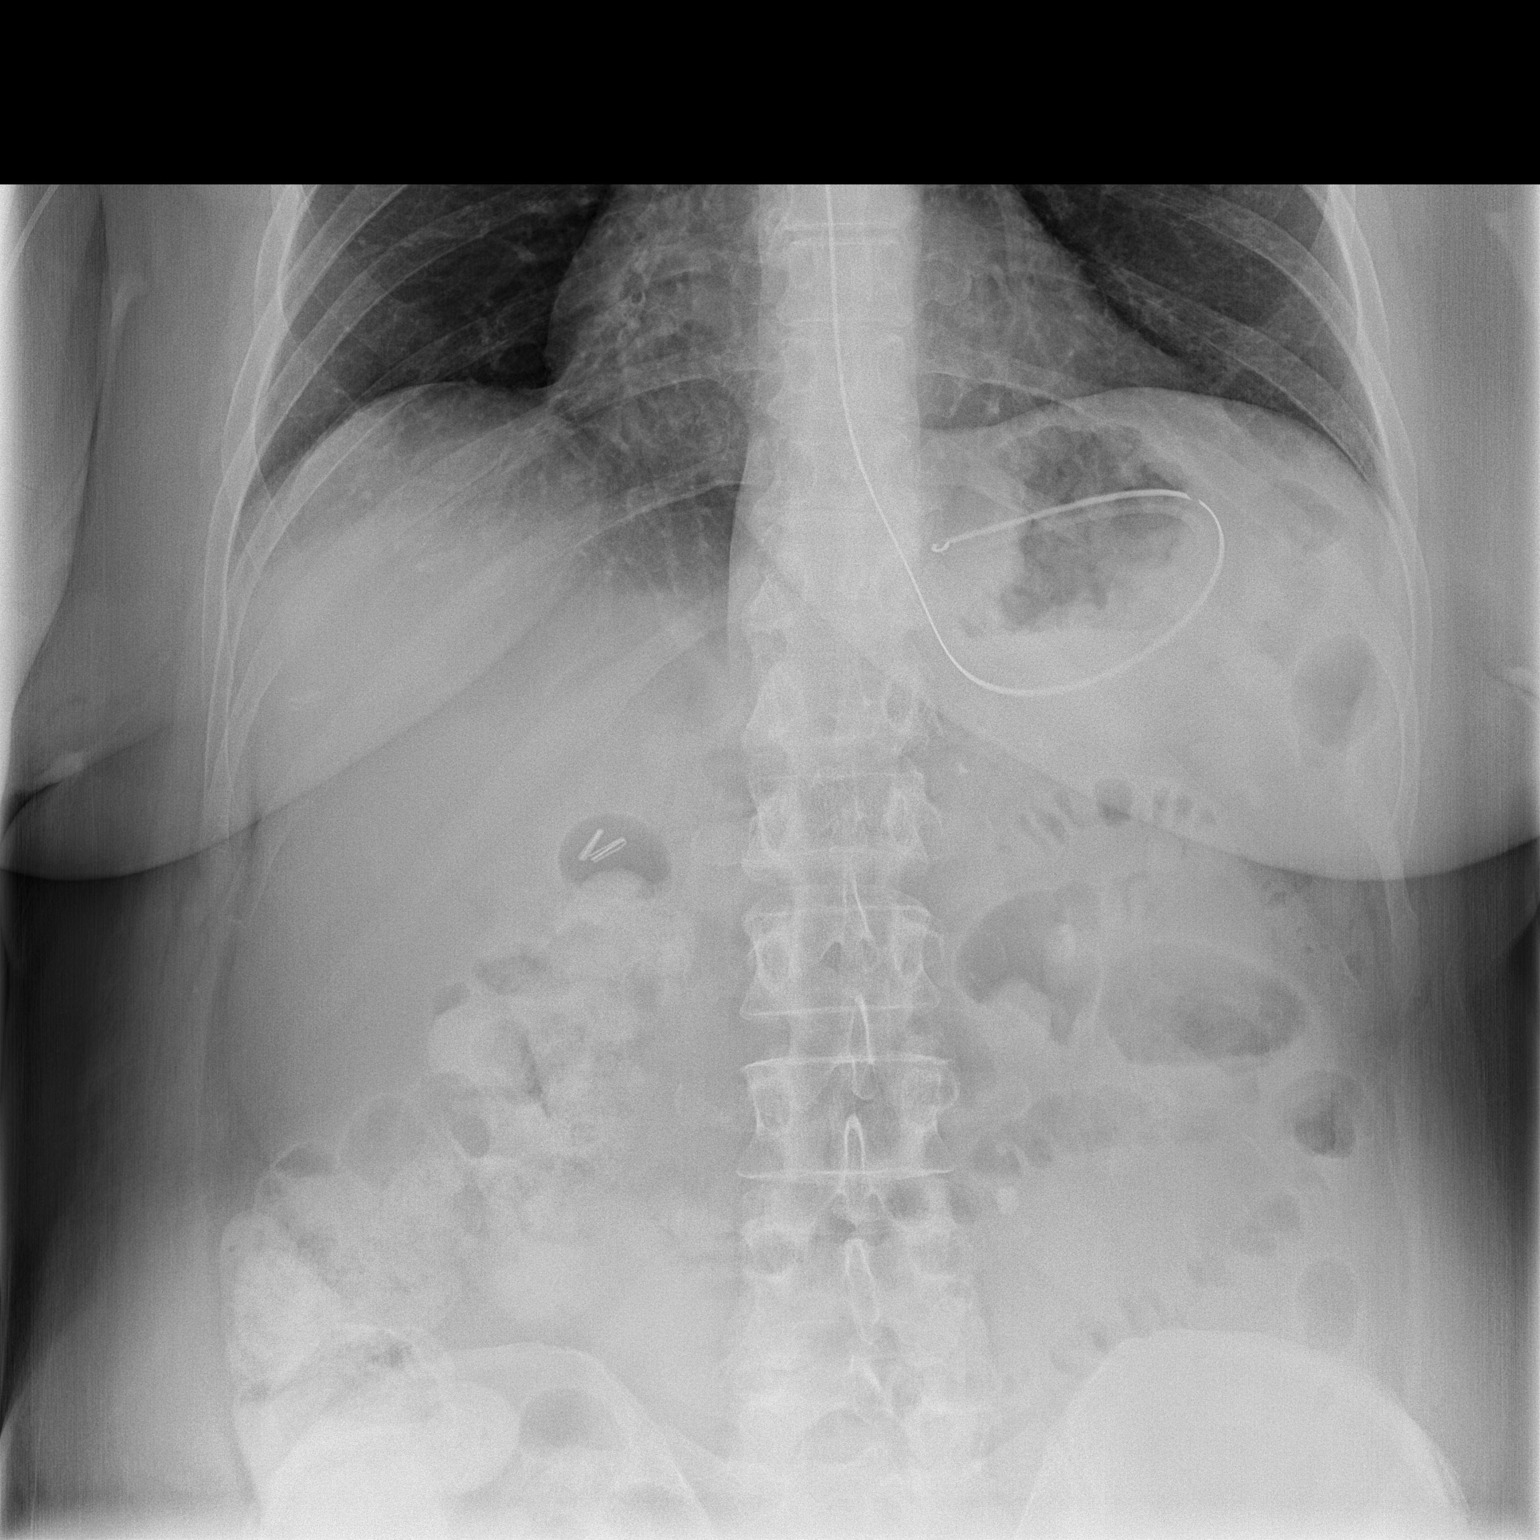

[t abdomen supine]
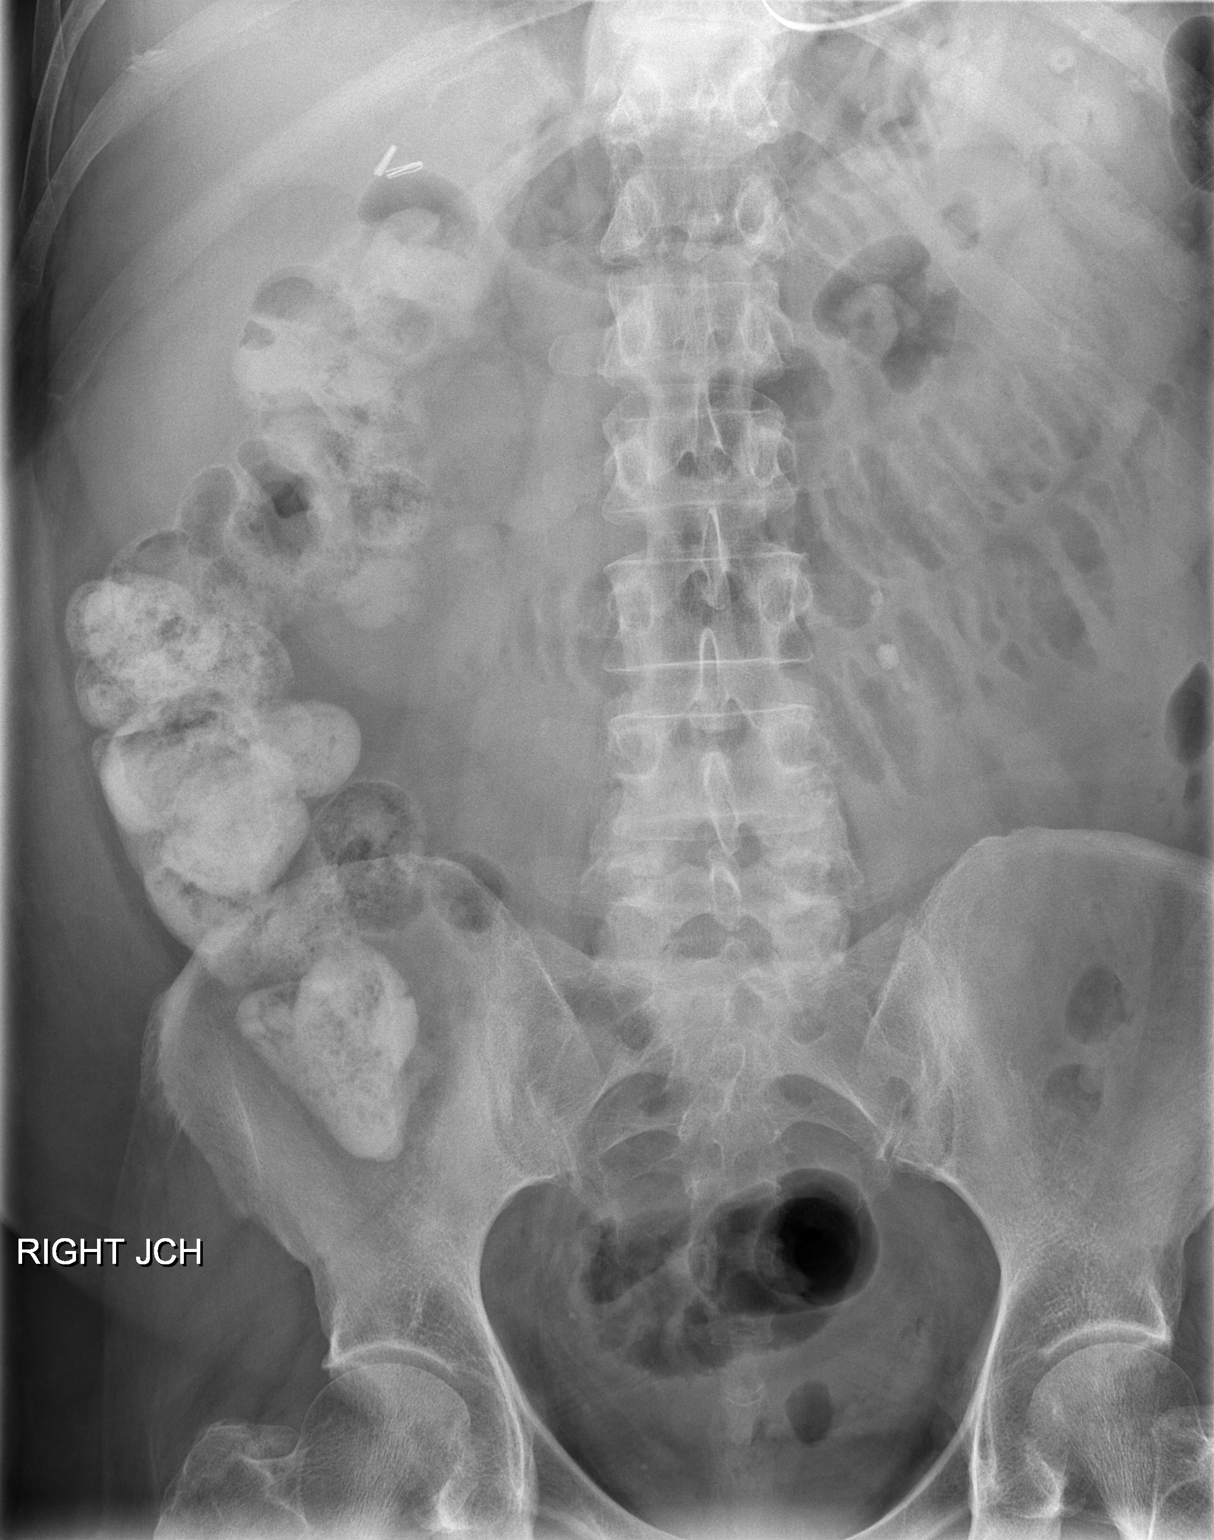

[2 of 2 positions shown; findings below may reference images not displayed]

FINDINGS: Cholecystectomy clips noted within the right upper
quadrant the abdomen.  There is a nasogastric tube which is coiled
in the stomach.  Interval decompression of the gastric lumen and
improvement in small bowel distention.    Enteric contrast material
is again noted within the right colon.
IMPRESSION: 1. Improving small bowel obstruction pattern.

## 2013-06-06 ENCOUNTER — Encounter: Payer: Self-pay | Admitting: Internal Medicine

## 2013-06-08 ENCOUNTER — Emergency Department (HOSPITAL_COMMUNITY)
Admission: EM | Admit: 2013-06-08 | Discharge: 2013-06-08 | Disposition: A | Discharge status: Home / Self Care | Payer: Medicare Other | Attending: Emergency Medicine | Admitting: Emergency Medicine

## 2013-06-08 ENCOUNTER — Encounter (HOSPITAL_COMMUNITY): Payer: Self-pay | Admitting: Emergency Medicine

## 2013-06-08 DIAGNOSIS — Y939 Activity, unspecified: Secondary | ICD-10-CM | POA: Insufficient documentation

## 2013-06-08 DIAGNOSIS — Z7901 Long term (current) use of anticoagulants: Secondary | ICD-10-CM | POA: Insufficient documentation

## 2013-06-08 DIAGNOSIS — E1149 Type 2 diabetes mellitus with other diabetic neurological complication: Secondary | ICD-10-CM | POA: Diagnosis not present

## 2013-06-08 DIAGNOSIS — Z79899 Other long term (current) drug therapy: Secondary | ICD-10-CM | POA: Diagnosis not present

## 2013-06-08 DIAGNOSIS — E039 Hypothyroidism, unspecified: Secondary | ICD-10-CM | POA: Diagnosis not present

## 2013-06-08 DIAGNOSIS — IMO0001 Reserved for inherently not codable concepts without codable children: Secondary | ICD-10-CM | POA: Insufficient documentation

## 2013-06-08 DIAGNOSIS — E669 Obesity, unspecified: Secondary | ICD-10-CM | POA: Insufficient documentation

## 2013-06-08 DIAGNOSIS — G40309 Generalized idiopathic epilepsy and epileptic syndromes, not intractable, without status epilepticus: Secondary | ICD-10-CM | POA: Diagnosis not present

## 2013-06-08 DIAGNOSIS — I4891 Unspecified atrial fibrillation: Secondary | ICD-10-CM | POA: Diagnosis not present

## 2013-06-08 DIAGNOSIS — Z8619 Personal history of other infectious and parasitic diseases: Secondary | ICD-10-CM | POA: Insufficient documentation

## 2013-06-08 DIAGNOSIS — F329 Major depressive disorder, single episode, unspecified: Secondary | ICD-10-CM | POA: Diagnosis not present

## 2013-06-08 DIAGNOSIS — Z91040 Latex allergy status: Secondary | ICD-10-CM | POA: Diagnosis not present

## 2013-06-08 DIAGNOSIS — I10 Essential (primary) hypertension: Secondary | ICD-10-CM | POA: Insufficient documentation

## 2013-06-08 DIAGNOSIS — W57XXXA Bitten or stung by nonvenomous insect and other nonvenomous arthropods, initial encounter: Secondary | ICD-10-CM | POA: Insufficient documentation

## 2013-06-08 DIAGNOSIS — K219 Gastro-esophageal reflux disease without esophagitis: Secondary | ICD-10-CM | POA: Insufficient documentation

## 2013-06-08 DIAGNOSIS — Z8673 Personal history of transient ischemic attack (TIA), and cerebral infarction without residual deficits: Secondary | ICD-10-CM | POA: Diagnosis not present

## 2013-06-08 DIAGNOSIS — Z880 Allergy status to penicillin: Secondary | ICD-10-CM | POA: Insufficient documentation

## 2013-06-08 DIAGNOSIS — F3289 Other specified depressive episodes: Secondary | ICD-10-CM | POA: Diagnosis not present

## 2013-06-08 DIAGNOSIS — Y929 Unspecified place or not applicable: Secondary | ICD-10-CM | POA: Insufficient documentation

## 2013-06-08 DIAGNOSIS — S30860A Insect bite (nonvenomous) of lower back and pelvis, initial encounter: Secondary | ICD-10-CM | POA: Diagnosis not present

## 2013-06-08 DIAGNOSIS — M1990 Unspecified osteoarthritis, unspecified site: Secondary | ICD-10-CM | POA: Insufficient documentation

## 2013-06-08 DIAGNOSIS — Z85828 Personal history of other malignant neoplasm of skin: Secondary | ICD-10-CM | POA: Insufficient documentation

## 2013-06-08 DIAGNOSIS — G909 Disorder of the autonomic nervous system, unspecified: Secondary | ICD-10-CM | POA: Diagnosis not present

## 2013-06-08 DIAGNOSIS — E785 Hyperlipidemia, unspecified: Secondary | ICD-10-CM | POA: Diagnosis not present

## 2013-06-08 DIAGNOSIS — Z792 Long term (current) use of antibiotics: Secondary | ICD-10-CM | POA: Insufficient documentation

## 2013-06-08 DIAGNOSIS — G4733 Obstructive sleep apnea (adult) (pediatric): Secondary | ICD-10-CM | POA: Diagnosis not present

## 2013-06-08 DIAGNOSIS — I509 Heart failure, unspecified: Secondary | ICD-10-CM | POA: Diagnosis not present

## 2013-06-08 DIAGNOSIS — E119 Type 2 diabetes mellitus without complications: Secondary | ICD-10-CM | POA: Insufficient documentation

## 2013-06-08 DIAGNOSIS — Z8739 Personal history of other diseases of the musculoskeletal system and connective tissue: Secondary | ICD-10-CM | POA: Diagnosis not present

## 2013-06-08 MED ORDER — DOXYCYCLINE HYCLATE 100 MG PO TABS
100.0000 mg | ORAL_TABLET | Freq: Once | ORAL | Status: AC
  Administered 2013-06-08: 100 mg via ORAL
  Filled 2013-06-08: qty 1

## 2013-06-08 MED ORDER — DOXYCYCLINE HYCLATE 100 MG PO TABS: 100.0000 mg | ORAL_TABLET | Freq: Two times a day (BID) | ORAL | Status: AC

## 2013-06-08 NOTE — ED Notes (Addendum)
Patient states she was bitten by a tick. Patient has small wound on middle of her upper back. Patient denies any new pain at this time. States she has chronic back pain.

## 2013-06-08 NOTE — Discharge Instructions (Signed)
As discussed, your evaluation today has been largely reassuring.  But, it is important that you monitor your condition carefully, and do not hesitate to return to the ED if you develop new, or concerning changes in your condition.  Your new antibiotic may change the effects of your Coumadin.  Please be sure to discuss this with her primary care physician, and monitor your INR carefully during the duration of your therapy.  Otherwise, please follow-up with your physician for appropriate ongoing care.

## 2013-06-08 NOTE — ED Provider Notes (Signed)
CSN: [id number]     Arrival date & time 06/08/13  0033 History   First MD Initiated Contact with Patient 06/08/13 0100     No chief complaint on file.    (Consider location/radiation/quality/duration/timing/severity/associated sxs/prior Treatment) HPI  Patient presents with concern of diffuse discomfort, new with the past 2 days, during which time she has had a new lesion in her upper back, which was found to be from a tic. Patient removed the tick today, and states that she was unaware of its presence until today. Until 2 days ago, subtly, the patient developed the aforementioned complaints without focal pain anywhere. She also denies current fever, chills, vomiting, diarrhea, confusion, disorientation. Patient does have multiple medical problems, including fibromyalgia.   Past Medical History  Diagnosis Date  . Depression   . Obesity   . Skin neoplasm   . Bunion   . Carotid stenosis   . Fibromyalgia   . Internal hemorrhoid   . GERD (gastroesophageal reflux disease)   . Chronic gastritis   . Transaminase or LDH elevation   . Mild cognitive impairment   . Hyperlipidemia   . Fatty liver   . Lumbar back pain   . Diabetic peripheral neuropathy   . Dysphagia     no documented strictures but responded positively to dilation in past.   . Hypertension   . Cholecystitis     s/p cholecystectomy  . Sarcoidosis   . Fibromyalgia   . CHF (congestive heart failure)   . Skin cancer 1990's    "front of my right leg"  . Atrial fibrillation   . OSA (obstructive sleep apnea) 2003    "can't sleep w/that darm machine"  . Exertional dyspnea   . Hypothyroidism   . Type II diabetes mellitus   . TIA (transient ischemic attack)     07/29/11 pt denies this history  . H/O hiatal hernia   . Epileptic seizure, tonic     .No meds since age of 47.  . Osteoarthritis   . Stroke     tia  . Hypothyroid   . Diverticulosis   . Digoxin toxicity     08/2011  . Bradycardia, drug induced     08/2011  (dig)  . Drug-induced hypotension     08/2011 (Dig)   Past Surgical History  Procedure Laterality Date  . Hiatal hernia repair  1990    "had to have scar tissue removed 6 months after repair"  . Esophageal dilation    . Cataract extraction w/phaco  10/27/2010    Procedure: CATARACT EXTRACTION PHACO AND INTRAOCULAR LENS PLACEMENT (IOC);  Surgeon: Williams Che;  Location: [facility];  Service: Ophthalmology;  Laterality: Left;  CDE- 1.78  . Cataract extraction w/phaco  07/13/2011    Procedure: CATARACT EXTRACTION PHACO AND INTRAOCULAR LENS PLACEMENT (IOC);  Surgeon: Williams Che, MD;  Location: [facility];  Service: Ophthalmology;  Laterality: Right;  CDE:  1.65  . Liposuction  1992  . Inverted nipples  1992  . Breast surgery  ~ 2010    excision milk duct; right breast  . Foot surgery  ~ 2011    ~straightened toe left foot & scraped bone below big toe"  . Foot surgery  ~ 2011    "scraped bone of big toe; shortened middle toe; right foot"  . Cholecystectomy  1980's  . Dilation and curettage of uterus  1970; 1976  . Abdominal hysterectomy  1980's    partial  . Tubal ligation  1976  .  Bilateral oophorectomy  1980's    "after partial hysterectomy"  . Skin cancer excision  1990's    "front side of right shin"  . Ventral hernia repair  07/29/2011    Procedure: LAPAROSCOPIC VENTRAL HERNIA;  Surgeon: Adin Hector, MD;  Location: [facility];  Service: General;  Laterality: N/A;  multiple incarcerated hernias with mesh  . Other surgical history      mutiple ab surgeries for abdominal hernia  . Other surgical history      multiple dilation of esophagus  . Other surgical history      right lens implant 07/2011, left lens implant 10/2010  . Other surgical history      right breast bx=benign   Family History  Problem Relation Age of Onset  . Crohn's disease Mother   . Colitis Mother     Crohns  . Anesthesia problems Mother   . Arthritis Mother     rheumatoid; maternal side of family also with  RA  . Cancer Father     skin  . Diabetes Father   . Heart disease Father   . Melanoma Father   . Coronary artery disease Father   . Diabetes Sister   . Depression Maternal Uncle   . Dementia Maternal Uncle   . Hypotension Neg Hx   . Malignant hyperthermia Neg Hx   . Pseudochol deficiency Neg Hx   . Colon cancer Paternal Uncle     ? if stomach or colon    History  Substance Use Topics  . Smoking status: Never Smoker   . Smokeless tobacco: Never Used  . Alcohol Use: No   OB History   Grav Para Term Preterm Abortions TAB SAB Ect Mult Living                 Review of Systems  Constitutional:       Per HPI, otherwise negative  HENT:       Per HPI, otherwise negative  Respiratory:       Per HPI, otherwise negative  Cardiovascular:       Per HPI, otherwise negative  Gastrointestinal: Negative for vomiting.  Endocrine:       Negative aside from HPI  Genitourinary:       Neg aside from HPI   Musculoskeletal:       Per HPI, otherwise negative  Skin: Negative.   Neurological: Negative for syncope.      Allergies  Gemfibrozil; Latex; Penicillins; Adhesive; Codeine; Ace inhibitors; and Digoxin and related  Home Medications   Current Outpatient Rx  Name  Route  Sig  Dispense  Refill  . acetaminophen (TYLENOL) 325 MG tablet   Oral   Take 2 tablets (650 mg total) by mouth every 4 (four) hours as needed.   50 tablet   0   . ALPRAZolam (XANAX) 0.5 MG tablet   Oral   Take 0.5 mg by mouth at bedtime as needed. For anxiety/sleep.         Marland Kitchen amitriptyline (ELAVIL) 100 MG tablet   Oral   Take 0.5 tablets (50 mg total) by mouth at bedtime.   45 tablet   4   . atenolol (TENORMIN) 25 MG tablet   Oral   Take 1.5 tablets (37.5 mg total) by mouth 2 (two) times daily.   135 tablet   4   . calcium carbonate (OS-CAL) 600 MG TABS   Oral   Take 600 mg by mouth daily.         Marland Kitchen  chlorpheniramine-HYDROcodone (TUSSIONEX) 10-8 MG/5ML LQCR   Oral   Take 5 mLs by mouth  every 12 (twelve) hours as needed for cough.   115 mL   0     Patient is on oral Vicodin ( hydrocodone- [facility]) PR ...   . cyclobenzaprine (FLEXERIL) 5 MG tablet   Oral   Take 1 tablet (5 mg total) by mouth 2 (two) times daily as needed. For fibromyalgia   60 tablet   1   . diltiazem (DILACOR XR) 180 MG 24 hr capsule   Oral   Take 1 capsule (180 mg total) by mouth daily.   90 capsule   4   . doxycycline (VIBRA-TABS) 100 MG tablet   Oral   Take 1 tablet (100 mg total) by mouth 2 (two) times daily.   28 tablet   0   . FLUoxetine (PROZAC) 20 MG tablet   Oral   Take 1 tablet (20 mg total) by mouth daily.   90 tablet   4   . furosemide (LASIX) 20 MG tablet   Oral   Take 1 tablet (20 mg total) by mouth daily.   90 tablet   4   . gabapentin (NEURONTIN) 100 MG capsule   Oral   Take 1 capsule (100 mg total) by mouth 3 (three) times daily.   270 capsule   4   . guaiFENesin-dextromethorphan (ROBITUSSIN DM) 100-10 MG/5ML syrup   Oral   Take 5 mLs by mouth every 4 (four) hours as needed for cough.   118 mL   0   . HYDROcodone-acetaminophen (NORCO/VICODIN) 5-325 MG per tablet   Oral   Take 1 tablet by mouth every 6 (six) hours as needed for moderate pain.   120 tablet   0   . ibuprofen (ADVIL,MOTRIN) 800 MG tablet   Oral   Take 800 mg by mouth every 8 (eight) hours as needed.         Marland Kitchen levothyroxine (SYNTHROID, LEVOTHROID) 75 MCG tablet   Oral   Take 1 tablet (75 mcg total) by mouth every morning.   90 tablet   4   . losartan (COZAAR) 25 MG tablet   Oral   Take 0.5 tablets (12.5 mg total) by mouth daily.   90 tablet   1   . metFORMIN (GLUCOPHAGE) 500 MG tablet   Oral   Take 1 tablet (500 mg total) by mouth daily with breakfast.   30 tablet   5   . methocarbamol (ROBAXIN) 500 MG tablet   Oral   Take 1 tablet (500 mg total) by mouth 2 (two) times daily.   20 tablet   0   . omeprazole (PRILOSEC) 20 MG capsule   Oral   Take 1 capsule (20 mg total) by  mouth daily.   90 capsule   4   . potassium chloride (K-DUR) 10 MEQ tablet   Oral   Take 1 tablet (10 mEq total) by mouth daily.   90 tablet   4   . pravastatin (PRAVACHOL) 40 MG tablet   Oral   Take 1 tablet (40 mg total) by mouth daily.   90 tablet   4     Please decrease current dose from 80 mg qhs to 40  ...   . warfarin (COUMADIN) 5 MG tablet   Oral   Take 0.5-1 tablets (2.5-5 mg total) by mouth daily. Take 5mg  on Sunday and Thursday. All other days take 2.5mg .   1 tablet  0     Denied.  Pt needs to get Rx refill from Cardiology ...    BP 136/88  Pulse 86  Temp(Src) 97.9 F (36.6 C) (Oral)  Resp 20  Ht 5\' 3"  (1.6 m)  Wt 198 lb (89.812 kg)  BMI 35.08 kg/m2  SpO2 99%  LMP 06/03/1978 Physical Exam  Nursing note and vitals reviewed. Constitutional: She is oriented to person, place, and time. She appears well-developed and well-nourished. No distress.  HENT:  Head: Normocephalic and atraumatic.  Eyes: Conjunctivae and EOM are normal.  Pulmonary/Chest: Effort normal. No stridor. No respiratory distress.  Abdominal: She exhibits no distension.  Musculoskeletal: She exhibits no edema.  Neurological: She is alert and oriented to person, place, and time. She displays no atrophy and no tremor. No cranial nerve deficit. She exhibits normal muscle tone. Gait normal.  Skin: Skin is warm and dry.     Psychiatric: She has a normal mood and affect.    ED Course  Procedures (including critical care time)  MDM   Final diagnoses:  Tick bite   Patient presents with concern of a tick bite.  Patient does have diffuse discomfort, but is hemodynamically stable, and has a history of multiple medical problems, including fibromyalgia.  With her new tick bite, she was started apparently on doxycycline therapy. Patient is on Coumadin, was advised of the necessity for frequent INR checks, and close monitoring with her physician.  Carmin Muskrat, MD 06/08/13 [phone]

## 2013-06-13 ENCOUNTER — Ambulatory Visit (INDEPENDENT_AMBULATORY_CARE_PROVIDER_SITE_OTHER): Payer: Medicare Other | Admitting: Internal Medicine

## 2013-06-13 ENCOUNTER — Encounter: Payer: Self-pay | Admitting: Internal Medicine

## 2013-06-13 VITALS — BP 136/78 | HR 72 | Temp 98.0°F | Ht 63.0 in | Wt 204.6 lb

## 2013-06-13 DIAGNOSIS — T148 Other injury of unspecified body region: Secondary | ICD-10-CM

## 2013-06-13 DIAGNOSIS — K089 Disorder of teeth and supporting structures, unspecified: Secondary | ICD-10-CM | POA: Diagnosis not present

## 2013-06-13 DIAGNOSIS — R109 Unspecified abdominal pain: Secondary | ICD-10-CM | POA: Diagnosis not present

## 2013-06-13 DIAGNOSIS — J069 Acute upper respiratory infection, unspecified: Secondary | ICD-10-CM | POA: Diagnosis not present

## 2013-06-13 DIAGNOSIS — M549 Dorsalgia, unspecified: Secondary | ICD-10-CM | POA: Diagnosis not present

## 2013-06-13 DIAGNOSIS — R42 Dizziness and giddiness: Secondary | ICD-10-CM | POA: Diagnosis not present

## 2013-06-13 DIAGNOSIS — G8929 Other chronic pain: Secondary | ICD-10-CM | POA: Diagnosis not present

## 2013-06-13 DIAGNOSIS — E119 Type 2 diabetes mellitus without complications: Secondary | ICD-10-CM

## 2013-06-13 DIAGNOSIS — I4891 Unspecified atrial fibrillation: Secondary | ICD-10-CM | POA: Diagnosis not present

## 2013-06-13 DIAGNOSIS — Z599 Problem related to housing and economic circumstances, unspecified: Secondary | ICD-10-CM

## 2013-06-13 DIAGNOSIS — W57XXXA Bitten or stung by nonvenomous insect and other nonvenomous arthropods, initial encounter: Secondary | ICD-10-CM | POA: Insufficient documentation

## 2013-06-13 DIAGNOSIS — Z598 Other problems related to housing and economic circumstances: Secondary | ICD-10-CM

## 2013-06-13 DIAGNOSIS — E785 Hyperlipidemia, unspecified: Secondary | ICD-10-CM | POA: Diagnosis not present

## 2013-06-13 DIAGNOSIS — K219 Gastro-esophageal reflux disease without esophagitis: Secondary | ICD-10-CM | POA: Diagnosis not present

## 2013-06-13 DIAGNOSIS — I10 Essential (primary) hypertension: Secondary | ICD-10-CM | POA: Diagnosis not present

## 2013-06-13 LAB — GLUCOSE, CAPILLARY: GLUCOSE-CAPILLARY: 293 mg/dL — AB (ref 70–99)

## 2013-06-13 LAB — POCT GLYCOSYLATED HEMOGLOBIN (HGB A1C): Hemoglobin A1C: 8.1

## 2013-06-13 MED ORDER — METFORMIN HCL 500 MG PO TABS: 500.0000 mg | ORAL_TABLET | Freq: Two times a day (BID) | ORAL | Status: DC

## 2013-06-13 MED ORDER — HYDROCODONE-ACETAMINOPHEN 5-325 MG PO TABS: 1.0000 | ORAL_TABLET | Freq: Four times a day (QID) | ORAL | Status: DC | PRN

## 2013-06-13 NOTE — Assessment & Plan Note (Signed)
Based on MRI (reviewed) see HPI Neurosurgery does not want to see the patient. She does have L4-L5 disc protrusion with right mild neural foraminal narrowing and feels like muscle spasm on exam  Reviewed other options with patient.  In the past we talked about long acting pain medication but money is a problem.  Today we disc'ed epidural injection for back pain to see if it helps.  If it does not help may send to pain clinic and further imaging upper (cervical) and thoracic back if needed in the future  Rec. Patient try Flexeril but avoid driving, continue Norco/Vicodin. Try to limit use of NSAIDS due to concominant use of Coumadin.  Set up for epidural injection 06/21/13 at 1:15 PM will clear with cardiology (Dr. Percival Spanish) as patient has to be off Coumadin for 4 days prior to procedure.  Sent Dr. Percival Spanish epic message

## 2013-06-13 NOTE — Assessment & Plan Note (Signed)
BP Readings from Last 3 Encounters:  06/13/13 136/78  06/08/13 136/88  05/18/13 128/77    Lab Results  Component Value Date   NA 140 01/12/2013   K 4.4 01/12/2013   CREATININE 0.82 01/12/2013    Assessment: Blood pressure control: controlled Progress toward BP goal:  at goal Comments: none  Plan: Medications:  continue current medications Educational resources provided: other (see comments) Self management tools provided: other (see comments) Other plans: f/u in 2 months

## 2013-06-13 NOTE — Patient Instructions (Addendum)
General Instructions: We are trying to get you an appt with radiology for epidural injection in your back. You must be off Coumadin for 4 days prior Please call Dr. Percival Spanish and let him know this as well.  Continue to take Flexeril, Norco/Vicodin If this does not work out we will send you to a pain clinic.   Will increase Metformin 500 mg bid    Treatment Goals:  Goals (1 Years of Data) as of 06/13/13         As of Today 06/08/13 05/18/13 05/07/13 05/07/13     Blood Pressure    . Blood Pressure < 140/90  138/61 136/88 128/77 113/70 125/70     Result Component    . HEMOGLOBIN A1C < 7.0  8.1          Progress Toward Treatment Goals:  Treatment Goal 06/13/2013  Hemoglobin A1C deteriorated  Blood pressure at goal  Prevent falls at goal    Self Care Goals & Plans:  Self Care Goal 06/13/2013  Manage my medications take my medicines as prescribed; bring my medications to every visit; refill my medications on time  Monitor my health keep track of my blood pressure  Eat healthy foods drink diet soda or water instead of juice or soda; eat more vegetables; eat foods that are low in salt; eat baked foods instead of fried foods; eat smaller portions; eat fruit for snacks and desserts  Be physically active find an activity I enjoy  Prevent falls -  Meeting treatment goals maintain the current self-care plan    Home Blood Glucose Monitoring 06/13/2013  Check my blood sugar no home glucose monitoring  When to check my blood sugar N/A     Care Management & Community Referrals:  Referral 06/13/2013  Referrals made for care management support none needed  Referrals made to community resources none     Back Pain, Adult Low back pain is very common. About 1 in 5 people have back pain.The cause of low back pain is rarely dangerous. The pain often gets better over time.About half of people with a sudden onset of back pain feel better in just 2 weeks. About 8 in 10 people feel better by 6 weeks.    CAUSES Some common causes of back pain include:  Strain of the muscles or ligaments supporting the spine.  Wear and tear (degeneration) of the spinal discs.  Arthritis.  Direct injury to the back. DIAGNOSIS Most of the time, the direct cause of low back pain is not known.However, back pain can be treated effectively even when the exact cause of the pain is unknown.Answering your caregiver's questions about your overall health and symptoms is one of the most accurate ways to make sure the cause of your pain is not dangerous. If your caregiver needs more information, he or she may order lab work or imaging tests (X-rays or MRIs).However, even if imaging tests show changes in your back, this usually does not require surgery. HOME CARE INSTRUCTIONS For many people, back pain returns.Since low back pain is rarely dangerous, it is often a condition that people can learn to [facility] their own.   Remain active. It is stressful on the back to sit or stand in one place. Do not sit, drive, or stand in one place for more than 30 minutes at a time. Take short walks on level surfaces as soon as pain allows.Try to increase the length of time you walk each day.  Do not stay in  bed.Resting more than 1 or 2 days can delay your recovery.  Do not avoid exercise or work.Your body is made to move.It is not dangerous to be active, even though your back may hurt.Your back will likely heal faster if you return to being active before your pain is gone.  Pay attention to your body when you bend and lift. Many people have less discomfortwhen lifting if they bend their knees, keep the load close to their bodies,and avoid twisting. Often, the most comfortable positions are those that put less stress on your recovering back.  Find a comfortable position to sleep. Use a firm mattress and lie on your side with your knees slightly bent. If you lie on your back, put a pillow under your knees.  Only take  over-the-counter or prescription medicines as directed by your caregiver. Over-the-counter medicines to reduce pain and inflammation are often the most helpful.Your caregiver may prescribe muscle relaxant drugs.These medicines help dull your pain so you can more quickly return to your normal activities and healthy exercise.  Put ice on the injured area.  Put ice in a plastic bag.  Place a towel between your skin and the bag.  Leave the ice on for 15-20 minutes, 03-04 times a day for the first 2 to 3 days. After that, ice and heat may be alternated to reduce pain and spasms.  Ask your caregiver about trying back exercises and gentle massage. This may be of some benefit.  Avoid feeling anxious or stressed.Stress increases muscle tension and can worsen back pain.It is important to recognize when you are anxious or stressed and learn ways to manage it.Exercise is a great option. SEEK MEDICAL CARE IF:  You have pain that is not relieved with rest or medicine.  You have pain that does not improve in 1 week.  You have new symptoms.  You are generally not feeling well. SEEK IMMEDIATE MEDICAL CARE IF:   You have pain that radiates from your back into your legs.  You develop new bowel or bladder control problems.  You have unusual weakness or numbness in your arms or legs.  You develop nausea or vomiting.  You develop abdominal pain.  You feel faint. Document Released: 02/23/2005 Document Revised: 08/25/2011 Document Reviewed: 07/14/2010 [facility] Patient Information 2014 [place], [place].   Fall Prevention and Home Safety Falls cause injuries and can affect all age groups. It is possible to prevent falls.  HOW TO PREVENT FALLS  Wear shoes with rubber soles that do not have an opening for your toes.  Keep the inside and outside of your house well lit.  Use night lights throughout your home.  Remove clutter from floors.  Clean up floor spills.  Remove throw rugs or fasten  them to the floor with carpet tape.  Do not place electrical cords across pathways.  Put grab bars by your tub, shower, and toilet. Do not use towel bars as grab bars.  Put handrails on both sides of the stairway. Fix loose handrails.  Do not climb on stools or stepladders, if possible.  Do not wax your floors.  Repair uneven or unsafe sidewalks, walkways, or stairs.  Keep items you use a lot within reach.  Be aware of pets.  Keep emergency numbers next to the telephone.  Put smoke detectors in your home and near bedrooms. Ask your doctor what other things you can do to prevent falls. Document Released: 12/20/2008 Document Revised: 08/25/2011 Document Reviewed: 05/26/2011 [facility] Patient Information 2014 [place], [place].  Hypertension As your heart beats, it forces blood through your arteries. This force is your blood pressure. If the pressure is too high, it is called hypertension (HTN) or high blood pressure. HTN is dangerous because you may have it and not know it. High blood pressure may mean that your heart has to work harder to pump blood. Your arteries may be narrow or stiff. The extra work puts you at risk for heart disease, stroke, and other problems.  Blood pressure consists of two numbers, a higher number over a lower, 110/72, for example. It is stated as "110 over 72." The ideal is below 120 for the top number (systolic) and under 80 for the bottom (diastolic). Write down your blood pressure today. You should pay close attention to your blood pressure if you have certain conditions such as:  Heart failure.  Prior heart attack.  Diabetes  Chronic kidney disease.  Prior stroke.  Multiple risk factors for heart disease. To see if you have HTN, your blood pressure should be measured while you are seated with your arm held at the level of the heart. It should be measured at least twice. A one-time elevated blood pressure reading (especially in the Emergency  Department) does not mean that you need treatment. There may be conditions in which the blood pressure is different between your right and left arms. It is important to see your caregiver soon for a recheck. Most people have essential hypertension which means that there is not a specific cause. This type of high blood pressure may be lowered by changing lifestyle factors such as:  Stress.  Smoking.  Lack of exercise.  Excessive weight.  Drug/tobacco/alcohol use.  Eating less salt. Most people do not have symptoms from high blood pressure until it has caused damage to the body. Effective treatment can often prevent, delay or reduce that damage. TREATMENT  When a cause has been identified, treatment for high blood pressure is directed at the cause. There are a large number of medications to treat HTN. These fall into several categories, and your caregiver will help you select the medicines that are best for you. Medications may have side effects. You should review side effects with your caregiver. If your blood pressure stays high after you have made lifestyle changes or started on medicines,   Your medication(s) may need to be changed.  Other problems may need to be addressed.  Be certain you understand your prescriptions, and know how and when to take your medicine.  Be sure to follow up with your caregiver within the time frame advised (usually within two weeks) to have your blood pressure rechecked and to review your medications.  If you are taking more than one medicine to lower your blood pressure, make sure you know how and at what times they should be taken. Taking two medicines at the same time can result in blood pressure that is too low. SEEK IMMEDIATE MEDICAL CARE IF:  You develop a severe headache, blurred or changing vision, or confusion.  You have unusual weakness or numbness, or a faint feeling.  You have severe chest or abdominal pain, vomiting, or breathing  problems. MAKE SURE YOU:   Understand these instructions.  Will watch your condition.  Will get help right away if you are not doing well or get worse. Document Released: 02/23/2005 Document Revised: 05/18/2011 Document Reviewed: 10/14/2007 [facility] Patient Information 2014 [place].  Type 2 Diabetes Mellitus, Adult Type 2 diabetes mellitus is a long-term (chronic) disease.  In type 2 diabetes:  The pancreas does not make enough of a hormone called insulin.  The cells in the body do not respond as well to the insulin that is made.  Both of the above can happen. Normally, insulin moves sugars from food into tissue cells. This gives you energy. If you have type 2 diabetes, sugars cannot be moved into tissue cells. This causes high blood sugar (hyperglycemia).  HOME CARE  Have your hemoglobin A1c level checked twice a year. The level shows if your diabetes is under control or out of control.  Perform daily blood sugar testing as told by your doctor.  Check your ketone levels by testing your pee (urine) when you are sick and as told.  Take your diabetes or insulin medicine as told by your doctor.  Never run out of insulin.  Adjust how much insulin you give yourself based on how many carbs (carbohydrates) you eat. Carbs are in many foods, such as fruits, vegetables, whole grains, and dairy products.  Have a healthy snack between every healthy meal. Have 3 meals and 3 snacks a day.  Lose weight if you are overweight.  Carry a medical alert card or wear your medical alert jewelry.  Carry a 15 gram carb snack with you at all times. Examples include:  Glucose pills, 3 or 4.  Glucose gel, 15 gram tube.  Raisins, 2 tablespoons (24 grams).  Jelly beans, 6.  Animal crackers, 8.  Sugar pop, 4 ounces (120 milliliters).  Gummy treats, 9.  Notice low blood sugar (hypoglycemia) symptoms, such as:  Shaking (tremors).  Decreased ability to think  clearly.  Sweating.  Increased heart rate.  Headache.  Dry mouth.  Hunger.  Crabbiness (irritability).  Being worried or tense (anxiety).  Restless sleep.  A change in speech or coordination.  Confusion.  Treat low blood sugar right away. If you are alert and can swallow, follow the 15:15 rule:  Take 15 20 grams of a rapid-acting glucose or carb. This includes glucose gel, glucose pills, or 4 ounces (120 milliliters) of fruit juice, regular pop, or low-fat milk.  Check your blood sugar level after taking the glucose.  Take 15 20 grams of more glucose if the repeat blood sugar level is still 70 mg/dL (milligrams/deciliter) or below.  Eat a meal or snack within 1 hour of the blood sugar levels going back to normal.  Notice early symptoms of high blood sugar, such as:  Being really thirsty or drinking a lot (polydipsia).  Peeing (urinating) a lot (polyuria).  Do at least 150 minutes of physical activity a week or as told.  Split the 150 minutes of activity up during the week. Do not do 150 minutes of activity in one day.  Perform exercises, such as weight lifting, at least 2 times a week or as told.  Adjust your insulin or food intake as needed if you start a new exercise or sport.  Follow your sick day plan when you are not able to eat or drink as usual.  Avoid tobacco use.  Women who are not pregnant should drink no more than 1 drink a day. Men should drink no more than 2 drinks a day.  Only drink alcohol with food.  Ask your doctor if alcohol is safe for you.  Tell your doctor if you drink alcohol several times during the week.  See your doctor regularly.  Schedule an eye exam soon after you are diagnosed with diabetes. Schedule exams once every  year.  Check your skin and feet every day. Check for cuts, bruises, redness, nail problems, bleeding, blisters, or sores. A doctor should do a foot exam once a year.  Brush your teeth and gums twice a day. Floss  once a day. Visit your dentist regularly.  Share your diabetes plan with your workplace or school.  Stay up-to-date with shots that fight against diseases (immunizations).  Learn how to manage stress.  Get diabetes education and support as needed.  Ask your doctor for special help if:  You need help to maintain or improve how you to do things on your own.  You need help to maintain or improve the quality of your life.  You have foot or hand problems.  You have trouble cleaning yourself, dressing, eating, or doing physical activity. GET HELP RIGHT AWAY IF:  You have trouble breathing.  You have moderate to large ketone levels.  You are unable to eat food or drink fluids for more than 6 hours.  You feel sick to your stomach (nauseous) or throw up (vomit) for more than 6 hours.  Your blood sugar level is over 240 mg/dL.  There is a change in mental status.  You get another serious illness.  You have watery poop (diarrhea) for more than 6 hours.  You have been sick or have had a fever for 2 or more days and are not getting better.  You have pain when you are physically active. MAKE SURE YOU:  Understand these instructions.  Will watch your condition.  Will get help right away if you are not doing well or get worse. Document Released: 12/03/2007 Document Revised: 12/14/2012 Document Reviewed: 06/24/2012 [facility] Patient Information 2014 [place], [place].

## 2013-06-13 NOTE — Assessment & Plan Note (Signed)
Lab Results  Component Value Date   HGBA1C 8.1 06/13/2013   HGBA1C 6.6 04/03/2013   HGBA1C 6.1 01/12/2013     Assessment: Diabetes control: fair control Progress toward A1C goal:  deteriorated Comments: patient diet [place]  Plan: Medications:  continue current medicationsMetformin 500 mg daily will increase to bid  Home glucose monitoring: Frequency: no home glucose monitoring Timing: N/A Instruction/counseling given: reminded to get eye exam Educational resources provided: other (see comments) Self management tools provided: other (see comments) Other plans: f/u in 3 months

## 2013-06-13 NOTE — Assessment & Plan Note (Signed)
Encouraged pt to finish Doxycycline course

## 2013-06-13 NOTE — Assessment & Plan Note (Signed)
Checked orthostatics negative today Ensure adequate hydration RTC if reoccurs

## 2013-06-13 NOTE — Assessment & Plan Note (Signed)
Referred to social work to see if can help with medications

## 2013-06-13 NOTE — Progress Notes (Signed)
Subjective:    Patient ID: [name], female    DOB: Oct 20, 1950, 63 y.o.   MRN: [id number]  HPI Comments: 63 y.o female PMH depression, obesity, skin neoplasm (right leg), carotid stenosis, Fibromyalgia, Internal hemorrhoid, GERD (gastroesophageal reflux disease), Chronic gastritis, Transaminase or LDH elevation, Hyperlipidemia (LDL 58 01/2013), Fatty liver, Chronic pain (back pain), chronic h/a, chronic abdominal pain, diverticulosis (follow with Dr. Sharlett Iles GI), Diabetic peripheral neuropathy, Dysphagia with h/o strictures and dilation, Hypertension (controlled BP 138/61), Cholecystitis s/p cholecystectomy, Sarcoidosis, CHF (congestive heart failure), Atrial fibrillation on Coumadin (INR 2.5 on 05/19/13 followed by cardiology Dr. Percival Spanish), OSA (obstructive sleep apnea) w/o cpap, Exertional dyspnea, Hypothyroidism, Type II diabetes mellitus (HA1C 6.6 03/2013 now 8.1 with cbg today of 293), H/O hiatal hernia, h/o ventral hernia with complex repair, epileptic seizure (as a child), Osteoarthritis, diverticulosis, digoxin drug-induced hypotension and bradycardia, borderline osteopenia 2010.  1) Chronic back pain daily.  Placed previous referral to Dr. Carloyn Manner in [place] called his office today and based on MRI she does not need to be seen there.  Lumbar MRI reviewed today with the patient.  Repeat MRI with T12-L3 negative; L3-L4 mild facet arthrosis w/o stenosis; L4-L5 disc protrusion and moderate facet hypertrophy which can result in mild right neural foraminal narrowing w/o spinal stenosis; L5-S1 with mild facet arthrosis w/o stenosis.  She tried Ibuprofen 800 mg tid prn w/o relief in addition to Norco/Vicodin q6 prn.  She has Flexeril and Robaxin as well.  She has not tried the Robaxin.  She is also trying Ibuprofen 800 mg bid prn.  Back pain is 10/10 in the upper back and lower back.  Sometimes the right lower back pain shoots up to her right shoulder and she cant bear it.  Pain medications  currently not helping and she wants to review other options.  She states her back is hurting 90-95% of the time. Back pain is not relieved by standing. She tries a pillow behind her back to see if that will help.  She denies lower extremity numbness or tingling or perineal sensation changes.  She wants the back pain better b/c she visits her daughter in [place] in early May 2015    2) DM history-cbg today 293 and HA1C 8.1%. She was taking Metformin 500 mg daily. Diet was [place] due to money issues.  She has been eating bread with mayo or canned peaches.    3) tick bite-seen recently in the ED for a tick bite in upper mid back.  Area was more itchy and now it has calmed down.  She is still taking Doxycycline.    4) She reports lightheadedness x 1 week.  Orthostatics checked today and negative lying 131/83 (HR 104), sitting 131/83 (HR 98), standing 136/78 (HR72). Orthostatics negative today  5) She denies falls since the last a few months ago.    SH: she is unable to afford a lot of her medications when they run out soon and she will not be able to until next month.       Review of Systems  Musculoskeletal: Positive for back pain.  Neurological: Positive for light-headedness.       Lightheaded x 1 week       Objective:   Physical Exam  Nursing note and vitals reviewed. Constitutional: She is oriented to person, place, and time. Vital signs are normal. She appears well-developed and well-nourished. She is cooperative. No distress.  HENT:  Head: Normocephalic and atraumatic.  Mouth/Throat:  Oropharynx is clear and moist and mucous membranes are normal. Abnormal dentition. No oropharyngeal exudate.  Eyes: Conjunctivae are normal. Pupils are equal, round, and reactive to light. Right eye exhibits no discharge. Left eye exhibits no discharge. No scleral icterus.  Neck: Normal range of motion.  Cardiovascular: Normal rate, S1 normal, S2 normal and normal heart sounds.  An irregular rhythm present.  No  murmur heard. No lower ext edema  History AF  Pulmonary/Chest: Effort normal and breath sounds normal. No respiratory distress. She has no wheezes.  Musculoskeletal:       Back:  Pt able to bend and touch hands just below knees, On lateral rotation produces pain to right lower lateral back   Neurological: She is alert and oriented to person, place, and time. Gait normal.  Skin: Skin is warm and dry. No rash noted. She is not diaphoretic.     Psychiatric: She has a normal mood and affect. Her speech is normal and behavior is normal. Judgment and thought content normal. Cognition and memory are normal.          Assessment & Plan:  F/u in 07/2013

## 2013-06-14 ENCOUNTER — Encounter: Payer: Self-pay | Admitting: Internal Medicine

## 2013-06-14 NOTE — Progress Notes (Signed)
INTERNAL MEDICINE TEACHING ATTENDING ADDENDUM - Nischal Narendra, MD: I reviewed and discussed at the time of visit with the resident Dr. Mclean, the patient’s medical history, physical examination, diagnosis and results of tests and treatment and I agree with the patient's care as documented. °

## 2013-06-15 ENCOUNTER — Telehealth: Payer: Self-pay | Admitting: Licensed Clinical Social Worker

## 2013-06-15 ENCOUNTER — Telehealth: Payer: Self-pay | Admitting: Cardiology

## 2013-06-15 NOTE — Telephone Encounter (Signed)
New message    Patient having  upcoming procedure steroid injection in back . Need to come  Off coumadin for 4 day prior please advise.

## 2013-06-15 NOTE — Telephone Encounter (Signed)
[name] was referred to CSW as patient states she is unable to afford medications.  CSW placed call to [name], provided information on [employer].  Requested pt to apply.  If pt is not eligible for [employer], pt encouraged to contact CSW back to explore which medication manufacturers may have a program available.  Pt aware and will contact CSW.  CSW will sign off for now and await for determination from [employer].

## 2013-06-15 NOTE — Telephone Encounter (Signed)
Left message at [facility] for Andee Poles to call back tomorrow.

## 2013-06-19 NOTE — Telephone Encounter (Signed)
Will fax as requested - taken to MR.

## 2013-06-19 NOTE — Telephone Encounter (Signed)
Will forward to Dr Percival Spanish to review

## 2013-06-19 NOTE — Telephone Encounter (Signed)
OK to come off of warfarin as needed for the procedure.  No bridging needed.

## 2013-06-20 ENCOUNTER — Ambulatory Visit (INDEPENDENT_AMBULATORY_CARE_PROVIDER_SITE_OTHER): Payer: Medicare Other | Admitting: *Deleted

## 2013-06-20 ENCOUNTER — Other Ambulatory Visit: Payer: Self-pay | Admitting: Internal Medicine

## 2013-06-20 DIAGNOSIS — G459 Transient cerebral ischemic attack, unspecified: Secondary | ICD-10-CM

## 2013-06-20 DIAGNOSIS — Z5181 Encounter for therapeutic drug level monitoring: Secondary | ICD-10-CM | POA: Diagnosis not present

## 2013-06-20 DIAGNOSIS — M545 Low back pain, unspecified: Secondary | ICD-10-CM

## 2013-06-20 DIAGNOSIS — Z7901 Long term (current) use of anticoagulants: Secondary | ICD-10-CM

## 2013-06-20 DIAGNOSIS — I4891 Unspecified atrial fibrillation: Secondary | ICD-10-CM

## 2013-06-20 LAB — POCT INR: INR: 1.3

## 2013-06-21 ENCOUNTER — Ambulatory Visit
Admission: RE | Admit: 2013-06-21 | Discharge: 2013-06-21 | Disposition: A | Discharge status: Home / Self Care | Payer: Medicare Other | Source: Ambulatory Visit | Attending: Internal Medicine | Admitting: Internal Medicine

## 2013-06-21 DIAGNOSIS — M545 Low back pain, unspecified: Secondary | ICD-10-CM | POA: Diagnosis not present

## 2013-06-21 MED ORDER — METHYLPREDNISOLONE ACETATE 40 MG/ML INJ SUSP (RADIOLOG
120.0000 mg | Freq: Once | INTRAMUSCULAR | Status: AC
  Administered 2013-06-21: 120 mg via EPIDURAL

## 2013-06-21 MED ORDER — IOHEXOL 180 MG/ML  SOLN
1.0000 mL | Freq: Once | INTRAMUSCULAR | Status: AC | PRN
  Administered 2013-06-21: 1 mL via EPIDURAL

## 2013-06-21 NOTE — Discharge Instructions (Addendum)
Post Procedure Spinal Discharge Instruction Sheet  1. You may resume a regular diet and any medications that you routinely take (including pain medications).  2. No driving day of procedure.  3. Light activity throughout the rest of the day.  Do not do any strenuous work, exercise, bending or lifting.  The day following the procedure, you can resume normal physical activity but you should refrain from exercising or physical therapy for at least three days thereafter.   Common Side Effects:   Headaches- take your usual medications as directed by your physician.  Increase your fluid intake.  Caffeinated beverages may be helpful.  Lie flat in bed until your headache resolves.   Restlessness or inability to sleep- you may have trouble sleeping for the next few days.  Ask your referring physician if you need any medication for sleep.   Facial flushing or redness- should subside within a few days.   Increased pain- a temporary increase in pain a day or two following your procedure is not unusual.  Take your pain medication as prescribed by your referring physician.   Leg cramps  Please contact our office at [phone] for the following symptoms:  Fever greater than 100 degrees.  Headaches unresolved with medication after 2-3 days.  Increased swelling, pain, or redness at injection site.  Thank you for visiting our office.   May resume coumadin today. 

## 2013-06-22 ENCOUNTER — Encounter: Payer: Medicare Other | Admitting: Internal Medicine

## 2013-06-23 ENCOUNTER — Other Ambulatory Visit: Payer: Self-pay | Admitting: Internal Medicine

## 2013-06-26 ENCOUNTER — Emergency Department (HOSPITAL_COMMUNITY)
Admission: EM | Admit: 2013-06-26 | Discharge: 2013-06-26 | Disposition: A | Discharge status: Home / Self Care | Payer: Medicare Other | Attending: Emergency Medicine | Admitting: Emergency Medicine

## 2013-06-26 ENCOUNTER — Emergency Department (HOSPITAL_COMMUNITY): Payer: Medicare Other

## 2013-06-26 ENCOUNTER — Encounter (HOSPITAL_COMMUNITY): Payer: Self-pay | Admitting: Emergency Medicine

## 2013-06-26 ENCOUNTER — Other Ambulatory Visit: Payer: Self-pay

## 2013-06-26 DIAGNOSIS — Z7901 Long term (current) use of anticoagulants: Secondary | ICD-10-CM | POA: Diagnosis not present

## 2013-06-26 DIAGNOSIS — A499 Bacterial infection, unspecified: Secondary | ICD-10-CM | POA: Diagnosis not present

## 2013-06-26 DIAGNOSIS — E1149 Type 2 diabetes mellitus with other diabetic neurological complication: Secondary | ICD-10-CM | POA: Insufficient documentation

## 2013-06-26 DIAGNOSIS — I10 Essential (primary) hypertension: Secondary | ICD-10-CM | POA: Diagnosis not present

## 2013-06-26 DIAGNOSIS — B9689 Other specified bacterial agents as the cause of diseases classified elsewhere: Secondary | ICD-10-CM | POA: Insufficient documentation

## 2013-06-26 DIAGNOSIS — E1142 Type 2 diabetes mellitus with diabetic polyneuropathy: Secondary | ICD-10-CM | POA: Diagnosis not present

## 2013-06-26 DIAGNOSIS — F329 Major depressive disorder, single episode, unspecified: Secondary | ICD-10-CM | POA: Insufficient documentation

## 2013-06-26 DIAGNOSIS — Z8619 Personal history of other infectious and parasitic diseases: Secondary | ICD-10-CM | POA: Diagnosis not present

## 2013-06-26 DIAGNOSIS — Z9889 Other specified postprocedural states: Secondary | ICD-10-CM | POA: Diagnosis not present

## 2013-06-26 DIAGNOSIS — R110 Nausea: Secondary | ICD-10-CM | POA: Insufficient documentation

## 2013-06-26 DIAGNOSIS — Z9089 Acquired absence of other organs: Secondary | ICD-10-CM | POA: Diagnosis not present

## 2013-06-26 DIAGNOSIS — F3289 Other specified depressive episodes: Secondary | ICD-10-CM | POA: Insufficient documentation

## 2013-06-26 DIAGNOSIS — E669 Obesity, unspecified: Secondary | ICD-10-CM | POA: Diagnosis not present

## 2013-06-26 DIAGNOSIS — Z79899 Other long term (current) drug therapy: Secondary | ICD-10-CM | POA: Insufficient documentation

## 2013-06-26 DIAGNOSIS — I4891 Unspecified atrial fibrillation: Secondary | ICD-10-CM | POA: Insufficient documentation

## 2013-06-26 DIAGNOSIS — G40309 Generalized idiopathic epilepsy and epileptic syndromes, not intractable, without status epilepticus: Secondary | ICD-10-CM | POA: Insufficient documentation

## 2013-06-26 DIAGNOSIS — K219 Gastro-esophageal reflux disease without esophagitis: Secondary | ICD-10-CM | POA: Diagnosis not present

## 2013-06-26 DIAGNOSIS — N760 Acute vaginitis: Secondary | ICD-10-CM | POA: Diagnosis not present

## 2013-06-26 DIAGNOSIS — Z8673 Personal history of transient ischemic attack (TIA), and cerebral infarction without residual deficits: Secondary | ICD-10-CM | POA: Insufficient documentation

## 2013-06-26 DIAGNOSIS — E119 Type 2 diabetes mellitus without complications: Secondary | ICD-10-CM | POA: Insufficient documentation

## 2013-06-26 DIAGNOSIS — R141 Gas pain: Secondary | ICD-10-CM | POA: Diagnosis not present

## 2013-06-26 DIAGNOSIS — M1990 Unspecified osteoarthritis, unspecified site: Secondary | ICD-10-CM | POA: Diagnosis not present

## 2013-06-26 DIAGNOSIS — Z85828 Personal history of other malignant neoplasm of skin: Secondary | ICD-10-CM | POA: Diagnosis not present

## 2013-06-26 DIAGNOSIS — E039 Hypothyroidism, unspecified: Secondary | ICD-10-CM | POA: Insufficient documentation

## 2013-06-26 DIAGNOSIS — R142 Eructation: Secondary | ICD-10-CM | POA: Diagnosis not present

## 2013-06-26 DIAGNOSIS — Z90710 Acquired absence of both cervix and uterus: Secondary | ICD-10-CM | POA: Insufficient documentation

## 2013-06-26 DIAGNOSIS — Z91040 Latex allergy status: Secondary | ICD-10-CM | POA: Diagnosis not present

## 2013-06-26 DIAGNOSIS — Z880 Allergy status to penicillin: Secondary | ICD-10-CM | POA: Insufficient documentation

## 2013-06-26 DIAGNOSIS — E785 Hyperlipidemia, unspecified: Secondary | ICD-10-CM | POA: Insufficient documentation

## 2013-06-26 DIAGNOSIS — R079 Chest pain, unspecified: Secondary | ICD-10-CM | POA: Insufficient documentation

## 2013-06-26 DIAGNOSIS — I509 Heart failure, unspecified: Secondary | ICD-10-CM | POA: Diagnosis not present

## 2013-06-26 LAB — CBC
HCT: 39.4 % (ref 36.0–46.0)
Hemoglobin: 13.6 g/dL (ref 12.0–15.0)
MCH: 31.1 pg (ref 26.0–34.0)
MCHC: 34.5 g/dL (ref 30.0–36.0)
MCV: 90 fL (ref 78.0–100.0)
Platelets: 273 10*3/uL (ref 150–400)
RBC: 4.38 MIL/uL (ref 3.87–5.11)
RDW: 12.9 % (ref 11.5–15.5)
WBC: 10 10*3/uL (ref 4.0–10.5)

## 2013-06-26 LAB — BASIC METABOLIC PANEL
BUN: 19 mg/dL (ref 6–23)
CHLORIDE: 99 meq/L (ref 96–112)
CO2: 21 meq/L (ref 19–32)
Calcium: 9.2 mg/dL (ref 8.4–10.5)
Creatinine, Ser: 0.82 mg/dL (ref 0.50–1.10)
GFR calc Af Amer: 86 mL/min — ABNORMAL LOW (ref >90)
GFR calc non Af Amer: 74 mL/min — ABNORMAL LOW (ref >90)
GLUCOSE: 285 mg/dL — AB (ref 70–99)
Potassium: 4.4 mEq/L (ref 3.7–5.3)
Sodium: 139 mEq/L (ref 137–147)

## 2013-06-26 LAB — WET PREP, GENITAL
Trich, Wet Prep: NONE SEEN
WBC, Wet Prep HPF POC: NONE SEEN
Yeast Wet Prep HPF POC: NONE SEEN

## 2013-06-26 LAB — PROTIME-INR
INR: 1.57 — ABNORMAL HIGH (ref 0.00–1.49)
Prothrombin Time: 18.3 seconds — ABNORMAL HIGH (ref 11.6–15.2)

## 2013-06-26 LAB — TROPONIN I: Troponin I: <0.3 ng/mL (ref <0.30)

## 2013-06-26 LAB — PRO B NATRIURETIC PEPTIDE: Pro B Natriuretic peptide (BNP): 615.4 pg/mL — ABNORMAL HIGH (ref 0–125)

## 2013-06-26 MED ORDER — METRONIDAZOLE 500 MG PO TABS: 500.0000 mg | ORAL_TABLET | Freq: Two times a day (BID) | ORAL | Status: DC

## 2013-06-26 NOTE — ED Provider Notes (Signed)
CSN: [id number]     Arrival date & time 06/26/13  1409 History   First MD Initiated Contact with Patient 06/26/13 1702     Chief Complaint  Patient presents with  . Chest Pain     (Consider location/radiation/quality/duration/timing/severity/associated sxs/prior Treatment) HPI  This is a 63 y.o. female with PMH of fibromyalgia, depression, obesity, multiple abdominal hernias status post repair, hyperlipidemia, cholecystitis status post collate cystectomy, atrial fib on Coumadin,  presenting with abdominal pain. Onset yesterday evening. Located epigastric. Resolved yesterday evening, but returned this morning upon awakening. Sharp, mild. Radiates to back. Negative for weakness, numbness, tingling. She states that she sweated last night. Positive for chronic nausea, without vomiting.  Past Medical History  Diagnosis Date  . Depression   . Obesity   . Skin neoplasm   . Bunion   . Carotid stenosis   . Fibromyalgia   . Internal hemorrhoid   . GERD (gastroesophageal reflux disease)   . Chronic gastritis   . Transaminase or LDH elevation   . Mild cognitive impairment   . Hyperlipidemia   . Fatty liver   . Lumbar back pain   . Diabetic peripheral neuropathy   . Dysphagia     no documented strictures but responded positively to dilation in past.   . Hypertension   . Cholecystitis     s/p cholecystectomy  . Sarcoidosis   . Fibromyalgia   . CHF (congestive heart failure)   . Skin cancer 1990's    "front of my right leg"  . Atrial fibrillation   . OSA (obstructive sleep apnea) 2003    "can't sleep w/that darm machine"  . Exertional dyspnea   . Hypothyroidism   . Type II diabetes mellitus   . TIA (transient ischemic attack)     07/29/11 pt denies this history  . H/O hiatal hernia   . Epileptic seizure, tonic     .No meds since age of 43.  . Osteoarthritis   . Stroke     tia  . Hypothyroid   . Diverticulosis   . Digoxin toxicity     08/2011  . Bradycardia, drug induced      08/2011 (dig)  . Drug-induced hypotension     08/2011 (Dig)   Past Surgical History  Procedure Laterality Date  . Hiatal hernia repair  1990    "had to have scar tissue removed 6 months after repair"  . Esophageal dilation    . Cataract extraction w/phaco  10/27/2010    Procedure: CATARACT EXTRACTION PHACO AND INTRAOCULAR LENS PLACEMENT (IOC);  Surgeon: Williams Che;  Location: [facility];  Service: Ophthalmology;  Laterality: Left;  CDE- 1.78  . Cataract extraction w/phaco  07/13/2011    Procedure: CATARACT EXTRACTION PHACO AND INTRAOCULAR LENS PLACEMENT (IOC);  Surgeon: Williams Che, MD;  Location: [facility];  Service: Ophthalmology;  Laterality: Right;  CDE:  1.65  . Liposuction  1992  . Inverted nipples  1992  . Breast surgery  ~ 2010    excision milk duct; right breast  . Foot surgery  ~ 2011    ~straightened toe left foot & scraped bone below big toe"  . Foot surgery  ~ 2011    "scraped bone of big toe; shortened middle toe; right foot"  . Cholecystectomy  1980's  . Dilation and curettage of uterus  1970; 1976  . Abdominal hysterectomy  1980's    partial  . Tubal ligation  1976  . Bilateral oophorectomy  1980's    "  after partial hysterectomy"  . Skin cancer excision  1990's    "front side of right shin"  . Ventral hernia repair  07/29/2011    Procedure: LAPAROSCOPIC VENTRAL HERNIA;  Surgeon: Adin Hector, MD;  Location: [facility];  Service: General;  Laterality: N/A;  multiple incarcerated hernias with mesh  . Other surgical history      mutiple ab surgeries for abdominal hernia  . Other surgical history      multiple dilation of esophagus  . Other surgical history      right lens implant 07/2011, left lens implant 10/2010  . Other surgical history      right breast bx=benign   Family History  Problem Relation Age of Onset  . Crohn's disease Mother   . Colitis Mother     Crohns  . Anesthesia problems Mother   . Arthritis Mother     rheumatoid; maternal side of family  also with RA  . Cancer Father     skin  . Diabetes Father   . Heart disease Father   . Melanoma Father   . Coronary artery disease Father   . Diabetes Sister   . Depression Maternal Uncle   . Dementia Maternal Uncle   . Hypotension Neg Hx   . Malignant hyperthermia Neg Hx   . Pseudochol deficiency Neg Hx   . Colon cancer Paternal Uncle     ? if stomach or colon    History  Substance Use Topics  . Smoking status: Never Smoker   . Smokeless tobacco: Never Used  . Alcohol Use: No   OB History   Grav Para Term Preterm Abortions TAB SAB Ect Mult Living                 Review of Systems  Constitutional: Negative for fever and chills.  HENT: Negative for facial swelling.   Eyes: Negative for photophobia and pain.  Respiratory: Negative for cough and shortness of breath.   Cardiovascular: Negative for chest pain and leg swelling.  Gastrointestinal: Positive for nausea and abdominal pain. Negative for vomiting.  Genitourinary: Negative for dysuria.  Musculoskeletal: Negative for arthralgias.  Skin: Negative for rash and wound.  Neurological: Negative for seizures.  Hematological: Negative for adenopathy.      Allergies  Gemfibrozil; Latex; Penicillins; Adhesive; Codeine; Ace inhibitors; and Digoxin and related  Home Medications   Prior to Admission medications   Medication Sig Start Date End Date Taking? Authorizing Provider  ALPRAZolam Duanne Moron) 0.5 MG tablet Take 0.5 mg by mouth at bedtime as needed. For anxiety/sleep.   Yes Historical Provider, MD  amitriptyline (ELAVIL) 100 MG tablet Take 0.5 tablets (50 mg total) by mouth at bedtime.   Yes Cresenciano Genre, MD  atenolol (TENORMIN) 25 MG tablet Take 1.5 tablets (37.5 mg total) by mouth 2 (two) times daily. 05/01/13  Yes Cresenciano Genre, MD  calcium carbonate (OS-CAL) 600 MG TABS Take 600 mg by mouth daily.   Yes Historical Provider, MD  cyclobenzaprine (FLEXERIL) 5 MG tablet Take 5 mg by mouth 2 (two) times daily as needed.  For fibromyalgia 04/20/13  Yes Cresenciano Genre, MD  diltiazem (DILACOR XR) 180 MG 24 hr capsule Take 1 capsule (180 mg total) by mouth daily. 07/05/12  Yes Cresenciano Genre, MD  FLUoxetine (PROZAC) 20 MG tablet Take 1 tablet (20 mg total) by mouth daily.   Yes Cresenciano Genre, MD  furosemide (LASIX) 20 MG tablet Take 1 tablet (20 mg total)  by mouth daily.   Yes Cresenciano Genre, MD  gabapentin (NEURONTIN) 100 MG capsule Take 100 mg by mouth 2 (two) times daily. 07/05/12  Yes Cresenciano Genre, MD  HYDROcodone-acetaminophen (NORCO/VICODIN) 5-325 MG per tablet Take 1 tablet by mouth every 6 (six) hours as needed for moderate pain. 06/13/13  Yes Cresenciano Genre, MD  ibuprofen (ADVIL,MOTRIN) 800 MG tablet Take 800 mg by mouth every 8 (eight) hours as needed for moderate pain.    Yes Historical Provider, MD  levothyroxine (SYNTHROID, LEVOTHROID) 75 MCG tablet Take 1 tablet (75 mcg total) by mouth every morning.   Yes Cresenciano Genre, MD  losartan (COZAAR) 25 MG tablet Take 0.5 tablets (12.5 mg total) by mouth daily. 05/01/13  Yes Cresenciano Genre, MD  metFORMIN (GLUCOPHAGE) 500 MG tablet Take 1 tablet (500 mg total) by mouth 2 (two) times daily with a meal. 06/13/13  Yes Cresenciano Genre, MD  methocarbamol (ROBAXIN) 500 MG tablet Take 1 tablet (500 mg total) by mouth 2 (two) times daily. 05/07/13  Yes Hannah Muthersbaugh, PA-C  omeprazole (PRILOSEC) 20 MG capsule Take 1 capsule (20 mg total) by mouth daily. 05/01/13  Yes Cresenciano Genre, MD  potassium chloride (K-DUR) 10 MEQ tablet Take 1 tablet (10 mEq total) by mouth daily. 07/25/12  Yes Cresenciano Genre, MD  pravastatin (PRAVACHOL) 40 MG tablet Take 1 tablet (40 mg total) by mouth daily. 05/01/13  Yes Cresenciano Genre, MD  warfarin (COUMADIN) 5 MG tablet Take 2.5-5 mg by mouth at bedtime. Take 5mg  on Sunday and Thursday. All other days take 2.5mg .   Yes Cresenciano Genre, MD   BP 136/79  Pulse 93  Temp(Src) 98.8 F (37.1 C) (Oral)  Resp 13  SpO2 99%  LMP 06/03/1978 Physical Exam   Constitutional: She is oriented to person, place, and time. She appears well-developed and well-nourished. No distress.  HENT:  Head: Normocephalic and atraumatic.  Mouth/Throat: No oropharyngeal exudate.  Eyes: Conjunctivae are normal. Pupils are equal, round, and reactive to light. No scleral icterus.  Neck: Normal range of motion. No tracheal deviation present. No thyromegaly present.  Cardiovascular: Normal rate, regular rhythm and normal heart sounds.  Exam reveals no gallop and no friction rub.   No murmur heard. Pulmonary/Chest: Effort normal and breath sounds normal. No stridor. No respiratory distress. She has no wheezes. She has no rales. She exhibits no tenderness.  Abdominal: Soft. She exhibits no distension and no mass. There is tenderness (epigastric). There is no rebound and no guarding. Hernia confirmed negative in the right inguinal area and confirmed negative in the left inguinal area.  Genitourinary: No labial fusion. There is no rash, tenderness, lesion or injury on the right labia. There is no rash, tenderness, lesion or injury on the left labia. Uterus is not deviated, not enlarged, not fixed and not tender. Cervix exhibits no motion tenderness, no discharge and no friability. Right adnexum displays no mass, no tenderness and no fullness. Left adnexum displays no mass, no tenderness and no fullness. No erythema, tenderness or bleeding around the vagina. No foreign body around the vagina. No signs of injury around the vagina. No vaginal discharge found.  Musculoskeletal: Normal range of motion. She exhibits no edema.  Lymphadenopathy:       Right: No inguinal adenopathy present.       Left: No inguinal adenopathy present.  Neurological: She is alert and oriented to person, place, and time.  Skin: Skin is warm and dry.  She is not diaphoretic.    ED Course  Procedures (including critical care time) Labs Review Labs Reviewed  BASIC METABOLIC PANEL - Abnormal; Notable for  the following:    Glucose, Bld 285 (*)    GFR calc non Af Amer 74 (*)    GFR calc Af Amer 86 (*)    All other components within normal limits  PRO B NATRIURETIC PEPTIDE - Abnormal; Notable for the following:    Pro B Natriuretic peptide (BNP) 615.4 (*)    All other components within normal limits  WET PREP, GENITAL  TROPONIN I  CBC  PROTIME-INR    Imaging Review Dg Chest 2 View  06/26/2013   CLINICAL DATA:  Chest pain  EXAM: CHEST  2 VIEW  COMPARISON:  04/28/2012  FINDINGS: Stable mild cardiomegaly. Normal vascularity without CHF or pneumonia. No edema, effusion or pneumothorax. Trachea is midline. Atherosclerosis noted of the aorta. Minor thoracic degenerative change. Cholecystectomy clips present.  IMPRESSION: Mild cardiomegaly without acute process.  Stable exam   Electronically Signed   By: Daryll Brod M.D.   On: 06/26/2013 14:57   MDM   Final diagnoses:  None    This is a 63 y.o. female with PMH of fibromyalgia, depression, obesity, multiple abdominal hernias status post repair, hyperlipidemia, cholecystitis status post collate cystectomy, atrial fib on Coumadin,  presenting with abdominal pain. Onset yesterday evening. Located epigastric. Resolved yesterday evening, but returned this morning upon awakening. Sharp, mild. Radiates to back. Negative for weakness, numbness, tingling. She states that she sweated last night. Positive for chronic nausea, without vomiting.  The patient has been having regular bowel movements without hematochezia or melena. Negative for dysuria or hematuria. Positive for white discharge vaginally.   Date: 06/26/2013  Rate: 104  Rhythm: atrial fibrillation  QRS Axis: normal  Intervals: normal  ST/T Wave abnormalities: nonspecific ST changes  Conduction Disutrbances:none  Narrative Interpretation:   Old EKG Reviewed: Slower rate today compared to 04/08/12    HR is around 90 when I present to bedside.  Blood pressure is stable, patient is mentating  well, patient is afebrile with normal respiratory rate. Positive for epigastric tenderness to palpation, without rebound, rigidity, or guarding. Pulses are equal in all extremities and patient has no neurologic deficits.  At this time, I do not believe the patient is suffering from ACS, PE, PTX, PNA, pericarditis, tamponade, dissection, aneurysm, or esophageal rupture.  I also doubt obstruction, perforation, ischemia, or emergent inflammation at this time. We'll perform pelvic examination to investigate vaginal discharge further, we'll perform KUB. If within normal limits, I anticipate discharge with PCP followup.   Pelvic exam WNL.  KUB without acute findings.  Wet prep reveals bacterial vaginosis.  Pt stable for discharge with Rx for metronidazole, FU.  All questions answered.  Return precautions given.  I have discussed case and care has been guided by my attending physician, Dr. Venora Maples.  Doy Hutching, MD 06/27/13 [id number]

## 2013-06-26 NOTE — Discharge Instructions (Signed)
Bacterial Vaginosis Bacterial vaginosis is an infection of the vagina. It happens when too many of certain germs (bacteria) grow in the vagina. HOME CARE  Take your medicine as told by your doctor.  Finish your medicine even if you start to feel better.  Do not have sex until you finish your medicine and are better.  Tell your sex partner that you have an infection. They should see their doctor for treatment.  Practice safe sex. Use condoms. Have only one sex partner. GET HELP IF:  You are not getting better after 3 days of treatment.  You have more grey fluid (discharge) coming from your vagina than before.  You have more pain than before.  You have a fever. MAKE SURE YOU:   Understand these instructions.  Will watch your condition.  Will get help right away if you are not doing well or get worse. Document Released: 12/03/2007 Document Revised: 12/14/2012 Document Reviewed: 10/05/2012 [employer] Patient Information 2014 [employer].  

## 2013-06-26 NOTE — ED Notes (Signed)
Pt comfortable with discharge and follow up instructions. Prescriptions x1 

## 2013-06-26 NOTE — ED Notes (Signed)
Dr. Harper at bedside. 

## 2013-06-26 NOTE — ED Notes (Signed)
C/o chest pain onset last pm states she felt nausea, clammy

## 2013-06-27 NOTE — ED Provider Notes (Signed)
I saw and evaluated the patient, reviewed the resident's note and I agree with the findings and plan.   EKG Interpretation None      Well appearing. abd exam benign.   Hoy Morn, MD 06/27/13 2141

## 2013-06-30 ENCOUNTER — Ambulatory Visit (INDEPENDENT_AMBULATORY_CARE_PROVIDER_SITE_OTHER): Payer: Medicare Other | Admitting: *Deleted

## 2013-06-30 DIAGNOSIS — G459 Transient cerebral ischemic attack, unspecified: Secondary | ICD-10-CM

## 2013-06-30 DIAGNOSIS — I4891 Unspecified atrial fibrillation: Secondary | ICD-10-CM | POA: Diagnosis not present

## 2013-06-30 DIAGNOSIS — Z7901 Long term (current) use of anticoagulants: Secondary | ICD-10-CM | POA: Diagnosis not present

## 2013-06-30 DIAGNOSIS — Z5181 Encounter for therapeutic drug level monitoring: Secondary | ICD-10-CM

## 2013-06-30 LAB — POCT INR: INR: 1.9

## 2013-07-04 ENCOUNTER — Ambulatory Visit (INDEPENDENT_AMBULATORY_CARE_PROVIDER_SITE_OTHER): Payer: Medicare Other | Admitting: *Deleted

## 2013-07-04 DIAGNOSIS — G459 Transient cerebral ischemic attack, unspecified: Secondary | ICD-10-CM | POA: Diagnosis not present

## 2013-07-04 DIAGNOSIS — I4891 Unspecified atrial fibrillation: Secondary | ICD-10-CM

## 2013-07-04 DIAGNOSIS — Z5181 Encounter for therapeutic drug level monitoring: Secondary | ICD-10-CM | POA: Diagnosis not present

## 2013-07-04 DIAGNOSIS — Z7901 Long term (current) use of anticoagulants: Secondary | ICD-10-CM

## 2013-07-04 LAB — POCT INR: INR: 1.6

## 2013-07-25 DIAGNOSIS — H538 Other visual disturbances: Secondary | ICD-10-CM | POA: Diagnosis not present

## 2013-07-25 DIAGNOSIS — E119 Type 2 diabetes mellitus without complications: Secondary | ICD-10-CM | POA: Diagnosis not present

## 2013-08-03 ENCOUNTER — Ambulatory Visit (INDEPENDENT_AMBULATORY_CARE_PROVIDER_SITE_OTHER): Payer: Medicare Other | Admitting: Internal Medicine

## 2013-08-03 ENCOUNTER — Encounter: Payer: Self-pay | Admitting: Internal Medicine

## 2013-08-03 ENCOUNTER — Ambulatory Visit (HOSPITAL_COMMUNITY)
Admission: RE | Admit: 2013-08-03 | Discharge: 2013-08-03 | Disposition: A | Discharge status: Home / Self Care | Payer: Medicare Other | Source: Ambulatory Visit | Attending: Internal Medicine | Admitting: Internal Medicine

## 2013-08-03 VITALS — BP 118/70 | HR 101 | Temp 97.8°F | Ht 63.0 in | Wt 205.1 lb

## 2013-08-03 DIAGNOSIS — K439 Ventral hernia without obstruction or gangrene: Secondary | ICD-10-CM

## 2013-08-03 DIAGNOSIS — S99929A Unspecified injury of unspecified foot, initial encounter: Secondary | ICD-10-CM | POA: Diagnosis not present

## 2013-08-03 DIAGNOSIS — M79609 Pain in unspecified limb: Secondary | ICD-10-CM | POA: Insufficient documentation

## 2013-08-03 DIAGNOSIS — G8929 Other chronic pain: Secondary | ICD-10-CM | POA: Diagnosis not present

## 2013-08-03 DIAGNOSIS — F3289 Other specified depressive episodes: Secondary | ICD-10-CM | POA: Diagnosis not present

## 2013-08-03 DIAGNOSIS — W57XXXA Bitten or stung by nonvenomous insect and other nonvenomous arthropods, initial encounter: Secondary | ICD-10-CM

## 2013-08-03 DIAGNOSIS — F32A Depression, unspecified: Secondary | ICD-10-CM

## 2013-08-03 DIAGNOSIS — M549 Dorsalgia, unspecified: Secondary | ICD-10-CM | POA: Diagnosis not present

## 2013-08-03 DIAGNOSIS — E119 Type 2 diabetes mellitus without complications: Secondary | ICD-10-CM

## 2013-08-03 DIAGNOSIS — M79671 Pain in right foot: Secondary | ICD-10-CM | POA: Insufficient documentation

## 2013-08-03 DIAGNOSIS — T148 Other injury of unspecified body region: Secondary | ICD-10-CM

## 2013-08-03 DIAGNOSIS — F329 Major depressive disorder, single episode, unspecified: Secondary | ICD-10-CM | POA: Diagnosis not present

## 2013-08-03 DIAGNOSIS — S99919A Unspecified injury of unspecified ankle, initial encounter: Secondary | ICD-10-CM | POA: Diagnosis not present

## 2013-08-03 DIAGNOSIS — S8990XA Unspecified injury of unspecified lower leg, initial encounter: Secondary | ICD-10-CM | POA: Diagnosis not present

## 2013-08-03 HISTORY — DX: Pain in right foot: M79.671

## 2013-08-03 LAB — GLUCOSE, CAPILLARY: Glucose-Capillary: 266 mg/dL — ABNORMAL HIGH (ref 70–99)

## 2013-08-03 MED ORDER — AMITRIPTYLINE HCL 50 MG PO TABS: 50.0000 mg | ORAL_TABLET | Freq: Every day | ORAL | Status: DC

## 2013-08-03 MED ORDER — HYDROCODONE-ACETAMINOPHEN 5-325 MG PO TABS: 1.0000 | ORAL_TABLET | Freq: Four times a day (QID) | ORAL | Status: DC | PRN

## 2013-08-03 MED ORDER — CYCLOBENZAPRINE HCL 5 MG PO TABS: 5.0000 mg | ORAL_TABLET | Freq: Two times a day (BID) | ORAL | Status: DC | PRN

## 2013-08-03 MED ORDER — MUPIROCIN 2 % EX OINT: 1.0000 "application " | TOPICAL_OINTMENT | Freq: Two times a day (BID) | CUTANEOUS | Status: DC

## 2013-08-03 NOTE — Progress Notes (Signed)
Subjective:    Patient ID: [name], female    DOB: 07-Mar-1951, 63 y.o.   MRN: [id number]  HPI Comments: 63 y.o female PMH depression, obesity, skin neoplasm (right leg), carotid stenosis, Fibromyalgia, Internal hemorrhoid, GERD (gastroesophageal reflux disease), Chronic gastritis, Transaminase or LDH elevation, Hyperlipidemia (LDL 58 01/2013), Fatty liver, Chronic pain (back pain), chronic h/a, chronic abdominal pain, diverticulosis (follow with Dr. Sharlett Iles GI), Diabetic peripheral neuropathy, Dysphagia with h/o strictures and dilation, Hypertension (controlled BP 118/70), Cholecystitis s/p cholecystectomy, Sarcoidosis, CHF (congestive heart failure), Atrial fibrillation on Coumadin (INR 1.6 07/04/13 followed by cardiology Dr. Percival Spanish. She has appt for echo 08/08/13 and f/u INR), OSA (obstructive sleep apnea) w/o cpap, Exertional dyspnea, Hypothyroidism, Type II diabetes mellitus (HA1C 8.1 06/13/13 with cbg today 266), H/O hiatal hernia, h/o ventral hernia with complex repair, epileptic seizure (as a child), Osteoarthritis, diverticulosis, digoxin drug-induced hypotension and bradycardia, borderline osteopenia 2010.   1) Chronic low back pain daily. Repeat MRI 05/2013 with T12-L3 negative; L3-L4 mild facet arthrosis w/o stenosis; L4-L5 disc protrusion and moderate facet hypertrophy which can result in mild right neural foraminal narrowing w/o spinal stenosis; L5-S1 with mild facet arthrosis w/o stenosis.  S/p epidural injection last month (06/2013) which helped for 3.5 weeks and now pain is back and she takes narcotics with relief.    2) DM history-HA1C slightly deteriorated 06/2013. Patient is taking Metformin 500 mg bid. Reports noncompliant with diet at times.   3) She c/o 2 areas on her back where she was bitten by 2 ticks.  Areas seem to be itching. She previously took Doxycycline after tick bite   4) She has h/o complex abdominal hernia repair. She wants to f/u with surgery Dr. Dalbert Batman to make  sure everything is going wel.    5) She needs Rx refill of Elavil, Flexeril, narcotic (since she is here and lives out of town Rx due for next month.  She asks her Rx refills be in #90 day supply   6) She c/o for dorsal foot pain after fall 05/2013.  ED imaged right ankle 05/2013 which was negative.  She c/o intermittent edema to right ankle and certain shoes (i.e laced shoes like sneakers) cause the right foot pain to be 10/10. Pain is worse with walking and certain shoes.         SH:  She just returned from [place] seeing her 2 grandchildren and her daughter.       Review of Systems  Constitutional: Negative for appetite change.  Respiratory: Negative for shortness of breath.   Cardiovascular: Negative for chest pain and leg swelling.  Gastrointestinal:       +constipation with narcotics sometimes   Genitourinary: Negative for dysuria.       Objective:   Physical Exam  Nursing note and vitals reviewed. Constitutional: She is oriented to person, place, and time. Vital signs are normal. She appears well-developed and well-nourished. She is cooperative. No distress.  HENT:  Head: Normocephalic and atraumatic.  Mouth/Throat: Oropharynx is clear and moist and mucous membranes are normal. Abnormal dentition. No oropharyngeal exudate.  Eyes: Conjunctivae are normal. Pupils are equal, round, and reactive to light. Right eye exhibits no discharge. Left eye exhibits no discharge. No scleral icterus.  Cardiovascular: Normal rate, S1 normal, S2 normal and normal heart sounds.  An irregularly irregular rhythm present.  No murmur heard. AF noted on exam  No lower ext edema   Pulmonary/Chest: Effort normal and breath sounds normal. No respiratory distress. She  has no wheezes.  Abdominal: Soft. Bowel sounds are normal. There is no tenderness.  Obese linear scar to abdomen  Musculoskeletal:       Feet:  Neurological: She is alert and oriented to person, place, and time. Gait normal.  Skin: Skin  is warm and dry. No rash noted. She is not diaphoretic.     Psychiatric: She has a normal mood and affect. Her speech is normal and behavior is normal. Judgment and thought content normal. Cognition and memory are normal.          Assessment & Plan:  F/u early July 2015

## 2013-08-03 NOTE — Patient Instructions (Addendum)
General Instructions: Take Calcium 1200 mg daily and vitamin D 1000 IU per day for bone health  I will see you in early July for follow up  Get an Xray of your right foot today Take care  Try Bactroban to your wounds on your back 2 x per day    Treatment Goals:  Goals (1 Years of Data) as of 08/03/13         As of Today 06/26/13 06/26/13 06/26/13 06/26/13     Blood Pressure    . Blood Pressure < 140/90  118/70 138/90 138/90 142/82 143/82     Result Component    . HEMOGLOBIN A1C < 7.0            Progress Toward Treatment Goals:  Treatment Goal 08/03/2013  Hemoglobin A1C deteriorated  Blood pressure at goal  Prevent falls at goal    Self Care Goals & Plans:  Self Care Goal 08/03/2013  Manage my medications take my medicines as prescribed; bring my medications to every visit; refill my medications on time; follow the sick day instructions if I am sick  Monitor my health keep track of my blood glucose; bring my glucose meter and log to each visit  Eat healthy foods drink diet soda or water instead of juice or soda; eat more vegetables; eat foods that are low in salt; eat baked foods instead of fried foods; eat fruit for snacks and desserts; eat smaller portions  Be physically active find an activity I enjoy  Prevent falls -  Meeting treatment goals maintain the current self-care plan    Home Blood Glucose Monitoring 06/13/2013  Check my blood sugar no home glucose monitoring  When to check my blood sugar N/A     Care Management & Community Referrals:  Referral 08/03/2013  Referrals made for care management support -  Referrals made to community resources none        Hypertension As your heart beats, it forces blood through your arteries. This force is your blood pressure. If the pressure is too high, it is called hypertension (HTN) or high blood pressure. HTN is dangerous because you may have it and not know it. High blood pressure may mean that your heart has to work  harder to pump blood. Your arteries may be narrow or stiff. The extra work puts you at risk for heart disease, stroke, and other problems.  Blood pressure consists of two numbers, a higher number over a lower, 110/72, for example. It is stated as "110 over 72." The ideal is below 120 for the top number (systolic) and under 80 for the bottom (diastolic). Write down your blood pressure today. You should pay close attention to your blood pressure if you have certain conditions such as:  Heart failure.  Prior heart attack.  Diabetes  Chronic kidney disease.  Prior stroke.  Multiple risk factors for heart disease. To see if you have HTN, your blood pressure should be measured while you are seated with your arm held at the level of the heart. It should be measured at least twice. A one-time elevated blood pressure reading (especially in the Emergency Department) does not mean that you need treatment. There may be conditions in which the blood pressure is different between your right and left arms. It is important to see your caregiver soon for a recheck. Most people have essential hypertension which means that there is not a specific cause. This type of high blood pressure may be lowered by changing  lifestyle factors such as:  Stress.  Smoking.  Lack of exercise.  Excessive weight.  Drug/tobacco/alcohol use.  Eating less salt. Most people do not have symptoms from high blood pressure until it has caused damage to the body. Effective treatment can often prevent, delay or reduce that damage. TREATMENT  When a cause has been identified, treatment for high blood pressure is directed at the cause. There are a large number of medications to treat HTN. These fall into several categories, and your caregiver will help you select the medicines that are best for you. Medications may have side effects. You should review side effects with your caregiver. If your blood pressure stays high after you have  made lifestyle changes or started on medicines,   Your medication(s) may need to be changed.  Other problems may need to be addressed.  Be certain you understand your prescriptions, and know how and when to take your medicine.  Be sure to follow up with your caregiver within the time frame advised (usually within two weeks) to have your blood pressure rechecked and to review your medications.  If you are taking more than one medicine to lower your blood pressure, make sure you know how and at what times they should be taken. Taking two medicines at the same time can result in blood pressure that is too low. SEEK IMMEDIATE MEDICAL CARE IF:  You develop a severe headache, blurred or changing vision, or confusion.  You have unusual weakness or numbness, or a faint feeling.  You have severe chest or abdominal pain, vomiting, or breathing problems. MAKE SURE YOU:   Understand these instructions.  Will watch your condition.  Will get help right away if you are not doing well or get worse. Document Released: 02/23/2005 Document Revised: 05/18/2011 Document Reviewed: 10/14/2007 [facility] Patient Information 2014 [place].  Diabetes and Exercise Exercising regularly is important. It is not just about losing weight. It has many health benefits, such as:  Improving your overall fitness, flexibility, and endurance.  Increasing your bone density.  Helping with weight control.  Decreasing your body fat.  Increasing your muscle strength.  Reducing stress and tension.  Improving your overall health. People with diabetes who exercise gain additional benefits because exercise:  Reduces appetite.  Improves the body's use of blood sugar (glucose).  Helps lower or control blood glucose.  Decreases blood pressure.  Helps control blood lipids (such as cholesterol and triglycerides).  Improves the body's use of the hormone insulin by:  Increasing the body's insulin  sensitivity.  Reducing the body's insulin needs.  Decreases the risk for heart disease because exercising:  Lowers cholesterol and triglycerides levels.  Increases the levels of good cholesterol (such as high-density lipoproteins [HDL]) in the body.  Lowers blood glucose levels. YOUR ACTIVITY PLAN  Choose an activity that you enjoy and set realistic goals. Your health care provider or diabetes educator can help you make an activity plan that works for you. You can break activities into 2 or 3 sessions throughout the day. Doing so is as good as one long session. Exercise ideas include:  Taking the dog for a walk.  Taking the stairs instead of the elevator.  Dancing to your favorite song.  Doing your favorite exercise with a friend. RECOMMENDATIONS FOR EXERCISING WITH TYPE 1 OR TYPE 2 DIABETES   Check your blood glucose before exercising. If blood glucose levels are greater than 240 mg/dL, check for urine ketones. Do not exercise if ketones are present.  Avoid injecting insulin into areas of the body that are going to be exercised. For example, avoid injecting insulin into:  The arms when playing tennis.  The legs when jogging.  Keep a record of:  Food intake before and after you exercise.  Expected peak times of insulin action.  Blood glucose levels before and after you exercise.  The type and amount of exercise you have done.  Review your records with your health care provider. Your health care provider will help you to develop guidelines for adjusting food intake and insulin amounts before and after exercising.  If you take insulin or oral hypoglycemic agents, watch for signs and symptoms of hypoglycemia. They include:  Dizziness.  Shaking.  Sweating.  Chills.  Confusion.  Drink plenty of water while you exercise to prevent dehydration or heat stroke. Body water is lost during exercise and must be replaced.  Talk to your health care provider before starting  an exercise program to make sure it is safe for you. Remember, almost any type of activity is better than none. Document Released: 05/16/2003 Document Revised: 10/26/2012 Document Reviewed: 08/02/2012 [facility] Patient Information 2014 [place].

## 2013-08-03 NOTE — Assessment & Plan Note (Signed)
Lab Results  Component Value Date   HGBA1C 8.1 06/13/2013   HGBA1C 6.6 04/03/2013   HGBA1C 6.1 01/12/2013     Assessment: Diabetes control: fair control Progress toward A1C goal:  deteriorated Comments: diet noncompliance noted today   Plan: Medications:  continue current medications (Metfomrin 500 mg bid) Home glucose monitoring:none  Instruction/counseling given: provided printed educational material Educational resources provided: brochure Self management tools provided: other (see comments) Other plans: if still elevated in 2 months will increase Metformin to 1000 mg bid

## 2013-08-03 NOTE — Assessment & Plan Note (Signed)
Referred back to Dr. Dalbert Batman for check up after hernia repair. Hernia stable no acute issues

## 2013-08-03 NOTE — Addendum Note (Signed)
Addended by: Cresenciano Genre on: 08/03/2013 03:12 PM   Modules accepted: Orders

## 2013-08-03 NOTE — Assessment & Plan Note (Signed)
Rx refill Elavil #90 day supply today

## 2013-08-03 NOTE — Assessment & Plan Note (Signed)
2 areas to back which are red but w/o fluctuance. Areas of previous tick bites x 2  Will have pt apply topical Bactroban bid

## 2013-08-03 NOTE — Assessment & Plan Note (Signed)
Etiology noted on previous MRI.   Chronic Rx refill Norco #120 today and Flexeril x 90 day supply today

## 2013-08-03 NOTE — Assessment & Plan Note (Addendum)
Noted since fall 05/2013. Previous ankle imaging neg Will do foot xray to r/o fracture showed possibly avulsion injury-will refer to Sports Medicine Pt is borderline osteopenia from previous DEXA will have her take Vitamin D 1000 IU and Calcium 1200 mg qd

## 2013-08-04 ENCOUNTER — Ambulatory Visit (INDEPENDENT_AMBULATORY_CARE_PROVIDER_SITE_OTHER): Payer: Medicare Other | Admitting: *Deleted

## 2013-08-04 ENCOUNTER — Other Ambulatory Visit (HOSPITAL_COMMUNITY): Payer: Self-pay | Admitting: Cardiology

## 2013-08-04 DIAGNOSIS — I4891 Unspecified atrial fibrillation: Secondary | ICD-10-CM | POA: Diagnosis not present

## 2013-08-04 DIAGNOSIS — G459 Transient cerebral ischemic attack, unspecified: Secondary | ICD-10-CM | POA: Diagnosis not present

## 2013-08-04 DIAGNOSIS — I6529 Occlusion and stenosis of unspecified carotid artery: Secondary | ICD-10-CM

## 2013-08-04 DIAGNOSIS — Z7901 Long term (current) use of anticoagulants: Secondary | ICD-10-CM | POA: Diagnosis not present

## 2013-08-04 DIAGNOSIS — Z5181 Encounter for therapeutic drug level monitoring: Secondary | ICD-10-CM

## 2013-08-04 LAB — POCT INR: INR: 2.1

## 2013-08-04 NOTE — Progress Notes (Signed)
Case discussed with Dr. McLean soon after the resident saw the patient.  We reviewed the resident's history and exam and pertinent patient test results.  I agree with the assessment, diagnosis, and plan of care documented in the resident's note. 

## 2013-08-07 ENCOUNTER — Other Ambulatory Visit (HOSPITAL_COMMUNITY): Payer: Self-pay | Admitting: Cardiology

## 2013-08-07 DIAGNOSIS — I6529 Occlusion and stenosis of unspecified carotid artery: Secondary | ICD-10-CM

## 2013-08-08 ENCOUNTER — Encounter (INDEPENDENT_AMBULATORY_CARE_PROVIDER_SITE_OTHER): Payer: Medicare Other

## 2013-08-08 ENCOUNTER — Encounter (HOSPITAL_COMMUNITY): Payer: Medicare Other

## 2013-08-08 DIAGNOSIS — I6529 Occlusion and stenosis of unspecified carotid artery: Secondary | ICD-10-CM | POA: Diagnosis not present

## 2013-08-10 ENCOUNTER — Encounter: Payer: Self-pay | Admitting: Internal Medicine

## 2013-08-10 DIAGNOSIS — M62830 Muscle spasm of back: Secondary | ICD-10-CM

## 2013-08-10 HISTORY — DX: Muscle spasm of back: M62.830

## 2013-08-16 ENCOUNTER — Ambulatory Visit (INDEPENDENT_AMBULATORY_CARE_PROVIDER_SITE_OTHER): Payer: Medicare Other | Admitting: Emergency Medicine

## 2013-08-16 ENCOUNTER — Encounter: Payer: Self-pay | Admitting: Emergency Medicine

## 2013-08-16 VITALS — BP 124/76 | Ht 63.0 in | Wt 200.0 lb

## 2013-08-16 DIAGNOSIS — M19079 Primary osteoarthritis, unspecified ankle and foot: Secondary | ICD-10-CM | POA: Diagnosis not present

## 2013-08-16 DIAGNOSIS — G8929 Other chronic pain: Secondary | ICD-10-CM | POA: Diagnosis not present

## 2013-08-16 DIAGNOSIS — S93409A Sprain of unspecified ligament of unspecified ankle, initial encounter: Secondary | ICD-10-CM | POA: Diagnosis not present

## 2013-08-16 DIAGNOSIS — R269 Unspecified abnormalities of gait and mobility: Secondary | ICD-10-CM | POA: Diagnosis not present

## 2013-08-16 DIAGNOSIS — M549 Dorsalgia, unspecified: Secondary | ICD-10-CM | POA: Diagnosis not present

## 2013-08-16 DIAGNOSIS — I4891 Unspecified atrial fibrillation: Secondary | ICD-10-CM | POA: Diagnosis not present

## 2013-08-16 DIAGNOSIS — I10 Essential (primary) hypertension: Secondary | ICD-10-CM | POA: Diagnosis not present

## 2013-08-16 DIAGNOSIS — E119 Type 2 diabetes mellitus without complications: Secondary | ICD-10-CM | POA: Diagnosis not present

## 2013-08-16 DIAGNOSIS — J069 Acute upper respiratory infection, unspecified: Secondary | ICD-10-CM | POA: Diagnosis not present

## 2013-08-16 MED ORDER — DICLOFENAC SODIUM 1 % TD GEL: 2.0000 g | Freq: Four times a day (QID) | TRANSDERMAL | Status: DC

## 2013-08-16 NOTE — Assessment & Plan Note (Signed)
Patient given body helix ankle compressive sleeve which compresses over the dorsum of the midfoot as well. In addition she was given a prescription for Voltaren gel. She has any difficulty filling this prescription she will call and we will change. Followup as needed. Review the x-rays with her in the office today.

## 2013-08-16 NOTE — Progress Notes (Signed)
Patient ID: [name], female   DOB: 03/12/1950, 63 y.o.   MRN: [id number] 63 year old female presents with right midfoot pain. She sustained a fall after slipping on the ice in March and has had persistent pain since that time. X-rays in the emergency room at that time revealed a questionable avulsion fractures of the mid foot. Patient has worn them ASO lace up ankle brace for support but states this is nothing to alleviate her symptoms. Pain worse with prolonged ambulation improves  with rest. No radiation of pain.  Mid foot is mildly swollen at times, no ankle swelling or no toe swelling.  No numbness or tingling  Pertinent past medical history: Hypertension, atrial fibrillation, diabetes  Social history: Nonsmoker, no alcohol  Examination: BP 124/76  Ht 5\' 3"  (1.6 m)  Wt 200 lb (90.719 kg)  BMI 35.44 kg/m2  LMP 06/03/1978 Well-developed well-nourished 63 year old female awake alert oriented no acute distress  Examination of her right foot reveals mild swelling across the midfoot. Mild tenderness to palpation of the dorsum of the midfoot.   No ankle swelling tenderness to palpation or instability.  Neurovascularly intact and equal pulses in her bilateral lower extremities  X-rays from her emergency department visit reveal mild midfoot arthritis as well as the dorsal small avulsion fractures

## 2013-08-18 DIAGNOSIS — T1490XA Injury, unspecified, initial encounter: Secondary | ICD-10-CM | POA: Diagnosis not present

## 2013-08-18 DIAGNOSIS — S0990XA Unspecified injury of head, initial encounter: Secondary | ICD-10-CM | POA: Diagnosis not present

## 2013-08-18 DIAGNOSIS — I517 Cardiomegaly: Secondary | ICD-10-CM | POA: Diagnosis not present

## 2013-08-18 DIAGNOSIS — I6529 Occlusion and stenosis of unspecified carotid artery: Secondary | ICD-10-CM | POA: Diagnosis not present

## 2013-08-18 DIAGNOSIS — K4090 Unilateral inguinal hernia, without obstruction or gangrene, not specified as recurrent: Secondary | ICD-10-CM | POA: Diagnosis not present

## 2013-08-18 DIAGNOSIS — S20219A Contusion of unspecified front wall of thorax, initial encounter: Secondary | ICD-10-CM | POA: Diagnosis not present

## 2013-08-18 DIAGNOSIS — K5730 Diverticulosis of large intestine without perforation or abscess without bleeding: Secondary | ICD-10-CM | POA: Diagnosis not present

## 2013-08-18 DIAGNOSIS — R079 Chest pain, unspecified: Secondary | ICD-10-CM | POA: Diagnosis not present

## 2013-08-18 DIAGNOSIS — S3981XA Other specified injuries of abdomen, initial encounter: Secondary | ICD-10-CM | POA: Diagnosis not present

## 2013-08-18 DIAGNOSIS — S301XXA Contusion of abdominal wall, initial encounter: Secondary | ICD-10-CM | POA: Diagnosis not present

## 2013-08-18 DIAGNOSIS — S298XXA Other specified injuries of thorax, initial encounter: Secondary | ICD-10-CM | POA: Diagnosis not present

## 2013-08-24 ENCOUNTER — Other Ambulatory Visit: Payer: Self-pay | Admitting: Internal Medicine

## 2013-08-25 ENCOUNTER — Ambulatory Visit (INDEPENDENT_AMBULATORY_CARE_PROVIDER_SITE_OTHER): Payer: Medicare Other | Admitting: *Deleted

## 2013-08-25 DIAGNOSIS — G459 Transient cerebral ischemic attack, unspecified: Secondary | ICD-10-CM

## 2013-08-25 DIAGNOSIS — I4891 Unspecified atrial fibrillation: Secondary | ICD-10-CM

## 2013-08-25 DIAGNOSIS — Z7901 Long term (current) use of anticoagulants: Secondary | ICD-10-CM | POA: Diagnosis not present

## 2013-08-25 DIAGNOSIS — Z5181 Encounter for therapeutic drug level monitoring: Secondary | ICD-10-CM | POA: Diagnosis not present

## 2013-08-25 LAB — POCT INR: INR: 2.2

## 2013-09-04 ENCOUNTER — Encounter (HOSPITAL_COMMUNITY): Payer: Self-pay | Admitting: Emergency Medicine

## 2013-09-04 ENCOUNTER — Emergency Department (HOSPITAL_COMMUNITY)
Admission: EM | Admit: 2013-09-04 | Discharge: 2013-09-05 | Disposition: A | Discharge status: Home / Self Care | Payer: Medicare Other | Attending: Emergency Medicine | Admitting: Emergency Medicine

## 2013-09-04 ENCOUNTER — Emergency Department (HOSPITAL_COMMUNITY): Payer: Medicare Other

## 2013-09-04 ENCOUNTER — Telehealth: Payer: Self-pay | Admitting: *Deleted

## 2013-09-04 DIAGNOSIS — G4733 Obstructive sleep apnea (adult) (pediatric): Secondary | ICD-10-CM | POA: Insufficient documentation

## 2013-09-04 DIAGNOSIS — Z91040 Latex allergy status: Secondary | ICD-10-CM | POA: Diagnosis not present

## 2013-09-04 DIAGNOSIS — Z9851 Tubal ligation status: Secondary | ICD-10-CM | POA: Diagnosis not present

## 2013-09-04 DIAGNOSIS — Z792 Long term (current) use of antibiotics: Secondary | ICD-10-CM | POA: Diagnosis not present

## 2013-09-04 DIAGNOSIS — E1149 Type 2 diabetes mellitus with other diabetic neurological complication: Secondary | ICD-10-CM | POA: Diagnosis not present

## 2013-09-04 DIAGNOSIS — Z9089 Acquired absence of other organs: Secondary | ICD-10-CM | POA: Insufficient documentation

## 2013-09-04 DIAGNOSIS — Z8739 Personal history of other diseases of the musculoskeletal system and connective tissue: Secondary | ICD-10-CM | POA: Diagnosis not present

## 2013-09-04 DIAGNOSIS — R0600 Dyspnea, unspecified: Secondary | ICD-10-CM

## 2013-09-04 DIAGNOSIS — Z85828 Personal history of other malignant neoplasm of skin: Secondary | ICD-10-CM | POA: Diagnosis not present

## 2013-09-04 DIAGNOSIS — R0609 Other forms of dyspnea: Secondary | ICD-10-CM | POA: Diagnosis not present

## 2013-09-04 DIAGNOSIS — Z90711 Acquired absence of uterus with remaining cervical stump: Secondary | ICD-10-CM | POA: Insufficient documentation

## 2013-09-04 DIAGNOSIS — Z8673 Personal history of transient ischemic attack (TIA), and cerebral infarction without residual deficits: Secondary | ICD-10-CM | POA: Insufficient documentation

## 2013-09-04 DIAGNOSIS — K219 Gastro-esophageal reflux disease without esophagitis: Secondary | ICD-10-CM | POA: Diagnosis not present

## 2013-09-04 DIAGNOSIS — Z880 Allergy status to penicillin: Secondary | ICD-10-CM | POA: Diagnosis not present

## 2013-09-04 DIAGNOSIS — Z79899 Other long term (current) drug therapy: Secondary | ICD-10-CM | POA: Insufficient documentation

## 2013-09-04 DIAGNOSIS — Z9889 Other specified postprocedural states: Secondary | ICD-10-CM | POA: Insufficient documentation

## 2013-09-04 DIAGNOSIS — I10 Essential (primary) hypertension: Secondary | ICD-10-CM | POA: Insufficient documentation

## 2013-09-04 DIAGNOSIS — Z9981 Dependence on supplemental oxygen: Secondary | ICD-10-CM | POA: Insufficient documentation

## 2013-09-04 DIAGNOSIS — G40309 Generalized idiopathic epilepsy and epileptic syndromes, not intractable, without status epilepticus: Secondary | ICD-10-CM | POA: Diagnosis not present

## 2013-09-04 DIAGNOSIS — R0602 Shortness of breath: Secondary | ICD-10-CM | POA: Diagnosis not present

## 2013-09-04 DIAGNOSIS — G319 Degenerative disease of nervous system, unspecified: Secondary | ICD-10-CM | POA: Diagnosis not present

## 2013-09-04 DIAGNOSIS — E785 Hyperlipidemia, unspecified: Secondary | ICD-10-CM | POA: Diagnosis not present

## 2013-09-04 DIAGNOSIS — E039 Hypothyroidism, unspecified: Secondary | ICD-10-CM | POA: Insufficient documentation

## 2013-09-04 DIAGNOSIS — Z791 Long term (current) use of non-steroidal anti-inflammatories (NSAID): Secondary | ICD-10-CM | POA: Insufficient documentation

## 2013-09-04 DIAGNOSIS — M1990 Unspecified osteoarthritis, unspecified site: Secondary | ICD-10-CM | POA: Diagnosis not present

## 2013-09-04 DIAGNOSIS — R1013 Epigastric pain: Secondary | ICD-10-CM

## 2013-09-04 DIAGNOSIS — F3289 Other specified depressive episodes: Secondary | ICD-10-CM | POA: Insufficient documentation

## 2013-09-04 DIAGNOSIS — F329 Major depressive disorder, single episode, unspecified: Secondary | ICD-10-CM | POA: Diagnosis not present

## 2013-09-04 DIAGNOSIS — I509 Heart failure, unspecified: Secondary | ICD-10-CM | POA: Diagnosis not present

## 2013-09-04 DIAGNOSIS — R0989 Other specified symptoms and signs involving the circulatory and respiratory systems: Secondary | ICD-10-CM | POA: Insufficient documentation

## 2013-09-04 DIAGNOSIS — G3289 Other specified degenerative disorders of nervous system in diseases classified elsewhere: Secondary | ICD-10-CM | POA: Insufficient documentation

## 2013-09-04 DIAGNOSIS — E669 Obesity, unspecified: Secondary | ICD-10-CM | POA: Diagnosis not present

## 2013-09-04 DIAGNOSIS — R109 Unspecified abdominal pain: Secondary | ICD-10-CM | POA: Diagnosis not present

## 2013-09-04 LAB — BASIC METABOLIC PANEL
BUN: 8 mg/dL (ref 6–23)
CO2: 28 mEq/L (ref 19–32)
Calcium: 9.2 mg/dL (ref 8.4–10.5)
Chloride: 97 mEq/L (ref 96–112)
Creatinine, Ser: 0.67 mg/dL (ref 0.50–1.10)
GFR calc non Af Amer: >90 mL/min (ref >90)
Glucose, Bld: 175 mg/dL — ABNORMAL HIGH (ref 70–99)
Potassium: 4.1 mEq/L (ref 3.7–5.3)
Sodium: 139 mEq/L (ref 137–147)

## 2013-09-04 LAB — CBC
HCT: 37.6 % (ref 36.0–46.0)
Hemoglobin: 12.5 g/dL (ref 12.0–15.0)
MCH: 30 pg (ref 26.0–34.0)
MCHC: 33.2 g/dL (ref 30.0–36.0)
MCV: 90.2 fL (ref 78.0–100.0)
Platelets: 218 10*3/uL (ref 150–400)
RBC: 4.17 MIL/uL (ref 3.87–5.11)
RDW: 13.1 % (ref 11.5–15.5)
WBC: 5.6 10*3/uL (ref 4.0–10.5)

## 2013-09-04 LAB — I-STAT TROPONIN, ED: TROPONIN I, POC: 0 ng/mL (ref 0.00–0.08)

## 2013-09-04 LAB — LIPASE, BLOOD: Lipase: 37 U/L (ref 11–59)

## 2013-09-04 LAB — TROPONIN I: Troponin I: <0.3 ng/mL (ref <0.30)

## 2013-09-04 LAB — PRO B NATRIURETIC PEPTIDE: Pro B Natriuretic peptide (BNP): 989.5 pg/mL — ABNORMAL HIGH (ref 0–125)

## 2013-09-04 MED ORDER — DIPHENHYDRAMINE HCL 50 MG/ML IJ SOLN
25.0000 mg | Freq: Once | INTRAMUSCULAR | Status: AC
  Administered 2013-09-04: 25 mg via INTRAVENOUS
  Filled 2013-09-04: qty 1

## 2013-09-04 MED ORDER — METOCLOPRAMIDE HCL 5 MG/ML IJ SOLN
10.0000 mg | Freq: Once | INTRAMUSCULAR | Status: AC
  Administered 2013-09-04: 10 mg via INTRAVENOUS
  Filled 2013-09-04: qty 2
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
## 2013-09-05 MED ORDER — OMEPRAZOLE 20 MG PO CPDR: 20.0000 mg | DELAYED_RELEASE_CAPSULE | Freq: Every day | ORAL | Status: DC

## 2013-09-05 NOTE — ED Provider Notes (Signed)
CSN: [id number]     Arrival date & time 09/04/13  1503 History   First MD Initiated Contact with Patient 09/04/13 1821     Chief Complaint  Patient presents with  . Abdominal Pain  . Headache     (Consider location/radiation/quality/duration/timing/severity/associated sxs/prior Treatment) HPI Comments: Pt comes in with cc of chest pain. Epigastric chest pain, intermittently radiating to the back. Pt has multiple medical problems. Reports that she has had this epigastric abd pain for several days, now more persistent. She also has been having exertional dyspnea and some orthopnea like sx. She is taking lasix and denies any weight gain. No hx of PE or DVT. Pt is on coumadin for afib. Pt also has reqf esophageal dilation.  Patient is a 63 y.o. female presenting with abdominal pain and headaches. The history is provided by the patient.  Abdominal Pain Associated symptoms: shortness of breath   Associated symptoms: no chest pain, no constipation, no cough, no diarrhea, no hematuria, no nausea and no vomiting   Headache Associated symptoms: abdominal pain   Associated symptoms: no cough, no diarrhea, no nausea, no neck pain and no vomiting     Past Medical History  Diagnosis Date  . Depression   . Obesity   . Skin neoplasm   . Bunion   . Carotid stenosis   . Fibromyalgia   . Internal hemorrhoid   . GERD (gastroesophageal reflux disease)   . Chronic gastritis   . Transaminase or LDH elevation   . Mild cognitive impairment   . Hyperlipidemia   . Fatty liver   . Lumbar back pain   . Diabetic peripheral neuropathy   . Dysphagia     no documented strictures but responded positively to dilation in past.   . Hypertension   . Cholecystitis     s/p cholecystectomy  . Sarcoidosis   . Fibromyalgia   . CHF (congestive heart failure)   . Skin cancer 1990's    "front of my right leg"  . Atrial fibrillation   . OSA (obstructive sleep apnea) 2003    "can't sleep w/that darm machine"  .  Exertional dyspnea   . Hypothyroidism   . Type II diabetes mellitus   . TIA (transient ischemic attack)     07/29/11 pt denies this history  . H/O hiatal hernia   . Epileptic seizure, tonic     .No meds since age of 35.  . Osteoarthritis   . Stroke     tia  . Hypothyroid   . Diverticulosis   . Digoxin toxicity     08/2011  . Bradycardia, drug induced     08/2011 (dig)  . Drug-induced hypotension     08/2011 (Dig)   Past Surgical History  Procedure Laterality Date  . Hiatal hernia repair  1990    "had to have scar tissue removed 6 months after repair"  . Esophageal dilation    . Cataract extraction w/phaco  10/27/2010    Procedure: CATARACT EXTRACTION PHACO AND INTRAOCULAR LENS PLACEMENT (IOC);  Surgeon: Williams Che;  Location: [facility];  Service: Ophthalmology;  Laterality: Left;  CDE- 1.78  . Cataract extraction w/phaco  07/13/2011    Procedure: CATARACT EXTRACTION PHACO AND INTRAOCULAR LENS PLACEMENT (IOC);  Surgeon: Williams Che, MD;  Location: [facility];  Service: Ophthalmology;  Laterality: Right;  CDE:  1.65  . Liposuction  1992  . Inverted nipples  1992  . Breast surgery  ~ 2010    excision milk  duct; right breast  . Foot surgery  ~ 2011    ~straightened toe left foot & scraped bone below big toe"  . Foot surgery  ~ 2011    "scraped bone of big toe; shortened middle toe; right foot"  . Cholecystectomy  1980's  . Dilation and curettage of uterus  1970; 1976  . Abdominal hysterectomy  1980's    partial  . Tubal ligation  1976  . Bilateral oophorectomy  1980's    "after partial hysterectomy"  . Skin cancer excision  1990's    "front side of right shin"  . Ventral hernia repair  07/29/2011    Procedure: LAPAROSCOPIC VENTRAL HERNIA;  Surgeon: Adin Hector, MD;  Location: [place];  Service: General;  Laterality: N/A;  multiple incarcerated hernias with mesh  . Other surgical history      mutiple ab surgeries for abdominal hernia  . Other surgical history       multiple dilation of esophagus  . Other surgical history      right lens implant 07/2011, left lens implant 10/2010  . Other surgical history      right breast bx=benign   Family History  Problem Relation Age of Onset  . Crohn's disease Mother   . Colitis Mother     Crohns  . Anesthesia problems Mother   . Arthritis Mother     rheumatoid; maternal side of family also with RA  . Cancer Father     skin  . Diabetes Father   . Heart disease Father   . Melanoma Father   . Coronary artery disease Father   . Diabetes Sister   . Depression Maternal Uncle   . Dementia Maternal Uncle   . Hypotension Neg Hx   . Malignant hyperthermia Neg Hx   . Pseudochol deficiency Neg Hx   . Colon cancer Paternal Uncle     ? if stomach or colon    History  Substance Use Topics  . Smoking status: Never Smoker   . Smokeless tobacco: Never Used  . Alcohol Use: No   OB History   Grav Para Term Preterm Abortions TAB SAB Ect Mult Living                 Review of Systems  Constitutional: Negative for diaphoresis and activity change.  HENT: Negative for facial swelling.   Respiratory: Positive for shortness of breath. Negative for cough and wheezing.   Cardiovascular: Negative for chest pain.  Gastrointestinal: Positive for abdominal pain. Negative for nausea, vomiting, diarrhea, constipation, blood in stool and abdominal distention.  Genitourinary: Negative for hematuria and difficulty urinating.  Musculoskeletal: Negative for neck pain.  Skin: Negative for color change.  Neurological: Negative for speech difficulty and headaches.  Hematological: Does not bruise/bleed easily.  Psychiatric/Behavioral: Negative for confusion.      Allergies  Gemfibrozil; Latex; Penicillins; Adhesive; Codeine; Ace inhibitors; and Digoxin and related  Home Medications   Prior to Admission medications   Medication Sig Start Date End Date Taking? Authorizing Provider  amitriptyline (ELAVIL) 50 MG tablet Take 1  tablet (50 mg total) by mouth at bedtime. 08/03/13  Yes Cresenciano Genre, MD  atenolol (TENORMIN) 25 MG tablet Take 1.5 tablets (37.5 mg total) by mouth 2 (two) times daily. 05/01/13  Yes Cresenciano Genre, MD  calcium carbonate (OS-CAL) 600 MG TABS Take 600 mg by mouth 2 (two) times daily with a meal.    Yes Historical Provider, MD  cyclobenzaprine (FLEXERIL) 5 MG  tablet Take 1 tablet (5 mg total) by mouth 2 (two) times daily as needed. For fibromyalgia 08/03/13  Yes Cresenciano Genre, MD  diclofenac sodium (VOLTAREN) 1 % GEL Apply 2 g topically 4 (four) times daily. 08/16/13  Yes Hennie Duos, MD  diltiazem (DILACOR XR) 180 MG 24 hr capsule Take 1 capsule (180 mg total) by mouth daily. 07/05/12  Yes Cresenciano Genre, MD  FLUoxetine (PROZAC) 20 MG tablet Take 1 tablet (20 mg total) by mouth daily.   Yes Cresenciano Genre, MD  furosemide (LASIX) 20 MG tablet Take 1 tablet (20 mg total) by mouth daily.   Yes Cresenciano Genre, MD  gabapentin (NEURONTIN) 100 MG capsule Take 100 mg by mouth 2 (two) times daily. 07/05/12  Yes Cresenciano Genre, MD  HYDROcodone-acetaminophen (NORCO/VICODIN) 5-325 MG per tablet Take 1 tablet by mouth every 6 (six) hours as needed for moderate pain. 08/03/13  Yes Cresenciano Genre, MD  ibuprofen (ADVIL,MOTRIN) 800 MG tablet Take 800 mg by mouth every 8 (eight) hours as needed for moderate pain.    Yes Historical Provider, MD  levothyroxine (SYNTHROID, LEVOTHROID) 75 MCG tablet Take 1 tablet (75 mcg total) by mouth every morning.   Yes Cresenciano Genre, MD  losartan (COZAAR) 25 MG tablet Take 0.5 tablets (12.5 mg total) by mouth daily. 05/01/13  Yes Cresenciano Genre, MD  metFORMIN (GLUCOPHAGE) 500 MG tablet Take 1 tablet (500 mg total) by mouth 2 (two) times daily with a meal. 06/13/13  Yes Cresenciano Genre, MD  mupirocin ointment (BACTROBAN) 2 % Place 1 application into the nose 2 (two) times daily. 08/03/13  Yes Cresenciano Genre, MD  omeprazole (PRILOSEC) 20 MG capsule Take 1 capsule (20 mg total) by mouth  daily. 05/01/13  Yes Cresenciano Genre, MD  potassium chloride (K-DUR) 10 MEQ tablet Take 1 tablet (10 mEq total) by mouth daily. 07/25/12  Yes Cresenciano Genre, MD  pravastatin (PRAVACHOL) 40 MG tablet Take 1 tablet (40 mg total) by mouth daily. 05/01/13  Yes Cresenciano Genre, MD  warfarin (COUMADIN) 5 MG tablet Take 2.5-5 mg by mouth at bedtime. Take 5mg  on Sunday and Thursday. All other days take 2.5mg .   Yes Cresenciano Genre, MD  omeprazole (PRILOSEC) 20 MG capsule Take 1 capsule (20 mg total) by mouth daily. 09/05/13   Ankit Kathrynn Humble, MD   BP 139/82  Pulse 100  Temp(Src) 98.6 F (37 C) (Oral)  Resp 23  Ht 5\' 3"  (1.6 m)  Wt 200 lb (90.719 kg)  BMI 35.44 kg/m2  SpO2 100%  LMP 06/03/1978 Physical Exam  Nursing note and vitals reviewed. Constitutional: She is oriented to person, place, and time. She appears well-developed and well-nourished.  HENT:  Head: Normocephalic and atraumatic.  Eyes: EOM are normal. Pupils are equal, round, and reactive to light.  Neck: Neck supple.  Cardiovascular: Normal rate, regular rhythm and normal heart sounds.   No murmur heard. Pulmonary/Chest: Effort normal. No respiratory distress.  Abdominal: Soft. She exhibits no distension. There is no tenderness. There is no rebound and no guarding.  Neurological: She is alert and oriented to person, place, and time.  Skin: Skin is warm and dry.    ED Course  Procedures (including critical care time) Labs Review Labs Reviewed  BASIC METABOLIC PANEL - Abnormal; Notable for the following:    Glucose, Bld 175 (*)    All other components within normal limits  PRO B NATRIURETIC PEPTIDE - Abnormal;  Notable for the following:    Pro B Natriuretic peptide (BNP) 989.5 (*)    All other components within normal limits  CBC  LIPASE, BLOOD  TROPONIN I  Randolm Idol, ED    Imaging Review Dg Chest 2 View  09/04/2013   CLINICAL DATA:  Headache.  Shortness of breath.  EXAM: CHEST  2 VIEW  COMPARISON:  Multiple exams,  including 08/18/2013  FINDINGS: Mild cardiomegaly with prominent epicardial adipose tissue noted. Mild atherosclerotic calcification in the aortic arch.  The lungs appear clear. Mild thoracic spondylosis. No pleural effusion.  IMPRESSION: 1. Mild cardiomegaly, without edema.   Electronically Signed   By: Sherryl Barters M.D.   On: 09/04/2013 16:41   Dg Abd 1 View  09/04/2013   CLINICAL DATA:  Chest pain and headache. Right-sided abdominal pain.  EXAM: ABDOMEN - 1 VIEW  COMPARISON:  08/18/2013.  FINDINGS: Air-filled loops of small bowel are noted. Small bowel loops measure up to 3.0 cm in caliber. Moderate stool burden identified within the colon. Gas is identified up to the level of the rectum. Cholecystectomy clips are present in the right upper quadrant of the abdomen. No radio-opaque calculi or other significant radiographic abnormality are seen.  IMPRESSION: 1. Nonspecific bowel gas pattern without evidence for small bowel obstruction. 2. Moderate stool burden within the colon.   Electronically Signed   By: Kerby Moors M.D.   On: 09/04/2013 19:44     EKG Interpretation   Date/Time:  Monday September 04 2013 15:30:44 EDT Ventricular Rate:  100 PR Interval:    QRS Duration: 74 QT Interval:  356 QTC Calculation: 459 R Axis:   -9 Text Interpretation:  Atrial fibrillation Abnormal ECG No acute changes  Confirmed by NANAVATI, MD, ANKIT ([id number]) on 09/05/2013 12:09:54 AM      MDM   Final diagnoses:  Epigastric abdominal pain  Dyspnea    Pt comes in with epigastric chest pain, radiates to the back. GI labs and lipase is neg. Will request GI f/u. ? PUD. Trop x 2 ordered, ekg has no acute changes, HEART score is 2 for the risk factors. Cards f/u will be given as well.  Pt also has some orthopnea and DOE. BNP is slightly elevated. Cards f/u should be beneficial for possible repeat echo.  Pt has no tachycardia, no PE, DVT hx and no leg swelling and is on coumadin - making PE highly  unlikely.     Varney Biles, MD 09/05/13 [id number]

## 2013-09-05 NOTE — Discharge Instructions (Signed)
Shortness of Breath Shortness of breath means you have trouble breathing. It could also mean that you have a medical problem. You should get immediate medical care for shortness of breath. CAUSES   Not enough oxygen in the air such as with high altitudes or a smoke-filled room.  Certain lung diseases, infections, or problems.  Heart disease or conditions, such as angina or heart failure.  Low red blood cells (anemia).  Poor physical fitness, which can cause shortness of breath when you exercise.  Chest or back injuries or stiffness.  Being overweight.  Smoking.  Anxiety, which can make you feel like you are not getting enough air. DIAGNOSIS  Serious medical problems can often be found during your physical exam. Tests may also be done to determine why you are having shortness of breath. Tests may include:  Chest X-rays.  Lung function tests.  Blood tests.  An electrocardiogram (ECG).  An ambulatory electrocardiogram. An ambulatory ECG records your heartbeat patterns over a 24-hour period.  Exercise testing.  A transthoracic echocardiogram (TTE). During echocardiography, sound waves are used to evaluate how blood flows through your heart.  A transesophageal echocardiogram (TEE).  Imaging scans. Your health care provider may not be able to find a cause for your shortness of breath after your exam. In this case, it is important to have a follow-up exam with your health care provider as directed.  TREATMENT  Treatment for shortness of breath depends on the cause of your symptoms and can vary greatly. HOME CARE INSTRUCTIONS   Do not smoke. Smoking is a common cause of shortness of breath. If you smoke, ask for help to quit.  Avoid being around chemicals or things that may bother your breathing, such as paint fumes and dust.  Rest as needed. Slowly resume your usual activities.  If medicines were prescribed, take them as directed for the full length of time directed. This  includes oxygen and any inhaled medicines.  Keep all follow-up appointments as directed by your health care provider. SEEK MEDICAL CARE IF:   Your condition does not improve in the time expected.  You have a hard time doing your normal activities even with rest.  You have any new symptoms. SEEK IMMEDIATE MEDICAL CARE IF:   Your shortness of breath gets worse.  You feel light-headed, faint, or develop a cough not controlled with medicines.  You start coughing up blood.  You have pain with breathing.  You have chest pain or pain in your arms, shoulders, or abdomen.  You have a fever.  You are unable to walk up stairs or exercise the way you normally do. MAKE SURE YOU:  Understand these instructions.  Will watch your condition.  Will get help right away if you are not doing well or get worse. Document Released: 11/18/2000 Document Revised: 02/28/2013 Document Reviewed: 05/11/2011 [facility] Patient Information 2015 [place], [place]. This information is not intended to replace advice given to you by your health care provider. Make sure you discuss any questions you have with your health care provider.  Abdominal Pain Many things can cause abdominal pain. Usually, abdominal pain is not caused by a disease and will improve without treatment. It can often be observed and treated at home. Your health care provider will do a physical exam and possibly order blood tests and X-rays to help determine the seriousness of your pain. However, in many cases, more time must pass before a clear cause of the pain can be found. Before that point, your  health care provider may not know if you need more testing or further treatment. HOME CARE INSTRUCTIONS  Monitor your abdominal pain for any changes. The following actions may help to alleviate any discomfort you are experiencing:  Only take over-the-counter or prescription medicines as directed by your health care provider.  Do not take laxatives  unless directed to do so by your health care provider.  Try a clear liquid diet (broth, tea, or water) as directed by your health care provider. Slowly move to a bland diet as tolerated. SEEK MEDICAL CARE IF:  You have unexplained abdominal pain.  You have abdominal pain associated with nausea or diarrhea.  You have pain when you urinate or have a bowel movement.  You experience abdominal pain that wakes you in the night.  You have abdominal pain that is worsened or improved by eating food.  You have abdominal pain that is worsened with eating fatty foods.  You have a fever. SEEK IMMEDIATE MEDICAL CARE IF:   Your pain does not go away within 2 hours.  You keep throwing up (vomiting).  Your pain is felt only in portions of the abdomen, such as the right side or the left lower portion of the abdomen.  You pass bloody or black tarry stools. MAKE SURE YOU:  Understand these instructions.   Will watch your condition.   Will get help right away if you are not doing well or get worse.  Document Released: 12/03/2004 Document Revised: 02/28/2013 Document Reviewed: 11/02/2012 [facility] Patient Information 2015 [place], [place]. This information is not intended to replace advice given to you by your health care provider. Make sure you discuss any questions you have with your health care provider.

## 2013-09-07 ENCOUNTER — Encounter: Payer: Self-pay | Admitting: Internal Medicine

## 2013-09-07 ENCOUNTER — Ambulatory Visit (INDEPENDENT_AMBULATORY_CARE_PROVIDER_SITE_OTHER): Payer: Medicare Other | Admitting: Internal Medicine

## 2013-09-07 VITALS — BP 136/75 | HR 112 | Temp 97.2°F | Ht 63.0 in | Wt 205.0 lb

## 2013-09-07 DIAGNOSIS — Z23 Encounter for immunization: Secondary | ICD-10-CM

## 2013-09-07 DIAGNOSIS — E039 Hypothyroidism, unspecified: Secondary | ICD-10-CM | POA: Diagnosis not present

## 2013-09-07 DIAGNOSIS — Z0000 Encounter for general adult medical examination without abnormal findings: Secondary | ICD-10-CM | POA: Diagnosis not present

## 2013-09-07 DIAGNOSIS — M549 Dorsalgia, unspecified: Secondary | ICD-10-CM | POA: Diagnosis not present

## 2013-09-07 DIAGNOSIS — Z5987 Material hardship due to limited financial resources, not elsewhere classified: Secondary | ICD-10-CM | POA: Diagnosis not present

## 2013-09-07 DIAGNOSIS — G8929 Other chronic pain: Secondary | ICD-10-CM | POA: Diagnosis not present

## 2013-09-07 DIAGNOSIS — R51 Headache: Secondary | ICD-10-CM

## 2013-09-07 DIAGNOSIS — I4891 Unspecified atrial fibrillation: Secondary | ICD-10-CM | POA: Diagnosis not present

## 2013-09-07 DIAGNOSIS — R300 Dysuria: Secondary | ICD-10-CM | POA: Diagnosis not present

## 2013-09-07 DIAGNOSIS — I6529 Occlusion and stenosis of unspecified carotid artery: Secondary | ICD-10-CM

## 2013-09-07 DIAGNOSIS — E119 Type 2 diabetes mellitus without complications: Secondary | ICD-10-CM | POA: Diagnosis not present

## 2013-09-07 DIAGNOSIS — E1149 Type 2 diabetes mellitus with other diabetic neurological complication: Secondary | ICD-10-CM | POA: Diagnosis not present

## 2013-09-07 DIAGNOSIS — E785 Hyperlipidemia, unspecified: Secondary | ICD-10-CM | POA: Diagnosis not present

## 2013-09-07 DIAGNOSIS — F329 Major depressive disorder, single episode, unspecified: Secondary | ICD-10-CM | POA: Diagnosis not present

## 2013-09-07 DIAGNOSIS — K089 Disorder of teeth and supporting structures, unspecified: Secondary | ICD-10-CM | POA: Diagnosis not present

## 2013-09-07 DIAGNOSIS — R1310 Dysphagia, unspecified: Secondary | ICD-10-CM

## 2013-09-07 DIAGNOSIS — F3289 Other specified depressive episodes: Secondary | ICD-10-CM | POA: Diagnosis not present

## 2013-09-07 DIAGNOSIS — E876 Hypokalemia: Secondary | ICD-10-CM | POA: Diagnosis not present

## 2013-09-07 DIAGNOSIS — I10 Essential (primary) hypertension: Secondary | ICD-10-CM | POA: Diagnosis not present

## 2013-09-07 DIAGNOSIS — F32A Depression, unspecified: Secondary | ICD-10-CM

## 2013-09-07 LAB — GLUCOSE, CAPILLARY: Glucose-Capillary: 156 mg/dL — ABNORMAL HIGH (ref 70–99)

## 2013-09-07 LAB — POCT GLYCOSYLATED HEMOGLOBIN (HGB A1C): Hemoglobin A1C: 8.2

## 2013-09-07 MED ORDER — HYDROCODONE-ACETAMINOPHEN 5-325 MG PO TABS: 1.0000 | ORAL_TABLET | Freq: Four times a day (QID) | ORAL | Status: DC | PRN

## 2013-09-07 MED ORDER — METFORMIN HCL 1000 MG PO TABS: 1000.0000 mg | ORAL_TABLET | Freq: Two times a day (BID) | ORAL | Status: DC

## 2013-09-07 MED ORDER — METFORMIN HCL 500 MG PO TABS
1000.0000 mg | ORAL_TABLET | Freq: Two times a day (BID) | ORAL | Status: DC
Start: 2013-09-07 — End: 2013-09-07

## 2013-09-07 MED ORDER — FLUOXETINE HCL 20 MG PO TABS: 20.0000 mg | ORAL_TABLET | Freq: Every day | ORAL | Status: DC
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
## 2013-09-07 NOTE — Patient Instructions (Addendum)
General Instructions: Please f/u with [facility] F/u with me in 3 months, sooner if needed  Take Metformin 1000 mg bid Take care    Treatment Goals:  Goals (1 Years of Data) as of 09/07/13         As of Today 09/05/13 09/04/13 09/04/13 09/04/13     Blood Pressure    . Blood Pressure < 140/90  136/75 132/64 139/82 131/71 124/78     Result Component    . HEMOGLOBIN A1C < 7.0  8.2          Progress Toward Treatment Goals:  Treatment Goal 09/07/2013  Hemoglobin A1C unchanged  Blood pressure at goal  Prevent falls at goal    Self Care Goals & Plans:  Self Care Goal 09/07/2013  Manage my medications take my medicines as prescribed; bring my medications to every visit; refill my medications on time  Monitor my health keep track of my blood pressure; check my feet daily; keep track of my weight  Eat healthy foods drink diet soda or water instead of juice or soda; eat more vegetables; eat foods that are low in salt; eat baked foods instead of fried foods; eat fruit for snacks and desserts; eat smaller portions  Be physically active find an activity I enjoy  Prevent falls -  Meeting treatment goals maintain the current self-care plan    Home Blood Glucose Monitoring 09/07/2013  Check my blood sugar no home glucose monitoring  When to check my blood sugar N/A     Care Management & Community Referrals:  Referral 09/07/2013  Referrals made for care management support none needed  Referrals made to community resources none       Headaches, Frequently Asked Questions MIGRAINE HEADACHES Q: What is migraine? What causes it? How can I treat it? A: Generally, migraine headaches begin as a dull ache. Then they develop into a constant, throbbing, and pulsating pain. You may experience pain at the temples. You may experience pain at the front or back of one or both sides of the head. The pain is usually accompanied by a combination  of:  Nausea.  Vomiting.  Sensitivity to light and noise. Some people (about 15%) experience an aura (see below) before an attack. The cause of migraine is believed to be chemical reactions in the brain. Treatment for migraine may include over-the-counter or prescription medications. It may also include self-help techniques. These include relaxation training and biofeedback.  Q: What is an aura? A: About 15% of people with migraine get an "aura". This is a sign of neurological symptoms that occur before a migraine headache. You may see wavy or jagged lines, dots, or flashing lights. You might experience tunnel vision or blind spots in one or both eyes. The aura can include visual or auditory hallucinations (something imagined). It may include disruptions in smell (such as strange odors), taste or touch. Other symptoms include:  Numbness.  A "pins and needles" sensation.  Difficulty in recalling or speaking the correct word. These neurological events may last as long as 60 minutes. These symptoms will fade as the headache begins. Q: What is a trigger? A: Certain physical or environmental factors can lead to or "trigger" a migraine. These include:  Foods.  Hormonal changes.  Weather.  Stress. It is important to remember that triggers are different for everyone. To help prevent migraine attacks, you need to figure out which triggers affect you. Keep a headache diary. This is a good  way to track triggers. The diary will help you talk to your healthcare professional about your condition. Q: Does weather affect migraines? A: Bright sunshine, hot, humid conditions, and drastic changes in barometric pressure may lead to, or "trigger," a migraine attack in some people. But studies have shown that weather does not act as a trigger for everyone with migraines. Q: What is the link between migraine and hormones? A: Hormones start and regulate many of your body's functions. Hormones keep your body in  balance within a constantly changing environment. The levels of hormones in your body are unbalanced at times. Examples are during menstruation, pregnancy, or menopause. That can lead to a migraine attack. In fact, about three quarters of all women with migraine report that their attacks are related to the menstrual cycle.  Q: Is there an increased risk of stroke for migraine sufferers? A: The likelihood of a migraine attack causing a stroke is very remote. That is not to say that migraine sufferers cannot have a stroke associated with their migraines. In persons under age 46, the most common associated factor for stroke is migraine headache. But over the course of a person's normal life span, the occurrence of migraine headache may actually be associated with a reduced risk of dying from cerebrovascular disease due to stroke.  Q: What are acute medications for migraine? A: Acute medications are used to treat the pain of the headache after it has started. Examples over-the-counter medications, NSAIDs, ergots, and triptans.  Q: What are the triptans? A: Triptans are the newest class of abortive medications. They are specifically targeted to treat migraine. Triptans are vasoconstrictors. They moderate some chemical reactions in the brain. The triptans work on receptors in your brain. Triptans help to restore the balance of a neurotransmitter called serotonin. Fluctuations in levels of serotonin are thought to be a main cause of migraine.  Q: Are over-the-counter medications for migraine effective? A: Over-the-counter, or "OTC," medications may be effective in relieving mild to moderate pain and associated symptoms of migraine. But you should see your caregiver before beginning any treatment regimen for migraine.  Q: What are preventive medications for migraine? A: Preventive medications for migraine are sometimes referred to as "prophylactic" treatments. They are used to reduce the frequency, severity, and  length of migraine attacks. Examples of preventive medications include antiepileptic medications, antidepressants, beta-blockers, calcium channel blockers, and NSAIDs (nonsteroidal anti-inflammatory drugs). Q: Why are anticonvulsants used to treat migraine? A: During the past few years, there has been an increased interest in antiepileptic drugs for the prevention of migraine. They are sometimes referred to as "anticonvulsants". Both epilepsy and migraine may be caused by similar reactions in the brain.  Q: Why are antidepressants used to treat migraine? A: Antidepressants are typically used to treat people with depression. They may reduce migraine frequency by regulating chemical levels, such as serotonin, in the brain.  Q: What alternative therapies are used to treat migraine? A: The term "alternative therapies" is often used to describe treatments considered outside the scope of conventional Western medicine. Examples of alternative therapy include acupuncture, acupressure, and yoga. Another common alternative treatment is herbal therapy. Some herbs are believed to relieve headache pain. Always discuss alternative therapies with your caregiver before proceeding. Some herbal products contain arsenic and other toxins. TENSION HEADACHES Q: What is a tension-type headache? What causes it? How can I treat it? A: Tension-type headaches occur randomly. They are often the result of temporary stress, anxiety, fatigue, or anger. Symptoms include soreness  in your temples, a tightening band-like sensation around your head (a "vice-like" ache). Symptoms can also include a pulling feeling, pressure sensations, and contracting head and neck muscles. The headache begins in your forehead, temples, or the back of your head and neck. Treatment for tension-type headache may include over-the-counter or prescription medications. Treatment may also include self-help techniques such as relaxation training and  biofeedback. CLUSTER HEADACHES Q: What is a cluster headache? What causes it? How can I treat it? A: Cluster headache gets its name because the attacks come in groups. The pain arrives with little, if any, warning. It is usually on one side of the head. A tearing or bloodshot eye and a runny nose on the same side of the headache may also accompany the pain. Cluster headaches are believed to be caused by chemical reactions in the brain. They have been described as the most severe and intense of any headache type. Treatment for cluster headache includes prescription medication and oxygen. SINUS HEADACHES Q: What is a sinus headache? What causes it? How can I treat it? A: When a cavity in the bones of the face and skull (a sinus) becomes inflamed, the inflammation will cause localized pain. This condition is usually the result of an allergic reaction, a tumor, or an infection. If your headache is caused by a sinus blockage, such as an infection, you will probably have a fever. An x-ray will confirm a sinus blockage. Your caregiver's treatment might include antibiotics for the infection, as well as antihistamines or decongestants.  REBOUND HEADACHES Q: What is a rebound headache? What causes it? How can I treat it? A: A pattern of taking acute headache medications too often can lead to a condition known as "rebound headache." A pattern of taking too much headache medication includes taking it more than 2 days per week or in excessive amounts. That means more than the label or a caregiver advises. With rebound headaches, your medications not only stop relieving pain, they actually begin to cause headaches. Doctors treat rebound headache by tapering the medication that is being overused. Sometimes your caregiver will gradually substitute a different type of treatment or medication. Stopping may be a challenge. Regularly overusing a medication increases the potential for serious side effects. Consult a caregiver  if you regularly use headache medications more than 2 days per week or more than the label advises. ADDITIONAL QUESTIONS AND ANSWERS Q: What is biofeedback? A: Biofeedback is a self-help treatment. Biofeedback uses special equipment to monitor your body's involuntary physical responses. Biofeedback monitors:  Breathing.  Pulse.  Heart rate.  Temperature.  Muscle tension.  Brain activity. Biofeedback helps you refine and perfect your relaxation exercises. You learn to control the physical responses that are related to stress. Once the technique has been mastered, you do not need the equipment any more. Q: Are headaches hereditary? A: Four out of five (80%) of people that suffer report a family history of migraine. Scientists are not sure if this is genetic or a family predisposition. Despite the uncertainty, a child has a 50% chance of having migraine if one parent suffers. The child has a 75% chance if both parents suffer.  Q: Can children get headaches? A: By the time they reach high school, most young people have experienced some type of headache. Many safe and effective approaches or medications can prevent a headache from occurring or stop it after it has begun.  Q: What type of doctor should I see to diagnose and treat my  headache? A: Start with your primary caregiver. Discuss his or her experience and approach to headaches. Discuss methods of classification, diagnosis, and treatment. Your caregiver may decide to recommend you to a headache specialist, depending upon your symptoms or other physical conditions. Having diabetes, allergies, etc., may require a more comprehensive and inclusive approach to your headache. The National Head[employer] Foundation will provide, upon request, a list of [facility] physician members in your state. Document Released: 05/16/2003 Document Revised: 05/18/2011 Document Reviewed: 10/24/2007 [facility] Patient Information 2015 [place], [place]. This information is not  intended to replace advice given to you by your health care provider. Make sure you discuss any questions you have with your health care provider.  Back Pain, Adult Low back pain is very common. About 1 in 5 people have back pain.The cause of low back pain is rarely dangerous. The pain often gets better over time.About half of people with a sudden onset of back pain feel better in just 2 weeks. About 8 in 10 people feel better by 6 weeks.  CAUSES Some common causes of back pain include:  Strain of the muscles or ligaments supporting the spine.  Wear and tear (degeneration) of the spinal discs.  Arthritis.  Direct injury to the back. DIAGNOSIS Most of the time, the direct cause of low back pain is not known.However, back pain can be treated effectively even when the exact cause of the pain is unknown.Answering your caregiver's questions about your overall health and symptoms is one of the most accurate ways to make sure the cause of your pain is not dangerous. If your caregiver needs more information, he or she may order lab work or imaging tests (X-rays or MRIs).However, even if imaging tests show changes in your back, this usually does not require surgery. HOME CARE INSTRUCTIONS For many people, back pain returns.Since low back pain is rarely dangerous, it is often a condition that people can learn to [facility] their own.   Remain active. It is stressful on the back to sit or stand in one place. Do not sit, drive, or stand in one place for more than 30 minutes at a time. Take short walks on level surfaces as soon as pain allows.Try to increase the length of time you walk each day.  Do not stay in bed.Resting more than 1 or 2 days can delay your recovery.  Do not avoid exercise or work.Your body is made to move.It is not dangerous to be active, even though your back may hurt.Your back will likely heal faster if you return to being active before your pain is gone.  Pay attention to  your body when you bend and lift. Many people have less discomfortwhen lifting if they bend their knees, keep the load close to their bodies,and avoid twisting. Often, the most comfortable positions are those that put less stress on your recovering back.  Find a comfortable position to sleep. Use a firm mattress and lie on your side with your knees slightly bent. If you lie on your back, put a pillow under your knees.  Only take over-the-counter or prescription medicines as directed by your caregiver. Over-the-counter medicines to reduce pain and inflammation are often the most helpful.Your caregiver may prescribe muscle relaxant drugs.These medicines help dull your pain so you can more quickly return to your normal activities and healthy exercise.  Put ice on the injured area.  Put ice in a plastic bag.  Place a towel between your skin and the bag.  Leave the  ice on for 15-20 minutes, 03-04 times a day for the first 2 to 3 days. After that, ice and heat may be alternated to reduce pain and spasms.  Ask your caregiver about trying back exercises and gentle massage. This may be of some benefit.  Avoid feeling anxious or stressed.Stress increases muscle tension and can worsen back pain.It is important to recognize when you are anxious or stressed and learn ways to manage it.Exercise is a great option. SEEK MEDICAL CARE IF:  You have pain that is not relieved with rest or medicine.  You have pain that does not improve in 1 week.  You have new symptoms.  You are generally not feeling well. SEEK IMMEDIATE MEDICAL CARE IF:   You have pain that radiates from your back into your legs.  You develop new bowel or bladder control problems.  You have unusual weakness or numbness in your arms or legs.  You develop nausea or vomiting.  You develop abdominal pain.  You feel faint. Document Released: 02/23/2005 Document Revised: 08/25/2011 Document Reviewed: 07/14/2010 [employer]  Patient Information 2015 [place], [place]. This information is not intended to replace advice given to you by your health care provider. Make sure you discuss any questions you have with your health care provider.

## 2013-09-08 ENCOUNTER — Encounter: Payer: Self-pay | Admitting: Internal Medicine
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
## 2013-09-08 NOTE — Assessment & Plan Note (Signed)
Controlled. Rx refill of Prozac today

## 2013-09-08 NOTE — Assessment & Plan Note (Signed)
Patient has a h/o dysphagia and esophageal dilation.  She complains of getting choked on her medications at times  Will refer back to Dr. Sharlett Iles for further investigation.

## 2013-09-08 NOTE — Assessment & Plan Note (Signed)
Lab Results  Component Value Date   HGBA1C 8.2 09/07/2013   HGBA1C 8.1 06/13/2013   HGBA1C 6.6 04/03/2013     Assessment: Diabetes control: fair control Progress toward A1C goal:  unchanged Comments: none  Plan: Medications:  increased metformin from 500 mg bid to 1000 mg bid  Home glucose monitoring: Frequency: no home glucose monitoring Timing: N/A Instruction/counseling given: no instruction/counseling  Other plans: f/u in 3 months

## 2013-09-08 NOTE — Assessment & Plan Note (Signed)
Chronic h/a.  Etiology may be related to tension h/a, rebound h/a secondary to narcotics vs migraines vs lack of proper sleep hygiene. She is on Elavil and and beta blocker and still getting headaches Patient is neurologically intact without alarms sx's Will refer to headache and wellness center for f/u of h/a

## 2013-09-08 NOTE — Assessment & Plan Note (Signed)
Pneumonia vaccine given today Will refer for colonoscopy

## 2013-09-11 NOTE — Progress Notes (Signed)
Case discussed with Dr. McLean soon after the resident saw the patient.  We reviewed the resident's history and exam and pertinent patient test results.  I agree with the assessment, diagnosis, and plan of care documented in the resident's note. 

## 2013-09-18 ENCOUNTER — Ambulatory Visit: Payer: Medicare Other | Admitting: Gastroenterology

## 2013-09-20 ENCOUNTER — Ambulatory Visit (INDEPENDENT_AMBULATORY_CARE_PROVIDER_SITE_OTHER): Payer: Medicare Other | Admitting: Sports Medicine

## 2013-09-20 ENCOUNTER — Encounter: Payer: Self-pay | Admitting: Sports Medicine

## 2013-09-20 ENCOUNTER — Ambulatory Visit
Admission: RE | Admit: 2013-09-20 | Discharge: 2013-09-20 | Disposition: A | Discharge status: Home / Self Care | Payer: Medicare Other | Source: Ambulatory Visit | Attending: Sports Medicine | Admitting: Sports Medicine

## 2013-09-20 VITALS — BP 132/72 | Ht 63.0 in | Wt 200.0 lb

## 2013-09-20 DIAGNOSIS — M79671 Pain in right foot: Secondary | ICD-10-CM

## 2013-09-20 DIAGNOSIS — M19079 Primary osteoarthritis, unspecified ankle and foot: Secondary | ICD-10-CM | POA: Diagnosis not present

## 2013-09-20 DIAGNOSIS — E119 Type 2 diabetes mellitus without complications: Secondary | ICD-10-CM | POA: Diagnosis not present

## 2013-09-20 DIAGNOSIS — M79609 Pain in unspecified limb: Secondary | ICD-10-CM | POA: Diagnosis not present

## 2013-09-20 DIAGNOSIS — G8929 Other chronic pain: Secondary | ICD-10-CM | POA: Diagnosis not present

## 2013-09-20 DIAGNOSIS — S93409A Sprain of unspecified ligament of unspecified ankle, initial encounter: Secondary | ICD-10-CM | POA: Diagnosis not present

## 2013-09-20 DIAGNOSIS — I4891 Unspecified atrial fibrillation: Secondary | ICD-10-CM | POA: Diagnosis not present

## 2013-09-20 DIAGNOSIS — R269 Unspecified abnormalities of gait and mobility: Secondary | ICD-10-CM | POA: Diagnosis not present

## 2013-09-20 DIAGNOSIS — I10 Essential (primary) hypertension: Secondary | ICD-10-CM | POA: Diagnosis not present

## 2013-09-20 DIAGNOSIS — J069 Acute upper respiratory infection, unspecified: Secondary | ICD-10-CM | POA: Diagnosis not present

## 2013-09-20 DIAGNOSIS — M549 Dorsalgia, unspecified: Secondary | ICD-10-CM | POA: Diagnosis not present

## 2013-09-20 NOTE — Patient Instructions (Signed)
Please go get 1 different x-ray. Come back to get sports insoles inserted into your shoes to help provide support.
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
## 2013-09-20 NOTE — Assessment & Plan Note (Addendum)
Subacute condition  - patient has underlying long-standing foot issues especially given her bilateral bunion surgery. She has a fairly rigid cavus foot. She is most tender over the area of the avulsion fractures but these do appear stable on x-ray. I feel she is making improvements albeit slowly and this is likely complicated by her underlying fibromyalgia and chronic pain syndrome. 1. I am going to obtain new standing foot films given persistent foot pain in the setting of a traumatic mechanism to evaluate for a Lisfranc injury. 2. Given the rigidity in cavus foot did recommend cushioned sports insoles. She will return for these to be fitted with her sports shoes that have encouraged her to wear these as her primary shoes as she continues to heal from this. 3. Continue with body helix support 4. Continue meds per primary physician > I. will call her with results of the x-rays > Follow up in 4 weeks if normal x-rays, if not improving consider MRI for further evaluation of a Lisfranc joint versus referral to physical therapy

## 2013-09-22 ENCOUNTER — Ambulatory Visit (INDEPENDENT_AMBULATORY_CARE_PROVIDER_SITE_OTHER): Payer: Medicare Other | Admitting: *Deleted

## 2013-09-22 DIAGNOSIS — Z5181 Encounter for therapeutic drug level monitoring: Secondary | ICD-10-CM | POA: Diagnosis not present

## 2013-09-22 DIAGNOSIS — Z7901 Long term (current) use of anticoagulants: Secondary | ICD-10-CM

## 2013-09-22 DIAGNOSIS — I4891 Unspecified atrial fibrillation: Secondary | ICD-10-CM | POA: Diagnosis not present

## 2013-09-22 DIAGNOSIS — G459 Transient cerebral ischemic attack, unspecified: Secondary | ICD-10-CM | POA: Diagnosis not present

## 2013-09-22 LAB — POCT INR: INR: 2.5

## 2013-09-25 ENCOUNTER — Other Ambulatory Visit (INDEPENDENT_AMBULATORY_CARE_PROVIDER_SITE_OTHER): Payer: Self-pay

## 2013-09-25 ENCOUNTER — Other Ambulatory Visit: Payer: Self-pay | Admitting: Internal Medicine

## 2013-09-25 ENCOUNTER — Encounter (INDEPENDENT_AMBULATORY_CARE_PROVIDER_SITE_OTHER): Payer: Self-pay | Admitting: General Surgery

## 2013-09-25 ENCOUNTER — Ambulatory Visit (INDEPENDENT_AMBULATORY_CARE_PROVIDER_SITE_OTHER): Payer: Medicare Other | Admitting: General Surgery

## 2013-09-25 VITALS — BP 120/86 | HR 76 | Temp 97.8°F | Resp 16 | Ht 63.0 in | Wt 205.8 lb

## 2013-09-25 DIAGNOSIS — R109 Unspecified abdominal pain: Secondary | ICD-10-CM | POA: Diagnosis not present

## 2013-09-25 DIAGNOSIS — R1030 Lower abdominal pain, unspecified: Secondary | ICD-10-CM

## 2013-09-25 DIAGNOSIS — Z7901 Long term (current) use of anticoagulants: Secondary | ICD-10-CM

## 2013-09-25 DIAGNOSIS — I4891 Unspecified atrial fibrillation: Secondary | ICD-10-CM

## 2013-09-25 DIAGNOSIS — G459 Transient cerebral ischemic attack, unspecified: Secondary | ICD-10-CM

## 2013-09-25 NOTE — Patient Instructions (Signed)
I do not know why you have developed a centralized abdominal pain. I do not feel a recurrent hernia on exam.  We know that it is not your gallbladder since that has been removed.  You will be scheduled for a CBC, urinalysis, and CT scan of the abdomen to see it if there are any obvious abnormalities.  Return to see Dr. Dalbert Batman in one month. If there are no surgical problems we will probably refer you back to your primary care doctor or to a gastroenterologist.

## 2013-09-25 NOTE — Progress Notes (Signed)
Patient ID: [name], female   DOB: 05-13-1950, 63 y.o.   MRN: [id number]  Chief Complaint  Patient presents with  . Follow-up    hernia    HPI [name] is a 63 y.o. female.  She is self-referred for evaluation of abdominal pain.  Patient has a significant history of a midline laparotomy and hiatal hernia repair 25 years ago. Details unknown. I operated on her on 07/29/2011 at which time I performed laparoscopic lysis of adhesions laparoscopic ventral hernia repair with mesh. Postop course was located by prolonged gastroparesis, abdominal distention requiring NG suction and TNA and she ultimately resolve recurrent resolve that. She is followed by [facility] internal medicine clinic for her numerous medical problems. She is followed by Dr. Percival Spanish  for atrial fibrillation and Coumadin therapy.  She gives a 4 week history of centralized abdominal pain. She seems to point a little bit to the left and a little bit above the umbilicus. She says this bothers her when she moves in certain way. It occurs daily. It is not continuous. She does have some nausea but no vomiting. Bowel movements are normal. She has actually been gaining weight. She has no dysphagia or significant for gout symptoms. She also has back pain with excessive different pain and is not associated with her abdominal pain. She has not been evaluated by any of her other physicians.  Past history significant for atrial fibrillation on Coumadin. Laparoscopic cholecystectomy. Laparoscopic ventral hernia repair with mesh. Open hiatal hernia repair. Hypertension. Anxiety and depression. Diabetes. Diabetic neuropathy. Hypothyroidism. GERD.  She is in no distress today HPI  Past Medical History  Diagnosis Date  . Depression   . Obesity   . Skin neoplasm   . Bunion   . Carotid stenosis   . Fibromyalgia   . Internal hemorrhoid   . GERD (gastroesophageal reflux disease)   . Chronic gastritis   . Transaminase or LDH elevation   .  Mild cognitive impairment   . Hyperlipidemia   . Fatty liver   . Lumbar back pain   . Diabetic peripheral neuropathy   . Dysphagia     no documented strictures but responded positively to dilation in past.   . Hypertension   . Cholecystitis     s/p cholecystectomy  . Sarcoidosis   . Fibromyalgia   . CHF (congestive heart failure)   . Skin cancer 1990's    "front of my right leg"  . Atrial fibrillation   . OSA (obstructive sleep apnea) 2003    "can't sleep w/that darm machine"  . Exertional dyspnea   . Hypothyroidism   . Type II diabetes mellitus   . TIA (transient ischemic attack)     07/29/11 pt denies this history  . H/O hiatal hernia   . Epileptic seizure, tonic     .No meds since age of 41.  . Osteoarthritis   . Stroke     tia  . Hypothyroid   . Diverticulosis   . Digoxin toxicity     08/2011  . Bradycardia, drug induced     08/2011 (dig)  . Drug-induced hypotension     08/2011 (Dig)    Past Surgical History  Procedure Laterality Date  . Hiatal hernia repair  1990    "had to have scar tissue removed 6 months after repair"  . Esophageal dilation    . Cataract extraction w/phaco  10/27/2010    Procedure: CATARACT EXTRACTION PHACO AND INTRAOCULAR LENS PLACEMENT (IOC);  Surgeon:  Williams Che;  Location: [facility];  Service: Ophthalmology;  Laterality: Left;  CDE- 1.78  . Cataract extraction w/phaco  07/13/2011    Procedure: CATARACT EXTRACTION PHACO AND INTRAOCULAR LENS PLACEMENT (IOC);  Surgeon: Williams Che, MD;  Location: [facility];  Service: Ophthalmology;  Laterality: Right;  CDE:  1.65  . Liposuction  1992  . Inverted nipples  1992  . Breast surgery  ~ 2010    excision milk duct; right breast  . Foot surgery  ~ 2011    ~straightened toe left foot & scraped bone below big toe"  . Foot surgery  ~ 2011    "scraped bone of big toe; shortened middle toe; right foot"  . Cholecystectomy  1980's  . Dilation and curettage of uterus  1970; 1976  . Abdominal  hysterectomy  1980's    partial  . Tubal ligation  1976  . Bilateral oophorectomy  1980's    "after partial hysterectomy"  . Skin cancer excision  1990's    "front side of right shin"  . Ventral hernia repair  07/29/2011    Procedure: LAPAROSCOPIC VENTRAL HERNIA;  Surgeon: Adin Hector, MD;  Location: [facility];  Service: General;  Laterality: N/A;  multiple incarcerated hernias with mesh  . Other surgical history      mutiple ab surgeries for abdominal hernia  . Other surgical history      multiple dilation of esophagus  . Other surgical history      right lens implant 07/2011, left lens implant 10/2010  . Other surgical history      right breast bx=benign    Family History  Problem Relation Age of Onset  . Crohn's disease Mother   . Colitis Mother     Crohns  . Anesthesia problems Mother   . Arthritis Mother     rheumatoid; maternal side of family also with RA  . Cancer Father     skin  . Diabetes Father   . Heart disease Father   . Melanoma Father   . Coronary artery disease Father   . Diabetes Sister   . Depression Maternal Uncle   . Dementia Maternal Uncle   . Hypotension Neg Hx   . Malignant hyperthermia Neg Hx   . Pseudochol deficiency Neg Hx   . Colon cancer Paternal Uncle     ? if stomach or colon     Social History History  Substance Use Topics  . Smoking status: Never Smoker   . Smokeless tobacco: Never Used  . Alcohol Use: No    Allergies  Allergen Reactions  . Gemfibrozil Swelling    REACTION: Angioedema  . Latex Other (See Comments)    Blisters where touched or applied  . Penicillins Hives    Will spread in patches all over the body.  . Adhesive [Tape] Other (See Comments)    Will blister skin where applied - do not use BAND-AIDS.  Marland Kitchen Codeine Hives    Will spread in patches all over the body.  . Ace Inhibitors     Cough   . Digoxin And Related     Makes BP drop    Current Outpatient Prescriptions  Medication Sig Dispense Refill  .  amitriptyline (ELAVIL) 50 MG tablet Take 1 tablet (50 mg total) by mouth at bedtime.  90 tablet  1  . atenolol (TENORMIN) 25 MG tablet Take 1.5 tablets (37.5 mg total) by mouth 2 (two) times daily.  135 tablet  4  .  calcium carbonate (OS-CAL) 600 MG TABS Take 600 mg by mouth 2 (two) times daily with a meal.       . cyclobenzaprine (FLEXERIL) 5 MG tablet Take 1 tablet (5 mg total) by mouth 2 (two) times daily as needed. For fibromyalgia  120 tablet  0  . diclofenac sodium (VOLTAREN) 1 % GEL Apply 2 g topically 4 (four) times daily.  1 Tube  2  . diltiazem (DILACOR XR) 180 MG 24 hr capsule Take 1 capsule (180 mg total) by mouth daily.  90 capsule  4  . FLUoxetine (PROZAC) 20 MG tablet Take 1 tablet (20 mg total) by mouth daily.  90 tablet  4  . furosemide (LASIX) 20 MG tablet Take 1 tablet (20 mg total) by mouth daily.  90 tablet  4  . gabapentin (NEURONTIN) 100 MG capsule Take 100 mg by mouth 2 (two) times daily.      Marland Kitchen HYDROcodone-acetaminophen (NORCO/VICODIN) 5-325 MG per tablet Take 1 tablet by mouth every 6 (six) hours as needed for moderate pain.  120 tablet  0  . ibuprofen (ADVIL,MOTRIN) 800 MG tablet Take 800 mg by mouth every 8 (eight) hours as needed for moderate pain.       Marland Kitchen levothyroxine (SYNTHROID, LEVOTHROID) 75 MCG tablet Take 1 tablet (75 mcg total) by mouth every morning.  90 tablet  4  . losartan (COZAAR) 25 MG tablet Take 0.5 tablets (12.5 mg total) by mouth daily.  90 tablet  1  . metFORMIN (GLUCOPHAGE) 1000 MG tablet Take 1 tablet (1,000 mg total) by mouth 2 (two) times daily with a meal.  180 tablet  1  . mupirocin ointment (BACTROBAN) 2 % Place 1 application into the nose 2 (two) times daily.  30 g  0  . omeprazole (PRILOSEC) 20 MG capsule Take 1 capsule (20 mg total) by mouth daily.  90 capsule  4  . omeprazole (PRILOSEC) 20 MG capsule Take 1 capsule (20 mg total) by mouth daily.  30 capsule  0  . potassium chloride (K-DUR) 10 MEQ tablet Take 1 tablet (10 mEq total) by mouth  daily.  90 tablet  4  . pravastatin (PRAVACHOL) 40 MG tablet Take 1 tablet (40 mg total) by mouth daily.  90 tablet  4  . warfarin (COUMADIN) 5 MG tablet Take 2.5-5 mg by mouth at bedtime. Take 5mg  on Sunday and Thursday. All other days take 2.5mg .       No current facility-administered medications for this visit.    Review of Systems Review of Systems  Constitutional: Positive for unexpected weight change. Negative for fever and chills.  HENT: Negative for congestion, hearing loss, sore throat, trouble swallowing and voice change.   Eyes: Negative for visual disturbance.  Respiratory: Negative for cough and wheezing.   Cardiovascular: Positive for palpitations. Negative for chest pain and leg swelling.  Gastrointestinal: Positive for nausea and abdominal pain. Negative for vomiting, diarrhea, constipation, blood in stool, abdominal distention and anal bleeding.  Genitourinary: Negative for hematuria, vaginal bleeding and difficulty urinating.  Musculoskeletal: Positive for back pain. Negative for arthralgias.  Skin: Negative for rash and wound.  Neurological: Positive for numbness. Negative for seizures, syncope and headaches.  Hematological: Negative for adenopathy. Bruises/bleeds easily.  Psychiatric/Behavioral: Negative for confusion. The patient is nervous/anxious.     Blood pressure 120/86, pulse 76, temperature 97.8 F (36.6 C), temperature source Temporal, resp. rate 16, height 5\' 3"  (1.6 m), weight 205 lb 12.8 oz (93.35 kg), last menstrual period 06/03/1978.  Physical Exam Physical Exam  Constitutional: She is oriented to person, place, and time. She appears well-developed and well-nourished. No distress.  Weight 205 pounds. Height 5 feet 3 inches.  HENT:  Head: Normocephalic and atraumatic.  Nose: Nose normal.  Mouth/Throat: No oropharyngeal exudate.  Eyes: Conjunctivae and EOM are normal. Pupils are equal, round, and reactive to light. Left eye exhibits no discharge. No  scleral icterus.  Neck: Neck supple. No JVD present. No tracheal deviation present. No thyromegaly present.  Cardiovascular: Normal rate, regular rhythm, normal heart sounds and intact distal pulses.   No murmur heard. Pulmonary/Chest: Effort normal and breath sounds normal. No respiratory distress. She has no wheezes. She has no rales. She exhibits no tenderness.  Abdominal: Soft. Bowel sounds are normal. She exhibits no distension and no mass. There is no tenderness. There is no rebound and no guarding.  Long midline scar well healed. Mild diffuse nonlocalizing tenderness without any peritoneal signs. No mass. No obvious hernia.  Musculoskeletal: She exhibits edema. She exhibits no tenderness.  Trace ankle edema.  Lymphadenopathy:    She has no cervical adenopathy.  Neurological: She is alert and oriented to person, place, and time. She exhibits normal muscle tone. Coordination normal.  Skin: Skin is warm. No rash noted. She is not diaphoretic. No erythema. No pallor.  Psychiatric: She has a normal mood and affect. Her behavior is normal. Judgment and thought content normal.    Data Reviewed Old records. [facility]  Assessment    Abdominal pain of uncertain etiology. No mass or recurrent hernia on physical exam. Not sure whether this is a visceral or musculoskeletal pain. This is not an acute surgical problem.  Obesity  Atrial fibrillation on Coumadin  Status post laparoscopic cholecystectomy  Status post midline laparotomy for hiatal hernia.  Status post laparoscopic ventral hernia repair with mesh, no recurrence on physical exam  Hypertension  Anxiety depression  Diabetes mellitus with neuropathy  Hypothyroidism   Fibromyalgia  Coronary artery disease    Plan     Long discussion with the patient about differential diagnosis. I told her I would get started on the workup but I was not sure that this was a surgical problem  CT scan abdomen and  pelvis with contrast  CBC and urinalysis  Return to see me in one month.       , M 09/25/2013, 12:35 PM

## 2013-09-27 ENCOUNTER — Encounter (HOSPITAL_COMMUNITY): Payer: Medicare Other

## 2013-09-28 ENCOUNTER — Ambulatory Visit (HOSPITAL_COMMUNITY)
Admission: RE | Admit: 2013-09-28 | Discharge: 2013-09-28 | Disposition: A | Discharge status: Home / Self Care | Payer: Medicare Other | Source: Ambulatory Visit | Attending: General Surgery | Admitting: General Surgery

## 2013-09-28 DIAGNOSIS — K7689 Other specified diseases of liver: Secondary | ICD-10-CM | POA: Diagnosis not present

## 2013-09-28 DIAGNOSIS — R109 Unspecified abdominal pain: Secondary | ICD-10-CM | POA: Diagnosis not present

## 2013-09-28 DIAGNOSIS — K5730 Diverticulosis of large intestine without perforation or abscess without bleeding: Secondary | ICD-10-CM | POA: Insufficient documentation

## 2013-09-28 DIAGNOSIS — K4090 Unilateral inguinal hernia, without obstruction or gangrene, not specified as recurrent: Secondary | ICD-10-CM | POA: Diagnosis not present

## 2013-09-28 DIAGNOSIS — K439 Ventral hernia without obstruction or gangrene: Secondary | ICD-10-CM | POA: Insufficient documentation

## 2013-09-28 MED ORDER — IOHEXOL 300 MG/ML  SOLN
100.0000 mL | Freq: Once | INTRAMUSCULAR | Status: AC | PRN
  Administered 2013-09-28: 100 mL via INTRAVENOUS

## 2013-10-12 DIAGNOSIS — R51 Headache: Secondary | ICD-10-CM | POA: Diagnosis not present

## 2013-10-12 DIAGNOSIS — Z79899 Other long term (current) drug therapy: Secondary | ICD-10-CM | POA: Diagnosis not present

## 2013-10-18 ENCOUNTER — Encounter: Payer: Self-pay | Admitting: Gastroenterology

## 2013-10-19 DIAGNOSIS — G5600 Carpal tunnel syndrome, unspecified upper limb: Secondary | ICD-10-CM | POA: Diagnosis not present

## 2013-10-20 ENCOUNTER — Ambulatory Visit (INDEPENDENT_AMBULATORY_CARE_PROVIDER_SITE_OTHER): Payer: Medicare Other | Admitting: *Deleted

## 2013-10-20 DIAGNOSIS — Z5181 Encounter for therapeutic drug level monitoring: Secondary | ICD-10-CM | POA: Diagnosis not present

## 2013-10-20 DIAGNOSIS — G459 Transient cerebral ischemic attack, unspecified: Secondary | ICD-10-CM | POA: Diagnosis not present

## 2013-10-20 LAB — POCT INR: INR: 2.1

## 2013-10-25 ENCOUNTER — Ambulatory Visit (INDEPENDENT_AMBULATORY_CARE_PROVIDER_SITE_OTHER): Payer: Medicare Other | Admitting: Sports Medicine

## 2013-10-25 ENCOUNTER — Encounter: Payer: Self-pay | Admitting: Sports Medicine

## 2013-10-25 VITALS — BP 133/68 | Ht 63.0 in | Wt 198.0 lb

## 2013-10-25 DIAGNOSIS — M542 Cervicalgia: Secondary | ICD-10-CM | POA: Diagnosis not present

## 2013-10-25 DIAGNOSIS — R51 Headache: Secondary | ICD-10-CM | POA: Diagnosis not present

## 2013-10-25 DIAGNOSIS — IMO0001 Reserved for inherently not codable concepts without codable children: Secondary | ICD-10-CM | POA: Diagnosis not present

## 2013-10-25 DIAGNOSIS — G518 Other disorders of facial nerve: Secondary | ICD-10-CM | POA: Diagnosis not present

## 2013-10-25 DIAGNOSIS — M19079 Primary osteoarthritis, unspecified ankle and foot: Secondary | ICD-10-CM | POA: Diagnosis not present

## 2013-10-25 NOTE — Progress Notes (Signed)
  [name] - 63 y.o. female MRN [id number]  Date of birth: [birth year]  SUBJECTIVE:  Including CC & ROS.  The patient is following up for evaluation of: Right foot pain: Reports pain is significantly improved. She has added gel cushioning without arch support her shoes. She does continue to have significant pain with prolonged standing but is able to tolerate this better. She has discontinued the use of the body helix leave do to swelling associated with this.   HISTORY: Past Medical, Surgical, Social, and Family History Reviewed & Updated per EMR. Pertinent Historical Findings include: Has been seen by Gen. surgery as well as cardiology since her last visit.  DATA REVIEWED: Standing x-ray obtained after last visit. No evidence of Lisfranc involvement.  PHYSICAL EXAM:  VS: BP:133/68 mmHg  HR: bpm  TEMP: ( )  RESP:   HT:5\' 3"  (160 cm)   WT:198 lb (89.812 kg)  BMI:35.1 PHYSICAL EXAM: GENERAL:  Adult Caucasian female. In no discomfort; no respiratory distress  PSYCH:  alert and appropriate, good insight  Right foot Exam:        Gen/Palp:  No significant swelling or effusion. She has mild tenderness palpation over the mid foot with a rigid cavus foot.       ROM/MST:  Minimal mid foot motion. Static longitudinal arch with transverse arch collapse with weightbearing.                      NV:  Pulses are trace in dorsalis pedis and posterior tibialis.  ASSESSMENT & PLAN: See problem based charting & AVS for pt instructions.

## 2013-10-25 NOTE — Patient Instructions (Signed)
Try using the arch support we gave you.  If these work you can order more from [email or web address]

## 2013-10-26 ENCOUNTER — Ambulatory Visit (INDEPENDENT_AMBULATORY_CARE_PROVIDER_SITE_OTHER): Payer: Medicare Other | Admitting: Internal Medicine

## 2013-10-26 ENCOUNTER — Telehealth: Payer: Self-pay

## 2013-10-26 ENCOUNTER — Ambulatory Visit: Payer: Medicare Other | Admitting: Internal Medicine

## 2013-10-26 ENCOUNTER — Encounter: Payer: Self-pay | Admitting: Internal Medicine

## 2013-10-26 VITALS — BP 114/60 | HR 80 | Ht 63.0 in | Wt 197.5 lb

## 2013-10-26 DIAGNOSIS — K219 Gastro-esophageal reflux disease without esophagitis: Secondary | ICD-10-CM

## 2013-10-26 DIAGNOSIS — Z7901 Long term (current) use of anticoagulants: Secondary | ICD-10-CM

## 2013-10-26 DIAGNOSIS — E119 Type 2 diabetes mellitus without complications: Secondary | ICD-10-CM

## 2013-10-26 DIAGNOSIS — I6529 Occlusion and stenosis of unspecified carotid artery: Secondary | ICD-10-CM | POA: Diagnosis not present

## 2013-10-26 DIAGNOSIS — R1310 Dysphagia, unspecified: Secondary | ICD-10-CM

## 2013-10-26 NOTE — Telephone Encounter (Signed)
She needs to be seen in our clinic before we can address any warfarin questions.  She has not been seen since 2013.

## 2013-10-26 NOTE — Progress Notes (Signed)
HISTORY OF PRESENT ILLNESS:  [name] is a 63 y.o. female with MULTIPLE significant medical problems as listed below. These include, but not limited to chronic atrial fibrillation for which she is on Coumadin, and diabetes mellitus, and sleep apnea. She had a long term patient of Dr. Verl Blalock until his retirement. Her history is remarkable for remote open fundoplication 66-59 years ago. About 2 years ago she had repair of several ventral hernias with evidence of partial bowel obstruction. Patient has had periodic upper endoscopies with Dr. Verl Blalock to address dysphagia. No constrictive lesions found, though empiric dilation has helped her swallowing. Her dysphagia is to pools and food. She points to her cervical neck. Her last EGD August 2014 was said to be normal with previous fundoplication noted was dilated with 54 [place] Maloney dilator. She states that this helped her problems with dysphagia until about 6 months ago. Problems have worsened slightly over the past 2 months. No other active GI symptoms at this point. She does inquire as to when she might need followup colonoscopy. Her last colonoscopy was performed in August of 2010. Examination was said to be complete to the cecum with excellent preparation. Moderate diverticulosis present. No polyps or cancers. No family history of colon cancer.  REVIEW OF SYSTEMS:  All non-GI ROS negative except for anxiety, arthritis, back pain, cough, depression, fatigue, headaches, irregular heart rate, night sweats, shortness of breath, sleeping problems, ankle edema, increased thirst  Past Medical History  Diagnosis Date  . Depression   . Obesity   . Skin neoplasm   . Bunion   . Carotid stenosis   . Fibromyalgia   . Internal hemorrhoid   . GERD (gastroesophageal reflux disease)   . Chronic gastritis   . Transaminase or LDH elevation   . Mild cognitive impairment   . Hyperlipidemia   . Fatty liver   . Lumbar back pain   . Diabetic  peripheral neuropathy   . Dysphagia     no documented strictures but responded positively to dilation in past.   . Hypertension   . Cholecystitis     s/p cholecystectomy  . Sarcoidosis   . Fibromyalgia   . CHF (congestive heart failure)   . Skin cancer 1990's    "front of my right leg"  . Atrial fibrillation   . OSA (obstructive sleep apnea) 2003    "can't sleep w/that darm machine"  . Exertional dyspnea   . Hypothyroidism   . Type II diabetes mellitus   . TIA (transient ischemic attack)     07/29/11 pt denies this history  . H/O hiatal hernia   . Epileptic seizure, tonic     .No meds since age of 70.  . Osteoarthritis   . Stroke     tia  . Hypothyroid   . Diverticulosis   . Digoxin toxicity     08/2011  . Bradycardia, drug induced     08/2011 (dig)  . Drug-induced hypotension     08/2011 (Dig)    Past Surgical History  Procedure Laterality Date  . Hiatal hernia repair  1990    "had to have scar tissue removed 6 months after repair"  . Esophageal dilation    . Cataract extraction w/phaco  10/27/2010    Procedure: CATARACT EXTRACTION PHACO AND INTRAOCULAR LENS PLACEMENT (IOC);  Surgeon: Williams Che;  Location: [facility];  Service: Ophthalmology;  Laterality: Left;  CDE- 1.78  . Cataract extraction w/phaco  07/13/2011    Procedure:  CATARACT EXTRACTION PHACO AND INTRAOCULAR LENS PLACEMENT (IOC);  Surgeon: Williams Che, MD;  Location: [facility];  Service: Ophthalmology;  Laterality: Right;  CDE:  1.65  . Liposuction  1992  . Inverted nipples  1992  . Breast surgery  ~ 2010    excision milk duct; right breast  . Foot surgery  ~ 2011    ~straightened toe left foot & scraped bone below big toe"  . Foot surgery  ~ 2011    "scraped bone of big toe; shortened middle toe; right foot"  . Cholecystectomy  1980's  . Dilation and curettage of uterus  1970; 1976  . Abdominal hysterectomy  1980's    partial  . Tubal ligation  1976  . Bilateral oophorectomy  1980's    "after  partial hysterectomy"  . Skin cancer excision  1990's    "front side of right shin"  . Ventral hernia repair  07/29/2011    Procedure: LAPAROSCOPIC VENTRAL HERNIA;  Surgeon: Adin Hector, MD;  Location: [facility];  Service: General;  Laterality: N/A;  multiple incarcerated hernias with mesh  . Other surgical history      mutiple ab surgeries for abdominal hernia  . Other surgical history      multiple dilation of esophagus  . Other surgical history      right lens implant 07/2011, left lens implant 10/2010  . Other surgical history      right breast bx=benign    Social History [name]  reports that she has never smoked. She has never used smokeless tobacco. She reports that she does not drink alcohol or use illicit drugs.  family history includes Anesthesia problems in her mother; Arthritis in her mother; Cancer in her father; Colitis in her mother; Colon cancer in her paternal uncle; Coronary artery disease in her father; Crohn's disease in her mother; Dementia in her maternal uncle; Depression in her maternal uncle; Diabetes in her father and sister; Heart disease in her father; Melanoma in her father. There is no history of Hypotension, Malignant hyperthermia, or Pseudochol deficiency.  Allergies  Allergen Reactions  . Gemfibrozil Swelling    REACTION: Angioedema  . Latex Other (See Comments)    Blisters where touched or applied  . Penicillins Hives    Will spread in patches all over the body.  . Adhesive [Tape] Other (See Comments)    Will blister skin where applied - do not use BAND-AIDS.  Marland Kitchen Codeine Hives    Will spread in patches all over the body.  . Ace Inhibitors     Cough   . Digoxin And Related     Makes BP drop       PHYSICAL EXAMINATION: Vital signs: BP 114/60  Pulse 80  Ht 5\' 3"  (1.6 m)  Wt 197 lb 8 oz (89.585 kg)  BMI 34.99 kg/m2  LMP 06/03/1978 General: Well-developed, well-nourished, no acute distress HEENT: Sclerae are anicteric, conjunctiva  pink. Oral mucosa intact Lungs: Clear Heart: Irregularly irregular with controlled rate Abdomen: soft, obese, nontender, nondistended, no obvious ascites, no peritoneal signs, normal bowel sounds. No organomegaly. Multiple prior surgical incisions well-healed without obvious hernia Extremities: No edema Psychiatric: alert and oriented x3. Cooperative     ASSESSMENT:  #1. Intermittent solid food and pill dysphagia. Suspect subtle stricture that she has responded to dilation previously. Dysphagia is slightly atypical, by her description, however. #2. History of GERD status post remote open fundoplication #3. Negative colonoscopy August 2010 #4. Multiple medical problems including  chronic atrial fibrillation for which she is on Coumadin and diabetes mellitus for which she is on medications     PLAN:  #1. Continue omeprazole #2. Schedule upper endoscopy with esophageal dilation. The patient is high-risk given her comorbidities.The nature of the procedure, as well as the risks, benefits, and alternatives were carefully and thoroughly reviewed with the patient. Ample time for discussion and questions allowed. The patient understood, was satisfied, and agreed to proceed. #3. Cold Coumadin 5 days prior to the procedure. Confirm with her cardiologist that this is okay as it has been previously #4. Hold diabetic medications the day of the procedure #5. Repeat screening colonoscopy due around August 2020 #6. Resume general medical care with PCP

## 2013-10-26 NOTE — Patient Instructions (Signed)

## 2013-10-26 NOTE — Telephone Encounter (Signed)
RE: [name] DOB: 02-08-1951 MRN: [id number]   Dear Dr. Percival Spanish:   We have scheduled the above patient for an endoscopic procedure. Our records show that she is on anticoagulation therapy.   Please advise as to how long the patient may come off her therapy of Coumadin prior to the procedure, which is scheduled for 11/02/2013.  Please fax back/ or route the completed form to [phone] at 1:30pm.   Sincerely,    Phillis Haggis

## 2013-10-26 NOTE — Assessment & Plan Note (Signed)
Significantly improved. Patient given scaphoid pads today due to inability to afford cushioned arch support. Given information for obtaining more for other shoes. If she has return of symptoms she will return care. She would likely benefit from custom orthotics but we will defer this at this time.

## 2013-10-26 NOTE — Telephone Encounter (Signed)
Pt. Needs appt per Dr. Percival Spanish before we can address any warfarin issues

## 2013-10-27 ENCOUNTER — Telehealth: Payer: Self-pay

## 2013-10-27 ENCOUNTER — Ambulatory Visit (INDEPENDENT_AMBULATORY_CARE_PROVIDER_SITE_OTHER): Payer: Medicare Other | Admitting: General Surgery

## 2013-10-27 ENCOUNTER — Other Ambulatory Visit: Payer: Self-pay | Admitting: Internal Medicine

## 2013-10-27 ENCOUNTER — Encounter (INDEPENDENT_AMBULATORY_CARE_PROVIDER_SITE_OTHER): Payer: Self-pay | Admitting: General Surgery

## 2013-10-27 ENCOUNTER — Encounter (INDEPENDENT_AMBULATORY_CARE_PROVIDER_SITE_OTHER): Payer: Medicare Other | Admitting: General Surgery

## 2013-10-27 VITALS — BP 128/82 | HR 76 | Temp 98.0°F | Resp 18 | Ht 64.0 in | Wt 198.0 lb

## 2013-10-27 DIAGNOSIS — R109 Unspecified abdominal pain: Secondary | ICD-10-CM | POA: Diagnosis not present

## 2013-10-27 DIAGNOSIS — I6529 Occlusion and stenosis of unspecified carotid artery: Secondary | ICD-10-CM

## 2013-10-27 NOTE — Progress Notes (Signed)
Patient ID: [name], female   DOB: 1950-07-12, 63 y.o.   MRN: [id number]  History: This patient returns to see me for further discussion of her abdominal pain. She states that the pain is no worse. Its intermittant, Sometimes centralized, sometimes left lower quadrant. Nausea is rare. No change in bowel habits.  She has seen several other doctors this week, per primary care physician AYancey Flemings intravesically, her headache physician on [facility], and orthopedics because of chip fractures of her foot. She says she's going to have upper endoscopy on August 27 by Dr. Jenny Reichmann. 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  CT scan data and pelvis shows no acute findings, tiny bilateral inguinal hernias containing fat, diverticulosis. No inflammatory or obstructive process. May be a very tiny spigelian hernia unchanged from prior scans. Surgical absence of uterus. Lab work was not done.  Past history significant for atrial fibrillation on Coumadin. Laparoscopic cholecystectomy. Laparoscopic ventral hernia repair with mesh. Open hiatal hernia repair. Hypertension. Anxiety and depression. Diabetes. Diabetic neuropathy. Hypothyroidism. GERD.  She is in no distress today   Past history, family history, social history, and review of systems are documented in the chart, unchanged and noncontributory except as described above.  Exam: Constitutional: She is oriented to person, place, and time. She appears well-developed and well-nourished. No distress.  Weight 198 pounds. Height 5 feet 3 inches. :  Head: Normocephalic and atraumatic.  Nose: Nose normal.   Eyes: Conjunctivae and EOM are normal. Pupils are equal, round, and reactive to light. Left eye exhibits no discharge. No scleral icterus.  Neck: Neck supple. No JVD present. No tracheal deviation present. No thyromegaly present.  Cardiovascular: Normal rate, regular rhythm, normal heart sounds and intact distal pulses.  No murmur heard.  Pulmonary/Chest: Effort  normal and breath sounds normal. No respiratory distress. She has no wheezes. She has no rales. She exhibits no tenderness.  Abdominal: Soft. Bowel sounds are normal. She exhibits no distension and no mass. There is no tenderness. There is no rebound and no guarding.  Long midline scar well healed. Mild diffuse nonlocalizing tenderness without any peritoneal signs. No mass. No obvious hernia. Examined for hernia throughout the abdomen, edge of the mesh, and inguinal areas I do not feel any hernias. Musculoskeletal: She exhibits edema. She exhibits no tenderness.  Trace ankle edema. .  Neurological: She is alert and oriented to person, place, and time. She exhibits normal muscle tone. Coordination normal.  Skin: Skin is warm. No rash noted. She is not diaphoretic. No erythema. No pallor.  Psychiatric: She has a normal mood and affect. Her behavior is normal. Judgment and thought content normal.   Assessment  Abdominal pain of uncertain etiology. No mass or recurrent hernia on physical exam. CT scan unremarkable. Suspect pain is chronic due to multiple surgeries and scar tissue. There is no surgical problem identified. Obesity  Atrial fibrillation on Coumadin  Status post laparoscopic cholecystectomy  Status post midline laparotomy for hiatal hernia.  Status post laparoscopic ventral hernia repair with mesh, no recurrence on physical exam  Hypertension  Anxiety depression  Diabetes mellitus with neuropathy  Hypothyroidism  Fibromyalgia  Coronary artery disease   Plan  Discussed CT scan findings and physical findings with the patient. Advised against any surgical intervention. She is comfortable with this She will followup with her gastroenterologist if abdominal pain becomes a problem. It does seem to be interfering with her life at this time Return to see me if surgical problems arise.    Renelda Loma  Alyssa Grove, M.D., [facility] General and Minimally invasive  Surgery Breast and Colorectal Surgery Office:   [phone] Pager:   [phone]

## 2013-10-27 NOTE — Patient Instructions (Signed)
Repeat CT scan of your abdomen shows no acute problems. you had some tiny areas where the muscles bulged out and may be starting to form tiny hernias but I do not think they're causing your pain and I strongly recommend against any surgery for this.  There is no surgical problem identified as a cause of your abdominal pain, and I do not recommend any surgical intervention.  Continue followup with Dr. Scarlette Shorts, your gastroenterologist, and Dr. Aundra Dubin, your Primary care physician in the [facility] clinic.  Return to see Dr. Dalbert Batman if surgical problems arise.

## 2013-10-27 NOTE — Telephone Encounter (Signed)
[name] with patient to return call; patient  Needs to be told that Dr. Percival Spanish will not approve of her coming off Coumadin for her upcoming procedure until she is seen in their office.  (See below);  Procedure needs to be cancelled/rescheduled

## 2013-10-27 NOTE — Telephone Encounter (Signed)
Communicated Dr. Rosezella Florida message to patient. She is going to call and schedule an appointment with him and then call me back to reschedule her procedure.  I will cancel her currently scheduled procedure

## 2013-10-27 NOTE — Telephone Encounter (Signed)
Patient called back to let me know she had an appointment with Dr. Percival Spanish in early October.  We rescheduled her procedure for 12/22/2013 provided he approves her coming off Coumadin.  I told her I would mail her new instructions reflecting the changes in date and times.  Patient acknowledged and understood

## 2013-10-27 NOTE — Telephone Encounter (Signed)
Pt. Needs to be seen in office before any decisions are made about her coumadin/procedures

## 2013-11-02 ENCOUNTER — Encounter: Payer: Medicare Other | Admitting: Internal Medicine

## 2013-11-02 ENCOUNTER — Ambulatory Visit: Payer: Medicare Other | Admitting: Internal Medicine

## 2013-11-08 ENCOUNTER — Ambulatory Visit: Payer: Medicare Other | Admitting: Cardiology

## 2013-11-08 DIAGNOSIS — IMO0001 Reserved for inherently not codable concepts without codable children: Secondary | ICD-10-CM | POA: Diagnosis not present

## 2013-11-08 DIAGNOSIS — R51 Headache: Secondary | ICD-10-CM | POA: Diagnosis not present

## 2013-11-08 DIAGNOSIS — G518 Other disorders of facial nerve: Secondary | ICD-10-CM | POA: Diagnosis not present

## 2013-11-08 DIAGNOSIS — M542 Cervicalgia: Secondary | ICD-10-CM | POA: Diagnosis not present

## 2013-11-10 ENCOUNTER — Encounter: Payer: Self-pay | Admitting: Internal Medicine

## 2013-11-10 ENCOUNTER — Ambulatory Visit (INDEPENDENT_AMBULATORY_CARE_PROVIDER_SITE_OTHER): Payer: Medicare Other | Admitting: Cardiology

## 2013-11-10 VITALS — BP 90/62 | HR 91 | Ht 64.0 in | Wt 196.0 lb

## 2013-11-10 DIAGNOSIS — I10 Essential (primary) hypertension: Secondary | ICD-10-CM | POA: Diagnosis not present

## 2013-11-10 DIAGNOSIS — I4891 Unspecified atrial fibrillation: Secondary | ICD-10-CM | POA: Diagnosis not present

## 2013-11-10 DIAGNOSIS — I482 Chronic atrial fibrillation, unspecified: Secondary | ICD-10-CM

## 2013-11-10 DIAGNOSIS — I6529 Occlusion and stenosis of unspecified carotid artery: Secondary | ICD-10-CM

## 2013-11-10 NOTE — Progress Notes (Signed)
11/10/2013 [name]   February 07, 1951  [id number]  Primary Physician Cresenciano Genre, MD Primary Cardiologist: Dr. Percival Spanish  HPI:  The patient is a 63 year old female, followed Dr. Percival Spanish, who presents to clinic today for evaluation for preprocedural workup. She is scheduled to undergo an esophageal dilatation October 6 due to worsening dysphagia in the setting of known esophageal strictures. Her PMH is significant for atrial fibrillation on chronic warfarin therapy. INRs are followed in our office in [place]. She also has known carotid artery stenosis, GERD, hyperlipidemia, prior TIA x 2, HTN, hypothyroidism and type 2 diabetes mellitus. She underwent a heart catheterization in 2004 revealing normal coronary arteries, EF at that time was 60%. A Myoview nuclear stress test in 2011 was negative for ischemia. Her last 2-D echocardiogram was in 2009 revealing normal systolic function with EF of 55-65%, mild to moderate left atrial enlargement mild right atrial enlargement. Carotid Dopplers are followed yearly. This was last assessed June 2015, revealing stable heterogenous plaque with shadowing, 60-79% RICA stenosis (stable over serial exams) and 4-09% LICA stenosis, as well as patent vertebral arteries with antegrade flow. She has been completely asymptomatic, denying, lightheadedness, dizziness, syncope/near-syncope. She also denies any chest pain or dyspnea. She is able to walk a flight of stairs without exertional angina and without any limitations.  In regards to her upcoming scheduled esophageal dilatation, her gastroenterologist has asked that her Coumadin be held for 5 days.  Current Outpatient Prescriptions  Medication Sig Dispense Refill  . atenolol (TENORMIN) 25 MG tablet TAKE 1 AND 1/2 TABLET TWICE A DAY  135 tablet  1  . Capsaicin (CAPZASIN-HP) 0.1 % CREA Apply topically.      Marland Kitchen diltiazem (DILACOR XR) 180 MG 24 hr capsule Take 1 capsule (180 mg total) by mouth daily.  90 capsule  4  .  FLUoxetine (PROZAC) 20 MG tablet Take 1 tablet (20 mg total) by mouth daily.  90 tablet  4  . furosemide (LASIX) 20 MG tablet Take 1 tablet (20 mg total) by mouth daily.  90 tablet  4  . ibuprofen (ADVIL,MOTRIN) 800 MG tablet Take 800 mg by mouth every 8 (eight) hours as needed for moderate pain.       Marland Kitchen levothyroxine (SYNTHROID, LEVOTHROID) 75 MCG tablet Take 1 tablet (75 mcg total) by mouth every morning.  90 tablet  4  . losartan (COZAAR) 25 MG tablet Take 0.5 tablets (12.5 mg total) by mouth daily.  90 tablet  1  . metFORMIN (GLUCOPHAGE) 1000 MG tablet Take 1 tablet (1,000 mg total) by mouth 2 (two) times daily with a meal.  180 tablet  1  . omeprazole (PRILOSEC) 20 MG capsule Take 1 capsule (20 mg total) by mouth daily.  90 capsule  4  . potassium chloride (K-DUR) 10 MEQ tablet TAKE ONE (1) TABLET EACH DAY  30 tablet  2  . pravastatin (PRAVACHOL) 40 MG tablet Take 1 tablet (40 mg total) by mouth daily.  90 tablet  4  . tiZANidine (ZANAFLEX) 4 MG tablet Take 4 mg by mouth every 6 (six) hours as needed for muscle spasms.      Marland Kitchen warfarin (COUMADIN) 5 MG tablet Take 2.5-5 mg by mouth at bedtime. Take 5mg  on Sunday and Thursday. All other days take 2.5mg .       No current facility-administered medications for this visit.    Allergies  Allergen Reactions  . Gemfibrozil Swelling    REACTION: Angioedema  . Latex Other (See Comments)  Blisters where touched or applied  . Penicillins Hives    Will spread in patches all over the body.  . Adhesive [Tape] Other (See Comments)    Will blister skin where applied - do not use BAND-AIDS.  Marland Kitchen Codeine Hives    Will spread in patches all over the body.  . Ace Inhibitors     Cough   . Digoxin And Related     Makes BP drop    History   Social History  . Marital Status: Divorced    Spouse Name: N/A    Number of Children: 2  . Years of Education: 12th    Occupational History  . cosmotologist    Social History Main Topics  . Smoking  status: Never Smoker   . Smokeless tobacco: Never Used  . Alcohol Use: No  . Drug Use: No  . Sexual Activity: Not Currently   Other Topics Concern  . Not on file   Social History Narrative   Divorced, on disability, has 2 kids (son in [place], daughter in [place]), owns a house in [place], [place] and has a female roommate    No caffeine      Review of Systems: General: negative for chills, fever, night sweats or weight changes.  Cardiovascular: negative for chest pain, dyspnea on exertion, edema, orthopnea, palpitations, paroxysmal nocturnal dyspnea or shortness of breath Dermatological: negative for rash Respiratory: negative for cough or wheezing Urologic: negative for hematuria Abdominal: negative for nausea, vomiting, diarrhea, bright red blood per rectum, melena, or hematemesis Neurologic: negative for visual changes, syncope, or dizziness All other systems reviewed and are otherwise negative except as noted above.    Blood pressure 90/62, pulse 91, height 5\' 4"  (1.626 m), weight 196 lb (88.905 kg), last menstrual period 06/03/1978.  General appearance: alert, cooperative and no distress Neck: no carotid bruit and no JVD Lungs: clear to auscultation bilaterally Heart: regular rate and rhythm, S1, S2 normal, no murmur, click, rub or gallop Extremities: no LEE Pulses: 2+ and symmetric Skin: warm and dry Neurologic: Grossly normal  EKG Atrial fibrillation 91 bpm  ASSESSMENT AND PLAN:   1. Atrial fibrillation: Rate controlled. Continue diltiazem and atenolol for rate control. Continue with stroke prophylaxis with Coumadin.  2. Chronic oral anticoagulation: On chronic Coumadin therapy for atrial fibrillation and high CHADSVASC score. She has suffered 2 TIAs in the past. I feel that she would be too high risk to be off of Coumadin for 5 straight days. I've discussed this with our office pharmacist, and [employer]. We have recommended that she be bridged with Lovenox while Coumadin is  being held. Her next INR checked is in [place] on September 25. Mr. Rockney Ghee has contacted the pharmacist at our [facility] office and they have been notified to get her started on Lovenox when the time comes for her to hold her Coumadin.  3. Hypertension: Well controlled.  4. Bilateral carotid artery disease: Carotid duplex studies followed yearly. Last assessment was in June 2015 revealing Stable heterogenous plaque with shadowing, 60-79% RICA stenosis (stable over serial NLZJQ),7-34% LICA stenosis and patent vertebral arteries with antegrade flow. She remains asymptomatic. Continue yearly assessment.  PLAN  Continue plan as outlined above.  Continue routine followup with Dr. Percival Spanish as directed.   , [id number]-C 11/10/2013 11:00 AM

## 2013-11-10 NOTE — Patient Instructions (Addendum)
Your Physician recommends you to keep your upcoming appt in [facility] with Coumadin Clinic  Your physician recommends that you continue on your current medications as directed. Please refer to the Current Medication list given to you today.

## 2013-11-22 DIAGNOSIS — M542 Cervicalgia: Secondary | ICD-10-CM | POA: Diagnosis not present

## 2013-11-22 DIAGNOSIS — G518 Other disorders of facial nerve: Secondary | ICD-10-CM | POA: Diagnosis not present

## 2013-11-22 DIAGNOSIS — IMO0001 Reserved for inherently not codable concepts without codable children: Secondary | ICD-10-CM | POA: Diagnosis not present

## 2013-11-22 DIAGNOSIS — R51 Headache: Secondary | ICD-10-CM | POA: Diagnosis not present

## 2013-11-24 ENCOUNTER — Ambulatory Visit: Payer: Medicare Other | Admitting: Cardiology

## 2013-11-25 ENCOUNTER — Other Ambulatory Visit: Payer: Self-pay | Admitting: Internal Medicine

## 2013-11-27 DIAGNOSIS — G609 Hereditary and idiopathic neuropathy, unspecified: Secondary | ICD-10-CM | POA: Diagnosis not present

## 2013-11-29 ENCOUNTER — Emergency Department (HOSPITAL_COMMUNITY): Payer: Medicare Other

## 2013-11-29 ENCOUNTER — Encounter (HOSPITAL_COMMUNITY): Payer: Self-pay | Admitting: Emergency Medicine

## 2013-11-29 ENCOUNTER — Observation Stay (HOSPITAL_COMMUNITY)
Admission: EM | Admit: 2013-11-29 | Discharge: 2013-11-30 | Disposition: A | Discharge status: Home / Self Care | Payer: Medicare Other | Attending: Internal Medicine | Admitting: Internal Medicine

## 2013-11-29 DIAGNOSIS — K5730 Diverticulosis of large intestine without perforation or abscess without bleeding: Secondary | ICD-10-CM | POA: Insufficient documentation

## 2013-11-29 DIAGNOSIS — E1149 Type 2 diabetes mellitus with other diabetic neurological complication: Secondary | ICD-10-CM | POA: Diagnosis not present

## 2013-11-29 DIAGNOSIS — K648 Other hemorrhoids: Secondary | ICD-10-CM | POA: Diagnosis not present

## 2013-11-29 DIAGNOSIS — E119 Type 2 diabetes mellitus without complications: Secondary | ICD-10-CM | POA: Diagnosis present

## 2013-11-29 DIAGNOSIS — Z872 Personal history of diseases of the skin and subcutaneous tissue: Secondary | ICD-10-CM | POA: Insufficient documentation

## 2013-11-29 DIAGNOSIS — K2940 Chronic atrophic gastritis without bleeding: Secondary | ICD-10-CM | POA: Diagnosis not present

## 2013-11-29 DIAGNOSIS — I4891 Unspecified atrial fibrillation: Secondary | ICD-10-CM | POA: Diagnosis not present

## 2013-11-29 DIAGNOSIS — F329 Major depressive disorder, single episode, unspecified: Secondary | ICD-10-CM | POA: Diagnosis not present

## 2013-11-29 DIAGNOSIS — Z8673 Personal history of transient ischemic attack (TIA), and cerebral infarction without residual deficits: Secondary | ICD-10-CM | POA: Insufficient documentation

## 2013-11-29 DIAGNOSIS — IMO0001 Reserved for inherently not codable concepts without codable children: Secondary | ICD-10-CM | POA: Insufficient documentation

## 2013-11-29 DIAGNOSIS — I6529 Occlusion and stenosis of unspecified carotid artery: Secondary | ICD-10-CM | POA: Diagnosis not present

## 2013-11-29 DIAGNOSIS — K219 Gastro-esophageal reflux disease without esophagitis: Secondary | ICD-10-CM | POA: Insufficient documentation

## 2013-11-29 DIAGNOSIS — K819 Cholecystitis, unspecified: Secondary | ICD-10-CM | POA: Diagnosis not present

## 2013-11-29 DIAGNOSIS — E785 Hyperlipidemia, unspecified: Secondary | ICD-10-CM | POA: Insufficient documentation

## 2013-11-29 DIAGNOSIS — G4733 Obstructive sleep apnea (adult) (pediatric): Secondary | ICD-10-CM | POA: Insufficient documentation

## 2013-11-29 DIAGNOSIS — M1990 Unspecified osteoarthritis, unspecified site: Secondary | ICD-10-CM | POA: Insufficient documentation

## 2013-11-29 DIAGNOSIS — I10 Essential (primary) hypertension: Secondary | ICD-10-CM | POA: Diagnosis not present

## 2013-11-29 DIAGNOSIS — E039 Hypothyroidism, unspecified: Secondary | ICD-10-CM | POA: Diagnosis not present

## 2013-11-29 DIAGNOSIS — I499 Cardiac arrhythmia, unspecified: Secondary | ICD-10-CM | POA: Insufficient documentation

## 2013-11-29 DIAGNOSIS — F3289 Other specified depressive episodes: Secondary | ICD-10-CM | POA: Insufficient documentation

## 2013-11-29 DIAGNOSIS — M21619 Bunion of unspecified foot: Secondary | ICD-10-CM | POA: Insufficient documentation

## 2013-11-29 DIAGNOSIS — Z85828 Personal history of other malignant neoplasm of skin: Secondary | ICD-10-CM | POA: Diagnosis not present

## 2013-11-29 DIAGNOSIS — R519 Headache, unspecified: Secondary | ICD-10-CM | POA: Diagnosis present

## 2013-11-29 DIAGNOSIS — C44599 Other specified malignant neoplasm of skin of other part of trunk: Secondary | ICD-10-CM | POA: Diagnosis not present

## 2013-11-29 DIAGNOSIS — G5600 Carpal tunnel syndrome, unspecified upper limb: Secondary | ICD-10-CM | POA: Insufficient documentation

## 2013-11-29 DIAGNOSIS — K7689 Other specified diseases of liver: Secondary | ICD-10-CM | POA: Insufficient documentation

## 2013-11-29 DIAGNOSIS — I509 Heart failure, unspecified: Secondary | ICD-10-CM | POA: Insufficient documentation

## 2013-11-29 DIAGNOSIS — R51 Headache: Principal | ICD-10-CM

## 2013-11-29 DIAGNOSIS — R112 Nausea with vomiting, unspecified: Secondary | ICD-10-CM | POA: Diagnosis not present

## 2013-11-29 DIAGNOSIS — I160 Hypertensive urgency: Secondary | ICD-10-CM

## 2013-11-29 DIAGNOSIS — E1142 Type 2 diabetes mellitus with diabetic polyneuropathy: Secondary | ICD-10-CM | POA: Insufficient documentation

## 2013-11-29 DIAGNOSIS — G40309 Generalized idiopathic epilepsy and epileptic syndromes, not intractable, without status epilepticus: Secondary | ICD-10-CM | POA: Insufficient documentation

## 2013-11-29 DIAGNOSIS — C44509 Unspecified malignant neoplasm of skin of other part of trunk: Secondary | ICD-10-CM | POA: Diagnosis not present

## 2013-11-29 DIAGNOSIS — G3184 Mild cognitive impairment, so stated: Secondary | ICD-10-CM | POA: Diagnosis not present

## 2013-11-29 HISTORY — DX: Cardiac arrhythmia, unspecified: I49.9

## 2013-11-29 LAB — CBC
HCT: 39.8 % (ref 36.0–46.0)
Hemoglobin: 13.8 g/dL (ref 12.0–15.0)
MCH: 29.7 pg (ref 26.0–34.0)
MCHC: 34.7 g/dL (ref 30.0–36.0)
MCV: 85.8 fL (ref 78.0–100.0)
PLATELETS: 244 10*3/uL (ref 150–400)
RBC: 4.64 MIL/uL (ref 3.87–5.11)
RDW: 13.1 % (ref 11.5–15.5)
WBC: 8.2 10*3/uL (ref 4.0–10.5)

## 2013-11-29 LAB — COMPREHENSIVE METABOLIC PANEL
ALT: 28 U/L (ref 0–35)
AST: 31 U/L (ref 0–37)
Albumin: 3.9 g/dL (ref 3.5–5.2)
Alkaline Phosphatase: 110 U/L (ref 39–117)
Anion gap: 16 — ABNORMAL HIGH (ref 5–15)
BUN: 6 mg/dL (ref 6–23)
CO2: 23 mEq/L (ref 19–32)
Calcium: 9.3 mg/dL (ref 8.4–10.5)
Chloride: 103 mEq/L (ref 96–112)
Creatinine, Ser: 0.54 mg/dL (ref 0.50–1.10)
GFR calc Af Amer: >90 mL/min (ref >90)
GFR calc non Af Amer: >90 mL/min (ref >90)
Glucose, Bld: 171 mg/dL — ABNORMAL HIGH (ref 70–99)
POTASSIUM: 3.4 meq/L — AB (ref 3.7–5.3)
Sodium: 142 mEq/L (ref 137–147)
Total Bilirubin: 1.2 mg/dL (ref 0.3–1.2)
Total Protein: 7.1 g/dL (ref 6.0–8.3)

## 2013-11-29 LAB — GLUCOSE, CAPILLARY
Glucose-Capillary: 135 mg/dL — ABNORMAL HIGH (ref 70–99)
Glucose-Capillary: 129 mg/dL — ABNORMAL HIGH (ref 70–99)

## 2013-11-29 LAB — MRSA PCR SCREENING: MRSA by PCR: NEGATIVE

## 2013-11-29 LAB — PROTIME-INR
INR: 1.73 — ABNORMAL HIGH (ref 0.00–1.49)
Prothrombin Time: 20.3 seconds — ABNORMAL HIGH (ref 11.6–15.2)

## 2013-11-29 LAB — I-STAT TROPONIN, ED: Troponin i, poc: 0 ng/mL (ref 0.00–0.08)

## 2013-11-29 MED ORDER — INSULIN ASPART 100 UNIT/ML ~~LOC~~ SOLN
0.0000 [IU] | Freq: Three times a day (TID) | SUBCUTANEOUS | Status: DC
  Administered 2013-11-30: 2 [IU] via SUBCUTANEOUS

## 2013-11-29 MED ORDER — FUROSEMIDE 20 MG PO TABS
20.0000 mg | ORAL_TABLET | Freq: Every day | ORAL | Status: DC
  Administered 2013-11-29 – 2013-11-30 (×2): 20 mg via ORAL
  Filled 2013-11-29 (×2): qty 1

## 2013-11-29 MED ORDER — PRAVASTATIN SODIUM 40 MG PO TABS
40.0000 mg | ORAL_TABLET | Freq: Every day | ORAL | Status: DC
  Administered 2013-11-29: 40 mg via ORAL
  Filled 2013-11-29 (×2): qty 1

## 2013-11-29 MED ORDER — ACETAMINOPHEN 325 MG PO TABS
650.0000 mg | ORAL_TABLET | Freq: Once | ORAL | Status: AC
  Administered 2013-11-29: 650 mg via ORAL
  Filled 2013-11-29: qty 2

## 2013-11-29 MED ORDER — PROCHLORPERAZINE EDISYLATE 5 MG/ML IJ SOLN: 10.0000 mg | Freq: Once | INTRAMUSCULAR | Status: DC

## 2013-11-29 MED ORDER — DIPHENHYDRAMINE HCL 50 MG/ML IJ SOLN: 12.5000 mg | Freq: Once | INTRAMUSCULAR | Status: DC

## 2013-11-29 MED ORDER — LEVOTHYROXINE SODIUM 75 MCG PO TABS
75.0000 ug | ORAL_TABLET | Freq: Every day | ORAL | Status: DC
  Administered 2013-11-30: 75 ug via ORAL
  Filled 2013-11-29 (×2): qty 1

## 2013-11-29 MED ORDER — SIMVASTATIN 20 MG PO TABS: 20.0000 mg | ORAL_TABLET | Freq: Every day | ORAL | Status: DC

## 2013-11-29 MED ORDER — KETOROLAC TROMETHAMINE 30 MG/ML IJ SOLN
30.0000 mg | Freq: Once | INTRAMUSCULAR | Status: AC
  Administered 2013-11-30: 30 mg via INTRAVENOUS
  Filled 2013-11-29: qty 1

## 2013-11-29 MED ORDER — TIZANIDINE HCL 4 MG PO TABS
4.0000 mg | ORAL_TABLET | Freq: Four times a day (QID) | ORAL | Status: DC | PRN
  Filled 2013-11-29: qty 1

## 2013-11-29 MED ORDER — DILTIAZEM HCL ER COATED BEADS 180 MG PO CP24
180.0000 mg | ORAL_CAPSULE | Freq: Every day | ORAL | Status: DC
  Administered 2013-11-30: 180 mg via ORAL
  Filled 2013-11-29 (×2): qty 1

## 2013-11-29 MED ORDER — DIPHENHYDRAMINE HCL 50 MG/ML IJ SOLN
12.5000 mg | Freq: Once | INTRAMUSCULAR | Status: AC
  Administered 2013-11-30: 12.5 mg via INTRAVENOUS
  Filled 2013-11-29: qty 1

## 2013-11-29 MED ORDER — ATENOLOL 25 MG PO TABS
37.5000 mg | ORAL_TABLET | Freq: Two times a day (BID) | ORAL | Status: DC
  Administered 2013-11-29 – 2013-11-30 (×2): 37.5 mg via ORAL
  Filled 2013-11-29 (×3): qty 1

## 2013-11-29 MED ORDER — WARFARIN - PHARMACIST DOSING INPATIENT: Freq: Every day | Status: DC

## 2013-11-29 MED ORDER — LOSARTAN POTASSIUM 25 MG PO TABS
12.5000 mg | ORAL_TABLET | Freq: Every day | ORAL | Status: DC
  Administered 2013-11-29 – 2013-11-30 (×2): 12.5 mg via ORAL
  Filled 2013-11-29 (×2): qty 0.5

## 2013-11-29 MED ORDER — DILTIAZEM HCL 25 MG/5ML IV SOLN
20.0000 mg | Freq: Once | INTRAVENOUS | Status: AC
  Administered 2013-11-29: 20 mg via INTRAVENOUS
  Filled 2013-11-29: qty 5

## 2013-11-29 MED ORDER — KETOROLAC TROMETHAMINE 30 MG/ML IJ SOLN: 30.0000 mg | Freq: Once | INTRAMUSCULAR | Status: DC

## 2013-11-29 MED ORDER — PROMETHAZINE HCL 12.5 MG PO TABS: 12.5000 mg | ORAL_TABLET | Freq: Four times a day (QID) | ORAL | Status: DC | PRN

## 2013-11-29 MED ORDER — PANTOPRAZOLE SODIUM 40 MG PO TBEC
40.0000 mg | DELAYED_RELEASE_TABLET | Freq: Every day | ORAL | Status: DC
  Administered 2013-11-29 – 2013-11-30 (×2): 40 mg via ORAL
  Filled 2013-11-29 (×2): qty 1

## 2013-11-29 MED ORDER — POTASSIUM CHLORIDE ER 10 MEQ PO TBCR
40.0000 meq | EXTENDED_RELEASE_TABLET | Freq: Every day | ORAL | Status: DC
  Administered 2013-11-29 – 2013-11-30 (×2): 40 meq via ORAL
  Filled 2013-11-29 (×2): qty 4

## 2013-11-29 MED ORDER — PROCHLORPERAZINE EDISYLATE 5 MG/ML IJ SOLN
10.0000 mg | Freq: Once | INTRAMUSCULAR | Status: AC
  Administered 2013-11-30: 10 mg via INTRAVENOUS
  Filled 2013-11-29: qty 2

## 2013-11-29 MED ORDER — WARFARIN SODIUM 5 MG PO TABS
5.0000 mg | ORAL_TABLET | Freq: Once | ORAL | Status: DC
  Filled 2013-11-29: qty 1

## 2013-11-29 MED ORDER — FLUOXETINE HCL 20 MG PO TABS
20.0000 mg | ORAL_TABLET | Freq: Every day | ORAL | Status: DC
  Administered 2013-11-29 – 2013-11-30 (×2): 20 mg via ORAL
  Filled 2013-11-29 (×2): qty 1

## 2013-11-29 MED ORDER — ENOXAPARIN SODIUM 40 MG/0.4ML ~~LOC~~ SOLN
40.0000 mg | SUBCUTANEOUS | Status: DC
  Administered 2013-11-29: 40 mg via SUBCUTANEOUS
  Filled 2013-11-29 (×2): qty 0.4

## 2013-11-29 NOTE — H&P (Signed)
Date: 11/29/2013               Patient Name:  [name] MRN: [id number]  DOB: [birth year] Age / Sex: 63 y.o., female   PCP: Cresenciano Genre, MD         Medical Service: Internal Medicine Teaching Service         Attending Physician: Dr. Bartholomew Crews, MD    First Contact: Dr. Venita Lick Pager: [phone]  Second Contact: Dr. Clayburn Pert Pager: [phone]       After Hours (After 5p/  First Contact Pager: [phone]  weekends / holidays): Second Contact Pager: [phone]   Chief Complaint: headache, vomiting and chest palpitations that started overnight  History of Present Illness: [name] is a 63 yo woman presenting after the onset of the worst headache of her life that was accompanied by nausea and chest palpitations. She has a PMH of atrial fibrillation (on coumadin), CHF (last echo showed normal systolic function with EF 55-65% in 2009 and myoview stress test in 2011 negative for ischemia), asymptomatic carotid artery stenosis (followed yearly; last R ICA 60-79% occluded, L ICA 1-39% occluded), TIAx2, hypertension, hyperlipidemia, DMII, hypothyroidism, OSA (with CPAP), depression, fibromyalgia, depression, OA, chronic gastritis, fatty liver disease, chronic back pain, diverticulosis, and dysphagia (with strictures and scheduled dilation 12/2013), and sarcoidosis. She fell asleep on her couch last night and awoke at midnight with pain throughout her head. She says it was the worst headache she has ever experienced; it was soon accompanied by nausea and one episode of vomiting. She has had decreased PO intake over the past few weeks and a 5 lb weight loss; she attributes both to her worsening esophageal stricture and dysphagia. She denies shortness of breath, chest pain, dizziness, numbness or tingling, changes in vision, fever or chills.   Of note, she has a history of chronic headaches. They typically last 5-10 minutes and consist of sharp, shooting pain from her temples to the crown  of her head; occasionally the shooting pain occurs at the back of her head. They have been occurring for 2 months and she sought treatment at the headache center with Orie Rout, MD. He has not concluded on a diagnosis (considering temporal arteritis, headache due to intracranial vascular disease, headache secondary to metabolic syndrome, headache secondary to over-treatment, among others), but has been treating her with steroid injections and 2 mg tyzanidine. She was recently weaned off of the ibuprofen, gabapentin and amytriptylene. She has been taking all of her medications as prescribed.  In the ED, she was found to be in atrial fibrillation with RVR on arrival. Her BP and pulse were 181/100 and 115; they fell to 139/75 and 105 after diltiazem drip was given. The patient's headache has partially subsided to 7/10 in intensity and her head CT had no acute findings. She will be admitted to IMTS.  Meds: No current facility-administered medications for this encounter.   Current Outpatient Prescriptions  Medication Sig Dispense Refill  . atenolol (TENORMIN) 25 MG tablet Take 37.5 mg by mouth 2 (two) times daily.      Marland Kitchen diltiazem (CARDIZEM CD) 180 MG 24 hr capsule Take 180 mg by mouth daily.      Marland Kitchen FLUoxetine (PROZAC) 20 MG tablet Take 1 tablet (20 mg total) by mouth daily.  90 tablet  4  . furosemide (LASIX) 20 MG tablet Take 1 tablet (20 mg total) by mouth daily.  90 tablet  4  . levothyroxine (SYNTHROID, LEVOTHROID)  75 MCG tablet Take 1 tablet (75 mcg total) by mouth every morning.  90 tablet  4  . losartan (COZAAR) 25 MG tablet Take 0.5 tablets (12.5 mg total) by mouth daily.  90 tablet  1  . metFORMIN (GLUCOPHAGE) 1000 MG tablet Take 1 tablet (1,000 mg total) by mouth 2 (two) times daily with a meal.  180 tablet  1  . omeprazole (PRILOSEC) 20 MG capsule Take 1 capsule (20 mg total) by mouth daily.  90 capsule  4  . potassium chloride (K-DUR) 10 MEQ tablet Take 10 mEq by mouth daily.      .  pravastatin (PRAVACHOL) 40 MG tablet Take 1 tablet (40 mg total) by mouth daily.  90 tablet  4  . tiZANidine (ZANAFLEX) 4 MG tablet Take 4 mg by mouth every 6 (six) hours as needed for muscle spasms.      Marland Kitchen warfarin (COUMADIN) 5 MG tablet Take 2.5-5 mg by mouth at bedtime. Take 70m on Sunday and Thursday. All other days take 2.565m        Allergies: Allergies as of 11/29/2013 - Review Complete 11/29/2013  Allergen Reaction Noted  . Gemfibrozil Swelling   . Latex Other (See Comments)   . Penicillins Hives 03/11/2010  . Adhesive [tape] Other (See Comments) 05/26/2011  . Codeine Hives   . Ace inhibitors  10/03/2012  . Digoxin and related  10/05/2011   Past Medical History  Diagnosis Date  . Depression   . Obesity   . Skin neoplasm   . Bunion   . Carotid stenosis   . Fibromyalgia   . Internal hemorrhoid   . GERD (gastroesophageal reflux disease)   . Chronic gastritis   . Transaminase or LDH elevation   . Mild cognitive impairment   . Hyperlipidemia   . Fatty liver   . Lumbar back pain   . Diabetic peripheral neuropathy   . Dysphagia     no documented strictures but responded positively to dilation in past.   . Hypertension   . Cholecystitis     s/p cholecystectomy  . Sarcoidosis   . Fibromyalgia   . CHF (congestive heart failure)   . Skin cancer 1990's    "front of my right leg"  . Atrial fibrillation   . OSA (obstructive sleep apnea) 2003    "can't sleep w/that darm machine"  . Exertional dyspnea   . Hypothyroidism   . Type II diabetes mellitus   . TIA (transient ischemic attack)     07/29/11 pt denies this history  . H/O hiatal hernia   . Epileptic seizure, tonic     .No meds since age of 63 . Osteoarthritis   . Stroke     tia  . Hypothyroid   . Diverticulosis   . Digoxin toxicity     08/2011  . Bradycardia, drug induced     08/2011 (dig)  . Drug-induced hypotension     08/2011 (Dig)  . Carpal tunnel syndrome     mild    Past Surgical History    Procedure Laterality Date  . Hiatal hernia repair  1990    "had to have scar tissue removed 6 months after repair"  . Esophageal dilation    . Cataract extraction w/phaco  10/27/2010    Procedure: CATARACT EXTRACTION PHACO AND INTRAOCULAR LENS PLACEMENT (IOC);  Surgeon: CaWilliams Che Location: [facility];  Service: Ophthalmology;  Laterality: Left;  CDE- 1.78  . Cataract extraction w/phaco  07/13/2011  Procedure: CATARACT EXTRACTION PHACO AND INTRAOCULAR LENS PLACEMENT (IOC);  Surgeon: Williams Che, MD;  Location: [facility];  Service: Ophthalmology;  Laterality: Right;  CDE:  1.65  . Liposuction  1992  . Inverted nipples  1992  . Breast surgery  ~ 2010    excision milk duct; right breast  . Foot surgery  ~ 2011    ~straightened toe left foot & scraped bone below big toe"  . Foot surgery  ~ 2011    "scraped bone of big toe; shortened middle toe; right foot"  . Cholecystectomy  1980's  . Dilation and curettage of uterus  1970; 1976  . Abdominal hysterectomy  1980's    partial  . Tubal ligation  1976  . Bilateral oophorectomy  1980's    "after partial hysterectomy"  . Skin cancer excision  1990's    "front side of right shin"  . Ventral hernia repair  07/29/2011    Procedure: LAPAROSCOPIC VENTRAL HERNIA;  Surgeon: Adin Hector, MD;  Location: [facility];  Service: General;  Laterality: N/A;  multiple incarcerated hernias with mesh  . Other surgical history      mutiple ab surgeries for abdominal hernia  . Other surgical history      multiple dilation of esophagus  . Other surgical history      right lens implant 07/2011, left lens implant 10/2010  . Other surgical history      right breast bx=benign   Family History  Problem Relation Age of Onset  . Crohn's disease Mother   . Colitis Mother     Crohns  . Anesthesia problems Mother   . Arthritis Mother     rheumatoid; maternal side of family also with RA  . Cancer Father     skin  . Diabetes Father   . Heart disease Father    . Melanoma Father   . Coronary artery disease Father   . Diabetes Sister   . Depression Maternal Uncle   . Dementia Maternal Uncle   . Hypotension Neg Hx   . Malignant hyperthermia Neg Hx   . Pseudochol deficiency Neg Hx   . Colon cancer Paternal Uncle     ? if stomach or colon    History   Social History  . Marital Status: Divorced    Spouse Name: N/A    Number of Children: 2  . Years of Education: 12th    Occupational History  . cosmotologist    Social History Main Topics  . Smoking status: Never Smoker   . Smokeless tobacco: Never Used  . Alcohol Use: No  . Drug Use: No  . Sexual Activity: Not Currently   Other Topics Concern  . Not on file   Social History Narrative   Divorced, on disability, has 2 kids (son in [place], daughter in [place]), owns a house in [place], [place] and has a female roommate    No caffeine     Review of Systems: Pertinent items are noted in HPI.  Physical Exam: Blood pressure 130/80, pulse 104, temperature 98.5 F (36.9 C), temperature source Oral, resp. rate 19, last menstrual period 06/03/1978, SpO2 98.00%. Appearance: diaphoretic, in no acute distress HEENT: AT/[place], EOMi, PERRL, no temporal tenderness, no lymphadenopathy Heart: irregular rhythm, tachycardic, no murmurs Lungs: CTAB, no wheezes Abdomen: BS+, soft, nontender, no rebound, no guarding Musculoskeletal: no joint swelling Extremities: no LE edema Neurologic: A&Ox3, CN II-XII intact, FNF intact, 2-point discrimination intact, sensation and strength full throughout, did not observe  gait Skin: multiple nevi on back  Lab results: Basic Metabolic Panel:  Recent Labs  11/29/13 1148  NA 142  K 3.4*  CL 103  CO2 23  GLUCOSE 171*  BUN 6  CREATININE 0.54  CALCIUM 9.3   Liver Function Tests:  Recent Labs  11/29/13 1148  AST 31  ALT 28  ALKPHOS 110  BILITOT 1.2  PROT 7.1  ALBUMIN 3.9   CBC:  Recent Labs  11/29/13 1148  WBC 8.2  HGB 13.8  HCT 39.8  MCV 85.8  PLT  244   INR 1.73  Imaging results:  Dg Chest 2 View  11/29/2013   CLINICAL DATA:  Headache, vomiting, history diabetes, hypertension, sarcoidosis, CHF, skin cancer  EXAM: CHEST  2 VIEW  COMPARISON:  09/04/2013  FINDINGS: Enlargement of cardiac silhouette.  Atherosclerotic calcification aorta.  Mediastinal contours and pulmonary vascularity normal.  Lungs clear.  No pleural effusion or pneumothorax.  Bones demineralized.  IMPRESSION: Enlargement of cardiac silhouette.  No acute abnormalities.   Electronically Signed   By: Lavonia Dana M.D.   On: 11/29/2013 12:50   Ct Head Wo Contrast  11/29/2013   CLINICAL DATA:  Severe headache at back of head down neck, vomiting  EXAM: CT HEAD WITHOUT CONTRAST  TECHNIQUE: Contiguous axial images were obtained from the base of the skull through the vertex without intravenous contrast.  COMPARISON:  08/18/2013  FINDINGS: Normal ventricular morphology.  No midline shift or mass effect.  Normal appearance of brain parenchyma.  No intracranial hemorrhage, mass lesion, or acute infarction.  Visualized paranasal sinuses and mastoid air cells clear.  Bones unremarkable.  Atherosclerotic calcifications at carotid siphons.  IMPRESSION: No acute intracranial abnormalities.   Electronically Signed   By: Lavonia Dana M.D.   On: 11/29/2013 13:02    Other results: EKG: Ventricular Rate: 117  PR Interval:  QRS Duration: 70  QT Interval: 310  QTC Calculation: 432  R Axis: 16  Text Interpretation: Atrial fibrillation with rapid ventricular response  Nonspecific ST and T wave abnormality Abnormal ECG No significant change  since last tracing Although rate has increased   Assessment & Plan by Problem: Principal Problem:   Atrial fibrillation Active Problems:   DIABETES MELLITUS, TYPE II   Unspecified essential hypertension   Headache(784.0) [name] is a 63 yo woman with a complicated medical history including atrial fibrillation and chronic headaches who came to the  hospital with a 10/10 headache and was found to be in atrial fibrillation with RVR and hypertensive emergency with BP to 181/100. She is already treated with coumadin (INR pending) and attained decreased pressures rate control with the initiation of diltiazem in the ED. Her head CT was negative for acute abnormalities.   Chronic Headache: Unclear headache diagnosis; she has recently started seeing the headache clinic where differential diagnosis includes temporal arteritis, headache due to intracranial vascular disease, headache secondary to metabolic syndrome, headache secondary to over-treatment, among others. Has had ophthalmologic evaluation for temporal arteritis and did not have temporal tenderness today. Head CT was negative. Headache has been better controlled on tylenol in the ED.  - f/u ESR - tizanidine as per o/p headache regimen - headache cocktail: compazine, toradol, benadryl  Atrial Fibrillation: Chronic afib with symptomatic episode early this morning. Found to be in afib with RVR on arrival. Now rate controlled on home diltiazem. Also on coumadin at home; INR currently 1.73.  - continue diltiazem - f/u INR; coumadin dosing per pharmacy - tele  -  home atenolol  DMII: (last A1c 8.2%), FS in 130s - holding home metformin; SSI sensitive with CBGs  Hypertension: - continue home lasix, losartan, atenolol  Hypokalemia: - continue home [place] - trend BMET  Diet: - heart healthy  DVT Ppx: - on coumadin, but subtherapeutic. Started on lovenox and will d/c once INR therapeutic.  Dispo: Disposition is deferred at this time, awaiting improvement of current medical problems. Anticipated discharge in approximately 1 day(s).   The patient does have a current PCP Cresenciano Genre, MD) and does need an [facility] hospital follow-up appointment after discharge.  The patient does not have transportation limitations that hinder transportation to clinic appointments.  Signed: Drucilla Schmidt,  MD 11/29/2013, 3:18 PM

## 2013-11-29 NOTE — ED Notes (Addendum)
Pt reports that she started feeling bad last night, having headache, pain to shoulders, neck, abd and now vomiting. Diaphoretic at triage.

## 2013-11-29 NOTE — ED Provider Notes (Signed)
CSN: [id number]     Arrival date & time 11/29/13  1036 History   First MD Initiated Contact with Patient 11/29/13 1131     Chief Complaint  Patient presents with  . Headache  . Emesis     (Consider location/radiation/quality/duration/timing/severity/associated sxs/prior Treatment) HPI Comments: Pt is a 63 y/o female with an extensive PMHx including hypertension, CHF, atrial fibrillation, carotid stenosis, fibromyalgia, chronic headaches, TIA, diabetes and GERD, multiple other medical problems who presents to the emergency department complaining of sudden onset headache waking her up from sleep around 12:00 AM today. Headache described as a "vice" wrapped around her head, worse in her left frontotemporal region, constant and severe, radiating down her neck to her shoulders. No aggravating or alleviating factors. Denies neck stiffness, fever or chills. She is followed by the headache clinic, however states she has never had a headache like this in the past. Admits to nausea with one episode of vomiting on arrival to the emergency department. Denies photophobia or phonophobia. States her upper abdomen is sore from the episode of vomiting. Denies chest pain or shortness of breath. States she feels very weak. Denies visual disturbance, confusion, extremity weakness. States her left hand feel slightly numb.  Patient is a 63 y.o. female presenting with headaches and vomiting. The history is provided by the patient.  Headache Associated symptoms: abdominal pain, nausea, neck pain and vomiting   Associated symptoms: no neck stiffness   Emesis Associated symptoms: abdominal pain and headaches     Past Medical History  Diagnosis Date  . Depression   . Obesity   . Skin neoplasm   . Bunion   . Carotid stenosis   . Fibromyalgia   . Internal hemorrhoid   . GERD (gastroesophageal reflux disease)   . Chronic gastritis   . Transaminase or LDH elevation   . Mild cognitive impairment   . Hyperlipidemia    . Fatty liver   . Lumbar back pain   . Diabetic peripheral neuropathy   . Dysphagia     no documented strictures but responded positively to dilation in past.   . Hypertension   . Cholecystitis     s/p cholecystectomy  . Sarcoidosis   . Fibromyalgia   . CHF (congestive heart failure)   . Skin cancer 1990's    "front of my right leg"  . Atrial fibrillation   . OSA (obstructive sleep apnea) 2003    "can't sleep w/that darm machine"  . Exertional dyspnea   . Hypothyroidism   . Type II diabetes mellitus   . TIA (transient ischemic attack)     07/29/11 pt denies this history  . H/O hiatal hernia   . Epileptic seizure, tonic     .No meds since age of 65.  . Osteoarthritis   . Stroke     tia  . Hypothyroid   . Diverticulosis   . Digoxin toxicity     08/2011  . Bradycardia, drug induced     08/2011 (dig)  . Drug-induced hypotension     08/2011 (Dig)  . Carpal tunnel syndrome     mild    Past Surgical History  Procedure Laterality Date  . Hiatal hernia repair  1990    "had to have scar tissue removed 6 months after repair"  . Esophageal dilation    . Cataract extraction w/phaco  10/27/2010    Procedure: CATARACT EXTRACTION PHACO AND INTRAOCULAR LENS PLACEMENT (IOC);  Surgeon: Williams Che;  Location: [facility];  Service: Ophthalmology;  Laterality: Left;  CDE- 1.78  . Cataract extraction w/phaco  07/13/2011    Procedure: CATARACT EXTRACTION PHACO AND INTRAOCULAR LENS PLACEMENT (IOC);  Surgeon: Williams Che, MD;  Location: [facility];  Service: Ophthalmology;  Laterality: Right;  CDE:  1.65  . Liposuction  1992  . Inverted nipples  1992  . Breast surgery  ~ 2010    excision milk duct; right breast  . Foot surgery  ~ 2011    ~straightened toe left foot & scraped bone below big toe"  . Foot surgery  ~ 2011    "scraped bone of big toe; shortened middle toe; right foot"  . Cholecystectomy  1980's  . Dilation and curettage of uterus  1970; 1976  . Abdominal hysterectomy  1980's     partial  . Tubal ligation  1976  . Bilateral oophorectomy  1980's    "after partial hysterectomy"  . Skin cancer excision  1990's    "front side of right shin"  . Ventral hernia repair  07/29/2011    Procedure: LAPAROSCOPIC VENTRAL HERNIA;  Surgeon: Adin Hector, MD;  Location: [facility];  Service: General;  Laterality: N/A;  multiple incarcerated hernias with mesh  . Other surgical history      mutiple ab surgeries for abdominal hernia  . Other surgical history      multiple dilation of esophagus  . Other surgical history      right lens implant 07/2011, left lens implant 10/2010  . Other surgical history      right breast bx=benign   Family History  Problem Relation Age of Onset  . Crohn's disease Mother   . Colitis Mother     Crohns  . Anesthesia problems Mother   . Arthritis Mother     rheumatoid; maternal side of family also with RA  . Cancer Father     skin  . Diabetes Father   . Heart disease Father   . Melanoma Father   . Coronary artery disease Father   . Diabetes Sister   . Depression Maternal Uncle   . Dementia Maternal Uncle   . Hypotension Neg Hx   . Malignant hyperthermia Neg Hx   . Pseudochol deficiency Neg Hx   . Colon cancer Paternal Uncle     ? if stomach or colon    History  Substance Use Topics  . Smoking status: Never Smoker   . Smokeless tobacco: Never Used  . Alcohol Use: No   OB History   Grav Para Term Preterm Abortions TAB SAB Ect Mult Living                 Review of Systems  Constitutional: Positive for diaphoresis.  Gastrointestinal: Positive for nausea, vomiting and abdominal pain.  Musculoskeletal: Positive for neck pain. Negative for neck stiffness.  Neurological: Positive for weakness and headaches.  All other systems reviewed and are negative.     Allergies  Gemfibrozil; Latex; Penicillins; Adhesive; Codeine; Ace inhibitors; and Digoxin and related  Home Medications   Prior to Admission medications   Medication Sig  Start Date End Date Taking? Authorizing Provider  atenolol (TENORMIN) 25 MG tablet Take 37.5 mg by mouth 2 (two) times daily.   Yes Historical Provider, MD  diltiazem (CARDIZEM CD) 180 MG 24 hr capsule Take 180 mg by mouth daily.   Yes Historical Provider, MD  FLUoxetine (PROZAC) 20 MG tablet Take 1 tablet (20 mg total) by mouth daily. 09/07/13  Yes Cresenciano Genre, MD  furosemide (LASIX) 20 MG tablet Take 1 tablet (20 mg total) by mouth daily.   Yes Cresenciano Genre, MD  levothyroxine (SYNTHROID, LEVOTHROID) 75 MCG tablet Take 1 tablet (75 mcg total) by mouth every morning.   Yes Cresenciano Genre, MD  losartan (COZAAR) 25 MG tablet Take 0.5 tablets (12.5 mg total) by mouth daily. 05/01/13  Yes Cresenciano Genre, MD  metFORMIN (GLUCOPHAGE) 1000 MG tablet Take 1 tablet (1,000 mg total) by mouth 2 (two) times daily with a meal. 09/07/13  Yes Cresenciano Genre, MD  omeprazole (PRILOSEC) 20 MG capsule Take 1 capsule (20 mg total) by mouth daily. 05/01/13  Yes Cresenciano Genre, MD  potassium chloride (K-DUR) 10 MEQ tablet Take 10 mEq by mouth daily.   Yes Historical Provider, MD  pravastatin (PRAVACHOL) 40 MG tablet Take 1 tablet (40 mg total) by mouth daily. 05/01/13  Yes Cresenciano Genre, MD  tiZANidine (ZANAFLEX) 4 MG tablet Take 4 mg by mouth every 6 (six) hours as needed for muscle spasms.   Yes Historical Provider, MD  warfarin (COUMADIN) 5 MG tablet Take 2.5-5 mg by mouth at bedtime. Take 5mg  on Sunday and Thursday. All other days take 2.5mg .   Yes Cresenciano Genre, MD   BP 181/100  Pulse 115  Temp(Src) 97.6 F (36.4 C) (Oral)  Resp 24  SpO2 98%  LMP 06/03/1978 Physical Exam  Nursing note and vitals reviewed. Constitutional: She is oriented to person, place, and time. She appears well-developed and well-nourished. No distress.  Uncomfortable but in no apparent distress.  HENT:  Head: Normocephalic and atraumatic.  Mouth/Throat: Oropharynx is clear and moist.  Eyes: Conjunctivae and EOM are normal. Pupils are  equal, round, and reactive to light.  Neck: Normal range of motion. Neck supple. No JVD present.  Cardiovascular: Normal heart sounds and intact distal pulses.  An irregularly irregular rhythm present. Tachycardia present.   No extremity edema.  Pulmonary/Chest: Effort normal and breath sounds normal. No respiratory distress.  Abdominal: Soft. Bowel sounds are normal. There is no tenderness.  Musculoskeletal: Normal range of motion. She exhibits no edema.  Neurological: She is alert and oriented to person, place, and time. She has normal strength. No sensory deficit.  Speech fluent, goal oriented. Moves limbs without ataxia. Equal grip strength bilateral.  Skin: Skin is warm. She is diaphoretic.  Psychiatric: She has a normal mood and affect. Her behavior is normal.    ED Course  Procedures (including critical care time) Labs Review Labs Reviewed  COMPREHENSIVE METABOLIC PANEL - Abnormal; Notable for the following:    Potassium 3.4 (*)    Glucose, Bld 171 (*)    Anion gap 16 (*)    All other components within normal limits  CBC  PROTIME-INR  I-STAT TROPOININ, ED    Imaging Review Dg Chest 2 View  11/29/2013   CLINICAL DATA:  Headache, vomiting, history diabetes, hypertension, sarcoidosis, CHF, skin cancer  EXAM: CHEST  2 VIEW  COMPARISON:  09/04/2013  FINDINGS: Enlargement of cardiac silhouette.  Atherosclerotic calcification aorta.  Mediastinal contours and pulmonary vascularity normal.  Lungs clear.  No pleural effusion or pneumothorax.  Bones demineralized.  IMPRESSION: Enlargement of cardiac silhouette.  No acute abnormalities.   Electronically Signed   By: Lavonia Dana M.D.   On: 11/29/2013 12:50   Ct Head Wo Contrast  11/29/2013   CLINICAL DATA:  Severe headache at back of head down neck, vomiting  EXAM: CT HEAD WITHOUT CONTRAST  TECHNIQUE: Contiguous  axial images were obtained from the base of the skull through the vertex without intravenous contrast.  COMPARISON:  08/18/2013   FINDINGS: Normal ventricular morphology.  No midline shift or mass effect.  Normal appearance of brain parenchyma.  No intracranial hemorrhage, mass lesion, or acute infarction.  Visualized paranasal sinuses and mastoid air cells clear.  Bones unremarkable.  Atherosclerotic calcifications at carotid siphons.  IMPRESSION: No acute intracranial abnormalities.   Electronically Signed   By: Lavonia Dana M.D.   On: 11/29/2013 13:02     EKG Interpretation   Date/Time:  Wednesday November 29 2013 11:27:23 EDT Ventricular Rate:  117 PR Interval:    QRS Duration: 70 QT Interval:  310 QTC Calculation: 432 R Axis:   16 Text Interpretation:  Atrial fibrillation with rapid ventricular response  Nonspecific ST and T wave abnormality Abnormal ECG No significant change  since last tracing Although rate has increased Confirmed by BEATON  MD,  ROBERT ([id number]) on 11/29/2013 11:34:29 AM      MDM   Final diagnoses:  Nonintractable headache  Hypertensive urgency  Atrial fibrillation, rapid    Patient presenting with headache with associated vomiting. History of headaches, however this is more severe and different. She is uncomfortable appearing but in no apparent distress. Afebrile, tachycardic. EKG showing atrial fibrillation with RVR. Blood pressure 181/100. IV Cardizem given. Heart rate and blood pressure significantly improved, 139/85. After blood pressure controlled, patient's headache has subsided. Labs, chest x-ray and head CT without any acute findings. Patient will be admitted for further workup. Admission accepted by internal medicine teaching service.  Illene Labrador, PA-C 11/29/13 1500

## 2013-11-29 NOTE — Progress Notes (Signed)
ANTICOAGULATION CONSULT NOTE - Initial Consult  Pharmacy Consult for coumadin Indication: atrial fibrillation  Allergies  Allergen Reactions  . Gemfibrozil Swelling    REACTION: Angioedema  . Latex Other (See Comments)    Blisters where touched or applied  . Penicillins Hives    Will spread in patches all over the body.  . Adhesive [Tape] Other (See Comments)    Will blister skin where applied - do not use BAND-AIDS.  Marland Kitchen Codeine Hives    Will spread in patches all over the body.  . Ace Inhibitors     Cough   . Digoxin And Related     Makes BP drop    Patient Measurements: Height: 5\' 4"  (162.6 cm) Weight: 191 lb 14.4 oz (87.045 kg) IBW/kg (Calculated) : 54.7   Vital Signs: Temp: 98.2 F (36.8 C) (09/23 1548) Temp src: Oral (09/23 1548) BP: 139/75 mmHg (09/23 1548) Pulse Rate: 105 (09/23 1548)  Labs:  Recent Labs  11/29/13 1148  HGB 13.8  HCT 39.8  PLT 244  CREATININE 0.54    Estimated Creatinine Clearance: 74.8 ml/min (by C-G formula based on Cr of 0.54).   Medical History: Past Medical History  Diagnosis Date  . Depression   . Obesity   . Skin neoplasm   . Bunion   . Carotid stenosis   . Fibromyalgia   . Internal hemorrhoid   . GERD (gastroesophageal reflux disease)   . Chronic gastritis   . Transaminase or LDH elevation   . Mild cognitive impairment   . Hyperlipidemia   . Fatty liver   . Lumbar back pain   . Diabetic peripheral neuropathy   . Dysphagia     no documented strictures but responded positively to dilation in past.   . Hypertension   . Cholecystitis     s/p cholecystectomy  . Sarcoidosis   . Fibromyalgia   . CHF (congestive heart failure)   . Skin cancer 1990's    "front of my right leg"  . Atrial fibrillation   . OSA (obstructive sleep apnea) 2003    "can't sleep w/that darm machine"  . Exertional dyspnea   . Hypothyroidism   . Type II diabetes mellitus   . TIA (transient ischemic attack)     07/29/11 pt denies this  history  . H/O hiatal hernia   . Epileptic seizure, tonic     .No meds since age of 71.  . Osteoarthritis   . Stroke     tia  . Hypothyroid   . Diverticulosis   . Digoxin toxicity     08/2011  . Bradycardia, drug induced     08/2011 (dig)  . Drug-induced hypotension     08/2011 (Dig)  . Carpal tunnel syndrome     mild     Medications:  Warfarin 2.5mg  daily with 5mg  on Sunday/Thursday  Assessment: 63 year old female with history of afib on chronic warfarin. INR today (1.73) not showing in computer but verified with lab. No bleeding issues on admission.  Goal of Therapy:  INR 2-3 Monitor platelets by anticoagulation protocol: Yes   Plan:  Warfarin 5mg  tonight Daily INR  Erin Hearing PharmD., BCPS Clinical Pharmacist Pager [phone] 11/29/2013 6:12 PM

## 2013-11-29 NOTE — ED Notes (Signed)
MD at bedside. 

## 2013-11-29 NOTE — ED Notes (Signed)
Robyn, PA at the bedside.

## 2013-11-29 NOTE — ED Provider Notes (Signed)
Medical screening examination/treatment/procedure(s) were performed by non-physician practitioner and as supervising physician I was immediately available for consultation/collaboration.   Dot Lanes, MD 11/29/13 2038

## 2013-11-29 NOTE — ED Notes (Signed)
Attempted report 

## 2013-11-30 ENCOUNTER — Encounter (HOSPITAL_COMMUNITY): Payer: Self-pay | Admitting: General Practice

## 2013-11-30 DIAGNOSIS — I4891 Unspecified atrial fibrillation: Secondary | ICD-10-CM

## 2013-11-30 DIAGNOSIS — R51 Headache: Principal | ICD-10-CM

## 2013-11-30 DIAGNOSIS — E876 Hypokalemia: Secondary | ICD-10-CM | POA: Diagnosis not present

## 2013-11-30 DIAGNOSIS — G8929 Other chronic pain: Secondary | ICD-10-CM | POA: Diagnosis not present

## 2013-11-30 LAB — BASIC METABOLIC PANEL
Anion gap: 11 (ref 5–15)
BUN: 7 mg/dL (ref 6–23)
CALCIUM: 9 mg/dL (ref 8.4–10.5)
CO2: 27 mEq/L (ref 19–32)
Chloride: 105 mEq/L (ref 96–112)
Creatinine, Ser: 0.74 mg/dL (ref 0.50–1.10)
GFR calc Af Amer: >90 mL/min (ref >90)
GFR calc non Af Amer: 87 mL/min — ABNORMAL LOW (ref >90)
Glucose, Bld: 145 mg/dL — ABNORMAL HIGH (ref 70–99)
Potassium: 4 mEq/L (ref 3.7–5.3)
Sodium: 143 mEq/L (ref 137–147)

## 2013-11-30 LAB — GLUCOSE, CAPILLARY
Glucose-Capillary: 152 mg/dL — ABNORMAL HIGH (ref 70–99)
Glucose-Capillary: 118 mg/dL — ABNORMAL HIGH (ref 70–99)

## 2013-11-30 LAB — SEDIMENTATION RATE: Sed Rate: 11 mm/hr (ref 0–22)

## 2013-11-30 LAB — PROTIME-INR
INR: 1.34 (ref 0.00–1.49)
Prothrombin Time: 16.6 seconds — ABNORMAL HIGH (ref 11.6–15.2)

## 2013-11-30 MED ORDER — INFLUENZA VAC SPLIT QUAD 0.5 ML IM SUSY
0.5000 mL | PREFILLED_SYRINGE | INTRAMUSCULAR | Status: AC
  Administered 2013-11-30: 0.5 mL via INTRAMUSCULAR
  Filled 2013-11-30 (×2): qty 0.5

## 2013-11-30 NOTE — Progress Notes (Signed)
Subjective: [name] feels great today. She no longer has a headache. She describes that her headache was very bad until several hours after the blood pressure medication (diltiazem) was started in the ED. Since then, it has slowly subsided to disappearance. She is eager to go home. She has a follow-up appointment with her headache specialist on Wednesday.  Objective: Vital signs in last 24 hours: Filed Vitals:   11/29/13 1429 11/29/13 1514 11/29/13 1548 11/30/13 0544  BP: 139/85 130/80 139/75 153/81  Pulse: 103 104 105 97  Temp: 98.7 F (37.1 C) 98.5 F (36.9 C) 98.2 F (36.8 C) 97.9 F (36.6 C)  TempSrc: Oral Oral Oral Oral  Resp: 22 19 16 16   Height:   5' 4"  (1.626 m)   Weight:   191 lb 14.4 oz (87.045 kg) 191 lb 11.2 oz (86.955 kg)  SpO2: 94% 98% 100% 98%   Weight change:   Intake/Output Summary (Last 24 hours) at 11/30/13 1415 Last data filed at 11/30/13 0900  Gross per 24 hour  Intake    360 ml  Output      0 ml  Net    360 ml   Physical Exam: Appearance: comfortable in bed, in no acute distress  HEENT: AT/[place], EOMi, PERRL, no temporal tenderness, no lymphadenopathy  Heart: irregular rhythm again today, no longer tachycardic, no murmurs  Lungs: CTAB, no wheezes  Abdomen: BS+, soft, nontender, no rebound, no guarding  Musculoskeletal: no joint swelling  Extremities: no LE edema  Neurologic: A&Ox3, CN II-XII intact, FNF intact, 2-point discrimination intact, sensation and strength full throughout, did not observe gait  Skin: multiple nevi on back  Lab Results: Basic Metabolic Panel:  Recent Labs Lab 11/29/13 1148 11/30/13 0634  NA 142 143  K 3.4* 4.0  CL 103 105  CO2 23 27  GLUCOSE 171* 145*  BUN 6 7  CREATININE 0.54 0.74  CALCIUM 9.3 9.0   Liver Function Tests:  Recent Labs Lab 11/29/13 1148  AST 31  ALT 28  ALKPHOS 110  BILITOT 1.2  PROT 7.1  ALBUMIN 3.9  CBC:  Recent Labs Lab 11/29/13 1148  WBC 8.2  HGB 13.8  HCT 39.8  MCV 85.8    PLT 244  CBG:  Recent Labs Lab 11/29/13 1651 11/29/13 2134 11/30/13 0755 11/30/13 1142  GLUCAP 135* 129* 152* 118*   Coagulation:  Recent Labs Lab 11/29/13 1300 11/30/13 0634  LABPROT 20.3* 16.6*  INR 1.73* 1.34   Studies/Results: Dg Chest 2 View  11/29/2013   CLINICAL DATA:  Headache, vomiting, history diabetes, hypertension, sarcoidosis, CHF, skin cancer  EXAM: CHEST  2 VIEW  COMPARISON:  09/04/2013  FINDINGS: Enlargement of cardiac silhouette.  Atherosclerotic calcification aorta.  Mediastinal contours and pulmonary vascularity normal.  Lungs clear.  No pleural effusion or pneumothorax.  Bones demineralized.  IMPRESSION: Enlargement of cardiac silhouette.  No acute abnormalities.   Electronically Signed   By: Lavonia Dana M.D.   On: 11/29/2013 12:50   Ct Head Wo Contrast  11/29/2013   CLINICAL DATA:  Severe headache at back of head down neck, vomiting  EXAM: CT HEAD WITHOUT CONTRAST  TECHNIQUE: Contiguous axial images were obtained from the base of the skull through the vertex without intravenous contrast.  COMPARISON:  08/18/2013  FINDINGS: Normal ventricular morphology.  No midline shift or mass effect.  Normal appearance of brain parenchyma.  No intracranial hemorrhage, mass lesion, or acute infarction.  Visualized paranasal sinuses and mastoid air cells clear.  Bones  unremarkable.  Atherosclerotic calcifications at carotid siphons.  IMPRESSION: No acute intracranial abnormalities.   Electronically Signed   By: Lavonia Dana M.D.   On: 11/29/2013 13:02   Medications: I have reviewed the patient's current medications. Scheduled Meds: . atenolol  37.5 mg Oral BID  . diltiazem  180 mg Oral Daily  . enoxaparin (LOVENOX) injection  40 mg Subcutaneous Q24H  . FLUoxetine  20 mg Oral Daily  . furosemide  20 mg Oral Daily  . insulin aspart  0-9 Units Subcutaneous TID WC  . levothyroxine  75 mcg Oral QAC breakfast  . losartan  12.5 mg Oral Daily  . pantoprazole  40 mg Oral Daily  .  potassium chloride  40 mEq Oral Daily  . pravastatin  40 mg Oral q1800  . warfarin  5 mg Oral ONCE-1800  . Warfarin - Pharmacist Dosing Inpatient   Does not apply q1800   Continuous Infusions:  PRN Meds:.promethazine, tiZANidine Assessment/Plan: Principal Problem:   Atrial fibrillation Active Problems:   DIABETES MELLITUS, TYPE II   Unspecified essential hypertension   Headache(784.0) [name] is a 63 yo woman with a complicated medical history including atrial fibrillation and chronic headaches who came to the hospital with a 10/10 headache and was found to be in atrial fibrillation with RVR and hypertensive emergency with BP to 181/100. She is treated with coumadin, but her INR was subtherapeutic on arrival and again today. She did, however, attain decreased pressures and rate control with the initiation of diltiazem in the ED. Her head CT was negative for acute abnormalities.   Chronic Headache: Unclear headache diagnosis; she has recently started seeing the headache clinic where differential diagnosis includes temporal arteritis, headache due to intracranial vascular disease, headache secondary to metabolic syndrome, headache secondary to over-treatment, among others. Has had ophthalmologic evaluation for temporal arteritis and did not have temporal tenderness today. Head CT was negative. ESR was WNL. Headache has been better controlled on headache cocktail (compazine, toradol, benadryl). - tizanidine as per o/p headache regimen   Atrial Fibrillation: Chronic afib with symptomatic episode. Found to be in afib with RVR on arrival. Now rate controlled on home diltiazem. Also on coumadin at home; INR currently 1.73-->1.34 - continue diltiazem  - f/u INR; coumadin dosing per pharmacy  - tele  - home atenolol   DMII: (last A1c 8.2%), FS in 130s  - holding home metformin; SSI sensitive with CBGs   Hypertension:  - continue home lasix, losartan, atenolol   Hypokalemia:  - continue home  [place]  - trend BMET   Diet:  - heart healthy   DVT Ppx:  - on coumadin, but subtherapeutic. Started on lovenox. She follows at [facility]; they have been made aware of her INR's during this hospitalization.  Dispo: Disposition is deferred at this time, awaiting improvement of current medical problems.  Anticipated discharge in approximately 0 day(s).   The patient does have a current PCP Cresenciano Genre, MD) and does need an [facility] hospital follow-up appointment after discharge.  The patient does not have transportation limitations that hinder transportation to clinic appointments.  .Services Needed at time of discharge: Y = Yes, Blank = No PT:   OT:   RN:   Equipment:   Other:     LOS: 1 day   Drucilla Schmidt, MD 11/30/2013, 2:15 PM

## 2013-11-30 NOTE — Progress Notes (Signed)
[facility] for coumadin Indication: atrial fibrillation  Allergies  Allergen Reactions  . Gemfibrozil Swelling    REACTION: Angioedema  . Latex Other (See Comments)    Blisters where touched or applied  . Penicillins Hives    Will spread in patches all over the body.  . Adhesive [Tape] Other (See Comments)    Will blister skin where applied - do not use BAND-AIDS.  Marland Kitchen Codeine Hives    Will spread in patches all over the body.  . Ace Inhibitors     Cough   . Digoxin And Related     Makes BP drop    Patient Measurements: Height: 5\' 4"  (162.6 cm) Weight: 191 lb 11.2 oz (86.955 kg) IBW/kg (Calculated) : 54.7   Vital Signs: Temp: 97.9 F (36.6 C) (09/24 0544) Temp src: Oral (09/24 0544) BP: 153/81 mmHg (09/24 0544) Pulse Rate: 97 (09/24 0544)  Labs:  Recent Labs  11/29/13 1148 11/29/13 1300 11/30/13 0634  HGB 13.8  --   --   HCT 39.8  --   --   PLT 244  --   --   LABPROT  --  20.3* 16.6*  INR  --  1.73* 1.34  CREATININE 0.54  --  0.74    Estimated Creatinine Clearance: 74.8 ml/min (by C-G formula based on Cr of 0.74).   Medical History: Past Medical History  Diagnosis Date  . Depression   . Obesity   . Skin neoplasm   . Bunion   . Carotid stenosis   . Fibromyalgia   . Internal hemorrhoid   . GERD (gastroesophageal reflux disease)   . Chronic gastritis   . Transaminase or LDH elevation   . Mild cognitive impairment   . Hyperlipidemia   . Fatty liver   . Lumbar back pain   . Diabetic peripheral neuropathy   . Dysphagia     no documented strictures but responded positively to dilation in past.   . Hypertension   . Cholecystitis     s/p cholecystectomy  . Sarcoidosis   . Fibromyalgia   . CHF (congestive heart failure)   . Skin cancer 1990's    "front of my right leg"  . Atrial fibrillation   . OSA (obstructive sleep apnea) 2003    "can't sleep w/that darm machine"  . Exertional dyspnea   . Hypothyroidism    . Type II diabetes mellitus   . TIA (transient ischemic attack)     07/29/11 pt denies this history  . H/O hiatal hernia   . Epileptic seizure, tonic     .No meds since age of 53.  . Osteoarthritis   . Stroke     tia  . Hypothyroid   . Diverticulosis   . Digoxin toxicity     08/2011  . Bradycardia, drug induced     08/2011 (dig)  . Drug-induced hypotension     08/2011 (Dig)  . Carpal tunnel syndrome     mild   . Dysrhythmia     HX OF ATRIAL FIBRILATION    Medications:  Warfarin 2.5mg  daily with 5mg  on Sunday/Thursday  Assessment: 63 year old female with history of afib on chronic warfarin. INR remains subtherapeutic today  Goal of Therapy:  INR 2-3 Monitor platelets by anticoagulation protocol: Yes   Plan:  Warfarin 5mg  tonight Daily INR  Excell Seltzer PharmD Clinical Pharmacist Pager [phone] 11/30/2013 10:30 AM

## 2013-11-30 NOTE — Progress Notes (Signed)
  Date: 11/30/2013  Patient name: [name]  Medical record number: [id number]  Date of birth: [birth year]   I have seen and evaluated [name] and discussed their care with the Residency Team. [name] is a 63 yo woman with chronic HA that are improving after she started seeing the HA clinic. She has a PMH of atrial fibrillation (on coumadin), CHF (last echo showed normal systolic function with EF 55-65% in 2009 and myoview stress test in 2011 negative for ischemia), asymptomatic carotid artery stenosis (followed yearly; last R ICA 60-79% occluded, L ICA 1-39% occluded), TIAx2, hypertension, hyperlipidemia, DMII, hypothyroidism, OSA (with CPAP), depression, fibromyalgia, depression, OA, chronic gastritis, fatty liver disease, chronic back pain, diverticulosis, and dysphagia (with strictures and scheduled dilation 12/2013), and sarcoidosis.   She presented after the onset of the worst headache of her life that was accompanied by nausea and chest palpitations.  She fell asleep on her couch last night and awoke at midnight with pain throughout her head. She says it was the worst headache she has ever experienced; it was soon accompanied by nausea and one episode of vomiting. She has had decreased PO intake over the past few weeks and a 5 lb weight loss; she attributes both to her worsening esophageal stricture and dysphagia. She denies shortness of breath, chest pain, dizziness, numbness or tingling, changes in vision, fever or chills.   Of note, she has a history of chronic headaches. They typically last 5-10 minutes and consist of sharp, shooting pain from her temples to the crown of her head; occasionally the shooting pain occurs at the back of her head. They have been occurring for 2 months and she sought treatment at the headache center with Orie Rout, MD. He has not concluded on a diagnosis (considering temporal arteritis, headache due to intracranial vascular disease, headache  secondary to metabolic syndrome, headache secondary to over-treatment, among others), but has been treating her with steroid injections and 2 mg tyzanidine. She was recently weaned off of the ibuprofen, gabapentin and amytriptylene. She has been taking all of her medications as prescribed.  In the ED, she was found to be in atrial fibrillation with RVR on arrival. Her BP and pulse were 181/100 and 115; they fell to 139/75 and 105 after diltiazem drip was given. The patient's headache has partially subsided to 7/10 in intensity and her head CT had no acute findings.   Her HA completely resolved after a ew hrs in the ED after she was tx with dilt gtt and normalization of her BP and compazine, toradol, and benedryl. She remains HA free this AM.   She is in NAD and able to tolerate lights. FROM of neck. HRRR. LCTAB. Ext no edema.   Assessment and Plan: I have seen and evaluated the patient as outlined above. I agree with the formulated Assessment and Plan as detailed in the residents' admission note, with the following changes:   1. Acute on chronic HA - CT and hx have r/o bleed. Temporal arteritis unlikely 2/2 nl sed rate x 2 and non tender temporal. Could have been 2/2 increased BP. HA now resolved and pt is stable for D/C and F/U with her HA - has appt next week.   2. A Fib with RVR - resolved and now off dilt gtt. Likely was 2/2 increased stress rxn from HA. D/C on home meds.  3. Accelerated HTN - resolved. F/U outpt.   Bartholomew Crews, MD 9/24/20152:42 PM

## 2013-11-30 NOTE — Discharge Summary (Signed)
Name: [name] MRN: [id number] DOB: [birth year] 63 y.o. PCP: Cresenciano Genre, MD  Date of Admission: 11/29/2013 11:23 AM Date of Discharge: 11/30/2013 Attending Physician: Bartholomew Crews, MD  Discharge Diagnosis: Principal Problem:   Atrial fibrillation Active Problems:   DIABETES MELLITUS, TYPE II   Unspecified essential hypertension   Headache(784.0)  Discharge Medications:   Medication List         atenolol 25 MG tablet  Commonly known as:  TENORMIN  Take 37.5 mg by mouth 2 (two) times daily.     diltiazem 180 MG 24 hr capsule  Commonly known as:  CARDIZEM CD  Take 180 mg by mouth daily.     FLUoxetine 20 MG tablet  Commonly known as:  PROZAC  Take 1 tablet (20 mg total) by mouth daily.     furosemide 20 MG tablet  Commonly known as:  LASIX  Take 1 tablet (20 mg total) by mouth daily.     levothyroxine 75 MCG tablet  Commonly known as:  SYNTHROID, LEVOTHROID  Take 1 tablet (75 mcg total) by mouth every morning.     losartan 25 MG tablet  Commonly known as:  COZAAR  Take 0.5 tablets (12.5 mg total) by mouth daily.     metFORMIN 1000 MG tablet  Commonly known as:  GLUCOPHAGE  Take 1 tablet (1,000 mg total) by mouth 2 (two) times daily with a meal.     omeprazole 20 MG capsule  Commonly known as:  PRILOSEC  Take 1 capsule (20 mg total) by mouth daily.     potassium chloride 10 MEQ tablet  Commonly known as:  K-DUR  Take 10 mEq by mouth daily.     pravastatin 40 MG tablet  Commonly known as:  PRAVACHOL  Take 1 tablet (40 mg total) by mouth daily.     tiZANidine 4 MG tablet  Commonly known as:  ZANAFLEX  Take 4 mg by mouth every 6 (six) hours as needed for muscle spasms.     warfarin 5 MG tablet  Commonly known as:  COUMADIN  Take 2.5-5 mg by mouth at bedtime. Take 83m on Sunday and Thursday. All other days take 2.548m        Disposition and follow-up:   [facility]  At the hospital follow up visit please address:  1.  [name] to the ED with a 10/10 diffuse, whole-head headache that was present when she woke up in the middle of the night. Her headaches had been better controlled on recent [facility] She was found to be hypertensive to 180s/100s and her headache subsided with a diltiazem drip. Her head CT was negative for acute findings.  She was also found to be in atrial fibrillation with RVR (and was symptomatic in that she felt her heart racing). This was likely an acute stress response to her extreme headache pain. Her rate was controlled with the drip and remained controlled on her home diltiazem. Her INR was found to be subtherapeutic.   2.  Labs / imaging needed at time of follow-up: [facility] ([facility]  Pending labs/ test needing follow-up: none  Follow-up Appointments:     Follow-up Information   Follow up with GlOtho BellowsMD On 12/14/2013. (10:45 AM)    Specialty:  Internal Medicine   Contact information:   [address]  [place] [address] [phone]       Follow up with Orie Rout, MD On 12/06/2013. (As already scheduled)    Specialty:  Specialist   Contact information:   [address] [place] [address] [phone]       Discharge Instructions: Discharge Instructions   Diet - low sodium heart healthy    Complete by:  As directed      Increase activity slowly    Complete by:  As directed            Consultations:    Procedures Performed:  Dg Chest 2 View  11/29/2013   CLINICAL DATA:  Headache, vomiting, history diabetes, hypertension, sarcoidosis, CHF, skin cancer  EXAM: CHEST  2 VIEW  COMPARISON:  09/04/2013  FINDINGS: Enlargement of cardiac silhouette.  Atherosclerotic calcification aorta.  Mediastinal contours and pulmonary vascularity normal.  Lungs clear.  No pleural effusion or pneumothorax.  Bones  demineralized.  IMPRESSION: Enlargement of cardiac silhouette.  No acute abnormalities.   Electronically Signed   By: Lavonia Dana M.D.   On: 11/29/2013 12:50   Ct Head Wo Contrast  11/29/2013   CLINICAL DATA:  Severe headache at back of head down neck, vomiting  EXAM: CT HEAD WITHOUT CONTRAST  TECHNIQUE: Contiguous axial images were obtained from the base of the skull through the vertex without intravenous contrast.  COMPARISON:  08/18/2013  FINDINGS: Normal ventricular morphology.  No midline shift or mass effect.  Normal appearance of brain parenchyma.  No intracranial hemorrhage, mass lesion, or acute infarction.  Visualized paranasal sinuses and mastoid air cells clear.  Bones unremarkable.  Atherosclerotic calcifications at carotid siphons.  IMPRESSION: No acute intracranial abnormalities.   Electronically Signed   By: Lavonia Dana M.D.   On: 11/29/2013 13:02   Admission HPI:  [name] is a 63 yo woman presenting after the onset of the worst headache of her life that was accompanied by nausea and chest palpitations. She has a PMH of atrial fibrillation (on coumadin), CHF (last echo showed normal systolic function with EF 55-65% in 2009 and myoview stress test in 2011 negative for ischemia), asymptomatic carotid artery stenosis (followed yearly; last R ICA 60-79% occluded, L ICA 1-39% occluded), TIAx2, hypertension, hyperlipidemia, DMII, hypothyroidism, OSA (with CPAP), depression, fibromyalgia, depression, OA, chronic gastritis, fatty liver disease, chronic back pain, diverticulosis, and dysphagia (with strictures and scheduled dilation 12/2013), and sarcoidosis. She fell asleep on her couch last night and awoke at midnight with pain throughout her head. She says it was the worst headache she has ever experienced; it was soon accompanied by nausea and one episode of vomiting. She has had decreased PO intake over the past few weeks and a 5 lb weight loss; she attributes both to her worsening esophageal  stricture and dysphagia. She denies shortness of breath, chest pain, dizziness, numbness or tingling, changes in vision, fever or chills.   Of note, she has a history of chronic headaches. They typically last 5-10 minutes and consist of sharp, shooting pain from her temples to the crown of her head; occasionally the shooting pain occurs at the back of her head. They have been occurring for 2 months and she sought treatment at the headache center with Orie Rout, MD. He has not concluded on a diagnosis (considering temporal arteritis, headache due to intracranial vascular disease, headache secondary to metabolic syndrome, headache secondary to over-treatment, among others), but has been treating her with steroid injections and 2 mg tyzanidine. She was recently weaned off of  the ibuprofen, gabapentin and amytriptylene. She has been taking all of her medications as prescribed.   In the ED, she was found to be in atrial fibrillation with RVR on arrival. Her BP and pulse were 181/100 and 115; they fell to 139/75 and 105 after diltiazem drip was given. The patient's headache has partially subsided to 7/10 in intensity and her head CT had no acute findings. She will be admitted to IMTS.   Hospital Course by problem list: Principal Problem:   Atrial fibrillation Active Problems:   DIABETES MELLITUS, TYPE II   Unspecified essential hypertension   Headache(784.0)   [name] is a 63 yo woman with a complicated medical history including atrial fibrillation and chronic headaches who came to the hospital with a 10/10 headache and was found to be in atrial fibrillation with RVR and hypertensive emergency with BP to 181/100. She is treated with coumadin, but her INR was subtherapeutic on arrival and again today. She did, however, attain decreased pressures and rate control with the initiation of diltiazem in the ED. Her head CT was negative for acute abnormalities.   Chronic Headache: Patient has an unclear  headache diagnosis (she is mid-workup); she has recently started seeing the headache clinic where differential diagnosis includes temporal arteritis, headache due to intracranial vascular disease, headache secondary to metabolic syndrome, headache secondary to over-treatment, among others. Has had ophthalmologic evaluation for temporal arteritis and had no temporal tenderness on exam and a normal ESR. Head CT was negative. Pain was better controlled with diltiazem drip and then subsided completely after headache cocktail (compazine, toradol, benadryl).   Atrial Fibrillation: Patient has chronic atrial fibrillation and had a symptomatic episode (she felt palpitations). Found to be in afib with RVR on arrival. This was likely an acute stress response to her extreme headache pain. Was brought to rate control on diltiazem drip in the ED and then remained rate controlled on home diltiazem.On coumadin at home; INR currently 1.73-->1.34   Hypokalemia: 3.4.--> 4.0 with kdur. Takes kdur at home.  Discharge Vitals:   BP 153/81  Pulse 97  Temp(Src) 97.9 F (36.6 C) (Oral)  Resp 16  Ht 5' 4"  (1.626 m)  Wt 191 lb 11.2 oz (86.955 kg)  BMI 32.89 kg/m2  SpO2 98%  LMP 06/03/1978  Signed: Drucilla Schmidt, MD 11/30/2013, 2:29 PM    Services Ordered on Discharge: none Equipment Ordered on Discharge: none

## 2013-11-30 NOTE — Discharge Instructions (Signed)
You had headache pain before coming into the hospital ; the acute stress of it may have pushed you into atrial fibrillation.   The best treatment for your atrial fibrillation is to continue reliably taking your coumadin and continuing to have the clinic follow your INR.   We have made your coumadin clinic aware of your INR levels here; please follow up with them as to whether they would like you to come in tomorrow, change your dose etc.

## 2013-11-30 NOTE — Progress Notes (Signed)
UR completed 

## 2013-11-30 NOTE — Progress Notes (Signed)
[facility] for coumadin Indication: atrial fibrillation  Allergies  Allergen Reactions  . Gemfibrozil Swelling    REACTION: Angioedema  . Latex Other (See Comments)    Blisters where touched or applied  . Penicillins Hives    Will spread in patches all over the body.  . Adhesive [Tape] Other (See Comments)    Will blister skin where applied - do not use BAND-AIDS.  Marland Kitchen Codeine Hives    Will spread in patches all over the body.  . Ace Inhibitors     Cough   . Digoxin And Related     Makes BP drop    Patient Measurements: Height: 5\' 4"  (162.6 cm) Weight: 191 lb 11.2 oz (86.955 kg) IBW/kg (Calculated) : 54.7   Vital Signs: Temp: 97.9 F (36.6 C) (09/24 0544) Temp src: Oral (09/24 0544) BP: 153/81 mmHg (09/24 0544) Pulse Rate: 97 (09/24 0544)  Labs:  Recent Labs  11/29/13 1148 11/29/13 1300 11/30/13 0634  HGB 13.8  --   --   HCT 39.8  --   --   PLT 244  --   --   LABPROT  --  20.3* 16.6*  INR  --  1.73* 1.34  CREATININE 0.54  --  0.74    Estimated Creatinine Clearance: 74.8 ml/min (by C-G formula based on Cr of 0.74).   Medical History: Past Medical History  Diagnosis Date  . Depression   . Obesity   . Skin neoplasm   . Bunion   . Carotid stenosis   . Fibromyalgia   . Internal hemorrhoid   . GERD (gastroesophageal reflux disease)   . Chronic gastritis   . Transaminase or LDH elevation   . Mild cognitive impairment   . Hyperlipidemia   . Fatty liver   . Lumbar back pain   . Diabetic peripheral neuropathy   . Dysphagia     no documented strictures but responded positively to dilation in past.   . Hypertension   . Cholecystitis     s/p cholecystectomy  . Sarcoidosis   . Fibromyalgia   . CHF (congestive heart failure)   . Skin cancer 1990's    "front of my right leg"  . Atrial fibrillation   . OSA (obstructive sleep apnea) 2003    "can't sleep w/that darm machine"  . Exertional dyspnea   . Hypothyroidism    . Type II diabetes mellitus   . TIA (transient ischemic attack)     07/29/11 pt denies this history  . H/O hiatal hernia   . Epileptic seizure, tonic     .No meds since age of 28.  . Osteoarthritis   . Stroke     tia  . Hypothyroid   . Diverticulosis   . Digoxin toxicity     08/2011  . Bradycardia, drug induced     08/2011 (dig)  . Drug-induced hypotension     08/2011 (Dig)  . Carpal tunnel syndrome     mild   . Dysrhythmia     HX OF ATRIAL FIBRILATION    Medications:  Warfarin 2.5mg  daily with 5mg  on Sunday/Thursday  Assessment: 63 year old female with history of afib on chronic warfarin. INR remains subtherapeutic today.  She did not get dose of coumadin last night  Goal of Therapy:  INR 2-3 Monitor platelets by anticoagulation protocol: Yes   Plan:  Warfarin 5mg  tonight Daily INR  Excell Seltzer PharmD Clinical Pharmacist Pager [phone] 11/30/2013 10:32 AM

## 2013-11-30 NOTE — Care Management Note (Signed)
    Page 1 of 1   11/30/2013     11:10:03 AM CARE MANAGEMENT NOTE 11/30/2013  Patient:  [name], [name]   Account Number:  [id number]  Date Initiated:  11/30/2013  Documentation initiated by:  GRAVES-BIGELOW,Aadya Kindler  Subjective/Objective Assessment:   Pt admitted for headache, vomiting and chest palpitations that started overnight. Pt is from home alone.     Action/Plan:   CM received referral for medication needs. Unable to assist due to insurance. [facility] [place] and is on all generics. CM did provide pt with [employer]. No fruther needs from CM at this time.   Anticipated DC Date:  12/01/2013   Anticipated DC Plan:  [place]  CM consult  Medication Assistance      Choice offered to / List presented to:             Status of service:  Completed, signed off Medicare Important Message given?  NO (If response is "NO", the following Medicare IM given date fields will be blank) Date Medicare IM given:   Medicare IM given by:   Date Additional Medicare IM given:   Additional Medicare IM given by:    Discharge Disposition:  HOME/SELF CARE  Per UR Regulation:  Reviewed for med. necessity/level of care/duration of stay  If discussed at [place] of Stay Meetings, dates discussed:    Comments:

## 2013-12-01 ENCOUNTER — Ambulatory Visit (INDEPENDENT_AMBULATORY_CARE_PROVIDER_SITE_OTHER): Payer: Medicare Other | Admitting: *Deleted

## 2013-12-01 DIAGNOSIS — I4891 Unspecified atrial fibrillation: Secondary | ICD-10-CM

## 2013-12-01 DIAGNOSIS — G459 Transient cerebral ischemic attack, unspecified: Secondary | ICD-10-CM

## 2013-12-01 DIAGNOSIS — Z5181 Encounter for therapeutic drug level monitoring: Secondary | ICD-10-CM

## 2013-12-01 LAB — POCT INR: INR: 1.4

## 2013-12-01 MED ORDER — ENOXAPARIN SODIUM 150 MG/ML ~~LOC~~ SOLN: 130.0000 mg | SUBCUTANEOUS | Status: DC

## 2013-12-01 NOTE — Patient Instructions (Signed)
10/10 /15 Take last dose of coumadin 12/17/13  No Lovenox or coumadin 12/18/13  Lovenox 130mg  SQ @ 9:00am 12/19/13  Lovenox 130mg  SQ @ 9:00am 12/20/12  Lovenox 130mg  SQ @ 9:00am 12/21/13  Lovenox 130mg  SQ @ 9:00am 12/22/13  No Lovenox--------endoscopy-------coumadin 5mg  PM 12/23/13  Lovenox 130mg  SQ @ 9:00am & coumadin 5mg  PM 12/24/13  Lovenox 130mg  SQ @ 9:00am & coumadin 5mg  PM 12/25/13  Lovenox 130mg  SQ @ 9:00am & coumadin 5mg  PM 12/26/13  Lovenox 130mg  SQ @ 9:00am &  INR appt @ 1:40pm

## 2013-12-06 DIAGNOSIS — M542 Cervicalgia: Secondary | ICD-10-CM | POA: Diagnosis not present

## 2013-12-06 DIAGNOSIS — IMO0001 Reserved for inherently not codable concepts without codable children: Secondary | ICD-10-CM | POA: Diagnosis not present

## 2013-12-06 DIAGNOSIS — G518 Other disorders of facial nerve: Secondary | ICD-10-CM | POA: Diagnosis not present

## 2013-12-06 DIAGNOSIS — R51 Headache: Secondary | ICD-10-CM | POA: Diagnosis not present

## 2013-12-08 ENCOUNTER — Ambulatory Visit (INDEPENDENT_AMBULATORY_CARE_PROVIDER_SITE_OTHER): Payer: Medicare Other | Admitting: *Deleted

## 2013-12-08 DIAGNOSIS — Z5181 Encounter for therapeutic drug level monitoring: Secondary | ICD-10-CM

## 2013-12-08 DIAGNOSIS — I4891 Unspecified atrial fibrillation: Secondary | ICD-10-CM | POA: Diagnosis not present

## 2013-12-08 LAB — POCT INR: INR: 2.4

## 2013-12-08 NOTE — Patient Instructions (Signed)
10/10 /15 Take last dose of coumadin  12/17/13 No Lovenox or coumadin  12/18/13 Lovenox 130mg  SQ @ 9:00am  12/19/13 Lovenox 130mg  SQ @ 9:00am  12/20/12 Lovenox 130mg  SQ @ 9:00am  12/21/13 Lovenox 130mg  SQ @ 9:00am  12/22/13 No Lovenox--------endoscopy-------coumadin 5mg  PM  12/23/13 Lovenox 130mg  SQ @ 9:00am & coumadin 5mg  PM  12/24/13 Lovenox 130mg  SQ @ 9:00am & coumadin 5mg  PM  12/25/13 Lovenox 130mg  SQ @ 9:00am & coumadin 5mg  PM  12/26/13 Lovenox 130mg  SQ @ 9:00am & INR appt @ 1:40pm

## 2013-12-14 ENCOUNTER — Ambulatory Visit (INDEPENDENT_AMBULATORY_CARE_PROVIDER_SITE_OTHER): Payer: Medicare Other | Admitting: Internal Medicine

## 2013-12-14 ENCOUNTER — Encounter: Payer: Self-pay | Admitting: Internal Medicine

## 2013-12-14 VITALS — BP 127/81 | HR 108 | Temp 97.4°F | Wt 190.8 lb

## 2013-12-14 DIAGNOSIS — I10 Essential (primary) hypertension: Secondary | ICD-10-CM | POA: Diagnosis not present

## 2013-12-14 DIAGNOSIS — I6529 Occlusion and stenosis of unspecified carotid artery: Secondary | ICD-10-CM

## 2013-12-14 DIAGNOSIS — I481 Persistent atrial fibrillation: Secondary | ICD-10-CM

## 2013-12-14 DIAGNOSIS — I4819 Other persistent atrial fibrillation: Secondary | ICD-10-CM

## 2013-12-14 DIAGNOSIS — E039 Hypothyroidism, unspecified: Secondary | ICD-10-CM

## 2013-12-14 DIAGNOSIS — Z09 Encounter for follow-up examination after completed treatment for conditions other than malignant neoplasm: Secondary | ICD-10-CM

## 2013-12-14 DIAGNOSIS — K088 Other specified disorders of teeth and supporting structures: Secondary | ICD-10-CM

## 2013-12-14 DIAGNOSIS — E785 Hyperlipidemia, unspecified: Secondary | ICD-10-CM | POA: Diagnosis present

## 2013-12-14 DIAGNOSIS — K089 Disorder of teeth and supporting structures, unspecified: Secondary | ICD-10-CM

## 2013-12-14 LAB — TSH: TSH: 0.331 u[IU]/mL — ABNORMAL LOW (ref 0.350–4.500)

## 2013-12-14 LAB — T4, FREE: Free T4: 1.5 ng/dL (ref 0.80–1.80)

## 2013-12-14 MED ORDER — DILTIAZEM HCL ER COATED BEADS 180 MG PO CP24: 180.0000 mg | ORAL_CAPSULE | Freq: Every day | ORAL | Status: DC

## 2013-12-14 MED ORDER — POTASSIUM CHLORIDE ER 10 MEQ PO TBCR: 10.0000 meq | EXTENDED_RELEASE_TABLET | Freq: Every day | ORAL | Status: DC

## 2013-12-14 NOTE — Progress Notes (Signed)
Subjective:   Patient ID: [name] female   DOB: [birth year] 63 y.o.   MRN: [id number]  HPI: [name] Media is a 63 y.o. woman pmh as listed below presents for HFU.   The patient was recently hospitalized 12/05/13 for HA and found to be in afib rvr. The patient was treated with diltiazem GGT before being transitioned to oral diltiazem. The patient was also subtherapeutic on her INR and given Coumadin. Since that time the patient reports that she is overall feeling better and is only intermittently having some sensation of palpitations but nothing like when she was admitted. She continues to take all her medications as prescribed and brings her medications as visit. All of these were individually reviewed with the patient. She denied any chest pain, shortness of breath, dyspnea on exertion, lower extremity edema, nausea vomiting or diarrhea.   In terms of her headache she is followed by Dr. Domingo Cocking and headache clinic and is been currently managed by her point injections before possibly proceeding to Botox. The patient states that she is receiving these injections every 2 weeks and she will be following up for her repeat 2 week visit next week. She is starting to have some headache that is very typical of these chronic headaches. She denied any photophobia or phonophobia, nausea vomiting or diarrhea, blurry vision, or preceding aura. She is also not noticed any unilateral weakness, facial droop, or dysphagia.  Her other main concern is her teeth she is having some gum irritation. She notices that when she continues to brush her teeth that some of her crowns have dislodged and she has some bleeding around the site. She denies any sour taste in her mouth, any neck swelling or neck pain, and no trouble swallowing or chewing.  Past Medical History  Diagnosis Date  . Depression   . Obesity   . Skin neoplasm   . Bunion   . Carotid stenosis   . Fibromyalgia   . Internal hemorrhoid   . GERD  (gastroesophageal reflux disease)   . Chronic gastritis   . Transaminase or LDH elevation   . Mild cognitive impairment   . Hyperlipidemia   . Fatty liver   . Lumbar back pain   . Diabetic peripheral neuropathy   . Dysphagia     no documented strictures but responded positively to dilation in past.   . Hypertension   . Cholecystitis     s/p cholecystectomy  . Sarcoidosis   . Fibromyalgia   . CHF (congestive heart failure)   . Skin cancer 1990's    "front of my right leg"  . Atrial fibrillation   . OSA (obstructive sleep apnea) 2003    "can't sleep w/that darm machine"  . Exertional dyspnea   . Hypothyroidism   . Type II diabetes mellitus   . TIA (transient ischemic attack)     07/29/11 pt denies this history  . H/O hiatal hernia   . Epileptic seizure, tonic     .No meds since age of 30.  . Osteoarthritis   . Stroke     tia  . Hypothyroid   . Diverticulosis   . Digoxin toxicity     08/2011  . Bradycardia, drug induced     08/2011 (dig)  . Drug-induced hypotension     08/2011 (Dig)  . Carpal tunnel syndrome     mild   . Dysrhythmia     HX OF ATRIAL FIBRILATION   Current Outpatient Prescriptions  Medication  Sig Dispense Refill  . atenolol (TENORMIN) 25 MG tablet Take 37.5 mg by mouth 2 (two) times daily.      Marland Kitchen diltiazem (CARDIZEM CD) 180 MG 24 hr capsule Take 1 capsule (180 mg total) by mouth daily.  30 capsule  2  . enoxaparin (LOVENOX) 150 MG/ML injection Inject 0.87 mLs (130 mg total) into the skin daily.  10 Syringe  0  . FLUoxetine (PROZAC) 20 MG tablet Take 1 tablet (20 mg total) by mouth daily.  90 tablet  4  . furosemide (LASIX) 20 MG tablet Take 1 tablet (20 mg total) by mouth daily.  90 tablet  4  . levothyroxine (SYNTHROID, LEVOTHROID) 75 MCG tablet Take 1 tablet (75 mcg total) by mouth every morning.  90 tablet  4  . losartan (COZAAR) 25 MG tablet Take 0.5 tablets (12.5 mg total) by mouth daily.  90 tablet  1  . metFORMIN (GLUCOPHAGE) 1000 MG tablet Take 1  tablet (1,000 mg total) by mouth 2 (two) times daily with a meal.  180 tablet  1  . omeprazole (PRILOSEC) 20 MG capsule Take 1 capsule (20 mg total) by mouth daily.  90 capsule  4  . potassium chloride (K-DUR) 10 MEQ tablet Take 1 tablet (10 mEq total) by mouth daily.  30 tablet  2  . pravastatin (PRAVACHOL) 40 MG tablet Take 1 tablet (40 mg total) by mouth daily.  90 tablet  4  . tiZANidine (ZANAFLEX) 4 MG tablet Take 4 mg by mouth every 6 (six) hours as needed for muscle spasms.      Marland Kitchen warfarin (COUMADIN) 5 MG tablet Take 2.5-5 mg by mouth at bedtime. Take 5mg  on Sunday and Thursday. All other days take 2.5mg .       No current facility-administered medications for this visit.   Family History  Problem Relation Age of Onset  . Crohn's disease Mother   . Colitis Mother     Crohns  . Anesthesia problems Mother   . Arthritis Mother     rheumatoid; maternal side of family also with RA  . Cancer Father     skin  . Diabetes Father   . Heart disease Father   . Melanoma Father   . Coronary artery disease Father   . Diabetes Sister   . Depression Maternal Uncle   . Dementia Maternal Uncle   . Hypotension Neg Hx   . Malignant hyperthermia Neg Hx   . Pseudochol deficiency Neg Hx   . Colon cancer Paternal Uncle     ? if stomach or colon    History   Social History  . Marital Status: Divorced    Spouse Name: N/A    Number of Children: 2  . Years of Education: 12th    Occupational History  . cosmotologist    Social History Main Topics  . Smoking status: Never Smoker   . Smokeless tobacco: Never Used  . Alcohol Use: No  . Drug Use: No  . Sexual Activity: Not Currently   Other Topics Concern  . None   Social History Narrative   Divorced, on disability, has 2 kids (son in [place], daughter in [place]), owns a house in [place], [place] and has a female roommate    No caffeine    Review of Systems: Pertinent items are noted in HPI. Objective:  Physical Exam: Filed Vitals:   12/14/13  1032  BP: 127/81  Pulse: 108  Temp: 97.4 F (36.3 C)  TempSrc: Oral  Weight:  190 lb 12.8 oz (86.546 kg)  SpO2: 98%   General: Sitting in chair, NAD HEENT: PERRL, EOMI, no scleral icterus, missing many teeth on the lower left jaw, many dislodged caps on teeth, some erythema surrounding the frontal teeth but no frank bleeding, no frank abscess, no other lesions seen, MMM, no cervical LAD Cardiac: Irregularly irregular, no rubs, murmurs or gallops Pulm: clear to auscultation bilaterally, no crackles wheezes or rhonchi, moving normal volumes of air Abd: soft, nontender, nondistended, BS present Ext: warm and well perfused, no pedal edema Neuro: alert and oriented X3, cranial nerves II-XII grossly intact  Assessment & Plan:  Please see problem oriented charting  Pt discussed with Dr. Lynnae January

## 2013-12-14 NOTE — Telephone Encounter (Signed)
See most recent note

## 2013-12-14 NOTE — Assessment & Plan Note (Signed)
There does not seem to be any systemic infection at this time both on physical exam and systemic signs. The patient will not be prescribed any antibiotics at this time. The patient was given information regarding free dental clinic.

## 2013-12-14 NOTE — Assessment & Plan Note (Signed)
Will recheck TSH and free T4 level today -Continue Synthroid 75 mcg

## 2013-12-14 NOTE — Assessment & Plan Note (Addendum)
The patient seems to be well-controlled at this time with heart rates in the 100s today. She still appears to be in A. fib as she has irregularly irregular heartbeat. The patient is not having any symptoms. -We'll continue diltiazem 180 mg daily and atenolol 25 mg. -Patient will followup with Dr. Rayann Heman  -Continue Coumadin -Will recheck TSH level and T4 as last checked back in 2014 as this may be contributing factor

## 2013-12-14 NOTE — Assessment & Plan Note (Signed)
BP Readings from Last 3 Encounters:  12/14/13 127/81  11/30/13 153/81  11/10/13 90/62    Lab Results  Component Value Date   NA 143 11/30/2013   K 4.0 11/30/2013   CREATININE 0.74 11/30/2013    Assessment: Blood pressure control:   Progress toward BP goal:    Comments: at goal   Plan: Medications:  continue current medications, of lasix 20mg  qd, losartan 25mg  qd, diltiazem and atenolol both also for afib Educational resources provided:   Self management tools provided:   Other plans: f/u in 3 months

## 2013-12-14 NOTE — Patient Instructions (Signed)
General Instructions: For your mouth we have given you some information about the dental clinic.   In terms of your thyroid we will check some blood work.   For your blood pressure and afib keep taking your medicines. Keep up the good work!  Thank you for bringing your medicines today. This helps [place] keep you safe from mistakes.   Progress Toward Treatment Goals:  Treatment Goal 09/07/2013  Hemoglobin A1C unchanged  Blood pressure at goal  Prevent falls at goal    Self Care Goals & Plans:  Self Care Goal 09/07/2013  Manage my medications take my medicines as prescribed; bring my medications to every visit; refill my medications on time  Monitor my health keep track of my blood pressure; check my feet daily; keep track of my weight  Eat healthy foods drink diet soda or water instead of juice or soda; eat more vegetables; eat foods that are low in salt; eat baked foods instead of fried foods; eat fruit for snacks and desserts; eat smaller portions  Be physically active find an activity I enjoy  Prevent falls -  Meeting treatment goals maintain the current self-care plan    Home Blood Glucose Monitoring 09/07/2013  Check my blood sugar no home glucose monitoring  When to check my blood sugar N/A     Care Management & Community Referrals:  Referral 09/07/2013  Referrals made for care management support none needed  Referrals made to community resources none

## 2013-12-15 ENCOUNTER — Telehealth: Payer: Self-pay | Admitting: Cardiology

## 2013-12-15 ENCOUNTER — Telehealth: Payer: Self-pay | Admitting: Internal Medicine

## 2013-12-15 DIAGNOSIS — E038 Other specified hypothyroidism: Secondary | ICD-10-CM

## 2013-12-15 MED ORDER — LEVOTHYROXINE SODIUM 50 MCG PO TABS: 50.0000 ug | ORAL_TABLET | Freq: Every morning | ORAL | Status: DC

## 2013-12-15 NOTE — Telephone Encounter (Signed)
  Reason for call:   I placed an outgoing call to [name] at 100  PM regarding her lab results. She was told that she is over repleated at this time and that we will need to decrease her dose to 50 mcg qd. She was also informed that she needed to follow up with her PCP in 6-8 wks for recheck. The patient verbalized understanding and all her questions were answered.   Assessment/ Plan:   New prescription sent to listed pharmacy that was verified with patient   As always, pt is advised that if symptoms worsen or new symptoms arise, they should go to an urgent care facility or to to ER for further evaluation.   Clinton Gallant, MD   12/15/2013, 1:13 PM

## 2013-12-15 NOTE — Telephone Encounter (Signed)
Called spoke with pt, pt was scheduled for endoscopy on 12/22/13 with Dr Henrene Pastor.  Pt was to hold Coumadin 5 days prior and bridge with Lovenox.  Pt states the Lovenox injections were too expensive and she cannot afford at this time.  Pt cancelled procedure and has not rescheduled at this time.  Pt aware to call back and notify us when procedure is rescheduled, she will need to be bridged with Lovenox if she needs to come off of her Coumadin.  Pt was instructed to continue on same dosage of Coumadin 2.5mg  daily except 5mg  on Sundays and Thursdays.  Follow-up appointment was rescheduled from 10/20 to 10/29, 4 weeks after her last appt since INR was therapeutic and she will not be holding Coumadin and bridging with Lovenox at this time. Pt verbalized understanding, aware of appt date and time.

## 2013-12-15 NOTE — Telephone Encounter (Signed)
Patient is not having her procedure and wants to know if she can reschedule her appointment out to a later date

## 2013-12-18 NOTE — Progress Notes (Signed)
Internal Medicine Clinic Attending  Case discussed with Dr. Sadek soon after the resident saw the patient.  We reviewed the resident's history and exam and pertinent patient test results.  I agree with the assessment, diagnosis, and plan of care documented in the resident's note. 

## 2013-12-20 DIAGNOSIS — M542 Cervicalgia: Secondary | ICD-10-CM | POA: Diagnosis not present

## 2013-12-20 DIAGNOSIS — M791 Myalgia: Secondary | ICD-10-CM | POA: Diagnosis not present

## 2013-12-20 DIAGNOSIS — R51 Headache: Secondary | ICD-10-CM | POA: Diagnosis not present

## 2013-12-21 ENCOUNTER — Encounter: Payer: Self-pay | Admitting: Internal Medicine

## 2013-12-21 ENCOUNTER — Ambulatory Visit (INDEPENDENT_AMBULATORY_CARE_PROVIDER_SITE_OTHER): Payer: Medicare Other | Admitting: Internal Medicine

## 2013-12-21 VITALS — BP 124/70 | HR 98 | Temp 98.0°F | Ht 63.0 in | Wt 192.6 lb

## 2013-12-21 DIAGNOSIS — R519 Headache, unspecified: Secondary | ICD-10-CM

## 2013-12-21 DIAGNOSIS — R51 Headache: Secondary | ICD-10-CM | POA: Diagnosis not present

## 2013-12-21 DIAGNOSIS — I10 Essential (primary) hypertension: Secondary | ICD-10-CM | POA: Diagnosis not present

## 2013-12-21 DIAGNOSIS — E119 Type 2 diabetes mellitus without complications: Secondary | ICD-10-CM

## 2013-12-21 DIAGNOSIS — Z0000 Encounter for general adult medical examination without abnormal findings: Secondary | ICD-10-CM | POA: Diagnosis not present

## 2013-12-21 DIAGNOSIS — I6529 Occlusion and stenosis of unspecified carotid artery: Secondary | ICD-10-CM | POA: Diagnosis not present

## 2013-12-21 DIAGNOSIS — E039 Hypothyroidism, unspecified: Secondary | ICD-10-CM | POA: Diagnosis not present

## 2013-12-21 DIAGNOSIS — I482 Chronic atrial fibrillation, unspecified: Secondary | ICD-10-CM

## 2013-12-21 DIAGNOSIS — Z23 Encounter for immunization: Secondary | ICD-10-CM

## 2013-12-21 DIAGNOSIS — R1310 Dysphagia, unspecified: Secondary | ICD-10-CM | POA: Diagnosis not present

## 2013-12-21 LAB — GLUCOSE, CAPILLARY: Glucose-Capillary: 188 mg/dL — ABNORMAL HIGH (ref 70–99)

## 2013-12-21 LAB — POCT GLYCOSYLATED HEMOGLOBIN (HGB A1C): Hemoglobin A1C: 6.6

## 2013-12-21 MED ORDER — POTASSIUM CHLORIDE ER 10 MEQ PO TBCR: 10.0000 meq | EXTENDED_RELEASE_TABLET | Freq: Every day | ORAL | Status: DC

## 2013-12-21 MED ORDER — DILTIAZEM HCL ER COATED BEADS 180 MG PO CP24: 180.0000 mg | ORAL_CAPSULE | Freq: Every day | ORAL | Status: DC

## 2013-12-21 MED ORDER — TIZANIDINE HCL 4 MG PO TABS: 4.0000 mg | ORAL_TABLET | Freq: Two times a day (BID) | ORAL | Status: DC | PRN

## 2013-12-21 NOTE — Progress Notes (Addendum)
   Subjective:    Patient ID: [name], female    DOB: [birth year], 63 y.o.   MRN: [id number]  HPI Comments: 63 y.o with multiple chronic medical problems. She presents for f/u   1. HTN controlled BP 114/72 with repeat 124/70 2. DM 2 controlled HA1C 6.6 today with cbg 188 3. H/o hypothyroidism with recent subclinical hyperthyroidism and dose of synthryoid decreased 75 to 50 mcg. Will repeat TSH, free T4 at next f/u  4. She is feeling well today except for chronic h/a and trigger point inj and stopping medications like Elavil, Flexeril, Neurontin has helped.  She will f/u with h/a clinic. Zanaflex 4 mg q12 prn is helping as well 5. H/o AF HR initially 108 then decreased to 98. INR recently2.4 on 10/2.  Has repeat INR 10/29 in [place] 6. H/o dysphagia unable to get esophagus stretched (Dr. Henrene Pastor) 12/22/13 d/t not able to afford Lovenox 130 mg sq b/c it was $145. Spoke to [employer] advised pt to ask pharmacy about benefits and disc with pt she may need prior authorization from prescriber prior to getting Lovenox filled.  If dysphagia worsens she will need to get Lovenox and reschedule esophageal dilation.  7. She has poor dentition and all crowns falling out of her mouth.  No money for dental care at this time. 8. Carotid artery disease-reviewed recent notes CAD stable and will have repeat US in 1 year ~08/2014  9. She needs Rx refills of Diltiazem, Kdur today.    HM-mammo due 02/2014, will get Tdap today and given Rx for Zoster       Review of Systems  HENT: Positive for dental problem.   Respiratory: Negative for shortness of breath.   Cardiovascular: Negative for chest pain and leg swelling.  Neurological: Positive for headaches.       Objective:   Physical Exam  Nursing note and vitals reviewed. Constitutional: She is oriented to person, place, and time. Vital signs are normal. She appears well-developed and well-nourished. She is cooperative. No distress.  HENT:  Head:  Normocephalic and atraumatic.  Mouth/Throat: Oropharynx is clear and moist. Abnormal dentition. No oropharyngeal exudate.  Eyes: Conjunctivae are normal. Right eye exhibits no discharge. Left eye exhibits no discharge. No scleral icterus.  Cardiovascular: Normal rate, S1 normal, S2 normal and normal heart sounds.  An irregularly irregular rhythm present.  No murmur heard. No lower ext edema  Pulmonary/Chest: Effort normal and breath sounds normal. No respiratory distress. She has no wheezes.  Abdominal: Soft. Bowel sounds are normal. There is no tenderness.  Neurological: She is alert and oriented to person, place, and time. Gait normal.  CN 2-12 grossly intact   Skin: Skin is warm, dry and intact. No rash noted. She is not diaphoretic.  Psychiatric: She has a normal mood and affect. Her speech is normal and behavior is normal. Judgment and thought content normal. Cognition and memory are normal.          Assessment & Plan:  F/u in 3-4 months

## 2013-12-21 NOTE — Assessment & Plan Note (Signed)
Lab Results  Component Value Date   HGBA1C 6.6 12/21/2013   HGBA1C 8.2 09/07/2013   HGBA1C 8.1 06/13/2013     Assessment: Diabetes control: good control (HgbA1C at goal) Progress toward A1C goal:  at goal Comments: improved   Plan: Medications:  continue current medications (Metformin 1000 mg bid) Home glucose monitoring: Frequency: no home glucose monitoring Timing: N/A Instruction/counseling given: reminded to bring medications to each visit Other plans: Doing well f/u in 3-4 months

## 2013-12-21 NOTE — Assessment & Plan Note (Signed)
Slight h/a today but trigger point inj helped yesterday following with h/a clinic Rx refill Zanaflex 4 mg bid prn

## 2013-12-21 NOTE — Assessment & Plan Note (Signed)
Pt unable to get endoscopy and dilation 10/16 due to inability to afford Lovenox  Spoke to social work today and rec speak to pharm about med benefits and med may have to be prior auth by prescriber

## 2013-12-21 NOTE — Assessment & Plan Note (Addendum)
Controlled at goal today  Continue Atenolol 37.5 bid, Dilt 180 XR, Lasix 20, Cozaar 12.5 mg qd, statin Rx refill Dilt today and Kdur

## 2013-12-21 NOTE — Assessment & Plan Note (Signed)
Repeat HR with rate controlled  Rx refill Dilt 180 XL, Continue Atenolol 37.5 bid  Follows with cardiology INR 2.4 on 10/2 repeat INR 10/29 with cards in [place]

## 2013-12-21 NOTE — Assessment & Plan Note (Signed)
Pt recently had subclinical hyperthyroidism and Synthroid reduced 75 to 50  Will check TSH and Free T4 at f/u

## 2013-12-21 NOTE — Assessment & Plan Note (Signed)
mammo due 02/2014, will give Tdap today and Zoster Rx

## 2013-12-21 NOTE — Patient Instructions (Signed)
General Instructions: Follow up in 3-4 months  Take care  Will give you TDAP vaccine today  Please follow up with Cardiology for Atrial Fibrillation and Neurology for headaches    Treatment Goals:  Goals (1 Years of Data) as of 12/21/13         As of Today 12/14/13 11/30/13 11/29/13 11/29/13     Blood Pressure    . Blood Pressure < 140/90  114/72 127/81 153/81 139/75 130/80     Result Component    . HEMOGLOBIN A1C < 7.0  6.6          Progress Toward Treatment Goals:  Treatment Goal 12/21/2013  Hemoglobin A1C at goal  Blood pressure at goal  Prevent falls -    Self Care Goals & Plans:  Self Care Goal 12/21/2013  Manage my medications take my medicines as prescribed; bring my medications to every visit  Monitor my health keep track of my blood glucose; bring my glucose meter and log to each visit; check my feet daily; keep track of my blood pressure  Eat healthy foods eat more vegetables; eat foods that are low in salt; eat baked foods instead of fried foods  Be physically active find an activity I enjoy  Prevent falls -  Meeting treatment goals maintain the current self-care plan    Home Blood Glucose Monitoring 12/21/2013  Check my blood sugar no home glucose monitoring  When to check my blood sugar N/A     Care Management & Community Referrals:  Referral 12/21/2013  Referrals made for care management support none needed  Referrals made to community resources none     Tdap Vaccine (Tetanus, Diphtheria, Pertussis): What You Need to Know 1. Why get vaccinated? Tetanus, diphtheria and pertussis can be very serious diseases, even for adolescents and adults. Tdap vaccine can protect [place] from these diseases. TETANUS (Lockjaw) causes painful muscle tightening and stiffness, usually all over the body.  It can lead to tightening of muscles in the head and neck so you can't open your mouth, swallow, or sometimes even breathe. Tetanus kills about 1 out of 5 people who are  infected. DIPHTHERIA can cause a thick coating to form in the back of the throat.  It can lead to breathing problems, paralysis, heart failure, and death. PERTUSSIS (Whooping Cough) causes severe coughing spells, which can cause difficulty breathing, vomiting and disturbed sleep.  It can also lead to weight loss, incontinence, and rib fractures. Up to 2 in 100 adolescents and 5 in 100 adults with pertussis are hospitalized or have complications, which could include pneumonia or death. These diseases are caused by bacteria. Diphtheria and pertussis are spread from person to person through coughing or sneezing. Tetanus enters the body through cuts, scratches, or wounds. Before vaccines, the [place] saw as many as 200,000 cases a year of diphtheria and pertussis, and hundreds of cases of tetanus. Since vaccination began, tetanus and diphtheria have dropped by about 99% and pertussis by about 80%. 2. Tdap vaccine Tdap vaccine can protect adolescents and adults from tetanus, diphtheria, and pertussis. One dose of Tdap is routinely given at age 50 or 70. People who did not get Tdap at that age should get it as soon as possible. Tdap is especially important for health care professionals and anyone having close contact with a baby younger than 12 months. Pregnant women should get a dose of Tdap during every pregnancy, to protect the newborn from pertussis. Infants are most at risk for severe,  life-threatening complications from pertussis. A similar vaccine, called Td, protects from tetanus and diphtheria, but not pertussis. A Td booster should be given every 10 years. Tdap may be given as one of these boosters if you have not already gotten a dose. Tdap may also be given after a severe cut or burn to prevent tetanus infection. Your doctor can give you more information. Tdap may safely be given at the same time as other vaccines. 3. Some people should not get this vaccine  If you ever had a  life-threatening allergic reaction after a dose of any tetanus, diphtheria, or pertussis containing vaccine, OR if you have a severe allergy to any part of this vaccine, you should not get Tdap. Tell your doctor if you have any severe allergies.  If you had a coma, or long or multiple seizures within 7 days after a childhood dose of DTP or DTaP, you should not get Tdap, unless a cause other than the vaccine was found. You can still get Td.  Talk to your doctor if you:  have epilepsy or another nervous system problem,  had severe pain or swelling after any vaccine containing diphtheria, tetanus or pertussis,  ever had Guillain-Barr Syndrome (GBS),  aren't feeling well on the day the shot is scheduled. 4. Risks of a vaccine reaction With any medicine, including vaccines, there is a chance of side effects. These are usually mild and go away on their own, but serious reactions are also possible. Brief fainting spells can follow a vaccination, leading to injuries from falling. Sitting or lying down for about 15 minutes can help prevent these. Tell your doctor if you feel dizzy or light-headed, or have vision changes or ringing in the ears. Mild problems following Tdap (Did not interfere with activities)  Pain where the shot was given (about 3 in 4 adolescents or 2 in 3 adults)  Redness or swelling where the shot was given (about 1 person in 5)  Mild fever of at least 100.85F (up to about 1 in 25 adolescents or 1 in 100 adults)  Headache (about 3 or 4 people in 10)  Tiredness (about 1 person in 3 or 4)  Nausea, vomiting, diarrhea, stomach ache (up to 1 in 4 adolescents or 1 in 10 adults)  Chills, body aches, sore joints, rash, swollen glands (uncommon) Moderate problems following Tdap (Interfered with activities, but did not require medical attention)  Pain where the shot was given (about 1 in 5 adolescents or 1 in 100 adults)  Redness or swelling where the shot was given (up to about  1 in 16 adolescents or 1 in 25 adults)  Fever over 102F (about 1 in 100 adolescents or 1 in 250 adults)  Headache (about 3 in 20 adolescents or 1 in 10 adults)  Nausea, vomiting, diarrhea, stomach ache (up to 1 or 3 people in 100)  Swelling of the entire arm where the shot was given (up to about 3 in 100). Severe problems following Tdap (Unable to perform usual activities; required medical attention)  Swelling, severe pain, bleeding and redness in the arm where the shot was given (rare). A severe allergic reaction could occur after any vaccine (estimated less than 1 in a million doses). 5. What if there is a serious reaction? What should I look for?  Look for anything that concerns you, such as signs of a severe allergic reaction, very high fever, or behavior changes. Signs of a severe allergic reaction can include hives, swelling of the  face and throat, difficulty breathing, a fast heartbeat, dizziness, and weakness. These would start a few minutes to a few hours after the vaccination. What should I do?  If you think it is a severe allergic reaction or other emergency that can't wait, call 9-1-1 or get the person to the nearest hospital. Otherwise, call your doctor.  Afterward, the reaction should be reported to the "Vaccine Adverse Event Reporting System" (VAERS). Your doctor might file this report, or you can do it yourself through the VAERS web site at www.vaers.SamedayNews.es, or by calling 503-759-1561. VAERS is only for reporting reactions. They do not give medical advice.  6. The National Vaccine Injury Compensation Program The [employer] Vaccine Injury Compensation Program (VICP) is a federal program that was created to compensate people who may have been injured by certain vaccines. Persons who believe they may have been injured by a vaccine can learn about the program and about filing a claim by calling 458-653-9243 or visiting the [place] website at  GoldCloset.com.ee. 7. How can I learn more?  Ask your doctor.  Call your local or state health department.  Contact the Centers for Disease Control and Prevention (CDC):  Call (220) 108-6764 or visit CDC's website at http://hunter.com/. CDC Tdap Vaccine VIS (07/16/11) Document Released: 08/25/2011 Document Revised: 07/10/2013 Document Reviewed: 06/07/2013 ExitCare Patient Information 2015 [place], [place]. This information is not intended to replace advice given to you by your health care provider. Make sure you discuss any questions you have with your health care provider.

## 2013-12-22 ENCOUNTER — Encounter: Payer: Medicare Other | Admitting: Internal Medicine

## 2013-12-22 LAB — MICROALBUMIN / CREATININE URINE RATIO
Creatinine, Urine: 17.8 mg/dL
Microalb, Ur: <0.2 mg/dL (ref <2.0)

## 2013-12-22 NOTE — Progress Notes (Signed)
Case discussed with Dr. McLean soon after the resident saw the patient.  We reviewed the resident's history and exam and pertinent patient test results.  I agree with the assessment, diagnosis and plan of care documented in the resident's note. 

## 2013-12-28 ENCOUNTER — Ambulatory Visit: Payer: Medicare Other | Admitting: Internal Medicine

## 2013-12-28 ENCOUNTER — Other Ambulatory Visit: Payer: Self-pay | Admitting: Internal Medicine

## 2014-01-04 ENCOUNTER — Ambulatory Visit (INDEPENDENT_AMBULATORY_CARE_PROVIDER_SITE_OTHER): Payer: Medicare Other | Admitting: *Deleted

## 2014-01-04 DIAGNOSIS — G459 Transient cerebral ischemic attack, unspecified: Secondary | ICD-10-CM

## 2014-01-04 DIAGNOSIS — Z5181 Encounter for therapeutic drug level monitoring: Secondary | ICD-10-CM

## 2014-01-04 DIAGNOSIS — Z7901 Long term (current) use of anticoagulants: Secondary | ICD-10-CM

## 2014-01-04 DIAGNOSIS — I4891 Unspecified atrial fibrillation: Secondary | ICD-10-CM | POA: Diagnosis not present

## 2014-01-04 LAB — POCT INR: INR: 2.6

## 2014-01-25 ENCOUNTER — Other Ambulatory Visit: Payer: Self-pay | Admitting: Internal Medicine

## 2014-01-25 DIAGNOSIS — Z1231 Encounter for screening mammogram for malignant neoplasm of breast: Secondary | ICD-10-CM

## 2014-01-26 ENCOUNTER — Other Ambulatory Visit: Payer: Self-pay | Admitting: Internal Medicine

## 2014-01-30 DIAGNOSIS — G43719 Chronic migraine without aura, intractable, without status migrainosus: Secondary | ICD-10-CM | POA: Diagnosis not present

## 2014-02-06 ENCOUNTER — Ambulatory Visit (INDEPENDENT_AMBULATORY_CARE_PROVIDER_SITE_OTHER): Payer: Medicare Other | Admitting: *Deleted

## 2014-02-06 DIAGNOSIS — Z5181 Encounter for therapeutic drug level monitoring: Secondary | ICD-10-CM

## 2014-02-06 DIAGNOSIS — Z7901 Long term (current) use of anticoagulants: Secondary | ICD-10-CM

## 2014-02-06 DIAGNOSIS — G459 Transient cerebral ischemic attack, unspecified: Secondary | ICD-10-CM | POA: Diagnosis not present

## 2014-02-06 DIAGNOSIS — I4891 Unspecified atrial fibrillation: Secondary | ICD-10-CM

## 2014-02-06 LAB — POCT INR: INR: 3.1

## 2014-02-19 ENCOUNTER — Ambulatory Visit (HOSPITAL_COMMUNITY)
Admission: RE | Admit: 2014-02-19 | Discharge: 2014-02-19 | Disposition: A | Discharge status: Home / Self Care | Payer: Medicare Other | Source: Ambulatory Visit | Attending: Internal Medicine | Admitting: Internal Medicine

## 2014-02-19 ENCOUNTER — Other Ambulatory Visit: Payer: Self-pay | Admitting: Internal Medicine

## 2014-02-19 DIAGNOSIS — Z1231 Encounter for screening mammogram for malignant neoplasm of breast: Secondary | ICD-10-CM | POA: Diagnosis not present

## 2014-03-05 ENCOUNTER — Other Ambulatory Visit: Payer: Self-pay | Admitting: *Deleted

## 2014-03-05 DIAGNOSIS — R05 Cough: Secondary | ICD-10-CM

## 2014-03-05 DIAGNOSIS — E038 Other specified hypothyroidism: Secondary | ICD-10-CM

## 2014-03-05 DIAGNOSIS — R519 Headache, unspecified: Secondary | ICD-10-CM

## 2014-03-05 DIAGNOSIS — R51 Headache: Secondary | ICD-10-CM

## 2014-03-05 DIAGNOSIS — K219 Gastro-esophageal reflux disease without esophagitis: Secondary | ICD-10-CM

## 2014-03-05 DIAGNOSIS — F329 Major depressive disorder, single episode, unspecified: Secondary | ICD-10-CM

## 2014-03-05 DIAGNOSIS — R059 Cough, unspecified: Secondary | ICD-10-CM

## 2014-03-05 DIAGNOSIS — I10 Essential (primary) hypertension: Secondary | ICD-10-CM

## 2014-03-05 DIAGNOSIS — I482 Chronic atrial fibrillation, unspecified: Secondary | ICD-10-CM

## 2014-03-05 DIAGNOSIS — F32A Depression, unspecified: Secondary | ICD-10-CM

## 2014-03-05 DIAGNOSIS — E785 Hyperlipidemia, unspecified: Secondary | ICD-10-CM

## 2014-03-05 NOTE — Telephone Encounter (Signed)
Pt wants meds ordered on 12/30.  Will enter then.

## 2014-03-06 ENCOUNTER — Ambulatory Visit (INDEPENDENT_AMBULATORY_CARE_PROVIDER_SITE_OTHER): Payer: Medicare Other | Admitting: *Deleted

## 2014-03-06 DIAGNOSIS — G459 Transient cerebral ischemic attack, unspecified: Secondary | ICD-10-CM

## 2014-03-06 DIAGNOSIS — Z7901 Long term (current) use of anticoagulants: Secondary | ICD-10-CM | POA: Diagnosis not present

## 2014-03-06 DIAGNOSIS — Z5181 Encounter for therapeutic drug level monitoring: Secondary | ICD-10-CM

## 2014-03-06 DIAGNOSIS — I4891 Unspecified atrial fibrillation: Secondary | ICD-10-CM

## 2014-03-06 LAB — POCT INR: INR: 2.6

## 2014-03-07 ENCOUNTER — Other Ambulatory Visit: Payer: Self-pay | Admitting: *Deleted

## 2014-03-07 DIAGNOSIS — I482 Chronic atrial fibrillation, unspecified: Secondary | ICD-10-CM

## 2014-03-07 DIAGNOSIS — K219 Gastro-esophageal reflux disease without esophagitis: Secondary | ICD-10-CM

## 2014-03-07 DIAGNOSIS — E038 Other specified hypothyroidism: Secondary | ICD-10-CM

## 2014-03-07 DIAGNOSIS — R519 Headache, unspecified: Secondary | ICD-10-CM

## 2014-03-07 DIAGNOSIS — F329 Major depressive disorder, single episode, unspecified: Secondary | ICD-10-CM

## 2014-03-07 DIAGNOSIS — R51 Headache: Secondary | ICD-10-CM

## 2014-03-07 DIAGNOSIS — E785 Hyperlipidemia, unspecified: Secondary | ICD-10-CM

## 2014-03-07 DIAGNOSIS — R05 Cough: Secondary | ICD-10-CM

## 2014-03-07 DIAGNOSIS — F32A Depression, unspecified: Secondary | ICD-10-CM

## 2014-03-07 DIAGNOSIS — R059 Cough, unspecified: Secondary | ICD-10-CM

## 2014-03-07 DIAGNOSIS — I10 Essential (primary) hypertension: Secondary | ICD-10-CM

## 2014-03-07 MED ORDER — OMEPRAZOLE 20 MG PO CPDR: 20.0000 mg | DELAYED_RELEASE_CAPSULE | Freq: Every day | ORAL | Status: DC

## 2014-03-07 MED ORDER — WARFARIN SODIUM 5 MG PO TABS: 2.5000 mg | ORAL_TABLET | Freq: Every day | ORAL | Status: DC

## 2014-03-07 MED ORDER — PRAVASTATIN SODIUM 40 MG PO TABS: 40.0000 mg | ORAL_TABLET | Freq: Every day | ORAL | Status: DC

## 2014-03-07 MED ORDER — TIZANIDINE HCL 4 MG PO TABS: 4.0000 mg | ORAL_TABLET | Freq: Two times a day (BID) | ORAL | Status: DC | PRN

## 2014-03-07 MED ORDER — LOSARTAN POTASSIUM 25 MG PO TABS: 12.5000 mg | ORAL_TABLET | Freq: Every day | ORAL | Status: DC

## 2014-03-07 MED ORDER — ATENOLOL 25 MG PO TABS: 37.5000 mg | ORAL_TABLET | Freq: Two times a day (BID) | ORAL | Status: DC

## 2014-03-07 MED ORDER — LEVOTHYROXINE SODIUM 50 MCG PO TABS: 50.0000 ug | ORAL_TABLET | Freq: Every morning | ORAL | Status: DC

## 2014-03-07 MED ORDER — FLUOXETINE HCL 20 MG PO TABS: 20.0000 mg | ORAL_TABLET | Freq: Every day | ORAL | Status: DC

## 2014-03-07 MED ORDER — DILTIAZEM HCL ER COATED BEADS 180 MG PO CP24: 180.0000 mg | ORAL_CAPSULE | Freq: Every day | ORAL | Status: DC

## 2014-03-07 MED ORDER — FUROSEMIDE 20 MG PO TABS: 20.0000 mg | ORAL_TABLET | Freq: Every day | ORAL | Status: DC

## 2014-03-07 MED ORDER — METFORMIN HCL 1000 MG PO TABS: ORAL_TABLET | ORAL | Status: DC

## 2014-03-07 MED ORDER — POTASSIUM CHLORIDE ER 10 MEQ PO TBCR: 10.0000 meq | EXTENDED_RELEASE_TABLET | Freq: Every day | ORAL | Status: DC

## 2014-03-07 NOTE — Telephone Encounter (Signed)
Pt also asking for refill on Vicodin 5/325 mg

## 2014-03-14 ENCOUNTER — Encounter: Payer: Self-pay | Admitting: Cardiology

## 2014-03-15 ENCOUNTER — Other Ambulatory Visit: Payer: Self-pay | Admitting: *Deleted

## 2014-03-15 ENCOUNTER — Encounter: Payer: Self-pay | Admitting: Cardiology

## 2014-03-15 ENCOUNTER — Telehealth: Payer: Self-pay | Admitting: *Deleted

## 2014-03-15 ENCOUNTER — Other Ambulatory Visit: Payer: Self-pay | Admitting: Internal Medicine

## 2014-03-15 DIAGNOSIS — R05 Cough: Secondary | ICD-10-CM

## 2014-03-15 DIAGNOSIS — R059 Cough, unspecified: Secondary | ICD-10-CM

## 2014-03-15 DIAGNOSIS — I482 Chronic atrial fibrillation, unspecified: Secondary | ICD-10-CM

## 2014-03-15 DIAGNOSIS — R519 Headache, unspecified: Secondary | ICD-10-CM

## 2014-03-15 DIAGNOSIS — Z8679 Personal history of other diseases of the circulatory system: Secondary | ICD-10-CM

## 2014-03-15 DIAGNOSIS — E038 Other specified hypothyroidism: Secondary | ICD-10-CM

## 2014-03-15 DIAGNOSIS — I10 Essential (primary) hypertension: Secondary | ICD-10-CM

## 2014-03-15 DIAGNOSIS — K219 Gastro-esophageal reflux disease without esophagitis: Secondary | ICD-10-CM

## 2014-03-15 DIAGNOSIS — F32A Depression, unspecified: Secondary | ICD-10-CM

## 2014-03-15 DIAGNOSIS — E785 Hyperlipidemia, unspecified: Secondary | ICD-10-CM

## 2014-03-15 DIAGNOSIS — R51 Headache: Secondary | ICD-10-CM

## 2014-03-15 DIAGNOSIS — F329 Major depressive disorder, single episode, unspecified: Secondary | ICD-10-CM

## 2014-03-15 MED ORDER — TIZANIDINE HCL 4 MG PO TABS: 4.0000 mg | ORAL_TABLET | Freq: Two times a day (BID) | ORAL | Status: DC | PRN

## 2014-03-15 MED ORDER — LOSARTAN POTASSIUM 25 MG PO TABS: 12.5000 mg | ORAL_TABLET | Freq: Every day | ORAL | Status: DC

## 2014-03-15 MED ORDER — POTASSIUM CHLORIDE ER 10 MEQ PO TBCR: 10.0000 meq | EXTENDED_RELEASE_TABLET | Freq: Every day | ORAL | Status: DC

## 2014-03-15 MED ORDER — FLUOXETINE HCL 20 MG PO TABS: 20.0000 mg | ORAL_TABLET | Freq: Every day | ORAL | Status: DC

## 2014-03-15 MED ORDER — PRAVASTATIN SODIUM 40 MG PO TABS: 40.0000 mg | ORAL_TABLET | Freq: Every day | ORAL | Status: DC

## 2014-03-15 MED ORDER — OMEPRAZOLE 20 MG PO CPDR: 20.0000 mg | DELAYED_RELEASE_CAPSULE | Freq: Every day | ORAL | Status: DC

## 2014-03-15 MED ORDER — METFORMIN HCL 1000 MG PO TABS: ORAL_TABLET | ORAL | Status: DC

## 2014-03-15 MED ORDER — ACCU-CHEK SMARTVIEW CONTROL VI LIQD: 1.0000 | Status: DC | PRN

## 2014-03-15 MED ORDER — FUROSEMIDE 20 MG PO TABS: 20.0000 mg | ORAL_TABLET | Freq: Every day | ORAL | Status: DC

## 2014-03-15 MED ORDER — DILTIAZEM HCL ER COATED BEADS 180 MG PO CP24: 180.0000 mg | ORAL_CAPSULE | Freq: Every day | ORAL | Status: DC

## 2014-03-15 MED ORDER — LEVOTHYROXINE SODIUM 50 MCG PO TABS: 50.0000 ug | ORAL_TABLET | Freq: Every morning | ORAL | Status: DC

## 2014-03-15 MED ORDER — ATENOLOL 25 MG PO TABS: 37.5000 mg | ORAL_TABLET | Freq: Two times a day (BID) | ORAL | Status: DC

## 2014-03-15 NOTE — Telephone Encounter (Signed)
Pt has changed pharmacies due to new medicare, please approve these, must be 90 day supplies, she will be out of everything next week and this will take 7 to 10 days if sent today

## 2014-03-15 NOTE — Telephone Encounter (Signed)
Pt calls and states she had called for refills to be sent to her new pharmacy that her insurance states she must use for financial reasons, Mcarthur Rossetti and all her scripts were sent to her old drug store. Will attempt to have these reprinted today and fax them to her pharm. She also states she ask for new referrals for the [place] [facility] and the headache clinic due to her insurance, will check with [email or web address] ad. On how to proceed with this

## 2014-03-19 ENCOUNTER — Other Ambulatory Visit: Payer: Self-pay | Admitting: Internal Medicine

## 2014-03-19 DIAGNOSIS — R519 Headache, unspecified: Secondary | ICD-10-CM

## 2014-03-19 DIAGNOSIS — R51 Headache: Principal | ICD-10-CM

## 2014-03-20 ENCOUNTER — Other Ambulatory Visit: Payer: Self-pay | Admitting: Internal Medicine

## 2014-03-20 ENCOUNTER — Telehealth: Payer: Self-pay | Admitting: *Deleted

## 2014-03-20 MED ORDER — GLUCOSE BLOOD VI STRP: ORAL_STRIP | Status: DC

## 2014-03-20 MED ORDER — LANCETS 30G MISC: Status: DC

## 2014-03-20 NOTE — Telephone Encounter (Signed)
[facility] pharmacy is requesting 90 day supply on Accu-chek smartview test strips, Accu-chek fastclix lancets and BD single use swab. Hilda Blades Deigo Alonso RN 03/20/14 12N

## 2014-03-22 ENCOUNTER — Other Ambulatory Visit: Payer: Self-pay | Admitting: Internal Medicine

## 2014-03-22 DIAGNOSIS — I482 Chronic atrial fibrillation, unspecified: Secondary | ICD-10-CM

## 2014-03-23 ENCOUNTER — Other Ambulatory Visit: Payer: Self-pay | Admitting: *Deleted

## 2014-03-23 DIAGNOSIS — F329 Major depressive disorder, single episode, unspecified: Secondary | ICD-10-CM

## 2014-03-23 DIAGNOSIS — F32A Depression, unspecified: Secondary | ICD-10-CM

## 2014-03-23 NOTE — Telephone Encounter (Signed)
Pt called and stated her new insurance co pay for fluoxetine is 138.00, she can not afford this she will soon be out, is there something else she can be prescribed? Please advise

## 2014-03-27 ENCOUNTER — Other Ambulatory Visit: Payer: Self-pay | Admitting: Internal Medicine

## 2014-03-27 MED ORDER — FLUOXETINE HCL 20 MG PO CAPS: 20.0000 mg | ORAL_CAPSULE | Freq: Every day | ORAL | Status: DC

## 2014-03-27 NOTE — Telephone Encounter (Signed)
Pt states that the new insurance/ pharmacy will fill the fluoxetine in capsule form for free, tablets are 130.00

## 2014-04-02 ENCOUNTER — Ambulatory Visit: Payer: Medicare Other

## 2014-04-05 NOTE — Addendum Note (Signed)
Addended by: Hulan Fray on: 04/05/2014 08:33 PM   Modules accepted: Orders

## 2014-04-08 ENCOUNTER — Encounter (HOSPITAL_COMMUNITY): Payer: Self-pay | Admitting: Cardiology

## 2014-04-08 ENCOUNTER — Emergency Department (HOSPITAL_COMMUNITY): Payer: Commercial Managed Care - HMO

## 2014-04-08 ENCOUNTER — Emergency Department (HOSPITAL_COMMUNITY)
Admission: EM | Admit: 2014-04-08 | Discharge: 2014-04-08 | Disposition: A | Discharge status: Home / Self Care | Payer: Commercial Managed Care - HMO | Attending: Emergency Medicine | Admitting: Emergency Medicine

## 2014-04-08 DIAGNOSIS — Z8739 Personal history of other diseases of the musculoskeletal system and connective tissue: Secondary | ICD-10-CM | POA: Diagnosis not present

## 2014-04-08 DIAGNOSIS — E039 Hypothyroidism, unspecified: Secondary | ICD-10-CM | POA: Insufficient documentation

## 2014-04-08 DIAGNOSIS — Z8669 Personal history of other diseases of the nervous system and sense organs: Secondary | ICD-10-CM | POA: Diagnosis not present

## 2014-04-08 DIAGNOSIS — E669 Obesity, unspecified: Secondary | ICD-10-CM | POA: Insufficient documentation

## 2014-04-08 DIAGNOSIS — E119 Type 2 diabetes mellitus without complications: Secondary | ICD-10-CM | POA: Diagnosis not present

## 2014-04-08 DIAGNOSIS — Z880 Allergy status to penicillin: Secondary | ICD-10-CM | POA: Insufficient documentation

## 2014-04-08 DIAGNOSIS — R1032 Left lower quadrant pain: Secondary | ICD-10-CM

## 2014-04-08 DIAGNOSIS — K219 Gastro-esophageal reflux disease without esophagitis: Secondary | ICD-10-CM | POA: Insufficient documentation

## 2014-04-08 DIAGNOSIS — I509 Heart failure, unspecified: Secondary | ICD-10-CM | POA: Diagnosis not present

## 2014-04-08 DIAGNOSIS — Z91040 Latex allergy status: Secondary | ICD-10-CM | POA: Diagnosis not present

## 2014-04-08 DIAGNOSIS — I10 Essential (primary) hypertension: Secondary | ICD-10-CM | POA: Insufficient documentation

## 2014-04-08 DIAGNOSIS — F329 Major depressive disorder, single episode, unspecified: Secondary | ICD-10-CM | POA: Insufficient documentation

## 2014-04-08 DIAGNOSIS — Z79899 Other long term (current) drug therapy: Secondary | ICD-10-CM | POA: Diagnosis not present

## 2014-04-08 DIAGNOSIS — E785 Hyperlipidemia, unspecified: Secondary | ICD-10-CM | POA: Diagnosis not present

## 2014-04-08 DIAGNOSIS — K5732 Diverticulitis of large intestine without perforation or abscess without bleeding: Secondary | ICD-10-CM

## 2014-04-08 DIAGNOSIS — G8929 Other chronic pain: Secondary | ICD-10-CM

## 2014-04-08 DIAGNOSIS — Z8673 Personal history of transient ischemic attack (TIA), and cerebral infarction without residual deficits: Secondary | ICD-10-CM | POA: Insufficient documentation

## 2014-04-08 DIAGNOSIS — R109 Unspecified abdominal pain: Secondary | ICD-10-CM | POA: Diagnosis present

## 2014-04-08 DIAGNOSIS — Z7901 Long term (current) use of anticoagulants: Secondary | ICD-10-CM | POA: Diagnosis not present

## 2014-04-08 DIAGNOSIS — R1031 Right lower quadrant pain: Secondary | ICD-10-CM

## 2014-04-08 DIAGNOSIS — K760 Fatty (change of) liver, not elsewhere classified: Secondary | ICD-10-CM | POA: Diagnosis not present

## 2014-04-08 LAB — URINALYSIS, ROUTINE W REFLEX MICROSCOPIC
Bilirubin Urine: NEGATIVE
Glucose, UA: NEGATIVE mg/dL
Hgb urine dipstick: NEGATIVE
Ketones, ur: NEGATIVE mg/dL
Leukocytes, UA: NEGATIVE
Nitrite: NEGATIVE
Protein, ur: NEGATIVE mg/dL
Specific Gravity, Urine: >1.046 — ABNORMAL HIGH (ref 1.005–1.030)
Urobilinogen, UA: 0.2 mg/dL (ref 0.0–1.0)
pH: 5 (ref 5.0–8.0)

## 2014-04-08 LAB — CBC WITH DIFFERENTIAL/PLATELET
BASOS PCT: 0 % (ref 0–1)
Basophils Absolute: 0 10*3/uL (ref 0.0–0.1)
EOS ABS: 0.1 10*3/uL (ref 0.0–0.7)
Eosinophils Relative: 1 % (ref 0–5)
HEMATOCRIT: 40.1 % (ref 36.0–46.0)
Hemoglobin: 13.3 g/dL (ref 12.0–15.0)
Lymphocytes Relative: 27 % (ref 12–46)
Lymphs Abs: 2.1 10*3/uL (ref 0.7–4.0)
MCH: 29.3 pg (ref 26.0–34.0)
MCHC: 33.2 g/dL (ref 30.0–36.0)
MCV: 88.3 fL (ref 78.0–100.0)
Monocytes Absolute: 0.5 10*3/uL (ref 0.1–1.0)
Monocytes Relative: 6 % (ref 3–12)
NEUTROS PCT: 66 % (ref 43–77)
Neutro Abs: 5 10*3/uL (ref 1.7–7.7)
Platelets: 263 10*3/uL (ref 150–400)
RBC: 4.54 MIL/uL (ref 3.87–5.11)
RDW: 13.3 % (ref 11.5–15.5)
WBC: 7.6 10*3/uL (ref 4.0–10.5)

## 2014-04-08 LAB — COMPREHENSIVE METABOLIC PANEL
ALT: 17 U/L (ref 0–35)
ANION GAP: 12 (ref 5–15)
AST: 29 U/L (ref 0–37)
Albumin: 3.6 g/dL (ref 3.5–5.2)
Alkaline Phosphatase: 77 U/L (ref 39–117)
BUN: 12 mg/dL (ref 6–23)
CO2: 23 mmol/L (ref 19–32)
Calcium: 9.7 mg/dL (ref 8.4–10.5)
Chloride: 105 mmol/L (ref 96–112)
Creatinine, Ser: 0.99 mg/dL (ref 0.50–1.10)
GFR calc Af Amer: 68 mL/min — ABNORMAL LOW (ref >90)
GFR calc non Af Amer: 59 mL/min — ABNORMAL LOW (ref >90)
Glucose, Bld: 143 mg/dL — ABNORMAL HIGH (ref 70–99)
Potassium: 3.8 mmol/L (ref 3.5–5.1)
Sodium: 140 mmol/L (ref 135–145)
Total Bilirubin: 1.8 mg/dL — ABNORMAL HIGH (ref 0.3–1.2)
Total Protein: 7.1 g/dL (ref 6.0–8.3)

## 2014-04-08 LAB — LIPASE, BLOOD: Lipase: 36 U/L (ref 11–59)

## 2014-04-08 LAB — CBG MONITORING, ED: Glucose-Capillary: 86 mg/dL (ref 70–99)

## 2014-04-08 MED ORDER — IOHEXOL 300 MG/ML  SOLN
100.0000 mL | Freq: Once | INTRAMUSCULAR | Status: AC | PRN
  Administered 2014-04-08: 100 mL via INTRAVENOUS

## 2014-04-08 MED ORDER — FENTANYL CITRATE 0.05 MG/ML IJ SOLN
50.0000 ug | Freq: Once | INTRAMUSCULAR | Status: AC
  Administered 2014-04-08: 50 ug via INTRAVENOUS
  Filled 2014-04-08: qty 2

## 2014-04-08 MED ORDER — ONDANSETRON HCL 4 MG/2ML IJ SOLN
4.0000 mg | Freq: Once | INTRAMUSCULAR | Status: AC
  Administered 2014-04-08: 4 mg via INTRAVENOUS
  Filled 2014-04-08: qty 2

## 2014-04-08 MED ORDER — TRAMADOL HCL 50 MG PO TABS: 50.0000 mg | ORAL_TABLET | Freq: Four times a day (QID) | ORAL | Status: DC | PRN

## 2014-04-08 MED ORDER — METRONIDAZOLE 500 MG PO TABS
500.0000 mg | ORAL_TABLET | Freq: Two times a day (BID) | ORAL | Status: DC
Start: 2014-04-08 — End: 2014-04-16

## 2014-04-08 MED ORDER — CIPROFLOXACIN IN D5W 400 MG/200ML IV SOLN
400.0000 mg | Freq: Once | INTRAVENOUS | Status: AC
  Administered 2014-04-08: 400 mg via INTRAVENOUS
  Filled 2014-04-08: qty 200

## 2014-04-08 MED ORDER — METRONIDAZOLE IN NACL 5-0.79 MG/ML-% IV SOLN
500.0000 mg | Freq: Once | INTRAVENOUS | Status: AC
  Administered 2014-04-08: 500 mg via INTRAVENOUS
  Filled 2014-04-08: qty 100

## 2014-04-08 MED ORDER — CIPROFLOXACIN HCL 500 MG PO TABS: 500.0000 mg | ORAL_TABLET | Freq: Two times a day (BID) | ORAL | Status: DC

## 2014-04-08 MED ORDER — IOHEXOL 300 MG/ML  SOLN
25.0000 mL | Freq: Once | INTRAMUSCULAR | Status: AC | PRN
  Administered 2014-04-08: 25 mL via ORAL

## 2014-04-08 NOTE — ED Notes (Signed)
Pt completed contrast CT notified.

## 2014-04-08 NOTE — ED Notes (Signed)
Pt reports lower quadrant abd pain for the past couple of days. States she has had some nausea. Reports some burning with urination.

## 2014-04-08 NOTE — ED Notes (Signed)
Notified RN of CBG 86

## 2014-04-08 NOTE — ED Provider Notes (Signed)
CSN: [id number]     Arrival date & time 04/08/14  1003 History   First MD Initiated Contact with Patient 04/08/14 1014     Chief Complaint  Patient presents with  . Abdominal Pain     HPI  Patient presents with concern of abdominal pain, nausea, anorexia. Symptoms began 3 days ago, without clear precipitant.  Since onset symptoms of been persistent.  Patient has not taken any medicine for pain relief. Today, with a pain exacerbation she felt lightheaded, but did not have syncope. Patient always has diarrhea.   Past Medical History  Diagnosis Date  . Depression   . Obesity   . Skin neoplasm   . Bunion   . Carotid stenosis   . Fibromyalgia   . Internal hemorrhoid   . GERD (gastroesophageal reflux disease)   . Chronic gastritis   . Transaminase or LDH elevation   . Mild cognitive impairment   . Hyperlipidemia   . Fatty liver   . Lumbar back pain   . Diabetic peripheral neuropathy   . Dysphagia     no documented strictures but responded positively to dilation in past.   . Hypertension   . Cholecystitis     s/p cholecystectomy  . Sarcoidosis   . Fibromyalgia   . CHF (congestive heart failure)   . Skin cancer 1990's    "front of my right leg"  . Atrial fibrillation   . OSA (obstructive sleep apnea) 2003    "can't sleep w/that darm machine"  . Exertional dyspnea   . Hypothyroidism   . Type II diabetes mellitus   . TIA (transient ischemic attack)     07/29/11 pt denies this history  . H/O hiatal hernia   . Epileptic seizure, tonic     .No meds since age of 51.  . Osteoarthritis   . Stroke     tia  . Hypothyroid   . Diverticulosis   . Digoxin toxicity     08/2011  . Bradycardia, drug induced     08/2011 (dig)  . Drug-induced hypotension     08/2011 (Dig)  . Carpal tunnel syndrome     mild   . Dysrhythmia     HX OF ATRIAL FIBRILATION   Past Surgical History  Procedure Laterality Date  . Hiatal hernia repair  1990    "had to have scar tissue removed 6 months  after repair"  . Esophageal dilation    . Cataract extraction w/phaco  10/27/2010    Procedure: CATARACT EXTRACTION PHACO AND INTRAOCULAR LENS PLACEMENT (IOC);  Surgeon: Williams Che;  Location: [facility];  Service: Ophthalmology;  Laterality: Left;  CDE- 1.78  . Cataract extraction w/phaco  07/13/2011    Procedure: CATARACT EXTRACTION PHACO AND INTRAOCULAR LENS PLACEMENT (IOC);  Surgeon: Williams Che, MD;  Location: [facility];  Service: Ophthalmology;  Laterality: Right;  CDE:  1.65  . Liposuction  1992  . Inverted nipples  1992  . Breast surgery  ~ 2010    excision milk duct; right breast  . Foot surgery  ~ 2011    ~straightened toe left foot & scraped bone below big toe"  . Foot surgery  ~ 2011    "scraped bone of big toe; shortened middle toe; right foot"  . Cholecystectomy  1980's  . Dilation and curettage of uterus  1970; 1976  . Abdominal hysterectomy  1980's    partial  . Tubal ligation  1976  . Bilateral oophorectomy  1980's    "  after partial hysterectomy"  . Skin cancer excision  1990's    "front side of right shin"  . Ventral hernia repair  07/29/2011    Procedure: LAPAROSCOPIC VENTRAL HERNIA;  Surgeon: Adin Hector, MD;  Location: [facility];  Service: General;  Laterality: N/A;  multiple incarcerated hernias with mesh  . Other surgical history      mutiple ab surgeries for abdominal hernia  . Other surgical history      multiple dilation of esophagus  . Other surgical history      right lens implant 07/2011, left lens implant 10/2010  . Other surgical history      right breast bx=benign   Family History  Problem Relation Age of Onset  . Crohn's disease Mother   . Colitis Mother     Crohns  . Anesthesia problems Mother   . Arthritis Mother     rheumatoid; maternal side of family also with RA  . Cancer Father     skin  . Diabetes Father   . Heart disease Father   . Melanoma Father   . Coronary artery disease Father   . Diabetes Sister   . Depression Maternal  Uncle   . Dementia Maternal Uncle   . Hypotension Neg Hx   . Malignant hyperthermia Neg Hx   . Pseudochol deficiency Neg Hx   . Colon cancer Paternal Uncle     ? if stomach or colon    History  Substance Use Topics  . Smoking status: Never Smoker   . Smokeless tobacco: Never Used  . Alcohol Use: No   OB History    No data available     Review of Systems  Constitutional:       Per HPI, otherwise negative  HENT:       Per HPI, otherwise negative  Respiratory:       Per HPI, otherwise negative  Cardiovascular:       Per HPI, otherwise negative  Gastrointestinal: Positive for nausea and abdominal pain. Negative for vomiting.  Endocrine:       Negative aside from HPI  Genitourinary: Positive for dysuria.  Musculoskeletal:       Per HPI, otherwise negative  Skin: Negative.   Neurological: Positive for light-headedness. Negative for syncope.      Allergies  Gemfibrozil; Latex; Penicillins; Adhesive; Codeine; Ace inhibitors; and Digoxin and related  Home Medications   Prior to Admission medications   Medication Sig Start Date End Date Taking? Authorizing Provider  atenolol (TENORMIN) 25 MG tablet TAKE 1 AND 1/2 TABLET TWICE A DAY 02/19/14   Cresenciano Genre, MD  atenolol (TENORMIN) 25 MG tablet Take 1.5 tablets (37.5 mg total) by mouth 2 (two) times daily. 03/15/14   Cresenciano Genre, MD  Blood Glucose Calibration (ACCU-CHEK SMARTVIEW CONTROL) LIQD 1 Device by In Vitro route as needed. Use to perform control/ calibration on accu-chek meter. diag code E11.9. Non insulin dependent 03/15/14   Cresenciano Genre, MD  diltiazem (CARDIZEM CD) 180 MG 24 hr capsule Take 1 capsule (180 mg total) by mouth daily. 03/15/14   Cresenciano Genre, MD  enoxaparin (LOVENOX) 150 MG/ML injection Inject 0.87 mLs (130 mg total) into the skin daily. 12/01/13   Minus Breeding, MD  FLUoxetine (PROZAC) 20 MG capsule Take 1 capsule (20 mg total) by mouth daily. 03/27/14 03/27/15  Cresenciano Genre, MD  furosemide (LASIX)  20 MG tablet Take 1 tablet (20 mg total) by mouth daily. 03/15/14   Olivia Mackie  Starr Sinclair, MD  glucose blood test strip Use as instructed 03/20/14   Cresenciano Genre, MD  Lancets 30G MISC DX Code: E11.9 Check blood sugars one time per day. Accucheck fastclix lancets 03/20/14   Cresenciano Genre, MD  levothyroxine (SYNTHROID, LEVOTHROID) 50 MCG tablet Take 1 tablet (50 mcg total) by mouth every morning. 03/15/14   Cresenciano Genre, MD  losartan (COZAAR) 25 MG tablet Take 0.5 tablets (12.5 mg total) by mouth daily. 03/15/14   Cresenciano Genre, MD  metFORMIN (GLUCOPHAGE) 1000 MG tablet TAKE ONE TABLET TWICE A DAY WITH FOOD 03/15/14   Cresenciano Genre, MD  omeprazole (PRILOSEC) 20 MG capsule Take 1 capsule (20 mg total) by mouth daily. 03/15/14   Cresenciano Genre, MD  potassium chloride (K-DUR) 10 MEQ tablet Take 1 tablet (10 mEq total) by mouth daily. 03/15/14   Cresenciano Genre, MD  pravastatin (PRAVACHOL) 40 MG tablet Take 1 tablet (40 mg total) by mouth daily. 03/15/14   Cresenciano Genre, MD  tiZANidine (ZANAFLEX) 4 MG tablet Take 1 tablet (4 mg total) by mouth every 12 (twelve) hours as needed for muscle spasms. 03/15/14   Cresenciano Genre, MD  warfarin (COUMADIN) 5 MG tablet Take 0.5-1 tablets (2.5-5 mg total) by mouth at bedtime. Take 5mg  on Sunday and Thursday. All other days take 2.5mg . 03/07/14   Annia Belt, MD   BP 117/64 mmHg  Pulse 89  Temp(Src) 97.6 F (36.4 C) (Oral)  Resp 16  SpO2 99%  LMP 06/03/1978 Physical Exam  Constitutional: She is oriented to person, place, and time. She appears well-developed and well-nourished. No distress.  HENT:  Head: Normocephalic and atraumatic.  Eyes: Conjunctivae and EOM are normal.  Cardiovascular: Normal rate and regular rhythm.   Pulmonary/Chest: Effort normal and breath sounds normal. No stridor. No respiratory distress.  Abdominal: She exhibits no distension. There is no hepatosplenomegaly. There is tenderness in the right lower quadrant, suprapubic area and left lower  quadrant. There is guarding. There is no rigidity.  Musculoskeletal: She exhibits no edema.  Neurological: She is alert and oriented to person, place, and time. No cranial nerve deficit.  Skin: Skin is warm and dry.  Psychiatric: She has a normal mood and affect.  Nursing note and vitals reviewed.   ED Course  Procedures (including critical care time) Labs Review Labs Reviewed  COMPREHENSIVE METABOLIC PANEL - Abnormal; Notable for the following:    Glucose, Bld 143 (*)    Total Bilirubin 1.8 (*)    GFR calc non Af Amer 59 (*)    GFR calc Af Amer 68 (*)    All other components within normal limits  CBC WITH DIFFERENTIAL/PLATELET  LIPASE, BLOOD  URINALYSIS, ROUTINE W REFLEX MICROSCOPIC    Imaging Review Ct Abdomen Pelvis W Contrast  04/08/2014   CLINICAL DATA:  Low abdominal to pelvic pain for a couple of days with some nausea Hx of Diverticulosis Hx of hypothyroidism  Hx of skin ca  EXAM: CT ABDOMEN AND PELVIS WITH CONTRAST  TECHNIQUE: Multidetector CT imaging of the abdomen and pelvis was performed using the standard protocol following bolus administration of intravenous contrast.  CONTRAST:  139mL OMNIPAQUE IOHEXOL 300 MG/ML  SOLN  COMPARISON:  08/18/2013  FINDINGS: There are inflammatory changes surrounding thick-walled segment of the mid sigmoid colon, extending for approximately 7 cm. There are several diverticula in this location, 1 in particular which appears inflamed. Findings are most consistent with diverticulitis. There is no extraluminal  air. No fluid collection is seen to suggest an abscess.  There other scattered diverticula along the left colon with no other evidence diverticulitis. Remainder of the colon is unremarkable. Normal small bowel. Normal appendix.  4 mm nodule at the base of the oblique fissure on the right, stable from the prior study. Lung bases otherwise essentially clear.  Mild diffuse fatty infiltration of the liver. No liver mass or focal lesion.  Normal  spleen. The gallbladder surgically absent. No bile duct dilation.  Normal pancreas and adrenal glands.  4 mm low-density lesion in the posterior lower pole of the right kidney, likely a cyst. No other renal abnormalities. No hydronephrosis. Normal ureters. Normal bladder.  Uterus surgically absent.  Few prominent lymph nodes along the gastrohepatic ligament, stable and presumed reactive. No other prominent lymph nodes.  No ascites.  No free air.  There are degenerative changes at L4-L5 with a grade 1 anterolisthesis. No osteoblastic or osteolytic lesions.  IMPRESSION: 1. Uncomplicated sigmoid colon diverticulitis. No extraluminal air. No abscess. 2. No other acute findings.   Electronically Signed   By: Lajean Manes M.D.   On: 04/08/2014 13:13    1:47 PM BP 94/54. She states that she feels better. Patient will continue to receive fluid resuscitation, initial antibiotics given her mild hypotension.   3:24 PM Patient continues to feel better. Blood pressure remains consistent, 100/60.   MDM   Patient presents with ongoing abdominal pain, diarrhea is found have diverticulitis. Patient has borderline low blood pressure, but is awake and alert, improved here. No evidence for sepsis, and the patient improved here with fluid resuscitation, tolerated antibiotics well, was discharged in stable condition to follow-up with primary care and gastroenterology.   Carmin Muskrat, MD 04/08/14 [phone]

## 2014-04-08 NOTE — Discharge Instructions (Signed)
Your evaluation today has resulted in a diagnosis of diverticulitis.  It is important that you take all medication as directed, and monitor your condition carefully.  Some of your antibiotics may change the way your body processes Coumadin.  It is important that you follow-up with your primary care physician, in addition to the gastroenterologist to ensure that your INR level remains appropriate.  Return here for concerning changes in your condition.

## 2014-04-09 ENCOUNTER — Ambulatory Visit (INDEPENDENT_AMBULATORY_CARE_PROVIDER_SITE_OTHER): Payer: Commercial Managed Care - HMO | Admitting: Pharmacist

## 2014-04-09 DIAGNOSIS — I4891 Unspecified atrial fibrillation: Secondary | ICD-10-CM

## 2014-04-09 DIAGNOSIS — Z5181 Encounter for therapeutic drug level monitoring: Secondary | ICD-10-CM

## 2014-04-09 LAB — POCT INR: INR: 2.1

## 2014-04-09 MED ORDER — WARFARIN SODIUM 5 MG PO TABS: 2.5000 mg | ORAL_TABLET | Freq: Every day | ORAL | Status: DC

## 2014-04-09 NOTE — Patient Instructions (Signed)
Patient instructed to take medications as defined in the Anti-coagulation Track section of this encounter.  Patient instructed to take today's dose.  Patient verbalized understanding of these instructions.    

## 2014-04-09 NOTE — Progress Notes (Signed)
Anti-Coagulation Progress Note  [name] is a 64 y.o. female who is currently on an anti-coagulation regimen.    RECENT RESULTS: Recent results are below, the most recent result is correlated with a dose of 22.5 mg. per week:  She has started on TWO antibiotics known to have a marked hypoprothrombinemic response (cipro and metronidazole). As such, will dose at 1/2 x 5mg  (2.5mg ) today and tomorrow. Will re-evaluate INR on Wednesday 3-FEB-16 at 1130h.  Lab Results  Component Value Date   INR 2.10 04/09/2014   INR 2.6 03/06/2014   INR 3.1 02/06/2014   PROTIME 18.8 08/20/2008    ANTI-COAG DOSE: Anticoagulation Dose Instructions as of 04/09/2014      [name] Tue Wed Thu Fri Sat   New Dose 0 mg 2.5 mg 2.5 mg 0 mg 0 mg 0 mg 0 mg       ANTICOAG SUMMARY: Anticoagulation Episode Summary    Current INR goal 2.0-3.0  Next INR check 04/11/2014  INR from last check 2.10 (04/09/2014)  Weekly max dose   Target end date   INR check location   Preferred lab   Send INR reminders to [facility]   Indications  TIA [G45.9] Atrial fibrillation [I48.91] Encounter for therapeutic drug monitoring [Z51.81]        Comments Needs Lovenox Bridge      Anticoagulation Care Providers    Provider Role Specialty Phone number   Minus Breeding, MD  Cardiology [phone]      ANTICOAG TODAY: Anticoagulation Summary as of 04/09/2014    INR goal 2.0-3.0  Selected INR 2.10 (04/09/2014)  Next INR check 04/11/2014  Target end date    Indications  TIA [G45.9] Atrial fibrillation [I48.91] Encounter for therapeutic drug monitoring [Z51.81]      Anticoagulation Episode Summary    INR check location    Preferred lab    Send INR reminders to [place]   Comments Needs Lovenox Bridge    Anticoagulation Care Providers    Provider Role Specialty Phone number   Minus Breeding, MD  Cardiology [phone]      PATIENT INSTRUCTIONS: Patient Instructions  Patient  instructed to take medications as defined in the Anti-coagulation Track section of this encounter.  Patient instructed to take today's dose.  Patient verbalized understanding of these instructions.       FOLLOW-UP Return in 2 days (on 04/11/2014) for Follow up INR at 1130h.  Jorene Guest, III Pharm.D., CACP

## 2014-04-10 ENCOUNTER — Other Ambulatory Visit: Payer: Self-pay | Admitting: Internal Medicine

## 2014-04-10 DIAGNOSIS — K5732 Diverticulitis of large intestine without perforation or abscess without bleeding: Secondary | ICD-10-CM

## 2014-04-11 ENCOUNTER — Ambulatory Visit (INDEPENDENT_AMBULATORY_CARE_PROVIDER_SITE_OTHER): Payer: Commercial Managed Care - HMO | Admitting: Pharmacist

## 2014-04-11 DIAGNOSIS — Z7901 Long term (current) use of anticoagulants: Secondary | ICD-10-CM

## 2014-04-11 DIAGNOSIS — Z5181 Encounter for therapeutic drug level monitoring: Secondary | ICD-10-CM

## 2014-04-11 DIAGNOSIS — I4891 Unspecified atrial fibrillation: Secondary | ICD-10-CM

## 2014-04-11 LAB — POCT INR: INR: 2.4

## 2014-04-11 NOTE — Progress Notes (Signed)
Anti-Coagulation Progress Note  [name] is a 64 y.o. female who is currently on an anti-coagulation regimen.    RECENT RESULTS: Recent results are below, the most recent result is correlated with a dose of 22.5 mg. per week: Lab Results  Component Value Date   INR 2.40 04/11/2014   INR 2.10 04/09/2014   INR 2.6 03/06/2014   PROTIME 18.8 08/20/2008    ANTI-COAG DOSE: Anticoagulation Dose Instructions as of 04/11/2014      [name] Tue Wed Thu Fri Sat   New Dose 2.5 mg 2.5 mg 2.5 mg 2.5 mg 2.5 mg 2.5 mg 2.5 mg       ANTICOAG SUMMARY: Anticoagulation Episode Summary    Current INR goal 2.0-3.0  Next INR check 04/16/2014  INR from last check 2.40 (04/11/2014)  Weekly max dose   Target end date   [facility] check location [facility]  Preferred lab   Send INR reminders to    Indications  TIA [G45.9] Atrial fibrillation [I48.91] Encounter for therapeutic drug monitoring [Z51.81]        Comments Needs Lovenox Bridge      Anticoagulation Care Providers    Provider Role Specialty Phone number   Cresenciano Genre, MD Responsible Internal Medicine [phone]      ANTICOAG TODAY: Anticoagulation Summary as of 04/11/2014    INR goal 2.0-3.0  Selected INR 2.40 (04/11/2014)  Next INR check 04/16/2014  Target end date    Indications  TIA [G45.9] Atrial fibrillation [I48.91] Encounter for therapeutic drug monitoring [Z51.81]      Anticoagulation Episode Summary    [facility] check location [facility]   Preferred lab    Send INR reminders to    Comments Needs Lovenox Bridge    Anticoagulation Care Providers    Provider Role Specialty Phone number   Cresenciano Genre, MD Responsible Internal Medicine [phone]      PATIENT INSTRUCTIONS: Patient Instructions  Patient instructed to take medications as defined in the Anti-coagulation Track section of this encounter.  Patient instructed to take today's dose.  Patient verbalized understanding of these instructions.        FOLLOW-UP Return in 5 days (on 04/16/2014) for Follow up INR at 1130h.  Jorene Guest, III Pharm.D., CACP

## 2014-04-11 NOTE — Patient Instructions (Signed)
Patient instructed to take medications as defined in the Anti-coagulation Track section of this encounter.  Patient instructed to take today's dose.  Patient verbalized understanding of these instructions.    

## 2014-04-13 ENCOUNTER — Telehealth: Payer: Self-pay | Admitting: Pharmacist

## 2014-04-13 NOTE — Telephone Encounter (Signed)
Call to patient to confirm appointment for 04/16/14 at 11:30. LMTCB

## 2014-04-16 ENCOUNTER — Ambulatory Visit (INDEPENDENT_AMBULATORY_CARE_PROVIDER_SITE_OTHER): Payer: Commercial Managed Care - HMO | Admitting: Pharmacist

## 2014-04-16 ENCOUNTER — Encounter: Payer: Self-pay | Admitting: Nurse Practitioner

## 2014-04-16 ENCOUNTER — Ambulatory Visit (INDEPENDENT_AMBULATORY_CARE_PROVIDER_SITE_OTHER): Payer: Commercial Managed Care - HMO | Admitting: Nurse Practitioner

## 2014-04-16 VITALS — BP 110/64 | HR 88 | Ht 63.0 in | Wt 188.4 lb

## 2014-04-16 DIAGNOSIS — K5732 Diverticulitis of large intestine without perforation or abscess without bleeding: Secondary | ICD-10-CM

## 2014-04-16 DIAGNOSIS — A498 Other bacterial infections of unspecified site: Secondary | ICD-10-CM | POA: Insufficient documentation

## 2014-04-16 DIAGNOSIS — Z5181 Encounter for therapeutic drug level monitoring: Secondary | ICD-10-CM

## 2014-04-16 DIAGNOSIS — R197 Diarrhea, unspecified: Secondary | ICD-10-CM

## 2014-04-16 DIAGNOSIS — I4891 Unspecified atrial fibrillation: Secondary | ICD-10-CM

## 2014-04-16 LAB — POCT INR: INR: 3.2

## 2014-04-16 MED ORDER — FLUCONAZOLE 100 MG PO TABS: ORAL_TABLET | ORAL | Status: DC

## 2014-04-16 MED ORDER — METRONIDAZOLE 500 MG PO TABS: 500.0000 mg | ORAL_TABLET | Freq: Three times a day (TID) | ORAL | Status: DC

## 2014-04-16 NOTE — Progress Notes (Signed)
Agree with initial assessment and plans 

## 2014-04-16 NOTE — Progress Notes (Signed)
History of Present Illness:  Patient is a 64 year old female with multiple, significant medical problems. She is on multiple medications including Coumadin. Patient was followed by Dr. Sharlett Iles until his retirement. He is now followed by Dr. Henrene Pastor who outlined her GI history in his 10/26/13.  Patient was seen in emergency department 04/08/58 with abdominal pain. White count was normal. CT scan revealed sigmoid diverticulitis. Patient was discharged from ED on Cipro BID x 10 days and Flagyl BID for 7 days. he was given Flagyl twice a day for 7 days, Cipro twice daily for 10 days. Flagyl completed yesterday. Her pain is a lot better, mainly just sore now.  Mrs. Blanch Media gives a 6-7 month history of diarrhea, several loose, non-bloody stools a day. Over the last few days stools have actually thickened. TSH in October was normal. Hgb A1C then was 6.6.    Current Medications, Allergies, Past Medical History, Past Surgical History, Family History and Social History were reviewed in [employer] record.  Studies:   Ct Abdomen Pelvis W Contrast  04/08/2014   CLINICAL DATA:  Low abdominal to pelvic pain for a couple of days with some nausea Hx of Diverticulosis Hx of hypothyroidism  Hx of skin ca  EXAM: CT ABDOMEN AND PELVIS WITH CONTRAST  TECHNIQUE: Multidetector CT imaging of the abdomen and pelvis was performed using the standard protocol following bolus administration of intravenous contrast.  CONTRAST:  123mL OMNIPAQUE IOHEXOL 300 MG/ML  SOLN  COMPARISON:  08/18/2013  FINDINGS: There are inflammatory changes surrounding thick-walled segment of the mid sigmoid colon, extending for approximately 7 cm. There are several diverticula in this location, 1 in particular which appears inflamed. Findings are most consistent with diverticulitis. There is no extraluminal air. No fluid collection is seen to suggest an abscess.  There other scattered diverticula along the left colon with no  other evidence diverticulitis. Remainder of the colon is unremarkable. Normal small bowel. Normal appendix.  4 mm nodule at the base of the oblique fissure on the right, stable from the prior study. Lung bases otherwise essentially clear.  Mild diffuse fatty infiltration of the liver. No liver mass or focal lesion.  Normal spleen. The gallbladder surgically absent. No bile duct dilation.  Normal pancreas and adrenal glands.  4 mm low-density lesion in the posterior lower pole of the right kidney, likely a cyst. No other renal abnormalities. No hydronephrosis. Normal ureters. Normal bladder.  Uterus surgically absent.  Few prominent lymph nodes along the gastrohepatic ligament, stable and presumed reactive. No other prominent lymph nodes.  No ascites.  No free air.  There are degenerative changes at L4-L5 with a grade 1 anterolisthesis. No osteoblastic or osteolytic lesions.  IMPRESSION: 1. Uncomplicated sigmoid colon diverticulitis. No extraluminal air. No abscess. 2. No other acute findings.   Electronically Signed   By: Lajean Manes M.D.   On: 04/08/2014 13:13    Physical Exam: General: Pleasant, well developed , white female in no acute distress Head: Normocephalic and atraumatic Eyes:  sclerae anicteric, conjunctiva pink  Ears: Normal auditory acuity Lungs: Clear throughout to auscultation Heart: Regular rate and rhythm Abdomen: Soft, non distended, non-tender. No masses, no hepatomegaly. Normal bowel sounds Musculoskeletal: Symmetrical with no gross deformities  Extremities: No edema  Neurological: Alert oriented x 4, grossly nonfocal Psychological:  Alert and cooperative. Normal mood and affect  Assessment and Recommendations:  49. 64 year old female with uncomplicated sigmoid diverticulitis confirmed by CT scan. She is feeling better on antibiotics.  Taking BID cipro for 14 days but only got a weeks worth of Flagyl. Will extend flagyl for 3 additional days..  2. Diarrhea. Patient gives a  6-7 month history of diarrhea, ?etiolgy. Infectious seems unlikely.  IBS ? Marland Kitchen Medication related (Glucophage?)   Her stools have actually thickened up over the last few days (possibly from antibiotics?).  Patient will follow-up here in a few weeks. Depending on clinical course she may need stool studies and/or further workup. Will consider treating her empirically for small intestime bacterial overgrowth  3. Up-to-date on colonoscopy. Last one August 2010, done for rectal bleeding Findings included moderate diverticulosis throughout the colon  4. Afib, on chronic Coumadin. Patient is getting her INR closely monitored while on antibiotics.

## 2014-04-16 NOTE — Patient Instructions (Signed)
We have sent the following medications to your pharmacy for you to pick up at your convenience:  Flagyl    

## 2014-04-17 ENCOUNTER — Telehealth: Payer: Self-pay | Admitting: *Deleted

## 2014-04-17 NOTE — Telephone Encounter (Signed)
Pt called in asking about refill on Coumadin. Pharmacy called and they had deactivated the Rx because the directions were not clear. I called Dr Elie Confer and he is seeing pt frequently so directions change.  Refill is coumadin 5 mg tablets take 1/2 tablet daily or as directed.  # 30 with 1 refill.  [facility] wants 90 day supply but I only gave 60 days supply.  They will send out all 60 at one time as they are mail order.

## 2014-04-18 ENCOUNTER — Other Ambulatory Visit: Payer: Self-pay | Admitting: Internal Medicine

## 2014-04-18 MED ORDER — WARFARIN SODIUM 5 MG PO TABS: 2.5000 mg | ORAL_TABLET | Freq: Every day | ORAL | Status: DC

## 2014-04-19 ENCOUNTER — Other Ambulatory Visit: Payer: Self-pay | Admitting: Internal Medicine

## 2014-04-19 ENCOUNTER — Encounter: Payer: Self-pay | Admitting: Internal Medicine

## 2014-04-19 ENCOUNTER — Ambulatory Visit (INDEPENDENT_AMBULATORY_CARE_PROVIDER_SITE_OTHER): Payer: Commercial Managed Care - HMO | Admitting: Pharmacist

## 2014-04-19 ENCOUNTER — Ambulatory Visit (INDEPENDENT_AMBULATORY_CARE_PROVIDER_SITE_OTHER): Payer: Commercial Managed Care - HMO | Admitting: Internal Medicine

## 2014-04-19 VITALS — BP 115/65 | HR 86 | Temp 98.2°F | Wt 186.7 lb

## 2014-04-19 DIAGNOSIS — R197 Diarrhea, unspecified: Secondary | ICD-10-CM

## 2014-04-19 DIAGNOSIS — G8929 Other chronic pain: Secondary | ICD-10-CM

## 2014-04-19 DIAGNOSIS — E039 Hypothyroidism, unspecified: Secondary | ICD-10-CM

## 2014-04-19 DIAGNOSIS — Z0000 Encounter for general adult medical examination without abnormal findings: Secondary | ICD-10-CM

## 2014-04-19 DIAGNOSIS — Z5181 Encounter for therapeutic drug level monitoring: Secondary | ICD-10-CM

## 2014-04-19 DIAGNOSIS — K5732 Diverticulitis of large intestine without perforation or abscess without bleeding: Secondary | ICD-10-CM

## 2014-04-19 DIAGNOSIS — E119 Type 2 diabetes mellitus without complications: Secondary | ICD-10-CM

## 2014-04-19 DIAGNOSIS — I4891 Unspecified atrial fibrillation: Secondary | ICD-10-CM

## 2014-04-19 DIAGNOSIS — M549 Dorsalgia, unspecified: Secondary | ICD-10-CM

## 2014-04-19 LAB — CBC WITH DIFFERENTIAL/PLATELET
Basophils Absolute: 0 10*3/uL (ref 0.0–0.1)
Basophils Relative: 0 % (ref 0–1)
EOS PCT: 2 % (ref 0–5)
Eosinophils Absolute: 0.2 10*3/uL (ref 0.0–0.7)
HCT: 39.5 % (ref 36.0–46.0)
Hemoglobin: 12.9 g/dL (ref 12.0–15.0)
LYMPHS ABS: 2.1 10*3/uL (ref 0.7–4.0)
Lymphocytes Relative: 28 % (ref 12–46)
MCH: 28.9 pg (ref 26.0–34.0)
MCHC: 32.7 g/dL (ref 30.0–36.0)
MCV: 88.6 fL (ref 78.0–100.0)
MPV: 9.7 fL (ref 8.6–12.4)
Monocytes Absolute: 0.5 10*3/uL (ref 0.1–1.0)
Monocytes Relative: 6 % (ref 3–12)
Neutro Abs: 4.9 10*3/uL (ref 1.7–7.7)
Neutrophils Relative %: 64 % (ref 43–77)
Platelets: 322 10*3/uL (ref 150–400)
RBC: 4.46 MIL/uL (ref 3.87–5.11)
RDW: 14.3 % (ref 11.5–15.5)
WBC: 7.6 10*3/uL (ref 4.0–10.5)

## 2014-04-19 LAB — POCT GLYCOSYLATED HEMOGLOBIN (HGB A1C): Hemoglobin A1C: 6.2

## 2014-04-19 LAB — POCT INR: INR: 2.8

## 2014-04-19 LAB — GLUCOSE, CAPILLARY: Glucose-Capillary: 74 mg/dL (ref 70–99)

## 2014-04-19 NOTE — Progress Notes (Signed)
Anti-Coagulation Progress Note  [name] is a 64 y.o. female who is currently on an anti-coagulation regimen.    RECENT RESULTS: Recent results are below, the most recent result is correlated with a dose of 2.5mg /day while on 2 antibiotics known to have a marked hypoprothrombinemic response. Now OFF those antibiotics.  Lab Results  Component Value Date   INR 2.80 04/19/2014   INR 2.40 04/11/2014   INR 2.10 04/09/2014   PROTIME 18.8 08/20/2008    ANTI-COAG DOSE: Anticoagulation Dose Instructions as of 04/19/2014      Sun Mon Tue Wed Thu Fri Sat   New Dose 2.5 mg 0 mg 0 mg 0 mg 5 mg 2.5 mg 5 mg       ANTICOAG SUMMARY: Anticoagulation Episode Summary    Current INR goal 2.0-3.0  Next INR check 04/23/2014  INR from last check 2.80 (04/19/2014)  Weekly max dose   Target end date   [facility] check location [facility]  Preferred lab   Send INR reminders to    Indications  TIA [G45.9] Atrial fibrillation [I48.91] Encounter for therapeutic drug monitoring [Z51.81]        Comments Needs Lovenox Bridge      Anticoagulation Care Providers    Provider Role Specialty Phone number   Cresenciano Genre, MD Responsible Internal Medicine [phone]      ANTICOAG TODAY: Anticoagulation Summary as of 04/19/2014    INR goal 2.0-3.0  Selected INR 2.80 (04/19/2014)  Next INR check 04/23/2014  Target end date    Indications  TIA [G45.9] Atrial fibrillation [I48.91] Encounter for therapeutic drug monitoring [Z51.81]      Anticoagulation Episode Summary    [facility] check location [facility]   Preferred lab    Send INR reminders to    Comments Needs Lovenox Bridge    Anticoagulation Care Providers    Provider Role Specialty Phone number   Cresenciano Genre, MD Responsible Internal Medicine [phone]      PATIENT INSTRUCTIONS: Patient Instructions  Patient instructed to take medications as defined in the Anti-coagulation Track section of this encounter.  Patient instructed  to take today's dose.  Patient verbalized understanding of these instructions.       FOLLOW-UP Return in 4 days (on 04/23/2014) for Follow up INR at 2:45PM.  Jorene Guest, III Pharm.D., CACP

## 2014-04-19 NOTE — Progress Notes (Signed)
Anti-Coagulation Progress Note  [name] is a 64 y.o. female who is currently on an anti-coagulation regimen.    RECENT RESULTS: Recent results are below, the most recent result is correlated with a dose of 2.5mg  per day until seen next.  Lab Results  Component Value Date   INR 2.80 04/19/2014   INR 3.20 04/16/2014   INR 2.40 04/11/2014   PROTIME 18.8 08/20/2008    ANTI-COAG DOSE: Anticoagulation Dose Instructions as of 04/16/2014      [name] Tue Wed Thu Fri Sat   New Dose 2.5 mg 0 mg 2.5 mg 2.5 mg 2.5 mg 2.5 mg 2.5 mg       ANTICOAG SUMMARY: Anticoagulation Episode Summary    Current INR goal 2.0-3.0  Next INR check 04/23/2014  INR from last check 2.80 (04/19/2014)  Weekly max dose   Target end date   [facility] check location [facility]  Preferred lab   Send INR reminders to    Indications  TIA [G45.9] Atrial fibrillation [I48.91] Encounter for therapeutic drug monitoring [Z51.81]        Comments Needs Lovenox Bridge      Anticoagulation Care Providers    Provider Role Specialty Phone number   Cresenciano Genre, MD Responsible Internal Medicine [phone]      ANTICOAG TODAY: Anticoagulation Summary as of 04/16/2014    INR goal 2.0-3.0  Selected INR 3.20! (04/16/2014)  Next INR check 04/18/2014  Target end date    Indications  TIA [G45.9] Atrial fibrillation [I48.91] Encounter for therapeutic drug monitoring [Z51.81]      Anticoagulation Episode Summary    [facility] check location [facility]   Preferred lab    Send INR reminders to    Comments Needs Lovenox Bridge    Anticoagulation Care Providers    Provider Role Specialty Phone number   Cresenciano Genre, MD Responsible Internal Medicine [phone]      PATIENT INSTRUCTIONS: Patient Instructions  Patient instructed to take medications as defined in the Anti-coagulation Track section of this encounter.  Patient instructed to OMIT today's dose on Monday 8-FEB-16.  Patient verbalized understanding  of these instructions.       FOLLOW-UP Return in 2 days (on 04/18/2014) for Follow up INR at 2:45PM.  Jorene Guest, III Pharm.D., CACP

## 2014-04-19 NOTE — Patient Instructions (Addendum)
General Instructions: Please follow up in 2 weeks to 1 month sooner if needed Bring stool sample back Monday Take care  We will check labs today and make you a follow up with Dr. Henrene Pastor.    Treatment Goals:  Goals (1 Years of Data) as of 04/19/14          As of Today 04/16/14 04/08/14 04/08/14 04/08/14     Blood Pressure   . Blood Pressure < 140/90  115/65 110/64 99/61 99/61  102/54     Result Component   . HEMOGLOBIN A1C < 7.0  6.2          Progress Toward Treatment Goals:  Treatment Goal 04/19/2014  Hemoglobin A1C at goal  Blood pressure at goal  Prevent falls -    Self Care Goals & Plans:  Self Care Goal 04/19/2014  Manage my medications take my medicines as prescribed; bring my medications to every visit; refill my medications on time; follow the sick day instructions if I am sick  Monitor my health keep track of my blood pressure  Eat healthy foods drink diet soda or water instead of juice or soda; eat more vegetables; eat foods that are low in salt; eat baked foods instead of fried foods; eat fruit for snacks and desserts; eat smaller portions  Be physically active find an activity I enjoy  Prevent falls -  Meeting treatment goals maintain the current self-care plan    Home Blood Glucose Monitoring 04/19/2014  Check my blood sugar no home glucose monitoring  When to check my blood sugar N/A     Care Management & Community Referrals:  Referral 04/19/2014  Referrals made for care management support none needed  Referrals made to community resources none        Chronic Diarrhea Diarrhea is frequent loose and watery bowel movements. It can cause you to feel weak and dehydrated. Dehydration can cause you to become tired and thirsty and to have a dry mouth, decreased urination, and dark yellow urine. Diarrhea is a sign of another problem, most often an infection that will not last long. In most cases, diarrhea lasts 2-3 days. Diarrhea that lasts longer than 4 weeks is  called long-lasting (chronic) diarrhea. It is important to treat your diarrhea as directed by your health care provider to lessen or prevent future episodes of diarrhea.  CAUSES  There are many causes of chronic diarrhea. The following are some possible causes:   Gastrointestinal infections caused by viruses, bacteria, or parasites.   Food poisoning or food allergies.   Certain medicines, such as antibiotics, chemotherapy, and laxatives.   Artificial sweeteners and fructose.   Digestive disorders, such as celiac disease and inflammatory bowel diseases.   Irritable bowel syndrome.  Some disorders of the pancreas.  Disorders of the thyroid.  Reduced blood flow to the intestines.  Cancer. Sometimes the cause of chronic diarrhea is unknown. RISK FACTORS  Having a severely weakened immune system, such as from HIV or AIDS.   Taking certain types of cancer-fighting drugs (such as with chemotherapy) or other medicines.   Having had a recent organ transplant.   Having a portion of the stomach or small bowel removed.   Traveling to countries where food and water supplies are often contaminated.  SYMPTOMS  In addition to frequent, loose stools, diarrhea may cause:   Cramping.   Abdominal pain.   Nausea.   Fever.  Fatigue.  Urgent need to use the bathroom.  Loss of bowel control.  DIAGNOSIS  Your health care provider must take a careful history and perform a physical exam. Tests given are based on your symptoms and history. Tests may include:   Blood or stool tests. Three or more stool samples may be examined. Stool cultures may be used to test for bacteria or parasites.   X-rays.   A procedure in which a thin tube is inserted into the mouth or rectum (endoscopy). This allows the health care provider to look inside the intestine.  TREATMENT   Treatment is aimed at correcting the cause of the diarrhea when possible.  Diarrhea caused by an infection can  often be treated with antibiotic medicines.  Diarrhea not caused by an infection may require you to take long-term medicine or have surgery. Specific treatment should be discussed with your health care provider.  If the cause cannot be determined, treatment aims to relieve symptoms and prevent dehydration. Serious health problems can occur if you do not maintain proper fluid levels. Treatment may include:  Taking an oral rehydration solution (ORS).  Not drinking beverages that contain caffeine (such as tea, coffee, and soft drinks).  Not drinking alcohol.  Maintaining well-balanced nutrition to help you recover faster. HOME CARE INSTRUCTIONS   Drink enough fluids to keep urine clear or pale yellow. Drink 1 cup (8 oz) of fluid for each diarrhea episode. Avoid fluids that contain simple sugars, fruit juices, whole milk products, and sodas. Hydrate with an ORS. You may purchase the ORS or prepare it at home by mixing the following ingredients together:   - tsp (1.7-3  mL) table salt.   tsp (3  mL) baking soda.   tsp (1.7 mL) salt substitute containing potassium chloride.  1 tbsp (20 mL) sugar.  4.2 c (1 L) of water.   Certain foods and beverages may increase the speed at which food moves through the gastrointestinal (GI) tract. These foods and beverages should be avoided. They include:  Caffeinated and alcoholic beverages.  High-fiber foods, such as raw fruits and vegetables, nuts, seeds, and whole grain breads and cereals.  Foods and beverages sweetened with sugar alcohols, such as xylitol, sorbitol, and mannitol.   Some foods may be well tolerated and may help thicken stool. These include:  Starchy foods, such as rice, toast, pasta, low-sugar cereal, oatmeal, grits, baked potatoes, crackers, and bagels.  Bananas.  Applesauce.  Add probiotic-rich foods to help increase healthy bacteria in the GI tract. These include yogurt and fermented milk products.  Wash your hands  well after each diarrhea episode.  Only take over-the-counter or prescription medicines as directed by your health care provider.  Take a warm bath to relieve any burning or pain from frequent diarrhea episodes. SEEK MEDICAL CARE IF:   You are not urinating as often.  Your urine is a dark color.  You become very tired or dizzy.  You have severe pain in the abdomen or rectum.  Your have blood or pus in your stools.  Your stools look black and tarry. SEEK IMMEDIATE MEDICAL CARE IF:   You are unable to keep fluids down.  You have persistent vomiting.  You have blood in your stool.  Your stools are black and tarry.  You do not urinate in 6-8 hours, or there is only a small amount of very dark urine.  You have abdominal pain that increases or localizes.  You have weakness, dizziness, confusion, or lightheadedness.  You have a severe headache.  Your diarrhea gets worse or does not get  better.  You have a fever or persistent symptoms for more than 2-3 days.  You have a fever and your symptoms suddenly get worse. MAKE SURE YOU:   Understand these instructions.  Will watch your condition.  Will get help right away if you are not doing well or get worse. Document Released: 05/16/2003 Document Revised: 02/28/2013 Document Reviewed: 08/18/2012 [facility] Patient Information 2015 [place], [place]. This information is not intended to replace advice given to you by your health care provider. Make sure you discuss any questions you have with your health care provider.

## 2014-04-19 NOTE — Patient Instructions (Signed)
Patient instructed to take medications as defined in the Anti-coagulation Track section of this encounter.  Patient instructed to take today's dose.  Patient verbalized understanding of these instructions.    

## 2014-04-19 NOTE — Patient Instructions (Signed)
Patient instructed to take medications as defined in the Anti-coagulation Track section of this encounter.  Patient instructed to OMIT today's dose on Monday 8-FEB-16.  Patient verbalized understanding of these instructions.

## 2014-04-20 ENCOUNTER — Encounter: Payer: Self-pay | Admitting: Internal Medicine

## 2014-04-20 ENCOUNTER — Other Ambulatory Visit: Payer: Self-pay

## 2014-04-20 ENCOUNTER — Encounter (HOSPITAL_COMMUNITY): Payer: Self-pay

## 2014-04-20 ENCOUNTER — Emergency Department (HOSPITAL_COMMUNITY)
Admission: EM | Admit: 2014-04-20 | Discharge: 2014-04-20 | Disposition: A | Discharge status: Home / Self Care | Payer: Commercial Managed Care - HMO | Attending: Emergency Medicine | Admitting: Emergency Medicine

## 2014-04-20 ENCOUNTER — Telehealth: Payer: Self-pay | Admitting: Internal Medicine

## 2014-04-20 ENCOUNTER — Telehealth: Payer: Self-pay | Admitting: *Deleted

## 2014-04-20 DIAGNOSIS — Z91040 Latex allergy status: Secondary | ICD-10-CM | POA: Insufficient documentation

## 2014-04-20 DIAGNOSIS — E039 Hypothyroidism, unspecified: Secondary | ICD-10-CM | POA: Diagnosis not present

## 2014-04-20 DIAGNOSIS — Z7901 Long term (current) use of anticoagulants: Secondary | ICD-10-CM | POA: Diagnosis not present

## 2014-04-20 DIAGNOSIS — K219 Gastro-esophageal reflux disease without esophagitis: Secondary | ICD-10-CM | POA: Insufficient documentation

## 2014-04-20 DIAGNOSIS — E669 Obesity, unspecified: Secondary | ICD-10-CM | POA: Insufficient documentation

## 2014-04-20 DIAGNOSIS — Z8673 Personal history of transient ischemic attack (TIA), and cerebral infarction without residual deficits: Secondary | ICD-10-CM | POA: Insufficient documentation

## 2014-04-20 DIAGNOSIS — Z85828 Personal history of other malignant neoplasm of skin: Secondary | ICD-10-CM | POA: Insufficient documentation

## 2014-04-20 DIAGNOSIS — E119 Type 2 diabetes mellitus without complications: Secondary | ICD-10-CM | POA: Diagnosis not present

## 2014-04-20 DIAGNOSIS — R112 Nausea with vomiting, unspecified: Secondary | ICD-10-CM | POA: Diagnosis present

## 2014-04-20 DIAGNOSIS — F329 Major depressive disorder, single episode, unspecified: Secondary | ICD-10-CM | POA: Diagnosis not present

## 2014-04-20 DIAGNOSIS — Z8739 Personal history of other diseases of the musculoskeletal system and connective tissue: Secondary | ICD-10-CM | POA: Insufficient documentation

## 2014-04-20 DIAGNOSIS — R55 Syncope and collapse: Secondary | ICD-10-CM | POA: Diagnosis not present

## 2014-04-20 DIAGNOSIS — I509 Heart failure, unspecified: Secondary | ICD-10-CM | POA: Diagnosis not present

## 2014-04-20 DIAGNOSIS — Z880 Allergy status to penicillin: Secondary | ICD-10-CM | POA: Diagnosis not present

## 2014-04-20 DIAGNOSIS — Z79899 Other long term (current) drug therapy: Secondary | ICD-10-CM | POA: Insufficient documentation

## 2014-04-20 DIAGNOSIS — R197 Diarrhea, unspecified: Secondary | ICD-10-CM | POA: Insufficient documentation

## 2014-04-20 DIAGNOSIS — E785 Hyperlipidemia, unspecified: Secondary | ICD-10-CM | POA: Insufficient documentation

## 2014-04-20 DIAGNOSIS — R1110 Vomiting, unspecified: Secondary | ICD-10-CM | POA: Diagnosis not present

## 2014-04-20 DIAGNOSIS — K529 Noninfective gastroenteritis and colitis, unspecified: Secondary | ICD-10-CM

## 2014-04-20 DIAGNOSIS — E876 Hypokalemia: Secondary | ICD-10-CM | POA: Diagnosis not present

## 2014-04-20 LAB — URINALYSIS, ROUTINE W REFLEX MICROSCOPIC
Bilirubin Urine: NEGATIVE
Glucose, UA: NEGATIVE mg/dL
Hgb urine dipstick: NEGATIVE
KETONES UR: 15 mg/dL — AB
Leukocytes, UA: NEGATIVE
Nitrite: NEGATIVE
Protein, ur: 30 mg/dL — AB
Specific Gravity, Urine: 1.021 (ref 1.005–1.030)
UROBILINOGEN UA: 0.2 mg/dL (ref 0.0–1.0)
pH: 6 (ref 5.0–8.0)

## 2014-04-20 LAB — COMPLETE METABOLIC PANEL WITH GFR
ALT: 15 U/L (ref 0–35)
AST: 22 U/L (ref 0–37)
Albumin: 3.8 g/dL (ref 3.5–5.2)
Alkaline Phosphatase: 69 U/L (ref 39–117)
BUN: 8 mg/dL (ref 6–23)
CO2: 23 meq/L (ref 19–32)
Calcium: 9.3 mg/dL (ref 8.4–10.5)
Chloride: 104 mEq/L (ref 96–112)
Creat: 0.67 mg/dL (ref 0.50–1.10)
Glucose, Bld: 145 mg/dL — ABNORMAL HIGH (ref 70–99)
POTASSIUM: 3.8 meq/L (ref 3.5–5.3)
Sodium: 141 mEq/L (ref 135–145)
Total Bilirubin: 0.7 mg/dL (ref 0.2–1.2)
Total Protein: 6.3 g/dL (ref 6.0–8.3)

## 2014-04-20 LAB — CBC WITH DIFFERENTIAL/PLATELET
BASOS PCT: 0 % (ref 0–1)
Basophils Absolute: 0 10*3/uL (ref 0.0–0.1)
EOS ABS: 0 10*3/uL (ref 0.0–0.7)
Eosinophils Relative: 0 % (ref 0–5)
HEMATOCRIT: 40.9 % (ref 36.0–46.0)
HEMOGLOBIN: 13.9 g/dL (ref 12.0–15.0)
Lymphocytes Relative: 17 % (ref 12–46)
Lymphs Abs: 1.5 10*3/uL (ref 0.7–4.0)
MCH: 29.1 pg (ref 26.0–34.0)
MCHC: 34 g/dL (ref 30.0–36.0)
MCV: 85.7 fL (ref 78.0–100.0)
Monocytes Absolute: 0.3 10*3/uL (ref 0.1–1.0)
Monocytes Relative: 3 % (ref 3–12)
Neutro Abs: 6.9 10*3/uL (ref 1.7–7.7)
Neutrophils Relative %: 80 % — ABNORMAL HIGH (ref 43–77)
Platelets: 317 10*3/uL (ref 150–400)
RBC: 4.77 MIL/uL (ref 3.87–5.11)
RDW: 13.2 % (ref 11.5–15.5)
WBC: 8.7 10*3/uL (ref 4.0–10.5)

## 2014-04-20 LAB — LIPID PANEL
Cholesterol: 133 mg/dL (ref 0–200)
HDL: 46 mg/dL (ref >39)
LDL CALC: 51 mg/dL (ref 0–99)
Total CHOL/HDL Ratio: 2.9 Ratio
Triglycerides: 181 mg/dL — ABNORMAL HIGH (ref <150)
VLDL: 36 mg/dL (ref 0–40)

## 2014-04-20 LAB — COMPREHENSIVE METABOLIC PANEL
ALT: 21 U/L (ref 0–35)
AST: 33 U/L (ref 0–37)
Albumin: 3.8 g/dL (ref 3.5–5.2)
Alkaline Phosphatase: 75 U/L (ref 39–117)
Anion gap: 12 (ref 5–15)
BUN: 7 mg/dL (ref 6–23)
CO2: 22 mmol/L (ref 19–32)
Calcium: 9.5 mg/dL (ref 8.4–10.5)
Chloride: 106 mmol/L (ref 96–112)
Creatinine, Ser: 0.72 mg/dL (ref 0.50–1.10)
GFR calc Af Amer: >90 mL/min (ref >90)
GFR calc non Af Amer: 88 mL/min — ABNORMAL LOW (ref >90)
Glucose, Bld: 171 mg/dL — ABNORMAL HIGH (ref 70–99)
Potassium: 3.2 mmol/L — ABNORMAL LOW (ref 3.5–5.1)
SODIUM: 140 mmol/L (ref 135–145)
Total Bilirubin: 1.1 mg/dL (ref 0.3–1.2)
Total Protein: 6.9 g/dL (ref 6.0–8.3)

## 2014-04-20 LAB — LIPASE, BLOOD: Lipase: 37 U/L (ref 11–59)

## 2014-04-20 LAB — URINE MICROSCOPIC-ADD ON

## 2014-04-20 LAB — T4, FREE: Free T4: 1.06 ng/dL (ref 0.80–1.80)

## 2014-04-20 LAB — CLOSTRIDIUM DIFFICILE BY PCR: Toxigenic C. Difficile by PCR: NEGATIVE

## 2014-04-20 LAB — MAGNESIUM: MAGNESIUM: 1 mg/dL — AB (ref 1.5–2.5)

## 2014-04-20 LAB — CBG MONITORING, ED: Glucose-Capillary: 177 mg/dL — ABNORMAL HIGH (ref 70–99)

## 2014-04-20 LAB — TSH: TSH: 1.494 u[IU]/mL (ref 0.350–4.500)

## 2014-04-20 MED ORDER — DIPHENOXYLATE-ATROPINE 2.5-0.025 MG PO TABS: 1.0000 | ORAL_TABLET | Freq: Four times a day (QID) | ORAL | Status: DC | PRN

## 2014-04-20 MED ORDER — ONDANSETRON 4 MG PO TBDP
8.0000 mg | ORAL_TABLET | Freq: Once | ORAL | Status: AC
  Administered 2014-04-20: 8 mg via ORAL

## 2014-04-20 MED ORDER — ONDANSETRON 4 MG PO TBDP
ORAL_TABLET | ORAL | Status: AC
  Filled 2014-04-20: qty 2

## 2014-04-20 MED ORDER — SODIUM CHLORIDE 0.9 % IV BOLUS (SEPSIS)
1000.0000 mL | Freq: Once | INTRAVENOUS | Status: AC
  Administered 2014-04-20: 1000 mL via INTRAVENOUS

## 2014-04-20 MED ORDER — POTASSIUM CHLORIDE CRYS ER 20 MEQ PO TBCR
40.0000 meq | EXTENDED_RELEASE_TABLET | Freq: Once | ORAL | Status: AC
  Administered 2014-04-20: 40 meq via ORAL
  Filled 2014-04-20: qty 2

## 2014-04-20 NOTE — Assessment & Plan Note (Signed)
Check lipid panel today 

## 2014-04-20 NOTE — Assessment & Plan Note (Signed)
BP Readings from Last 3 Encounters:  04/19/14 115/65  04/16/14 110/64  04/08/14 99/61    Lab Results  Component Value Date   NA 141 04/19/2014   K 3.8 04/19/2014   CREATININE 0.67 04/19/2014    Assessment: Blood pressure control: controlled Progress toward BP goal:  at goal Comments: none  Plan: Medications:  continue current medications (Dilt XL 180), Lasix 20 qd, Cozaar 25 mg qd, statin Other plans: f/u in 3 months

## 2014-04-20 NOTE — Telephone Encounter (Signed)
Pt called clinic stating she was seen in clinic yesterday for  Today pt is c/o abd pain, talked with [name] and advised to go to ED now for evaluation.

## 2014-04-20 NOTE — Assessment & Plan Note (Signed)
Lab Results  Component Value Date   HGBA1C 6.2 04/19/2014   HGBA1C 6.6 12/21/2013   HGBA1C 8.2 09/07/2013     Assessment: Diabetes control: good control (HgbA1C at goal) Progress toward A1C goal:  at goal Comments: none  Plan: Medications:  continue current medications (Metformin 1000 mg bid) Home glucose monitoring: Frequency: no home glucose monitoring Timing: N/A Instruction/counseling given: discussed foot care  Other plans: f/u in 3 months, foot exam and HA1C today

## 2014-04-20 NOTE — Progress Notes (Addendum)
   Subjective:    Patient ID: [name], female    DOB: 1951/02/08, 64 y.o.   MRN: [id number]  HPI Comments: 64 y.o with multiple chronic medical problems presents for f/u  1. Recent diverticulitis noted 04/08/14 on CT ab/pelvis.  Completed course of cipro (10day) and flagyl (500 mg bid #14 with ED on 1/31 and 500 mg x 3day tid w/ GI on 2/8 total 7 day).  Denies nausea/vomiting but still having left lower quadrant ab pain.  She followed with GI 04/16/14 and course of flagyl was extended and she completed the last dose today.   2.  She c/o diarrhea x 5-6 months.  Review of last GI was considering further w/u with stool studies.  Pt states she had 12 episodes of diarrhea yesterday and 3 total on am in 2/11.  Diarrhea started prior to antibiotics.  She states stools are loose and watery but have thickened up some.  If she eats a meal she will have diarrhea but she denies any association with diarrhea and Metformin use.    3. H/o DM HA1C today is 6.2 she is only taking Metformin 1000 mg bid. DM controlled. cbg 74 currently but she has only eaten breakfast today.   4. H/o AF on chronic coumadin INR 2.8 today  5. H/o hypothyroidism dose decreased months ago due to tsh being low. Will recheck tsh and free T4 today.    6. C/o right of spine upper back pain intermittently.  She has stopped narcotics in the past due to rebound h/a's  HM-sch DEXA at next appt, also due for Prevnar 09/2014, also give Rx for Zoster vaccine at next appt, will check lipid panel today, do foot exam, HA1C     Review of Systems  Respiratory: Negative for shortness of breath.   Cardiovascular: Negative for chest pain.  Gastrointestinal: Positive for abdominal pain and diarrhea. Negative for nausea and vomiting.  Musculoskeletal: Positive for back pain.       Objective:   Physical Exam  Constitutional: She is oriented to person, place, and time. Vital signs are normal. She appears well-developed and well-nourished. She  is cooperative. No distress.  HENT:  Head: Normocephalic and atraumatic.  Mouth/Throat: Oropharynx is clear and moist and mucous membranes are normal. Abnormal dentition. No oropharyngeal exudate.  Eyes: Conjunctivae are normal. Pupils are equal, round, and reactive to light. Right eye exhibits no discharge. Left eye exhibits no discharge. No scleral icterus.  Cardiovascular: Normal rate and normal heart sounds.  An irregular rhythm present.  No murmur heard. No lower ext edema   Pulmonary/Chest: Effort normal and breath sounds normal. No respiratory distress. She has no wheezes.  Abdominal: Soft. Bowel sounds are normal. There is tenderness in the left lower quadrant.    Neurological: She is alert and oriented to person, place, and time. Gait normal.  Skin: Skin is warm, dry and intact. No rash noted. She is not diaphoretic.  Psychiatric: She has a normal mood and affect. Her speech is normal and behavior is normal. Judgment and thought content normal. Cognition and memory are normal.  Nursing note and vitals reviewed.         Assessment & Plan:  F/u in 2-4 weeks with IM or GI (Dr. Henrene Pastor) otherwise 3 months

## 2014-04-20 NOTE — Assessment & Plan Note (Signed)
INR 2.8 today taking 2.5 Coumadin qd to follow up with Dr. Elie Confer

## 2014-04-20 NOTE — Assessment & Plan Note (Addendum)
Noted 1/31 CT. Completed Cipro 10 days and Flagyl x 7 days bid plus 3 days tid  Still with mod ttp on exam Could consider tx'ing with Flagyl x 3 more days for total 10 days (as tx'ed with 7 days approximately from doses with ED and GI) and diverticulitis tx is normally 7-10 days, unsure why was not tx'ed for 10 days with both Abx  Will f/u with GI or [facility] in 2-4 weeks

## 2014-04-20 NOTE — Assessment & Plan Note (Signed)
sch DEXA at next appt, also due for Prevnar 09/2014, also give Rx for Zoster vaccine at next appt, will check lipid panel today, do foot exam, HA1C

## 2014-04-20 NOTE — Addendum Note (Signed)
Addended by: Cresenciano Genre on: 04/20/2014 11:31 AM   Modules accepted: Level of Service

## 2014-04-20 NOTE — Assessment & Plan Note (Signed)
Improved since stopped narcotics now taking prn Zanaflex

## 2014-04-20 NOTE — Telephone Encounter (Signed)
Pt calls and states upon waking this am she is "feverish", though does not know TEMP, weak, dizzy and "passing out" episodes, she is ask to call 911 but states her son says he can carry her to the car and bring her to ED, it is agreeable that she come to ED asap

## 2014-04-20 NOTE — ED Notes (Signed)
Pt states she began having N/V/D this morning.

## 2014-04-20 NOTE — Assessment & Plan Note (Signed)
Likely [facility] Advised pt to try Tylenol otc and Zanaflex prn

## 2014-04-20 NOTE — Telephone Encounter (Signed)
Called patient she is feeling nauseated and vomiting (x3), not keeping food down, feels like will pass out. No ab pain currently.  She has not checked her blood sugar due to no meter.  She will come to the ED with her son   Aundra Dubin MD

## 2014-04-20 NOTE — Assessment & Plan Note (Signed)
Now on synthyroid 50 mcg qd will check TSH and free T4 to make sure correct dose

## 2014-04-20 NOTE — Progress Notes (Signed)
[facility] Medicine Clinic Attending Date of visit: 04/19/2014  Case discussed with Dr. Aundra Dubin soon after the resident saw the patient.  We reviewed the resident's history and exam and pertinent patient test results.  I agree with the assessment, diagnosis, and plan of care documented in the resident's note.

## 2014-04-20 NOTE — ED Provider Notes (Addendum)
CSN: [id number]     Arrival date & time 04/20/14  1232 History   First MD Initiated Contact with Patient 04/20/14 1334     Chief Complaint  Patient presents with  . Nausea  . Emesis  . Diarrhea  . Abdominal Pain     (Consider location/radiation/quality/duration/timing/severity/associated sxs/prior Treatment) Patient is a 64 y.o. female presenting with vomiting, diarrhea, and abdominal pain. The history is provided by the patient. No language interpreter was used.  Emesis Severity:  Moderate Duration:  1 day Timing:  Intermittent Number of daily episodes:  3 Quality:  Stomach contents Progression:  Unchanged Chronicity:  New Recent urination:  Normal Relieved by:  Nothing Worsened by:  Nothing tried Associated symptoms: abdominal pain and diarrhea (watery, loose for 5-6 months)   Associated symptoms: no arthralgias, no chills, no cough, no headaches and no sore throat   Abdominal pain:    Location:  LLQ and RLQ   Quality:  Dull   Severity:  Mild   Duration:  2 weeks   Timing:  Constant   Progression:  Improving   Chronicity:  New Diarrhea:    Quality:  Watery   Number of occurrences:  100-15 per day or 5-6 months   Severity:  Moderate   Duration:  6 months   Timing:  Constant   Progression:  Unchanged Risk factors: prior abdominal surgery   Risk factors: no alcohol use, no diabetes, not pregnant now, no sick contacts, no suspect food intake and no travel to endemic areas   Diarrhea Associated symptoms: abdominal pain and vomiting   Associated symptoms: no arthralgias, no chills, no recent cough, no diaphoresis, no fever and no headaches   Abdominal Pain Associated symptoms: diarrhea (watery, loose for 5-6 months) and vomiting   Associated symptoms: no chest pain, no chills, no cough, no dysuria, no fatigue, no fever, no nausea, no shortness of breath and no sore throat     Past Medical History  Diagnosis Date  . Depression   . Obesity   . Skin neoplasm   .  Bunion   . Carotid stenosis   . Fibromyalgia   . Internal hemorrhoid   . GERD (gastroesophageal reflux disease)   . Chronic gastritis   . Transaminase or LDH elevation   . Mild cognitive impairment   . Hyperlipidemia   . Fatty liver   . Lumbar back pain   . Diabetic peripheral neuropathy   . Dysphagia     no documented strictures but responded positively to dilation in past.   . Hypertension   . Cholecystitis     s/p cholecystectomy  . Sarcoidosis   . Fibromyalgia   . CHF (congestive heart failure)   . Skin cancer 1990's    "front of my right leg"  . Atrial fibrillation   . OSA (obstructive sleep apnea) 2003    "can't sleep w/that darm machine"  . Exertional dyspnea   . Hypothyroidism   . Type II diabetes mellitus   . TIA (transient ischemic attack)     07/29/11 pt denies this history  . H/O hiatal hernia   . Epileptic seizure, tonic     .No meds since age of 40.  . Osteoarthritis   . Stroke     tia  . Hypothyroid   . Diverticulosis   . Digoxin toxicity     08/2011  . Bradycardia, drug induced     08/2011 (dig)  . Drug-induced hypotension     08/2011 (Dig)  .  Carpal tunnel syndrome     mild   . Dysrhythmia     HX OF ATRIAL FIBRILATION  . Diverticulitis     dx 04/08/14   Past Surgical History  Procedure Laterality Date  . Hiatal hernia repair  1990    "had to have scar tissue removed 6 months after repair"  . Esophageal dilation    . Cataract extraction w/phaco  10/27/2010    Procedure: CATARACT EXTRACTION PHACO AND INTRAOCULAR LENS PLACEMENT (IOC);  Surgeon: Williams Che;  Location: [facility];  Service: Ophthalmology;  Laterality: Left;  CDE- 1.78  . Cataract extraction w/phaco  07/13/2011    Procedure: CATARACT EXTRACTION PHACO AND INTRAOCULAR LENS PLACEMENT (IOC);  Surgeon: Williams Che, MD;  Location: [facility];  Service: Ophthalmology;  Laterality: Right;  CDE:  1.65  . Liposuction  1992  . Inverted nipples  1992  . Breast surgery  ~ 2010    excision milk  duct; right breast  . Foot surgery  ~ 2011    ~straightened toe left foot & scraped bone below big toe"  . Foot surgery  ~ 2011    "scraped bone of big toe; shortened middle toe; right foot"  . Cholecystectomy  1980's  . Dilation and curettage of uterus  1970; 1976  . Abdominal hysterectomy  1980's    partial  . Tubal ligation  1976  . Bilateral oophorectomy  1980's    "after partial hysterectomy"  . Skin cancer excision  1990's    "front side of right shin"  . Ventral hernia repair  07/29/2011    Procedure: LAPAROSCOPIC VENTRAL HERNIA;  Surgeon: Adin Hector, MD;  Location: [facility];  Service: General;  Laterality: N/A;  multiple incarcerated hernias with mesh  . Other surgical history      mutiple ab surgeries for abdominal hernia  . Other surgical history      multiple dilation of esophagus  . Other surgical history      right lens implant 07/2011, left lens implant 10/2010  . Other surgical history      right breast bx=benign   Family History  Problem Relation Age of Onset  . Crohn's disease Mother   . Colitis Mother     Crohns  . Anesthesia problems Mother   . Arthritis Mother     rheumatoid; maternal side of family also with RA  . Cancer Father     skin  . Diabetes Father   . Heart disease Father   . Melanoma Father   . Coronary artery disease Father   . Diabetes Sister   . Depression Maternal Uncle   . Dementia Maternal Uncle   . Hypotension Neg Hx   . Malignant hyperthermia Neg Hx   . Pseudochol deficiency Neg Hx   . Colon cancer Paternal Uncle     ? if stomach or colon    History  Substance Use Topics  . Smoking status: Never Smoker   . Smokeless tobacco: Never Used  . Alcohol Use: No   OB History    No data available     Review of Systems  Constitutional: Negative for fever, chills, diaphoresis, activity change, appetite change and fatigue.  HENT: Negative for congestion, facial swelling, rhinorrhea and sore throat.   Eyes: Negative for photophobia  and discharge.  Respiratory: Negative for cough, chest tightness and shortness of breath.   Cardiovascular: Negative for chest pain, palpitations and leg swelling.  Gastrointestinal: Positive for vomiting, abdominal pain and diarrhea (  watery, loose for 5-6 months). Negative for nausea.  Endocrine: Negative for polydipsia and polyuria.  Genitourinary: Negative for dysuria, frequency, difficulty urinating and pelvic pain.  Musculoskeletal: Negative for back pain, arthralgias, neck pain and neck stiffness.  Skin: Negative for color change and wound.  Allergic/Immunologic: Negative for immunocompromised state.  Neurological: Negative for facial asymmetry, weakness, numbness and headaches.  Hematological: Does not bruise/bleed easily.  Psychiatric/Behavioral: Negative for confusion and agitation.      Allergies  Gemfibrozil; Latex; Penicillins; Adhesive; Codeine; Ace inhibitors; and Digoxin and related  Home Medications   Prior to Admission medications   Medication Sig Start Date End Date Taking? Authorizing Provider  atenolol (TENORMIN) 25 MG tablet Take 1.5 tablets (37.5 mg total) by mouth 2 (two) times daily. 03/15/14  Yes Cresenciano Genre, MD  Blood Glucose Calibration (ACCU-CHEK SMARTVIEW CONTROL) LIQD 1 Device by In Vitro route as needed. Use to perform control/ calibration on accu-chek meter. diag code E11.9. Non insulin dependent 03/15/14  Yes Cresenciano Genre, MD  ciprofloxacin (CIPRO) 500 MG tablet Take 1 tablet (500 mg total) by mouth 2 (two) times daily. 04/08/14  Yes Carmin Muskrat, MD  diltiazem (CARDIZEM CD) 180 MG 24 hr capsule Take 1 capsule (180 mg total) by mouth daily. 03/15/14  Yes Cresenciano Genre, MD  fluconazole (DIFLUCAN) 100 MG tablet Take on tablet by mouth.  If symptoms have not resolved after a few days, take another tablet by mouth. 04/16/14  Yes Willia Craze, NP  FLUoxetine (PROZAC) 20 MG capsule Take 1 capsule (20 mg total) by mouth daily. 03/27/14 03/27/15 Yes Cresenciano Genre, MD  furosemide (LASIX) 20 MG tablet Take 1 tablet (20 mg total) by mouth daily. 03/15/14  Yes Cresenciano Genre, MD  glucose blood test strip Use as instructed 03/20/14  Yes Cresenciano Genre, MD  Lancets 30G MISC DX Code: E11.9 Check blood sugars one time per day. Accucheck fastclix lancets 03/20/14  Yes Cresenciano Genre, MD  levothyroxine (SYNTHROID, LEVOTHROID) 50 MCG tablet Take 1 tablet (50 mcg total) by mouth every morning. 03/15/14  Yes Cresenciano Genre, MD  losartan (COZAAR) 25 MG tablet Take 0.5 tablets (12.5 mg total) by mouth daily. 03/15/14  Yes Cresenciano Genre, MD  metFORMIN (GLUCOPHAGE) 1000 MG tablet TAKE ONE TABLET TWICE A DAY WITH FOOD 03/15/14  Yes Cresenciano Genre, MD  metroNIDAZOLE (FLAGYL) 500 MG tablet Take 1 tablet (500 mg total) by mouth 3 (three) times daily. 04/16/14  Yes Willia Craze, NP  omeprazole (PRILOSEC) 20 MG capsule Take 1 capsule (20 mg total) by mouth daily. 03/15/14  Yes Cresenciano Genre, MD  potassium chloride (K-DUR) 10 MEQ tablet Take 1 tablet (10 mEq total) by mouth daily. 03/15/14  Yes Cresenciano Genre, MD  pravastatin (PRAVACHOL) 40 MG tablet Take 1 tablet (40 mg total) by mouth daily. 03/15/14  Yes Cresenciano Genre, MD  tiZANidine (ZANAFLEX) 4 MG tablet Take 1 tablet (4 mg total) by mouth every 12 (twelve) hours as needed for muscle spasms. Patient taking differently: Take 2 mg by mouth every 12 (twelve) hours as needed for muscle spasms.  03/15/14  Yes Cresenciano Genre, MD  traMADol (ULTRAM) 50 MG tablet Take 1 tablet (50 mg total) by mouth every 6 (six) hours as needed. 04/08/14  Yes Carmin Muskrat, MD  warfarin (COUMADIN) 5 MG tablet Take 0.5 tablets (2.5 mg total) by mouth at bedtime. Take 2.5mg  daily until further instructed. Patient taking differently: Take  2.5 mg by mouth at bedtime. 5 mg on Thursday and Saturday and 2.5 mg on Friday and Sunday 04/18/14  Yes Cresenciano Genre, MD  diphenoxylate-atropine (LOMOTIL) 2.5-0.025 MG per tablet Take 1 tablet by mouth 4 (four) times daily  as needed for diarrhea or loose stools. 04/20/14   Tanna Furry, MD   BP 142/83 mmHg  Pulse 106  Temp(Src) 97.8 F (36.6 C) (Oral)  Resp 13  Ht 5\' 3"  (1.6 m)  Wt 186 lb (84.369 kg)  BMI 32.96 kg/m2  SpO2 100%  LMP 06/03/1978 Physical Exam  Constitutional: She is oriented to person, place, and time. She appears well-developed and well-nourished. No distress.  HENT:  Head: Normocephalic and atraumatic.  Mouth/Throat: No oropharyngeal exudate.  Eyes: Pupils are equal, round, and reactive to light.  Neck: Normal range of motion. Neck supple.  Cardiovascular: Normal rate, regular rhythm and normal heart sounds.  Exam reveals no gallop and no friction rub.   No murmur heard. Pulmonary/Chest: Effort normal and breath sounds normal. No respiratory distress. She has no wheezes. She has no rales.  Abdominal: Soft. Bowel sounds are normal. She exhibits no distension and no mass. There is tenderness in the right lower quadrant and left lower quadrant. There is no rigidity, no rebound and no guarding.  Musculoskeletal: Normal range of motion. She exhibits no edema or tenderness.  Neurological: She is alert and oriented to person, place, and time.  Skin: Skin is warm and dry.  Psychiatric: She has a normal mood and affect.    ED Course  Procedures (including critical care time) Labs Review Labs Reviewed  CBC WITH DIFFERENTIAL/PLATELET - Abnormal; Notable for the following:    Neutrophils Relative % 80 (*)    All other components within normal limits  COMPREHENSIVE METABOLIC PANEL - Abnormal; Notable for the following:    Potassium 3.2 (*)    Glucose, Bld 171 (*)    GFR calc non Af Amer 88 (*)    All other components within normal limits  URINALYSIS, ROUTINE W REFLEX MICROSCOPIC - Abnormal; Notable for the following:    APPearance CLOUDY (*)    Ketones, ur 15 (*)    Protein, ur 30 (*)    All other components within normal limits  URINE MICROSCOPIC-ADD ON - Abnormal; Notable for the  following:    Bacteria, UA FEW (*)    Casts HYALINE CASTS (*)    All other components within normal limits  CBG MONITORING, ED - Abnormal; Notable for the following:    Glucose-Capillary 177 (*)    All other components within normal limits  CLOSTRIDIUM DIFFICILE BY PCR  STOOL CULTURE  LIPASE, BLOOD  FECAL LACTOFERRIN  FECAL FAT, QUALITATIVE    Imaging Review No results found.   EKG Interpretation   Date/Time:  Friday April 20 2014 14:09:11 EST Ventricular Rate:  107 PR Interval:    QRS Duration: 76 QT Interval:  424 QTC Calculation: 566 R Axis:   19 Text Interpretation:  Atrial fibrillation Ventricular premature complex  Low voltage, precordial leads Borderline repolarization abnormality  Prolonged QT interval which is new, otherwise unchanged.  Confirmed by    MD, [place] [phone]) on 04/20/2014 2:16:22 PM      MDM   Final diagnoses:  Chronic diarrhea  Near syncope  Hypokalemia    Pt is a 64 y.o. female with Pmhx as above who presents with chronic d/a for 5-6 months, today also with 3 episodes of nonbloody emesis.  An episode of near-syncope.  She states that she was treated for diverticulitis about 2 weeks ago and finished her course of antibiotics.  She states her pain has been improved, though she still has some low abdominal soreness.  She's not had any recent fevers or chills.  She still had urinary symptoms.  On physical exam she is mildly tachycardic but in no acute distress.  She has mild lower abdominal tenderness to palpation without rebound or guarding.    White blood cell count, normal, potassium 3.2, creatinine normal.  Lipase is normal.  We were able to send some stool samples which were to be ordered by patient's PCP on Monday. Plan for IVF hydration and PO challenge. Pt can continue outpt PCP f/u. I do not feel she requires repeat abdominal imaging today as it appears her uncomplicated diverticulitis is improving and d/a is chronic.     Clemmie Krill evaluation in the Emergency Department is complete. It has been determined that no acute conditions requiring further emergency intervention are present at this time. The patient/guardian have been advised of the diagnosis and plan. We have discussed signs and symptoms that warrant return to the ED, such as changes or worsening in symptoms, worsening pain, fever, inability to tolerate PO.       Ernestina Patches, MD 04/23/14 1027  Ernestina Patches, MD 05/09/14 [phone]

## 2014-04-20 NOTE — Assessment & Plan Note (Addendum)
Chronic for 5-6 months ? Etiology at this time will r/o infectious with Cdiff and stool culture. Also check fecal fat qual (for malabsorption) and fecal lactoferrin. Also recently on Abx so ? If could be contributing.  Could also consider decreasing dose of Metformin to see if could be contributing to diarrhea  Pt will f/u with GI as well Dr. Henrene Pastor hopefully in the next 1 month will try to schedule Will check CMET, CBC, Magnesium in addition to stool specimans pt to bring back on Monday

## 2014-04-20 NOTE — Telephone Encounter (Signed)
I agree [name]. She needs to be evaluated in the ED ASAP

## 2014-04-20 NOTE — Progress Notes (Signed)
INTERNAL MEDICINE TEACHING ATTENDING ADDENDUM - Tamira Ryland M.D  Duration- indefinite, Indication- afib, TIA, INR- supratherapeutic. Agree with Dr. Gladstone Pih recommendations as outlined in his note.

## 2014-04-20 NOTE — Discharge Instructions (Signed)
Lomotil for diarrhea. Probiotic of choice daily.  Chronic Diarrhea Diarrhea is frequent loose and watery bowel movements. It can cause you to feel weak and dehydrated. Dehydration can cause you to become tired and thirsty and to have a dry mouth, decreased urination, and dark yellow urine. Diarrhea is a sign of another problem, most often an infection that will not last long. In most cases, diarrhea lasts 2-3 days. Diarrhea that lasts longer than 4 weeks is called long-lasting (chronic) diarrhea. It is important to treat your diarrhea as directed by your health care provider to lessen or prevent future episodes of diarrhea.  CAUSES  There are many causes of chronic diarrhea. The following are some possible causes:   Gastrointestinal infections caused by viruses, bacteria, or parasites.   Food poisoning or food allergies.   Certain medicines, such as antibiotics, chemotherapy, and laxatives.   Artificial sweeteners and fructose.   Digestive disorders, such as celiac disease and inflammatory bowel diseases.   Irritable bowel syndrome.  Some disorders of the pancreas.  Disorders of the thyroid.  Reduced blood flow to the intestines.  Cancer. Sometimes the cause of chronic diarrhea is unknown. RISK FACTORS  Having a severely weakened immune system, such as from HIV or AIDS.   Taking certain types of cancer-fighting drugs (such as with chemotherapy) or other medicines.   Having had a recent organ transplant.   Having a portion of the stomach or small bowel removed.   Traveling to countries where food and water supplies are often contaminated.  SYMPTOMS  In addition to frequent, loose stools, diarrhea may cause:   Cramping.   Abdominal pain.   Nausea.   Fever.  Fatigue.  Urgent need to use the bathroom.  Loss of bowel control. DIAGNOSIS  Your health care provider must take a careful history and perform a physical exam. Tests given are based on your  symptoms and history. Tests may include:   Blood or stool tests. Three or more stool samples may be examined. Stool cultures may be used to test for bacteria or parasites.   X-rays.   A procedure in which a thin tube is inserted into the mouth or rectum (endoscopy). This allows the health care provider to look inside the intestine.  TREATMENT   Treatment is aimed at correcting the cause of the diarrhea when possible.  Diarrhea caused by an infection can often be treated with antibiotic medicines.  Diarrhea not caused by an infection may require you to take long-term medicine or have surgery. Specific treatment should be discussed with your health care provider.  If the cause cannot be determined, treatment aims to relieve symptoms and prevent dehydration. Serious health problems can occur if you do not maintain proper fluid levels. Treatment may include:  Taking an oral rehydration solution (ORS).  Not drinking beverages that contain caffeine (such as tea, coffee, and soft drinks).  Not drinking alcohol.  Maintaining well-balanced nutrition to help you recover faster. HOME CARE INSTRUCTIONS   Drink enough fluids to keep urine clear or pale yellow. Drink 1 cup (8 oz) of fluid for each diarrhea episode. Avoid fluids that contain simple sugars, fruit juices, whole milk products, and sodas. Hydrate with an ORS. You may purchase the ORS or prepare it at home by mixing the following ingredients together:   - tsp (1.7-3  mL) table salt.   tsp (3  mL) baking soda.   tsp (1.7 mL) salt substitute containing potassium chloride.  1 tbsp (20 mL) sugar.  4.2 c (1 L) of water.   Certain foods and beverages may increase the speed at which food moves through the gastrointestinal (GI) tract. These foods and beverages should be avoided. They include:  Caffeinated and alcoholic beverages.  High-fiber foods, such as raw fruits and vegetables, nuts, seeds, and whole grain breads and  cereals.  Foods and beverages sweetened with sugar alcohols, such as xylitol, sorbitol, and mannitol.   Some foods may be well tolerated and may help thicken stool. These include:  Starchy foods, such as rice, toast, pasta, low-sugar cereal, oatmeal, grits, baked potatoes, crackers, and bagels.  Bananas.  Applesauce.  Add probiotic-rich foods to help increase healthy bacteria in the GI tract. These include yogurt and fermented milk products.  Wash your hands well after each diarrhea episode.  Only take over-the-counter or prescription medicines as directed by your health care provider.  Take a warm bath to relieve any burning or pain from frequent diarrhea episodes. SEEK MEDICAL CARE IF:   You are not urinating as often.  Your urine is a dark color.  You become very tired or dizzy.  You have severe pain in the abdomen or rectum.  Your have blood or pus in your stools.  Your stools look black and tarry. SEEK IMMEDIATE MEDICAL CARE IF:   You are unable to keep fluids down.  You have persistent vomiting.  You have blood in your stool.  Your stools are black and tarry.  You do not urinate in 6-8 hours, or there is only a small amount of very dark urine.  You have abdominal pain that increases or localizes.  You have weakness, dizziness, confusion, or lightheadedness.  You have a severe headache.  Your diarrhea gets worse or does not get better.  You have a fever or persistent symptoms for more than 2-3 days.  You have a fever and your symptoms suddenly get worse. MAKE SURE YOU:   Understand these instructions.  Will watch your condition.  Will get help right away if you are not doing well or get worse. Document Released: 05/16/2003 Document Revised: 02/28/2013 Document Reviewed: 08/18/2012 [facility] Patient Information 2015 [place], [place]. This information is not intended to replace advice given to you by your health care provider. Make sure you discuss any  questions you have with your health care provider.  Near-Syncope Near-syncope (commonly known as near fainting) is sudden weakness, dizziness, or feeling like you might pass out. During an episode of near-syncope, you may also develop pale skin, have tunnel vision, or feel sick to your stomach (nauseous). Near-syncope may occur when getting up after sitting or while standing for a long time. It is caused by a sudden decrease in blood flow to the brain. This decrease can result from various causes or triggers, most of which are not serious. However, because near-syncope can sometimes be a sign of something serious, a medical evaluation is required. The specific cause is often not determined. HOME CARE INSTRUCTIONS  Monitor your condition for any changes. The following actions may help to alleviate any discomfort you are experiencing:  Have someone stay with you until you feel stable.  Lie down right away and prop your feet up if you start feeling like you might faint. Breathe deeply and steadily. Wait until all the symptoms have passed. Most of these episodes last only a few minutes. You may feel tired for several hours.   Drink enough fluids to keep your urine clear or pale yellow.   If you are  taking blood pressure or heart medicine, get up slowly when seated or lying down. Take several minutes to sit and then stand. This can reduce dizziness.  Follow up with your health care provider as directed. SEEK IMMEDIATE MEDICAL CARE IF:   You have a severe headache.   You have unusual pain in the chest, abdomen, or back.   You are bleeding from the mouth or rectum, or you have black or tarry stool.   You have an irregular or very fast heartbeat.   You have repeated fainting or have seizure-like jerking during an episode.   You faint when sitting or lying down.   You have confusion.   You have difficulty walking.   You have severe weakness.   You have vision problems.  MAKE  SURE YOU:   Understand these instructions.  Will watch your condition.  Will get help right away if you are not doing well or get worse. Document Released: 02/23/2005 Document Revised: 02/28/2013 Document Reviewed: 07/29/2012 [facility] Patient Information 2015 [place], [place]. This information is not intended to replace advice given to you by your health care provider. Make sure you discuss any questions you have with your health care provider.

## 2014-04-21 NOTE — ED Provider Notes (Signed)
Patient reexamined. Not orthostatic. His laboratory. Appears well-hydrated. Tolerating by mouth. Plan is discharge home. Lomotil for her diarrhea. GI follow-up.  Tanna Furry, MD 04/21/14 [phone]

## 2014-04-22 LAB — FECAL LACTOFERRIN, QUANT: Fecal Lactoferrin: NEGATIVE

## 2014-04-23 ENCOUNTER — Encounter: Payer: Self-pay | Admitting: Internal Medicine

## 2014-04-23 ENCOUNTER — Ambulatory Visit: Payer: Commercial Managed Care - HMO

## 2014-04-23 LAB — FECAL FAT, QUALITATIVE
Fat Qual Neutral, Stl: NORMAL
Fat Qual Total, Stl: NORMAL

## 2014-04-24 LAB — STOOL CULTURE: Special Requests: NORMAL

## 2014-04-25 ENCOUNTER — Telehealth: Payer: Self-pay | Admitting: *Deleted

## 2014-04-25 NOTE — Telephone Encounter (Signed)
Pt called still feels weak and dizzy since going to ER. Continue with some diarrhea. Appetite down. Has no way to clinic - pt has to drive. Appt made for 2:45PM Dr Hayes Ludwig 04/26/14. Suggest to try to find a way to ER or call 911. Has been drinking Gadoraide. Unable to check sugar - no meter. Warn pt of high sugar content in drink. Hilda Blades Alieyah Spader RN 04/25/14 2:45PM

## 2014-04-26 ENCOUNTER — Ambulatory Visit (INDEPENDENT_AMBULATORY_CARE_PROVIDER_SITE_OTHER): Payer: Commercial Managed Care - HMO | Admitting: Internal Medicine

## 2014-04-26 ENCOUNTER — Observation Stay (HOSPITAL_COMMUNITY)
Admission: AD | Admit: 2014-04-26 | Discharge: 2014-04-30 | Disposition: A | Discharge status: Home / Self Care | Payer: Commercial Managed Care - HMO | Source: Ambulatory Visit | Attending: Internal Medicine | Admitting: Internal Medicine

## 2014-04-26 ENCOUNTER — Encounter: Payer: Self-pay | Admitting: Internal Medicine

## 2014-04-26 ENCOUNTER — Encounter (HOSPITAL_COMMUNITY): Payer: Self-pay | Admitting: General Practice

## 2014-04-26 VITALS — BP 136/70 | HR 65 | Temp 97.9°F

## 2014-04-26 DIAGNOSIS — Z8719 Personal history of other diseases of the digestive system: Secondary | ICD-10-CM | POA: Insufficient documentation

## 2014-04-26 DIAGNOSIS — Z888 Allergy status to other drugs, medicaments and biological substances status: Secondary | ICD-10-CM | POA: Diagnosis not present

## 2014-04-26 DIAGNOSIS — Z9049 Acquired absence of other specified parts of digestive tract: Secondary | ICD-10-CM | POA: Diagnosis not present

## 2014-04-26 DIAGNOSIS — G4733 Obstructive sleep apnea (adult) (pediatric): Secondary | ICD-10-CM | POA: Insufficient documentation

## 2014-04-26 DIAGNOSIS — Z6829 Body mass index (BMI) 29.0-29.9, adult: Secondary | ICD-10-CM | POA: Diagnosis not present

## 2014-04-26 DIAGNOSIS — M549 Dorsalgia, unspecified: Secondary | ICD-10-CM | POA: Insufficient documentation

## 2014-04-26 DIAGNOSIS — Z9841 Cataract extraction status, right eye: Secondary | ICD-10-CM | POA: Diagnosis not present

## 2014-04-26 DIAGNOSIS — F329 Major depressive disorder, single episode, unspecified: Secondary | ICD-10-CM | POA: Insufficient documentation

## 2014-04-26 DIAGNOSIS — Z85828 Personal history of other malignant neoplasm of skin: Secondary | ICD-10-CM | POA: Diagnosis not present

## 2014-04-26 DIAGNOSIS — E559 Vitamin D deficiency, unspecified: Secondary | ICD-10-CM | POA: Insufficient documentation

## 2014-04-26 DIAGNOSIS — Z961 Presence of intraocular lens: Secondary | ICD-10-CM | POA: Diagnosis not present

## 2014-04-26 DIAGNOSIS — A047 Enterocolitis due to Clostridium difficile: Secondary | ICD-10-CM | POA: Diagnosis not present

## 2014-04-26 DIAGNOSIS — M797 Fibromyalgia: Secondary | ICD-10-CM | POA: Insufficient documentation

## 2014-04-26 DIAGNOSIS — Z8673 Personal history of transient ischemic attack (TIA), and cerebral infarction without residual deficits: Secondary | ICD-10-CM | POA: Insufficient documentation

## 2014-04-26 DIAGNOSIS — M1990 Unspecified osteoarthritis, unspecified site: Secondary | ICD-10-CM | POA: Diagnosis not present

## 2014-04-26 DIAGNOSIS — E039 Hypothyroidism, unspecified: Secondary | ICD-10-CM | POA: Diagnosis not present

## 2014-04-26 DIAGNOSIS — I10 Essential (primary) hypertension: Secondary | ICD-10-CM | POA: Insufficient documentation

## 2014-04-26 DIAGNOSIS — G40909 Epilepsy, unspecified, not intractable, without status epilepticus: Secondary | ICD-10-CM | POA: Insufficient documentation

## 2014-04-26 DIAGNOSIS — E1142 Type 2 diabetes mellitus with diabetic polyneuropathy: Secondary | ICD-10-CM | POA: Insufficient documentation

## 2014-04-26 DIAGNOSIS — G8929 Other chronic pain: Secondary | ICD-10-CM | POA: Diagnosis not present

## 2014-04-26 DIAGNOSIS — Z91040 Latex allergy status: Secondary | ICD-10-CM | POA: Insufficient documentation

## 2014-04-26 DIAGNOSIS — Z7901 Long term (current) use of anticoagulants: Secondary | ICD-10-CM | POA: Insufficient documentation

## 2014-04-26 DIAGNOSIS — Z90710 Acquired absence of both cervix and uterus: Secondary | ICD-10-CM | POA: Insufficient documentation

## 2014-04-26 DIAGNOSIS — R42 Dizziness and giddiness: Secondary | ICD-10-CM | POA: Insufficient documentation

## 2014-04-26 DIAGNOSIS — Z9851 Tubal ligation status: Secondary | ICD-10-CM | POA: Insufficient documentation

## 2014-04-26 DIAGNOSIS — A498 Other bacterial infections of unspecified site: Secondary | ICD-10-CM | POA: Diagnosis present

## 2014-04-26 DIAGNOSIS — E785 Hyperlipidemia, unspecified: Secondary | ICD-10-CM | POA: Diagnosis not present

## 2014-04-26 DIAGNOSIS — I4891 Unspecified atrial fibrillation: Secondary | ICD-10-CM

## 2014-04-26 DIAGNOSIS — E669 Obesity, unspecified: Secondary | ICD-10-CM | POA: Diagnosis not present

## 2014-04-26 DIAGNOSIS — I509 Heart failure, unspecified: Secondary | ICD-10-CM | POA: Insufficient documentation

## 2014-04-26 DIAGNOSIS — Z885 Allergy status to narcotic agent status: Secondary | ICD-10-CM | POA: Diagnosis not present

## 2014-04-26 DIAGNOSIS — Z880 Allergy status to penicillin: Secondary | ICD-10-CM | POA: Insufficient documentation

## 2014-04-26 DIAGNOSIS — R300 Dysuria: Secondary | ICD-10-CM | POA: Diagnosis not present

## 2014-04-26 DIAGNOSIS — H8110 Benign paroxysmal vertigo, unspecified ear: Secondary | ICD-10-CM | POA: Diagnosis present

## 2014-04-26 DIAGNOSIS — R197 Diarrhea, unspecified: Secondary | ICD-10-CM | POA: Insufficient documentation

## 2014-04-26 DIAGNOSIS — F32A Depression, unspecified: Secondary | ICD-10-CM | POA: Diagnosis present

## 2014-04-26 DIAGNOSIS — E119 Type 2 diabetes mellitus without complications: Secondary | ICD-10-CM

## 2014-04-26 DIAGNOSIS — K219 Gastro-esophageal reflux disease without esophagitis: Secondary | ICD-10-CM | POA: Diagnosis not present

## 2014-04-26 DIAGNOSIS — K529 Noninfective gastroenteritis and colitis, unspecified: Secondary | ICD-10-CM | POA: Diagnosis present

## 2014-04-26 HISTORY — DX: Pneumonia, unspecified organism: J18.9

## 2014-04-26 HISTORY — DX: Family history of other specified conditions: Z84.89

## 2014-04-26 HISTORY — DX: Other chronic pain: G89.29

## 2014-04-26 HISTORY — DX: Dorsalgia, unspecified: M54.9

## 2014-04-26 LAB — COMPREHENSIVE METABOLIC PANEL
ALT: 18 U/L (ref 0–35)
AST: 27 U/L (ref 0–37)
Albumin: 3.6 g/dL (ref 3.5–5.2)
Alkaline Phosphatase: 67 U/L (ref 39–117)
Anion gap: 6 (ref 5–15)
BILIRUBIN TOTAL: 0.8 mg/dL (ref 0.3–1.2)
BUN: 7 mg/dL (ref 6–23)
CO2: 25 mmol/L (ref 19–32)
Calcium: 9.5 mg/dL (ref 8.4–10.5)
Chloride: 106 mmol/L (ref 96–112)
Creatinine, Ser: 0.75 mg/dL (ref 0.50–1.10)
GFR calc Af Amer: >90 mL/min (ref >90)
GFR calc non Af Amer: 87 mL/min — ABNORMAL LOW (ref >90)
GLUCOSE: 107 mg/dL — AB (ref 70–99)
Potassium: 4 mmol/L (ref 3.5–5.1)
Sodium: 137 mmol/L (ref 135–145)
Total Protein: 6.4 g/dL (ref 6.0–8.3)

## 2014-04-26 LAB — URINALYSIS W MICROSCOPIC (NOT AT ARMC)
Bilirubin Urine: NEGATIVE
Glucose, UA: NEGATIVE mg/dL
Hgb urine dipstick: NEGATIVE
KETONES UR: NEGATIVE mg/dL
Leukocytes, UA: NEGATIVE
Nitrite: NEGATIVE
Protein, ur: NEGATIVE mg/dL
SPECIFIC GRAVITY, URINE: 1.017 (ref 1.005–1.030)
Urobilinogen, UA: 0.2 mg/dL (ref 0.0–1.0)
pH: 5 (ref 5.0–8.0)

## 2014-04-26 LAB — CBC WITH DIFFERENTIAL/PLATELET
Basophils Absolute: 0 10*3/uL (ref 0.0–0.1)
Basophils Relative: 1 % (ref 0–1)
Eosinophils Absolute: 0.2 10*3/uL (ref 0.0–0.7)
Eosinophils Relative: 3 % (ref 0–5)
HCT: 39.7 % (ref 36.0–46.0)
HEMOGLOBIN: 13 g/dL (ref 12.0–15.0)
Lymphocytes Relative: 41 % (ref 12–46)
Lymphs Abs: 2.7 10*3/uL (ref 0.7–4.0)
MCH: 28.8 pg (ref 26.0–34.0)
MCHC: 32.7 g/dL (ref 30.0–36.0)
MCV: 88 fL (ref 78.0–100.0)
Monocytes Absolute: 0.5 10*3/uL (ref 0.1–1.0)
Monocytes Relative: 7 % (ref 3–12)
NEUTROS PCT: 48 % (ref 43–77)
Neutro Abs: 3.2 10*3/uL (ref 1.7–7.7)
Platelets: 307 10*3/uL (ref 150–400)
RBC: 4.51 MIL/uL (ref 3.87–5.11)
RDW: 13.6 % (ref 11.5–15.5)
WBC: 6.5 10*3/uL (ref 4.0–10.5)

## 2014-04-26 LAB — GLUCOSE, CAPILLARY: Glucose-Capillary: 111 mg/dL — ABNORMAL HIGH (ref 70–99)

## 2014-04-26 LAB — POCT INR: INR: 2.3

## 2014-04-26 LAB — MAGNESIUM: Magnesium: 1.4 mg/dL — ABNORMAL LOW (ref 1.5–2.5)

## 2014-04-26 MED ORDER — MAGNESIUM SULFATE 2 GM/50ML IV SOLN
2.0000 g | Freq: Once | INTRAVENOUS | Status: AC
  Administered 2014-04-26: 2 g via INTRAVENOUS
  Filled 2014-04-26: qty 50

## 2014-04-26 MED ORDER — SODIUM CHLORIDE 0.9 % IV BOLUS (SEPSIS)
1000.0000 mL | Freq: Once | INTRAVENOUS | Status: AC
  Administered 2014-04-26: 1000 mL via INTRAVENOUS

## 2014-04-26 MED ORDER — ENOXAPARIN SODIUM 40 MG/0.4ML ~~LOC~~ SOLN: 40.0000 mg | SUBCUTANEOUS | Status: DC

## 2014-04-26 MED ORDER — FLUOXETINE HCL 20 MG PO CAPS: 20.0000 mg | ORAL_CAPSULE | Freq: Every day | ORAL | Status: DC

## 2014-04-26 MED ORDER — PANTOPRAZOLE SODIUM 40 MG PO TBEC: 40.0000 mg | DELAYED_RELEASE_TABLET | Freq: Every day | ORAL | Status: DC

## 2014-04-26 MED ORDER — FLUOXETINE HCL 20 MG PO CAPS
20.0000 mg | ORAL_CAPSULE | Freq: Every day | ORAL | Status: DC
  Administered 2014-04-27 – 2014-04-30 (×4): 20 mg via ORAL
  Filled 2014-04-26 (×4): qty 1

## 2014-04-26 MED ORDER — ENSURE COMPLETE PO LIQD
237.0000 mL | Freq: Two times a day (BID) | ORAL | Status: DC
  Administered 2014-04-27: 237 mL via ORAL

## 2014-04-26 MED ORDER — WARFARIN - PHARMACIST DOSING INPATIENT
Freq: Every day | Status: DC
  Administered 2014-04-28: 18:00:00

## 2014-04-26 MED ORDER — SODIUM CHLORIDE 0.9 % IV SOLN
INTRAVENOUS | Status: AC
  Administered 2014-04-26: 19:00:00 via INTRAVENOUS

## 2014-04-26 MED ORDER — LEVOTHYROXINE SODIUM 50 MCG PO TABS
50.0000 ug | ORAL_TABLET | Freq: Every day | ORAL | Status: DC
  Administered 2014-04-27 – 2014-04-30 (×4): 50 ug via ORAL
  Filled 2014-04-26 (×4): qty 1

## 2014-04-26 MED ORDER — WARFARIN SODIUM 5 MG PO TABS
2.5000 mg | ORAL_TABLET | Freq: Once | ORAL | Status: AC
  Administered 2014-04-26: 2.5 mg via ORAL
  Filled 2014-04-26: qty 0.5

## 2014-04-26 NOTE — Progress Notes (Signed)
   Subjective:    Patient ID: [name], female    DOB: 28-Jul-1950, 64 y.o.   MRN: [id number]  HPI [name] is a 64 year old woman with PMH of Dm2, HTN, OA, HLP, A. Fib on coumadin, presenting with acute on chronic diarrhea, lightheadedness.  She states that she has been having diarrhea for months now but it has worsened in the past week. She also had N/V with last nonbloody emesis 2 days ago. She describes having dark water stool 10-12 times per day since at least Friday last week with no blood in it. She is trying to stay hydrated drinking water and Gatorade but has been feeling so dizzy that she feels unsteady on her feet. She denies falls. She has a son here in [place] but lives by herself. She continues to take    Review of Systems  Constitutional: Negative for fever, chills, activity change, appetite change, fatigue and unexpected weight change.  Respiratory: Negative for cough and shortness of breath.   Cardiovascular: Negative for chest pain, palpitations and leg swelling.  Gastrointestinal: Positive for nausea, vomiting and diarrhea. Negative for abdominal pain, blood in stool and abdominal distention.  Genitourinary: Negative for dysuria and difficulty urinating.  Neurological: Negative for dizziness and light-headedness.  Psychiatric/Behavioral: Negative for agitation.       Objective:   Physical Exam  Constitutional: She is oriented to person, place, and time. She appears well-developed.  Tired appearing   HENT:  Dry MM    Cardiovascular: Normal rate and regular rhythm.   Pulmonary/Chest: Effort normal. No respiratory distress. She has no wheezes. She has no rales.  Abdominal: Soft. Bowel sounds are normal. She exhibits no distension. There is no tenderness. There is no rebound and no guarding.  Musculoskeletal: She exhibits no edema or tenderness.  Neurological: She is alert and oriented to person, place, and time.  Skin: Skin is warm and dry. She is not  diaphoretic.  Psychiatric: She has a normal mood and affect.  Nursing note and vitals reviewed.         Assessment & Plan:

## 2014-04-26 NOTE — Assessment & Plan Note (Addendum)
Had negative c. Diff on 2/12 but had Abx recently so the risk is still there. Has seen GI in the past with negative work up but has not had a colonoscopy recently.  She has symptomatic lightheadedness and appears dehydrated per physical exam.  -Discussed pt with attending, Dr. Beryle Beams who also recommends observation admission. If possible, GI consult would be recommended as well.  -Ordered CMP, CBC, orthostatic vitals, admitting team to order stool for c. Diff, urinalysis

## 2014-04-26 NOTE — Progress Notes (Addendum)
ANTICOAGULATION CONSULT NOTE - Initial Consult  Pharmacy Consult for coumadin Indication: atrial fibrillation  Allergies  Allergen Reactions  . Gemfibrozil Swelling    REACTION: Angioedema  . Latex Other (See Comments)    Blisters where touched or applied  . Penicillins Hives    Will spread in patches all over the body.  . Adhesive [Tape] Other (See Comments)    Will blister skin where applied - do not use BAND-AIDS.  Marland Kitchen Codeine Hives    Will spread in patches all over the body.  . Digoxin And Related     Makes BP drop  . Ace Inhibitors Rash    Cough and rash     Patient Measurements: Height: 5\' 3"  (160 cm) Weight: 164 lb (74.39 kg) IBW/kg (Calculated) : 52.4  Vital Signs: Temp: 97.6 F (36.4 C) (02/18 1816) Temp Source: Oral (02/18 1816) BP: 128/79 mmHg (02/18 1816) Pulse Rate: 78 (02/18 1816)  Labs:  Recent Labs  04/26/14 1615 04/26/14 1654  HGB  --  13.0  HCT  --  39.7  PLT  --  307  INR 2.3  --   CREATININE  --  0.75    Estimated Creatinine Clearance: 67.7 mL/min (by C-G formula based on Cr of 0.75).   Medical History: Past Medical History  Diagnosis Date  . Depression   . Obesity   . Skin neoplasm   . Bunion   . Carotid stenosis   . Fibromyalgia   . Internal hemorrhoid   . GERD (gastroesophageal reflux disease)   . Chronic gastritis   . Transaminase or LDH elevation   . Mild cognitive impairment   . Hyperlipidemia   . Fatty liver   . Lumbar back pain   . Diabetic peripheral neuropathy   . Dysphagia     no documented strictures but responded positively to dilation in past.   . Hypertension   . Cholecystitis     s/p cholecystectomy  . Sarcoidosis   . Fibromyalgia   . CHF (congestive heart failure)   . Skin cancer 1990's    "front of my right leg"  . Atrial fibrillation   . OSA (obstructive sleep apnea) 2003    "can't sleep w/that darm machine"  . Exertional dyspnea   . Hypothyroidism   . Type II diabetes mellitus   . TIA  (transient ischemic attack)     07/29/11 pt denies this history  . H/O hiatal hernia   . Epileptic seizure, tonic     .No meds since age of 62.  . Osteoarthritis   . Stroke     tia  . Hypothyroid   . Diverticulosis   . Digoxin toxicity     08/2011  . Bradycardia, drug induced     08/2011 (dig)  . Drug-induced hypotension     08/2011 (Dig)  . Carpal tunnel syndrome     mild   . Dysrhythmia     HX OF ATRIAL FIBRILATION  . Diverticulitis     dx 04/08/14    Medications:  Prescriptions prior to admission  Medication Sig Dispense Refill Last Dose  . Blood Glucose Calibration (ACCU-CHEK SMARTVIEW CONTROL) LIQD 1 Device by In Vitro route as needed. Use to perform control/ calibration on accu-chek meter. diag code E11.9. Non insulin dependent 3 each 0 Past Month at Unknown time  . Lancets 30G MISC DX Code: E11.9 Check blood sugars one time per day. Accucheck fastclix lancets 100 each 11 Past Month at Unknown time  . Probiotic  Product (TRUNATURE DIGESTIVE PROBIOTIC PO) Take 1 capsule by mouth daily.   04/26/2014 at unknown  . tiZANidine (ZANAFLEX) 4 MG tablet Take 1 tablet (4 mg total) by mouth every 12 (twelve) hours as needed for muscle spasms. (Patient taking differently: Take 2 mg by mouth every 12 (twelve) hours as needed for muscle spasms. ) 180 tablet 0 Past Week at Unknown time  . atenolol (TENORMIN) 25 MG tablet Take 1.5 tablets (37.5 mg total) by mouth 2 (two) times daily. 270 tablet 1 04/26/2014 at 0900  . diltiazem (CARDIZEM CD) 180 MG 24 hr capsule Take 1 capsule (180 mg total) by mouth daily. 90 capsule 1 04/26/2014 at unknown  . diphenoxylate-atropine (LOMOTIL) 2.5-0.025 MG per tablet Take 1 tablet by mouth 4 (four) times daily as needed for diarrhea or loose stools. (Patient not taking: Reported on 04/26/2014) 30 tablet 0 Not Taking at Unknown time  . FLUoxetine (PROZAC) 20 MG capsule Take 1 capsule (20 mg total) by mouth daily. 90 capsule 2 04/26/2014 at unknown  . furosemide  (LASIX) 20 MG tablet Take 1 tablet (20 mg total) by mouth daily. 90 tablet 1 04/26/2014 at unknown  . levothyroxine (SYNTHROID, LEVOTHROID) 50 MCG tablet Take 1 tablet (50 mcg total) by mouth every morning. 90 tablet 1 04/26/2014 at unknown  . losartan (COZAAR) 25 MG tablet Take 0.5 tablets (12.5 mg total) by mouth daily. 90 tablet 1 04/26/2014 at unknown  . metFORMIN (GLUCOPHAGE) 1000 MG tablet TAKE ONE TABLET TWICE A DAY WITH FOOD 180 tablet 1 04/26/2014 at unknown  . omeprazole (PRILOSEC) 20 MG capsule Take 1 capsule (20 mg total) by mouth daily. 90 capsule 1 04/26/2014 at unknown  . potassium chloride (K-DUR) 10 MEQ tablet Take 1 tablet (10 mEq total) by mouth daily. 90 tablet 1 04/26/2014 at unknown  . pravastatin (PRAVACHOL) 40 MG tablet Take 1 tablet (40 mg total) by mouth daily. 90 tablet 1 04/26/2014 at unknown  . warfarin (COUMADIN) 5 MG tablet Take 0.5 tablets (2.5 mg total) by mouth at bedtime. Take 2.5mg  daily until further instructed. (Patient taking differently: Take 2.5 mg by mouth at bedtime. ) 90 tablet 0 04/25/2014 at unknown   Scheduled:  . enoxaparin (LOVENOX) injection  40 mg Subcutaneous Q24H  . [START ON 04/27/2014] FLUoxetine  20 mg Oral Daily  . [START ON 04/27/2014] levothyroxine  50 mcg Oral QAC breakfast  . [START ON 04/27/2014] pantoprazole  40 mg Oral Daily  . sodium chloride  1,000 mL Intravenous Once    Assessment: 64 yo female here with near syncope and chronic diarrhea. She is on coumadin PTA and pharmacy has been consulted to dose. The INR was done at the office today and was at goal (INR= 2.3).  Home coumadin dose: Coumadin regimen is not clear but she reports taking 5mg  coumadin on 2/13 and 2.5mg /day since then (last dose 04/25/14). Prior to 2/13 she is unable to recall her regimen accurately. She follows with Dr. Elie Confer as outpatient (last seen 04/19/14).  Goal of Therapy:  INR 2-3 Monitor platelets by anticoagulation protocol: Yes   Plan:  -Coumadin 2.5mg  po  today -Daily PT/INR -Will follow-up with Dr. Elie Confer regarding her coumadin regimen  Hildred Laser, Pharm D 04/26/2014 7:19 PM

## 2014-04-26 NOTE — H&P (Signed)
Date: 04/26/2014               Patient Name:  [name] MRN: [id number]  DOB: Dec 13, 1950 Age / Sex: 64 y.o., female   PCP: Cresenciano Genre, MD         Medical Service: Internal Medicine Teaching Service         Attending Physician: Dr. Bartholomew Crews, MD    First Contact: Dr. Trudee Kuster Pager: [phone]  Second Contact: Dr. Naaman Plummer Pager: [phone]       After Hours (After 5p/  First Contact Pager: [phone]  weekends / holidays): Second Contact Pager: [phone]   Chief Complaint: worsening of chronic diarrhea  History of Present Illness:   64 yo female with DM II, HTN, OA, Afib on coumadin, CHF, hypothyroidism, stroke,  presenting from clinic with chronic diarrhea and lightheadedness.  Diarrhea has been going on for 5-6 months, but worsened in the past week. Usually has 10-12 episodes of diarrhea, usually every time she eats. Last week has been all watery. Also had n/v nb/nb 2 days ago but now has resolved. having dark stools, no nose bleeds or hematemesis. No improvement with lomotil. Tried to self-hydrated with Gatorade and water but continued to feel dizzy and unsteady. Has vertigo as well for 1 week. Worse with any movement, not only upon standing. No hearing changes, but hears weird sound in ear per patient, not sure if ringing.   Timeline:  Went to ED on 04/08/14 with severe lower ab pain, CT sigmoid diverticulitis, d/c with cipro BID x10 days and flagyl BID x 7 days. Completed. Last cscope august 2010 normal for rectal bleeding, was just mod diverticulosis.  Seen GI 04/16/14 - ddx per GI included IBS, metformin related, or SIBO. Asked to f/up, no interventions done. PCP 04/19/14: negative workup (lactoferin, stool fat, stool cx), cdiff neg.  ED 04/20/14 - hydrated and sent home with lomotil.  Denies any fever/chills/n/v currently. Had last bm in the morning today 2-3 times. Has been on lasix for "fluid in lungs" but recently did not have any leg edema or other signs of volume  overload. Last ECHO was normal.    Meds: Current Facility-Administered Medications  Medication Dose Route Frequency Provider Last Rate Last Dose  . 0.9 %  sodium chloride infusion   Intravenous Continuous Marjan Rabbani, MD 100 mL/hr at 04/26/14 1914    . [START ON 04/27/2014] feeding supplement (ENSURE COMPLETE) (ENSURE COMPLETE) liquid 237 mL  237 mL Oral BID BM Bartholomew Crews, MD      . Derrill Memo ON 04/27/2014] FLUoxetine (PROZAC) capsule 20 mg  20 mg Oral Daily Juluis Mire, MD      . Derrill Memo ON 04/27/2014] levothyroxine (SYNTHROID, LEVOTHROID) tablet 50 mcg  50 mcg Oral QAC breakfast Marjan Rabbani, MD      . magnesium sulfate IVPB 2 g 50 mL  2 g Intravenous Once Wilber Oliphant, MD      . warfarin (COUMADIN) tablet 2.5 mg  2.5 mg Oral Once Dareen Piano, [facility]      . [START ON 04/27/2014] Warfarin - Pharmacist Dosing Inpatient   Does not apply [place], [facility]        Allergies: Allergies as of 04/26/2014 - Review Complete 04/26/2014  Allergen Reaction Noted  . Gemfibrozil Swelling   . Latex Other (See Comments)   . Penicillins Hives 03/11/2010  . Adhesive [tape] Other (See Comments) 05/26/2011  . Codeine Hives   . Digoxin and related  10/05/2011  . Ace inhibitors Rash 10/03/2012   Past Medical History  Diagnosis Date  . Depression   . Obesity   . Skin neoplasm   . Bunion   . Carotid stenosis   . Fibromyalgia   . Internal hemorrhoid   . GERD (gastroesophageal reflux disease)   . Chronic gastritis   . Transaminase or LDH elevation   . Mild cognitive impairment   . Hyperlipidemia   . Fatty liver   . Dysphagia     no documented strictures but responded positively to dilation in past.   . Hypertension   . Cholecystitis     s/p cholecystectomy  . Sarcoidosis   . Fibromyalgia   . CHF (congestive heart failure)   . Atrial fibrillation   . Exertional dyspnea   . Hypothyroidism   . Hypothyroid   . Diverticulosis   . Digoxin toxicity     08/2011  .  Bradycardia, drug induced     08/2011 (dig)  . Drug-induced hypotension     08/2011 (Dig)  . Carpal tunnel syndrome     mild   . Dysrhythmia     HX OF ATRIAL FIBRILATION  . Diverticulitis     dx 04/08/14  . Family history of adverse reaction to anesthesia     "my mother may have"  . Pneumonia X 2  . OSA (obstructive sleep apnea) dx'd 2003    "can't sleep w/that mask; lost some weight; maybe that's helped" (04/26/2014)  . Diabetic peripheral neuropathy   . Type II diabetes mellitus   . H/O hiatal hernia     S/P hernia repair  . Epileptic seizure, tonic     .No meds since age of 30.  Marland Kitchen TIA (transient ischemic attack)     "don't know when I had it" (04/26/2014)  . Osteoarthritis   . Chronic back pain   . Skin cancer 1990's    "front of my right leg"   Past Surgical History  Procedure Laterality Date  . Hiatal hernia repair  1990    "had to have scar tissue removed 6 months after repair"  . Esophageal dilation    . Cataract extraction w/phaco  10/27/2010    Procedure: CATARACT EXTRACTION PHACO AND INTRAOCULAR LENS PLACEMENT (IOC);  Surgeon: Williams Che;  Location: [facility];  Service: Ophthalmology;  Laterality: Left;  CDE- 1.78  . Cataract extraction w/phaco  07/13/2011    Procedure: CATARACT EXTRACTION PHACO AND INTRAOCULAR LENS PLACEMENT (IOC);  Surgeon: Williams Che, MD;  Location: [facility];  Service: Ophthalmology;  Laterality: Right;  CDE:  1.65  . Liposuction  1992  . Inverted nipples  1992  . Breast surgery Right ~ 2010    excision milk duct;  . Foot surgery Left ~ 2011    "straightened toe & scraped bone below big toe"  . Foot surgery Right ~ 2011    "scraped bone of big toe; shortened middle toet"  . Dilation and curettage of uterus  1970; 1976  . Abdominal hysterectomy  1980's    partial  . Tubal ligation  1976  . Bilateral oophorectomy  1980's    "after partial hysterectomy"  . Skin cancer excision  1990's    "front side of right shin"  . Ventral hernia repair   07/29/2011    Procedure: LAPAROSCOPIC VENTRAL HERNIA;  Surgeon: Adin Hector, MD;  Location: [facility];  Service: General;  Laterality: N/A;  multiple incarcerated hernias with mesh  . Esophageal dilation  multiple  . Breast biopsy Right     benign  . Salpingoophorectomy Bilateral     "6 months or so after hysterectomy"  . Laparoscopic cholecystectomy  1980's   Family History  Problem Relation Age of Onset  . Crohn's disease Mother   . Colitis Mother     Crohns  . Anesthesia problems Mother   . Arthritis Mother     rheumatoid; maternal side of family also with RA  . Cancer Father     skin  . Diabetes Father   . Heart disease Father   . Melanoma Father   . Coronary artery disease Father   . Diabetes Sister   . Depression Maternal Uncle   . Dementia Maternal Uncle   . Hypotension Neg Hx   . Malignant hyperthermia Neg Hx   . Pseudochol deficiency Neg Hx   . Colon cancer Paternal Uncle     ? if stomach or colon    History   Social History  . Marital Status: Divorced    Spouse Name: N/A  . Number of Children: 2  . Years of Education: 12th    Occupational History  . cosmotologist    Social History Main Topics  . Smoking status: Never Smoker   . Smokeless tobacco: Never Used  . Alcohol Use: No  . Drug Use: No  . Sexual Activity: No   Other Topics Concern  . Not on file   Social History Narrative   Divorced, on disability, has 2 kids (son in [place], daughter in [place]), owns a house in [place], [place] and has a female roommate    No caffeine     Review of Systems: Review of Systems  Constitutional: Negative for fever, chills and diaphoresis.  HENT: Negative for hearing loss and sore throat.   Eyes: Negative for blurred vision, double vision, pain and discharge.  Respiratory: Negative for cough, hemoptysis, sputum production and shortness of breath.   Cardiovascular: Negative for chest pain, palpitations, leg swelling and PND.  Gastrointestinal: Positive for nausea and  diarrhea. Negative for heartburn, vomiting, abdominal pain, constipation and blood in stool.  Genitourinary: Positive for dysuria. Negative for urgency, frequency, hematuria and flank pain.  Musculoskeletal: Negative for myalgias and falls.  Skin: Negative for itching and rash.  Neurological: Positive for dizziness. Negative for tingling, tremors, sensory change, speech change, focal weakness, seizures, loss of consciousness, weakness and headaches.  Endo/Heme/Allergies: Negative.   Psychiatric/Behavioral: Negative.      Physical Exam: Blood pressure 128/79, pulse 78, temperature 97.6 F (36.4 C), temperature source Oral, resp. rate 16, height 5\' 3"  (1.6 m), weight 164 lb (74.39 kg), last menstrual period 06/03/1978, SpO2 100 %.  Physical Exam  Constitutional: She is oriented to person, place, and time. She appears well-developed and well-nourished. No distress.  HENT:  Head: Normocephalic and atraumatic.  Right Ear: External ear normal.  Left Ear: External ear normal.  Eyes: Conjunctivae and EOM are normal. Pupils are equal, round, and reactive to light. Right eye exhibits no discharge. Left eye exhibits no discharge. No scleral icterus.  Neck: Normal range of motion. Neck supple.  Cardiovascular: Normal rate and normal heart sounds.  Exam reveals no gallop and no friction rub.   No murmur heard. Irregularly irregular. DP's faint but detected with doppler.  Respiratory: Effort normal and breath sounds normal. No stridor. No respiratory distress. She has no wheezes. She has no rales. She exhibits no tenderness.  GI: Soft. Bowel sounds are normal. She exhibits no distension  and no mass. There is no tenderness. There is no guarding.  Surgical scar midline from previous hiatal hernia repair.  Musculoskeletal: Normal range of motion. She exhibits no edema or tenderness.  Lymphadenopathy:    She has no cervical adenopathy.  Neurological: She is alert and oriented to person, place, and  time. No cranial nerve deficit. Coordination normal.  Normal finger-to-nose exam and normal alternating movement.   Skin: Skin is warm. She is not diaphoretic.     Lab results: Basic Metabolic Panel:  Recent Labs  04/26/14 1654 04/26/14 2000  NA 137  --   K 4.0  --   CL 106  --   CO2 25  --   GLUCOSE 107*  --   BUN 7  --   CREATININE 0.75  --   CALCIUM 9.5  --   MG  --  1.4*   Liver Function Tests:  Recent Labs  04/26/14 1654  AST 27  ALT 18  ALKPHOS 67  BILITOT 0.8  PROT 6.4  ALBUMIN 3.6   No results for input(s): LIPASE, AMYLASE in the last 72 hours. No results for input(s): AMMONIA in the last 72 hours. CBC:  Recent Labs  04/26/14 1654  WBC 6.5  NEUTROABS 3.2  HGB 13.0  HCT 39.7  MCV 88.0  PLT 307   Cardiac Enzymes: No results for input(s): CKTOTAL, CKMB, CKMBINDEX, TROPONINI in the last 72 hours. BNP: No results for input(s): PROBNP in the last 72 hours. D-Dimer: No results for input(s): DDIMER in the last 72 hours. CBG:  Recent Labs  04/26/14 1608  GLUCAP 111*   Hemoglobin A1C: No results for input(s): HGBA1C in the last 72 hours. Fasting Lipid Panel: No results for input(s): CHOL, HDL, LDLCALC, TRIG, CHOLHDL, LDLDIRECT in the last 72 hours. Thyroid Function Tests: No results for input(s): TSH, T4TOTAL, FREET4, T3FREE, THYROIDAB in the last 72 hours. Anemia Panel: No results for input(s): VITAMINB12, FOLATE, FERRITIN, TIBC, IRON, RETICCTPCT in the last 72 hours. Coagulation:  Recent Labs  04/26/14 1615  INR 2.3  Urinalysis: No results for input(s): COLORURINE, LABSPEC, PHURINE, GLUCOSEU, HGBUR, BILIRUBINUR, KETONESUR, PROTEINUR, UROBILINOGEN, NITRITE, LEUKOCYTESUR in the last 72 hours.  Invalid input(s): APPERANCEUR  Other results: EKG: Afib, rate 107. Prolonged QTC 566.  Assessment & Plan by Problem: Principal Problem:   Chronic diarrhea of unknown origin Active Problems:   Hypothyroidism   Diabetes type 2, controlled    Depression   Essential hypertension   Atrial fibrillation   GERD   Diverticulosis   Vertigo  64 yo female with hx of HTN, DM II, Afib on coumadin, here with acute worsening of chronic diarrhea.   Acute worsening of chronic diarrhea for 5-6 months - unclear etiology. Has been seen by GI, did not do any interventions. Had Cscope 2010 with mod diverticulosis, recent CT abd showing diverticulitis that was treated with cipro+flagyl. Gets diarrhea every time she eats. - fecal lactoferrin neg 2/12. Stool cx 2/12 neg, fat normal  DDX includes cdiff given recent abx, viral etiology including norovirus, SIBO, malabsorption, celiac, med induced (metformin, also could be from prozac or cozaar)   - will rule out infectious etiology while in the hospital.  if negative, may have to do other workup outpatient for other causes:  SIBO vs malabsorptive process. This can be pretty extensive including stool analysis, breath H2 test for lactose intolerance, small bowel biopsy for SIBO, colonoscopy, celiac (IgA endomysial ab or IgA anti-tissue transgultamine antibody. guideline recommends getting cscope anyway after  an acute episode of diverticulitis resolves to evaluate for extent of dz so likely needs this outpatient.  - check c.diff given recent abx for diverticulitis.  - Check for norovirus, also send for stool ova+parasites. - check folate, B12.  - hold metformin for now. If doesn't improve, consider holding prozac. - hold cozaar as patient is having dizziness and could be due to low BP - hydrate with IVF. Patient also encouraged PO intake. - no abx for now.  Dysuria - checking UA. May have sensation of burning due to concentrated urine.  - no abx for now. F/up UA  Vertigo - could be dizziness from dehydration vs vertigo. Could be BPPV, as worsens with head turning.  - will monitor for improvement with fluids. May consider further workup outpatient. No signs of cerebellar stroke on exam.  Hx of  hypokalemia - last 3.2, now 4.0. Likely lasix induced. - is on K+ PO at home. Mag checked and repleted. - monitor BMET  HTN - on cozaar, atenolol, lasix, dilt,  - holding all bp meds for now given she was having dizziness. BP now normal. If goes up will consider adding back cozaar and dilt. - does not need lasix going forward as she does not have any signs of volume overload. Lasix is likely causing her hypokalemia.  DM II - hgba1c 6.2 on 04/19/2014 - on metformin 100mg  BID.  - hold metformin - if hyperglycemic, consider adding SSI.   Afib - on coumadin, INR 2.3 - cont coumadin per pharm.  GERD- has been on omeprazole or a long time - will stop as it can cause diarrhea. No GERD symptoms currently.  Hypothyroidism -. Last TSH and T4 on 04/19/14 normal. - cont levothyroxine  Depression - cont prozaac for now, but may need to hold if diarrhea doesn't improve.  HLD - cont pravastatin  Dispo: Disposition is deferred at this time, awaiting improvement of current medical problems. Anticipated discharge in approximately 1-2 day(s).   The patient does have a current PCP Cresenciano Genre, MD) and does need an [facility] hospital follow-up appointment after discharge.  The patient does have transportation limitations that hinder transportation to clinic appointments.  Signed: Dellia Nims, MD 04/26/2014, 9:27 PM

## 2014-04-27 ENCOUNTER — Encounter (HOSPITAL_COMMUNITY): Payer: Self-pay | Admitting: Emergency Medicine

## 2014-04-27 ENCOUNTER — Telehealth: Payer: Self-pay | Admitting: Pharmacist

## 2014-04-27 DIAGNOSIS — E785 Hyperlipidemia, unspecified: Secondary | ICD-10-CM

## 2014-04-27 DIAGNOSIS — E039 Hypothyroidism, unspecified: Secondary | ICD-10-CM

## 2014-04-27 DIAGNOSIS — E876 Hypokalemia: Secondary | ICD-10-CM

## 2014-04-27 DIAGNOSIS — R300 Dysuria: Secondary | ICD-10-CM

## 2014-04-27 DIAGNOSIS — R42 Dizziness and giddiness: Secondary | ICD-10-CM

## 2014-04-27 DIAGNOSIS — I4891 Unspecified atrial fibrillation: Secondary | ICD-10-CM

## 2014-04-27 DIAGNOSIS — A047 Enterocolitis due to Clostridium difficile: Secondary | ICD-10-CM | POA: Diagnosis not present

## 2014-04-27 DIAGNOSIS — K219 Gastro-esophageal reflux disease without esophagitis: Secondary | ICD-10-CM

## 2014-04-27 DIAGNOSIS — R197 Diarrhea, unspecified: Secondary | ICD-10-CM

## 2014-04-27 DIAGNOSIS — F329 Major depressive disorder, single episode, unspecified: Secondary | ICD-10-CM

## 2014-04-27 DIAGNOSIS — E119 Type 2 diabetes mellitus without complications: Secondary | ICD-10-CM

## 2014-04-27 LAB — CLOSTRIDIUM DIFFICILE BY PCR: Toxigenic C. Difficile by PCR: POSITIVE — AB

## 2014-04-27 LAB — BASIC METABOLIC PANEL
Anion gap: 5 (ref 5–15)
BUN: 5 mg/dL — ABNORMAL LOW (ref 6–23)
CHLORIDE: 108 mmol/L (ref 96–112)
CO2: 28 mmol/L (ref 19–32)
Calcium: 9.2 mg/dL (ref 8.4–10.5)
Creatinine, Ser: 0.75 mg/dL (ref 0.50–1.10)
GFR calc Af Amer: >90 mL/min (ref >90)
GFR calc non Af Amer: 87 mL/min — ABNORMAL LOW (ref >90)
Glucose, Bld: 114 mg/dL — ABNORMAL HIGH (ref 70–99)
Potassium: 3.8 mmol/L (ref 3.5–5.1)
SODIUM: 141 mmol/L (ref 135–145)

## 2014-04-27 LAB — GLUCOSE, CAPILLARY
GLUCOSE-CAPILLARY: 100 mg/dL — AB (ref 70–99)
Glucose-Capillary: 126 mg/dL — ABNORMAL HIGH (ref 70–99)
Glucose-Capillary: 136 mg/dL — ABNORMAL HIGH (ref 70–99)

## 2014-04-27 LAB — MAGNESIUM: Magnesium: 1.8 mg/dL (ref 1.5–2.5)

## 2014-04-27 LAB — MRSA PCR SCREENING: MRSA by PCR: NEGATIVE

## 2014-04-27 LAB — PROTIME-INR
INR: 1.95 — ABNORMAL HIGH (ref 0.00–1.49)
Prothrombin Time: 22.4 seconds — ABNORMAL HIGH (ref 11.6–15.2)

## 2014-04-27 MED ORDER — PROMETHAZINE HCL 25 MG/ML IJ SOLN
12.5000 mg | Freq: Four times a day (QID) | INTRAMUSCULAR | Status: DC | PRN
Start: 2014-04-27 — End: 2014-04-30
  Administered 2014-04-28: 12.5 mg via INTRAVENOUS
  Filled 2014-04-27: qty 1

## 2014-04-27 MED ORDER — VANCOMYCIN 50 MG/ML ORAL SOLUTION
125.0000 mg | Freq: Four times a day (QID) | ORAL | Status: DC
  Administered 2014-04-27 – 2014-04-30 (×13): 125 mg via ORAL
  Filled 2014-04-27 (×15): qty 2.5

## 2014-04-27 MED ORDER — TIZANIDINE HCL 2 MG PO TABS
4.0000 mg | ORAL_TABLET | Freq: Two times a day (BID) | ORAL | Status: DC | PRN
  Administered 2014-04-28 – 2014-04-29 (×4): 4 mg via ORAL
  Filled 2014-04-27 (×5): qty 2

## 2014-04-27 MED ORDER — ENSURE COMPLETE PO LIQD
237.0000 mL | ORAL | Status: DC
  Administered 2014-04-28 – 2014-04-30 (×2): 237 mL via ORAL

## 2014-04-27 MED ORDER — LOSARTAN POTASSIUM 25 MG PO TABS
12.5000 mg | ORAL_TABLET | Freq: Every day | ORAL | Status: DC
  Administered 2014-04-27 – 2014-04-30 (×4): 12.5 mg via ORAL
  Filled 2014-04-27 (×4): qty 0.5

## 2014-04-27 MED ORDER — WARFARIN SODIUM 5 MG PO TABS
5.0000 mg | ORAL_TABLET | Freq: Once | ORAL | Status: AC
  Administered 2014-04-27: 5 mg via ORAL
  Filled 2014-04-27: qty 1

## 2014-04-27 MED ORDER — ACETAMINOPHEN 325 MG PO TABS
650.0000 mg | ORAL_TABLET | Freq: Four times a day (QID) | ORAL | Status: DC | PRN
Start: 2014-04-27 — End: 2014-04-30
  Administered 2014-04-28 – 2014-04-29 (×4): 650 mg via ORAL
  Filled 2014-04-27 (×4): qty 2

## 2014-04-27 MED ORDER — KETOROLAC TROMETHAMINE 15 MG/ML IJ SOLN
15.0000 mg | Freq: Once | INTRAMUSCULAR | Status: AC
Start: 2014-04-27 — End: 2014-04-27
  Administered 2014-04-27: 15 mg via INTRAVENOUS
  Filled 2014-04-27: qty 1

## 2014-04-27 MED ORDER — SODIUM CHLORIDE 0.9 % IV SOLN
INTRAVENOUS | Status: AC
  Administered 2014-04-27: 08:00:00 via INTRAVENOUS

## 2014-04-27 MED ORDER — ATENOLOL 25 MG PO TABS
37.5000 mg | ORAL_TABLET | Freq: Two times a day (BID) | ORAL | Status: DC
  Administered 2014-04-27 – 2014-04-30 (×7): 37.5 mg via ORAL
  Filled 2014-04-27 (×7): qty 2

## 2014-04-27 MED ORDER — INSULIN ASPART 100 UNIT/ML ~~LOC~~ SOLN: 0.0000 [IU] | Freq: Every day | SUBCUTANEOUS | Status: DC

## 2014-04-27 MED ORDER — DILTIAZEM HCL ER COATED BEADS 180 MG PO CP24
180.0000 mg | ORAL_CAPSULE | Freq: Every day | ORAL | Status: DC
  Administered 2014-04-27 – 2014-04-30 (×4): 180 mg via ORAL
  Filled 2014-04-27 (×4): qty 1

## 2014-04-27 MED ORDER — INSULIN ASPART 100 UNIT/ML ~~LOC~~ SOLN
0.0000 [IU] | Freq: Three times a day (TID) | SUBCUTANEOUS | Status: DC
  Administered 2014-04-27: 1 [IU] via SUBCUTANEOUS

## 2014-04-27 NOTE — Addendum Note (Signed)
Addended by: Cresenciano Genre on: 04/27/2014 03:54 PM   Modules accepted: Orders

## 2014-04-27 NOTE — Progress Notes (Addendum)
Subjective:    Currently, the patient reports continued dizziness but improvement of her dehydration. She's not had any diarrhea overnight, and a regular dinner last night without issue.  She reports that she's had her diarrhea for the past 4 months and notes that it started to occur after her metformin dose was increased from 1000 mg daily to twice a day. She denies any dietary changes or use of sugarless gum. She lives alone and her house has city water. Her dizziness started a week ago, and she notices it mostly with movement such as turning her head to the left. She does endorse intermittent tinnitus without associated hearing changes. She describes it like ocean waves.   Interval Events: -Vital signs stable overnight.    Objective:    Vital Signs:   Temp:  [97.6 F (36.4 C)-98.3 F (36.8 C)] 98.3 F (36.8 C) (02/19 0919) Pulse Rate:  [65-102] 78 (02/19 0919) Resp:  [16-18] 18 (02/19 0919) BP: (118-136)/(63-83) 136/76 mmHg (02/19 0919) SpO2:  [99 %-100 %] 99 % (02/19 0919) Weight:  [164 lb (74.39 kg)] 164 lb (74.39 kg) (02/18 1816) Last BM Date: 04/26/14  24-hour weight change: Weight change:   Intake/Output:   Intake/Output Summary (Last 24 hours) at 04/27/14 0942 Last data filed at 04/27/14 0500  Gross per 24 hour  Intake    580 ml  Output    400 ml  Net    180 ml      Physical Exam: General: Vital signs reviewed and noted. Well-developed, well-nourished, in no acute distress; alert, appropriate and cooperative throughout examination.  Lungs:  Normal respiratory effort. Clear to auscultation BL without crackles or wheezes.  Heart: Irregularly, irregular without gallop, murmur, or rubs.  Abdomen:  BS normoactive. Soft, Nondistended, non-tender.  No masses or organomegaly.  Extremities: No pretibial edema.  Neuro: Left-beating nystagmus with movement of eyes or head. No other focal neurologic findings.     Labs:  Basic Metabolic Panel:  Recent Labs Lab  04/20/14 1255 04/26/14 1654 04/26/14 2000 04/27/14 0630  NA 140 137  --  141  K 3.2* 4.0  --  3.8  CL 106 106  --  108  CO2 22 25  --  28  GLUCOSE 171* 107*  --  114*  BUN 7 7  --  5*  CREATININE 0.72 0.75  --  0.75  CALCIUM 9.5 9.5  --  9.2  MG  --   --  1.4*  --     Liver Function Tests:  Recent Labs Lab 04/20/14 1255 04/26/14 1654  AST 33 27  ALT 21 18  ALKPHOS 75 67  BILITOT 1.1 0.8  PROT 6.9 6.4  ALBUMIN 3.8 3.6    Recent Labs Lab 04/20/14 1255  LIPASE 37   CBC:  Recent Labs Lab 04/20/14 1255 04/26/14 1654  WBC 8.7 6.5  NEUTROABS 6.9 3.2  HGB 13.9 13.0  HCT 40.9 39.7  MCV 85.7 88.0  PLT 317 307   CBG:  Recent Labs Lab 04/20/14 1352 04/26/14 1608  GLUCAP 177* 111*    Microbiology: Results for orders placed or performed during the hospital encounter of 04/26/14  MRSA PCR Screening     Status: None   Collection Time: 04/26/14  7:43 PM  Result Value Ref Range Status   MRSA by PCR NEGATIVE NEGATIVE Final    Comment:        The GeneXpert MRSA Assay (FDA approved for NASAL specimens only), is one component of a  comprehensive MRSA colonization surveillance program. It is not intended to diagnose MRSA infection nor to guide or monitor treatment for MRSA infections.     Coagulation Studies:  Recent Labs  04/26/14 1615 04/27/14 0630  LABPROT  --  22.4*  INR 2.3 1.95*    Imaging: No results found.     Medications:    Infusions: . sodium chloride 100 mL/hr at 04/27/14 0759    Scheduled Medications: . feeding supplement (ENSURE COMPLETE)  237 mL Oral BID BM  . FLUoxetine  20 mg Oral Daily  . levothyroxine  50 mcg Oral QAC breakfast  . Warfarin - Pharmacist Dosing Inpatient   Does not apply q1800    PRN Medications:    Assessment/ Plan:    Principal Problem:   Chronic diarrhea of unknown origin Active Problems:   Hypothyroidism   Diabetes type 2, controlled   Depression   Essential hypertension   Atrial  fibrillation   GERD   Diverticulosis   Vertigo  #Chronic diarrhea Has resolved with hospitalization, suggesting potential behavioral or medication effects. However, she does not have any unusual exposures at home. She does note that the diarrhea has started since her dose of metformin was increased, which seems like the most common etiology currently. Omeprazole less likely, but we'll hold given lack of GERD symptoms. She also recently stopped a chronic pain medication, which may have been masking her symptoms. Her orthostatics were negative suggesting that her dizziness is not related to her diarrhea. We'll adjust her medications and defer more intensive workup for outpatient. Unable to obtain stool labs due to no diarrhea currently. C. difficile, fecal lactoferrin, stool fat, stool culture negative on 04/19/2014. Blood pressure still slightly low this morning. Magnesium repleted. -Continue normal saline at 100 mL per hour to complete 10 hours. -Continue card-modified diet. -Continue to hold metformin and omeprazole. -Follow-up HIV, vitamin D 25, ferritin, folate, vitamin B-12. -Follow-up stool labs can be obtained. -Outpatient workup could include additional stool analysis, breath H2 test for lactose intolerance, small bowel biopsy for bacterial overgrowth, celiac workup. -She will need a colonoscopy as an outpatient to follow-up previous diverticulitis and this could be revealing. -We'll arrange outpatient GI follow-up. -Recheck magnesium this morning.  #Vertigo She is not orthostatic, and complains that the room is been spinning intermittently. This occurs with movement, particularly with moving her head to the left. She does have some tinnitus, but this is intermittent and may be related to eustachian tube dysfunction with her description of ocean sounds. No associated hearing loss. Lateral nystagmus with movement of head, most likely peripheral in etiology. Symptoms appear to be most  consistent with BPPV. Question vascular tendinitis with intermittent whooshing, but this does not appear to be related to vascular system given the low frequency of the sounds. -PT vestibular evaluation. -Further workup as necessary.  #Dysuria Urinalysis negative for UTI, denies urinary symptoms this morning. Most likely due to urine concentration. -No treatment.  #Hypertension Blood pressure improved this morning. -Restart home Cozaar and atenolol. -Continue to hold home Lasix while rehydrating.  #Type 2 diabetes Metformin likely causing diarrhea, but good control on current regimen. Blood sugars currently stable. -Plan to discontinue metformin on discharge with switch to other oral glycemic control medication. -Start sliding scale insulin sensitive.  #Atrial fibrillation Rate well controlled this morning. -Continue Coumadin per pharmacy. -Restart home diltiazem.  #GERD No reflux symptoms off of home omeprazole. -Trial off of PPI at discharge.  #Hypothyroidism -Continue home levothyroxine.  #Depression Diarrhea improved with  continued use of Prozac, diarrhea most likely due to metformin. -Continue home Prozac.  #Hyperlipidemia -Continue pravastatin.   DVT PPX - Coumadin with INR goal 2-3  CODE STATUS - Full.  CONSULTS PLACED - None.  DISPO - Possible discharge this afternoon or tomorrow after seen by PT.  The patient does have a current PCP Aundra Dubin, Nino Glow, MD) and does need an [facility] hospital follow-up appointment after discharge.    Is the [facility] hospital follow-up appointment a one-time only appointment? no.  Does the patient have transportation limitations that hinder transportation to clinic appointments? no   SERVICE NEEDED AT [place]         Y = Yes, Blank = No PT:   OT:   RN:   Equipment:   Other:      Length of Stay:  day(s)   Signed: Arman Filter, MD  PGY-1, Internal Medicine Resident Pager:  [phone] (7AM-5PM) 04/27/2014, 9:42 AM

## 2014-04-27 NOTE — Care Management Note (Addendum)
Jeneen Rinks with [facility] confirmed fax was received. Magdalen Spatz RN BSN [phone]     Confirmed face sheet information with patient . Provided list of agencies for home health , patient prefers [place] , referral given to [facility] with [facility].   PT recommended rolling walker , same ordered through [place] due to insurance.  Spoke with Jeneen Rinks with [facility] [phone] , [employer] can deliver the walker to patient at hospital in 4 hours or deliver to her home in [place] . Patient prefers walker to be delivered to hospital , [place] aware. Orders faxed to [facility] at [phone] .   Magdalen Spatz RN BSN [phone]

## 2014-04-27 NOTE — Progress Notes (Signed)
ANTICOAGULATION CONSULT NOTE - Follow Up Consult  Pharmacy Consult for coumadin Indication: atrial fibrillation  Allergies  Allergen Reactions  . Gemfibrozil Swelling    REACTION: Angioedema  . Latex Other (See Comments)    Blisters where touched or applied  . Penicillins Hives    Will spread in patches all over the body.  . Adhesive [Tape] Other (See Comments)    Will blister skin where applied - do not use BAND-AIDS.  Marland Kitchen Codeine Hives    Will spread in patches all over the body.  . Digoxin And Related     Makes BP drop  . Ace Inhibitors Rash    Cough and rash     Patient Measurements: Height: 5\' 3"  (160 cm) Weight: 164 lb (74.39 kg) IBW/kg (Calculated) : 52.4  Vital Signs: Temp: 98.3 F (36.8 C) (02/19 0919) Temp Source: Oral (02/19 0919) BP: 139/75 mmHg (02/19 1210) Pulse Rate: 81 (02/19 1210)  Labs:  Recent Labs  04/26/14 1615 04/26/14 1654 04/27/14 0630  HGB  --  13.0  --   HCT  --  39.7  --   PLT  --  307  --   LABPROT  --   --  22.4*  INR 2.3  --  1.95*  CREATININE  --  0.75 0.75    Estimated Creatinine Clearance: 67.7 mL/min (by C-G formula based on Cr of 0.75).   Medical History: Past Medical History  Diagnosis Date  . Depression   . Obesity   . Skin neoplasm   . Bunion   . Carotid stenosis   . Fibromyalgia   . Internal hemorrhoid   . GERD (gastroesophageal reflux disease)   . Chronic gastritis   . Transaminase or LDH elevation   . Mild cognitive impairment   . Hyperlipidemia   . Fatty liver   . Dysphagia     no documented strictures but responded positively to dilation in past.   . Hypertension   . Cholecystitis     s/p cholecystectomy  . Sarcoidosis   . Fibromyalgia   . CHF (congestive heart failure)   . Atrial fibrillation   . Exertional dyspnea   . Hypothyroidism   . Hypothyroid   . Diverticulosis   . Digoxin toxicity     08/2011  . Bradycardia, drug induced     08/2011 (dig)  . Drug-induced hypotension     08/2011 (Dig)   . Carpal tunnel syndrome     mild   . Dysrhythmia     HX OF ATRIAL FIBRILATION  . Diverticulitis     dx 04/08/14  . Family history of adverse reaction to anesthesia     "my mother may have"  . Pneumonia X 2  . OSA (obstructive sleep apnea) dx'd 2003    "can't sleep w/that mask; lost some weight; maybe that's helped" (04/26/2014)  . Diabetic peripheral neuropathy   . Type II diabetes mellitus   . H/O hiatal hernia     S/P hernia repair  . Epileptic seizure, tonic     .No meds since age of 41.  Marland Kitchen TIA (transient ischemic attack)     "don't know when I had it" (04/26/2014)  . Osteoarthritis   . Chronic back pain   . Skin cancer 1990's    "front of my right leg"    Medications:  Prescriptions prior to admission  Medication Sig Dispense Refill Last Dose  . Blood Glucose Calibration (ACCU-CHEK SMARTVIEW CONTROL) LIQD 1 Device by In Vitro route as  needed. Use to perform control/ calibration on accu-chek meter. diag code E11.9. Non insulin dependent 3 each 0 Past Month at Unknown time  . Lancets 30G MISC DX Code: E11.9 Check blood sugars one time per day. Accucheck fastclix lancets 100 each 11 Past Month at Unknown time  . Probiotic Product (TRUNATURE DIGESTIVE PROBIOTIC PO) Take 1 capsule by mouth daily.   04/26/2014 at unknown  . tiZANidine (ZANAFLEX) 4 MG tablet Take 1 tablet (4 mg total) by mouth every 12 (twelve) hours as needed for muscle spasms. (Patient taking differently: Take 2 mg by mouth every 12 (twelve) hours as needed for muscle spasms. ) 180 tablet 0 Past Week at Unknown time  . atenolol (TENORMIN) 25 MG tablet Take 1.5 tablets (37.5 mg total) by mouth 2 (two) times daily. 270 tablet 1 04/26/2014 at 0900  . diltiazem (CARDIZEM CD) 180 MG 24 hr capsule Take 1 capsule (180 mg total) by mouth daily. 90 capsule 1 04/26/2014 at unknown  . diphenoxylate-atropine (LOMOTIL) 2.5-0.025 MG per tablet Take 1 tablet by mouth 4 (four) times daily as needed for diarrhea or loose stools.  (Patient not taking: Reported on 04/26/2014) 30 tablet 0 Not Taking at Unknown time  . FLUoxetine (PROZAC) 20 MG capsule Take 1 capsule (20 mg total) by mouth daily. 90 capsule 2 04/26/2014 at unknown  . furosemide (LASIX) 20 MG tablet Take 1 tablet (20 mg total) by mouth daily. 90 tablet 1 04/26/2014 at unknown  . levothyroxine (SYNTHROID, LEVOTHROID) 50 MCG tablet Take 1 tablet (50 mcg total) by mouth every morning. 90 tablet 1 04/26/2014 at unknown  . losartan (COZAAR) 25 MG tablet Take 0.5 tablets (12.5 mg total) by mouth daily. 90 tablet 1 04/26/2014 at unknown  . omeprazole (PRILOSEC) 20 MG capsule Take 1 capsule (20 mg total) by mouth daily. 90 capsule 1 04/26/2014 at unknown  . potassium chloride (K-DUR) 10 MEQ tablet Take 1 tablet (10 mEq total) by mouth daily. 90 tablet 1 04/26/2014 at unknown  . pravastatin (PRAVACHOL) 40 MG tablet Take 1 tablet (40 mg total) by mouth daily. 90 tablet 1 04/26/2014 at unknown  . warfarin (COUMADIN) 5 MG tablet Take 0.5 tablets (2.5 mg total) by mouth at bedtime. Take 2.5mg  daily until further instructed. (Patient taking differently: Take 2.5 mg by mouth at bedtime. ) 90 tablet 0 04/25/2014 at unknown  . [DISCONTINUED] metFORMIN (GLUCOPHAGE) 1000 MG tablet TAKE ONE TABLET TWICE A DAY WITH FOOD 180 tablet 1 04/26/2014 at unknown   Scheduled:  . atenolol  37.5 mg Oral BID  . diltiazem  180 mg Oral Daily  . [START ON 04/28/2014] feeding supplement (ENSURE COMPLETE)  237 mL Oral Q24H  . FLUoxetine  20 mg Oral Daily  . insulin aspart  0-5 Units Subcutaneous QHS  . insulin aspart  0-9 Units Subcutaneous TID WC  . levothyroxine  50 mcg Oral QAC breakfast  . losartan  12.5 mg Oral Daily  . warfarin  5 mg Oral ONCE-1800  . Warfarin - Pharmacist Dosing Inpatient   Does not apply q1800    Assessment: 64 yo female here with near syncope and chronic diarrhea. She is on coumadin PTA and pharmacy has been consulted to dose. INR today has trended down to 1.95. No bleeding  noted.   Home coumadin dose: Coumadin regimen is not clear but she reports taking 5mg  coumadin on 2/13 and 2.5mg /day since then (last dose 04/25/14). Prior to 2/13 she is unable to recall her regimen accurately. She follows with  Dr. Elie Confer as outpatient (last seen 04/19/14).  Goal of Therapy:  INR 2-3 Monitor platelets by anticoagulation protocol: Yes   Plan:  -Coumadin 5 mg po today -Daily PT/INR -Will follow-up with Dr. Elie Confer regarding her coumadin regimen  [name], PharmD., BCPS Clinical Pharmacist Pager [phone]

## 2014-04-27 NOTE — Progress Notes (Addendum)
INITIAL NUTRITION ASSESSMENT  DOCUMENTATION CODES Per approved criteria  -Not Applicable   INTERVENTION: -Decrease Ensure Complete po to daily, each supplement provides 350 kcal and 13 grams of protein -Provided education on low vitamin K foods (anticoagulants), diverticulitis, and diabetes  NUTRITION DIAGNOSIS: Inadequate oral intake related to decreased appetite as evidenced by diet hx, ~50% meal completion.   Goal: Pt will meet >90% of estimated nutritional needs  Monitor:  PO/supplement intake, labs, weight changes, I/O's  Reason for Assessment: MST=3  64 y.o. female  Admitting Dx: Chronic diarrhea of unknown origin  64 yo female with DM II, HTN, OA, Afib on coumadin, CHF, hypothyroidism, stroke, presenting from clinic with chronic diarrhea and lightheadedness.  ASSESSMENT: Pt reports variable intake over the past 4-6 months, due to ongoing diarrhea. She reports source of diarrhea is likely multifactorial, as she was on multiple medications of which this was a side effect. Of note, she shared with this RD that her Metformin dosage was recently increased from 500 mg BID to 1000 mg BID. She was able to tolerate this medication well prior to dosage increase.  She reports her appetite is variable- "sometimes I eat well, other days I don't". She reports she has decreased the amount of fiber in her diet due to diverticulitis. Additionally, she reports a 20# wt loss over the past 4-6 months, but attributes this to her "headahe medications", which caused weight gain prior to being discontinued. She ate all of her breakfast this morning except oatmeal. Pt voiced concern about being sure her nutritional needs are met. She reports she drank a strawberry Ensure and would like to continue on this supplement. She requests that supplement is decrease to once a day, due to improving appetite and fear of weight gain.  Pt has multiple nutrition questions about diet therapy on anticoagulants, diet  therapy for diverticulitis, and diabetes. All questions were answered to pt's satisfaction.  Nutrition-focused physical exam showed no signs of fat and muscle depletion.  Labs reviewed. BUN: 5, Mg: 1.4, Glucose: 114, CBGS: 111.   Height: Ht Readings from Last 1 Encounters:  04/26/14 5' 3"  (1.6 m)    Weight: Wt Readings from Last 1 Encounters:  04/26/14 164 lb (74.39 kg)    Ideal Body Weight: 115#  % Ideal Body Weight: 143%  Wt Readings from Last 10 Encounters:  04/26/14 164 lb (74.39 kg)  04/20/14 186 lb (84.369 kg)  04/19/14 186 lb 11.2 oz (84.687 kg)  04/16/14 188 lb 6 oz (85.446 kg)  12/21/13 192 lb 9.6 oz (87.363 kg)  12/14/13 190 lb 12.8 oz (86.546 kg)  11/30/13 191 lb 11.2 oz (86.955 kg)  11/10/13 196 lb (88.905 kg)  10/27/13 198 lb (89.812 kg)  10/26/13 197 lb 8 oz (89.585 kg)    Usual Body Weight: 190#  % Usual Body Weight: 86%  BMI:  Body mass index is 29.06 kg/(m^2). Overweight  Estimated Nutritional Needs: Kcal: 1500-1700 Protein: 64-74 grams Fluid: 1.5-1.7 L  Skin: rash to medial back  Diet Order: Diet Carb Modified  EDUCATION NEEDS: -Education needs addressed   Intake/Output Summary (Last 24 hours) at 04/27/14 1139 Last data filed at 04/27/14 0500  Gross per 24 hour  Intake    580 ml  Output    400 ml  Net    180 ml    Last BM: 04/26/14  Labs:   Recent Labs Lab 04/20/14 1255 04/26/14 1654 04/26/14 2000 04/27/14 0630  NA 140 137  --  141  K 3.2*  4.0  --  3.8  CL 106 106  --  108  CO2 22 25  --  28  BUN 7 7  --  5*  CREATININE 0.72 0.75  --  0.75  CALCIUM 9.5 9.5  --  9.2  MG  --   --  1.4*  --   GLUCOSE 171* 107*  --  114*    CBG (last 3)   Recent Labs  04/26/14 1608  GLUCAP 111*    Scheduled Meds: . atenolol  37.5 mg Oral BID  . diltiazem  180 mg Oral Daily  . feeding supplement (ENSURE COMPLETE)  237 mL Oral BID BM  . FLUoxetine  20 mg Oral Daily  . insulin aspart  0-5 Units Subcutaneous QHS  . insulin aspart   0-9 Units Subcutaneous TID WC  . levothyroxine  50 mcg Oral QAC breakfast  . losartan  12.5 mg Oral Daily  . Warfarin - Pharmacist Dosing Inpatient   Does not apply q1800    Continuous Infusions: . sodium chloride 100 mL/hr at 04/27/14 0759    Past Medical History  Diagnosis Date  . Depression   . Obesity   . Skin neoplasm   . Bunion   . Carotid stenosis   . Fibromyalgia   . Internal hemorrhoid   . GERD (gastroesophageal reflux disease)   . Chronic gastritis   . Transaminase or LDH elevation   . Mild cognitive impairment   . Hyperlipidemia   . Fatty liver   . Dysphagia     no documented strictures but responded positively to dilation in past.   . Hypertension   . Cholecystitis     s/p cholecystectomy  . Sarcoidosis   . Fibromyalgia   . CHF (congestive heart failure)   . Atrial fibrillation   . Exertional dyspnea   . Hypothyroidism   . Hypothyroid   . Diverticulosis   . Digoxin toxicity     08/2011  . Bradycardia, drug induced     08/2011 (dig)  . Drug-induced hypotension     08/2011 (Dig)  . Carpal tunnel syndrome     mild   . Dysrhythmia     HX OF ATRIAL FIBRILATION  . Diverticulitis     dx 04/08/14  . Family history of adverse reaction to anesthesia     "my mother may have"  . Pneumonia X 2  . OSA (obstructive sleep apnea) dx'd 2003    "can't sleep w/that mask; lost some weight; maybe that's helped" (04/26/2014)  . Diabetic peripheral neuropathy   . Type II diabetes mellitus   . H/O hiatal hernia     S/P hernia repair  . Epileptic seizure, tonic     .No meds since age of 66.  Marland Kitchen TIA (transient ischemic attack)     "don't know when I had it" (04/26/2014)  . Osteoarthritis   . Chronic back pain   . Skin cancer 1990's    "front of my right leg"    Past Surgical History  Procedure Laterality Date  . Hiatal hernia repair  1990    "had to have scar tissue removed 6 months after repair"  . Esophageal dilation    . Cataract extraction w/phaco  10/27/2010     Procedure: CATARACT EXTRACTION PHACO AND INTRAOCULAR LENS PLACEMENT (IOC);  Surgeon:  Che;  Location: [facility];  Service: Ophthalmology;  Laterality: Left;  CDE- 1.78  . Cataract extraction w/phaco  07/13/2011    Procedure: CATARACT EXTRACTION PHACO AND  INTRAOCULAR LENS PLACEMENT (IOC);  Surgeon:  Che, MD;  Location: [facility];  Service: Ophthalmology;  Laterality: Right;  CDE:  1.65  . Liposuction  1992  . Inverted nipples  1992  . Breast surgery Right ~ 2010    excision milk duct;  . Foot surgery Left ~ 2011    "straightened toe & scraped bone below big toe"  . Foot surgery Right ~ 2011    "scraped bone of big toe; shortened middle toet"  . Dilation and curettage of uterus  1970; 1976  . Abdominal hysterectomy  1980's    partial  . Tubal ligation  1976  . Bilateral oophorectomy  1980's    "after partial hysterectomy"  . Skin cancer excision  1990's    "front side of right shin"  . Ventral hernia repair  07/29/2011    Procedure: LAPAROSCOPIC VENTRAL HERNIA;  Surgeon: Adin Hector, MD;  Location: [facility];  Service: General;  Laterality: N/A;  multiple incarcerated hernias with mesh  . Esophageal dilation  multiple  . Breast biopsy Right     benign  . Salpingoophorectomy Bilateral     "6 months or so after hysterectomy"  . Laparoscopic cholecystectomy  1980's     A. Jimmye Norman, RD, LDN, CDE Pager: [phone] After hours Pager: [phone]

## 2014-04-27 NOTE — Progress Notes (Signed)
Medicine attending: Medical history, presenting problems, physical findings, and medications, reviewed with Dr Soli Kennerly on the day of the patient visit and I concur with her evaluation and management plan. 

## 2014-04-27 NOTE — Evaluation (Signed)
Physical Therapy Evaluation Patient Details Name: [name] MRN: [id number] DOB: 07/27/1950 Today's Date: 04/27/2014   History of Present Illness  64 yo female with DM II, HTN, OA, Afib on coumadin, CHF, hypothyroidism, stroke, presenting from clinic with chronic diarrhea and lightheadedness  Clinical Impression  Patient presents with left posterior canal BPPV and limited mobility due to imbalance and vertiginous symptoms.  She will benefit from skilled PT in the acute setting to maximize independence and try to reposition canaliths from posterior canal. Patient likely safest to receive HHPT initially for vestibular rehab as she lives alone and currently needs walker for safe mobility.  PLEASE REFER TO SECOND NOTE FOR DETAILED VESTIBULAR EVALUATION.    Follow Up Recommendations Home health PT (for vestibular rehab)    Equipment Recommendations  Rolling walker with 5" wheels    Recommendations for Other Services       Precautions / Restrictions Precautions Precautions: Fall      Mobility  Bed Mobility Overal bed mobility: Modified Independent             General bed mobility comments: slow and cues for visual fixation  Transfers Overall transfer level: Needs assistance Equipment used: Rolling walker (2 wheeled) Transfers: Sit to/from Stand Sit to Stand: Supervision         General transfer comment: slow and cues for visual fixation  Ambulation/Gait Ambulation/Gait assistance: Supervision Ambulation Distance (Feet): 175 Feet Assistive device: Rolling walker (2 wheeled) Gait Pattern/deviations: Step-through pattern;Decreased stride length Gait velocity: slow pace   General Gait Details: cues for visual fixation and turning technique  Stairs            Wheelchair Mobility    Modified Rankin (Stroke Patients Only)       Balance Overall balance assessment: Needs assistance           Standing balance-Leahy Scale: Fair Standing balance  comment: can stand unsupported, but feels more confident with UE support on walker                             Pertinent Vitals/Pain Pain Assessment: Faces Faces Pain Scale: Hurts even more Pain Location: back pain  Pain Intervention(s): RN gave pain meds during session;Monitored during session    [place] expects to be discharged to:: Private residence Living Arrangements: Alone Available Help at Discharge: Available PRN/intermittently;Friend(s) Type of Home: House Home Access: Stairs to enter   [employer] of Steps: 1 in front no rails, 4-5 on side with railings, 7-8 in back with railings Home Layout: One level Home Equipment: None      Prior Function Level of Independence: Independent               Hand Dominance   Dominant Hand: Left    Extremity/Trunk Assessment               Lower Extremity Assessment: Overall WFL for tasks assessed         Communication   Communication: No difficulties  Cognition Arousal/Alertness: Awake/alert Behavior During Therapy: WFL for tasks assessed/performed Overall Cognitive Status: Within Functional Limits for tasks assessed                      General Comments      Exercises Other Exercises Other Exercises: Eply's performed for canalith repositioning due to left posterior canal BPPV      Assessment/Plan    PT Assessment Patient needs  continued PT services  PT Diagnosis Abnormality of gait;Other (comment) (Vertigo)   PT Problem List Decreased balance;Decreased mobility;Decreased strength;Decreased activity tolerance;Decreased knowledge of use of DME;Other (comment) (dizziness)  PT Treatment Interventions DME instruction;Balance training;Gait training;Neuromuscular re-education;Therapeutic exercise;Therapeutic activities;Functional mobility training;Patient/family education;Stair training;Other (comment) (canalith repositioning, vestibular rehab)   PT Goals  (Current goals can be found in the Care Plan section) Acute Rehab PT Goals Patient Stated Goal: To return to independent PT Goal Formulation: With patient Time For Goal Achievement: 05/04/14 Potential to Achieve Goals: Good    Frequency Min 3X/week   Barriers to discharge Decreased caregiver support      Co-evaluation               End of Session Equipment Utilized During Treatment: Gait belt Activity Tolerance: Other (comment) (limited by nausea, borderling "panic attack") Patient left: in bed;with call bell/phone within reach      Functional Assessment Tool Used: Cllinical Judgement Functional Limitation: Mobility: Walking and moving around Mobility: Walking and Moving Around Current Status (W4097): At least 20 percent but less than 40 percent impaired, limited or restricted Mobility: Walking and Moving Around Goal Status [phone]): At least 1 percent but less than 20 percent impaired, limited or restricted    Time: 1213-1256 PT Time Calculation (min) (ACUTE ONLY): 43 min   Charges:   PT Evaluation $Initial PT Evaluation Tier I: 1 Procedure PT Treatments $Gait Training: 8-22 mins $Canalith Rep Proc: 8-22 mins   PT G Codes:   PT G-Codes **NOT FOR INPATIENT CLASS** Functional Assessment Tool Used: Cllinical Judgement Functional Limitation: Mobility: Walking and moving around Mobility: Walking and Moving Around Current Status (J2426): At least 20 percent but less than 40 percent impaired, limited or restricted Mobility: Walking and Moving Around Goal Status [phone]): At least 1 percent but less than 20 percent impaired, limited or restricted    [facility] 04/27/2014, 1:52 PM  [place], [place] 04/27/2014

## 2014-04-27 NOTE — Progress Notes (Signed)
UR completed 

## 2014-04-27 NOTE — Telephone Encounter (Signed)
Patient was seen 11-FEB-16 and INR recorded/documented in that encounter. She was  Instructed to--using her 5mg  strength warfarin tablets--to take 5mg  on the Thursday I saw her; 2.5mg  on Friday; 5mg  on Saturday; 2.5mg  on Sunday--and she was to have returned to [facility] on Monday 15-FEB-16 as documented in both [email or web address] as well as [facility]/EPIC encounter. On 15-FEB-16 the [facility] was closed due to inclement weather. Though patients were seen the following day, the patient did not come to clinic. She apparently has been taking 2.5mg  warfarin since Monday 15-FEB-16 to day of admission. She is now admitted to hospital. The inpatient pharmacist called seeking clarity on her instructions she had been provided. See documentation for encounter dated 11-FEB-16 Anticoag Visit where the instructions she was provided are documented.

## 2014-04-27 NOTE — Progress Notes (Signed)
Physical Therapy Vestibular Evaluation    04/27/14 1335  Symptom Behavior  Type of Dizziness Spinning (c/o HA few days after onset of dizziness and off balance)  Frequency of Dizziness with head or body movement  Duration of Dizziness initally long duration (hours,) now several seconds after movement  Aggravating Factors Turning body quickly;Turning head quickly;Supine to sit;Sit to stand;Rolling to left  Relieving Factors Closing eyes;Rest  Occulomotor Exam  Occulomotor Alignment Normal  Spontaneous Absent  Gaze-induced Absent  Head shaking Horizontal Absent (but positive dizziness)  Smooth Pursuits Intact  Saccades Intact (one episode of 2 beats nystagmus target on left)  Vestibulo-Occular Reflex  VOR to Slow Head Movement Positive right (with left beating nystagmus)  Positional Testing  Dix-Hallpike Dix-Hallpike Left (postive for left rotarty nystagmus up to 16 seconds)  Sidelying Test Sidelying Left (postive for left rotarty nystagmus )  Horizontal Canal Testing Horizontal Canal Left ( very low intensity of right beating for 30 seconds )  Dix-Hallpike Left  Dix-Hallpike Left Duration 16 sec  Dix-Hallpike Left Symptoms Upbeat, left rotatory nystagmus  Sidelying Left  Sidelying Left Duration <30  Sidelying Left Symptoms Upbeat, left rotatory nystagmus  Horizontal Canal Left  Horizontal Canal Left Duration <30  Horizontal Canal Left Symptoms Ageotrophic;Nystagmus  Cognition  Cognition Orientation Level Oriented x 4   [place], [place] [phone] 04/27/2014

## 2014-04-28 DIAGNOSIS — A047 Enterocolitis due to Clostridium difficile: Secondary | ICD-10-CM | POA: Diagnosis not present

## 2014-04-28 DIAGNOSIS — E559 Vitamin D deficiency, unspecified: Secondary | ICD-10-CM | POA: Diagnosis present

## 2014-04-28 LAB — VITAMIN B12: VITAMIN B 12: 338 pg/mL (ref 211–911)

## 2014-04-28 LAB — PROTIME-INR
INR: 1.73 — ABNORMAL HIGH (ref 0.00–1.49)
Prothrombin Time: 20.4 seconds — ABNORMAL HIGH (ref 11.6–15.2)

## 2014-04-28 LAB — FERRITIN: FERRITIN: 12 ng/mL (ref 10–291)

## 2014-04-28 LAB — GLUCOSE, CAPILLARY
GLUCOSE-CAPILLARY: 104 mg/dL — AB (ref 70–99)
Glucose-Capillary: 114 mg/dL — ABNORMAL HIGH (ref 70–99)
Glucose-Capillary: 164 mg/dL — ABNORMAL HIGH (ref 70–99)
Glucose-Capillary: 122 mg/dL — ABNORMAL HIGH (ref 70–99)

## 2014-04-28 LAB — VITAMIN D 25 HYDROXY (VIT D DEFICIENCY, FRACTURES): Vit D, 25-Hydroxy: 16.4 ng/mL — ABNORMAL LOW (ref 30.0–100.0)

## 2014-04-28 MED ORDER — WARFARIN SODIUM 5 MG PO TABS
5.0000 mg | ORAL_TABLET | Freq: Once | ORAL | Status: AC
  Administered 2014-04-28: 5 mg via ORAL
  Filled 2014-04-28: qty 1

## 2014-04-28 MED ORDER — GLIPIZIDE 5 MG PO TABS
5.0000 mg | ORAL_TABLET | Freq: Every day | ORAL | Status: DC
  Filled 2014-04-28 (×2): qty 1

## 2014-04-28 MED ORDER — MAGNESIUM SULFATE 2 GM/50ML IV SOLN
2.0000 g | Freq: Once | INTRAVENOUS | Status: AC
  Administered 2014-04-28: 2 g via INTRAVENOUS
  Filled 2014-04-28: qty 50

## 2014-04-28 MED ORDER — VITAMIN D (ERGOCALCIFEROL) 1.25 MG (50000 UNIT) PO CAPS
50000.0000 [IU] | ORAL_CAPSULE | ORAL | Status: DC
  Administered 2014-04-28: 50000 [IU] via ORAL
  Filled 2014-04-28: qty 1

## 2014-04-28 MED ORDER — GLIPIZIDE 2.5 MG HALF TABLET
2.5000 mg | ORAL_TABLET | Freq: Every day | ORAL | Status: DC
  Administered 2014-04-28 – 2014-04-30 (×3): 2.5 mg via ORAL
  Filled 2014-04-28 (×4): qty 1

## 2014-04-28 MED ORDER — MECLIZINE HCL 25 MG PO TABS
25.0000 mg | ORAL_TABLET | Freq: Three times a day (TID) | ORAL | Status: DC | PRN
  Administered 2014-04-28 – 2014-04-29 (×3): 25 mg via ORAL
  Filled 2014-04-28 (×4): qty 1

## 2014-04-28 NOTE — Progress Notes (Signed)
Utilization Review completed.  

## 2014-04-28 NOTE — Progress Notes (Signed)
Physical Therapy Treatment Patient Details Name: [name] MRN: [id number] DOB: [birth year] Today's Date: 04/28/2014    History of Present Illness 63 yo female with DM II, HTN, OA, Afib on coumadin, CHF, hypothyroidism, stroke, presenting from clinic with chronic diarrhea and lightheadedness    PT Comments    Patient continues to have Vertigo symptoms - positive for Lt posterior canal BPPV with symptoms 3/5 - treated with Epley maneuver.  Was able to ambulate 180' with RW with good balance, using visual fixation techniques to minimize dizziness.   Follow Up Recommendations  Home health PT (for Vestibular Rehab)     Equipment Recommendations  Rolling walker with 5" wheels    Recommendations for Other Services       Precautions / Restrictions Precautions Precautions: Fall Precaution Comments: Vertigo Restrictions Weight Bearing Restrictions: No    Mobility  Bed Mobility Overal bed mobility: Modified Independent             General bed mobility comments: slow and cues for visual fixation  Transfers Overall transfer level: Needs assistance Equipment used: Rolling walker (2 wheeled) Transfers: Sit to/from Stand Sit to Stand: Supervision         General transfer comment: slow and cues for visual fixation  Ambulation/Gait Ambulation/Gait assistance: Supervision Ambulation Distance (Feet): 180 Feet Assistive device: Rolling walker (2 wheeled) Gait Pattern/deviations: Step-through pattern;Decreased stride length Gait velocity: slow pace Gait velocity interpretation: Below normal speed for age/gender General Gait Details: Patient with slow, guarded gait.  Cues for visual fixation and turning technique   Stairs            Wheelchair Mobility    Modified Rankin (Stroke Patients Only)       Balance                                    Cognition Arousal/Alertness: Awake/alert Behavior During Therapy: WFL for tasks  assessed/performed Overall Cognitive Status: Within Functional Limits for tasks assessed                      Vestibular Rehab *Dix-Hallpike positive for Lt posterior canal BPPV. *Epley maneuver for canalith repositioning.    General Comments        Pertinent Vitals/Pain Pain Assessment: 0-10 Pain Score: 5  Pain Location: back Pain Descriptors / Indicators: Aching;Constant;Sore Pain Intervention(s): Monitored during session;Repositioned    Home Living                      Prior Function            PT Goals (current goals can now be found in the care plan section) Acute Rehab PT Goals Patient Stated Goal: To return to independent Progress towards PT goals: Progressing toward goals    Frequency  Min 3X/week    PT Plan Current plan remains appropriate    Co-evaluation             End of Session Equipment Utilized During Treatment: Gait belt Activity Tolerance: Patient tolerated treatment well (Remains limited by dizziness) Patient left: in chair;with call bell/phone within reach;with family/visitor present     Time: 1735-1815 PT Time Calculation (min) (ACUTE ONLY): 40 min  Charges:  $Gait Training: 8-22 mins $Therapeutic Activity: 8-22 mins $Canalith Rep Proc: 8-22 mins  G CodesDespina Pole 04/28/2014, 7:57 PM Carita Pian. Sanjuana Kava, [place] Pager [phone]

## 2014-04-28 NOTE — Progress Notes (Signed)
ANTICOAGULATION CONSULT NOTE - Follow Up Consult  Pharmacy Consult for coumadin Indication: atrial fibrillation  Allergies  Allergen Reactions  . Gemfibrozil Swelling    REACTION: Angioedema  . Latex Other (See Comments)    Blisters where touched or applied  . Penicillins Hives    Will spread in patches all over the body.  . Adhesive [Tape] Other (See Comments)    Will blister skin where applied - do not use BAND-AIDS.  Marland Kitchen Codeine Hives    Will spread in patches all over the body.  . Digoxin And Related     Makes BP drop  . Ace Inhibitors Rash    Cough and rash     Patient Measurements: Height: 5\' 3"  (160 cm) Weight: 164 lb (74.39 kg) IBW/kg (Calculated) : 52.4  Vital Signs: Temp: 97.8 F (36.6 C) (02/20 0607) Temp Source: Oral (02/20 0607) BP: 133/76 mmHg (02/20 0607) Pulse Rate: 76 (02/20 0607)  Labs:  Recent Labs  04/26/14 1615 04/26/14 1654 04/27/14 0630 04/28/14 0517  HGB  --  13.0  --   --   HCT  --  39.7  --   --   PLT  --  307  --   --   LABPROT  --   --  22.4* 20.4*  INR 2.3  --  1.95* 1.73*  CREATININE  --  0.75 0.75  --     Estimated Creatinine Clearance: 67.7 mL/min (by C-G formula based on Cr of 0.75).   Medical History: Past Medical History  Diagnosis Date  . Depression   . Obesity   . Skin neoplasm   . Bunion   . Carotid stenosis   . Fibromyalgia   . Internal hemorrhoid   . GERD (gastroesophageal reflux disease)   . Chronic gastritis   . Transaminase or LDH elevation   . Mild cognitive impairment   . Hyperlipidemia   . Fatty liver   . Dysphagia     no documented strictures but responded positively to dilation in past.   . Hypertension   . Cholecystitis     s/p cholecystectomy  . Sarcoidosis   . Fibromyalgia   . CHF (congestive heart failure)   . Atrial fibrillation   . Exertional dyspnea   . Hypothyroidism   . Hypothyroid   . Diverticulosis   . Digoxin toxicity     08/2011  . Bradycardia, drug induced     08/2011 (dig)   . Drug-induced hypotension     08/2011 (Dig)  . Carpal tunnel syndrome     mild   . Dysrhythmia     HX OF ATRIAL FIBRILATION  . Diverticulitis     dx 04/08/14  . Family history of adverse reaction to anesthesia     "my mother may have"  . Pneumonia X 2  . OSA (obstructive sleep apnea) dx'd 2003    "can't sleep w/that mask; lost some weight; maybe that's helped" (04/26/2014)  . Diabetic peripheral neuropathy   . Type II diabetes mellitus   . H/O hiatal hernia     S/P hernia repair  . Epileptic seizure, tonic     .No meds since age of 16.  Marland Kitchen TIA (transient ischemic attack)     "don't know when I had it" (04/26/2014)  . Osteoarthritis   . Chronic back pain   . Skin cancer 1990's    "front of my right leg"    Medications:  Prescriptions prior to admission  Medication Sig Dispense Refill Last Dose  .  Blood Glucose Calibration (ACCU-CHEK SMARTVIEW CONTROL) LIQD 1 Device by In Vitro route as needed. Use to perform control/ calibration on accu-chek meter. diag code E11.9. Non insulin dependent 3 each 0 Past Month at Unknown time  . Lancets 30G MISC DX Code: E11.9 Check blood sugars one time per day. Accucheck fastclix lancets 100 each 11 Past Month at Unknown time  . Probiotic Product (TRUNATURE DIGESTIVE PROBIOTIC PO) Take 1 capsule by mouth daily.   04/26/2014 at unknown  . tiZANidine (ZANAFLEX) 4 MG tablet Take 1 tablet (4 mg total) by mouth every 12 (twelve) hours as needed for muscle spasms. (Patient taking differently: Take 2 mg by mouth every 12 (twelve) hours as needed for muscle spasms. ) 180 tablet 0 Past Week at Unknown time  . atenolol (TENORMIN) 25 MG tablet Take 1.5 tablets (37.5 mg total) by mouth 2 (two) times daily. 270 tablet 1 04/26/2014 at 0900  . diltiazem (CARDIZEM CD) 180 MG 24 hr capsule Take 1 capsule (180 mg total) by mouth daily. 90 capsule 1 04/26/2014 at unknown  . diphenoxylate-atropine (LOMOTIL) 2.5-0.025 MG per tablet Take 1 tablet by mouth 4 (four) times  daily as needed for diarrhea or loose stools. (Patient not taking: Reported on 04/26/2014) 30 tablet 0 Not Taking at Unknown time  . FLUoxetine (PROZAC) 20 MG capsule Take 1 capsule (20 mg total) by mouth daily. 90 capsule 2 04/26/2014 at unknown  . furosemide (LASIX) 20 MG tablet Take 1 tablet (20 mg total) by mouth daily. 90 tablet 1 04/26/2014 at unknown  . levothyroxine (SYNTHROID, LEVOTHROID) 50 MCG tablet Take 1 tablet (50 mcg total) by mouth every morning. 90 tablet 1 04/26/2014 at unknown  . losartan (COZAAR) 25 MG tablet Take 0.5 tablets (12.5 mg total) by mouth daily. 90 tablet 1 04/26/2014 at unknown  . omeprazole (PRILOSEC) 20 MG capsule Take 1 capsule (20 mg total) by mouth daily. 90 capsule 1 04/26/2014 at unknown  . potassium chloride (K-DUR) 10 MEQ tablet Take 1 tablet (10 mEq total) by mouth daily. 90 tablet 1 04/26/2014 at unknown  . pravastatin (PRAVACHOL) 40 MG tablet Take 1 tablet (40 mg total) by mouth daily. 90 tablet 1 04/26/2014 at unknown  . warfarin (COUMADIN) 5 MG tablet Take 0.5 tablets (2.5 mg total) by mouth at bedtime. Take 2.5mg  daily until further instructed. (Patient taking differently: Take 2.5 mg by mouth at bedtime. ) 90 tablet 0 04/25/2014 at unknown  . [DISCONTINUED] metFORMIN (GLUCOPHAGE) 1000 MG tablet TAKE ONE TABLET TWICE A DAY WITH FOOD 180 tablet 1 04/26/2014 at unknown   Scheduled:  . atenolol  37.5 mg Oral BID  . diltiazem  180 mg Oral Daily  . feeding supplement (ENSURE COMPLETE)  237 mL Oral Q24H  . FLUoxetine  20 mg Oral Daily  . glipiZIDE  2.5 mg Oral QAC breakfast  . levothyroxine  50 mcg Oral QAC breakfast  . losartan  12.5 mg Oral Daily  . vancomycin  125 mg Oral QID  . Warfarin - Pharmacist Dosing Inpatient   Does not apply q1800    Assessment: 64 yo female here with near syncope and Clostridium difficile diarrhea. She is on chronic anticoagulation with Coumadin for atrial fibrillation and her INR remains subtherapeutic today.   Note oral  Vancomycin was started for CDI which has a lower potential for interaction with Coumadin as compared to Metronidazole.  Home coumadin dose: She reports taking 5mg  coumadin on 2/13 and 2.5mg /day since then (last dose 04/25/14).  Her prior  to admission regimen was not stable at the time of admission due to recent antibiotic therapy.  Goal of Therapy:  INR 2-3 Monitor platelets by anticoagulation protocol: Yes   Plan:  -Coumadin 5 mg po today -Daily PT/INR  Legrand Como, Pharm.D., BCPS, AAHIVP Clinical Pharmacist Phone: [phone] or [phone] 04/28/2014, 1:16 PM

## 2014-04-28 NOTE — Progress Notes (Addendum)
Subjective:    She reports mild improvement of her dizziness, but she had significant nausea after working with PT yesterday that is continued to this morning. She continues to feel quite dizzy with any movement or ambulation. Her diarrhea continues to be improved, and she had a normal formed stool yesterday once. She doesn't feel comfortable going home given her dizziness and the fact that she lives alone. She has been eating a normal diet without issue.  Interval Events: -C. difficile PCR positive yesterday, started on oral vancomycin. -Seen by PT who felt her symptoms were most consistent with BPPV, they performed manipulations yesterday and recommended home health PT for ongoing treatment. -Vital signs stable.    Objective:    Vital Signs:   Temp:  [97.4 F (36.3 C)-98.2 F (36.8 C)] 97.8 F (36.6 C) (02/20 0607) Pulse Rate:  [70-85] 76 (02/20 0607) Resp:  [18-20] 18 (02/20 0607) BP: (125-141)/(66-76) 133/76 mmHg (02/20 0607) SpO2:  [99 %] 99 % (02/20 0607) Last BM Date: 04/27/14  24-hour weight change: Weight change:   Intake/Output: 11111  Intake/Output Summary (Last 24 hours) at 04/28/14 1025 Last data filed at 04/28/14 0953  Gross per 24 hour  Intake    600 ml  Output    800 ml  Net   -200 ml      Physical Exam: General: Vital signs reviewed and noted. Well-developed, well-nourished, in no acute distress; alert, appropriate and cooperative throughout examination.  Lungs:  Normal respiratory effort. Clear to auscultation BL without crackles or wheezes.  Heart: Irregularly, irregular without gallop, murmur, or rubs.  Abdomen:  BS normoactive. Soft, Nondistended, non-tender.  No masses or organomegaly.  Extremities: No pretibial edema.  Neuro: Nystagmus improved from yesterday with no focal neurologic findings.      Labs:  Basic Metabolic Panel:  Recent Labs Lab 04/26/14 1654 04/26/14 2000 04/27/14 0630 04/27/14 1120  NA 137  --  141  --   K 4.0  --   3.8  --   CL 106  --  108  --   CO2 25  --  28  --   GLUCOSE 107*  --  114*  --   BUN 7  --  5*  --   CREATININE 0.75  --  0.75  --   CALCIUM 9.5  --  9.2  --   MG  --  1.4*  --  1.8    Liver Function Tests:  Recent Labs Lab 04/26/14 1654  AST 27  ALT 18  ALKPHOS 67  BILITOT 0.8  PROT 6.4  ALBUMIN 3.6   CBC:  Recent Labs Lab 04/26/14 1654  WBC 6.5  NEUTROABS 3.2  HGB 13.0  HCT 39.7  MCV 88.0  PLT 307   CBG:  Recent Labs Lab 04/26/14 1608 04/27/14 1209 04/27/14 1731 04/27/14 2140 04/28/14 0757  GLUCAP 111* 126* 100* 136* 114*    Microbiology: Results for orders placed or performed during the hospital encounter of 04/26/14  MRSA PCR Screening     Status: None   Collection Time: 04/26/14  7:43 PM  Result Value Ref Range Status   MRSA by PCR NEGATIVE NEGATIVE Final    Comment:        The GeneXpert MRSA Assay (FDA approved for NASAL specimens only), is one component of a comprehensive MRSA colonization surveillance program. It is not intended to diagnose MRSA infection nor to guide or monitor treatment for MRSA infections.   Clostridium Difficile by PCR  Status: Abnormal   Collection Time: 04/27/14 10:59 AM  Result Value Ref Range Status   C difficile by pcr POSITIVE (A) NEGATIVE Final    Comment: CRITICAL RESULT CALLED TO, READ BACK BY AND VERIFIED WITH: B. FOUST RN 13:20 04/27/14 ([email or web address])     Coagulation Studies:  Recent Labs  04/26/14 1615 04/27/14 0630 04/28/14 0517  LABPROT  --  22.4* 20.4*  INR 2.3 1.95* 1.73*    Imaging: No results found.     Medications:    Infusions:    Scheduled Medications: . atenolol  37.5 mg Oral BID  . diltiazem  180 mg Oral Daily  . feeding supplement (ENSURE COMPLETE)  237 mL Oral Q24H  . FLUoxetine  20 mg Oral Daily  . insulin aspart  0-5 Units Subcutaneous QHS  . insulin aspart  0-9 Units Subcutaneous TID WC  . levothyroxine  50 mcg Oral QAC breakfast  . losartan  12.5 mg Oral Daily    . vancomycin  125 mg Oral QID  . Warfarin - Pharmacist Dosing Inpatient   Does not apply q1800    PRN Medications:    Assessment/ Plan:    Principal Problem:   Chronic diarrhea of unknown origin Active Problems:   Hypothyroidism   Diabetes type 2, controlled   Depression   Essential hypertension   Atrial fibrillation   GERD   Diverticulosis   Vertigo  #Chronic diarrhea C. difficile PCR positive, which accounts for her recent increase in diarrhea, likely secondary to her Cipro Flagyl she received for diverticulitis. Her chronic diarrhea is still most likely due to her metformin use. We'll continue to hold PPI in the setting of C. difficile. She does have low ferritin and vitamin D 25, but her vitamin B-12 is normal. Other stool tests pending. -Stop fluids. -Continue card-modified diet. -Continue to hold metformin and omeprazole. -Follow-up HIV, folate. -Follow-up open parasites and stool norovirus. -Outpatient workup could include additional stool analysis, breath H2 test for lactose intolerance, small bowel biopsy for bacterial overgrowth, celiac workup if diarrhea does not continue to be improved. -She will need a colonoscopy as an outpatient to follow-up previous diverticulitis and this could be revealing. -Follow-up with GI as an outpatient.  #Vertigo Physical therapy agreed her systems were most consistent with BPPV. She received some manipulation exercises yesterday with mild improvement. They will work with her today as well, but she'll need home health PT. Denies any tinnitus currently. She is hesitant to go home today given her ongoing dizziness. -Work with PT today. -Ordered home health PT. -Meclizine 25 mg 3 times a day when necessary. -Likely discharge tomorrow.  #Hypomagnesemia Likely due to diarrhea. Magnesium still slightly low this morning at 1.8. -2 g magnesium sulfate IV. -Recheck magnesium tomorrow.  #Hypertension Normotensive on home blood pressure  meds. -Continue home Cozaar and atenolol. -Continue to hold home Lasix. Restart if needed.  #Type 2 diabetes Metformin likely causing chronic diarrhea. Has not been receiving much sliding scale insulin. We'll have her switch to glipizide.  Will start low due to lack of need for sliding scale currently and advanced age. -Start glipizide 2.5 mg daily to monitor blood sugars while she is in the hospital. -Stop sliding scale insulin. -Continue CBGs before meals at bedtime.  #Atrial fibrillation Rate well controlled. -Continue Coumadin per pharmacy. -Continue home diltiazem.  #GERD -Holding PPI in the setting of C. difficile.  #Hypothyroidism -Continue home levothyroxine.  #Depression -Continue home Prozac.  #Hyperlipidemia -Continue pravastatin.   DVT PPX - Coumadin  with INR goal 2-3  CODE STATUS - Full.  CONSULTS PLACED - None.  DISPO - Likely discharge tomorrow.  The patient does have a current PCP Aundra Dubin, Nino Glow, MD) and does need an [facility] hospital follow-up appointment after discharge.    Is the [facility] hospital follow-up appointment a one-time only appointment? no.  Does the patient have transportation limitations that hinder transportation to clinic appointments? no   SERVICE NEEDED AT [place]         Y = Yes, Blank = No PT: HHPT  OT:   RN:   Equipment:   Other:      Length of Stay:  day(s)   Signed: Arman Filter, MD  PGY-1, Internal Medicine Resident Pager: [phone] (7AM-5PM) 04/28/2014, 10:25 AM

## 2014-04-29 DIAGNOSIS — A047 Enterocolitis due to Clostridium difficile: Secondary | ICD-10-CM | POA: Diagnosis not present

## 2014-04-29 LAB — GLUCOSE, CAPILLARY
GLUCOSE-CAPILLARY: 129 mg/dL — AB (ref 70–99)
Glucose-Capillary: 122 mg/dL — ABNORMAL HIGH (ref 70–99)
Glucose-Capillary: 180 mg/dL — ABNORMAL HIGH (ref 70–99)
Glucose-Capillary: 95 mg/dL (ref 70–99)

## 2014-04-29 LAB — IRON AND TIBC
IRON: 48 ug/dL (ref 42–145)
Saturation Ratios: 14 % — ABNORMAL LOW (ref 20–55)
TIBC: 344 ug/dL (ref 250–470)
UIBC: 296 ug/dL (ref 125–400)

## 2014-04-29 LAB — PROTIME-INR
INR: 2.01 — ABNORMAL HIGH (ref 0.00–1.49)
Prothrombin Time: 22.9 seconds — ABNORMAL HIGH (ref 11.6–15.2)

## 2014-04-29 MED ORDER — WARFARIN SODIUM 5 MG PO TABS
2.5000 mg | ORAL_TABLET | Freq: Once | ORAL | Status: AC
  Administered 2014-04-29: 2.5 mg via ORAL
  Filled 2014-04-29: qty 0.5

## 2014-04-29 NOTE — Progress Notes (Signed)
UR completed 

## 2014-04-29 NOTE — Progress Notes (Addendum)
Subjective:    She reports that her diarrhea has resolved.  She does not have a bowel movement in 2 days. She is tolerating a full diet without issue. She's been working with physical therapy regarding her vertigo, and she is making good progress walking with a rolling walker. She reports that her dizziness has improved, but is not completely resolved. She says that she would be able to stay with her son for a couple of days on discharge, but he would not be able to be around very much because he is busy with work. She is concerned about going home because she lives an hour from her son's house and she doesn't have any support. She thinks that the meclizine has helped with her dizziness.  Interval Events: -Vital signs stable. -Blood sugars stable on glipizide.   Objective:    Vital Signs:   Temp:  [97.7 F (36.5 C)-97.8 F (36.6 C)] 97.7 F (36.5 C) (02/21 0654) Pulse Rate:  [64-76] 76 (02/21 0654) Resp:  [16-18] 16 (02/21 0654) BP: (106-114)/(59-66) 114/66 mmHg (02/21 0654) SpO2:  [98 %] 98 % (02/21 0654) Last BM Date: 04/27/14  24-hour weight change: Weight change:   Intake/Output:  Intake/Output Summary (Last 24 hours) at 04/29/14 0944 Last data filed at 04/28/14 1412  Gross per 24 hour  Intake    600 ml  Output      0 ml  Net    600 ml      Physical Exam: General: Vital signs reviewed and noted. Well-developed, well-nourished, in no acute distress; alert, appropriate and cooperative throughout examination.  Lungs:  Normal respiratory effort. Clear to auscultation BL without crackles or wheezes.  Heart: Irregularly, irregular without gallop, murmur, or rubs.  Abdomen:  BS normoactive. Soft, Nondistended, non-tender.  No masses or organomegaly.  Extremities: No pretibial edema.  Neuro: No focal neurological findings.      Labs:  Basic Metabolic Panel:  Recent Labs Lab 04/26/14 1654 04/26/14 2000 04/27/14 0630 04/27/14 1120  NA 137  --  141  --   K 4.0  --   3.8  --   CL 106  --  108  --   CO2 25  --  28  --   GLUCOSE 107*  --  114*  --   BUN 7  --  5*  --   CREATININE 0.75  --  0.75  --   CALCIUM 9.5  --  9.2  --   MG  --  1.4*  --  1.8    Liver Function Tests:  Recent Labs Lab 04/26/14 1654  AST 27  ALT 18  ALKPHOS 67  BILITOT 0.8  PROT 6.4  ALBUMIN 3.6   CBC:  Recent Labs Lab 04/26/14 1654  WBC 6.5  NEUTROABS 3.2  HGB 13.0  HCT 39.7  MCV 88.0  PLT 307   CBG:  Recent Labs Lab 04/28/14 0757 04/28/14 1146 04/28/14 1715 04/28/14 2138 04/29/14 0757  GLUCAP 114* 164* 104* 122* 129*    Microbiology: Results for orders placed or performed during the hospital encounter of 04/26/14  MRSA PCR Screening     Status: None   Collection Time: 04/26/14  7:43 PM  Result Value Ref Range Status   MRSA by PCR NEGATIVE NEGATIVE Final    Comment:        The GeneXpert MRSA Assay (FDA approved for NASAL specimens only), is one component of a comprehensive MRSA colonization surveillance program. It is not intended to diagnose  MRSA infection nor to guide or monitor treatment for MRSA infections.   Clostridium Difficile by PCR     Status: Abnormal   Collection Time: 04/27/14 10:59 AM  Result Value Ref Range Status   C difficile by pcr POSITIVE (A) NEGATIVE Final    Comment: CRITICAL RESULT CALLED TO, READ BACK BY AND VERIFIED WITH: B. FOUST RN 13:20 04/27/14 ([email or web address])     Coagulation Studies:  Recent Labs  04/26/14 1615 04/27/14 0630 04/28/14 0517 04/29/14 0545  LABPROT  --  22.4* 20.4* 22.9*  INR 2.3 1.95* 1.73* 2.01*    Imaging: No results found.     Medications:    Infusions:    Scheduled Medications: . atenolol  37.5 mg Oral BID  . diltiazem  180 mg Oral Daily  . feeding supplement (ENSURE COMPLETE)  237 mL Oral Q24H  . FLUoxetine  20 mg Oral Daily  . glipiZIDE  2.5 mg Oral QAC breakfast  . levothyroxine  50 mcg Oral QAC breakfast  . losartan  12.5 mg Oral Daily  . vancomycin  125 mg Oral  QID  . Vitamin D (Ergocalciferol)  50,000 Units Oral Q7 days  . Warfarin - Pharmacist Dosing Inpatient   Does not apply q1800    PRN Medications:    Assessment/ Plan:    Principal Problem:   Chronic diarrhea of unknown origin Active Problems:   Hypothyroidism   Diabetes type 2, controlled   Depression   Essential hypertension   Atrial fibrillation   GERD   Diverticulosis   Vertigo   Vitamin D deficiency  #Acute on chronic diarrhea Diarrhea has completely resolved. She's concerned about affording by mouth vancomycin when she goes home. Case management has been consulted to help with obtaining her home meds. -Continue carb-modified diet. -Continue to stop metformin and omeprazole. -Follow-up HIV, folate. -Follow-up ova and parasites and stool norovirus. -She will need a colonoscopy as an outpatient to follow-up previous diverticulitis. -No additional outpatient workup will be necessary unless diarrhea returns. -Follow-up with GI as an outpatient.  #Vertigo Symptoms are improving steadily.  Still most consistent with BPPV.  Safely ambulated with PT.  Anxiety appears to be contributing to her concern with going home. She will have home health PT at discharge and close follow-up in our clinic. Meclizine helping. -Work with PT today. -Ordered home health PT. -Continue meclizine 25 mg 3 times a day when necessary. -Discuss discharge this afternoon versus tomorrow.  #Vitamin D deficiency Vit D25 16.4. -Start ergocalciferol 50,000 units weekly for 4-6 weeks. -Recheck vitamin D 25 at that time.  #Hypomagnesemia Has received magnesium sulfate IV to correct losses due to diarrhea. -Continue to monitor.  #Hypertension Normotensive. -Continue home Cozaar and atenolol. -Continue to hold home Lasix. Restart if needed.  #Type 2 diabetes Good blood sugar control on glipizide instead of sliding scale. Appreciate help from care management regarding obtaining glipizide at  discharge. -Continue glipizide 2.5 mg daily at discharge. -Continue CBGs before meals at bedtime.  #Atrial fibrillation Rate well controlled. -Continue Coumadin per pharmacy. -Continue home diltiazem.  #GERD No reflux symptoms currently. -Continue trial off of PPI.  #Hypothyroidism -Continue home levothyroxine.  #Depression -Continue home Prozac.  #Hyperlipidemia -Continue pravastatin.   DVT PPX - Coumadin with INR goal 2-3  CODE STATUS - Full.  CONSULTS PLACED - None.  DISPO - hopeful discharge this afternoon.  The patient does have a current PCP Aundra Dubin, Nino Glow, MD) and does need an [facility] hospital follow-up appointment after discharge.  Is the [facility] hospital follow-up appointment a one-time only appointment? no.  Does the patient have transportation limitations that hinder transportation to clinic appointments? no   SERVICE NEEDED AT [place]         Y = Yes, Blank = No PT: HHPT  OT:   RN:   Equipment:   Other:      Length of Stay:  day(s)   Signed: Arman Filter, MD  PGY-1, Internal Medicine Resident Pager: [phone] (7AM-5PM) 04/29/2014, 9:44 AM     --Addendum-- Arman Filter, MD, PhD Internal Medicine Intern Pager: [phone] 04/29/2014,11:30 AM  Case manager unable to obtain oral vancomycin today. She needs to wait until tomorrow to discuss with insurance company. [place] may pick up the medication if it remains too expensive. -Likely discharged tomorrow.

## 2014-04-29 NOTE — Progress Notes (Signed)
ANTICOAGULATION CONSULT NOTE - Follow Up Consult  Pharmacy Consult for coumadin Indication: atrial fibrillation  Allergies  Allergen Reactions  . Gemfibrozil Swelling    REACTION: Angioedema  . Latex Other (See Comments)    Blisters where touched or applied  . Penicillins Hives    Will spread in patches all over the body.  . Adhesive [Tape] Other (See Comments)    Will blister skin where applied - do not use BAND-AIDS.  Marland Kitchen Codeine Hives    Will spread in patches all over the body.  . Digoxin And Related     Makes BP drop  . Ace Inhibitors Rash    Cough and rash     Patient Measurements: Height: 5\' 3"  (160 cm) Weight: 164 lb (74.39 kg) IBW/kg (Calculated) : 52.4  Vital Signs: Temp: 97.7 F (36.5 C) (02/21 0654) Temp Source: Oral (02/21 0654) BP: 114/66 mmHg (02/21 0654) Pulse Rate: 76 (02/21 0654)  Labs:  Recent Labs  04/26/14 1654 04/27/14 0630 04/28/14 0517 04/29/14 0545  HGB 13.0  --   --   --   HCT 39.7  --   --   --   PLT 307  --   --   --   LABPROT  --  22.4* 20.4* 22.9*  INR  --  1.95* 1.73* 2.01*  CREATININE 0.75 0.75  --   --     Estimated Creatinine Clearance: 67.7 mL/min (by C-G formula based on Cr of 0.75).   Medical History: Past Medical History  Diagnosis Date  . Depression   . Obesity   . Skin neoplasm   . Bunion   . Carotid stenosis   . Fibromyalgia   . Internal hemorrhoid   . GERD (gastroesophageal reflux disease)   . Chronic gastritis   . Transaminase or LDH elevation   . Mild cognitive impairment   . Hyperlipidemia   . Fatty liver   . Dysphagia     no documented strictures but responded positively to dilation in past.   . Hypertension   . Cholecystitis     s/p cholecystectomy  . Sarcoidosis   . Fibromyalgia   . CHF (congestive heart failure)   . Atrial fibrillation   . Exertional dyspnea   . Hypothyroidism   . Hypothyroid   . Diverticulosis   . Digoxin toxicity     08/2011  . Bradycardia, drug induced     08/2011  (dig)  . Drug-induced hypotension     08/2011 (Dig)  . Carpal tunnel syndrome     mild   . Dysrhythmia     HX OF ATRIAL FIBRILATION  . Diverticulitis     dx 04/08/14  . Family history of adverse reaction to anesthesia     "my mother may have"  . Pneumonia X 2  . OSA (obstructive sleep apnea) dx'd 2003    "can't sleep w/that mask; lost some weight; maybe that's helped" (04/26/2014)  . Diabetic peripheral neuropathy   . Type II diabetes mellitus   . H/O hiatal hernia     S/P hernia repair  . Epileptic seizure, tonic     .No meds since age of 59.  Marland Kitchen TIA (transient ischemic attack)     "don't know when I had it" (04/26/2014)  . Osteoarthritis   . Chronic back pain   . Skin cancer 1990's    "front of my right leg"    Medications:  Prescriptions prior to admission  Medication Sig Dispense Refill Last Dose  .  Blood Glucose Calibration (ACCU-CHEK SMARTVIEW CONTROL) LIQD 1 Device by In Vitro route as needed. Use to perform control/ calibration on accu-chek meter. diag code E11.9. Non insulin dependent 3 each 0 Past Month at Unknown time  . Lancets 30G MISC DX Code: E11.9 Check blood sugars one time per day. Accucheck fastclix lancets 100 each 11 Past Month at Unknown time  . Probiotic Product (TRUNATURE DIGESTIVE PROBIOTIC PO) Take 1 capsule by mouth daily.   04/26/2014 at unknown  . tiZANidine (ZANAFLEX) 4 MG tablet Take 1 tablet (4 mg total) by mouth every 12 (twelve) hours as needed for muscle spasms. (Patient taking differently: Take 2 mg by mouth every 12 (twelve) hours as needed for muscle spasms. ) 180 tablet 0 Past Week at Unknown time  . atenolol (TENORMIN) 25 MG tablet Take 1.5 tablets (37.5 mg total) by mouth 2 (two) times daily. 270 tablet 1 04/26/2014 at 0900  . diltiazem (CARDIZEM CD) 180 MG 24 hr capsule Take 1 capsule (180 mg total) by mouth daily. 90 capsule 1 04/26/2014 at unknown  . diphenoxylate-atropine (LOMOTIL) 2.5-0.025 MG per tablet Take 1 tablet by mouth 4 (four)  times daily as needed for diarrhea or loose stools. (Patient not taking: Reported on 04/26/2014) 30 tablet 0 Not Taking at Unknown time  . FLUoxetine (PROZAC) 20 MG capsule Take 1 capsule (20 mg total) by mouth daily. 90 capsule 2 04/26/2014 at unknown  . furosemide (LASIX) 20 MG tablet Take 1 tablet (20 mg total) by mouth daily. 90 tablet 1 04/26/2014 at unknown  . levothyroxine (SYNTHROID, LEVOTHROID) 50 MCG tablet Take 1 tablet (50 mcg total) by mouth every morning. 90 tablet 1 04/26/2014 at unknown  . losartan (COZAAR) 25 MG tablet Take 0.5 tablets (12.5 mg total) by mouth daily. 90 tablet 1 04/26/2014 at unknown  . omeprazole (PRILOSEC) 20 MG capsule Take 1 capsule (20 mg total) by mouth daily. 90 capsule 1 04/26/2014 at unknown  . potassium chloride (K-DUR) 10 MEQ tablet Take 1 tablet (10 mEq total) by mouth daily. 90 tablet 1 04/26/2014 at unknown  . pravastatin (PRAVACHOL) 40 MG tablet Take 1 tablet (40 mg total) by mouth daily. 90 tablet 1 04/26/2014 at unknown  . warfarin (COUMADIN) 5 MG tablet Take 0.5 tablets (2.5 mg total) by mouth at bedtime. Take 2.5mg  daily until further instructed. (Patient taking differently: Take 2.5 mg by mouth at bedtime. ) 90 tablet 0 04/25/2014 at unknown  . [DISCONTINUED] metFORMIN (GLUCOPHAGE) 1000 MG tablet TAKE ONE TABLET TWICE A DAY WITH FOOD 180 tablet 1 04/26/2014 at unknown   Scheduled:  . atenolol  37.5 mg Oral BID  . diltiazem  180 mg Oral Daily  . feeding supplement (ENSURE COMPLETE)  237 mL Oral Q24H  . FLUoxetine  20 mg Oral Daily  . glipiZIDE  2.5 mg Oral QAC breakfast  . levothyroxine  50 mcg Oral QAC breakfast  . losartan  12.5 mg Oral Daily  . vancomycin  125 mg Oral QID  . Vitamin D (Ergocalciferol)  50,000 Units Oral Q7 days  . Warfarin - Pharmacist Dosing Inpatient   Does not apply q1800    Assessment: 64 yo female here with near syncope and Clostridium difficile diarrhea. She is on chronic anticoagulation with Coumadin for atrial  fibrillation and her INR is now therapeutic.   Note oral Vancomycin was started for CDI which has a lower potential for interaction with Coumadin as compared to Metronidazole.  Home coumadin dose: She reports taking 5mg  coumadin on  2/13 and 2.5mg /day since then (last dose 04/25/14).  Her prior to admission regimen was not stable at the time of admission due to recent antibiotic therapy.  Goal of Therapy:  INR 2-3 Monitor platelets by anticoagulation protocol: Yes   Plan:  -Coumadin 2.5 mg po today -Daily PT/INR  Legrand Como, Pharm.D., BCPS, AAHIVP Clinical Pharmacist Phone: [phone] or [phone] 04/29/2014, 10:11 AM

## 2014-04-29 NOTE — Progress Notes (Signed)
Physical Therapy Treatment Patient Details Name: [name] MRN: [id number] DOB: 07-25-1950 Today's Date: 04/29/2014    History of Present Illness 64 yo female with DM II, HTN, OA, Afib on coumadin, CHF, hypothyroidism, stroke, presenting from clinic with chronic diarrhea and lightheadedness    PT Comments    Patient making improvements with mobility and gait - at mod I level today.  Independent with use of visual fixation to decrease dizziness during mobility.  Patient reports dizziness/lightheadedness has improved, but is still present.  Patient appears very anxious today - provided encouragement and validated improvement.  From PT perspective, patient is safe to d/c home with f/u HHPT for vestibular rehab (patient reports being scared about going home).   Follow Up Recommendations  Home health PT (for Vestibular Rehab)     Equipment Recommendations  Rolling walker with 5" wheels    Recommendations for Other Services       Precautions / Restrictions Precautions Precautions: Fall Precaution Comments: Vertigo Restrictions Weight Bearing Restrictions: No    Mobility  Bed Mobility Overal bed mobility: Modified Independent             General bed mobility comments: Cues to use visual fixation and move slowly through transition  Transfers Overall transfer level: Needs assistance Equipment used: Rolling walker (2 wheeled) Transfers: Sit to/from Stand Sit to Stand: Modified independent (Device/Increase time)         General transfer comment: Cues to use visual fixation and move slowly to standing.  Patient stands for several seconds prior to ambulation for safety.  Ambulation/Gait Ambulation/Gait assistance: Modified independent (Device/Increase time) Ambulation Distance (Feet): 200 Feet Assistive device: Rolling walker (2 wheeled) Gait Pattern/deviations: Step-through pattern;Decreased step length - right;Decreased step length - left;Decreased stride  length Gait velocity: Decreased Gait velocity interpretation: Below normal speed for age/gender General Gait Details: Patient with slow, guarded gait.  Cues for visual fixation and turning technique   Stairs            Wheelchair Mobility    Modified Rankin (Stroke Patients Only)       Balance                                    Cognition Arousal/Alertness: Awake/alert Behavior During Therapy: Anxious Overall Cognitive Status: Within Functional Limits for tasks assessed                      Exercises      General Comments        Pertinent Vitals/Pain Pain Assessment: 0-10 Pain Score: 4  Pain Location: back Pain Descriptors / Indicators: Aching Pain Intervention(s): Monitored during session;Repositioned    Home Living                      Prior Function            PT Goals (current goals can now be found in the care plan section) Progress towards PT goals: Progressing toward goals    Frequency  Min 3X/week    PT Plan Current plan remains appropriate    Co-evaluation             End of Session Equipment Utilized During Treatment: Gait belt Activity Tolerance: Patient tolerated treatment well Patient left: in bed;with call bell/phone within reach     Time: 5465-0354 PT Time Calculation (min) (ACUTE ONLY): 35 min  Charges:  $  Gait Training: 8-22 mins $Therapeutic Activity: 8-22 mins                    G Codes:  Functional Assessment Tool Used: Clinical judgement Functional Limitation: Mobility: Walking and moving around Mobility: Walking and Moving Around Goal Status [phone]): At least 1 percent but less than 20 percent impaired, limited or restricted Mobility: Walking and Moving Around Discharge Status [phone]): At least 1 percent but less than 20 percent impaired, limited or restricted   [name] 04/29/2014, 9:51 AM [name], [place] Pager [phone]

## 2014-04-29 NOTE — Care Management Note (Signed)
Page 2 of 2   04/30/2014     11:49:42 AM CARE MANAGEMENT NOTE 04/30/2014  Patient:  [name], [name]   Account Number:  [id number]  Date Initiated:  04/27/2014  Documentation initiated by:  Magdalen Spatz  Subjective/Objective Assessment:   adm: diarrhea/C.diff     Action/Plan:   discharge planning   Anticipated DC Date:  04/30/2014   Anticipated DC Plan:  [place]  CM consult  Medication Assistance      Choice offered to / List presented to:  C-1 Patient   DME arranged  [place]      DME agency  [place]     HH arranged  HH-2 PT      [place].   Status of service:  In process, will continue to follow Medicare Important Message given?   (If response is "NO", the following Medicare IM given date fields will be blank) Date Medicare IM given:   Medicare IM given by:   Date Additional Medicare IM given:   Additional Medicare IM given by:    Discharge Disposition:  [place]  Per UR Regulation:    If discussed at Long Length of Stay Meetings, dates discussed:    Comments:  04-30-14 VANCOMYCIN ( VANCOCIN ) 50 MG/ML ORAL SOLUTION 125 MG 4 TIMES DAILY X 11 DAYS  COVER- YES CO-PAY- $331.00  FOR 11 DAYS SUPPLY TIER-5 DRUG PRIOR APPROVAL- NO PHARMCAY - [facility]  PATIENT DEDUCTILBE IS $ 300.00 MET-NO WHEN MET 26 %. MEDICINE  [facility] $421.0   Patient unable to afford co pay . [facility] approved filling prescription through [employer] fund . Prescription sent to pharmacy , pharmacy will call bedside nurse Hinton Dyer when ready. [place] aware. Magdalen Spatz RN BSN     04/29/14 09:00 CM received call from MD requesting the coverage of oral vancomycin.  CM explained insurance companies are not open on the weekend to run a benefits check.  CM called both [facility] and [facility] to see if they could give me an estimate on cost. Unfortunately, neither can run a  "tentative script" bc of cost and bc pt is not straight Medicare and is a Gold Plan, the cost could be range from " reasonable to the extreme of 500-900".  Pt's daughter is a Insurance risk surveyor and estimates in the [place] but is also unsure.  Pt has a 200.00 deductible but cannot confirm if deductible has been met or if it applies to certain classes of medications.  CM called main pharmacy and spoke with Meghan to see if she had any suggestions and, unfortunately, without knowing coverage does not have a solution. Pt does NOT qualify for MATCH.  MD notified.  Will run a benefits check tomorrow to get an exact cost then pursue an avenue to cover the cost. Waiting on benefits check for cost of vancomycin.  Mariane Masters, BSN, [facility] Lesch [phone].    04-27-14 Confirmed face sheet information with patient . Provided list of agencies for home health , patient prefers [facility] , referral given to [facility] with [facility].  PT recommended rolling walker , same ordered through [place] due to insurance.  Spoke with Jeneen Rinks with [facility] [phone] , [employer] can deliver the walker to patient at hospital in 4 hours or deliver to her home in [place] . Patient prefers walker to be delivered to hospital ,  [facility] aware. Orders faxed to [facility] at [phone] .  Magdalen Spatz RN BSN [phone]

## 2014-04-30 DIAGNOSIS — A047 Enterocolitis due to Clostridium difficile: Principal | ICD-10-CM

## 2014-04-30 LAB — OVA AND PARASITE EXAMINATION: Special Requests: NORMAL

## 2014-04-30 LAB — HIV ANTIBODY (ROUTINE TESTING W REFLEX): HIV Screen 4th Generation wRfx: NONREACTIVE

## 2014-04-30 LAB — PROTIME-INR
INR: 2.19 — ABNORMAL HIGH (ref 0.00–1.49)
Prothrombin Time: 24.6 seconds — ABNORMAL HIGH (ref 11.6–15.2)

## 2014-04-30 LAB — FOLATE RBC
Folate, Hemolysate: 355.8 ng/mL
Folate, RBC: 972 ng/mL (ref >498)
Hematocrit: 36.6 % (ref 34.0–46.6)

## 2014-04-30 LAB — GLUCOSE, CAPILLARY
GLUCOSE-CAPILLARY: 86 mg/dL (ref 70–99)
GLUCOSE-CAPILLARY: 132 mg/dL — AB (ref 70–99)

## 2014-04-30 MED ORDER — VANCOMYCIN 50 MG/ML ORAL SOLUTION: 125.0000 mg | Freq: Four times a day (QID) | ORAL | Status: AC

## 2014-04-30 MED ORDER — MECLIZINE HCL 25 MG PO TABS: 25.0000 mg | ORAL_TABLET | Freq: Three times a day (TID) | ORAL | Status: DC | PRN

## 2014-04-30 MED ORDER — FAMOTIDINE 20 MG PO TABS
20.0000 mg | ORAL_TABLET | Freq: Every day | ORAL | Status: DC
  Administered 2014-04-30: 20 mg via ORAL
  Filled 2014-04-30: qty 1

## 2014-04-30 MED ORDER — VITAMIN D (ERGOCALCIFEROL) 1.25 MG (50000 UNIT) PO CAPS: 50000.0000 [IU] | ORAL_CAPSULE | ORAL | Status: DC

## 2014-04-30 MED ORDER — WARFARIN SODIUM 5 MG PO TABS: 5.0000 mg | ORAL_TABLET | Freq: Once | ORAL | Status: DC

## 2014-04-30 MED ORDER — ENSURE COMPLETE PO LIQD: 237.0000 mL | ORAL | Status: DC

## 2014-04-30 MED ORDER — FAMOTIDINE 20 MG PO TABS
20.0000 mg | ORAL_TABLET | Freq: Every day | ORAL | Status: DC
Start: 2014-04-30 — End: 2014-05-07

## 2014-04-30 MED ORDER — WARFARIN SODIUM 2.5 MG PO TABS: 2.5000 mg | ORAL_TABLET | Freq: Every day | ORAL | Status: DC

## 2014-04-30 MED ORDER — GLIPIZIDE 5 MG PO TABS: 2.5000 mg | ORAL_TABLET | Freq: Every day | ORAL | Status: DC

## 2014-04-30 NOTE — Progress Notes (Signed)
ANTICOAGULATION CONSULT NOTE - Follow Up Consult  Pharmacy Consult for coumadin Indication: atrial fibrillation  Allergies  Allergen Reactions  . Gemfibrozil Swelling    REACTION: Angioedema  . Latex Other (See Comments)    Blisters where touched or applied  . Penicillins Hives    Will spread in patches all over the body.  . Adhesive [Tape] Other (See Comments)    Will blister skin where applied - do not use BAND-AIDS.  Marland Kitchen Codeine Hives    Will spread in patches all over the body.  . Digoxin And Related     Makes BP drop  . Ace Inhibitors Rash    Cough and rash     Patient Measurements: Height: 5\' 3"  (160 cm) Weight: 164 lb (74.39 kg) IBW/kg (Calculated) : 52.4  Vital Signs: Temp: 97.8 F (36.6 C) (02/22 0536) BP: 114/67 mmHg (02/22 0536) Pulse Rate: 70 (02/22 0536)  Labs:  Recent Labs  04/28/14 0517 04/29/14 0545 04/30/14 0523  LABPROT 20.4* 22.9* 24.6*  INR 1.73* 2.01* 2.19*    Estimated Creatinine Clearance: 67.7 mL/min (by C-G formula based on Cr of 0.75).   Medical History: Past Medical History  Diagnosis Date  . Depression   . Obesity   . Skin neoplasm   . Bunion   . Carotid stenosis   . Fibromyalgia   . Internal hemorrhoid   . GERD (gastroesophageal reflux disease)   . Chronic gastritis   . Transaminase or LDH elevation   . Mild cognitive impairment   . Hyperlipidemia   . Fatty liver   . Dysphagia     no documented strictures but responded positively to dilation in past.   . Hypertension   . Cholecystitis     s/p cholecystectomy  . Sarcoidosis   . Fibromyalgia   . CHF (congestive heart failure)   . Atrial fibrillation   . Exertional dyspnea   . Hypothyroidism   . Hypothyroid   . Diverticulosis   . Digoxin toxicity     08/2011  . Bradycardia, drug induced     08/2011 (dig)  . Drug-induced hypotension     08/2011 (Dig)  . Carpal tunnel syndrome     mild   . Dysrhythmia     HX OF ATRIAL FIBRILATION  . Diverticulitis     dx  04/08/14  . Family history of adverse reaction to anesthesia     "my mother may have"  . Pneumonia X 2  . OSA (obstructive sleep apnea) dx'd 2003    "can't sleep w/that mask; lost some weight; maybe that's helped" (04/26/2014)  . Diabetic peripheral neuropathy   . Type II diabetes mellitus   . H/O hiatal hernia     S/P hernia repair  . Epileptic seizure, tonic     .No meds since age of 55.  Marland Kitchen TIA (transient ischemic attack)     "don't know when I had it" (04/26/2014)  . Osteoarthritis   . Chronic back pain   . Skin cancer 1990's    "front of my right leg"   Assessment: 64 yo female here with near syncope and Clostridium difficile diarrhea. She is on chronic anticoagulation with Coumadin for atrial fibrillation.  She did not have a stable PTA warfarin dose d/t antibiotic therapy. She was last seen on 2/11 by Paulla Dolly in clinic during which he documented she was to take 5mg  on Thurs 2/11, then 2.5 on Friday, 5mg  on Saturday, 2.5mg  on Sunday and to return Monday for an INR  check. The clinic was closed that Monday due to weather and she did not come to clinic when it reopened on Tuesday.  She reported taking 5mg  coumadin on 2/13 and 2.5mg /day since then (last dose 04/25/14).   Note oral Vancomycin was started for CDI which has a lower potential for interaction with Coumadin as compared to Metronidazole.  INR this morning is in therapeutic range at 2.19. No other labs have been collected since 2/18.  Goal of Therapy:  INR 2-3 Monitor platelets by anticoagulation protocol: Yes   Plan:  -Coumadin 5 mg po x1 today -Daily PT/INR -CBC in the morning   D. , PharmD, BCPS Clinical Pharmacist Pager: [phone] 04/30/2014 10:37 AM

## 2014-04-30 NOTE — Progress Notes (Signed)
Internal Medicine Attending  Date: 04/30/2014  Patient name: [name] Medical record number: [id number] Date of birth: 07/02/1950 Age: 64 y.o. Gender: female  I saw and evaluated the patient. I reviewed the resident's note by Dr. Trudee Kuster and I agree with the resident's findings and plans as documented in his progress note.  Diarrhea related to C. diff has resolved on oral vancomycin which was started over flagyl secondary to less interaction with warfarin.  Financing of the oral vancomycin has been worked out and [name] is ready for discharge home today with follow-up in the [facility].

## 2014-04-30 NOTE — Progress Notes (Signed)
Subjective:    She reports continued improvement of her vertigo. She notices it most with abrupt movements, including when she gets up to use the bathroom. It also appears to be worse in the morning. She had 2 loose formed bowel movements yesterday after eating more fiber, but she denies any recurrent watery diarrhea. She has made arrangements with her friend who lives close to her home. Her friend says she can come check on her a few times per day. She says today would be a good day to go home, because her son is off work. She does report some reflux after eating breakfast this morning.  Interval Events: -Vital signs stable. -Case manager could not secure oral vancomycin on discharge yesterday, so she was kept overnight. They plan to discuss with her insurance and [facility] outpatient pharmacy today.    Objective:    Vital Signs:   Temp:  [97.8 F (36.6 C)-98.1 F (36.7 C)] 97.8 F (36.6 C) (02/22 0536) Pulse Rate:  [70-82] 70 (02/22 0536) Resp:  [16-18] 18 (02/22 0536) BP: (114-132)/(62-88) 114/67 mmHg (02/22 0536) SpO2:  [97 %-100 %] 100 % (02/22 0536) Last BM Date: 04/30/14  24-hour weight change: Weight change:   Intake/Output:  Intake/Output Summary (Last 24 hours) at 04/30/14 2440 Last data filed at 04/29/14 1828  Gross per 24 hour  Intake   1080 ml  Output      0 ml  Net   1080 ml      Physical Exam: General: Vital signs reviewed and noted. Well-developed, well-nourished, in no acute distress; alert, appropriate and cooperative throughout examination.  Lungs:  Normal respiratory effort. Clear to auscultation BL without crackles or wheezes.  Heart: Irregularly, irregular without gallop, murmur, or rubs.  Abdomen:  BS normoactive. Soft, Nondistended, non-tender.  No masses or organomegaly.  Extremities: No pretibial edema.  Neuro: No focal neurological findings.      Labs:  Basic Metabolic Panel:  Recent Labs Lab 04/26/14 1654 04/26/14 2000 04/27/14 0630  04/27/14 1120  NA 137  --  141  --   K 4.0  --  3.8  --   CL 106  --  108  --   CO2 25  --  28  --   GLUCOSE 107*  --  114*  --   BUN 7  --  5*  --   CREATININE 0.75  --  0.75  --   CALCIUM 9.5  --  9.2  --   MG  --  1.4*  --  1.8    Liver Function Tests:  Recent Labs Lab 04/26/14 1654  AST 27  ALT 18  ALKPHOS 67  BILITOT 0.8  PROT 6.4  ALBUMIN 3.6   CBC:  Recent Labs Lab 04/26/14 1654  WBC 6.5  NEUTROABS 3.2  HGB 13.0  HCT 39.7  MCV 88.0  PLT 307   CBG:  Recent Labs Lab 04/29/14 0757 04/29/14 1146 04/29/14 1655 04/29/14 2202 04/30/14 0819  GLUCAP 129* 122* 180* 95 86    Microbiology: Results for orders placed or performed during the hospital encounter of 04/26/14  MRSA PCR Screening     Status: None   Collection Time: 04/26/14  7:43 PM  Result Value Ref Range Status   MRSA by PCR NEGATIVE NEGATIVE Final    Comment:        The GeneXpert MRSA Assay (FDA approved for NASAL specimens only), is one component of a comprehensive MRSA colonization surveillance program. It is not intended  to diagnose MRSA infection nor to guide or monitor treatment for MRSA infections.   Clostridium Difficile by PCR     Status: Abnormal   Collection Time: 04/27/14 10:59 AM  Result Value Ref Range Status   C difficile by pcr POSITIVE (A) NEGATIVE Final    Comment: CRITICAL RESULT CALLED TO, READ BACK BY AND VERIFIED WITH: B. FOUST RN 13:20 04/27/14 ([email or web address])     Coagulation Studies:  Recent Labs  04/28/14 0517 04/29/14 0545 04/30/14 0523  LABPROT 20.4* 22.9* 24.6*  INR 1.73* 2.01* 2.19*    Imaging: No results found.     Medications:    Infusions:    Scheduled Medications: . atenolol  37.5 mg Oral BID  . diltiazem  180 mg Oral Daily  . feeding supplement (ENSURE COMPLETE)  237 mL Oral Q24H  . FLUoxetine  20 mg Oral Daily  . glipiZIDE  2.5 mg Oral QAC breakfast  . levothyroxine  50 mcg Oral QAC breakfast  . losartan  12.5 mg Oral Daily  .  vancomycin  125 mg Oral QID  . Vitamin D (Ergocalciferol)  50,000 Units Oral Q7 days  . Warfarin - Pharmacist Dosing Inpatient   Does not apply q1800    PRN Medications:    Assessment/ Plan:    Principal Problem:   Chronic diarrhea of unknown origin Active Problems:   Hypothyroidism   Diabetes type 2, controlled   Depression   Essential hypertension   Atrial fibrillation   GERD   Diverticulosis   BPPV (benign paroxysmal positional vertigo)   Vitamin D deficiency  #Acute on chronic diarrhea 2 loose bowel movements yesterday, but they were formed. Case management working on getting oral vancomycin. -Continue carb-modified diet. -Stop metformin and omeprazole on discharge. -Follow-up HIV, folate. -Follow-up ova and parasites and stool norovirus. -She will need a colonoscopy as an outpatient to follow-up previous diverticulitis. -Follow-up with GI as an outpatient.  -Hopeful discharge today.  #Vertigo Symptoms continue to improve slowly. She is okay to go home from physical therapy's perspective. She is less anxious about going home today, and she says she is ready. -Work with PT. -Home health PT on discharge. -Continue meclizine 25 mg 3 times a day when necessary.  #Type 2 diabetes Good blood sugar control on glipizide. -Continue glipizide 2.5 mg daily at discharge. -Continue CBGs before meals at bedtime.  #Vitamin D deficiency -Continue ergocalciferol 50,000 units weekly for 4-6 weeks. -Recheck vitamin D 25 at that time.  #Hypertension Normotensive. -Continue home Cozaar and atenolol. -Continue to hold home Lasix. Restart if needed.  #Atrial fibrillation Rate well controlled. -Continue Coumadin per pharmacy. -Continue home diltiazem.  #GERD Some reflux after breakfast. -Start Pepcid 20 mg daily.  #Hypothyroidism -Continue home levothyroxine.  #Depression -Continue home Prozac.  #Hyperlipidemia -Continue pravastatin.   DVT PPX - Coumadin with INR  goal 2-3  CODE STATUS - Full.  CONSULTS PLACED - None.  DISPO - hopeful discharge this afternoon.  The patient does have a current PCP Aundra Dubin, Nino Glow, MD) and does need an [facility] hospital follow-up appointment after discharge.    Is the [facility] hospital follow-up appointment a one-time only appointment? no.  Does the patient have transportation limitations that hinder transportation to clinic appointments? no   SERVICE NEEDED AT [facility]         Y = Yes, Blank = No PT: HHPT  OT:   RN:   Equipment:   Other:  Length of Stay:  day(s)   Signed: Arman Filter, MD  PGY-1, Internal Medicine Resident Pager: [phone] (7AM-5PM) 04/30/2014, 8:33 AM

## 2014-04-30 NOTE — Progress Notes (Signed)
D/c to home via wheelchair. Breathing regular and unlabored on room air.  Pain denied. VSS. D/c instructions and prescriptions along with meds from pharmacy given and explained to patient with understanding demonstrated

## 2014-04-30 NOTE — Discharge Instructions (Signed)
Thank you for allowing [place] to be involved in your healthcare while you were hospitalized at [facility].   Please note that there have been changes to your home medications.  --> PLEASE LOOK AT YOUR DISCHARGE MEDICATION LIST FOR DETAILS.   Please call your PCP if you have any questions or concerns, or any difficulty getting any of your medications.  Please return to the ER if you have worsening of your symptoms or new severe symptoms arise.  It is most important that you continue to take your oral vancomycin for 11 more days. You should finish her medication on 05/11/2014.  Stop taking metformin and omeprazole because of your diarrhea.  We have started you on glipizide for your diabetes instead of metformin.  We have switched you from omeprazole to famotidine for your reflux.  You can take meclizine for your dizziness.  This should continue to improve.  It is from Benign Positional Vertigo.  For your Coumadin, take 5 mg on Monday, Wednesday, and Friday. Take 2.5 mg on Saturday, Sunday, Tuesday, and Thursday. Make sure to follow-up with Dr. Elie Confer at your clinic appointment.  Benign Positional Vertigo Vertigo means you feel like you or your surroundings are moving when they are not. Benign positional vertigo is the most common form of vertigo. Benign means that the cause of your condition is not serious. Benign positional vertigo is more common in older adults. CAUSES  Benign positional vertigo is the result of an upset in the labyrinth system. This is an area in the middle ear that helps control your balance. This may be caused by a viral infection, head injury, or repetitive motion. However, often no specific cause is found. SYMPTOMS  Symptoms of benign positional vertigo occur when you move your head or eyes in different directions. Some of the symptoms may include:  Loss of balance and falls.  Vomiting.  Blurred vision.  Dizziness.  Nausea.  Involuntary eye  movements (nystagmus). DIAGNOSIS  Benign positional vertigo is usually diagnosed by physical exam. If the specific cause of your benign positional vertigo is unknown, your caregiver may perform imaging tests, such as magnetic resonance imaging (MRI) or computed tomography (CT). TREATMENT  Your caregiver may recommend movements or procedures to correct the benign positional vertigo. Medicines such as meclizine, benzodiazepines, and medicines for nausea may be used to treat your symptoms. In rare cases, if your symptoms are caused by certain conditions that affect the inner ear, you may need surgery. HOME CARE INSTRUCTIONS   Follow your caregiver's instructions.  Move slowly. Do not make sudden body or head movements.  Avoid driving.  Avoid operating heavy machinery.  Avoid performing any tasks that would be dangerous to you or others during a vertigo episode.  Drink enough fluids to keep your urine clear or pale yellow. SEEK IMMEDIATE MEDICAL CARE IF:   You develop problems with walking, weakness, numbness, or using your arms, hands, or legs.  You have difficulty speaking.  You develop severe headaches.  Your nausea or vomiting continues or gets worse.  You develop visual changes.  Your family or friends notice any behavioral changes.  Your condition gets worse.  You have a fever.  You develop a stiff neck or sensitivity to light. MAKE SURE YOU:   Understand these instructions.  Will watch your condition.  Will get help right away if you are not doing well or get worse. Document Released: 12/01/2005 Document Revised: 05/18/2011 Document Reviewed: 11/13/2010 Exit[employer] Patient Information 2015 [employer]. This information is not intended to replace advice given to you by your health care provider. Make sure you discuss any questions you have with your health care provider.  Famotidine tablets or gelcaps What is this medicine? FAMOTIDINE (fa MOE ti deen) is a type of  antihistamine that blocks the release of stomach acid. It is used to treat stomach or intestinal ulcers. It can also relieve heartburn from acid reflux. This medicine may be used for other purposes; ask your health care provider or pharmacist if you have questions. COMMON BRAND NAME(S): Heartburn Relief, Pepcid, Pepcid AC, Pepcid AC Maximum Strength What should I tell my health care provider before I take this medicine? They need to know if you have any of these conditions: -kidney or liver disease -trouble swallowing -an unusual or allergic reaction to famotidine, other medicines, foods, dyes, or preservatives -pregnant or trying to get pregnant -breast-feeding How should I use this medicine? Take this medicine by mouth with a glass of water. Follow the directions on the prescription label. If you only take this medicine once a day, take it at bedtime. Take your doses at regular intervals. Do not take your medicine more often than directed. Talk to your pediatrician regarding the use of this medicine in children. Special care may be needed. Overdosage: If you think you have taken too much of this medicine contact a poison control center or emergency room at once. NOTE: This medicine is only for you. Do not share this medicine with others. What if I miss a dose? If you miss a dose, take it as soon as you can. If it is almost time for your next dose, take only that dose. Do not take double or extra doses. What may interact with this medicine? -delavirdine -itraconazole -ketoconazole This list may not describe all possible interactions. Give your health care provider a list of all the medicines, herbs, non-prescription drugs, or dietary supplements you use. Also tell them if you smoke, drink alcohol, or use illegal drugs. Some items may interact with your medicine. What should I watch for while using this medicine? Tell your doctor or health care professional if your condition does not start to  get better or if it gets worse. Finish the full course of tablets prescribed, even if you feel better. Do not take with aspirin, ibuprofen or other antiinflammatory medicines. These can make your condition worse. Do not smoke cigarettes or drink alcohol. These cause irritation in your stomach and can increase the time it will take for ulcers to heal. If you get black, tarry stools or vomit up what looks like coffee grounds, call your doctor or health care professional at once. You may have a bleeding ulcer. What side effects may I notice from receiving this medicine? Side effects that you should report to your doctor or health care professional as soon as possible: -agitation, nervousness -confusion -hallucinations -skin rash, itching Side effects that usually do not require medical attention (report to your doctor or health care professional if they continue or are bothersome): -constipation -diarrhea -dizziness -headache This list may not describe all possible side effects. Call your doctor for medical advice about side effects. You may report side effects to FDA at 1-800-FDA-1088. Where should I keep my medicine? Keep out of the reach of children. Store at room temperature between 15 and 30 degrees C (59 and 86 degrees F). Do not freeze. Throw away any unused medicine after the expiration date. NOTE: This sheet is a summary. It  may not cover all possible information. If you have questions about this medicine, talk to your doctor, pharmacist, or health care provider.  2015, [employer] Standard. (2012-06-15 14:45:49) Meclizine tablets or capsules What is this medicine? MECLIZINE (MEK li zeen) is an antihistamine. It is used to prevent nausea, vomiting, or dizziness caused by motion sickness. It is also used to prevent and treat vertigo (extreme dizziness or a feeling that you or your surroundings are tilting or spinning around). This medicine may be used for other purposes; ask your  health care provider or pharmacist if you have questions. COMMON BRAND NAME(S): Antivert, Dramamine Less Drowsy, Medivert, Meni-D What should I tell my health care provider before I take this medicine? They need to know if you have any of these conditions: -asthma -glaucoma -prostate trouble -stomach problems -urinary problems -an unusual or allergic reaction to meclizine, other medicines, foods, dyes, or preservatives -pregnant or trying to get pregnant -breast-feeding How should I use this medicine? Take this medicine by mouth with a glass of water. Follow the directions on the prescription label. If you are using this medicine to prevent motion sickness, take the dose at least 1 hour before travel. If it upsets your stomach, take it with food or milk. Take your doses at regular intervals. Do not take your medicine more often than directed. Talk to your pediatrician regarding the use of this medicine in children. Special care may be needed. Overdosage: If you think you have taken too much of this medicine contact a poison control center or emergency room at once. NOTE: This medicine is only for you. Do not share this medicine with others. What if I miss a dose? If you miss a dose, take it as soon as you can. If it is almost time for your next dose, take only that dose. Do not take double or extra doses. What may interact with this medicine? -barbiturate medicines for inducing sleep or treating seizures -digoxin -medicines for anxiety or sleeping problems, like alprazolam, diazepam or temazepam -medicines for hay fever and other allergies -medicines for mental depression -medicines for movement abnormalities as in Parkinson's disease, or for stomach problems -medicines for pain -medicines that relax muscles This list may not describe all possible interactions. Give your health care provider a list of all the medicines, herbs, non-prescription drugs, or dietary supplements you use. Also  tell them if you smoke, drink alcohol, or use illegal drugs. Some items may interact with your medicine. What should I watch for while using this medicine? If you are taking this medicine on a regular schedule, visit your doctor or health care professional for regular checks on your progress. You may get dizzy, drowsy or have blurred vision. Do not drive, use machinery, or do anything that needs mental alertness until you know how this medicine affects you. Do not stand or sit up quickly, especially if you are an older patient. This reduces the risk of dizzy or fainting spells. Alcohol can increase possible dizziness. Avoid alcoholic drinks. Your mouth may get dry. Chewing sugarless gum or sucking hard candy, and drinking plenty of water may help. Contact your doctor if the problem does not go away or is severe. This medicine may cause dry eyes and blurred vision. If you wear contact lenses you may feel some discomfort. Lubricating drops may help. See your eye doctor if the problem does not go away or is severe. What side effects may I notice from receiving this medicine? Side effects that you should report  to your doctor or health care professional as soon as possible: -fainting spells -fast or irregular heartbeat Side effects that usually do not require medical attention (report to your doctor or health care professional if they continue or are bothersome): -constipation -difficulty passing urine -difficulty sleeping -headache -stomach upset This list may not describe all possible side effects. Call your doctor for medical advice about side effects. You may report side effects to FDA at 1-800-FDA-1088. Where should I keep my medicine? Keep out of the reach of children. Store at room temperature between 15 and 30 degrees C (59 and 86 degrees F). Keep container tightly closed. Throw away any unused medicine after the expiration date. NOTE: This sheet is a summary. It may not cover all possible  information. If you have questions about this medicine, talk to your doctor, pharmacist, or health care provider.  2015, Elsevier/Gold Standard. (2007-09-01 10:35:36) Vancomycin oral solution What is this medicine? VANCOMYCIN Lucianne Lei koe MYE sin) is a glycopeptide antibiotic. It is used to treat certain kinds of bacterial infections in the bowel. It will not work for colds, flu, or other viral infections. This medicine may be used for other purposes; ask your health care provider or pharmacist if you have questions. COMMON BRAND NAME(S): Vancocin What should I tell my health care provider before I take this medicine? They need to know if you have any of these conditions: -dehydration -inflammatory bowel disease -kidney disease -other chronic illness -an unusual or allergic reaction to vancomycin, other medicines, foods, dyes, or preservatives -pregnant or trying to get pregnant -breast-feeding How should I use this medicine? Take this medicine by mouth. Follow the directions on the prescription label. Shake well before using. Use a specially marked spoon or dropper to measure each dose. Ask your pharmacist if you do not have one. Household spoons are not accurate. Take your medicine at regular intervals. Do not take your medicine more often than directed. Take all of your medicine as directed even if you think you are better. Do not skip doses or stop your medicine early. Talk to your pediatrician regarding the use of this medicine in children. Special care may be needed. Overdosage: If you think you have taken too much of this medicine contact a poison control center or emergency room at once. NOTE: This medicine is only for you. Do not share this medicine with others. What if I miss a dose? If you miss a dose, take it as soon as you can. If it is almost time for your next dose, take only that dose. Do not take double or extra doses. What may interact with this medicine? -birth control  pills -cholestyramine -colestipol -vancomycin injection This list may not describe all possible interactions. Give your health care provider a list of all the medicines, herbs, non-prescription drugs, or dietary supplements you use. Also tell them if you smoke, drink alcohol, or use illegal drugs. Some items may interact with your medicine. What should I watch for while using this medicine? Tell your doctor or health care professional if your symptoms do not improve or if you get new symptoms. Avoid taking this medicine within 3 or 4 hours of taking cholestyramine or colestipol. What side effects may I notice from receiving this medicine? Side effects that you should report to your doctor or health care professional as soon as possible: -allergic reactions like skin rash, itching or hives, swelling of the face, lips, or tongue -breathing difficulty -change in amount, color of urine -change in hearing -  dizziness -fever, infection -redness, blistering, peeling or loosening of the skin, including inside the mouth -unusual bleeding or bruising -unusually weak or tired Side effects that usually do not require medical attention (report to your doctor or health care professional if they continue or are bothersome): -nausea, vomiting -stomach cramps This list may not describe all possible side effects. Call your doctor for medical advice about side effects. You may report side effects to FDA at 1-800-FDA-1088. Where should I keep my medicine? Keep out of the reach of children. After this medicine is mixed by your pharmacist, store it in a refrigerator between 2 and 8 degrees C (36 and 46 degrees F). Do not freeze. Throw away any unused medicine after 14 days. NOTE: This sheet is a summary. It may not cover all possible information. If you have questions about this medicine, talk to your doctor, pharmacist, or health care provider.  2015, [employer]/Gold Standard. (2012-09-30 14:46:53) Glipizide  tablets What is this medicine? GLIPIZIDE (GLIP i zide) helps to treat type 2 diabetes. Treatment is combined with diet and exercise. The medicine helps your body to use insulin better. This medicine may be used for other purposes; ask your health care provider or pharmacist if you have questions. COMMON BRAND NAME(S): Glucotrol What should I tell my health care provider before I take this medicine? They need to know if you have any of these conditions: -diabetic ketoacidosis -glucose-6-phosphate dehydrogenase deficiency -heart disease -kidney disease -liver disease -porphyria -severe infection or injury -thyroid disease -an unusual or allergic reaction to glipizide, sulfa drugs, other medicines, foods, dyes, or preservatives -pregnant or trying to get pregnant -breast-feeding How should I use this medicine? Take this medicine by mouth. Swallow with a drink of water. Do not take with food. Take it 30 minutes before a meal. Follow the directions on the prescription label. If you take this medicine once a day, take it 30 minutes before breakfast. Take your doses at the same time each day. Do not take more often than directed. Talk to your pediatrician regarding the use of this medicine in children. Special care may be needed. Elderly patients over 44 years old may have a stronger reaction and need a smaller dose. Overdosage: If you think you have taken too much of this medicine contact a poison control center or emergency room at once. NOTE: This medicine is only for you. Do not share this medicine with others. What if I miss a dose? If you miss a dose, take it as soon as you can. If it is almost time for your next dose, take only that dose. Do not take double or extra doses. What may interact with this medicine? -bosentan -chloramphenicol -cisapride -clarithromycin -medicines for fungal or yeast infections -metoclopramide -probenecid -warfarin Many medications may cause an increase  or decrease in blood sugar, these include: -alcohol containing beverages -aspirin and aspirin-like drugs -chloramphenicol -chromium -diuretics -female hormones, like estrogens or progestins and birth control pills -heart medicines -isoniazid -female hormones or anabolic steroids -medicines for weight loss -medicines for allergies, asthma, cold, or cough -medicines for mental problems -medicines called MAO Inhibitors like Nardil, Parnate, Marplan, Eldepryl -niacin -NSAIDs, medicines for pain and inflammation, like ibuprofen or naproxen -pentamidine -phenytoin -probenecid -quinolone antibiotics like ciprofloxacin, levofloxacin, ofloxacin -some herbal dietary supplements -steroid medicines like prednisone or cortisone -thyroid medicine This list may not describe all possible interactions. Give your health care provider a list of all the medicines, herbs, non-prescription drugs, or dietary supplements you use. Also tell them  if you smoke, drink alcohol, or use illegal drugs. Some items may interact with your medicine. What should I watch for while using this medicine? Visit your doctor or health care professional for regular checks on your progress. A test called the HbA1C (A1C) will be monitored. This is a simple blood test. It measures your blood sugar control over the last 2 to 3 months. You will receive this test every 3 to 6 months. Learn how to check your blood sugar. Learn the symptoms of low and high blood sugar and how to manage them. Always carry a quick-source of sugar with you in case you have symptoms of low blood sugar. Examples include hard sugar candy or glucose tablets. Make sure others know that you can choke if you eat or drink when you develop serious symptoms of low blood sugar, such as seizures or unconsciousness. They must get medical help at once. Tell your doctor or health care professional if you have high blood sugar. You might need to change the dose of your  medicine. If you are sick or exercising more than usual, you might need to change the dose of your medicine. Do not skip meals. Ask your doctor or health care professional if you should avoid alcohol. Many nonprescription cough and cold products contain sugar or alcohol. These can affect blood sugar. This medicine can make you more sensitive to the sun. Keep out of the sun. If you cannot avoid being in the sun, wear protective clothing and use sunscreen. Do not use sun lamps or tanning beds/booths. Wear a medical ID bracelet or chain, and carry a card that describes your disease and details of your medicine and dosage times. What side effects may I notice from receiving this medicine? Side effects that you should report to your doctor or health care professional as soon as possible: -allergic reactions like skin rash, itching or hives, swelling of the face, lips, or tongue -breathing problems -dark urine -fever, chills, sore throat -signs and symptoms of low blood sugar such as feeling anxious, confusion, dizziness, increased hunger, unusually weak or tired, sweating, shakiness, cold, irritable, headache, blurred vision, fast heartbeat, loss of consciousness -unusual bleeding or bruising -yellowing of the eyes or skin Side effects that usually do not require medical attention (report to your doctor or health care professional if they continue or are bothersome): -diarrhea -dizziness -headache -heartburn -nausea -stomach gas This list may not describe all possible side effects. Call your doctor for medical advice about side effects. You may report side effects to FDA at 1-800-FDA-1088. Where should I keep my medicine? Keep out of the reach of children. Store at room temperature below 30 degrees C (86 degrees F). Throw away any unused medicine after the expiration date. NOTE: This sheet is a summary. It may not cover all possible information. If you have questions about this medicine, talk to  your doctor, pharmacist, or health care provider.  2015, [employer] Standard. (2012-06-08 14:42:46)

## 2014-04-30 NOTE — Progress Notes (Signed)
UR completed 

## 2014-04-30 NOTE — Discharge Summary (Signed)
Name: [name] MRN: [id number] DOB: May 24, 1950 64 y.o. PCP: Cresenciano Genre, MD  Date of Admission: 04/26/2014  5:56 PM Date of Discharge: 04/30/2014 Attending Physician: Karren Cobble, MD  Discharge Diagnosis: Principal Problem:   Clostridium difficile infection Active Problems:   Hypothyroidism   Diabetes type 2, controlled   Depression   Essential hypertension   Atrial fibrillation   GERD   BPPV (benign paroxysmal positional vertigo)   Vitamin D deficiency  Discharge Medications:   Medication List    STOP taking these medications        diphenoxylate-atropine 2.5-0.025 MG per tablet  Commonly known as:  LOMOTIL     furosemide 20 MG tablet  Commonly known as:  LASIX     omeprazole 20 MG capsule  Commonly known as:  PRILOSEC     potassium chloride 10 MEQ tablet  Commonly known as:  K-DUR      TAKE these medications        ACCU-CHEK SMARTVIEW CONTROL Liqd  1 Device by In Vitro route as needed. Use to perform control/ calibration on accu-chek meter. diag code E11.9. Non insulin dependent     atenolol 25 MG tablet  Commonly known as:  TENORMIN  Take 1.5 tablets (37.5 mg total) by mouth 2 (two) times daily.     diltiazem 180 MG 24 hr capsule  Commonly known as:  CARDIZEM CD  Take 1 capsule (180 mg total) by mouth daily.     famotidine 20 MG tablet  Commonly known as:  PEPCID  Take 1 tablet (20 mg total) by mouth daily.     feeding supplement (ENSURE COMPLETE) Liqd  Take 237 mLs by mouth daily.     FLUoxetine 20 MG capsule  Commonly known as:  PROZAC  Take 1 capsule (20 mg total) by mouth daily.     glipiZIDE 5 MG tablet  Commonly known as:  GLUCOTROL  Take 0.5 tablets (2.5 mg total) by mouth daily before breakfast.     Lancets 30G Misc  - DX Code: E11.9  - Check blood sugars one time per day.  - Accucheck fastclix lancets     levothyroxine 50 MCG tablet  Commonly known as:  SYNTHROID, LEVOTHROID  Take 1 tablet (50 mcg total) by mouth  every morning.     losartan 25 MG tablet  Commonly known as:  COZAAR  Take 0.5 tablets (12.5 mg total) by mouth daily.     meclizine 25 MG tablet  Commonly known as:  ANTIVERT  Take 1 tablet (25 mg total) by mouth 3 (three) times daily as needed for dizziness.     pravastatin 40 MG tablet  Commonly known as:  PRAVACHOL  Take 1 tablet (40 mg total) by mouth daily.     tiZANidine 4 MG tablet  Commonly known as:  ZANAFLEX  Take 1 tablet (4 mg total) by mouth every 12 (twelve) hours as needed for muscle spasms.     TRUNATURE DIGESTIVE PROBIOTIC PO  Take 1 capsule by mouth daily.     vancomycin 50 mg/mL oral solution  Commonly known as:  VANCOCIN  Take 2.5 mLs (125 mg total) by mouth 4 (four) times daily.     Vitamin D (Ergocalciferol) 50000 UNITS Caps capsule  Commonly known as:  DRISDOL  Take 1 capsule (50,000 Units total) by mouth every 7 (seven) days.     warfarin 2.5 MG tablet  Commonly known as:  COUMADIN  Take 1-2 tablets (2.5-5 mg total)  by mouth at bedtime. Take 5 mg MWF and 2.5 mg SSTT.        Disposition and follow-up:   [name] Media was discharged from [facility] in Stable condition.  At the hospital follow up visit please address:  1.  Resolution of diarrhea and completion of oral vancomycin, resolution of vertigo, blood sugar control, need for Lasix.  2.  Labs / imaging needed at time of follow-up: INR, Vitamin D 25 level in 4-6 weeks.  3.  Pending labs/ test needing follow-up: HIV antibody, folate, ova and parasites, norovirus.  Follow-up Appointments: Follow-up Information    Follow up with Cresenciano Genre, MD. Go on 05/07/2014.   Specialty:  Internal Medicine   Why:  9:15 AM appointment. Follow-up on recent hospital admission.   Contact information:   [place] [id number] [phone]       Discharge Instructions:  Thank you for allowing [place] to be involved in your healthcare while you were hospitalized at [facility].   Please note that there have been changes to your home medications.  --> PLEASE LOOK AT YOUR DISCHARGE MEDICATION LIST FOR DETAILS.   Please call your PCP if you have any questions or concerns, or any difficulty getting any of your medications.  Please return to the ER if you have worsening of your symptoms or new severe symptoms arise.  It is most important that you continue to take your oral vancomycin for 11 more days. You should finish her medication on 05/11/2014.  Stop taking metformin and omeprazole because of your diarrhea.  We have started you on glipizide for your diabetes instead of metformin.  We have switched you from omeprazole to famotidine for your reflux.  You can take meclizine for your dizziness.  This should continue to improve.  It is from Benign Positional Vertigo.  For your Coumadin, take 5 mg on Monday, Wednesday, and Friday. Take 2.5 mg on Saturday, Sunday, Tuesday, and Thursday. Make sure to follow-up with Dr. Elie Confer at your clinic appointment. Discharge Instructions    Call MD for:  difficulty breathing, headache or visual disturbances    Complete by:  As directed      Call MD for:  extreme fatigue    Complete by:  As directed      Call MD for:  persistant dizziness or light-headedness    Complete by:  As directed      Call MD for:  persistant nausea and vomiting    Complete by:  As directed      Call MD for:  redness, tenderness, or signs of infection (pain, swelling, redness, odor or green/yellow discharge around incision site)    Complete by:  As directed      Call MD for:  severe uncontrolled pain    Complete by:  As directed      Call MD for:  temperature >100.4    Complete by:  As directed      Diet - low sodium heart healthy    Complete by:  As directed      Increase activity slowly    Complete by:  As directed            Consultations: None.  Procedures Performed:  Ct Abdomen Pelvis W Contrast  04/08/2014    CLINICAL DATA:  Low abdominal to pelvic pain for a couple of days with some nausea Hx of Diverticulosis Hx of hypothyroidism  Hx of skin ca  EXAM: CT ABDOMEN AND PELVIS WITH CONTRAST  TECHNIQUE: Multidetector CT imaging of the abdomen and pelvis was performed using the standard protocol following bolus administration of intravenous contrast.  CONTRAST:  144mL OMNIPAQUE IOHEXOL 300 MG/ML  SOLN  COMPARISON:  08/18/2013  FINDINGS: There are inflammatory changes surrounding thick-walled segment of the mid sigmoid colon, extending for approximately 7 cm. There are several diverticula in this location, 1 in particular which appears inflamed. Findings are most consistent with diverticulitis. There is no extraluminal air. No fluid collection is seen to suggest an abscess.  There other scattered diverticula along the left colon with no other evidence diverticulitis. Remainder of the colon is unremarkable. Normal small bowel. Normal appendix.  4 mm nodule at the base of the oblique fissure on the right, stable from the prior study. Lung bases otherwise essentially clear.  Mild diffuse fatty infiltration of the liver. No liver mass or focal lesion.  Normal spleen. The gallbladder surgically absent. No bile duct dilation.  Normal pancreas and adrenal glands.  4 mm low-density lesion in the posterior lower pole of the right kidney, likely a cyst. No other renal abnormalities. No hydronephrosis. Normal ureters. Normal bladder.  Uterus surgically absent.  Few prominent lymph nodes along the gastrohepatic ligament, stable and presumed reactive. No other prominent lymph nodes.  No ascites.  No free air.  There are degenerative changes at L4-L5 with a grade 1 anterolisthesis. No osteoblastic or osteolytic lesions.  IMPRESSION: 1. Uncomplicated sigmoid colon diverticulitis. No extraluminal air. No abscess. 2. No other acute findings.   Electronically Signed   By: Lajean Manes M.D.   On: 04/08/2014 13:13   Admission HPI:  64 yo  female with DM II, HTN, OA, Afib on coumadin, CHF, hypothyroidism, stroke, presenting from clinic with chronic diarrhea and lightheadedness.  Diarrhea has been going on for 5-6 months, but worsened in the past week. Usually has 10-12 episodes of diarrhea, usually every time she eats. Last week has been all watery. Also had n/v nb/nb 2 days ago but now has resolved. having dark stools, no nose bleeds or hematemesis. No improvement with lomotil. Tried to self-hydrated with Gatorade and water but continued to feel dizzy and unsteady. Has vertigo as well for 1 week. Worse with any movement, not only upon standing. No hearing changes, but hears weird sound in ear per patient, not sure if ringing.   Timeline:  Went to ED on 04/08/14 with severe lower ab pain, CT sigmoid diverticulitis, d/c with cipro BID x10 days and flagyl BID x 7 days. Completed. Last cscope august 2010 normal for rectal bleeding, was just mod diverticulosis.  Seen GI 04/16/14 - ddx per GI included IBS, metformin related, or SIBO. Asked to f/up, no interventions done. PCP 04/19/14: negative workup (lactoferin, stool fat, stool cx), cdiff neg.  ED 04/20/14 - hydrated and sent home with lomotil.  Denies any fever/chills/n/v currently. Had last bm in the morning today 2-3 times. Has been on lasix for "fluid in lungs" but recently did not have any leg edema or other signs of volume overload. Last ECHO was normal.   Hospital Course by problem list: Principal Problem:   Clostridium difficile infection Active Problems:   Hypothyroidism   Diabetes type 2, controlled   Depression   Essential hypertension   Atrial fibrillation   GERD   BPPV (benign paroxysmal positional vertigo)   Vitamin D deficiency   #C. difficile diarrhea Ms. [name] Media presented with several months of chronic diarrhea with acute worsening over  the last week. She had reported an increased dose of metformin in correlation with her chronic diarrhea. In addition, she had  completed a course of Cipro and Flagyl for sigmoid diverticulitis at the beginning of February. She had been previously worked up in clinic with a negative C. difficile PCR on 04/19/2014. On admission, her home metformin and omeprazole were held, and her diarrhea resolved the following day. C. difficile PCR came back positive, and she was started on oral vancomycin despite having mild C. difficile given her chronic Coumadin use and concern for a drug interaction with Flagyl. She was discharged home on oral vancomycin for 11 more days ending on 05/11/2014. It was thought that her chronic diarrhea was most likely due to metformin, so this was discontinued at discharge.  #Benign paroxysmal positional vertigo She complained of vertigo that was most pronounced with abrupt movements including movement of her head to the left. She was not orthostatic on presentation, and she was noted to have a left beating nystagmus. Dix-Hallpike maneuver was positive, and she was diagnosed with benign paroxysmal positional vertigo. Physical therapy was consulted and perform the Epley maneuver. She had steady improvement of her vertigo prior to discharge. She was given meclizine 3 times a day for her residual vertigo with good relief. Physical therapy recommended home health PT for vestibular rehabilitation and a rolling walker which was ordered at discharge.  #Type 2 diabetes Her blood sugars have been well controlled on her home metformin regimen with a hemoglobin A1c of 6.2 on 04/19/2014. However, due to her chronic diarrhea which was thought to be due to metformin, she was switched to glipizide 2.5 mg daily. This was initiated prior to discharge with good blood sugar control.  #Vitamin D deficiency Her vitamin D 25 was noted to be 16.4, so she was started on ergocalciferol 50,000 units per week for 4-6 weeks. Please recheck her vitamin D 25 level at that time and switch her to maintenance ergocalciferol.  #GERD Her home  omeprazole was stopped due to her C. difficile and diarrhea. She developed some reflux symptoms, so she was started on famotidine.  This was continued at discharge.  #Atrial fibrillation Her rate was well controlled on her home diltiazem. Her INR was subtherapeutic on admission, so her Coumadin dosing was adjusted by pharmacy. She was discharged on Coumadin 5 mg on MWF and 2.5 mg on TTSS, and she was told to follow-up with Dr. Elie Confer.  #Hypertension Her home blood pressure medications were initially held. She was restarted on her home Cozaar and atenolol once her diarrhea resolved. Her home Lasix and K Dur were held at discharge because she does not have a history of heart failure, and she had no evidence of lower extremity swelling.  Discharge Vitals:   BP 121/75 mmHg  Pulse 74  Temp(Src) 97.8 F (36.6 C) (Oral)  Resp 18  Ht 5\' 3"  (1.6 m)  Wt 164 lb (74.39 kg)  BMI 29.06 kg/m2  SpO2 100%  LMP 06/03/1978  Discharge Labs:  Results for orders placed or performed during the hospital encounter of 04/26/14 (from the past 24 hour(s))  Glucose, capillary     Status: Abnormal   Collection Time: 04/29/14  4:55 PM  Result Value Ref Range   Glucose-Capillary 180 (H) 70 - 99 mg/dL  Glucose, capillary     Status: None   Collection Time: 04/29/14 10:02 PM  Result Value Ref Range   Glucose-Capillary 95 70 - 99 mg/dL   Comment 1 Notify RN  Comment 2 Documented in Char   Protime-INR     Status: Abnormal   Collection Time: 04/30/14  5:23 AM  Result Value Ref Range   Prothrombin Time 24.6 (H) 11.6 - 15.2 seconds   INR 2.19 (H) 0.00 - 1.49  Glucose, capillary     Status: None   Collection Time: 04/30/14  8:19 AM  Result Value Ref Range   Glucose-Capillary 86 70 - 99 mg/dL  Glucose, capillary     Status: Abnormal   Collection Time: 04/30/14 12:12 PM  Result Value Ref Range   Glucose-Capillary 132 (H) 70 - 99 mg/dL    Signed: Arman Filter, MD 04/30/2014, 3:25 PM    Services Ordered on  Discharge: Home health PT. Equipment Ordered on Discharge: Rolling walker.

## 2014-05-03 ENCOUNTER — Telehealth: Payer: Self-pay | Admitting: *Deleted

## 2014-05-03 LAB — NOROVIRUS GROUP 1 & 2 BY PCR, STOOL
Norovirus 1 by PCR: NOT DETECTED
Norovirus 2  by PCR: NOT DETECTED
Norovirus RNA, RT, PCR: NOT DETECTED

## 2014-05-03 NOTE — Telephone Encounter (Signed)
PT, [facility] calls for verbal approval for csw to assist w/ community resources, he states pt is having a tough time at the moment, verbal approval was given, do you approve?

## 2014-05-07 ENCOUNTER — Ambulatory Visit (INDEPENDENT_AMBULATORY_CARE_PROVIDER_SITE_OTHER): Payer: Commercial Managed Care - HMO | Admitting: Internal Medicine

## 2014-05-07 ENCOUNTER — Ambulatory Visit (INDEPENDENT_AMBULATORY_CARE_PROVIDER_SITE_OTHER): Payer: Commercial Managed Care - HMO | Admitting: Pharmacist

## 2014-05-07 VITALS — BP 139/81 | HR 77 | Temp 98.1°F | Wt 184.0 lb

## 2014-05-07 DIAGNOSIS — H8110 Benign paroxysmal vertigo, unspecified ear: Secondary | ICD-10-CM

## 2014-05-07 DIAGNOSIS — E119 Type 2 diabetes mellitus without complications: Secondary | ICD-10-CM

## 2014-05-07 DIAGNOSIS — Z5181 Encounter for therapeutic drug level monitoring: Secondary | ICD-10-CM

## 2014-05-07 DIAGNOSIS — I4891 Unspecified atrial fibrillation: Secondary | ICD-10-CM

## 2014-05-07 DIAGNOSIS — K219 Gastro-esophageal reflux disease without esophagitis: Secondary | ICD-10-CM

## 2014-05-07 DIAGNOSIS — I482 Chronic atrial fibrillation, unspecified: Secondary | ICD-10-CM

## 2014-05-07 DIAGNOSIS — A498 Other bacterial infections of unspecified site: Secondary | ICD-10-CM

## 2014-05-07 DIAGNOSIS — E559 Vitamin D deficiency, unspecified: Secondary | ICD-10-CM

## 2014-05-07 DIAGNOSIS — I10 Essential (primary) hypertension: Secondary | ICD-10-CM

## 2014-05-07 DIAGNOSIS — B9689 Other specified bacterial agents as the cause of diseases classified elsewhere: Secondary | ICD-10-CM

## 2014-05-07 LAB — POCT INR: INR: 1.4

## 2014-05-07 MED ORDER — GLIPIZIDE 5 MG PO TABS: 2.5000 mg | ORAL_TABLET | Freq: Every day | ORAL | Status: DC

## 2014-05-07 MED ORDER — FAMOTIDINE 20 MG PO TABS: 20.0000 mg | ORAL_TABLET | Freq: Every day | ORAL | Status: DC

## 2014-05-07 NOTE — Assessment & Plan Note (Signed)
Her diarrhea has resolved.  She continues to take vancomycin PO 4 times daily (finish date 05/11/14).  She is taking probiotics.

## 2014-05-07 NOTE — Assessment & Plan Note (Addendum)
Lab Results  Component Value Date   HGBA1C 6.2 04/19/2014   HGBA1C 6.6 12/21/2013   HGBA1C 8.2 09/07/2013     Assessment: Diabetes control:  controlled Progress toward A1C goal:   at goal Comments: has new CBG meter but reports old meter had blood sugars of 120-140 before meals. Will stop metformin permanently given her hx of diarrhea with this med. On glipizide 2.5mg  BIC ac with no hypoglycemia symptoms.   Plan: Medications:  continue current medications Home glucose monitoring: Frequency:   Timing:   Instruction/counseling given: reminded to bring blood glucose meter & log to each visit Educational resources provided:   Self management tools provided:   Other plans: Follow up on 3/17 with her PCP. Sent refill of glipizide to [facility].

## 2014-05-07 NOTE — Assessment & Plan Note (Signed)
INR of 1.4 today. She has met with Dr. Elie Confer with instructions to increase her warfarin. She will return on 3/2 for INR recheck.

## 2014-05-07 NOTE — Progress Notes (Signed)
   Subjective:    Patient ID: [name], female    DOB: 1950/06/02, 64 y.o.   MRN: [id number]  HPI [name] is a 64 yr old woman with PMH of DM2, HTN, OA, HLP, A. Fib on coumadin, BPPV, and recently diagnosed C. Diff who presents for HFU. She was hospitalized early this month and treated for C. Diff diarrhea. She was discharged on 2/22 and continues to take her vancomycin PO (end date of 05/11/14) with complete resolution of her diarrhea. She notes that she was also taken off the metformin and wonders if this medication was causing her ongoing diarrhea for the past few months.  Of note, she had labs pending on discharge: HIV is negative, stool ova and parasite negative, norovirus also negative, folate wnl.  Vitamin D: Her vitamin D was low at 16. She was started on ergocalciferol 50,000 units per week for 4-6 weeks. She will need recheck of her level in 5 weeks.  Coumadin tx: She has met with Dr. Elie Confer prior to this visit and has therapeutic INR of 1.4. She was given warfarin dosing instructions by Dr. Elie Confer. BPPV: Her dizziness has improved with HH PT for vestibular rehab. She has not needed to take meclizine.  DM2: Her blood sugars have been well controlled with glipizide 2.$RemoveBeforeDE'5mg'fETqvnTrevuqDeI$  BID ac--metformin has been permanently stopped.    Review of Systems  Constitutional: Negative for fever, chills, diaphoresis, activity change, appetite change, fatigue and unexpected weight change.  HENT: Negative for congestion.   Respiratory: Negative for cough, shortness of breath and wheezing.   Cardiovascular: Negative for chest pain, palpitations and leg swelling.  Gastrointestinal: Negative for abdominal pain.  Genitourinary: Negative for dysuria and difficulty urinating.  Musculoskeletal: Negative for back pain.  Skin: Negative for rash.  Neurological: Negative for dizziness and light-headedness.  Psychiatric/Behavioral: Negative for agitation.       Objective:   Physical Exam  Constitutional: She is  oriented to person, place, and time. She appears well-developed and well-nourished. No distress.  Cardiovascular: Normal rate.   Pulmonary/Chest: Effort normal. No respiratory distress. She has no wheezes. She has no rales.  Abdominal: Soft. Bowel sounds are normal. She exhibits no distension. There is no tenderness. There is no rebound.  Musculoskeletal: She exhibits no edema or tenderness.  Neurological: She is alert and oriented to person, place, and time. Coordination normal.  Skin: Skin is warm and dry. No rash noted. She is not diaphoretic.  Psychiatric: She has a normal mood and affect.  Nursing note and vitals reviewed.         Assessment & Plan:

## 2014-05-07 NOTE — Assessment & Plan Note (Signed)
BP Readings from Last 3 Encounters:  05/07/14 139/81  04/30/14 132/77  04/26/14 136/70    Lab Results  Component Value Date   NA 141 04/27/2014   K 3.8 04/27/2014   CREATININE 0.75 04/27/2014    Assessment: Blood pressure control:  controlled Progress toward BP goal:   at goal Comments: She is on atenolol 37.5mg  BID, diltiazem 180mg  24 hr tab daily, and losartan 12.5mg  daily  Plan: Medications:  continue current medications Educational resources provided:   Self management tools provided:   Other plans: Follow up on 3/17.

## 2014-05-07 NOTE — Patient Instructions (Signed)
General Instructions: -It was great seeing you today! -Continue taking the vancomycin antibiotic until you finish it all.  -Follow up with Dr. Elie Confer on Wednesday.  -Follow up with Dr. Aundra Dubin on 3/17.   Thank you for bringing your medicines today. This helps [place] keep you safe from mistakes.   Progress Toward Treatment Goals:  Treatment Goal 04/19/2014  Hemoglobin A1C at goal  Blood pressure at goal  Prevent falls -    Self Care Goals & Plans:  Self Care Goal 04/26/2014  Manage my medications take my medicines as prescribed; bring my medications to every visit; refill my medications on time; follow the sick day instructions if I am sick  Monitor my health keep track of my weight; check my feet daily  Eat healthy foods eat more vegetables; eat fruit for snacks and desserts; eat baked foods instead of fried foods; eat smaller portions; eat foods that are low in salt; drink diet soda or water instead of juice or soda  Be physically active find an activity I enjoy  Prevent falls -  Meeting treatment goals -    Home Blood Glucose Monitoring 04/19/2014  Check my blood sugar no home glucose monitoring  When to check my blood sugar N/A     Care Management & Community Referrals:  Referral 04/19/2014  Referrals made for care management support none needed  Referrals made to community resources none

## 2014-05-07 NOTE — Assessment & Plan Note (Addendum)
Stopped omeprazole, continue famotidine, sent refill  to [facility].

## 2014-05-07 NOTE — Assessment & Plan Note (Signed)
Much improved with HH PT for vestibular Rehab.  -Continue HH PT vest rehab -Continue meclizine PRN for BPPV

## 2014-05-07 NOTE — Assessment & Plan Note (Signed)
Level of 16 while in the hospital, started on Vitamin D 50K once weekly, will need level repeated in 4 weeks.

## 2014-05-08 ENCOUNTER — Encounter: Payer: Self-pay | Admitting: Internal Medicine

## 2014-05-08 MED ORDER — FAMOTIDINE 20 MG PO TABS: 20.0000 mg | ORAL_TABLET | Freq: Every day | ORAL | Status: DC

## 2014-05-08 MED ORDER — GLIPIZIDE 5 MG PO TABS: 2.5000 mg | ORAL_TABLET | Freq: Every day | ORAL | Status: DC

## 2014-05-09 ENCOUNTER — Encounter: Payer: Self-pay | Admitting: *Deleted

## 2014-05-09 NOTE — Patient Instructions (Signed)
Patient instructed to take medications as defined in the Anti-coagulation Track section of this encounter.  Patient instructed to take today's dose.  Patient verbalized understanding of these instructions.    

## 2014-05-09 NOTE — Progress Notes (Signed)
Anti-Coagulation Progress Note  [name] is a 64 y.o. female who is currently on an anti-coagulation regimen.    RECENT RESULTS: Recent results are below, the most recent result is correlated with a dose of 2.5mg  (as 1/2 x 5mg  warfarin) PO qd since discharge. She was supposed to be taking a higher daily dose but by her acknowledgement became confused and continued to take 2.5mg  since discharge I suspect accounting for today's INR.  Lab Results  Component Value Date   INR 1.4 05/07/2014   INR 2.19* 04/30/2014   INR 2.01* 04/29/2014   PROTIME 18.8 08/20/2008    ANTI-COAG DOSE: Anticoagulation Dose Instructions as of 05/07/2014      [name] Tue Wed Thu Fri Sat   New Dose 5 mg 5 mg 5 mg 5 mg 2.5 mg 5 mg 2.5 mg       ANTICOAG SUMMARY: Anticoagulation Episode Summary    Current INR goal 2.0-3.0  Next INR check 05/14/2014  INR from last check 1.4! (05/07/2014)  Weekly max dose   Target end date   [facility]  Preferred lab   Send INR reminders to    Indications  TIA [G45.9] Atrial fibrillation [I48.91] Encounter for therapeutic drug monitoring [Z51.81]        Comments Needs Lovenox Bridge      Anticoagulation Care Providers    Provider Role Specialty Phone number   Cresenciano Genre, MD Responsible Internal Medicine [phone]      ANTICOAG TODAY: Anticoagulation Summary as of 05/07/2014    INR goal 2.0-3.0  Selected INR 1.4! (05/07/2014)  Next INR check 05/14/2014  Target end date    Indications  TIA [G45.9] Atrial fibrillation [I48.91] Encounter for therapeutic drug monitoring [Z51.81]      Anticoagulation Episode Summary    [facility]   Preferred lab    Send INR reminders to    Comments Needs Lovenox Bridge    Anticoagulation Care Providers    Provider Role Specialty Phone number   Cresenciano Genre, MD Responsible Internal Medicine [phone]      PATIENT INSTRUCTIONS: Patient Instructions  Patient  instructed to take medications as defined in the Anti-coagulation Track section of this encounter.  Patient instructed to take today's dose.  Patient verbalized understanding of these instructions.       FOLLOW-UP Return in 7 days (on 05/14/2014) for Follow up INR at 0930h.  Jorene Guest, III Pharm.D., CACP

## 2014-05-14 ENCOUNTER — Ambulatory Visit (INDEPENDENT_AMBULATORY_CARE_PROVIDER_SITE_OTHER): Payer: Commercial Managed Care - HMO | Admitting: Pharmacist

## 2014-05-14 DIAGNOSIS — I4891 Unspecified atrial fibrillation: Secondary | ICD-10-CM

## 2014-05-14 DIAGNOSIS — Z5181 Encounter for therapeutic drug level monitoring: Secondary | ICD-10-CM

## 2014-05-14 DIAGNOSIS — Z7901 Long term (current) use of anticoagulants: Secondary | ICD-10-CM

## 2014-05-14 LAB — POCT INR: INR: 2.5

## 2014-05-14 MED ORDER — RIVAROXABAN 20 MG PO TABS: 20.0000 mg | ORAL_TABLET | Freq: Every day | ORAL | Status: DC

## 2014-05-14 NOTE — Progress Notes (Signed)
INTERNAL MEDICINE TEACHING ATTENDING ADDENDUM - Avis Mcmahill M.D  Duration- indefinite, Indication- afib, INR- sub therapeutic. Agree with Dr. Groce's recommendations as outlined in his note.      

## 2014-05-14 NOTE — Progress Notes (Signed)
Anti-Coagulation Progress Note  [name] is a 64 y.o. female who is currently on an anti-coagulation regimen.    RECENT RESULTS: Recent results are below, the most recent result is correlated with a dose of 30 mg. per week:  A discussion of full disclosure and fair balance was held with the patient regarding alternative anticoagulation to warfarin citing her time in target range (low), her SAMe-TT2R2 score (> 2) suggesting she may not do as well on warfarin factoring in all components of this validated score and published in the medical literature, as well as her requirement for frequent INR monitoring requiring her to come from her home in [place] to our anticoagulation management clinic. Her cardiologist was consulted for his opinion or switching from warfarin to a direct oral anticoagulant (rivaroxaban/Xarelto) and he was accepting of this alternative--the patient was given a choice between once-daily dosing or twice-daily dosing and preferred once-daily dosing--hence the decision for rivaroxaban/Xarelto. We explored her costs for this medication and after her outpatient pharmacy "ran" the prescription it revealed her monthly co pay would be $3.60.  The patient was advised of the following:  Her requirement to be compliant to the medication--as it has fast onset, fast offset. She was advised for maximum absorption she should take the medication ONCE DAILY with EVENING MEAL (largest meal of the day). She was advised of the following:  Though there is no current FDA approved means of reversal of rivaroxaban/Xarelto, if she were to have major life-threatening bleeding that the healthcare system's management strategy of employing a 25F PPC non-activated with all other supportive care as necessary would be implemented. It was also discussed that the likelihood of having life-threatening bleeding as observed in the clinical trial(s) as well as post-marketing surveillance studies indicate the likelihood of  life-threatening bleeding is reduced with rivaroxaban/Xarelto vs warfarin/Coumadin therapy. She understands--no different than if she remained upon warfarin--that signs or symptoms of bleeding (blood in:  Urine, stool, nosebleeds, throwing up blood, coughing up blood, increased bruising, etc.) should be reported immediately to the clinic, her provider or she is instructed to go to the nearest emergency room if these signs or symptoms occur. She was reminded that she cannot permanently discontinue Xarelto without notifying her cardiologist or the outpatient clinic--as she would have to be recommenced upon warfarin in that event. She understands no INR monitoring is necessary with rivaroxaban/Xarelto. She understands that we may elect periodically (every 3-4 months) to evaluate her renal function to determine if a dose reduction would be necessary. We will contact her on follow up to make certain she is compliant, i.e. Has picked up her prescription, etc.  Lab Results  Component Value Date   INR 2.50 05/14/2014   INR 1.4 05/07/2014   INR 2.19* 04/30/2014   PROTIME 18.8 08/20/2008    ANTI-COAG DOSE: Anticoagulation Dose Instructions as of 05/14/2014      [name] Tue Wed Thu Fri Sat   New Dose 5 mg 5 mg 5 mg 5 mg 2.5 mg 5 mg 2.5 mg       ANTICOAG SUMMARY:   ANTICOAG TODAY: Anticoagulation Summary as of 05/14/2014    INR goal 2.0-3.0  Selected INR 2.50 (05/14/2014)  Next INR check   Target end date    Indications  TIA [G45.9] Atrial fibrillation [I48.91] Encounter for therapeutic drug monitoring [Z51.81]      Anticoagulation Episode Summary    [facility] check location [facility]   Preferred lab    Discontinue date 05/14/2014  Discontinue reason Alternate Therapy Initiated   Send INR reminders to    Comments Needs [facility]    Anticoagulation Care Providers    Provider Role Specialty Phone number   Cresenciano Genre, MD Responsible Internal Medicine [phone]      PATIENT  INSTRUCTIONS: Patient Instructions  Patient instructed to take medications as defined in the Anti-coagulation Track section of this encounter.  Patient instructed to OMIT today's dose.  Patient instructed to DISCONTINUE warfarin and to commence upon rivaroxaban/Xarelto 20mg  tablet to be taken by mouth ONCE DAILY with dinner evening meal.  Patient verbalized understanding of these instructions.       FOLLOW-UP Return if symptoms worsen or fail to improve.  Jorene Guest, III Pharm.D., CACP

## 2014-05-14 NOTE — Progress Notes (Signed)
INTERNAL MEDICINE TEACHING ATTENDING ADDENDUM - Damin Salido, MD: I reviewed and discussed at the time of visit with the resident Dr. Kennerly, the patient's medical history, physical examination, diagnosis and results of pertinent tests and treatment and I agree with the patient's care as documented.  

## 2014-05-14 NOTE — Patient Instructions (Signed)
Patient instructed to take medications as defined in the Anti-coagulation Track section of this encounter.  Patient instructed to OMIT today's dose.  Patient instructed to DISCONTINUE warfarin and to commence upon rivaroxaban/Xarelto 20mg  tablet to be taken by mouth ONCE DAILY with dinner evening meal.  Patient verbalized understanding of these instructions.

## 2014-05-14 NOTE — Addendum Note (Signed)
Addended by: Jorene Guest B on: 05/14/2014 05:19 PM   Modules accepted: Orders, Medications

## 2014-05-22 ENCOUNTER — Telehealth: Payer: Self-pay | Admitting: Licensed Clinical Social Worker

## 2014-05-22 NOTE — Telephone Encounter (Signed)
[name] was referred to CSW to explore options assistance programs for Xarelto.  [name] is insured with [employer].  Copayment for Xarelto is approx $46.  Xarelto PAP is available, will need to confirm pt's total gross income and if [name] has spent 3% of her income on her medication for this calendar year.  CSW will also inquire if [name] has applied for the ExtraHelp program.  CSW placed called to pt.  CSW left message requesting return call. CSW provided contact hours and phone number.

## 2014-05-23 NOTE — Telephone Encounter (Signed)
[name] returned call to [place].  Pt in agreement to complete the Xarelto PAP.  Most likely, [name] is over income for the ExtraHelp Program.  Currently, [name] is losing her home and is unsure how long she will be able to stay there.  Pt is supplementing her source of food with trips to the local pantry.  CSW mailed Xarelto PAP application, listing of senior apartments / apartments that utilize a sliding fee scale, and the [employer] program.  Pt to return completed and signed application to CSW along with financial information requested on application.  Self addressed, stamped envelope included.

## 2014-05-24 ENCOUNTER — Encounter: Payer: Self-pay | Admitting: Internal Medicine

## 2014-05-24 ENCOUNTER — Ambulatory Visit (INDEPENDENT_AMBULATORY_CARE_PROVIDER_SITE_OTHER): Payer: Commercial Managed Care - HMO | Admitting: Internal Medicine

## 2014-05-24 VITALS — BP 123/57 | HR 77 | Temp 98.2°F | Wt 182.2 lb

## 2014-05-24 DIAGNOSIS — A498 Other bacterial infections of unspecified site: Secondary | ICD-10-CM

## 2014-05-24 DIAGNOSIS — I482 Chronic atrial fibrillation, unspecified: Secondary | ICD-10-CM

## 2014-05-24 DIAGNOSIS — Z598 Other problems related to housing and economic circumstances: Secondary | ICD-10-CM

## 2014-05-24 DIAGNOSIS — M549 Dorsalgia, unspecified: Secondary | ICD-10-CM

## 2014-05-24 DIAGNOSIS — E119 Type 2 diabetes mellitus without complications: Secondary | ICD-10-CM

## 2014-05-24 DIAGNOSIS — I10 Essential (primary) hypertension: Secondary | ICD-10-CM

## 2014-05-24 DIAGNOSIS — E559 Vitamin D deficiency, unspecified: Secondary | ICD-10-CM

## 2014-05-24 DIAGNOSIS — A047 Enterocolitis due to Clostridium difficile: Secondary | ICD-10-CM

## 2014-05-24 DIAGNOSIS — G8929 Other chronic pain: Secondary | ICD-10-CM

## 2014-05-24 DIAGNOSIS — Z599 Problem related to housing and economic circumstances, unspecified: Secondary | ICD-10-CM

## 2014-05-24 DIAGNOSIS — Z0000 Encounter for general adult medical examination without abnormal findings: Secondary | ICD-10-CM

## 2014-05-24 DIAGNOSIS — H8110 Benign paroxysmal vertigo, unspecified ear: Secondary | ICD-10-CM

## 2014-05-24 LAB — BASIC METABOLIC PANEL WITH GFR
BUN: 12 mg/dL (ref 6–23)
CALCIUM: 10.3 mg/dL (ref 8.4–10.5)
CHLORIDE: 104 meq/L (ref 96–112)
CO2: 27 mEq/L (ref 19–32)
Creat: 0.8 mg/dL (ref 0.50–1.10)
GFR, Est African American: 89 mL/min
GFR, Est Non African American: 78 mL/min
Glucose, Bld: 80 mg/dL (ref 70–99)
Potassium: 4.5 mEq/L (ref 3.5–5.3)
Sodium: 139 mEq/L (ref 135–145)

## 2014-05-24 LAB — MAGNESIUM: Magnesium: 1.8 mg/dL (ref 1.5–2.5)

## 2014-05-24 NOTE — Patient Instructions (Signed)
General Instructions: Please come get a vitamin D level in mid April  Please follow up in 2-3 months, sooner if needed  I will let you know about your labs  Take care    Treatment Goals:  Goals (1 Years of Data) as of 05/24/14          As of Today 05/07/14 04/30/14 04/30/14 04/30/14     Blood Pressure   . Blood Pressure < 140/90  123/57 139/81 132/77 121/75 114/67     Result Component   . HEMOGLOBIN A1C < 7.0            Progress Toward Treatment Goals:  Treatment Goal 05/24/2014  Hemoglobin A1C at goal  Blood pressure at goal  Prevent falls -    Self Care Goals & Plans:  Self Care Goal 05/24/2014  Manage my medications take my medicines as prescribed; bring my medications to every visit; refill my medications on time; follow the sick day instructions if I am sick  Monitor my health keep track of my blood pressure; bring my glucose meter and log to each visit; keep track of my blood glucose; bring my blood pressure log to each visit; keep track of my weight; check my feet daily  Eat healthy foods drink diet soda or water instead of juice or soda  Be physically active find an activity I enjoy  Prevent falls -  Meeting treatment goals maintain the current self-care plan    Home Blood Glucose Monitoring 05/24/2014  Check my blood sugar 2 times a day  When to check my blood sugar before meals     Care Management & Community Referrals:  Referral 05/24/2014  Referrals made for care management support none needed  Referrals made to community resources none

## 2014-05-25 NOTE — Telephone Encounter (Signed)
Just want to keep you in the loop, FYI.

## 2014-05-25 NOTE — Progress Notes (Signed)
Subjective:    Patient ID: [name], female    DOB: 15-Mar-1950, 64 y.o.   MRN: [id number]  HPI Comments: 64 y.o with multiple chronic medical problems (DM, TIA, ventral hernia s/p repair, OA, hypothyroidism, HLD, GERD, chronic pain (h/a, back), GERD, fibromyalgia, diverticulosis/itis, DM 2 controlled, BPPV, carotid stenosis, chronic AF, HTN presents for f/u   1. Recently had bout of diverticulitis tx'ed with Flagy and Cipro then developed C. Diff.  Recently hosp. 2/18-2/22 for C.diff and diarrhea tx'ed with 14 d of Vanco po and d/c Metformin.  She is feeling better.  Diarrhea is almost resolved stool is more formed medium brown color..  She denies nausea/vomiting.  Due to hypok her Lasix 20 mg was stopped, PPI prilosec stopped. Also her chronic Kdur was stopped  2. Recent bout of BPPV with dizziness resolved.  She did vestibular rehab with relief of sx's and techniques learned in case sx's come back. She is no longer using a walker or needs a walker.    3. Chronic AF now on Xarelto (last Coumadin dose was Sunday prior to this visit.  following with Dr. Elie Confer and LB cards   4. Vit d def- noted 2/19 on vit D 50K units. Will repeat vitamin D level in 1 month lab visit only  5. Chronic back pain now off narcotics due to rebound h/a taking Extra Strength Tylenol 500 mg bid prn with relief.  Chronic h/a not taking Zanaflex b/c it makes her sedated.   6. H/o HTN-BP controlled 123/57  7. H/o DM controlled A1C 6.2 04/19/14-She was taken of Metformin and placed on Glipizide 2.5 mg due to diarrhea ? If from Metformin vs C.diff.  Meter readings today with avg 107 low of 69 (11 am), 77 and high of 142.  She checks 1-2 x per day cbg at home.  She states she tries not to skip meals but at times and financial issues where she cant eat as much as she would like.  Given food from food pantry today  SH: thinks she may have to let her house go into foreclousure due to all repair of older house and cant afford  plus concern for lead paint with older house and paint chips from the walls daily.     HM: will need Zoster in the future if not had.  HA1C due 07/18/2014, eye MD due 07/2014, will need prevnar 09/2014     Review of Systems  Respiratory: Negative for shortness of breath.   Cardiovascular: Negative for chest pain.  Gastrointestinal: Negative for nausea, vomiting, abdominal pain and diarrhea.  Neurological: Negative for dizziness.       Objective:   Physical Exam  Constitutional: She is oriented to person, place, and time. Vital signs are normal. She appears well-developed and well-nourished. She is cooperative. No distress.  HENT:  Head: Normocephalic and atraumatic.  Mouth/Throat: Abnormal dentition.  Eyes: Conjunctivae are normal. Right eye exhibits no discharge. Left eye exhibits no discharge. No scleral icterus.  Cardiovascular: Normal rate and normal heart sounds.  An irregularly irregular rhythm present.  No murmur heard. Not lower ext edema  Pulmonary/Chest: Effort normal and breath sounds normal. No respiratory distress. She has no wheezes.  Abdominal: Soft. Bowel sounds are normal. There is no tenderness.  Neurological: She is alert and oriented to person, place, and time. Gait normal.  Skin: Skin is warm, dry and intact. No rash noted. She is not diaphoretic.  Psychiatric: She has a normal mood and affect.  Her speech is normal and behavior is normal. Judgment and thought content normal. Cognition and memory are normal.  Nursing note and vitals reviewed.         Assessment & Plan:  F/u in 2-3 months

## 2014-05-25 NOTE — Assessment & Plan Note (Signed)
On 50K units Lab visit ordered for future 1 month to check and see what dose pt should be on vit d  Will f/u

## 2014-05-25 NOTE — Assessment & Plan Note (Signed)
Resolved s/p vestibular rehab

## 2014-05-25 NOTE — Assessment & Plan Note (Signed)
will need Zoster in the future if not had.  HA1C due 07/18/2014, eye MD due 07/2014, will need prevnar 09/2014

## 2014-05-25 NOTE — Assessment & Plan Note (Signed)
Now on Xarelto

## 2014-05-25 NOTE — Assessment & Plan Note (Signed)
Some relief with Xtra Strength Tylenol off narcotics 2/2 rebound h/a

## 2014-05-25 NOTE — Assessment & Plan Note (Signed)
House at risk of losing 2/2 finances, given food from pantry today

## 2014-05-25 NOTE — Assessment & Plan Note (Signed)
BP Readings from Last 3 Encounters:  05/24/14 123/57  05/07/14 139/81  04/30/14 132/77    Lab Results  Component Value Date   NA 139 05/24/2014   K 4.5 05/24/2014   CREATININE 0.80 05/24/2014    Assessment: Blood pressure control: controlled Progress toward BP goal:  at goal Comments: none  Plan: Medications:  continue current medications (Atenolol 37.5, Dilt 180, cozaar 12.5, off Lasix)  Other plans: check BMET today

## 2014-05-25 NOTE — Assessment & Plan Note (Signed)
Resolving s/p 14 d Vanc Will check electrolytes today BMET, Mag

## 2014-05-25 NOTE — Assessment & Plan Note (Signed)
Lab Results  Component Value Date   HGBA1C 6.2 04/19/2014   HGBA1C 6.6 12/21/2013   HGBA1C 8.2 09/07/2013     Assessment: Diabetes control: good control (HgbA1C at goal) Progress toward A1C goal:  at goal Comments: last 6.2 04/30/14   Plan: Medications:  continue current medications Glipizide 2.5 mg qd take with largest meal Home glucose monitoring: Frequency: 2 times a day Timing: before meals Instruction/counseling given: hypoglycemia symptoms, avoid skipping meals  Other plans: f/u in 5 or 6 2016 repeat HA1C, due for eye exam 07/2014

## 2014-05-29 NOTE — Progress Notes (Signed)
Case discussed with Dr. McLean at time of visit.  We reviewed the resident's history and exam and pertinent patient test results.  I agree with the assessment, diagnosis, and plan of care documented in the resident's note.  

## 2014-06-05 ENCOUNTER — Telehealth: Payer: Self-pay | Admitting: Pharmacist

## 2014-06-13 ENCOUNTER — Other Ambulatory Visit: Payer: Commercial Managed Care - HMO

## 2014-06-13 ENCOUNTER — Encounter: Payer: Self-pay | Admitting: Internal Medicine

## 2014-06-13 ENCOUNTER — Ambulatory Visit (INDEPENDENT_AMBULATORY_CARE_PROVIDER_SITE_OTHER): Payer: Commercial Managed Care - HMO | Admitting: Internal Medicine

## 2014-06-13 VITALS — BP 118/62 | HR 88 | Ht 63.0 in | Wt 184.5 lb

## 2014-06-13 DIAGNOSIS — Z8619 Personal history of other infectious and parasitic diseases: Secondary | ICD-10-CM | POA: Diagnosis not present

## 2014-06-13 DIAGNOSIS — R197 Diarrhea, unspecified: Secondary | ICD-10-CM | POA: Diagnosis not present

## 2014-06-13 MED ORDER — METRONIDAZOLE 250 MG PO TABS: 250.0000 mg | ORAL_TABLET | Freq: Four times a day (QID) | ORAL | Status: DC

## 2014-06-13 NOTE — Addendum Note (Signed)
Addended by: Hulan Fray on: 06/13/2014 06:16 PM   Modules accepted: Orders

## 2014-06-13 NOTE — Progress Notes (Signed)
HISTORY OF PRESENT ILLNESS:  [name] is a 64 y.o. female with multiple medical problems who presents today for GI follow-up after recent hospitalization. I initially saw this patient upon Dr. Buel Ream retirement 10/26/2013. See that dictation for summary of her GI history.. The patient was seen in this office for diverticulitis. She was treated accordingly with ciprofloxacin and metronidazole. She subsequently developed a severe diarrheal illness and was admitted to the hospital and diagnosed with Clostridium difficile. She was treated with vancomycin and improved. Required assistance obtaining her medication due to cost. Reports recurrent diarrhea beginning about one week ago. Multiple episodes over the past few days. No abdominal pain, fever, or bleeding. She does not look toxic. No other complaints.  REVIEW OF SYSTEMS:  All non-GI ROS negative except for arthritis, back pain, headaches, irregular heartbeat  Past Medical History  Diagnosis Date  . Depression   . Obesity   . Skin neoplasm   . Bunion   . Carotid stenosis   . Fibromyalgia   . Internal hemorrhoid   . GERD (gastroesophageal reflux disease)   . Chronic gastritis   . Transaminase or LDH elevation   . Mild cognitive impairment   . Hyperlipidemia   . Fatty liver   . Dysphagia     no documented strictures but responded positively to dilation in past.   . Hypertension   . Cholecystitis     s/p cholecystectomy  . Sarcoidosis   . Fibromyalgia   . CHF (congestive heart failure)   . Atrial fibrillation   . Exertional dyspnea   . Hypothyroidism   . Hypothyroid   . Diverticulosis   . Digoxin toxicity     08/2011  . Bradycardia, drug induced     08/2011 (dig)  . Drug-induced hypotension     08/2011 (Dig)  . Carpal tunnel syndrome     mild   . Dysrhythmia     HX OF ATRIAL FIBRILATION  . Diverticulitis     dx 04/08/14  . Family history of adverse reaction to anesthesia     "my mother may have"  . Pneumonia X 2   . OSA (obstructive sleep apnea) dx'd 2003    "can't sleep w/that mask; lost some weight; maybe that's helped" (04/26/2014)  . Diabetic peripheral neuropathy   . Type II diabetes mellitus   . H/O hiatal hernia     S/P hernia repair  . Epileptic seizure, tonic     .No meds since age of 32.  Marland Kitchen TIA (transient ischemic attack)     "don't know when I had it" (04/26/2014)  . Osteoarthritis   . Chronic back pain   . Skin cancer 1990's    "front of my right leg"    Past Surgical History  Procedure Laterality Date  . Hiatal hernia repair  1990    "had to have scar tissue removed 6 months after repair"  . Esophageal dilation    . Cataract extraction w/phaco  10/27/2010    Procedure: CATARACT EXTRACTION PHACO AND INTRAOCULAR LENS PLACEMENT (IOC);  Surgeon: Williams Che;  Location: [facility];  Service: Ophthalmology;  Laterality: Left;  CDE- 1.78  . Cataract extraction w/phaco  07/13/2011    Procedure: CATARACT EXTRACTION PHACO AND INTRAOCULAR LENS PLACEMENT (IOC);  Surgeon: Williams Che, MD;  Location: [facility];  Service: Ophthalmology;  Laterality: Right;  CDE:  1.65  . Liposuction  1992  . Inverted nipples  1992  . Breast surgery Right ~ 2010  excision milk duct;  . Foot surgery Left ~ 2011    "straightened toe & scraped bone below big toe"  . Foot surgery Right ~ 2011    "scraped bone of big toe; shortened middle toet"  . Dilation and curettage of uterus  1970; 1976  . Abdominal hysterectomy  1980's    partial  . Tubal ligation  1976  . Bilateral oophorectomy  1980's    "after partial hysterectomy"  . Skin cancer excision  1990's    "front side of right shin"  . Ventral hernia repair  07/29/2011    Procedure: LAPAROSCOPIC VENTRAL HERNIA;  Surgeon: Adin Hector, MD;  Location: [facility];  Service: General;  Laterality: N/A;  multiple incarcerated hernias with mesh  . Esophageal dilation  multiple  . Breast biopsy Right     benign  . Salpingoophorectomy Bilateral     "6 months or  so after hysterectomy"  . Laparoscopic cholecystectomy  1980's    Social History [name]  reports that she has never smoked. She has never used smokeless tobacco. She reports that she does not drink alcohol or use illicit drugs.  family history includes Anesthesia problems in her mother; Arthritis in her mother; Cancer in her father; Colitis in her mother; Colon cancer in her paternal uncle; Coronary artery disease in her father; Crohn's disease in her mother; Dementia in her maternal uncle; Depression in her maternal uncle; Diabetes in her father and sister; Heart disease in her father; Melanoma in her father. There is no history of Hypotension, Malignant hyperthermia, or Pseudochol deficiency.  Allergies  Allergen Reactions  . Gemfibrozil Swelling    REACTION: Angioedema  . Latex Other (See Comments)    Blisters where touched or applied  . Penicillins Hives    Will spread in patches all over the body.  . Adhesive [Tape] Other (See Comments)    Will blister skin where applied - do not use BAND-AIDS.  Marland Kitchen Codeine Hives    Will spread in patches all over the body.  . Digoxin And Related     Makes BP drop  . Metformin And Related Diarrhea  . Omeprazole Diarrhea  . Ace Inhibitors Rash    Cough and rash        PHYSICAL EXAMINATION: Vital signs: BP 118/62 mmHg  Pulse 88  Ht 5\' 3"  (1.6 m)  Wt 184 lb 8 oz (83.689 kg)  BMI 32.69 kg/m2  LMP 06/03/1978 General: Well-developed, well-nourished, no acute distress HEENT: Sclerae are anicteric, conjunctiva pink. Oral mucosa intact Lungs: Clear Heart: Regular Abdomen: soft, nontender, nondistended, no obvious ascites, no peritoneal signs, normal bowel sounds. No organomegaly. Extremities: No edema Psychiatric: alert and oriented x3. Cooperative   ASSESSMENT:  #1. Recurrent diarrhea. 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 Check stool for Clostridium difficile by PCA #2. Empirically initiate metronidazole 250  mg 4 times a day. 2 weeks prescribed with 1 refill. #3. If Clostridium difficile returns positive and metronidazole working, then fine. If metronidazole not working, then she will require vancomycin (liquid suspension). This was priced out at $224 per month at 125 mg 4 times a day 28 days per [facility] #4. If Clostridium difficile negative, then antidiarrheals #5. Follow up, if needed, to be determined

## 2014-06-13 NOTE — Patient Instructions (Signed)
Your physician has requested that you go to the basement for the following lab work before leaving today:  C Diff  We have sent the following medications to your pharmacy for you to pick up at your convenience:  Flagyl

## 2014-06-14 ENCOUNTER — Other Ambulatory Visit: Payer: Commercial Managed Care - HMO

## 2014-06-14 DIAGNOSIS — Z8619 Personal history of other infectious and parasitic diseases: Secondary | ICD-10-CM

## 2014-06-14 DIAGNOSIS — R197 Diarrhea, unspecified: Secondary | ICD-10-CM

## 2014-06-15 LAB — CLOSTRIDIUM DIFFICILE BY PCR: Toxigenic C. Difficile by PCR: DETECTED — CR

## 2014-06-15 NOTE — Progress Notes (Signed)
Quick Note:  C diff is + Dr. Henrene Pastor wanted to try metronidazole which he started  Let her know she has C diff again - if the metronidazole is not helping she should start vancomycin125 mg gid x 28 days - see his last note ______

## 2014-06-18 NOTE — Telephone Encounter (Addendum)
[name] is a 64 y.o. female who reports to the clinic for monitoring of rivaroxaban (Xarelto) therapy.    ASSESSMENT Indication(s): atrial fibrillation Duration: indefinite  Labs:    Component Value Date/Time   AST 27 04/26/2014 1654   ALT 18 04/26/2014 1654   NA 139 05/24/2014 1608   K 4.5 05/24/2014 1608   CL 104 05/24/2014 1608   CO2 27 05/24/2014 1608   GLUCOSE 80 05/24/2014 1608   HGBA1C 6.2 04/19/2014 1541   HGBA1C 6.3* 07/29/2011 1422   BUN 12 05/24/2014 1608   CREATININE 0.80 05/24/2014 1608   CREATININE 0.75 04/27/2014 0630   CALCIUM 10.3 05/24/2014 1608   GFRNONAA 78 05/24/2014 1608   GFRNONAA 87* 04/27/2014 0630   WBC 6.5 04/26/2014 1654   HGB 13.0 04/26/2014 1654   HCT 36.6 04/27/2014 0730   HCT 39.7 04/26/2014 1654   PLT 307 04/26/2014 1654    Anticoagulant Dose: xarelto 20 mg daily  Adherence: Patient has no known adherence challenges.   No side effects associated with anticoagulant.   Safety: Patient reports no recent signs or symptoms of bleeding, no signs of symptoms of thrombosis. Medication changes no.  RECOMMENDATIONS  Medication: no changes recommended at this time   Monitoring: CBC and LFT. (BMP done 05/24/14, transitioned to [place] on 05/21/14) After transition from warfarin, European Society of Cardiology [facility]) Guidelines suggest initial 1 month follow up to evaluate adherence, thromboembolic or bleeding events, other side effects, co-medications, and potential need for blood sampling (hemoglobin, renal/hepatic function). Thereafter, consideration of these same factors should be made 3 months, then 6 months after transition if possible according to ESC (no American guidelines currently available).  Patient Instructions: There are no Patient Instructions on file for this visit.  Follow-up No Follow-up on file.  [place] Pharmacist 06/18/2014, 2:49 PM

## 2014-06-21 ENCOUNTER — Telehealth: Payer: Self-pay | Admitting: *Deleted

## 2014-06-21 ENCOUNTER — Other Ambulatory Visit (INDEPENDENT_AMBULATORY_CARE_PROVIDER_SITE_OTHER): Payer: Commercial Managed Care - HMO

## 2014-06-21 DIAGNOSIS — E559 Vitamin D deficiency, unspecified: Secondary | ICD-10-CM | POA: Diagnosis not present

## 2014-06-21 NOTE — Telephone Encounter (Signed)
Walk in, she states she needs help getting xarelto, referred to dr Maudie Mercury

## 2014-06-22 ENCOUNTER — Other Ambulatory Visit: Payer: Self-pay | Admitting: Internal Medicine

## 2014-06-22 LAB — VITAMIN D 25 HYDROXY (VIT D DEFICIENCY, FRACTURES): Vit D, 25-Hydroxy: 30 ng/mL (ref 30–100)

## 2014-06-22 MED ORDER — CHOLECALCIFEROL 25 MCG (1000 UT) PO CAPS: 1000.0000 [IU] | ORAL_CAPSULE | Freq: Every day | ORAL | Status: DC

## 2014-06-25 NOTE — Telephone Encounter (Signed)
Pt had walked in and stated she was going to need some help getting her med, referred to dr Maudie Mercury

## 2014-07-02 ENCOUNTER — Ambulatory Visit (INDEPENDENT_AMBULATORY_CARE_PROVIDER_SITE_OTHER): Payer: Commercial Managed Care - HMO | Admitting: Pharmacist

## 2014-07-02 DIAGNOSIS — Z7901 Long term (current) use of anticoagulants: Secondary | ICD-10-CM

## 2014-07-02 DIAGNOSIS — I482 Chronic atrial fibrillation: Secondary | ICD-10-CM | POA: Diagnosis not present

## 2014-07-02 NOTE — Progress Notes (Signed)
Agree with continued efforts as outlined above.

## 2014-07-02 NOTE — Patient Instructions (Signed)
  Patient instructed to CONTINUE to take Xarelto 20mg  by mouth daily with evening meal.  today's dose.  Patient verbalized understanding of these instructions.

## 2014-07-02 NOTE — Progress Notes (Signed)
Seen in conjunction with Dr. Mannie Stabile who has drafted a letter which was signed by Dr. Dario Guardian:  Providing information to the [facility] and [facility] to determine if their "line in the sand" can be "re-drawn" on the amount they wish the patient to contribute to the costs of rivaroxaban/Xarelto. Dr. Maudie Mercury cited in her letter that the time in target range was very low for the patient during her attempts to use warfarin with efficacy and safety. Her SAMe-TT2-R2 score was >2 suggesting that based upon the parameters of this scoring rubric that the patient may not do well on warfarin--especially as it relates to time in target range. Patient is provided today manufacturer's samples which will see her through next Monday. We will continue to assure that the patient is being provided manufacturer's samples without interruption of therapy until we can resolve with the [employer] their ability to provide more patient assistance. Her [employer] co-pay for rivaroxaban is $50 which she states is still an un-due burden financially. Emphasis to the patient was placed upon no interruption in therapy. She understands.

## 2014-07-10 ENCOUNTER — Other Ambulatory Visit: Payer: Self-pay | Admitting: Internal Medicine

## 2014-07-19 ENCOUNTER — Other Ambulatory Visit: Payer: Self-pay | Admitting: Internal Medicine

## 2014-07-19 DIAGNOSIS — Z0100 Encounter for examination of eyes and vision without abnormal findings: Principal | ICD-10-CM

## 2014-07-19 DIAGNOSIS — E119 Type 2 diabetes mellitus without complications: Secondary | ICD-10-CM

## 2014-07-23 ENCOUNTER — Other Ambulatory Visit: Payer: Self-pay | Admitting: Internal Medicine

## 2014-07-25 ENCOUNTER — Encounter: Payer: Self-pay | Admitting: *Deleted

## 2014-08-14 ENCOUNTER — Ambulatory Visit: Payer: Commercial Managed Care - HMO | Admitting: Cardiology

## 2014-08-15 ENCOUNTER — Telehealth: Payer: Self-pay | Admitting: Licensed Clinical Social Worker

## 2014-08-15 NOTE — Telephone Encounter (Signed)
[name] was referred to CSW to follow up on pt's Xarelto PAP application.  Pt's PCP received letter on  07/13/14 from Xarelto PAP of pt's ineligibility.  CSW placed call to PAP today to inquire about appeal.  In order to be approved for appeal, pt must have spent at least 3% of income on pharmacy, according to PAP $453 for this year.  CSW placed call to [name], pt aware of ineligibility and does not think she has spent $450 on prescriptions this year.  Pt states she can not afford $50/month for one medication and request to be put back on coumadin, as this is free through her insurance.  CSW discussed the copayment cost for coumadin clinic visit as a potential to offset cost, pt prefers to go back on coumadin.  [name] aware this worker will forward to Dr. Elie Confer.

## 2014-08-20 ENCOUNTER — Ambulatory Visit: Payer: Commercial Managed Care - HMO | Admitting: Pharmacist

## 2014-08-20 NOTE — Progress Notes (Signed)
Patient remains upon Xarelto 20mg  PO once-daily with evening meal. She indicates she wishes to remain on Xarelto and is prepared to pay the $47 co-pay--"rather than go back on warfarin". Her pharmacy was contacted at [phone] and she has refills remaining on Xarelto and Pharmacist confirmed it would be $47 co-pay.

## 2014-08-20 NOTE — Patient Instructions (Signed)
Patient instructed to take medications Xarelto 20mg  once-daily with evening meal.  Patient instructed to take today's dose.  Patient verbalized understanding of these instructions.

## 2014-08-22 ENCOUNTER — Ambulatory Visit (INDEPENDENT_AMBULATORY_CARE_PROVIDER_SITE_OTHER): Payer: Commercial Managed Care - HMO | Admitting: Cardiology

## 2014-08-22 ENCOUNTER — Encounter: Payer: Self-pay | Admitting: Cardiology

## 2014-08-22 VITALS — BP 116/76 | HR 80 | Ht 63.0 in | Wt 181.8 lb

## 2014-08-22 DIAGNOSIS — I6523 Occlusion and stenosis of bilateral carotid arteries: Secondary | ICD-10-CM | POA: Diagnosis not present

## 2014-08-22 NOTE — Patient Instructions (Signed)
Your physician wants you to follow-up in: 1 Year You will receive a reminder letter in the mail two months in advance. If you don't receive a letter, please call our office to schedule the follow-up appointment.  Your physician has requested that you have a carotid duplex. This test is an ultrasound of the carotid arteries in your neck. It looks at blood flow through these arteries that supply the brain with blood. Allow one hour for this exam. There are no restrictions or special instructions. In [place]

## 2014-08-22 NOTE — Progress Notes (Signed)
Cardiology Office Note   Date:  08/22/2014   ID:  SYDNEY HASTEN, DOB 06-25-1950, MRN [id number]  PCP:  Cresenciano Genre, MD  Cardiologist:   Minus Breeding, MD   Chief Complaint  Patient presents with  . Atrial Fibrillation      History of Present Illness: [name] is a 64 y.o. female who presents for follow-up of atrial fibrillation. I haven't seen her since 2013. She was seen in our clinic last year. She has permanent atrial fibrillation. She has done well. She denies any cardiovascular symptoms, she is very stressed when she might feel her heart racing. She doesn't have any resting symptoms. She doesn't have any shortness of breath, PND or orthopnea. She has no palpitations, presyncope or syncope. She has no chest pressure, neck or arm discomfort. She has no weight gain or edema.   Past Medical History  Diagnosis Date  . Depression   . Skin neoplasm   . Bunion   . Carotid stenosis   . Fibromyalgia   . Internal hemorrhoid   . GERD (gastroesophageal reflux disease)   . Chronic gastritis   . Transaminase or LDH elevation   . Mild cognitive impairment   . Hyperlipidemia   . Fatty liver   . Dysphagia     no documented strictures but responded positively to dilation in past.   . Hypertension   . Cholecystitis     s/p cholecystectomy  . Sarcoidosis   . Fibromyalgia   . CHF (congestive heart failure)   . Atrial fibrillation   . Hypothyroid   . Diverticulosis   . Digoxin toxicity     08/2011  . Bradycardia, drug induced     08/2011 (dig)  . Drug-induced hypotension     08/2011 (Dig)  . Carpal tunnel syndrome     mild   . Diverticulitis     dx 04/08/14  . Family history of adverse reaction to anesthesia     "my mother may have"  . Pneumonia X 2  . OSA (obstructive sleep apnea) dx'd 2003    "can't sleep w/that mask; lost some weight; maybe that's helped" (04/26/2014)  . Diabetic peripheral neuropathy   . Type II diabetes mellitus   . H/O hiatal hernia    S/P hernia repair  . Epileptic seizure, tonic     .No meds since age of 56.  Marland Kitchen TIA (transient ischemic attack)     "don't know when I had it" (04/26/2014)  . Osteoarthritis   . Chronic back pain     Past Surgical History  Procedure Laterality Date  . Hiatal hernia repair  1990    "had to have scar tissue removed 6 months after repair"  . Esophageal dilation    . Cataract extraction w/phaco  10/27/2010    Procedure: CATARACT EXTRACTION PHACO AND INTRAOCULAR LENS PLACEMENT (IOC);  Surgeon: Williams Che;  Location: [facility];  Service: Ophthalmology;  Laterality: Left;  CDE- 1.78  . Cataract extraction w/phaco  07/13/2011    Procedure: CATARACT EXTRACTION PHACO AND INTRAOCULAR LENS PLACEMENT (IOC);  Surgeon: Williams Che, MD;  Location: [facility];  Service: Ophthalmology;  Laterality: Right;  CDE:  1.65  . Liposuction  1992  . Inverted nipples  1992  . Breast surgery Right ~ 2010    excision milk duct;  . Foot surgery Left ~ 2011    "straightened toe & scraped bone below big toe"  . Foot surgery Right ~ 2011    "  scraped bone of big toe; shortened middle toet"  . Dilation and curettage of uterus  1970; 1976  . Abdominal hysterectomy  1980's    partial  . Tubal ligation  1976  . Bilateral oophorectomy  1980's    "after partial hysterectomy"  . Skin cancer excision  1990's    "front side of right shin"  . Ventral hernia repair  07/29/2011    Procedure: LAPAROSCOPIC VENTRAL HERNIA;  Surgeon: Adin Hector, MD;  Location: [facility];  Service: General;  Laterality: N/A;  multiple incarcerated hernias with mesh  . Esophageal dilation  multiple  . Breast biopsy Right     benign  . Salpingoophorectomy Bilateral     "6 months or so after hysterectomy"  . Laparoscopic cholecystectomy  1980's     Current Outpatient Prescriptions  Medication Sig Dispense Refill  . atenolol (TENORMIN) 25 MG tablet TAKE 1 AND 1/2 TABLETS TWICE DAILY 270 tablet 1  . Blood Glucose Calibration (ACCU-CHEK  SMARTVIEW CONTROL) LIQD 1 Device by In Vitro route as needed. Use to perform control/ calibration on accu-chek meter. diag code E11.9. Non insulin dependent 3 each 0  . CARTIA XT 180 MG 24 hr capsule TAKE 1 CAPSULE EVERY DAY 90 capsule 1  . Cholecalciferol (CVS VITAMIN D3) 1000 UNITS capsule Take 1 capsule (1,000 Units total) by mouth daily. 90 capsule 1  . famotidine (PEPCID) 20 MG tablet Take 1 tablet (20 mg total) by mouth daily. 90 tablet 1  . FLUoxetine (PROZAC) 20 MG capsule Take 1 capsule (20 mg total) by mouth daily. 90 capsule 2  . glipiZIDE (GLUCOTROL) 5 MG tablet Take 0.5 tablets (2.5 mg total) by mouth daily before breakfast. 90 tablet 1  . Lancets 30G MISC DX Code: E11.9 Check blood sugars one time per day. Accucheck fastclix lancets 100 each 11  . levothyroxine (SYNTHROID, LEVOTHROID) 50 MCG tablet TAKE 1 TABLET EVERY MORNING 90 tablet 1  . losartan (COZAAR) 25 MG tablet Take 0.5 tablets (12.5 mg total) by mouth daily. 90 tablet 1  . pravastatin (PRAVACHOL) 40 MG tablet TAKE 1 TABLET DAILY 90 tablet 1  . Probiotic Product (TRUNATURE DIGESTIVE PROBIOTIC PO) Take 1 capsule by mouth daily.    . rivaroxaban (XARELTO) 20 MG TABS tablet Take 1 tablet (20 mg total) by mouth daily with supper. 30 tablet 2   No current facility-administered medications for this visit.    Allergies:   Gemfibrozil; Latex; Penicillins; Adhesive; Codeine; Digoxin and related; Metformin and related; Omeprazole; and Ace inhibitors    ROS:  Please see the history of present illness.   Otherwise, review of systems are positive for none.   All other systems are reviewed and negative.    PHYSICAL EXAM: VS:  BP 116/76 mmHg  Pulse 80  Ht 5\' 3"  (1.6 m)  Wt 181 lb 12.8 oz (82.464 kg)  BMI 32.21 kg/m2  LMP 06/03/1978 , BMI Body mass index is 32.21 kg/(m^2). GENERAL:  Well appearing HEENT:  Pupils equal round and reactive, fundi not visualized, oral mucosa unremarkable NECK:  No jugular venous distention,  waveform within normal limits, carotid upstroke brisk and symmetric, no bruits, no thyromegaly LUNGS:  Clear to auscultation bilaterally BACK:  No CVA tenderness CHEST:  Unremarkable HEART:  PMI not displaced or sustained,S1 and S2 within normal limits, no S3, no clicks, no rubs, no murmurs, irregular ABD:  Flat, positive bowel sounds normal in frequency in pitch, no bruits, no rebound, no guarding, no midline pulsatile mass, no hepatomegaly,  no splenomegaly EXT:  2 plus pulses throughout, no edema, no cyanosis no clubbing    Recent Labs: 09/04/2013: Pro B Natriuretic peptide (BNP) 989.5* 04/19/2014: TSH 1.494 04/26/2014: ALT 18; Hemoglobin 13.0; Platelets 307 05/24/2014: BUN 12; Creat 0.80; Magnesium 1.8; Potassium 4.5; Sodium 139    Lipid Panel    Component Value Date/Time   CHOL 133 04/19/2014 1603   TRIG 181* 04/19/2014 1603   HDL 46 04/19/2014 1603   CHOLHDL 2.9 04/19/2014 1603   VLDL 36 04/19/2014 1603   LDLCALC 51 04/19/2014 1603      Wt Readings from Last 3 Encounters:  08/22/14 181 lb 12.8 oz (82.464 kg)  06/13/14 184 lb 8 oz (83.689 kg)  05/24/14 182 lb 3.2 oz (82.645 kg)      Other studies Reviewed: Additional studies/ records that were reviewed today include:  Hospital records from admission this spring. Review of the above records demonstrates:  Please see elsewhere in the note.     ASSESSMENT AND PLAN:  ATRIAL FIB:  The patient has atrial fibrillation permanently. However, she tolerates anticoagulation she's had reasonable rate. At this point no change in therapy or further cardiovascular testing is indicated.   Current medicines are reviewed at length with the patient today.  The patient does not have concerns regarding medicines.  The following changes have been made:  no change  Labs/ tests ordered today include: None     Disposition:   FU with me in one year.     Signed, Minus Breeding, MD  08/22/2014 3:36 PM    [facility]  [facility]

## 2014-09-04 DIAGNOSIS — I6523 Occlusion and stenosis of bilateral carotid arteries: Secondary | ICD-10-CM | POA: Diagnosis not present

## 2014-09-05 ENCOUNTER — Ambulatory Visit (INDEPENDENT_AMBULATORY_CARE_PROVIDER_SITE_OTHER): Payer: Commercial Managed Care - HMO

## 2014-09-05 DIAGNOSIS — I6523 Occlusion and stenosis of bilateral carotid arteries: Secondary | ICD-10-CM

## 2014-09-11 ENCOUNTER — Other Ambulatory Visit: Payer: Self-pay | Admitting: Pharmacist

## 2014-09-11 DIAGNOSIS — F32A Depression, unspecified: Secondary | ICD-10-CM

## 2014-09-11 DIAGNOSIS — F329 Major depressive disorder, single episode, unspecified: Secondary | ICD-10-CM

## 2014-09-11 DIAGNOSIS — K219 Gastro-esophageal reflux disease without esophagitis: Secondary | ICD-10-CM

## 2014-09-11 NOTE — Telephone Encounter (Signed)
[name] is a 64 y.o. female who was contacted for follow-up after switch from warfarin to  rivaroxaban (Xarelto) therapy.    ASSESSMENT Indication(s): atrial fibrillation and TIA Duration: indefinite Date switched from warfarin: 05/21/14  Labs:    Component Value Date/Time   AST 27 04/26/2014 1654   ALT 18 04/26/2014 1654   NA 139 05/24/2014 1608   K 4.5 05/24/2014 1608   CL 104 05/24/2014 1608   CO2 27 05/24/2014 1608   BUN 12 05/24/2014 1608   CREATININE 0.80 05/24/2014 1608   CREATININE 0.75 04/27/2014 0630   CALCIUM 10.3 05/24/2014 1608   GFRNONAA 78 05/24/2014 1608   GFRNONAA 87* 04/27/2014 0630   WBC 6.5 04/26/2014 1654   HGB 13.0 04/26/2014 1654   HCT 36.6 04/27/2014 0730   HCT 39.7 04/26/2014 1654   PLT 307 04/26/2014 1654   rivaroxaban (Xarelto) Dose: 20 mg daily with supper  Adherence: Patient has adherence challenges including cost. Patient states she is able to afford at this time and will contact us if having affordability issues in the future. Patient requested refills on famotidine and fluoxetine.  Safety: Patient reports no recent signs or symptoms of bleeding, no signs of symptoms of thromboembolism. Medication changes: no.  RECOMMENDATIONS  Medication: no changes recommended at this time  Monitoring: next office visit, CMP and CBC. After transition from warfarin, [facility] Society [facility]) Guidelines suggest initial 1 month follow up to evaluate adherence, thromboembolic or bleeding events, other side effects, co-medications, and potential need for blood sampling (hemoglobin, renal/hepatic function). Thereafter, consideration of these same factors should be made 3 months, then 6 months after transition if possible according to ESC (no American guidelines currently available).  Patient Instructions: Patient advised to contact me if questions/concerns arise.  [place] Pharmacist 09/11/2014, 5:38 PM

## 2014-09-12 MED ORDER — FLUOXETINE HCL 20 MG PO CAPS: 20.0000 mg | ORAL_CAPSULE | Freq: Every day | ORAL | Status: DC

## 2014-09-12 MED ORDER — FAMOTIDINE 20 MG PO TABS: 20.0000 mg | ORAL_TABLET | Freq: Every day | ORAL | Status: DC

## 2014-10-02 ENCOUNTER — Telehealth: Payer: Self-pay | Admitting: Internal Medicine

## 2014-10-02 NOTE — Telephone Encounter (Signed)
Call to patient to confirm appointment for 10/03/14 at 1:45 lmtcb

## 2014-10-03 ENCOUNTER — Encounter: Payer: Self-pay | Admitting: Internal Medicine

## 2014-10-03 ENCOUNTER — Ambulatory Visit (INDEPENDENT_AMBULATORY_CARE_PROVIDER_SITE_OTHER): Payer: Commercial Managed Care - HMO | Admitting: Internal Medicine

## 2014-10-03 VITALS — BP 119/65 | HR 84 | Temp 98.1°F | Ht 63.0 in | Wt 190.6 lb

## 2014-10-03 DIAGNOSIS — F329 Major depressive disorder, single episode, unspecified: Secondary | ICD-10-CM

## 2014-10-03 DIAGNOSIS — Z0000 Encounter for general adult medical examination without abnormal findings: Secondary | ICD-10-CM

## 2014-10-03 DIAGNOSIS — I10 Essential (primary) hypertension: Secondary | ICD-10-CM

## 2014-10-03 DIAGNOSIS — A047 Enterocolitis due to Clostridium difficile: Secondary | ICD-10-CM

## 2014-10-03 DIAGNOSIS — A498 Other bacterial infections of unspecified site: Secondary | ICD-10-CM

## 2014-10-03 DIAGNOSIS — F32A Depression, unspecified: Secondary | ICD-10-CM

## 2014-10-03 DIAGNOSIS — E039 Hypothyroidism, unspecified: Secondary | ICD-10-CM

## 2014-10-03 DIAGNOSIS — M21612 Bunion of left foot: Secondary | ICD-10-CM

## 2014-10-03 DIAGNOSIS — G8929 Other chronic pain: Secondary | ICD-10-CM

## 2014-10-03 DIAGNOSIS — M549 Dorsalgia, unspecified: Secondary | ICD-10-CM

## 2014-10-03 DIAGNOSIS — E119 Type 2 diabetes mellitus without complications: Secondary | ICD-10-CM | POA: Diagnosis not present

## 2014-10-03 LAB — GLUCOSE, CAPILLARY: Glucose-Capillary: 127 mg/dL — ABNORMAL HIGH (ref 65–99)

## 2014-10-03 LAB — POCT GLYCOSYLATED HEMOGLOBIN (HGB A1C): Hemoglobin A1C: 6.5

## 2014-10-03 MED ORDER — LOSARTAN POTASSIUM 25 MG PO TABS: 12.5000 mg | ORAL_TABLET | Freq: Every day | ORAL | Status: DC

## 2014-10-03 MED ORDER — RIVAROXABAN 20 MG PO TABS: 20.0000 mg | ORAL_TABLET | Freq: Every day | ORAL | Status: DC

## 2014-10-03 MED ORDER — DILTIAZEM HCL ER COATED BEADS 180 MG PO CP24: 180.0000 mg | ORAL_CAPSULE | Freq: Every day | ORAL | Status: DC

## 2014-10-03 MED ORDER — ATENOLOL 25 MG PO TABS: ORAL_TABLET | ORAL | Status: DC

## 2014-10-03 NOTE — Patient Instructions (Signed)
1. Please return to see me in 3 months for follow-up.   2. I will send in your medication refills.  Please take all medications as prescribed.    3. If you have worsening of your symptoms or new symptoms arise, please call the clinic ([phone]), or go to the ER immediately if symptoms are severe.

## 2014-10-03 NOTE — Assessment & Plan Note (Signed)
Stable on 50 mcg Synthroid.

## 2014-10-03 NOTE — Assessment & Plan Note (Addendum)
Lab Results  Component Value Date   HGBA1C 6.5 10/03/2014   HGBA1C 6.2 04/19/2014   HGBA1C 6.6 12/21/2013     Assessment: Diabetes control:  well controlled Progress toward A1C goal:   at goal Comments: She was surprised A1c was good.  She admits to eating a lot of icecream since it has been so hot.  She is compliant with med.    Plan: Medications:  continue current medications: glipizide 2.5mg  daily Home glucose monitoring:   Frequency:   Timing:   Instruction/counseling given: reminded to get eye exam and discussed diet; she was advised to limit icecream Educational resources provided: brochure (denies) Self management tools provided:   Other plans: Eye exam scheduled for Aug 7.  RTC in 3 months.

## 2014-10-03 NOTE — Assessment & Plan Note (Addendum)
BP Readings from Last 3 Encounters:  10/03/14 119/65  08/22/14 116/76  06/13/14 118/62    Lab Results  Component Value Date   NA 139 05/24/2014   K 4.5 05/24/2014   CREATININE 0.80 05/24/2014    Assessment: Blood pressure control:  well controlled   Progress toward BP goal:   at goal Comments: Compliant with medications.  Plan: Medications:  continue current medications:  Atenolol 37.5mg  BID, diltiazem 180mg  daily, losartan 12.5mg  daily Educational resources provided: brochure (denies) Self management tools provided:   Other plans: RTC in 3 months

## 2014-10-03 NOTE — Progress Notes (Signed)
Subjective:    Patient ID: [name], female    DOB: 1951-01-31, 64 y.o.   MRN: [id number]  HPI Comments: [name] is a 64 year old woman with PMH as below here for follow-up of chronic conditions.  Please see problem based charting for A&P.    Past Medical History  Diagnosis Date  . Depression   . Skin neoplasm   . Bunion   . Carotid stenosis   . Fibromyalgia   . Internal hemorrhoid   . GERD (gastroesophageal reflux disease)   . Chronic gastritis   . Transaminase or LDH elevation   . Mild cognitive impairment   . Hyperlipidemia   . Fatty liver   . Dysphagia     no documented strictures but responded positively to dilation in past.   . Hypertension   . Cholecystitis     s/p cholecystectomy  . Sarcoidosis   . Fibromyalgia   . CHF (congestive heart failure)   . Atrial fibrillation   . Hypothyroid   . Diverticulosis   . Digoxin toxicity     08/2011  . Bradycardia, drug induced     08/2011 (dig)  . Drug-induced hypotension     08/2011 (Dig)  . Carpal tunnel syndrome     mild   . Diverticulitis     dx 04/08/14  . Family history of adverse reaction to anesthesia     "my mother may have"  . Pneumonia X 2  . OSA (obstructive sleep apnea) dx'd 2003    "can't sleep w/that mask; lost some weight; maybe that's helped" (04/26/2014)  . Diabetic peripheral neuropathy   . Type II diabetes mellitus   . H/O hiatal hernia     S/P hernia repair  . Epileptic seizure, tonic     .No meds since age of 64.  Marland Kitchen TIA (transient ischemic attack)     "don't know when I had it" (04/26/2014)  . Osteoarthritis   . Chronic back pain    Current Outpatient Prescriptions on File Prior to Visit  Medication Sig Dispense Refill  . atenolol (TENORMIN) 25 MG tablet TAKE 1 AND 1/2 TABLETS TWICE DAILY 270 tablet 1  . Blood Glucose Calibration (ACCU-CHEK SMARTVIEW CONTROL) LIQD 1 Device by In Vitro route as needed. Use to perform control/ calibration on accu-chek meter. diag code E11.9. Non insulin  dependent 3 each 0  . CARTIA XT 180 MG 24 hr capsule TAKE 1 CAPSULE EVERY DAY 90 capsule 1  . Cholecalciferol (CVS VITAMIN D3) 1000 UNITS capsule Take 1 capsule (1,000 Units total) by mouth daily. 90 capsule 1  . famotidine (PEPCID) 20 MG tablet Take 1 tablet (20 mg total) by mouth daily. 90 tablet 1  . FLUoxetine (PROZAC) 20 MG capsule Take 1 capsule (20 mg total) by mouth daily. 90 capsule 2  . glipiZIDE (GLUCOTROL) 5 MG tablet Take 0.5 tablets (2.5 mg total) by mouth daily before breakfast. 90 tablet 1  . Lancets 30G MISC DX Code: E11.9 Check blood sugars one time per day. Accucheck fastclix lancets 100 each 11  . levothyroxine (SYNTHROID, LEVOTHROID) 50 MCG tablet TAKE 1 TABLET EVERY MORNING 90 tablet 1  . losartan (COZAAR) 25 MG tablet Take 0.5 tablets (12.5 mg total) by mouth daily. 90 tablet 1  . pravastatin (PRAVACHOL) 40 MG tablet TAKE 1 TABLET DAILY 90 tablet 1  . Probiotic Product (TRUNATURE DIGESTIVE PROBIOTIC PO) Take 1 capsule by mouth daily.    . rivaroxaban (XARELTO) 20 MG TABS tablet Take  1 tablet (20 mg total) by mouth daily with supper. 30 tablet 2   No current facility-administered medications on file prior to visit.    Review of Systems  Constitutional: Negative for fever, chills and appetite change.  Respiratory: Negative for shortness of breath.   Cardiovascular: Positive for palpitations. Negative for chest pain and leg swelling.       Afib  Gastrointestinal: Negative for nausea, vomiting, abdominal pain, diarrhea, constipation and blood in stool.       Having loose stools about once/week  Genitourinary: Negative for hematuria, difficulty urinating and dyspareunia.       Filed Vitals:   10/03/14 1357  BP: 119/65  Pulse: 84  Temp: 98.1 F (36.7 C)  TempSrc: Oral  Height: 5\' 3"  (1.6 m)  Weight: 190 lb 9.6 oz (86.456 kg)  SpO2: 97%    Objective:   Physical Exam  Constitutional: She is oriented to person, place, and time. She appears well-developed. No  distress.  HENT:  Head: Normocephalic and atraumatic.  Mouth/Throat: No oropharyngeal exudate.  Eyes: EOM are normal. Pupils are equal, round, and reactive to light.  Neck: Neck supple.  Cardiovascular: Normal rate, regular rhythm and normal heart sounds.  Exam reveals no gallop and no friction rub.   No murmur heard. Pulmonary/Chest: Effort normal and breath sounds normal. No respiratory distress. She has no wheezes. She has no rales.  Abdominal: Soft. Bowel sounds are normal. She exhibits no distension. There is no tenderness. There is no rebound.  Musculoskeletal: Normal range of motion. She exhibits no edema or tenderness.  Neurological: She is alert and oriented to person, place, and time. No cranial nerve deficit.  Skin: Skin is warm. She is not diaphoretic.  Psychiatric: She has a normal mood and affect. Her behavior is normal.  Vitals reviewed.         Assessment & Plan:  Please see problem based charting for A&P.

## 2014-10-03 NOTE — Assessment & Plan Note (Signed)
Stable on Prozac.  Denies anhedonia or SI.

## 2014-10-03 NOTE — Assessment & Plan Note (Signed)
Chronic.  No loss of bowel or bladder control, saddle anesthesia, gait abnormality.  MRI from 05/2013 reviewed and reveals OA without spinal stenosis.

## 2014-10-04 NOTE — Assessment & Plan Note (Addendum)
She feels this has resolved.  On ROS she reports having loose stools about 1-2x per week.  She says this is nothing like when she had cdiff and was going constantly.  She is afebrile.  Exam is benign.  No reason to suspect recurrent cdiff based on her description.

## 2014-10-04 NOTE — Assessment & Plan Note (Signed)
She will see Eye doctor Aug 7.   She declines Shingle's vaccine. Otherwise, UTD.

## 2014-10-05 NOTE — Progress Notes (Signed)
Medicine attending: Medical history, presenting problems, physical findings, and medications, reviewed with Dr Alex Wilson on the day of the patient visit   and I concur with her evaluation and management plan. 

## 2014-10-09 ENCOUNTER — Telehealth: Payer: Self-pay | Admitting: *Deleted

## 2014-10-09 NOTE — Telephone Encounter (Signed)
Requesting surgical clearance:   1. Type of surgery: 17 teeth Extractions  2. Surgeon: Dr Perlie Gold, D.D.S  3. Surgical date: Pending  4. Medications that need to be help: Xarelto  5. Phone/Faxed #: [phone]/ Faxed # [phone]  Pt saw you on 06/15, please let me know if Xarelto can be held and how long?  Clearance routed to Dr Percival Spanish

## 2014-10-10 NOTE — Telephone Encounter (Signed)
OK to hold Xarelto 2 days prior to teeth extraction.

## 2014-10-10 NOTE — Telephone Encounter (Signed)
Clearance was Faxed via Epic to Dr Perlie Gold

## 2014-10-25 ENCOUNTER — Telehealth: Payer: Self-pay | Admitting: *Deleted

## 2014-10-25 NOTE — Telephone Encounter (Signed)
PATIENT CONTACTED WITH HER APPOINTMENT WITH FOOT DR.PETERY.  9/7/016@ 10:00AM.

## 2014-11-08 ENCOUNTER — Telehealth: Payer: Self-pay | Admitting: Pharmacist

## 2014-11-08 NOTE — Telephone Encounter (Signed)
Patient contacted to see if further help needed with rivaroxaban. Patient reports no signs or symptoms of bleeding or thromboembolism and has been able to afford medication copay.  Signing off of case at this time due to successful transition from warfarin to rivaroxaban and will continue to be available for assistance with patient care in the future as necessary.  Patient verbalized understanding by repeating back and was advised to contact me if further medication assistance needed in the future.

## 2014-12-11 LAB — HM DIABETES EYE EXAM

## 2014-12-17 NOTE — Addendum Note (Signed)
Addended by: Hulan Fray on: 12/17/2014 05:16 PM   Modules accepted: Orders

## 2014-12-19 ENCOUNTER — Encounter: Payer: Self-pay | Admitting: Internal Medicine

## 2014-12-19 ENCOUNTER — Ambulatory Visit (INDEPENDENT_AMBULATORY_CARE_PROVIDER_SITE_OTHER): Payer: Commercial Managed Care - HMO | Admitting: Internal Medicine

## 2014-12-19 ENCOUNTER — Ambulatory Visit (HOSPITAL_COMMUNITY)
Admission: RE | Admit: 2014-12-19 | Discharge: 2014-12-19 | Disposition: A | Discharge status: Home / Self Care | Payer: Commercial Managed Care - HMO | Source: Ambulatory Visit | Attending: Internal Medicine | Admitting: Internal Medicine

## 2014-12-19 VITALS — BP 125/73 | HR 85 | Temp 98.3°F | Ht 63.0 in | Wt 192.2 lb

## 2014-12-19 DIAGNOSIS — F32A Depression, unspecified: Secondary | ICD-10-CM

## 2014-12-19 DIAGNOSIS — M25562 Pain in left knee: Secondary | ICD-10-CM | POA: Insufficient documentation

## 2014-12-19 DIAGNOSIS — Z23 Encounter for immunization: Secondary | ICD-10-CM

## 2014-12-19 DIAGNOSIS — F329 Major depressive disorder, single episode, unspecified: Secondary | ICD-10-CM

## 2014-12-19 DIAGNOSIS — E119 Type 2 diabetes mellitus without complications: Secondary | ICD-10-CM

## 2014-12-19 DIAGNOSIS — Z7984 Long term (current) use of oral hypoglycemic drugs: Secondary | ICD-10-CM

## 2014-12-19 DIAGNOSIS — I4891 Unspecified atrial fibrillation: Secondary | ICD-10-CM | POA: Diagnosis not present

## 2014-12-19 DIAGNOSIS — Z7901 Long term (current) use of anticoagulants: Secondary | ICD-10-CM | POA: Diagnosis not present

## 2014-12-19 DIAGNOSIS — I10 Essential (primary) hypertension: Secondary | ICD-10-CM | POA: Diagnosis not present

## 2014-12-19 DIAGNOSIS — K089 Disorder of teeth and supporting structures, unspecified: Secondary | ICD-10-CM

## 2014-12-19 DIAGNOSIS — K08109 Complete loss of teeth, unspecified cause, unspecified class: Secondary | ICD-10-CM

## 2014-12-19 DIAGNOSIS — Z0000 Encounter for general adult medical examination without abnormal findings: Secondary | ICD-10-CM

## 2014-12-19 DIAGNOSIS — E039 Hypothyroidism, unspecified: Secondary | ICD-10-CM

## 2014-12-19 DIAGNOSIS — G8929 Other chronic pain: Secondary | ICD-10-CM

## 2014-12-19 DIAGNOSIS — M549 Dorsalgia, unspecified: Secondary | ICD-10-CM

## 2014-12-19 DIAGNOSIS — E1121 Type 2 diabetes mellitus with diabetic nephropathy: Secondary | ICD-10-CM

## 2014-12-19 HISTORY — DX: Pain in left knee: M25.562

## 2014-12-19 LAB — POCT GLYCOSYLATED HEMOGLOBIN (HGB A1C): Hemoglobin A1C: 6.7

## 2014-12-19 LAB — GLUCOSE, CAPILLARY: Glucose-Capillary: 153 mg/dL — ABNORMAL HIGH (ref 65–99)

## 2014-12-19 MED ORDER — DICLOFENAC SODIUM 1 % TD GEL: 2.0000 g | Freq: Four times a day (QID) | TRANSDERMAL | Status: DC

## 2014-12-19 NOTE — Progress Notes (Signed)
Subjective:    Patient ID: [name], female    DOB: 1950-03-12, 64 y.o.   MRN: [id number]  HPI Comments: [name] is a 64 year old woman with PMH as below here for follow-up of chronic conditions.  Please see problem based charting for status of patient's chronic conditions.     Past Medical History  Diagnosis Date  . Depression   . Skin neoplasm   . Bunion   . Carotid stenosis   . Fibromyalgia   . Internal hemorrhoid   . GERD (gastroesophageal reflux disease)   . Chronic gastritis   . Transaminase or LDH elevation   . Mild cognitive impairment   . Hyperlipidemia   . Fatty liver   . Dysphagia     no documented strictures but responded positively to dilation in past.   . Hypertension   . Cholecystitis     s/p cholecystectomy  . Sarcoidosis ([place])   . Fibromyalgia   . CHF (congestive heart failure) ([place])   . Atrial fibrillation ([place])   . Hypothyroid   . Diverticulosis   . Digoxin toxicity     08/2011  . Bradycardia, drug induced     08/2011 (dig)  . Drug-induced hypotension     08/2011 (Dig)  . Carpal tunnel syndrome     mild   . Diverticulitis     dx 04/08/14  . Family history of adverse reaction to anesthesia     "my mother may have"  . Pneumonia X 2  . OSA (obstructive sleep apnea) dx'd 2003    "can't sleep w/that mask; lost some weight; maybe that's helped" (04/26/2014)  . Diabetic peripheral neuropathy ([place])   . Type II diabetes mellitus ([place])   . H/O hiatal hernia     S/P hernia repair  . Epileptic seizure, tonic ([place])     .No meds since age of 64.  Marland Kitchen TIA (transient ischemic attack)     "don't know when I had it" (04/26/2014)  . Osteoarthritis   . Chronic back pain    Current Outpatient Prescriptions on File Prior to Visit  Medication Sig Dispense Refill  . atenolol (TENORMIN) 25 MG tablet TAKE 1 AND 1/2 TABLETS TWICE DAILY 270 tablet 1  . Blood Glucose Calibration (ACCU-CHEK SMARTVIEW CONTROL) LIQD 1 Device by In Vitro route as needed. Use to  perform control/ calibration on accu-chek meter. diag code E11.9. Non insulin dependent 3 each 0  . Cholecalciferol ([employer]S VITAMIN D3) 1000 UNITS capsule Take 1 capsule (1,000 Units total) by mouth daily. (Patient not taking: Reported on 10/03/2014) 90 capsule 1  . diltiazem (CARTIA XT) 180 MG 24 hr capsule Take 1 capsule (180 mg total) by mouth daily. 90 capsule 1  . famotidine (PEPCID) 20 MG tablet Take 1 tablet (20 mg total) by mouth daily. (Patient not taking: Reported on 10/03/2014) 90 tablet 1  . FLUoxetine (PROZAC) 20 MG capsule Take 1 capsule (20 mg total) by mouth daily. 90 capsule 2  . glipiZIDE (GLUCOTROL) 5 MG tablet Take 0.5 tablets (2.5 mg total) by mouth daily before breakfast. 90 tablet 1  . Lancets 30G MISC DX Code: E11.9 Check blood sugars one time per day. Accucheck fastclix lancets 100 each 11  . levothyroxine (SYNTHROID, LEVOTHROID) 50 MCG tablet TAKE 1 TABLET EVERY MORNING 90 tablet 1  . losartan (COZAAR) 25 MG tablet Take 0.5 tablets (12.5 mg total) by mouth daily. 50 tablet 1  . pravastatin (PRAVACHOL) 40 MG tablet TAKE 1 TABLET DAILY  90 tablet 1  . Probiotic Product (TRUNATURE DIGESTIVE PROBIOTIC PO) Take 1 capsule by mouth daily.    . rivaroxaban (XARELTO) 20 MG TABS tablet Take 1 tablet (20 mg total) by mouth daily with supper. 30 tablet 11   No current facility-administered medications on file prior to visit.    Review of Systems  Constitutional: Negative for fever, chills and appetite change.  Respiratory: Negative for shortness of breath.   Cardiovascular: Positive for palpitations. Negative for chest pain and leg swelling.  Gastrointestinal: Negative for nausea, vomiting, diarrhea and constipation.  Endocrine: Negative for cold intolerance and heat intolerance.  Genitourinary: Negative for difficulty urinating.  Neurological: Negative for syncope and light-headedness.       Filed Vitals:   12/19/14 1424  BP: 125/73  Pulse: 85  Temp: 98.3 F (36.8 C)    TempSrc: Oral  Height: 5\' 3"  (1.6 m)  Weight: 192 lb 3.2 oz (87.181 kg)  SpO2: 99%     Objective:   Physical Exam  Constitutional: She is oriented to person, place, and time. She appears well-developed. No distress.  HENT:  Head: Normocephalic and atraumatic.  Mouth/Throat: Oropharynx is clear and moist. No oropharyngeal exudate.  Eyes: EOM are normal. Pupils are equal, round, and reactive to light.  Neck: Neck supple.  Cardiovascular: Normal rate and normal heart sounds.  Exam reveals no gallop.   No murmur heard. irregular  Pulmonary/Chest: Effort normal. No respiratory distress. She has no wheezes. She has no rales.  Abdominal: Soft. Bowel sounds are normal. She exhibits no distension. There is no tenderness. There is no rebound.  Musculoskeletal: Normal range of motion. She exhibits no edema or tenderness.  Neurological: She is alert and oriented to person, place, and time. No cranial nerve deficit.  Skin: Skin is warm. She is not diaphoretic.  Psychiatric: She has a normal mood and affect. Her behavior is normal. Judgment and thought content normal.  Vitals reviewed.         Assessment & Plan:  Please see problem based charting for A&P.

## 2014-12-19 NOTE — Assessment & Plan Note (Signed)
Symptoms stable on Prozac.  No SI.  Good social support.

## 2014-12-19 NOTE — Assessment & Plan Note (Signed)
Lab Results  Component Value Date   HGBA1C 6.7 12/19/2014   HGBA1C 6.5 10/03/2014   HGBA1C 6.2 04/19/2014     Assessment: Diabetes control:  well controlled Progress toward A1C goal:   at goal Comments: Compliant with glipizide 2.5mg  daily.  No hypoglycemic symptoms.  She lost her CBG meter five months ago and cannot afford a new one.    Plan: Medications:  continue current medications:  Glipizide 2.5mg  daily.   Home glucose monitoring:  daily Frequency:   Timing:   Instruction/counseling given: reminded to bring blood glucose meter & log to each visit Educational resources provided:   Self management tools provided:   Other plans: Will order new meter which should be covered through Medicare.

## 2014-12-19 NOTE — Assessment & Plan Note (Addendum)
Having 3 soft, normal BMs daily since she started probiotic earlier this year (after cdiff infection).  No other hyper or hypo symptoms.  Weight stable. - check TSH

## 2014-12-19 NOTE — Assessment & Plan Note (Signed)
Recently had all teeth removed Aug 2016 and now has dentures.

## 2014-12-19 NOTE — Assessment & Plan Note (Addendum)
X 4 weeks.  No preceding trauma but pain started after kneeling on her couch to plug in an appliance.  No swelling.  Right thumb occasionally painful but no other joint pain/swelling.  Knee exam unremarkable.  Likely OA.  - left knee xray - rx voltaren gel

## 2014-12-19 NOTE — Assessment & Plan Note (Signed)
BP Readings from Last 3 Encounters:  12/19/14 125/73  10/03/14 119/65  08/22/14 116/76    Lab Results  Component Value Date   NA 139 05/24/2014   K 4.5 05/24/2014   CREATININE 0.80 05/24/2014    Assessment: Blood pressure control:  well controlled Progress toward BP goal:   at goal Comments: Compliant with current therapy.    Plan: Medications:  continue current medications:  Atenolol 25mg  daily, diltiazem 180mg  daily, losartan 12.5mg  daily Educational resources provided:   Self management tools provided:   Other plans: RTC in 6 months for follow-up.

## 2014-12-19 NOTE — Patient Instructions (Signed)
1. I will call you with results of xray and blood work.  For the time being, you can try Voltaren gel for knee and back pain.  I will also refer you to Physical Therapy.  Please come back to see me in 3 months or sooner if needed.    2. Please take all medications as prescribed.    3. If you have worsening of your symptoms or new symptoms arise, please call the clinic ([phone]), or go to the ER immediately if symptoms are severe.

## 2014-12-19 NOTE — Assessment & Plan Note (Signed)
On Xarelto and Cartia.

## 2014-12-20 NOTE — Assessment & Plan Note (Signed)
Flu shot administered this visit.

## 2014-12-20 NOTE — Assessment & Plan Note (Signed)
No change in back pain.  No gait dysfunction, loss of B&B control or saddle anesthesia.  She says she has not tried PT. - rx Voltaren gel - refer to PT

## 2014-12-21 ENCOUNTER — Other Ambulatory Visit: Payer: Self-pay | Admitting: Internal Medicine

## 2014-12-21 NOTE — Progress Notes (Signed)
Internal Medicine Clinic Attending  Case discussed with Dr. Wilson soon after the resident saw the patient.  We reviewed the resident's history and exam and pertinent patient test results.  I agree with the assessment, diagnosis, and plan of care documented in the resident's note.  

## 2014-12-24 ENCOUNTER — Other Ambulatory Visit: Payer: Self-pay | Admitting: Internal Medicine

## 2014-12-24 ENCOUNTER — Other Ambulatory Visit: Payer: Self-pay | Admitting: Dietician

## 2014-12-24 DIAGNOSIS — L989 Disorder of the skin and subcutaneous tissue, unspecified: Secondary | ICD-10-CM

## 2014-12-24 DIAGNOSIS — E1121 Type 2 diabetes mellitus with diabetic nephropathy: Secondary | ICD-10-CM

## 2014-12-24 MED ORDER — ACCU-CHEK AVIVA CONNECT W/DEVICE KIT: 1.0000 | PACK | Freq: Every morning | Status: DC

## 2014-12-24 MED ORDER — GLUCOSE BLOOD VI STRP: ORAL_STRIP | Status: DC

## 2014-12-24 MED ORDER — ACCU-CHEK FASTCLIX LANCETS MISC: Status: DC

## 2014-12-24 NOTE — Telephone Encounter (Signed)
Lost her meter and supplies in a recent move. She paid for one meter, her [employer] says they already sent her one, so she cannot get another from them Having dental problems, cannot chew, most foods carbohydrates because they are soft. A1C is good. Wonders how much she has to check her blood sugar. CDE told her with glipizide we useually want her to have a meter accessible. She can discuss more with her doctor.   CDE agreed to leave her a accu chek aviva connect meter with 10 strips and lancets at the front desk for pick up. CDE also agreed to request tesitng supplies for one time a day testing.

## 2014-12-25 ENCOUNTER — Telehealth: Payer: Self-pay | Admitting: *Deleted

## 2014-12-25 NOTE — Telephone Encounter (Signed)
Call to patient to inform her of Dr. Victorino Dike order for her to start PT.  Patient was asked where she would be able to go as she lives in [place] and has transportation issues.  Patient agreed that [facility] would be a good place for her to go as it is close to her home.  Patient stated that she would be unable to go if there is a co-pay.  Patient was informed that she will need to ask her insurance carrier.  Referral was placed in the workqueue for PT.  Sander Nephew, RN 12/25/2014 8:55 AM

## 2014-12-28 ENCOUNTER — Other Ambulatory Visit: Payer: Self-pay | Admitting: Internal Medicine

## 2014-12-28 DIAGNOSIS — E039 Hypothyroidism, unspecified: Secondary | ICD-10-CM

## 2014-12-28 DIAGNOSIS — E785 Hyperlipidemia, unspecified: Secondary | ICD-10-CM

## 2014-12-28 NOTE — Telephone Encounter (Signed)
Pt requesting levothyroxine and pravastatin to be filled @ [facility].

## 2014-12-31 MED ORDER — PRAVASTATIN SODIUM 40 MG PO TABS: 40.0000 mg | ORAL_TABLET | Freq: Every day | ORAL | Status: DC

## 2014-12-31 MED ORDER — LEVOTHYROXINE SODIUM 50 MCG PO TABS: 50.0000 ug | ORAL_TABLET | Freq: Every morning | ORAL | Status: DC

## 2015-01-03 ENCOUNTER — Ambulatory Visit (HOSPITAL_COMMUNITY): Payer: Commercial Managed Care - HMO | Admitting: Physical Therapy

## 2015-01-15 ENCOUNTER — Telehealth: Payer: Self-pay | Admitting: *Deleted

## 2015-01-15 NOTE — Telephone Encounter (Signed)
Pt calls and states this month her xarelto will be $80.00 for one month, evidently she has hit her donut hole and needs appr 2 months worth until her new social security insurance starts in jan 2017, can you all help her? She has enough for 3 days Sending to dr groce and dr Maudie Mercury

## 2015-01-16 ENCOUNTER — Encounter: Payer: Self-pay | Admitting: Pharmacist

## 2015-01-16 ENCOUNTER — Ambulatory Visit (INDEPENDENT_AMBULATORY_CARE_PROVIDER_SITE_OTHER): Payer: Commercial Managed Care - HMO | Admitting: Pharmacist

## 2015-01-16 DIAGNOSIS — Z79899 Other long term (current) drug therapy: Secondary | ICD-10-CM

## 2015-01-16 DIAGNOSIS — Z7901 Long term (current) use of anticoagulants: Secondary | ICD-10-CM | POA: Diagnosis not present

## 2015-01-16 DIAGNOSIS — I4891 Unspecified atrial fibrillation: Secondary | ICD-10-CM

## 2015-01-16 DIAGNOSIS — Z719 Counseling, unspecified: Secondary | ICD-10-CM

## 2015-01-16 NOTE — Telephone Encounter (Signed)
We are good to go. Patient will pick up 30-days for $80 and I will also give samples to get her through the year.  Thank you!

## 2015-01-16 NOTE — Patient Instructions (Signed)
Tylenol (acetaminophen) Safe Dosing over-the-counter  Extended-release: 650 to 1300 mg every 8 hours  OR 650 mg every 4 to 6 hours   Maximum daily dose: 3000 mg daily

## 2015-01-16 NOTE — Progress Notes (Signed)
[name] is a 64 y.o. female who reports to the clinic assistance with  rivaroxaban (Xarelto) therapy.    ASSESSMENT Indication(s): atrial fibrillation Duration: indefinite  Labs:    Component Value Date/Time   AST 27 04/26/2014 1654   ALT 18 04/26/2014 1654   NA 139 05/24/2014 1608   K 4.5 05/24/2014 1608   CL 104 05/24/2014 1608   CO2 27 05/24/2014 1608   GLUCOSE 80 05/24/2014 1608   HGBA1C 6.7 12/19/2014 1454   HGBA1C 6.3* 07/29/2011 1422   BUN 12 05/24/2014 1608   CREATININE 0.80 05/24/2014 1608   CREATININE 0.75 04/27/2014 0630   CALCIUM 10.3 05/24/2014 1608   GFRNONAA 78 05/24/2014 1608   GFRNONAA 87* 04/27/2014 0630   WBC 6.5 04/26/2014 1654   HGB 13.0 04/26/2014 1654   HCT 36.6 04/27/2014 0730   HCT 39.7 04/26/2014 1654   PLT 307 04/26/2014 1654   rivaroxaban (Xarelto) Dose: 20 mg daily  Adherence: Patient needs cost assistance. She reports her copay has increased to $80 and has a new insurance which will bring the price down in January 2017, but is unable to afford the current copay.  Safety: Patient reports no recent signs or symptoms of bleeding, no signs of symptoms of thromboembolism. Medication changes: no.  RECOMMENDATIONS  Medication: Patient agreed to pay $80 copay for 1 month and I will also provide samples to support patient through end of the year.  Monitoring: CMP, CBC recommended at follow up with PCP for anticoagulation safety.  Patient also inquired about options for pain. Patient states she has tried acetaminophen in the past, only 1 tablet of the extra strength. Patient is interested in trying acetaminophen again with other dosing recommendations. Provided education on safe use of acetaminophen.  PLAN Medication Samples have been provided to the patient.  Drug: Xarelto (rivaroxaban) Strength: 20 mg Qty: 35 LOT: [id number] Exp.Date: 04/19  The patient has been instructed regarding the correct time, dose, and frequency of taking this  medication, including desired effects and most common side effects.   , J 2:25 PM 01/16/2015  Patient Instructions: Patient Instructions  Tylenol (acetaminophen) Safe Dosing over-the-counter  Extended-release: 650 to 1300 mg every 8 hours  OR 650 mg every 4 to 6 hours   Maximum daily dose: 3000 mg daily  Follow-up No Follow-up on file.  [facility] Pharmacist 01/16/2015, 2:26 PM  30 minutes spent face-to-face with the patient during the encounter. 75% of time spent on education. 25% of time was spent on assessment and plan

## 2015-01-21 ENCOUNTER — Encounter: Payer: Self-pay | Admitting: *Deleted

## 2015-01-22 ENCOUNTER — Encounter: Payer: Self-pay | Admitting: Licensed Clinical Social Worker

## 2015-01-22 NOTE — Progress Notes (Signed)
[name] presents as a walk-in to [facility] on 01/21/2015.  [name] voices complaints of increased headaches, difficulty sleeping and neck pain.  Pt attributes this to increased anxiety of her current living situation and financial difficulties.  Pt declined to be seen by triage RN today and meet with CSW only. [name] currently lives in income based housing.  Pt recently notified her rent will be increasing within the next month or two.  [name] states she would be unable to afford the new monthly rent and is in a lease agreement until May 2017.  Apartment contract will allow [name] to break her current lease agreement without penalty with a letter from her physician indicating the rate increase has had a negative impact on pt's well-being.  [name] states she is in desperate need to obtain her security deposit back, as she would need this money to go towards moving to her new housing situation.  [name] is eager to move in with a friend she has no since childhood, as this would help ease her financial burden. CSW discussed mental health options with  [name].  However, pt would be unable to schedule with a counselor until January 2017 as her current specialty copayment she can not afford.  CSW offered pt to see triage RN regarding her complaints of increased headaches, difficulty sleeping and neck pain.  [name] declines stating she does not want any medication to help her sleep and is hopeful symptoms will decrease once her housing situation is final.  Pt aware her current PCP will be in the office on 01/23/15 and CSW will forward this note to PCP requesting written letter to assist pt with moving to a more affordable living situation and obtaining her apartment security deposit back.  Pt aware decision will be made by PCP.

## 2015-01-23 ENCOUNTER — Telehealth: Payer: Self-pay | Admitting: Licensed Clinical Social Worker

## 2015-01-23 NOTE — Telephone Encounter (Signed)
[name] placed call to CSW, message left inquiring if response had been recv'd from PCP regarding pt's letter request.  CSW informed [name], Dr. Redmond Pulling will not be in until afternoon and will touch base with physician.   After brief discussion with Dr. Redmond Pulling, pt notified PCP will need to review pt's chart to make determination as pt is currently not linked with a mental health provider.  Pt's informed this worker "I can't afford this increase and I have been crying all week.  I need this letter."  CSW informed [name] hopeful to have a determination by the end of the week.  Pt thanked Insurance underwriter and aware this worker will contact her once PCP has made determination regarding letter request.

## 2015-01-24 NOTE — Telephone Encounter (Signed)
[name] placed call to [facility] inquiring if response has been received from PCP.  CSW informed [name] no response or letter or chart at this time.  CSW will notify pt once response has been obtained.

## 2015-01-29 ENCOUNTER — Telehealth: Payer: Self-pay | Admitting: Cardiology

## 2015-01-29 NOTE — Telephone Encounter (Signed)
OK to write this letter.

## 2015-01-29 NOTE — Telephone Encounter (Signed)
Spoke with pt, she is having a lot of anxiety because her rent is going up. She feels her atrial fib more because of the anxiety. She is asking for a letter from dr hochrein so she can get her deposit back when she moves. She has asked her medical doctor for the note and they said they would do it for her but they have not done so. She reports she has to have a note by the end of the month and she can not wait on them anymore. She request a letter stating her anxiety from her rent going up is causing her to move. Will forward to dr hochrein for his approval.

## 2015-01-29 NOTE — Telephone Encounter (Signed)
New Message  Pt calling to speak w/ RN about receiving a letter from Dr Percival Spanish- detailing her anxiety and other AF related issues. Please call back and discus.

## 2015-01-30 ENCOUNTER — Encounter: Payer: Self-pay | Admitting: *Deleted

## 2015-01-30 NOTE — Telephone Encounter (Signed)
Returning your call. °

## 2015-01-30 NOTE — Telephone Encounter (Signed)
Pt called back in requesting that a copy of the letter be made for her personal records.   Thanks

## 2015-01-30 NOTE — Telephone Encounter (Signed)
Left message for pt to call.

## 2015-01-30 NOTE — Telephone Encounter (Signed)
Pt notified and aware to contact [facility] on 02/04/15 if still in need of letter.

## 2015-01-30 NOTE — Telephone Encounter (Signed)
Letter generated and placed at the front desk for pick up. Copy made.

## 2015-02-05 ENCOUNTER — Telehealth: Payer: Self-pay | Admitting: *Deleted

## 2015-02-05 NOTE — Telephone Encounter (Signed)
PATIENT WAS CONTACTED WITH HER APPOINTMENT WITH DR LUPTON ON DEC. 7.016. Marland Kitchen  PATIENT MADE NOTE OF THIS APPOINTMENT.

## 2015-02-20 ENCOUNTER — Encounter: Payer: Self-pay | Admitting: Cardiology

## 2015-02-20 ENCOUNTER — Encounter: Payer: Self-pay | Admitting: Internal Medicine

## 2015-04-02 NOTE — Addendum Note (Signed)
Addended by: Hulan Fray on: 04/02/2015 08:35 PM   Modules accepted: Orders

## 2015-04-03 ENCOUNTER — Encounter: Payer: Self-pay | Admitting: Internal Medicine

## 2015-04-03 ENCOUNTER — Ambulatory Visit (INDEPENDENT_AMBULATORY_CARE_PROVIDER_SITE_OTHER): Payer: PPO | Admitting: Internal Medicine

## 2015-04-03 VITALS — BP 134/69 | HR 71 | Temp 98.3°F | Ht 63.0 in | Wt 195.1 lb

## 2015-04-03 DIAGNOSIS — F32A Depression, unspecified: Secondary | ICD-10-CM

## 2015-04-03 DIAGNOSIS — E119 Type 2 diabetes mellitus without complications: Secondary | ICD-10-CM

## 2015-04-03 DIAGNOSIS — R197 Diarrhea, unspecified: Secondary | ICD-10-CM | POA: Diagnosis not present

## 2015-04-03 DIAGNOSIS — I10 Essential (primary) hypertension: Secondary | ICD-10-CM | POA: Diagnosis not present

## 2015-04-03 DIAGNOSIS — E039 Hypothyroidism, unspecified: Secondary | ICD-10-CM | POA: Diagnosis not present

## 2015-04-03 DIAGNOSIS — G8929 Other chronic pain: Secondary | ICD-10-CM

## 2015-04-03 DIAGNOSIS — Z7901 Long term (current) use of anticoagulants: Secondary | ICD-10-CM

## 2015-04-03 DIAGNOSIS — I4891 Unspecified atrial fibrillation: Secondary | ICD-10-CM | POA: Diagnosis not present

## 2015-04-03 DIAGNOSIS — M8580 Other specified disorders of bone density and structure, unspecified site: Secondary | ICD-10-CM

## 2015-04-03 DIAGNOSIS — K219 Gastro-esophageal reflux disease without esophagitis: Secondary | ICD-10-CM

## 2015-04-03 DIAGNOSIS — E785 Hyperlipidemia, unspecified: Secondary | ICD-10-CM

## 2015-04-03 DIAGNOSIS — Z7984 Long term (current) use of oral hypoglycemic drugs: Secondary | ICD-10-CM

## 2015-04-03 DIAGNOSIS — F329 Major depressive disorder, single episode, unspecified: Secondary | ICD-10-CM

## 2015-04-03 DIAGNOSIS — M549 Dorsalgia, unspecified: Secondary | ICD-10-CM

## 2015-04-03 LAB — GLUCOSE, CAPILLARY: Glucose-Capillary: 97 mg/dL (ref 65–99)

## 2015-04-03 LAB — POCT GLYCOSYLATED HEMOGLOBIN (HGB A1C): Hemoglobin A1C: 6.6

## 2015-04-03 MED ORDER — ROSUVASTATIN CALCIUM 10 MG PO TABS: 20.0000 mg | ORAL_TABLET | Freq: Every day | ORAL | Status: DC

## 2015-04-03 MED ORDER — PRAVASTATIN SODIUM 40 MG PO TABS: 40.0000 mg | ORAL_TABLET | Freq: Every evening | ORAL | Status: DC

## 2015-04-03 NOTE — Progress Notes (Signed)
Subjective:    Patient ID: [name], female    DOB: 1951-03-09, 65 y.o.   MRN: [id number]  HPI Comments: [name] is a 65 year old woman with PMH as below here for follow-up of HTN.  Please see problem based charting for status of her chronic conditions.    Past Medical History  Diagnosis Date  . Depression   . Skin neoplasm   . Bunion   . Carotid stenosis   . Fibromyalgia   . Internal hemorrhoid   . GERD (gastroesophageal reflux disease)   . Chronic gastritis   . Transaminase or LDH elevation   . Mild cognitive impairment   . Hyperlipidemia   . Fatty liver   . Dysphagia     no documented strictures but responded positively to dilation in past.   . Hypertension   . Cholecystitis     s/p cholecystectomy  . Sarcoidosis ([place])   . Fibromyalgia   . CHF (congestive heart failure) ([place])   . Atrial fibrillation ([place])   . Hypothyroid   . Diverticulosis   . Digoxin toxicity     08/2011  . Bradycardia, drug induced     08/2011 (dig)  . Drug-induced hypotension     08/2011 (Dig)  . Carpal tunnel syndrome     mild   . Diverticulitis     dx 04/08/14  . Family history of adverse reaction to anesthesia     "my mother may have"  . Pneumonia X 2  . OSA (obstructive sleep apnea) dx'd 2003    "can't sleep w/that mask; lost some weight; maybe that's helped" (04/26/2014)  . Diabetic peripheral neuropathy ([place])   . Type II diabetes mellitus ([place])   . H/O hiatal hernia     S/P hernia repair  . Epileptic seizure, tonic ([place])     .No meds since age of 65.  Marland Kitchen TIA (transient ischemic attack)     "don't know when I had it" (04/26/2014)  . Osteoarthritis   . Chronic back pain    Current Outpatient Prescriptions on File Prior to Visit  Medication Sig Dispense Refill  . ACCU-CHEK FASTCLIX LANCETS MISC Check blood sugar one time a day 102 each 4  . atenolol (TENORMIN) 25 MG tablet TAKE 1 AND 1/2 TABLETS TWICE DAILY 270 tablet 1  . Blood Glucose Calibration (ACCU-CHEK SMARTVIEW  CONTROL) LIQD 1 Device by In Vitro route as needed. Use to perform control/ calibration on accu-chek meter. diag code E11.9. Non insulin dependent 3 each 0  . Blood Glucose Monitoring Suppl (ACCU-CHEK AVIVA CONNECT) W/DEVICE KIT 1 each by Does not apply route every morning. 1 kit 0  . Cholecalciferol ([employer] VITAMIN D3) 1000 UNITS capsule Take 1 capsule (1,000 Units total) by mouth daily. 90 capsule 1  . diclofenac sodium (VOLTAREN) 1 % GEL Apply 2 g topically 4 (four) times daily. 100 g 0  . diltiazem (CARTIA XT) 180 MG 24 hr capsule Take 1 capsule (180 mg total) by mouth daily. 90 capsule 1  . famotidine (PEPCID) 20 MG tablet Take 1 tablet (20 mg total) by mouth daily. 90 tablet 1  . FLUoxetine (PROZAC) 20 MG capsule Take 1 capsule (20 mg total) by mouth daily. 90 capsule 2  . glipiZIDE (GLUCOTROL) 5 MG tablet Take 0.5 tablets (2.5 mg total) by mouth daily before breakfast. 90 tablet 1  . glucose blood (ACCU-CHEK AVIVA) test strip Check blood sugar one time a day 100 each 4  . levothyroxine (SYNTHROID, LEVOTHROID)  50 MCG tablet Take 1 tablet (50 mcg total) by mouth every morning. 90 tablet 3  . losartan (COZAAR) 25 MG tablet Take 0.5 tablets (12.5 mg total) by mouth daily. 50 tablet 1  . pravastatin (PRAVACHOL) 40 MG tablet Take 1 tablet (40 mg total) by mouth daily. 90 tablet 3  . Probiotic Product (TRUNATURE DIGESTIVE PROBIOTIC PO) Take 1 capsule by mouth daily.    . rivaroxaban (XARELTO) 20 MG TABS tablet Take 1 tablet (20 mg total) by mouth daily with supper. 30 tablet 11   No current facility-administered medications on file prior to visit.    Review of Systems  Constitutional: Negative for fever, chills, appetite change and unexpected weight change.  Gastrointestinal: Positive for diarrhea. Negative for nausea, vomiting, abdominal pain, constipation and blood in stool.       10-12 watery BMs per day four about 3 months.  Diarrhea usually after meals.  Endocrine: Positive for heat  intolerance and polydipsia. Negative for cold intolerance.  Genitourinary: Negative for dysuria, frequency and difficulty urinating.       Filed Vitals:   04/03/15 1429  BP: 134/69  Pulse: 71  Temp: 98.3 F (36.8 C)  TempSrc: Oral  Height: 5' 3"  (1.6 m)  Weight: 195 lb 1.6 oz (88.497 kg)  SpO2: 99%     Objective:   Physical Exam  Constitutional: She is oriented to person, place, and time. She appears well-developed. No distress.  HENT:  Head: Normocephalic and atraumatic.  Mouth/Throat: Oropharynx is clear and moist. No oropharyngeal exudate.  Eyes: Conjunctivae and EOM are normal. Pupils are equal, round, and reactive to light. Right eye exhibits no discharge. Left eye exhibits no discharge. No scleral icterus.  Neck: Neck supple.  Cardiovascular: Normal rate, regular rhythm and normal heart sounds.  Exam reveals no gallop and no friction rub.   No murmur heard. Pulmonary/Chest: Effort normal and breath sounds normal. No respiratory distress. She has no wheezes. She has no rales.  Abdominal: Soft. Bowel sounds are normal. She exhibits no distension and no mass. There is no tenderness. There is no rebound and no guarding.  Musculoskeletal: Normal range of motion. She exhibits tenderness. She exhibits no edema.  Thoracic paraspinal musculature mild TTP.  No central spinal TTP.  Neurological: She is alert and oriented to person, place, and time. No cranial nerve deficit.  Skin: Skin is warm. She is not diaphoretic.  Psychiatric: She has a normal mood and affect. Her behavior is normal. Judgment and thought content normal.  Vitals reviewed.         Assessment & Plan:  Please see problem based charting for A&P.

## 2015-04-03 NOTE — Patient Instructions (Addendum)
1. I have sent in a prescription for Crestor to your pharmacy.  This med will replace your pravastatin.  Continue other meds as prescribed.  I will send in 90 days supplies like you requested.  Please try ibuprofen 200-400mg  TID with food if needed for your back pain.  You can also continue Tylenol and voltaren gel.  Try heat as well.   2. Please take all medications as prescribed.    3. If you have worsening of your symptoms or new symptoms arise, please call the clinic [phone]), or go to the ER immediately if symptoms are severe.  Please come back to see me in June or sooner if you have problems.

## 2015-04-04 ENCOUNTER — Telehealth: Payer: Self-pay | Admitting: Internal Medicine

## 2015-04-04 DIAGNOSIS — E039 Hypothyroidism, unspecified: Secondary | ICD-10-CM

## 2015-04-04 DIAGNOSIS — I10 Essential (primary) hypertension: Secondary | ICD-10-CM

## 2015-04-04 DIAGNOSIS — F32A Depression, unspecified: Secondary | ICD-10-CM

## 2015-04-04 DIAGNOSIS — M25562 Pain in left knee: Secondary | ICD-10-CM

## 2015-04-04 DIAGNOSIS — K219 Gastro-esophageal reflux disease without esophagitis: Secondary | ICD-10-CM

## 2015-04-04 DIAGNOSIS — E785 Hyperlipidemia, unspecified: Secondary | ICD-10-CM

## 2015-04-04 DIAGNOSIS — E1121 Type 2 diabetes mellitus with diabetic nephropathy: Secondary | ICD-10-CM

## 2015-04-04 DIAGNOSIS — F329 Major depressive disorder, single episode, unspecified: Secondary | ICD-10-CM

## 2015-04-04 LAB — CBC WITH DIFFERENTIAL/PLATELET
BASOS ABS: 0 10*3/uL (ref 0.0–0.2)
Basos: 0 %
EOS (ABSOLUTE): 0.1 10*3/uL (ref 0.0–0.4)
Eos: 2 %
Hematocrit: 41.5 % (ref 34.0–46.6)
Hemoglobin: 14.4 g/dL (ref 11.1–15.9)
Immature Grans (Abs): 0 10*3/uL (ref 0.0–0.1)
Immature Granulocytes: 0 %
LYMPHS: 41 %
Lymphocytes Absolute: 2.8 10*3/uL (ref 0.7–3.1)
MCH: 32.1 pg (ref 26.6–33.0)
MCHC: 34.7 g/dL (ref 31.5–35.7)
MCV: 92 fL (ref 79–97)
Monocytes Absolute: 0.6 10*3/uL (ref 0.1–0.9)
Monocytes: 8 %
NEUTROS ABS: 3.3 10*3/uL (ref 1.4–7.0)
Neutrophils: 49 %
Platelets: 225 10*3/uL (ref 150–379)
RBC: 4.49 x10E6/uL (ref 3.77–5.28)
RDW: 13.3 % (ref 12.3–15.4)
WBC: 6.8 10*3/uL (ref 3.4–10.8)

## 2015-04-04 LAB — CMP14 + ANION GAP
A/G RATIO: 1.8 (ref 1.1–2.5)
ALT: 24 IU/L (ref 0–32)
AST: 24 IU/L (ref 0–40)
Albumin: 4.2 g/dL (ref 3.6–4.8)
Alkaline Phosphatase: 78 IU/L (ref 39–117)
Anion Gap: 16 mmol/L (ref 10.0–18.0)
BILIRUBIN TOTAL: 0.9 mg/dL (ref 0.0–1.2)
BUN/Creatinine Ratio: 17 (ref 11–26)
BUN: 13 mg/dL (ref 8–27)
CO2: 22 mmol/L (ref 18–29)
Calcium: 9.7 mg/dL (ref 8.7–10.3)
Chloride: 103 mmol/L (ref 96–106)
Creatinine, Ser: 0.76 mg/dL (ref 0.57–1.00)
GFR calc non Af Amer: 82 mL/min/{1.73_m2} (ref >59)
GFR, EST AFRICAN AMERICAN: 95 mL/min/{1.73_m2} (ref >59)
Globulin, Total: 2.4 g/dL (ref 1.5–4.5)
Glucose: 86 mg/dL (ref 65–99)
POTASSIUM: 4.6 mmol/L (ref 3.5–5.2)
Sodium: 141 mmol/L (ref 134–144)
Total Protein: 6.6 g/dL (ref 6.0–8.5)

## 2015-04-04 LAB — TSH: TSH: 0.967 u[IU]/mL (ref 0.450–4.500)

## 2015-04-04 MED ORDER — FAMOTIDINE 20 MG PO TABS: 20.0000 mg | ORAL_TABLET | Freq: Every day | ORAL | Status: DC

## 2015-04-04 MED ORDER — DILTIAZEM HCL ER COATED BEADS 180 MG PO CP24: 180.0000 mg | ORAL_CAPSULE | Freq: Every day | ORAL | Status: DC

## 2015-04-04 MED ORDER — ATENOLOL 25 MG PO TABS: ORAL_TABLET | ORAL | Status: DC

## 2015-04-04 MED ORDER — RIVAROXABAN 20 MG PO TABS: 20.0000 mg | ORAL_TABLET | Freq: Every day | ORAL | Status: DC

## 2015-04-04 MED ORDER — GLIPIZIDE 5 MG PO TABS: 2.5000 mg | ORAL_TABLET | Freq: Every day | ORAL | Status: DC

## 2015-04-04 MED ORDER — CHOLECALCIFEROL 25 MCG (1000 UT) PO CAPS: 1000.0000 [IU] | ORAL_CAPSULE | Freq: Every day | ORAL | Status: DC

## 2015-04-04 MED ORDER — LEVOTHYROXINE SODIUM 50 MCG PO TABS: 50.0000 ug | ORAL_TABLET | Freq: Every morning | ORAL | Status: DC

## 2015-04-04 NOTE — Telephone Encounter (Signed)
Patient was seen yesterday and was supposed to have at least 10 medications sent to pharmacy for refills. But only one was called. But the insurance will not approve that one because it has a residents name on it. Pt is about to run out of the medications

## 2015-04-04 NOTE — Telephone Encounter (Signed)
I sent in 90 day refills to patient's pharmacy today.

## 2015-04-05 DIAGNOSIS — R197 Diarrhea, unspecified: Secondary | ICD-10-CM | POA: Insufficient documentation

## 2015-04-05 HISTORY — DX: Diarrhea, unspecified: R19.7

## 2015-04-05 NOTE — Assessment & Plan Note (Signed)
BP Readings from Last 3 Encounters:  04/03/15 134/69  12/19/14 125/73  10/03/14 119/65    Lab Results  Component Value Date   NA 141 04/03/2015   K 4.6 04/03/2015   CREATININE 0.76 04/03/2015    Assessment: Blood pressure control:  well controlled Progress toward BP goal:   at gooal Comments: Reports compliance with atenolol 25mg  daily, diltiazem 180mg  daily, losartan 25mg  daily.  Plan: Medications:  continue current medications: atenolol 25mg  daily, diltiazem 180mg  daily, losartan 25mg  daily. Educational resources provided: brochure (denied) Self management tools provided:   Other plans: BMP today.  Rx sent for 90 prescriptions per patient request.  Follow-up BP next visit.

## 2015-04-05 NOTE — Assessment & Plan Note (Signed)
Assessment:  No change in chronic low back pain.  No red flag signs.  Insurance would not pay for PT.  Some relief with voltaren, Tylenol. Plan:  I advised she continue conservative measures, Voltaren, tylenol.  Try adding ibuprofen with food up to TID prn.

## 2015-04-05 NOTE — Assessment & Plan Note (Addendum)
Assessment:  She reports compliance with vit D but I do not see calcium on med list.  Last DEXA in 2010. Plan:  Continue vit D.  Need to add calcium.  Referral placed for DEXA.

## 2015-04-05 NOTE — Assessment & Plan Note (Signed)
Assessment:  ASCVD risk is 14% so will d/c Pravastatin and start higher potency statin, Crestor.   Plan:  Start with mod intensity Crestor 10mg  and attempt to increase if no ADRs.

## 2015-04-05 NOTE — Assessment & Plan Note (Addendum)
Assessment:  Compliant with Xarelto.  She does have to pay ~ $40 per month with current insurance.  But it will be a better deal to send in a 90 day rx. Plan:  Continue Xarelto.  Rx sent for 90 day supply per her request.  I will check with pharmacist to see if there is any way to get Xarelto for less.

## 2015-04-05 NOTE — Assessment & Plan Note (Addendum)
Assessment:  She reports 3 month hx of watery diarrhea, 10-12x daily occuring after meals usually.  No blood or mucous in stool.  She has hx of cdiff but no recent abx use, she has no fever and this has been present for 3 months.  She is able to maintain hydration, eating normally,no other GI symptoms and weight is actually up a few pounds in the past year so I doubt infectious etiology.  Blood sugars are well controlled so unlikely related to DM. Plan:  Check CMP, CBC w/diff, TSH.  If neg, will need to get stool studies, including cdiff.

## 2015-04-05 NOTE — Assessment & Plan Note (Signed)
Lab Results  Component Value Date   HGBA1C 6.6 04/03/2015   HGBA1C 6.7 12/19/2014   HGBA1C 6.5 10/03/2014     Assessment: Diabetes control:  well controlled Progress toward A1C goal:   at goal Comments: She reports compliance with glipizide 2.5mg  daily with breakfast.  She has not felt hypoglycemic.  Not checking CBGs because she does not have a meter (I sent a replacement meter but she says insurance would not cover).  Plan: Medications:  continue current medications:  Glipizide 2.5mg  daily with breakfast. Home glucose monitoring:  Ideally, daily, however she has had stable A1c for quite some time on low dose and no hypoglycemic episodes so may be able to skip. Frequency:   Timing:   Instruction/counseling given: reminded to bring blood glucose meter & log to each visit Educational resources provided: brochure (demied) Self management tools provided:   Other plans: follow-up in June 2017.

## 2015-04-05 NOTE — Assessment & Plan Note (Signed)
Assessment:  She reports heat intolerance and daily diarrhea for past few months. Plan:  Check TSH.

## 2015-04-08 NOTE — Progress Notes (Signed)
Case discussed with Dr. Wilson soon after the resident saw the patient. We reviewed the resident's history and exam and pertinent patient test results. I agree with the assessment, diagnosis, and plan of care documented in the resident's note. 

## 2015-04-10 ENCOUNTER — Other Ambulatory Visit: Payer: Self-pay | Admitting: Internal Medicine

## 2015-04-10 DIAGNOSIS — F32A Depression, unspecified: Secondary | ICD-10-CM

## 2015-04-10 DIAGNOSIS — F329 Major depressive disorder, single episode, unspecified: Secondary | ICD-10-CM

## 2015-04-10 DIAGNOSIS — I10 Essential (primary) hypertension: Secondary | ICD-10-CM

## 2015-04-10 NOTE — Telephone Encounter (Signed)
Pt requesting losartan and fluoxetine to be filled @ the drug store. Pt want three month supply.

## 2015-04-11 MED ORDER — FLUOXETINE HCL 20 MG PO CAPS: 20.0000 mg | ORAL_CAPSULE | Freq: Every day | ORAL | Status: DC

## 2015-04-11 MED ORDER — LOSARTAN POTASSIUM 25 MG PO TABS: 12.5000 mg | ORAL_TABLET | Freq: Every day | ORAL | Status: DC

## 2015-04-17 ENCOUNTER — Other Ambulatory Visit: Payer: Self-pay | Admitting: Internal Medicine

## 2015-04-17 ENCOUNTER — Ambulatory Visit: Payer: PPO | Admitting: Pharmacist

## 2015-04-17 DIAGNOSIS — K529 Noninfective gastroenteritis and colitis, unspecified: Secondary | ICD-10-CM

## 2015-04-17 DIAGNOSIS — Z719 Counseling, unspecified: Secondary | ICD-10-CM

## 2015-04-17 DIAGNOSIS — Z79899 Other long term (current) drug therapy: Secondary | ICD-10-CM

## 2015-04-17 NOTE — Patient Instructions (Addendum)
Food Choices for Gastroesophageal Reflux Disease, Adult When you have gastroesophageal reflux disease (GERD), the foods you eat and your eating habits are very important. Choosing the right foods can help ease the discomfort of GERD. WHAT GENERAL GUIDELINES DO I NEED TO FOLLOW?  Choose fruits, vegetables, whole grains, low-fat dairy products, and low-fat meat, fish, and poultry.  Limit fats such as oils, salad dressings, butter, nuts, and avocado.  Keep a food diary to identify foods that cause symptoms.  Avoid foods that cause reflux. These may be different for different people.  Eat frequent small meals instead of three large meals each day.  Eat your meals slowly, in a relaxed setting.  Limit fried foods.  Cook foods using methods other than frying.  Avoid drinking alcohol.  Avoid drinking large amounts of liquids with your meals.  Avoid bending over or lying down until 2-3 hours after eating. WHAT FOODS ARE NOT RECOMMENDED? The following are some foods and drinks that may worsen your symptoms: Vegetables Tomatoes. Tomato juice. Tomato and spaghetti sauce. Chili peppers. Onion and garlic. Horseradish. Fruits Oranges, grapefruit, and lemon (fruit and juice). Meats High-fat meats, fish, and poultry. This includes hot dogs, ribs, ham, sausage, salami, and bacon. Dairy Whole milk and chocolate milk. Sour cream. Cream. Butter. Ice cream. Cream cheese.  Beverages Coffee and tea, with or without caffeine. Carbonated beverages or energy drinks. Condiments Hot sauce. Barbecue sauce.  Sweets/Desserts Chocolate and cocoa. Donuts. Peppermint and spearmint. Fats and Oils High-fat foods, including [place] fries and potato chips. Other Vinegar. Strong spices, such as black pepper, white pepper, red pepper, cayenne, curry powder, cloves, ginger, and chili powder. The items listed above may not be a complete list of foods and beverages to avoid. Contact your dietitian for more  information.   This information is not intended to replace advice given to you by your health care provider. Make sure you discuss any questions you have with your health care provider.   Document Released: 02/23/2005 Document Revised: 03/16/2014 Document Reviewed: 12/28/2012 Elsevier Interactive Patient Education 2016 [employer].  Diabetes Mellitus and Food It is important for you to manage your blood sugar (glucose) level. Your blood glucose level can be greatly affected by what you eat. Eating healthier foods in the appropriate amounts throughout the day at about the same time each day will help you control your blood glucose level. It can also help slow or prevent worsening of your diabetes mellitus. Healthy eating may even help you improve the level of your blood pressure and reach or maintain a healthy weight.  General recommendations for healthful eating and cooking habits include:  Eating meals and snacks regularly. Avoid going long periods of time without eating to lose weight.  Eating a diet that consists mainly of plant-based foods, such as fruits, vegetables, nuts, legumes, and whole grains.  Using low-heat cooking methods, such as baking, instead of high-heat cooking methods, such as deep frying. Work with your dietitian to make sure you understand how to use the Nutrition Facts information on food labels. HOW CAN FOOD AFFECT ME? Carbohydrates Carbohydrates affect your blood glucose level more than any other type of food. Your dietitian will help you determine how many carbohydrates to eat at each meal and teach you how to count carbohydrates. Counting carbohydrates is important to keep your blood glucose at a healthy level, especially if you are using insulin or taking certain medicines for diabetes mellitus. Alcohol Alcohol can cause sudden decreases in blood glucose (hypoglycemia), especially if  you use insulin or take certain medicines for diabetes mellitus. Hypoglycemia can  be a life-threatening condition. Symptoms of hypoglycemia (sleepiness, dizziness, and disorientation) are similar to symptoms of having too much alcohol.  If your health care provider has given you approval to drink alcohol, do so in moderation and use the following guidelines:  Women should not have more than one drink per day, and men should not have more than two drinks per day. One drink is equal to:  12 oz of beer.  5 oz of wine.  1 oz of hard liquor.  Do not drink on an empty stomach.  Keep yourself hydrated. Have water, diet soda, or unsweetened iced tea.  Regular soda, juice, and other mixers might contain a lot of carbohydrates and should be counted. WHAT FOODS ARE NOT RECOMMENDED? As you make food choices, it is important to remember that all foods are not the same. Some foods have fewer nutrients per serving than other foods, even though they might have the same number of calories or carbohydrates. It is difficult to get your body what it needs when you eat foods with fewer nutrients. Examples of foods that you should avoid that are high in calories and carbohydrates but low in nutrients include:  Trans fats (most processed foods list trans fats on the Nutrition Facts label).  Regular soda.  Juice.  Candy.  Sweets, such as cake, pie, doughnuts, and cookies.  Fried foods. WHAT FOODS CAN I EAT? Eat nutrient-rich foods, which will nourish your body and keep you healthy. The food you should eat also will depend on several factors, including:  The calories you need.  The medicines you take.  Your weight.  Your blood glucose level.  Your blood pressure level.  Your cholesterol level. You should eat a variety of foods, including:  Protein.  Lean cuts of meat.  Proteins low in saturated fats, such as fish, egg whites, and beans. Avoid processed meats.  Fruits and vegetables.  Fruits and vegetables that may help control blood glucose levels, such as apples,  mangoes, and yams.  Dairy products.  Choose fat-free or low-fat dairy products, such as milk, yogurt, and cheese.  Grains, bread, pasta, and rice.  Choose whole grain products, such as multigrain bread, whole oats, and brown rice. These foods may help control blood pressure.  Fats.  Foods containing healthful fats, such as nuts, avocado, olive oil, canola oil, and fish. DOES EVERYONE WITH DIABETES MELLITUS HAVE THE SAME MEAL PLAN? Because every person with diabetes mellitus is different, there is not one meal plan that works for everyone. It is very important that you meet with a dietitian who will help you create a meal plan that is just right for you.   This information is not intended to replace advice given to you by your health care provider. Make sure you discuss any questions you have with your health care provider.   Document Released: 11/20/2004 Document Revised: 03/16/2014 Document Reviewed: 01/20/2013 Elsevier Interactive Patient Education [employer].

## 2015-04-19 NOTE — Progress Notes (Signed)
Medication(s) were reviewed with the patient, including name, instructions, indication, goals of therapy, potential side effects, importance of adherence, and safe use.   Patient verbalized understanding by repeating back information and was advised to contact me if further medication-related questions arise. Patient was also provided an information handout.  Patient reports overall concerns with medication cost but able to afford for now. D/c glipizide (only on 2.5 mg/day) for now with further reinforcement of dietary modifications. Approved by PCP.  Medication Samples have been provided to the patient.  Drug: Xarelto (rivaroxaban) Strength: 20 mg Qty: 28 LOT: [id number] Exp.Date: 06/2017  The patient has been instructed regarding the correct time, dose, and frequency of taking this medication, including desired effects and most common side effects.   Arretta Toenjes J 6:54 PM 04/19/2015

## 2015-06-12 ENCOUNTER — Other Ambulatory Visit: Payer: Self-pay

## 2015-06-12 DIAGNOSIS — Z1231 Encounter for screening mammogram for malignant neoplasm of breast: Secondary | ICD-10-CM

## 2015-06-19 ENCOUNTER — Other Ambulatory Visit: Payer: Self-pay | Admitting: Internal Medicine

## 2015-06-26 ENCOUNTER — Other Ambulatory Visit: Payer: Self-pay | Admitting: Internal Medicine

## 2015-06-28 ENCOUNTER — Ambulatory Visit
Admission: RE | Admit: 2015-06-28 | Discharge: 2015-06-28 | Disposition: A | Discharge status: Home / Self Care | Payer: PPO | Source: Ambulatory Visit

## 2015-06-28 DIAGNOSIS — Z1231 Encounter for screening mammogram for malignant neoplasm of breast: Secondary | ICD-10-CM | POA: Diagnosis not present

## 2015-07-08 NOTE — Addendum Note (Signed)
Addended by: Truddie Crumble on: 07/08/2015 09:21 AM   Modules accepted: Orders

## 2015-07-10 ENCOUNTER — Other Ambulatory Visit: Payer: Self-pay | Admitting: Internal Medicine

## 2015-07-12 NOTE — Telephone Encounter (Signed)
Verified with Pharmacy that patient picked up her 3 month supply today and their computer system automatically generates a refill request.

## 2015-07-26 ENCOUNTER — Other Ambulatory Visit: Payer: Self-pay | Admitting: Internal Medicine

## 2015-08-12 ENCOUNTER — Ambulatory Visit (INDEPENDENT_AMBULATORY_CARE_PROVIDER_SITE_OTHER): Payer: PPO | Admitting: Internal Medicine

## 2015-08-12 ENCOUNTER — Encounter: Payer: Self-pay | Admitting: Internal Medicine

## 2015-08-12 VITALS — BP 125/72 | HR 70 | Temp 98.3°F | Ht 63.0 in | Wt 178.9 lb

## 2015-08-12 DIAGNOSIS — G8929 Other chronic pain: Secondary | ICD-10-CM

## 2015-08-12 DIAGNOSIS — Z7982 Long term (current) use of aspirin: Secondary | ICD-10-CM

## 2015-08-12 DIAGNOSIS — Z79899 Other long term (current) drug therapy: Secondary | ICD-10-CM

## 2015-08-12 DIAGNOSIS — E1121 Type 2 diabetes mellitus with diabetic nephropathy: Secondary | ICD-10-CM

## 2015-08-12 DIAGNOSIS — I6521 Occlusion and stenosis of right carotid artery: Secondary | ICD-10-CM | POA: Diagnosis not present

## 2015-08-12 DIAGNOSIS — M549 Dorsalgia, unspecified: Secondary | ICD-10-CM

## 2015-08-12 DIAGNOSIS — I4891 Unspecified atrial fibrillation: Secondary | ICD-10-CM

## 2015-08-12 DIAGNOSIS — F329 Major depressive disorder, single episode, unspecified: Secondary | ICD-10-CM

## 2015-08-12 DIAGNOSIS — I10 Essential (primary) hypertension: Secondary | ICD-10-CM

## 2015-08-12 DIAGNOSIS — E119 Type 2 diabetes mellitus without complications: Secondary | ICD-10-CM

## 2015-08-12 DIAGNOSIS — I6523 Occlusion and stenosis of bilateral carotid arteries: Secondary | ICD-10-CM

## 2015-08-12 DIAGNOSIS — F32A Depression, unspecified: Secondary | ICD-10-CM

## 2015-08-12 DIAGNOSIS — E039 Hypothyroidism, unspecified: Secondary | ICD-10-CM

## 2015-08-12 DIAGNOSIS — Z7901 Long term (current) use of anticoagulants: Secondary | ICD-10-CM

## 2015-08-12 LAB — GLUCOSE, CAPILLARY: Glucose-Capillary: 100 mg/dL — ABNORMAL HIGH (ref 65–99)

## 2015-08-12 LAB — POCT GLYCOSYLATED HEMOGLOBIN (HGB A1C): Hemoglobin A1C: 6.1

## 2015-08-12 NOTE — Progress Notes (Signed)
Subjective:    Patient ID: [name], female    DOB: 07-Jun-1950, 65 y.o.   MRN: [id number]  HPI Comments: [name] is a 65 year old woman with PMH as below here for follow-up of HTN.  Please see problem based charting for status of this and other chronic conditions.    Past Medical History  Diagnosis Date  . Depression   . Skin neoplasm   . Bunion   . Carotid stenosis   . Fibromyalgia   . Internal hemorrhoid   . GERD (gastroesophageal reflux disease)   . Chronic gastritis   . Transaminase or LDH elevation   . Mild cognitive impairment   . Hyperlipidemia   . Fatty liver   . Dysphagia     no documented strictures but responded positively to dilation in past.   . Hypertension   . Cholecystitis     s/p cholecystectomy  . Sarcoidosis ([place])   . Fibromyalgia   . CHF (congestive heart failure) ([place])   . Atrial fibrillation ([place])   . Hypothyroid   . Diverticulosis   . Digoxin toxicity     08/2011  . Bradycardia, drug induced     08/2011 (dig)  . Drug-induced hypotension     08/2011 (Dig)  . Carpal tunnel syndrome     mild   . Diverticulitis     dx 04/08/14  . Family history of adverse reaction to anesthesia     "my mother may have"  . Pneumonia X 2  . OSA (obstructive sleep apnea) dx'd 2003    "can't sleep w/that mask; lost some weight; maybe that's helped" (04/26/2014)  . Diabetic peripheral neuropathy ([place])   . Type II diabetes mellitus ([place])   . H/O hiatal hernia     S/P hernia repair  . Epileptic seizure, tonic ([place])     .No meds since age of 65.  [place] Kitchen TIA (transient ischemic attack)     "don't know when I had it" (04/26/2014)  . Osteoarthritis   . Chronic back pain    Current Outpatient Prescriptions on File Prior to Visit  Medication Sig Dispense Refill  . ACCU-CHEK FASTCLIX LANCETS MISC Check blood sugar one time a day 102 each 4  . atenolol (TENORMIN) 25 MG tablet TAKE 1 AND 1/2 TABLETS TWICE DAILY 270 tablet 1  . Blood Glucose Calibration (ACCU-CHEK  SMARTVIEW CONTROL) LIQD 1 Device by In Vitro route as needed. Use to perform control/ calibration on accu-chek meter. diag code E11.9. Non insulin dependent 3 each 0  . Blood Glucose Monitoring Suppl (ACCU-CHEK AVIVA CONNECT) W/DEVICE KIT 1 each by Does not apply route every morning. 1 kit 0  . Cholecalciferol ([employer] VITAMIN D3) 1000 units capsule Take 1 capsule (1,000 Units total) by mouth daily. 90 capsule 1  . diltiazem (CARTIA XT) 180 MG 24 hr capsule Take 1 capsule (180 mg total) by mouth daily. 90 capsule 1  . famotidine (PEPCID) 20 MG tablet Take 1 tablet (20 mg total) by mouth daily. 90 tablet 1  . FLUoxetine (PROZAC) 20 MG capsule Take 1 capsule (20 mg total) by mouth daily. 90 capsule 1  . glucose blood (ACCU-CHEK AVIVA) test strip Check blood sugar one time a day 100 each 4  . levothyroxine (SYNTHROID, LEVOTHROID) 50 MCG tablet Take 1 tablet (50 mcg total) by mouth every morning. 90 tablet 1  . losartan (COZAAR) 25 MG tablet Take 0.5 tablets (12.5 mg total) by mouth daily. 50 tablet 1  . Probiotic  Product (TRUNATURE DIGESTIVE PROBIOTIC PO) Take 1 capsule by mouth daily.    . rivaroxaban (XARELTO) 20 MG TABS tablet Take 1 tablet (20 mg total) by mouth daily with supper. 90 tablet 3  . rosuvastatin (CRESTOR) 20 MG tablet Take 1 tablet (20 mg total) by mouth daily. 90 tablet 1   No current facility-administered medications on file prior to visit.    Review of Systems  Constitutional: Negative for appetite change and unexpected weight change.  Respiratory: Negative for shortness of breath and wheezing.   Cardiovascular: Negative for chest pain, palpitations and leg swelling.  Gastrointestinal: Negative for nausea, vomiting, abdominal pain, diarrhea and constipation.  Genitourinary: Negative for dysuria and difficulty urinating.       Filed Vitals:   08/12/15 1400  BP: 125/72  Pulse: 70  Temp: 98.3 F (36.8 C)  TempSrc: Oral  Height: _0  (1.6 m)  Weight: 178 lb 14.4 oz (81.149  kg)  SpO2: 98%    Objective:   Physical Exam  Constitutional: She is oriented to person, place, and time. She appears well-developed. No distress.  HENT:  Head: Normocephalic and atraumatic.  Mouth/Throat: Oropharynx is clear and moist. No oropharyngeal exudate.  Eyes: Conjunctivae and EOM are normal. Pupils are equal, round, and reactive to light. No scleral icterus.  Neck: Neck supple.  Cardiovascular: Normal rate and normal heart sounds.  Exam reveals no gallop and no friction rub.   No murmur heard. irregular  Pulmonary/Chest: Effort normal and breath sounds normal. No respiratory distress. She has no wheezes. She has no rales.  Abdominal: Soft. Bowel sounds are normal. She exhibits no distension. There is no tenderness. There is no rebound and no guarding.  Musculoskeletal: Normal range of motion. She exhibits no edema or tenderness.  Neurological: She is alert and oriented to person, place, and time. No cranial nerve deficit.  Skin: Skin is warm and dry. No rash noted. She is not diaphoretic.  Psychiatric: She has a normal mood and affect. Her behavior is normal. Judgment and thought content normal.  Vitals reviewed.         Assessment & Plan:  Please see problem based charting for A&P.

## 2015-08-12 NOTE — Patient Instructions (Signed)
1. Great job with healthy lifestyle changes.  Keep up the walking!  I [name] call you if there is a problem with your labs.      2. Please take all medications as prescribed.    3. If you have worsening of your symptoms or new symptoms arise, please call the clinic [phone]), or go to the ER immediately if symptoms are severe.  [name] can forward your records to your new physician once you move.  Please follow-up with [place] in 3 months if you are still in the area.

## 2015-08-13 LAB — TSH: TSH: 0.64 u[IU]/mL (ref 0.450–4.500)

## 2015-08-14 MED ORDER — FAMOTIDINE 20 MG PO TABS: 20.0000 mg | ORAL_TABLET | Freq: Every day | ORAL | Status: DC

## 2015-08-14 MED ORDER — DILTIAZEM HCL ER COATED BEADS 180 MG PO CP24: 180.0000 mg | ORAL_CAPSULE | Freq: Every day | ORAL | Status: DC

## 2015-08-14 MED ORDER — FLUOXETINE HCL 20 MG PO CAPS: 20.0000 mg | ORAL_CAPSULE | Freq: Every day | ORAL | Status: DC

## 2015-08-14 MED ORDER — LOSARTAN POTASSIUM 25 MG PO TABS: 12.5000 mg | ORAL_TABLET | Freq: Every day | ORAL | Status: DC

## 2015-08-14 MED ORDER — ATENOLOL 25 MG PO TABS: ORAL_TABLET | ORAL | Status: DC

## 2015-08-14 MED ORDER — LEVOTHYROXINE SODIUM 50 MCG PO TABS: 50.0000 ug | ORAL_TABLET | Freq: Every morning | ORAL | Status: DC

## 2015-08-14 NOTE — Assessment & Plan Note (Addendum)
BP Readings from Last 3 Encounters:  08/12/15 125/72  04/03/15 134/69  12/19/14 125/73    Lab Results  Component Value Date   NA 141 04/03/2015   K 4.6 04/03/2015   CREATININE 0.76 04/03/2015    Assessment: Blood pressure control:  well controlled Progress toward BP goal:   at goal Comments: Complaint with trx.  Also, walking for exercise.   Plan: Medications:  continue current medications:  Atenolol 37.5mg  daily, diltiazem 180mg  daily, losartan 12.5mg  daily Educational resources provided: brochure (denies) Self management tools provided:   Other plans: RTC in 3 months if she is still living in [place] (she plans on moving to [place] soon).

## 2015-08-14 NOTE — Assessment & Plan Note (Signed)
Assessment:  No S&S hyper/hypo.  Diarrhea has greatly improved since she made dietary changes.  She has made lifestyle changes (walking, dietary changes) and purposefully lost weight (17 lbs) since our last visit 5 months ago. Plan:  Check TSH, dose may need to be adjusted given recent weight loss.

## 2015-08-14 NOTE — Assessment & Plan Note (Signed)
Assessment:  Stable.  No loss of B/B control, saddle anesthesia, night pain or gait abnormalities.  Pain responds to rest.  She was unable to get to PT and wants to hold off on another referral for now. Plan:  Continue conservative measures.  To ED with red flag symptoms.

## 2015-08-14 NOTE — Assessment & Plan Note (Addendum)
Lab Results  Component Value Date   HGBA1C 6.1 08/12/2015   HGBA1C 6.6 04/03/2015   HGBA1C 6.7 12/19/2014     Assessment: Diabetes control:  well controlled Progress toward A1C goal:   at goal Comments: Glipizide d/c four months ago (due to good control on low dose and to cut costs for patient) and she has implement healthy lifestyle changes - walking for exercise, eating better.  Plan: Medications:  None; continue healthy lifestyle changes Home glucose monitoring: Frequency:   Timing:   Instruction/counseling given: discussed diet Educational resources provided: brochure (denies ) Self management tools provided:   Other plans: RTC in 3 months if still in [place] (she is planning on moving to [place] soon).

## 2015-08-14 NOTE — Assessment & Plan Note (Signed)
Assessment:  Mood is stable.  Affect appropriate.  She is in good spirits about upcoming marriage and plans to move to [place]. Plan:  Continue Prozac 20mg  daily.

## 2015-08-14 NOTE — Progress Notes (Signed)
Internal Medicine Clinic Attending  Case discussed with Dr. Wilson soon after the resident saw the patient.  We reviewed the resident's history and exam and pertinent patient test results.  I agree with the assessment, diagnosis, and plan of care documented in the resident's note.  

## 2015-08-14 NOTE — Assessment & Plan Note (Addendum)
Assessment:  Stable 60-79% right ICA stenosis on [place] 1 year ago.  No bruits on exam. She is asymptomatic.  Plan:  Continue ASA, Crestor 20mg  daily.  Follow-up with cards as previously schedule at the end of this month.

## 2015-08-14 NOTE — Assessment & Plan Note (Addendum)
Assessment:  Rate controlled.  Compliant with diltiazem and Xarelto.  No bleeding/bruising. Plan:  Continue atenolol 37.5mg  daily, diltiazem 180mg  daily and Xarelto 20mg  daily.  Follow-up with cards as previously schedule at the end of this month.

## 2015-08-20 ENCOUNTER — Telehealth: Payer: Self-pay

## 2015-08-20 NOTE — Telephone Encounter (Signed)
[name] is a 66 y.o. female who was contacted via telephone for monitoring of rivaroxaban (Xarelto) therapy.    ASSESSMENT Indication(s): atrial fibrillation and TIA Duration: indefinite  Labs:    Component Value Date/Time   AST 24 04/03/2015 1531   ALT 24 04/03/2015 1531   NA 141 04/03/2015 1531   NA 139 05/24/2014 1608   K 4.6 04/03/2015 1531   CL 103 04/03/2015 1531   CO2 22 04/03/2015 1531   GLUCOSE 86 04/03/2015 1531   GLUCOSE 80 05/24/2014 1608   HGBA1C 6.1 08/12/2015 1426   HGBA1C 6.3* 07/29/2011 1422   BUN 13 04/03/2015 1531   BUN 12 05/24/2014 1608   CREATININE 0.76 04/03/2015 1531   CREATININE 0.80 05/24/2014 1608   CALCIUM 9.7 04/03/2015 1531   GFRNONAA 82 04/03/2015 1531   GFRNONAA 78 05/24/2014 1608   GFRAA 95 04/03/2015 1531   GFRAA 89 05/24/2014 1608   WBC 6.8 04/03/2015 1531   WBC 6.5 04/26/2014 1654   HGB 13.0 04/26/2014 1654   HCT 41.5 04/03/2015 1531   HCT 39.7 04/26/2014 1654   PLT 225 04/03/2015 1531   PLT 307 04/26/2014 1654    rivaroxaban (Xarelto) Dose: 20 mg daily  Safety: Patient has not had recent bleeding/thromboembolic events. Patient reports no recent signs or symptoms of bleeding, no signs of symptoms of thromboembolism. Medication changes: no.  Adherence: Patient reports no known adherence challenges. Patient does correctly recite the dose. Contacted pharmacy and records indicate refills are consistent. Patient did say she will need help affording medication next time because she will be in the donut hole at that time. Currently, she is paying 80 dollars for a 3 month supply and says she can afford that. Mentioned getting samples next time.   Patient Instructions: Patient advised to contact clinic or seek medical attention if signs/symptoms of bleeding or thromboembolism occur. Patient verbalized understanding by repeating back information.  Follow-up Next appointment with cardiology 09/03/15.  Angelena Form PharmD Candidate   08/20/2015, 2:48 PM

## 2015-08-21 ENCOUNTER — Encounter: Payer: Self-pay | Admitting: *Deleted

## 2015-09-03 ENCOUNTER — Encounter: Payer: Self-pay | Admitting: Cardiology

## 2015-09-03 ENCOUNTER — Ambulatory Visit (INDEPENDENT_AMBULATORY_CARE_PROVIDER_SITE_OTHER): Payer: PPO | Admitting: Cardiology

## 2015-09-03 VITALS — BP 120/80 | HR 66 | Ht 63.0 in | Wt 175.4 lb

## 2015-09-03 DIAGNOSIS — I4891 Unspecified atrial fibrillation: Secondary | ICD-10-CM | POA: Diagnosis not present

## 2015-09-03 DIAGNOSIS — I482 Chronic atrial fibrillation, unspecified: Secondary | ICD-10-CM

## 2015-09-03 NOTE — Progress Notes (Signed)
Cardiology Office Note   Date:  09/03/2015   ID:  [name], [name] Mar 21, 1950, MRN [id number]  PCP:  Duwaine Maxin, DO  Cardiologist:   Minus Breeding, MD   Chief Complaint  Patient presents with  . Atrial Fibrillation      History of Present Illness: [name] is a 65 y.o. female who presents for follow-up of atrial fibrillation. She has permanent atrial fibrillation.  Since I last saw her she has done well.    She has lost over 30 lbs over a few years.  She is waling for exercise.  She might be getting married soon.  She says that she feels the best that she has felt in quite awhile.  She rarely notices palpitations only if she gets upset.   The patient denies any new symptoms such as chest discomfort, neck or arm discomfort. There has been no new shortness of breath, PND or orthopnea. There have been no reported presyncope or syncope.  Past Medical History  Diagnosis Date  . Depression   . Skin neoplasm   . Bunion   . Carotid stenosis   . Fibromyalgia   . Internal hemorrhoid   . GERD (gastroesophageal reflux disease)   . Chronic gastritis   . Transaminase or LDH elevation   . Mild cognitive impairment   . Hyperlipidemia   . Fatty liver   . Dysphagia     no documented strictures but responded positively to dilation in past.   . Hypertension   . Cholecystitis     s/p cholecystectomy  . Sarcoidosis ([place])   . Fibromyalgia   . Atrial fibrillation ([place])   . Hypothyroid   . Diverticulosis   . Digoxin toxicity     08/2011  . Bradycardia, drug induced     08/2011 (dig)  . Drug-induced hypotension     08/2011 (Dig)  . Carpal tunnel syndrome     mild   . Diverticulitis     dx 04/08/14  . Family history of adverse reaction to anesthesia     "my mother may have"  . Pneumonia X 2  . OSA (obstructive sleep apnea) dx'd 2003    "can't sleep w/that mask; lost some weight; maybe that's helped" (04/26/2014)  . Diabetic peripheral neuropathy ([place])   . Type II diabetes  mellitus ([place])   . H/O hiatal hernia     S/P hernia repair  . Epileptic seizure, tonic ([place])     .No meds since age of 22.  Marland Kitchen TIA (transient ischemic attack)     "don't know when I had it" (04/26/2014)  . Osteoarthritis   . Chronic back pain     Past Surgical History  Procedure Laterality Date  . Hiatal hernia repair  1990    "had to have scar tissue removed 6 months after repair"  . Esophageal dilation    . Cataract extraction w/phaco  10/27/2010    Procedure: CATARACT EXTRACTION PHACO AND INTRAOCULAR LENS PLACEMENT (IOC);  Surgeon: Williams Che;  Location: [facility];  Service: Ophthalmology;  Laterality: Left;  CDE- 1.78  . Cataract extraction w/phaco  07/13/2011    Procedure: CATARACT EXTRACTION PHACO AND INTRAOCULAR LENS PLACEMENT (IOC);  Surgeon: Williams Che, MD;  Location: [facility];  Service: Ophthalmology;  Laterality: Right;  CDE:  1.65  . Liposuction  1992  . Inverted nipples  1992  . Breast surgery Right ~ 2010    excision milk duct;  . Foot surgery Left ~  2011    "straightened toe & scraped bone below big toe"  . Foot surgery Right ~ 2011    "scraped bone of big toe; shortened middle toet"  . Dilation and curettage of uterus  1970; 1976  . Abdominal hysterectomy  1980's    partial  . Tubal ligation  1976  . Bilateral oophorectomy  1980's    "after partial hysterectomy"  . Skin cancer excision  1990's    "front side of right shin"  . Ventral hernia repair  07/29/2011    Procedure: LAPAROSCOPIC VENTRAL HERNIA;  Surgeon: Adin Hector, MD;  Location: [facility];  Service: General;  Laterality: N/A;  multiple incarcerated hernias with mesh  . Esophageal dilation  multiple  . Breast biopsy Right     benign  . Salpingoophorectomy Bilateral     "6 months or so after hysterectomy"  . Laparoscopic cholecystectomy  1980's     Current Outpatient Prescriptions  Medication Sig Dispense Refill  . atenolol (TENORMIN) 25 MG tablet TAKE 1 AND 1/2 TABLETS TWICE DAILY 150 tablet  1  . Cholecalciferol ([employer] VITAMIN D3) 1000 units capsule Take 1 capsule (1,000 Units total) by mouth daily. 90 capsule 1  . diltiazem (CARTIA XT) 180 MG 24 hr capsule Take 1 capsule (180 mg total) by mouth daily. 90 capsule 1  . famotidine (PEPCID) 20 MG tablet Take 1 tablet (20 mg total) by mouth daily. 90 tablet 1  . FLUoxetine (PROZAC) 20 MG capsule Take 1 capsule (20 mg total) by mouth daily. 90 capsule 1  . levothyroxine (SYNTHROID, LEVOTHROID) 50 MCG tablet Take 1 tablet (50 mcg total) by mouth every morning. 90 tablet 1  . losartan (COZAAR) 25 MG tablet Take 0.5 tablets (12.5 mg total) by mouth daily. 50 tablet 1  . Probiotic Product (TRUNATURE DIGESTIVE PROBIOTIC PO) Take 1 capsule by mouth daily.    . rivaroxaban (XARELTO) 20 MG TABS tablet Take 1 tablet (20 mg total) by mouth daily with supper. 90 tablet 3  . rosuvastatin (CRESTOR) 20 MG tablet Take 1 tablet (20 mg total) by mouth daily. 90 tablet 1   No current facility-administered medications for this visit.    Allergies:   Gemfibrozil; Latex; Penicillins; Adhesive; Codeine; Digoxin and related; Metformin and related; Omeprazole; and Ace inhibitors    ROS:  Please see the history of present illness.   Otherwise, review of systems are positive for none.   All other systems are reviewed and negative.    PHYSICAL EXAM: VS:  BP 120/80 mmHg  Pulse 66  Ht 5\' 3"  (1.6 m)  Wt 175 lb 6.4 oz (79.561 kg)  BMI 31.08 kg/m2  SpO2 99%  LMP 06/03/1978 , BMI Body mass index is 31.08 kg/(m^2). GENERAL:  Well appearing NECK:  No jugular venous distention, waveform within normal limits, carotid upstroke brisk and symmetric, no bruits, no thyromegaly LUNGS:  Clear to auscultation bilaterally CHEST:  Unremarkable HEART:  PMI not displaced or sustained,S1 and S2 within normal limits, no S3, no clicks, no rubs, no murmurs, irregular ABD:  Flat, positive bowel sounds normal in frequency in pitch, no bruits, no rebound, no guarding, no midline  pulsatile mass, no hepatomegaly, no splenomegaly EXT:  2 plus pulses throughout, no edema, no cyanosis no clubbing  EKG:  Atrial fibrillation, rate 66, axis within normal limits, intervals within normal limits, no acute ST-T wave changes. 09/03/2015  Recent Labs: 04/03/2015: ALT 24; BUN 13; Creatinine, Ser 0.76; Platelets 225; Potassium 4.6; Sodium 141 08/12/2015: TSH  0.640    Lipid Panel    Component Value Date/Time   CHOL 133 04/19/2014 1603   TRIG 181* 04/19/2014 1603   HDL 46 04/19/2014 1603   CHOLHDL 2.9 04/19/2014 1603   VLDL 36 04/19/2014 1603   LDLCALC 51 04/19/2014 1603      Wt Readings from Last 3 Encounters:  09/03/15 175 lb 6.4 oz (79.561 kg)  08/12/15 178 lb 14.4 oz (81.149 kg)  04/03/15 195 lb 1.6 oz (88.497 kg)      Other studies Reviewed: Additional studies/ records that were reviewed today include:  None  Review of the above records demonstrates:  Please see elsewhere in the note.     ASSESSMENT AND PLAN:  ATRIAL FIB:  The patient has atrial fibrillation permanently. However, she tolerates anticoagulation she's had reasonable rate. At this point no change in therapy or further cardiovascular testing is indicated.   [name] has a CHA2DS2 - VASc score of 4 with a risk of stroke of 4%.   Current medicines are reviewed at length with the patient today.  The patient does not have concerns regarding medicines.  The following changes have been made:  no change  Labs/ tests ordered today include: None     Disposition:   FU with me in one year.     Signed, Minus Breeding, MD  09/03/2015 5:21 PM    [facility]

## 2015-09-03 NOTE — Patient Instructions (Signed)
Medication Instructions:  Continue current medications  Labwork: NONE  Testing/Procedures: NONE  Follow-Up: Your physician wants you to follow-up in: 1 Year. You will receive a reminder letter in the mail two months in advance. If you don't receive a letter, please call our office to schedule the follow-up appointment.   Any Other Special Instructions Will Be Listed Below (If Applicable).   If you need a refill on your cardiac medications before your next appointment, please call your pharmacy.   

## 2015-10-21 ENCOUNTER — Other Ambulatory Visit: Payer: Self-pay | Admitting: Internal Medicine

## 2015-11-28 ENCOUNTER — Ambulatory Visit (INDEPENDENT_AMBULATORY_CARE_PROVIDER_SITE_OTHER): Payer: PPO | Admitting: Internal Medicine

## 2015-11-28 ENCOUNTER — Encounter: Payer: Self-pay | Admitting: Internal Medicine

## 2015-11-28 VITALS — BP 128/74 | HR 72 | Temp 98.6°F | Ht 63.0 in | Wt 175.9 lb

## 2015-11-28 DIAGNOSIS — E119 Type 2 diabetes mellitus without complications: Secondary | ICD-10-CM

## 2015-11-28 DIAGNOSIS — E039 Hypothyroidism, unspecified: Secondary | ICD-10-CM | POA: Diagnosis not present

## 2015-11-28 DIAGNOSIS — E1121 Type 2 diabetes mellitus with diabetic nephropathy: Secondary | ICD-10-CM

## 2015-11-28 DIAGNOSIS — E785 Hyperlipidemia, unspecified: Secondary | ICD-10-CM

## 2015-11-28 DIAGNOSIS — I482 Chronic atrial fibrillation, unspecified: Secondary | ICD-10-CM

## 2015-11-28 DIAGNOSIS — Z7901 Long term (current) use of anticoagulants: Secondary | ICD-10-CM

## 2015-11-28 DIAGNOSIS — I10 Essential (primary) hypertension: Secondary | ICD-10-CM

## 2015-11-28 DIAGNOSIS — G8929 Other chronic pain: Secondary | ICD-10-CM

## 2015-11-28 DIAGNOSIS — M546 Pain in thoracic spine: Secondary | ICD-10-CM

## 2015-11-28 DIAGNOSIS — Z79899 Other long term (current) drug therapy: Secondary | ICD-10-CM

## 2015-11-28 DIAGNOSIS — F329 Major depressive disorder, single episode, unspecified: Secondary | ICD-10-CM

## 2015-11-28 DIAGNOSIS — F32A Depression, unspecified: Secondary | ICD-10-CM

## 2015-11-28 DIAGNOSIS — M549 Dorsalgia, unspecified: Secondary | ICD-10-CM

## 2015-11-28 LAB — GLUCOSE, CAPILLARY: GLUCOSE-CAPILLARY: 109 mg/dL — AB (ref 65–99)

## 2015-11-28 LAB — POCT GLYCOSYLATED HEMOGLOBIN (HGB A1C): Hemoglobin A1C: 6

## 2015-11-28 MED ORDER — NAPROXEN 500 MG PO TABS: 500.0000 mg | ORAL_TABLET | Freq: Two times a day (BID) | ORAL | 2 refills | Status: DC

## 2015-11-28 NOTE — Patient Instructions (Addendum)
Thank you for coming to the clinic today. It was really nice to meet you.  We'll continue all of your current medications, as it seems you're doing very well in terms of your blood pressure and diabetes.  We will try to address the increase in your back pain by continuing Tylenol. You can take this medication every day as directed on the bottle that you get over-the-counter at the drugstore. We'll also make a referral for physical therapy as they may be able to give you some exercises that will be helpful in reducing your pain.  I plan to follow-up with you in 4 weeks to see how these therapies are working for your back pain and we can make adjustments as needed at that time. Otherwise I will plan to see you again in 3-4 months to follow-up on your blood sugar and her blood pressure.

## 2015-11-28 NOTE — Assessment & Plan Note (Signed)
Patient's chronic atrial fibrillation. Currently followed by cardiology. She was last seen on June 27 and had good report. She is compliant with her Xarelto and diltiazem and atenolol therapies. She denies any adverse reactions to these medicines. She does note that she sometimes has symptomatic palpitations when she gets upset. These typically are self-limited and resolve with control of her emotional symptoms. She denies experiencing chest pain with these episodes.  She was advised if she were to experience chest tightness or chest pain that she should present to the Emergency Department as this may be a sign of myocardial infarction. Patient will follow-up with cardiology. We will continue current therapy.

## 2015-11-28 NOTE — Assessment & Plan Note (Signed)
Patient blood pressure is well controlled currently. She has had good success in keeping her blood pressure in check over the last year that she has lost over 30 pounds and currently has kept her weight stable over the last 3 months. She is trying to stay active, but feels that her activity is somewhat limited by her back pain. She is good compliance with her atenolol, diltiazem, and losartan.  Her blood pressure today is 128/74. We'll continue her current therapy.

## 2015-11-28 NOTE — Assessment & Plan Note (Signed)
Patient currently denies any symptoms symptoms of depression. She reports that her mood is been stable. She occasionally feels frustrated with her chronic back pain, but has no significant complaints. She continues to be compliant with her fluoxetine 20 mg. We will continue current therapy.

## 2015-11-28 NOTE — Assessment & Plan Note (Signed)
Patient is currently compliant with her Synthroid medication. She denies any symptoms of hypo-hyperparathyroidism. She occasionally gets some palpitations which she feels are related to her chronic atrial fibrillation when she gets upset, but these are typically self-limited. She denies any other symptoms of diarrhea, agitation, increased appetite or related to hypothyroidism. Last TSH in June was within normal limits, plan to retest in one year.

## 2015-11-28 NOTE — Assessment & Plan Note (Addendum)
Patient is currently on rosuvastatin 20 mg and is tolerating this well. Her last lipid panel was drawn in February 2016. Patient has a history of carotid stenosis, but denies any current symptoms. She has no bruits on exam today.  At that time she had good control of her cholesterol. I feel is reasonable to delay retesting of this for another 6 months. Continue rosuvastatin 20 mg.

## 2015-11-28 NOTE — Progress Notes (Signed)
CC: f/u HTN HPI: [name] is a 65 y.o. female with a h/o of HTN, hypothyroidism, GERD, chronic a-fib, t2DM, depression who presents for follow up of her DM and HTN as well as routine management of the above conditions.  Please see Problem-based charting for HPI and the status of patient's chronic medical conditions.  Past Medical History:  Diagnosis Date  . Atrial fibrillation ([place])   . Bradycardia, drug induced    08/2011 (dig)  . Bunion   . Carotid stenosis   . Carpal tunnel syndrome    mild   . Cholecystitis    s/p cholecystectomy  . Chronic back pain   . Chronic gastritis   . Depression   . Diabetic peripheral neuropathy ([place])   . Digoxin toxicity    08/2011  . Diverticulitis    dx 04/08/14  . Diverticulosis   . Drug-induced hypotension    08/2011 ([facility])  . Dysphagia    no documented strictures but responded positively to dilation in past.   . 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  . Family history of adverse reaction to anesthesia    "my mother may have"  . Fatty liver   . Fibromyalgia   . Fibromyalgia   . GERD (gastroesophageal reflux disease)   . H/O hiatal hernia    S/P hernia repair  . Hyperlipidemia   . Hypertension   . Hypothyroid   . Internal hemorrhoid   . Mild cognitive impairment   . OSA (obstructive sleep apnea) dx'd 2003   "can't sleep w/that mask; lost some weight; maybe that's helped" (04/26/2014)  . Osteoarthritis   . Pneumonia X 2  . Sarcoidosis ([place])   . Skin neoplasm   . TIA (transient ischemic attack)    "don't know when I had it" (04/26/2014)  . Transaminase or LDH elevation   . Type II diabetes mellitus ([place])    Social Hx: Pt is currently retired living on fixed income w/ SS. 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  Review of Systems: ROS in HPI. Otherwise: Review of Systems  Constitutional: Negative for chills, fever and weight loss.  Respiratory:  Negative for cough and shortness of breath.   Cardiovascular: Negative for chest pain and leg swelling.  Gastrointestinal: Negative for abdominal pain, constipation, diarrhea, nausea and vomiting.  Genitourinary: Negative for dysuria, frequency and urgency.  Musculoskeletal: Positive for back pain. Negative for falls, joint pain, myalgias and neck pain.  Neurological: Negative for dizziness and headaches.    Physical Exam: Vitals:   11/28/15 1320  BP: 128/74  Pulse: 72  Temp: 98.6 F (37 C)  TempSrc: Oral  SpO2: 99%  Weight: 175 lb 14.4 oz (79.8 kg)  Height: 5\' 3"  (1.6 m)   Physical Exam  Constitutional: She appears well-developed. She is cooperative. No distress.  Neck: Normal range of motion. Neck supple.  Cardiovascular: Normal rate, regular rhythm, normal heart sounds and normal pulses.  Exam reveals no gallop.   No murmur heard. Pulmonary/Chest: Effort normal and breath sounds normal. No respiratory distress. She has no wheezes. She has no rhonchi. She has no rales. Breasts are symmetrical.  Abdominal: Soft. Bowel sounds are normal. There is no tenderness.  Musculoskeletal: She exhibits no edema.       Cervical back: She exhibits tenderness (mild). She exhibits no deformity and no spasm.       Thoracic back: She exhibits  tenderness and pain. She exhibits no deformity and no spasm.       Lumbar back: She exhibits no tenderness, no deformity, no pain and no spasm.    Assessment & Plan:  See encounters tab for problem based medical decision making. Patient seen with Dr. Dareen Piano  Hypothyroidism Patient is currently compliant with her Synthroid medication. She denies any symptoms of hypo-hyperparathyroidism. She occasionally gets some palpitations which she feels are related to her chronic atrial fibrillation when she gets upset, but these are typically self-limited. She denies any other symptoms of diarrhea, agitation, increased appetite or related to hypothyroidism. Last TSH  in June was within normal limits, plan to retest in one year.  Hyperlipidemia Patient is currently on rosuvastatin 20 mg and is tolerating this well. Her last lipid panel was drawn in February 2016. Patient has a history of carotid stenosis, but denies any current symptoms. She has no bruits on exam today.  At that time she had good control of her cholesterol. I feel is reasonable to delay retesting of this for another 6 months. Continue rosuvastatin 20 mg.  Essential hypertension Patient blood pressure is well controlled currently. She has had good success in keeping her blood pressure in check over the last year that she has lost over 30 pounds and currently has kept her weight stable over the last 3 months. She is trying to stay active, but feels that her activity is somewhat limited by her back pain. She is good compliance with her atenolol, diltiazem, and losartan.  Her blood pressure today is 128/74. We'll continue her current therapy.  Depression Patient currently denies any symptoms symptoms of depression. She reports that her mood is been stable. She occasionally feels frustrated with her chronic back pain, but has no significant complaints. She continues to be compliant with her fluoxetine 20 mg. We will continue current therapy.  Diabetes type 2, controlled Patient's diabetes is under good control with A1c of 6.0 today. Her A1c has been downtrending over the last year and she continues to report that she is doing well on her diet. She is currently taking apple cider vinegar, honey, and cinnamon before each meal in order to help regulate her appetite and prevent her from overeating. This treatment is working well in helping her maintain her weight loss. I encouraged her to continue to try to eat healthfully and to avoid excess carbohydrates but applauded her on her progress with her sugar management. She is not currently on any pharmacotherapy and I feel that this is appropriate given her  A1c today. We will plan to follow-up in 3 months.  Chronic back pain Patient reports her primary complaint today is her back pain. She notes that she has significant pain in the thoracic region of her back that sometimes radiates up to the cervical region. She notes that this pain is worse with activity. She describes the pain as aching. She denies any pain in the paraspinal regions. She notes that she has had significant worsening of this over the last 6 months. Back in 2015 she had a MRI which demonstrated some degenerative disc disease. Recently she's been treating this pain with Tylenol that she has been taking 1500 mg daily without success. She reports that she has tried multiple different times of creams such as Voltaren gel, icy hot, capsaicin cream without success. She has not tried NSAIDs such as ibuprofen or Aleve.  Given her atrial fibrillation and anticoagulation was Xarelto she is at somewhat increased risk for  bleeding consultations on nonsteroidals and would recommend trying alternative therapies before these medications. She has discussed physical therapy with her doctor in the past but has deferred due to mildness of her symptoms. She is agreeable to trying that today and we will place that referral. Educated the patient on the benefits of increasing core muscle strength in order to shift some of the strain from her back, and she is agreeable to attempting physical therapy exercises as well as core strengthening exercises. I'll follow-up with the patient by phone in 4 weeks to assess how this therapy is working. We can look for alternative methods if she is not having significant relief.  Atrial fibrillation Patient's chronic atrial fibrillation. Currently followed by cardiology. She was last seen on June 27 and had good report. She is compliant with her Xarelto and diltiazem and atenolol therapies. She denies any adverse reactions to these medicines. She does note that she sometimes has  symptomatic palpitations when she gets upset. These typically are self-limited and resolve with control of her emotional symptoms. She denies experiencing chest pain with these episodes.  She was advised if she were to experience chest tightness or chest pain that she should present to the Emergency Department as this may be a sign of myocardial infarction. Patient will follow-up with cardiology. We will continue current therapy.   Signed: Holley Raring, MD 11/28/2015, 3:55 PM  Pager: [phone]

## 2015-11-28 NOTE — Assessment & Plan Note (Signed)
Patient's diabetes is under good control with A1c of 6.0 today. Her A1c has been downtrending over the last year and she continues to report that she is doing well on her diet. She is currently taking apple cider vinegar, honey, and cinnamon before each meal in order to help regulate her appetite and prevent her from overeating. This treatment is working well in helping her maintain her weight loss. I encouraged her to continue to try to eat healthfully and to avoid excess carbohydrates but applauded her on her progress with her sugar management. She is not currently on any pharmacotherapy and I feel that this is appropriate given her A1c today. We will plan to follow-up in 3 months.

## 2015-11-28 NOTE — Assessment & Plan Note (Signed)
Patient reports her primary complaint today is her back pain. She notes that she has significant pain in the thoracic region of her back that sometimes radiates up to the cervical region. She notes that this pain is worse with activity. She describes the pain as aching. She denies any pain in the paraspinal regions. She notes that she has had significant worsening of this over the last 6 months. Back in 2015 she had a MRI which demonstrated some degenerative disc disease. Recently she's been treating this pain with Tylenol that she has been taking 1500 mg daily without success. She reports that she has tried multiple different times of creams such as Voltaren gel, icy hot, capsaicin cream without success. She has not tried NSAIDs such as ibuprofen or Aleve.  Given her atrial fibrillation and anticoagulation was Xarelto she is at somewhat increased risk for bleeding consultations on nonsteroidals and would recommend trying alternative therapies before these medications. She has discussed physical therapy with her doctor in the past but has deferred due to mildness of her symptoms. She is agreeable to trying that today and we will place that referral. Educated the patient on the benefits of increasing core muscle strength in order to shift some of the strain from her back, and she is agreeable to attempting physical therapy exercises as well as core strengthening exercises. I'll follow-up with the patient by phone in 4 weeks to assess how this therapy is working. We can look for alternative methods if she is not having significant relief.

## 2015-12-04 NOTE — Progress Notes (Signed)
Internal Medicine Clinic Attending  I saw and evaluated the patient.  I personally confirmed the key portions of the history and exam documented by Dr. Strelow and I reviewed pertinent patient test results.  The assessment, diagnosis, and plan were formulated together and I agree with the documentation in the resident's note. 

## 2015-12-09 ENCOUNTER — Encounter: Payer: Self-pay | Admitting: *Deleted

## 2016-01-09 NOTE — Addendum Note (Signed)
Addended by: Yvonna Alanis E on: 01/09/2016 11:53 AM   Modules accepted: Orders

## 2016-01-15 ENCOUNTER — Telehealth: Payer: Self-pay

## 2016-01-15 ENCOUNTER — Other Ambulatory Visit: Payer: Self-pay

## 2016-01-15 DIAGNOSIS — I10 Essential (primary) hypertension: Secondary | ICD-10-CM

## 2016-01-15 DIAGNOSIS — I4891 Unspecified atrial fibrillation: Secondary | ICD-10-CM

## 2016-01-15 MED ORDER — CHOLECALCIFEROL 25 MCG (1000 UT) PO CAPS: 1000.0000 [IU] | ORAL_CAPSULE | Freq: Every day | ORAL | 1 refills | Status: DC

## 2016-01-15 MED ORDER — DILTIAZEM HCL ER COATED BEADS 180 MG PO CP24: 180.0000 mg | ORAL_CAPSULE | Freq: Every day | ORAL | 1 refills | Status: DC

## 2016-01-15 NOTE — Telephone Encounter (Signed)
[name] is a 65 y.o. female who was contacted via telephone for monitoring of rivaroxaban (Xarelto) therapy.    ASSESSMENT Indication(s): TIA and Atrial Fibrillation  Duration: indefinite  Labs:    Component Value Date/Time   AST 24 04/03/2015 1531   ALT 24 04/03/2015 1531   NA 141 04/03/2015 1531   K 4.6 04/03/2015 1531   CL 103 04/03/2015 1531   CO2 22 04/03/2015 1531   GLUCOSE 86 04/03/2015 1531   GLUCOSE 80 05/24/2014 1608   HGBA1C 6.0 11/28/2015 1348   HGBA1C 6.3 (H) 07/29/2011 1422   BUN 13 04/03/2015 1531   CREATININE 0.76 04/03/2015 1531   CREATININE 0.80 05/24/2014 1608   CALCIUM 9.7 04/03/2015 1531   GFRNONAA 82 04/03/2015 1531   GFRNONAA 78 05/24/2014 1608   GFRAA 95 04/03/2015 1531   GFRAA 89 05/24/2014 1608   WBC 6.8 04/03/2015 1531   WBC 6.5 04/26/2014 1654   HGB 13.0 04/26/2014 1654   HCT 41.5 04/03/2015 1531   PLT 225 04/03/2015 1531    rivaroxaban (Xarelto) Dose: 20 mg daily   Safety: Patient has had recent bleeding/thromboembolic events. Patient reports no recent signs or symptoms of bleeding, no signs of symptoms of thromboembolism. Medication changes: no.  Renal/hepatic/drug interaction concerns: no.  Adherence: Patient does not correctly recite the dose. Patient reports no known adherence challenges. Contacted pharmacy and records indicate refills are consistent.   Patient Instructions: Patient advised to contact clinic or seek medical attention if signs/symptoms of bleeding or thromboembolism occur. Patient verbalized understanding by repeating back information.  Follow-up Recommended labs to consider: CBC . Next appointment: Unscheduled   Darcella Cheshire PharmD Candidate  01/15/2016, 10:26 AM

## 2016-01-23 ENCOUNTER — Other Ambulatory Visit: Payer: Self-pay | Admitting: Internal Medicine

## 2016-01-23 DIAGNOSIS — E039 Hypothyroidism, unspecified: Secondary | ICD-10-CM

## 2016-01-23 DIAGNOSIS — F32A Depression, unspecified: Secondary | ICD-10-CM

## 2016-01-23 DIAGNOSIS — I10 Essential (primary) hypertension: Secondary | ICD-10-CM

## 2016-01-23 DIAGNOSIS — F329 Major depressive disorder, single episode, unspecified: Secondary | ICD-10-CM

## 2016-01-24 ENCOUNTER — Telehealth: Payer: Self-pay | Admitting: *Deleted

## 2016-01-24 NOTE — Telephone Encounter (Signed)
Call from pt - stated cannot afford Xarelto, cost about $500.00. Stated she called yesterday ( no note in chart) and will out over the weekend. I called Dr Elie Confer who came down to the clinic to see if we had any samples - no samples and being so late on Friday afternoon unable to obtain any. Suggested pt call back on Monday to see what he and/or Mannie Stabile can do for her. I called pt and informed her of the above. Said she does not want to go back on Coumadin and will call back on Monday. I will also forward to Mannie Stabile ( not here today).

## 2016-01-27 ENCOUNTER — Ambulatory Visit: Payer: PPO | Admitting: Pharmacist

## 2016-01-27 DIAGNOSIS — Z79899 Other long term (current) drug therapy: Secondary | ICD-10-CM

## 2016-01-27 NOTE — Progress Notes (Signed)
Medication Samples have been provided to the patient.  Drug: Rivaroxaban Strength: 20mg   Qty: 35  LOT: [id number] Exp.Date: 04/2018 Dosing instructions:  Take 1 tablet PO every evening with a meal   The patient has been instructed regarding the correct time, dose, and frequency of taking this medication, including desired effects and most common side effects.   Samples signed for by Dr. Flossie Dibble, PharmD.   [name] 10:15 AM 01/27/2016

## 2016-01-27 NOTE — Progress Notes (Signed)
S: [name] is a 65 y.o. female reports to clinical pharmacist appointment for medication help.   Allergies  Allergen Reactions  . Gemfibrozil Swelling    REACTION: Angioedema  . Latex Other (See Comments)    Blisters where touched or applied  . Penicillins Hives    Will spread in patches all over the body.  . Adhesive [Tape] Other (See Comments)    Will blister skin where applied - do not use BAND-AIDS.  Marland Kitchen Codeine Hives    Will spread in patches all over the body.  . Digoxin And Related     Makes BP drop  . Metformin And Related Diarrhea  . Omeprazole Diarrhea  . Ace Inhibitors Rash    Cough and rash    Medication Sig  atenolol (TENORMIN) 25 MG tablet TAKE 1 AND 1/2 TABLETS TWICE DAILY  Cholecalciferol ([employer] VITAMIN D3) 1000 units capsule Take 1 capsule (1,000 Units total) by mouth daily.  diltiazem (CARTIA XT) 180 MG 24 hr capsule Take 1 capsule (180 mg total) by mouth daily.  famotidine (PEPCID) 20 MG tablet Take 1 tablet (20 mg total) by mouth daily.  FLUoxetine (PROZAC) 20 MG capsule Take 1 capsule (20 mg total) by mouth daily.  levothyroxine (SYNTHROID, LEVOTHROID) 50 MCG tablet Take 1 tablet (50 mcg total) by mouth every morning.  losartan (COZAAR) 25 MG tablet Take 0.5 tablets (12.5 mg total) by mouth daily.  Probiotic Product (TRUNATURE DIGESTIVE PROBIOTIC PO) Take 1 capsule by mouth daily.  rivaroxaban (XARELTO) 20 MG TABS tablet Take 1 tablet (20 mg total) by mouth daily with supper.  rosuvastatin (CRESTOR) 20 MG tablet TAKE ONE (1) TABLET EACH DAY   Past Medical History:  Diagnosis Date  . Atrial fibrillation ([place])   . Bradycardia, drug induced    08/2011 (dig)  . Bunion   . Carotid stenosis   . Carpal tunnel syndrome    mild   . Cholecystitis    s/p cholecystectomy  . Chronic back pain   . Chronic gastritis   . Depression   . Diabetic peripheral neuropathy ([place])   . Digoxin toxicity    08/2011  . Diverticulitis    dx 04/08/14  . Diverticulosis   .  Drug-induced hypotension    08/2011 (Dig)  . Dysphagia    no documented strictures but responded positively to dilation in past.   . Epileptic seizure, tonic ([place])    .No meds since age of 51.  . Family history of adverse reaction to anesthesia    "my mother may have"  . Fatty liver   . Fibromyalgia   . Fibromyalgia   . GERD (gastroesophageal reflux disease)   . H/O hiatal hernia    S/P hernia repair  . Hyperlipidemia   . Hypertension   . Hypothyroid   . Internal hemorrhoid   . Mild cognitive impairment   . OSA (obstructive sleep apnea) dx'd 2003   "can't sleep w/that mask; lost some weight; maybe that's helped" (04/26/2014)  . Osteoarthritis   . Pneumonia X 2  . Sarcoidosis ([place])   . Skin neoplasm   . TIA (transient ischemic attack)    "don't know when I had it" (04/26/2014)  . Transaminase or LDH elevation   . Type II diabetes mellitus ([place])    Social History   Social History  . Marital status: Divorced    Spouse name: N/A  . Number of children: 2  . Years of education: 12th    Occupational History  .  cosmotologist Disabled   Social History Main Topics  . Smoking status: Never Smoker  . Smokeless tobacco: Never Used  . Alcohol use No  . Drug use: No  . Sexual activity: No   Other Topics Concern  . Not on file   Social History Narrative   Divorced, on disability, has 2 kids (son in [place], daughter in [place]), owns a house in [place], [place] and has a female roommate    No caffeine    Family History  Problem Relation Age of Onset  . Crohn's disease Mother   . Colitis Mother     Crohns  . Anesthesia problems Mother   . Arthritis Mother     rheumatoid; maternal side of family also with RA  . Cancer Father     skin  . Diabetes Father   . Heart disease Father   . Melanoma Father   . Coronary artery disease Father   . Diabetes Sister   . Depression Maternal Uncle   . Dementia Maternal Uncle   . Colon cancer Paternal Uncle     ? if stomach or colon   .  Hypotension Neg Hx   . Malignant hyperthermia Neg Hx   . Pseudochol deficiency Neg Hx    O:    Component Value Date/Time   CHOL 133 04/19/2014 1603   HDL 46 04/19/2014 1603   TRIG 181 (H) 04/19/2014 1603   AST 24 04/03/2015 1531   ALT 24 04/03/2015 1531   NA 141 04/03/2015 1531   K 4.6 04/03/2015 1531   CL 103 04/03/2015 1531   CO2 22 04/03/2015 1531   GLUCOSE 86 04/03/2015 1531   GLUCOSE 80 05/24/2014 1608   HGBA1C 6.0 11/28/2015 1348   HGBA1C 6.3 (H) 07/29/2011 1422   BUN 13 04/03/2015 1531   CREATININE 0.76 04/03/2015 1531   CREATININE 0.80 05/24/2014 1608   CALCIUM 9.7 04/03/2015 1531   GFRAA 95 04/03/2015 1531   GFRAA 89 05/24/2014 1608   WBC 6.8 04/03/2015 1531   WBC 6.5 04/26/2014 1654   HGB 13.0 04/26/2014 1654   HCT 41.5 04/03/2015 1531   PLT 225 04/03/2015 1531   TSH 0.640 08/12/2015 1445   Ht Readings from Last 2 Encounters:  11/28/15 5\' 3"  (1.6 m)  09/03/15 5\' 3"  (1.6 m)   Wt Readings from Last 2 Encounters:  11/28/15 175 lb 14.4 oz (79.8 kg)  09/03/15 175 lb 6.4 oz (79.6 kg)   There is no height or weight on file to calculate BMI. BP Readings from Last 3 Encounters:  11/28/15 128/74  09/03/15 120/80  08/12/15 125/72   A/P: Patient requested information on pain management modalities. Provided patient education on safe acetaminophen dosing and OTC agents. Recommended PCP follow-up for possible consideration of duloxetine, venlafaxine, or amitriptyline.  Medication Samples have been provided to the patient.  Drug name: Xarelto (rivaroxaban)       Strength: 20 mg        Qty: 42  LOT: [id number] Exp.Date: 2/20  Dosing instructions: 1 tablet daily  The patient has been instructed regarding the correct time, dose, and frequency of taking this  medication, including desired effects and most common side effects.   An after visit summary was provided and patient advised to follow up if any changes in condition or questions regarding medications arise.   The  patient verbalized understanding of information provided by repeating back concepts discussed.   15 minutes spent face-to-face with the patient during the encounter. 50% of  time spent on education. 50% of time was spent on assessment and plan.

## 2016-03-30 DIAGNOSIS — M546 Pain in thoracic spine: Secondary | ICD-10-CM | POA: Diagnosis not present

## 2016-04-13 ENCOUNTER — Other Ambulatory Visit: Payer: Self-pay | Admitting: Internal Medicine

## 2016-05-14 ENCOUNTER — Encounter: Payer: Self-pay | Admitting: Internal Medicine

## 2016-05-14 ENCOUNTER — Ambulatory Visit (INDEPENDENT_AMBULATORY_CARE_PROVIDER_SITE_OTHER): Payer: PPO | Admitting: Internal Medicine

## 2016-05-14 VITALS — BP 134/83 | HR 83 | Temp 97.9°F | Wt 189.8 lb

## 2016-05-14 DIAGNOSIS — Z8379 Family history of other diseases of the digestive system: Secondary | ICD-10-CM | POA: Diagnosis not present

## 2016-05-14 DIAGNOSIS — I10 Essential (primary) hypertension: Secondary | ICD-10-CM

## 2016-05-14 DIAGNOSIS — Z808 Family history of malignant neoplasm of other organs or systems: Secondary | ICD-10-CM

## 2016-05-14 DIAGNOSIS — Z818 Family history of other mental and behavioral disorders: Secondary | ICD-10-CM

## 2016-05-14 DIAGNOSIS — Z833 Family history of diabetes mellitus: Secondary | ICD-10-CM

## 2016-05-14 DIAGNOSIS — K219 Gastro-esophageal reflux disease without esophagitis: Secondary | ICD-10-CM | POA: Diagnosis not present

## 2016-05-14 DIAGNOSIS — Z79899 Other long term (current) drug therapy: Secondary | ICD-10-CM

## 2016-05-14 DIAGNOSIS — F329 Major depressive disorder, single episode, unspecified: Secondary | ICD-10-CM

## 2016-05-14 DIAGNOSIS — Z8489 Family history of other specified conditions: Secondary | ICD-10-CM

## 2016-05-14 DIAGNOSIS — M47816 Spondylosis without myelopathy or radiculopathy, lumbar region: Secondary | ICD-10-CM | POA: Diagnosis not present

## 2016-05-14 DIAGNOSIS — I482 Chronic atrial fibrillation, unspecified: Secondary | ICD-10-CM

## 2016-05-14 DIAGNOSIS — Z8249 Family history of ischemic heart disease and other diseases of the circulatory system: Secondary | ICD-10-CM

## 2016-05-14 DIAGNOSIS — Z7901 Long term (current) use of anticoagulants: Secondary | ICD-10-CM | POA: Diagnosis not present

## 2016-05-14 DIAGNOSIS — R51 Headache: Secondary | ICD-10-CM

## 2016-05-14 DIAGNOSIS — F32A Depression, unspecified: Secondary | ICD-10-CM

## 2016-05-14 DIAGNOSIS — I4891 Unspecified atrial fibrillation: Secondary | ICD-10-CM

## 2016-05-14 DIAGNOSIS — Z820 Family history of epilepsy and other diseases of the nervous system: Secondary | ICD-10-CM

## 2016-05-14 DIAGNOSIS — Z8261 Family history of arthritis: Secondary | ICD-10-CM

## 2016-05-14 DIAGNOSIS — E785 Hyperlipidemia, unspecified: Secondary | ICD-10-CM

## 2016-05-14 DIAGNOSIS — E119 Type 2 diabetes mellitus without complications: Secondary | ICD-10-CM | POA: Diagnosis not present

## 2016-05-14 DIAGNOSIS — E1121 Type 2 diabetes mellitus with diabetic nephropathy: Secondary | ICD-10-CM

## 2016-05-14 DIAGNOSIS — Z800 Family history of malignant neoplasm of digestive organs: Secondary | ICD-10-CM

## 2016-05-14 DIAGNOSIS — G44209 Tension-type headache, unspecified, not intractable: Secondary | ICD-10-CM

## 2016-05-14 DIAGNOSIS — R519 Headache, unspecified: Secondary | ICD-10-CM

## 2016-05-14 DIAGNOSIS — E039 Hypothyroidism, unspecified: Secondary | ICD-10-CM

## 2016-05-14 LAB — POCT GLYCOSYLATED HEMOGLOBIN (HGB A1C): Hemoglobin A1C: 7.3

## 2016-05-14 LAB — GLUCOSE, CAPILLARY: GLUCOSE-CAPILLARY: 124 mg/dL — AB (ref 65–99)

## 2016-05-14 MED ORDER — FLUOXETINE HCL 20 MG PO CAPS: ORAL_CAPSULE | ORAL | 0 refills | Status: DC

## 2016-05-14 MED ORDER — FAMOTIDINE 20 MG PO TABS: ORAL_TABLET | ORAL | 0 refills | Status: DC

## 2016-05-14 MED ORDER — CHOLECALCIFEROL 25 MCG (1000 UT) PO CAPS: 1000.0000 [IU] | ORAL_CAPSULE | Freq: Every day | ORAL | 1 refills | Status: DC

## 2016-05-14 MED ORDER — LEVOTHYROXINE SODIUM 50 MCG PO TABS: 50.0000 ug | ORAL_TABLET | Freq: Every morning | ORAL | 0 refills | Status: DC

## 2016-05-14 MED ORDER — RIVAROXABAN 20 MG PO TABS: ORAL_TABLET | ORAL | 0 refills | Status: DC

## 2016-05-14 MED ORDER — DILTIAZEM HCL ER COATED BEADS 180 MG PO CP24: 180.0000 mg | ORAL_CAPSULE | Freq: Every day | ORAL | 1 refills | Status: DC

## 2016-05-14 MED ORDER — LOSARTAN POTASSIUM 25 MG PO TABS: 12.5000 mg | ORAL_TABLET | Freq: Every day | ORAL | 0 refills | Status: DC

## 2016-05-14 MED ORDER — ATENOLOL 25 MG PO TABS: ORAL_TABLET | ORAL | 0 refills | Status: DC

## 2016-05-14 MED ORDER — ROSUVASTATIN CALCIUM 20 MG PO TABS: ORAL_TABLET | ORAL | 0 refills | Status: DC

## 2016-05-14 NOTE — Assessment & Plan Note (Signed)
Patient has a long history of type 2 diabetes mellitus currently on dietary control. Her A1c last fall was 6.0 which is increased 7.3 today. Patient attributes this to dietary and discretion over the holidays as she has deviated from her strict abstinences of sweets. Patient reports that she is not interested in medication therapy as she feels she will be able to successfully improve her blood sugars with diet. She has a proven track record of this and have counseled her on several ways that she can be successful in this.  Assessment: Type 2 diabetes under moderate control with recent deterioration of her A1c  Plan: Intensify dietary modification, avoid medication at this time, follow-up in 3 months repeat A1c.

## 2016-05-14 NOTE — Progress Notes (Signed)
Case discussed with Dr. Gay Filler at the time of the visit.  We reviewed the resident's history and exam and pertinent patient test results.  I agree with the assessment, diagnosis and plan of care documented in the resident's note.

## 2016-05-14 NOTE — Assessment & Plan Note (Signed)
Patient with some chronic back pain which was evaluated by orthopedics. She reports that she was found to have some bone spurs on her vertebra consistent with osteoarthritis. She reports that this pain radiates up and down her spine but does not any radicular symptoms presently with pain in her arms or legs or any paresthesias. She reports that she has been taking Tylenol arthritis for this with mild relief. I previously recommended physical therapy for her complaint of back pain and feel that she would benefit from this but the patient was hesitant due to the fact that she lives in [place] and would have to drive into [place] for therapy which is covered by her insurance. I recommended that she may be able to go to several sessions in order to learn exercises which she can then continue at home and the patient was agreeable to this.  Assessment: Osteoarthritis of the lumbar spine  Plan: Continue over-the-counter remedies Physical therapy, referral placed today

## 2016-05-14 NOTE — Assessment & Plan Note (Signed)
Patient has a long history of headaches which were thought to be secondary to polypharmacy, and had been seen by Dr. Domingo Cocking back in 2015. She reports that she still gets headaches, but describes these as classic tension type headaches which started in her shoulder girdle and neck and radiate upward. She tends to expose these more when she is stressed out or excited. Patient does not have significantly tight muscles on physical exam today, and would not be agreeable to starting a medication with sedating side effects such as muscle relaxer like Flexeril. I recommended that she try topical therapies as well as continuation of her over-the-counter Tylenol.  Assessment: Tension type headache  Plan: Continue over-the-counter therapies Use topical therapy such as icy hot, heating pads, ice Could consider trial of tizanidine if symptoms are not relieved

## 2016-05-14 NOTE — Assessment & Plan Note (Signed)
Patient is a history of depression and is currently on fluoxetine. She denies any current symptoms of low mood or sadness. She has been on fluoxetine for many years and reports that she tolerates this well. Patient reports she does have a "short fuse" and gets irritable fairly easily but she reports that this is not a new problem and is not bothersome for her.  Assessment: Well controlled depression  Plan: Continue fluoxetine 20 mg daily.

## 2016-05-14 NOTE — Patient Instructions (Signed)
Your blood pressure is well controlled. Continue the good work. Your blood sugar measurement with your A1c is slightly higher than last fall. I would recommend that you continue to work on avoiding sweets and maintaining her physical activity in order to control your blood sugars. If your successful with your diet there is no reason to add new medications, we will follow this up at her next visit. We will check your cholesterol level and her thyroid levels today and we'll call you there any abnormalities.  I have placed two referrals today, 1) to physical therapy for you to learn some exercises to help with her back pain, and 2) to ophthalmology for your annual diabetic eye exam.

## 2016-05-14 NOTE — Assessment & Plan Note (Signed)
Patient is a long history of GERD currently on famotidine therapy. She denies any current symptoms of heartburn or reflux. She takes her medication regularly and has no complaints.  Assessment: Well controlled GERD  Plan: Continue famotidine

## 2016-05-14 NOTE — Assessment & Plan Note (Signed)
Patient is a history of hyperlipidemia currently on high intensity statin therapy with rosuvastatin 20 mg daily.  Assessment: Hyperlipidemia, increased atherogenic cardiovascular disease 10 year risk due to hypertension, diabetes, hyperlipidemia.  Plan: Continue rosuvastatin 20 mg, continue dietary modification, continue pharmacotherapy for hypertension

## 2016-05-14 NOTE — Assessment & Plan Note (Signed)
Patient's long history of atrial fibrillation currently following with Dr. Susy Manor in cardiology. She is taking atenolol and diltiazem for rate control as well as Xarelto for anticoagulation. Patient does report some mild mucosal bleeding in her nose which has started over the last several weeks and is only evident on data with tissue. She denies any significant epistaxis. Patient also reports some mild bleeding per rectum which is noticeable on the toilet tissue after bowel movements. She does have a history of constipation and reports some mild straining with bowel movements. I feel that her symptoms are more consistent with mucosal irritation secondary to the dry cold weather, and hemorrhoidal bleeding. Patient denies any significant melena or hematochezia.  Patient does report occasional episodes of rapid heart rate and palpitations, these typically pretty episodes of excitement or irritation. Very rarely she does experience episodes of lightheadedness which she manages by sitting down and resting. She denies any presyncope or syncopal episodes. She follows regularly with Dr. Susy Manor has an appointment scheduled for June of this year.  Patient is an irregularly irregular heartbeat on exam today but is not tachycardic.  Assessment: Well controlled atrial fibrillation  Plan: Continue atenolol, diltiazem for rate control, continue Xarelto for anticoagulations Return impressions given for worsening of her bleeding or signs and symptoms of rapid ventricular response.

## 2016-05-14 NOTE — Assessment & Plan Note (Signed)
Patient is a history of hypothyroidism currently on Synthroid therapy. His medication regularly. She denies any symptoms of hyperthyroidism such as cold intolerance, poor appetite, dry skin, brittle hair. Patient does endorse some palpitations and symptoms of racing heart, however feels these are more consistent with her symptoms of atrial fibrillation. She has no other symptoms of hyperthyroidism.  Her TSH was last checked in June 2016, we will recheck again this year in order to verify therapeutic dosing of her levothyroxine.  Assessment: Hypothyroidism, well controlled  Plan: Continue levothyroxine at 50 g daily, check TSH today, titrate levothyroxine accordingly.

## 2016-05-14 NOTE — Progress Notes (Signed)
CC: f/u HTN and DM HPI: [name] is a 66 y.o. female with a h/o of HTN, hypothyroidism, GERD, chronic a-fib, t2DM, depression who presents for f/u of DM and HTN and other chronic conditions above.  Please see Problem-based charting for HPI and the status of patient's chronic medical conditions.  Past Medical History:  Diagnosis Date  . Atrial fibrillation ([place])   . Bradycardia, drug induced    08/2011 (dig)  . Bunion   . Carotid stenosis   . Carpal tunnel syndrome    mild   . Cholecystitis    s/p cholecystectomy  . Chronic back pain   . Chronic gastritis   . Depression   . Diabetic peripheral neuropathy ([place])   . Digoxin toxicity    08/2011  . Diverticulitis    dx 04/08/14  . Diverticulosis   . Drug-induced hypotension    08/2011 (Dig)  . Dysphagia    no documented strictures but responded positively to dilation in past.   . Epileptic seizure, tonic ([place])    .No meds since age of 56.  . Family history of adverse reaction to anesthesia    "my mother may have"  . Fatty liver   . Fibromyalgia   . Fibromyalgia   . GERD (gastroesophageal reflux disease)   . H/O hiatal hernia    S/P hernia repair  . Hyperlipidemia   . Hypertension   . Hypothyroid   . Internal hemorrhoid   . Mild cognitive impairment   . OSA (obstructive sleep apnea) dx'd 2003   "can't sleep w/that mask; lost some weight; maybe that's helped" (04/26/2014)  . Osteoarthritis   . Pneumonia X 2  . Sarcoidosis ([place])   . Skin neoplasm   . TIA (transient ischemic attack)    "don't know when I had it" (04/26/2014)  . Transaminase or LDH elevation   . Type II diabetes mellitus ([place])    Social History  Substance Use Topics  . Smoking status: Never Smoker  . Smokeless tobacco: Never Used  . Alcohol use No   Family History  Problem Relation Age of Onset  . Crohn's disease Mother   . Colitis Mother     Crohns  . Anesthesia problems Mother   . Arthritis Mother     rheumatoid; maternal side of  family also with RA  . Cancer Father     skin  . Diabetes Father   . Heart disease Father   . Melanoma Father   . Coronary artery disease Father   . Diabetes Sister   . Depression Maternal Uncle   . Dementia Maternal Uncle   . Colon cancer Paternal Uncle     ? if stomach or colon   . Hypotension Neg Hx   . Malignant hyperthermia Neg Hx   . Pseudochol deficiency Neg Hx    Review of Systems: ROS in HPI. Otherwise: Review of Systems  Constitutional: Negative for chills, fever and weight loss.  Eyes: Negative for blurred vision.  Respiratory: Negative for cough and shortness of breath.   Cardiovascular: Positive for palpitations. Negative for chest pain and leg swelling.  Gastrointestinal: Negative for abdominal pain, constipation, diarrhea, nausea and vomiting.  Genitourinary: Negative for dysuria, frequency and urgency.  Musculoskeletal: Positive for back pain.  Neurological: Positive for headaches.  Psychiatric/Behavioral: Negative for depression.   Physical Exam: Vitals:   05/14/16 1402  BP: 134/83  Pulse: 83  Temp: 97.9 F (36.6 C)  TempSrc: Oral  SpO2: 99%  Weight:  189 lb 12.8 oz (86.1 kg)   Physical Exam  Constitutional: She appears well-developed. She is cooperative. No distress.  Cardiovascular: Normal rate, normal heart sounds and normal pulses.  An irregularly irregular rhythm present. Exam reveals no gallop.   No murmur heard. Pulmonary/Chest: Effort normal and breath sounds normal. No respiratory distress. She has no wheezes. She has no rhonchi. She has no rales. Breasts are symmetrical.  Abdominal: Soft. Bowel sounds are normal. There is no tenderness.  Musculoskeletal: She exhibits no edema or tenderness.    Assessment & Plan:  See encounters tab for problem based medical decision making. Patient discussed with Dr. Eppie Gibson  Osteoarthritis Patient with some chronic back pain which was evaluated by orthopedics. She reports that she was found to have some  bone spurs on her vertebra consistent with osteoarthritis. She reports that this pain radiates up and down her spine but does not any radicular symptoms presently with pain in her arms or legs or any paresthesias. She reports that she has been taking Tylenol arthritis for this with mild relief. I previously recommended physical therapy for her complaint of back pain and feel that she would benefit from this but the patient was hesitant due to the fact that she lives in [place] and would have to drive into [place] for therapy which is covered by her insurance. I recommended that she may be able to go to several sessions in order to learn exercises which she can then continue at home and the patient was agreeable to this.  Assessment: Osteoarthritis of the lumbar spine  Plan: Continue over-the-counter remedies Physical therapy, referral placed today  Headache Patient has a long history of headaches which were thought to be secondary to polypharmacy, and had been seen by Dr. Domingo Cocking back in 2015. She reports that she still gets headaches, but describes these as classic tension type headaches which started in her shoulder girdle and neck and radiate upward. She tends to expose these more when she is stressed out or excited. Patient does not have significantly tight muscles on physical exam today, and would not be agreeable to starting a medication with sedating side effects such as muscle relaxer like Flexeril. I recommended that she try topical therapies as well as continuation of her over-the-counter Tylenol.  Assessment: Tension type headache  Plan: Continue over-the-counter therapies Use topical therapy such as icy hot, heating pads, ice Could consider trial of tizanidine if symptoms are not relieved  Hypothyroidism Patient is a history of hypothyroidism currently on Synthroid therapy. His medication regularly. She denies any symptoms of hyperthyroidism such as cold intolerance, poor appetite,  dry skin, brittle hair. Patient does endorse some palpitations and symptoms of racing heart, however feels these are more consistent with her symptoms of atrial fibrillation. She has no other symptoms of hyperthyroidism.  Her TSH was last checked in June 2016, we will recheck again this year in order to verify therapeutic dosing of her levothyroxine.  Assessment: Hypothyroidism, well controlled  Plan: Continue levothyroxine at 50 g daily, check TSH today, titrate levothyroxine accordingly.  Hyperlipidemia Patient is a history of hyperlipidemia currently on high intensity statin therapy with rosuvastatin 20 mg daily.  Assessment: Hyperlipidemia, increased atherogenic cardiovascular disease 10 year risk due to hypertension, diabetes, hyperlipidemia.  Plan: Continue rosuvastatin 20 mg, continue dietary modification, continue pharmacotherapy for hypertension  GERD Patient is a long history of GERD currently on famotidine therapy. She denies any current symptoms of heartburn or reflux. She takes her medication regularly and has  no complaints.  Assessment: Well controlled GERD  Plan: Continue famotidine  Essential hypertension Patient is a long-standing history of well-controlled hypertension currently on multidrug therapy, she's taking atenolol and diltiazem for rate control in light of her atrial fibrillation, as well as losartan 12.5 mg for blood pressure. Her blood pressures well-controlled today 134/83. She denies any headaches, chest pain, shortness of breath.  Assessment: Well-controlled hypertension  Plan: Continue current therapy  Depression Patient is a history of depression and is currently on fluoxetine. She denies any current symptoms of low mood or sadness. She has been on fluoxetine for many years and reports that she tolerates this well. Patient reports she does have a "short fuse" and gets irritable fairly easily but she reports that this is not a new problem and is  not bothersome for her.  Assessment: Well controlled depression  Plan: Continue fluoxetine 20 mg daily.  Diabetes type 2, controlled Patient has a long history of type 2 diabetes mellitus currently on dietary control. Her A1c last fall was 6.0 which is increased 7.3 today. Patient attributes this to dietary and discretion over the holidays as she has deviated from her strict abstinences of sweets. Patient reports that she is not interested in medication therapy as she feels she will be able to successfully improve her blood sugars with diet. She has a proven track record of this and have counseled her on several ways that she can be successful in this.  Assessment: Type 2 diabetes under moderate control with recent deterioration of her A1c  Plan: Intensify dietary modification, avoid medication at this time, follow-up in 3 months repeat A1c.  Atrial fibrillation Patient's long history of atrial fibrillation currently following with Dr. Susy Manor in cardiology. She is taking atenolol and diltiazem for rate control as well as Xarelto for anticoagulation. Patient does report some mild mucosal bleeding in her nose which has started over the last several weeks and is only evident on data with tissue. She denies any significant epistaxis. Patient also reports some mild bleeding per rectum which is noticeable on the toilet tissue after bowel movements. She does have a history of constipation and reports some mild straining with bowel movements. I feel that her symptoms are more consistent with mucosal irritation secondary to the dry cold weather, and hemorrhoidal bleeding. Patient denies any significant melena or hematochezia.  Patient does report occasional episodes of rapid heart rate and palpitations, these typically pretty episodes of excitement or irritation. Very rarely she does experience episodes of lightheadedness which she manages by sitting down and resting. She denies any presyncope or  syncopal episodes. She follows regularly with Dr. Susy Manor has an appointment scheduled for June of this year.  Patient is an irregularly irregular heartbeat on exam today but is not tachycardic.  Assessment: Well controlled atrial fibrillation  Plan: Continue atenolol, diltiazem for rate control, continue Xarelto for anticoagulations Return impressions given for worsening of her bleeding or signs and symptoms of rapid ventricular response.   Signed: Holley Raring, MD 05/14/2016, 3:16 PM  Pager: [phone]

## 2016-05-14 NOTE — Assessment & Plan Note (Signed)
Patient is a long-standing history of well-controlled hypertension currently on multidrug therapy, she's taking atenolol and diltiazem for rate control in light of her atrial fibrillation, as well as losartan 12.5 mg for blood pressure. Her blood pressures well-controlled today 134/83. She denies any headaches, chest pain, shortness of breath.  Assessment: Well-controlled hypertension  Plan: Continue current therapy

## 2016-05-15 ENCOUNTER — Encounter: Payer: Self-pay | Admitting: Internal Medicine

## 2016-05-15 LAB — TSH: TSH: 0.599 u[IU]/mL (ref 0.450–4.500)

## 2016-05-21 ENCOUNTER — Other Ambulatory Visit: Payer: Self-pay | Admitting: Internal Medicine

## 2016-05-21 DIAGNOSIS — Z1231 Encounter for screening mammogram for malignant neoplasm of breast: Secondary | ICD-10-CM

## 2016-05-27 ENCOUNTER — Other Ambulatory Visit: Payer: Self-pay | Admitting: Internal Medicine

## 2016-05-28 ENCOUNTER — Encounter (HOSPITAL_COMMUNITY): Payer: Self-pay

## 2016-05-28 ENCOUNTER — Ambulatory Visit (HOSPITAL_COMMUNITY): Payer: PPO | Attending: Internal Medicine

## 2016-05-28 DIAGNOSIS — M545 Low back pain: Secondary | ICD-10-CM | POA: Diagnosis not present

## 2016-05-28 DIAGNOSIS — M546 Pain in thoracic spine: Secondary | ICD-10-CM

## 2016-05-28 DIAGNOSIS — G8929 Other chronic pain: Secondary | ICD-10-CM | POA: Diagnosis not present

## 2016-05-28 DIAGNOSIS — R29898 Other symptoms and signs involving the musculoskeletal system: Secondary | ICD-10-CM | POA: Diagnosis present

## 2016-05-28 DIAGNOSIS — R293 Abnormal posture: Secondary | ICD-10-CM | POA: Diagnosis present

## 2016-05-28 NOTE — Therapy (Signed)
[facility] [place], [place], [address] Phone: [phone]   Fax:  [phone]  Physical Therapy Evaluation  Patient Details  Name: [name] MRN: [id number] Date of Birth: 03-Oct-1950 Referring Provider: Noah Delaine, MD  Encounter Date: 05/28/2016      PT End of Session - 05/28/16 1555    Visit Number 1   Number of Visits 13   Date for PT Re-Evaluation 06/25/16   Authorization Type [employer]   Authorization Time Period 05/28/2016 to 07/08/16   PT Start Time 1346   PT Stop Time 1431   PT Time Calculation (min) 45 min   Activity Tolerance Patient tolerated treatment well;No increased pain   Behavior During Therapy WFL for tasks assessed/performed      Past Medical History:  Diagnosis Date  . Atrial fibrillation ([place])   . Bradycardia, drug induced    08/2011 (dig)  . Bunion   . Carotid stenosis   . Carpal tunnel syndrome    mild   . Cholecystitis    s/p cholecystectomy  . Chronic back pain   . Chronic gastritis   . Depression   . Diabetic peripheral neuropathy ([place])   . Digoxin toxicity    08/2011  . Diverticulitis    dx 04/08/14  . Diverticulosis   . Drug-induced hypotension    08/2011 (Dig)  . Dysphagia    no documented strictures but responded positively to dilation in past.   . Epileptic seizure, tonic ([place])    .No meds since age of 50.  . Family history of adverse reaction to anesthesia    "my mother may have"  . Fatty liver   . Fibromyalgia   . Fibromyalgia   . GERD (gastroesophageal reflux disease)   . H/O hiatal hernia    S/P hernia repair  . Hyperlipidemia   . Hypertension   . Hypothyroid   . Internal hemorrhoid   . Mild cognitive impairment   . OSA (obstructive sleep apnea) dx'd 2003   "can't sleep w/that mask; lost some weight; maybe that's helped" (04/26/2014)  . Osteoarthritis   . Pneumonia X 2  . Sarcoidosis ([place])   . Skin neoplasm   . TIA (transient ischemic attack)     "don't know when I had it" (04/26/2014)  . Transaminase or LDH elevation   . Type II diabetes mellitus ([place])     Past Surgical History:  Procedure Laterality Date  . ABDOMINAL HYSTERECTOMY  1980's   partial  . BILATERAL OOPHORECTOMY  1980's   "after partial hysterectomy"  . BREAST BIOPSY Right    benign  . BREAST SURGERY Right ~ 2010   excision milk duct;  . CATARACT EXTRACTION W/PHACO  10/27/2010   Procedure: CATARACT EXTRACTION PHACO AND INTRAOCULAR LENS PLACEMENT (IOC);  Surgeon: Williams Che;  Location: [facility];  Service: Ophthalmology;  Laterality: Left;  CDE- 1.78  . CATARACT EXTRACTION W/PHACO  07/13/2011   Procedure: CATARACT EXTRACTION PHACO AND INTRAOCULAR LENS PLACEMENT (IOC);  Surgeon: Williams Che, MD;  Location: [facility];  Service: Ophthalmology;  Laterality: Right;  CDE:  1.65  . DILATION AND CURETTAGE OF UTERUS  1970; 1976  . ESOPHAGEAL DILATION    . ESOPHAGEAL DILATION  multiple  . FOOT SURGERY Left ~ 2011   "straightened toe & scraped bone below big toe"  . FOOT SURGERY Right ~ 2011   "scraped bone of big toe; shortened middle toet"  . HIATAL HERNIA REPAIR  1990   "  had to have scar tissue removed 6 months after repair"  . Inverted nipples  1992  . LAPAROSCOPIC CHOLECYSTECTOMY  1980's  . LIPOSUCTION  1992  . SALPINGOOPHORECTOMY Bilateral    "6 months or so after hysterectomy"  . SKIN CANCER EXCISION  1990's   "front side of right shin"  . TUBAL LIGATION  1976  . VENTRAL HERNIA REPAIR  07/29/2011   Procedure: LAPAROSCOPIC VENTRAL HERNIA;  Surgeon: Adin Hector, MD;  Location: [facility];  Service: General;  Laterality: N/A;  multiple incarcerated hernias with mesh    There were no vitals filed for this visit.       Subjective Assessment - 05/28/16 1350    Subjective Pt states that she has had LBP for at least 10 years, but it has progressively gotten worse over the last 3 years. She mostly has pain in her neck, shoulders and back, but no radicular  symptoms down her BLE. She does have some L foot numbness but she has surgery on her L great toe and that is where she is having her numbness; it does not change with her back symptoms. She denies any b/b changes. She reports sitting is difficult, sit <> stands, and walking depending on her level of pain.   Pertinent History a fib, drug induced bradycardia, drug induced hypotension, DM, peripheral neuropathy, GERD, hypothyroid   Limitations Lifting;Walking;Sitting;House hold activities;Standing   How long can you sit comfortably? 10-15 mins   How long can you stand comfortably? maybe 30 mins   How long can you walk comfortably? 15-20 mins   Patient Stated Goals less back pain   Currently in Pain? 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  No   Is the patient reluctant to leave their home because of a fear of falling?  No     Prior Function   Level of Independence Independent   Vocation Retired   Leisure go to the movies with son     Sensation   Light Touch Appears Intact     Posture/Postural Control    Posture/Postural Control Postural limitations   Postural Limitations Rounded Shoulders;Forward head;Decreased lumbar lordosis;Increased thoracic kyphosis   Posture Comments significant thoracic kyphosis     ROM / Strength   AROM / PROM / Strength AROM;Strength     AROM   Lumbar Flexion mod-max limitations (fingertips to just below knee)  Lumbar Extension min limitations   Lumbar - Right Side Bend fingertips to knee joint line   Lumbar - Left Side Bend fingertips to knee joint line   Thoracic Flexion increased thoracic kyphosis in sitting and standing   Thoracic Extension max limited based off thoracic kyphosis in sitting     Strength   Right Hip Flexion 5/5   Right Hip Extension 4+/5   Right Hip ABduction 4+/5   Left Hip Flexion 5/5   Left Hip Extension 4+/5   Left Hip ABduction 4+/5   Right Knee Flexion 5/5   Right Knee Extension 5/5   Left Knee Flexion 5/5   Left Knee Extension 5/5   Right Ankle Dorsiflexion 5/5   Left Ankle Dorsiflexion 5/5     Flexibility   Soft Tissue Assessment /Muscle Length yes   Hamstrings tight BLE, R>L, did not recreate back pain   Quadriceps +ELy's BLE, R>L, did not recreate LBP   Piriformis WFL     Palpation   Spinal mobility hypomobile and tender to palpation with Grade 1-2 CPAs throughout thoracic > lumbar spine   Palpation comment increased soft tissue restrictions and tenderness to palpation of bil thoracic and lumbar paraspainals, gluteals, and piriformis, R>L throughout     Ambulation/Gait   Ambulation/Gait Yes   Ambulation Distance (Feet) 644 Feet   Assistive device None   Gait Pattern Step-through pattern;Trendelenburg  decr hip ext, decr pelvic control BLE     Standardized Balance Assessment   Standardized Balance Assessment Timed Up and Go Test;Five Times Sit to Stand   Five times sit to stand comments  16.23     Timed Up and Go Test   Normal TUG (seconds) 9.67  no AD             PT Education - 05/28/16 1555     Education provided Yes   Education Details exam findings, POC, HEP   Person(s) Educated Patient   Methods Explanation;Demonstration;Handout   Comprehension Verbalized understanding;Returned demonstration          PT Short Term Goals - 05/28/16 1615      PT SHORT TERM GOAL #1   Title Pt will be independent with HEP and perform consistently to promote return to PLOF and improve participation at home and in the community.   Time 2   Period Weeks   Status New     PT SHORT TERM GOAL #2   Title Pt will have improved 3MWT by >135ft with minimal to no pain to demonstrate improved overall function.   Time 3   Period Weeks   Status New     PT SHORT TERM GOAL #3   Title Pt will be able to attain and maintain proper sitting posture with no cueing 75% of the time or > to decrease pain and demonstrate improved overall function.   Time 3   Period Weeks   Status New           PT Long Term Goals - 05/28/16 1618      PT LONG TERM GOAL #1   Title Pt will be able to walk for >1 hour with minimal to no pain in order to demonstrate improved symptoms and maximize participation in the community.   Time 6   Period Weeks   Status New     PT LONG TERM GOAL #2   Title Pt will be able to vacuum her house without pain to demonstrate improved overall strength, endurance, and function in order  to and maximize participation at home.   Time 6   Period Weeks   Status New     PT LONG TERM GOAL #3   Title Pt will have improved lumbar and thoraic ROM by 50% or greater to help decrease pain, improve posture, and overall function in order to maximize participation at home and in the community.   Time 6   Period Weeks   Status New            Plan - 05/28/16 1557    Clinical Impression Statement Pt is pleasant 65 YO F who presents to OPPT with c/o chronic back pain. She has deficits in posture, soft tissue restrictions, spinal mobility, and overall function as she has difficulty  sitting/standing/walking for long periods of time. Pt tender throughout all thoracic and lumbar spinal mobs and to palpation of thoracic and lumbar paraspinals, glutes, and piriformis, R>L. Pt noted to have deficits in functional core strength based on her gait deviations. Pt given supine HS stretch as part of HEP; had pt perform 1 rep of 30 sec on each leg to ensure proper technique and following pt report 0/10 back pain when she entered with 8-9/10. Pt would benefit from skilled PT intervention to maximize functional strength, improve posture, and decrease pain in order to improve her participation at home and in the community.   Rehab Potential Fair   Clinical Impairments Affecting Rehab Potential (+): young, motivated; (-): chronicity of issue   PT Frequency 2x / week   PT Duration 6 weeks   PT Treatment/Interventions ADLs/Self Care Home Management;Gait training;Stair training;Functional mobility training;Therapeutic activities;Therapeutic exercise;Balance training;Neuromuscular re-education;Patient/family education;Manual techniques;Passive range of motion;Dry needling;Taping   PT Next Visit Plan review eval and goals, manual to thoracic/lumbar and gluteal musculature, spinal mobs if with PT, postural education and strengthening, BLE and core strengthening   PT Home Exercise Plan eval: supine HS stretch with strap   Consulted and Agree with Plan of Care Patient      Patient will benefit from skilled therapeutic intervention in order to improve the following deficits and impairments:  Decreased range of motion, Difficulty walking, Hypomobility, Impaired flexibility, Improper body mechanics, Postural dysfunction, Pain  Visit Diagnosis: Chronic midline low back pain without sciatica  Pain in thoracic spine  Abnormal posture  Other symptoms and signs involving the musculoskeletal system     Problem List Patient Active Problem List   Diagnosis Date Noted  . Diarrhea 04/05/2015  . Left  knee pain 12/19/2014  . Vitamin D deficiency 04/28/2014  . BPPV (benign paroxysmal positional vertigo) 04/26/2014  . Clostridium difficile infection 04/16/2014  . Arthritis, midfoot 08/16/2013  . Muscle spasm of back 08/10/2013  . Right foot pain 08/03/2013  . Poor dentition 04/03/2013  . Encounter for therapeutic drug monitoring 03/31/2013  . Abnormality of gait 07/25/2012  . Headache 02/25/2012  . Chronic back pain 10/07/2011  . Financial difficulties 10/07/2011  . Hemorrhoids 10/06/2011  . Anemia 10/02/2011  . Diverticulosis 10/02/2011  . Precordial pain 09/01/2011  . Nonspecific (abnormal) findings on radiological and other examination of gastrointestinal tract 08/07/2011  . Ventral hernia s/p laparoscopic lysis of adhesions and hernia repair with mesh 07/29/11 08/01/2011  . Joint pain 05/08/2011  . Preventative health care 04/28/2010  . Depression 05/09/2009  . OBESITY, UNSPECIFIED 11/06/2008  . Carotid stenosis 11/06/2008  . FIBROMYALGIA, SEVERE 09/14/2008  . GERD 09/12/2008  . Osteopenia 06/22/2008  . Hyperlipidemia 06/30/2007  . TIA 06/01/2007  . DIABETIC PERIPHERAL NEUROPATHY 04/05/2006  .  Diabetes type 2, controlled ([place]) 03/18/2006  . Hypothyroidism 12/21/2005  . Essential hypertension 12/21/2005  . Atrial fibrillation ([place]) 12/21/2005  . Osteoarthritis 12/21/2005     Geraldine Solar PT, DPT   [facility] [address] [place], [place], [address] Phone: [phone]   Fax:  [phone]  Name: [name] MRN: [id number] Date of Birth: 1951/02/28

## 2016-05-28 NOTE — Patient Instructions (Signed)
  Supine HSS with Strap  Start: Position yourself on your back with a strap or towel placed around your foot.  Movement: Pull on the towel to raise your leg and feel a stretch on the back of the leg  Perform 2-3x/day, 2-3 sets of 30-60 seconds on both legs

## 2016-06-01 DIAGNOSIS — Z961 Presence of intraocular lens: Secondary | ICD-10-CM | POA: Diagnosis not present

## 2016-06-01 DIAGNOSIS — H26493 Other secondary cataract, bilateral: Secondary | ICD-10-CM | POA: Diagnosis not present

## 2016-06-01 DIAGNOSIS — H04123 Dry eye syndrome of bilateral lacrimal glands: Secondary | ICD-10-CM | POA: Diagnosis not present

## 2016-06-01 DIAGNOSIS — E119 Type 2 diabetes mellitus without complications: Secondary | ICD-10-CM | POA: Diagnosis not present

## 2016-06-01 LAB — HM DIABETES EYE EXAM

## 2016-06-01 NOTE — Telephone Encounter (Signed)
Got 3 month supply on 05/14/16. Needs June appt PCP

## 2016-06-02 ENCOUNTER — Ambulatory Visit (HOSPITAL_COMMUNITY): Payer: PPO | Admitting: Physical Therapy

## 2016-06-02 DIAGNOSIS — M545 Low back pain, unspecified: Secondary | ICD-10-CM

## 2016-06-02 DIAGNOSIS — R29898 Other symptoms and signs involving the musculoskeletal system: Secondary | ICD-10-CM

## 2016-06-02 DIAGNOSIS — R293 Abnormal posture: Secondary | ICD-10-CM

## 2016-06-02 DIAGNOSIS — G8929 Other chronic pain: Secondary | ICD-10-CM

## 2016-06-02 DIAGNOSIS — M546 Pain in thoracic spine: Secondary | ICD-10-CM

## 2016-06-02 NOTE — Patient Instructions (Signed)
  Abduction / Adduction: Controlled Motion (Supine)    Move  Leg out slowly and continuously from side to side as keeping stomach muscles tight and not moving any other body part.  Complete 10 reps 2X daily.  Bridge    Lie back, legs bent. Inhale, pressing hips up. Keeping ribs in, lengthen lower back. Exhale, rolling down along spine from top.  Repeat _10___ times. Do _2___ sessions per day.  Abdominal Bracing With Pelvic Floor (Hook-Lying)    With neutral spine, tighten pelvic floor and abdominals. Repeat _10__ times. Do __2_ times a day.

## 2016-06-02 NOTE — Therapy (Signed)
[facility] [place], [place], [address] Phone: [phone]   Fax:  [phone]  Physical Therapy Treatment  Patient Details  Name: [name] MRN: [id number] Date of Birth: 06/21/1950 Referring Provider: Noah Delaine, MD  Encounter Date: 06/02/2016      PT End of Session - 06/02/16 1639    Visit Number 2   Number of Visits 13   Date for PT Re-Evaluation 06/25/16   Authorization Type [employer]   Authorization Time Period 05/28/2016 to 07/08/16   PT Start Time 1608   PT Stop Time 1646   PT Time Calculation (min) 38 min   Activity Tolerance Patient tolerated treatment well;No increased pain   Behavior During Therapy WFL for tasks assessed/performed      Past Medical History:  Diagnosis Date  . Atrial fibrillation ([place])   . Bradycardia, drug induced    08/2011 (dig)  . Bunion   . Carotid stenosis   . Carpal tunnel syndrome    mild   . Cholecystitis    s/p cholecystectomy  . Chronic back pain   . Chronic gastritis   . Depression   . Diabetic peripheral neuropathy ([place])   . Digoxin toxicity    08/2011  . Diverticulitis    dx 04/08/14  . Diverticulosis   . Drug-induced hypotension    08/2011 (Dig)  . Dysphagia    no documented strictures but responded positively to dilation in past.   . Epileptic seizure, tonic ([place])    .No meds since age of 77.  . Family history of adverse reaction to anesthesia    "my mother may have"  . Fatty liver   . Fibromyalgia   . Fibromyalgia   . GERD (gastroesophageal reflux disease)   . H/O hiatal hernia    S/P hernia repair  . Hyperlipidemia   . Hypertension   . Hypothyroid   . Internal hemorrhoid   . Mild cognitive impairment   . OSA (obstructive sleep apnea) dx'd 2003   "can't sleep w/that mask; lost some weight; maybe that's helped" (04/26/2014)  . Osteoarthritis   . Pneumonia X 2  . Sarcoidosis ([place])   . Skin neoplasm   . TIA (transient ischemic attack)     "don't know when I had it" (04/26/2014)  . Transaminase or LDH elevation   . Type II diabetes mellitus ([place])     Past Surgical History:  Procedure Laterality Date  . ABDOMINAL HYSTERECTOMY  1980's   partial  . BILATERAL OOPHORECTOMY  1980's   "after partial hysterectomy"  . BREAST BIOPSY Right    benign  . BREAST SURGERY Right ~ 2010   excision milk duct;  . CATARACT EXTRACTION W/PHACO  10/27/2010   Procedure: CATARACT EXTRACTION PHACO AND INTRAOCULAR LENS PLACEMENT (IOC);  Surgeon: Williams Che;  Location: [facility];  Service: Ophthalmology;  Laterality: Left;  CDE- 1.78  . CATARACT EXTRACTION W/PHACO  07/13/2011   Procedure: CATARACT EXTRACTION PHACO AND INTRAOCULAR LENS PLACEMENT (IOC);  Surgeon: Williams Che, MD;  Location: [facility];  Service: Ophthalmology;  Laterality: Right;  CDE:  1.65  . DILATION AND CURETTAGE OF UTERUS  1970; 1976  . ESOPHAGEAL DILATION    . ESOPHAGEAL DILATION  multiple  . FOOT SURGERY Left ~ 2011   "straightened toe & scraped bone below big toe"  . FOOT SURGERY Right ~ 2011   "scraped bone of big toe; shortened middle toet"  . HIATAL HERNIA REPAIR  1990   "  had to have scar tissue removed 6 months after repair"  . Inverted nipples  1992  . LAPAROSCOPIC CHOLECYSTECTOMY  1980's  . LIPOSUCTION  1992  . SALPINGOOPHORECTOMY Bilateral    "6 months or so after hysterectomy"  . SKIN CANCER EXCISION  1990's   "front side of right shin"  . TUBAL LIGATION  1976  . VENTRAL HERNIA REPAIR  07/29/2011   Procedure: LAPAROSCOPIC VENTRAL HERNIA;  Surgeon: Adin Hector, MD;  Location: [facility];  Service: General;  Laterality: N/A;  multiple incarcerated hernias with mesh    There were no vitals filed for this visit.      Subjective Assessment - 06/02/16 1631    Subjective Pt states she's been doing her stretch but it's kinda hard on the couch.  States she is sleeping on her couch as she does not have mattresses for her bed currently. Little discomfort pain  today (2/10) but had some last night to 7/10.   Currently in Pain? Yes   Pain Score 2    Pain Location Back   Pain Orientation Mid   Pain Descriptors / Indicators Aching;Sore   Pain Radiating Towards radiates up into her shoulders  and cervical region.                         [facility] Adult PT Treatment/Exercise - 06/02/16 0001      Lumbar Exercises: Stretches   Active Hamstring Stretch 2 reps;20 seconds   Active Hamstring Stretch Limitations long sitting     Lumbar Exercises: Supine   Ab Set 10 reps   Clam 10 reps   Bridge 10 reps   Straight Leg Raise 10 reps                PT Education - 06/02/16 1638    Education provided Yes   Education Details reviewed goals per inital evalution and given copy.  Reviewed hamstring stretch and instructed with alteranative ways of completing.  Discussed importance of good support with sleeping and encouraged to purchase mattress for bed and get off of couch.    Person(s) Educated Patient   Methods Explanation;Demonstration;Tactile cues;Verbal cues;Handout   Comprehension Verbalized understanding;Returned demonstration;Verbal cues required;Tactile cues required;Need further instruction          PT Short Term Goals - 05/28/16 1615      PT SHORT TERM GOAL #1   Title Pt will be independent with HEP and perform consistently to promote return to PLOF and improve participation at home and in the community.   Time 2   Period Weeks   Status New     PT SHORT TERM GOAL #2   Title Pt will have improved 3MWT by >121ft with minimal to no pain to demonstrate improved overall function.   Time 3   Period Weeks   Status New     PT SHORT TERM GOAL #3   Title Pt will be able to attain and maintain proper sitting posture with no cueing 75% of the time or > to decrease pain and demonstrate improved overall function.   Time 3   Period Weeks   Status New           PT Long Term Goals - 05/28/16 1618      PT LONG TERM GOAL  #1   Title Pt will be able to walk for >1 hour with minimal to no pain in order to demonstrate improved symptoms and maximize participation in the community.  Time 6   Period Weeks   Status New     PT LONG TERM GOAL #2   Title Pt will be able to vacuum her house without pain to demonstrate improved overall strength, endurance, and function in order to and maximize participation at home.   Time 6   Period Weeks   Status New     PT LONG TERM GOAL #3   Title Pt will have improved lumbar and thoraic ROM by 50% or greater to help decrease pain, improve posture, and overall function in order to maximize participation at home and in the community.   Time 6   Period Weeks   Status New               Plan - 06/02/16 1640    Clinical Impression Statement Reviewed evaluation goals and isntructed with alternative ways of completing hamstirng stretch.  Educated on importance of sleeping in bed (pt is sleeping on couch) and encouraged to purchase a mattress so she could do this.  Began core strengthening exercises this session in supine.  Also added postural strengthening and educated on posture.   Rehab Potential Fair   Clinical Impairments Affecting Rehab Potential (+): young, motivated; (-): chronicity of issue   PT Frequency 2x / week   PT Duration 6 weeks   PT Treatment/Interventions ADLs/Self Care Home Management;Gait training;Stair training;Functional mobility training;Therapeutic activities;Therapeutic exercise;Balance training;Neuromuscular re-education;Patient/family education;Manual techniques;Passive range of motion;Dry needling;Taping   PT Next Visit Plan Manual to thoracic/lumbar and gluteal musculature PRN, spinal mobs if with PT.  continue to educate on  posture and improvement of strength.     PT Home Exercise Plan eval: supine HS stretch with strap  3/27:  abdominal sets, bridge, clams   Consulted and Agree with Plan of Care Patient      Patient will benefit from skilled  therapeutic intervention in order to improve the following deficits and impairments:  Decreased range of motion, Difficulty walking, Hypomobility, Impaired flexibility, Improper body mechanics, Postural dysfunction, Pain  Visit Diagnosis: Chronic midline low back pain without sciatica  Pain in thoracic spine  Abnormal posture  Other symptoms and signs involving the musculoskeletal system     Problem List Patient Active Problem List   Diagnosis Date Noted  . Diarrhea 04/05/2015  . Left knee pain 12/19/2014  . Vitamin D deficiency 04/28/2014  . BPPV (benign paroxysmal positional vertigo) 04/26/2014  . Clostridium difficile infection 04/16/2014  . Arthritis, midfoot 08/16/2013  . Muscle spasm of back 08/10/2013  . Right foot pain 08/03/2013  . Poor dentition 04/03/2013  . Encounter for therapeutic drug monitoring 03/31/2013  . Abnormality of gait 07/25/2012  . Headache 02/25/2012  . Chronic back pain 10/07/2011  . Financial difficulties 10/07/2011  . Hemorrhoids 10/06/2011  . Anemia 10/02/2011  . Diverticulosis 10/02/2011  . Precordial pain 09/01/2011  . Nonspecific (abnormal) findings on radiological and other examination of gastrointestinal tract 08/07/2011  . Ventral hernia s/p laparoscopic lysis of adhesions and hernia repair with mesh 07/29/11 08/01/2011  . Joint pain 05/08/2011  . Preventative health care 04/28/2010  . Depression 05/09/2009  . OBESITY, UNSPECIFIED 11/06/2008  . Carotid stenosis 11/06/2008  . FIBROMYALGIA, SEVERE 09/14/2008  . GERD 09/12/2008  . Osteopenia 06/22/2008  . Hyperlipidemia 06/30/2007  . TIA 06/01/2007  . DIABETIC PERIPHERAL NEUROPATHY 04/05/2006  . Diabetes type 2, controlled ([place]) 03/18/2006  . Hypothyroidism 12/21/2005  . Essential hypertension 12/21/2005  . Atrial fibrillation ([place]) 12/21/2005  . Osteoarthritis 12/21/2005    Amy  Sula Soda, PTA/CLT [id number]  06/02/2016, 4:49 PM  [facility] [address] [place], [place], [address] Phone: [phone]   Fax:  [phone]  Name: ZACARI RADICK MRN: [id number] Date of Birth: 1950/12/10

## 2016-06-04 ENCOUNTER — Ambulatory Visit (HOSPITAL_COMMUNITY): Payer: PPO

## 2016-06-04 DIAGNOSIS — M545 Low back pain, unspecified: Secondary | ICD-10-CM

## 2016-06-04 DIAGNOSIS — M546 Pain in thoracic spine: Secondary | ICD-10-CM

## 2016-06-04 DIAGNOSIS — G8929 Other chronic pain: Secondary | ICD-10-CM

## 2016-06-04 DIAGNOSIS — R293 Abnormal posture: Secondary | ICD-10-CM

## 2016-06-04 DIAGNOSIS — R29898 Other symptoms and signs involving the musculoskeletal system: Secondary | ICD-10-CM

## 2016-06-04 NOTE — Therapy (Signed)
[facility] [place], [place], [address] Phone: [phone]   Fax:  [phone]  Physical Therapy Treatment  Patient Details  Name: [name] MRN: [id number] Date of Birth: Jan 03, 1951 Referring Provider: Noah Delaine, MD  Encounter Date: 06/04/2016      PT End of Session - 06/04/16 1352    Visit Number 3   Number of Visits 13   Date for PT Re-Evaluation 06/25/16   Authorization Type [employer]   Authorization Time Period 05/28/2016 to 07/08/16   PT Start Time 1346   PT Stop Time 1428   PT Time Calculation (min) 42 min   Activity Tolerance Patient tolerated treatment well;No increased pain   Behavior During Therapy WFL for tasks assessed/performed      Past Medical History:  Diagnosis Date  . Atrial fibrillation ([place])   . Bradycardia, drug induced    08/2011 (dig)  . Bunion   . Carotid stenosis   . Carpal tunnel syndrome    mild   . Cholecystitis    s/p cholecystectomy  . Chronic back pain   . Chronic gastritis   . Depression   . Diabetic peripheral neuropathy ([place])   . Digoxin toxicity    08/2011  . Diverticulitis    dx 04/08/14  . Diverticulosis   . Drug-induced hypotension    08/2011 (Dig)  . Dysphagia    no documented strictures but responded positively to dilation in past.   . Epileptic seizure, tonic ([place])    .No meds since age of 58.  . Family history of adverse reaction to anesthesia    "my mother may have"  . Fatty liver   . Fibromyalgia   . Fibromyalgia   . GERD (gastroesophageal reflux disease)   . H/O hiatal hernia    S/P hernia repair  . Hyperlipidemia   . Hypertension   . Hypothyroid   . Internal hemorrhoid   . Mild cognitive impairment   . OSA (obstructive sleep apnea) dx'd 2003   "can't sleep w/that mask; lost some weight; maybe that's helped" (04/26/2014)  . Osteoarthritis   . Pneumonia X 2  . Sarcoidosis ([place])   . Skin neoplasm   . TIA (transient ischemic attack)     "don't know when I had it" (04/26/2014)  . Transaminase or LDH elevation   . Type II diabetes mellitus ([place])     Past Surgical History:  Procedure Laterality Date  . ABDOMINAL HYSTERECTOMY  1980's   partial  . BILATERAL OOPHORECTOMY  1980's   "after partial hysterectomy"  . BREAST BIOPSY Right    benign  . BREAST SURGERY Right ~ 2010   excision milk duct;  . CATARACT EXTRACTION W/PHACO  10/27/2010   Procedure: CATARACT EXTRACTION PHACO AND INTRAOCULAR LENS PLACEMENT (IOC);  Surgeon: Williams Che;  Location: [facility];  Service: Ophthalmology;  Laterality: Left;  CDE- 1.78  . CATARACT EXTRACTION W/PHACO  07/13/2011   Procedure: CATARACT EXTRACTION PHACO AND INTRAOCULAR LENS PLACEMENT (IOC);  Surgeon: Williams Che, MD;  Location: [facility];  Service: Ophthalmology;  Laterality: Right;  CDE:  1.65  . DILATION AND CURETTAGE OF UTERUS  1970; 1976  . ESOPHAGEAL DILATION    . ESOPHAGEAL DILATION  multiple  . FOOT SURGERY Left ~ 2011   "straightened toe & scraped bone below big toe"  . FOOT SURGERY Right ~ 2011   "scraped bone of big toe; shortened middle toet"  . HIATAL HERNIA REPAIR  1990   "  had to have scar tissue removed 6 months after repair"  . Inverted nipples  1992  . LAPAROSCOPIC CHOLECYSTECTOMY  1980's  . LIPOSUCTION  1992  . SALPINGOOPHORECTOMY Bilateral    "6 months or so after hysterectomy"  . SKIN CANCER EXCISION  1990's   "front side of right shin"  . TUBAL LIGATION  1976  . VENTRAL HERNIA REPAIR  07/29/2011   Procedure: LAPAROSCOPIC VENTRAL HERNIA;  Surgeon: Adin Hector, MD;  Location: [facility];  Service: General;  Laterality: N/A;  multiple incarcerated hernias with mesh    There were no vitals filed for this visit.      Subjective Assessment - 06/04/16 1348    Subjective Pt entered dept with migraine and reports of neck pain wiht shooting between her shoulder blades and lower back in center current pain scale 10/10.     Pertinent History a fib, drug induced  bradycardia, drug induced hypotension, DM, peripheral neuropathy, GERD, hypothyroid   Patient Stated Goals less back pain   Currently in Pain? Yes   Pain Score 10-Worst pain ever   Pain Location Back   Pain Orientation Upper;Mid;Lower   Pain Descriptors / Indicators Sharp;Stabbing;Sore   Pain Type Chronic pain   Pain Radiating Towards radiates up into her shoulders and cervical region   Pain Onset More than a month ago   Pain Frequency Constant   Aggravating Factors  sitting, bending   Pain Relieving Factors sitting with support on lower back and improved posture assists.     Effect of Pain on Daily Activities difficulty vaccuming, doing laundry                         [facility] Adult PT Treatment/Exercise - 06/04/16 0001      Lumbar Exercises: Stretches   Active Hamstring Stretch 3 reps;30 seconds   Active Hamstring Stretch Limitations supine with rope   Lower Trunk Rotation Limitations 5x 10" with core activation     Lumbar Exercises: Seated   LAQ on Ball Limitations Discussed importance of proper posture   Hip Flexion on Ball Limitations 3D thoracic excursion for posture     Lumbar Exercises: Supine   Ab Set 10 reps   AB Set Limitations TrA activation with breathing   Bridge 10 reps     Manual Therapy   Manual Therapy Soft tissue mobilization   Manual therapy comments Manual complete separate than rest of tx   Soft tissue mobilization Prone thoracic through lumbar to paraspinals                  PT Short Term Goals - 05/28/16 1615      PT SHORT TERM GOAL #1   Title Pt will be independent with HEP and perform consistently to promote return to PLOF and improve participation at home and in the community.   Time 2   Period Weeks   Status New     PT SHORT TERM GOAL #2   Title Pt will have improved 3MWT by >113ft with minimal to no pain to demonstrate improved overall function.   Time 3   Period Weeks   Status New     PT SHORT TERM GOAL #3    Title Pt will be able to attain and maintain proper sitting posture with no cueing 75% of the time or > to decrease pain and demonstrate improved overall function.   Time 3   Period Weeks   Status New  PT Long Term Goals - 05/28/16 1618      PT LONG TERM GOAL #1   Title Pt will be able to walk for >1 hour with minimal to no pain in order to demonstrate improved symptoms and maximize participation in the community.   Time 6   Period Weeks   Status New     PT LONG TERM GOAL #2   Title Pt will be able to vacuum her house without pain to demonstrate improved overall strength, endurance, and function in order to and maximize participation at home.   Time 6   Period Weeks   Status New     PT LONG TERM GOAL #3   Title Pt will have improved lumbar and thoraic ROM by 50% or greater to help decrease pain, improve posture, and overall function in order to maximize participation at home and in the community.   Time 6   Period Weeks   Status New               Plan - 06/04/16 1411    Clinical Impression Statement Pt limited by high back pain scale with c/o migraine at entrance.  Began session with manual soft tissue mobilization to reduce overall tightness musculature restrictions.  Reports of migraine resolved fillowing manual.  Therex focus on improving core strengthening with cueing for appropriate TrA activation  Added stretches and 3D thoracic excursion to improve thoracic and LE mobility for pain control.  EOS reports pain free with improve gait mechanics noted.     Rehab Potential Fair   Clinical Impairments Affecting Rehab Potential (+): young, motivated; (-): chronicity of issue   PT Frequency 2x / week   PT Duration 6 weeks   PT Treatment/Interventions ADLs/Self Care Home Management;Gait training;Stair training;Functional mobility training;Therapeutic activities;Therapeutic exercise;Balance training;Neuromuscular re-education;Patient/family education;Manual  techniques;Passive range of motion;Dry needling;Taping   PT Next Visit Plan Manual to thoracic/lumbar and gluteal musculature PRN, spinal mobs if with PT.  continue to educate on  posture and improvement of strength.        Patient will benefit from skilled therapeutic intervention in order to improve the following deficits and impairments:  Decreased range of motion, Difficulty walking, Hypomobility, Impaired flexibility, Improper body mechanics, Postural dysfunction, Pain  Visit Diagnosis: Chronic midline low back pain without sciatica  Pain in thoracic spine  Abnormal posture  Other symptoms and signs involving the musculoskeletal system     Problem List Patient Active Problem List   Diagnosis Date Noted  . Diarrhea 04/05/2015  . Left knee pain 12/19/2014  . Vitamin D deficiency 04/28/2014  . BPPV (benign paroxysmal positional vertigo) 04/26/2014  . Clostridium difficile infection 04/16/2014  . Arthritis, midfoot 08/16/2013  . Muscle spasm of back 08/10/2013  . Right foot pain 08/03/2013  . Poor dentition 04/03/2013  . Encounter for therapeutic drug monitoring 03/31/2013  . Abnormality of gait 07/25/2012  . Headache 02/25/2012  . Chronic back pain 10/07/2011  . Financial difficulties 10/07/2011  . Hemorrhoids 10/06/2011  . Anemia 10/02/2011  . Diverticulosis 10/02/2011  . Precordial pain 09/01/2011  . Nonspecific (abnormal) findings on radiological and other examination of gastrointestinal tract 08/07/2011  . Ventral hernia s/p laparoscopic lysis of adhesions and hernia repair with mesh 07/29/11 08/01/2011  . Joint pain 05/08/2011  . Preventative health care 04/28/2010  . Depression 05/09/2009  . OBESITY, UNSPECIFIED 11/06/2008  . Carotid stenosis 11/06/2008  . FIBROMYALGIA, SEVERE 09/14/2008  . GERD 09/12/2008  . Osteopenia 06/22/2008  . Hyperlipidemia 06/30/2007  .  TIA 06/01/2007  . DIABETIC PERIPHERAL NEUROPATHY 04/05/2006  . Diabetes type 2, controlled ([place])  03/18/2006  . Hypothyroidism 12/21/2005  . Essential hypertension 12/21/2005  . Atrial fibrillation ([place]) 12/21/2005  . Osteoarthritis 12/21/2005   Ihor Austin, [place]; [place]  Aldona Lento 06/04/2016, 5:48 PM  [facility] [address] [place], [place], [address] Phone: [phone]   Fax:  [phone]  Name: TAKERIA MARQUINA MRN: [id number] Date of Birth: [birth year]

## 2016-06-08 DIAGNOSIS — H26492 Other secondary cataract, left eye: Secondary | ICD-10-CM | POA: Diagnosis not present

## 2016-06-09 ENCOUNTER — Ambulatory Visit (HOSPITAL_COMMUNITY): Payer: PPO | Attending: Internal Medicine | Admitting: Physical Therapy

## 2016-06-09 DIAGNOSIS — M545 Low back pain: Secondary | ICD-10-CM | POA: Diagnosis not present

## 2016-06-09 DIAGNOSIS — R293 Abnormal posture: Secondary | ICD-10-CM | POA: Diagnosis not present

## 2016-06-09 DIAGNOSIS — R29898 Other symptoms and signs involving the musculoskeletal system: Secondary | ICD-10-CM | POA: Diagnosis not present

## 2016-06-09 DIAGNOSIS — M546 Pain in thoracic spine: Secondary | ICD-10-CM | POA: Diagnosis not present

## 2016-06-09 DIAGNOSIS — G8929 Other chronic pain: Secondary | ICD-10-CM

## 2016-06-09 NOTE — Therapy (Signed)
[place], [place], [address] Phone: [phone]   Fax:  [phone]  Physical Therapy Treatment  Patient Details  Name: SELAM PIETSCH MRN: [id number] Date of Birth: [birth year] Referring Provider: Noah Delaine, MD  Encounter Date: 06/09/2016      PT End of Session - 06/09/16 1445    Visit Number 4   Number of Visits 13   Date for PT Re-Evaluation 06/25/16   Authorization Type [employer]   Authorization Time Period 05/28/2016 to 07/08/16   PT Start Time 1350   PT Stop Time 1430   PT Time Calculation (min) 40 min   Activity Tolerance Patient tolerated treatment well;No increased pain   Behavior During Therapy WFL for tasks assessed/performed      Past Medical History:  Diagnosis Date  . Atrial fibrillation ([place])   . Bradycardia, drug induced    08/2011 (dig)  . Bunion   . Carotid stenosis   . Carpal tunnel syndrome    mild   . Cholecystitis    s/p cholecystectomy  . Chronic back pain   . Chronic gastritis   . Depression   . Diabetic peripheral neuropathy ([place])   . Digoxin toxicity    08/2011  . Diverticulitis    dx 04/08/14  . Diverticulosis   . Drug-induced hypotension    08/2011 (Dig)  . Dysphagia    no documented strictures but responded positively to dilation in past.   . Epileptic seizure, tonic ([place])    .No meds since age of 52.  . Family history of adverse reaction to anesthesia    "my mother may have"  . Fatty liver   . Fibromyalgia   . Fibromyalgia   . GERD (gastroesophageal reflux disease)   . H/O hiatal hernia    S/P hernia repair  . Hyperlipidemia   . Hypertension   . Hypothyroid   . Internal hemorrhoid   . Mild cognitive impairment   . OSA (obstructive sleep apnea) dx'd 2003   "can't sleep w/that mask; lost some weight; maybe that's helped" (04/26/2014)  . Osteoarthritis   . Pneumonia X 2  . Sarcoidosis ([place])   . Skin neoplasm   . TIA (transient ischemic attack)     "don't know when I had it" (04/26/2014)  . Transaminase or LDH elevation   . Type II diabetes mellitus ([place])     Past Surgical History:  Procedure Laterality Date  . ABDOMINAL HYSTERECTOMY  1980's   partial  . BILATERAL OOPHORECTOMY  1980's   "after partial hysterectomy"  . BREAST BIOPSY Right    benign  . BREAST SURGERY Right ~ 2010   excision milk duct;  . CATARACT EXTRACTION W/PHACO  10/27/2010   Procedure: CATARACT EXTRACTION PHACO AND INTRAOCULAR LENS PLACEMENT (IOC);  Surgeon: Williams Che;  Location: [facility];  Service: Ophthalmology;  Laterality: Left;  CDE- 1.78  . CATARACT EXTRACTION W/PHACO  07/13/2011   Procedure: CATARACT EXTRACTION PHACO AND INTRAOCULAR LENS PLACEMENT (IOC);  Surgeon: Williams Che, MD;  Location: [facility];  Service: Ophthalmology;  Laterality: Right;  CDE:  1.65  . DILATION AND CURETTAGE OF UTERUS  1970; 1976  . ESOPHAGEAL DILATION    . ESOPHAGEAL DILATION  multiple  . FOOT SURGERY Left ~ 2011   "straightened toe & scraped bone below big toe"  . FOOT SURGERY Right ~ 2011   "scraped bone of big toe; shortened middle toet"  . HIATAL HERNIA REPAIR  1990   "  had to have scar tissue removed 6 months after repair"  . Inverted nipples  1992  . LAPAROSCOPIC CHOLECYSTECTOMY  1980's  . LIPOSUCTION  1992  . SALPINGOOPHORECTOMY Bilateral    "6 months or so after hysterectomy"  . SKIN CANCER EXCISION  1990's   "front side of right shin"  . TUBAL LIGATION  1976  . VENTRAL HERNIA REPAIR  07/29/2011   Procedure: LAPAROSCOPIC VENTRAL HERNIA;  Surgeon: Adin Hector, MD;  Location: [facility];  Service: General;  Laterality: N/A;  multiple incarcerated hernias with mesh    There were no vitals filed for this visit.      Subjective Assessment - 06/09/16 1357    Subjective Pt states her back is much better, only hurt a little early this morning, at 3/10, and now without pain and has not taken any pain meds.    Currently in Pain? No/denies                          [facility] Adult PT Treatment/Exercise - 06/09/16 0001      Lumbar Exercises: Stretches   Active Hamstring Stretch 3 reps;30 seconds   Active Hamstring Stretch Limitations supine with rope   Passive Hamstring Stretch Limitations .   Lower Trunk Rotation Limitations 5x 10" with core activation     Lumbar Exercises: Seated   Long Arc Quad on [place] 10 reps   Hip Flexion on [employer] 10 reps   Sit to Stand 10 reps   Sit to Stand Limitations no UE's     Lumbar Exercises: Supine   Ab Set 15 reps   AB Set Limitations TrA activation with breathing   Bridge 15 reps     Manual Therapy   Manual Therapy Soft tissue mobilization   Manual therapy comments Manual complete separate than rest of tx   Soft tissue mobilization Prone thoracic through lumbar to paraspinals                  PT Short Term Goals - 05/28/16 1615      PT SHORT TERM GOAL #1   Title Pt will be independent with HEP and perform consistently to promote return to PLOF and improve participation at home and in the community.   Time 2   Period Weeks   Status New     PT SHORT TERM GOAL #2   Title Pt will have improved 3MWT by >116ft with minimal to no pain to demonstrate improved overall function.   Time 3   Period Weeks   Status New     PT SHORT TERM GOAL #3   Title Pt will be able to attain and maintain proper sitting posture with no cueing 75% of the time or > to decrease pain and demonstrate improved overall function.   Time 3   Period Weeks   Status New           PT Long Term Goals - 05/28/16 1618      PT LONG TERM GOAL #1   Title Pt will be able to walk for >1 hour with minimal to no pain in order to demonstrate improved symptoms and maximize participation in the community.   Time 6   Period Weeks   Status New     PT LONG TERM GOAL #2   Title Pt will be able to vacuum her house without pain to demonstrate improved overall strength, endurance, and function in order  to and maximize  participation at home.   Time 6   Period Weeks   Status New     PT LONG TERM GOAL #3   Title Pt will have improved lumbar and thoraic ROM by 50% or greater to help decrease pain, improve posture, and overall function in order to maximize participation at home and in the community.   Time 6   Period Weeks   Status New               Plan - 06/09/16 1446    Clinical Impression Statement continued with core and LE strengthening.  Required cues for proper TrA activivation and general core stability with LE therex.  Able to increase reps this session.  completed manual at end of session in prone position with minimal tightness and spasms palpated in paraspinals.  Pt overall improving.     Rehab Potential Fair   Clinical Impairments Affecting Rehab Potential (+): young, motivated; (-): chronicity of issue   PT Frequency 2x / week   PT Duration 6 weeks   PT Treatment/Interventions ADLs/Self Care Home Management;Gait training;Stair training;Functional mobility training;Therapeutic activities;Therapeutic exercise;Balance training;Neuromuscular re-education;Patient/family education;Manual techniques;Passive range of motion;Dry needling;Taping   PT Next Visit Plan Continue with manual to thoracic/lumbar and gluteal musculature PRN, spinal mobs if with PT.  Begin postural exercises with theraband next session.      Patient will benefit from skilled therapeutic intervention in order to improve the following deficits and impairments:  Decreased range of motion, Difficulty walking, Hypomobility, Impaired flexibility, Improper body mechanics, Postural dysfunction, Pain  Visit Diagnosis: Chronic midline low back pain without sciatica  Pain in thoracic spine  Abnormal posture  Other symptoms and signs involving the musculoskeletal system     Problem List Patient Active Problem List   Diagnosis Date Noted  . Diarrhea 04/05/2015  . Left knee pain 12/19/2014  . Vitamin D  deficiency 04/28/2014  . BPPV (benign paroxysmal positional vertigo) 04/26/2014  . Clostridium difficile infection 04/16/2014  . Arthritis, midfoot 08/16/2013  . Muscle spasm of back 08/10/2013  . Right foot pain 08/03/2013  . Poor dentition 04/03/2013  . Encounter for therapeutic drug monitoring 03/31/2013  . Abnormality of gait 07/25/2012  . Headache 02/25/2012  . Chronic back pain 10/07/2011  . Financial difficulties 10/07/2011  . Hemorrhoids 10/06/2011  . Anemia 10/02/2011  . Diverticulosis 10/02/2011  . Precordial pain 09/01/2011  . Nonspecific (abnormal) findings on radiological and other examination of gastrointestinal tract 08/07/2011  . Ventral hernia s/p laparoscopic lysis of adhesions and hernia repair with mesh 07/29/11 08/01/2011  . Joint pain 05/08/2011  . Preventative health care 04/28/2010  . Depression 05/09/2009  . OBESITY, UNSPECIFIED 11/06/2008  . Carotid stenosis 11/06/2008  . FIBROMYALGIA, SEVERE 09/14/2008  . GERD 09/12/2008  . Osteopenia 06/22/2008  . Hyperlipidemia 06/30/2007  . TIA 06/01/2007  . DIABETIC PERIPHERAL NEUROPATHY 04/05/2006  . Diabetes type 2, controlled ([place]) 03/18/2006  . Hypothyroidism 12/21/2005  . Essential hypertension 12/21/2005  . Atrial fibrillation ([place]) 12/21/2005  . Osteoarthritis 12/21/2005    Teena Irani, PTA/CLT [phone]  06/09/2016, 2:49 PM  [facility] [address] [place], [place], [address] Phone: [phone]   Fax:  [phone]  Name: ARMINDA FOGLIO MRN: [id number] Date of Birth: 01/02/1951

## 2016-06-11 ENCOUNTER — Ambulatory Visit (HOSPITAL_COMMUNITY): Payer: PPO | Admitting: Physical Therapy

## 2016-06-11 DIAGNOSIS — R293 Abnormal posture: Secondary | ICD-10-CM

## 2016-06-11 DIAGNOSIS — M545 Low back pain: Secondary | ICD-10-CM | POA: Diagnosis not present

## 2016-06-11 DIAGNOSIS — M546 Pain in thoracic spine: Secondary | ICD-10-CM

## 2016-06-11 DIAGNOSIS — G8929 Other chronic pain: Secondary | ICD-10-CM

## 2016-06-11 DIAGNOSIS — R29898 Other symptoms and signs involving the musculoskeletal system: Secondary | ICD-10-CM

## 2016-06-11 NOTE — Therapy (Signed)
[place], [place], [address] Phone: [phone]   Fax:  [phone]  Physical Therapy Treatment  Patient Details  Name: [name] MRN: [id number] Date of Birth: 1950-10-07 Referring Provider: Noah Delaine, MD  Encounter Date: 06/11/2016      PT End of Session - 06/11/16 1417    Visit Number 5   Number of Visits 13   Date for PT Re-Evaluation 06/25/16   Authorization Type [employer]   Authorization Time Period 05/28/2016 to 07/08/16   PT Start Time 1347   PT Stop Time 1430   PT Time Calculation (min) 43 min   Activity Tolerance Patient tolerated treatment well;No increased pain   Behavior During Therapy WFL for tasks assessed/performed      Past Medical History:  Diagnosis Date  . Atrial fibrillation ([place])   . Bradycardia, drug induced    08/2011 (dig)  . Bunion   . Carotid stenosis   . Carpal tunnel syndrome    mild   . Cholecystitis    s/p cholecystectomy  . Chronic back pain   . Chronic gastritis   . Depression   . Diabetic peripheral neuropathy ([place])   . Digoxin toxicity    08/2011  . Diverticulitis    dx 04/08/14  . Diverticulosis   . Drug-induced hypotension    08/2011 (Dig)  . Dysphagia    no documented strictures but responded positively to dilation in past.   . Epileptic seizure, tonic ([place])    .No meds since age of 80.  . Family history of adverse reaction to anesthesia    "my mother may have"  . Fatty liver   . Fibromyalgia   . Fibromyalgia   . GERD (gastroesophageal reflux disease)   . H/O hiatal hernia    S/P hernia repair  . Hyperlipidemia   . Hypertension   . Hypothyroid   . Internal hemorrhoid   . Mild cognitive impairment   . OSA (obstructive sleep apnea) dx'd 2003   "can't sleep w/that mask; lost some weight; maybe that's helped" (04/26/2014)  . Osteoarthritis   . Pneumonia X 2  . Sarcoidosis ([place])   . Skin neoplasm   . TIA (transient ischemic attack)     "don't know when I had it" (04/26/2014)  . Transaminase or LDH elevation   . Type II diabetes mellitus ([place])     Past Surgical History:  Procedure Laterality Date  . ABDOMINAL HYSTERECTOMY  1980's   partial  . BILATERAL OOPHORECTOMY  1980's   "after partial hysterectomy"  . BREAST BIOPSY Right    benign  . BREAST SURGERY Right ~ 2010   excision milk duct;  . CATARACT EXTRACTION W/PHACO  10/27/2010   Procedure: CATARACT EXTRACTION PHACO AND INTRAOCULAR LENS PLACEMENT (IOC);  Surgeon: Williams Che;  Location: [facility];  Service: Ophthalmology;  Laterality: Left;  CDE- 1.78  . CATARACT EXTRACTION W/PHACO  07/13/2011   Procedure: CATARACT EXTRACTION PHACO AND INTRAOCULAR LENS PLACEMENT (IOC);  Surgeon: Williams Che, MD;  Location: [facility];  Service: Ophthalmology;  Laterality: Right;  CDE:  1.65  . DILATION AND CURETTAGE OF UTERUS  1970; 1976  . ESOPHAGEAL DILATION    . ESOPHAGEAL DILATION  multiple  . FOOT SURGERY Left ~ 2011   "straightened toe & scraped bone below big toe"  . FOOT SURGERY Right ~ 2011   "scraped bone of big toe; shortened middle toet"  . HIATAL HERNIA REPAIR  1990   "  had to have scar tissue removed 6 months after repair"  . Inverted nipples  1992  . LAPAROSCOPIC CHOLECYSTECTOMY  1980's  . LIPOSUCTION  1992  . SALPINGOOPHORECTOMY Bilateral    "6 months or so after hysterectomy"  . SKIN CANCER EXCISION  1990's   "front side of right shin"  . TUBAL LIGATION  1976  . VENTRAL HERNIA REPAIR  07/29/2011   Procedure: LAPAROSCOPIC VENTRAL HERNIA;  Surgeon: Adin Hector, MD;  Location: [facility];  Service: General;  Laterality: N/A;  multiple incarcerated hernias with mesh    There were no vitals filed for this visit.      Subjective Assessment - 06/11/16 1511    Subjective Pt states she can tell improvements everyday.  States she is currently without pain.  going out of town all next week.   Currently in Pain? No/denies                          [facility] Adult PT Treatment/Exercise - 06/11/16 0001      Lumbar Exercises: Stretches   Active Hamstring Stretch 3 reps;30 seconds   Active Hamstring Stretch Limitations supine with rope   Lower Trunk Rotation Limitations 5x 10" with core activation     Lumbar Exercises: Standing   Scapular Retraction 10 reps;Theraband   Theraband Level (Scapular Retraction) Level 2 (Red)   Row 10 reps;Theraband   Theraband Level (Row) Level 2 (Red)   Shoulder Extension 10 reps;Theraband   Theraband Level (Shoulder Extension) Level 2 (Red)     Lumbar Exercises: Seated   Long Arc Quad on [place] 15 reps   Sit to Stand 15 reps   Sit to Stand Limitations no UE's     Lumbar Exercises: Supine   Ab Set 15 reps   AB Set Limitations TrA activation with breathing   Bridge 15 reps                  PT Short Term Goals - 05/28/16 1615      PT SHORT TERM GOAL #1   Title Pt will be independent with HEP and perform consistently to promote return to PLOF and improve participation at home and in the community.   Time 2   Period Weeks   Status New     PT SHORT TERM GOAL #2   Title Pt will have improved 3MWT by >129ft with minimal to no pain to demonstrate improved overall function.   Time 3   Period Weeks   Status New     PT SHORT TERM GOAL #3   Title Pt will be able to attain and maintain proper sitting posture with no cueing 75% of the time or > to decrease pain and demonstrate improved overall function.   Time 3   Period Weeks   Status New           PT Long Term Goals - 05/28/16 1618      PT LONG TERM GOAL #1   Title Pt will be able to walk for >1 hour with minimal to no pain in order to demonstrate improved symptoms and maximize participation in the community.   Time 6   Period Weeks   Status New     PT LONG TERM GOAL #2   Title Pt will be able to vacuum her house without pain to demonstrate improved overall strength, endurance, and function in order to and maximize  participation at home.   Time 6  Period Weeks   Status New     PT LONG TERM GOAL #3   Title Pt will have improved lumbar and thoraic ROM by 50% or greater to help decrease pain, improve posture, and overall function in order to maximize participation at home and in the community.   Time 6   Period Weeks   Status New               Plan - 06/11/16 1419    Clinical Impression Statement Pt with most c/o discomfort when doing ADLS at home, i.e. washing dishes, cooking, cleaning.  Core stability is still considerably weak as noted by instability with therex.  Introduced postural strengthening with core activation this session with tactile cues needed to isolate and stabilize.  Pt would benefit from body mechanics education to prevent further insult.  Pt without return of pain at end of session today.     Rehab Potential Fair   Clinical Impairments Affecting Rehab Potential (+): young, motivated; (-): chronicity of issue   PT Frequency 2x / week   PT Duration 6 weeks   PT Treatment/Interventions ADLs/Self Care Home Management;Gait training;Stair training;Functional mobility training;Therapeutic activities;Therapeutic exercise;Balance training;Neuromuscular re-education;Patient/family education;Manual techniques;Passive range of motion;Dry needling;Taping   PT Next Visit Plan Continue with manual to thoracic/lumbar and gluteal musculature PRN, spinal mobs if with PT.  Continue to progress with additon of functional strengthening and education on body mechanics.  Begin lunges and squats.      Patient will benefit from skilled therapeutic intervention in order to improve the following deficits and impairments:  Decreased range of motion, Difficulty walking, Hypomobility, Impaired flexibility, Improper body mechanics, Postural dysfunction, Pain  Visit Diagnosis: Chronic midline low back pain without sciatica  Pain in thoracic spine  Abnormal posture  Other symptoms and signs involving  the musculoskeletal system     Problem List Patient Active Problem List   Diagnosis Date Noted  . Diarrhea 04/05/2015  . Left knee pain 12/19/2014  . Vitamin D deficiency 04/28/2014  . BPPV (benign paroxysmal positional vertigo) 04/26/2014  . Clostridium difficile infection 04/16/2014  . Arthritis, midfoot 08/16/2013  . Muscle spasm of back 08/10/2013  . Right foot pain 08/03/2013  . Poor dentition 04/03/2013  . Encounter for therapeutic drug monitoring 03/31/2013  . Abnormality of gait 07/25/2012  . Headache 02/25/2012  . Chronic back pain 10/07/2011  . Financial difficulties 10/07/2011  . Hemorrhoids 10/06/2011  . Anemia 10/02/2011  . Diverticulosis 10/02/2011  . Precordial pain 09/01/2011  . Nonspecific (abnormal) findings on radiological and other examination of gastrointestinal tract 08/07/2011  . Ventral hernia s/p laparoscopic lysis of adhesions and hernia repair with mesh 07/29/11 08/01/2011  . Joint pain 05/08/2011  . Preventative health care 04/28/2010  . Depression 05/09/2009  . OBESITY, UNSPECIFIED 11/06/2008  . Carotid stenosis 11/06/2008  . FIBROMYALGIA, SEVERE 09/14/2008  . GERD 09/12/2008  . Osteopenia 06/22/2008  . Hyperlipidemia 06/30/2007  . TIA 06/01/2007  . DIABETIC PERIPHERAL NEUROPATHY 04/05/2006  . Diabetes type 2, controlled ([place]) 03/18/2006  . Hypothyroidism 12/21/2005  . Essential hypertension 12/21/2005  . Atrial fibrillation ([place]) 12/21/2005  . Osteoarthritis 12/21/2005    Teena Irani, PTA/CLT [phone]  06/11/2016, 3:11 PM  [facility] [address] [place], [place], [address] Phone: [phone]   Fax:  [phone]  Name: KESA BIRKY MRN: [id number] Date of Birth: [birth year]

## 2016-06-16 ENCOUNTER — Encounter (HOSPITAL_COMMUNITY): Payer: PPO

## 2016-06-18 ENCOUNTER — Encounter (HOSPITAL_COMMUNITY): Payer: PPO | Admitting: Physical Therapy

## 2016-06-23 ENCOUNTER — Ambulatory Visit (HOSPITAL_COMMUNITY): Payer: PPO

## 2016-06-23 DIAGNOSIS — M545 Low back pain, unspecified: Secondary | ICD-10-CM

## 2016-06-23 DIAGNOSIS — R29898 Other symptoms and signs involving the musculoskeletal system: Secondary | ICD-10-CM

## 2016-06-23 DIAGNOSIS — M546 Pain in thoracic spine: Secondary | ICD-10-CM

## 2016-06-23 DIAGNOSIS — R293 Abnormal posture: Secondary | ICD-10-CM

## 2016-06-23 DIAGNOSIS — G8929 Other chronic pain: Secondary | ICD-10-CM

## 2016-06-23 NOTE — Therapy (Signed)
[place], [place], [address] Phone: [phone]   Fax:  [phone]  Physical Therapy Treatment  Patient Details  Name: [name] MRN: [id number] Date of Birth: 1950-07-22 Referring Provider: Noah Delaine, MD  Encounter Date: 06/23/2016      PT End of Session - 06/23/16 1354    Visit Number 6   Number of Visits 13   Date for PT Re-Evaluation 06/25/16   Authorization Type [employer]   Authorization Time Period 05/28/2016 to 07/08/16   PT Start Time 1347   PT Stop Time 1430   PT Time Calculation (min) 43 min   Activity Tolerance Patient tolerated treatment well;No increased pain   Behavior During Therapy WFL for tasks assessed/performed      Past Medical History:  Diagnosis Date  . Atrial fibrillation ([place])   . Bradycardia, drug induced    08/2011 (dig)  . Bunion   . Carotid stenosis   . Carpal tunnel syndrome    mild   . Cholecystitis    s/p cholecystectomy  . Chronic back pain   . Chronic gastritis   . Depression   . Diabetic peripheral neuropathy ([place])   . Digoxin toxicity    08/2011  . Diverticulitis    dx 04/08/14  . Diverticulosis   . Drug-induced hypotension    08/2011 (Dig)  . Dysphagia    no documented strictures but responded positively to dilation in past.   . Epileptic seizure, tonic ([place])    .No meds since age of 68.  . Family history of adverse reaction to anesthesia    "my mother may have"  . Fatty liver   . Fibromyalgia   . Fibromyalgia   . GERD (gastroesophageal reflux disease)   . H/O hiatal hernia    S/P hernia repair  . Hyperlipidemia   . Hypertension   . Hypothyroid   . Internal hemorrhoid   . Mild cognitive impairment   . OSA (obstructive sleep apnea) dx'd 2003   "can't sleep w/that mask; lost some weight; maybe that's helped" (04/26/2014)  . Osteoarthritis   . Pneumonia X 2  . Sarcoidosis ([place])   . Skin neoplasm   . TIA (transient ischemic attack)     "don't know when I had it" (04/26/2014)  . Transaminase or LDH elevation   . Type II diabetes mellitus ([place])     Past Surgical History:  Procedure Laterality Date  . ABDOMINAL HYSTERECTOMY  1980's   partial  . BILATERAL OOPHORECTOMY  1980's   "after partial hysterectomy"  . BREAST BIOPSY Right    benign  . BREAST SURGERY Right ~ 2010   excision milk duct;  . CATARACT EXTRACTION W/PHACO  10/27/2010   Procedure: CATARACT EXTRACTION PHACO AND INTRAOCULAR LENS PLACEMENT (IOC);  Surgeon: Williams Che;  Location: [facility];  Service: Ophthalmology;  Laterality: Left;  CDE- 1.78  . CATARACT EXTRACTION W/PHACO  07/13/2011   Procedure: CATARACT EXTRACTION PHACO AND INTRAOCULAR LENS PLACEMENT (IOC);  Surgeon: Williams Che, MD;  Location: [facility];  Service: Ophthalmology;  Laterality: Right;  CDE:  1.65  . DILATION AND CURETTAGE OF UTERUS  1970; 1976  . ESOPHAGEAL DILATION    . ESOPHAGEAL DILATION  multiple  . FOOT SURGERY Left ~ 2011   "straightened toe & scraped bone below big toe"  . FOOT SURGERY Right ~ 2011   "scraped bone of big toe; shortened middle toet"  . HIATAL HERNIA REPAIR  1990   "  had to have scar tissue removed 6 months after repair"  . Inverted nipples  1992  . LAPAROSCOPIC CHOLECYSTECTOMY  1980's  . LIPOSUCTION  1992  . SALPINGOOPHORECTOMY Bilateral    "6 months or so after hysterectomy"  . SKIN CANCER EXCISION  1990's   "front side of right shin"  . TUBAL LIGATION  1976  . VENTRAL HERNIA REPAIR  07/29/2011   Procedure: LAPAROSCOPIC VENTRAL HERNIA;  Surgeon: Adin Hector, MD;  Location: [facility];  Service: General;  Laterality: N/A;  multiple incarcerated hernias with mesh    There were no vitals filed for this visit.      Subjective Assessment - 06/23/16 1349    Subjective Pt reports she flew to [place] last week, reports increased pain center of lower back today.  Reports she is expecting a bed this week, excited to be out of couch at night.     Pertinent  History a fib, drug induced bradycardia, drug induced hypotension, DM, peripheral neuropathy, GERD, hypothyroid   Patient Stated Goals less back pain   Currently in Pain? Yes   Pain Score 7    Pain Location Back   Pain Orientation Lower   Pain Descriptors / Indicators Sore   Pain Type Chronic pain   Pain Radiating Towards no  radicular symptoms today   Pain Onset More than a month ago   Pain Frequency Constant   Aggravating Factors  sitting, bending   Pain Relieving Factors sitting with support on lower back and improved posture assists   Effect of Pain on Daily Activities difficulty vaccuming, doing laundry                         [facility] Adult PT Treatment/Exercise - 06/23/16 0001      Bed Mobility   Bed Mobility Sit to Sidelying Left   Sit to Sidelying Left 5: Supervision   Sit to Sidelying Left Details (indicate cue type and reason) Instructed proper bed mechanics     Lumbar Exercises: Stretches   Active Hamstring Stretch 3 reps;30 seconds   Active Hamstring Stretch Limitations supine with rope   Lower Trunk Rotation Limitations 10x 10" with core activation     Lumbar Exercises: Standing   Functional Squats 10 reps   Forward Lunge 15 reps   Forward Lunge Limitations 6in step no HHA   Scapular Retraction 15 reps;Theraband   Theraband Level (Scapular Retraction) Level 2 (Red)   Row 15 reps;Theraband   Theraband Level (Row) Level 2 (Red)   Shoulder Extension 15 reps;Theraband   Theraband Level (Shoulder Extension) Level 2 (Red)     Lumbar Exercises: Seated   Long Arc Quad on [place] 15 reps   Sit to Stand 15 reps   Sit to Stand Limitations no UE's     Lumbar Exercises: Supine   Bridge 15 reps   Bridge Limitations with TrA activaiton                  PT Short Term Goals - 05/28/16 1615      PT SHORT TERM GOAL #1   Title Pt will be independent with HEP and perform consistently to promote return to PLOF and improve participation at home and in  the community.   Time 2   Period Weeks   Status New     PT SHORT TERM GOAL #2   Title Pt will have improved 3MWT by >133ft with minimal to no pain to demonstrate improved  overall function.   Time 3   Period Weeks   Status New     PT SHORT TERM GOAL #3   Title Pt will be able to attain and maintain proper sitting posture with no cueing 75% of the time or > to decrease pain and demonstrate improved overall function.   Time 3   Period Weeks   Status New           PT Long Term Goals - 05/28/16 1618      PT LONG TERM GOAL #1   Title Pt will be able to walk for >1 hour with minimal to no pain in order to demonstrate improved symptoms and maximize participation in the community.   Time 6   Period Weeks   Status New     PT LONG TERM GOAL #2   Title Pt will be able to vacuum her house without pain to demonstrate improved overall strength, endurance, and function in order to and maximize participation at home.   Time 6   Period Weeks   Status New     PT LONG TERM GOAL #3   Title Pt will have improved lumbar and thoraic ROM by 50% or greater to help decrease pain, improve posture, and overall function in order to maximize participation at home and in the community.   Time 6   Period Weeks   Status New               Plan - 06/23/16 1415    Clinical Impression Statement Reviewed proper form/technique with bed mobility, able to complete and verbalize proper form.  Progressed to functional strengthening with lunges and squats with min cueing for proper form/technique.  Pt continues to display core weakness with increased difficulty with TrA activaiton, continued with core and postural strengthening therex.  EOS pt reports pain reduced to 2/10 lower back pain.     Rehab Potential Fair   Clinical Impairments Affecting Rehab Potential (+): young, motivated; (-): chronicity of issue   PT Frequency 2x / week   PT Duration 6 weeks   PT Treatment/Interventions ADLs/Self Care Home  Management;Gait training;Stair training;Functional mobility training;Therapeutic activities;Therapeutic exercise;Balance training;Neuromuscular re-education;Patient/family education;Manual techniques;Passive range of motion;Dry needling;Taping   PT Next Visit Plan Continue with manual to thoracic/lumbar and gluteal musculature PRN, spinal mobs if with PT.  Continue to progress with additon of functional strengthening and education on body mechanics.  Progress to proper lifting.  Give theraband/posture strengthening if good form next session.     PT Home Exercise Plan eval: supine HS stretch with strap  3/27:  abdominal sets, bridge, clams      Patient will benefit from skilled therapeutic intervention in order to improve the following deficits and impairments:  Decreased range of motion, Difficulty walking, Hypomobility, Impaired flexibility, Improper body mechanics, Postural dysfunction, Pain  Visit Diagnosis: Chronic midline low back pain without sciatica  Pain in thoracic spine  Abnormal posture  Other symptoms and signs involving the musculoskeletal system     Problem List Patient Active Problem List   Diagnosis Date Noted  . Diarrhea 04/05/2015  . Left knee pain 12/19/2014  . Vitamin D deficiency 04/28/2014  . BPPV (benign paroxysmal positional vertigo) 04/26/2014  . Clostridium difficile infection 04/16/2014  . Arthritis, midfoot 08/16/2013  . Muscle spasm of back 08/10/2013  . Right foot pain 08/03/2013  . Poor dentition 04/03/2013  . Encounter for therapeutic drug monitoring 03/31/2013  . Abnormality of gait 07/25/2012  . Headache 02/25/2012  .  Chronic back pain 10/07/2011  . Financial difficulties 10/07/2011  . Hemorrhoids 10/06/2011  . Anemia 10/02/2011  . Diverticulosis 10/02/2011  . Precordial pain 09/01/2011  . Nonspecific (abnormal) findings on radiological and other examination of gastrointestinal tract 08/07/2011  . Ventral hernia s/p laparoscopic lysis of  adhesions and hernia repair with mesh 07/29/11 08/01/2011  . Joint pain 05/08/2011  . Preventative health care 04/28/2010  . Depression 05/09/2009  . OBESITY, UNSPECIFIED 11/06/2008  . Carotid stenosis 11/06/2008  . FIBROMYALGIA, SEVERE 09/14/2008  . GERD 09/12/2008  . Osteopenia 06/22/2008  . Hyperlipidemia 06/30/2007  . TIA 06/01/2007  . DIABETIC PERIPHERAL NEUROPATHY 04/05/2006  . Diabetes type 2, controlled ([place]) 03/18/2006  . Hypothyroidism 12/21/2005  . Essential hypertension 12/21/2005  . Atrial fibrillation ([place]) 12/21/2005  . Osteoarthritis 12/21/2005   Ihor Austin, [place]; [place]  Aldona Lento 06/23/2016, 4:19 PM  [place], [place], [address] Phone: [phone]   Fax:  [phone]  Name: [name] MRN: [id number] Date of Birth: Jul 11, 1950

## 2016-06-25 ENCOUNTER — Ambulatory Visit (HOSPITAL_COMMUNITY): Payer: PPO

## 2016-06-25 DIAGNOSIS — G8929 Other chronic pain: Secondary | ICD-10-CM

## 2016-06-25 DIAGNOSIS — M545 Low back pain, unspecified: Secondary | ICD-10-CM

## 2016-06-25 DIAGNOSIS — M546 Pain in thoracic spine: Secondary | ICD-10-CM

## 2016-06-25 DIAGNOSIS — R29898 Other symptoms and signs involving the musculoskeletal system: Secondary | ICD-10-CM

## 2016-06-25 DIAGNOSIS — R293 Abnormal posture: Secondary | ICD-10-CM

## 2016-06-25 NOTE — Patient Instructions (Addendum)
  Scapular Retraction  Start: Position your arm at 90 degrees by your side with Theraband in hand as pictured.  Movement: Against the resistance of the band, squeeze your shoulder blades together as you stick your chest out. Slow and controlled movement. return to start position.  *Note-you should not be pulling with your arms, this will only round your shoulders. The movement we want her is initiated by your shoulder blades, your arms are just holding the resistance.    ELASTIC BAND SCAPULAR RETRACTIONS WITH MINI SHOULDER EXTENSIONS  While holding an elastic band with both arms in front of you with your elbows straight, squeeze your shoulder blades together as you pull the band back. Be sure your shoulders do not raise up.   ELASTIC BAND HORIZONTAL ABDUCTION  While holding an elastic band in front of you and elbows straight, pull the band outward away from your body as shown.   Y's on Wall with lift-off  Have your pinky's on the wall with your arms spread in the shape of a Y -- slide your arms up the wall and pull arms back overhead -- put pinky's back on wall and slides hands back down to your side.    Thoracic Matrix Rotation Right  In seated position, patient will move slowly through full, pain free range of motion. Full rotation of the trunk with scapular protraction  for mobility and stability of the spine.  Perform everyday with your stretches and rotate to both sides. Do about 10-15 rotations each way holding it for 3-5 seconds   Thoracic Extension   Sit at the edge of the chair. Clasp your hands behind your neck and bring your elbows inside. Move your elbows up and extend your spine backwards.   Do this everyday with your stretching. Perform 10-15 reps holding for 3-5 seconds.   Perform 2-3 sets of 10-15 reps of each of these exercises. Perform your stretching everyday, but pick 3-4 strengthening exercises to do every other day.

## 2016-06-25 NOTE — Therapy (Signed)
[place], [place], [address] Phone: [phone]   Fax:  [phone]  Physical Therapy Treatment/Reassessment  Patient Details  Name: [name] MRN: [id number] Date of Birth: [birth year] Referring Provider: Noah Delaine, MD  Encounter Date: 06/25/2016      PT End of Session - 06/25/16 1345    Visit Number 7   Number of Visits 13   Date for PT Re-Evaluation 06/25/16   Authorization Type [employer]   Authorization Time Period 05/28/2016 to 07/08/16   PT Start Time 1346   PT Stop Time 1426   PT Time Calculation (min) 40 min   Activity Tolerance Patient tolerated treatment well;No increased pain   Behavior During Therapy WFL for tasks assessed/performed      Past Medical History:  Diagnosis Date  . Atrial fibrillation ([place])   . Bradycardia, drug induced    08/2011 (dig)  . Bunion   . Carotid stenosis   . Carpal tunnel syndrome    mild   . Cholecystitis    s/p cholecystectomy  . Chronic back pain   . Chronic gastritis   . Depression   . Diabetic peripheral neuropathy ([place])   . Digoxin toxicity    08/2011  . Diverticulitis    dx 04/08/14  . Diverticulosis   . Drug-induced hypotension    08/2011 (Dig)  . Dysphagia    no documented strictures but responded positively to dilation in past.   . Epileptic seizure, tonic ([place])    .No meds since age of 68.  . Family history of adverse reaction to anesthesia    "my mother may have"  . Fatty liver   . Fibromyalgia   . Fibromyalgia   . GERD (gastroesophageal reflux disease)   . H/O hiatal hernia    S/P hernia repair  . Hyperlipidemia   . Hypertension   . Hypothyroid   . Internal hemorrhoid   . Mild cognitive impairment   . OSA (obstructive sleep apnea) dx'd 2003   "can't sleep w/that mask; lost some weight; maybe that's helped" (04/26/2014)  . Osteoarthritis   . Pneumonia X 2  . Sarcoidosis ([place])   . Skin neoplasm   . TIA (transient  ischemic attack)    "don't know when I had it" (04/26/2014)  . Transaminase or LDH elevation   . Type II diabetes mellitus ([place])     Past Surgical History:  Procedure Laterality Date  . ABDOMINAL HYSTERECTOMY  1980's   partial  . BILATERAL OOPHORECTOMY  1980's   "after partial hysterectomy"  . BREAST BIOPSY Right    benign  . BREAST SURGERY Right ~ 2010   excision milk duct;  . CATARACT EXTRACTION W/PHACO  10/27/2010   Procedure: CATARACT EXTRACTION PHACO AND INTRAOCULAR LENS PLACEMENT (IOC);  Surgeon: Williams Che;  Location: [facility];  Service: Ophthalmology;  Laterality: Left;  CDE- 1.78  . CATARACT EXTRACTION W/PHACO  07/13/2011   Procedure: CATARACT EXTRACTION PHACO AND INTRAOCULAR LENS PLACEMENT (IOC);  Surgeon: Williams Che, MD;  Location: [facility];  Service: Ophthalmology;  Laterality: Right;  CDE:  1.65  . DILATION AND CURETTAGE OF UTERUS  1970; 1976  . ESOPHAGEAL DILATION    . ESOPHAGEAL DILATION  multiple  . FOOT SURGERY Left ~ 2011   "straightened toe & scraped bone below big toe"  . FOOT SURGERY Right ~ 2011   "scraped bone of big toe; shortened middle toet"  . HIATAL HERNIA REPAIR  1990   "  had to have scar tissue removed 6 months after repair"  . Inverted nipples  1992  . LAPAROSCOPIC CHOLECYSTECTOMY  1980's  . LIPOSUCTION  1992  . SALPINGOOPHORECTOMY Bilateral    "6 months or so after hysterectomy"  . SKIN CANCER EXCISION  1990's   "front side of right shin"  . TUBAL LIGATION  1976  . VENTRAL HERNIA REPAIR  07/29/2011   Procedure: LAPAROSCOPIC VENTRAL HERNIA;  Surgeon: Adin Hector, MD;  Location: [facility];  Service: General;  Laterality: N/A;  multiple incarcerated hernias with mesh    There were no vitals filed for this visit.      Subjective Assessment - 06/25/16 1346    Subjective Pt states that she is doing pretty good. she states she has the hardest time remembering how to lift properly.   Pertinent History a fib, drug induced bradycardia, drug  induced hypotension, DM, peripheral neuropathy, GERD, hypothyroid   Patient Stated Goals less back pain   Currently in Pain? No/denies   Pain Onset More than a month ago            [facility] PT Assessment - 06/25/16 0001      AROM   Lumbar Flexion fingertips to just above ankles   Lumbar Extension WNL   Lumbar - Right Side Bend knee joint line   Lumbar - Left Side Bend knee joint line   Thoracic Flexion increased thoracic kyphosis in sitting   Thoracic Extension still limited but improved from initial eval     Ambulation/Gait   Ambulation/Gait Yes   Ambulation Distance (Feet) 804 Feet            PT Education - 06/25/16 1549    Education provided Yes   Education Details updated and provided extension education on HEP to include postural strengthening; sit on stool at bathroom sink to help decrease stress on LB from having to bend over; call next week and let PT know how she is feeling once she gets her new mattress and will likely d/c if not having anymore pain   Person(s) Educated Patient   Methods Explanation;Demonstration;Handout   Comprehension Verbalized understanding;Returned demonstration          PT Short Term Goals - 06/25/16 1349      PT SHORT TERM GOAL #1   Title Pt will be independent with HEP and perform consistently to promote return to PLOF and improve participation at home and in the community.   Time 2   Period Weeks   Status Achieved     PT SHORT TERM GOAL #2   Title Pt will have improved 3MWT by >150f with minimal to no pain to demonstrate improved overall function.   Baseline 4/19: 8023fwith no increase in back pain.   Time 3   Period Weeks   Status Achieved     PT SHORT TERM GOAL #3   Title Pt will be able to attain and maintain proper sitting posture with no cueing 75% of the time or > to decrease pain and demonstrate improved overall function.   Time 3   Period Weeks   Status Achieved           PT Long Term Goals - 06/25/16 1349       PT LONG TERM GOAL #1   Title Pt will be able to walk for >1 hour with minimal to no pain in order to demonstrate improved symptoms and maximize participation in the community.   Time 6  Period Weeks   Status Achieved     PT LONG TERM GOAL #2   Title Pt will be able to vacuum her house without pain to demonstrate improved overall strength, endurance, and function in order to and maximize participation at home.   Baseline 4/19: vacuumed today with no pain   Time 6   Period Weeks   Status Achieved     PT LONG TERM GOAL #3   Title Pt will have improved lumbar and thoraic ROM by 50% or greater to help decrease pain, improve posture, and overall function in order to maximize participation at home and in the community.   Time 6   Period Weeks   Status On-going               Plan - 06/25/16 1551    Clinical Impression Statement PT reassessed pt's outcome measures and goals this session. Pt has made significant progress since starting therapy as demo by her improvements in subjective reports of pain, function at home, and improvements in her 3MWT. Pt reports feeling like she has improved 85% since beginning therapy, stating that the remaining 15% is that she is still having some pain which she attributes to having to sleep on her sofa. She said that her new mattress is being delivered today and feels that will help improve her pain even more. She also stated that the most difficult thing for her to do now is wash her face in her bathroom as the sink is low and she has to lean over pretty far, which increases her LBP. PT educated pt to sit on a stool and then lean forward to wash her face. Simulated this in therapy and she reported decreased symptoms immediately. Pt has met all but one goal as her lumbar and thoracic ROMs have made minimal improvements. PT educated pt to call on Tuesday before her next appointment after having slept on her new mattress and after trying the new technique for  washing her face and report how she feels. If pt has tolerated this well with no pain and has no more complaints, then pt will be d/c'd from therapy at that time. If she is still having pain, then will have pt come in and assess if there are any new modifications that can be made, however, anticipating d/c.   Rehab Potential Fair   Clinical Impairments Affecting Rehab Potential (+): young, motivated; (-): chronicity of issue   PT Frequency 2x / week   PT Duration 6 weeks   PT Treatment/Interventions ADLs/Self Care Home Management;Gait training;Stair training;Functional mobility training;Therapeutic activities;Therapeutic exercise;Balance training;Neuromuscular re-education;Patient/family education;Manual techniques;Passive range of motion;Dry needling;Taping   PT Next Visit Plan Continue with manual to thoracic/lumbar and gluteal musculature PRN, spinal mobs if with PT.  Continue to progress with additon of functional strengthening and education on body mechanics.  Progress to proper lifting.  Give theraband/posture strengthening if good form next session.     PT Home Exercise Plan eval: supine HS stretch with strap  3/27:  abdominal sets, bridge, clams; 4/19: scap retraction, bil low rows, band pulls, Y's on wall with lift off, seated thoracic rotation bil, and seated thoracic extension   Consulted and Agree with Plan of Care Patient      Patient will benefit from skilled therapeutic intervention in order to improve the following deficits and impairments:  Decreased range of motion, Difficulty walking, Hypomobility, Impaired flexibility, Improper body mechanics, Postural dysfunction, Pain  Visit Diagnosis: Chronic midline low back pain without  sciatica  Pain in thoracic spine  Abnormal posture  Other symptoms and signs involving the musculoskeletal system     Problem List Patient Active Problem List   Diagnosis Date Noted  . Diarrhea 04/05/2015  . Left knee pain 12/19/2014  . Vitamin  D deficiency 04/28/2014  . BPPV (benign paroxysmal positional vertigo) 04/26/2014  . Clostridium difficile infection 04/16/2014  . Arthritis, midfoot 08/16/2013  . Muscle spasm of back 08/10/2013  . Right foot pain 08/03/2013  . Poor dentition 04/03/2013  . Encounter for therapeutic drug monitoring 03/31/2013  . Abnormality of gait 07/25/2012  . Headache 02/25/2012  . Chronic back pain 10/07/2011  . Financial difficulties 10/07/2011  . Hemorrhoids 10/06/2011  . Anemia 10/02/2011  . Diverticulosis 10/02/2011  . Precordial pain 09/01/2011  . Nonspecific (abnormal) findings on radiological and other examination of gastrointestinal tract 08/07/2011  . Ventral hernia s/p laparoscopic lysis of adhesions and hernia repair with mesh 07/29/11 08/01/2011  . Joint pain 05/08/2011  . Preventative health care 04/28/2010  . Depression 05/09/2009  . OBESITY, UNSPECIFIED 11/06/2008  . Carotid stenosis 11/06/2008  . FIBROMYALGIA, SEVERE 09/14/2008  . GERD 09/12/2008  . Osteopenia 06/22/2008  . Hyperlipidemia 06/30/2007  . TIA 06/01/2007  . DIABETIC PERIPHERAL NEUROPATHY 04/05/2006  . Diabetes type 2, controlled ([place]) 03/18/2006  . Hypothyroidism 12/21/2005  . Essential hypertension 12/21/2005  . Atrial fibrillation ([place]) 12/21/2005  . Osteoarthritis 12/21/2005     Geraldine Solar PT, DPT   [facility] [address] [place], [place], [address] Phone: [phone]   Fax:  [phone]  Name: [name] MRN: [id number] Date of Birth: Jul 02, 1950

## 2016-06-26 ENCOUNTER — Encounter: Payer: Self-pay | Admitting: *Deleted

## 2016-06-29 ENCOUNTER — Ambulatory Visit
Admission: RE | Admit: 2016-06-29 | Discharge: 2016-06-29 | Disposition: A | Discharge status: Home / Self Care | Payer: PPO | Source: Ambulatory Visit | Attending: Internal Medicine | Admitting: Internal Medicine

## 2016-06-29 DIAGNOSIS — Z1231 Encounter for screening mammogram for malignant neoplasm of breast: Secondary | ICD-10-CM

## 2016-06-30 ENCOUNTER — Telehealth (HOSPITAL_COMMUNITY): Payer: Self-pay

## 2016-06-30 ENCOUNTER — Encounter (HOSPITAL_COMMUNITY): Payer: Self-pay

## 2016-06-30 ENCOUNTER — Ambulatory Visit (HOSPITAL_COMMUNITY): Payer: PPO

## 2016-06-30 NOTE — Telephone Encounter (Signed)
Patient requested to be D/C - she did fine over the weekend.

## 2016-06-30 NOTE — Therapy (Signed)
[place], [place], [address] Phone: [phone]   Fax:  [phone]  Patient Details  Name: [name] MRN: [id number] Date of Birth: 08/01/1950 Referring Provider:  No ref. provider found  Encounter Date: 06/30/2016   PHYSICAL THERAPY DISCHARGE SUMMARY  Visits from Start of Care: 7  Current functional level related to goals / functional outcomes: Pt called and tolerated modifications suggested during last treatment session well and wished to be d/c'd.   Remaining deficits: Pt has continued deficits in thoracic and lumbar ROM.   Education / Equipment: See last treatment note. Plan: Patient agrees to discharge.  Patient goals were met. Patient is being discharged due to being pleased with the current functional level.  ?????       Geraldine Solar PT, [facility] [address] [place], [place], [address] Phone: [phone]   Fax:  [phone]

## 2016-07-02 ENCOUNTER — Ambulatory Visit (HOSPITAL_COMMUNITY): Payer: PPO

## 2016-07-07 ENCOUNTER — Encounter (HOSPITAL_COMMUNITY): Payer: PPO | Admitting: Physical Therapy

## 2016-07-09 ENCOUNTER — Encounter (HOSPITAL_COMMUNITY): Payer: PPO

## 2016-08-14 ENCOUNTER — Encounter: Payer: Self-pay | Admitting: *Deleted

## 2016-08-27 ENCOUNTER — Other Ambulatory Visit: Payer: Self-pay | Admitting: Internal Medicine

## 2016-08-27 ENCOUNTER — Encounter: Payer: Self-pay | Admitting: Internal Medicine

## 2016-08-27 ENCOUNTER — Telehealth: Payer: Self-pay | Admitting: *Deleted

## 2016-08-27 ENCOUNTER — Ambulatory Visit (INDEPENDENT_AMBULATORY_CARE_PROVIDER_SITE_OTHER): Payer: PPO | Admitting: Internal Medicine

## 2016-08-27 VITALS — BP 125/71 | HR 75 | Temp 98.3°F | Ht 63.0 in | Wt 184.5 lb

## 2016-08-27 DIAGNOSIS — Z8379 Family history of other diseases of the digestive system: Secondary | ICD-10-CM

## 2016-08-27 DIAGNOSIS — M549 Dorsalgia, unspecified: Secondary | ICD-10-CM

## 2016-08-27 DIAGNOSIS — Z833 Family history of diabetes mellitus: Secondary | ICD-10-CM

## 2016-08-27 DIAGNOSIS — E119 Type 2 diabetes mellitus without complications: Secondary | ICD-10-CM

## 2016-08-27 DIAGNOSIS — F32A Depression, unspecified: Secondary | ICD-10-CM

## 2016-08-27 DIAGNOSIS — Z8261 Family history of arthritis: Secondary | ICD-10-CM | POA: Diagnosis not present

## 2016-08-27 DIAGNOSIS — I482 Chronic atrial fibrillation, unspecified: Secondary | ICD-10-CM

## 2016-08-27 DIAGNOSIS — F329 Major depressive disorder, single episode, unspecified: Secondary | ICD-10-CM | POA: Diagnosis not present

## 2016-08-27 DIAGNOSIS — G8929 Other chronic pain: Secondary | ICD-10-CM | POA: Diagnosis not present

## 2016-08-27 DIAGNOSIS — E1121 Type 2 diabetes mellitus with diabetic nephropathy: Secondary | ICD-10-CM

## 2016-08-27 DIAGNOSIS — E039 Hypothyroidism, unspecified: Secondary | ICD-10-CM | POA: Diagnosis not present

## 2016-08-27 DIAGNOSIS — I480 Paroxysmal atrial fibrillation: Secondary | ICD-10-CM | POA: Diagnosis not present

## 2016-08-27 DIAGNOSIS — Z800 Family history of malignant neoplasm of digestive organs: Secondary | ICD-10-CM

## 2016-08-27 DIAGNOSIS — I10 Essential (primary) hypertension: Secondary | ICD-10-CM

## 2016-08-27 DIAGNOSIS — Z818 Family history of other mental and behavioral disorders: Secondary | ICD-10-CM

## 2016-08-27 DIAGNOSIS — Z808 Family history of malignant neoplasm of other organs or systems: Secondary | ICD-10-CM

## 2016-08-27 DIAGNOSIS — Z8249 Family history of ischemic heart disease and other diseases of the circulatory system: Secondary | ICD-10-CM

## 2016-08-27 DIAGNOSIS — Z0000 Encounter for general adult medical examination without abnormal findings: Secondary | ICD-10-CM

## 2016-08-27 DIAGNOSIS — K219 Gastro-esophageal reflux disease without esophagitis: Secondary | ICD-10-CM

## 2016-08-27 DIAGNOSIS — M545 Low back pain, unspecified: Secondary | ICD-10-CM

## 2016-08-27 LAB — GLUCOSE, CAPILLARY: Glucose-Capillary: 111 mg/dL — ABNORMAL HIGH (ref 65–99)

## 2016-08-27 LAB — POCT GLYCOSYLATED HEMOGLOBIN (HGB A1C): Hemoglobin A1C: 6.4

## 2016-08-27 MED ORDER — LEVOTHYROXINE SODIUM 50 MCG PO TABS: 50.0000 ug | ORAL_TABLET | Freq: Every morning | ORAL | 0 refills | Status: DC

## 2016-08-27 MED ORDER — RIVAROXABAN 20 MG PO TABS
ORAL_TABLET | ORAL | 0 refills | Status: DC
Start: 2016-08-27 — End: 2017-04-15

## 2016-08-27 MED ORDER — TRAMADOL HCL 50 MG PO TABS: 50.0000 mg | ORAL_TABLET | Freq: Every day | ORAL | 0 refills | Status: DC | PRN

## 2016-08-27 MED ORDER — FLUOXETINE HCL 20 MG PO TABS: 20.0000 mg | ORAL_TABLET | Freq: Every day | ORAL | 3 refills | Status: DC

## 2016-08-27 MED ORDER — ROSUVASTATIN CALCIUM 20 MG PO TABS: ORAL_TABLET | ORAL | 0 refills | Status: DC

## 2016-08-27 MED ORDER — FAMOTIDINE 20 MG PO TABS: ORAL_TABLET | ORAL | 0 refills | Status: DC

## 2016-08-27 NOTE — Assessment & Plan Note (Signed)
Patient has had pneumococcal 23 vaccination but has not had Prevnar 13 vaccination. She is due for this today, this was offered but the patient preferred to wait until her next visit. Please follow-up in 3 months

## 2016-08-27 NOTE — Assessment & Plan Note (Signed)
Patient reports that her depression is well controlled on fluoxetine. She requests a refill of this medication today. Patient reports that her mood has been good and she anticipates possibility of marriage to her partner of 2 years in the next several months. Associated with this marriage the patient may be moving to [facility] and constipation that she should contact our office for any needs of transfer of medical records. Patient does not wish to attempt to taper of her antidepressant medication regimen at this time.  Assessment: Well controlled depression  Plan: Continue fluoxetine 20 mg daily.

## 2016-08-27 NOTE — Progress Notes (Signed)
Internal Medicine Clinic Attending  Case discussed with Dr. Strelow at the time of the visit.  We reviewed the resident's history and exam and pertinent patient test results.  I agree with the assessment, diagnosis, and plan of care documented in the resident's note.  

## 2016-08-27 NOTE — Assessment & Plan Note (Addendum)
Patient diabetes is under good control currently. Patient denies any polyuria or polydipsia. Her A1c today is 6.4 which is improved from 7.3 during last check. Patient has returned to her strict dietary control and continues to be compliant with this without the need for pharmacotherapy at this time.  Assessment: Well controlled diabetes with dietary and lifestyle medications  Plan: Continue dietary and lifestyle modifications, recheck A1c in 3 months.

## 2016-08-27 NOTE — Assessment & Plan Note (Signed)
Patient is stable and her hypothyroidism currently on Synthroid 50 g daily. Last TSH check was within normal limits. Patient denies any symptoms of hyper or hypothyroid at this time.  Assessment: Stable hypothyroidism  Plan: Continue Synthroid 50 g, recheck TSH in 9 months

## 2016-08-27 NOTE — Patient Instructions (Signed)
It was a pleasure to see you! Keep up the good work with your diabetes, I am very excited to see the recent improvement in your A1c.  I have provided samples for the Xarelto, please let us know if you are going to run out.  I would recommend adding a low dose of Aleve or Ibuprofen for your arthritis pain, twice daily, you can continue to take Tylenol as need for this as well. Please note that any changes to your stool such as red blood or dark black stools may be signs of bleeding and you should contact the clinic for further evaluation.  I will also provide a short supply of Tramadol for your back pain to be used as needed for severe pain.

## 2016-08-27 NOTE — Assessment & Plan Note (Signed)
Patient's chronic back pain related to osteoarthritis titrated on plain film radiography patient reports that this pain is recently gotten worse and occasionally flares. She request on the medication than the Tylenol arthritis she has been using daily. Recommend the patient try Aleve or ibuprofen for these symptoms and to limit her therapy to several days to avoid the risk of gastric ulcer or bleeding. We will also repeat BMP testing to ensure no concern for worsened renal function. I will also provide the patient with a prescription of tramadol 50 mg to use once daily as needed for severe pain when she has exacerbations of her arthritic symptoms and findings to be limiting her activities of daily living. Patient is very reliable and I do not have concerns about misuse of this medication presently. Side effects of this medication including its sedating effects were explained.

## 2016-08-27 NOTE — Progress Notes (Signed)
CC: 1 arthritis, hypertension, atrial fibrillation HPI: [name] is a 66 y.o. female with a h/o of HTN, hypothyroidism, GERD, chronic a-fib, t2DM, depression who presents for f/u of DM and HTN and other chronic conditions above.  Please see Problem-based charting for HPI and the status of patient's chronic medical conditions.  Past Medical History:  Diagnosis Date  . Atrial fibrillation ([place])   . Bradycardia, drug induced    08/2011 (dig)  . Bunion   . Carotid stenosis   . Carpal tunnel syndrome    mild   . Cholecystitis    s/p cholecystectomy  . Chronic back pain   . Chronic gastritis   . Depression   . Diabetic peripheral neuropathy ([place])   . Digoxin toxicity    08/2011  . Diverticulitis    dx 04/08/14  . Diverticulosis   . Drug-induced hypotension    08/2011 (Dig)  . Dysphagia    no documented strictures but responded positively to dilation in past.   . Epileptic seizure, tonic ([place])    .No meds since age of 74.  . Family history of adverse reaction to anesthesia    "my mother may have"  . Fatty liver   . Fibromyalgia   . Fibromyalgia   . GERD (gastroesophageal reflux disease)   . H/O hiatal hernia    S/P hernia repair  . Hyperlipidemia   . Hypertension   . Hypothyroid   . Internal hemorrhoid   . Mild cognitive impairment   . OSA (obstructive sleep apnea) dx'd 2003   "can't sleep w/that mask; lost some weight; maybe that's helped" (04/26/2014)  . Osteoarthritis   . Pneumonia X 2  . Sarcoidosis   . Skin neoplasm   . TIA (transient ischemic attack)    "don't know when I had it" (04/26/2014)  . Transaminase or LDH elevation   . Type II diabetes mellitus ([place])    Social History  Substance Use Topics  . Smoking status: Never Smoker  . Smokeless tobacco: Never Used  . Alcohol use No   Family History  Problem Relation Age of Onset  . Crohn's disease Mother   . Colitis Mother        Crohns  . Anesthesia problems Mother   . Arthritis Mother     rheumatoid; maternal side of family also with RA  . Cancer Father        skin  . Diabetes Father   . Heart disease Father   . Melanoma Father   . Coronary artery disease Father   . Diabetes Sister   . Depression Maternal Uncle   . Dementia Maternal Uncle   . Colon cancer Paternal Uncle        ? if stomach or colon   . Hypotension Neg Hx   . Malignant hyperthermia Neg Hx   . Pseudochol deficiency Neg Hx    Review of Systems: ROS in HPI. Otherwise: Review of Systems  Constitutional: Negative for chills, fever and weight loss.  Respiratory: Negative for cough and shortness of breath.   Cardiovascular: Negative for chest pain and leg swelling.  Gastrointestinal: Negative for abdominal pain, constipation, diarrhea, nausea and vomiting.  Genitourinary: Negative for dysuria, frequency and urgency.  Musculoskeletal: Positive for back pain.   Physical Exam: Vitals:   08/27/16 1320  BP: 125/71  Pulse: 75  Temp: 98.3 F (36.8 C)  TempSrc: Oral  SpO2: 97%  Weight: 184 lb 8 oz (83.7 kg)  Height: 5\' 3"  (1.6  m)   Physical Exam  Constitutional: She appears well-developed. She is cooperative. No distress.  Cardiovascular: Normal rate, regular rhythm, normal heart sounds and normal pulses.  Exam reveals no gallop.   No murmur heard. Pulmonary/Chest: Effort normal and breath sounds normal. No respiratory distress. She has no wheezes. She has no rhonchi. She has no rales. Breasts are symmetrical.  Abdominal: Soft. Bowel sounds are normal. There is no tenderness.  Musculoskeletal: She exhibits no edema.    Assessment & Plan:  See encounters tab for problem based medical decision making. Patient discussed with Dr. Evette Doffing  Atrial fibrillation Patient nurses will A. fib, in normal sinus today. She denies any palpitations or symptoms of chest pain, dizziness, lightheadedness. She is compliant with her medications.  Assessment: Stable paroxysmal A. fib currently in normal  sinus  Plan: Continue its regulation with Xarelto, rate control with diltiazem.  Chronic back pain Patient's chronic back pain related to osteoarthritis titrated on plain film radiography patient reports that this pain is recently gotten worse and occasionally flares. She request on the medication than the Tylenol arthritis she has been using daily. Recommend the patient try Aleve or ibuprofen for these symptoms and to limit her therapy to several days to avoid the risk of gastric ulcer or bleeding. We will also repeat BMP testing to ensure no concern for worsened renal function. I will also provide the patient with a prescription of tramadol 50 mg to use once daily as needed for severe pain when she has exacerbations of her arthritic symptoms and findings to be limiting her activities of daily living. Patient is very reliable and I do not have concerns about misuse of this medication presently. Side effects of this medication including its sedating effects were explained.  Diabetes type 2, controlled Patient diabetes is under good control currently. Patient denies any polyuria or polydipsia. Her A1c today is 6.4 which is improved from 7.3 during last check. Patient has returned to her strict dietary control and continues to be compliant with this without the need for pharmacotherapy at this time.  Assessment: Well controlled diabetes with dietary and lifestyle medications  Plan: Continue dietary and lifestyle modifications, recheck A1c in 3 months.  Depression Patient reports that her depression is well controlled on fluoxetine. She requests a refill of this medication today. Patient reports that her mood has been good and she anticipates possibility of marriage to her partner of 2 years in the next several months. Associated with this marriage the patient may be moving to [facility] and constipation that she should contact our office for any needs of transfer of medical records. Patient does not  wish to attempt to taper of her antidepressant medication regimen at this time.  Assessment: Well controlled depression  Plan: Continue fluoxetine 20 mg daily.  Hypothyroidism Patient is stable and her hypothyroidism currently on Synthroid 50 g daily. Last TSH check was within normal limits. Patient denies any symptoms of hyper or hypothyroid at this time.  Assessment: Stable hypothyroidism  Plan: Continue Synthroid 50 g, recheck TSH in 9 months  Healthcare maintenance Patient has had pneumococcal 23 vaccination but has not had Prevnar 13 vaccination. She is due for this today, this was offered but the patient preferred to wait until her next visit. Please follow-up in 3 months   Signed: Holley Raring, MD 08/27/2016, 3:37 PM  Pager: [phone]

## 2016-08-27 NOTE — Assessment & Plan Note (Signed)
Patient nurses will A. fib, in normal sinus today. She denies any palpitations or symptoms of chest pain, dizziness, lightheadedness. She is compliant with her medications.  Assessment: Stable paroxysmal A. fib currently in normal sinus  Plan: Continue its regulation with Xarelto, rate control with diltiazem.

## 2016-08-27 NOTE — Telephone Encounter (Signed)
Prescription for Tramadol 50 mg tablets # 30 was called to [facility] in [place], [place]. C.  Take 1 tablet( 50 mg total) by mouth daily as needed per order of Dr. Gay Filler .  Sander Nephew, RN 08/27/2016 4:36 PM.

## 2016-08-28 LAB — BMP8+ANION GAP
Anion Gap: 16 mmol/L (ref 10.0–18.0)
BUN / CREAT RATIO: 22 (ref 12–28)
BUN: 19 mg/dL (ref 8–27)
CALCIUM: 9.6 mg/dL (ref 8.7–10.3)
CO2: 22 mmol/L (ref 20–29)
Chloride: 104 mmol/L (ref 96–106)
Creatinine, Ser: 0.85 mg/dL (ref 0.57–1.00)
GFR calc Af Amer: 81 mL/min/{1.73_m2} (ref >59)
GFR, EST NON AFRICAN AMERICAN: 71 mL/min/{1.73_m2} (ref >59)
Glucose: 90 mg/dL (ref 65–99)
Potassium: 4.1 mmol/L (ref 3.5–5.2)
Sodium: 142 mmol/L (ref 134–144)

## 2016-08-31 ENCOUNTER — Encounter: Payer: Self-pay | Admitting: Internal Medicine

## 2016-09-07 ENCOUNTER — Encounter: Payer: Self-pay | Admitting: Pharmacist

## 2016-09-07 ENCOUNTER — Other Ambulatory Visit: Payer: Self-pay | Admitting: Internal Medicine

## 2016-09-07 DIAGNOSIS — I482 Chronic atrial fibrillation, unspecified: Secondary | ICD-10-CM

## 2016-09-07 NOTE — Progress Notes (Signed)
[name] is a 66 y.o. female who was reviewed for monitoring of rivaroxaban (Xarelto) therapy.    ASSESSMENT Indication(s): atrial fibrillation CHA2DS2VASC score 7, HAS-BLED 2 Duration: indefinite  Labs: Component Value Date/Time   AST 24 04/03/2015 1531   ALT 24 04/03/2015 1531   NA 142 08/27/2016 1432   K 4.1 08/27/2016 1432   CL 104 08/27/2016 1432   CO2 22 08/27/2016 1432   GLUCOSE 90 08/27/2016 1432   GLUCOSE 80 05/24/2014 1608   HGBA1C 6.4 08/27/2016 1342   HGBA1C 6.3 (H) 07/29/2011 1422   BUN 19 08/27/2016 1432   CREATININE 0.85 08/27/2016 1432   CREATININE 0.80 05/24/2014 1608   CALCIUM 9.6 08/27/2016 1432   GFRNONAA 71 08/27/2016 1432   GFRNONAA 78 05/24/2014 1608   WBC 6.8 04/03/2015 1531   WBC 6.5 04/26/2014 1654   HGB 14.4 04/03/2015 1531   HCT 41.5 04/03/2015 1531   PLT 225 04/03/2015 1531   rivaroxaban (Xarelto) Dose: 20 mg daily  No concerns at this time, pharmacy records indicate consistent refills  [place] Pharmacist  09/07/2016, 6:52 PM

## 2016-11-12 ENCOUNTER — Encounter: Payer: PPO | Admitting: Internal Medicine

## 2016-11-27 ENCOUNTER — Encounter: Payer: Self-pay | Admitting: Internal Medicine

## 2016-11-27 ENCOUNTER — Ambulatory Visit (INDEPENDENT_AMBULATORY_CARE_PROVIDER_SITE_OTHER): Payer: PPO | Admitting: Internal Medicine

## 2016-11-27 ENCOUNTER — Encounter (INDEPENDENT_AMBULATORY_CARE_PROVIDER_SITE_OTHER): Payer: Self-pay

## 2016-11-27 VITALS — BP 123/69 | HR 74 | Temp 98.2°F | Ht 63.0 in | Wt 180.6 lb

## 2016-11-27 DIAGNOSIS — R05 Cough: Secondary | ICD-10-CM

## 2016-11-27 DIAGNOSIS — I4891 Unspecified atrial fibrillation: Secondary | ICD-10-CM

## 2016-11-27 DIAGNOSIS — R0981 Nasal congestion: Secondary | ICD-10-CM | POA: Diagnosis not present

## 2016-11-27 DIAGNOSIS — J069 Acute upper respiratory infection, unspecified: Secondary | ICD-10-CM

## 2016-11-27 DIAGNOSIS — Z7901 Long term (current) use of anticoagulants: Secondary | ICD-10-CM | POA: Diagnosis not present

## 2016-11-27 DIAGNOSIS — Z8701 Personal history of pneumonia (recurrent): Secondary | ICD-10-CM | POA: Diagnosis not present

## 2016-11-27 MED ORDER — APIXABAN 5 MG PO TABS: 5.0000 mg | ORAL_TABLET | Freq: Two times a day (BID) | ORAL | 3 refills | Status: DC

## 2016-11-27 NOTE — Patient Instructions (Addendum)
[name],   I am sorry you are not feeling well today. I think you have a viral upper respiratory infection. The treatment below will help alleviate your symptoms:  1. Nasal Irrigation BUFFERED ISOTONIC SALINE NASAL IRRIGATION The Benefits:  1. When you irrigate, the isotonic saline (salt water) acts as a solvent and washes the mucus crusts and other debris from your nose.  2. This decongests and improves the airflow into your nose. The sinus passages begin to open.  3. Studies have also shown that a salt water and an alkaline (baking soda) irrigation solution improves nasal membrane cell function (mucociliary flow of mucus debris).  The Recipe:  1. Choose a 1 -quart glass jar that is thoroughly cleansed.  2. Fill with sterile or distilled water, or you can boil water from the tap.  3. Add 1 to 2 heaping teaspoons of "pickling/canning/sea" salt (NOT table salt as it contains a large number of additives). This salt is available at the grocery store in the food canning section.  4. Add 1 teaspoon of Arm [employer] Hammer Baking Soda (pure bicarbonate).  5. Mix ingredients together and store at room temperature. Discard after one week. If you find this solution too strong, you may decrease the amount of salt added to 1 to 1  teaspoons. With children it is often best to start with a milder solution and advance slowly. Irrigate with 240 ml (8 oz) twice daily.  The Instructions:  You should plan to irrigate your nose with buffered isotonic saline 2 times per day. Many people prefer to warm the solution slightly in the microwave - but be sure that the solution is NOT HOT. Stand over the sink (some do this in the shower) and squirt the solution into each side of your nose, keeping your mouth open. This allows you to spit the saltwater out of your mouth. It will not harm you if you swallow a little.  If you have been told to use a nasal steroid such as Flonase, Nasonex, or Nasacort, you should always  use isortonic saline solution first, then use your nasal steroid product. The nasal steroid is much more effective when sprayed onto clean nasal membranes and the steroid medicine will reach deeper into the nose.  Most people experience a little burning sensation the first few times they use a isotonic saline solution, but this usually goes away within a few days.    2. Intranasal steroid spray called Flonase (Fluticasone): 2 sprays in each nostril daily, Best results after the nasal irrigation  Step 1. Prepare the nose. Blow the nose before administering the drug.   Step 2. Prime and activate the delivery device as recommended by the manufacturer.   Step 3. Position the head by tilting the head forward.   Step 4. Insert the tip of the applicator gently, avoiding contact with the septum.   Step 5. Aim the applicator tip about 45 from the floor of the nose and direct it at the outer corner of the eye on the  same side to avoid traumatizing or spraying the septum.   Step 6. Close the other nostril gently with a finger.    Step 7. Sniff or inhale gently while delivering the drug.   3. Antihistamine such as Claritin, Allegra, or Xyzal   -Take 1 pill once daily

## 2016-11-27 NOTE — Progress Notes (Signed)
CC: cough, congestion  HPI:  [name] is a 67 y.o. with past medical history as documented below presenting for cough, congestion, rhinorrhea, and post nasal drip. She states her symptoms started 4 days ago. Her cough is productive with yellow sputum, and has been keeping her up at night. She states she has some chest and neck soreness from coughing so much. She also has associated sinus pressure and headache. She denies shortness of breath, wheezing, fever, or chills. The patient has had pneumonia in the past and was concerned she was getting pneumonia again. She denies sick contacts or recent travel. She has not tried any over the counter remedies for her cough or congestion.   Past Medical History:  Diagnosis Date  . Atrial fibrillation ([place])   . Bradycardia, drug induced    08/2011 (dig)  . Bunion   . Carotid stenosis   . Carpal tunnel syndrome    mild   . Cholecystitis    s/p cholecystectomy  . Chronic back pain   . Chronic gastritis   . Depression   . Diabetic peripheral neuropathy ([place])   . Digoxin toxicity    08/2011  . Diverticulitis    dx 04/08/14  . Diverticulosis   . Drug-induced hypotension    08/2011 (Dig)  . Dysphagia    no documented strictures but responded positively to dilation in past.   . Epileptic seizure, tonic ([place])    .No meds since age of 48.  . Family history of adverse reaction to anesthesia    "my mother may have"  . Fatty liver   . Fibromyalgia   . Fibromyalgia   . GERD (gastroesophageal reflux disease)   . H/O hiatal hernia    S/P hernia repair  . Hyperlipidemia   . Hypertension   . Hypothyroid   . Internal hemorrhoid   . Mild cognitive impairment   . OSA (obstructive sleep apnea) dx'd 2003   "can't sleep w/that mask; lost some weight; maybe that's helped" (04/26/2014)  . Osteoarthritis   . Pneumonia X 2  . Sarcoidosis   . Skin neoplasm   . TIA (transient ischemic attack)    "don't know when I had it" (04/26/2014)  .  Transaminase or LDH elevation   . Type II diabetes mellitus ([place])    Review of Systems:   Review of Systems  Constitutional: Positive for chills and malaise/fatigue. Negative for fever.  HENT: Positive for congestion, sinus pain and sore throat. Negative for ear pain.   Eyes: Negative.   Respiratory: Positive for cough and sputum production. Negative for shortness of breath and wheezing.   Cardiovascular: Positive for chest pain.  Gastrointestinal: Positive for abdominal pain, diarrhea and nausea.  Genitourinary: Negative for dysuria.  Musculoskeletal: Positive for back pain and neck pain.  Skin: Negative for rash.  Neurological: Positive for headaches.    Physical Exam:  Vitals:   11/27/16 1515  BP: 123/69  Pulse: 74  Temp: 98.2 F (36.8 C)  TempSrc: Oral  SpO2: 98%  Weight: 180 lb 9.6 oz (81.9 kg)  Height: 5\' 3"  (1.6 m)   Physical Exam  Constitutional: She is oriented to person, place, and time. She appears well-developed and well-nourished.  Non-toxic appearance. She does not appear ill.  HENT:  Nose: Mucosal edema present. Right sinus exhibits no maxillary sinus tenderness and no frontal sinus tenderness. Left sinus exhibits no maxillary sinus tenderness and no frontal sinus tenderness.  Mouth/Throat: No oropharyngeal exudate or posterior oropharyngeal erythema.  Cardiovascular: Normal rate, regular rhythm and normal heart sounds.   Pulmonary/Chest: Effort normal and breath sounds normal. No respiratory distress. She has no wheezes. She has no rhonchi. She has no rales. She exhibits tenderness (reproducible).  Abdominal: Soft. Bowel sounds are normal.  Musculoskeletal: Normal range of motion. She exhibits no edema.  Neurological: She is alert and oriented to person, place, and time. No cranial nerve deficit.  Skin: Skin is warm and dry.  Psychiatric: She has a normal mood and affect. Her behavior is normal.    Assessment & Plan:   See Encounters Tab for problem based  charting.  Patient seen with Dr. Evette Doffing

## 2016-11-27 NOTE — Assessment & Plan Note (Signed)
Patient was expressing concern about the cost of her Xarelto because she is now in the donut hole ($600). She states she has approximately 3 weeks left of her xarelto, but is not entirely sure.   Discussed with the patient that I will send a prescription for Eliquis to her pharmacy and she should inquire about the cost. I advised her to continue taking the Xarelto. If the Eliquis is cheaper she can start that, but to call the office if she is going to start the new medication. Stressed that she cannot take both medicines at the same time and if she has any confusion to call the clinic. She has an appointment with her new PCP in several weeks.

## 2016-11-27 NOTE — Assessment & Plan Note (Addendum)
The patient's symptoms and physical exam are consistent with a viral upper respiratory infection.  On physical exam, the patient's lungs are clear to auscultation bilaterally with good aeration throughout all lung fields. No wheezing, rales, or rhonchi appreciated. Nasal turbinates mildly edematous and erythematous, no purulent drainage appreciated. She is afebrile, sating 98% on RA. She has no pulmonary disease such as COPD or asthma and is not a smoker.   Plan: -Nasal irrigation, instructions provided -Flonase 2 sprays each nostril once daily, recommended after nasal irrigation  -Allegra or Xyzal OTC  -Continue treatment for approximately one week or until symptoms resolve -RTC if gets a fever or symptoms worsen

## 2016-11-30 NOTE — Progress Notes (Signed)
Internal Medicine Clinic Attending  I saw and evaluated the patient.  I personally confirmed the key portions of the history and exam documented by Dr. LaCroce and I reviewed pertinent patient test results.  The assessment, diagnosis, and plan were formulated together and I agree with the documentation in the resident's note.  

## 2016-12-04 DIAGNOSIS — J40 Bronchitis, not specified as acute or chronic: Secondary | ICD-10-CM | POA: Diagnosis not present

## 2016-12-04 DIAGNOSIS — R05 Cough: Secondary | ICD-10-CM | POA: Diagnosis not present

## 2016-12-16 ENCOUNTER — Ambulatory Visit (INDEPENDENT_AMBULATORY_CARE_PROVIDER_SITE_OTHER): Payer: PPO | Admitting: Internal Medicine

## 2016-12-16 VITALS — BP 133/75 | HR 70 | Temp 97.9°F | Ht 63.0 in | Wt 179.7 lb

## 2016-12-16 DIAGNOSIS — E039 Hypothyroidism, unspecified: Secondary | ICD-10-CM

## 2016-12-16 DIAGNOSIS — I482 Chronic atrial fibrillation, unspecified: Secondary | ICD-10-CM

## 2016-12-16 DIAGNOSIS — Z79890 Hormone replacement therapy (postmenopausal): Secondary | ICD-10-CM

## 2016-12-16 DIAGNOSIS — E1121 Type 2 diabetes mellitus with diabetic nephropathy: Secondary | ICD-10-CM

## 2016-12-16 DIAGNOSIS — I10 Essential (primary) hypertension: Secondary | ICD-10-CM

## 2016-12-16 DIAGNOSIS — I4891 Unspecified atrial fibrillation: Secondary | ICD-10-CM | POA: Diagnosis not present

## 2016-12-16 DIAGNOSIS — Z7901 Long term (current) use of anticoagulants: Secondary | ICD-10-CM

## 2016-12-16 DIAGNOSIS — J018 Other acute sinusitis: Secondary | ICD-10-CM

## 2016-12-16 MED ORDER — CHLORPHENIRAMINE MALEATE 4 MG PO TABS: 4.0000 mg | ORAL_TABLET | Freq: Three times a day (TID) | ORAL | 0 refills | Status: DC | PRN

## 2016-12-16 MED ORDER — DOXYCYCLINE HYCLATE 100 MG PO CAPS: 100.0000 mg | ORAL_CAPSULE | Freq: Two times a day (BID) | ORAL | 0 refills | Status: DC

## 2016-12-16 MED ORDER — ROSUVASTATIN CALCIUM 20 MG PO TABS: ORAL_TABLET | ORAL | 0 refills | Status: DC

## 2016-12-16 MED ORDER — FAMOTIDINE 20 MG PO TABS: ORAL_TABLET | ORAL | 0 refills | Status: DC

## 2016-12-16 MED ORDER — DILTIAZEM HCL ER COATED BEADS 180 MG PO CP24: 180.0000 mg | ORAL_CAPSULE | Freq: Every day | ORAL | 1 refills | Status: DC

## 2016-12-16 MED ORDER — LEVOTHYROXINE SODIUM 50 MCG PO TABS: 50.0000 ug | ORAL_TABLET | Freq: Every morning | ORAL | 0 refills | Status: DC

## 2016-12-16 MED ORDER — LOSARTAN POTASSIUM 25 MG PO TABS: 12.5000 mg | ORAL_TABLET | Freq: Every day | ORAL | 0 refills | Status: DC

## 2016-12-16 MED ORDER — ATENOLOL 25 MG PO TABS: ORAL_TABLET | ORAL | 0 refills | Status: DC

## 2016-12-16 NOTE — Progress Notes (Signed)
   CC: Cough, med refill   HPI:  Ms.Catherin E Blanch Media is a 66 y.o. female with past medical history outlined below here with compliant of cough and for medication refill. For the details of today's visit, please refer to the assessment and plan.  Past Medical History:  Diagnosis Date  . Atrial fibrillation ([place])   . Bradycardia, drug induced    08/2011 ([facility])  . Bunion   . Carotid stenosis   . Carpal tunnel syndrome    mild   . Cholecystitis    s/p cholecystectomy  . Chronic back pain   . Chronic gastritis   . Depression   . Diabetic peripheral neuropathy ([place])   . Digoxin toxicity    08/2011  . Diverticulitis    dx 04/08/14  . Diverticulosis   . Drug-induced hypotension    08/2011 ([facility])  . Dysphagia    no documented strictures but responded positively to dilation in past.   . Epileptic seizure, tonic ([place])    .No meds since age of 50.  . Family history of adverse reaction to anesthesia    "my mother may have"  . Fatty liver   . Fibromyalgia   . Fibromyalgia   . GERD (gastroesophageal reflux disease)   . H/O hiatal hernia    S/P hernia repair  . Hyperlipidemia   . Hypertension   . Hypothyroid   . Internal hemorrhoid   . Mild cognitive impairment   . OSA (obstructive sleep apnea) dx'd 2003   "can't sleep w/that mask; lost some weight; maybe that's helped" (04/26/2014)  . Osteoarthritis   . Pneumonia X 2  . Sarcoidosis   . Skin neoplasm   . TIA (transient ischemic attack)    "don't know when I had it" (04/26/2014)  . Transaminase or LDH elevation   . Type II diabetes mellitus ([place])     Review of Systems  Constitutional: Positive for chills and fever.  HENT: Positive for congestion.   Respiratory: Positive for cough.     Physical Exam:  Vitals:   12/16/16 1441  BP: 133/75  Pulse: 70  Temp: 97.9 F (36.6 C)  TempSrc: Oral  SpO2: 99%  Weight: 179 lb 11.2 oz (81.5 kg)  Height: 5\' 3"  (1.6 m)    Constitutional: NAD, appears comfortable HEENT: Hoarse voice,  normal posterior oropharynx  Cardiovascular: RRR, no murmurs, rubs, or gallops.  Pulmonary/Chest: CTAB, no wheezes, rales, or rhonchi. Psychiatric: Normal mood and affect  Assessment & Plan:   See Encounters Tab for problem based charting.  Patient discussed with Dr. Eppie Gibson

## 2016-12-16 NOTE — Patient Instructions (Addendum)
[name],  It was a pleasure to see you today. I am sorry you are not feeling well. For your sinus infection, I have given you a prescription for doxycycline, an antibiotic. Please take this twice a day for 5 days. I have also given you a prescription for chlorpheniramine, an allergy medicine. Please take this every 8 hours as needed for cough. Please follow up with [place] in 6 months or sooner if needed. If you have any questions or concerns, call our clinic at [phone] or after hours call [phone] and ask for the internal medicine resident on call. Thank you!  - Dr. Philipp Ovens

## 2016-12-17 NOTE — Assessment & Plan Note (Addendum)
Well-controlled, 133/75. Refilled losartan 12.5 mg daily and atenolol 25 mg one and half tab BID.

## 2016-12-17 NOTE — Assessment & Plan Note (Signed)
Last TSH 05/14/2016 was 0.599. Patient is currently taking Synthroid 50 g tablets daily. Denies symptoms of hypo or hyper thyroidism.  -- Repeat TSH March of 2019 -- Refilled Synthroid

## 2016-12-17 NOTE — Assessment & Plan Note (Addendum)
Patient is presenting today with ongoing cough, congestion, and hoarse voice now for 4 weeks. She was seen in the urgent care 2 weeks ago for the same complaint and given a course of azithromycin which she completed without relief. She is endorsing subjective chills and fevers and has now started to lose her voice. Lungs are clear on exam and she is breathing 99% on room air. Symptoms are likely more consistent with a bacterial sinusitis rather than atypical pneumonia given her azithromycin failure. She has a penicillin allergy. Will do a 5 day course of doxycycline for likely bacterial sinusitis. Chlorpheniramine for congestive symptoms. -- Doxycycline 100 mg BID x 5 days -- Chlorpheniramine 4 mg q8 hours PRN

## 2016-12-17 NOTE — Assessment & Plan Note (Signed)
Rate controlled on diltiazem 180 mg daily. Anticoagulated on Xarelto for CHADSVSc of 7. She is currently in the donut hole for her medicare coverage and her last xarelto prescription costs > $600. Requesting samples to last until Jan 1st.  -- Refilled diltiazem 180 mg daily  -- Provided xarelto samples, 2 week supply. Instructed patient to return in 2 weeks to pick up new samples when available per Dr. Maudie Mercury

## 2016-12-17 NOTE — Assessment & Plan Note (Signed)
Diet-controlled type 2 diabetes, last A1c in June 2018 was 6.4. A1c repeat was ordered today, unfortunately patient left the office before having this done. Will follow up at next visit. -- A1c at follow up

## 2016-12-22 NOTE — Progress Notes (Signed)
Case discussed with Dr. Philipp Ovens at the time of the visit. We reviewed the resident's history and exam and pertinent patient test results. I agree with the assessment, diagnosis, and plan of care documented in the resident's note.  If the cough persists will consider alternative diagnoses.  She has sarcoid listed in her PMH and I see no recent PFTs.  This may be an area to explore further if this symptomatic therapy is ineffective.

## 2016-12-23 ENCOUNTER — Encounter: Payer: PPO | Admitting: Internal Medicine

## 2016-12-30 ENCOUNTER — Encounter (INDEPENDENT_AMBULATORY_CARE_PROVIDER_SITE_OTHER): Payer: Self-pay

## 2016-12-30 ENCOUNTER — Ambulatory Visit (INDEPENDENT_AMBULATORY_CARE_PROVIDER_SITE_OTHER): Payer: PPO | Admitting: *Deleted

## 2016-12-30 DIAGNOSIS — Z23 Encounter for immunization: Secondary | ICD-10-CM

## 2017-02-18 ENCOUNTER — Encounter: Payer: PPO | Admitting: Internal Medicine

## 2017-03-10 ENCOUNTER — Encounter (HOSPITAL_COMMUNITY): Payer: Self-pay | Admitting: *Deleted

## 2017-03-10 ENCOUNTER — Emergency Department (HOSPITAL_COMMUNITY): Payer: PPO

## 2017-03-10 ENCOUNTER — Other Ambulatory Visit: Payer: Self-pay

## 2017-03-10 ENCOUNTER — Emergency Department (HOSPITAL_COMMUNITY)
Admission: EM | Admit: 2017-03-10 | Discharge: 2017-03-10 | Disposition: A | Discharge status: Home / Self Care | Payer: PPO | Attending: Emergency Medicine | Admitting: Emergency Medicine

## 2017-03-10 DIAGNOSIS — Z91040 Latex allergy status: Secondary | ICD-10-CM | POA: Insufficient documentation

## 2017-03-10 DIAGNOSIS — R05 Cough: Secondary | ICD-10-CM | POA: Diagnosis not present

## 2017-03-10 DIAGNOSIS — E039 Hypothyroidism, unspecified: Secondary | ICD-10-CM | POA: Insufficient documentation

## 2017-03-10 DIAGNOSIS — Z8673 Personal history of transient ischemic attack (TIA), and cerebral infarction without residual deficits: Secondary | ICD-10-CM | POA: Insufficient documentation

## 2017-03-10 DIAGNOSIS — I10 Essential (primary) hypertension: Secondary | ICD-10-CM | POA: Diagnosis not present

## 2017-03-10 DIAGNOSIS — E119 Type 2 diabetes mellitus without complications: Secondary | ICD-10-CM | POA: Insufficient documentation

## 2017-03-10 DIAGNOSIS — Z9049 Acquired absence of other specified parts of digestive tract: Secondary | ICD-10-CM | POA: Diagnosis not present

## 2017-03-10 DIAGNOSIS — K529 Noninfective gastroenteritis and colitis, unspecified: Secondary | ICD-10-CM | POA: Diagnosis not present

## 2017-03-10 DIAGNOSIS — R059 Cough, unspecified: Secondary | ICD-10-CM

## 2017-03-10 DIAGNOSIS — Z85828 Personal history of other malignant neoplasm of skin: Secondary | ICD-10-CM | POA: Diagnosis not present

## 2017-03-10 DIAGNOSIS — F329 Major depressive disorder, single episode, unspecified: Secondary | ICD-10-CM | POA: Diagnosis not present

## 2017-03-10 DIAGNOSIS — R109 Unspecified abdominal pain: Secondary | ICD-10-CM | POA: Diagnosis not present

## 2017-03-10 DIAGNOSIS — R197 Diarrhea, unspecified: Secondary | ICD-10-CM | POA: Diagnosis not present

## 2017-03-10 LAB — URINALYSIS, ROUTINE W REFLEX MICROSCOPIC
BACTERIA UA: NONE SEEN
BILIRUBIN URINE: NEGATIVE
GLUCOSE, UA: NEGATIVE mg/dL
HGB URINE DIPSTICK: NEGATIVE
KETONES UR: NEGATIVE mg/dL
Leukocytes, UA: NEGATIVE
Nitrite: NEGATIVE
Protein, ur: 100 mg/dL — AB
Specific Gravity, Urine: 1.026 (ref 1.005–1.030)
pH: 5 (ref 5.0–8.0)

## 2017-03-10 LAB — COMPREHENSIVE METABOLIC PANEL
ALT: 16 U/L (ref 14–54)
AST: 21 U/L (ref 15–41)
Albumin: 4 g/dL (ref 3.5–5.0)
Alkaline Phosphatase: 70 U/L (ref 38–126)
Anion gap: 12 (ref 5–15)
BUN: 11 mg/dL (ref 6–20)
CO2: 23 mmol/L (ref 22–32)
Calcium: 9.2 mg/dL (ref 8.9–10.3)
Chloride: 103 mmol/L (ref 101–111)
Creatinine, Ser: 0.83 mg/dL (ref 0.44–1.00)
GFR calc Af Amer: >60 mL/min (ref >60)
GFR calc non Af Amer: >60 mL/min (ref >60)
Glucose, Bld: 115 mg/dL — ABNORMAL HIGH (ref 65–99)
Potassium: 4 mmol/L (ref 3.5–5.1)
Sodium: 138 mmol/L (ref 135–145)
Total Bilirubin: 2 mg/dL — ABNORMAL HIGH (ref 0.3–1.2)
Total Protein: 7.2 g/dL (ref 6.5–8.1)

## 2017-03-10 LAB — CBC
HCT: 44.1 % (ref 36.0–46.0)
Hemoglobin: 14.8 g/dL (ref 12.0–15.0)
MCH: 32.4 pg (ref 26.0–34.0)
MCHC: 33.6 g/dL (ref 30.0–36.0)
MCV: 96.5 fL (ref 78.0–100.0)
Platelets: 220 10*3/uL (ref 150–400)
RBC: 4.57 MIL/uL (ref 3.87–5.11)
RDW: 12.4 % (ref 11.5–15.5)
WBC: 9.4 10*3/uL (ref 4.0–10.5)

## 2017-03-10 LAB — LIPASE, BLOOD: Lipase: 35 U/L (ref 11–51)

## 2017-03-10 MED ORDER — ONDANSETRON HCL 4 MG/2ML IJ SOLN
4.0000 mg | Freq: Once | INTRAMUSCULAR | Status: AC
  Administered 2017-03-10: 4 mg via INTRAVENOUS
  Filled 2017-03-10: qty 2

## 2017-03-10 MED ORDER — METRONIDAZOLE IN NACL 5-0.79 MG/ML-% IV SOLN
500.0000 mg | Freq: Once | INTRAVENOUS | Status: AC
  Administered 2017-03-10: 500 mg via INTRAVENOUS
  Filled 2017-03-10: qty 100

## 2017-03-10 MED ORDER — CIPROFLOXACIN IN D5W 400 MG/200ML IV SOLN
400.0000 mg | Freq: Once | INTRAVENOUS | Status: AC
  Administered 2017-03-10: 400 mg via INTRAVENOUS
  Filled 2017-03-10: qty 200

## 2017-03-10 MED ORDER — METRONIDAZOLE 500 MG PO TABS: 500.0000 mg | ORAL_TABLET | Freq: Two times a day (BID) | ORAL | 0 refills | Status: DC

## 2017-03-10 MED ORDER — IOPAMIDOL (ISOVUE-300) INJECTION 61%
100.0000 mL | Freq: Once | INTRAVENOUS | Status: AC | PRN
  Administered 2017-03-10: 100 mL via INTRAVENOUS

## 2017-03-10 MED ORDER — ONDANSETRON HCL 4 MG PO TABS: 4.0000 mg | ORAL_TABLET | Freq: Three times a day (TID) | ORAL | 0 refills | Status: DC | PRN

## 2017-03-10 MED ORDER — SODIUM CHLORIDE 0.9 % IV BOLUS (SEPSIS)
1000.0000 mL | Freq: Once | INTRAVENOUS | Status: AC
  Administered 2017-03-10: 1000 mL via INTRAVENOUS

## 2017-03-10 MED ORDER — CIPROFLOXACIN HCL 500 MG PO TABS: 500.0000 mg | ORAL_TABLET | Freq: Two times a day (BID) | ORAL | 0 refills | Status: DC

## 2017-03-10 NOTE — ED Provider Notes (Signed)
Emergency Department Provider Note   I have reviewed the triage vital signs and the nursing notes.   Chief Complaint Cough and Diarrhea   HPI [name] Media is a 67 y.o. female multiple medical problems as documented below the presents to the emergency department today secondary to few days of symptoms.  Patient was recently in [place] with her family she flew back on Sunday and then today she started having congestion, sinus pressure postnasal drip, cough and diarrhea.  The next day she started noticing she has some blood streaking in her cough and also blood streaking in her diarrhea.  Both of these continue for the last couple days and then she started having some crampy abdominal pain that was better after diarrhea.  She has some nausea as well but no vomiting.  She had decreased intake secondary to the symptoms.  The abdominal pain seemed to get better with diarrhea but does not totally go away.  No urinary symptoms.  States she has a T-max of 99.0.  Has been sick like this before but not in a long time. No other associated or modifying symptoms.    Past Medical History:  Diagnosis Date  . Atrial fibrillation ([place])   . Bradycardia, drug induced    08/2011 ([facility])  . Bunion   . Carotid stenosis   . Carpal tunnel syndrome    mild   . Cholecystitis    s/p cholecystectomy  . Chronic back pain   . Chronic gastritis   . Depression   . Diabetic peripheral neuropathy ([place])   . Digoxin toxicity    08/2011  . Diverticulitis    dx 04/08/14  . Diverticulosis   . Drug-induced hypotension    08/2011 ([facility])  . Dysphagia    no documented strictures but responded positively to dilation in past.   . Epileptic seizure, tonic ([place])    .No meds since age of 27.  . Family history of adverse reaction to anesthesia    "my mother may have"  . Fatty liver   . Fibromyalgia   . Fibromyalgia   . GERD (gastroesophageal reflux disease)   . H/O hiatal hernia    S/P hernia repair  .  Hyperlipidemia   . Hypertension   . Hypothyroid   . Internal hemorrhoid   . Mild cognitive impairment   . OSA (obstructive sleep apnea) dx'd 2003   "can't sleep w/that mask; lost some weight; maybe that's helped" (04/26/2014)  . Osteoarthritis   . Pneumonia X 2  . Sarcoidosis   . Skin neoplasm   . TIA (transient ischemic attack)    "don't know when I had it" (04/26/2014)  . Transaminase or LDH elevation   . Type II diabetes mellitus [facility])     Patient Active Problem List   Diagnosis Date Noted  . Sinusitis 12/16/2016  . Upper respiratory infection with cough and congestion 11/27/2016  . Diarrhea 04/05/2015  . Left knee pain 12/19/2014  . Vitamin D deficiency 04/28/2014  . BPPV (benign paroxysmal positional vertigo) 04/26/2014  . Clostridium difficile infection 04/16/2014  . Arthritis, midfoot 08/16/2013  . Muscle spasm of back 08/10/2013  . Right foot pain 08/03/2013  . Poor dentition 04/03/2013  . Healthcare maintenance 03/31/2013  . Abnormality of gait 07/25/2012  . Headache 02/25/2012  . Chronic back pain 10/07/2011  . Financial difficulties 10/07/2011  . Hemorrhoids 10/06/2011  . Anemia 10/02/2011  . Diverticulosis 10/02/2011  . Precordial pain 09/01/2011  . Nonspecific (abnormal) findings on radiological  and other examination of gastrointestinal tract 08/07/2011  . Ventral hernia s/p laparoscopic lysis of adhesions and hernia repair with mesh 07/29/11 08/01/2011  . Joint pain 05/08/2011  . Preventative health care 04/28/2010  . Depression 05/09/2009  . OBESITY, UNSPECIFIED 11/06/2008  . Carotid stenosis 11/06/2008  . FIBROMYALGIA, SEVERE 09/14/2008  . GERD 09/12/2008  . Osteopenia 06/22/2008  . Hyperlipidemia 06/30/2007  . TIA 06/01/2007  . DIABETIC PERIPHERAL NEUROPATHY 04/05/2006  . Diabetes type 2, controlled ([place]) 03/18/2006  . Hypothyroidism 12/21/2005  . Essential hypertension 12/21/2005  . Atrial fibrillation ([place]) 12/21/2005  . Osteoarthritis  12/21/2005    Past Surgical History:  Procedure Laterality Date  . ABDOMINAL HYSTERECTOMY  1980's   partial  . BILATERAL OOPHORECTOMY  1980's   "after partial hysterectomy"  . BREAST BIOPSY Right    benign  . BREAST EXCISIONAL BIOPSY Right 03/2006   benign  . BREAST SURGERY Right ~ 2010   excision milk duct;  . CATARACT EXTRACTION W/PHACO  10/27/2010   Procedure: CATARACT EXTRACTION PHACO AND INTRAOCULAR LENS PLACEMENT (IOC);  Surgeon: Williams Che;  Location: [facility];  Service: Ophthalmology;  Laterality: Left;  CDE- 1.78  . CATARACT EXTRACTION W/PHACO  07/13/2011   Procedure: CATARACT EXTRACTION PHACO AND INTRAOCULAR LENS PLACEMENT (IOC);  Surgeon: Williams Che, MD;  Location: [facility];  Service: Ophthalmology;  Laterality: Right;  CDE:  1.65  . DILATION AND CURETTAGE OF UTERUS  1970; 1976  . ESOPHAGEAL DILATION    . ESOPHAGEAL DILATION  multiple  . FOOT SURGERY Left ~ 2011   "straightened toe & scraped bone below big toe"  . FOOT SURGERY Right ~ 2011   "scraped bone of big toe; shortened middle toet"  . HIATAL HERNIA REPAIR  1990   "had to have scar tissue removed 6 months after repair"  . Inverted nipples  1992  . LAPAROSCOPIC CHOLECYSTECTOMY  1980's  . LIPOSUCTION  1992  . SALPINGOOPHORECTOMY Bilateral    "6 months or so after hysterectomy"  . SKIN CANCER EXCISION  1990's   "front side of right shin"  . TUBAL LIGATION  1976  . VENTRAL HERNIA REPAIR  07/29/2011   Procedure: LAPAROSCOPIC VENTRAL HERNIA;  Surgeon: Adin Hector, MD;  Location: [facility];  Service: General;  Laterality: N/A;  multiple incarcerated hernias with mesh    Current Outpatient Rx  . Order #: [id number] Class: Normal  . Order #: [id number] Class: Normal  . Order #: [id number] Class: Normal  . Order #: [id number] Class: Normal  . Order #: [id number] Class: Normal  . Order #: [id number] Class: Normal  . Order #: [id number] Class: Normal  . Order #: [id number] Class: Normal  . Order #: [id number] Class: Normal    . Order #: [id number] Class: Print  . Order #: [id number] Class: Print  . Order #: [id number] Class: Print    Allergies Gemfibrozil; Latex; Penicillins; Adhesive [tape]; Codeine; Digoxin and related; Metformin and related; Omeprazole; and Ace inhibitors  Family History  Problem Relation Age of Onset  . Crohn's disease Mother   . Colitis Mother        Crohns  . Anesthesia problems Mother   . Arthritis Mother        rheumatoid; maternal side of family also with RA  . Cancer Father        skin  . Diabetes Father   . Heart disease Father   . Melanoma Father   . Coronary artery disease Father   . Diabetes Sister   . Depression Maternal Uncle   . Dementia Maternal  Uncle   . Colon cancer Paternal Uncle        ? if stomach or colon   . Hypotension Neg Hx   . Malignant hyperthermia Neg Hx   . Pseudochol deficiency Neg Hx     Social History Social History   Tobacco Use  . Smoking status: Never Smoker  . Smokeless tobacco: Never Used  Substance Use Topics  . Alcohol use: No    Alcohol/week: 0.0 oz  . Drug use: No    Review of Systems  All other systems negative except as documented in the HPI. All pertinent positives and negatives as reviewed in the HPI. ____________________________________________   PHYSICAL EXAM:  VITAL SIGNS: ED Triage Vitals  Enc Vitals Group     BP 03/10/17 1257 108/70     Pulse Rate 03/10/17 1257 79     Resp 03/10/17 1257 16     Temp 03/10/17 1257 98.5 F (36.9 C)     Temp Source 03/10/17 1257 Oral     SpO2 03/10/17 1257 98 %     Weight 03/10/17 1256 175 lb (79.4 kg)     Height 03/10/17 1256 5\' 3"  (1.6 m)     Head Circumference --      Peak Flow --      Pain Score 03/10/17 1254 8     Pain Loc --      Pain Edu? --      Excl. in [place]? --     Constitutional: Alert and oriented. Well appearing and in no acute distress. Eyes: Conjunctivae are normal. PERRL. EOMI. Head: Atraumatic. Nose: No congestion/rhinnorhea. Mouth/Throat: Mucous  membranes are moist.  Oropharynx non-erythematous. Neck: No stridor.  No meningeal signs.   Cardiovascular: Normal rate, regular rhythm. Good peripheral circulation. Grossly normal heart sounds.   Respiratory: Normal respiratory effort.  No retractions. Lungs CTAB. Gastrointestinal: Soft and nontender. No distention. Hyperactive bowel sounds. Musculoskeletal: No lower extremity tenderness nor edema. No gross deformities of extremities. Neurologic:  Normal speech and language. No gross focal neurologic deficits are appreciated.  Skin:  Skin is warm, dry and intact. No rash noted.  ____________________________________________   LABS (all labs ordered are listed, but only abnormal results are displayed)  Labs Reviewed  COMPREHENSIVE METABOLIC PANEL - Abnormal; Notable for the following components:      Result Value   Glucose, Bld 115 (*)    Total Bilirubin 2.0 (*)    All other components within normal limits  URINALYSIS, ROUTINE W REFLEX MICROSCOPIC - Abnormal; Notable for the following components:   Color, Urine AMBER (*)    APPearance HAZY (*)    Protein, ur 100 (*)    Squamous Epithelial / LPF 6-30 (*)    All other components within normal limits  GASTROINTESTINAL PANEL BY PCR, STOOL (REPLACES STOOL CULTURE)  LIPASE, BLOOD  CBC   ____________________________________________  RADIOLOGY  Dg Chest 2 View  Result Date: 03/10/2017 CLINICAL DATA:  Fever and productive cough EXAM: CHEST  2 VIEW COMPARISON:  11/29/2013 FINDINGS: Cardiac shadow is stable. Aortic calcifications are noted. Mild basilar atelectasis is noted on the left. No sizable effusion is seen. Degenerative change of thoracic spine is noted. IMPRESSION: Mild left basilar atelectasis. Electronically Signed   By: Inez Catalina M.D.   On: 03/10/2017 16:25   Ct Abdomen Pelvis W Contrast  Result Date: 03/10/2017 CLINICAL DATA:  Fever and diarrhea.  Abdominal pain EXAM: CT ABDOMEN AND PELVIS WITH CONTRAST TECHNIQUE:  Multidetector CT imaging of the abdomen and  pelvis was performed using the standard protocol following bolus administration of intravenous contrast. CONTRAST:  191mL ISOVUE-300 IOPAMIDOL (ISOVUE-300) INJECTION 61% COMPARISON:  April 08, 2014 FINDINGS: Lower chest: There is mild bibasilar lung scarring. No lung base edema or consolidation. Hepatobiliary: No focal liver lesions are appreciable on this study. Gallbladder is absent. There is no biliary duct dilatation. Pancreas: No pancreatic mass or inflammatory focus. Spleen: No splenic lesions are evident. Adrenals/Urinary Tract: Adrenals appear normal bilaterally. There is a 1 x 1 cm cyst arising from the posterior lower pole right kidney. There is no hydronephrosis on either side. There is a 1 mm calculus in mid right kidney. There is no appreciable ureteral calculus on either side. Urinary bladder is midline with wall thickness within normal limits. Stomach/Bowel: There are scattered diverticula in the sigmoid colon. There is no appreciable diverticulitis currently. There are areas of probable muscular hypertrophy from chronic diverticulosis in the sigmoid colon. There is no other bowel wall thickening evident. No evident bowel obstruction. No free air or portal venous air. Vascular/Lymphatic: There are foci of atherosclerotic calcification in the aorta. No abdominal aortic aneurysm. Major mesenteric vessels appear patent. There is no adenopathy in the abdomen or pelvis. Reproductive: Uterus is absent.  There is no pelvic mass. Other: Appendix appears unremarkable. There is no evident ascites or abscess in the abdomen or pelvis. There is fat in each inguinal ring. There is a minimal ventral hernia containing only fat. Musculoskeletal: There is degenerative change in the lumbar spine. There is a prominent Schmorl's node along the superior aspect of the L3. There are no blastic or lytic bone lesions. There is no intramuscular or abdominal wall lesion. IMPRESSION:  1. Sigmoid diverticulosis with areas of mild wall thickening felt to most likely represent muscular hypertrophy from chronic diverticulosis. No frank diverticulitis evident. Given these areas of wall thickening and diarrhea, it may be prudent to consider direct visualization these areas after appropriate colonic cleansing. 2. No evident bowel obstruction. No abscess. Appendix region appears normal. 3. 1 mm nonobstructing calculus in the right kidney. No ureteral calculus on either side. No hydronephrosis. 4.  Gallbladder absent.  Uterus absent. 5. Moderate fat in each inguinal ring. Rather minimal ventral hernia containing only fat. 6.  Aortic atherosclerosis. Aortic Atherosclerosis (ICD10-I70.0). Electronically Signed   By: Lowella Grip III M.D.   On: 03/10/2017 18:10    ____________________________________________   PROCEDURES  Procedure(s) performed:   Procedures   ____________________________________________   INITIAL IMPRESSION / ASSESSMENT AND PLAN / ED COURSE  Suspect likely gastroenteritis and blood is just from coughing/diarrhea and related to irritation. hwoever with length of symptoms, mild ttp in abdomen, will ct/cxr to eval for further lesions. Zofran/fluids for now.   Ct w/ concern for likely colitis. Plan for outpatient antibiotics and GI follow up for colonoscopy to ensure improvement and not malignancy. Tolerating PO currently.   Pertinent labs & imaging results that were available during my care of the patient were reviewed by me and considered in my medical decision making (see chart for details).  ____________________________________________  FINAL CLINICAL IMPRESSION(S) / ED DIAGNOSES  Final diagnoses:  Cough  Colitis     MEDICATIONS GIVEN DURING THIS VISIT:  Medications  sodium chloride 0.9 % bolus 1,000 mL (0 mLs Intravenous Stopped 03/10/17 1826)  ondansetron (ZOFRAN) injection 4 mg (4 mg Intravenous Given 03/10/17 1712)  iopamidol (ISOVUE-300) 61 %  injection 100 mL (100 mLs Intravenous Contrast Given 03/10/17 1733)  ciprofloxacin (CIPRO) IVPB 400 mg (0 mg  Intravenous Stopped 03/10/17 2154)  metroNIDAZOLE (FLAGYL) IVPB 500 mg (0 mg Intravenous Stopped 03/10/17 2011)     NEW OUTPATIENT MEDICATIONS STARTED DURING THIS VISIT:  This SmartLink is deprecated. Use AVSMEDLIST instead to display the medication list for a patient.  Note:  This note was prepared with assistance of Dragon voice recognition software. Occasional wrong-word or sound-a-like substitutions may have occurred due to the inherent limitations of voice recognition software.   Jaydence Arnesen, Corene Cornea, MD 03/11/17 0003

## 2017-03-10 NOTE — ED Triage Notes (Addendum)
Pt c/o fever, headache, diarrhea, productive cough, sneezing, generalized body aches since 03/07/17. Pt also c/o abdominal soreness. Reports nausea, denies vomiting.

## 2017-03-10 NOTE — ED Notes (Signed)
Pt goes sick after plane ride home on the 10th, cough with bloody sputum, congestion, nausea and diarrhea

## 2017-03-11 ENCOUNTER — Telehealth: Payer: Self-pay | Admitting: Internal Medicine

## 2017-03-12 ENCOUNTER — Ambulatory Visit: Payer: PPO | Admitting: Cardiology

## 2017-03-12 LAB — GASTROINTESTINAL PANEL BY PCR, STOOL (REPLACES STOOL CULTURE)

## 2017-03-12 NOTE — Telephone Encounter (Signed)
Pt scheduled to see Alonza Bogus PA 03/16/17@3pm , pt aware of appt.

## 2017-03-12 NOTE — Telephone Encounter (Signed)
She has not been seen here in almost 3 years. If she is having diarrhea she needs a GI pathogen panel. She needs an office visit with an advanced practitioner. Thanks

## 2017-03-12 NOTE — Telephone Encounter (Signed)
Pt was seen at [facility] and had a CT done that showed possible colitis/enlarged colon. Pt states she was told to call and schedule a colonoscopy asap. Dr. Henrene Pastor please advise.

## 2017-03-16 ENCOUNTER — Other Ambulatory Visit: Payer: PPO

## 2017-03-16 ENCOUNTER — Ambulatory Visit (INDEPENDENT_AMBULATORY_CARE_PROVIDER_SITE_OTHER): Payer: PPO | Admitting: Gastroenterology

## 2017-03-16 ENCOUNTER — Encounter: Payer: Self-pay | Admitting: Gastroenterology

## 2017-03-16 VITALS — BP 132/68 | HR 80 | Ht 63.0 in | Wt 180.8 lb

## 2017-03-16 DIAGNOSIS — R197 Diarrhea, unspecified: Secondary | ICD-10-CM | POA: Diagnosis not present

## 2017-03-16 DIAGNOSIS — K625 Hemorrhage of anus and rectum: Secondary | ICD-10-CM

## 2017-03-16 DIAGNOSIS — R9389 Abnormal findings on diagnostic imaging of other specified body structures: Secondary | ICD-10-CM

## 2017-03-16 HISTORY — DX: Abnormal findings on diagnostic imaging of other specified body structures: R93.89

## 2017-03-16 MED ORDER — PEG-KCL-NACL-NASULF-NA ASC-C 140 G PO SOLR: 1.0000 | ORAL | 0 refills | Status: DC

## 2017-03-16 NOTE — Patient Instructions (Addendum)
Your physician has requested that you go to the basement for lab work before leaving today.  You have been scheduled for a colonoscopy. Please follow written instructions given to you at your visit today.  Please pick up your prep supplies at the pharmacy within the next 1-3 days. If you use inhalers (even only as needed), please bring them with you on the day of your procedure. Your physician has requested that you go to www.[email or web address].com and enter the access code given to you at your visit today. This web site gives a general overview about your procedure. However, you should still follow specific instructions given to you by our office regarding your preparation for the procedure.   

## 2017-03-16 NOTE — Progress Notes (Signed)
Assessment and plans reviewed  

## 2017-03-16 NOTE — Progress Notes (Addendum)
03/16/2017 [name] [id number] [birth year]   HISTORY OF PRESENT ILLNESS: This is a pleasant 67 year old female who is known to Dr. Henrene Pastor.  She has a history of C. difficile, IBS diarrhea, diverticulitis, dysphasia.  She is here today with complaints of diarrhea, rectal bleeding, and abdominal pain.  She says that she has intermittent diarrhea with irritable bowel syndrome, but on December 28 she had sudden onset of severe diarrhea.  She is having several bowel movements a day, also getting up several times during the night with diarrhea.  She also had a large amount of rectal bleeding as well.  Abdominal pain is on the left side mostly, but all feels sore.  She did go to the emergency department at [facility] where CBC, CMP, lipase were unremarkable.  GI pathogen panel was negative.  CT scan of the abdomen and pelvis with contrast was performed and showed the following:  IMPRESSION: 1. Sigmoid diverticulosis with areas of mild wall thickening felt to most likely represent muscular hypertrophy from chronic diverticulosis. No frank diverticulitis evident. Given these areas of wall thickening and diarrhea, it may be prudent to consider direct visualization these areas after appropriate colonic cleansing.  2. No evident bowel obstruction. No abscess. Appendix region appears normal.  3. 1 mm nonobstructing calculus in the right kidney. No ureteral calculus on either side. No hydronephrosis.  4.  Gallbladder absent.  Uterus absent.  5. Moderate fat in each inguinal ring. Rather minimal ventral hernia containing only fat.  6.  Aortic atherosclerosis.  They gave her prescriptions for Cipro 500 mg twice daily and Flagyl 500 mg twice daily for 7 days.  She will complete those antibiotics tomorrow.  She admits that she has had some improvement in her symptoms, but all symptoms are still present.  The diarrhea has lessened, but she is still having nocturnal bowel movements as well.   Bleeding has lessened, but is still present with bowel movements.  Still complaining of soreness in her abdomen.  No fevers or chills.  No nausea or vomiting.  Her last colonoscopy was in August 2010 at which time she was found to have diverticulosis, but was otherwise unremarkable.    She is on Xarelto for atrial fibrillation follows with Dr. Percival Spanish for cardiology.  Has held it for other procedures in the past without issues.  Past Medical History:  Diagnosis Date  . Atrial fibrillation ([place])   . Bradycardia, drug induced    08/2011 (dig)  . Bunion   . Carotid stenosis   . Carpal tunnel syndrome    mild   . Cholecystitis    s/p cholecystectomy  . Chronic back pain   . Chronic gastritis   . Depression   . Diabetic peripheral neuropathy ([place])   . Digoxin toxicity    08/2011  . Diverticulitis    dx 04/08/14  . Diverticulosis   . Drug-induced hypotension    08/2011 (Dig)  . Dysphagia    no documented strictures but responded positively to dilation in past.   . Epileptic seizure, tonic ([employer])    .No meds since age of 5.  . Family history of adverse reaction to anesthesia    "my mother may have"  . Fatty liver   . Fibromyalgia   . Fibromyalgia   . GERD (gastroesophageal reflux disease)   . H/O hiatal hernia    S/P hernia repair  . Hyperlipidemia   . Hypertension   . Hypothyroid   . Internal hemorrhoid   .  Mild cognitive impairment   . OSA (obstructive sleep apnea) dx'd 2003   "can't sleep w/that mask; lost some weight; maybe that's helped" (04/26/2014)  . Osteoarthritis   . Pneumonia X 2  . Sarcoidosis   . Skin neoplasm   . TIA (transient ischemic attack)    "don't know when I had it" (04/26/2014)  . Transaminase or LDH elevation   . Type II diabetes mellitus ([place])    Past Surgical History:  Procedure Laterality Date  . ABDOMINAL HYSTERECTOMY  1980's   partial  . BILATERAL OOPHORECTOMY  1980's   "after partial hysterectomy"  . BREAST BIOPSY Right    benign  .  BREAST EXCISIONAL BIOPSY Right 03/2006   benign  . BREAST SURGERY Right ~ 2010   excision milk duct;  . CATARACT EXTRACTION W/PHACO  10/27/2010   Procedure: CATARACT EXTRACTION PHACO AND INTRAOCULAR LENS PLACEMENT (IOC);  Surgeon: Williams Che;  Location: [facility];  Service: Ophthalmology;  Laterality: Left;  CDE- 1.78  . CATARACT EXTRACTION W/PHACO  07/13/2011   Procedure: CATARACT EXTRACTION PHACO AND INTRAOCULAR LENS PLACEMENT (IOC);  Surgeon: Williams Che, MD;  Location: [facility];  Service: Ophthalmology;  Laterality: Right;  CDE:  1.65  . DILATION AND CURETTAGE OF UTERUS  1970; 1976  . ESOPHAGEAL DILATION  multiple  . FOOT SURGERY Left ~ 2011   "straightened toe & scraped bone below big toe"  . FOOT SURGERY Right ~ 2011   "scraped bone of big toe; shortened middle toet"  . HIATAL HERNIA REPAIR  1990   "had to have scar tissue removed 6 months after repair"  . Inverted nipples  1992  . LAPAROSCOPIC CHOLECYSTECTOMY  1980's  . LIPOSUCTION  1992  . SALPINGOOPHORECTOMY Bilateral    "6 months or so after hysterectomy"  . SKIN CANCER EXCISION  1990's   "front side of right shin"  . TUBAL LIGATION  1976  . VENTRAL HERNIA REPAIR  07/29/2011   Procedure: LAPAROSCOPIC VENTRAL HERNIA;  Surgeon: Adin Hector, MD;  Location: [facility];  Service: General;  Laterality: N/A;  multiple incarcerated hernias with mesh    reports that  has never smoked. she has never used smokeless tobacco. She reports that she does not drink alcohol or use drugs. family history includes Anesthesia problems in her mother; Arthritis in her mother; Colitis in her mother; Colon cancer in her paternal uncle; Coronary artery disease in her father; Crohn's disease in her mother; Dementia in her maternal uncle; Depression in her maternal uncle; Diabetes in her father and sister; Heart disease in her father; Melanoma in her father; Skin cancer in her father. Allergies  Allergen Reactions  . Gemfibrozil Swelling    REACTION:  Angioedema  . Latex Other (See Comments)    Blisters where touched or applied  . Penicillins Hives    Will spread in patches all over the body.  . Adhesive [Tape] Other (See Comments)    Will blister skin where applied - do not use BAND-AIDS.  Marland Kitchen Codeine Hives    Will spread in patches all over the body.  . Digoxin And Related     Makes BP drop  . Metformin And Related Diarrhea  . Omeprazole Diarrhea  . Ace Inhibitors Rash    Cough and rash       Outpatient Encounter Medications as of 03/16/2017  Medication Sig  . atenolol (TENORMIN) 25 MG tablet TAKE 1 AND 1/2 TABLET TWICE A DAY  . Cholecalciferol ([employer] VITAMIN D3) 1000  units capsule Take 1 capsule (1,000 Units total) by mouth daily. (Patient taking differently: Take 2,000 Units by mouth daily. )  . ciprofloxacin (CIPRO) 500 MG tablet Take 1 tablet (500 mg total) by mouth 2 (two) times daily.  Marland Kitchen diltiazem (CARTIA XT) 180 MG 24 hr capsule Take 1 capsule (180 mg total) by mouth daily.  . famotidine (PEPCID) 20 MG tablet TAKE ONE (1) TABLET EACH DAY  . FLUoxetine (PROZAC) 20 MG tablet Take 1 tablet (20 mg total) by mouth daily.  Marland Kitchen levothyroxine (SYNTHROID, LEVOTHROID) 50 MCG tablet Take 1 tablet (50 mcg total) by mouth every morning.  Marland Kitchen losartan (COZAAR) 25 MG tablet Take 0.5 tablets (12.5 mg total) by mouth daily.  . metroNIDAZOLE (FLAGYL) 500 MG tablet Take 1 tablet (500 mg total) by mouth 2 (two) times daily. One po bid x 7 days  . ondansetron (ZOFRAN) 4 MG tablet Take 1 tablet (4 mg total) by mouth every 8 (eight) hours as needed for nausea or vomiting.  . Probiotic Product (PROBIOTIC ADVANCED PO) Take 1 capsule by mouth daily.  . rivaroxaban (XARELTO) 20 MG TABS tablet TAKE 1 TABLET DAILY WITH SUPPER  . rosuvastatin (CRESTOR) 20 MG tablet TAKE ONE (1) TABLET EACH DAY   No facility-administered encounter medications on file as of 03/16/2017.      REVIEW OF SYSTEMS  : All other systems reviewed and negative except where noted in the  History of Present Illness.   PHYSICAL EXAM: BP 132/68   Pulse 80   Ht 5\' 3"  (1.6 m)   Wt 180 lb 12.8 oz (82 kg)   LMP 06/03/1978   BMI 32.03 kg/m  General: Well developed white female in no acute distress Head: Normocephalic and atraumatic Eyes:  Sclerae anicteric, conjunctiva pink. Ears: Normal auditory acuity Lungs: Clear throughout to auscultation; no increased WOB. Heart: Regular rate and rhythm; no M/R/G. Abdomen: Soft, non-distended.  BS present and hyperactive.  Mild left sided TTP. Rectal:  Will be done at the time of colonoscopy. Musculoskeletal: Symmetrical with no gross deformities  Skin: No lesions on visible extremities Extremities: No edema  Neurological: Alert oriented x 4, grossly non-focal Psychological:  Alert and cooperative. Normal mood and affect  ASSESSMENT AND PLAN: *67 year old female with sudden onset diarrhea, rectal bleeding, left-sided abdominal pain, and abnormal CT scan.  Stool GI pathogen panel was negative, but C. difficile was not checked.  She does have a history of C. difficile.  Will check stool for C. difficile and if positive will treat.  We will tentatively plan for colonoscopy as well as her last was 8.5 years ago. *Chronic anticoagulation with xarelto for atrial fibrillation:  Will hold Xarelto for 2 days prior to endoscopic procedures - will instruct when and how to resume after procedure. Benefits and risks of procedure explained including risks of bleeding, perforation, infection, missed lesions, reactions to medications and possible need for hospitalization and surgery for complications. Additional rare but real risk of stroke or other vascular clotting events off Xarelto also explained and need to seek urgent help if any signs of these problems occur. Will communicate by phone or EMR with patient's prescribing provider, Dr. Percival Spanish, to confirm that holding Xarelto is reasonable in this case.    CC:  Ina Homes, MD

## 2017-03-17 ENCOUNTER — Telehealth: Payer: Self-pay | Admitting: Emergency Medicine

## 2017-03-17 ENCOUNTER — Telehealth: Payer: Self-pay | Admitting: Gastroenterology

## 2017-03-17 ENCOUNTER — Other Ambulatory Visit: Payer: Self-pay | Admitting: Emergency Medicine

## 2017-03-17 ENCOUNTER — Other Ambulatory Visit: Payer: PPO

## 2017-03-17 DIAGNOSIS — R197 Diarrhea, unspecified: Secondary | ICD-10-CM

## 2017-03-17 DIAGNOSIS — K625 Hemorrhage of anus and rectum: Secondary | ICD-10-CM | POA: Diagnosis not present

## 2017-03-17 DIAGNOSIS — R9389 Abnormal findings on diagnostic imaging of other specified body structures: Secondary | ICD-10-CM | POA: Diagnosis not present

## 2017-03-17 NOTE — Telephone Encounter (Signed)
Patient calling back regarding this.  °

## 2017-03-17 NOTE — Telephone Encounter (Signed)
   [name] Jun 08, 1950 [id number]   We have scheduled the above named patient for a(n) colonoscopy procedure. Our records show that (s)he is on anticoagulation therapy.  Please advise as to whether the patient may come off their therapy of Xarelto 2 days prior to their procedure which is scheduled for 04-21-17.  Please route your response to Tinnie Gens, CMA or fax response to [phone].  Sincerely,    [facility]

## 2017-03-17 NOTE — Telephone Encounter (Signed)
Spoke to patient and reassured her that we should be able to get her a sample of [place] closer to her appt. She states there should be an alternative that is cheaper because she is on a fixed income. I assured patient that we will get her a prep if she could just call back closer to her appointment.

## 2017-03-18 LAB — C. DIFFICILE GDH AND TOXIN A/B
GDH ANTIGEN: NOT DETECTED
MICRO NUMBER:: 90034831
SPECIMEN QUALITY:: ADEQUATE
TOXIN A AND B: NOT DETECTED

## 2017-03-18 NOTE — Telephone Encounter (Signed)
Not sure if cardiac clearance is needed or if this is only medication clearance, will need to check with GI service. Either way, she is overdue for followup. Earliest time she can see Dr. Percival Spanish is 04/19/2017, however colonoscopy scheduled for 04/21/2017, will also forward to pharmacist pool on advise whether the patient need to hold Xarelto prior to 04/19/2017  Signed, Almyra Deforest PA Pager: [phone]

## 2017-03-18 NOTE — Telephone Encounter (Signed)
   Primary Cardiologist:James Hochrein, MD  Chart reviewed as part of pre-operative protocol coverage. Because of [name]'s past medical history and time since last visit, he/she will require a follow-up visit in order to better assess preoperative cardiovascular risk.  Pre-op covering staff: - Please schedule appointment and call patient to inform them. - Please contact requesting surgeon's office via preferred method (i.e, phone, fax) to inform them of need for appointment prior to surgery.  Appointment need to be before 04/19/2017 to allow adequate time to hold Xarelto, she was last seen 1.5 years ago.   Almyra Deforest, [place]  03/18/2017, 2:56 PM

## 2017-03-18 NOTE — Telephone Encounter (Signed)
Patient with diagnosis of atrial fibrillation on Xarelto for anticoagulation.    Procedure: colonoscopy Date of procedure: Feb 13  CHADS2-VASc score of  6 (HTN, AGE, DM2, stroke/tia x 2, female)  Note that patient chart indicated prior TIA but patient "doesn't know when"  CrCl 84 Platelet count 220  Per office protocol, patient can hold Xarelto for 1 days prior to procedure.   Patient will not need bridging with Lovenox (enoxaparin) around procedure.  patient should restart Xarelto on the evening of procedure or day after, at discretion of procedure MD

## 2017-03-19 NOTE — Telephone Encounter (Signed)
Per Pre-Op protocol: Pt was advised she needed an appt be fore she could be cleared for her procedure 04/21/17. Pt is scheduled to see Kerin Ransom, PA 04/12/17 at the [facility] office. Pt is agreeable and thanked me for my call.

## 2017-03-19 NOTE — Telephone Encounter (Signed)
See notes by Almyra Deforest, PA-C and K. Alvastad, PharmD below.  Patient needs follow up appointment before her procedure.  Please schedule follow up with Dr. Minus Breeding or a PA/NP on his care team in the next 2-3 weeks.  (As anticoagulation addressed and patient needs appt before procedure, this note will be removed from pre-op pool).  Richardson Dopp, PA-C    03/19/2017 8:40 AM

## 2017-04-08 ENCOUNTER — Encounter: Payer: Self-pay | Admitting: Internal Medicine

## 2017-04-12 ENCOUNTER — Ambulatory Visit: Payer: PPO | Admitting: Cardiology

## 2017-04-13 ENCOUNTER — Telehealth: Payer: Self-pay | Admitting: Internal Medicine

## 2017-04-15 ENCOUNTER — Encounter: Payer: Self-pay | Admitting: Cardiovascular Disease

## 2017-04-15 ENCOUNTER — Telehealth: Payer: Self-pay | Admitting: Physician Assistant

## 2017-04-15 ENCOUNTER — Encounter (INDEPENDENT_AMBULATORY_CARE_PROVIDER_SITE_OTHER): Payer: Self-pay

## 2017-04-15 ENCOUNTER — Encounter: Payer: Self-pay | Admitting: Cardiology

## 2017-04-15 ENCOUNTER — Ambulatory Visit (INDEPENDENT_AMBULATORY_CARE_PROVIDER_SITE_OTHER): Payer: PPO | Admitting: Cardiology

## 2017-04-15 VITALS — BP 118/72 | HR 77 | Ht 63.0 in | Wt 177.4 lb

## 2017-04-15 DIAGNOSIS — K625 Hemorrhage of anus and rectum: Secondary | ICD-10-CM | POA: Diagnosis not present

## 2017-04-15 DIAGNOSIS — E1121 Type 2 diabetes mellitus with diabetic nephropathy: Secondary | ICD-10-CM | POA: Diagnosis not present

## 2017-04-15 DIAGNOSIS — R079 Chest pain, unspecified: Secondary | ICD-10-CM

## 2017-04-15 DIAGNOSIS — I482 Chronic atrial fibrillation, unspecified: Secondary | ICD-10-CM

## 2017-04-15 DIAGNOSIS — Z01818 Encounter for other preprocedural examination: Secondary | ICD-10-CM | POA: Diagnosis not present

## 2017-04-15 DIAGNOSIS — I10 Essential (primary) hypertension: Secondary | ICD-10-CM | POA: Diagnosis not present

## 2017-04-15 DIAGNOSIS — I4891 Unspecified atrial fibrillation: Secondary | ICD-10-CM | POA: Diagnosis not present

## 2017-04-15 DIAGNOSIS — I6529 Occlusion and stenosis of unspecified carotid artery: Secondary | ICD-10-CM | POA: Diagnosis not present

## 2017-04-15 MED ORDER — LOSARTAN POTASSIUM 25 MG PO TABS: 12.5000 mg | ORAL_TABLET | Freq: Every day | ORAL | 1 refills | Status: DC

## 2017-04-15 MED ORDER — ATENOLOL 25 MG PO TABS: ORAL_TABLET | ORAL | 1 refills | Status: DC

## 2017-04-15 MED ORDER — DILTIAZEM HCL ER COATED BEADS 180 MG PO CP24: 180.0000 mg | ORAL_CAPSULE | Freq: Every day | ORAL | 1 refills | Status: DC

## 2017-04-15 MED ORDER — RIVAROXABAN 20 MG PO TABS: ORAL_TABLET | ORAL | 1 refills | Status: DC
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
## 2017-04-15 NOTE — Telephone Encounter (Signed)
Plenvu sample left up front for patient.

## 2017-04-15 NOTE — Telephone Encounter (Signed)
Medical clearance and anticoagulation advice for colonoscopy faxed to number listed in previous phone call: [phone]

## 2017-04-15 NOTE — Patient Instructions (Addendum)
Your PA recommends that you return for lab work TODAY - [place] PA has requested that you have a lexiscan myoview. For further information please visit [email or web address]. Please follow instruction sheet, as given.  Your PA has requested that you have a carotid duplex. This test is an ultrasound of the carotid arteries in your neck. It looks at blood flow through these arteries that supply the brain with blood. Allow one hour for this exam. There are no restrictions or special instructions.  You can [place] prior to colonoscopy  When/if you need more xarelto samples, please call our office  Your physician recommends that you schedule a follow-up appointment in 3-4 months with Dr. Percival Spanish

## 2017-04-16 ENCOUNTER — Other Ambulatory Visit: Payer: Self-pay | Admitting: Internal Medicine

## 2017-04-16 DIAGNOSIS — E039 Hypothyroidism, unspecified: Secondary | ICD-10-CM

## 2017-04-16 LAB — CBC
Hematocrit: 43.2 % (ref 34.0–46.6)
Hemoglobin: 14.8 g/dL (ref 11.1–15.9)
MCH: 32.6 pg (ref 26.6–33.0)
MCHC: 34.3 g/dL (ref 31.5–35.7)
MCV: 95 fL (ref 79–97)
Platelets: 218 10*3/uL (ref 150–379)
RBC: 4.54 x10E6/uL (ref 3.77–5.28)
RDW: 12.8 % (ref 12.3–15.4)
WBC: 7.9 10*3/uL (ref 3.4–10.8)

## 2017-04-20 ENCOUNTER — Encounter (HOSPITAL_COMMUNITY): Payer: PPO

## 2017-04-20 ENCOUNTER — Ambulatory Visit (HOSPITAL_COMMUNITY): Payer: PPO

## 2017-04-21 ENCOUNTER — Ambulatory Visit (HOSPITAL_COMMUNITY): Payer: PPO

## 2017-04-21 ENCOUNTER — Encounter: Payer: Self-pay | Admitting: Internal Medicine

## 2017-04-21 ENCOUNTER — Encounter (HOSPITAL_COMMUNITY): Payer: PPO

## 2017-04-21 ENCOUNTER — Other Ambulatory Visit: Payer: Self-pay

## 2017-04-21 ENCOUNTER — Ambulatory Visit (AMBULATORY_SURGERY_CENTER): Payer: PPO | Admitting: Internal Medicine

## 2017-04-21 VITALS — BP 91/61 | HR 76 | Temp 97.7°F | Resp 12 | Ht 63.0 in | Wt 180.0 lb

## 2017-04-21 DIAGNOSIS — D122 Benign neoplasm of ascending colon: Secondary | ICD-10-CM | POA: Diagnosis not present

## 2017-04-21 DIAGNOSIS — R197 Diarrhea, unspecified: Secondary | ICD-10-CM | POA: Diagnosis not present

## 2017-04-21 DIAGNOSIS — I4891 Unspecified atrial fibrillation: Secondary | ICD-10-CM | POA: Diagnosis not present

## 2017-04-21 DIAGNOSIS — D124 Benign neoplasm of descending colon: Secondary | ICD-10-CM | POA: Diagnosis not present

## 2017-04-21 DIAGNOSIS — R9389 Abnormal findings on diagnostic imaging of other specified body structures: Secondary | ICD-10-CM

## 2017-04-21 DIAGNOSIS — K6289 Other specified diseases of anus and rectum: Secondary | ICD-10-CM | POA: Diagnosis not present

## 2017-04-21 DIAGNOSIS — R933 Abnormal findings on diagnostic imaging of other parts of digestive tract: Secondary | ICD-10-CM | POA: Diagnosis not present

## 2017-04-21 DIAGNOSIS — D123 Benign neoplasm of transverse colon: Secondary | ICD-10-CM | POA: Diagnosis not present

## 2017-04-21 DIAGNOSIS — I10 Essential (primary) hypertension: Secondary | ICD-10-CM | POA: Diagnosis not present

## 2017-04-21 DIAGNOSIS — E039 Hypothyroidism, unspecified: Secondary | ICD-10-CM | POA: Diagnosis not present

## 2017-04-21 DIAGNOSIS — K635 Polyp of colon: Secondary | ICD-10-CM | POA: Diagnosis not present

## 2017-04-21 DIAGNOSIS — K599 Functional intestinal disorder, unspecified: Secondary | ICD-10-CM | POA: Diagnosis not present

## 2017-04-21 DIAGNOSIS — E119 Type 2 diabetes mellitus without complications: Secondary | ICD-10-CM | POA: Diagnosis not present

## 2017-04-21 MED ORDER — SODIUM CHLORIDE 0.9 % IV SOLN: 500.0000 mL | Freq: Once | INTRAVENOUS | Status: DC

## 2017-04-21 NOTE — Progress Notes (Signed)
To rewcovery, report to RN, VSS

## 2017-04-21 NOTE — Op Note (Signed)
[facility] Patient Name: [name] Procedure Date: 04/21/2017 1:21 PM MRN: [id number] Endoscopist: Docia Chuck. Henrene Pastor , MD Age: 67 Referring MD:  Date of Birth: [birth year] Gender: Female Account #: [id number] Procedure:                Colonoscopy, With cold snare polypectomy x 4; with                            biopsies Indications:              Abdominal pain in the left lower quadrant,                            Clinically significant diarrhea of unexplained                            origin, Abnormal CT of the GI tract Medicines:                Monitored Anesthesia Care Procedure:                Pre-Anesthesia Assessment:                           - Prior to the procedure, a History and Physical                            was performed, and patient medications and                            allergies were reviewed. The patient's tolerance of                            previous anesthesia was also reviewed. The risks                            and benefits of the procedure and the sedation                            options and risks were discussed with the patient.                            All questions were answered, and informed consent                            was obtained. Prior Anticoagulants: The patient has                            taken Xarelto (rivaroxaban), last dose was 2 days                            prior to procedure. ASA Grade Assessment: III - A                            patient with severe systemic disease. After  reviewing the risks and benefits, the patient was                            deemed in satisfactory condition to undergo the                            procedure.                           After obtaining informed consent, the colonoscope                            was passed under direct vision. Throughout the                            procedure, the patient's blood pressure, pulse, and                             oxygen saturations were monitored continuously. The                            Colonoscope was introduced through the anus and                            advanced to the the cecum, identified by                            appendiceal orifice and ileocecal valve. The                            ileocecal valve, appendiceal orifice, and rectum                            were photographed. The quality of the bowel                            preparation was excellent. The colonoscopy was                            performed without difficulty. The patient tolerated                            the procedure well. The bowel preparation used was                            SUPREP. Scope In: 1:48:06 PM Scope Out: 2:07:11 PM Scope Withdrawal Time: 0 hours 13 minutes 45 seconds  Total Procedure Duration: 0 hours 19 minutes 5 seconds  Findings:                 Four polyps were found in the descending colon,                            transverse colon and ascending colon. The polyps  were 3 to 5 mm in size. These polyps were removed                            with a cold snare. Resection and retrieval were                            complete.                           Multiple small and large-mouthed diverticula were                            found in the transverse colon and left colon.                           Internal hemorrhoids were found during                            retroflexion. The hemorrhoids were small.                           The entire examined colon appeared normal on direct                            and retroflexion views. Biopsies for histology were                            taken with a cold forceps from the entire colon for                            evaluation of microscopic colitis. Complications:            No immediate complications. Estimated blood loss:                            None. Estimated Blood Loss:     Estimated blood loss:  none. Impression:               - Four 3 to 5 mm polyps in the descending colon, in                            the transverse colon and in the ascending colon,                            removed with a cold snare. Resected and retrieved.                           - Diverticulosis in the transverse colon and in the                            left colon.                           - Internal hemorrhoids.                           -  The entire examined colon is normal on direct and                            retroflexion views. Recommendation:           - Repeat colonoscopy in 3 - 5 years for                            surveillance.                           - Resume Xarelto (rivaroxaban) today at prior dose.                           - Patient has a contact number available for                            emergencies. The signs and symptoms of potential                            delayed complications were discussed with the                            patient. Return to normal activities tomorrow.                            Written discharge instructions were provided to the                            patient.                           - Resume previous diet.                           - Continue present medications.                           - Await pathology results. Docia Chuck. Henrene Pastor, MD 04/21/2017 2:27:33 PM This report has been signed electronically.

## 2017-04-21 NOTE — Patient Instructions (Signed)
YOU HAD AN ENDOSCOPIC PROCEDURE TODAY AT [facility]:   Refer to the procedure report that was given to you for any specific questions about what was found during the examination.  If the procedure report does not answer your questions, please call your gastroenterologist to clarify.  If you requested that your care partner not be given the details of your procedure findings, then the procedure report has been included in a sealed envelope for you to review at your convenience later.  YOU SHOULD EXPECT: Some feelings of bloating in the abdomen. Passage of more gas than usual.  Walking can help get rid of the air that was put into your GI tract during the procedure and reduce the bloating. If you had a lower endoscopy (such as a colonoscopy or flexible sigmoidoscopy) you may notice spotting of blood in your stool or on the toilet paper. If you underwent a bowel prep for your procedure, you may not have a normal bowel movement for a few days.  Please Note:  You might notice some irritation and congestion in your nose or some drainage.  This is from the oxygen used during your procedure.  There is no need for concern and it should clear up in a day or so.  SYMPTOMS TO REPORT IMMEDIATELY:   Following lower endoscopy (colonoscopy or flexible sigmoidoscopy):  Excessive amounts of blood in the stool  Significant tenderness or worsening of abdominal pains  Swelling of the abdomen that is new, acute  Fever of 100F or higher  Please see handouts given to you on Polyps and Hemorrhoids. May resume Xarelto today at prior dose.   For urgent or emergent issues, a gastroenterologist can be reached at any hour by calling [phone].   DIET:  We do recommend a small meal at first, but then you may proceed to your regular diet.  Drink plenty of fluids but you should avoid alcoholic beverages for 24 hours.  ACTIVITY:  You should plan to take it easy for the rest of today and you should NOT  DRIVE or use heavy machinery until tomorrow (because of the sedation medicines used during the test).    FOLLOW UP: Our staff will call the number listed on your records the next business day following your procedure to check on you and address any questions or concerns that you may have regarding the information given to you following your procedure. If we do not reach you, we will leave a message.  However, if you are feeling well and you are not experiencing any problems, there is no need to return our call.  We will assume that you have returned to your regular daily activities without incident.  If any biopsies were taken you will be contacted by phone or by letter within the next 1-3 weeks.  Please call us at [phone] if you have not heard about the biopsies in 3 weeks.    SIGNATURES/CONFIDENTIALITY: You and/or your care partner have signed paperwork which will be entered into your electronic medical record.  These signatures attest to the fact that that the information above on your After Visit Summary has been reviewed and is understood.  Full responsibility of the confidentiality of this discharge information lies with you and/or your care-partner.  Thank you for letting us take care of your healthcare needs today.

## 2017-04-21 NOTE — Progress Notes (Signed)
I have reviewed the patient's medical history in detail and updated the computerized patient record.

## 2017-04-21 NOTE — Progress Notes (Signed)
Called to room to assist during endoscopic procedure.  Patient ID and intended procedure confirmed with present staff. Received instructions for my participation in the procedure from the performing physician.  

## 2017-04-22 ENCOUNTER — Telehealth (HOSPITAL_COMMUNITY): Payer: Self-pay

## 2017-04-22 ENCOUNTER — Telehealth: Payer: Self-pay | Admitting: *Deleted

## 2017-04-22 NOTE — Telephone Encounter (Signed)
Encounter complete. 

## 2017-04-22 NOTE — Telephone Encounter (Signed)
  Follow up Call-  Call back number 04/21/2017  Post procedure Call Back phone  # [phone]  Permission to leave phone message Yes  Some recent data might be hidden     Patient questions:  Do you have a fever, pain , or abdominal swelling? No. Pain Score  0 *  Have you tolerated food without any problems? Yes.    Have you been able to return to your normal activities? Yes.    Do you have any questions about your discharge instructions: Diet   No. Medications  No. Follow up visit  No.  Do you have questions or concerns about your Care? No.  Actions: * If pain score is 4 or above: No action needed, pain <4.pt did say she is still having diarrhea.   Patient was having diarrhea before she had colonoscopy she says .

## 2017-04-26 ENCOUNTER — Encounter: Payer: Self-pay | Admitting: Internal Medicine

## 2017-04-27 ENCOUNTER — Ambulatory Visit (HOSPITAL_COMMUNITY)
Admission: RE | Admit: 2017-04-27 | Discharge: 2017-04-27 | Disposition: A | Discharge status: Home / Self Care | Payer: PPO | Source: Ambulatory Visit | Attending: Cardiology | Admitting: Cardiology

## 2017-04-27 DIAGNOSIS — R002 Palpitations: Secondary | ICD-10-CM | POA: Insufficient documentation

## 2017-04-27 DIAGNOSIS — Z6831 Body mass index (BMI) 31.0-31.9, adult: Secondary | ICD-10-CM | POA: Insufficient documentation

## 2017-04-27 DIAGNOSIS — Z01818 Encounter for other preprocedural examination: Secondary | ICD-10-CM | POA: Diagnosis not present

## 2017-04-27 DIAGNOSIS — E663 Overweight: Secondary | ICD-10-CM | POA: Insufficient documentation

## 2017-04-27 DIAGNOSIS — I252 Old myocardial infarction: Secondary | ICD-10-CM | POA: Diagnosis not present

## 2017-04-27 DIAGNOSIS — E039 Hypothyroidism, unspecified: Secondary | ICD-10-CM | POA: Insufficient documentation

## 2017-04-27 DIAGNOSIS — I10 Essential (primary) hypertension: Secondary | ICD-10-CM | POA: Insufficient documentation

## 2017-04-27 DIAGNOSIS — G4733 Obstructive sleep apnea (adult) (pediatric): Secondary | ICD-10-CM | POA: Insufficient documentation

## 2017-04-27 DIAGNOSIS — R079 Chest pain, unspecified: Secondary | ICD-10-CM

## 2017-04-27 DIAGNOSIS — Z8673 Personal history of transient ischemic attack (TIA), and cerebral infarction without residual deficits: Secondary | ICD-10-CM | POA: Diagnosis not present

## 2017-04-27 DIAGNOSIS — I6529 Occlusion and stenosis of unspecified carotid artery: Secondary | ICD-10-CM | POA: Diagnosis not present

## 2017-04-27 DIAGNOSIS — Z8249 Family history of ischemic heart disease and other diseases of the circulatory system: Secondary | ICD-10-CM | POA: Insufficient documentation

## 2017-04-27 LAB — MYOCARDIAL PERFUSION IMAGING
CHL CUP NUCLEAR SRS: 0
Peak HR: 99 {beats}/min
Rest HR: 75 {beats}/min
SDS: 5
SSS: 5
TID: 1.08

## 2017-04-27 MED ORDER — REGADENOSON 0.4 MG/5ML IV SOLN
0.4000 mg | Freq: Once | INTRAVENOUS | Status: AC
  Administered 2017-04-27: 0.4 mg via INTRAVENOUS

## 2017-04-27 MED ORDER — TECHNETIUM TC 99M TETROFOSMIN IV KIT
32.3000 | PACK | Freq: Once | INTRAVENOUS | Status: AC | PRN
  Administered 2017-04-27: 32.3 via INTRAVENOUS
  Filled 2017-04-27: qty 33

## 2017-04-27 MED ORDER — AMINOPHYLLINE 25 MG/ML IV SOLN
75.0000 mg | Freq: Once | INTRAVENOUS | Status: AC
  Administered 2017-04-27: 75 mg via INTRAVENOUS

## 2017-04-27 MED ORDER — TECHNETIUM TC 99M TETROFOSMIN IV KIT
10.8000 | PACK | Freq: Once | INTRAVENOUS | Status: AC | PRN
  Administered 2017-04-27: 10.8 via INTRAVENOUS
  Filled 2017-04-27: qty 11

## 2017-04-29 ENCOUNTER — Ambulatory Visit (HOSPITAL_COMMUNITY)
Admission: RE | Admit: 2017-04-29 | Discharge: 2017-04-29 | Disposition: A | Discharge status: Home / Self Care | Payer: PPO | Source: Ambulatory Visit | Attending: Cardiology | Admitting: Cardiology

## 2017-04-29 DIAGNOSIS — I10 Essential (primary) hypertension: Secondary | ICD-10-CM | POA: Insufficient documentation

## 2017-04-29 DIAGNOSIS — E785 Hyperlipidemia, unspecified: Secondary | ICD-10-CM | POA: Diagnosis not present

## 2017-04-29 DIAGNOSIS — I6523 Occlusion and stenosis of bilateral carotid arteries: Secondary | ICD-10-CM | POA: Diagnosis not present

## 2017-04-29 DIAGNOSIS — E119 Type 2 diabetes mellitus without complications: Secondary | ICD-10-CM | POA: Diagnosis not present

## 2017-04-29 DIAGNOSIS — I6529 Occlusion and stenosis of unspecified carotid artery: Secondary | ICD-10-CM | POA: Diagnosis not present

## 2017-04-30 ENCOUNTER — Other Ambulatory Visit: Payer: Self-pay

## 2017-04-30 DIAGNOSIS — I6523 Occlusion and stenosis of bilateral carotid arteries: Secondary | ICD-10-CM

## 2017-05-24 ENCOUNTER — Other Ambulatory Visit: Payer: Self-pay | Admitting: Internal Medicine

## 2017-05-24 DIAGNOSIS — Z1231 Encounter for screening mammogram for malignant neoplasm of breast: Secondary | ICD-10-CM

## 2017-06-17 ENCOUNTER — Encounter: Payer: PPO | Admitting: Internal Medicine

## 2017-06-18 ENCOUNTER — Telehealth: Payer: Self-pay

## 2017-06-18 NOTE — Telephone Encounter (Signed)
I called patient for anticoagulation management.   Pt reported that she is having trouble affording her Xarelto and needs help. She states that she is approaching the donut hole and cannot spend $80/bottle. She is almost out of her sample and does not think she has enough to last her until her next follow-up visit at [facility]. She purposely misses 2-3 days of tablets to pull herself over. I advised her to not skip pills and follow her regimen until she runs out.   She also reported that she is experiencing diarrhea 10-12x per day for the past 6 months. She has experienced heavy bleeding in her stools on several occassions. She has occasional nosebleeds, but it is mild and stops within a few minutes.   I have spoken with Dr. Maudie Mercury, and she will schedule Miss Blanch Media for an appointment with Wynell Balloon, PharmD this month for help with Xarelto and medication assistance.   Thank you!  Evangeline Gula, PharmD Candidate

## 2017-06-24 ENCOUNTER — Ambulatory Visit: Payer: PPO | Admitting: Pharmacist

## 2017-06-30 ENCOUNTER — Ambulatory Visit
Admission: RE | Admit: 2017-06-30 | Discharge: 2017-06-30 | Disposition: A | Discharge status: Home / Self Care | Payer: PPO | Source: Ambulatory Visit | Attending: *Deleted | Admitting: *Deleted

## 2017-06-30 DIAGNOSIS — Z1231 Encounter for screening mammogram for malignant neoplasm of breast: Secondary | ICD-10-CM | POA: Diagnosis not present

## 2017-07-05 ENCOUNTER — Encounter: Payer: Self-pay | Admitting: *Deleted

## 2017-07-15 ENCOUNTER — Encounter: Payer: PPO | Admitting: Internal Medicine

## 2017-07-15 ENCOUNTER — Other Ambulatory Visit: Payer: Self-pay

## 2017-07-15 ENCOUNTER — Ambulatory Visit (INDEPENDENT_AMBULATORY_CARE_PROVIDER_SITE_OTHER): Payer: PPO | Admitting: Internal Medicine

## 2017-07-15 VITALS — BP 126/65 | HR 82 | Temp 98.0°F | Ht 63.0 in | Wt 179.2 lb

## 2017-07-15 DIAGNOSIS — K219 Gastro-esophageal reflux disease without esophagitis: Secondary | ICD-10-CM

## 2017-07-15 DIAGNOSIS — I10 Essential (primary) hypertension: Secondary | ICD-10-CM

## 2017-07-15 DIAGNOSIS — Z79899 Other long term (current) drug therapy: Secondary | ICD-10-CM

## 2017-07-15 DIAGNOSIS — Z7901 Long term (current) use of anticoagulants: Secondary | ICD-10-CM | POA: Diagnosis not present

## 2017-07-15 DIAGNOSIS — E785 Hyperlipidemia, unspecified: Secondary | ICD-10-CM

## 2017-07-15 DIAGNOSIS — E039 Hypothyroidism, unspecified: Secondary | ICD-10-CM

## 2017-07-15 DIAGNOSIS — F329 Major depressive disorder, single episode, unspecified: Secondary | ICD-10-CM

## 2017-07-15 DIAGNOSIS — I482 Chronic atrial fibrillation, unspecified: Secondary | ICD-10-CM

## 2017-07-15 DIAGNOSIS — I4891 Unspecified atrial fibrillation: Secondary | ICD-10-CM | POA: Diagnosis not present

## 2017-07-15 DIAGNOSIS — F32A Depression, unspecified: Secondary | ICD-10-CM

## 2017-07-15 MED ORDER — ATENOLOL 25 MG PO TABS: ORAL_TABLET | ORAL | 1 refills | Status: DC

## 2017-07-15 MED ORDER — LEVOTHYROXINE SODIUM 50 MCG PO TABS: 50.0000 ug | ORAL_TABLET | Freq: Every morning | ORAL | 1 refills | Status: DC

## 2017-07-15 MED ORDER — DILTIAZEM HCL ER COATED BEADS 180 MG PO CP24: 180.0000 mg | ORAL_CAPSULE | Freq: Every day | ORAL | 1 refills | Status: DC

## 2017-07-15 MED ORDER — ROSUVASTATIN CALCIUM 20 MG PO TABS: ORAL_TABLET | ORAL | 0 refills | Status: DC

## 2017-07-15 MED ORDER — FLUOXETINE HCL 20 MG PO TABS: 20.0000 mg | ORAL_TABLET | Freq: Every day | ORAL | 3 refills | Status: DC

## 2017-07-15 MED ORDER — RIVAROXABAN 20 MG PO TABS: ORAL_TABLET | ORAL | 1 refills | Status: DC

## 2017-07-15 MED ORDER — FAMOTIDINE 20 MG PO TABS: 20.0000 mg | ORAL_TABLET | Freq: Every day | ORAL | 1 refills | Status: DC

## 2017-07-15 MED ORDER — LOSARTAN POTASSIUM 25 MG PO TABS: 12.5000 mg | ORAL_TABLET | Freq: Every day | ORAL | 1 refills | Status: DC

## 2017-07-15 NOTE — Patient Instructions (Signed)
It was a pleasure to meet you today [name].  I have refilled all of her active prescriptions for the next 6 months.  I recommend following up with [place] by around that time she can visit with your primary doctor and make sure you are caught up on preventative healthcare.  If you have any troubles in the meantime please call us.

## 2017-07-15 NOTE — Progress Notes (Signed)
   CC: Presented for a routine follow-up of her A. fib and medication refill  HPI:  [name] is a 67 y.o. female with PMHx detailed below presenting in her usual health today requesting medication refills.  She denies any recent history of dyspnea, chest pain, or severe heart palpitations.  She has had no sudden change in fatigue or exertion tolerance.  She continues to take Xarelto 20 mg daily with meals and has not had any problems with bruising bleeding or medication intolerance.  See problem based assessment and plan below for additional details.  Atrial fibrillation She is currently feeling well and tolerating her current medicines with no complaints.  She has not had any recent trouble with access to her medication.  She is also been helped out with this at cardiology clinic.  She remains asymptomatic. Plan: Continue diltiazem 180 mg daily Continue Xarelto 20 g daily   Past Medical History:  Diagnosis Date  . Atrial fibrillation ([place])   . Bunion   . Carotid stenosis   . Carpal tunnel syndrome    mild   . Cholecystitis    s/p cholecystectomy  . Chronic back pain   . Chronic gastritis   . Depression   . Diabetic peripheral neuropathy ([place])   . Digoxin toxicity    08/2011  . Diverticulitis    dx 04/08/14  . Diverticulosis   . Dysphagia    no documented strictures but responded positively to dilation in past.   . Epileptic seizure, tonic ([place])    .No meds since age of 50.  . Family history of adverse reaction to anesthesia    "my mother may have"  . Fatty liver   . Fibromyalgia   . GERD (gastroesophageal reflux disease)   . H/O hiatal hernia    S/P hernia repair  . Hyperlipidemia   . Hypertension   . Hypothyroid   . Internal hemorrhoid   . Mild cognitive impairment   . OSA (obstructive sleep apnea) dx'd 2003   "can't sleep w/that mask; lost some weight; maybe that's helped" (04/26/2014)  . Osteoarthritis   . Pneumonia X 2  . Sarcoidosis   . Skin neoplasm    . TIA (transient ischemic attack)    "don't know when I had it" (04/26/2014)  . Transaminase or LDH elevation   . Type II diabetes mellitus ([place])     Review of Systems: Review of Systems  Constitutional: Negative for malaise/fatigue.  Respiratory: Negative for shortness of breath.   Cardiovascular: Negative for chest pain and palpitations.  Gastrointestinal: Negative for blood in stool.  Skin: Negative for rash.  Endo/Heme/Allergies: Does not bruise/bleed easily.     Physical Exam: Vitals:   07/15/17 0910  BP: 126/65  Pulse: 82  Temp: 98 F (36.7 C)  TempSrc: Oral  SpO2: 99%  Weight: 179 lb 3.2 oz (81.3 kg)  Height: 5\' 3"  (1.6 m)   GENERAL- alert, co-operative, NAD HEENT- Oral mucosa appears moist, no oropharyngeal erythema CARDIAC- RRR, no murmurs, rubs or gallops. RESP- CTAB, no wheezes or crackles. EXTREMITIES- No pedal edema. SKIN- Warm, dry, No rash or lesion. PSYCH- Normal mood and affect, appropriate thought content and speech.   Assessment & Plan:   See encounters tab for problem based medical decision making.   Patient discussed with Dr. Beryle Beams

## 2017-07-16 ENCOUNTER — Encounter: Payer: PPO | Admitting: Internal Medicine

## 2017-07-18 ENCOUNTER — Encounter: Payer: Self-pay | Admitting: Cardiology

## 2017-07-18 NOTE — Progress Notes (Signed)
Cardiology Office Note   Date:  07/19/2017   ID:  [name], DOB 05/10/1950, MRN [id number]  PCP:  Ina Homes, MD  Cardiologist:   Minus Breeding, MD    Chief Complaint  Patient presents with  . Atrial Fibrillation      History of Present Illness: [name] is a 67 y.o. female who presents for follow-up of atrial fibrillation. She has permanent atrial fibrillation.  When she was last seen in the clinic she was having chest pain.  She had a normal perfusion study in June.  Since that time she has done well. The patient denies any new symptoms such as chest discomfort, neck or arm discomfort. There has been no new shortness of breath, PND or orthopnea. There have been no reported palpitations, presyncope or syncope.   She is walking for exercise and enjoys this and has no symptoms.    Past Medical History:  Diagnosis Date  . Atrial fibrillation ([place])   . Bunion   . Carotid stenosis   . Carpal tunnel syndrome    mild   . Cholecystitis    s/p cholecystectomy  . Chronic back pain   . Chronic gastritis   . Depression   . Diabetic peripheral neuropathy ([place])   . Digoxin toxicity    08/2011  . Diverticulitis    dx 04/08/14  . Diverticulosis   . Dysphagia    no documented strictures but responded positively to dilation in past.   . Epileptic seizure, tonic ([place])    .No meds since age of 39.  . Family history of adverse reaction to anesthesia    "my mother may have"  . Fatty liver   . Fibromyalgia   . GERD (gastroesophageal reflux disease)   . H/O hiatal hernia    S/P hernia repair  . Hyperlipidemia   . Hypertension   . Hypothyroid   . Internal hemorrhoid   . Mild cognitive impairment   . OSA (obstructive sleep apnea) dx'd 2003   "can't sleep w/that mask; lost some weight; maybe that's helped" (04/26/2014)  . Osteoarthritis   . Pneumonia X 2  . Sarcoidosis   . Skin neoplasm   . TIA (transient ischemic attack)    "don't know when I had it" (04/26/2014)   . Transaminase or LDH elevation   . Type II diabetes mellitus ([place])     Past Surgical History:  Procedure Laterality Date  . ABDOMINAL HYSTERECTOMY  1980's   partial  . BILATERAL OOPHORECTOMY  1980's   "after partial hysterectomy"  . BREAST BIOPSY Right    benign  . BREAST EXCISIONAL BIOPSY Right 03/2006   benign  . BREAST SURGERY Right ~ 2010   excision milk duct;  . CATARACT EXTRACTION W/PHACO  10/27/2010   Procedure: CATARACT EXTRACTION PHACO AND INTRAOCULAR LENS PLACEMENT (IOC);  Surgeon: Williams Che;  Location: [facility];  Service: Ophthalmology;  Laterality: Left;  CDE- 1.78  . CATARACT EXTRACTION W/PHACO  07/13/2011   Procedure: CATARACT EXTRACTION PHACO AND INTRAOCULAR LENS PLACEMENT (IOC);  Surgeon: Williams Che, MD;  Location: [facility];  Service: Ophthalmology;  Laterality: Right;  CDE:  1.65  . DILATION AND CURETTAGE OF UTERUS  1970; 1976  . ESOPHAGEAL DILATION  multiple  . FOOT SURGERY Left ~ 2011   "straightened toe & scraped bone below big toe"  . FOOT SURGERY Right ~ 2011   "scraped bone of big toe; shortened middle toet"  . HIATAL HERNIA REPAIR  1990   "  had to have scar tissue removed 6 months after repair"  . Inverted nipples  1992  . LAPAROSCOPIC CHOLECYSTECTOMY  1980's  . LIPOSUCTION  1992  . SALPINGOOPHORECTOMY Bilateral    "6 months or so after hysterectomy"  . SKIN CANCER EXCISION  1990's   "front side of right shin"  . TUBAL LIGATION  1976  . VENTRAL HERNIA REPAIR  07/29/2011   Procedure: LAPAROSCOPIC VENTRAL HERNIA;  Surgeon: Adin Hector, MD;  Location: [facility];  Service: General;  Laterality: N/A;  multiple incarcerated hernias with mesh     Current Outpatient Medications  Medication Sig Dispense Refill  . atenolol (TENORMIN) 25 MG tablet TAKE 1 AND 1/2 TABLET TWICE A DAY 270 tablet 1  . Cholecalciferol ([employer] VITAMIN D3) 1000 units capsule Take 1 capsule (1,000 Units total) by mouth daily. (Patient taking differently: Take 2,000 Units by mouth  daily. ) 90 capsule 1  . diltiazem (CARTIA XT) 180 MG 24 hr capsule Take 1 capsule (180 mg total) by mouth daily. 90 capsule 1  . famotidine (PEPCID) 20 MG tablet Take 1 tablet (20 mg total) by mouth daily. 90 tablet 1  . FLUoxetine (PROZAC) 20 MG tablet Take 1 tablet (20 mg total) by mouth daily. 90 tablet 3  . levothyroxine (SYNTHROID, LEVOTHROID) 50 MCG tablet Take 1 tablet (50 mcg total) by mouth every morning. 90 tablet 1  . losartan (COZAAR) 25 MG tablet Take 0.5 tablets (12.5 mg total) by mouth daily. 45 tablet 1  . rivaroxaban (XARELTO) 20 MG TABS tablet TAKE 1 TABLET DAILY WITH SUPPER 90 tablet 1  . rosuvastatin (CRESTOR) 20 MG tablet TAKE ONE (1) TABLET EACH DAY 90 tablet 0   No current facility-administered medications for this visit.     Allergies:   Gemfibrozil; Latex; Penicillins; Adhesive [tape]; Codeine; Digoxin and related; Metformin and related; Omeprazole; and Ace inhibitors    ROS:  Please see the history of present illness.   Otherwise, review of systems are positive for none.   All other systems are reviewed and negative.    PHYSICAL EXAM: VS:  BP 138/82   Pulse 81   Ht 5\' 3"  (1.6 m)   Wt 177 lb 12.8 oz (80.6 kg)   LMP 06/03/1978   SpO2 94%   BMI 31.50 kg/m  , BMI Body mass index is 31.5 kg/m.  GENERAL:  Well appearing NECK:  No jugular venous distention, waveform within normal limits, carotid upstroke brisk and symmetric, no bruits, no thyromegaly LUNGS:  Clear to auscultation bilaterally CHEST:  Unremarkable HEART:  PMI not displaced or sustained,S1 and S2 within normal limits, no S3, no clicks, no rubs, no murmurs ABD:  Flat, positive bowel sounds normal in frequency in pitch, no bruits, no rebound, no guarding, no midline pulsatile mass, no hepatomegaly, no splenomegaly EXT:  2 plus pulses throughout, no edema, no cyanosis no clubbing   Recent Labs: 03/10/2017: ALT 16; BUN 11; Creatinine, Ser 0.83; Potassium 4.0; Sodium 138 04/15/2017: Hemoglobin 14.8;  Platelets 218    Lipid Panel    Component Value Date/Time   CHOL 133 04/19/2014 1603   TRIG 181 (H) 04/19/2014 1603   HDL 46 04/19/2014 1603   CHOLHDL 2.9 04/19/2014 1603   VLDL 36 04/19/2014 1603   LDLCALC 51 04/19/2014 1603      Wt Readings from Last 3 Encounters:  07/19/17 177 lb 12.8 oz (80.6 kg)  07/15/17 179 lb 3.2 oz (81.3 kg)  04/27/17 177 lb (80.3 kg)  Other studies Reviewed: Additional studies/ records that were reviewed today include:  Lexiscan Myoview. Review of the above records demonstrates:  As above.   ASSESSMENT AND PLAN:  ATRIAL FIB:   [name] has a CHA2DS2 - VASc score of 4 with a risk of stroke of 4%.  The patient  tolerates this rhythm and rate control and anticoagulation. We will continue with the meds as listed.  CAROTID STENOSIS:  He had 40 - 59% stenosis of the right carotid in April of this year.       Current medicines are reviewed at length with the patient today.  The patient does not have concerns regarding medicines.  The following changes have been made:    None   Labs/ tests ordered today include: None    Disposition:   FU with me in one year.     Signed, Minus Breeding, MD  07/19/2017 11:21 AM    [facility]

## 2017-07-19 ENCOUNTER — Ambulatory Visit (INDEPENDENT_AMBULATORY_CARE_PROVIDER_SITE_OTHER): Payer: PPO | Admitting: Cardiology

## 2017-07-19 ENCOUNTER — Encounter: Payer: Self-pay | Admitting: Cardiology

## 2017-07-19 VITALS — BP 138/82 | HR 81 | Ht 63.0 in | Wt 177.8 lb

## 2017-07-19 DIAGNOSIS — I6523 Occlusion and stenosis of bilateral carotid arteries: Secondary | ICD-10-CM

## 2017-07-19 DIAGNOSIS — I482 Chronic atrial fibrillation, unspecified: Secondary | ICD-10-CM

## 2017-07-19 NOTE — Patient Instructions (Signed)
Medication Instructions:  Continue current medications  If you need a refill on your cardiac medications before your next appointment, please call your pharmacy.  Labwork: None ordered  Testing/Procedures: None Ordered   Follow-Up: Your physician wants you to follow-up in: 1 Year. You should receive a reminder letter in the mail two months in advance. If you do not receive a letter, please call our office [phone].    Thank you for choosing [facility]!!      

## 2017-07-19 NOTE — Progress Notes (Signed)
Medicine attending: Medical history, presenting problems, physical findings, and medications, reviewed with resident physician Dr Christopher Rice on the day of the patient visit and I concur with his evaluation and management plan. 

## 2017-07-19 NOTE — Assessment & Plan Note (Signed)
She is currently feeling well and tolerating her current medicines with no complaints.  She has not had any recent trouble with access to her medication.  She is also been helped out with this at cardiology clinic.  She remains asymptomatic. Plan: Continue diltiazem 180 mg daily Continue Xarelto 20 g daily

## 2017-07-20 DIAGNOSIS — E119 Type 2 diabetes mellitus without complications: Secondary | ICD-10-CM | POA: Diagnosis not present

## 2017-07-20 DIAGNOSIS — H26493 Other secondary cataract, bilateral: Secondary | ICD-10-CM | POA: Diagnosis not present

## 2017-07-20 DIAGNOSIS — Z961 Presence of intraocular lens: Secondary | ICD-10-CM | POA: Diagnosis not present

## 2017-07-20 DIAGNOSIS — H10413 Chronic giant papillary conjunctivitis, bilateral: Secondary | ICD-10-CM | POA: Diagnosis not present

## 2017-07-20 DIAGNOSIS — H04123 Dry eye syndrome of bilateral lacrimal glands: Secondary | ICD-10-CM | POA: Diagnosis not present

## 2017-07-20 LAB — HM DIABETES EYE EXAM

## 2017-08-10 ENCOUNTER — Encounter: Payer: Self-pay | Admitting: *Deleted

## 2017-08-25 ENCOUNTER — Encounter: Payer: Self-pay | Admitting: *Deleted

## 2017-10-16 ENCOUNTER — Other Ambulatory Visit: Payer: Self-pay | Admitting: Internal Medicine

## 2017-12-14 ENCOUNTER — Ambulatory Visit: Payer: PPO | Admitting: Pharmacist

## 2017-12-14 DIAGNOSIS — I4891 Unspecified atrial fibrillation: Secondary | ICD-10-CM

## 2017-12-15 NOTE — Progress Notes (Signed)
Patient presents for help with medication access, provided Xarelto samples and referred patient to Medicare Extra Help.

## 2018-01-13 ENCOUNTER — Ambulatory Visit: Payer: PPO | Admitting: Pharmacist

## 2018-01-13 ENCOUNTER — Encounter: Payer: Self-pay | Admitting: Internal Medicine

## 2018-01-13 ENCOUNTER — Ambulatory Visit (INDEPENDENT_AMBULATORY_CARE_PROVIDER_SITE_OTHER): Payer: PPO | Admitting: Internal Medicine

## 2018-01-13 VITALS — BP 126/56 | HR 96 | Temp 98.8°F | Ht 63.0 in | Wt 173.2 lb

## 2018-01-13 DIAGNOSIS — Z7901 Long term (current) use of anticoagulants: Secondary | ICD-10-CM

## 2018-01-13 DIAGNOSIS — E785 Hyperlipidemia, unspecified: Secondary | ICD-10-CM

## 2018-01-13 DIAGNOSIS — E1121 Type 2 diabetes mellitus with diabetic nephropathy: Secondary | ICD-10-CM

## 2018-01-13 DIAGNOSIS — F329 Major depressive disorder, single episode, unspecified: Secondary | ICD-10-CM

## 2018-01-13 DIAGNOSIS — Z79899 Other long term (current) drug therapy: Secondary | ICD-10-CM | POA: Diagnosis not present

## 2018-01-13 DIAGNOSIS — Z79890 Hormone replacement therapy (postmenopausal): Secondary | ICD-10-CM | POA: Diagnosis not present

## 2018-01-13 DIAGNOSIS — I10 Essential (primary) hypertension: Secondary | ICD-10-CM | POA: Diagnosis not present

## 2018-01-13 DIAGNOSIS — I4891 Unspecified atrial fibrillation: Secondary | ICD-10-CM | POA: Diagnosis not present

## 2018-01-13 DIAGNOSIS — E039 Hypothyroidism, unspecified: Secondary | ICD-10-CM

## 2018-01-13 DIAGNOSIS — I482 Chronic atrial fibrillation, unspecified: Secondary | ICD-10-CM

## 2018-01-13 DIAGNOSIS — K219 Gastro-esophageal reflux disease without esophagitis: Secondary | ICD-10-CM

## 2018-01-13 DIAGNOSIS — E119 Type 2 diabetes mellitus without complications: Secondary | ICD-10-CM | POA: Diagnosis not present

## 2018-01-13 DIAGNOSIS — E559 Vitamin D deficiency, unspecified: Secondary | ICD-10-CM | POA: Diagnosis not present

## 2018-01-13 DIAGNOSIS — R51 Headache: Secondary | ICD-10-CM

## 2018-01-13 DIAGNOSIS — R519 Headache, unspecified: Secondary | ICD-10-CM

## 2018-01-13 DIAGNOSIS — Z23 Encounter for immunization: Secondary | ICD-10-CM | POA: Diagnosis not present

## 2018-01-13 DIAGNOSIS — F32A Depression, unspecified: Secondary | ICD-10-CM

## 2018-01-13 LAB — GLUCOSE, CAPILLARY: Glucose-Capillary: 94 mg/dL (ref 70–99)

## 2018-01-13 LAB — POCT GLYCOSYLATED HEMOGLOBIN (HGB A1C): Hemoglobin A1C: 6.5 % — AB (ref 4.0–5.6)

## 2018-01-13 MED ORDER — CHOLECALCIFEROL 25 MCG (1000 UT) PO CAPS: 2000.0000 [IU] | ORAL_CAPSULE | Freq: Every day | ORAL | 1 refills | Status: DC

## 2018-01-13 MED ORDER — ATENOLOL 25 MG PO TABS: ORAL_TABLET | ORAL | 1 refills | Status: DC

## 2018-01-13 MED ORDER — FLUOXETINE HCL 20 MG PO TABS: 20.0000 mg | ORAL_TABLET | Freq: Every day | ORAL | 1 refills | Status: DC

## 2018-01-13 MED ORDER — RIVAROXABAN 20 MG PO TABS: ORAL_TABLET | ORAL | 1 refills | Status: DC

## 2018-01-13 MED ORDER — LEVOTHYROXINE SODIUM 50 MCG PO TABS: 50.0000 ug | ORAL_TABLET | Freq: Every morning | ORAL | 1 refills | Status: DC

## 2018-01-13 MED ORDER — LOSARTAN POTASSIUM 25 MG PO TABS: 12.5000 mg | ORAL_TABLET | Freq: Every day | ORAL | 1 refills | Status: DC

## 2018-01-13 MED ORDER — DILTIAZEM HCL ER COATED BEADS 180 MG PO CP24: 180.0000 mg | ORAL_CAPSULE | Freq: Every day | ORAL | 1 refills | Status: DC

## 2018-01-13 MED ORDER — ROSUVASTATIN CALCIUM 20 MG PO TABS: 20.0000 mg | ORAL_TABLET | Freq: Every day | ORAL | 1 refills | Status: DC

## 2018-01-13 MED ORDER — FAMOTIDINE 20 MG PO TABS: 20.0000 mg | ORAL_TABLET | Freq: Every day | ORAL | 1 refills | Status: DC

## 2018-01-13 NOTE — Assessment & Plan Note (Signed)
BP currently at goal. On Atenolol, diltiazem, and losartan. No side effects. No orthostatic symptoms. Needs a BMP to assess renal function and potassium. Refills for medications sent.

## 2018-01-13 NOTE — Assessment & Plan Note (Signed)
Patient with 2 months of bilateral stabbing headaches. Initially were increasing in frequency but now decreasing. Concerned for CVA. She has tried tylenol without relief. No other medications tried. History of headaches but these are different. She denies visual changes, weakness, numbness, N/V, rhinorrhea, lacrimation, keeping her awake or waking her up. PE is reassuring without focal neuro deficits. Discussed work-up for new headaches and possibility of GCA. Since headaches are decreasing in frequency we will continue to monitor. If become more frequent, she begins to have visual symptoms, or PE changes we will pursue CT head and consider starting steroids if indicated.

## 2018-01-13 NOTE — Progress Notes (Signed)
Medicine attending: Medical history, presenting problems, physical findings, and medications, reviewed with resident physician Dr Justin Helberg on the day of the patient visit and I concur with his evaluation and management plan. 

## 2018-01-13 NOTE — Assessment & Plan Note (Signed)
Refill for crestor sent.

## 2018-01-13 NOTE — Assessment & Plan Note (Signed)
Patient with diet controlled DM. Last A1c 6.4. Will repeat today. On crestor. On losartan. Last eye exam 07/2017. Foot exam today. Influenza vaccine given.

## 2018-01-13 NOTE — Assessment & Plan Note (Addendum)
Patient on levothyroxine 50 mcg QD. She denies signs or symptoms of hyper/hypothyroidism. TSH last checked >12 months ago. Will check TSH today. Refills sent.

## 2018-01-13 NOTE — Assessment & Plan Note (Signed)
Refill for vitamin D sent.

## 2018-01-13 NOTE — Progress Notes (Signed)
   CC: Headaches  HPI:  [name] is a 67 y.o. female who presented to the clinic for continued evaluation and management of her chronic medical illnesses. For a detailed assessment and plan please refer to problem based charting below.   Past Medical History:  Diagnosis Date  . Atrial fibrillation ([place])   . Bunion   . Carotid stenosis   . Carpal tunnel syndrome    mild   . Cholecystitis    s/p cholecystectomy  . Chronic back pain   . Chronic gastritis   . Depression   . Diabetic peripheral neuropathy ([place])   . Digoxin toxicity    08/2011  . Diverticulitis    dx 04/08/14  . Diverticulosis   . Dysphagia    no documented strictures but responded positively to dilation in past.   . 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  . Family history of adverse reaction to anesthesia    "my mother may have"  . Fatty liver   . Fibromyalgia   . GERD (gastroesophageal reflux disease)   . H/O hiatal hernia    S/P hernia repair  . Hyperlipidemia   . Hypertension   . Hypothyroid   . Internal hemorrhoid   . Mild cognitive impairment   . OSA (obstructive sleep apnea) dx'd 2003   "can't sleep w/that mask; lost some weight; maybe that's helped" (04/26/2014)  . Osteoarthritis   . Pneumonia X 2  . Sarcoidosis   . Skin neoplasm   . TIA (transient ischemic attack)    "don't know when I had it" (04/26/2014)  . Transaminase or LDH elevation   . Type II diabetes mellitus ([place])    Review of Systems:  12 point ROS preformed. All negative aside from those mentioned in the HPI.  Physical Exam: Vitals:   01/13/18 1355  BP: (!) 126/56  Pulse: 96  Temp: 98.8 F (37.1 C)  TempSrc: Oral  SpO2: 98%  Weight: 173 lb 3.2 oz (78.6 kg)  Height: 5\' 3"  (1.6 m)   General: Well nourished female in no acute distress HENT: Normocephalic, atraumatic, moist mucus membranes, no temporal tenderness to palpation  Pulm: Good air movement with no wheezing or crackles  CV: RRR, no  murmurs, no rubs  Neuro: Alert and oriented x 3, cranial nerves II-XII intact bilaterally, gross strength 5/5 in all extremities, sensation to light touch intact in all extremities, normal gait  Assessment & Plan:   See Encounters Tab for problem based charting.  Patient discussed with Dr. Beryle Beams

## 2018-01-13 NOTE — Patient Instructions (Signed)
Thank you for allowing [place] to provide your care. Today we are not making any changes to your medications but are getting some blood work. I will call you if anything is abnormal or we need to change anything. Please come back to see me in 3-6 months.

## 2018-01-13 NOTE — Assessment & Plan Note (Signed)
Follows with cardiology. Rate control with atenolol and diltiazem. Anticoagulation with xarelto. CHADsVASC 4. Complaint with all her medications. Current rate controlled. Refills for medication sent.

## 2018-01-14 LAB — BMP8+ANION GAP
ANION GAP: 17 mmol/L (ref 10.0–18.0)
BUN/Creatinine Ratio: 16 (ref 12–28)
BUN: 13 mg/dL (ref 8–27)
CALCIUM: 9.5 mg/dL (ref 8.7–10.3)
CO2: 24 mmol/L (ref 20–29)
CREATININE: 0.81 mg/dL (ref 0.57–1.00)
Chloride: 106 mmol/L (ref 96–106)
GFR calc Af Amer: 86 mL/min/{1.73_m2} (ref >59)
GFR, EST NON AFRICAN AMERICAN: 74 mL/min/{1.73_m2} (ref >59)
Glucose: 94 mg/dL (ref 65–99)
POTASSIUM: 4.2 mmol/L (ref 3.5–5.2)
Sodium: 147 mmol/L — ABNORMAL HIGH (ref 134–144)

## 2018-01-14 LAB — LIPID PANEL
CHOLESTEROL TOTAL: 109 mg/dL (ref 100–199)
Chol/HDL Ratio: 2.1 ratio (ref 0.0–4.4)
HDL: 52 mg/dL (ref >39)
LDL Calculated: 27 mg/dL (ref 0–99)
TRIGLYCERIDES: 150 mg/dL — AB (ref 0–149)
VLDL CHOLESTEROL CAL: 30 mg/dL (ref 5–40)

## 2018-01-14 LAB — TSH: TSH: 0.846 u[IU]/mL (ref 0.450–4.500)

## 2018-01-24 NOTE — Progress Notes (Signed)
S: [name] is a 67 y.o. female reports to clinical pharmacist appointment for medication help.  Allergies  Allergen Reactions  . Gemfibrozil Swelling    REACTION: Angioedema  . Latex Other (See Comments)    Blisters where touched or applied  . Penicillins Hives    Will spread in patches all over the body.  . Adhesive [Tape] Other (See Comments)    Will blister skin where applied - do not use BAND-AIDS.  Marland Kitchen Codeine Hives    Will spread in patches all over the body.  . Digoxin And Related     Makes BP drop  . Metformin And Related Diarrhea  . Omeprazole Diarrhea  . Ace Inhibitors Rash    Cough and rash    Medication Sig  atenolol (TENORMIN) 25 MG tablet TAKE 1 AND 1/2 TABLET TWICE A DAY  Cholecalciferol ([employer]S VITAMIN D3) 25 MCG (1000 UT) capsule Take 2 capsules (2,000 Units total) by mouth daily.  diltiazem (CARTIA XT) 180 MG 24 hr capsule Take 1 capsule (180 mg total) by mouth daily.  famotidine (PEPCID) 20 MG tablet Take 1 tablet (20 mg total) by mouth daily.  FLUoxetine (PROZAC) 20 MG tablet Take 1 tablet (20 mg total) by mouth daily.  levothyroxine (SYNTHROID, LEVOTHROID) 50 MCG tablet Take 1 tablet (50 mcg total) by mouth every morning.  losartan (COZAAR) 25 MG tablet Take 0.5 tablets (12.5 mg total) by mouth daily.  rivaroxaban (XARELTO) 20 MG TABS tablet TAKE 1 TABLET DAILY WITH SUPPER  rosuvastatin (CRESTOR) 20 MG tablet Take 1 tablet (20 mg total) by mouth daily.   Past Medical History:  Diagnosis Date  . Atrial fibrillation ([place])   . Bunion   . Carotid stenosis   . Carpal tunnel syndrome    mild   . Cholecystitis    s/p cholecystectomy  . Chronic back pain   . Chronic gastritis   . Depression   . Diabetic peripheral neuropathy ([place])   . Digoxin toxicity    08/2011  . Diverticulitis    dx 04/08/14  . Diverticulosis   . Dysphagia    no documented strictures but responded positively to dilation in past.   . Epileptic seizure, tonic ([place])    .No meds  since age of 51.  . Family history of adverse reaction to anesthesia    "my mother may have"  . Fatty liver   . Fibromyalgia   . GERD (gastroesophageal reflux disease)   . H/O hiatal hernia    S/P hernia repair  . Hyperlipidemia   . Hypertension   . Hypothyroid   . Internal hemorrhoid   . Mild cognitive impairment   . OSA (obstructive sleep apnea) dx'd 2003   "can't sleep w/that mask; lost some weight; maybe that's helped" (04/26/2014)  . Osteoarthritis   . Pneumonia X 2  . Sarcoidosis   . Skin neoplasm   . TIA (transient ischemic attack)    "don't know when I had it" (04/26/2014)  . Transaminase or LDH elevation   . Type II diabetes mellitus ([place])    Social History   Socioeconomic History  . Marital status: Divorced    Spouse name: Not on file  . Number of children: 2  . Years of education: 12th   . Highest education level: Not on file  Occupational History  . Occupation: Medical laboratory scientific officer: DISABLED  Social Needs  . Financial resource strain: Not on file  . Food insecurity:    Worry: Not  on file    Inability: Not on file  . Transportation needs:    Medical: Not on file    Non-medical: Not on file  Tobacco Use  . Smoking status: Never Smoker  . Smokeless tobacco: Never Used  Substance and Sexual Activity  . Alcohol use: No    Alcohol/week: 0.0 standard drinks  . Drug use: No  . Sexual activity: Never  Lifestyle  . Physical activity:    Days per week: Not on file    Minutes per session: Not on file  . Stress: Not on file  Relationships  . Social connections:    Talks on phone: Not on file    Gets together: Not on file    Attends religious service: Not on file    Active member of club or organization: Not on file    Attends meetings of clubs or organizations: Not on file    Relationship status: Not on file  Other Topics Concern  . Not on file  Social History Narrative   Divorced, on disability, has 2 kids (son in [place], daughter in [place]), owns a house  in [place], [place] and has a female roommate    No caffeine    Family History  Problem Relation Age of Onset  . Crohn's disease Mother   . Colitis Mother        Crohns  . Anesthesia problems Mother   . Arthritis Mother        rheumatoid; maternal side of family also with RA  . Diabetes Father   . Heart disease Father   . Melanoma Father   . Coronary artery disease Father   . Skin cancer Father   . Diabetes Sister   . Depression Maternal Uncle   . Dementia Maternal Uncle   . Colon cancer Paternal Uncle        ? if stomach or colon   . Hypotension Neg Hx   . Malignant hyperthermia Neg Hx   . Pseudochol deficiency Neg Hx    O: Component Value Date/Time   CHOL 109 01/13/2018 1431   HDL 52 01/13/2018 1431   TRIG 150 (H) 01/13/2018 1431   AST 21 03/10/2017 1317   ALT 16 03/10/2017 1317   NA 147 (H) 01/13/2018 1431   K 4.2 01/13/2018 1431   CL 106 01/13/2018 1431   CO2 24 01/13/2018 1431   GLUCOSE 94 01/13/2018 1431   GLUCOSE 115 (H) 03/10/2017 1317   HGBA1C 6.5 (A) 01/13/2018 1404   HGBA1C 6.3 (H) 07/29/2011 1422   BUN 13 01/13/2018 1431   CREATININE 0.81 01/13/2018 1431   CREATININE 0.80 05/24/2014 1608   CALCIUM 9.5 01/13/2018 1431   GFRNONAA 74 01/13/2018 1431   GFRNONAA 78 05/24/2014 1608   GFRAA 86 01/13/2018 1431   GFRAA 89 05/24/2014 1608   WBC 7.9 04/15/2017 1215   WBC 9.4 03/10/2017 1317   HGB 14.8 04/15/2017 1215   HCT 43.2 04/15/2017 1215   PLT 218 04/15/2017 1215   TSH 0.846 01/13/2018 1431   Ht Readings from Last 2 Encounters:  01/13/18 5\' 3"  (1.6 m)  07/19/17 5\' 3"  (1.6 m)   Wt Readings from Last 2 Encounters:  01/13/18 173 lb 3.2 oz (78.6 kg)  07/19/17 177 lb 12.8 oz (80.6 kg)   There is no height or weight on file to calculate BMI. BP Readings from Last 3 Encounters:  01/13/18 (!) 126/56  07/19/17 138/82  07/15/17 126/65    A/P: Patient unable to access  Xarelto, referred patient to Medicare Extra Help and provided samples.  An after visit  summary was provided and patient advised to follow up if any changes in condition or questions regarding medications arise.   The patient verbalized understanding of information provided by repeating back concepts discussed.

## 2018-03-09 DIAGNOSIS — U071 COVID-19: Secondary | ICD-10-CM

## 2018-03-09 HISTORY — DX: COVID-19: U07.1

## 2018-03-17 ENCOUNTER — Other Ambulatory Visit: Payer: Self-pay

## 2018-03-17 ENCOUNTER — Ambulatory Visit (INDEPENDENT_AMBULATORY_CARE_PROVIDER_SITE_OTHER): Payer: PPO | Admitting: Internal Medicine

## 2018-03-17 ENCOUNTER — Encounter: Payer: Self-pay | Admitting: Internal Medicine

## 2018-03-17 VITALS — BP 119/68 | HR 66 | Temp 98.2°F | Ht 63.0 in | Wt 176.3 lb

## 2018-03-17 DIAGNOSIS — G44229 Chronic tension-type headache, not intractable: Secondary | ICD-10-CM | POA: Diagnosis not present

## 2018-03-17 DIAGNOSIS — G44221 Chronic tension-type headache, intractable: Secondary | ICD-10-CM | POA: Diagnosis not present

## 2018-03-17 DIAGNOSIS — Z7901 Long term (current) use of anticoagulants: Secondary | ICD-10-CM | POA: Diagnosis not present

## 2018-03-17 LAB — GLUCOSE, CAPILLARY: Glucose-Capillary: 101 mg/dL — ABNORMAL HIGH (ref 70–99)

## 2018-03-17 NOTE — Progress Notes (Signed)
   CC: Headaches  HPI:  [name] is a 68 y.o. female with PMHx listed below presenting for headaches. Please see the A&P for the status of the patient's chronic medical problems.  Past Medical History:  Diagnosis Date  . Atrial fibrillation ([place])   . Bunion   . Carotid stenosis   . Carpal tunnel syndrome    mild   . Cholecystitis    s/p cholecystectomy  . Chronic back pain   . Chronic gastritis   . Depression   . Diabetic peripheral neuropathy ([place])   . Digoxin toxicity    08/2011  . Diverticulitis    dx 04/08/14  . Diverticulosis   . Dysphagia    no documented strictures but responded positively to dilation in past.   . Epileptic seizure, tonic ([place])    .No meds since age of 32.  . Family history of adverse reaction to anesthesia    "my mother may have"  . Fatty liver   . Fibromyalgia   . GERD (gastroesophageal reflux disease)   . H/O hiatal hernia    S/P hernia repair  . Hyperlipidemia   . Hypertension   . Hypothyroid   . Internal hemorrhoid   . Mild cognitive impairment   . OSA (obstructive sleep apnea) dx'd 2003   "can't sleep w/that mask; lost some weight; maybe that's helped" (04/26/2014)  . Osteoarthritis   . Pneumonia X 2  . Sarcoidosis   . Skin neoplasm   . TIA (transient ischemic attack)    "don't know when I had it" (04/26/2014)  . Transaminase or LDH elevation   . Type II diabetes mellitus ([place])    Review of Systems:  Performed and all others negative.  Physical Exam: Vitals:   03/17/18 1445  BP: 119/68  Pulse: 66  Temp: 98.2 F (36.8 C)  TempSrc: Oral  SpO2: 100%  Weight: 176 lb 4.8 oz (80 kg)  Height: 5\' 3"  (1.6 m)   General: Well nourished female in no acute distress HENT: Tenderness to palpation of the left temporal region  CV: Regular rhythm, bradycardia, no murmurs, no rubs  Neuro: Alert and oriented x 3. Cranial nerves 2-12 intact bilaterally. Gross strength 5/5 in all extremities, sensation to light touch intact in all  extremities. Patellar, achilles, and brachioradial reflexes 3+ bilaterally   Assessment & Plan:   See Encounters Tab for problem based charting.  Patient discussed with Dr. Dareen Piano

## 2018-03-17 NOTE — Patient Instructions (Signed)
Thank you for allowing [place] to provide your care. We are going to do some blood work today and I am getting you referred for a MRI. This will require your insurance to approve the process prior to getting it. If you have not heard from the radiology department by Tuesday next week please call me so I can call them.   Depending on what your blood work and MRI show will determine how we should proceed.

## 2018-03-17 NOTE — Assessment & Plan Note (Signed)
HPI:  Patient presented for evaluation of progressive bitemporal headaches of 4 months duration. States that they radiate from her neck to the bitemporal region and feel like a band around her head. She has tried tylenol without relief and has not tried any other medication due to her anticoagulation. She does not noticed that they are worse are any particular time of the day. She has a history of headaches but states that these are different from prior. They occur daily.   Pertinent negatives: No visual changes (vision loss, flashing lights, blurry vision), no fevers/chills, N/V, abdominal pain, rash, diarrhea, myalgias, arthralgias, palpitations, SOB, LE.   Pertinent positives: Difficultly sleeping due to the pain, "hot flashes."  On neuro exam the patient is alert and oriented x 3. Cranial nerves 2-12 intact bilaterally. Gross strength 5/5 in all extremities, sensation to light touch intact in all extremities. Patellar, achilles, and brachioradial reflexes 3+ bilaterally   A/P: Patient presenting with chronic progressive tension like headaches. Given that these are new headaches in an elderly female and she is unable to sleep due to the pain she does warrant further investigation into the etiology. We will check a ESR and get an MRI of the brain. I will follow these results up and discuss next steps once the data is available.  

## 2018-03-18 LAB — SEDIMENTATION RATE: Sed Rate: 3 mm/hr (ref 0–40)

## 2018-03-18 NOTE — Progress Notes (Signed)
Internal Medicine Clinic Attending  Case discussed with Dr. Helberg at the time of the visit.  We reviewed the resident's history and exam and pertinent patient test results.  I agree with the assessment, diagnosis, and plan of care documented in the resident's note.    

## 2018-03-19 ENCOUNTER — Ambulatory Visit (HOSPITAL_COMMUNITY)
Admission: RE | Admit: 2018-03-19 | Discharge: 2018-03-19 | Disposition: A | Discharge status: Home / Self Care | Payer: PPO | Source: Ambulatory Visit | Attending: Student in an Organized Health Care Education/Training Program | Admitting: Student in an Organized Health Care Education/Training Program

## 2018-03-19 DIAGNOSIS — G44221 Chronic tension-type headache, intractable: Secondary | ICD-10-CM | POA: Diagnosis not present

## 2018-03-19 DIAGNOSIS — R51 Headache: Secondary | ICD-10-CM | POA: Diagnosis not present

## 2018-03-20 ENCOUNTER — Telehealth: Payer: Self-pay | Admitting: Internal Medicine

## 2018-03-20 DIAGNOSIS — G44221 Chronic tension-type headache, intractable: Secondary | ICD-10-CM

## 2018-03-20 NOTE — Telephone Encounter (Signed)
Called patient to discuss ESR and MRI. Both normal without cause of her headaches. Will refer to headache clinic for treatment options.   Unable to speak with the patient so a HIPPA compliant voicemail was left.

## 2018-03-28 ENCOUNTER — Emergency Department (HOSPITAL_COMMUNITY)
Admission: EM | Admit: 2018-03-28 | Discharge: 2018-03-28 | Disposition: A | Discharge status: Home / Self Care | Payer: PPO | Attending: Emergency Medicine | Admitting: Emergency Medicine

## 2018-03-28 ENCOUNTER — Other Ambulatory Visit: Payer: Self-pay

## 2018-03-28 ENCOUNTER — Emergency Department (HOSPITAL_COMMUNITY): Payer: PPO

## 2018-03-28 DIAGNOSIS — M25522 Pain in left elbow: Secondary | ICD-10-CM | POA: Insufficient documentation

## 2018-03-28 DIAGNOSIS — W010XXA Fall on same level from slipping, tripping and stumbling without subsequent striking against object, initial encounter: Secondary | ICD-10-CM | POA: Insufficient documentation

## 2018-03-28 DIAGNOSIS — Y9301 Activity, walking, marching and hiking: Secondary | ICD-10-CM | POA: Insufficient documentation

## 2018-03-28 DIAGNOSIS — S199XXA Unspecified injury of neck, initial encounter: Secondary | ICD-10-CM

## 2018-03-28 DIAGNOSIS — Z79899 Other long term (current) drug therapy: Secondary | ICD-10-CM | POA: Diagnosis not present

## 2018-03-28 DIAGNOSIS — Z8673 Personal history of transient ischemic attack (TIA), and cerebral infarction without residual deficits: Secondary | ICD-10-CM | POA: Insufficient documentation

## 2018-03-28 DIAGNOSIS — E039 Hypothyroidism, unspecified: Secondary | ICD-10-CM | POA: Diagnosis not present

## 2018-03-28 DIAGNOSIS — Y999 Unspecified external cause status: Secondary | ICD-10-CM | POA: Insufficient documentation

## 2018-03-28 DIAGNOSIS — Y92008 Other place in unspecified non-institutional (private) residence as the place of occurrence of the external cause: Secondary | ICD-10-CM | POA: Diagnosis not present

## 2018-03-28 DIAGNOSIS — T148XXA Other injury of unspecified body region, initial encounter: Secondary | ICD-10-CM

## 2018-03-28 DIAGNOSIS — Z7901 Long term (current) use of anticoagulants: Secondary | ICD-10-CM | POA: Diagnosis not present

## 2018-03-28 DIAGNOSIS — M47812 Spondylosis without myelopathy or radiculopathy, cervical region: Secondary | ICD-10-CM

## 2018-03-28 DIAGNOSIS — W19XXXA Unspecified fall, initial encounter: Secondary | ICD-10-CM

## 2018-03-28 DIAGNOSIS — E119 Type 2 diabetes mellitus without complications: Secondary | ICD-10-CM | POA: Insufficient documentation

## 2018-03-28 DIAGNOSIS — S59902A Unspecified injury of left elbow, initial encounter: Secondary | ICD-10-CM | POA: Diagnosis not present

## 2018-03-28 DIAGNOSIS — S0990XA Unspecified injury of head, initial encounter: Secondary | ICD-10-CM | POA: Diagnosis not present

## 2018-03-28 DIAGNOSIS — I10 Essential (primary) hypertension: Secondary | ICD-10-CM | POA: Diagnosis not present

## 2018-03-28 DIAGNOSIS — Z91040 Latex allergy status: Secondary | ICD-10-CM | POA: Insufficient documentation

## 2018-03-28 DIAGNOSIS — Z85828 Personal history of other malignant neoplasm of skin: Secondary | ICD-10-CM | POA: Diagnosis not present

## 2018-03-28 NOTE — ED Triage Notes (Signed)
Patient stated she slipped and fell on asphalt outside in the rain on Saturday  January 18.  Patient stated she hit her head, denies loss of consciousness.  Patient also states she fell on back and caught herself with left arm and is now experiencing pain.  Patient has been dizzy off and on since fall on Saturday.

## 2018-03-28 NOTE — ED Provider Notes (Signed)
[facility] EMERGENCY DEPARTMENT Provider Note   CSN: [id number] Arrival date & time: 03/28/18  1152     History   Chief Complaint Chief Complaint  Patient presents with  . Fall    HPI [name] is a 68 y.o. female.  HPI   She presents for evaluation of injuries from fall 2 days ago.  She describes a mechanical fall when she walked outside, concrete was wet, which caused her to slip and fall backwards striking her lower back, and her head.  She was able to get up with assistance and drove herself home.  She noticed after that that she developed pain in her head and left elbow after the fall.  This pain is been persistent and makes her concerned because she is on a oral anticoagulant.  She denies nausea, vomiting, blurred vision, paresthesias or weaknesses.  She has tried OTC analgesia without relief.  There are no other known modifying factors.  Past Medical History:  Diagnosis Date  . Atrial fibrillation ([place])   . Bunion   . Carotid stenosis   . Carpal tunnel syndrome    mild   . Cholecystitis    s/p cholecystectomy  . Chronic back pain   . Chronic gastritis   . Depression   . Diabetic peripheral neuropathy ([place])   . Digoxin toxicity    08/2011  . Diverticulitis    dx 04/08/14  . Diverticulosis   . Dysphagia    no documented strictures but responded positively to dilation in past.   . Epileptic seizure, tonic ([place])    .No meds since age of 59.  . Family history of adverse reaction to anesthesia    "my mother may have"  . Fatty liver   . Fibromyalgia   . GERD (gastroesophageal reflux disease)   . H/O hiatal hernia    S/P hernia repair  . Hyperlipidemia   . Hypertension   . Hypothyroid   . Internal hemorrhoid   . Mild cognitive impairment   . OSA (obstructive sleep apnea) dx'd 2003   "can't sleep w/that mask; lost some weight; maybe that's helped" (04/26/2014)  . Osteoarthritis   . Pneumonia X 2  . Sarcoidosis   . Skin neoplasm   . TIA (transient  ischemic attack)    "don't know when I had it" (04/26/2014)  . Transaminase or LDH elevation   . Type II diabetes mellitus [facility])     Patient Active Problem List   Diagnosis Date Noted  . Abnormal CT scan 03/16/2017  . Sinusitis 12/16/2016  . Upper respiratory infection with cough and congestion 11/27/2016  . Diarrhea 04/05/2015  . Left knee pain 12/19/2014  . Vitamin D deficiency 04/28/2014  . BPPV (benign paroxysmal positional vertigo) 04/26/2014  . Clostridium difficile infection 04/16/2014  . Arthritis, midfoot 08/16/2013  . Muscle spasm of back 08/10/2013  . Right foot pain 08/03/2013  . Poor dentition 04/03/2013  . Healthcare maintenance 03/31/2013  . Abnormality of gait 07/25/2012  . Headache 02/25/2012  . Chronic back pain 10/07/2011  . Financial difficulties 10/07/2011  . Hemorrhoids 10/06/2011  . Anemia 10/02/2011  . Diverticulosis 10/02/2011  . Precordial pain 09/01/2011  . Nonspecific (abnormal) findings on radiological and other examination of gastrointestinal tract 08/07/2011  . Ventral hernia s/p laparoscopic lysis of adhesions and hernia repair with mesh 07/29/11 08/01/2011  . Joint pain 05/08/2011  . Preventative health care 04/28/2010  . Depression 05/09/2009  . OBESITY, UNSPECIFIED 11/06/2008  . Carotid stenosis 11/06/2008  .  Rectal bleeding 09/14/2008  . FIBROMYALGIA, SEVERE 09/14/2008  . GERD 09/12/2008  . Osteopenia 06/22/2008  . Hyperlipidemia 06/30/2007  . TIA 06/01/2007  . DIABETIC PERIPHERAL NEUROPATHY 04/05/2006  . Diabetes type 2, controlled ([place]) 03/18/2006  . Hypothyroidism 12/21/2005  . Essential hypertension 12/21/2005  . Atrial fibrillation ([place]) 12/21/2005  . Osteoarthritis 12/21/2005    Past Surgical History:  Procedure Laterality Date  . ABDOMINAL HYSTERECTOMY  1980's   partial  . BILATERAL OOPHORECTOMY  1980's   "after partial hysterectomy"  . BREAST BIOPSY Right    benign  . BREAST EXCISIONAL BIOPSY Right 03/2006   benign    . BREAST SURGERY Right ~ 2010   excision milk duct;  . CATARACT EXTRACTION W/PHACO  10/27/2010   Procedure: CATARACT EXTRACTION PHACO AND INTRAOCULAR LENS PLACEMENT (IOC);  Surgeon: Williams Che;  Location: [facility];  Service: Ophthalmology;  Laterality: Left;  CDE- 1.78  . CATARACT EXTRACTION W/PHACO  07/13/2011   Procedure: CATARACT EXTRACTION PHACO AND INTRAOCULAR LENS PLACEMENT (IOC);  Surgeon: Williams Che, MD;  Location: [facility];  Service: Ophthalmology;  Laterality: Right;  CDE:  1.65  . DILATION AND CURETTAGE OF UTERUS  1970; 1976  . ESOPHAGEAL DILATION  multiple  . FOOT SURGERY Left ~ 2011   "straightened toe & scraped bone below big toe"  . FOOT SURGERY Right ~ 2011   "scraped bone of big toe; shortened middle toet"  . HIATAL HERNIA REPAIR  1990   "had to have scar tissue removed 6 months after repair"  . Inverted nipples  1992  . LAPAROSCOPIC CHOLECYSTECTOMY  1980's  . LIPOSUCTION  1992  . SALPINGOOPHORECTOMY Bilateral    "6 months or so after hysterectomy"  . SKIN CANCER EXCISION  1990's   "front side of right shin"  . TUBAL LIGATION  1976  . VENTRAL HERNIA REPAIR  07/29/2011   Procedure: LAPAROSCOPIC VENTRAL HERNIA;  Surgeon: Adin Hector, MD;  Location: [place];  Service: General;  Laterality: N/A;  multiple incarcerated hernias with mesh     OB History   No obstetric history on file.      Home Medications    Prior to Admission medications   Medication Sig Start Date End Date Taking? Authorizing Provider  atenolol (TENORMIN) 25 MG tablet TAKE 1 AND 1/2 TABLET TWICE A DAY 01/13/18  Yes Helberg, Larkin Ina, MD  Cholecalciferol ([employer] VITAMIN D3) 25 MCG (1000 UT) capsule Take 2 capsules (2,000 Units total) by mouth daily. 01/13/18  Yes Helberg, Larkin Ina, MD  diltiazem (CARTIA XT) 180 MG 24 hr capsule Take 1 capsule (180 mg total) by mouth daily. 01/13/18  Yes Helberg, Larkin Ina, MD  famotidine (PEPCID) 20 MG tablet Take 1 tablet (20 mg total) by mouth daily. 01/13/18  Yes  Helberg, Larkin Ina, MD  FLUoxetine (PROZAC) 20 MG tablet Take 1 tablet (20 mg total) by mouth daily. 01/13/18  Yes Helberg, Larkin Ina, MD  levothyroxine (SYNTHROID, LEVOTHROID) 50 MCG tablet Take 1 tablet (50 mcg total) by mouth every morning. 01/13/18  Yes Helberg, Larkin Ina, MD  losartan (COZAAR) 25 MG tablet Take 0.5 tablets (12.5 mg total) by mouth daily. 01/13/18  Yes Helberg, Larkin Ina, MD  rivaroxaban (XARELTO) 20 MG TABS tablet TAKE 1 TABLET DAILY WITH SUPPER 01/13/18  Yes Helberg, Larkin Ina, MD  rosuvastatin (CRESTOR) 20 MG tablet Take 1 tablet (20 mg total) by mouth daily. 01/13/18  Yes Ina Homes, MD    Family History Family History  Problem Relation Age of Onset  . Crohn's disease Mother   .  Colitis Mother        Crohns  . Anesthesia problems Mother   . Arthritis Mother        rheumatoid; maternal side of family also with RA  . Diabetes Father   . Heart disease Father   . Melanoma Father   . Coronary artery disease Father   . Skin cancer Father   . Diabetes Sister   . Depression Maternal Uncle   . Dementia Maternal Uncle   . Colon cancer Paternal Uncle        ? if stomach or colon   . Hypotension Neg Hx   . Malignant hyperthermia Neg Hx   . Pseudochol deficiency Neg Hx     Social History Social History   Tobacco Use  . Smoking status: Never Smoker  . Smokeless tobacco: Never Used  Substance Use Topics  . Alcohol use: No    Alcohol/week: 0.0 standard drinks  . Drug use: No     Allergies   Gemfibrozil; Latex; Penicillins; Adhesive [tape]; Codeine; Digoxin and related; Metformin and related; Omeprazole; and Ace inhibitors   Review of Systems Review of Systems  All other systems reviewed and are negative.    Physical Exam Updated Vital Signs BP 126/78 (BP Location: Right Arm)   Pulse 64   Temp 98.1 F (36.7 C) (Oral)   Resp 14   Ht 5\' 3"  (1.6 m)   Wt 77.1 kg   LMP 06/03/1978   SpO2 100%   BMI 30.11 kg/m   Physical Exam Vitals signs and nursing note  reviewed.  Constitutional:      General: She is not in acute distress.    Appearance: Normal appearance. She is well-developed and normal weight. She is not ill-appearing or diaphoretic.  HENT:     Head: Normocephalic.     Comments: Tender mid parietal without contusion, abrasion or laceration.    Right Ear: External ear normal.     Left Ear: External ear normal.  Eyes:     Conjunctiva/sclera: Conjunctivae normal.     Pupils: Pupils are equal, round, and reactive to light.  Neck:     Musculoskeletal: Normal range of motion and neck supple.     Trachea: Phonation normal.  Cardiovascular:     Rate and Rhythm: Normal rate and regular rhythm.     Heart sounds: Normal heart sounds.  Pulmonary:     Effort: Pulmonary effort is normal.     Breath sounds: Normal breath sounds.  Chest:     Chest wall: No tenderness (No crepitation or deformity).  Musculoskeletal: Normal range of motion.     Comments: Normal range of motion neck and back.  Mild lower neck tenderness in the midline.  No deformity of the back.  No significant tenderness to palpation of the thoracic or lumbar spines.  Left elbow, tender on olecranon process without localized swelling, crepitation or deformity.  Somewhat limited range of motion left elbow secondary to pain.  Skin:    General: Skin is warm and dry.  Neurological:     Mental Status: She is alert and oriented to person, place, and time.     Cranial Nerves: No cranial nerve deficit.     Sensory: No sensory deficit.     Motor: No abnormal muscle tone.     Coordination: Coordination normal.  Psychiatric:        Behavior: Behavior normal.        Thought Content: Thought content normal.  Judgment: Judgment normal.      ED Treatments / Results  Labs (all labs ordered are listed, but only abnormal results are displayed) Labs Reviewed - No data to display  EKG None  Radiology Dg Elbow Complete Left  Result Date: 03/28/2018 CLINICAL DATA:  Post fall on  Saturday with persistent left elbow pain, worse with movement. EXAM: LEFT ELBOW - COMPLETE 3+ VIEW COMPARISON:  None. FINDINGS: Linear approximately 0.7 cm ossicle adjacent to the olecranon process likely represents sequela of age-indeterminate avulsive injury. Otherwise, no fracture or elbow joint effusion. Joint spaces are preserved. Regional soft tissues appear normal. No radiopaque foreign body. IMPRESSION: 1. Linear ossicle adjacent to the olecranon process likely represents sequela of age-indeterminate avulsive injury. Correlation point tenderness at this location is advised. 2. Otherwise, no explanation for patient's left elbow pain. Electronically Signed   By: Sandi Mariscal M.D.   On: 03/28/2018 13:30   Ct Head Wo Contrast  Result Date: 03/28/2018 CLINICAL DATA:  fall 03/26/2018.  Hit back of head EXAM: CT HEAD WITHOUT CONTRAST CT CERVICAL SPINE WITHOUT CONTRAST TECHNIQUE: Multidetector CT imaging of the head and cervical spine was performed following the standard protocol without intravenous contrast. Multiplanar CT image reconstructions of the cervical spine were also generated. COMPARISON:  MRI head 03/19/2018 FINDINGS: CT HEAD FINDINGS Brain: No evidence of acute infarction, hemorrhage, hydrocephalus, extra-axial collection or mass lesion/mass effect. Vascular: Negative for hyperdense vessel Skull: Negative for fracture Sinuses/Orbits: Paranasal sinuses clear. Bilateral cataract surgery. Other: None CT CERVICAL SPINE FINDINGS Alignment: Mild anterolisthesis C3-4 and C4-5 with associated facet degeneration. Skull base and vertebrae: Negative for fracture Soft tissues and spinal canal: Negative for soft tissue mass in the neck. Atherosclerotic disease in the carotid arteries bilaterally. Disc levels: congenital fusion C7-T1. Moderate facet degeneration bilaterally at C3-4 and C4-5. No significant spinal stenosis. Upper chest: Negative Other: None IMPRESSION: Negative CT head Negative for cervical spine  fracture. Electronically Signed   By: Franchot Gallo M.D.   On: 03/28/2018 13:47   Ct Cervical Spine Wo Contrast  Result Date: 03/28/2018 CLINICAL DATA:  fall 03/26/2018.  Hit back of head EXAM: CT HEAD WITHOUT CONTRAST CT CERVICAL SPINE WITHOUT CONTRAST TECHNIQUE: Multidetector CT imaging of the head and cervical spine was performed following the standard protocol without intravenous contrast. Multiplanar CT image reconstructions of the cervical spine were also generated. COMPARISON:  MRI head 03/19/2018 FINDINGS: CT HEAD FINDINGS Brain: No evidence of acute infarction, hemorrhage, hydrocephalus, extra-axial collection or mass lesion/mass effect. Vascular: Negative for hyperdense vessel Skull: Negative for fracture Sinuses/Orbits: Paranasal sinuses clear. Bilateral cataract surgery. Other: None CT CERVICAL SPINE FINDINGS Alignment: Mild anterolisthesis C3-4 and C4-5 with associated facet degeneration. Skull base and vertebrae: Negative for fracture Soft tissues and spinal canal: Negative for soft tissue mass in the neck. Atherosclerotic disease in the carotid arteries bilaterally. Disc levels: congenital fusion C7-T1. Moderate facet degeneration bilaterally at C3-4 and C4-5. No significant spinal stenosis. Upper chest: Negative Other: None IMPRESSION: Negative CT head Negative for cervical spine fracture. Electronically Signed   By: Franchot Gallo M.D.   On: 03/28/2018 13:47    Procedures Procedures (including critical care time)  Medications Ordered in ED Medications - No data to display   Initial Impression / Assessment and Plan / ED Course  I have reviewed the triage vital signs and the nursing notes.  Pertinent labs & imaging results that were available during my care of the patient were reviewed by me and considered in my medical decision  making (see chart for details).  Clinical Course as of Mar 28 1425  Mon Mar 28, 2018  1413 No intracranial injury, images reviewed by me  CT Head Wo  Contrast [EW]  1415 Moderate degenerative changes facets C3-4 and C3 5, bilaterally.  Images reviewed by me  CT Cervical Spine Wo Contrast [EW]  9163 Possible avulsion fracture, olecranon, seen on lateral imaging, images reviewed by me  DG Elbow Complete Left [EW]  1422 Ace wrap ordered and placed by nursing for left elbow avulsion fracture.   [EW]    Clinical Course User Index [EW] Daleen Bo, MD     Patient Vitals for the past 24 hrs:  BP Temp Temp src Pulse Resp SpO2 Height Weight  03/28/18 1215 - - - - - - 5\' 3"  (1.6 m) 77.1 kg  03/28/18 1213 126/78 98.1 F (36.7 C) Oral 64 14 100 % - -  03/28/18 1210 - - Oral - - - - -    2:22 PM Reevaluation with update and discussion. After initial assessment and treatment, an updated evaluation reveals no change in clinical status.  Findings discussed with the patient and all questions were answered. Daleen Bo   Medical Decision Making: Fall without serious injury.  Fall was mechanical.  Head injury without intracranial injury.  Neck injury with degenerative changes of the cervical spine primarily facet disease.  Left elbow avulsion fracture, small, not articular in location, and likely to heal with symptomatic treatment.  Patient has only minimal limitation of motion at this time.  No indication for further ED treatment or hospitalization, at this time.  CRITICAL CARE-no Performed by: Daleen Bo   Nursing Notes Reviewed/ Care Coordinated Applicable Imaging Reviewed Interpretation of Laboratory Data incorporated into ED treatment  The patient appears reasonably screened and/or stabilized for discharge and I doubt any other medical condition or other [facility] requiring further screening, evaluation, or treatment in the ED at this time prior to discharge.  Plan: Home Medications-use Tylenol for pain in additional 2 usual medications; Home Treatments-Ace wrap or elbow sleeve, for comfort related to elbow avulsion fracture, rest, heat to  affected areas, gradually increase activity; return here if the recommended treatment, does not improve the symptoms; Recommended follow up-PCP follow-up 1 week and as needed     Final Clinical Impressions(s) / ED Diagnoses   Final diagnoses:  Fall, initial encounter  Injury of head, initial encounter  Avulsion fracture of bone  Osteoarthritis of cervical spine, unspecified spinal osteoarthritis complication status  Injury of neck, initial encounter    ED Discharge Orders    None       Daleen Bo, MD 03/28/18 1427

## 2018-03-28 NOTE — ED Notes (Signed)
Pt reports she is on xarelto

## 2018-03-28 NOTE — Discharge Instructions (Addendum)
The x-rays and CAT scans did not show any serious injury.  You have a small avulsion fracture of the olecranon process of the left elbow.  The treatment for this will be avoiding use of the left elbow, wrapping it with an Ace wrap or elbow sleeve, taking Tylenol for pain.  You have some arthritis in your neck which was likely aggravated when you fell and injured it.  To help this pain use Tylenol and try heat on your neck.  There was no sign of intracranial bleeding, to be concerned about at this time.  Return here or see your doctor as needed for problems.

## 2018-03-30 ENCOUNTER — Ambulatory Visit: Payer: PPO | Admitting: Neurology

## 2018-04-07 ENCOUNTER — Ambulatory Visit (INDEPENDENT_AMBULATORY_CARE_PROVIDER_SITE_OTHER): Payer: PPO | Admitting: Internal Medicine

## 2018-04-07 ENCOUNTER — Other Ambulatory Visit: Payer: Self-pay

## 2018-04-07 ENCOUNTER — Encounter: Payer: Self-pay | Admitting: Internal Medicine

## 2018-04-07 VITALS — BP 118/69 | HR 81 | Temp 98.0°F | Ht 63.0 in | Wt 175.9 lb

## 2018-04-07 DIAGNOSIS — R51 Headache: Secondary | ICD-10-CM

## 2018-04-07 DIAGNOSIS — Z9181 History of falling: Secondary | ICD-10-CM

## 2018-04-07 DIAGNOSIS — W19XXXD Unspecified fall, subsequent encounter: Secondary | ICD-10-CM

## 2018-04-07 DIAGNOSIS — G44221 Chronic tension-type headache, intractable: Secondary | ICD-10-CM

## 2018-04-07 DIAGNOSIS — W19XXXA Unspecified fall, initial encounter: Secondary | ICD-10-CM | POA: Insufficient documentation

## 2018-04-07 NOTE — Progress Notes (Signed)
   CC: f/u fall  HPI:  [name] is a 68 y.o. female with PMHx listed below presenting for f.u after a mechanical fall. Please see the A&P for the status of the patient's chronic medical problems.  Past Medical History:  Diagnosis Date  . Abnormal CT scan 03/16/2017  . Atrial fibrillation ([place])   . Bunion   . Carotid stenosis   . Carpal tunnel syndrome    mild   . Cholecystitis    s/p cholecystectomy  . Chronic back pain   . Chronic gastritis   . Depression   . Diabetic peripheral neuropathy ([place])   . Diarrhea 04/05/2015  . Digoxin toxicity    08/2011  . Diverticulitis    dx 04/08/14  . Diverticulosis   . Dysphagia    no documented strictures but responded positively to dilation in past.   . Epileptic seizure, tonic ([place])    .No meds since age of 13.  . Family history of adverse reaction to anesthesia    "my mother may have"  . Fatty liver   . Fibromyalgia   . GERD (gastroesophageal reflux disease)   . H/O hiatal hernia    S/P hernia repair  . Hyperlipidemia   . Hypertension   . Hypothyroid   . Internal hemorrhoid   . Joint pain 05/08/2011  . Left knee pain 12/19/2014  . Mild cognitive impairment   . Muscle spasm of back 08/10/2013  . OSA (obstructive sleep apnea) dx'd 2003   "can't sleep w/that mask; lost some weight; maybe that's helped" (04/26/2014)  . Osteoarthritis   . Pneumonia X 2  . Right foot pain 08/03/2013  . Sarcoidosis   . Skin neoplasm   . TIA (transient ischemic attack)    "don't know when I had it" (04/26/2014)  . Transaminase or LDH elevation   . Type II diabetes mellitus ([place])    Review of Systems:  Performed and all others negative.  Physical Exam: Vitals:   04/07/18 1552  BP: 118/69  Pulse: 81  Temp: 98 F (36.7 C)  TempSrc: Oral  SpO2: 100%  Weight: 175 lb 14.4 oz (79.8 kg)  Height: 5\' 3"  (1.6 m)   General: Well nourished female in no acute distress Pulm: Good air movement with no wheezing or crackles  CV: RRR, no murmurs, no  rubs   Assessment & Plan:   See Encounters Tab for problem based charting.  Patient discussed with Dr. Eppie Gibson

## 2018-04-07 NOTE — Patient Instructions (Signed)
Thank you for allowing me to provide your care. I will see you back in 3 months. Please let me know if the headaches start to get worse.

## 2018-04-07 NOTE — Progress Notes (Signed)
Case discussed with Dr. Helberg at the time of the visit.  We reviewed the resident's history and exam and pertinent patient test results.  I agree with the assessment, diagnosis and plan of care documented in the resident's note. 

## 2018-04-07 NOTE — Assessment & Plan Note (Signed)
HPI: patient previously evaluated for by temporal progressive headaches. MRI negative. ESR negative. Her headaches are improving. She now recalls that she previously had similar headaches 4 to 5 years ago and got injections. She does not believe they were Botox. She is currently scheduled to see the headache clinic in about the next month.  A/P: she will follow-up with headache clinic on March 17.

## 2018-04-07 NOTE — Assessment & Plan Note (Signed)
HPI:  patient presented for follow-up after being evaluated in the emergency department for mechanical fall. She describes that she was at a baby shower and it was raining out. She stepped outside on the slick sidewalk and subsequently fell hitting her head and her left elbow. She presented to the emergency department where she got a CT scan of her head and an x-ray of her elbow. CT had was negative but the film of the elbow showed avulsion fracture that did not need surgical intervention. She was told to take acetaminophen for pain. Physical exam is benign without any new complaints.  A/P: no further workup required for this fall. Patient's pain is improving. Discussed that she continue continue to take acetaminophen daily. Not to exceed 3 g per day.

## 2018-04-14 ENCOUNTER — Other Ambulatory Visit: Payer: Self-pay

## 2018-04-14 NOTE — Patient Outreach (Signed)
[place] [facility]) Care Management  04/14/2018  ARLEIGH DICOLA Mar 21, 1950 [id number]  TELEPHONE SCREENING Referral date: 04/04/18 Referral source: utilization management Referral reason: medication assistance.  Insurance: Medicare Attempt #1  Telephone call to patient regarding utilization management referral. Unable to reach patient. HIPAA compliant voice message left with call back phone number.   PLAN: RNCM will attempt 2nd telephone call to patient within 4 business days. RNCM will send outreach letter.   Quinn Plowman RN,BSN, [place] Telephonic  [phone]

## 2018-04-19 ENCOUNTER — Other Ambulatory Visit: Payer: Self-pay

## 2018-04-19 NOTE — Patient Outreach (Signed)
[place] [facility]) Care Management  [place]  04/19/2018  [name] Sep 03, 1950 [id number]   Reason for call: medication assistance for Xarelto  Unsuccessful telephone call attempt # 1 to patient.   HIPAA compliant voicemail left requesting a return call.  Plan:  I will make another outreach attempt to patient within 3-4 business days.  Joetta Manners, PharmD Clinical Pharmacist [place] [phone]

## 2018-04-19 NOTE — Patient Outreach (Signed)
[place] [facility]) Care Management  04/19/2018  [name] 02/17/1951 [id number]  TELEPHONE SCREENING Referral date: 04/04/18 Referral source: utilization management Referral reason: medication assistance.  Insurance: Medicare  Telephone call to patient regarding Psychologist, sport and exercise. HIPAA verified with patient. Explained reason for call. Patient states she is really in need of assistance with her Xeralto medication. Patient states she receives a 3 month supply which cost her $41.  Patient states she is not in the donut hole at this time but is still having a hard time paying $80 for a 3 month supply. Patient states she has been receiving samples from her primary MD office.  Patient reports sustaining a fall approximately 1 month ago with an Emergency department visit. Patient states she chipped a bone in her left elbow. Patient states she has followed up with her doctor since the injury.  Patient states she has blockage in both carotid arteries and has a follow up next week with a specialist. She reports she also has headaches and has a follow up with a specialist in March 2020 to be seen.   RNCM discussed and offered [facility] care management. Patient verbally agreed to follow up with the pharmacist for medication assistance. Patient declined additional services.   PLAN: RNCM will refer patient to pharmacist.   Quinn Plowman RN,BSN,CCM [facility] Telephonic  [phone]

## 2018-04-25 ENCOUNTER — Ambulatory Visit: Payer: Self-pay

## 2018-04-25 ENCOUNTER — Other Ambulatory Visit: Payer: Self-pay

## 2018-04-25 NOTE — Patient Outreach (Signed)
[facility]) Care Management  [place]   04/25/2018  LEVADA BOWERSOX December 24, 1950 [id number]  Reason for referral: Medication Assistance with Xarelto  Referral source: [facility] RN Current insurance:[employer]  PMHx includes but not limited to:  Hypertension, atrial fibrillation, carotid stenosis, TIA, GERD, hypothyroidism, type 2 diabetes mellitus and hyperlipidemia.  Outreach:  Successful telephone call with [name].  HIPAA identifiers verified.   Subjective:  [name] reports that she has trouble affording her Xarelto when she goes in the coverage gap, which is usually around June/July.  She reports that she sometimes receives samples from [facility]. She states that her diabetes is now diet controlled after she lost 60 lbs.  Objective: Lab Results  Component Value Date   CREATININE 0.81 01/13/2018   CREATININE 0.83 03/10/2017   CREATININE 0.85 08/27/2016    Lab Results  Component Value Date   HGBA1C 6.5 (A) 01/13/2018    Lipid Panel     Component Value Date/Time   CHOL 109 01/13/2018 1431   TRIG 150 (H) 01/13/2018 1431   HDL 52 01/13/2018 1431   CHOLHDL 2.1 01/13/2018 1431   CHOLHDL 2.9 04/19/2014 1603   VLDL 36 04/19/2014 1603   LDLCALC 27 01/13/2018 1431    BP Readings from Last 3 Encounters:  04/07/18 118/69  03/28/18 124/68  03/17/18 119/68    Allergies  Allergen Reactions  . Gemfibrozil Swelling    REACTION: Angioedema  . Latex Other (See Comments)    Blisters where touched or applied  . Penicillins Hives    Will spread in patches all over the body. Did it involve swelling of the face/tongue/throat, SOB, or low BP? No Did it involve sudden or severe rash/hives, skin peeling, or any reaction on the inside of your mouth or nose? No Did you need to seek medical attention at a hospital or doctor's office? Yes When did it last happen? If all above answers are "NO", may proceed with cephalosporin use.   .  Adhesive [Tape] Other (See Comments)    Will blister skin where applied - do not use BAND-AIDS.  Marland Kitchen Codeine Hives    Will spread in patches all over the body.  . Digoxin And Related     Makes BP drop  . Metformin And Related Diarrhea  . Omeprazole Diarrhea  . Ace Inhibitors Rash    Cough and rash     Medications Reviewed Today    Reviewed by Dionne Milo, [facility] (Pharmacist) on 04/25/18 at 0909  Med List Status: <None>  Medication Order Taking? Sig Documenting Provider Last Dose Status Informant  atenolol (TENORMIN) 25 MG tablet [id number] Yes TAKE 1 AND 1/2 TABLET TWICE A DAY Helberg, Justin, MD Taking Active Self  Cholecalciferol (VITAMIN D3) 1.25 MG (50000 UT) CAPS [id number] Yes Take 1 capsule by mouth daily. [provider] Taking Active Self  diltiazem (CARTIA XT) 180 MG 24 hr capsule [id number] Yes Take 1 capsule (180 mg total) by mouth daily. Ina Homes, MD Taking Active Self  famotidine (PEPCID) 20 MG tablet [id number] Yes Take 1 tablet (20 mg total) by mouth daily. Ina Homes, MD Taking Active Self  FLUoxetine (PROZAC) 20 MG tablet [id number] Yes Take 1 tablet (20 mg total) by mouth daily. Ina Homes, MD Taking Active Self  levothyroxine (SYNTHROID, LEVOTHROID) 50 MCG tablet [id number] Yes Take 1 tablet (50 mcg total) by mouth every morning. Ina Homes, MD Taking Active Self  losartan (COZAAR) 25 MG tablet [id number] Yes Take  0.5 tablets (12.5 mg total) by mouth daily. Ina Homes, MD Taking Active Self  rivaroxaban (XARELTO) 20 MG TABS tablet [id number] Yes TAKE 1 TABLET DAILY WITH SUPPER Ina Homes, MD Taking Active Self  rosuvastatin (CRESTOR) 20 MG tablet [id number] Yes Take 1 tablet (20 mg total) by mouth daily. Ina Homes, MD Taking Active Self          Assessment:  Drugs sorted by system:  Neurologic/Psychologic: fluoxetine   Cardiovascular: atenolol, diltiazem, losartan, rivaroxaban,  rosuvastatin  Gastrointestinal:famotidine  Endocrine: levothryoxine  Vitamins/Minerals/Supplements: cholecalciferol   Medication Assistance Findings:  Medication assistance needs identified.   Extra Help:   _0  Already receiving Full Extra Help  _1  Already receiving Partial Extra Help  _2  Eligible based on reported income and assets  _3  Not Eligible based on reported income and assets  Patient Assistance Programs: 1) Xarelto made by [employer] and [place] requirement met: _4  Yes _5  No _6  Unknown o Out-of-pocket prescription expenditure met:    _7  Yes _8  No  _9  Unknown  <[id number]>_6<\/[id number]>_29  Not applicable Patient has not met application requirements to apply for this patient assistance program at this time.  She will need to spend 4% of income ~($800) out-of-pocket on prescriptions to be eligible. She has my phone number to call me when she is closer to having met the out of pocket expenditure requirement to apply.      Plan: Will close [facility] pharmacy case as no further medication needs identified at this time.  Am happy to assist in the future as needed.    Joetta Manners, PharmD [place] [phone]

## 2018-04-29 ENCOUNTER — Ambulatory Visit (HOSPITAL_COMMUNITY)
Admission: RE | Admit: 2018-04-29 | Discharge: 2018-04-29 | Disposition: A | Discharge status: Home / Self Care | Payer: PPO | Source: Ambulatory Visit | Attending: Cardiovascular Disease | Admitting: Cardiovascular Disease

## 2018-04-29 DIAGNOSIS — I6523 Occlusion and stenosis of bilateral carotid arteries: Secondary | ICD-10-CM

## 2018-05-02 ENCOUNTER — Encounter

## 2018-05-02 ENCOUNTER — Encounter: Payer: Self-pay | Admitting: Neurology

## 2018-05-02 ENCOUNTER — Ambulatory Visit: Payer: PPO | Admitting: Neurology

## 2018-05-02 VITALS — BP 121/65 | HR 60 | Ht 63.0 in | Wt 175.0 lb

## 2018-05-02 DIAGNOSIS — G43109 Migraine with aura, not intractable, without status migrainosus: Secondary | ICD-10-CM | POA: Diagnosis not present

## 2018-05-02 DIAGNOSIS — R51 Headache: Secondary | ICD-10-CM | POA: Diagnosis not present

## 2018-05-02 DIAGNOSIS — G43809 Other migraine, not intractable, without status migrainosus: Secondary | ICD-10-CM

## 2018-05-02 DIAGNOSIS — R519 Headache, unspecified: Secondary | ICD-10-CM

## 2018-05-02 DIAGNOSIS — R42 Dizziness and giddiness: Secondary | ICD-10-CM | POA: Diagnosis not present

## 2018-05-02 MED ORDER — ONDANSETRON 4 MG PO TBDP: 4.0000 mg | ORAL_TABLET | Freq: Three times a day (TID) | ORAL | 3 refills | Status: DC | PRN

## 2018-05-02 NOTE — Progress Notes (Signed)
[facility]    Provider:  Dr Jaynee Eagles Referring Provider: Ina Homes, MD Primary Care Provider:  Ina Homes, MD  CC:  Headaches  HPI:  [name] is a 68 y.o. female here as requested by provider Ina Homes, MD for headaches.  Past medical history diabetes, TIA, OSA, mild cognitive impairment, migraine, hypothyroidism, hypertension, hyperlipidemia, fibromyalgia, A. fib, carotid stenosis.  She is here with her fianc who also provides much information.  She has had headaches for about a year.  Worsening over the last year. She has a history of vertigo and was admitted to [facility] years ago. She wakes up and then rolls over and the room spins. She fell recently she slipped on wet concrete and hit her head on the back and since then the headache were worse but now improving. Most of her headaches on the left side. Throbbing, dizzy, on the left side but mostly on the left. Dizziness and vertigo even without a headache. No nausea. She wakes up with headaches quite often. She snores, heavy breathing. He punches her to roll over. She wakes often to go to the bathroom. She wakes up dizzy. Some days she feels rested, some days not.  This month she has only had one headache, however and doing well. Vertigo is still daily the last 2-3 weeks, prior to the head injury the vertigo was not that often. Really the dizzines is what has her worried and worrsening since hitting her head. No rinfing in her ears. No ear fullness. Never has an problems with hearing. Worse with movements. Standing still makes vertigo go away.  She also has been to headache clinics in the past. No other focal neurologic deficits, associated symptoms, inciting events or modifiable factors.  Reviewed notes, labs and imaging from outside physicians, which showed:  MRI of the brain 03/19/2018 and CT head 03/28/18 normal for age personally reviewed images. Also reviewed images with patient.  Review of  Systems: Patient complains of symptoms per HPI as well as the following symptoms: headache, dizziness. Pertinent negatives and positives per HPI. All others negative.   Social History   Socioeconomic History  . Marital status: Significant Other    Spouse name: Not on file  . Number of children: 2  . Years of education: 12th   . Highest education level: Not on file  Occupational History  . Occupation: Medical laboratory scientific officer: DISABLED  Social Needs  . Financial resource strain: Not on file  . Food insecurity:    Worry: Not on file    Inability: Not on file  . Transportation needs:    Medical: Not on file    Non-medical: Not on file  Tobacco Use  . Smoking status: Never Smoker  . Smokeless tobacco: Never Used  Substance and Sexual Activity  . Alcohol use: Never    Alcohol/week: 0.0 standard drinks    Frequency: Never  . Drug use: Never  . Sexual activity: Never    Birth control/protection: Surgical  Lifestyle  . Physical activity:    Days per week: Not on file    Minutes per session: Not on file  . Stress: Not on file  Relationships  . Social connections:    Talks on phone: Not on file    Gets together: Not on file    Attends religious service: Not on file    Active member of club or organization: Not on file    Attends meetings of clubs or organizations: Not on  file    Relationship status: Not on file  . Intimate partner violence:    Fear of current or ex partner: Not on file    Emotionally abused: Not on file    Physically abused: Not on file    Forced sexual activity: Not on file  Other Topics Concern  . Not on file  Social History Narrative   Divorced, on disability, has 2 kids (son in [place], daughter in [place]),     Lives with a friend Patsy until she gets married    Left handed   Caffeine: none     Family History  Problem Relation Age of Onset  . Crohn's disease Mother   . Colitis Mother        Crohns  . Anesthesia problems Mother   . Arthritis Mother         rheumatoid; maternal side of family also with RA  . Pneumonia Mother   . Diabetes Father   . Heart disease Father   . Melanoma Father   . Coronary artery disease Father   . Skin cancer Father   . Stroke Father   . Diabetes Sister   . Migraines Sister   . Depression Maternal Uncle   . Dementia Maternal Uncle   . Colon cancer Paternal Uncle        ? if stomach or colon   . Hypotension Neg Hx   . Malignant hyperthermia Neg Hx   . Pseudochol deficiency Neg Hx     Past Medical History:  Diagnosis Date  . Abnormal CT scan 03/16/2017  . Atrial fibrillation ([place])   . Bunion   . Carotid stenosis   . Carpal tunnel syndrome    mild   . Cervical arthritis   . Cholecystitis    s/p cholecystectomy  . Chronic back pain   . Chronic gastritis   . Depression   . Diabetic peripheral neuropathy ([place])   . Diarrhea 04/05/2015  . Digoxin toxicity    08/2011  . Diverticulitis    dx 04/08/14  . Diverticulosis   . Dysphagia    no documented strictures but responded positively to dilation in past.   . Epileptic seizure, tonic ([place])    .No meds since age of 68.  . Family history of adverse reaction to anesthesia    "my mother may have"  . Fatty liver   . Fibromyalgia   . GERD (gastroesophageal reflux disease)   . H/O hiatal hernia    S/P hernia repair  . Hyperlipidemia   . Hypertension   . Hypothyroid   . Internal hemorrhoid   . Joint pain 05/08/2011  . Left knee pain 12/19/2014  . Migraine   . Mild cognitive impairment   . Muscle spasm of back 08/10/2013  . OSA (obstructive sleep apnea) dx'd 2003   "can't sleep w/that mask; lost some weight; maybe that's helped" (04/26/2014)  . Osteoarthritis   . Pneumonia X 2  . Right foot pain 08/03/2013  . Sarcoidosis   . Skin neoplasm   . TIA (transient ischemic attack)    "don't know when I had it" (04/26/2014)  . Transaminase or LDH elevation   . Type II diabetes mellitus [facility])     Patient Active Problem List   Diagnosis Date Noted  .  Vertigo 05/03/2018  . Fall 04/07/2018  . Sinusitis 12/16/2016  . Upper respiratory infection with cough and congestion 11/27/2016  . Vitamin D deficiency 04/28/2014  . BPPV (benign paroxysmal positional vertigo) 04/26/2014  .  Clostridium difficile infection 04/16/2014  . Arthritis, midfoot 08/16/2013  . Poor dentition 04/03/2013  . Healthcare maintenance 03/31/2013  . Abnormality of gait 07/25/2012  . Headache 02/25/2012  . Chronic back pain 10/07/2011  . Financial difficulties 10/07/2011  . Hemorrhoids 10/06/2011  . Anemia 10/02/2011  . Diverticulosis 10/02/2011  . Precordial pain 09/01/2011  . Nonspecific (abnormal) findings on radiological and other examination of gastrointestinal tract 08/07/2011  . Ventral hernia s/p laparoscopic lysis of adhesions and hernia repair with mesh 07/29/11 08/01/2011  . Preventative health care 04/28/2010  . Depression 05/09/2009  . OBESITY, UNSPECIFIED 11/06/2008  . Carotid stenosis 11/06/2008  . Rectal bleeding 09/14/2008  . FIBROMYALGIA, SEVERE 09/14/2008  . GERD 09/12/2008  . Osteopenia 06/22/2008  . Hyperlipidemia 06/30/2007  . TIA 06/01/2007  . DIABETIC PERIPHERAL NEUROPATHY 04/05/2006  . Diabetes type 2, controlled ([place]) 03/18/2006  . Hypothyroidism 12/21/2005  . Essential hypertension 12/21/2005  . Atrial fibrillation ([place]) 12/21/2005  . Osteoarthritis 12/21/2005    Past Surgical History:  Procedure Laterality Date  . ABDOMINAL HYSTERECTOMY  1980's   partial  . BILATERAL OOPHORECTOMY  1980's   "after partial hysterectomy"  . BREAST BIOPSY Right    benign  . BREAST EXCISIONAL BIOPSY Right 03/2006   benign  . BREAST SURGERY Right ~ 2010   excision milk duct;  . BUNIONECTOMY Bilateral   . CATARACT EXTRACTION W/PHACO  10/27/2010   Procedure: CATARACT EXTRACTION PHACO AND INTRAOCULAR LENS PLACEMENT (IOC);  Surgeon: Williams Che;  Location: [facility];  Service: Ophthalmology;  Laterality: Left;  CDE- 1.78  . CATARACT EXTRACTION  W/PHACO  07/13/2011   Procedure: CATARACT EXTRACTION PHACO AND INTRAOCULAR LENS PLACEMENT (IOC);  Surgeon: Williams Che, MD;  Location: [facility];  Service: Ophthalmology;  Laterality: Right;  CDE:  1.65  . DILATION AND CURETTAGE OF UTERUS  1970; 1976  . ESOPHAGEAL DILATION  multiple  . FOOT SURGERY Left ~ 2011   "straightened toe & scraped bone below big toe"  . FOOT SURGERY Right ~ 2011   "scraped bone of big toe; shortened middle toet"  . HIATAL HERNIA REPAIR  1990   "had to have scar tissue removed 6 months after repair"  . Inverted nipples  1992  . LAPAROSCOPIC CHOLECYSTECTOMY  1980's  . LIPOSUCTION  1992  . SALPINGOOPHORECTOMY Bilateral    "6 months or so after hysterectomy"  . SKIN CANCER EXCISION  1990's   "front side of right shin"  . TUBAL LIGATION  1976  . VENTRAL HERNIA REPAIR  07/29/2011   Procedure: LAPAROSCOPIC VENTRAL HERNIA;  Surgeon: Adin Hector, MD;  Location: [facility];  Service: General;  Laterality: N/A;  multiple incarcerated hernias with mesh    Current Outpatient Medications  Medication Sig Dispense Refill  . atenolol (TENORMIN) 25 MG tablet TAKE 1 AND 1/2 TABLET TWICE A DAY 270 tablet 1  . Cholecalciferol (VITAMIN D3 PO) Take 5,000 Int'l Units by mouth daily.    Marland Kitchen diltiazem (CARTIA XT) 180 MG 24 hr capsule Take 1 capsule (180 mg total) by mouth daily. 90 capsule 1  . famotidine (PEPCID) 20 MG tablet Take 1 tablet (20 mg total) by mouth daily. 90 tablet 1  . FLUoxetine (PROZAC) 20 MG tablet Take 1 tablet (20 mg total) by mouth daily. 90 tablet 1  . levothyroxine (SYNTHROID, LEVOTHROID) 50 MCG tablet Take 1 tablet (50 mcg total) by mouth every morning. 90 tablet 1  . losartan (COZAAR) 25 MG tablet Take 0.5 tablets (12.5 mg total) by  mouth daily. 45 tablet 1  . rivaroxaban (XARELTO) 20 MG TABS tablet TAKE 1 TABLET DAILY WITH SUPPER 90 tablet 1  . rosuvastatin (CRESTOR) 20 MG tablet Take 1 tablet (20 mg total) by mouth daily. 90 tablet 1  . ondansetron  (ZOFRAN-ODT) 4 MG disintegrating tablet Take 1 tablet (4 mg total) by mouth every 8 (eight) hours as needed for nausea. 30 tablet 3   No current facility-administered medications for this visit.     Allergies as of 05/02/2018 - Review Complete 05/02/2018  Allergen Reaction Noted  . Gemfibrozil Swelling   . Latex Other (See Comments)   . Penicillins Hives 03/11/2010  . Adhesive [tape] Other (See Comments) 05/26/2011  . Codeine Hives   . Digoxin and related  10/05/2011  . Metformin and related Diarrhea 05/07/2014  . Omeprazole Diarrhea 05/07/2014  . Ace inhibitors Rash 10/03/2012    Vitals: BP 121/65 (BP Location: Right Arm, Patient Position: Sitting)   Pulse 60   Ht 5\' 3"  (1.6 m)   Wt 175 lb (79.4 kg)   LMP 06/03/1978   BMI 31.00 kg/m  Last Weight:  Wt Readings from Last 1 Encounters:  05/02/18 175 lb (79.4 kg)   Last Height:   Ht Readings from Last 1 Encounters:  05/02/18 5\' 3"  (1.6 m)     Physical exam: Exam: Gen: NAD, conversant                     CV: RRR, no MRG. No Carotid Bruits. No peripheral edema, warm, nontender Eyes: Conjunctivae clear without exudates or hemorrhage  Neuro: Detailed Neurologic Exam  Speech:    Speech is normal; fluent and spontaneous with normal comprehension.  Cognition:    The patient is oriented to person, place, and time;     recent and remote memory intact;     language fluent;     normal attention, concentration,     fund of knowledge Cranial Nerves:    The pupils are equal, round, and reactive to light.  Attempted funduscopy could not visualize due to small pupils. Visual fields are full to finger confrontation. Extraocular movements are intact. Trigeminal sensation is intact and the muscles of mastication are normal. The face is symmetric. The palate elevates in the midline. Hearing intact. Voice is normal. Shoulder shrug is normal. The tongue has normal motion without fasciculations.   Coordination:    Normal finger to nose  and heel to shin.   Gait:    Heel-toe and tandem gait are normal.   Motor Observation:    No asymmetry, no atrophy, and no involuntary movements noted. Tone:    Normal muscle tone.    Posture:    Posture is normal. normal erect    Strength:    Strength is V/V in the upper and lower limbs.      Sensation: intact to LT     Reflex Exam:  DTR's:    Deep tendon reflexes in the upper and lower extremities are symmetrical bilaterally.   Toes:    The toes are equivocal bilaterally.   Clonus:    Clonus is absent.    Assessment/Plan: This is a 68 year old female with a past history of migraines and vertigo who is here with worsening vertigo after sustaining a fall.  Likely postconcussive vertigo.  CT of the head and MRI of the brain were unremarkable.  Obstructive sleep apnea: AHI 34 in 2004 05/02/2018 dated report.  Discussed sleep apnea with patient that this can cause  headaches and dizziness and serious sequelae such as stroke.  She does snore, wake frequently but denies any daytime somnolence or fatigue.  She is also lost approximately 50 to 60 pounds since 2004.  She declines sleep evaluation at this time.  Vertigo: We will send her to [facility] for vestibular therapy  Ondansetron up to 3 a day for dizziness and vertigo  We had a discussion about vestibular Migraines, could also be a component of vestibular migraines.  We discussed and I provided her with literature to read.  Gave information.  If dizziness continues despite above may consider treating for vestibular migraines.   Orders Placed This Encounter  Procedures  . Sedimentation rate  . C-reactive protein  . Ambulatory referral to Physical Therapy   Meds ordered this encounter  Medications  . ondansetron (ZOFRAN-ODT) 4 MG disintegrating tablet    Sig: Take 1 tablet (4 mg total) by mouth every 8 (eight) hours as needed for nausea.    Dispense:  30 tablet    Refill:  3   Discussed: To prevent or relieve headaches,  try the following: Cool Compress. Lie down and place a cool compress on your head.  Avoid headache triggers. If certain foods or odors seem to have triggered your migraines in the past, avoid them. A headache diary might help you identify triggers.  Include physical activity in your daily routine. Try a daily walk or other moderate aerobic exercise.  Manage stress. Find healthy ways to cope with the stressors, such as delegating tasks on your to-do list.  Practice relaxation techniques. Try deep breathing, yoga, massage and visualization.  Eat regularly. Eating regularly scheduled meals and maintaining a healthy diet might help prevent headaches. Also, drink plenty of fluids.  Follow a regular sleep schedule. Sleep deprivation might contribute to headaches Consider biofeedback. With this mind-body technique, you learn to control certain bodily functions - such as muscle tension, heart rate and blood pressure - to prevent headaches or reduce headache pain.    Proceed to emergency room if you experience new or worsening symptoms or symptoms do not resolve, if you have new neurologic symptoms or if headache is severe, or for any concerning symptom.   Provided education and documentation from American headache Society toolbox including articles on: chronic migraine medication overuse headache, chronic migraines, prevention of migraines, behavioral and other nonpharmacologic treatments for headache.  Cc: Ina Homes, MD,    Sarina Ill, MD  [facility] [address] [place], [place] [address]  Phone [phone] Fax [phone]

## 2018-05-02 NOTE — Patient Instructions (Addendum)
[name] - vestibular therapy Ondansetron up to 3 a day for dizziness and vertigo Vestibular Migraines? Gave information.   Post-Concussion Syndrome  A concussion is a brain injury from a direct hit (blow) to your head or body. This blow causes your brain to shake quickly back and forth inside your skull. This can damage brain cells and cause chemical changes in your brain. Concussions are usually not life-threatening but can cause several serious symptoms. Post-concussion syndrome is when symptoms that occur after a concussion last longer than normal. These symptoms can last from weeks to months. What are the causes? The cause of this condition is not known. It can happen whether your head injury was mild or severe. What increases the risk? You are more likely to develop this condition if:  You are female.  You are a child, teen, or young adult.  You had a past head injury.  You have a history of headaches.  You have depression or anxiety. What are the signs or symptoms? Physical symptoms  Headaches.  Tiredness.  Dizziness.  Weakness.  Blurry vision.  Sensitivity to light.  Hearing difficulties. Mental and emotional symptoms  Memory difficulties.  Difficulty with concentration.  Difficulty sleeping or staying asleep.  Feeling irritable.  Anxiety or depression.  Difficulty learning new things. How is this diagnosed? This condition may be diagnosed based on:  Your symptoms.  A description of your injury.  Your medical history. Your health care provider may order other tests such as:  Brain function tests (neurological testing).  CT scan. How is this treated? Treatment for this condition may depend on your symptoms. Symptoms usually go away on their own over time. Treatments may include:  Medicines for headaches.  Resting your brain and body for a few days after your injury.  Rehabilitation therapy, such as: ? Physical or occupational therapy.  This may include exercises to help with balance and dizziness. ? Mental health counseling. ? Speech therapy. ? Vision therapy. A brain and eye specialist can recommend treatments for vision problems. Follow these instructions at home: Medicines  Take over-the-counter and prescription medicines only as told by your health care provider.  Avoid opioid prescription pain medicines when recovering from a concussion. Activity  Limit your mental activities for the first few days after your injury, such as: ? Homework or job-related work. ? Complex thinking. ? Watching TV, and using a computer or phone. ? Playing memory games and puzzles. ? Gradually return to your normal activity level. If a certain activity brings on your symptoms, stop or slow down until you can do the activity without it triggering your symptoms.  Limit physical activity, such as exercise or sports, for the first few days after a concussion. Gradually return to normal activity as told by your health care provider. ? If a certain activity brings on your symptoms, stop or slow down until you can do the activity without it triggering your symptoms.  Rest. Rest helps your brain heal. Make sure you: ? Get plenty of sleep at night. Most adults should get at least 7-9 hours of sleep each night. ? Rest during the day. Take naps or rest breaks when you feel tired.  Do not do high-risk activities that could cause a second concussion, such as riding a bike or playing sports. Having another concussion before the first one has healed can be dangerous. General instructions  Do not drink alcohol until your health care provider says you can.  Keep track of the frequency  and the severity of your symptoms. Give this information to your health care provider.  Keep all follow-up visits as directed by your health care provider. This is important. Contact a health care provider if:  Your symptoms do not improve.  You have another  injury. Get help right away if you:  Have a severe or worsening headache.  Are confused.  Have trouble staying awake.  Pass out.  Vomit.  Have weakness or numbness in any part of your body.  Have a seizure.  Have trouble speaking. Summary  Post-concussion syndrome is when symptoms that occur after a concussion last longer than normal.  Symptoms usually go away on their own over time. Depending on your symptoms, you may need treatment, such as medicines or rehabilitation therapy.  Rest your brain and body for a few days after your injury. Gradually return to activities, as told by your health care provider.  Get plenty of sleep, and avoid alcohol and opioid pain medicines while recovering from a concussion. This information is not intended to replace advice given to you by your health care provider. Make sure you discuss any questions you have with your health care provider. Document Released: 08/15/2001 Document Revised: 03/30/2017 Document Reviewed: 03/30/2017 Elsevier Interactive Patient Education  2019 Elsevier Inc.  Ondansetron oral dissolving tablet What is this medicine? ONDANSETRON (on DAN se tron) is used to treat nausea and vomiting caused by chemotherapy. It is also used to prevent or treat nausea and vomiting after surgery. This medicine may be used for other purposes; ask your health care provider or pharmacist if you have questions. COMMON BRAND NAME(S): Zofran ODT What should I tell my health care provider before I take this medicine? They need to know if you have any of these conditions: -heart disease -history of irregular heartbeat -liver disease -low levels of magnesium or potassium in the blood -an unusual or allergic reaction to ondansetron, granisetron, other medicines, foods, dyes, or preservatives -pregnant or trying to get pregnant -breast-feeding How should I use this medicine? These tablets are made to dissolve in the mouth. Do not try to push  the tablet through the foil backing. With dry hands, peel away the foil backing and gently remove the tablet. Place the tablet in the mouth and allow it to dissolve, then swallow. While you may take these tablets with water, it is not necessary to do so. Talk to your pediatrician regarding the use of this medicine in children. Special care may be needed. Overdosage: If you think you have taken too much of this medicine contact a poison control center or emergency room at once. NOTE: This medicine is only for you. Do not share this medicine with others. What if I miss a dose? If you miss a dose, take it as soon as you can. If it is almost time for your next dose, take only that dose. Do not take double or extra doses. What may interact with this medicine? Do not take this medicine with any of the following medications: -apomorphine -certain medicines for fungal infections like fluconazole, itraconazole, ketoconazole, posaconazole, voriconazole -cisapride -dofetilide -dronedarone -pimozide -thioridazine -ziprasidone This medicine may also interact with the following medications: -carbamazepine -certain medicines for depression, anxiety, or psychotic disturbances -fentanyl -linezolid -MAOIs like Carbex, Eldepryl, Marplan, Nardil, and Parnate -methylene blue (injected into a vein) -other medicines that prolong the QT interval (cause an abnormal heart rhythm) -phenytoin -rifampicin -tramadol This list may not describe all possible interactions. Give your health care provider a list  of all the medicines, herbs, non-prescription drugs, or dietary supplements you use. Also tell them if you smoke, drink alcohol, or use illegal drugs. Some items may interact with your medicine. What should I watch for while using this medicine? Check with your doctor or health care professional as soon as you can if you have any sign of an allergic reaction. What side effects may I notice from receiving this  medicine? Side effects that you should report to your doctor or health care professional as soon as possible: -allergic reactions like skin rash, itching or hives, swelling of the face, lips, or tongue -breathing problems -confusion -dizziness -fast or irregular heartbeat -feeling faint or lightheaded, falls -fever and chills -loss of balance or coordination -seizures -sweating -swelling of the hands and feet -tightness in the chest -tremors -unusually weak or tired Side effects that usually do not require medical attention (report to your doctor or health care professional if they continue or are bothersome): -constipation or diarrhea -headache This list may not describe all possible side effects. Call your doctor for medical advice about side effects. You may report side effects to FDA at 1-800-FDA-1088. Where should I keep my medicine? Keep out of the reach of children. Store between 2 and 30 degrees C (36 and 86 degrees F). Throw away any unused medicine after the expiration date. NOTE: This sheet is a summary. It may not cover all possible information. If you have questions about this medicine, talk to your doctor, pharmacist, or health care provider.  2019 Elsevier/Gold Standard (2012-11-30 16:21:52)

## 2018-05-03 ENCOUNTER — Telehealth: Payer: Self-pay | Admitting: Neurology

## 2018-05-03 DIAGNOSIS — R42 Dizziness and giddiness: Secondary | ICD-10-CM | POA: Insufficient documentation

## 2018-05-03 NOTE — Telephone Encounter (Signed)
[name], Can you call patient and let her know I have sent her referral to [facility] for vestibular therapy and give her the number to call there please? Ive had several patients tell me they never hear back from PT so ask her to call an dinitiate appointment. Thanks

## 2018-05-04 NOTE — Telephone Encounter (Signed)
[address], [place], [place] [address] [phone]

## 2018-05-04 NOTE — Telephone Encounter (Signed)
I called the pt on mobile # and LVM asking for call back. When she calls back, please tell her that Dr. Jaynee Eagles has referred her to [facility] outpatient rehab for vestibular therapy and she needs to call them to setup her first appointment. Please give her the address & phone number listed below.

## 2018-05-05 NOTE — Telephone Encounter (Signed)
Spoke with pt on home number and discussed new referral to [facility] outpatient rehab for vestibular therapy. I gave her the contact number. Her questions were answered. She had some questions about the appts and cost that I deferred to the rehab center. Pt verbalized appreciation and will call.

## 2018-05-12 ENCOUNTER — Ambulatory Visit (HOSPITAL_COMMUNITY): Payer: PPO | Attending: Neurology | Admitting: Physical Therapy

## 2018-05-12 ENCOUNTER — Other Ambulatory Visit: Payer: Self-pay

## 2018-05-12 ENCOUNTER — Telehealth (HOSPITAL_COMMUNITY): Payer: Self-pay | Admitting: Internal Medicine

## 2018-05-12 ENCOUNTER — Encounter (HOSPITAL_COMMUNITY): Payer: Self-pay | Admitting: Physical Therapy

## 2018-05-12 DIAGNOSIS — R293 Abnormal posture: Secondary | ICD-10-CM | POA: Diagnosis not present

## 2018-05-12 DIAGNOSIS — H8111 Benign paroxysmal vertigo, right ear: Secondary | ICD-10-CM | POA: Insufficient documentation

## 2018-05-12 NOTE — Therapy (Signed)
[facility] [place], [place], [address] Phone: [phone]   Fax:  [phone]  Physical Therapy Evaluation  Patient Details  Name: [name] MRN: [id number] Date of Birth: Nov 03, 1950 Referring Provider (PT): Sarina Ill   Encounter Date: 05/12/2018  PT End of Session - 05/12/18 1444    Visit Number  1    Number of Visits  8    Date for PT Re-Evaluation  06/11/18    Authorization Type  healthadvantage    PT Start Time  1345    PT Stop Time  1435    PT Time Calculation (min)  50 min    Activity Tolerance  Patient tolerated treatment well    Behavior During Therapy  [facility] for tasks assessed/performed       Past Medical History:  Diagnosis Date  . Abnormal CT scan 03/16/2017  . Atrial fibrillation ([place])   . Bunion   . Carotid stenosis   . Carpal tunnel syndrome    mild   . Cervical arthritis   . Cholecystitis    s/p cholecystectomy  . Chronic back pain   . Chronic gastritis   . Depression   . Diabetic peripheral neuropathy ([place])   . Diarrhea 04/05/2015  . Digoxin toxicity    08/2011  . Diverticulitis    dx 04/08/14  . Diverticulosis   . Dysphagia    no documented strictures but responded positively to dilation in past.   . Epileptic seizure, tonic ([place])    .No meds since age of 39.  . Family history of adverse reaction to anesthesia    "my mother may have"  . Fatty liver   . Fibromyalgia   . GERD (gastroesophageal reflux disease)   . H/O hiatal hernia    S/P hernia repair  . Hyperlipidemia   . Hypertension   . Hypothyroid   . Internal hemorrhoid   . Joint pain 05/08/2011  . Left knee pain 12/19/2014  . Migraine   . Mild cognitive impairment   . Muscle spasm of back 08/10/2013  . OSA (obstructive sleep apnea) dx'd 2003   "can't sleep w/that mask; lost some weight; maybe that's helped" (04/26/2014)  . Osteoarthritis   . Pneumonia X 2  . Right foot pain 08/03/2013  . Sarcoidosis   . Skin neoplasm   .  TIA (transient ischemic attack)    "don't know when I had it" (04/26/2014)  . Transaminase or LDH elevation   . Type II diabetes mellitus ([place])     Past Surgical History:  Procedure Laterality Date  . ABDOMINAL HYSTERECTOMY  1980's   partial  . BILATERAL OOPHORECTOMY  1980's   "after partial hysterectomy"  . BREAST BIOPSY Right    benign  . BREAST EXCISIONAL BIOPSY Right 03/2006   benign  . BREAST SURGERY Right ~ 2010   excision milk duct;  . BUNIONECTOMY Bilateral   . CATARACT EXTRACTION W/PHACO  10/27/2010   Procedure: CATARACT EXTRACTION PHACO AND INTRAOCULAR LENS PLACEMENT (IOC);  Surgeon: Williams Che;  Location: [facility];  Service: Ophthalmology;  Laterality: Left;  CDE- 1.78  . CATARACT EXTRACTION W/PHACO  07/13/2011   Procedure: CATARACT EXTRACTION PHACO AND INTRAOCULAR LENS PLACEMENT (IOC);  Surgeon: Williams Che, MD;  Location: [facility];  Service: Ophthalmology;  Laterality: Right;  CDE:  1.65  . DILATION AND CURETTAGE OF UTERUS  1970; 1976  . ESOPHAGEAL DILATION  multiple  . FOOT SURGERY Left ~ 2011   "straightened  toe & scraped bone below big toe"  . FOOT SURGERY Right ~ 2011   "scraped bone of big toe; shortened middle toet"  . HIATAL HERNIA REPAIR  1990   "had to have scar tissue removed 6 months after repair"  . Inverted nipples  1992  . LAPAROSCOPIC CHOLECYSTECTOMY  1980's  . LIPOSUCTION  1992  . SALPINGOOPHORECTOMY Bilateral    "6 months or so after hysterectomy"  . SKIN CANCER EXCISION  1990's   "front side of right shin"  . TUBAL LIGATION  1976  . VENTRAL HERNIA REPAIR  07/29/2011   Procedure: LAPAROSCOPIC VENTRAL HERNIA;  Surgeon: Adin Hector, MD;  Location: [facility];  Service: General;  Laterality: N/A;  multiple incarcerated hernias with mesh    There were no vitals filed for this visit.   Subjective Assessment - 05/12/18 1330    Subjective  [name] states that she has had a history of headaches and dizziness however she fell several months ago  and ever since the fall she has been experiencing increased dizziness, , which concerns her therefore she went to her MD who referred the to therapy.  She states that every night when she lies down she becomes dizzy as well as when she turns her body quickly.      Pertinent History  HA, TIA, HTN, fibromyalcial, A-fib, carotid stenosis     Limitations  Other (comment)   various activities according to when the vertigo hits.    Patient Stated Goals  less dizzy spells     Currently in Pain?  No/denies         [facility] PT Assessment - 05/12/18 0001      Assessment   Medical Diagnosis  vertigo    Referring Provider (PT)  Sarina Ill    Onset Date/Surgical Date  03/25/18   acute exacerbation was 1x a week now 2x a day .   Prior Therapy  none      Precautions   Precautions  Fall      Restrictions   Weight Bearing Restrictions  No      Balance Screen   Has the patient fallen in the past 6 months  No      [place] residence      Prior Function   Vocation  Retired      Associate Professor   Overall Cognitive Status  Within Functional Limits for tasks assessed      Observation/Other Assessments   Focus on Therapeutic Outcomes (FOTO)   64      Functional Tests   Functional tests  Single leg stance      Single Leg Stance   Comments  RT 19; Lt 18      Posture/Postural Control   Posture/Postural Control  Postural limitations    Postural Limitations  Rounded Shoulders;Forward head;Decreased lumbar lordosis;Increased thoracic kyphosis      ROM / Strength   AROM / PROM / Strength  AROM      AROM   AROM Assessment Site  Cervical      Palpation   Palpation comment  marked mm spasm in B upper trap            Vestibular Assessment - 05/12/18 0001      Oculomotor Exam   Ocular ROM  normal     Head shaking Horizontal  Absent    Head Shaking Vertical  Absent    Smooth Pursuits  Intact  Saccades  Intact      Vestibulo-Ocular Reflex   VOR 1  Head Only (x 1 viewing)  negative     VOR 2 Head and Object (x 2 viewing)  negative       Positional Testing   Dix-Hallpike  Dix-Hallpike Right;Dix-Hallpike Left      Dix-Hallpike Right   Dix-Hallpike Right Duration  5 seconds     Dix-Hallpike Right Symptoms  Upbeat, left rotatory nystagmus      Dix-Hallpike Left   Dix-Hallpike Left Symptoms  No nystagmus          Objective measurements completed on examination: See above findings.      [facility] Adult PT Treatment/Exercise - 05/12/18 0001      Exercises   Exercises  Neck      Neck Exercises: Seated   Postural Training  Tall sitting; scapular and cervical retraction x 10       Vestibular Treatment/Exercise - 05/12/18 0001      Vestibular Treatment/Exercise   Vestibular Treatment Provided  Canalith Repositioning    Canalith Repositioning  Epley Manuever Right       EPLEY MANUEVER RIGHT   Number of Reps   2    Overall Response  No change            PT Education - 05/12/18 1443    Education Details  HEP; What BPPV is and how it is corrected.     Person(s) Educated  Patient    Methods  Explanation;Verbal cues;Handout;Demonstration    Comprehension  Verbalized understanding;Returned demonstration       PT Short Term Goals - 05/12/18 1453      PT SHORT TERM GOAL #1   Title  Pt to only be experiencing dizziness one time a day.     Time  2    Period  Weeks    Status  New    Target Date  05/26/18      PT SHORT TERM GOAL #2   Title  PT to be able to rotate her head bilaterally to 60 degrees for safer driving.     Time  2    Period  Weeks    Status  New        PT Long Term Goals - 05/12/18 1454      PT LONG TERM GOAL #1   Title  Pt to be experiencing dizziness at the most 2x a week     Time  4    Period  Weeks    Status  New    Target Date  06/09/18      PT LONG TERM GOAL #2   Title  Pt to be I in an advance HEP to allow pt to continue with self care to stop all sensation of dizziness    Time  4     Period  Weeks    Status  New             Plan - 05/12/18 1446    Clinical Impression Statement  [name] is a 69 yo female who has had a long history of migranes and vertigo.  She was experiencing vertigo about once a week but since a fall in January she has been  experiencing vertigo 2-3 x a day.  She is currently being referred to skilled therapy to improve her symptoms.  Evaluation demonstrates a positive Rt dix-hallpike manuever, forward head, rounded shoulders decreased cervical rotation and tight Upper trap mm.  [name]  will benefit from skilled PT to address these issues and improve her quality of life while decreasing her risk of falling.      Personal Factors and Comorbidities  Age;Comorbidity 3+;Finances;Time since onset of injury/illness/exacerbation    Comorbidities  migrane, DM, TIA, HTN ,fibromyalgia, A-fib     Examination-Activity Limitations  Other;Sleep    Stability/Clinical Decision Making  Stable/Uncomplicated    Clinical Decision Making  Moderate    Rehab Potential  Good    PT Frequency  2x / week    PT Duration  4 weeks    PT Treatment/Interventions  ADLs/Self Care Home Management;Canalith Repostioning;Therapeutic exercise;Balance training;Patient/family education;Manual techniques    PT Next Visit Plan  complete dix-hallpike if positive complet eply's manuever.  Begin manual for cervical as well as balance activity     PT Home Exercise Plan  HEP:  cervical retraction, scapular retraction, sitting tall and cervical rotation.        Patient will benefit from skilled therapeutic intervention in order to improve the following deficits and impairments:  Dizziness, Decreased range of motion, Decreased balance  Visit Diagnosis: BPPV (benign paroxysmal positional vertigo), right  Abnormal posture     Problem List Patient Active Problem List   Diagnosis Date Noted  . Vertigo 05/03/2018  . Fall 04/07/2018  . Vitamin D deficiency 04/28/2014  . BPPV (benign  paroxysmal positional vertigo) 04/26/2014  . Clostridium difficile infection 04/16/2014  . Arthritis, midfoot 08/16/2013  . Poor dentition 04/03/2013  . Healthcare maintenance 03/31/2013  . Abnormality of gait 07/25/2012  . Headache 02/25/2012  . Chronic back pain 10/07/2011  . Financial difficulties 10/07/2011  . Hemorrhoids 10/06/2011  . Anemia 10/02/2011  . Diverticulosis 10/02/2011  . Precordial pain 09/01/2011  . Nonspecific (abnormal) findings on radiological and other examination of gastrointestinal tract 08/07/2011  . Ventral hernia s/p laparoscopic lysis of adhesions and hernia repair with mesh 07/29/11 08/01/2011  . Preventative health care 04/28/2010  . Depression 05/09/2009  . OBESITY, UNSPECIFIED 11/06/2008  . Carotid stenosis 11/06/2008  . Rectal bleeding 09/14/2008  . FIBROMYALGIA, SEVERE 09/14/2008  . GERD 09/12/2008  . Osteopenia 06/22/2008  . Hyperlipidemia 06/30/2007  . TIA 06/01/2007  . DIABETIC PERIPHERAL NEUROPATHY 04/05/2006  . Diabetes type 2, controlled ([place]) 03/18/2006  . Hypothyroidism 12/21/2005  . Essential hypertension 12/21/2005  . Atrial fibrillation ([place]) 12/21/2005  . Osteoarthritis 12/21/2005    Rayetta Humphrey, PT CLT [phone] 05/12/2018, 2:59 PM  [facility] [address] [place], [place], [address] Phone: [phone]   Fax:  [phone]  Name: BILLYE PICKEREL MRN: [id number] Date of Birth: Nov 14, 1950

## 2018-05-12 NOTE — Patient Instructions (Addendum)
Scapular Retraction (Standing)    With arms at sides, pinch shoulder blades together. Repeat _10___ times per set. Do ___1_ sets per session. Do _2___ sessions per day.  http://orth.exer.us/944   Copyright  VHI. All rights reserved.  Flexibility: Neck Retraction    Pull head straight back, keeping eyes and jaw level. Repeat __10__ times per set. Do __1__ sets per session. Do _2___ sessions per day.  http://orth.exer.us/344   Copyright  VHI. All rights reserved.  AROM: Neck Rotation    Turn head slowly to look over one shoulder, then the other. Hold each position __5__ seconds. Repeat ____5 times per set. Do _1___ sets per session. Do _2___ sessions per day.  http://orth.exer.us/294   Copyright  VHI. All rights reserved.

## 2018-05-12 NOTE — Telephone Encounter (Signed)
05/12/18  Cindy asked me to call and see if this eval would come in at 1:45 instead of 2:30 because her 3:15 is a hands-on patient and she would have time to do documentation on the eval.

## 2018-05-13 ENCOUNTER — Encounter (HOSPITAL_COMMUNITY): Payer: Self-pay

## 2018-05-13 ENCOUNTER — Ambulatory Visit (HOSPITAL_COMMUNITY): Payer: PPO

## 2018-05-13 DIAGNOSIS — H8111 Benign paroxysmal vertigo, right ear: Secondary | ICD-10-CM | POA: Diagnosis not present

## 2018-05-13 DIAGNOSIS — R293 Abnormal posture: Secondary | ICD-10-CM

## 2018-05-13 NOTE — Therapy (Signed)
[facility], [place], [address] Phone: [phone]   Fax:  [phone]  Physical Therapy Treatment  Patient Details  Name: [name] MRN: [id number] Date of Birth: October 26, 1950 Referring Provider (PT): Sarina Ill   Encounter Date: 05/13/2018  PT End of Session - 05/13/18 1305    Visit Number  2    Number of Visits  8    Date for PT Re-Evaluation  06/11/18    Authorization Type  healthadvantage    Authorization Time Period  3/5-->06/09/18    PT Start Time  1301    PT Stop Time  1342    PT Time Calculation (min)  41 min    Activity Tolerance  Patient tolerated treatment well    Behavior During Therapy  [facility] for tasks assessed/performed       Past Medical History:  Diagnosis Date  . Abnormal CT scan 03/16/2017  . Atrial fibrillation ([place])   . Bunion   . Carotid stenosis   . Carpal tunnel syndrome    mild   . Cervical arthritis   . Cholecystitis    s/p cholecystectomy  . Chronic back pain   . Chronic gastritis   . Depression   . Diabetic peripheral neuropathy ([place])   . Diarrhea 04/05/2015  . Digoxin toxicity    08/2011  . Diverticulitis    dx 04/08/14  . Diverticulosis   . Dysphagia    no documented strictures but responded positively to dilation in past.   . Epileptic seizure, tonic ([place])    .No meds since age of 2.  . Family history of adverse reaction to anesthesia    "my mother may have"  . Fatty liver   . Fibromyalgia   . GERD (gastroesophageal reflux disease)   . H/O hiatal hernia    S/P hernia repair  . Hyperlipidemia   . Hypertension   . Hypothyroid   . Internal hemorrhoid   . Joint pain 05/08/2011  . Left knee pain 12/19/2014  . Migraine   . Mild cognitive impairment   . Muscle spasm of back 08/10/2013  . OSA (obstructive sleep apnea) dx'd 2003   "can't sleep w/that mask; lost some weight; maybe that's helped" (04/26/2014)  . Osteoarthritis   . Pneumonia X 2  . Right foot pain  08/03/2013  . Sarcoidosis   . Skin neoplasm   . TIA (transient ischemic attack)    "don't know when I had it" (04/26/2014)  . Transaminase or LDH elevation   . Type II diabetes mellitus ([place])     Past Surgical History:  Procedure Laterality Date  . ABDOMINAL HYSTERECTOMY  1980's   partial  . BILATERAL OOPHORECTOMY  1980's   "after partial hysterectomy"  . BREAST BIOPSY Right    benign  . BREAST EXCISIONAL BIOPSY Right 03/2006   benign  . BREAST SURGERY Right ~ 2010   excision milk duct;  . BUNIONECTOMY Bilateral   . CATARACT EXTRACTION W/PHACO  10/27/2010   Procedure: CATARACT EXTRACTION PHACO AND INTRAOCULAR LENS PLACEMENT (IOC);  Surgeon: Williams Che;  Location: [facility];  Service: Ophthalmology;  Laterality: Left;  CDE- 1.78  . CATARACT EXTRACTION W/PHACO  07/13/2011   Procedure: CATARACT EXTRACTION PHACO AND INTRAOCULAR LENS PLACEMENT (IOC);  Surgeon: Williams Che, MD;  Location: [facility];  Service: Ophthalmology;  Laterality: Right;  CDE:  1.65  . DILATION AND CURETTAGE OF UTERUS  1970; 1976  . ESOPHAGEAL DILATION  multiple  .  FOOT SURGERY Left ~ 2011   "straightened toe & scraped bone below big toe"  . FOOT SURGERY Right ~ 2011   "scraped bone of big toe; shortened middle toet"  . HIATAL HERNIA REPAIR  1990   "had to have scar tissue removed 6 months after repair"  . Inverted nipples  1992  . LAPAROSCOPIC CHOLECYSTECTOMY  1980's  . LIPOSUCTION  1992  . SALPINGOOPHORECTOMY Bilateral    "6 months or so after hysterectomy"  . SKIN CANCER EXCISION  1990's   "front side of right shin"  . TUBAL LIGATION  1976  . VENTRAL HERNIA REPAIR  07/29/2011   Procedure: LAPAROSCOPIC VENTRAL HERNIA;  Surgeon: Adin Hector, MD;  Location: [facility];  Service: General;  Laterality: N/A;  multiple incarcerated hernias with mesh    There were no vitals filed for this visit.  Subjective Assessment - 05/13/18 1301    Subjective  Pt stated she hasn't had any dizziness since last apt.,  continues to have intermittent headaches pain scale 7/10    Pertinent History  HA, TIA, HTN, fibromyalcial, A-fib, carotid stenosis     Patient Stated Goals  less dizzy spells     Currently in Pain?  Yes    Pain Score  7     Pain Location  Head    Pain Orientation  --   Frontal aspect more Rt than left   Pain Descriptors / Indicators  Aching    Pain Onset  More than a month ago    Pain Frequency  Intermittent    Aggravating Factors   pushes through it    Pain Relieving Factors  unsure                       [facility] Adult PT Treatment/Exercise - 05/13/18 0001      Neck Exercises: Seated   Cervical Rotation  Left;Both;Right;10 reps    Cervical Rotation Limitations  3D cervical excursion    W Back  10 reps    W Back Limitations  3" holds    Postural Training  Tall sitting; scapular and cervical retraction x 10       Manual Therapy   Manual Therapy  Soft tissue mobilization;Other (comment)    Manual therapy comments  Manual complete separate than rest of tx    Soft tissue mobilization  Supine upper trap wiht LE elevated    Other Manual Therapy  suboccipital release form          Balance Exercises - 05/13/18 1336      Balance Exercises: Standing   Tandem Stance  Eyes open;2 reps;30 secs;Foam/compliant surface   1) static 2) head turns 3) foam then 4) wiht head turns   SLS  3 reps        PT Education - 05/13/18 1309    Education Details  Reviewed goals and educated importance of compliance iwht HEP, reviewed exercises.    Person(s) Educated  Patient    Methods  Explanation;Demonstration    Comprehension  Returned demonstration;Verbalized understanding       PT Short Term Goals - 05/12/18 1453      PT SHORT TERM GOAL #1   Title  Pt to only be experiencing dizziness one time a day.     Time  2    Period  Weeks    Status  New    Target Date  05/26/18      PT SHORT TERM GOAL #2  Title  PT to be able to rotate her head bilaterally to 60 degrees for  safer driving.     Time  2    Period  Weeks    Status  New        PT Long Term Goals - 05/12/18 1454      PT LONG TERM GOAL #1   Title  Pt to be experiencing dizziness at the most 2x a week     Time  4    Period  Weeks    Status  New    Target Date  06/09/18      PT LONG TERM GOAL #2   Title  Pt to be I in an advance HEP to allow pt to continue with self care to stop all sensation of dizziness    Time  4    Period  Weeks    Status  New            Plan - 05/13/18 1500    Clinical Impression Statement  Reviewed goals and educated pt importance of compliance wiht HEP.  No reports of dizziness this session so session focus on postural strengthening, manual to address soft tissue restrictions wiht upper trap and balance training.  Min cueing to improve form with cervical retraction to reduce cervical flexion and improve retraction.  Min assistance and cueing for visual awareness assisted with balance in NBOS/dynamic surface.  EOS pt reports headache resolved, no reports of pain through session.      Personal Factors and Comorbidities  Age;Comorbidity 3+;Finances;Time since onset of injury/illness/exacerbation    Comorbidities  migrane, DM, TIA, HTN ,fibromyalgia, A-fib     Examination-Activity Limitations  Other;Sleep    Stability/Clinical Decision Making  Stable/Uncomplicated    Rehab Potential  Good    PT Frequency  2x / week    PT Duration  4 weeks    PT Treatment/Interventions  ADLs/Self Care Home Management;Canalith Repostioning;Therapeutic exercise;Balance training;Patient/family education;Manual techniques    PT Next Visit Plan  PRN- complete dix-hallpike if positive complet eply's manuever.  Continue manual for cervical as well as balance activity.  Continue head movements and begin UE flexion in tandem on foam next session.    PT Home Exercise Plan  HEP:  cervical retraction, scapular retraction, sitting tall and cervical rotation.        Patient will benefit from  skilled therapeutic intervention in order to improve the following deficits and impairments:  Dizziness, Decreased range of motion, Decreased balance  Visit Diagnosis: BPPV (benign paroxysmal positional vertigo), right  Abnormal posture     Problem List Patient Active Problem List   Diagnosis Date Noted  . Vertigo 05/03/2018  . Fall 04/07/2018  . Vitamin D deficiency 04/28/2014  . BPPV (benign paroxysmal positional vertigo) 04/26/2014  . Clostridium difficile infection 04/16/2014  . Arthritis, midfoot 08/16/2013  . Poor dentition 04/03/2013  . Healthcare maintenance 03/31/2013  . Abnormality of gait 07/25/2012  . Headache 02/25/2012  . Chronic back pain 10/07/2011  . Financial difficulties 10/07/2011  . Hemorrhoids 10/06/2011  . Anemia 10/02/2011  . Diverticulosis 10/02/2011  . Precordial pain 09/01/2011  . Nonspecific (abnormal) findings on radiological and other examination of gastrointestinal tract 08/07/2011  . Ventral hernia s/p laparoscopic lysis of adhesions and hernia repair with mesh 07/29/11 08/01/2011  . Preventative health care 04/28/2010  . Depression 05/09/2009  . OBESITY, UNSPECIFIED 11/06/2008  . Carotid stenosis 11/06/2008  . Rectal bleeding 09/14/2008  . FIBROMYALGIA, SEVERE 09/14/2008  . GERD 09/12/2008  .  Osteopenia 06/22/2008  . Hyperlipidemia 06/30/2007  . TIA 06/01/2007  . DIABETIC PERIPHERAL NEUROPATHY 04/05/2006  . Diabetes type 2, controlled ([place]) 03/18/2006  . Hypothyroidism 12/21/2005  . Essential hypertension 12/21/2005  . Atrial fibrillation ([place]) 12/21/2005  . Osteoarthritis 12/21/2005   Ihor Austin, [place]; [place]  Aldona Lento 05/13/2018, 3:07 PM  [facility] [address] [place], [place], [address] Phone: [phone]   Fax:  [phone]  Name: [name] MRN: [id number] Date of Birth: [birth year]

## 2018-05-17 ENCOUNTER — Telehealth (HOSPITAL_COMMUNITY): Payer: Self-pay | Admitting: Internal Medicine

## 2018-05-17 ENCOUNTER — Ambulatory Visit (HOSPITAL_COMMUNITY): Payer: PPO

## 2018-05-17 NOTE — Telephone Encounter (Signed)
05/17/18  pt left a message to cx said that she wasn't feeling well

## 2018-05-19 ENCOUNTER — Encounter (HOSPITAL_COMMUNITY): Payer: Self-pay | Admitting: Physical Therapy

## 2018-05-19 ENCOUNTER — Ambulatory Visit (HOSPITAL_COMMUNITY): Payer: PPO | Admitting: Physical Therapy

## 2018-05-19 ENCOUNTER — Other Ambulatory Visit: Payer: Self-pay

## 2018-05-19 DIAGNOSIS — R293 Abnormal posture: Secondary | ICD-10-CM

## 2018-05-19 DIAGNOSIS — H8111 Benign paroxysmal vertigo, right ear: Secondary | ICD-10-CM | POA: Diagnosis not present

## 2018-05-19 NOTE — Therapy (Signed)
[place], [place], [address] Phone: [phone]   Fax:  [phone]  Physical Therapy Treatment/Discharge  Patient Details  Name: [name] MRN: [id number] Date of Birth: [birth year] Referring Provider (PT): Sarina Ill   PHYSICAL THERAPY DISCHARGE SUMMARY  Visits from Start of Care: 3  Current functional level related to goals / functional outcomes: See below    Remaining deficits: None for referral pt complains of chronic low back pain    Education / Equipment: HEP  Plan: Patient agrees to discharge.  Patient goals were not met. Patient is being discharged due to meeting the stated rehab goals.  ?????       Encounter Date: 05/19/2018  PT End of Session - 05/19/18 1435    Visit Number  3    Number of Visits  3   Date for PT Re-Evaluation  06/11/18    Authorization Type  healthadvantage    Authorization Time Period  3/5-->06/09/18    PT Start Time  1435    PT Stop Time  1510    PT Time Calculation (min)  35 min    Activity Tolerance  Patient tolerated treatment well    Behavior During Therapy  WFL for tasks assessed/performed       Past Medical History:  Diagnosis Date  . Abnormal CT scan 03/16/2017  . Atrial fibrillation ([place])   . Bunion   . Carotid stenosis   . Carpal tunnel syndrome    mild   . Cervical arthritis   . Cholecystitis    s/p cholecystectomy  . Chronic back pain   . Chronic gastritis   . Depression   . Diabetic peripheral neuropathy ([place])   . Diarrhea 04/05/2015  . Digoxin toxicity    08/2011  . Diverticulitis    dx 04/08/14  . Diverticulosis   . Dysphagia    no documented strictures but responded positively to dilation in past.   . Epileptic seizure, tonic ([place])    .No meds since age of 82.  . Family history of adverse reaction to anesthesia    "my mother may have"  . Fatty liver   . Fibromyalgia   . GERD (gastroesophageal reflux disease)   . H/O hiatal hernia     S/P hernia repair  . Hyperlipidemia   . Hypertension   . Hypothyroid   . Internal hemorrhoid   . Joint pain 05/08/2011  . Left knee pain 12/19/2014  . Migraine   . Mild cognitive impairment   . Muscle spasm of back 08/10/2013  . OSA (obstructive sleep apnea) dx'd 2003   "can't sleep w/that mask; lost some weight; maybe that's helped" (04/26/2014)  . Osteoarthritis   . Pneumonia X 2  . Right foot pain 08/03/2013  . Sarcoidosis   . Skin neoplasm   . TIA (transient ischemic attack)    "don't know when I had it" (04/26/2014)  . Transaminase or LDH elevation   . Type II diabetes mellitus ([place])     Past Surgical History:  Procedure Laterality Date  . ABDOMINAL HYSTERECTOMY  1980's   partial  . BILATERAL OOPHORECTOMY  1980's   "after partial hysterectomy"  . BREAST BIOPSY Right    benign  . BREAST EXCISIONAL BIOPSY Right 03/2006   benign  . BREAST SURGERY Right ~ 2010   excision milk duct;  . BUNIONECTOMY Bilateral   . CATARACT EXTRACTION W/PHACO  10/27/2010   Procedure: CATARACT EXTRACTION PHACO AND INTRAOCULAR LENS PLACEMENT (  [place]);  Surgeon: Williams Che;  Location: [facility];  Service: Ophthalmology;  Laterality: Left;  CDE- 1.78  . CATARACT EXTRACTION W/PHACO  07/13/2011   Procedure: CATARACT EXTRACTION PHACO AND INTRAOCULAR LENS PLACEMENT ([facility]);  Surgeon: Williams Che, MD;  Location: [facility];  Service: Ophthalmology;  Laterality: Right;  CDE:  1.65  . DILATION AND CURETTAGE OF UTERUS  1970; 1976  . ESOPHAGEAL DILATION  multiple  . FOOT SURGERY Left ~ 2011   "straightened toe & scraped bone below big toe"  . FOOT SURGERY Right ~ 2011   "scraped bone of big toe; shortened middle toet"  . HIATAL HERNIA REPAIR  1990   "had to have scar tissue removed 6 months after repair"  . Inverted nipples  1992  . LAPAROSCOPIC CHOLECYSTECTOMY  1980's  . LIPOSUCTION  1992  . SALPINGOOPHORECTOMY Bilateral    "6 months or so after hysterectomy"  . SKIN CANCER EXCISION  1990's   "front  side of right shin"  . TUBAL LIGATION  1976  . VENTRAL HERNIA REPAIR  07/29/2011   Procedure: LAPAROSCOPIC VENTRAL HERNIA;  Surgeon: Adin Hector, MD;  Location: [facility];  Service: General;  Laterality: N/A;  multiple incarcerated hernias with mesh    There were no vitals filed for this visit.  Subjective Assessment - 05/19/18 1450    Subjective  Pt states that she has not have any sx of dizziness for at least a week.     Pertinent History  HA, TIA, HTN, fibromyalcial, A-fib, carotid stenosis     Patient Stated Goals  less dizzy spells     Currently in Pain?  Yes    Pain Score  9     Pain Location  Back    Pain Orientation  Lower    Pain Descriptors / Indicators  Aching    Pain Type  Chronic pain    Pain Onset  More than a month ago    Pain Frequency  Constant    Aggravating Factors   leaning forward     Pain Relieving Factors  lying down     Effect of Pain on Daily Activities  limits          [facility] PT Assessment - 05/19/18 0001      Single Leg Stance   Comments  LT 60  was 18; RT 42   was 19      AROM   Cervical - Right Rotation  65    Cervical - Left Rotation  65            [facility] Adult PT Treatment/Exercise - 05/19/18 0001      Exercises   Exercises  Neck      Neck Exercises: Seated   Cervical Rotation  --    Cervical Rotation Limitations  --    W Back  10 reps    W Back Limitations  3" holds    Postural Training  --      Neck Exercises: Supine   Other Supine Exercise  cervical and scapular retraction x 10      Manual Therapy   Manual Therapy  Soft tissue mobilization;Other (comment)    Manual therapy comments  Manual complete separate than rest of tx    Soft tissue mobilization  Supine upper trap wiht LE elevated    Other Manual Therapy  suboccipital release form               PT Short Term Goals -  05/19/18 1455      PT SHORT TERM GOAL #1   Title  Pt to only be experiencing dizziness one time a day.     Time  2    Period  Weeks     Status  Achieved    Target Date  05/26/18      PT SHORT TERM GOAL #2   Title  PT to be able to rotate her head bilaterally to 60 degrees for safer driving.     Time  2    Period  Weeks    Status  Achieved        PT Long Term Goals - 05/19/18 1455      PT LONG TERM GOAL #1   Title  Pt to be experiencing dizziness at the most 2x a week     Time  4    Period  Weeks    Status  Partially Met   Has been over 1 week      PT LONG TERM GOAL #2   Title  Pt to be I in an advance HEP to allow pt to continue with self care to stop all sensation of dizziness    Time  4    Period  Weeks    Status  Achieved            Plan - 05/19/18 1436    Clinical Impression Statement  PT continues to be free of dizziness. ROM and balance have improved.  All reasons why pt has been referred to therapy has been resolved.     Personal Factors and Comorbidities  Age;Comorbidity 3+;Finances;Time since onset of injury/illness/exacerbation    Comorbidities  migrane, DM, TIA, HTN ,fibromyalgia, A-fib     Examination-Activity Limitations  Other;Sleep    Stability/Clinical Decision Making  Stable/Uncomplicated    Rehab Potential  Good    PT Frequency  2x / week    PT Duration  4 weeks    PT Treatment/Interventions  ADLs/Self Care Home Management;Canalith Repostioning;Therapeutic exercise;Balance training;Patient/family education;Manual techniques    PT Next Visit Plan  All goals met will discharge for dizziness.  Pt may return for LBP     PT Home Exercise Plan  HEP:  cervical retraction, scapular retraction, sitting tall and cervical rotation.        Patient will benefit from skilled therapeutic intervention in order to improve the following deficits and impairments:  Dizziness, Decreased range of motion, Decreased balance  Visit Diagnosis: BPPV (benign paroxysmal positional vertigo), right  Abnormal posture     Problem List Patient Active Problem List   Diagnosis Date Noted  . Vertigo  05/03/2018  . Fall 04/07/2018  . Vitamin D deficiency 04/28/2014  . BPPV (benign paroxysmal positional vertigo) 04/26/2014  . Clostridium difficile infection 04/16/2014  . Arthritis, midfoot 08/16/2013  . Poor dentition 04/03/2013  . Healthcare maintenance 03/31/2013  . Abnormality of gait 07/25/2012  . Headache 02/25/2012  . Chronic back pain 10/07/2011  . Financial difficulties 10/07/2011  . Hemorrhoids 10/06/2011  . Anemia 10/02/2011  . Diverticulosis 10/02/2011  . Precordial pain 09/01/2011  . Nonspecific (abnormal) findings on radiological and other examination of gastrointestinal tract 08/07/2011  . Ventral hernia s/p laparoscopic lysis of adhesions and hernia repair with mesh 07/29/11 08/01/2011  . Preventative health care 04/28/2010  . Depression 05/09/2009  . OBESITY, UNSPECIFIED 11/06/2008  . Carotid stenosis 11/06/2008  . Rectal bleeding 09/14/2008  . FIBROMYALGIA, SEVERE 09/14/2008  . GERD 09/12/2008  . Osteopenia 06/22/2008  .  Hyperlipidemia 06/30/2007  . TIA 06/01/2007  . DIABETIC PERIPHERAL NEUROPATHY 04/05/2006  . Diabetes type 2, controlled ([place]) 03/18/2006  . Hypothyroidism 12/21/2005  . Essential hypertension 12/21/2005  . Atrial fibrillation ([place]) 12/21/2005  . Osteoarthritis 12/21/2005    Rayetta Humphrey, PT CLT [phone] 05/19/2018, 3:07 PM  [facility] [address] [place], [place], [address] Phone: [phone]   Fax:  [phone]  Name: [name] MRN: [id number] Date of Birth: 08-26-1950

## 2018-05-23 ENCOUNTER — Other Ambulatory Visit: Payer: Self-pay | Admitting: Internal Medicine

## 2018-05-23 DIAGNOSIS — Z1231 Encounter for screening mammogram for malignant neoplasm of breast: Secondary | ICD-10-CM

## 2018-05-24 ENCOUNTER — Ambulatory Visit (HOSPITAL_COMMUNITY): Payer: PPO | Admitting: Physical Therapy

## 2018-05-24 ENCOUNTER — Ambulatory Visit: Payer: PPO | Admitting: Neurology

## 2018-05-26 ENCOUNTER — Ambulatory Visit (HOSPITAL_COMMUNITY): Payer: PPO | Admitting: Physical Therapy

## 2018-05-31 ENCOUNTER — Encounter (HOSPITAL_COMMUNITY): Payer: PPO | Admitting: Physical Therapy

## 2018-06-02 ENCOUNTER — Encounter (HOSPITAL_COMMUNITY): Payer: PPO

## 2018-06-07 ENCOUNTER — Encounter (HOSPITAL_COMMUNITY): Payer: PPO | Admitting: Physical Therapy

## 2018-06-09 ENCOUNTER — Encounter (HOSPITAL_COMMUNITY): Payer: PPO | Admitting: Physical Therapy

## 2018-07-07 ENCOUNTER — Ambulatory Visit (INDEPENDENT_AMBULATORY_CARE_PROVIDER_SITE_OTHER): Payer: PPO | Admitting: Internal Medicine

## 2018-07-07 ENCOUNTER — Ambulatory Visit: Payer: PPO

## 2018-07-07 ENCOUNTER — Other Ambulatory Visit: Payer: Self-pay

## 2018-07-07 DIAGNOSIS — G44221 Chronic tension-type headache, intractable: Secondary | ICD-10-CM | POA: Diagnosis not present

## 2018-07-07 DIAGNOSIS — M545 Low back pain, unspecified: Secondary | ICD-10-CM

## 2018-07-07 DIAGNOSIS — G8929 Other chronic pain: Secondary | ICD-10-CM | POA: Diagnosis not present

## 2018-07-07 NOTE — Progress Notes (Signed)
   CC: Headaches and back pain  This is a telephone encounter between [name] and [employer] on 07/07/2018 for headaches and back pain. The visit was conducted with the patient located at home and [facility] at [facility]. The patient's identity was confirmed using their DOB and current address. The patient has consented to being evaluated through a telephone encounter and understands the associated risks (an examination cannot be done and the patient may need to come in for an appointment) / benefits (allows the patient to remain at home, decreasing exposure to coronavirus). I personally spent 10 minutes on medical discussion.   HPI:  [name] is a 68 y.o. with PMH as below.   Please see A&P for assessment of the patient's acute and chronic medical conditions.   Past Medical History:  Diagnosis Date  . Abnormal CT scan 03/16/2017  . Atrial fibrillation ([place])   . Bunion   . Carotid stenosis   . Carpal tunnel syndrome    mild   . Cervical arthritis   . Cholecystitis    s/p cholecystectomy  . Chronic back pain   . Chronic gastritis   . Depression   . Diabetic peripheral neuropathy ([place])   . Diarrhea 04/05/2015  . Digoxin toxicity    08/2011  . Diverticulitis    dx 04/08/14  . Diverticulosis   . Dysphagia    no documented strictures but responded positively to dilation in past.   . Epileptic seizure, tonic ([place])    .No meds since age of 55.  . Family history of adverse reaction to anesthesia    "my mother may have"  . Fatty liver   . Fibromyalgia   . GERD (gastroesophageal reflux disease)   . H/O hiatal hernia    S/P hernia repair  . Hyperlipidemia   . Hypertension   . Hypothyroid   . Internal hemorrhoid   . Joint pain 05/08/2011  . Left knee pain 12/19/2014  . Migraine   . Mild cognitive impairment   . Muscle spasm of back 08/10/2013  . OSA (obstructive sleep apnea) dx'd 2003   "can't sleep w/that mask; lost some weight; maybe that's helped" (04/26/2014)  .  Osteoarthritis   . Pneumonia X 2  . Right foot pain 08/03/2013  . Sarcoidosis   . Skin neoplasm   . TIA (transient ischemic attack)    "don't know when I had it" (04/26/2014)  . Transaminase or LDH elevation   . Type II diabetes mellitus ([place])    Review of Systems:  Performed and all others negative.  Assessment & Plan:   See Encounters Tab for problem based charting.  Patient discussed with Dr. Dareen Piano

## 2018-07-07 NOTE — Assessment & Plan Note (Signed)
HPI:  Patient recently seen by neurology for headaches. Felt to be vestibular migraines. She was sent to vestibular rehab. She states that she has done several sessions of vestibular rehab and is not felt any different. She continues to have 2 to 3 headaches per week. She is not followed up with neurology. She is not using anything for her pain.  A/P: - She will call neurology to discuss prophylactic medications.

## 2018-07-07 NOTE — Progress Notes (Signed)
Internal Medicine Clinic Attending  Case discussed with Dr. Helberg at the time of the visit.  We reviewed the resident's history and exam and pertinent patient test results.  I agree with the assessment, diagnosis, and plan of care documented in the resident's note.    

## 2018-07-07 NOTE — Assessment & Plan Note (Signed)
HPI:  Patient with chronic back pain. Primarily thoracic and lumbar spine. She states that she feels her bones grind. She has been told that she has arthritis in the past and states that she feels it is getting worse. She is wondering what she can do for this. She an MRI in 2015 that showed multilevel facet arthrosis. She is asking for an orthopedic referral. She cannot use NSAIDs due to Xarelto. She is just been using Tylenol.  A/P: - Referral to ortho for consideration of facet joint injection  - Will start lyrica depending on what neurology starts for the patient's headaches.

## 2018-07-08 ENCOUNTER — Other Ambulatory Visit: Payer: Self-pay | Admitting: *Deleted

## 2018-07-08 DIAGNOSIS — K219 Gastro-esophageal reflux disease without esophagitis: Secondary | ICD-10-CM

## 2018-07-08 DIAGNOSIS — E039 Hypothyroidism, unspecified: Secondary | ICD-10-CM

## 2018-07-08 DIAGNOSIS — I482 Chronic atrial fibrillation, unspecified: Secondary | ICD-10-CM

## 2018-07-08 DIAGNOSIS — I10 Essential (primary) hypertension: Secondary | ICD-10-CM

## 2018-07-08 MED ORDER — LOSARTAN POTASSIUM 25 MG PO TABS: 12.5000 mg | ORAL_TABLET | Freq: Every day | ORAL | 1 refills | Status: DC

## 2018-07-08 MED ORDER — ATENOLOL 25 MG PO TABS: ORAL_TABLET | ORAL | 1 refills | Status: DC

## 2018-07-08 MED ORDER — LEVOTHYROXINE SODIUM 50 MCG PO TABS: 50.0000 ug | ORAL_TABLET | Freq: Every morning | ORAL | 1 refills | Status: DC

## 2018-07-08 MED ORDER — DILTIAZEM HCL ER COATED BEADS 180 MG PO CP24: 180.0000 mg | ORAL_CAPSULE | Freq: Every day | ORAL | 1 refills | Status: DC

## 2018-07-08 MED ORDER — FAMOTIDINE 20 MG PO TABS: 20.0000 mg | ORAL_TABLET | Freq: Every day | ORAL | 1 refills | Status: DC

## 2018-07-12 ENCOUNTER — Encounter: Payer: Self-pay | Admitting: Family Medicine

## 2018-07-12 ENCOUNTER — Other Ambulatory Visit: Payer: Self-pay

## 2018-07-12 ENCOUNTER — Ambulatory Visit (INDEPENDENT_AMBULATORY_CARE_PROVIDER_SITE_OTHER): Payer: PPO | Admitting: Family Medicine

## 2018-07-12 DIAGNOSIS — M47812 Spondylosis without myelopathy or radiculopathy, cervical region: Secondary | ICD-10-CM

## 2018-07-12 DIAGNOSIS — M47816 Spondylosis without myelopathy or radiculopathy, lumbar region: Secondary | ICD-10-CM | POA: Diagnosis not present

## 2018-07-12 MED ORDER — BACLOFEN 10 MG PO TABS: 5.0000 mg | ORAL_TABLET | Freq: Three times a day (TID) | ORAL | 3 refills | Status: DC | PRN

## 2018-07-12 NOTE — Patient Instructions (Signed)
   Turmeric 500 mg twice daily  Glucosamine 1,000 mg twice daily

## 2018-07-12 NOTE — Progress Notes (Signed)
Office Visit Note   Patient: [name]           Date of Birth: 1951-02-09           MRN: [id number] Visit Date: 07/12/2018 Requested by: Aldine Contes, MD [address], [place], [place] [address] PCP: Ina Homes, MD  Subjective: Chief Complaint  Patient presents with  . Lower Back - Pain    Chronic pain in upper portion of lower back, occasionally more on the right, occasionally more on the left. Does not radiate down the legs.  . Neck - Pain    Chronic pain in the neck with headaches (at least 4 a week). No radiating pain down the arms.    HPI: She is here with neck and low back pain.  Her low back has bothered her for about 5 years.  Around that time she had MRI scan showing spondylosis at multiple levels, she underwent epidural steroid injection with minimal improvement.  She has been managing her pain since then but a few months ago she slipped and fell backward landing flat on her back.  This significantly exacerbated her low back pain and also triggered neck pain with headaches.  She is tried Tylenol with minimal improvement.  She is on anticoagulants and cannot take NSAIDs.  Denies any upper or lower extremity radicular pain, the pain seems to be localized to the neck and along the lumbar spine in the midline.               ROS: No fevers, chills, night sweats.  Denies any bowel or bladder dysfunction.  She has diet-controlled diabetes, atrial fibrillation and hypertension.  All other systems were reviewed and are negative.  Objective: Vital Signs: LMP 06/03/1978   Physical Exam:  General:  Alert and oriented, in no acute distress. Pulm:  Breathing unlabored. Psy:  Normal mood, congruent affect. Skin: No visible rash. Neck: Good range of motion in all directions with negative Spurling's test.  She is tender to palpation in the paraspinous muscles diffusely but especially near the C4-5 levels.  Upper extremity strength and reflexes are normal. Low  back: No tenderness over the spinous processes but she does have paraspinous muscle tenderness from L1-S1.  No tenderness along the SI joints or in the sciatic notch, negative straight leg raise.  Lower extremity strength and reflexes are normal.  Imaging: CT scan cervical spine viewed on computer from January shows substantial facet joint arthropathy at multiple levels.  Lumbar MRI scan from 5 years ago shows moderate L4-5 facet hypertrophy and mild at L5-S1.   Assessment & Plan: 1.  Chronic neck and low back pain, suspect due to facet joint arthropathy. -Trial of physical therapy at [facility].  Prescription for baclofen to take as needed.  If symptoms do not improve could contemplate referral to Dr. Ernestina Patches for consideration of facet injections.     Procedures: No procedures performed  No notes on file     PMFS History: Patient Active Problem List   Diagnosis Date Noted  . Vertigo 05/03/2018  . Fall 04/07/2018  . Vitamin D deficiency 04/28/2014  . BPPV (benign paroxysmal positional vertigo) 04/26/2014  . Clostridium difficile infection 04/16/2014  . Arthritis, midfoot 08/16/2013  . Poor dentition 04/03/2013  . Healthcare maintenance 03/31/2013  . Abnormality of gait 07/25/2012  . Headache 02/25/2012  . Chronic back pain 10/07/2011  . Financial difficulties 10/07/2011  . Hemorrhoids 10/06/2011  . Anemia 10/02/2011  . Diverticulosis 10/02/2011  .  Precordial pain 09/01/2011  . Nonspecific (abnormal) findings on radiological and other examination of gastrointestinal tract 08/07/2011  . Ventral hernia s/p laparoscopic lysis of adhesions and hernia repair with mesh 07/29/11 08/01/2011  . Preventative health care 04/28/2010  . Depression 05/09/2009  . OBESITY, UNSPECIFIED 11/06/2008  . Carotid stenosis 11/06/2008  . Rectal bleeding 09/14/2008  . FIBROMYALGIA, SEVERE 09/14/2008  . GERD 09/12/2008  . Osteopenia 06/22/2008  . Hyperlipidemia 06/30/2007  . TIA 06/01/2007  .  DIABETIC PERIPHERAL NEUROPATHY 04/05/2006  . Diabetes type 2, controlled ([place]) 03/18/2006  . Hypothyroidism 12/21/2005  . Essential hypertension 12/21/2005  . Atrial fibrillation ([place]) 12/21/2005  . Osteoarthritis 12/21/2005   Past Medical History:  Diagnosis Date  . Abnormal CT scan 03/16/2017  . Atrial fibrillation ([place])   . Bunion   . Carotid stenosis   . Carpal tunnel syndrome    mild   . Cervical arthritis   . Cholecystitis    s/p cholecystectomy  . Chronic back pain   . Chronic gastritis   . Depression   . Diabetic peripheral neuropathy ([place])   . Diarrhea 04/05/2015  . Digoxin toxicity    08/2011  . Diverticulitis    dx 04/08/14  . Diverticulosis   . Dysphagia    no documented strictures but responded positively to dilation in past.   . Epileptic seizure, tonic ([place])    .No meds since age of 22.  . Family history of adverse reaction to anesthesia    "my mother may have"  . Fatty liver   . Fibromyalgia   . GERD (gastroesophageal reflux disease)   . H/O hiatal hernia    S/P hernia repair  . Hyperlipidemia   . Hypertension   . Hypothyroid   . Internal hemorrhoid   . Joint pain 05/08/2011  . Left knee pain 12/19/2014  . Migraine   . Mild cognitive impairment   . Muscle spasm of back 08/10/2013  . OSA (obstructive sleep apnea) dx'd 2003   "can't sleep w/that mask; lost some weight; maybe that's helped" (04/26/2014)  . Osteoarthritis   . Pneumonia X 2  . Right foot pain 08/03/2013  . Sarcoidosis   . Skin neoplasm   . TIA (transient ischemic attack)    "don't know when I had it" (04/26/2014)  . Transaminase or LDH elevation   . Type II diabetes mellitus ([facility])     Family History  Problem Relation Age of Onset  . Crohn's disease Mother   . Colitis Mother        Crohns  . Anesthesia problems Mother   . Arthritis Mother        rheumatoid; maternal side of family also with RA  . Pneumonia Mother   . Diabetes Father   . Heart disease Father   . Melanoma Father   .  Coronary artery disease Father   . Skin cancer Father   . Stroke Father   . Diabetes Sister   . Migraines Sister   . Depression Maternal Uncle   . Dementia Maternal Uncle   . Colon cancer Paternal Uncle        ? if stomach or colon   . Hypotension Neg Hx   . Malignant hyperthermia Neg Hx   . Pseudochol deficiency Neg Hx     Past Surgical History:  Procedure Laterality Date  . ABDOMINAL HYSTERECTOMY  1980's   partial  . BILATERAL OOPHORECTOMY  1980's   "after partial hysterectomy"  . BREAST BIOPSY Right  benign  . BREAST EXCISIONAL BIOPSY Right 03/2006   benign  . BREAST SURGERY Right ~ 2010   excision milk duct;  . BUNIONECTOMY Bilateral   . CATARACT EXTRACTION W/PHACO  10/27/2010   Procedure: CATARACT EXTRACTION PHACO AND INTRAOCULAR LENS PLACEMENT ([facility]);  Surgeon: Williams Che;  Location: [facility];  Service: Ophthalmology;  Laterality: Left;  CDE- 1.78  . CATARACT EXTRACTION W/PHACO  07/13/2011   Procedure: CATARACT EXTRACTION PHACO AND INTRAOCULAR LENS PLACEMENT ([facility]);  Surgeon: Williams Che, MD;  Location: [facility];  Service: Ophthalmology;  Laterality: Right;  CDE:  1.65  . DILATION AND CURETTAGE OF UTERUS  1970; 1976  . ESOPHAGEAL DILATION  multiple  . FOOT SURGERY Left ~ 2011   "straightened toe & scraped bone below big toe"  . FOOT SURGERY Right ~ 2011   "scraped bone of big toe; shortened middle toet"  . HIATAL HERNIA REPAIR  1990   "had to have scar tissue removed 6 months after repair"  . Inverted nipples  1992  . LAPAROSCOPIC CHOLECYSTECTOMY  1980's  . LIPOSUCTION  1992  . SALPINGOOPHORECTOMY Bilateral    "6 months or so after hysterectomy"  . SKIN CANCER EXCISION  1990's   "front side of right shin"  . TUBAL LIGATION  1976  . VENTRAL HERNIA REPAIR  07/29/2011   Procedure: LAPAROSCOPIC VENTRAL HERNIA;  Surgeon: Adin Hector, MD;  Location: [facility];  Service: General;  Laterality: N/A;  multiple incarcerated hernias with mesh   Social History    Occupational History  . Occupation: Medical laboratory scientific officer: DISABLED  Tobacco Use  . Smoking status: Never Smoker  . Smokeless tobacco: Never Used  Substance and Sexual Activity  . Alcohol use: Never    Alcohol/week: 0.0 standard drinks    Frequency: Never  . Drug use: Never  . Sexual activity: Never    Birth control/protection: Surgical

## 2018-07-20 ENCOUNTER — Telehealth: Payer: Self-pay | Admitting: Cardiology

## 2018-07-20 DIAGNOSIS — M542 Cervicalgia: Secondary | ICD-10-CM | POA: Diagnosis not present

## 2018-07-20 DIAGNOSIS — M545 Low back pain: Secondary | ICD-10-CM | POA: Diagnosis not present

## 2018-07-20 NOTE — Telephone Encounter (Signed)
[place] pending, smartphone, consent (verbal), pre reg complete 07/20/18 AF

## 2018-07-20 NOTE — Telephone Encounter (Signed)
Follow Up:; ° ° °Returning your call. °

## 2018-07-20 NOTE — Progress Notes (Signed)
Virtual Visit via Video Note   This visit type was conducted due to national recommendations for restrictions regarding the COVID-19 Pandemic (e.g. social distancing) in an effort to limit this patient's exposure and mitigate transmission in our community.  Due to her co-morbid illnesses, this patient is at least at moderate risk for complications without adequate follow up.  This format is felt to be most appropriate for this patient at this time.  All issues noted in this document were discussed and addressed.  A limited physical exam was performed with this format.  Please refer to the patient's chart for her consent to telehealth for [facility].   Date:  07/21/2018   ID:  [name], DOB Feb 18, 1951, MRN [id number]  Patient Location: Home Provider Location: Home  PCP:  Ina Homes, MD  Cardiologist:  Minus Breeding, MD  Electrophysiologist:  None   Evaluation Performed:  Follow-Up Visit  Chief Complaint:  Atrial fib  History of Present Illness:    [name] is a 68 y.o. female who presents for follow-up of atrial fibrillation. She has permanent atrial fibrillation.  Since I last saw her she has done OK.  She is under stress because she is having to move and needs not something she wants to do.  She says she gets emotional stress with this and sometimes has some chest tightness.  This is mild and it goes away when she emotionally settles down.  She is able to do activities such as carrying her groceries.  This bothers her back but does not cause chest discomfort.  She says this discomfort is mild.  There is no associated symptoms.  It does not radiate to her jaw or to her arms.  She is not having any presyncope or syncope.  She can feel her atrial fib at times but this is chronic and unchanged from previous.  The patient does not have symptoms concerning for COVID-19 infection (fever, chills, cough, or new shortness of breath).    Past Medical History:  Diagnosis Date   . Abnormal CT scan 03/16/2017  . Atrial fibrillation ([place])   . Bunion   . Carotid stenosis   . Carpal tunnel syndrome    mild   . Cervical arthritis   . Cholecystitis    s/p cholecystectomy  . Chronic back pain   . Chronic gastritis   . Depression   . Diabetic peripheral neuropathy ([place])   . Diarrhea 04/05/2015  . Digoxin toxicity    08/2011  . Diverticulitis    dx 04/08/14  . Diverticulosis   . Dysphagia    no documented strictures but responded positively to dilation in past.   . Epileptic seizure, tonic ([place])    .No meds since age of 69.  . Family history of adverse reaction to anesthesia    "my mother may have"  . Fatty liver   . Fibromyalgia   . GERD (gastroesophageal reflux disease)   . H/O hiatal hernia    S/P hernia repair  . Hyperlipidemia   . Hypertension   . Hypothyroid   . Internal hemorrhoid   . Joint pain 05/08/2011  . Left knee pain 12/19/2014  . Migraine   . Mild cognitive impairment   . Muscle spasm of back 08/10/2013  . OSA (obstructive sleep apnea) dx'd 2003   "can't sleep w/that mask; lost some weight; maybe that's helped" (04/26/2014)  . Osteoarthritis   . Pneumonia X 2  . Right foot pain 08/03/2013  . Sarcoidosis   .  Skin neoplasm   . TIA (transient ischemic attack)    "don't know when I had it" (04/26/2014)  . Transaminase or LDH elevation   . Type II diabetes mellitus ([place])    Past Surgical History:  Procedure Laterality Date  . ABDOMINAL HYSTERECTOMY  1980's   partial  . BILATERAL OOPHORECTOMY  1980's   "after partial hysterectomy"  . BREAST BIOPSY Right    benign  . BREAST EXCISIONAL BIOPSY Right 03/2006   benign  . BREAST SURGERY Right ~ 2010   excision milk duct;  . BUNIONECTOMY Bilateral   . CATARACT EXTRACTION W/PHACO  10/27/2010   Procedure: CATARACT EXTRACTION PHACO AND INTRAOCULAR LENS PLACEMENT (IOC);  Surgeon: Williams Che;  Location: [facility];  Service: Ophthalmology;  Laterality: Left;  CDE- 1.78  . CATARACT EXTRACTION  W/PHACO  07/13/2011   Procedure: CATARACT EXTRACTION PHACO AND INTRAOCULAR LENS PLACEMENT (IOC);  Surgeon: Williams Che, MD;  Location: [facility];  Service: Ophthalmology;  Laterality: Right;  CDE:  1.65  . DILATION AND CURETTAGE OF UTERUS  1970; 1976  . ESOPHAGEAL DILATION  multiple  . FOOT SURGERY Left ~ 2011   "straightened toe & scraped bone below big toe"  . FOOT SURGERY Right ~ 2011   "scraped bone of big toe; shortened middle toet"  . HIATAL HERNIA REPAIR  1990   "had to have scar tissue removed 6 months after repair"  . Inverted nipples  1992  . LAPAROSCOPIC CHOLECYSTECTOMY  1980's  . LIPOSUCTION  1992  . SALPINGOOPHORECTOMY Bilateral    "6 months or so after hysterectomy"  . SKIN CANCER EXCISION  1990's   "front side of right shin"  . TUBAL LIGATION  1976  . VENTRAL HERNIA REPAIR  07/29/2011   Procedure: LAPAROSCOPIC VENTRAL HERNIA;  Surgeon: Adin Hector, MD;  Location: [place];  Service: General;  Laterality: N/A;  multiple incarcerated hernias with mesh     Prior to Admission medications   Medication Sig Start Date End Date Taking? Authorizing Provider  atenolol (TENORMIN) 25 MG tablet TAKE 1 AND 1/2 TABLET TWICE A DAY 07/08/18  Yes Helberg, Larkin Ina, MD  baclofen (LIORESAL) 10 MG tablet Take 0.5-1 tablets (5-10 mg total) by mouth 3 (three) times daily as needed for muscle spasms. 07/12/18  Yes Hilts, Legrand Como, MD  Cholecalciferol (VITAMIN D3 PO) Take 5,000 Int'l Units by mouth daily.   Yes [provider]  diltiazem (CARTIA XT) 180 MG 24 hr capsule Take 1 capsule (180 mg total) by mouth daily. 07/08/18  Yes Helberg, Larkin Ina, MD  famotidine (PEPCID) 20 MG tablet Take 1 tablet (20 mg total) by mouth daily. 07/08/18  Yes Helberg, Larkin Ina, MD  FLUoxetine (PROZAC) 20 MG tablet Take 1 tablet (20 mg total) by mouth daily. 01/13/18  Yes Helberg, Larkin Ina, MD  levothyroxine (SYNTHROID) 50 MCG tablet Take 1 tablet (50 mcg total) by mouth every morning. 07/08/18  Yes Helberg, Larkin Ina, MD   losartan (COZAAR) 25 MG tablet Take 0.5 tablets (12.5 mg total) by mouth daily. 07/08/18  Yes Helberg, Larkin Ina, MD  Multiple Vitamin (MULTIVITAMIN) tablet Take 1 tablet by mouth daily.   Yes [provider]  ondansetron (ZOFRAN-ODT) 4 MG disintegrating tablet Take 1 tablet (4 mg total) by mouth every 8 (eight) hours as needed for nausea. 05/02/18  Yes Melvenia Beam, MD  rivaroxaban (XARELTO) 20 MG TABS tablet TAKE 1 TABLET DAILY WITH SUPPER 01/13/18  Yes Helberg, Larkin Ina, MD  rosuvastatin (CRESTOR) 20 MG tablet Take 1 tablet (20  mg total) by mouth daily. 01/13/18  Yes Ina Homes, MD    Allergies:   Gemfibrozil; Latex; Penicillins; Adhesive [tape]; Codeine; Digoxin and related; Metformin and related; Omeprazole; and Ace inhibitors   Social History   Tobacco Use  . Smoking status: Never Smoker  . Smokeless tobacco: Never Used  Substance Use Topics  . Alcohol use: Never    Alcohol/week: 0.0 standard drinks    Frequency: Never  . Drug use: Never     Family Hx: The patient's family history includes Anesthesia problems in her mother; Arthritis in her mother; Colitis in her mother; Colon cancer in her paternal uncle; Coronary artery disease in her father; Crohn's disease in her mother; Dementia in her maternal uncle; Depression in her maternal uncle; Diabetes in her father and sister; Heart disease in her father; Melanoma in her father; Migraines in her sister; Pneumonia in her mother; Skin cancer in her father; Stroke in her father. There is no history of Hypotension, Malignant hyperthermia, or Pseudochol deficiency.  ROS:   Please see the history of present illness.    Positive for back pain.  All other systems reviewed and are negative.   Prior CV studies:   The following studies were reviewed today:    Labs/Other Tests and Data Reviewed:    EKG:  No ECG reviewed.  Recent Labs: 01/13/2018: BUN 13; Creatinine, Ser 0.81; Potassium 4.2; Sodium 147; TSH 0.846   Recent  Lipid Panel Lab Results  Component Value Date/Time   CHOL 109 01/13/2018 02:31 PM   TRIG 150 (H) 01/13/2018 02:31 PM   HDL 52 01/13/2018 02:31 PM   CHOLHDL 2.1 01/13/2018 02:31 PM   CHOLHDL 2.9 04/19/2014 04:03 PM   LDLCALC 27 01/13/2018 02:31 PM    Wt Readings from Last 3 Encounters:  07/21/18 172 lb (78 kg)  05/02/18 175 lb (79.4 kg)  04/07/18 175 lb 14.4 oz (79.8 kg)     Objective:    Vital Signs:  Ht 5\' 3"  (1.6 m)   Wt 172 lb (78 kg)   LMP 06/03/1978   BMI 30.47 kg/m    VITAL SIGNS:  reviewed GEN:  no acute distress RESPIRATORY:  normal respiratory effort, symmetric expansion NEURO:  alert and oriented x 3, no obvious focal deficit PSYCH:  normal affect  ASSESSMENT & PLAN:    ATRIAL FIB:   [name] has a CHA2DS2 - VASc score of 4 with a risk of stroke of 4%.   The patient  tolerates this rhythm and rate control and anticoagulation. We will continue with the meds as listed.  She is overdue for blood work and I will arrange a CBC and BMET.   CHEST PAIN: This is very atypical.  She had a negative perfusion study last year.  I only further testing is indicated.  No change in therapy.  COVID-19 Education: The signs and symptoms of COVID-19 were discussed with the patient and how to seek care for testing (follow up with PCP or arrange E-visit).  The importance of social distancing was discussed today.  Time:   Today, I have spent 16 minutes with the patient with telehealth technology discussing the above problems.     Medication Adjustments/Labs and Tests Ordered: Current medicines are reviewed at length with the patient today.  Concerns regarding medicines are outlined above.   Tests Ordered: No orders of the defined types were placed in this encounter.   Medication Changes: No orders of the defined types were placed in this  encounter.   Disposition:  Follow up with me in one year.   Signed, Minus Breeding, MD  07/21/2018 2:09 PM    [place]

## 2018-07-21 ENCOUNTER — Telehealth (INDEPENDENT_AMBULATORY_CARE_PROVIDER_SITE_OTHER): Payer: PPO | Admitting: Cardiology

## 2018-07-21 ENCOUNTER — Encounter: Payer: Self-pay | Admitting: Cardiology

## 2018-07-21 VITALS — Ht 63.0 in | Wt 172.0 lb

## 2018-07-21 DIAGNOSIS — I4891 Unspecified atrial fibrillation: Secondary | ICD-10-CM

## 2018-07-21 DIAGNOSIS — Z79899 Other long term (current) drug therapy: Secondary | ICD-10-CM

## 2018-07-21 DIAGNOSIS — R079 Chest pain, unspecified: Secondary | ICD-10-CM

## 2018-07-21 DIAGNOSIS — Z7189 Other specified counseling: Secondary | ICD-10-CM

## 2018-07-21 NOTE — Patient Instructions (Signed)
Medication Instructions:  Continue current medications  If you need a refill on your cardiac medications before your next appointment, please call your pharmacy.  Labwork: CBC and BMP   Testing/Procedures: None Ordered   Follow-Up: You will need a follow up appointment in 1 Year.  Please call our office 2 months in advance to schedule this appointment.  You may see Minus Breeding, MD or one of the following Advanced Practice Providers on your designated Care Team:   Rosaria Ferries, PA-C . Jory Sims, DNP, ANP     At [facility], you and your health needs are our priority.  As part of our continuing mission to provide you with exceptional heart care, we have created designated Provider Care Teams.  These Care Teams include your primary Cardiologist (physician) and Advanced Practice Providers (APPs -  Physician Assistants and Nurse Practitioners) who all work together to provide you with the care you need, when you need it.  Thank you for choosing [facility] at [facility]!!

## 2018-07-22 DIAGNOSIS — E119 Type 2 diabetes mellitus without complications: Secondary | ICD-10-CM | POA: Diagnosis not present

## 2018-07-22 DIAGNOSIS — Z961 Presence of intraocular lens: Secondary | ICD-10-CM | POA: Diagnosis not present

## 2018-07-22 DIAGNOSIS — H10413 Chronic giant papillary conjunctivitis, bilateral: Secondary | ICD-10-CM | POA: Diagnosis not present

## 2018-07-22 DIAGNOSIS — H04123 Dry eye syndrome of bilateral lacrimal glands: Secondary | ICD-10-CM | POA: Diagnosis not present

## 2018-07-26 DIAGNOSIS — M545 Low back pain: Secondary | ICD-10-CM | POA: Diagnosis not present

## 2018-07-26 DIAGNOSIS — M542 Cervicalgia: Secondary | ICD-10-CM | POA: Diagnosis not present

## 2018-07-28 DIAGNOSIS — Z79899 Other long term (current) drug therapy: Secondary | ICD-10-CM | POA: Diagnosis not present

## 2018-07-28 DIAGNOSIS — M545 Low back pain: Secondary | ICD-10-CM | POA: Diagnosis not present

## 2018-07-28 DIAGNOSIS — M542 Cervicalgia: Secondary | ICD-10-CM | POA: Diagnosis not present

## 2018-07-29 LAB — CBC
Hematocrit: 39.1 % (ref 34.0–46.6)
Hemoglobin: 13.5 g/dL (ref 11.1–15.9)
MCH: 32.1 pg (ref 26.6–33.0)
MCHC: 34.5 g/dL (ref 31.5–35.7)
MCV: 93 fL (ref 79–97)
Platelets: 198 10*3/uL (ref 150–450)
RBC: 4.21 x10E6/uL (ref 3.77–5.28)
RDW: 12.6 % (ref 11.7–15.4)
WBC: 5.7 10*3/uL (ref 3.4–10.8)

## 2018-07-29 LAB — BASIC METABOLIC PANEL
BUN/Creatinine Ratio: 17 (ref 12–28)
BUN: 14 mg/dL (ref 8–27)
CO2: 22 mmol/L (ref 20–29)
Calcium: 10.1 mg/dL (ref 8.7–10.3)
Chloride: 106 mmol/L (ref 96–106)
Creatinine, Ser: 0.82 mg/dL (ref 0.57–1.00)
GFR calc Af Amer: 84 mL/min/{1.73_m2} (ref >59)
GFR calc non Af Amer: 73 mL/min/{1.73_m2} (ref >59)
Glucose: 116 mg/dL — ABNORMAL HIGH (ref 65–99)
Potassium: 4.5 mmol/L (ref 3.5–5.2)
Sodium: 142 mmol/L (ref 134–144)

## 2018-08-02 DIAGNOSIS — M545 Low back pain: Secondary | ICD-10-CM | POA: Diagnosis not present

## 2018-08-02 DIAGNOSIS — M542 Cervicalgia: Secondary | ICD-10-CM | POA: Diagnosis not present

## 2018-08-04 DIAGNOSIS — M542 Cervicalgia: Secondary | ICD-10-CM | POA: Diagnosis not present

## 2018-08-04 DIAGNOSIS — M545 Low back pain: Secondary | ICD-10-CM | POA: Diagnosis not present

## 2018-08-19 ENCOUNTER — Other Ambulatory Visit: Payer: Self-pay

## 2018-08-19 ENCOUNTER — Ambulatory Visit
Admission: RE | Admit: 2018-08-19 | Discharge: 2018-08-19 | Disposition: A | Discharge status: Home / Self Care | Payer: PPO | Source: Ambulatory Visit | Attending: Internal Medicine | Admitting: Internal Medicine

## 2018-08-19 DIAGNOSIS — Z1231 Encounter for screening mammogram for malignant neoplasm of breast: Secondary | ICD-10-CM

## 2018-09-07 ENCOUNTER — Other Ambulatory Visit: Payer: Self-pay | Admitting: *Deleted

## 2018-09-07 DIAGNOSIS — I482 Chronic atrial fibrillation, unspecified: Secondary | ICD-10-CM

## 2018-09-08 MED ORDER — RIVAROXABAN 20 MG PO TABS: ORAL_TABLET | ORAL | 1 refills | Status: DC

## 2018-10-01 ENCOUNTER — Other Ambulatory Visit: Payer: Self-pay | Admitting: Internal Medicine

## 2018-10-01 DIAGNOSIS — I10 Essential (primary) hypertension: Secondary | ICD-10-CM

## 2018-10-01 DIAGNOSIS — K219 Gastro-esophageal reflux disease without esophagitis: Secondary | ICD-10-CM

## 2018-10-01 DIAGNOSIS — E039 Hypothyroidism, unspecified: Secondary | ICD-10-CM

## 2018-10-01 DIAGNOSIS — E785 Hyperlipidemia, unspecified: Secondary | ICD-10-CM

## 2018-10-01 DIAGNOSIS — I482 Chronic atrial fibrillation, unspecified: Secondary | ICD-10-CM

## 2018-10-04 ENCOUNTER — Other Ambulatory Visit: Payer: Self-pay | Admitting: *Deleted

## 2018-10-04 DIAGNOSIS — F32A Depression, unspecified: Secondary | ICD-10-CM

## 2018-10-04 DIAGNOSIS — F329 Major depressive disorder, single episode, unspecified: Secondary | ICD-10-CM

## 2018-10-05 ENCOUNTER — Other Ambulatory Visit: Payer: Self-pay

## 2018-10-05 MED ORDER — FLUOXETINE HCL 20 MG PO TABS: 20.0000 mg | ORAL_TABLET | Freq: Every day | ORAL | 1 refills | Status: DC

## 2018-10-10 ENCOUNTER — Telehealth: Payer: Self-pay | Admitting: *Deleted

## 2018-10-10 NOTE — Telephone Encounter (Signed)
I agree

## 2018-10-10 NOTE — Telephone Encounter (Signed)
Call from pt requesting to schedule an appt . Stated she has been having night chills then nausea to hot sweats then back to cold chills. Denies fever, sob, cp but thinks her a fib might be "acting up". Also, c/o chronic back pain and depression. Stated she recently married but her daughters are not happy. [facility] appt scheduled on Wed 8/5.

## 2018-10-12 ENCOUNTER — Ambulatory Visit (INDEPENDENT_AMBULATORY_CARE_PROVIDER_SITE_OTHER): Payer: PPO | Admitting: Internal Medicine

## 2018-10-12 ENCOUNTER — Other Ambulatory Visit: Payer: Self-pay

## 2018-10-12 VITALS — BP 148/81 | HR 70 | Temp 98.0°F | Ht 63.0 in | Wt 172.3 lb

## 2018-10-12 DIAGNOSIS — F339 Major depressive disorder, recurrent, unspecified: Secondary | ICD-10-CM | POA: Diagnosis not present

## 2018-10-12 DIAGNOSIS — Z79899 Other long term (current) drug therapy: Secondary | ICD-10-CM

## 2018-10-12 DIAGNOSIS — F32A Depression, unspecified: Secondary | ICD-10-CM

## 2018-10-12 DIAGNOSIS — F329 Major depressive disorder, single episode, unspecified: Secondary | ICD-10-CM

## 2018-10-12 MED ORDER — TRAZODONE HCL 100 MG PO TABS: 100.0000 mg | ORAL_TABLET | Freq: Every day | ORAL | 1 refills | Status: DC

## 2018-10-12 NOTE — Patient Instructions (Addendum)
[name], It was nice seeing you. I'm sorry you are having such a difficult time with family stressors right now.  I am placing a referral to our behavioral health counselor which I think will be beneficial to help talk through things with you.  I am also sending in a prescription to help you get some sleep. We will keep the Prozac the same for now and follow up in a few weeks to see how you are doing.   The number for Dr. Junius Roads office is [phone] to follow-up.   Take care, Dr. Koleen Distance

## 2018-10-13 ENCOUNTER — Encounter: Payer: Self-pay | Admitting: Internal Medicine

## 2018-10-13 NOTE — Assessment & Plan Note (Signed)
Patient presents with acutely worsening depression. She recently got married, but her children have not been very supportive which has been quite difficult for her. She also unexpectedly lost her dog.  Endorses significant difficulty with sleep, feelings of guilt, increased tearfulness, and very little appetite.  Currently on Prozac 20 mg which she has been stable on for several years. I think she would benefit most from counseling services with Miquel Dunn.  Will also prescribe Trazodone qhs to help with sleep.  Follow-up in 4 weeks.

## 2018-10-13 NOTE — Progress Notes (Signed)
   CC: depression   HPI:  [name] is a 68 y.o. female with PMHx listed below who presents for acutely worsening depression.   Past Medical History:  Diagnosis Date  . Abnormal CT scan 03/16/2017  . Atrial fibrillation ([place])   . Bunion   . Carotid stenosis   . Carpal tunnel syndrome    mild   . Cervical arthritis   . Cholecystitis    s/p cholecystectomy  . Chronic back pain   . Chronic gastritis   . Depression   . Diabetic peripheral neuropathy ([place])   . Diarrhea 04/05/2015  . Digoxin toxicity    08/2011  . Diverticulitis    dx 04/08/14  . Diverticulosis   . Dysphagia    no documented strictures but responded positively to dilation in past.   . Epileptic seizure, tonic ([place])    .No meds since age of 67.  . Family history of adverse reaction to anesthesia    "my mother may have"  . Fatty liver   . Fibromyalgia   . GERD (gastroesophageal reflux disease)   . H/O hiatal hernia    S/P hernia repair  . Hyperlipidemia   . Hypertension   . Hypothyroid   . Internal hemorrhoid   . Joint pain 05/08/2011  . Left knee pain 12/19/2014  . Migraine   . Mild cognitive impairment   . Muscle spasm of back 08/10/2013  . OSA (obstructive sleep apnea) dx'd 2003   "can't sleep w/that mask; lost some weight; maybe that's helped" (04/26/2014)  . Osteoarthritis   . Pneumonia X 2  . Right foot pain 08/03/2013  . Sarcoidosis   . Skin neoplasm   . TIA (transient ischemic attack)    "don't know when I had it" (04/26/2014)  . Transaminase or LDH elevation   . Type II diabetes mellitus ([place])    Review of Systems:   Constitutional: negative for fevers, chills Neuro: negative for headaches, dizziness, LOC GI: positive for decreased appetite. Negative for n/v, changes in BMs Psych: positive for decreased sleep. Negative for SI or HI   Physical Exam:  Vitals:   10/12/18 1544  BP: (!) 148/81  Pulse: 70  Temp: 98 F (36.7 C)  TempSrc: Oral  SpO2: 100%  Weight: 172 lb 4.8 oz  (78.2 kg)  Height: 5\' 3"  (1.6 m)   Physical Exam Constitutional:      General: She is not in acute distress.    Appearance: Normal appearance.  Eyes:     Conjunctiva/sclera: Conjunctivae normal.  Pulmonary:     Effort: Pulmonary effort is normal.  Neurological:     General: No focal deficit present.     Mental Status: She is alert and oriented to person, place, and time.     Gait: Gait normal.  Psychiatric:        Attention and Perception: Attention and perception normal.        Mood and Affect: Mood is anxious.        Speech: Speech normal.        Behavior: Behavior normal.        Thought Content: Thought content normal. Thought content does not include suicidal ideation.        Cognition and Memory: Cognition and memory normal.        Judgment: Judgment normal.      Assessment & Plan:   See Encounters Tab for problem based charting.  Patient discussed with Dr. Lynnae January

## 2018-10-14 NOTE — Progress Notes (Signed)
Internal Medicine Clinic Attending  Case discussed with Dr. Bloomfield at the time of the visit.  We reviewed the resident's history and exam and pertinent patient test results.  I agree with the assessment, diagnosis, and plan of care documented in the resident's note.  

## 2018-10-17 ENCOUNTER — Other Ambulatory Visit: Payer: Self-pay

## 2018-10-17 ENCOUNTER — Ambulatory Visit: Payer: PPO | Admitting: Pharmacist

## 2018-10-17 DIAGNOSIS — I4891 Unspecified atrial fibrillation: Secondary | ICD-10-CM

## 2018-10-17 NOTE — Progress Notes (Signed)
Medication Samples have been provided to the patient.  Drug: Xarelto (rivaroxaban) Strength: 10mg  Qty: 42 (6 bottles x 7 pills) LOT: [id number] Exp.Date: 04/22 Dosing instructions: take 2 tablets by mouth daily  The patient has been instructed regarding the correct time, dose, and frequency of taking this medication, including desired effects and most common side effects. Patient advised to contact clinic if any questions arise and verbalized understanding by repeat back.  [employer] 2:27 PM 10/17/2018

## 2018-10-18 ENCOUNTER — Telehealth: Payer: Self-pay | Admitting: Internal Medicine

## 2018-10-18 ENCOUNTER — Telehealth: Payer: Self-pay | Admitting: *Deleted

## 2018-10-18 NOTE — Telephone Encounter (Signed)
Pt is having a problem with her sleeping medicine [phone] Pls contact

## 2018-10-18 NOTE — Telephone Encounter (Signed)
Pt calls and states that since starting trazodone she is "out of it", she feels like a "zombie". She is also emotionally distraught over continued family problems. She is ready to speak with Miquel Dunn, will send this note to [place] also Please advise

## 2018-10-18 NOTE — Telephone Encounter (Signed)
Called and spoke with patient regarding trazodone. Explained that she can try cutting pill in half or stopping altogether if she is not tolerating it. Advised she could try OTC melatonin, benadryl to help with sleep if she does not want to take Trazodone.

## 2018-10-18 NOTE — Telephone Encounter (Signed)
See encounter after this one.

## 2018-10-21 ENCOUNTER — Telehealth: Payer: Self-pay | Admitting: Licensed Clinical Social Worker

## 2018-10-21 NOTE — Telephone Encounter (Signed)
Pt was called due to a referral from her doctor (1st attempt). Pt did not answer, and a vm was left for the pt to contact our office to schedule a future appointment.

## 2018-10-26 ENCOUNTER — Telehealth: Payer: Self-pay | Admitting: Licensed Clinical Social Worker

## 2018-10-26 NOTE — Telephone Encounter (Signed)
Returned pt phone call. Informed pt she could come in tomorrow at 12:30, but would need to call our front office to schedule this appointment if that time frame worked for her.

## 2018-10-27 ENCOUNTER — Other Ambulatory Visit: Payer: Self-pay

## 2018-10-27 ENCOUNTER — Encounter: Payer: Self-pay | Admitting: Licensed Clinical Social Worker

## 2018-10-27 ENCOUNTER — Encounter: Payer: Self-pay | Admitting: Internal Medicine

## 2018-10-27 ENCOUNTER — Ambulatory Visit (INDEPENDENT_AMBULATORY_CARE_PROVIDER_SITE_OTHER): Payer: PPO | Admitting: Licensed Clinical Social Worker

## 2018-10-27 ENCOUNTER — Ambulatory Visit (INDEPENDENT_AMBULATORY_CARE_PROVIDER_SITE_OTHER): Payer: PPO | Admitting: Internal Medicine

## 2018-10-27 VITALS — BP 147/87 | HR 69 | Temp 99.0°F | Wt 178.0 lb

## 2018-10-27 DIAGNOSIS — Z79890 Hormone replacement therapy (postmenopausal): Secondary | ICD-10-CM | POA: Diagnosis not present

## 2018-10-27 DIAGNOSIS — M545 Low back pain, unspecified: Secondary | ICD-10-CM

## 2018-10-27 DIAGNOSIS — F4323 Adjustment disorder with mixed anxiety and depressed mood: Secondary | ICD-10-CM

## 2018-10-27 DIAGNOSIS — E119 Type 2 diabetes mellitus without complications: Secondary | ICD-10-CM

## 2018-10-27 DIAGNOSIS — E039 Hypothyroidism, unspecified: Secondary | ICD-10-CM

## 2018-10-27 DIAGNOSIS — E1121 Type 2 diabetes mellitus with diabetic nephropathy: Secondary | ICD-10-CM

## 2018-10-27 DIAGNOSIS — F339 Major depressive disorder, recurrent, unspecified: Secondary | ICD-10-CM | POA: Diagnosis not present

## 2018-10-27 DIAGNOSIS — F32A Depression, unspecified: Secondary | ICD-10-CM

## 2018-10-27 DIAGNOSIS — I10 Essential (primary) hypertension: Secondary | ICD-10-CM

## 2018-10-27 DIAGNOSIS — Z79899 Other long term (current) drug therapy: Secondary | ICD-10-CM

## 2018-10-27 DIAGNOSIS — G8929 Other chronic pain: Secondary | ICD-10-CM | POA: Diagnosis not present

## 2018-10-27 DIAGNOSIS — F329 Major depressive disorder, single episode, unspecified: Secondary | ICD-10-CM

## 2018-10-27 LAB — POCT GLYCOSYLATED HEMOGLOBIN (HGB A1C): Hemoglobin A1C: 6.3 % — AB (ref 4.0–5.6)

## 2018-10-27 LAB — GLUCOSE, CAPILLARY: Glucose-Capillary: 107 mg/dL — ABNORMAL HIGH (ref 70–99)

## 2018-10-27 MED ORDER — FLUOXETINE HCL 20 MG PO TABS: 40.0000 mg | ORAL_TABLET | Freq: Every day | ORAL | 1 refills | Status: DC

## 2018-10-27 MED ORDER — LOSARTAN POTASSIUM 25 MG PO TABS: 25.0000 mg | ORAL_TABLET | Freq: Every day | ORAL | 1 refills | Status: DC

## 2018-10-27 MED ORDER — DULOXETINE HCL 60 MG PO CPEP: 60.0000 mg | ORAL_CAPSULE | Freq: Every day | ORAL | 1 refills | Status: DC

## 2018-10-27 NOTE — Patient Instructions (Signed)
Thank you for allowing me to provide your care.   1) Call you orthopedic doctor to discuss injections.   2) Schedule an appointment with [name] and we will increase your Prozac to 40 mg once daily. Please call me in 4 weeks to let me know how your are doing.   3) Today we will check some blood work and I will let you know if we need to change anything.   Please come back in 6 months or sooner if anything arises.

## 2018-10-27 NOTE — BH Specialist Note (Signed)
Integrated Behavioral Health Initial Visit  MRN: [id number] Name: [name]  Number of [facility] Clinician visits:: 1/6 Session Start time: 12:30  Session End time: 1;05 Total time: 35 minutes  Type of Service: Integrated Behavioral Health- Individual Interpretor:No.          SUBJECTIVE: [name] is a 68 y.o. female  whom attended the session individually.  Patient was referred by Dr. Tarri Abernethy for . Patient reports the following symptoms/concerns: Depression Duration of problem: around three months; Severity of problem: moderate  OBJECTIVE: Mood: Depressed and Affect: Tearful Risk of harm to self or others: No plan to harm self or others  LIFE CONTEXT: Family and Social: Patient recently got married. Patient has two adult children whom disprove of her current relationship.  Self-Care: Needs improvement. Patient reported challenges with sleeping and lack of appetite.  Life Changes: Recently married and family strife.   GOALS ADDRESSED: Patient will: 1. Reduce symptoms of: anxiety, depression and stress 2. Increase knowledge and/or ability of: coping skills, healthy habits and stress reduction  3. Demonstrate ability to: Increase healthy adjustment to current life circumstances and Increase adequate support systems for patient/family  INTERVENTIONS: Interventions utilized: Brief CBT and Supportive Counseling  Standardized Assessments completed: assessed for SI, HI, and self-harm.  ASSESSMENT: Patient currently experiencing recent onset of depression and anxiety.  Patient reported that she began dating her now husband at the beginning of this calendar year. Patient's adult children are unhappy with the patient choosing to marry her husband. Patient is used to having a close relationship with her son and daughter, and now both children are not speaking with her. Patient reported chest pain, issues sleeping, lack of appetite, and crying  spells since the change in dynamics between her children and her.    Patient may benefit from outpatient counseling.  PLAN: 1. Follow up with behavioral health clinician on : one week.   Dessie Coma, [facility], [place]

## 2018-10-27 NOTE — Progress Notes (Signed)
   CC: HTN, back pain, and MDD  HPI:  [name] is a 68 y.o. female with PMHx listed below presenting for HTN, back pain, and MDD. Please see the A&P for the status of the patient's chronic medical problems.  Past Medical History:  Diagnosis Date  . Abnormal CT scan 03/16/2017  . Atrial fibrillation ([place])   . Bunion   . Carotid stenosis   . Carpal tunnel syndrome    mild   . Cervical arthritis   . Cholecystitis    s/p cholecystectomy  . Chronic back pain   . Chronic gastritis   . Depression   . Diabetic peripheral neuropathy ([place])   . Diarrhea 04/05/2015  . Digoxin toxicity    08/2011  . Diverticulitis    dx 04/08/14  . Diverticulosis   . Dysphagia    no documented strictures but responded positively to dilation in past.   . Epileptic seizure, tonic ([place])    .No meds since age of 37.  . Family history of adverse reaction to anesthesia    "my mother may have"  . Fatty liver   . Fibromyalgia   . GERD (gastroesophageal reflux disease)   . H/O hiatal hernia    S/P hernia repair  . Hyperlipidemia   . Hypertension   . Hypothyroid   . Internal hemorrhoid   . Joint pain 05/08/2011  . Left knee pain 12/19/2014  . Migraine   . Mild cognitive impairment   . Muscle spasm of back 08/10/2013  . Nonspecific (abnormal) findings on radiological and other examination of gastrointestinal tract 08/07/2011  . OSA (obstructive sleep apnea) dx'd 2003   "can't sleep w/that mask; lost some weight; maybe that's helped" (04/26/2014)  . Osteoarthritis   . Pneumonia X 2  . Right foot pain 08/03/2013  . Sarcoidosis   . Skin neoplasm   . TIA (transient ischemic attack)    "don't know when I had it" (04/26/2014)  . Transaminase or LDH elevation   . Type II diabetes mellitus ([place])   . Ventral hernia s/p laparoscopic lysis of adhesions and hernia repair with mesh 07/29/11 08/01/2011   Review of Systems:  Performed and all others negative.  Physical Exam: Vitals:   10/27/18 1313  BP:  (!) 147/87  Pulse: 69  Temp: 99 F (37.2 C)  TempSrc: Oral  SpO2: 99%  Weight: 178 lb (80.7 kg)   General: Obese female in no acute distress Pulm: Good air movement with no wheezing or crackles  CV: RRR, no murmurs, no rubs   Assessment & Plan:   See Encounters Tab for problem based charting.  Patient discussed with Dr. Evette Doffing

## 2018-10-28 ENCOUNTER — Encounter: Payer: Self-pay | Admitting: Internal Medicine

## 2018-10-28 LAB — TSH: TSH: 0.745 u[IU]/mL (ref 0.450–4.500)

## 2018-10-28 NOTE — Assessment & Plan Note (Signed)
Hx of hypothyroidism. Currently on levothyroxine 50 mcg QD. Has not had a TSH check in >1 year. Will recheck today. No hypo- or hyperthyroidism symptoms.

## 2018-10-28 NOTE — Assessment & Plan Note (Signed)
HPI: Chronic back pain due to facet arthropathy. She followed up with orthopedic surgery. Has done PT and used muscle relaxers without sustained relief.   A/P: - She will follow-up with orthopedic surgery  - Start duloxetine 60 mg

## 2018-10-28 NOTE — Assessment & Plan Note (Signed)
Well controlled DM. Managed with diet. She is not currently exercising.

## 2018-10-28 NOTE — Assessment & Plan Note (Signed)
HPI: Patient with MDD. PHQ-9 is 25 today. She is on Prozac 20 mg QD. Was prescribed Trazodone at her last visit but felt too groggy with it. She has met with Miquel Dunn since then. Found this very beneficial and will continue to follow with [facility]. She has a lot of stress with her family due to her recent marriage. Denies SI or HI.   A/P: - Change to Duloxetine 60 mg QD - Telehealth visit in 4 weeks.

## 2018-10-28 NOTE — Assessment & Plan Note (Signed)
HPI:  Patient with HTN. She is currently taking diltiazem 180 mg, Atenolol 25 mg, and losartan 12.5 mg QD. She is taking all her medications as prescribed. No issues affording them. No apparent side effects. No orthostatic symptoms.   A/P: - Continue Diltiazem 180 mg and Atenolol 25 mg (primarily for A-fib) - Increase losartan to 25 mg QD - BMP in 4 weeks.

## 2018-10-31 NOTE — Progress Notes (Signed)
Internal Medicine Clinic Attending  Case discussed with Dr. Helberg at the time of the visit.  We reviewed the resident's history and exam and pertinent patient test results.  I agree with the assessment, diagnosis, and plan of care documented in the resident's note.    

## 2018-11-03 ENCOUNTER — Ambulatory Visit: Payer: PPO | Admitting: Licensed Clinical Social Worker

## 2018-11-07 ENCOUNTER — Encounter: Payer: Self-pay | Admitting: Internal Medicine

## 2018-11-07 ENCOUNTER — Other Ambulatory Visit: Payer: Self-pay

## 2018-11-07 ENCOUNTER — Telehealth: Payer: Self-pay | Admitting: *Deleted

## 2018-11-07 ENCOUNTER — Ambulatory Visit (INDEPENDENT_AMBULATORY_CARE_PROVIDER_SITE_OTHER): Payer: PPO | Admitting: Internal Medicine

## 2018-11-07 DIAGNOSIS — R0602 Shortness of breath: Secondary | ICD-10-CM | POA: Diagnosis not present

## 2018-11-07 DIAGNOSIS — R6883 Chills (without fever): Secondary | ICD-10-CM | POA: Diagnosis not present

## 2018-11-07 DIAGNOSIS — R0981 Nasal congestion: Secondary | ICD-10-CM

## 2018-11-07 DIAGNOSIS — R51 Headache: Secondary | ICD-10-CM

## 2018-11-07 DIAGNOSIS — R05 Cough: Secondary | ICD-10-CM

## 2018-11-07 DIAGNOSIS — U071 COVID-19: Secondary | ICD-10-CM | POA: Insufficient documentation

## 2018-11-07 DIAGNOSIS — Z20822 Contact with and (suspected) exposure to covid-19: Secondary | ICD-10-CM

## 2018-11-07 NOTE — Telephone Encounter (Signed)
Would put her on for telehealth today or tomorrow.   Thanks!

## 2018-11-07 NOTE — Assessment & Plan Note (Signed)
Patient endorses productive cough and headaches that began 4 days ago. She also endorses nasal congestion, mild intermittent shortness of breath, and intermittent shortness of breath. She has had chills, but no fever. Denies n/v, abdominal pain or diarrhea. No known sick contacts.  Will send for COVID testing. She was assured she will be notified once we have results.  Patient advised to continue quarantining at home, but to seek emergent care if she develops any worsening  shortness of breath.

## 2018-11-07 NOTE — Progress Notes (Signed)
  [facility] Internal Medicine Residency Telephone Encounter Continuity Care Appointment  HPI:   This telephone encounter was created for [name] on 11/07/2018 for the following purpose/cc cough, headache    Past Medical History:  Past Medical History:  Diagnosis Date  . Abnormal CT scan 03/16/2017  . Atrial fibrillation ([place])   . Bunion   . Carotid stenosis   . Carpal tunnel syndrome    mild   . Cervical arthritis   . Cholecystitis    s/p cholecystectomy  . Chronic back pain   . Chronic gastritis   . Depression   . Diabetic peripheral neuropathy ([place])   . Diarrhea 04/05/2015  . Digoxin toxicity    08/2011  . Diverticulitis    dx 04/08/14  . Diverticulosis   . Dysphagia    no documented strictures but responded positively to dilation in past.   . Epileptic seizure, tonic ([place])    .No meds since age of 88.  . Family history of adverse reaction to anesthesia    "my mother may have"  . Fatty liver   . Fibromyalgia   . GERD (gastroesophageal reflux disease)   . H/O hiatal hernia    S/P hernia repair  . Hyperlipidemia   . Hypertension   . Hypothyroid   . Internal hemorrhoid   . Joint pain 05/08/2011  . Left knee pain 12/19/2014  . Migraine   . Mild cognitive impairment   . Muscle spasm of back 08/10/2013  . Nonspecific (abnormal) findings on radiological and other examination of gastrointestinal tract 08/07/2011  . OSA (obstructive sleep apnea) dx'd 2003   "can't sleep w/that mask; lost some weight; maybe that's helped" (04/26/2014)  . Osteoarthritis   . Pneumonia X 2  . Right foot pain 08/03/2013  . Sarcoidosis   . Skin neoplasm   . TIA (transient ischemic attack)    "don't know when I had it" (04/26/2014)  . Transaminase or LDH elevation   . Type II diabetes mellitus ([place])   . Ventral hernia s/p laparoscopic lysis of adhesions and hernia repair with mesh 07/29/11 08/01/2011      ROS:   Denies fevers, abdominal pain, n/v, diarrhea.    Assessment /  Plan / Recommendations:   Please see A&P under problem oriented charting for assessment of the patient's acute and chronic medical conditions.   As always, pt is advised that if symptoms worsen or new symptoms arise, they should go to an urgent care facility or to to ER for further evaluation.   Consent and Medical Decision Making:   Patient discussed with Dr. Daryll Drown  This is a telephone encounter between [name] and Delice Bison on 11/07/2018 for cough, headache. The visit was conducted with the patient located at home and Delice Bison at [facility]. The patient's identity was confirmed using their DOB and current address. The patient has consented to being evaluated through a telephone encounter and understands the associated risks (an examination cannot be done and the patient may need to come in for an appointment) / benefits (allows the patient to remain at home, decreasing exposure to coronavirus). I personally spent 15 minutes on medical discussion.

## 2018-11-07 NOTE — Telephone Encounter (Signed)
Call from pt - stated she has not been feeling well since Friday. Stated she has a cough "deep in my chest" but not coughing up much, sm amount of clear mucous; no fever,last temp was 97.0; no chest pain nor sob; she has decreased appetite but drinking plenty of water. She feels hot then cold.she's taking Mucinex dm and Theraflu. Should I schedule a telehealth appt or in person appt? Thanks

## 2018-11-07 NOTE — Telephone Encounter (Signed)
Telehealth appt scheduled for today @ 1415 PM; pt is agreeable. Instructed she will receive a call and have her phone close by.

## 2018-11-08 ENCOUNTER — Other Ambulatory Visit: Payer: Self-pay | Admitting: Emergency Medicine

## 2018-11-08 DIAGNOSIS — Z20822 Contact with and (suspected) exposure to covid-19: Secondary | ICD-10-CM

## 2018-11-09 ENCOUNTER — Telehealth: Payer: Self-pay | Admitting: *Deleted

## 2018-11-09 NOTE — Telephone Encounter (Signed)
Pt states she is no better. H/a unchanged Cough is deep, lots of yellow phlegm, "stuff coming out of tonsils, smells bad, taste bad, some is liquid" Achy Denies fever Feels horrible Taking 3 ex strength tylenol 3-4 times daily Please advise

## 2018-11-09 NOTE — Telephone Encounter (Signed)
Called the patient to discuss her symptoms. She was evaluated by Dr. Koleen Distance via a telephone encounter on 8/31 and received the COVID test on 9/1. We are awaiting these results. She has been using 1500 mg Acetaminophen 3-4x daily;y. We discussed the risk of toxicity and limiting her to 3000mg  per day. Discussed that a lot of OTC medication have acetaminophen hidden in them.   If her COVID test returned negative she may need to be evaluated to determine is she needs an antibiotic.

## 2018-11-09 NOTE — Telephone Encounter (Signed)
Called patient. Note in chart. Thank you.

## 2018-11-09 NOTE — Progress Notes (Signed)
Internal Medicine Clinic Attending  Case discussed with Dr. Koleen Distance at the time of the visit.  We reviewed the resident's history and telephone conversation and pertinent patient test results.  I agree with the assessment, diagnosis, and plan of care documented in the resident's note.

## 2018-11-10 ENCOUNTER — Encounter (HOSPITAL_COMMUNITY): Payer: Self-pay

## 2018-11-10 ENCOUNTER — Telehealth: Payer: Self-pay

## 2018-11-10 ENCOUNTER — Other Ambulatory Visit: Payer: Self-pay

## 2018-11-10 ENCOUNTER — Emergency Department (HOSPITAL_COMMUNITY): Payer: PPO

## 2018-11-10 ENCOUNTER — Emergency Department (HOSPITAL_COMMUNITY)
Admission: EM | Admit: 2018-11-10 | Discharge: 2018-11-10 | Disposition: A | Discharge status: Home / Self Care | Payer: PPO | Attending: Emergency Medicine | Admitting: Emergency Medicine

## 2018-11-10 DIAGNOSIS — E039 Hypothyroidism, unspecified: Secondary | ICD-10-CM | POA: Diagnosis not present

## 2018-11-10 DIAGNOSIS — R197 Diarrhea, unspecified: Secondary | ICD-10-CM | POA: Diagnosis not present

## 2018-11-10 DIAGNOSIS — R0602 Shortness of breath: Secondary | ICD-10-CM | POA: Diagnosis not present

## 2018-11-10 DIAGNOSIS — I10 Essential (primary) hypertension: Secondary | ICD-10-CM | POA: Insufficient documentation

## 2018-11-10 DIAGNOSIS — M791 Myalgia, unspecified site: Secondary | ICD-10-CM | POA: Diagnosis not present

## 2018-11-10 DIAGNOSIS — R05 Cough: Secondary | ICD-10-CM | POA: Diagnosis present

## 2018-11-10 DIAGNOSIS — R112 Nausea with vomiting, unspecified: Secondary | ICD-10-CM | POA: Insufficient documentation

## 2018-11-10 DIAGNOSIS — Z79899 Other long term (current) drug therapy: Secondary | ICD-10-CM | POA: Diagnosis not present

## 2018-11-10 DIAGNOSIS — E1140 Type 2 diabetes mellitus with diabetic neuropathy, unspecified: Secondary | ICD-10-CM | POA: Insufficient documentation

## 2018-11-10 DIAGNOSIS — Z91040 Latex allergy status: Secondary | ICD-10-CM | POA: Diagnosis not present

## 2018-11-10 DIAGNOSIS — J988 Other specified respiratory disorders: Secondary | ICD-10-CM | POA: Insufficient documentation

## 2018-11-10 DIAGNOSIS — U071 COVID-19: Secondary | ICD-10-CM

## 2018-11-10 DIAGNOSIS — Z85828 Personal history of other malignant neoplasm of skin: Secondary | ICD-10-CM | POA: Insufficient documentation

## 2018-11-10 DIAGNOSIS — Z8673 Personal history of transient ischemic attack (TIA), and cerebral infarction without residual deficits: Secondary | ICD-10-CM | POA: Diagnosis not present

## 2018-11-10 DIAGNOSIS — Z7901 Long term (current) use of anticoagulants: Secondary | ICD-10-CM | POA: Diagnosis not present

## 2018-11-10 LAB — COMPREHENSIVE METABOLIC PANEL
ALT: 32 U/L (ref 0–44)
AST: 34 U/L (ref 15–41)
Albumin: 3.9 g/dL (ref 3.5–5.0)
Alkaline Phosphatase: 80 U/L (ref 38–126)
Anion gap: 10 (ref 5–15)
BUN: 13 mg/dL (ref 8–23)
CO2: 26 mmol/L (ref 22–32)
Calcium: 9.9 mg/dL (ref 8.9–10.3)
Chloride: 102 mmol/L (ref 98–111)
Creatinine, Ser: 0.75 mg/dL (ref 0.44–1.00)
GFR calc Af Amer: >60 mL/min (ref >60)
GFR calc non Af Amer: >60 mL/min (ref >60)
Glucose, Bld: 145 mg/dL — ABNORMAL HIGH (ref 70–99)
Potassium: 4.3 mmol/L (ref 3.5–5.1)
Sodium: 138 mmol/L (ref 135–145)
Total Bilirubin: 1.1 mg/dL (ref 0.3–1.2)
Total Protein: 7.5 g/dL (ref 6.5–8.1)

## 2018-11-10 LAB — CBC WITH DIFFERENTIAL/PLATELET
Abs Immature Granulocytes: 0.01 10*3/uL (ref 0.00–0.07)
Basophils Absolute: 0 10*3/uL (ref 0.0–0.1)
Basophils Relative: 1 %
Eosinophils Absolute: 0.1 10*3/uL (ref 0.0–0.5)
Eosinophils Relative: 3 %
HCT: 45.3 % (ref 36.0–46.0)
Hemoglobin: 15.1 g/dL — ABNORMAL HIGH (ref 12.0–15.0)
Immature Granulocytes: 0 %
Lymphocytes Relative: 38 %
Lymphs Abs: 1.5 10*3/uL (ref 0.7–4.0)
MCH: 32.3 pg (ref 26.0–34.0)
MCHC: 33.3 g/dL (ref 30.0–36.0)
MCV: 96.8 fL (ref 80.0–100.0)
Monocytes Absolute: 0.3 10*3/uL (ref 0.1–1.0)
Monocytes Relative: 7 %
Neutro Abs: 2.1 10*3/uL (ref 1.7–7.7)
Neutrophils Relative %: 51 %
Platelets: 168 10*3/uL (ref 150–400)
RBC: 4.68 MIL/uL (ref 3.87–5.11)
RDW: 12.3 % (ref 11.5–15.5)
WBC: 4.1 10*3/uL (ref 4.0–10.5)
nRBC: 0 % (ref 0.0–0.2)

## 2018-11-10 LAB — PROTIME-INR
INR: 1.3 — ABNORMAL HIGH (ref 0.8–1.2)
Prothrombin Time: 16.1 seconds — ABNORMAL HIGH (ref 11.4–15.2)

## 2018-11-10 LAB — NOVEL CORONAVIRUS, NAA: SARS-CoV-2, NAA: DETECTED — AB

## 2018-11-10 LAB — APTT: aPTT: 32 seconds (ref 24–36)

## 2018-11-10 LAB — LACTIC ACID, PLASMA: Lactic Acid, Venous: 1.7 mmol/L (ref 0.5–1.9)

## 2018-11-10 MED ORDER — SODIUM CHLORIDE 0.9 % IV SOLN: 1000.0000 mL | INTRAVENOUS | Status: DC

## 2018-11-10 NOTE — ED Notes (Signed)
Pt ambulated in room.  

## 2018-11-10 NOTE — ED Provider Notes (Signed)
[facility] DEPT Provider Note   CSN: [id number] Arrival date & time: 11/10/18  1037     History   Chief Complaint Chief Complaint  Patient presents with  . COVID+    HPI [name] is a 68 y.o. female.     68yo female with history of persistent a-fib, on Xarelto, non-insulin-dependent diabetes, presents with complaint of reductive cough, mild shortness of breath, nausea, vomiting, diarrhea, body aches and feeling generally unwell.  Patient states her symptoms started on Friday 1 week ago, called her doctor on Monday and had a COVID test collected 2 days ago.  Patient was called today and informed her COVID test was positive, inform her doctor she did not feel well and was advised to come to the emergency room.  No other complaints or concerns.  Non-smoker, no history of asthma or chronic lung disease.  [name] was evaluated in Emergency Department on 11/10/2018 for the symptoms described in the history of present illness. She was evaluated in the context of the global COVID-19 pandemic, which necessitated consideration that the patient might be at risk for infection with the SARS-CoV-2 virus that causes COVID-19. Institutional protocols and algorithms that pertain to the evaluation of patients at risk for COVID-19 are in a state of rapid change based on information released by regulatory bodies including the CDC and federal and state organizations. These policies and algorithms were followed during the patient's care in the ED.      Past Medical History:  Diagnosis Date  . Abnormal CT scan 03/16/2017  . Atrial fibrillation ([place])   . Bunion   . Carotid stenosis   . Carpal tunnel syndrome    mild   . Cervical arthritis   . Cholecystitis    s/p cholecystectomy  . Chronic back pain   . Chronic gastritis   . Depression   . Diabetic peripheral neuropathy ([place])   . Diarrhea 04/05/2015  . Digoxin toxicity    08/2011  . Diverticulitis     dx 04/08/14  . Diverticulosis   . Dysphagia    no documented strictures but responded positively to dilation in past.   . Epileptic seizure, tonic ([place])    .No meds since age of 77.  . Family history of adverse reaction to anesthesia    "my mother may have"  . Fatty liver   . Fibromyalgia   . GERD (gastroesophageal reflux disease)   . H/O hiatal hernia    S/P hernia repair  . Hyperlipidemia   . Hypertension   . Hypothyroid   . Internal hemorrhoid   . Joint pain 05/08/2011  . Left knee pain 12/19/2014  . Migraine   . Mild cognitive impairment   . Muscle spasm of back 08/10/2013  . Nonspecific (abnormal) findings on radiological and other examination of gastrointestinal tract 08/07/2011  . OSA (obstructive sleep apnea) dx'd 2003   "can't sleep w/that mask; lost some weight; maybe that's helped" (04/26/2014)  . Osteoarthritis   . Pneumonia X 2  . Right foot pain 08/03/2013  . Sarcoidosis   . Skin neoplasm   . TIA (transient ischemic attack)    "don't know when I had it" (04/26/2014)  . Transaminase or LDH elevation   . Type II diabetes mellitus ([place])   . Ventral hernia s/p laparoscopic lysis of adhesions and hernia repair with mesh 07/29/11 08/01/2011    Patient Active Problem List   Diagnosis Date Noted  . Suspected Covid-19 Virus Infection 11/07/2018  .  Vitamin D deficiency 04/28/2014  . BPPV (benign paroxysmal positional vertigo) 04/26/2014  . Poor dentition 04/03/2013  . Healthcare maintenance 03/31/2013  . Abnormality of gait 07/25/2012  . Headache 02/25/2012  . Chronic back pain 10/07/2011  . Financial difficulties 10/07/2011  . Hemorrhoids 10/06/2011  . Anemia 10/02/2011  . Diverticulosis 10/02/2011  . Depression 05/09/2009  . OBESITY, UNSPECIFIED 11/06/2008  . Carotid stenosis 11/06/2008  . FIBROMYALGIA, SEVERE 09/14/2008  . GERD 09/12/2008  . Osteopenia 06/22/2008  . Hyperlipidemia 06/30/2007  . TIA 06/01/2007  . DIABETIC PERIPHERAL NEUROPATHY 04/05/2006  .  Diabetes type 2, controlled ([place]) 03/18/2006  . Hypothyroidism 12/21/2005  . Essential hypertension 12/21/2005  . Atrial fibrillation ([place]) 12/21/2005  . Osteoarthritis 12/21/2005    Past Surgical History:  Procedure Laterality Date  . ABDOMINAL HYSTERECTOMY  1980's   partial  . BILATERAL OOPHORECTOMY  1980's   "after partial hysterectomy"  . BREAST BIOPSY Right    benign  . BREAST EXCISIONAL BIOPSY Right 03/2006   benign  . BREAST SURGERY Right ~ 2010   excision milk duct;  . BUNIONECTOMY Bilateral   . CATARACT EXTRACTION W/PHACO  10/27/2010   Procedure: CATARACT EXTRACTION PHACO AND INTRAOCULAR LENS PLACEMENT (IOC);  Surgeon: Williams Che;  Location: [facility];  Service: Ophthalmology;  Laterality: Left;  CDE- 1.78  . CATARACT EXTRACTION W/PHACO  07/13/2011   Procedure: CATARACT EXTRACTION PHACO AND INTRAOCULAR LENS PLACEMENT (IOC);  Surgeon: Williams Che, MD;  Location: [facility];  Service: Ophthalmology;  Laterality: Right;  CDE:  1.65  . DILATION AND CURETTAGE OF UTERUS  1970; 1976  . ESOPHAGEAL DILATION  multiple  . FOOT SURGERY Left ~ 2011   "straightened toe & scraped bone below big toe"  . FOOT SURGERY Right ~ 2011   "scraped bone of big toe; shortened middle toet"  . HIATAL HERNIA REPAIR  1990   "had to have scar tissue removed 6 months after repair"  . Inverted nipples  1992  . LAPAROSCOPIC CHOLECYSTECTOMY  1980's  . LIPOSUCTION  1992  . SALPINGOOPHORECTOMY Bilateral    "6 months or so after hysterectomy"  . SKIN CANCER EXCISION  1990's   "front side of right shin"  . TUBAL LIGATION  1976  . VENTRAL HERNIA REPAIR  07/29/2011   Procedure: LAPAROSCOPIC VENTRAL HERNIA;  Surgeon: Adin Hector, MD;  Location: [facility];  Service: General;  Laterality: N/A;  multiple incarcerated hernias with mesh     OB History   No obstetric history on file.      Home Medications    Prior to Admission medications   Medication Sig Start Date End Date Taking? Authorizing  Provider  atenolol (TENORMIN) 25 MG tablet TAKE 1 AND 1/2 TABLET TWICE A DAY 10/04/18   Ina Homes, MD  baclofen (LIORESAL) 10 MG tablet Take 0.5-1 tablets (5-10 mg total) by mouth 3 (three) times daily as needed for muscle spasms. 07/12/18   Hilts, Legrand Como, MD  Cholecalciferol (VITAMIN D3 PO) Take 5,000 Int'l Units by mouth daily.    [provider]  diltiazem (CARDIZEM CD) 180 MG 24 hr capsule TAKE ONE (1) CAPSULE EACH DAY 10/04/18   Helberg, Larkin Ina, MD  DULoxetine (CYMBALTA) 60 MG capsule Take 1 capsule (60 mg total) by mouth daily. 10/27/18   Ina Homes, MD  famotidine (PEPCID) 20 MG tablet TAKE ONE (1) TABLET EACH DAY 10/04/18   Ina Homes, MD  levothyroxine (SYNTHROID) 50 MCG tablet TAKE ONE TABLET EVERY MORNING 10/04/18   Helberg, Larkin Ina,  MD  losartan (COZAAR) 25 MG tablet Take 1 tablet (25 mg total) by mouth daily. 10/27/18   Ina Homes, MD  Multiple Vitamin (MULTIVITAMIN) tablet Take 1 tablet by mouth daily.    [provider]  ondansetron (ZOFRAN-ODT) 4 MG disintegrating tablet Take 1 tablet (4 mg total) by mouth every 8 (eight) hours as needed for nausea. 05/02/18   Melvenia Beam, MD  rivaroxaban (XARELTO) 20 MG TABS tablet TAKE 1 TABLET DAILY WITH SUPPER 09/08/18   Ina Homes, MD  rosuvastatin (CRESTOR) 20 MG tablet TAKE ONE (1) TABLET EACH DAY 10/04/18   Ina Homes, MD    Family History Family History  Problem Relation Age of Onset  . Crohn's disease Mother   . Colitis Mother        Crohns  . Anesthesia problems Mother   . Arthritis Mother        rheumatoid; maternal side of family also with RA  . Pneumonia Mother   . Diabetes Father   . Heart disease Father   . Melanoma Father   . Coronary artery disease Father   . Skin cancer Father   . Stroke Father   . Diabetes Sister   . Migraines Sister   . Depression Maternal Uncle   . Dementia Maternal Uncle   . Colon cancer Paternal Uncle        ? if stomach or colon   . Hypotension Neg  Hx   . Malignant hyperthermia Neg Hx   . Pseudochol deficiency Neg Hx     Social History Social History   Tobacco Use  . Smoking status: Never Smoker  . Smokeless tobacco: Never Used  Substance Use Topics  . Alcohol use: Never    Alcohol/week: 0.0 standard drinks    Frequency: Never  . Drug use: Never     Allergies   Gemfibrozil, Latex, Penicillins, Adhesive [tape], Codeine, Digoxin and related, Metformin and related, Omeprazole, and Ace inhibitors   Review of Systems Review of Systems  Constitutional: Negative for fever.  HENT: Negative for congestion.   Respiratory: Positive for cough and shortness of breath.   Cardiovascular: Negative for chest pain.  Gastrointestinal: Positive for nausea and vomiting. Negative for abdominal pain.  Genitourinary: Negative for difficulty urinating and dysuria.  Musculoskeletal: Positive for arthralgias and myalgias.  Skin: Negative for rash and wound.  Allergic/Immunologic: Positive for immunocompromised state.  Neurological: Positive for weakness.     Physical Exam Updated Vital Signs BP (!) 127/24   Pulse (!) 34   Temp 98.5 F (36.9 C) (Oral)   Resp 17   LMP 06/03/1978   SpO2 93%   Physical Exam Vitals signs and nursing note reviewed.  Constitutional:      General: She is not in acute distress.    Appearance: She is well-developed. She is not diaphoretic.  HENT:     Head: Normocephalic and atraumatic.  Cardiovascular:     Rate and Rhythm: Normal rate and regular rhythm.     Pulses: Normal pulses.     Heart sounds: Normal heart sounds.  Pulmonary:     Effort: Pulmonary effort is normal.     Breath sounds: Normal breath sounds.  Abdominal:     Palpations: Abdomen is soft.     Tenderness: There is no abdominal tenderness.  Skin:    General: Skin is warm and dry.     Findings: No erythema or rash.  Neurological:     Mental Status: She is alert and oriented to  person, place, and time.  Psychiatric:         Behavior: Behavior normal.      ED Treatments / Results  Labs (all labs ordered are listed, but only abnormal results are displayed) Labs Reviewed  COMPREHENSIVE METABOLIC PANEL - Abnormal; Notable for the following components:      Result Value   Glucose, Bld 145 (*)    All other components within normal limits  CBC WITH DIFFERENTIAL/PLATELET - Abnormal; Notable for the following components:   Hemoglobin 15.1 (*)    All other components within normal limits  PROTIME-INR - Abnormal; Notable for the following components:   Prothrombin Time 16.1 (*)    INR 1.3 (*)    All other components within normal limits  CULTURE, BLOOD (ROUTINE X 2)  CULTURE, BLOOD (ROUTINE X 2)  URINE CULTURE  LACTIC ACID, PLASMA  APTT  LACTIC ACID, PLASMA  URINALYSIS, ROUTINE W REFLEX MICROSCOPIC    EKG EKG Interpretation  Date/Time:  Thursday November 10 2018 11:45:42 EDT Ventricular Rate:  76 PR Interval:    QRS Duration: 131 QT Interval:  440 QTC Calculation: 495 R Axis:     Text Interpretation:  Atrial fibrillation Left bundle branch block When compared with ECG of 04/20/2014 No significant change was found Confirmed by Francine Graven [phone]) on 11/10/2018 12:43:22 PM   Radiology Dg Chest Port 1 View  Result Date: 11/10/2018 CLINICAL DATA:  COVID-19, shortness of breath EXAM: PORTABLE CHEST 1 VIEW COMPARISON:  None. FINDINGS: Mild cardiomegaly. Both lungs are clear. The visualized skeletal structures are unremarkable. IMPRESSION: No acute abnormality of the lungs.  No acute airspace opacity. Electronically Signed   By: Eddie Candle M.D.   On: 11/10/2018 12:47    Procedures Procedures (including critical care time)  Medications Ordered in ED Medications  0.9 %  sodium chloride infusion (has no administration in time range)     Initial Impression / Assessment and Plan / ED Course  I have reviewed the triage vital signs and the nursing notes.  Pertinent labs & imaging results that  were available during my care of the patient were reviewed by me and considered in my medical decision making (see chart for details).  Clinical Course as of Nov 10 1531  Thu Nov 09, 4236  4618 68 year old female presents emergency room with complaint of shortness of breath.  Patient has been sick for the past week, went to her doctor earlier this week and was contacted today and told her COVID test was positive.  On exam patient looks to be feeling unwell although room air sats are good while resting.  Patient becomes tachypneic and desats to 93% on room air with any exertion around her room.  Chest x-ray is unremarkable, CBC and CMP without significant changes.  Lactic acid x1 normal at 1.7.  EKG shows known A. Fib. Discussed results with patient who states she is afraid to go home, feels too weak and short of breath with any exertion.  Case discussed with Dr. Thurnell Garbe, ER attending, agrees with plan to consult hospitalist for possible admission.   [LM]  1531 Call to Dr. Cyndia Skeeters with [facility], patient does not meet admission criteria, recommend close follow up with PCP, home pulse ox monitoring, return to ER for worsening symptoms.    [LM]    Clinical Course User Index [LM] Tacy Learn, PA-C      Final Clinical Impressions(s) / ED Diagnoses   Final diagnoses:  None  ED Discharge Orders    None       Tacy Learn, PA-C 11/10/18 [place], [place], DO 11/14/18 [phone]

## 2018-11-10 NOTE — ED Triage Notes (Signed)
Pt c/o SOB and cough since Friday 8/28. Pt tested positive for COVID at [facility]. Pt states "I am not getting any better".

## 2018-11-10 NOTE — Discharge Instructions (Addendum)
Close follow up with your doctor's office. Return to the ER for any worsening or concerning symptoms.  Your chest x-ray today looks good and your labs are reassuring. Your oxygen and vitals are all normal.

## 2018-11-10 NOTE — Telephone Encounter (Signed)
Pt's husband called, stating patient just got tested positive for COVID-19. States Pt is not doing well, no energy, feeling very weak and still coughing up yellow mucus.  Please call back.

## 2018-11-10 NOTE — ED Notes (Signed)
Pure wick has been placed. Suction set to 45mmHg.  

## 2018-11-10 NOTE — ED Notes (Signed)
An After Visit Summary was printed and given to the patient. Discharge instructions given and no further questions at this time.  

## 2018-11-10 NOTE — Telephone Encounter (Signed)
Husband notified to take patient directly to [facility] ED per Attending, Dr. Rebeca Alert. Hubbard Hartshorn, RN, BSN

## 2018-11-10 NOTE — ED Notes (Signed)
Pt ambulated in room O2 at 97% RA.

## 2018-11-12 ENCOUNTER — Telehealth: Payer: Self-pay | Admitting: Internal Medicine

## 2018-11-12 DIAGNOSIS — F4321 Adjustment disorder with depressed mood: Secondary | ICD-10-CM

## 2018-11-12 MED ORDER — LORAZEPAM 0.5 MG PO TABS: 0.5000 mg | ORAL_TABLET | Freq: Two times a day (BID) | ORAL | 0 refills | Status: DC | PRN

## 2018-11-12 NOTE — Telephone Encounter (Signed)
Patient's husband contacted [facility] regarding [name].  He reports that a few days ago patient was diagnosed with COVID-19 and this morning they found out her 70 year old daughter passed away.  He is concerned that she will "lose it" given her reaction to the news.  Patient has been shaking and crying inconsolably, she has been crying out to God for help.  Patient does have history of depression and takes duloxetine daily.  Patient denies any current SI.  The husband is a [employer] park manager and has been helping with counseling his wife.  He reports that she is not getting better and requesting to see if any medications may be available.  Although it is not recommended to prescribe benzodiazepines in the setting of acute grief reaction given the significant concern of the husband, the inconsolable nature of her anxiety, and current circumstances will give a low dose of Ativan as needed help.  Advised husband and patient that this is a short course get her through this episode and to watch out for side effects of the medication.   -Advised patient to seek out counseling -Ativan 0.5 mg every 12 as needed x5 tabs

## 2018-11-15 ENCOUNTER — Telehealth: Payer: Self-pay | Admitting: Internal Medicine

## 2018-11-15 ENCOUNTER — Telehealth: Payer: Self-pay | Admitting: *Deleted

## 2018-11-15 LAB — CULTURE, BLOOD (ROUTINE X 2)
Culture: NO GROWTH
Culture: NO GROWTH
Special Requests: ADEQUATE
Special Requests: ADEQUATE

## 2018-11-15 NOTE — Telephone Encounter (Signed)
Please call [name] calling to notify office that they will be supporting care and coordination provided by her insurance.  Please call their office if any questions.

## 2018-11-15 NOTE — Telephone Encounter (Signed)
[name], pt needs xarelto, do I give her some out of stock or do you have some set aside for her? [name], pt's daughter passed away suddenly Dec 09, 2022, pt tested +POSITIVE+ for COVID 9/1 so she is in quarantine for 10 days and will not attend the service this 07-04-22, she is VERY distraught, could you call her, I did not put her on your schedule advise when and I will Dr Tarri Abernethy and attending, as stated above pt's daughter died suddenly 04-Jul-2022 and she is completely distraught, she states she has 1 more lorazepam and needs a refill, please advise

## 2018-11-15 NOTE — Telephone Encounter (Signed)
NP just wanted to let the MD know pt is going thru grief of daughter passing away and that pt may need refill of anxiety med, sending telehealth message to front desk

## 2018-11-15 NOTE — Telephone Encounter (Signed)
rtc lm for rtc 

## 2018-11-15 NOTE — Telephone Encounter (Signed)
Please have patient schedule a telehealth appointment for ativan refill. Thank you.

## 2018-11-16 NOTE — Telephone Encounter (Signed)
Has appt with [facility] on 12/01/2018. Hubbard Hartshorn, RN, BSN

## 2018-11-17 ENCOUNTER — Emergency Department (HOSPITAL_COMMUNITY)
Admission: EM | Admit: 2018-11-17 | Discharge: 2018-11-17 | Disposition: A | Discharge status: Home / Self Care | Payer: PPO | Attending: Emergency Medicine | Admitting: Emergency Medicine

## 2018-11-17 ENCOUNTER — Other Ambulatory Visit: Payer: Self-pay

## 2018-11-17 ENCOUNTER — Emergency Department (HOSPITAL_COMMUNITY): Payer: PPO

## 2018-11-17 ENCOUNTER — Ambulatory Visit (INDEPENDENT_AMBULATORY_CARE_PROVIDER_SITE_OTHER): Payer: PPO | Admitting: Internal Medicine

## 2018-11-17 DIAGNOSIS — R112 Nausea with vomiting, unspecified: Secondary | ICD-10-CM | POA: Diagnosis not present

## 2018-11-17 DIAGNOSIS — J984 Other disorders of lung: Secondary | ICD-10-CM | POA: Diagnosis not present

## 2018-11-17 DIAGNOSIS — J181 Lobar pneumonia, unspecified organism: Secondary | ICD-10-CM | POA: Diagnosis not present

## 2018-11-17 DIAGNOSIS — Z79899 Other long term (current) drug therapy: Secondary | ICD-10-CM | POA: Diagnosis not present

## 2018-11-17 DIAGNOSIS — R531 Weakness: Secondary | ICD-10-CM | POA: Diagnosis present

## 2018-11-17 DIAGNOSIS — Z8673 Personal history of transient ischemic attack (TIA), and cerebral infarction without residual deficits: Secondary | ICD-10-CM | POA: Insufficient documentation

## 2018-11-17 DIAGNOSIS — I10 Essential (primary) hypertension: Secondary | ICD-10-CM | POA: Insufficient documentation

## 2018-11-17 DIAGNOSIS — Z91040 Latex allergy status: Secondary | ICD-10-CM | POA: Insufficient documentation

## 2018-11-17 DIAGNOSIS — E039 Hypothyroidism, unspecified: Secondary | ICD-10-CM | POA: Insufficient documentation

## 2018-11-17 DIAGNOSIS — J189 Pneumonia, unspecified organism: Secondary | ICD-10-CM | POA: Diagnosis not present

## 2018-11-17 DIAGNOSIS — E119 Type 2 diabetes mellitus without complications: Secondary | ICD-10-CM | POA: Diagnosis not present

## 2018-11-17 DIAGNOSIS — U071 COVID-19: Secondary | ICD-10-CM

## 2018-11-17 LAB — COMPREHENSIVE METABOLIC PANEL
ALT: 21 U/L (ref 0–44)
AST: 23 U/L (ref 15–41)
Albumin: 3.1 g/dL — ABNORMAL LOW (ref 3.5–5.0)
Alkaline Phosphatase: 67 U/L (ref 38–126)
Anion gap: 11 (ref 5–15)
BUN: 18 mg/dL (ref 8–23)
CO2: 25 mmol/L (ref 22–32)
Calcium: 9.2 mg/dL (ref 8.9–10.3)
Chloride: 101 mmol/L (ref 98–111)
Creatinine, Ser: 0.89 mg/dL (ref 0.44–1.00)
GFR calc Af Amer: >60 mL/min (ref >60)
GFR calc non Af Amer: >60 mL/min (ref >60)
Glucose, Bld: 137 mg/dL — ABNORMAL HIGH (ref 70–99)
Potassium: 3.3 mmol/L — ABNORMAL LOW (ref 3.5–5.1)
Sodium: 137 mmol/L (ref 135–145)
Total Bilirubin: 0.9 mg/dL (ref 0.3–1.2)
Total Protein: 6.7 g/dL (ref 6.5–8.1)

## 2018-11-17 LAB — CBC WITH DIFFERENTIAL/PLATELET
Abs Immature Granulocytes: 0.02 10*3/uL (ref 0.00–0.07)
Basophils Absolute: 0 10*3/uL (ref 0.0–0.1)
Basophils Relative: 0 %
Eosinophils Absolute: 0.1 10*3/uL (ref 0.0–0.5)
Eosinophils Relative: 1 %
HCT: 39 % (ref 36.0–46.0)
Hemoglobin: 13.3 g/dL (ref 12.0–15.0)
Immature Granulocytes: 0 %
Lymphocytes Relative: 30 %
Lymphs Abs: 1.9 10*3/uL (ref 0.7–4.0)
MCH: 33.4 pg (ref 26.0–34.0)
MCHC: 34.1 g/dL (ref 30.0–36.0)
MCV: 98 fL (ref 80.0–100.0)
Monocytes Absolute: 0.5 10*3/uL (ref 0.1–1.0)
Monocytes Relative: 8 %
Neutro Abs: 3.9 10*3/uL (ref 1.7–7.7)
Neutrophils Relative %: 61 %
Platelets: 243 10*3/uL (ref 150–400)
RBC: 3.98 MIL/uL (ref 3.87–5.11)
RDW: 11.7 % (ref 11.5–15.5)
WBC: 6.4 10*3/uL (ref 4.0–10.5)
nRBC: 0 % (ref 0.0–0.2)

## 2018-11-17 LAB — LACTIC ACID, PLASMA: Lactic Acid, Venous: 1.3 mmol/L (ref 0.5–1.9)

## 2018-11-17 MED ORDER — ONDANSETRON HCL 4 MG/2ML IJ SOLN
4.0000 mg | Freq: Once | INTRAMUSCULAR | Status: AC
  Administered 2018-11-17: 4 mg via INTRAVENOUS
  Filled 2018-11-17: qty 2

## 2018-11-17 MED ORDER — SODIUM CHLORIDE 0.9 % IV BOLUS
1000.0000 mL | Freq: Once | INTRAVENOUS | Status: AC
  Administered 2018-11-17: 1000 mL via INTRAVENOUS

## 2018-11-17 MED ORDER — ONDANSETRON HCL 4 MG PO TABS: 4.0000 mg | ORAL_TABLET | Freq: Four times a day (QID) | ORAL | 0 refills | Status: DC | PRN

## 2018-11-17 NOTE — Progress Notes (Signed)
Internal Medicine Clinic Attending  Case discussed with Dr. Chundi at the time of the visit.  We reviewed the resident's history and exam and pertinent patient test results.  I agree with the assessment, diagnosis, and plan of care documented in the resident's note. 

## 2018-11-17 NOTE — Telephone Encounter (Signed)
Sorry about that, [name]! I thought patient was planning on picking up Xarelto samples tomorrow, but feel free to provide her with some out of the med room stock. I will be in clinic tomorrow if she prefers to come in tomorrow.  Thank you

## 2018-11-17 NOTE — Discharge Instructions (Signed)
Person Under Monitoring Name: [name]  Location: [address] [place] [address]   Infection Prevention Recommendations for Individuals Confirmed to have, or Being Evaluated for, 2019 Novel Coronavirus (COVID-19) Infection Who Receive Care at Home  Individuals who are confirmed to have, or are being evaluated for, COVID-19 should follow the prevention steps below until a healthcare provider or local or state health department says they can return to normal activities.  Stay home except to get medical care You should restrict activities outside your home, except for getting medical care. Do not go to work, school, or public areas, and do not use public transportation or taxis.  Call ahead before visiting your doctor Before your medical appointment, call the healthcare provider and tell them that you have, or are being evaluated for, COVID-19 infection. This will help the healthcare providers office take steps to keep other people from getting infected. Ask your healthcare provider to call the local or state health department.  Monitor your symptoms Seek prompt medical attention if your illness is worsening (e.g., difficulty breathing). Before going to your medical appointment, call the healthcare provider and tell them that you have, or are being evaluated for, COVID-19 infection. Ask your healthcare provider to call the local or state health department.  Wear a facemask You should wear a facemask that covers your nose and mouth when you are in the same room with other people and when you visit a healthcare provider. People who live with or visit you should also wear a facemask while they are in the same room with you.  Separate yourself from other people in your home As much as possible, you should stay in a different room from other people in your home. Also, you should use a separate bathroom, if available.  Avoid sharing household items You  should not share dishes, drinking glasses, cups, eating utensils, towels, bedding, or other items with other people in your home. After using these items, you should wash them thoroughly with soap and water.  Cover your coughs and sneezes Cover your mouth and nose with a tissue when you cough or sneeze, or you can cough or sneeze into your sleeve. Throw used tissues in a lined trash can, and immediately wash your hands with soap and water for at least 20 seconds or use an alcohol-based hand rub.  Wash your [facility] your hands often and thoroughly with soap and water for at least 20 seconds. You can use an alcohol-based hand sanitizer if soap and water are not available and if your hands are not visibly dirty. Avoid touching your eyes, nose, and mouth with unwashed hands.   Prevention Steps for Caregivers and Household Members of Individuals Confirmed to have, or Being Evaluated for, COVID-19 Infection Being Cared for in the Home  If you live with, or provide care at home for, a person confirmed to have, or being evaluated for, COVID-19 infection please follow these guidelines to prevent infection:  Follow healthcare providers instructions Make sure that you understand and can help the patient follow any healthcare provider instructions for all care.  Provide for the patients basic needs You should help the patient with basic needs in the home and provide support for getting groceries, prescriptions, and other personal needs.  Monitor the patients symptoms If they are getting sicker, call his or her medical provider and tell them that the patient has, or is being evaluated for, COVID-19 infection. This will help the healthcare  providers office take steps to keep other people from getting infected. Ask the healthcare provider to call the local or state health department.  Limit the number of people who have contact with the patient If possible, have only one caregiver for the  patient. Other household members should stay in another home or place of residence. If this is not possible, they should stay in another room, or be separated from the patient as much as possible. Use a separate bathroom, if available. Restrict visitors who do not have an essential need to be in the home.  Keep older adults, very young children, and other sick people away from the patient Keep older adults, very young children, and those who have compromised immune systems or chronic health conditions away from the patient. This includes people with chronic heart, lung, or kidney conditions, diabetes, and cancer.  Ensure good ventilation Make sure that shared spaces in the home have good air flow, such as from an air conditioner or an opened window, weather permitting.  Wash your hands often Wash your hands often and thoroughly with soap and water for at least 20 seconds. You can use an alcohol based hand sanitizer if soap and water are not available and if your hands are not visibly dirty. Avoid touching your eyes, nose, and mouth with unwashed hands. Use disposable paper towels to dry your hands. If not available, use dedicated cloth towels and replace them when they become wet.  Wear a facemask and gloves Wear a disposable facemask at all times in the room and gloves when you touch or have contact with the patients blood, body fluids, and/or secretions or excretions, such as sweat, saliva, sputum, nasal mucus, vomit, urine, or feces.  Ensure the mask fits over your nose and mouth tightly, and do not touch it during use. Throw out disposable facemasks and gloves after using them. Do not reuse. Wash your hands immediately after removing your facemask and gloves. If your personal clothing becomes contaminated, carefully remove clothing and launder. Wash your hands after handling contaminated clothing. Place all used disposable facemasks, gloves, and other waste in a lined container before  disposing them with other household waste. Remove gloves and wash your hands immediately after handling these items.  Do not share dishes, glasses, or other household items with the patient Avoid sharing household items. You should not share dishes, drinking glasses, cups, eating utensils, towels, bedding, or other items with a patient who is confirmed to have, or being evaluated for, COVID-19 infection. After the person uses these items, you should wash them thoroughly with soap and water.  Wash laundry thoroughly Immediately remove and wash clothes or bedding that have blood, body fluids, and/or secretions or excretions, such as sweat, saliva, sputum, nasal mucus, vomit, urine, or feces, on them. Wear gloves when handling laundry from the patient. Read and follow directions on labels of laundry or clothing items and detergent. In general, wash and dry with the warmest temperatures recommended on the label.  Clean all areas the individual has used often Clean all touchable surfaces, such as counters, tabletops, doorknobs, bathroom fixtures, toilets, phones, keyboards, tablets, and bedside tables, every day. Also, clean any surfaces that may have blood, body fluids, and/or secretions or excretions on them. Wear gloves when cleaning surfaces the patient has come in contact with. Use a diluted bleach solution (e.g., dilute bleach with 1 part bleach and 10 parts water) or a household disinfectant with a label that says EPA-registered for coronaviruses. To make  a bleach solution at home, add 1 tablespoon of bleach to 1 quart (4 cups) of water. For a larger supply, add  cup of bleach to 1 gallon (16 cups) of water. Read labels of cleaning products and follow recommendations provided on product labels. Labels contain instructions for safe and effective use of the cleaning product including precautions you should take when applying the product, such as wearing gloves or eye protection and making sure you  have good ventilation during use of the product. Remove gloves and wash hands immediately after cleaning.  Monitor yourself for signs and symptoms of illness Caregivers and household members are considered close contacts, should monitor their health, and will be asked to limit movement outside of the home to the extent possible. Follow the monitoring steps for close contacts listed on the symptom monitoring form.   ? If you have additional questions, contact your local health department or call the epidemiologist on call at [phone] (available 24/7). ? This guidance is subject to change. For the most up-to-date guidance from [facility], please refer to their website: [email or web address]

## 2018-11-17 NOTE — ED Triage Notes (Signed)
Patient c/o not feeling any better, fatigue, and coughing. Tested positive for covid last week. Reports taking tylenol for fevers and staying hydrated.

## 2018-11-17 NOTE — Progress Notes (Signed)
  [facility] Residency Telephone Encounter Continuity Care Appointment  HPI:   This telephone encounter was created for [name] on 11/17/2018 for the following purpose/cc worsening upper respiratory symptoms.   Past Medical History:  Past Medical History:  Diagnosis Date  . Abnormal CT scan 03/16/2017  . Atrial fibrillation ([place])   . Bunion   . Carotid stenosis   . Carpal tunnel syndrome    mild   . Cervical arthritis   . Cholecystitis    s/p cholecystectomy  . Chronic back pain   . Chronic gastritis   . Depression   . Diabetic peripheral neuropathy ([place])   . Diarrhea 04/05/2015  . Digoxin toxicity    08/2011  . Diverticulitis    dx 04/08/14  . Diverticulosis   . Dysphagia    no documented strictures but responded positively to dilation in past.   . Epileptic seizure, tonic ([place])    .No meds since age of 62.  . Family history of adverse reaction to anesthesia    "my mother may have"  . Fatty liver   . Fibromyalgia   . GERD (gastroesophageal reflux disease)   . H/O hiatal hernia    S/P hernia repair  . Hyperlipidemia   . Hypertension   . Hypothyroid   . Internal hemorrhoid   . Joint pain 05/08/2011  . Left knee pain 12/19/2014  . Migraine   . Mild cognitive impairment   . Muscle spasm of back 08/10/2013  . Nonspecific (abnormal) findings on radiological and other examination of gastrointestinal tract 08/07/2011  . OSA (obstructive sleep apnea) dx'd 2003   "can't sleep w/that mask; lost some weight; maybe that's helped" (04/26/2014)  . Osteoarthritis   . Pneumonia X 2  . Right foot pain 08/03/2013  . Sarcoidosis   . Skin neoplasm   . TIA (transient ischemic attack)    "don't know when I had it" (04/26/2014)  . Transaminase or LDH elevation   . Type II diabetes mellitus ([place])   . Ventral hernia s/p laparoscopic lysis of adhesions and hernia repair with mesh 07/29/11 08/01/2011      ROS:  Review of Systems  Constitutional: Positive for  fever, malaise/fatigue and weight loss. Negative for chills.  Respiratory: Positive for cough, sputum production and shortness of breath.   Gastrointestinal: Positive for nausea. Negative for vomiting.  Neurological: Negative for dizziness and headaches.     Assessment / Plan / Recommendations:   Please see A&P under problem oriented charting for assessment of the patient's acute and chronic medical conditions.   As always, pt is advised that if symptoms worsen or new symptoms arise, they should go to an urgent care facility or to to ER for further evaluation.   Consent and Medical Decision Making:   Patient discussed with Dr. Philipp Ovens  This is a telephone encounter between [name] and   on 11/17/2018 for upper respiratory symptoms/covid19 postive. The visit was conducted with the patient located at home and   at [facility]. The patient's identity was confirmed using their DOB and current address. The patient has consented to being evaluated through a telephone encounter and understands the associated risks (an examination cannot be done and the patient may need to come in for an appointment) / benefits (allows the patient to remain at home, decreasing exposure to coronavirus). I personally spent 15 minutes on medical discussion.

## 2018-11-17 NOTE — ED Provider Notes (Signed)
[facility] EMERGENCY DEPARTMENT Provider Note   CSN: [id number] Arrival date & time: 11/17/18  1422     History   Chief Complaint Chief Complaint  Patient presents with  . Covid +    HPI [name] is a 68 y.o. female with a past medical history of chronic atrial fibrillation, carotid stenosis, fibromyalgia, diabetes, peripheral neuropathy, fatty liver, hyperlipidemia, hypertension who presents the emergency department with chief complaint of weakness.  She is currently infected with COVID-19.  Patient began having symptoms on November 04, 2018 after visiting a beach near [place].  She was diagnosed on 10/10/2018.  Patient states that she was doing well and is feeling slightly better but then found out on the fourth that her daughter suddenly died.  She is very upset that she is unable to attend the funeral.  Patient states that soon after that she started getting very bad nausea vomiting and diarrhea.  She did not take her Xarelto today but she did take her regular dose of atenolol.  Patient states that she has body pain from head to toe and feels very weak and like she is going to pass out when she stands up.  She says she has had some intermittent times when she is felt her heart racing but does not feel that right now.  She denies chest pain, shortness of breath.     HPI  Past Medical History:  Diagnosis Date  . Abnormal CT scan 03/16/2017  . Atrial fibrillation ([place])   . Bunion   . Carotid stenosis   . Carpal tunnel syndrome    mild   . Cervical arthritis   . Cholecystitis    s/p cholecystectomy  . Chronic back pain   . Chronic gastritis   . Depression   . Diabetic peripheral neuropathy ([place])   . Diarrhea 04/05/2015  . Digoxin toxicity    08/2011  . Diverticulitis    dx 04/08/14  . Diverticulosis   . Dysphagia    no documented strictures but responded positively to dilation in past.   . Epileptic seizure, tonic ([place])    .No  meds since age of 81.  . Family history of adverse reaction to anesthesia    "my mother may have"  . Fatty liver   . Fibromyalgia   . GERD (gastroesophageal reflux disease)   . H/O hiatal hernia    S/P hernia repair  . Hyperlipidemia   . Hypertension   . Hypothyroid   . Internal hemorrhoid   . Joint pain 05/08/2011  . Left knee pain 12/19/2014  . Migraine   . Mild cognitive impairment   . Muscle spasm of back 08/10/2013  . Nonspecific (abnormal) findings on radiological and other examination of gastrointestinal tract 08/07/2011  . OSA (obstructive sleep apnea) dx'd 2003   "can't sleep w/that mask; lost some weight; maybe that's helped" (04/26/2014)  . Osteoarthritis   . Pneumonia X 2  . Right foot pain 08/03/2013  . Sarcoidosis   . Skin neoplasm   . TIA (transient ischemic attack)    "don't know when I had it" (04/26/2014)  . Transaminase or LDH elevation   . Type II diabetes mellitus ([place])   . Ventral hernia s/p laparoscopic lysis of adhesions and hernia repair with mesh 07/29/11 08/01/2011    Patient Active Problem List   Diagnosis Date Noted  . COVID-19 virus detected 11/07/2018  . Vitamin D deficiency 04/28/2014  . BPPV (benign paroxysmal positional vertigo)  04/26/2014  . Poor dentition 04/03/2013  . Healthcare maintenance 03/31/2013  . Abnormality of gait 07/25/2012  . Headache 02/25/2012  . Chronic back pain 10/07/2011  . Financial difficulties 10/07/2011  . Hemorrhoids 10/06/2011  . Anemia 10/02/2011  . Diverticulosis 10/02/2011  . Depression 05/09/2009  . OBESITY, UNSPECIFIED 11/06/2008  . Carotid stenosis 11/06/2008  . FIBROMYALGIA, SEVERE 09/14/2008  . GERD 09/12/2008  . Osteopenia 06/22/2008  . Hyperlipidemia 06/30/2007  . TIA 06/01/2007  . DIABETIC PERIPHERAL NEUROPATHY 04/05/2006  . Diabetes type 2, controlled ([place]) 03/18/2006  . Hypothyroidism 12/21/2005  . Essential hypertension 12/21/2005  . Atrial fibrillation ([place]) 12/21/2005  . Osteoarthritis  12/21/2005    Past Surgical History:  Procedure Laterality Date  . ABDOMINAL HYSTERECTOMY  1980's   partial  . BILATERAL OOPHORECTOMY  1980's   "after partial hysterectomy"  . BREAST BIOPSY Right    benign  . BREAST EXCISIONAL BIOPSY Right 03/2006   benign  . BREAST SURGERY Right ~ 2010   excision milk duct;  . BUNIONECTOMY Bilateral   . CATARACT EXTRACTION W/PHACO  10/27/2010   Procedure: CATARACT EXTRACTION PHACO AND INTRAOCULAR LENS PLACEMENT (IOC);  Surgeon: Williams Che;  Location: [facility];  Service: Ophthalmology;  Laterality: Left;  CDE- 1.78  . CATARACT EXTRACTION W/PHACO  07/13/2011   Procedure: CATARACT EXTRACTION PHACO AND INTRAOCULAR LENS PLACEMENT (IOC);  Surgeon: Williams Che, MD;  Location: [facility];  Service: Ophthalmology;  Laterality: Right;  CDE:  1.65  . DILATION AND CURETTAGE OF UTERUS  1970; 1976  . ESOPHAGEAL DILATION  multiple  . FOOT SURGERY Left ~ 2011   "straightened toe & scraped bone below big toe"  . FOOT SURGERY Right ~ 2011   "scraped bone of big toe; shortened middle toet"  . HIATAL HERNIA REPAIR  1990   "had to have scar tissue removed 6 months after repair"  . Inverted nipples  1992  . LAPAROSCOPIC CHOLECYSTECTOMY  1980's  . LIPOSUCTION  1992  . SALPINGOOPHORECTOMY Bilateral    "6 months or so after hysterectomy"  . SKIN CANCER EXCISION  1990's   "front side of right shin"  . TUBAL LIGATION  1976  . VENTRAL HERNIA REPAIR  07/29/2011   Procedure: LAPAROSCOPIC VENTRAL HERNIA;  Surgeon: Adin Hector, MD;  Location: [place];  Service: General;  Laterality: N/A;  multiple incarcerated hernias with mesh     OB History   No obstetric history on file.      Home Medications    Prior to Admission medications   Medication Sig Start Date End Date Taking? Authorizing Provider  acetaminophen (TYLENOL) 500 MG tablet Take 500 mg by mouth every 6 (six) hours as needed for mild pain or headache.   Yes [provider]  atenolol (TENORMIN)  25 MG tablet TAKE 1 AND 1/2 TABLET TWICE A DAY Patient taking differently: Take 37.5 mg by mouth daily. TAKE 1 AND 1/2 TABLET TWICE A DAY 10/04/18  Yes Helberg, Larkin Ina, MD  baclofen (LIORESAL) 10 MG tablet Take 0.5-1 tablets (5-10 mg total) by mouth 3 (three) times daily as needed for muscle spasms. 07/12/18  Yes Hilts, Legrand Como, MD  Cholecalciferol (VITAMIN D3 PO) Take 5,000 Int'l Units by mouth daily.   Yes [provider]  diltiazem (CARDIZEM CD) 180 MG 24 hr capsule TAKE ONE (1) CAPSULE EACH DAY Patient taking differently: Take 180 mg by mouth daily.  10/04/18  Yes Helberg, Larkin Ina, MD  docusate sodium (COLACE) 100 MG capsule Take 100 mg by  mouth daily as needed for mild constipation.   Yes [provider]  DULoxetine (CYMBALTA) 60 MG capsule Take 1 capsule (60 mg total) by mouth daily. 10/27/18  Yes Helberg, Larkin Ina, MD  famotidine (PEPCID) 20 MG tablet TAKE ONE (1) TABLET EACH DAY Patient taking differently: Take 20 mg by mouth daily.  10/04/18  Yes Helberg, Larkin Ina, MD  levothyroxine (SYNTHROID) 50 MCG tablet TAKE ONE TABLET EVERY MORNING 10/04/18  Yes Helberg, Larkin Ina, MD  LORazepam (ATIVAN) 0.5 MG tablet Take 1 tablet (0.5 mg total) by mouth every 12 (twelve) hours as needed for anxiety. 11/12/18 11/12/19 Yes Asencion Noble, MD  losartan (COZAAR) 25 MG tablet Take 1 tablet (25 mg total) by mouth daily. 10/27/18  Yes Helberg, Larkin Ina, MD  Multiple Vitamin (MULTIVITAMIN) tablet Take 1 tablet by mouth daily.   Yes [provider]  ondansetron (ZOFRAN-ODT) 4 MG disintegrating tablet Take 1 tablet (4 mg total) by mouth every 8 (eight) hours as needed for nausea. 05/02/18  Yes Melvenia Beam, MD  PE-diphenhydrAMINE-DM-GG-APAP (MUCINEX FAST-MAX DAY/NIGHT PO) Take 2 tablets by mouth every 6 (six) hours as needed (cold symptoms).   Yes [provider]  Phenylephrine-Pheniramine-DM [facility] COLD & COUGH) 12-27-18 MG PACK Take 1 Package by mouth every 6 (six) hours as needed  (cough).   Yes [provider]  Propylene Glycol (SYSTANE BALANCE) 0.6 % SOLN Place 1 drop into both eyes daily as needed (dry eyes).   Yes [provider]  rivaroxaban (XARELTO) 20 MG TABS tablet TAKE 1 TABLET DAILY WITH SUPPER 09/08/18  Yes Helberg, Larkin Ina, MD  rosuvastatin (CRESTOR) 20 MG tablet TAKE ONE (1) TABLET EACH DAY Patient taking differently: Take 20 mg by mouth daily.  10/04/18  Yes Ina Homes, MD    Family History Family History  Problem Relation Age of Onset  . Crohn's disease Mother   . Colitis Mother        Crohns  . Anesthesia problems Mother   . Arthritis Mother        rheumatoid; maternal side of family also with RA  . Pneumonia Mother   . Diabetes Father   . Heart disease Father   . Melanoma Father   . Coronary artery disease Father   . Skin cancer Father   . Stroke Father   . Diabetes Sister   . Migraines Sister   . Depression Maternal Uncle   . Dementia Maternal Uncle   . Colon cancer Paternal Uncle        ? if stomach or colon   . Hypotension Neg Hx   . Malignant hyperthermia Neg Hx   . Pseudochol deficiency Neg Hx     Social History Social History   Tobacco Use  . Smoking status: Never Smoker  . Smokeless tobacco: Never Used  Substance Use Topics  . Alcohol use: Never    Alcohol/week: 0.0 standard drinks    Frequency: Never  . Drug use: Never     Allergies   Gemfibrozil, Latex, Penicillins, Adhesive [tape], Codeine, Digoxin and related, Metformin and related, Omeprazole, and Ace inhibitors   Review of Systems Review of Systems  Ten systems reviewed and are negative for acute change, except as noted in the HPI.   Physical Exam Updated Vital Signs BP (!) 91/56 (BP Location: Right Arm)   Pulse (!) 54   Temp 99.3 F (37.4 C) (Oral)   Resp 20   LMP 06/03/1978   SpO2 98%   Physical Exam Vitals signs and nursing  note reviewed.  Constitutional:      General: She is not in acute distress.    Appearance: She is  well-developed. She is ill-appearing. She is not diaphoretic.  HENT:     Head: Normocephalic and atraumatic.  Eyes:     General: No scleral icterus.    Conjunctiva/sclera: Conjunctivae normal.  Neck:     Musculoskeletal: Normal range of motion.  Cardiovascular:     Rate and Rhythm: Normal rate and regular rhythm.     Heart sounds: Normal heart sounds. No murmur. No friction rub. No gallop.   Pulmonary:     Effort: Pulmonary effort is normal. No respiratory distress.     Breath sounds: Normal breath sounds. No wheezing.  Abdominal:     General: Bowel sounds are normal. There is no distension.     Palpations: Abdomen is soft. There is no mass.     Tenderness: There is no abdominal tenderness. There is no guarding.  Skin:    General: Skin is warm and dry.  Neurological:     Mental Status: She is alert and oriented to person, place, and time.  Psychiatric:        Behavior: Behavior normal.      ED Treatments / Results  Labs (all labs ordered are listed, but only abnormal results are displayed) Labs Reviewed  LACTIC ACID, PLASMA  CBC WITH DIFFERENTIAL/PLATELET  LACTIC ACID, PLASMA  COMPREHENSIVE METABOLIC PANEL  URINALYSIS, ROUTINE W REFLEX MICROSCOPIC    EKG None  Radiology Dg Chest Port 1 View  Result Date: 11/17/2018 CLINICAL DATA:  COVID-19 positive EXAM: PORTABLE CHEST 1 VIEW COMPARISON:  Radiograph 11/10/2018 FINDINGS: Patchy peripheral predominant areas of airspace consolidation and interstitial change. No visible pneumothorax. Bandlike areas of opacity likely reflect atelectasis and/or scarring. No effusions. The aorta is calcified. The remaining cardiomediastinal contours are unremarkable. Degenerative changes are present in the and imaged spine and shoulders. Mask wire at the base of the neck. IMPRESSION: New peripheral predominant areas of airspace consolidation and interstitial change, compatible with multifocal pneumonia. Electronically Signed   By: Lovena Le  M.D.   On: 11/17/2018 16:01    Procedures Procedures (including critical care time)  Medications Ordered in ED Medications - No data to display   Initial Impression / Assessment and Plan / ED Course  I have reviewed the triage vital signs and the nursing notes.  Pertinent labs & imaging results that were available during my care of the patient were reviewed by me and considered in my medical decision making (see chart for details).  Clinical Course as of Nov 17 1650  Thu Nov 17, 2018  1649 WBC: 6.4 [AH]    Clinical Course User Index [AH] Margarita Mail, PA-C       CC: Viral illness, weakness VS:  Vitals:   11/17/18 1609 11/17/18 1730 11/17/18 1756 11/17/18 2016  BP: (!) 91/56 101/63 111/71 (!) 104/58  Pulse: (!) 54 60 61   Resp: 20 13 17 20   Temp:      TempSrc:      SpO2: 98% 98% 98% 98%    [id number] is gathered by patientand EMR. DDX:The differential diagnosis of weakness includes but is not limited to neurologic causes (GBS, myasthenia gravis, CVA, MS, ALS, transverse myelitis, spinal cord injury, CVA, botulism, ) and other causes: ACS, Arrhythmia, syncope, orthostatic hypotension, sepsis, hypoglycemia, electrolyte disturbance, hypothyroidism, respiratory failure, symptomatic anemia, dehydration, heat injury, polypharmacy, malignancy. Labs: I reviewed the labs which show mild hypokalemia, elevated blood glucose,  decreased albumin level, normal lactic acid level, normal white blood cell count Imaging: I personally reviewed the images (1 view chest x-ray) which show(s) multi focal pneumonia EKG: MDM: Patient here with known coronavirus.  She has been dehydrated after nausea and vomiting.  Patient has had no active vomiting here and has eaten.  She is gotten fluids.  Think that majority of this is secondary to her viral symptoms.  Will give Zofran at discharge.  Multifocal pneumonia present reflective of her coronavirus diagnosis.  She is having no hypoxia or shortness of  breath.  I do not think that this is a superimposed secondary bacterial infection do not feel she needs antibiotic treatment.  Discussed with Dr. Rogene Houston is in agreement. Patient disposition: Discharge Patient condition: Stable. The patient appears reasonably screened and/or stabilized for discharge and I doubt any other medical condition or other [facility] requiring further screening, evaluation, or treatment in the ED at this time prior to discharge. I have discussed lab and/or imaging findings with the patient and answered all questions/concerns to the best of my ability. I have discussed return precautions and OP follow up.      Final Clinical Impressions(s) / ED Diagnoses   Final diagnoses:  [id number] virus detected    ED Discharge Orders    None       Margarita Mail, PA-C 11/19/18 0009    Fredia Sorrow, MD 12/04/18 [phone]

## 2018-11-17 NOTE — Assessment & Plan Note (Addendum)
Patient with productive cough, shortness of breath, sneezing, body aches, nausea, and vomiting, and decreased food intake for the past 2 weeks. Patient states that she has lost 6lbs over the course of 1 week. She has a temperature of 98.8, but she states that her temperature is usually 96. Patient tested COVID positive per test done 11/08/18. The patient states that she has been using tylenol, theraflu, drinking lots of water and Gatorade. Patient has been social distancing with her husband.  Of note the patient's daughter died on 15-Nov-2018.   Assessment and plan  [name] is a patient without baseline respiratory problems and with controlled diabetes who is covid positive and with worsening symptoms over the past 2 weeks. Recommended that patient either get a blood pressure cuff and pulse oximetry to check her vitals at home or to go to ED and get assessed in person to determine if she will benefit from an admission for further assessment of her symptoms, imaging (CXR), labs (troponin, pt, fibrinogen, bmp, cbc, etc), and discussion of possible treatments toclizumab vs remdesivir. Patient chose to go to [facility] ED.  The patient maybe having progressive covid symptoms vs possible supra-bacterial infection   -Called [facility] ED and informed them of the patient coming to get evaluated

## 2018-11-19 ENCOUNTER — Telehealth: Payer: Self-pay | Admitting: Internal Medicine

## 2018-11-19 NOTE — Telephone Encounter (Signed)
   Reason for call:   I received a call from Ms. Sheffield Slider at 6:05 PM indicating that the patient was experiencing weakness, malaise, and appeared pale. She was diagnosed with COVID-19 on September 1st, 2020.  In addition she had a few episodes of blood in her stool and is constipated.   Pertinent Data:   Vitals, 114/72, HR 72, RR 24, 97% on RA, Temp 97.6, CBG 159 per EMT present at home.  Patient described as somewhat pale, tachypneic, and with malaise but not more so than typical for a viral illness.  Constipation with blood in/on stool: Have tried miralax and colace  Although the blood on her stool is most likely due to constipation from decreased oral intake of fluids and consumption of only heavy protein shakes, there is concern that she could have new onset of symptoms from an alternative etiology.    Assessment / Plan / Recommendations:   I have recommended that they visit the ER for fluids, a CBC and BMP.   She will talk it over with the EMT and consider if transport is indicated.   As always, pt is advised that if symptoms worsen or new symptoms arise, they should go to an urgent care facility or to to ER for further evaluation.   Kathi Ludwig, MD   11/19/2018, 6:12 PM

## 2018-11-21 ENCOUNTER — Telehealth: Payer: Self-pay | Admitting: *Deleted

## 2018-11-21 NOTE — Telephone Encounter (Signed)
Pt's spouse calls and states pt is constantly bleeding from the rectum, states she is wearing 3 pads to contain the bleeding must change several times daily. It does not stop. She still has not had a BM but the blood is coming all the time and has big clots in it. She is getting weak and shakes all over.  She is advised to go to ED and tell them as soon as she walks in she is COVID positive. He is agreeable

## 2018-11-21 NOTE — Telephone Encounter (Signed)
Thanks [employer]. I agree.

## 2018-11-21 NOTE — Telephone Encounter (Signed)
I called pt and spouse no answer at either

## 2018-11-22 NOTE — Telephone Encounter (Signed)
Have called once again no answer at any of their phones Closing chart

## 2018-11-28 NOTE — Addendum Note (Signed)
Addended by: Forde Dandy on: 11/28/2018 05:09 PM   Modules accepted: Orders

## 2018-11-29 ENCOUNTER — Other Ambulatory Visit: Payer: Self-pay

## 2018-11-29 DIAGNOSIS — R6889 Other general symptoms and signs: Secondary | ICD-10-CM | POA: Diagnosis not present

## 2018-11-29 DIAGNOSIS — Z20822 Contact with and (suspected) exposure to covid-19: Secondary | ICD-10-CM

## 2018-11-30 ENCOUNTER — Other Ambulatory Visit: Payer: Self-pay | Admitting: *Deleted

## 2018-11-30 LAB — NOVEL CORONAVIRUS, NAA: SARS-CoV-2, NAA: NOT DETECTED

## 2018-11-30 NOTE — Patient Outreach (Signed)
[facility]) Care Management  11/30/2018  [name] December 25, 1950 [id number]   Referral received 11/29/2018 Initial Outreach 11/30/2018   Telephone Assessment-Unsuccessful  RN attempted outreach however unsuccessful. RN able to leave a HIPAA approved voice message requesting a call back.  Will follow up within the next 4 business day for pending [facility] services and inquire further on pt's possible needs.  Raina Mina, RN Care Management Coordinator [place] Office [phone]

## 2018-12-01 ENCOUNTER — Ambulatory Visit (INDEPENDENT_AMBULATORY_CARE_PROVIDER_SITE_OTHER): Payer: PPO | Admitting: Licensed Clinical Social Worker

## 2018-12-01 ENCOUNTER — Other Ambulatory Visit: Payer: Self-pay

## 2018-12-01 DIAGNOSIS — F4323 Adjustment disorder with mixed anxiety and depressed mood: Secondary | ICD-10-CM

## 2018-12-02 ENCOUNTER — Ambulatory Visit: Payer: PPO | Admitting: Pharmacist

## 2018-12-02 DIAGNOSIS — E1121 Type 2 diabetes mellitus with diabetic nephropathy: Secondary | ICD-10-CM

## 2018-12-02 NOTE — Progress Notes (Signed)
Medication Samples have been provided to the patient.  Drug name: Xarelto       Strength: 10 mg        Qty: 16  LOT: [id number]  Exp.Date: 12/22  Dosing instructions: 2 tablets daily  The patient has been instructed regarding the correct time, dose, and frequency of taking this medication, including desired effects and most common side effects.   Flossie Dibble 9:48 AM 12/02/2018

## 2018-12-02 NOTE — Addendum Note (Signed)
Addended by: Forde Dandy on: 12/02/2018 10:49 AM   Modules accepted: Orders

## 2018-12-05 ENCOUNTER — Other Ambulatory Visit: Payer: Self-pay | Admitting: *Deleted

## 2018-12-05 NOTE — Patient Outreach (Signed)
[facility]) Care Management  12/05/2018  [name] 23-Jun-1950 [id number]  Referral received 9/22  Telephone Assessment   Outreach attempted #2 unsuccessful however RN able to leave a HIPAA approved voice message requesting a call back for pending [facility] services.   Will scheduled another outreach within the next 4 business days.  Raina Mina, RN Care Management Coordinator [place] Office [phone]

## 2018-12-06 ENCOUNTER — Encounter: Payer: Self-pay | Admitting: Licensed Clinical Social Worker

## 2018-12-06 NOTE — BH Specialist Note (Signed)
Integrated Behavioral Health Follow Up Visit  MRN: [id number] Name: [name]  Number of [facility] Clinician visits: 2/6 Session Start time: 1:10  Session End time: 2:00 Total time: 50 minutes  Type of Service: Integrated Behavioral Health- Individual Interpretor:No.  SUBJECTIVE: [name] is a 68 y.o. female whom attended the session individually.  Patient was referred by Dr. Tarri Abernethy for depression. Patient reports the following symptoms/concerns: recent grief, mild depression, and health concerns.  Duration of problem: increased over the past month; Severity of problem: moderate  OBJECTIVE: Mood: Depressed and Affect: Tearful Risk of harm to self or others: No plan to harm self or others  LIFE CONTEXT: Family and Social: Patient's daughter recently passed unexpectedly. Patient's son is now speaking to her, and the patient is hopeful this relationship will improve.  Self-Care: Patient is recovering from contracting Covid.   GOALS ADDRESSED: Patient will: 1.  Reduce symptoms of: depression and grief.  2.  Increase knowledge and/or ability of: coping skills and stress reduction  3.  Demonstrate ability to: Increase healthy adjustment to current life circumstances, Increase adequate support systems for patient/family and Begin healthy grieving over loss  INTERVENTIONS: Interventions utilized:  Mindfulness or Relaxation Training, Brief CBT and Supportive Counseling Standardized Assessments completed: assessed for SI, HI, and self-harm.  ASSESSMENT: Patient currently experiencing moderate levels of depression due to a recent loss and challenges with family dynamics. Patient's daughter passed away unexpectedly, and the patient is grieving the loss of her daughter. Patient also contracted Covid during this timeframe, and she was unable to visit her family due to having to self-quarantine.  Patient is still reporting symptoms of depression, but has  no thoughts of harming herself. Patient is hopeful that her son will continue to accept her recent decision to become married.   Patient may benefit from counseling.  PLAN: 1. Follow up with behavioral health clinician on :   Dessie Coma, [facility], [place]

## 2018-12-07 ENCOUNTER — Other Ambulatory Visit: Payer: Self-pay | Admitting: *Deleted

## 2018-12-07 NOTE — Patient Outreach (Signed)
[facility]) Care Management  12/07/2018  Mystery Villatoro 06/14/1950 [id number]  Case closure  Pt will be followed by [facility] care management team. This discipline with be discontinued.  Raina Mina, RN Care Management Coordinator [place] Office [phone]

## 2018-12-09 ENCOUNTER — Encounter: Payer: Self-pay | Admitting: *Deleted

## 2018-12-09 NOTE — Telephone Encounter (Signed)
This encounter was created in error - please disregard.

## 2018-12-22 ENCOUNTER — Ambulatory Visit (INDEPENDENT_AMBULATORY_CARE_PROVIDER_SITE_OTHER): Payer: PPO | Admitting: Internal Medicine

## 2018-12-22 ENCOUNTER — Encounter: Payer: Self-pay | Admitting: Licensed Clinical Social Worker

## 2018-12-22 ENCOUNTER — Ambulatory Visit (INDEPENDENT_AMBULATORY_CARE_PROVIDER_SITE_OTHER): Payer: PPO | Admitting: Licensed Clinical Social Worker

## 2018-12-22 ENCOUNTER — Other Ambulatory Visit: Payer: Self-pay

## 2018-12-22 DIAGNOSIS — Z634 Disappearance and death of family member: Secondary | ICD-10-CM

## 2018-12-22 DIAGNOSIS — Z7901 Long term (current) use of anticoagulants: Secondary | ICD-10-CM

## 2018-12-22 DIAGNOSIS — E119 Type 2 diabetes mellitus without complications: Secondary | ICD-10-CM

## 2018-12-22 DIAGNOSIS — I482 Chronic atrial fibrillation, unspecified: Secondary | ICD-10-CM

## 2018-12-22 DIAGNOSIS — F4323 Adjustment disorder with mixed anxiety and depressed mood: Secondary | ICD-10-CM

## 2018-12-22 DIAGNOSIS — I10 Essential (primary) hypertension: Secondary | ICD-10-CM | POA: Diagnosis not present

## 2018-12-22 DIAGNOSIS — Z638 Other specified problems related to primary support group: Secondary | ICD-10-CM

## 2018-12-22 DIAGNOSIS — E785 Hyperlipidemia, unspecified: Secondary | ICD-10-CM | POA: Diagnosis not present

## 2018-12-22 DIAGNOSIS — E039 Hypothyroidism, unspecified: Secondary | ICD-10-CM | POA: Diagnosis not present

## 2018-12-22 DIAGNOSIS — F339 Major depressive disorder, recurrent, unspecified: Secondary | ICD-10-CM

## 2018-12-22 DIAGNOSIS — I480 Paroxysmal atrial fibrillation: Secondary | ICD-10-CM | POA: Diagnosis not present

## 2018-12-22 DIAGNOSIS — F32A Depression, unspecified: Secondary | ICD-10-CM

## 2018-12-22 DIAGNOSIS — F329 Major depressive disorder, single episode, unspecified: Secondary | ICD-10-CM

## 2018-12-22 DIAGNOSIS — K219 Gastro-esophageal reflux disease without esophagitis: Secondary | ICD-10-CM | POA: Diagnosis not present

## 2018-12-22 DIAGNOSIS — Z79890 Hormone replacement therapy (postmenopausal): Secondary | ICD-10-CM

## 2018-12-22 DIAGNOSIS — Z79899 Other long term (current) drug therapy: Secondary | ICD-10-CM | POA: Diagnosis not present

## 2018-12-22 MED ORDER — LEVOTHYROXINE SODIUM 50 MCG PO TABS: 50.0000 ug | ORAL_TABLET | Freq: Every morning | ORAL | 3 refills | Status: DC

## 2018-12-22 MED ORDER — LOSARTAN POTASSIUM 25 MG PO TABS: 25.0000 mg | ORAL_TABLET | Freq: Every day | ORAL | 3 refills | Status: DC

## 2018-12-22 MED ORDER — DILTIAZEM HCL ER COATED BEADS 180 MG PO CP24: 180.0000 mg | ORAL_CAPSULE | Freq: Every day | ORAL | 3 refills | Status: DC

## 2018-12-22 MED ORDER — ATENOLOL 25 MG PO TABS: 37.5000 mg | ORAL_TABLET | Freq: Every day | ORAL | 3 refills | Status: DC

## 2018-12-22 MED ORDER — DULOXETINE HCL 60 MG PO CPEP: 60.0000 mg | ORAL_CAPSULE | Freq: Every day | ORAL | 3 refills | Status: DC

## 2018-12-22 MED ORDER — ROSUVASTATIN CALCIUM 20 MG PO TABS: 20.0000 mg | ORAL_TABLET | Freq: Every day | ORAL | 3 refills | Status: DC

## 2018-12-22 MED ORDER — FAMOTIDINE 20 MG PO TABS: 20.0000 mg | ORAL_TABLET | Freq: Every day | ORAL | 3 refills | Status: DC

## 2018-12-22 NOTE — Assessment & Plan Note (Signed)
Patient presents for continued evaluation and management of her depression. She is currently on duloxetine 60 mg daily. She is tolerating this medication well. She continues to have significant life stressors that affects her mood. She recently was married however her son did not approve of the marriage and this was causing stress in her life. Then unfortunately in early September her daughter unexpectedly passed away. She has been dealing with signs/symptoms of bereavement. She called in the clinic and was given a couple doses of Ativan to assist her through this time. In addition to this she was started on hydroxyzine. She continues to follow with Dessie Coma for counseling. She finds this very beneficial.  A/P: - STOP Lorazepam  - Okay to continue Hydroxyzine but this will not be a long term medication.  - Continue Duloxetine 60 mg QD  - Continue counseling with Dessie Coma.

## 2018-12-22 NOTE — Assessment & Plan Note (Signed)
Refill for patients levothyroxine 50 mcg QD sent.

## 2018-12-22 NOTE — Assessment & Plan Note (Signed)
Patient presents for continued evaluation and management of her hypertension. She is currently on diltiazem 180 mg daily, atenolol 37.5 mg daily, and losartan 25 mg daily. She is tolerating all these medications well without apparent side effects. She denies orthostatic symptoms. She denies any issues affording the medications. She is recent BMP with normal renal function and potassium.  A/P: - Continue  diltiazem 180 mg daily, atenolol 37.5 mg daily, and losartan 25 mg daily - Recheck BMP at the next visit

## 2018-12-22 NOTE — BH Specialist Note (Signed)
Integrated Behavioral Health Follow Up Visit  MRN: [id number] Name: [name]  Number of [place] Clinician visits: 3/6 Session Start time: 9:30  Session End time: 10:15 Total time: 45 minutes  Type of Service: [place] Interpretor:No.  SUBJECTIVE: [name] is a 68 y.o. female accompanied by whom attended the session indivdiually.  Patient was referred by Dr. Tarri Abernethy for depression Patient reports the following symptoms/concerns: recent grief, moderate levels of anxiety, sleep disturbances, depression, and health issues.  Duration of problem: increased over the past two months; Severity of problem: moderate  OBJECTIVE: Mood: Anxious and Depressed and Affect: Depressed Risk of harm to self or others: No plan to harm self or others  LIFE CONTEXT: Family and Social: Patient is continuing to grieve the loss of her daughter. Patient expressed anxiety around not knowing the cause of her daughter's unexpected passing, and would feels that learning the reason would be beneficial in her moving forward. Patient has not heard from her grandchildren in a week, and reported this to be adding to her distress.   GOALS ADDRESSED: Patient will: 1.  Reduce symptoms of: anxiety, depression, insomnia and stress  2.  Increase knowledge and/or ability of: coping skills, healthy habits and stress reduction  3.  Demonstrate ability to: Increase healthy adjustment to current life circumstances, Increase adequate support systems for patient/family and Begin healthy grieving over loss  INTERVENTIONS: Interventions utilized:  Mindfulness or Relaxation Training, Behavioral Activation, Brief CBT and Supportive Counseling Standardized Assessments completed: assessed for SI, HI, and self-harm.  ASSESSMENT: Patient currently experiencing moderate levels of anxiety and depression. Patient is continuing to grieve the loss of her daughter.  Patient is worrying about all different kinds of things, has sleep disturbances, restlessness, muscle tension, difficulty controlling the worry, and crying spells. Patient is worrying about topics that are out of her control. Patient was encouraged to focus on what she can control, and her response to stressors.   Patient may benefit from counseling  PLAN: 1. Follow up with behavioral health clinician on : three weeks.   Dessie Coma, [facility]

## 2018-12-22 NOTE — Assessment & Plan Note (Signed)
Patient with paroxysmal atrial fibrillation. She is currently rate controlled with atenolol 37.5 mg daily. She is currently anticoagulated with Xarelto. No apparent adverse effects do these medications. No signs of bleeding.  A/P: - Continue Atenolol 37.5 mg QD and Xarelto 20 mg QD

## 2018-12-22 NOTE — Progress Notes (Signed)
Internal Medicine Clinic Attending  Case discussed with Dr. Helberg at the time of the visit.  We reviewed the resident's history and exam and pertinent patient test results.  I agree with the assessment, diagnosis, and plan of care documented in the resident's note.  Alexander Raines, M.D., Ph.D.  

## 2018-12-22 NOTE — Patient Instructions (Signed)
Thank you for allowing [place] to provide your care. Continue all your medications as prescribed. I'm so sorry to hear about your daughter. If you need anything please do not hesitate to give [place] a call. I would like to see you back in three months just to make sure things are going okay.

## 2018-12-22 NOTE — Progress Notes (Signed)
   CC: DM, HTN, Bereavement   HPI:  [name] is a 68 y.o. female with PMHx listed below presenting for DM, HTN, Bereavement . Please see the A&P for the status of the patient's chronic medical problems.  Past Medical History:  Diagnosis Date  . Abnormal CT scan 03/16/2017  . Atrial fibrillation ([place])   . Bunion   . Carotid stenosis   . Carpal tunnel syndrome    mild   . Cervical arthritis   . Cholecystitis    s/p cholecystectomy  . Chronic back pain   . Chronic gastritis   . Depression   . Diabetic peripheral neuropathy ([place])   . Diarrhea 04/05/2015  . Digoxin toxicity    08/2011  . Diverticulitis    dx 04/08/14  . Diverticulosis   . Dysphagia    no documented strictures but responded positively to dilation in past.   . Epileptic seizure, tonic ([place])    .No meds since age of 13.  . Family history of adverse reaction to anesthesia    "my mother may have"  . Fatty liver   . Fibromyalgia   . GERD (gastroesophageal reflux disease)   . H/O hiatal hernia    S/P hernia repair  . Hyperlipidemia   . Hypertension   . Hypothyroid   . Internal hemorrhoid   . Joint pain 05/08/2011  . Left knee pain 12/19/2014  . Migraine   . Mild cognitive impairment   . Muscle spasm of back 08/10/2013  . Nonspecific (abnormal) findings on radiological and other examination of gastrointestinal tract 08/07/2011  . OSA (obstructive sleep apnea) dx'd 2003   "can't sleep w/that mask; lost some weight; maybe that's helped" (04/26/2014)  . Osteoarthritis   . Pneumonia X 2  . Right foot pain 08/03/2013  . Sarcoidosis   . Skin neoplasm   . TIA (transient ischemic attack)    "don't know when I had it" (04/26/2014)  . Transaminase or LDH elevation   . Type II diabetes mellitus ([place])   . Ventral hernia s/p laparoscopic lysis of adhesions and hernia repair with mesh 07/29/11 08/01/2011   Review of Systems:  Performed and all others negative.  Physical Exam: Vitals:   12/22/18 1025  BP: 140/66   Pulse: 79  Temp: 98.3 F (36.8 C)  TempSrc: Oral  SpO2: 99%  Weight: 179 lb 8 oz (81.4 kg)  Height: 5\' 3"  (1.6 m)   General: Well nourished female in no acute distress Pulm: Good air movement with no wheezing or crackles  CV: RRR, no murmurs, no rubs   Assessment & Plan:   See Encounters Tab for problem based charting.  Patient discussed with Dr. Rebeca Alert

## 2018-12-22 NOTE — Assessment & Plan Note (Signed)
Refill for patients rosuvastatin 20 mg daily sent.

## 2018-12-22 NOTE — Assessment & Plan Note (Signed)
Refill for famotidine 20 mg daily sent.

## 2019-01-10 ENCOUNTER — Other Ambulatory Visit: Payer: Self-pay | Admitting: *Deleted

## 2019-01-11 MED ORDER — HYDROXYZINE HCL 50 MG PO TABS: 50.0000 mg | ORAL_TABLET | Freq: Every day | ORAL | 0 refills | Status: DC | PRN

## 2019-01-12 ENCOUNTER — Ambulatory Visit (INDEPENDENT_AMBULATORY_CARE_PROVIDER_SITE_OTHER): Payer: PPO | Admitting: Licensed Clinical Social Worker

## 2019-01-12 ENCOUNTER — Other Ambulatory Visit: Payer: Self-pay

## 2019-01-12 ENCOUNTER — Encounter: Payer: Self-pay | Admitting: Licensed Clinical Social Worker

## 2019-01-12 DIAGNOSIS — F4323 Adjustment disorder with mixed anxiety and depressed mood: Secondary | ICD-10-CM

## 2019-01-12 NOTE — BH Specialist Note (Signed)
Integrated Behavioral Health Follow Up Visit  MRN: [id number] Name: [name]  Number of [place] Clinician visits: 4/6 Session Start time: 9:37  Session End time: 10:27 Total time: 50 minutes  Type of Service: [place] Interpretor:No.   SUBJECTIVE: [name] is a 68 y.o. female accompanied by whom attended the session indivdiually.  Patient was referred by Dr. Tarri Abernethy for depression. Patient reports the following symptoms/concerns: recent grief, moderate levels of anxiety, sleep disturbances, depression, and health issues.  Duration of problem: increased over the past two months; Severity of problem: moderate  OBJECTIVE: Mood: Anxious and Depressed and Affect: Depressed Risk of harm to self or others: No plan to harm self or others  GOALS ADDRESSED: Patient will: 1.  Reduce symptoms of: anxiety, depression and stress  2.  Increase knowledge and/or ability of: coping skills, healthy habits and stress reduction  3.  Demonstrate ability to: Increase healthy adjustment to current life circumstances, Increase adequate support systems for patient/family and Begin healthy grieving over loss  INTERVENTIONS: Interventions utilized:  Mindfulness or Relaxation Training, Brief CBT and Supportive Counseling Standardized Assessments completed: assessed for SI, HI, and self-harm.  ASSESSMENT: Patient currently experiencing moderate levels of depression with anxious distress. Patient is continuing to process the past, and has challenges staying in the present moment. Patient is grieving the loss of her daughter, and has many unanswered questions related to her passing. Patient has a supportive partner that she processes her feelings and concerns with. Patient is rebuilding her relationship with her son, and is hopeful about the future with him.  Patient is acknowledging that she cannot change the past, and needs to find ways to  move forward.   Patient may benefit from counseling  PLAN: 1. Follow up with behavioral health clinician on : one month.   Dessie Coma, [facility], [place]

## 2019-01-16 ENCOUNTER — Telehealth: Payer: Self-pay | Admitting: *Deleted

## 2019-01-16 NOTE — Telephone Encounter (Signed)
Pt states she bumped her leg against a frame appr 1 month ago, it "peeled the skin back" it continues to cause her pain, conts to be open, bleeds some, the area down to her foot is swollen and red, painful. [place] 11/10 at 1015

## 2019-01-17 ENCOUNTER — Ambulatory Visit: Payer: PPO

## 2019-01-17 NOTE — Telephone Encounter (Signed)
Patient cancelled today's [place] visit to have leg/foot evaluated and r/s for tomorrow. Attempted to cancel tomorrow's appt as well but was transferred to Triage RN from front office for concerning symptoms. Patient states she's not feeling well and does not want to come tomorrow. C/o h/a, back pain, arthritis pain, leg and foot remain swollen and she feels "tightness" in leg. Also, notes area is warm to touch and is different color from other leg (bluish). Denies fever, chills. States the 2 inch cut on leg is healing. Patient was dx with Covid-19 on November 28, 2018 and daughter died from MI on Dec 01, 2018. Now husband is admitted with sepsis. Patient strongly encouraged to keep tomorrow's ACC appt or go to ED today to have leg evaluated. States she will keep tomorrow's ACC appt. Will route to [place] as well as patient's next appt with her is not till 02/09/2019 and patient sounds like she is having difficulty with daughter's death and husband's dx. Hubbard Hartshorn, BSN, RN-BC

## 2019-01-17 NOTE — Telephone Encounter (Signed)
Thank you, agree she should be evaluated here or in ED, hopefully she will make it to her appt tomorrow. Agree with trying to get her in with [place] sooner.

## 2019-01-17 NOTE — Telephone Encounter (Signed)
Thank you :)

## 2019-01-18 ENCOUNTER — Other Ambulatory Visit: Payer: Self-pay

## 2019-01-18 ENCOUNTER — Encounter: Payer: Self-pay | Admitting: Internal Medicine

## 2019-01-18 ENCOUNTER — Ambulatory Visit (INDEPENDENT_AMBULATORY_CARE_PROVIDER_SITE_OTHER): Payer: PPO | Admitting: Internal Medicine

## 2019-01-18 DIAGNOSIS — L03115 Cellulitis of right lower limb: Secondary | ICD-10-CM | POA: Diagnosis not present

## 2019-01-18 DIAGNOSIS — M7989 Other specified soft tissue disorders: Secondary | ICD-10-CM | POA: Insufficient documentation

## 2019-01-18 DIAGNOSIS — E119 Type 2 diabetes mellitus without complications: Secondary | ICD-10-CM

## 2019-01-18 DIAGNOSIS — S81801A Unspecified open wound, right lower leg, initial encounter: Secondary | ICD-10-CM

## 2019-01-18 DIAGNOSIS — S81811D Laceration without foreign body, right lower leg, subsequent encounter: Secondary | ICD-10-CM | POA: Diagnosis not present

## 2019-01-18 DIAGNOSIS — W25XXXD Contact with sharp glass, subsequent encounter: Secondary | ICD-10-CM | POA: Diagnosis not present

## 2019-01-18 DIAGNOSIS — Z880 Allergy status to penicillin: Secondary | ICD-10-CM

## 2019-01-18 MED ORDER — CEPHALEXIN 500 MG PO CAPS: 500.0000 mg | ORAL_CAPSULE | Freq: Four times a day (QID) | ORAL | 0 refills | Status: AC

## 2019-01-18 NOTE — Patient Instructions (Addendum)
[name],   The pain you are right leg is due to an infection called cellulitis.  We will treat this with antibiotics.  I sent a prescription for Keflex to treat your infection.  You will take 1 tablet 4 times a day for the next 5 days.  We applied a dressing to help with healing and hydration. You will need to leave this in place for 5-7 days.   Please make a follow-up appointment with [place] in 1 week.  However, if your pain is getting worse or if you develop new fever or chills give [place] a call and let us know, do not wait until to your appointment.  - Dr. Frederico Hamman

## 2019-01-18 NOTE — Assessment & Plan Note (Addendum)
Patient presents complaining of RLE swelling.  About 1 month ago she cut herself with a picture frame on the anterior aspect of RLE (below shin).  She started using hydrogen peroxide and alcohol on the wound and reports it healed well but her skin is now dry and very itchy.  A few days ago she noticed redness in the area she has been scratching as well as swelling of her lower leg.  Denies systemic symptoms of infection.  She is a diabetic but is well controlled and is not taking any medications for this.  A1c 6.3 two months ago.  On exam, there are several excoriations on the lateral side of RLE with surrounding erythema as well as nonpitting edema.  Clear fluid weeping from the wound, but no purulent discharge.  Will treat for soft tissue infection with Keflex 500 mg QID x 5 days. Of note, has allergy to PCN but has taken Keflex in the past without any issues.  Placed silicone gel adhesive that will stay in place for 5-7 days.  We will follow up next week.  Advised to call clinic if she develops systemic signs of infections or if the pain worsens.

## 2019-01-18 NOTE — Progress Notes (Signed)
   CC: Right leg pain  HPI:  [name] is a 68 y.o. year-old female with PMH listed below who presents to clinic for right leg pain. Please see problem based assessment and plan for further details.   Past Medical History:  Diagnosis Date  . Abnormal CT scan 03/16/2017  . Atrial fibrillation ([place])   . Bunion   . Carotid stenosis   . Carpal tunnel syndrome    mild   . Cervical arthritis   . Cholecystitis    s/p cholecystectomy  . Chronic back pain   . Chronic gastritis   . Depression   . Diabetic peripheral neuropathy ([place])   . Diarrhea 04/05/2015  . Digoxin toxicity    08/2011  . Diverticulitis    dx 04/08/14  . Diverticulosis   . Dysphagia    no documented strictures but responded positively to dilation in past.   . Epileptic seizure, tonic ([place])    .No meds since age of 23.  . Family history of adverse reaction to anesthesia    "my mother may have"  . Fatty liver   . Fibromyalgia   . GERD (gastroesophageal reflux disease)   . H/O hiatal hernia    S/P hernia repair  . Hyperlipidemia   . Hypertension   . Hypothyroid   . Internal hemorrhoid   . Joint pain 05/08/2011  . Left knee pain 12/19/2014  . Migraine   . Mild cognitive impairment   . Muscle spasm of back 08/10/2013  . Nonspecific (abnormal) findings on radiological and other examination of gastrointestinal tract 08/07/2011  . OSA (obstructive sleep apnea) dx'd 2003   "can't sleep w/that mask; lost some weight; maybe that's helped" (04/26/2014)  . Osteoarthritis   . Pneumonia X 2  . Right foot pain 08/03/2013  . Sarcoidosis   . Skin neoplasm   . TIA (transient ischemic attack)    "don't know when I had it" (04/26/2014)  . Transaminase or LDH elevation   . Type II diabetes mellitus ([place])   . Ventral hernia s/p laparoscopic lysis of adhesions and hernia repair with mesh 07/29/11 08/01/2011   Review of Systems:   Review of Systems  Constitutional: Negative for chills, fever and malaise/fatigue.   Musculoskeletal: Positive for back pain, myalgias and neck pain.    Physical Exam:  Vitals:   01/18/19 1323  BP: 132/73  Pulse: 81  Temp: 98.1 F (36.7 C)  TempSrc: Oral  SpO2: 100%  Weight: 180 lb 8 oz (81.9 kg)  Height: 5\' 4"  (1.626 m)    General: well-appearing female in no acute distress  Ext: RLE is warm to the touch and edematous, there are several excoriations on the lateral side of leg with associated erythema       Assessment & Plan:   See Encounters Tab for problem based charting.  Patient discussed with Dr. Dareen Piano

## 2019-01-23 NOTE — Progress Notes (Signed)
Internal Medicine Clinic Attending  Case discussed with Dr. Santos-Sanchez at the time of the visit.  We reviewed the resident's history and exam and pertinent patient test results.  I agree with the assessment, diagnosis, and plan of care documented in the resident's note.    

## 2019-01-25 ENCOUNTER — Telehealth: Payer: Self-pay | Admitting: Licensed Clinical Social Worker

## 2019-01-25 ENCOUNTER — Ambulatory Visit: Payer: PPO

## 2019-01-25 NOTE — Telephone Encounter (Signed)
Patient was called to offer a sooner appointment. Patient reported that she is going to keep her appointment at this time, and will call if she needs a sooner appointment.

## 2019-01-26 NOTE — Telephone Encounter (Signed)
Called patient yesterday. Patient wanted to keep her appointment in December, and understands that she can move it to a sooner date if necessary.

## 2019-01-27 ENCOUNTER — Other Ambulatory Visit: Payer: Self-pay

## 2019-01-27 ENCOUNTER — Encounter: Payer: Self-pay | Admitting: Internal Medicine

## 2019-01-27 ENCOUNTER — Ambulatory Visit (HOSPITAL_COMMUNITY)
Admission: RE | Admit: 2019-01-27 | Discharge: 2019-01-27 | Disposition: A | Discharge status: Home / Self Care | Payer: PPO | Source: Ambulatory Visit | Attending: Internal Medicine | Admitting: Internal Medicine

## 2019-01-27 ENCOUNTER — Ambulatory Visit (INDEPENDENT_AMBULATORY_CARE_PROVIDER_SITE_OTHER): Payer: PPO | Admitting: Internal Medicine

## 2019-01-27 VITALS — BP 134/84 | HR 93 | Temp 98.2°F | Ht 64.0 in | Wt 181.7 lb

## 2019-01-27 DIAGNOSIS — I4891 Unspecified atrial fibrillation: Secondary | ICD-10-CM

## 2019-01-27 DIAGNOSIS — X58XXXA Exposure to other specified factors, initial encounter: Secondary | ICD-10-CM

## 2019-01-27 DIAGNOSIS — Z7901 Long term (current) use of anticoagulants: Secondary | ICD-10-CM | POA: Diagnosis not present

## 2019-01-27 DIAGNOSIS — S81801A Unspecified open wound, right lower leg, initial encounter: Secondary | ICD-10-CM

## 2019-01-27 NOTE — Assessment & Plan Note (Addendum)
Patient presents for follow-up of RLE wound.  She was seen last week for RLE swelling and was found to have an infected wound.  She was treated for skin and soft tissue infection with doxycycline x7 days as well as hydrocolloid patches.  Her wound is now healed, but she continues to have nonpitting edema, tightening of the skin, and significant warmth to the touch.  She denies fevers and chills.  She is able to ambulate, but has noticed numbness below the knee as well as change in color of her toes. DP pulse is weak.  Low suspicion for a VTE given a well score of 0 + she is on Xarelto for A. Fib.  However, given unclear etiology of edema and warmth we will proceed with a Doppler ultrasound.  Will also obtain CK and CRP. Main concern would be for compartment syndrome given paresthesia and decreased DP pulse.  Update: US Doppler US negative for clot per tech. Will follow up formal results. ABIs 1.19 bilaterally. Patient was sent home with specific return precautions. Advised to call with any questions she has. Will follow up CK and CRP.

## 2019-01-27 NOTE — Progress Notes (Signed)
Lower extremity venous has been completed.   Preliminary results in CV Proc.   Abram Sander 01/27/2019 11:57 AM

## 2019-01-27 NOTE — Progress Notes (Signed)
   CC: RLE wound follow up  HPI:  [name] is a 68 y.o. year-old female with PMH listed below who presents to clinic for right lower extremity wound follow-up. Please see problem based assessment and plan for further details.   Past Medical History:  Diagnosis Date  . Abnormal CT scan 03/16/2017  . Atrial fibrillation ([place])   . Bunion   . Carotid stenosis   . Carpal tunnel syndrome    mild   . Cervical arthritis   . Cholecystitis    s/p cholecystectomy  . Chronic back pain   . Chronic gastritis   . Depression   . Diabetic peripheral neuropathy ([place])   . Diarrhea 04/05/2015  . Digoxin toxicity    08/2011  . Diverticulitis    dx 04/08/14  . Diverticulosis   . Dysphagia    no documented strictures but responded positively to dilation in past.   . Epileptic seizure, tonic ([place])    .No meds since age of 70.  . Family history of adverse reaction to anesthesia    "my mother may have"  . Fatty liver   . Fibromyalgia   . GERD (gastroesophageal reflux disease)   . H/O hiatal hernia    S/P hernia repair  . Hyperlipidemia   . Hypertension   . Hypothyroid   . Internal hemorrhoid   . Joint pain 05/08/2011  . Left knee pain 12/19/2014  . Migraine   . Mild cognitive impairment   . Muscle spasm of back 08/10/2013  . Nonspecific (abnormal) findings on radiological and other examination of gastrointestinal tract 08/07/2011  . OSA (obstructive sleep apnea) dx'd 2003   "can't sleep w/that mask; lost some weight; maybe that's helped" (04/26/2014)  . Osteoarthritis   . Pneumonia X 2  . Right foot pain 08/03/2013  . Sarcoidosis   . Skin neoplasm   . TIA (transient ischemic attack)    "don't know when I had it" (04/26/2014)  . Transaminase or LDH elevation   . Type II diabetes mellitus ([place])   . Ventral hernia s/p laparoscopic lysis of adhesions and hernia repair with mesh 07/29/11 08/01/2011   Review of Systems:   Review of Systems  Constitutional: Negative for chills, fever  and malaise/fatigue.  Musculoskeletal: Negative for joint pain.       Numbness of RLE    Physical Exam:  Vitals:   01/27/19 1033 01/27/19 1035  BP: 140/82 134/84  Pulse: 93   Temp: 98.2 F (36.8 C)   TempSrc: Oral   SpO2: 100%   Weight: 181 lb 11.2 oz (82.4 kg)   Height: 5\' 4"  (1.626 m)     General: Well-appearing female in no acute distress MSK: RLE appears improved from last week.  There is no erythema and wound has healed.  There is nonpitting edema across calf with tightening of the skin as well as warmth to the touch.  There is also numbness below the knee and weak DP pulse.   Assessment & Plan:   See Encounters Tab for problem based charting.  Patient seen with Dr. Rebeca Alert

## 2019-01-27 NOTE — Patient Instructions (Signed)
[name],   We are not sure what is causing the swelling and numbness in your right leg.  We did an ultrasound of your leg and you do not have a clot.  This is a good thing.  We check the vessels in your leg as well your results were normal.  We also did blood work but this will take 24 hours to come back.  We will wait for this blood work to know more.  If you continue to have numbness in your pain worsens or if you notice a blue discoloration in your foot either call the clinic or go straight to the ED for further evaluation.  - Dr. Frederico Hamman

## 2019-01-28 LAB — CK: Total CK: 90 U/L (ref 32–182)

## 2019-01-28 LAB — C-REACTIVE PROTEIN: CRP: 2 mg/L (ref 0–10)

## 2019-01-30 NOTE — Progress Notes (Signed)
Internal Medicine Clinic Attending  I saw and evaluated the patient.  I personally confirmed the key portions of the history and exam documented by Dr. Isac Sarna and I reviewed pertinent patient test results.  The assessment, diagnosis, and plan were formulated together and I agree with the documentation in the resident's note.  Here for follow up of right leg wound, which has healed quite well. However, exam is notable for firm, tight, non-pitting swelling and warmth of right calf in region of wound. Sensation diminished distally, cap refill intact, but more pale and cool distally. Concern for PAD, compartment syndrome, DVT. STAT doppler US negative for DVT, ABI reassuring, CK normal, and CRP normal suggests against occult infection. Dr. Isac Sarna reviewed return precautions, will reassess after complete healing of wound.  Lenice Pressman, M.D., Ph.D.

## 2019-02-09 ENCOUNTER — Ambulatory Visit: Payer: PPO | Admitting: Licensed Clinical Social Worker

## 2019-02-14 ENCOUNTER — Ambulatory Visit (INDEPENDENT_AMBULATORY_CARE_PROVIDER_SITE_OTHER): Payer: PPO | Admitting: Internal Medicine

## 2019-02-14 VITALS — BP 141/83 | HR 72 | Temp 99.0°F | Wt 178.1 lb

## 2019-02-14 DIAGNOSIS — Z880 Allergy status to penicillin: Secondary | ICD-10-CM

## 2019-02-14 DIAGNOSIS — Z7901 Long term (current) use of anticoagulants: Secondary | ICD-10-CM

## 2019-02-14 DIAGNOSIS — L0390 Cellulitis, unspecified: Secondary | ICD-10-CM | POA: Insufficient documentation

## 2019-02-14 DIAGNOSIS — L03115 Cellulitis of right lower limb: Secondary | ICD-10-CM | POA: Diagnosis not present

## 2019-02-14 MED ORDER — DOXYCYCLINE HYCLATE 100 MG PO TABS: 100.0000 mg | ORAL_TABLET | Freq: Two times a day (BID) | ORAL | 0 refills | Status: AC

## 2019-02-14 NOTE — Assessment & Plan Note (Signed)
Patient presents the clinic with 3 to 4 days of right lower extremity pain, swelling, and redness. She previously had a laceration on his location and had cut herself with a razor. She states that it has become painful to walk. She is not had any systemic signs of infection including fevers. She otherwise has felt well. She is on Xarelto and has been taking it consistently.  On physical exam there is erythema, tenderness to palpation, and swelling of the right lower extremity. Negative Hoffman sign. The right calf measures 14.25 cm compared to the left at 14 cm.  A/P: - Consistent with purulent cellulitis. Has a history of hives with penicillin. We will therefore put her on doxycycline 100 mg twice daily for five days. She will call in if her symptoms do not improve.

## 2019-02-14 NOTE — Progress Notes (Signed)
   CC: RLE pain  HPI:  [name] is a 68 y.o. female with PMHx listed below presenting for RLE pain. Please see the A&P for the status of the patient's chronic medical problems.  Past Medical History:  Diagnosis Date  . Abnormal CT scan 03/16/2017  . Atrial fibrillation ([place])   . Bunion   . Carotid stenosis   . Carpal tunnel syndrome    mild   . Cervical arthritis   . Cholecystitis    s/p cholecystectomy  . Chronic back pain   . Chronic gastritis   . Depression   . Diabetic peripheral neuropathy ([place])   . Diarrhea 04/05/2015  . Digoxin toxicity    08/2011  . Diverticulitis    dx 04/08/14  . Diverticulosis   . Dysphagia    no documented strictures but responded positively to dilation in past.   . Epileptic seizure, tonic ([place])    .No meds since age of 89.  . Family history of adverse reaction to anesthesia    "my mother may have"  . Fatty liver   . Fibromyalgia   . GERD (gastroesophageal reflux disease)   . H/O hiatal hernia    S/P hernia repair  . Hyperlipidemia   . Hypertension   . Hypothyroid   . Internal hemorrhoid   . Joint pain 05/08/2011  . Left knee pain 12/19/2014  . Migraine   . Mild cognitive impairment   . Muscle spasm of back 08/10/2013  . Nonspecific (abnormal) findings on radiological and other examination of gastrointestinal tract 08/07/2011  . OSA (obstructive sleep apnea) dx'd 2003   "can't sleep w/that mask; lost some weight; maybe that's helped" (04/26/2014)  . Osteoarthritis   . Pneumonia X 2  . Right foot pain 08/03/2013  . Sarcoidosis   . Skin neoplasm   . TIA (transient ischemic attack)    "don't know when I had it" (04/26/2014)  . Transaminase or LDH elevation   . Type II diabetes mellitus ([place])   . Ventral hernia s/p laparoscopic lysis of adhesions and hernia repair with mesh 07/29/11 08/01/2011   Review of Systems:  Performed and all others negative.  Physical Exam: Vitals:   02/14/19 1341  BP: (!) 141/83  Pulse: 72  Temp:  99 F (37.2 C)  SpO2: 99%  Weight: 178 lb 1.6 oz (80.8 kg)   General: Well nourished female in no acute distress Pulm: Good air movement with no wheezing or crackles  CV: RRR, no murmurs, no rubs   Extremities: Erythema, tenderness to palpation, and swelling of the right lower extremity. Negative Hoffman sign. Measures 14.25 cm compared to the left at 14 cm.  Assessment & Plan:   See Encounters Tab for problem based charting.  Patient discussed with Dr. Dareen Piano

## 2019-02-14 NOTE — Patient Instructions (Signed)
Thank you for allowing me to provide your care. I think that you of infection in the skin on your right leg. I'm sending out some antibiotics. You'll take this for the next five days. If you feel that your swelling or pain are getting worse please call me and we will send you for an ultrasound to rule out a blood clot in that leg. If you develop any fevers, inability to eat or drink, or severe pain please seek medical attention immediately.

## 2019-02-15 NOTE — Progress Notes (Signed)
Internal Medicine Clinic Attending  Case discussed with Dr. Helberg at the time of the visit.  We reviewed the resident's history and exam and pertinent patient test results.  I agree with the assessment, diagnosis, and plan of care documented in the resident's note.    

## 2019-02-16 ENCOUNTER — Other Ambulatory Visit: Payer: Self-pay

## 2019-02-16 ENCOUNTER — Ambulatory Visit (INDEPENDENT_AMBULATORY_CARE_PROVIDER_SITE_OTHER): Payer: PPO | Admitting: Licensed Clinical Social Worker

## 2019-02-16 ENCOUNTER — Encounter: Payer: Self-pay | Admitting: Licensed Clinical Social Worker

## 2019-02-16 DIAGNOSIS — F4323 Adjustment disorder with mixed anxiety and depressed mood: Secondary | ICD-10-CM

## 2019-02-16 NOTE — BH Specialist Note (Signed)
Integrated Behavioral Health Visit via Telemedicine (Telephone)  02/16/2019 [name] [name] [id number]   Session Start time: 9:35  Session End time: 10:05 Total time: 30 minutes  Referring Provider: Dr. Tarri Abernethy Type of Visit: Telephonic Patient location: Home [facility] Provider location: Office All persons participating in visit: Patient and [facility]  Confirmed patient's address: Yes  Confirmed patient's phone number: Yes  Any changes to demographics: No   Discussed confidentiality: Yes    The following statements were read to the patient and/or legal guardian that are established with the [facility] Provider.  "The purpose of this phone visit is to provide behavioral health care while limiting exposure to the coronavirus (COVID19).  There is a possibility of technology failure and discussed alternative modes of communication if that failure occurs."  "By engaging in this telephone visit, you consent to the provision of healthcare.  Additionally, you authorize for your insurance to be billed for the services provided during this telephone visit."   Patient and/or legal guardian consented to telephone visit: Yes   PRESENTING CONCERNS: Patient and/or family reports the following symptoms/concerns: recent grief, moderate levels of anxiety, depression Duration of problem:over three months ; Severity of problem: moderate   GOALS ADDRESSED: Patient will: 1.  Reduce symptoms of: anxiety, depression, stress and grief.  2.  Increase knowledge and/or ability of: coping skills, healthy habits and stress reduction  3.  Demonstrate ability to: Increase healthy adjustment to current life circumstances, Increase adequate support systems for patient/family and Begin healthy grieving over loss  INTERVENTIONS: Interventions utilized:  Mindfulness or Relaxation Training, Behavioral Activation, Brief CBT and Supportive Counseling Standardized Assessments completed: assessed for SI, HI, and  self-harm.  ASSESSMENT: Patient currently experiencing ongoing moderate levels of anxiety and depression due to recent loss and life changes. Patient is continuing to grieve the loss of her daughter.  Patient practiced remembering the positives of her daughter. Patient's mood has improved, and she was able to identify happy moments in her life over the past several weeks.   Patient may benefit from counseling.  PLAN: 1. Follow up with behavioral health clinician on : three weeks.  Dessie Coma, [facility], [place]

## 2019-02-20 ENCOUNTER — Ambulatory Visit (INDEPENDENT_AMBULATORY_CARE_PROVIDER_SITE_OTHER): Payer: PPO | Admitting: Internal Medicine

## 2019-02-20 ENCOUNTER — Other Ambulatory Visit: Payer: Self-pay

## 2019-02-20 VITALS — BP 120/70 | HR 98 | Temp 98.0°F | Ht 63.0 in | Wt 181.2 lb

## 2019-02-20 DIAGNOSIS — X58XXXS Exposure to other specified factors, sequela: Secondary | ICD-10-CM

## 2019-02-20 DIAGNOSIS — M79604 Pain in right leg: Secondary | ICD-10-CM | POA: Diagnosis not present

## 2019-02-20 DIAGNOSIS — E119 Type 2 diabetes mellitus without complications: Secondary | ICD-10-CM

## 2019-02-20 DIAGNOSIS — Z23 Encounter for immunization: Secondary | ICD-10-CM | POA: Diagnosis not present

## 2019-02-20 DIAGNOSIS — I4891 Unspecified atrial fibrillation: Secondary | ICD-10-CM | POA: Diagnosis not present

## 2019-02-20 DIAGNOSIS — S81801S Unspecified open wound, right lower leg, sequela: Secondary | ICD-10-CM

## 2019-02-20 DIAGNOSIS — I10 Essential (primary) hypertension: Secondary | ICD-10-CM | POA: Diagnosis not present

## 2019-02-20 MED ORDER — NAPROXEN 500 MG PO TBEC: 500.0000 mg | DELAYED_RELEASE_TABLET | Freq: Two times a day (BID) | ORAL | 0 refills | Status: AC | PRN

## 2019-02-20 NOTE — Progress Notes (Signed)
   CC: Right lower extremity pain   HPI:  [name] is a 68 y.o. with essential htn, afib, dm2 who presents for follow up of right leg pain. Please see problem based charting for evaluation, assessment, and plan.  Past Medical History:  Diagnosis Date  . Abnormal CT scan 03/16/2017  . Atrial fibrillation ([place])   . Bunion   . Carotid stenosis   . Carpal tunnel syndrome    mild   . Cervical arthritis   . Cholecystitis    s/p cholecystectomy  . Chronic back pain   . Chronic gastritis   . Depression   . Diabetic peripheral neuropathy ([place])   . Diarrhea 04/05/2015  . Digoxin toxicity    08/2011  . Diverticulitis    dx 04/08/14  . Diverticulosis   . Dysphagia    no documented strictures but responded positively to dilation in past.   . Epileptic seizure, tonic ([place])    .No meds since age of 8.  . Family history of adverse reaction to anesthesia    "my mother may have"  . Fatty liver   . Fibromyalgia   . GERD (gastroesophageal reflux disease)   . H/O hiatal hernia    S/P hernia repair  . Hyperlipidemia   . Hypertension   . Hypothyroid   . Internal hemorrhoid   . Joint pain 05/08/2011  . Left knee pain 12/19/2014  . Migraine   . Mild cognitive impairment   . Muscle spasm of back 08/10/2013  . Nonspecific (abnormal) findings on radiological and other examination of gastrointestinal tract 08/07/2011  . OSA (obstructive sleep apnea) dx'd 2003   "can't sleep w/that mask; lost some weight; maybe that's helped" (04/26/2014)  . Osteoarthritis   . Pneumonia X 2  . Right foot pain 08/03/2013  . Sarcoidosis   . Skin neoplasm   . TIA (transient ischemic attack)    "don't know when I had it" (04/26/2014)  . Transaminase or LDH elevation   . Type II diabetes mellitus ([place])   . Ventral hernia s/p laparoscopic lysis of adhesions and hernia repair with mesh 07/29/11 08/01/2011   Review of Systems:   Review of Systems  Constitutional: Negative for chills and fever.    Respiratory: Negative for cough and shortness of breath.   Gastrointestinal: Positive for nausea. Negative for vomiting.   Physical Exam:  Vitals:   02/20/19 1441  BP: 120/70  Pulse: 98  Temp: 98 F (36.7 C)  TempSrc: Oral  SpO2: 90%  Weight: 181 lb 3.2 oz (82.2 kg)  Height: 5\' 3"  (1.6 m)   Physical Exam  Constitutional: She appears well-developed and well-nourished. No distress.  HENT:  Head: Normocephalic and atraumatic.  Eyes: Conjunctivae are normal.  Respiratory: Effort normal and breath sounds normal. No respiratory distress. She has no wheezes.  GI: Soft. Bowel sounds are normal. She exhibits no distension. There is no abdominal tenderness.  Musculoskeletal:        General: Tenderness (to palpation of right lower extremity ) present. No edema.     Comments: Rle swelling, erythematous, no warmth to touch. Firm rle to mid calf in comparison to left.   Neurological: She is alert.  Skin: She is not diaphoretic. No erythema.  Psychiatric: She has a normal mood and affect. Her behavior is normal. Judgment and thought content normal.        Assessment & Plan:   See Encounters Tab for problem based charting.  Patient discussed with Dr. Heber Chackbay

## 2019-02-20 NOTE — Patient Instructions (Signed)
It was a pleasure to see you today [name]. Please make the following changes:  I am sorry to hear about your leg pain. We have ordered an mri for further evaluation. I have prescribed naproxen for pain relief. Follow up in 1-2 weeks.  If you have any questions or concerns, please call our clinic at [phone] between 9am-5pm and after hours call [phone] and ask for the internal medicine resident on call. If you feel you are having a medical emergency please call 911.   Thank you, we look forward to help you remain healthy!  Lars Mage, MD Internal Medicine PGY3

## 2019-02-21 LAB — SEDIMENTATION RATE: Sed Rate: 8 mm/hr (ref 0–40)

## 2019-02-22 NOTE — Progress Notes (Signed)
Internal Medicine Clinic Attending  I saw and evaluated the patient.  I personally confirmed the key portions of the history and exam documented by Dr. Maricela Bo and I reviewed pertinent patient test results.  The assessment, diagnosis, and plan were formulated together and I agree with the documentation in the resident's note.    Patient presetned with persistent swelling and pain in the RLE.  Workup has included ABI (normal), U/S duplex (no DVT), and recent course of antibiotics without significant improvement.  On my exam she clearly has increased swelling of the right compared to the left, however no increased warmth, there is some blanching of the skin when pressed.  No focal induration, good ROM.  POCUS did not reveal any focal areas of fluid collection and no baker's cyst noted.  I am not exactly sure what has cause her persistent symptoms. We will try to see if we can obtain an MRI to further evaluate.  I did ask her to call/come in if worsening pain, ascending redness, or fever.

## 2019-02-22 NOTE — Assessment & Plan Note (Signed)
Patient presented for re-evaluation of right sided lower extremity pain. The pain has been present for 1-2 months now. It started after she scraped her leg on a wooden picture frame.   At last visit she was treated for purulent cellulitis with doxycycline 179m bid for 5 days which helped decrease inflammation slightly. She states that she continues to have severe pain that makes it difficult for her to ambulate. She had been taking tylenol 5033mupto six times daily which has not helped. She has accompanied feeling of her right toes being numb. She is having to be up on her feet helping her husband with his medical issues.   Assessment and plan  Patient's right lower extremity is mildly inflammed, firm, appears erythematous, tender to palpation. No warmth to touch. POCUS without presence of effusion, abscess, or presence of dvt. Will do MRI of tibia and fibula for further evaluation of musculature and soft tissue. Does not have fever, chills, or other signs of infection that necessitates antibiotics at this time. Will check esr.

## 2019-03-06 ENCOUNTER — Ambulatory Visit: Payer: PPO

## 2019-03-09 ENCOUNTER — Ambulatory Visit (HOSPITAL_COMMUNITY)
Admission: RE | Admit: 2019-03-09 | Discharge: 2019-03-09 | Disposition: A | Discharge status: Home / Self Care | Payer: PPO | Source: Ambulatory Visit | Attending: Internal Medicine | Admitting: Internal Medicine

## 2019-03-09 ENCOUNTER — Other Ambulatory Visit: Payer: Self-pay

## 2019-03-09 DIAGNOSIS — R2241 Localized swelling, mass and lump, right lower limb: Secondary | ICD-10-CM | POA: Diagnosis not present

## 2019-03-09 DIAGNOSIS — S81801S Unspecified open wound, right lower leg, sequela: Secondary | ICD-10-CM | POA: Diagnosis not present

## 2019-03-09 MED ORDER — GADOBUTROL 1 MMOL/ML IV SOLN
8.0000 mL | Freq: Once | INTRAVENOUS | Status: AC | PRN
  Administered 2019-03-09: 8 mL via INTRAVENOUS

## 2019-03-13 ENCOUNTER — Encounter: Payer: Self-pay | Admitting: Internal Medicine

## 2019-03-13 ENCOUNTER — Other Ambulatory Visit: Payer: Self-pay

## 2019-03-13 ENCOUNTER — Ambulatory Visit (INDEPENDENT_AMBULATORY_CARE_PROVIDER_SITE_OTHER): Payer: PPO | Admitting: Internal Medicine

## 2019-03-13 VITALS — BP 122/75 | HR 75 | Temp 98.2°F | Ht 63.0 in | Wt 177.0 lb

## 2019-03-13 DIAGNOSIS — I4891 Unspecified atrial fibrillation: Secondary | ICD-10-CM

## 2019-03-13 DIAGNOSIS — S81801D Unspecified open wound, right lower leg, subsequent encounter: Secondary | ICD-10-CM

## 2019-03-13 DIAGNOSIS — I480 Paroxysmal atrial fibrillation: Secondary | ICD-10-CM

## 2019-03-13 DIAGNOSIS — Z7901 Long term (current) use of anticoagulants: Secondary | ICD-10-CM

## 2019-03-13 DIAGNOSIS — F329 Major depressive disorder, single episode, unspecified: Secondary | ICD-10-CM

## 2019-03-13 DIAGNOSIS — E785 Hyperlipidemia, unspecified: Secondary | ICD-10-CM

## 2019-03-13 DIAGNOSIS — Z79899 Other long term (current) drug therapy: Secondary | ICD-10-CM | POA: Diagnosis not present

## 2019-03-13 DIAGNOSIS — E119 Type 2 diabetes mellitus without complications: Secondary | ICD-10-CM | POA: Diagnosis not present

## 2019-03-13 DIAGNOSIS — I10 Essential (primary) hypertension: Secondary | ICD-10-CM

## 2019-03-13 DIAGNOSIS — F32A Depression, unspecified: Secondary | ICD-10-CM

## 2019-03-13 DIAGNOSIS — Z79890 Hormone replacement therapy (postmenopausal): Secondary | ICD-10-CM | POA: Diagnosis not present

## 2019-03-13 DIAGNOSIS — F339 Major depressive disorder, recurrent, unspecified: Secondary | ICD-10-CM | POA: Diagnosis not present

## 2019-03-13 DIAGNOSIS — E039 Hypothyroidism, unspecified: Secondary | ICD-10-CM | POA: Diagnosis not present

## 2019-03-13 DIAGNOSIS — I482 Chronic atrial fibrillation, unspecified: Secondary | ICD-10-CM

## 2019-03-13 DIAGNOSIS — M7989 Other specified soft tissue disorders: Secondary | ICD-10-CM

## 2019-03-13 LAB — POCT GLYCOSYLATED HEMOGLOBIN (HGB A1C): Hemoglobin A1C: 9.7 % — AB (ref 4.0–5.6)

## 2019-03-13 LAB — GLUCOSE, CAPILLARY: Glucose-Capillary: 295 mg/dL — ABNORMAL HIGH (ref 70–99)

## 2019-03-13 MED ORDER — RIVAROXABAN 20 MG PO TABS: ORAL_TABLET | ORAL | 1 refills | Status: DC

## 2019-03-13 MED ORDER — LOSARTAN POTASSIUM 25 MG PO TABS: 25.0000 mg | ORAL_TABLET | Freq: Every day | ORAL | 3 refills | Status: AC

## 2019-03-13 MED ORDER — ROSUVASTATIN CALCIUM 20 MG PO TABS: 20.0000 mg | ORAL_TABLET | Freq: Every day | ORAL | 3 refills | Status: AC

## 2019-03-13 MED ORDER — LEVOTHYROXINE SODIUM 50 MCG PO TABS: 50.0000 ug | ORAL_TABLET | Freq: Every morning | ORAL | 3 refills | Status: AC

## 2019-03-13 MED ORDER — DILTIAZEM HCL ER COATED BEADS 180 MG PO CP24: 180.0000 mg | ORAL_CAPSULE | Freq: Every day | ORAL | 3 refills | Status: DC

## 2019-03-13 MED ORDER — PREDNISONE 20 MG PO TABS: 20.0000 mg | ORAL_TABLET | Freq: Every day | ORAL | 0 refills | Status: DC

## 2019-03-13 MED ORDER — METFORMIN HCL ER 500 MG PO TB24: ORAL_TABLET | ORAL | 0 refills | Status: DC

## 2019-03-13 MED ORDER — DULOXETINE HCL 60 MG PO CPEP: 60.0000 mg | ORAL_CAPSULE | Freq: Every day | ORAL | 3 refills | Status: AC

## 2019-03-13 MED ORDER — ATENOLOL 25 MG PO TABS: 37.5000 mg | ORAL_TABLET | Freq: Every day | ORAL | 3 refills | Status: DC

## 2019-03-13 NOTE — Assessment & Plan Note (Signed)
Patient presents for continued evaluation of right lower extremity pain. Start approximately eight weeks ago. She initially cut her legs on some picture frames. This healed however she presented to clinic with erythema, tenderness, and purulent discharge. She was treated with doxycycline which minimally helped. Her pain continued to persist and she subsequently presented back to clinic. Upon that evaluation bedside ultrasound did not illustrate any evidence of infection or DVT. She has been taking her Xarelto as prescribed. An MRI was obtained that showed some subcutaneous fat necrosis. She continues to have pain that is unrelieved by over-the-counter medications including Tylenol. The pain is worse after resting. It does improved with increased activity. We discussed the results of her MRI.  A/P: - Possible she could benefit from an injection. Sometimes dermatology will do these. We will refer her for dermatology consultation

## 2019-03-13 NOTE — Assessment & Plan Note (Signed)
Patient with known hypothyroidism. Last TSH was within normal limits. We will continue levothyroxine 50 mcg per day.

## 2019-03-13 NOTE — Patient Instructions (Addendum)
Thank you for allowing [place] to provide your care. 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 This can take a couple months to improve. You can take over-the-counter medications as needed to help with the pain. We will also be referring you on to dermatology who can sometimes treat this pain with injections.  Please come back to see me in three months or sooner if any issues arise.

## 2019-03-13 NOTE — Assessment & Plan Note (Addendum)
Patient with previously well controlled type II diabetes. Her A1c today has increased from 6.3 to 9.2. She states that she has not been eating as she typically has. She has been gaining weight. She is not been exercising due to right lower extremity pain. She was previously on metformin but was able to come off this. She states that metformin did give her diarrhea however she does not want to be on any medications that could cause weight gain.  I reviewed her most recent blood work that showed stable renal function with a GFR greater than 40.  A/P: - We will start metformin today with a self increasing protocol. She will follow-up in three months. If her A1c remains above goal we will discuss starting and SLGT2 inhibitor versus GLP-1 agonist  A/P:

## 2019-03-13 NOTE — Assessment & Plan Note (Signed)
Patient with known depression. She is currently on duloxetine 60 mg daily. She continues to have a significant amount of life stressors. She is following with [name] for counseling. She feels that her mood is doing well. She has good support at home. She denies SI or HI.

## 2019-03-13 NOTE — Assessment & Plan Note (Signed)
Patient needs a refill on her rosuvastatin. Refills have been sent to the pharmacy.

## 2019-03-13 NOTE — Assessment & Plan Note (Signed)
Patient with well-controlled hypertension. She is currently on diltiazem 180 mg daily, atenolol 37.5 mg daily, and losartan 25 mg daily. She is taking all these medications as prescribed. She is not experiencing any adverse effects. She denies orthostatic symptoms. She needs refills on all her medications.  Her basic metabolic panel from [id number] was reviewed with normal renal function. She was slightly hypokalemia.  A/P: - Continue diltiazem 180 mg daily, atenolol 37.5 mg daily, and losartan 25 mg daily.

## 2019-03-13 NOTE — Assessment & Plan Note (Signed)
Patient with paroxysmal atrial fibrillation. She is rate controlled with atenolol 37.5 mg daily. She is anticoagulated with Xarelto. No adverse effects or signs of bleeding. She needs refills on both these medications.  A/P: - Continue atenolol 37.5 mg daily and Xarelto 20 mg daily

## 2019-03-13 NOTE — Progress Notes (Signed)
   CC: Right leg pain  HPI:  [name] is a 69 y.o. female with PMHx listed below presenting for right leg pain. Please see the A&P for the status of the patient's chronic medical problems.  Past Medical History:  Diagnosis Date  . Abnormal CT scan 03/16/2017  . Atrial fibrillation ([place])   . Bunion   . Carotid stenosis   . Carpal tunnel syndrome    mild   . Cervical arthritis   . Cholecystitis    s/p cholecystectomy  . Chronic back pain   . Chronic gastritis   . Depression   . Diabetic peripheral neuropathy ([place])   . Diarrhea 04/05/2015  . Digoxin toxicity    08/2011  . Diverticulitis    dx 04/08/14  . Diverticulosis   . Dysphagia    no documented strictures but responded positively to dilation in past.   . Epileptic seizure, tonic ([place])    .No meds since age of 50.  . Family history of adverse reaction to anesthesia    "my mother may have"  . Fatty liver   . Fibromyalgia   . GERD (gastroesophageal reflux disease)   . H/O hiatal hernia    S/P hernia repair  . Hyperlipidemia   . Hypertension   . Hypothyroid   . Internal hemorrhoid   . Joint pain 05/08/2011  . Left knee pain 12/19/2014  . Migraine   . Mild cognitive impairment   . Muscle spasm of back 08/10/2013  . Nonspecific (abnormal) findings on radiological and other examination of gastrointestinal tract 08/07/2011  . OSA (obstructive sleep apnea) dx'd 2003   "can't sleep w/that mask; lost some weight; maybe that's helped" (04/26/2014)  . Osteoarthritis   . Pneumonia X 2  . Right foot pain 08/03/2013  . Sarcoidosis   . Skin neoplasm   . TIA (transient ischemic attack)    "don't know when I had it" (04/26/2014)  . Transaminase or LDH elevation   . Type II diabetes mellitus ([place])   . Ventral hernia s/p laparoscopic lysis of adhesions and hernia repair with mesh 07/29/11 08/01/2011   Review of Systems:  Performed and all others negative.  Physical Exam: Vitals:   03/13/19 1323  BP: 122/75  Pulse: 75    Temp: 98.2 F (36.8 C)  TempSrc: Oral  SpO2: 97%  Weight: 177 lb (80.3 kg)  Height: 5\' 3"  (1.6 m)   General: Well nourished feamle in no acute distress Pulm: Good air movement with no wheezing or crackles  CV: RRR, no murmurs, no rubs   Assessment & Plan:   See Encounters Tab for problem based charting.  Patient discussed with Dr. Philipp Ovens

## 2019-03-14 NOTE — Progress Notes (Signed)
Internal Medicine Clinic Attending  Case discussed with Dr. Helberg at the time of the visit.  We reviewed the resident's history and exam and pertinent patient test results.  I agree with the assessment, diagnosis, and plan of care documented in the resident's note.    

## 2019-03-16 ENCOUNTER — Other Ambulatory Visit: Payer: Self-pay

## 2019-03-16 ENCOUNTER — Other Ambulatory Visit: Payer: Self-pay | Admitting: *Deleted

## 2019-03-16 ENCOUNTER — Encounter: Payer: Self-pay | Admitting: Licensed Clinical Social Worker

## 2019-03-16 ENCOUNTER — Ambulatory Visit (INDEPENDENT_AMBULATORY_CARE_PROVIDER_SITE_OTHER): Payer: PPO | Admitting: Licensed Clinical Social Worker

## 2019-03-16 DIAGNOSIS — F329 Major depressive disorder, single episode, unspecified: Secondary | ICD-10-CM

## 2019-03-16 DIAGNOSIS — F32A Depression, unspecified: Secondary | ICD-10-CM

## 2019-03-16 NOTE — BH Specialist Note (Signed)
Integrated Behavioral Health Visit via Telemedicine (Telephone)  03/16/2019 [name] [name] [id number]   Session Start time: 9:35  Session End time: 10:05 Total time: 74minutes  Referring Provider: Dr. Tarri Abernethy Type of Visit: Telephonic Patient location: Home [facility] Provider location: Office All persons participating in visit: Patient and [facility]  Confirmed patient's address: Yes  Confirmed patient's phone number: Yes  Any changes to demographics: No   Discussed confidentiality: Yes    The following statements were read to the patient and/or legal guardian that are established with the [facility] Provider.  "The purpose of this phone visit is to provide behavioral health care while limiting exposure to the coronavirus (COVID19).  There is a possibility of technology failure and discussed alternative modes of communication if that failure occurs."  "By engaging in this telephone visit, you consent to the provision of healthcare.  Additionally, you authorize for your insurance to be billed for the services provided during this telephone visit."   Patient and/or legal guardian consented to telephone visit: Yes   PRESENTING CONCERNS: Patient and/or family reports the following symptoms/concerns: grief, health issues, anxiety, sleep challenges, and depression. Duration of problem: increased over the past six months; Severity of problem: moderate  GOALS ADDRESSED: Patient will: 1.  Reduce symptoms of: agitation, anxiety, depression, insomnia and stress  2.  Increase knowledge and/or ability of: coping skills, healthy habits and stress reduction  3.  Demonstrate ability to: Increase healthy adjustment to current life circumstances, Increase adequate support systems for patient/family, Increase motivation to adhere to plan of care, Improve medication compliance and Begin healthy grieving over loss  INTERVENTIONS: Interventions utilized:  Mindfulness or Relaxation Training, Brief  CBT, Supportive Counseling, Medication Monitoring and Sleep Hygiene Standardized Assessments completed: assessed for SI, HI, and self-harm.  ASSESSMENT: Patient currently experiencing moderate anxiety and depression symptoms. Patient is continuing to grieve the loss of her daughter, and has days where she has challenges staying positive. Patient processed health concerns with herself and her spouse that have raised her anxiety. Patient is not sleeping well. Patient has questions regarding her medications, and a message was put in for her to speak with the triage nurse. Patient practiced grounding herself, and focusing on the positives.   Patient may benefit from counseling.  PLAN: 1. Follow up with behavioral health clinician on : one to two weeks.   Dessie Coma, [facility], [place]

## 2019-03-16 NOTE — Telephone Encounter (Signed)
-----   Message from [place], [facility] sent at 03/16/2019  9:48 AM EST ----- Regarding: patient needs a refill on her medication Patient needs a refill on her Hydroxyzine. Can you please call her?   Miquel Dunn

## 2019-03-17 ENCOUNTER — Other Ambulatory Visit: Payer: Self-pay

## 2019-03-17 ENCOUNTER — Emergency Department (HOSPITAL_COMMUNITY): Payer: PPO

## 2019-03-17 ENCOUNTER — Emergency Department (HOSPITAL_BASED_OUTPATIENT_CLINIC_OR_DEPARTMENT_OTHER)
Admission: EM | Admit: 2019-03-17 | Discharge: 2019-03-18 | Disposition: A | Discharge status: Home / Self Care | Payer: PPO | Attending: Emergency Medicine | Admitting: Emergency Medicine

## 2019-03-17 ENCOUNTER — Emergency Department (HOSPITAL_BASED_OUTPATIENT_CLINIC_OR_DEPARTMENT_OTHER): Payer: PPO

## 2019-03-17 ENCOUNTER — Encounter (HOSPITAL_BASED_OUTPATIENT_CLINIC_OR_DEPARTMENT_OTHER): Payer: Self-pay | Admitting: *Deleted

## 2019-03-17 DIAGNOSIS — E1140 Type 2 diabetes mellitus with diabetic neuropathy, unspecified: Secondary | ICD-10-CM | POA: Insufficient documentation

## 2019-03-17 DIAGNOSIS — Z03818 Encounter for observation for suspected exposure to other biological agents ruled out: Secondary | ICD-10-CM | POA: Diagnosis not present

## 2019-03-17 DIAGNOSIS — H81399 Other peripheral vertigo, unspecified ear: Secondary | ICD-10-CM | POA: Diagnosis not present

## 2019-03-17 DIAGNOSIS — I10 Essential (primary) hypertension: Secondary | ICD-10-CM | POA: Diagnosis not present

## 2019-03-17 DIAGNOSIS — E039 Hypothyroidism, unspecified: Secondary | ICD-10-CM | POA: Insufficient documentation

## 2019-03-17 DIAGNOSIS — R42 Dizziness and giddiness: Secondary | ICD-10-CM

## 2019-03-17 DIAGNOSIS — Z8673 Personal history of transient ischemic attack (TIA), and cerebral infarction without residual deficits: Secondary | ICD-10-CM | POA: Diagnosis not present

## 2019-03-17 DIAGNOSIS — R197 Diarrhea, unspecified: Secondary | ICD-10-CM | POA: Diagnosis not present

## 2019-03-17 DIAGNOSIS — Z20822 Contact with and (suspected) exposure to covid-19: Secondary | ICD-10-CM | POA: Insufficient documentation

## 2019-03-17 DIAGNOSIS — R0902 Hypoxemia: Secondary | ICD-10-CM | POA: Diagnosis not present

## 2019-03-17 DIAGNOSIS — R29810 Facial weakness: Secondary | ICD-10-CM | POA: Diagnosis not present

## 2019-03-17 DIAGNOSIS — Z7984 Long term (current) use of oral hypoglycemic drugs: Secondary | ICD-10-CM | POA: Insufficient documentation

## 2019-03-17 DIAGNOSIS — I447 Left bundle-branch block, unspecified: Secondary | ICD-10-CM | POA: Diagnosis not present

## 2019-03-17 DIAGNOSIS — U071 COVID-19: Secondary | ICD-10-CM | POA: Diagnosis not present

## 2019-03-17 DIAGNOSIS — R531 Weakness: Secondary | ICD-10-CM | POA: Diagnosis not present

## 2019-03-17 DIAGNOSIS — Z7901 Long term (current) use of anticoagulants: Secondary | ICD-10-CM | POA: Diagnosis not present

## 2019-03-17 DIAGNOSIS — Z79899 Other long term (current) drug therapy: Secondary | ICD-10-CM | POA: Insufficient documentation

## 2019-03-17 LAB — COMPREHENSIVE METABOLIC PANEL
ALT: 31 U/L (ref 0–44)
AST: 39 U/L (ref 15–41)
Albumin: 3.9 g/dL (ref 3.5–5.0)
Alkaline Phosphatase: 73 U/L (ref 38–126)
Anion gap: 9 (ref 5–15)
BUN: 10 mg/dL (ref 8–23)
CO2: 24 mmol/L (ref 22–32)
Calcium: 8.8 mg/dL — ABNORMAL LOW (ref 8.9–10.3)
Chloride: 105 mmol/L (ref 98–111)
Creatinine, Ser: 0.74 mg/dL (ref 0.44–1.00)
GFR calc Af Amer: >60 mL/min (ref >60)
GFR calc non Af Amer: >60 mL/min (ref >60)
Glucose, Bld: 85 mg/dL (ref 70–99)
Potassium: 3.5 mmol/L (ref 3.5–5.1)
Sodium: 138 mmol/L (ref 135–145)
Total Bilirubin: 1.7 mg/dL — ABNORMAL HIGH (ref 0.3–1.2)
Total Protein: 6.9 g/dL (ref 6.5–8.1)

## 2019-03-17 LAB — URINALYSIS, ROUTINE W REFLEX MICROSCOPIC
Bilirubin Urine: NEGATIVE
Glucose, UA: NEGATIVE mg/dL
Hgb urine dipstick: NEGATIVE
Ketones, ur: NEGATIVE mg/dL
Nitrite: NEGATIVE
Protein, ur: NEGATIVE mg/dL
Specific Gravity, Urine: 1.01 (ref 1.005–1.030)
pH: 7 (ref 5.0–8.0)

## 2019-03-17 LAB — URINALYSIS, MICROSCOPIC (REFLEX)

## 2019-03-17 LAB — CBC
HCT: 40.2 % (ref 36.0–46.0)
Hemoglobin: 13.7 g/dL (ref 12.0–15.0)
MCH: 31.8 pg (ref 26.0–34.0)
MCHC: 34.1 g/dL (ref 30.0–36.0)
MCV: 93.3 fL (ref 80.0–100.0)
Platelets: 193 10*3/uL (ref 150–400)
RBC: 4.31 MIL/uL (ref 3.87–5.11)
RDW: 12.2 % (ref 11.5–15.5)
WBC: 5.4 10*3/uL (ref 4.0–10.5)
nRBC: 0 % (ref 0.0–0.2)

## 2019-03-17 LAB — RESPIRATORY PANEL BY RT PCR (FLU A&B, COVID)
Influenza A by PCR: NEGATIVE
Influenza B by PCR: NEGATIVE
SARS Coronavirus 2 by RT PCR: NEGATIVE

## 2019-03-17 LAB — LIPASE, BLOOD: Lipase: 35 U/L (ref 11–51)

## 2019-03-17 MED ORDER — DIAZEPAM 5 MG PO TABS
5.0000 mg | ORAL_TABLET | Freq: Once | ORAL | Status: AC
  Administered 2019-03-18: 5 mg via ORAL
  Filled 2019-03-17: qty 1

## 2019-03-17 MED ORDER — MECLIZINE HCL 25 MG PO TABS
25.0000 mg | ORAL_TABLET | Freq: Once | ORAL | Status: AC
  Administered 2019-03-17: 25 mg via ORAL
  Filled 2019-03-17: qty 1

## 2019-03-17 MED ORDER — HYDROXYZINE HCL 50 MG PO TABS: 50.0000 mg | ORAL_TABLET | Freq: Every day | ORAL | 0 refills | Status: DC | PRN

## 2019-03-17 MED ORDER — ONDANSETRON HCL 4 MG/2ML IJ SOLN
4.0000 mg | Freq: Once | INTRAMUSCULAR | Status: AC | PRN
  Administered 2019-03-17: 4 mg via INTRAVENOUS
  Filled 2019-03-17: qty 2

## 2019-03-17 MED ORDER — SODIUM CHLORIDE 0.9 % IV BOLUS
500.0000 mL | Freq: Once | INTRAVENOUS | Status: AC
  Administered 2019-03-17: 500 mL via INTRAVENOUS

## 2019-03-17 NOTE — ED Notes (Signed)
Pt to transfer to [facility] ER for MRI

## 2019-03-17 NOTE — ED Notes (Signed)
MRI made aware that pt has arrived to [facility]

## 2019-03-17 NOTE — ED Provider Notes (Signed)
[place] EMERGENCY DEPARTMENT Provider Note   CSN: [id number] Arrival date & time: 03/17/19  1521     History Chief Complaint  Patient presents with  . Dizziness    [name] is a 69 y.o. female.  HPI Patient presents to the emergency department with dizziness that started last night prior to bed.  The patient states that she was laying down to go to bed when she started feeling dizzy like the room was spinning.  Patient states she got up in the middle the night to use the bathroom and noticed that she had difficulty walking and had to hold onto things in order to walk.  Patient states that this morning she woke up and had similar symptoms.  She states the symptoms have not stopped since she started with them last night.  The patient states that she did not take any medications prior to arrival for her symptoms.  The patient denies chest pain, shortness of breath, headache,blurred vision, neck pain, fever, cough, weakness, numbness, , anorexia, edema, abdominal pain, nausea, vomiting, diarrhea, rash, back pain, dysuria, hematemesis, bloody stool, near syncope, or syncope.    Past Medical History:  Diagnosis Date  . Abnormal CT scan 03/16/2017  . Atrial fibrillation ([place])   . Bunion   . Carotid stenosis   . Carpal tunnel syndrome    mild   . Cervical arthritis   . Cholecystitis    s/p cholecystectomy  . Chronic back pain   . Chronic gastritis   . Depression   . Diabetic peripheral neuropathy ([place])   . Diarrhea 04/05/2015  . Digoxin toxicity    08/2011  . Diverticulitis    dx 04/08/14  . Diverticulosis   . Dysphagia    no documented strictures but responded positively to dilation in past.   . Epileptic seizure, tonic ([place])    .No meds since age of 65.  . Family history of adverse reaction to anesthesia    "my mother may have"  . Fatty liver   . Fibromyalgia   . GERD (gastroesophageal reflux disease)   . H/O hiatal hernia    S/P hernia repair    . Hyperlipidemia   . Hypertension   . Hypothyroid   . Internal hemorrhoid   . Joint pain 05/08/2011  . Left knee pain 12/19/2014  . Migraine   . Mild cognitive impairment   . Muscle spasm of back 08/10/2013  . Nonspecific (abnormal) findings on radiological and other examination of gastrointestinal tract 08/07/2011  . OSA (obstructive sleep apnea) dx'd 2003   "can't sleep w/that mask; lost some weight; maybe that's helped" (04/26/2014)  . Osteoarthritis   . Pneumonia X 2  . Right foot pain 08/03/2013  . Sarcoidosis   . Skin neoplasm   . TIA (transient ischemic attack)    "don't know when I had it" (04/26/2014)  . Transaminase or LDH elevation   . Type II diabetes mellitus ([place])   . Ventral hernia s/p laparoscopic lysis of adhesions and hernia repair with mesh 07/29/11 08/01/2011    Patient Active Problem List   Diagnosis Date Noted  . Cellulitis 02/14/2019  . Subcutaneous fat necrosis 01/18/2019  . COVID-19 virus detected 11/07/2018  . Vitamin D deficiency 04/28/2014  . Poor dentition 04/03/2013  . Healthcare maintenance 03/31/2013  . Abnormality of gait 07/25/2012  . Headache 02/25/2012  . Chronic back pain 10/07/2011  . Financial difficulties 10/07/2011  . Hemorrhoids 10/06/2011  . Anemia 10/02/2011  . Diverticulosis  10/02/2011  . Depression 05/09/2009  . OBESITY, UNSPECIFIED 11/06/2008  . Carotid stenosis 11/06/2008  . FIBROMYALGIA, SEVERE 09/14/2008  . GERD 09/12/2008  . Osteopenia 06/22/2008  . Hyperlipidemia 06/30/2007  . TIA 06/01/2007  . DIABETIC PERIPHERAL NEUROPATHY 04/05/2006  . Diabetes type 2, controlled ([place]) 03/18/2006  . Hypothyroidism 12/21/2005  . Essential hypertension 12/21/2005  . Atrial fibrillation ([place]) 12/21/2005  . Osteoarthritis 12/21/2005    Past Surgical History:  Procedure Laterality Date  . ABDOMINAL HYSTERECTOMY  1980's   partial  . BILATERAL OOPHORECTOMY  1980's   "after partial hysterectomy"  . BREAST BIOPSY Right    benign  .  BREAST EXCISIONAL BIOPSY Right 03/2006   benign  . BREAST SURGERY Right ~ 2010   excision milk duct;  . BUNIONECTOMY Bilateral   . CATARACT EXTRACTION W/PHACO  10/27/2010   Procedure: CATARACT EXTRACTION PHACO AND INTRAOCULAR LENS PLACEMENT (IOC);  Surgeon: Williams Che;  Location: [facility];  Service: Ophthalmology;  Laterality: Left;  CDE- 1.78  . CATARACT EXTRACTION W/PHACO  07/13/2011   Procedure: CATARACT EXTRACTION PHACO AND INTRAOCULAR LENS PLACEMENT (IOC);  Surgeon: Williams Che, MD;  Location: [facility];  Service: Ophthalmology;  Laterality: Right;  CDE:  1.65  . DILATION AND CURETTAGE OF UTERUS  1970; 1976  . ESOPHAGEAL DILATION  multiple  . FOOT SURGERY Left ~ 2011   "straightened toe & scraped bone below big toe"  . FOOT SURGERY Right ~ 2011   "scraped bone of big toe; shortened middle toet"  . HIATAL HERNIA REPAIR  1990   "had to have scar tissue removed 6 months after repair"  . Inverted nipples  1992  . LAPAROSCOPIC CHOLECYSTECTOMY  1980's  . LIPOSUCTION  1992  . SALPINGOOPHORECTOMY Bilateral    "6 months or so after hysterectomy"  . SKIN CANCER EXCISION  1990's   "front side of right shin"  . TUBAL LIGATION  1976  . VENTRAL HERNIA REPAIR  07/29/2011   Procedure: LAPAROSCOPIC VENTRAL HERNIA;  Surgeon: Adin Hector, MD;  Location: [place];  Service: General;  Laterality: N/A;  multiple incarcerated hernias with mesh     OB History   No obstetric history on file.     Family History  Problem Relation Age of Onset  . Crohn's disease Mother   . Colitis Mother        Crohns  . Anesthesia problems Mother   . Arthritis Mother        rheumatoid; maternal side of family also with RA  . Pneumonia Mother   . Diabetes Father   . Heart disease Father   . Melanoma Father   . Coronary artery disease Father   . Skin cancer Father   . Stroke Father   . Diabetes Sister   . Migraines Sister   . Depression Maternal Uncle   . Dementia Maternal Uncle   . Colon cancer  Paternal Uncle        ? if stomach or colon   . Hypotension Neg Hx   . Malignant hyperthermia Neg Hx   . Pseudochol deficiency Neg Hx     Social History   Tobacco Use  . Smoking status: Never Smoker  . Smokeless tobacco: Never Used  Substance Use Topics  . Alcohol use: Never    Alcohol/week: 0.0 standard drinks  . Drug use: Never    Home Medications Prior to Admission medications   Medication Sig Start Date End Date Taking? Authorizing Provider  acetaminophen (TYLENOL) 500 MG  tablet Take 500 mg by mouth every 6 (six) hours as needed for mild pain or headache.    [provider]  atenolol (TENORMIN) 25 MG tablet Take 1.5 tablets (37.5 mg total) by mouth daily. TAKE 1 AND 1/2 TABLET TWICE A DAY 03/13/19   Ina Homes, MD  baclofen (LIORESAL) 10 MG tablet Take 0.5-1 tablets (5-10 mg total) by mouth 3 (three) times daily as needed for muscle spasms. 07/12/18   Hilts, Legrand Como, MD  Cholecalciferol (VITAMIN D3 PO) Take 5,000 Int'l Units by mouth daily.    [provider]  diltiazem (CARDIZEM CD) 180 MG 24 hr capsule Take 1 capsule (180 mg total) by mouth daily. 03/13/19   Ina Homes, MD  docusate sodium (COLACE) 100 MG capsule Take 100 mg by mouth daily as needed for mild constipation.    [provider]  DULoxetine (CYMBALTA) 60 MG capsule Take 1 capsule (60 mg total) by mouth daily. 03/13/19   Ina Homes, MD  famotidine (PEPCID) 20 MG tablet Take 1 tablet (20 mg total) by mouth daily. 12/22/18   Ina Homes, MD  hydrOXYzine (ATARAX/VISTARIL) 50 MG tablet Take 1 tablet (50 mg total) by mouth daily as needed. 03/17/19   Ina Homes, MD  levothyroxine (SYNTHROID) 50 MCG tablet Take 1 tablet (50 mcg total) by mouth every morning. 03/13/19   Ina Homes, MD  losartan (COZAAR) 25 MG tablet Take 1 tablet (25 mg total) by mouth daily. 03/13/19   Ina Homes, MD  metFORMIN (GLUCOPHAGE-XR) 500 MG 24 hr tablet Take 1 tablet with breakfast for the next 2  weeks THEN increase to 1 tablet twice daily with food for 2 weeks THEN increase to 2 tablets with breakfast and 1 tablet with dinner for 2 weeks THEN increase to 2 tablets twice daily with food. 03/13/19   Ina Homes, MD  Multiple Vitamin (MULTIVITAMIN) tablet Take 1 tablet by mouth daily.    [provider]  naproxen (EC NAPROSYN) 500 MG EC tablet Take 1 tablet (500 mg total) by mouth 2 (two) times daily as needed (take for pain with meals). 02/20/19 03/22/19  Chundi, Verne Spurr, MD  ondansetron (ZOFRAN) 4 MG tablet Take 1 tablet (4 mg total) by mouth 4 (four) times daily as needed for nausea or vomiting. 11/17/18   Harris, Abigail, PA-C  ondansetron (ZOFRAN-ODT) 4 MG disintegrating tablet Take 1 tablet (4 mg total) by mouth every 8 (eight) hours as needed for nausea. 05/02/18   Melvenia Beam, MD  PE-diphenhydrAMINE-DM-GG-APAP (MUCINEX FAST-MAX DAY/NIGHT PO) Take 2 tablets by mouth every 6 (six) hours as needed (cold symptoms).    [provider]  Phenylephrine-Pheniramine-DM [facility] COLD & COUGH) 12-27-18 MG PACK Take 1 Package by mouth every 6 (six) hours as needed (cough).    [provider]  Propylene Glycol (SYSTANE BALANCE) 0.6 % SOLN Place 1 drop into both eyes daily as needed (dry eyes).    [provider]  rivaroxaban (XARELTO) 20 MG TABS tablet TAKE 1 TABLET DAILY WITH SUPPER 03/13/19   Helberg, Larkin Ina, MD  rosuvastatin (CRESTOR) 20 MG tablet Take 1 tablet (20 mg total) by mouth daily. 03/13/19   Ina Homes, MD    Allergies    Gemfibrozil, Latex, Penicillins, Adhesive [tape], Codeine, Digoxin and related, Metformin and related, Omeprazole, and Ace inhibitors  Review of Systems   Review of Systems All other systems negative except as documented in the HPI. All pertinent positives and negatives as reviewed in the HPI. Physical Exam Updated Vital Signs  BP 135/79 (BP Location: Left Arm)   Pulse 90   Temp 98.6 F (37 C) (Oral)   Resp 16   Ht 5\' 3"   (1.6 m)   Wt 78.9 kg   LMP 06/03/1978   SpO2 95%   BMI 30.82 kg/m   Physical Exam Vitals and nursing note reviewed.  Constitutional:      General: She is not in acute distress.    Appearance: She is well-developed.  HENT:     Head: Normocephalic and atraumatic.  Eyes:     Pupils: Pupils are equal, round, and reactive to light.  Cardiovascular:     Rate and Rhythm: Normal rate and regular rhythm.     Heart sounds: Normal heart sounds. No murmur. No friction rub. No gallop.   Pulmonary:     Effort: Pulmonary effort is normal. No respiratory distress.     Breath sounds: Normal breath sounds. No wheezing.  Abdominal:     General: Bowel sounds are normal. There is no distension.     Palpations: Abdomen is soft.     Tenderness: There is no abdominal tenderness.  Musculoskeletal:     Cervical back: Normal range of motion and neck supple.  Skin:    General: Skin is warm and dry.     Capillary Refill: Capillary refill takes less than 2 seconds.     Findings: No erythema or rash.  Neurological:     Mental Status: She is alert and oriented to person, place, and time.     Sensory: No sensory deficit.     Motor: No weakness or abnormal muscle tone.     Coordination: Coordination normal. Finger-Nose-Finger Test and Heel to Shin Test normal.  Psychiatric:        Behavior: Behavior normal.     ED Results / Procedures / Treatments   Labs (all labs ordered are listed, but only abnormal results are displayed) Labs Reviewed  COMPREHENSIVE METABOLIC PANEL - Abnormal; Notable for the following components:      Result Value   Calcium 8.8 (*)    Total Bilirubin 1.7 (*)    All other components within normal limits  URINALYSIS, ROUTINE W REFLEX MICROSCOPIC - Abnormal; Notable for the following components:   Leukocytes,Ua TRACE (*)    All other components within normal limits  URINALYSIS, MICROSCOPIC (REFLEX) - Abnormal; Notable for the following components:   Bacteria, UA RARE (*)     All other components within normal limits  RESPIRATORY PANEL BY RT PCR (FLU A&B, COVID)  LIPASE, BLOOD  CBC    EKG None  Radiology CT Head Wo Contrast  Result Date: 03/17/2019 CLINICAL DATA:  Ataxia dizziness EXAM: CT HEAD WITHOUT CONTRAST TECHNIQUE: Contiguous axial images were obtained from the base of the skull through the vertex without intravenous contrast. COMPARISON:  CT brain 03/28/2018 FINDINGS: Brain: No evidence of acute infarction, hemorrhage, hydrocephalus, extra-axial collection or mass lesion/mass effect. Vascular: No hyperdense vessels.  Carotid vascular calcification Skull: Normal. Negative for fracture or focal lesion. Sinuses/Orbits: Mucosal thickening in the maxillary and ethmoid sinuses Other: None IMPRESSION: Negative non contrasted CT appearance of the brain. Electronically Signed   By: Donavan Foil M.D.   On: 03/17/2019 16:46    Procedures Procedures (including critical care time)  Medications Ordered in ED Medications  ondansetron (ZOFRAN) injection 4 mg (4 mg Intravenous Given 03/17/19 1539)  meclizine (ANTIVERT) tablet 25 mg (25 mg Oral Given 03/17/19 1629)  sodium chloride 0.9 % bolus 500 mL (0 mLs Intravenous  Stopped 03/17/19 1740)    ED Course  I have reviewed the triage vital signs and the nursing notes.  Pertinent labs & imaging results that were available during my care of the patient were reviewed by me and considered in my medical decision making (see chart for details).    MDM Rules/Calculators/A&P                      There is concerned that the patient may have a posterior circulation stroke therefore we will need to send her to [facility] for an MRI of her brain.  I did mention this to the neurologist and he advised call them if there is any abnormality of the MRI.  Patient was given meclizine here in the emergency department along with Zofran.  She was also given fluids.  Patient has been otherwise stable.  If the patient does have any  abnormality on the MRI she will need to be admitted to the hospital for further evaluation and care and at that time neurology will need to be consulted. Final Clinical Impression(s) / ED Diagnoses Final diagnoses:  None    Rx / DC Orders ED Discharge Orders    None       Dalia Heading, PA-C 03/17/19 2144    Little, Wenda Overland, MD 03/23/19 [phone]

## 2019-03-17 NOTE — ED Notes (Signed)
Pt aware of need for urine specimen, unable to provide at this time, IV fluids ordered

## 2019-03-17 NOTE — ED Notes (Signed)
Pt ambulating well with assist. Pt needs one person assist or walker to stay steady. Pt expresses concern about going home d/t husbands having recent surgery and needing PT for himself. Husband cannot help pt.

## 2019-03-17 NOTE — ED Notes (Signed)
BIB EMS as transfer from [facility], sent here for stat MRI. Reports dizziness since last night, no neuro deficits. VSS.

## 2019-03-17 NOTE — ED Triage Notes (Signed)
Dizziness since last night after laying down. Hx chronic back pain.

## 2019-03-17 NOTE — ED Triage Notes (Addendum)
Per EMS:  Pt having dizziness since last night with movement of her head.  Pt having some diarrhea due to new start of metformin.  EMS states her eyes are yellow.

## 2019-03-17 NOTE — ED Provider Notes (Signed)
9:54 PM patient transferred from [place] to [facility] for MRI.  Patient has a history of diabetes, atrial fibrillation on anticoagulation.  Patient developed some spots in her vision last night before going to bed.  This was associated with headache.  She woke up in the middle of the night and was very dizzy.  She needed to hold onto objects that help walk across the room.  She had nausea earlier today but no vomiting.  No vision loss.  Dizziness persisted prompting ED visit.  Patient reports increasing headaches recently associated with stress from the sudden death of her daughter in 02-04-23. She is followed by the internal medicine clinic here.  Previous provider discussed with neurology by telephone.  Recommended symptom control and MRI.  If any MRI abnormalities, neurology will be consulted and patient will need admitted to the hospital.  BP 135/79 (BP Location: Left Arm)   Pulse 90   Temp 98.6 F (37 C) (Oral)   Resp 16   Ht 5\' 3"  (1.6 m)   Wt 78.9 kg   LMP 06/03/1978   SpO2 95%   BMI 30.82 kg/m   11:15 PM MRI reviewed and without signs of stroke. Pt updated. Attempted ambulation. Pt feels better than earlier but still very unstable. PO valium ordered. Pt will need reassessed. Hopefully her symptoms will continue to improve and she can be discharged.   Sign-out to [place] PA-C at shift change. Plan reassess, dispo per trajectory of symptoms.    Carlisle Cater, PA-C 03/20/19 1719    Maudie Flakes, MD 03/23/19 [phone]

## 2019-03-17 NOTE — ED Notes (Signed)
Transported to MRI

## 2019-03-17 NOTE — ED Notes (Signed)
Spoke with Herbert Spires, Camera operator at [facility], regarding transfer for MRI

## 2019-03-17 NOTE — ED Notes (Signed)
Pt oob to ambulate to bathroom, continues to report significant dizziness with ambulation.

## 2019-03-18 MED ORDER — DIAZEPAM 5 MG PO TABS: 5.0000 mg | ORAL_TABLET | Freq: Two times a day (BID) | ORAL | 0 refills | Status: DC | PRN

## 2019-03-18 NOTE — Discharge Instructions (Addendum)
1. Medications: Valium as needed for dizziness.  Do not drive while taking this medication.  Usual home medications 2. Treatment: rest, drink plenty of fluids,  3. Follow Up: Please followup with your primary doctor in 2-3 days for reevaluation of your symptoms. Please return to the ER for fevers, headaches, neck pain, neck stiffness, changes in vision, vomiting or other concerns.

## 2019-03-18 NOTE — ED Provider Notes (Signed)
Care assumed from [facility], [place].  Please see his full H&P.  In short,  [name] is a 69 y.o. female presents for dizziness.  She was transferred from [place] to [facility] for MRI to rule out stroke.  MRI here was negative however upon ambulation, patient was unable to ambulate due to severe dizziness.  She was given Valium here in the emergency department and allowed to rest.  Physical Exam  BP 135/79 (BP Location: Left Arm)   Pulse 90   Temp 98.6 F (37 C) (Oral)   Resp 16   Ht 5\' 3"  (1.6 m)   Wt 78.9 kg   LMP 06/03/1978   SpO2 95%   BMI 30.82 kg/m   Physical Exam Vitals and nursing note reviewed.  Constitutional:      General: She is not in acute distress.    Appearance: She is well-developed.     Comments: Pt resting.  She reports she is feeling a little better.  HENT:     Head: Normocephalic.  Eyes:     General: No scleral icterus.    Conjunctiva/sclera: Conjunctivae normal.  Cardiovascular:     Rate and Rhythm: Normal rate.  Pulmonary:     Effort: Pulmonary effort is normal.  Musculoskeletal:        General: Normal range of motion.     Cervical back: Normal range of motion.  Skin:    General: Skin is warm and dry.  Neurological:     Mental Status: She is alert.     ED Course/Procedures   Clinical Course as of Mar 17 201  Sat Mar 18, 2019  0015 Plan: Patient is to receive Valium and reassess ability to ambulate.   [HM]    Clinical Course User Index [HM] , Gwenlyn Perking    Procedures  MDM   Patient here for dizziness.  MRI without evidence of CVA.  This appears to be peripheral vertigo.  She has difficulty walking due to severe dizziness and has only had meclizine.  Will receive Valium and reassess.  1:59 AM Patient reports she is feeling much better.  I personally ambulated with the patient.  She has steady gait and is able to ambulate unassisted.  She reports she is feeling better.  Will  discharge home with short prescription for Valium to be used as needed.  Discussed potential for sedation and reasons not to drive while taking this medication or feeling dizzy.  Patient states understanding and is in agreement with the plan.  Dizziness  Peripheral vertigo, unspecified laterality      Agapito Games 03/18/19 0203    Ripley Fraise, MD 03/18/19 [phone]

## 2019-03-18 NOTE — ED Notes (Signed)
Discharge instructions discussed with pt. Pt verbalized understanding. Pt stable and ambulatory. No signature pad available. 

## 2019-03-20 ENCOUNTER — Telehealth: Payer: Self-pay | Admitting: *Deleted

## 2019-03-20 NOTE — Telephone Encounter (Signed)
Pt's spouse calls and states he is at a loss, pt was just disch early this am from [place], spouse states all pt does is lay in bed and cry, she will not eat nor drink. He is very concerned. States he has taken his guns out of the house because he has concerns for her safety. She is heartbroken over losing her daughter. He states she has not recently spoken of self harm nor harm to others. appt 1/14 1545 dr Tarri Abernethy.

## 2019-03-20 NOTE — Telephone Encounter (Signed)
Thank you :)

## 2019-03-20 NOTE — Telephone Encounter (Signed)
If he become concerned for her safety she should be taken to the ED for psych evaluation immediately.

## 2019-03-20 NOTE — Telephone Encounter (Signed)
Nurse informed him if he became concerned to call 911 or take her to [facility] ED

## 2019-03-22 ENCOUNTER — Telehealth: Payer: Self-pay | Admitting: *Deleted

## 2019-03-22 ENCOUNTER — Encounter (HOSPITAL_COMMUNITY): Payer: Self-pay | Admitting: Behavioral Health

## 2019-03-22 ENCOUNTER — Other Ambulatory Visit: Payer: Self-pay

## 2019-03-22 ENCOUNTER — Emergency Department (HOSPITAL_COMMUNITY)
Admission: EM | Admit: 2019-03-22 | Discharge: 2019-03-24 | Disposition: A | Discharge status: Home / Self Care | Payer: PPO | Attending: Emergency Medicine | Admitting: Emergency Medicine

## 2019-03-22 DIAGNOSIS — Z20822 Contact with and (suspected) exposure to covid-19: Secondary | ICD-10-CM | POA: Diagnosis not present

## 2019-03-22 DIAGNOSIS — I4891 Unspecified atrial fibrillation: Secondary | ICD-10-CM | POA: Diagnosis not present

## 2019-03-22 DIAGNOSIS — E1165 Type 2 diabetes mellitus with hyperglycemia: Secondary | ICD-10-CM | POA: Diagnosis not present

## 2019-03-22 DIAGNOSIS — Z7984 Long term (current) use of oral hypoglycemic drugs: Secondary | ICD-10-CM | POA: Insufficient documentation

## 2019-03-22 DIAGNOSIS — R05 Cough: Secondary | ICD-10-CM | POA: Diagnosis not present

## 2019-03-22 DIAGNOSIS — R45851 Suicidal ideations: Secondary | ICD-10-CM | POA: Diagnosis not present

## 2019-03-22 DIAGNOSIS — R0902 Hypoxemia: Secondary | ICD-10-CM | POA: Diagnosis not present

## 2019-03-22 DIAGNOSIS — R001 Bradycardia, unspecified: Secondary | ICD-10-CM | POA: Diagnosis not present

## 2019-03-22 DIAGNOSIS — Z91040 Latex allergy status: Secondary | ICD-10-CM | POA: Insufficient documentation

## 2019-03-22 DIAGNOSIS — I10 Essential (primary) hypertension: Secondary | ICD-10-CM | POA: Diagnosis not present

## 2019-03-22 DIAGNOSIS — F339 Major depressive disorder, recurrent, unspecified: Secondary | ICD-10-CM | POA: Diagnosis not present

## 2019-03-22 DIAGNOSIS — F329 Major depressive disorder, single episode, unspecified: Secondary | ICD-10-CM | POA: Diagnosis not present

## 2019-03-22 DIAGNOSIS — F4321 Adjustment disorder with depressed mood: Secondary | ICD-10-CM | POA: Diagnosis not present

## 2019-03-22 DIAGNOSIS — E119 Type 2 diabetes mellitus without complications: Secondary | ICD-10-CM | POA: Insufficient documentation

## 2019-03-22 DIAGNOSIS — I447 Left bundle-branch block, unspecified: Secondary | ICD-10-CM | POA: Diagnosis not present

## 2019-03-22 DIAGNOSIS — E039 Hypothyroidism, unspecified: Secondary | ICD-10-CM | POA: Insufficient documentation

## 2019-03-22 DIAGNOSIS — Z79899 Other long term (current) drug therapy: Secondary | ICD-10-CM | POA: Diagnosis not present

## 2019-03-22 DIAGNOSIS — R42 Dizziness and giddiness: Secondary | ICD-10-CM | POA: Diagnosis not present

## 2019-03-22 DIAGNOSIS — M5489 Other dorsalgia: Secondary | ICD-10-CM | POA: Diagnosis not present

## 2019-03-22 DIAGNOSIS — R059 Cough, unspecified: Secondary | ICD-10-CM

## 2019-03-22 LAB — CBC WITH DIFFERENTIAL/PLATELET
Abs Immature Granulocytes: 0.02 10*3/uL (ref 0.00–0.07)
Basophils Absolute: 0 10*3/uL (ref 0.0–0.1)
Basophils Relative: 0 %
Eosinophils Absolute: 0 10*3/uL (ref 0.0–0.5)
Eosinophils Relative: 0 %
HCT: 43.8 % (ref 36.0–46.0)
Hemoglobin: 15 g/dL (ref 12.0–15.0)
Immature Granulocytes: 0 %
Lymphocytes Relative: 18 %
Lymphs Abs: 1.6 10*3/uL (ref 0.7–4.0)
MCH: 31.6 pg (ref 26.0–34.0)
MCHC: 34.2 g/dL (ref 30.0–36.0)
MCV: 92.4 fL (ref 80.0–100.0)
Monocytes Absolute: 0.8 10*3/uL (ref 0.1–1.0)
Monocytes Relative: 8 %
Neutro Abs: 6.9 10*3/uL (ref 1.7–7.7)
Neutrophils Relative %: 74 %
Platelets: 212 10*3/uL (ref 150–400)
RBC: 4.74 MIL/uL (ref 3.87–5.11)
RDW: 12.4 % (ref 11.5–15.5)
WBC: 9.3 10*3/uL (ref 4.0–10.5)
nRBC: 0 % (ref 0.0–0.2)

## 2019-03-22 LAB — COMPREHENSIVE METABOLIC PANEL
ALT: 25 U/L (ref 0–44)
AST: 30 U/L (ref 15–41)
Albumin: 4 g/dL (ref 3.5–5.0)
Alkaline Phosphatase: 72 U/L (ref 38–126)
Anion gap: 10 (ref 5–15)
BUN: 10 mg/dL (ref 8–23)
CO2: 25 mmol/L (ref 22–32)
Calcium: 9.4 mg/dL (ref 8.9–10.3)
Chloride: 101 mmol/L (ref 98–111)
Creatinine, Ser: 0.72 mg/dL (ref 0.44–1.00)
GFR calc Af Amer: >60 mL/min (ref >60)
GFR calc non Af Amer: >60 mL/min (ref >60)
Glucose, Bld: 260 mg/dL — ABNORMAL HIGH (ref 70–99)
Potassium: 4 mmol/L (ref 3.5–5.1)
Sodium: 136 mmol/L (ref 135–145)
Total Bilirubin: 1.4 mg/dL — ABNORMAL HIGH (ref 0.3–1.2)
Total Protein: 7.4 g/dL (ref 6.5–8.1)

## 2019-03-22 LAB — SALICYLATE LEVEL: Salicylate Lvl: <7 mg/dL — ABNORMAL LOW (ref 7.0–30.0)

## 2019-03-22 LAB — ACETAMINOPHEN LEVEL: Acetaminophen (Tylenol), Serum: <10 ug/mL — ABNORMAL LOW (ref 10–30)

## 2019-03-22 LAB — RAPID URINE DRUG SCREEN, HOSP PERFORMED
Amphetamines: NOT DETECTED
Barbiturates: NOT DETECTED
Benzodiazepines: POSITIVE — AB
Cocaine: NOT DETECTED
Opiates: NOT DETECTED
Tetrahydrocannabinol: NOT DETECTED

## 2019-03-22 LAB — TROPONIN I (HIGH SENSITIVITY): Troponin I (High Sensitivity): <2 ng/L (ref <18)

## 2019-03-22 LAB — ETHANOL: Alcohol, Ethyl (B): <10 mg/dL (ref <10)

## 2019-03-22 MED ORDER — ATENOLOL 25 MG PO TABS
37.5000 mg | ORAL_TABLET | Freq: Every day | ORAL | Status: DC
  Administered 2019-03-24: 37.5 mg via ORAL
  Filled 2019-03-22 (×2): qty 1.5

## 2019-03-22 MED ORDER — DIAZEPAM 5 MG PO TABS
5.0000 mg | ORAL_TABLET | Freq: Two times a day (BID) | ORAL | Status: DC | PRN
  Administered 2019-03-23: 5 mg via ORAL
  Filled 2019-03-22: qty 1

## 2019-03-22 MED ORDER — DOCUSATE SODIUM 100 MG PO CAPS: 100.0000 mg | ORAL_CAPSULE | Freq: Every day | ORAL | Status: DC | PRN

## 2019-03-22 MED ORDER — DULOXETINE HCL 30 MG PO CPEP
60.0000 mg | ORAL_CAPSULE | Freq: Every day | ORAL | Status: DC
  Administered 2019-03-22 – 2019-03-24 (×3): 60 mg via ORAL
  Filled 2019-03-22 (×3): qty 2

## 2019-03-22 MED ORDER — DILTIAZEM HCL ER COATED BEADS 180 MG PO CP24
180.0000 mg | ORAL_CAPSULE | Freq: Every day | ORAL | Status: DC
  Administered 2019-03-23 – 2019-03-24 (×2): 180 mg via ORAL
  Filled 2019-03-22 (×2): qty 1

## 2019-03-22 MED ORDER — ROSUVASTATIN CALCIUM 20 MG PO TABS
20.0000 mg | ORAL_TABLET | Freq: Every day | ORAL | Status: DC
  Administered 2019-03-23 – 2019-03-24 (×2): 20 mg via ORAL
  Filled 2019-03-22 (×2): qty 1

## 2019-03-22 MED ORDER — SODIUM CHLORIDE 0.9 % IV BOLUS
1000.0000 mL | Freq: Once | INTRAVENOUS | Status: AC
  Administered 2019-03-22: 1000 mL via INTRAVENOUS

## 2019-03-22 MED ORDER — LOSARTAN POTASSIUM 25 MG PO TABS
25.0000 mg | ORAL_TABLET | Freq: Every day | ORAL | Status: DC
  Administered 2019-03-23 – 2019-03-24 (×2): 25 mg via ORAL
  Filled 2019-03-22 (×2): qty 1

## 2019-03-22 MED ORDER — NAPROXEN 500 MG PO TABS
500.0000 mg | ORAL_TABLET | Freq: Two times a day (BID) | ORAL | Status: DC | PRN
  Administered 2019-03-23: 500 mg via ORAL
  Filled 2019-03-22: qty 1

## 2019-03-22 MED ORDER — RIVAROXABAN 20 MG PO TABS
20.0000 mg | ORAL_TABLET | Freq: Every day | ORAL | Status: DC
  Administered 2019-03-22 – 2019-03-24 (×3): 20 mg via ORAL
  Filled 2019-03-22 (×5): qty 1

## 2019-03-22 MED ORDER — BACLOFEN 10 MG PO TABS
5.0000 mg | ORAL_TABLET | Freq: Three times a day (TID) | ORAL | Status: DC | PRN
  Administered 2019-03-23: 10 mg via ORAL
  Filled 2019-03-22: qty 1

## 2019-03-22 MED ORDER — METFORMIN HCL ER 500 MG PO TB24
500.0000 mg | ORAL_TABLET | Freq: Two times a day (BID) | ORAL | Status: DC
  Administered 2019-03-23 – 2019-03-24 (×3): 500 mg via ORAL
  Filled 2019-03-22 (×6): qty 1

## 2019-03-22 MED ORDER — FAMOTIDINE 20 MG PO TABS
20.0000 mg | ORAL_TABLET | Freq: Every day | ORAL | Status: DC
  Administered 2019-03-22 – 2019-03-24 (×3): 20 mg via ORAL
  Filled 2019-03-22 (×3): qty 1

## 2019-03-22 MED ORDER — HYDROXYZINE HCL 25 MG PO TABS
50.0000 mg | ORAL_TABLET | Freq: Every day | ORAL | Status: DC | PRN
  Administered 2019-03-23: 50 mg via ORAL
  Filled 2019-03-22: qty 2

## 2019-03-22 MED ORDER — LEVOTHYROXINE SODIUM 50 MCG PO TABS
50.0000 ug | ORAL_TABLET | Freq: Every day | ORAL | Status: DC
  Administered 2019-03-23 – 2019-03-24 (×2): 50 ug via ORAL
  Filled 2019-03-22 (×3): qty 1

## 2019-03-22 MED ORDER — ACETAMINOPHEN 500 MG PO TABS
500.0000 mg | ORAL_TABLET | Freq: Four times a day (QID) | ORAL | Status: DC | PRN
  Administered 2019-03-22 – 2019-03-23 (×2): 500 mg via ORAL
  Filled 2019-03-22 (×2): qty 1

## 2019-03-22 NOTE — Telephone Encounter (Signed)
Spouse calls and states pt is worse, cannot get out of bed. He is ask to call 911 and go to ED. He is reluctant but finally agrees. Will keep appt for thurs just in case .

## 2019-03-22 NOTE — ED Provider Notes (Signed)
[facility] DEPT Provider Note   CSN: [id number] Arrival date & time: 03/22/19  1240     History Chief Complaint  Patient presents with  . Dizziness  . Back Pain    [name] is a 69 y.o. female history of A. fib on Xarelto, depression, chronic back pain here presenting with suicidal ideation, persistent dizziness Patient states that she lost her daughter several months ago so she has been very depressed. Allso her husband was recently admitted to the hospital and almost died and she got more depressed.  She states that she went to the hospital about a week ago for vertigo.  She eventually had an MRI that was unremarkable .  Soon afterwards, she was telling her husband that she does not feel safe at home and wants to kill herself with a gun.  She states that she feels more depressed and really wants to kill herself now.  She apparently talked to her husband about shooting herself and her husband locked up all the guns in the house.  Patient states that her dizziness has been stable since last week .  She has chronic back pain and been on multiple medicines and had finished a course of therapy with no relief.  She has no worsening back pain.  She called her doctor and was sent here for psych eval.  The history is provided by the patient.       Past Medical History:  Diagnosis Date  . Abnormal CT scan 03/16/2017  . Atrial fibrillation ([place])   . Bunion   . Carotid stenosis   . Carpal tunnel syndrome    mild   . Cervical arthritis   . Cholecystitis    s/p cholecystectomy  . Chronic back pain   . Chronic gastritis   . Depression   . Diabetic peripheral neuropathy ([place])   . Diarrhea 04/05/2015  . Digoxin toxicity    08/2011  . Diverticulitis    dx 04/08/14  . Diverticulosis   . Dysphagia    no documented strictures but responded positively to dilation in past.   . Epileptic seizure, tonic ([place])    .No meds since age of 36.  . Family history  of adverse reaction to anesthesia    "my mother may have"  . Fatty liver   . Fibromyalgia   . GERD (gastroesophageal reflux disease)   . H/O hiatal hernia    S/P hernia repair  . Hyperlipidemia   . Hypertension   . Hypothyroid   . Internal hemorrhoid   . Joint pain 05/08/2011  . Left knee pain 12/19/2014  . Migraine   . Mild cognitive impairment   . Muscle spasm of back 08/10/2013  . Nonspecific (abnormal) findings on radiological and other examination of gastrointestinal tract 08/07/2011  . OSA (obstructive sleep apnea) dx'd 2003   "can't sleep w/that mask; lost some weight; maybe that's helped" (04/26/2014)  . Osteoarthritis   . Pneumonia X 2  . Right foot pain 08/03/2013  . Sarcoidosis   . Skin neoplasm   . TIA (transient ischemic attack)    "don't know when I had it" (04/26/2014)  . Transaminase or LDH elevation   . Type II diabetes mellitus ([place])   . Ventral hernia s/p laparoscopic lysis of adhesions and hernia repair with mesh 07/29/11 08/01/2011    Patient Active Problem List   Diagnosis Date Noted  . Cellulitis 02/14/2019  . Subcutaneous fat necrosis 01/18/2019  . COVID-19 virus detected 11/07/2018  .  Vitamin D deficiency 04/28/2014  . Poor dentition 04/03/2013  . Healthcare maintenance 03/31/2013  . Abnormality of gait 07/25/2012  . Headache 02/25/2012  . Chronic back pain 10/07/2011  . Financial difficulties 10/07/2011  . Hemorrhoids 10/06/2011  . Anemia 10/02/2011  . Diverticulosis 10/02/2011  . Depression 05/09/2009  . OBESITY, UNSPECIFIED 11/06/2008  . Carotid stenosis 11/06/2008  . FIBROMYALGIA, SEVERE 09/14/2008  . GERD 09/12/2008  . Osteopenia 06/22/2008  . Hyperlipidemia 06/30/2007  . TIA 06/01/2007  . DIABETIC PERIPHERAL NEUROPATHY 04/05/2006  . Diabetes type 2, controlled ([place]) 03/18/2006  . Hypothyroidism 12/21/2005  . Essential hypertension 12/21/2005  . Atrial fibrillation ([place]) 12/21/2005  . Osteoarthritis 12/21/2005    Past Surgical History:   Procedure Laterality Date  . ABDOMINAL HYSTERECTOMY  1980's   partial  . BILATERAL OOPHORECTOMY  1980's   "after partial hysterectomy"  . BREAST BIOPSY Right    benign  . BREAST EXCISIONAL BIOPSY Right 03/2006   benign  . BREAST SURGERY Right ~ 2010   excision milk duct;  . BUNIONECTOMY Bilateral   . CATARACT EXTRACTION W/PHACO  10/27/2010   Procedure: CATARACT EXTRACTION PHACO AND INTRAOCULAR LENS PLACEMENT (IOC);  Surgeon: Williams Che;  Location: [facility];  Service: Ophthalmology;  Laterality: Left;  CDE- 1.78  . CATARACT EXTRACTION W/PHACO  07/13/2011   Procedure: CATARACT EXTRACTION PHACO AND INTRAOCULAR LENS PLACEMENT (IOC);  Surgeon: Williams Che, MD;  Location: [facility];  Service: Ophthalmology;  Laterality: Right;  CDE:  1.65  . DILATION AND CURETTAGE OF UTERUS  1970; 1976  . ESOPHAGEAL DILATION  multiple  . FOOT SURGERY Left ~ 2011   "straightened toe & scraped bone below big toe"  . FOOT SURGERY Right ~ 2011   "scraped bone of big toe; shortened middle toet"  . HIATAL HERNIA REPAIR  1990   "had to have scar tissue removed 6 months after repair"  . Inverted nipples  1992  . LAPAROSCOPIC CHOLECYSTECTOMY  1980's  . LIPOSUCTION  1992  . SALPINGOOPHORECTOMY Bilateral    "6 months or so after hysterectomy"  . SKIN CANCER EXCISION  1990's   "front side of right shin"  . TUBAL LIGATION  1976  . VENTRAL HERNIA REPAIR  07/29/2011   Procedure: LAPAROSCOPIC VENTRAL HERNIA;  Surgeon: Adin Hector, MD;  Location: [facility];  Service: General;  Laterality: N/A;  multiple incarcerated hernias with mesh     OB History   No obstetric history on file.     Family History  Problem Relation Age of Onset  . Crohn's disease Mother   . Colitis Mother        Crohns  . Anesthesia problems Mother   . Arthritis Mother        rheumatoid; maternal side of family also with RA  . Pneumonia Mother   . Diabetes Father   . Heart disease Father   . Melanoma Father   . Coronary artery  disease Father   . Skin cancer Father   . Stroke Father   . Diabetes Sister   . Migraines Sister   . Depression Maternal Uncle   . Dementia Maternal Uncle   . Colon cancer Paternal Uncle        ? if stomach or colon   . Hypotension Neg Hx   . Malignant hyperthermia Neg Hx   . Pseudochol deficiency Neg Hx     Social History   Tobacco Use  . Smoking status: Never Smoker  . Smokeless tobacco: Never Used  Substance Use Topics  . Alcohol use: Never    Alcohol/week: 0.0 standard drinks  . Drug use: Never    Home Medications Prior to Admission medications   Medication Sig Start Date End Date Taking? Authorizing Provider  acetaminophen (TYLENOL) 500 MG tablet Take 500 mg by mouth every 6 (six) hours as needed for mild pain or headache.   Yes [provider]  atenolol (TENORMIN) 25 MG tablet Take 1.5 tablets (37.5 mg total) by mouth daily. TAKE 1 AND 1/2 TABLET TWICE A DAY 03/13/19  Yes Helberg, Larkin Ina, MD  baclofen (LIORESAL) 10 MG tablet Take 0.5-1 tablets (5-10 mg total) by mouth 3 (three) times daily as needed for muscle spasms. 07/12/18  Yes Hilts, Legrand Como, MD  Cholecalciferol (VITAMIN D3 PO) Take 5,000 Int'l Units by mouth daily.   Yes [provider]  diazepam (VALIUM) 5 MG tablet Take 1 tablet (5 mg total) by mouth every 12 (twelve) hours as needed (dizziness). 03/18/19  Yes Muthersbaugh, Jarrett Soho, PA-C  diltiazem (CARDIZEM CD) 180 MG 24 hr capsule Take 1 capsule (180 mg total) by mouth daily. 03/13/19  Yes Helberg, Larkin Ina, MD  docusate sodium (COLACE) 100 MG capsule Take 100 mg by mouth daily as needed for mild constipation.   Yes [provider]  DULoxetine (CYMBALTA) 60 MG capsule Take 1 capsule (60 mg total) by mouth daily. 03/13/19  Yes Helberg, Larkin Ina, MD  famotidine (PEPCID) 20 MG tablet Take 1 tablet (20 mg total) by mouth daily. 12/22/18  Yes Helberg, Larkin Ina, MD  hydrOXYzine (ATARAX/VISTARIL) 50 MG tablet Take 1 tablet (50 mg total) by mouth daily as  needed. 03/17/19  Yes Helberg, Larkin Ina, MD  levothyroxine (SYNTHROID) 50 MCG tablet Take 1 tablet (50 mcg total) by mouth every morning. 03/13/19  Yes Helberg, Larkin Ina, MD  losartan (COZAAR) 25 MG tablet Take 1 tablet (25 mg total) by mouth daily. 03/13/19  Yes Helberg, Larkin Ina, MD  metFORMIN (GLUCOPHAGE-XR) 500 MG 24 hr tablet Take 1 tablet with breakfast for the next 2 weeks THEN increase to 1 tablet twice daily with food for 2 weeks THEN increase to 2 tablets with breakfast and 1 tablet with dinner for 2 weeks THEN increase to 2 tablets twice daily with food. 03/13/19  Yes Helberg, Larkin Ina, MD  Multiple Vitamin (MULTIVITAMIN) tablet Take 1 tablet by mouth daily.   Yes [provider]  naproxen (EC NAPROSYN) 500 MG EC tablet Take 1 tablet (500 mg total) by mouth 2 (two) times daily as needed (take for pain with meals). 02/20/19 03/22/19 Yes Chundi, Vahini, MD  ondansetron (ZOFRAN-ODT) 4 MG disintegrating tablet Take 1 tablet (4 mg total) by mouth every 8 (eight) hours as needed for nausea. 05/02/18  Yes Melvenia Beam, MD  PE-diphenhydrAMINE-DM-GG-APAP (MUCINEX FAST-MAX DAY/NIGHT PO) Take 2 tablets by mouth every 6 (six) hours as needed (cold symptoms).   Yes [provider]  Phenylephrine-Pheniramine-DM [facility] COLD & COUGH) 12-27-18 MG PACK Take 1 Package by mouth every 6 (six) hours as needed (cough).   Yes [provider]  Propylene Glycol (SYSTANE BALANCE) 0.6 % SOLN Place 1 drop into both eyes daily as needed (dry eyes).   Yes [provider]  rivaroxaban (XARELTO) 20 MG TABS tablet TAKE 1 TABLET DAILY WITH SUPPER Patient taking differently: Take 20 mg by mouth daily with supper.  03/13/19  Yes Helberg, Larkin Ina, MD  rosuvastatin (CRESTOR) 20 MG tablet Take 1 tablet (20 mg total) by mouth daily. 03/13/19  Yes Ina Homes, MD  ondansetron [facility])  4 MG tablet Take 1 tablet (4 mg total) by mouth 4 (four) times daily as needed for nausea or vomiting. Patient not taking:  Reported on 03/22/2019 11/17/18   Margarita Mail, PA-C    Allergies    Gemfibrozil, Latex, Penicillins, Adhesive [tape], Codeine, Digoxin and related, Metformin and related, Omeprazole, and Ace inhibitors  Review of Systems   Review of Systems  Psychiatric/Behavioral: Positive for dysphoric mood and suicidal ideas.    Physical Exam Updated Vital Signs BP (!) 144/90   Pulse (!) 103   Temp 99.2 F (37.3 C) (Oral)   Resp (!) 27   LMP 06/03/1978   SpO2 95%   Physical Exam Vitals and nursing note reviewed.  Constitutional:      Comments: Crying, tearful   HENT:     Head: Normocephalic.     Nose: Nose normal.     Mouth/Throat:     Mouth: Mucous membranes are moist.  Eyes:     Extraocular Movements: Extraocular movements intact.     Pupils: Pupils are equal, round, and reactive to light.  Cardiovascular:     Rate and Rhythm: Normal rate and regular rhythm.     Pulses: Normal pulses.     Heart sounds: Normal heart sounds.  Pulmonary:     Effort: Pulmonary effort is normal.     Breath sounds: Normal breath sounds.  Abdominal:     General: Abdomen is flat.     Palpations: Abdomen is soft.  Musculoskeletal:     Cervical back: Normal range of motion.     Comments: L parathoracic tenderness   Skin:    General: Skin is warm.     Capillary Refill: Capillary refill takes less than 2 seconds.  Neurological:     General: No focal deficit present.     Mental Status: She is oriented to person, place, and time.  Psychiatric:     Comments: Depressed, tearful, poor judgment      ED Results / Procedures / Treatments   Labs (all labs ordered are listed, but only abnormal results are displayed) Labs Reviewed  COMPREHENSIVE METABOLIC PANEL - Abnormal; Notable for the following components:      Result Value   Glucose, Bld 260 (*)    All other components within normal limits  RAPID URINE DRUG SCREEN, HOSP PERFORMED - Abnormal; Notable for the following components:    Benzodiazepines POSITIVE (*)    All other components within normal limits  SALICYLATE LEVEL - Abnormal; Notable for the following components:   Salicylate Lvl [id number] (*)    All other components within normal limits  ACETAMINOPHEN LEVEL - Abnormal; Notable for the following components:   Acetaminophen (Tylenol), Serum <10 (*)    All other components within normal limits  CBC WITH DIFFERENTIAL/PLATELET  ETHANOL  TROPONIN I (HIGH SENSITIVITY)  TROPONIN I (HIGH SENSITIVITY)    EKG None  Radiology No results found.  Procedures Procedures (including critical care time)  Medications Ordered in ED Medications  sodium chloride 0.9 % bolus 1,000 mL (1,000 mLs Intravenous New Bag/Given 03/22/19 1744)    ED Course  I have reviewed the triage vital signs and the nursing notes.  Pertinent labs & imaging results that were available during my care of the patient were reviewed by me and considered in my medical decision making (see chart for details).    MDM Rules/Calculators/A&P                      [place]  Burlene [name] is a 69 y.o. female here presenting with suicidal ideation Patient plans to kill herself with a gun .  Patient does have some chronic medical problems and chronic vertigo and back pain.   She had extensive evaluation in the ED a week ago and had a negative MRI .  Will get medical clearance labs and consult psychiatry  6:50 PM Patient's labs unremarkable. Psych recommend observation overnight and reassess in AM. Holding orders placed   Final Clinical Impression(s) / ED Diagnoses Final diagnoses:  None    Rx / DC Orders ED Discharge Orders    None       Drenda Freeze, MD 03/22/19 [phone]
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
## 2019-03-22 NOTE — ED Notes (Signed)
ACT recommends patient stay over night and going to try to get her to observation unit.

## 2019-03-22 NOTE — ED Triage Notes (Signed)
Transported by [facility] from home-- diagnosed with vertigo last week, also has history of chronic back pain. Hx of a fib. VSS with EMS. CBG of 301, hx of DM.

## 2019-03-22 NOTE — ED Notes (Signed)
Tele-psych at bedside.

## 2019-03-23 ENCOUNTER — Telehealth (HOSPITAL_COMMUNITY): Payer: Self-pay | Admitting: Professional

## 2019-03-23 ENCOUNTER — Encounter: Payer: PPO | Admitting: Internal Medicine

## 2019-03-23 DIAGNOSIS — F4321 Adjustment disorder with depressed mood: Secondary | ICD-10-CM

## 2019-03-23 LAB — RESPIRATORY PANEL BY RT PCR (FLU A&B, COVID)
Influenza A by PCR: NEGATIVE
Influenza B by PCR: NEGATIVE
SARS Coronavirus 2 by RT PCR: NEGATIVE

## 2019-03-23 NOTE — Discharge Instructions (Addendum)
Please return for any problem.  Follow-up with your regular care provider as instructed. °

## 2019-03-23 NOTE — Consult Note (Signed)
SW spoke with patient's spouse.  Per collateral information received from spouse, patient has a gun at home and has voiced intent to shoot herself.  Based on this information, she now meets criteria for inpatient admission; recommend geri psych.

## 2019-03-23 NOTE — Progress Notes (Signed)
Pt meets inpatient criteria per Merlyn Lot, NP. Referral information sent to the following hospitals for review:  [facility]      Disposition will continue to follow.   Audree Camel, LCSW, [place] Disposition [facility] BHH/TTS [phone]

## 2019-03-23 NOTE — ED Notes (Signed)
Plan of care after TTS: "Pt should remain overnight and have AM psych eval. Brunetta Jeans stated that Pt is appropriate for [facility] OBS unit."

## 2019-03-23 NOTE — Progress Notes (Signed)
CSW spoke with pt's spouse, Robertha Ficarra [phone]) regarding concerns that pt should not be psychiatrically cleared. Spouse reports that pt's symptoms of depression (anhedonia, crying, SI, fatigue) have been worsening since the unexpected death of her daughter in 02-27-2019. Pt has reportedly told spouse several times that she will kill herself by shooting herself with one of their guns. "She stays in bed all day and can't stop crying. Sometimes I hear her talking to herself; telling herself that she's stupid." Spouse hid all the guns in the home earlier this week. Pt reportedly told him yesterday morning that she could, "No longer take it" and was going to kill herself. Spouse took her to the [facility] ED soon after.   CSW collaborated with Dr. Dwyane Dee. Pt does meet psychiatric criteria. [facility] ED charge RN notified. Spouse notified.   Audree Camel, LCSW, [place] Disposition [facility] BHH/TTS [phone]

## 2019-03-23 NOTE — ED Notes (Signed)
Removed IV from R wrist.

## 2019-03-23 NOTE — Consult Note (Signed)
[place] [facility] Consult   Reason for Consult:  Passive suicidal ideations Referring Physician:  EDP Patient Identification: [name] MRN:  [id number] Principal Diagnosis: Adjustment disorder with depressed mood Diagnosis:  Principal Problem:   Adjustment disorder with depressed mood  Total Time spent with patient: 30 minutes  Tele Assessment T[place], 69 y.o., female patient presented to [facility] ED, voluntarily with passive suicidal ideations after the recent loss of a child.  Patient seen via telepsych by this provider; chart reviewed and consulted with Dr. Dwyane Dee on 03/23/19.  On evaluation [name] reports , "I am doing okay today."  Collateral information is also received from the nurse who states she patient has been cooperative but tearful when talking about the death of her daughter that occurred a few months prior.  She states the patient has not voiced any safety concerns.   During evaluation [name] is laying on the bed in lateral recombinant position; She is alert/oriented x 4; appears sad but cooperative; and mood congruent with affect. She appears sad, low energy but able to talk with this writer to adequately communicate her concerns/needs.  Patient is speaking in a clear tone at low volume, and normal pace; with good eye contact.  Her thought process is coherent and relevant; There is no indication that she is currently responding to internal/external stimuli or experiencing delusional thought content.  Patient denies suicidal/self-harm/homicidal ideation, psychosis, and paranoia.  Patient has remained calm throughout assessment and has answered questions appropriately.    Past Psychiatric History: depression  Risk to Self: Suicidal Ideation: No(But see notes) Suicidal Intent: No Is patient at risk for suicide?: No Suicidal Plan?: No Access to Means: No What has been your use of drugs/alcohol within the last 12  months?: Denied Intentional Self Injurious Behavior: None Risk to Others: Homicidal Ideation: No Thoughts of Harm to Others: No Current Homicidal Intent: No Current Homicidal Plan: No Access to Homicidal Means: No History of harm to others?: No Assessment of Violence: None Noted Does patient have access to weapons?: No Criminal Charges Pending?: No Does patient have a court date: No Prior Inpatient Therapy: Prior Inpatient Therapy: Yes Prior Therapy Dates: Several years ago Reason for Treatment: Cannot recall Prior Outpatient Therapy: Prior Outpatient Therapy: Yes Does patient have an ACCT team?: No Does patient have Intensive In-House Services?  : No Does patient have [facility] services? : No Does patient have P4CC services?: No  Past Medical History:  Past Medical History:  Diagnosis Date  . Abnormal CT scan 03/16/2017  . Atrial fibrillation ([place])   . Bunion   . Carotid stenosis   . Carpal tunnel syndrome    mild   . Cervical arthritis   . Cholecystitis    s/p cholecystectomy  . Chronic back pain   . Chronic gastritis   . Depression   . Diabetic peripheral neuropathy ([place])   . Diarrhea 04/05/2015  . Digoxin toxicity    08/2011  . Diverticulitis    dx 04/08/14  . Diverticulosis   . Dysphagia    no documented strictures but responded positively to dilation in past.   . Epileptic seizure, tonic ([place])    .No meds since age of 38.  . Family history of adverse reaction to anesthesia    "my mother may have"  . Fatty liver   . Fibromyalgia   . GERD (gastroesophageal reflux disease)   . H/O hiatal hernia    S/P hernia repair  . Hyperlipidemia   .  Hypertension   . Hypothyroid   . Internal hemorrhoid   . Joint pain 05/08/2011  . Left knee pain 12/19/2014  . Migraine   . Mild cognitive impairment   . Muscle spasm of back 08/10/2013  . Nonspecific (abnormal) findings on radiological and other examination of gastrointestinal tract 08/07/2011  . OSA (obstructive sleep apnea)  dx'd 2003   "can't sleep w/that mask; lost some weight; maybe that's helped" (04/26/2014)  . Osteoarthritis   . Pneumonia X 2  . Right foot pain 08/03/2013  . Sarcoidosis   . Skin neoplasm   . TIA (transient ischemic attack)    "don't know when I had it" (04/26/2014)  . Transaminase or LDH elevation   . Type II diabetes mellitus ([place])   . Ventral hernia s/p laparoscopic lysis of adhesions and hernia repair with mesh 07/29/11 08/01/2011    Past Surgical History:  Procedure Laterality Date  . ABDOMINAL HYSTERECTOMY  1980's   partial  . BILATERAL OOPHORECTOMY  1980's   "after partial hysterectomy"  . BREAST BIOPSY Right    benign  . BREAST EXCISIONAL BIOPSY Right 03/2006   benign  . BREAST SURGERY Right ~ 2010   excision milk duct;  . BUNIONECTOMY Bilateral   . CATARACT EXTRACTION W/PHACO  10/27/2010   Procedure: CATARACT EXTRACTION PHACO AND INTRAOCULAR LENS PLACEMENT (IOC);  Surgeon: Williams Che;  Location: [facility];  Service: Ophthalmology;  Laterality: Left;  CDE- 1.78  . CATARACT EXTRACTION W/PHACO  07/13/2011   Procedure: CATARACT EXTRACTION PHACO AND INTRAOCULAR LENS PLACEMENT (IOC);  Surgeon: Williams Che, MD;  Location: [facility];  Service: Ophthalmology;  Laterality: Right;  CDE:  1.65  . DILATION AND CURETTAGE OF UTERUS  1970; 1976  . ESOPHAGEAL DILATION  multiple  . FOOT SURGERY Left ~ 2011   "straightened toe & scraped bone below big toe"  . FOOT SURGERY Right ~ 2011   "scraped bone of big toe; shortened middle toet"  . HIATAL HERNIA REPAIR  1990   "had to have scar tissue removed 6 months after repair"  . Inverted nipples  1992  . LAPAROSCOPIC CHOLECYSTECTOMY  1980's  . LIPOSUCTION  1992  . SALPINGOOPHORECTOMY Bilateral    "6 months or so after hysterectomy"  . SKIN CANCER EXCISION  1990's   "front side of right shin"  . TUBAL LIGATION  1976  . VENTRAL HERNIA REPAIR  07/29/2011   Procedure: LAPAROSCOPIC VENTRAL HERNIA;  Surgeon: Adin Hector, MD;  Location: [place];  Service: General;  Laterality: N/A;  multiple incarcerated hernias with mesh   Family History:  Family History  Problem Relation Age of Onset  . Crohn's disease Mother   . Colitis Mother        Crohns  . Anesthesia problems Mother   . Arthritis Mother        rheumatoid; maternal side of family also with RA  . Pneumonia Mother   . Diabetes Father   . Heart disease Father   . Melanoma Father   . Coronary artery disease Father   . Skin cancer Father   . Stroke Father   . Diabetes Sister   . Migraines Sister   . Depression Maternal Uncle   . Dementia Maternal Uncle   . Colon cancer Paternal Uncle        ? if stomach or colon   . Hypotension Neg Hx   . Malignant hyperthermia Neg Hx   . Pseudochol deficiency Neg Hx    Family  Psychiatric  History: unknown Social History:  Social History   Substance and Sexual Activity  Alcohol Use Never  . Alcohol/week: 0.0 standard drinks     Social History   Substance and Sexual Activity  Drug Use Never    Social History   Socioeconomic History  . Marital status: Married    Spouse name: Leroy Sea  . Number of children: 1  . Years of education: 12th   . Highest education level: Not on file  Occupational History  . Occupation: Retired    Fish farm manager: DISABLED  Tobacco Use  . Smoking status: Never Smoker  . Smokeless tobacco: Never Used  Substance and Sexual Activity  . Alcohol use: Never    Alcohol/week: 0.0 standard drinks  . Drug use: Never  . Sexual activity: Yes    Birth control/protection: Surgical  Other Topics Concern  . Not on file  Social History Narrative   Pt recently married to [place].  She lives in [place] with husband.  Pt is not followed by psychiatrist.  She is retired.   Social Determinants of Health   Financial Resource Strain:   . Difficulty of Paying Living Expenses: Not on file  Food Insecurity:   . Worried About Charity fundraiser in the Last Year: Not on file  . Ran Out of Food in the Last  Year: Not on file  Transportation Needs:   . Lack of Transportation (Medical): Not on file  . Lack of Transportation (Non-Medical): Not on file  Physical Activity:   . Days of Exercise per Week: Not on file  . Minutes of Exercise per Session: Not on file  Stress:   . Feeling of Stress : Not on file  Social Connections:   . Frequency of Communication with Friends and Family: Not on file  . Frequency of Social Gatherings with Friends and Family: Not on file  . Attends Religious Services: Not on file  . Active Member of Clubs or Organizations: Not on file  . Attends Archivist Meetings: Not on file  . Marital Status: Not on file   Additional Social History:    Allergies:   Allergies  Allergen Reactions  . Gemfibrozil Swelling    REACTION: Angioedema  . Latex Other (See Comments)    Blisters where touched or applied  . Penicillins Hives    Will spread in patches all over the body. Did it involve swelling of the face/tongue/throat, SOB, or low BP? No Did it involve sudden or severe rash/hives, skin peeling, or any reaction on the inside of your mouth or nose? No Did you need to seek medical attention at a hospital or doctor's office? Yes When did it last happen? If all above answers are "NO", may proceed with cephalosporin use.   . Adhesive [Tape] Other (See Comments)    Will blister skin where applied - do not use BAND-AIDS.  Marland Kitchen Codeine Hives    Will spread in patches all over the body.  . Digoxin And Related     Makes BP drop  . Metformin And Related Diarrhea  . Omeprazole Diarrhea  . Ace Inhibitors Rash    Cough and rash     Labs:  Results for orders placed or performed during the hospital encounter of 03/22/19 (from the past 48 hour(s))  Rapid urine drug screen (hospital performed)     Status: Abnormal   Collection Time: 03/22/19  5:38 PM  Result Value Ref Range   Opiates NONE DETECTED NONE DETECTED  Cocaine NONE DETECTED NONE DETECTED    Benzodiazepines POSITIVE (A) NONE DETECTED   Amphetamines NONE DETECTED NONE DETECTED   Tetrahydrocannabinol NONE DETECTED NONE DETECTED   Barbiturates NONE DETECTED NONE DETECTED    Comment: (NOTE) DRUG SCREEN FOR MEDICAL PURPOSES ONLY.  IF CONFIRMATION IS NEEDED FOR ANY PURPOSE, NOTIFY LAB WITHIN 5 DAYS. LOWEST DETECTABLE LIMITS FOR URINE DRUG SCREEN Drug Class                     Cutoff (ng/mL) Amphetamine and metabolites    1000 Barbiturate and metabolites    200 Benzodiazepine                 [id number] Tricyclics and metabolites     300 Opiates and metabolites        300 Cocaine and metabolites        300 THC                            50 Performed at [facility], [place] [address], [place], [place] [address]   CBC with Differential     Status: None   Collection Time: 03/22/19  5:41 PM  Result Value Ref Range   WBC 9.3 4.0 - 10.5 K/uL   RBC 4.74 3.87 - 5.11 MIL/uL   Hemoglobin 15.0 12.0 - 15.0 g/dL   HCT 43.8 36.0 - 46.0 %   MCV 92.4 80.0 - 100.0 fL   MCH 31.6 26.0 - 34.0 pg   MCHC 34.2 30.0 - 36.0 g/dL   RDW 12.4 11.5 - 15.5 %   Platelets 212 150 - 400 K/uL   nRBC 0.0 0.0 - 0.2 %   Neutrophils Relative % 74 %   Neutro Abs 6.9 1.7 - 7.7 K/uL   Lymphocytes Relative 18 %   Lymphs Abs 1.6 0.7 - 4.0 K/uL   Monocytes Relative 8 %   Monocytes Absolute 0.8 0.1 - 1.0 K/uL   Eosinophils Relative 0 %   Eosinophils Absolute 0.0 0.0 - 0.5 K/uL   Basophils Relative 0 %   Basophils Absolute 0.0 0.0 - 0.1 K/uL   Immature Granulocytes 0 %   Abs Immature Granulocytes 0.02 0.00 - 0.07 K/uL    Comment: Performed at [facility], [place] [address], [place], [place] [address]  Comprehensive metabolic panel     Status: Abnormal   Collection Time: 03/22/19  5:41 PM  Result Value Ref Range   Sodium 136 135 - 145 mmol/L   Potassium 4.0 3.5 - 5.1 mmol/L   Chloride 101 98 - 111 mmol/L   CO2 25 22 - 32 mmol/L   Glucose, Bld 260 (H) 70 - 99 mg/dL   BUN 10 8  - 23 mg/dL   Creatinine, Ser 0.72 0.44 - 1.00 mg/dL   Calcium 9.4 8.9 - 10.3 mg/dL   Total Protein 7.4 6.5 - 8.1 g/dL   Albumin 4.0 3.5 - 5.0 g/dL   AST 30 15 - 41 U/L   ALT 25 0 - 44 U/L   Alkaline Phosphatase 72 38 - 126 U/L   Total Bilirubin 1.4 (H) 0.3 - 1.2 mg/dL   GFR calc non Af Amer >60 >60 mL/min   GFR calc Af Amer >60 >60 mL/min   Anion gap 10 5 - 15    Comment: Performed at [facility], [place] [address] [place], [place] [address]  Troponin I (High Sensitivity)  Status: None   Collection Time: 03/22/19  5:41 PM  Result Value Ref Range   Troponin I (High Sensitivity) <2 <18 ng/L    Comment: (NOTE) Elevated high sensitivity troponin I (hsTnI) values and significant  changes across serial measurements may suggest ACS but many other  chronic and acute conditions are known to elevate hsTnI results.  Refer to the Links section for chest pain algorithms and additional  guidance. Performed at [facility], [place] [address], [place], [place] [address]   Ethanol     Status: None   Collection Time: 03/22/19  5:41 PM  Result Value Ref Range   Alcohol, Ethyl (B) <10 <10 mg/dL    Comment: (NOTE) Lowest detectable limit for serum alcohol is 10 mg/dL. For medical purposes only. Performed at [facility], [place] [address], [place], [place] [id number]   Salicylate level     Status: Abnormal   Collection Time: 03/22/19  5:41 PM  Result Value Ref Range   Salicylate Lvl [id number] (L) 7.0 - 30.0 mg/dL    Comment: Performed at [facility], [place] [address], [place], [place] [address]  Acetaminophen level     Status: Abnormal   Collection Time: 03/22/19  5:41 PM  Result Value Ref Range   Acetaminophen (Tylenol), Serum <10 (L) 10 - 30 ug/mL    Comment: (NOTE) Therapeutic concentrations vary significantly. A range of 10-30 ug/mL  may be an effective concentration for many patients. However, some  are best treated at  concentrations outside of this range. Acetaminophen concentrations >150 ug/mL at 4 hours after ingestion  and >50 ug/mL at 12 hours after ingestion are often associated with  toxic reactions. Performed at [facility], [place] [address] [place], [place] [address]   Respiratory Panel by RT PCR (Flu A&B, Covid) - Nasopharyngeal Swab     Status: None   Collection Time: 03/22/19 11:09 PM   Specimen: Nasopharyngeal Swab  Result Value Ref Range   SARS Coronavirus 2 by RT PCR NEGATIVE NEGATIVE    Comment: (NOTE) SARS-CoV-2 target nucleic acids are NOT DETECTED. The SARS-CoV-2 RNA is generally detectable in upper respiratoy specimens during the acute phase of infection. The lowest concentration of SARS-CoV-2 viral copies this assay can detect is 131 copies/mL. A negative result does not preclude SARS-Cov-2 infection and should not be used as the sole basis for treatment or other patient management decisions. A negative result may occur with  improper specimen collection/handling, submission of specimen other than nasopharyngeal swab, presence of viral mutation(s) within the areas targeted by this assay, and inadequate number of viral copies (<131 copies/mL). A negative result must be combined with clinical observations, patient history, and epidemiological information. The expected result is Negative. Fact Sheet for Patients:  PinkCheek.be Fact Sheet for Healthcare Providers:  GravelBags.it This test is not yet ap proved or cleared by the [place] FDA and  has been authorized for detection and/or diagnosis of SARS-CoV-2 by FDA under an Emergency Use Authorization (EUA). This EUA will remain  in effect (meaning this test can be used) for the duration of the COVID-19 declaration under Section 564(b)(1) of the Act, 21 U.S.C. section 360bbb-3(b)(1), unless the authorization is terminated or revoked sooner.     Influenza A by PCR NEGATIVE NEGATIVE   Influenza B by PCR NEGATIVE NEGATIVE    Comment: (NOTE) The Xpert Xpress SARS-CoV-2/FLU/RSV assay is intended as an aid in  the diagnosis of influenza from Nasopharyngeal swab specimens and  should not be used as a sole basis for treatment. Nasal washings and  aspirates are unacceptable for Xpert Xpress SARS-CoV-2/FLU/RSV  testing. Fact Sheet for Patients: PinkCheek.be Fact Sheet for Healthcare Providers: GravelBags.it This test is not yet approved or cleared by the [place] FDA and  has been authorized for detection and/or diagnosis of SARS-CoV-2 by  FDA under an Emergency Use Authorization (EUA). This EUA will remain  in effect (meaning this test can be used) for the duration of the  Covid-19 declaration under Section 564(b)(1) of the Act, 21  U.S.C. section 360bbb-3(b)(1), unless the authorization is  terminated or revoked. Performed at [facility], [place] [address], [place], [place] [address]     Current Facility-Administered Medications  Medication Dose Route Frequency Provider Last Rate Last Admin  . acetaminophen (TYLENOL) tablet 500 mg  500 mg Oral Q6H PRN Drenda Freeze, MD   500 mg at 03/22/19 2308  . atenolol (TENORMIN) tablet 37.5 mg  37.5 mg Oral Daily Drenda Freeze, MD   Stopped at 03/23/19 1017  . baclofen (LIORESAL) tablet 5-10 mg  5-10 mg Oral TID PRN Drenda Freeze, MD      . diazepam (VALIUM) tablet 5 mg  5 mg Oral Q12H PRN Drenda Freeze, MD      . diltiazem [facility] CD) 24 hr capsule 180 mg  180 mg Oral Daily Drenda Freeze, MD   180 mg at 03/23/19 1016  . docusate sodium (COLACE) capsule 100 mg  100 mg Oral Daily PRN Drenda Freeze, MD      . DULoxetine (CYMBALTA) DR capsule 60 mg  60 mg Oral Daily Drenda Freeze, MD   60 mg at 03/23/19 1015  . famotidine (PEPCID) tablet 20 mg  20 mg Oral Daily Drenda Freeze,  MD   20 mg at 03/23/19 1015  . hydrOXYzine (ATARAX/VISTARIL) tablet 50 mg  50 mg Oral Daily PRN Drenda Freeze, MD      . levothyroxine (SYNTHROID) tablet 50 mcg  50 mcg Oral Q0600 Drenda Freeze, MD   50 mcg at 03/23/19 [phone]  . losartan (COZAAR) tablet 25 mg  25 mg Oral Daily Drenda Freeze, MD   25 mg at 03/23/19 1016  . metFORMIN (GLUCOPHAGE-XR) 24 hr tablet 500 mg  500 mg Oral BID WC Drenda Freeze, MD   500 mg at 03/23/19 1016  . naproxen (NAPROSYN) tablet 500 mg  500 mg Oral BID PRN Drenda Freeze, MD   500 mg at 03/23/19 1015  . rivaroxaban (XARELTO) tablet 20 mg  20 mg Oral Q supper Drenda Freeze, MD   20 mg at 03/22/19 2009  . rosuvastatin (CRESTOR) tablet 20 mg  20 mg Oral Daily Drenda Freeze, MD   20 mg at 03/23/19 1016   Current Outpatient Medications  Medication Sig Dispense Refill  . acetaminophen (TYLENOL) 500 MG tablet Take 500 mg by mouth every 6 (six) hours as needed for mild pain or headache.    Marland Kitchen atenolol (TENORMIN) 25 MG tablet Take 1.5 tablets (37.5 mg total) by mouth daily. TAKE 1 AND 1/2 TABLET TWICE A DAY 135 tablet 3  . baclofen (LIORESAL) 10 MG tablet Take 0.5-1 tablets (5-10 mg total) by mouth 3 (three) times daily as needed for muscle spasms. 90 each 3  . Cholecalciferol (VITAMIN D3 PO) Take 5,000 Int'l Units by mouth daily.    . diazepam (VALIUM) 5 MG tablet Take 1 tablet (5  mg total) by mouth every 12 (twelve) hours as needed (dizziness). 10 tablet 0  . diltiazem (CARDIZEM CD) 180 MG 24 hr capsule Take 1 capsule (180 mg total) by mouth daily. 90 capsule 3  . docusate sodium (COLACE) 100 MG capsule Take 100 mg by mouth daily as needed for mild constipation.    . DULoxetine (CYMBALTA) 60 MG capsule Take 1 capsule (60 mg total) by mouth daily. 90 capsule 3  . famotidine (PEPCID) 20 MG tablet Take 1 tablet (20 mg total) by mouth daily. 90 tablet 3  . hydrOXYzine (ATARAX/VISTARIL) 50 MG tablet Take 1 tablet (50 mg total) by mouth daily as  needed. 30 tablet 0  . levothyroxine (SYNTHROID) 50 MCG tablet Take 1 tablet (50 mcg total) by mouth every morning. 90 tablet 3  . losartan (COZAAR) 25 MG tablet Take 1 tablet (25 mg total) by mouth daily. 90 tablet 3  . metFORMIN (GLUCOPHAGE-XR) 500 MG 24 hr tablet Take 1 tablet with breakfast for the next 2 weeks THEN increase to 1 tablet twice daily with food for 2 weeks THEN increase to 2 tablets with breakfast and 1 tablet with dinner for 2 weeks THEN increase to 2 tablets twice daily with food. 360 tablet 0  . Multiple Vitamin (MULTIVITAMIN) tablet Take 1 tablet by mouth daily.    . ondansetron (ZOFRAN-ODT) 4 MG disintegrating tablet Take 1 tablet (4 mg total) by mouth every 8 (eight) hours as needed for nausea. 30 tablet 3  . PE-diphenhydrAMINE-DM-GG-APAP (MUCINEX FAST-MAX DAY/NIGHT PO) Take 2 tablets by mouth every 6 (six) hours as needed (cold symptoms).    . Phenylephrine-Pheniramine-DM (THERAFLU COLD & COUGH) 12-27-18 MG PACK Take 1 Package by mouth every 6 (six) hours as needed (cough).    . Propylene Glycol (SYSTANE BALANCE) 0.6 % SOLN Place 1 drop into both eyes daily as needed (dry eyes).    . rivaroxaban (XARELTO) 20 MG TABS tablet TAKE 1 TABLET DAILY WITH SUPPER (Patient taking differently: Take 20 mg by mouth daily with supper. ) 90 tablet 1  . rosuvastatin (CRESTOR) 20 MG tablet Take 1 tablet (20 mg total) by mouth daily. 90 tablet 3  . ondansetron (ZOFRAN) 4 MG tablet Take 1 tablet (4 mg total) by mouth 4 (four) times daily as needed for nausea or vomiting. (Patient not taking: Reported on 03/22/2019) 10 tablet 0    Musculoskeletal: Not assessed via telepsych   Psychiatric Specialty Exam: Physical Exam  Constitutional: She is oriented to person, place, and time. She appears well-developed.  HENT:  Head: Normocephalic.  Eyes: Pupils are equal, round, and reactive to light.  Cardiovascular: Normal rate.  Respiratory: Effort normal.  Musculoskeletal:        General: Normal  range of motion.     Cervical back: Normal range of motion.  Neurological: She is alert and oriented to person, place, and time.    Review of Systems  Psychiatric/Behavioral: Positive for suicidal ideas (passive suicidal ideations on admission; denies plan or intent). Negative for hallucinations and sleep disturbance. The patient is not nervous/anxious.     Blood pressure 111/83, pulse 82, temperature 99.2 F (37.3 C), temperature source Oral, resp. rate 18, last menstrual period 06/03/1978, SpO2 96 %.There is no height or weight on file to calculate BMI.  General Appearance: Casual and Fairly Groomed  Eye Contact:  Good  Speech:  Clear and Coherent  Volume:  Decreased  Mood:  Depressed  Affect:  Congruent and patient did offer a half smile during  assessment  Thought Process:  Coherent and Descriptions of Associations: Intact  Orientation:  Full (Time, Place, and Person)  Thought Content:  Logical  Suicidal Thoughts:  No  Homicidal Thoughts:  No  Memory:  Immediate;   Good Recent;   Good Remote;   Good  Judgement:  Good  Insight:  Good  Psychomotor Activity:  Decreased  Concentration:  Concentration: Good and Attention Span: Good  Recall:  Good  Fund of Knowledge:  Good  Language:  Good  Akathisia:  Negative  Handed:  Right  AIMS (if indicated):     Assets:  Communication Skills Desire for Improvement Financial Resources/Insurance Housing Intimacy Social Support  ADL's:  Intact  Cognition:  WNL  Sleep:   >6 hours    Treatment Plan Summary:  69 year old female who presents voluntarily to the [facility] for evaluation of depressive symptoms and suicidal ideation, without a plan or intent, and vertigo after the recent loss of her daughter.  She has a psychiatric history for depression, reports one previous inpatient hospitalization "many years ago".  She is not connected with an outside mental health team but takes duloxetine and hydroxzine that were ordered by he primary care  provider.  These medication were continued during her hospital stay.  Patient endorses good familial support, good frustration tolerance and many protective factors.  She is reasonably sad in lieu of her recent loss but able to verbalize her feeling associated with loss and willing to contract for safety.  She is a low immediate safety risk.   She does not meed criteria for inpatient psych hospitalization but would benefit from partial inpatient hospitalization program.    SW coordinated [facility] appointment for 1/15.  Marye Round is the POC at [phone] Continue with medications.    Disposition: No evidence of imminent risk to self or others at present.   Patient does not meet criteria for psychiatric inpatient admission. Supportive therapy provided about ongoing stressors. Discussed crisis plan, support from social network, calling 911, coming to the Emergency Department, and calling Suicide Hotline.  Mallie Darting, NP 03/23/2019 11:46 AM

## 2019-03-23 NOTE — ED Notes (Addendum)
Pt husband reports that pt is suicidal and has stated that "I wish you would just take my 60 and shoot me in the head!" Pt does have access to guns. Pt husband states that he has since hidden the guns in the home. Pt husband states that "Most days she just sits and cry." Pt has expressed :"survivor's remorse" since she her daughter died from Covid-51.

## 2019-03-23 NOTE — ED Notes (Signed)
Tele-psych at bedside.

## 2019-03-23 NOTE — ED Notes (Signed)
Patient ambulated to restroom x 1 assist.

## 2019-03-23 NOTE — ED Notes (Signed)
Patient arrived to room 28 calm and cooperative.  Pt promptly fell asleep and as she had recently had anxiety medicine I will assess her later.  Sitter is at doorway.

## 2019-03-23 NOTE — Progress Notes (Signed)
Referral to [facility] has been completed.   Audree Camel, LCSW, [place] Disposition [facility]/TTS [phone]

## 2019-03-23 NOTE — ED Notes (Signed)
Assisted to the bathroom, complains of dizziness. Tech stood by while she urinated and helped her back to bed. She reports feeling anxious, talking a lot tonight to Probation officer re her many loses most recently her 71 yo daughter who had a heart attack in 2022-11-24 and died. She endorses being sad. She is cooperative and pleasant and offers appropriate conversation. Tech at doorway to monitor her for safety.

## 2019-03-24 ENCOUNTER — Emergency Department (HOSPITAL_COMMUNITY): Payer: PPO

## 2019-03-24 DIAGNOSIS — Z79899 Other long term (current) drug therapy: Secondary | ICD-10-CM | POA: Diagnosis not present

## 2019-03-24 DIAGNOSIS — R45851 Suicidal ideations: Secondary | ICD-10-CM | POA: Diagnosis not present

## 2019-03-24 DIAGNOSIS — R748 Abnormal levels of other serum enzymes: Secondary | ICD-10-CM | POA: Diagnosis not present

## 2019-03-24 DIAGNOSIS — F322 Major depressive disorder, single episode, severe without psychotic features: Secondary | ICD-10-CM | POA: Diagnosis not present

## 2019-03-24 DIAGNOSIS — I4891 Unspecified atrial fibrillation: Secondary | ICD-10-CM | POA: Diagnosis not present

## 2019-03-24 DIAGNOSIS — G3184 Mild cognitive impairment, so stated: Secondary | ICD-10-CM | POA: Diagnosis not present

## 2019-03-24 DIAGNOSIS — R42 Dizziness and giddiness: Secondary | ICD-10-CM | POA: Diagnosis not present

## 2019-03-24 DIAGNOSIS — I6529 Occlusion and stenosis of unspecified carotid artery: Secondary | ICD-10-CM | POA: Diagnosis not present

## 2019-03-24 DIAGNOSIS — E039 Hypothyroidism, unspecified: Secondary | ICD-10-CM | POA: Diagnosis not present

## 2019-03-24 DIAGNOSIS — E1142 Type 2 diabetes mellitus with diabetic polyneuropathy: Secondary | ICD-10-CM | POA: Diagnosis not present

## 2019-03-24 DIAGNOSIS — Z20822 Contact with and (suspected) exposure to covid-19: Secondary | ICD-10-CM | POA: Diagnosis not present

## 2019-03-24 DIAGNOSIS — Z7901 Long term (current) use of anticoagulants: Secondary | ICD-10-CM | POA: Diagnosis not present

## 2019-03-24 DIAGNOSIS — Z8673 Personal history of transient ischemic attack (TIA), and cerebral infarction without residual deficits: Secondary | ICD-10-CM | POA: Diagnosis not present

## 2019-03-24 DIAGNOSIS — Z23 Encounter for immunization: Secondary | ICD-10-CM | POA: Diagnosis not present

## 2019-03-24 DIAGNOSIS — G8929 Other chronic pain: Secondary | ICD-10-CM | POA: Diagnosis not present

## 2019-03-24 DIAGNOSIS — K219 Gastro-esophageal reflux disease without esophagitis: Secondary | ICD-10-CM | POA: Diagnosis not present

## 2019-03-24 DIAGNOSIS — R7401 Elevation of levels of liver transaminase levels: Secondary | ICD-10-CM | POA: Diagnosis not present

## 2019-03-24 DIAGNOSIS — I480 Paroxysmal atrial fibrillation: Secondary | ICD-10-CM | POA: Diagnosis not present

## 2019-03-24 DIAGNOSIS — F339 Major depressive disorder, recurrent, unspecified: Secondary | ICD-10-CM | POA: Diagnosis not present

## 2019-03-24 DIAGNOSIS — E1169 Type 2 diabetes mellitus with other specified complication: Secondary | ICD-10-CM | POA: Diagnosis not present

## 2019-03-24 DIAGNOSIS — I10 Essential (primary) hypertension: Secondary | ICD-10-CM | POA: Diagnosis not present

## 2019-03-24 DIAGNOSIS — R05 Cough: Secondary | ICD-10-CM | POA: Diagnosis not present

## 2019-03-24 DIAGNOSIS — E785 Hyperlipidemia, unspecified: Secondary | ICD-10-CM | POA: Diagnosis not present

## 2019-03-24 DIAGNOSIS — M549 Dorsalgia, unspecified: Secondary | ICD-10-CM | POA: Diagnosis not present

## 2019-03-24 DIAGNOSIS — E1165 Type 2 diabetes mellitus with hyperglycemia: Secondary | ICD-10-CM | POA: Diagnosis not present

## 2019-03-24 DIAGNOSIS — Z91040 Latex allergy status: Secondary | ICD-10-CM | POA: Diagnosis not present

## 2019-03-24 DIAGNOSIS — Z7984 Long term (current) use of oral hypoglycemic drugs: Secondary | ICD-10-CM | POA: Diagnosis not present

## 2019-03-24 DIAGNOSIS — Z03818 Encounter for observation for suspected exposure to other biological agents ruled out: Secondary | ICD-10-CM | POA: Diagnosis not present

## 2019-03-24 DIAGNOSIS — E782 Mixed hyperlipidemia: Secondary | ICD-10-CM | POA: Diagnosis not present

## 2019-03-24 DIAGNOSIS — E559 Vitamin D deficiency, unspecified: Secondary | ICD-10-CM | POA: Diagnosis not present

## 2019-03-24 DIAGNOSIS — E079 Disorder of thyroid, unspecified: Secondary | ICD-10-CM | POA: Diagnosis not present

## 2019-03-24 DIAGNOSIS — G4733 Obstructive sleep apnea (adult) (pediatric): Secondary | ICD-10-CM | POA: Diagnosis not present

## 2019-03-24 DIAGNOSIS — E1140 Type 2 diabetes mellitus with diabetic neuropathy, unspecified: Secondary | ICD-10-CM | POA: Diagnosis not present

## 2019-03-24 LAB — CBG MONITORING, ED
Glucose-Capillary: 196 mg/dL — ABNORMAL HIGH (ref 70–99)
Glucose-Capillary: 227 mg/dL — ABNORMAL HIGH (ref 70–99)

## 2019-03-24 NOTE — Progress Notes (Signed)
Caryl Pina at [facility] stated that she has received the signed voluntary consent for treatment form. She reports that they are waiting for bed availability at this time but that a discharge will likely happen either later tonight or tomorrow morning. [facility] staff will notify disposition when a bed becomes available.   Audree Camel, LCSW, [place] Disposition [facility]/TTS [phone]

## 2019-03-24 NOTE — Progress Notes (Signed)
[place] voluntary consent for treatment form has been faxed to [facility] ED. After pt and a witness signs the form, it should be faxed to [facility] at [phone] or [phone].  Audree Camel, LCSW, [place] Disposition [facility]/TTS [phone]

## 2019-03-24 NOTE — ED Notes (Signed)
Updated spouse Leroy Sea) on plan of care including transport to new facility.

## 2019-03-24 NOTE — Progress Notes (Signed)
Pt accepted to [facility] Unit; [id number]; bed B.    Dr. Ananias Pilgrim is the accepting provider.    Dr. Ananias Pilgrim is the attending provider.    Call report to [phone].     [facility] @ [facility] ED notified.     Pt is Voluntary.    Pt may be transported by [facility].  Pt scheduled to arrive to the facility as soon as possible. The bed is available now.   Domenic Schwab, MSW, LCSW-A Clinical Disposition Social Worker [facility]/TTS [phone]

## 2019-03-24 NOTE — Progress Notes (Signed)
CSW faxed chest xray, EKG, most recent vitals and urinalysis results to [facility] for review, at their request.   Audree Camel, [place], [place] Disposition CSW [facility]/TTS [phone]

## 2019-03-24 NOTE — ED Notes (Signed)
Safe transport has been dispatched.

## 2019-03-24 NOTE — Consult Note (Signed)
Telepsych Consultation   Reason for Consult: Suicidal ideation Referring Physician: Elvina Sidle, EDP Location of Patient: [facility] emergency department Location of Provider: [facility]  Patient Identification: [name] MRN:  [id number] Principal Diagnosis: Adjustment disorder with depressed mood Diagnosis:  Principal Problem:   Adjustment disorder with depressed mood   Total Time spent with patient: 30 minutes  Subjective:   [name] is a 69 y.o. female patient admitted with suicidal ideation. Patient assessed by nurse practitioner.  Patient alert and oriented, answers appropriately.  Patient endorses passive suicidal ideations.  Patient denies homicidal ideations.  Patient denies access to weapons.  Patient denies auditory and visual hallucinations. Patient stressors include recent death of daughter and more distant death of ex-husband. Patient ruminates on loss of daughter state "I just wish that I had died." Patient seen along with Dr. Dwyane Dee, inpatient psychiatric treatment recommended. HPI: Patient endorsing suicidal ideations at home.  Past Psychiatric History: Denies  Risk to Self: Suicidal Ideation: No(But see notes) Suicidal Intent: No Is patient at risk for suicide?: No Suicidal Plan?: No Access to Means: No What has been your use of drugs/alcohol within the last 12 months?: Denied Intentional Self Injurious Behavior: None Risk to Others: Homicidal Ideation: No Thoughts of Harm to Others: No Current Homicidal Intent: No Current Homicidal Plan: No Access to Homicidal Means: No History of harm to others?: No Assessment of Violence: None Noted Does patient have access to weapons?: No Criminal Charges Pending?: No Does patient have a court date: No Prior Inpatient Therapy: Prior Inpatient Therapy: Yes Prior Therapy Dates: Several years ago Reason for Treatment: Cannot recall Prior Outpatient Therapy: Prior Outpatient Therapy:  Yes Does patient have an ACCT team?: No Does patient have Intensive In-House Services?  : No Does patient have [facility] services? : No Does patient have P4CC services?: No  Past Medical History:  Past Medical History:  Diagnosis Date  . Abnormal CT scan 03/16/2017  . Atrial fibrillation ([place])   . Bunion   . Carotid stenosis   . Carpal tunnel syndrome    mild   . Cervical arthritis   . Cholecystitis    s/p cholecystectomy  . Chronic back pain   . Chronic gastritis   . Depression   . Diabetic peripheral neuropathy ([place])   . Diarrhea 04/05/2015  . Digoxin toxicity    08/2011  . Diverticulitis    dx 04/08/14  . Diverticulosis   . Dysphagia    no documented strictures but responded positively to dilation in past.   . Epileptic seizure, tonic ([place])    .No meds since age of 86.  . Family history of adverse reaction to anesthesia    "my mother may have"  . Fatty liver   . Fibromyalgia   . GERD (gastroesophageal reflux disease)   . H/O hiatal hernia    S/P hernia repair  . Hyperlipidemia   . Hypertension   . Hypothyroid   . Internal hemorrhoid   . Joint pain 05/08/2011  . Left knee pain 12/19/2014  . Migraine   . Mild cognitive impairment   . Muscle spasm of back 08/10/2013  . Nonspecific (abnormal) findings on radiological and other examination of gastrointestinal tract 08/07/2011  . OSA (obstructive sleep apnea) dx'd 2003   "can't sleep w/that mask; lost some weight; maybe that's helped" (04/26/2014)  . Osteoarthritis   . Pneumonia X 2  . Right foot pain 08/03/2013  . Sarcoidosis   . Skin neoplasm   .  TIA (transient ischemic attack)    "don't know when I had it" (04/26/2014)  . Transaminase or LDH elevation   . Type II diabetes mellitus ([place])   . Ventral hernia s/p laparoscopic lysis of adhesions and hernia repair with mesh 07/29/11 08/01/2011    Past Surgical History:  Procedure Laterality Date  . ABDOMINAL HYSTERECTOMY  1980's   partial  . BILATERAL OOPHORECTOMY  1980's    "after partial hysterectomy"  . BREAST BIOPSY Right    benign  . BREAST EXCISIONAL BIOPSY Right 03/2006   benign  . BREAST SURGERY Right ~ 2010   excision milk duct;  . BUNIONECTOMY Bilateral   . CATARACT EXTRACTION W/PHACO  10/27/2010   Procedure: CATARACT EXTRACTION PHACO AND INTRAOCULAR LENS PLACEMENT (IOC);  Surgeon: Williams Che;  Location: [facility];  Service: Ophthalmology;  Laterality: Left;  CDE- 1.78  . CATARACT EXTRACTION W/PHACO  07/13/2011   Procedure: CATARACT EXTRACTION PHACO AND INTRAOCULAR LENS PLACEMENT (IOC);  Surgeon: Williams Che, MD;  Location: [facility];  Service: Ophthalmology;  Laterality: Right;  CDE:  1.65  . DILATION AND CURETTAGE OF UTERUS  1970; 1976  . ESOPHAGEAL DILATION  multiple  . FOOT SURGERY Left ~ 2011   "straightened toe & scraped bone below big toe"  . FOOT SURGERY Right ~ 2011   "scraped bone of big toe; shortened middle toet"  . HIATAL HERNIA REPAIR  1990   "had to have scar tissue removed 6 months after repair"  . Inverted nipples  1992  . LAPAROSCOPIC CHOLECYSTECTOMY  1980's  . LIPOSUCTION  1992  . SALPINGOOPHORECTOMY Bilateral    "6 months or so after hysterectomy"  . SKIN CANCER EXCISION  1990's   "front side of right shin"  . TUBAL LIGATION  1976  . VENTRAL HERNIA REPAIR  07/29/2011   Procedure: LAPAROSCOPIC VENTRAL HERNIA;  Surgeon: Adin Hector, MD;  Location: [facility];  Service: General;  Laterality: N/A;  multiple incarcerated hernias with mesh   Family History:  Family History  Problem Relation Age of Onset  . Crohn's disease Mother   . Colitis Mother        Crohns  . Anesthesia problems Mother   . Arthritis Mother        rheumatoid; maternal side of family also with RA  . Pneumonia Mother   . Diabetes Father   . Heart disease Father   . Melanoma Father   . Coronary artery disease Father   . Skin cancer Father   . Stroke Father   . Diabetes Sister   . Migraines Sister   . Depression Maternal Uncle   . Dementia  Maternal Uncle   . Colon cancer Paternal Uncle        ? if stomach or colon   . Hypotension Neg Hx   . Malignant hyperthermia Neg Hx   . Pseudochol deficiency Neg Hx    Family Psychiatric  History: Denies Social History:  Social History   Substance and Sexual Activity  Alcohol Use Never  . Alcohol/week: 0.0 standard drinks     Social History   Substance and Sexual Activity  Drug Use Never    Social History   Socioeconomic History  . Marital status: Married    Spouse name: Leroy Sea  . Number of children: 1  . Years of education: 12th   . Highest education level: Not on file  Occupational History  . Occupation: Retired    Fish farm manager: DISABLED  Tobacco Use  . Smoking  status: Never Smoker  . Smokeless tobacco: Never Used  Substance and Sexual Activity  . Alcohol use: Never    Alcohol/week: 0.0 standard drinks  . Drug use: Never  . Sexual activity: Yes    Birth control/protection: Surgical  Other Topics Concern  . Not on file  Social History Narrative   Pt recently married to [place].  She lives in [place] with husband.  Pt is not followed by psychiatrist.  She is retired.   Social Determinants of Health   Financial Resource Strain:   . Difficulty of Paying Living Expenses: Not on file  Food Insecurity:   . Worried About Charity fundraiser in the Last Year: Not on file  . Ran Out of Food in the Last Year: Not on file  Transportation Needs:   . Lack of Transportation (Medical): Not on file  . Lack of Transportation (Non-Medical): Not on file  Physical Activity:   . Days of Exercise per Week: Not on file  . Minutes of Exercise per Session: Not on file  Stress:   . Feeling of Stress : Not on file  Social Connections:   . Frequency of Communication with Friends and Family: Not on file  . Frequency of Social Gatherings with Friends and Family: Not on file  . Attends Religious Services: Not on file  . Active Member of Clubs or Organizations: Not on file  .  Attends Archivist Meetings: Not on file  . Marital Status: Not on file   Additional Social History:    Allergies:   Allergies  Allergen Reactions  . Gemfibrozil Swelling    REACTION: Angioedema  . Latex Other (See Comments)    Blisters where touched or applied  . Penicillins Hives    Will spread in patches all over the body. Did it involve swelling of the face/tongue/throat, SOB, or low BP? No Did it involve sudden or severe rash/hives, skin peeling, or any reaction on the inside of your mouth or nose? No Did you need to seek medical attention at a hospital or doctor's office? Yes When did it last happen? If all above answers are "NO", may proceed with cephalosporin use.   . Adhesive [Tape] Other (See Comments)    Will blister skin where applied - do not use BAND-AIDS.  Marland Kitchen Codeine Hives    Will spread in patches all over the body.  . Digoxin And Related     Makes BP drop  . Metformin And Related Diarrhea  . Omeprazole Diarrhea  . Ace Inhibitors Rash    Cough and rash     Labs:  Results for orders placed or performed during the hospital encounter of 03/22/19 (from the past 48 hour(s))  Rapid urine drug screen (hospital performed)     Status: Abnormal   Collection Time: 03/22/19  5:38 PM  Result Value Ref Range   Opiates NONE DETECTED NONE DETECTED   Cocaine NONE DETECTED NONE DETECTED   Benzodiazepines POSITIVE (A) NONE DETECTED   Amphetamines NONE DETECTED NONE DETECTED   Tetrahydrocannabinol NONE DETECTED NONE DETECTED   Barbiturates NONE DETECTED NONE DETECTED    Comment: (NOTE) DRUG SCREEN FOR MEDICAL PURPOSES ONLY.  IF CONFIRMATION IS NEEDED FOR ANY PURPOSE, NOTIFY LAB WITHIN 5 DAYS. LOWEST DETECTABLE LIMITS FOR URINE DRUG SCREEN Drug Class                     Cutoff (ng/mL) Amphetamine and metabolites    1000 Barbiturate and metabolites  200 Benzodiazepine                 [id number] Tricyclics and metabolites     300 Opiates and metabolites         300 Cocaine and metabolites        300 THC                            50 Performed at [facility], [place] [address] [place], [place] [address]   CBC with Differential     Status: None   Collection Time: 03/22/19  5:41 PM  Result Value Ref Range   WBC 9.3 4.0 - 10.5 K/uL   RBC 4.74 3.87 - 5.11 MIL/uL   Hemoglobin 15.0 12.0 - 15.0 g/dL   HCT 43.8 36.0 - 46.0 %   MCV 92.4 80.0 - 100.0 fL   MCH 31.6 26.0 - 34.0 pg   MCHC 34.2 30.0 - 36.0 g/dL   RDW 12.4 11.5 - 15.5 %   Platelets 212 150 - 400 K/uL   nRBC 0.0 0.0 - 0.2 %   Neutrophils Relative % 74 %   Neutro Abs 6.9 1.7 - 7.7 K/uL   Lymphocytes Relative 18 %   Lymphs Abs 1.6 0.7 - 4.0 K/uL   Monocytes Relative 8 %   Monocytes Absolute 0.8 0.1 - 1.0 K/uL   Eosinophils Relative 0 %   Eosinophils Absolute 0.0 0.0 - 0.5 K/uL   Basophils Relative 0 %   Basophils Absolute 0.0 0.0 - 0.1 K/uL   Immature Granulocytes 0 %   Abs Immature Granulocytes 0.02 0.00 - 0.07 K/uL    Comment: Performed at [facility], [place] [address], [place], [place] [address]  Comprehensive metabolic panel     Status: Abnormal   Collection Time: 03/22/19  5:41 PM  Result Value Ref Range   Sodium 136 135 - 145 mmol/L   Potassium 4.0 3.5 - 5.1 mmol/L   Chloride 101 98 - 111 mmol/L   CO2 25 22 - 32 mmol/L   Glucose, Bld 260 (H) 70 - 99 mg/dL   BUN 10 8 - 23 mg/dL   Creatinine, Ser 0.72 0.44 - 1.00 mg/dL   Calcium 9.4 8.9 - 10.3 mg/dL   Total Protein 7.4 6.5 - 8.1 g/dL   Albumin 4.0 3.5 - 5.0 g/dL   AST 30 15 - 41 U/L   ALT 25 0 - 44 U/L   Alkaline Phosphatase 72 38 - 126 U/L   Total Bilirubin 1.4 (H) 0.3 - 1.2 mg/dL   GFR calc non Af Amer >60 >60 mL/min   GFR calc Af Amer >60 >60 mL/min   Anion gap 10 5 - 15    Comment: Performed at [facility], [place] [address] [place], [place] [address]  Troponin I (High Sensitivity)     Status: None   Collection Time: 03/22/19  5:41 PM  Result Value Ref  Range   Troponin I (High Sensitivity) <2 <18 ng/L    Comment: (NOTE) Elevated high sensitivity troponin I (hsTnI) values and significant  changes across serial measurements may suggest ACS but many other  chronic and acute conditions are known to elevate hsTnI results.  Refer to the Links section for chest pain algorithms and additional  guidance. Performed at [facility], [place] [address], [place], [place] [address]   Ethanol     Status: None   Collection  Time: 03/22/19  5:41 PM  Result Value Ref Range   Alcohol, Ethyl (B) <10 <10 mg/dL    Comment: (NOTE) Lowest detectable limit for serum alcohol is 10 mg/dL. For medical purposes only. Performed at [facility], [place] [address], [place], [place] [id number]   Salicylate level     Status: Abnormal   Collection Time: 03/22/19  5:41 PM  Result Value Ref Range   Salicylate Lvl [id number] (L) 7.0 - 30.0 mg/dL    Comment: Performed at [facility], [place] [address] [place], [place] [address]  Acetaminophen level     Status: Abnormal   Collection Time: 03/22/19  5:41 PM  Result Value Ref Range   Acetaminophen (Tylenol), Serum <10 (L) 10 - 30 ug/mL    Comment: (NOTE) Therapeutic concentrations vary significantly. A range of 10-30 ug/mL  may be an effective concentration for many patients. However, some  are best treated at concentrations outside of this range. Acetaminophen concentrations >150 ug/mL at 4 hours after ingestion  and >50 ug/mL at 12 hours after ingestion are often associated with  toxic reactions. Performed at [facility], [place] [address], [place], [place] [address]   Respiratory Panel by RT PCR (Flu A&B, Covid) - Nasopharyngeal Swab     Status: None   Collection Time: 03/22/19 11:09 PM   Specimen: Nasopharyngeal Swab  Result Value Ref Range   SARS Coronavirus 2 by RT PCR NEGATIVE NEGATIVE    Comment: (NOTE) SARS-CoV-2 target nucleic acids are NOT  DETECTED. The SARS-CoV-2 RNA is generally detectable in upper respiratoy specimens during the acute phase of infection. The lowest concentration of SARS-CoV-2 viral copies this assay can detect is 131 copies/mL. A negative result does not preclude SARS-Cov-2 infection and should not be used as the sole basis for treatment or other patient management decisions. A negative result may occur with  improper specimen collection/handling, submission of specimen other than nasopharyngeal swab, presence of viral mutation(s) within the areas targeted by this assay, and inadequate number of viral copies (<131 copies/mL). A negative result must be combined with clinical observations, patient history, and epidemiological information. The expected result is Negative. Fact Sheet for Patients:  PinkCheek.be Fact Sheet for Healthcare Providers:  GravelBags.it This test is not yet ap proved or cleared by the [place] FDA and  has been authorized for detection and/or diagnosis of SARS-CoV-2 by FDA under an Emergency Use Authorization (EUA). This EUA will remain  in effect (meaning this test can be used) for the duration of the COVID-19 declaration under Section 564(b)(1) of the Act, 21 U.S.C. section 360bbb-3(b)(1), unless the authorization is terminated or revoked sooner.    Influenza A by PCR NEGATIVE NEGATIVE   Influenza B by PCR NEGATIVE NEGATIVE    Comment: (NOTE) The Xpert Xpress SARS-CoV-2/FLU/RSV assay is intended as an aid in  the diagnosis of influenza from Nasopharyngeal swab specimens and  should not be used as a sole basis for treatment. Nasal washings and  aspirates are unacceptable for Xpert Xpress SARS-CoV-2/FLU/RSV  testing. Fact Sheet for Patients: PinkCheek.be Fact Sheet for Healthcare Providers: GravelBags.it This test is not yet approved or cleared by the  [place] FDA and  has been authorized for detection and/or diagnosis of SARS-CoV-2 by  FDA under an Emergency Use Authorization (EUA). This EUA will remain  in effect (meaning this test can be used) for the duration of the  Covid-19 declaration under Section 564(b)(1) of the Act, 21  U.S.C. section  360bbb-3(b)(1), unless the authorization is  terminated or revoked. Performed at [facility], [place] [address] [place], [place] [address]   CBG monitoring, ED     Status: Abnormal   Collection Time: 03/24/19  7:24 AM  Result Value Ref Range   Glucose-Capillary 196 (H) 70 - 99 mg/dL    Medications:  Current Facility-Administered Medications  Medication Dose Route Frequency Provider Last Rate Last Admin  . acetaminophen (TYLENOL) tablet 500 mg  500 mg Oral Q6H PRN Drenda Freeze, MD   500 mg at 03/23/19 1517  . atenolol (TENORMIN) tablet 37.5 mg  37.5 mg Oral Daily Drenda Freeze, MD   37.5 mg at 03/24/19 0944  . baclofen (LIORESAL) tablet 5-10 mg  5-10 mg Oral TID PRN Drenda Freeze, MD   10 mg at 03/23/19 1518  . diazepam (VALIUM) tablet 5 mg  5 mg Oral Q12H PRN Drenda Freeze, MD   5 mg at 03/23/19 1517  . diltiazem (CARDIZEM CD) 24 hr capsule 180 mg  180 mg Oral Daily Drenda Freeze, MD   180 mg at 03/24/19 0944  . docusate sodium (COLACE) capsule 100 mg  100 mg Oral Daily PRN Drenda Freeze, MD      . DULoxetine (CYMBALTA) DR capsule 60 mg  60 mg Oral Daily Drenda Freeze, MD   60 mg at 03/24/19 0943  . famotidine (PEPCID) tablet 20 mg  20 mg Oral Daily Drenda Freeze, MD   20 mg at 03/24/19 0943  . hydrOXYzine (ATARAX/VISTARIL) tablet 50 mg  50 mg Oral Daily PRN Drenda Freeze, MD   50 mg at 03/23/19 2051  . levothyroxine (SYNTHROID) tablet 50 mcg  50 mcg Oral Q0600 Drenda Freeze, MD   50 mcg at 03/24/19 0602  . losartan (COZAAR) tablet 25 mg  25 mg Oral Daily Drenda Freeze, MD   25 mg at 03/24/19 0944  . metFORMIN  (GLUCOPHAGE-XR) 24 hr tablet 500 mg  500 mg Oral BID WC Drenda Freeze, MD   500 mg at 03/23/19 1847  . naproxen (NAPROSYN) tablet 500 mg  500 mg Oral BID PRN Drenda Freeze, MD   500 mg at 03/23/19 1015  . rivaroxaban (XARELTO) tablet 20 mg  20 mg Oral Q supper Drenda Freeze, MD   20 mg at 03/23/19 1847  . rosuvastatin (CRESTOR) tablet 20 mg  20 mg Oral Daily Drenda Freeze, MD   20 mg at 03/24/19 [id number]   Current Outpatient Medications  Medication Sig Dispense Refill  . acetaminophen (TYLENOL) 500 MG tablet Take 500 mg by mouth every 6 (six) hours as needed for mild pain or headache.    Marland Kitchen atenolol (TENORMIN) 25 MG tablet Take 1.5 tablets (37.5 mg total) by mouth daily. TAKE 1 AND 1/2 TABLET TWICE A DAY 135 tablet 3  . baclofen (LIORESAL) 10 MG tablet Take 0.5-1 tablets (5-10 mg total) by mouth 3 (three) times daily as needed for muscle spasms. 90 each 3  . Cholecalciferol (VITAMIN D3 PO) Take 5,000 Int'l Units by mouth daily.    . diazepam (VALIUM) 5 MG tablet Take 1 tablet (5 mg total) by mouth every 12 (twelve) hours as needed (dizziness). 10 tablet 0  . diltiazem (CARDIZEM CD) 180 MG 24 hr capsule Take 1 capsule (180 mg total) by mouth daily. 90 capsule 3  . docusate sodium (COLACE) 100 MG capsule Take 100 mg by mouth daily as needed for mild  constipation.    . DULoxetine (CYMBALTA) 60 MG capsule Take 1 capsule (60 mg total) by mouth daily. 90 capsule 3  . famotidine (PEPCID) 20 MG tablet Take 1 tablet (20 mg total) by mouth daily. 90 tablet 3  . hydrOXYzine (ATARAX/VISTARIL) 50 MG tablet Take 1 tablet (50 mg total) by mouth daily as needed. 30 tablet 0  . levothyroxine (SYNTHROID) 50 MCG tablet Take 1 tablet (50 mcg total) by mouth every morning. 90 tablet 3  . losartan (COZAAR) 25 MG tablet Take 1 tablet (25 mg total) by mouth daily. 90 tablet 3  . metFORMIN (GLUCOPHAGE-XR) 500 MG 24 hr tablet Take 1 tablet with breakfast for the next 2 weeks THEN increase to 1 tablet twice  daily with food for 2 weeks THEN increase to 2 tablets with breakfast and 1 tablet with dinner for 2 weeks THEN increase to 2 tablets twice daily with food. 360 tablet 0  . Multiple Vitamin (MULTIVITAMIN) tablet Take 1 tablet by mouth daily.    . ondansetron (ZOFRAN-ODT) 4 MG disintegrating tablet Take 1 tablet (4 mg total) by mouth every 8 (eight) hours as needed for nausea. 30 tablet 3  . PE-diphenhydrAMINE-DM-GG-APAP (MUCINEX FAST-MAX DAY/NIGHT PO) Take 2 tablets by mouth every 6 (six) hours as needed (cold symptoms).    . Phenylephrine-Pheniramine-DM (THERAFLU COLD & COUGH) 12-27-18 MG PACK Take 1 Package by mouth every 6 (six) hours as needed (cough).    . Propylene Glycol (SYSTANE BALANCE) 0.6 % SOLN Place 1 drop into both eyes daily as needed (dry eyes).    . rivaroxaban (XARELTO) 20 MG TABS tablet TAKE 1 TABLET DAILY WITH SUPPER (Patient taking differently: Take 20 mg by mouth daily with supper. ) 90 tablet 1  . rosuvastatin (CRESTOR) 20 MG tablet Take 1 tablet (20 mg total) by mouth daily. 90 tablet 3  . ondansetron (ZOFRAN) 4 MG tablet Take 1 tablet (4 mg total) by mouth 4 (four) times daily as needed for nausea or vomiting. (Patient not taking: Reported on 03/22/2019) 10 tablet 0    Musculoskeletal: Strength & Muscle Tone: within normal limits Gait & Station: normal Patient leans: N/A  Psychiatric Specialty Exam: Physical Exam  Nursing note and vitals reviewed. Constitutional: She is oriented to person, place, and time. She appears well-developed.  HENT:  Head: Normocephalic.  Cardiovascular: Normal rate.  Respiratory: Effort normal.  Neurological: She is alert and oriented to person, place, and time.  Psychiatric: Her speech is normal and behavior is normal. Judgment normal. Cognition and memory are normal. She exhibits a depressed mood. She expresses suicidal ideation.    Review of Systems  Constitutional: Negative.   HENT: Negative.   Eyes: Negative.   Respiratory:  Negative.   Cardiovascular: Negative.   Gastrointestinal: Negative.   Genitourinary: Negative.   Musculoskeletal: Negative.   Skin: Negative.   Neurological: Negative.   Psychiatric/Behavioral: Positive for suicidal ideas.    Blood pressure 128/77, pulse 84, temperature 97.8 F (36.6 C), temperature source Oral, resp. rate 18, last menstrual period 06/03/1978, SpO2 100 %.There is no height or weight on file to calculate BMI.  General Appearance: Casual and Fairly Groomed  Eye Contact:  Good  Speech:  Clear and Coherent and Normal Rate  Volume:  Normal  Mood:  Depressed  Affect:  Depressed and Tearful  Thought Process:  Coherent, Goal Directed and Descriptions of Associations: Intact  Orientation:  Full (Time, Place, and Person)  Thought Content:  WDL, Logical and Rumination  Suicidal Thoughts:  Yes.  without intent/plan  Homicidal Thoughts:  No  Memory:  Immediate;   Good Recent;   Good Remote;   Good  Judgement:  Fair  Insight:  Fair  Psychomotor Activity:  Normal  Concentration:  Concentration: Good and Attention Span: Good  Recall:  Good  Fund of Knowledge:  Good  Language:  Good  Akathisia:  No  Handed:  Right  AIMS (if indicated):     Assets:  Communication Skills Desire for Improvement Financial Resources/Insurance Housing Intimacy Leisure Time Physical Health Resilience Social Support  ADL's:  Intact  Cognition:  WNL  Sleep:        Treatment Plan Summary: Daily contact with patient to assess and evaluate symptoms and progress in treatment  Disposition: Recommend psychiatric Inpatient admission when medically cleared. Supportive therapy provided about ongoing stressors. Discussed crisis plan, support from social network, calling 911, coming to the Emergency Department, and calling Suicide Hotline.  This service was provided via telemedicine using a 2-way, interactive audio and video technology.  Names of all persons participating in this telemedicine  service and their role in this encounter. Name: [name] Role: Patient  Name: Letitia Libra Role: [place], [place] 03/24/2019 3:22 PM

## 2019-03-28 ENCOUNTER — Telehealth: Payer: Self-pay | Admitting: Licensed Clinical Social Worker

## 2019-03-28 ENCOUNTER — Ambulatory Visit: Payer: PPO | Admitting: Licensed Clinical Social Worker

## 2019-03-28 NOTE — Telephone Encounter (Signed)
Patient was called, no answer. Patient's husband was called, and he reported that the patient is currently in an inpatient setting for [facility]. Patient was rescheduled to 1/21 if she is discharged.

## 2019-03-30 ENCOUNTER — Ambulatory Visit (INDEPENDENT_AMBULATORY_CARE_PROVIDER_SITE_OTHER): Payer: PPO | Admitting: Licensed Clinical Social Worker

## 2019-03-30 ENCOUNTER — Other Ambulatory Visit: Payer: Self-pay

## 2019-03-30 ENCOUNTER — Encounter: Payer: Self-pay | Admitting: Licensed Clinical Social Worker

## 2019-03-30 ENCOUNTER — Ambulatory Visit (INDEPENDENT_AMBULATORY_CARE_PROVIDER_SITE_OTHER): Payer: PPO | Admitting: Internal Medicine

## 2019-03-30 ENCOUNTER — Encounter: Payer: Self-pay | Admitting: Internal Medicine

## 2019-03-30 VITALS — BP 146/76 | HR 89 | Temp 98.4°F | Wt 172.8 lb

## 2019-03-30 DIAGNOSIS — Z79899 Other long term (current) drug therapy: Secondary | ICD-10-CM

## 2019-03-30 DIAGNOSIS — E119 Type 2 diabetes mellitus without complications: Secondary | ICD-10-CM | POA: Diagnosis not present

## 2019-03-30 DIAGNOSIS — E1121 Type 2 diabetes mellitus with diabetic nephropathy: Secondary | ICD-10-CM

## 2019-03-30 DIAGNOSIS — F4323 Adjustment disorder with mixed anxiety and depressed mood: Secondary | ICD-10-CM

## 2019-03-30 DIAGNOSIS — F329 Major depressive disorder, single episode, unspecified: Secondary | ICD-10-CM

## 2019-03-30 DIAGNOSIS — F32A Depression, unspecified: Secondary | ICD-10-CM

## 2019-03-30 DIAGNOSIS — Z7984 Long term (current) use of oral hypoglycemic drugs: Secondary | ICD-10-CM

## 2019-03-30 NOTE — BH Specialist Note (Signed)
Integrated Behavioral Health Visit via Telemedicine (Telephone)  03/30/2019 [name] [name] [id number]   Session Start time: 12:00  Session End time: 12:30 Total time: 30 minutes  Referring Provider: Dr. Tarri Abernethy Type of Visit: Telephonic Patient location: Home [facility] Provider location: Office All persons participating in visit: Patient and [facility]  Confirmed patient's address: Yes  Confirmed patient's phone number: Yes  Any changes to demographics: No   Discussed confidentiality: Yes    The following statements were read to the patient and/or legal guardian that are established with the [facility] Provider.  "The purpose of this phone visit is to provide behavioral health care while limiting exposure to the coronavirus (COVID19).  There is a possibility of technology failure and discussed alternative modes of communication if that failure occurs."  "By engaging in this telephone visit, you consent to the provision of healthcare.  Additionally, you authorize for your insurance to be billed for the services provided during this telephone visit."   Patient and/or legal guardian consented to telephone visit: Yes   PRESENTING CONCERNS: Patient and/or family reports the following symptoms/concerns: grief, depression, history of fleeting SI, anxiety, and chronic health issues.  Duration of problem: increased over the past four months; Severity of problem: moderate  GOALS ADDRESSED: Patient will: 1.  Reduce symptoms of: anxiety, depression and stress  2.  Increase knowledge and/or ability of: coping skills, healthy habits and stress reduction  3.  Demonstrate ability to: Increase healthy adjustment to current life circumstances, Increase adequate support systems for patient/family and Begin healthy grieving over loss  INTERVENTIONS: Interventions utilized:  Brief CBT, Supportive Counseling and Link [employer] Standardized Assessments completed: assessed for SI, HI,  and self-harm.  ASSESSMENT: Patient currently experiencing moderate levels of depression and grief. Patient was recently hospitalized for suicidial ideation, and she is now back home. Patient is reporting no current SI, HI, or self-harm, and has no access to weapons. Patient was provided with the contact information for grief counseling, and two local psychiatrist (per her request). Patient has made progress towards the acceptance of her daughter's passing. Patient sounded grounded on the phone, and has her husband to support her.    Patient may benefit from counseling.  PLAN: 1. Follow up with behavioral health clinician on : one week.    Dessie Coma, [facility], [place]

## 2019-03-30 NOTE — Progress Notes (Signed)
   CC: MDD  HPI:  [name] is a 69 y.o. female with PMHx listed below presenting for MDD. Please see the A&P for the status of the patient's chronic medical problems.  Past Medical History:  Diagnosis Date  . Abnormal CT scan 03/16/2017  . Atrial fibrillation ([place])   . Bunion   . Carotid stenosis   . Carpal tunnel syndrome    mild   . Cervical arthritis   . Cholecystitis    s/p cholecystectomy  . Chronic back pain   . Chronic gastritis   . Depression   . Diabetic peripheral neuropathy ([place])   . Diarrhea 04/05/2015  . Digoxin toxicity    08/2011  . Diverticulitis    dx 04/08/14  . Diverticulosis   . Dysphagia    no documented strictures but responded positively to dilation in past.   . Epileptic seizure, tonic ([place])    .No meds since age of 63.  . Family history of adverse reaction to anesthesia    "my mother may have"  . Fatty liver   . Fibromyalgia   . GERD (gastroesophageal reflux disease)   . H/O hiatal hernia    S/P hernia repair  . Hyperlipidemia   . Hypertension   . Hypothyroid   . Internal hemorrhoid   . Joint pain 05/08/2011  . Left knee pain 12/19/2014  . Migraine   . Mild cognitive impairment   . Muscle spasm of back 08/10/2013  . Nonspecific (abnormal) findings on radiological and other examination of gastrointestinal tract 08/07/2011  . OSA (obstructive sleep apnea) dx'd 2003   "can't sleep w/that mask; lost some weight; maybe that's helped" (04/26/2014)  . Osteoarthritis   . Pneumonia X 2  . Right foot pain 08/03/2013  . Sarcoidosis   . Skin neoplasm   . TIA (transient ischemic attack)    "don't know when I had it" (04/26/2014)  . Transaminase or LDH elevation   . Type II diabetes mellitus ([place])   . Ventral hernia s/p laparoscopic lysis of adhesions and hernia repair with mesh 07/29/11 08/01/2011   Review of Systems:  Performed and all others negative.  Physical Exam: Vitals:   03/30/19 1556  BP: (!) 146/76  Pulse: 89  Temp: 98.4 F  (36.9 C)  TempSrc: Oral  SpO2: 99%  Weight: 172 lb 12.8 oz (78.4 kg)   General: Well nourished female in no acute distress Pulm: Good air movement with no wheezing or crackles  CV: RRR, no murmurs, no rubs   Assessment & Plan:   See Encounters Tab for problem based charting.  Patient discussed with Dr. Dareen Piano

## 2019-03-30 NOTE — Patient Instructions (Signed)
Thank you for allowing [place] to provide. Please schedule an appouintment with [name] as soon as you can. Thank you for bringing in your medications. Continue to increase your Metformin as previously described.   Call Dr. Sarina Ill to discuss your headaches  [facility] [address] [place], [place] [address]  Phone [phone] Fax [phone]  Come back to see me in 4 weeks or sooner if any issues arise.

## 2019-03-31 NOTE — Assessment & Plan Note (Signed)
Patient recently admitted to behavioral health for depression and suicidal ideations. She stayed for a week. Feels she is doing much better. No medication changes. Denies SI/HI. Has a good family support system.   A/P: - Continue duloxetine 60 mg QD. PHQ-9 is at 7 today.  - She will follow-up with Miquel Dunn for counseling.

## 2019-03-31 NOTE — Assessment & Plan Note (Signed)
Uncontrolled DM. She was given a prescription for metformin at her last visit. She has not increased this beyond the 500 mg QD. She is not exercising. She has not modified her diet.   A/P: - Increase metformin dose to 1000 mg BID  - Follow-up in 3 months for repeat A1c  - Continue Losartan  - Continue Crestor  - If A1c is elevated at next visit we will add a SLGT-2 or GLP

## 2019-04-03 NOTE — Progress Notes (Signed)
Internal Medicine Clinic Attending  Case discussed with Dr. Helberg at the time of the visit.  We reviewed the resident's history and exam and pertinent patient test results.  I agree with the assessment, diagnosis, and plan of care documented in the resident's note.    

## 2019-04-05 ENCOUNTER — Encounter: Payer: Self-pay | Admitting: Licensed Clinical Social Worker

## 2019-04-05 ENCOUNTER — Other Ambulatory Visit: Payer: Self-pay

## 2019-04-05 ENCOUNTER — Ambulatory Visit (INDEPENDENT_AMBULATORY_CARE_PROVIDER_SITE_OTHER): Payer: PPO | Admitting: Licensed Clinical Social Worker

## 2019-04-05 ENCOUNTER — Ambulatory Visit: Payer: PPO | Admitting: Licensed Clinical Social Worker

## 2019-04-05 DIAGNOSIS — F4323 Adjustment disorder with mixed anxiety and depressed mood: Secondary | ICD-10-CM

## 2019-04-05 NOTE — BH Specialist Note (Signed)
Integrated Behavioral Health Visit via Telemedicine (Telephone)  04/05/2019 [name] [name] [id number]   Session Start time: 10:00  Session End time: 10:30 Total time: 30 minutes  Referring Provider: Dr. Tarri Abernethy Type of Visit: Telephonic Patient location: Home [facility] Provider location: Office All persons participating in visit: Patient and [facility]  Confirmed patient's address: Yes  Confirmed patient's phone number: Yes  Any changes to demographics: No   Discussed confidentiality: Yes    The following statements were read to the patient and/or legal guardian that are established with the [facility] Provider.  "The purpose of this phone visit is to provide behavioral health care while limiting exposure to the coronavirus (COVID19).  There is a possibility of technology failure and discussed alternative modes of communication if that failure occurs."  "By engaging in this telephone visit, you consent to the provision of healthcare.  Additionally, you authorize for your insurance to be billed for the services provided during this telephone visit."   Patient and/or legal guardian consented to telephone visit: Yes   PRESENTING CONCERNS: Patient and/or family reports the following symptoms/concerns: grief, depression, history of fleeting SI, anxiety, and chronic health issues. Severity of problem: moderate  GOALS ADDRESSED: Patient will: 1.  Reduce symptoms of: anxiety, depression, stress and grief  2.  Increase knowledge and/or ability of: coping skills, healthy habits and stress reduction  3.  Demonstrate ability to: Increase healthy adjustment to current life circumstances, Increase adequate support systems for patient/family, Increase motivation to adhere to plan of care and Begin healthy grieving over loss  INTERVENTIONS: Interventions utilized:  Mindfulness or Relaxation Training, Brief CBT and Supportive Counseling Standardized Assessments completed: assessed for SI, HI,  and self-harm.  ASSESSMENT: Patient currently experiencing moderate levels of anxiety and grief.  Patient is making improvements with staying in the present moment, and moving closer to the acceptance phase of grief. Patient is having no thoughts of hurting herself or others. Patient continues to worry, and could benefit from challenging anxious thoughts. Patient is contemplating getting a dog as a companion.   Patient may benefit from counseling.  PLAN: 1. Follow up with behavioral health clinician on : one week.   Dessie Coma, [facility], [place]

## 2019-04-13 ENCOUNTER — Encounter: Payer: Self-pay | Admitting: Licensed Clinical Social Worker

## 2019-04-13 ENCOUNTER — Other Ambulatory Visit: Payer: Self-pay

## 2019-04-13 ENCOUNTER — Ambulatory Visit (INDEPENDENT_AMBULATORY_CARE_PROVIDER_SITE_OTHER): Payer: PPO | Admitting: Licensed Clinical Social Worker

## 2019-04-13 DIAGNOSIS — F4323 Adjustment disorder with mixed anxiety and depressed mood: Secondary | ICD-10-CM

## 2019-04-13 NOTE — BH Specialist Note (Signed)
Integrated Behavioral Health Visit via Telemedicine (Telephone)  04/13/2019 [place] [id number]   Session Start time: 12:40  Session End time: 1:10 Total time: 30 minutes  Referring Provider: Dr. Tarri Abernethy Type of Visit: Telephonic Patient location: home [facility] Provider location: Office All persons participating in visit: patient and [facility]  Confirmed patient's address: Yes  Confirmed patient's phone number: Yes  Any changes to demographics: No   Discussed confidentiality: Yes    The following statements were read to the patient and/or legal guardian that are established with the [facility] Provider.  "The purpose of this phone visit is to provide behavioral health care while limiting exposure to the coronavirus (COVID19).  There is a possibility of technology failure and discussed alternative modes of communication if that failure occurs."  "By engaging in this telephone visit, you consent to the provision of healthcare.  Additionally, you authorize for your insurance to be billed for the services provided during this telephone visit."   Patient and/or legal guardian consented to telephone visit: Yes   PRESENTING CONCERNS: Patient and/or family reports the following symptoms/concerns: grief, depression, history of fleeting SI, anxiety, and chronic health issues.   Severity of problem: moderate  GOALS ADDRESSED: Patient will: 1.  Reduce symptoms of: anxiety, depression and stress  2.  Increase knowledge and/or ability of: coping skills, healthy habits and stress reduction  3.  Demonstrate ability to: Increase healthy adjustment to current life circumstances, Increase adequate support systems for patient/family and Begin healthy grieving over loss  INTERVENTIONS: Interventions utilized:  Mindfulness or Relaxation Training, Brief CBT and Supportive Counseling Standardized Assessments completed: assessed for SI, HI, and self-harm.  ASSESSMENT: Patient currently  experiencing mild to moderate levels of anxiety and depression. Patient continues to have moments where she gets overwhelmed, but she tries to shift her mind.  Patient has issues sleeping from time to time, and reported this is due to her worrying. Patient is continuing to grieve the loss of her daughter. Patient is able to speak about her daughter now without having as much emotional distress. Patient has recently loss friends due to Covid, and processed her fears/feelings about that. Patient's mood has improved since one month ago. Patient is able to focus on the positive more often, and is working towards accepting life circumstances.   Patient may benefit from counseling.  PLAN: 1. Follow up with behavioral health clinician on : two weeks.   Dessie Coma, [facility], [place]

## 2019-05-02 ENCOUNTER — Encounter: Payer: Self-pay | Admitting: Licensed Clinical Social Worker

## 2019-05-02 ENCOUNTER — Other Ambulatory Visit: Payer: Self-pay

## 2019-05-02 ENCOUNTER — Ambulatory Visit (INDEPENDENT_AMBULATORY_CARE_PROVIDER_SITE_OTHER): Payer: PPO | Admitting: Licensed Clinical Social Worker

## 2019-05-02 DIAGNOSIS — F4321 Adjustment disorder with depressed mood: Secondary | ICD-10-CM

## 2019-05-02 NOTE — BH Specialist Note (Signed)
Integrated Behavioral Health Visit via Telemedicine (Telephone)  05/02/2019 [name] [name] [id number]   Session Start time: 11:30  Session End time: 12;00 Total time: 30 minutes  Referring Provider: Dr. Tarri Abernethy Type of Visit: Telephonic Patient location: Home [facility] Provider location: Office All persons participating in visit: Patient and [facility]  Confirmed patient's address: Yes  Confirmed patient's phone number: Yes  Any changes to demographics: No   Discussed confidentiality: Yes    The following statements were read to the patient and/or legal guardian that are established with the [facility] Provider.  "The purpose of this phone visit is to provide behavioral health care while limiting exposure to the coronavirus (COVID19).  There is a possibility of technology failure and discussed alternative modes of communication if that failure occurs."  "By engaging in this telephone visit, you consent to the provision of healthcare.  Additionally, you authorize for your insurance to be billed for the services provided during this telephone visit."   Patient and/or legal guardian consented to telephone visit: Yes   PRESENTING CONCERNS: Patient and/or family reports the following symptoms/concerns: grief, anxiety, depression, and health issues.  Duration of problem: six months; Severity of problem: mild  GOALS ADDRESSED: Patient will: 1.  Reduce symptoms of: anxiety, depression, stress and grief.  2.  Increase knowledge and/or ability of: coping skills, healthy habits and stress reduction  3.  Demonstrate ability to: Increase healthy adjustment to current life circumstances, Increase adequate support systems for patient/family, Increase motivation to adhere to plan of care and Begin healthy grieving over loss  INTERVENTIONS: Interventions utilized:  Motivational Interviewing, Brief CBT and Supportive Counseling Standardized Assessments completed: assessed for SI, HI, and  self-harm.  ASSESSMENT: Patient currently experiencing mild levels. Patient is continuing to grieve the loss of her daughter, and thinks of her frequently. Patient is still experiencing anxiety periodically throughout the day. Patient worries about events that have happened in the past. Patient got a dog, and feels that this has been helpful. Patient's depression has improved since two months ago.   Patient may benefit from counseling.  PLAN: 1. Follow up with behavioral health clinician on : two weeks.   Dessie Coma, [facility], [place]

## 2019-05-04 ENCOUNTER — Other Ambulatory Visit: Payer: Self-pay

## 2019-05-04 ENCOUNTER — Ambulatory Visit (INDEPENDENT_AMBULATORY_CARE_PROVIDER_SITE_OTHER): Payer: PPO | Admitting: Internal Medicine

## 2019-05-04 ENCOUNTER — Encounter: Payer: Self-pay | Admitting: Internal Medicine

## 2019-05-04 VITALS — BP 123/69 | HR 73 | Temp 98.4°F | Ht 63.0 in | Wt 172.3 lb

## 2019-05-04 DIAGNOSIS — E1121 Type 2 diabetes mellitus with diabetic nephropathy: Secondary | ICD-10-CM

## 2019-05-04 DIAGNOSIS — M5136 Other intervertebral disc degeneration, lumbar region: Secondary | ICD-10-CM | POA: Diagnosis not present

## 2019-05-04 DIAGNOSIS — E118 Type 2 diabetes mellitus with unspecified complications: Secondary | ICD-10-CM

## 2019-05-04 DIAGNOSIS — F329 Major depressive disorder, single episode, unspecified: Secondary | ICD-10-CM

## 2019-05-04 DIAGNOSIS — M48061 Spinal stenosis, lumbar region without neurogenic claudication: Secondary | ICD-10-CM | POA: Diagnosis not present

## 2019-05-04 DIAGNOSIS — M47816 Spondylosis without myelopathy or radiculopathy, lumbar region: Secondary | ICD-10-CM | POA: Diagnosis not present

## 2019-05-04 DIAGNOSIS — Z79899 Other long term (current) drug therapy: Secondary | ICD-10-CM

## 2019-05-04 DIAGNOSIS — Z7984 Long term (current) use of oral hypoglycemic drugs: Secondary | ICD-10-CM

## 2019-05-04 DIAGNOSIS — F32A Depression, unspecified: Secondary | ICD-10-CM

## 2019-05-04 DIAGNOSIS — F4321 Adjustment disorder with depressed mood: Secondary | ICD-10-CM

## 2019-05-04 NOTE — Progress Notes (Signed)
   CC: Depression  HPI:  [name] is a 69 y.o. female with PMHx listed below presenting for depression. Please see the A&P for the status of the patient's chronic medical problems.  Past Medical History:  Diagnosis Date  . Abnormal CT scan 03/16/2017  . Atrial fibrillation ([place])   . Bunion   . Carotid stenosis   . Carpal tunnel syndrome    mild   . Cervical arthritis   . Cholecystitis    s/p cholecystectomy  . Chronic back pain   . Chronic gastritis   . Depression   . Diabetic peripheral neuropathy ([place])   . Diarrhea 04/05/2015  . Digoxin toxicity    08/2011  . Diverticulitis    dx 04/08/14  . Diverticulosis   . Dysphagia    no documented strictures but responded positively to dilation in past.   . Epileptic seizure, tonic ([place])    .No meds since age of 59.  . Family history of adverse reaction to anesthesia    "my mother may have"  . Fatty liver   . Fibromyalgia   . GERD (gastroesophageal reflux disease)   . H/O hiatal hernia    S/P hernia repair  . Hyperlipidemia   . Hypertension   . Hypothyroid   . Internal hemorrhoid   . Joint pain 05/08/2011  . Left knee pain 12/19/2014  . Migraine   . Mild cognitive impairment   . Muscle spasm of back 08/10/2013  . Nonspecific (abnormal) findings on radiological and other examination of gastrointestinal tract 08/07/2011  . OSA (obstructive sleep apnea) dx'd 2003   "can't sleep w/that mask; lost some weight; maybe that's helped" (04/26/2014)  . Osteoarthritis   . Pneumonia X 2  . Right foot pain 08/03/2013  . Sarcoidosis   . Skin neoplasm   . TIA (transient ischemic attack)    "don't know when I had it" (04/26/2014)  . Transaminase or LDH elevation   . Type II diabetes mellitus ([place])   . Ventral hernia s/p laparoscopic lysis of adhesions and hernia repair with mesh 07/29/11 08/01/2011   Review of Systems:  Performed and all others negative.  Physical Exam: Vitals:   05/04/19 1322  BP: 123/69  Pulse: 73  Temp:  98.4 F (36.9 C)  TempSrc: Oral  SpO2: 100%  Weight: 172 lb 4.8 oz (78.2 kg)  Height: 5\' 3"  (1.6 m)   General: Well nourished female in no acute distress Pulm: Good air movement with no wheezing or crackles  CV: RRR, no murmurs, no rubs   Assessment & Plan:   See Encounters Tab for problem based charting.  Patient discussed with Dr. Philipp Ovens

## 2019-05-04 NOTE — Assessment & Plan Note (Signed)
Patient with known depression. She is currently on duloxetine 60 mg once daily. She is tolerating the medication well. She continues to follow with [facility]. She continues to get benefit from counseling.  A/P: - PHQ-9 up to 9 today  - Continue duloxetine and counseling

## 2019-05-04 NOTE — Progress Notes (Signed)
Internal Medicine Clinic Attending  Case discussed with Dr. Helberg at the time of the visit.  We reviewed the resident's history and exam and pertinent patient test results.  I agree with the assessment, diagnosis, and plan of care documented in the resident's note.    

## 2019-05-04 NOTE — Patient Instructions (Addendum)
Thank you for allowing me to provide your care. Keep taking all your medications as prescribed and follow-up with Miquel Dunn.   For your back pain call Dr. Junius Roads at [facility] Address: [address], [place], [place] [address] Phone: [phone]  Please come back to see me in 3 months.

## 2019-05-04 NOTE — Assessment & Plan Note (Addendum)
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 She was evaluated by ortho care in 2020. At that point she is recommended physical therapy. Her pain continues to persist. She is tried over-the-counter medications without any significant relief. She denies red flag symptoms at this time including fevers, chills, weight loss, history of malignancy, focal weakness or neurologic change of the lower extremities, change in bladder or bowel function. She does not have a history of IV drug use.   Imaging reviewed today. Her MRI, mild to moderate multilevel facet arthrosis and mild degenerative disc disease at L4-5 without spinal stenosis. Mild right neural foraminal narrowing at L4-5.  A/P: - Uncontrolled  - Will refer back to [employer]

## 2019-05-04 NOTE — Assessment & Plan Note (Signed)
Patient with uncontrolled diabetes. Currently all metformin 1000 mg twice daily. She is having some diarrhea but states that is not bothering her. She is otherwise tolerating the metformin well. She is trying to modify her diet. She is not start exercising.  A/P: - She will follow-up in a couple months for repeat A1c.  - Continue metformin 1000 mg BID along with losartan and Crestor. - Foot exam performed today

## 2019-05-15 ENCOUNTER — Telehealth: Payer: Self-pay | Admitting: Licensed Clinical Social Worker

## 2019-05-15 NOTE — Telephone Encounter (Signed)
Patient was called to discuss moving her appointment to a different day and time. Patient agreed, and will be moved to 3/10 @ 1:30.

## 2019-05-16 ENCOUNTER — Ambulatory Visit: Payer: PPO | Admitting: Licensed Clinical Social Worker

## 2019-05-17 ENCOUNTER — Other Ambulatory Visit: Payer: Self-pay

## 2019-05-17 ENCOUNTER — Encounter: Payer: Self-pay | Admitting: Licensed Clinical Social Worker

## 2019-05-17 ENCOUNTER — Ambulatory Visit (INDEPENDENT_AMBULATORY_CARE_PROVIDER_SITE_OTHER): Payer: PPO | Admitting: Licensed Clinical Social Worker

## 2019-05-17 DIAGNOSIS — F4321 Adjustment disorder with depressed mood: Secondary | ICD-10-CM

## 2019-05-17 NOTE — BH Specialist Note (Signed)
Integrated Behavioral Health Visit via Telemedicine (Telephone)  05/17/2019 [place] [id number]   Session Start time: 1:28  Session End time: 1:58 Total time: 30 minutes  Referring Provider: Dr. Tarri Abernethy Type of Visit: Telephonic Patient location: Home [facility] Provider location: Office All persons participating in visit: Patient, [facility], and [facility] intern  Confirmed patient's address: Yes  Confirmed patient's phone number: Yes  Any changes to demographics: Patient will be changing her last name to [facility].  Discussed confidentiality: Yes    The following statements were read to the patient and/or legal guardian that are established with the [facility] Provider.  "The purpose of this phone visit is to provide behavioral health care while limiting exposure to the coronavirus (COVID19).  There is a possibility of technology failure and discussed alternative modes of communication if that failure occurs."  "By engaging in this telephone visit, you consent to the provision of healthcare.  Additionally, you authorize for your insurance to be billed for the services provided during this telephone visit."   Patient and/or legal guardian consented to telephone visit: Yes   PRESENTING CONCERNS: Patient and/or family reports the following symptoms/concerns: grief, distressing dreams, anxiety, depression, and sleep disturbances. Duration of problem: six months; Severity of problem: mild  GOALS ADDRESSED: Patient will: 1.  Reduce symptoms of: anxiety, depression, insomnia and stress  2.  Increase knowledge and/or ability of: coping skills and healthy habits  3.  Demonstrate ability to: Increase healthy adjustment to current life circumstances, Increase adequate support systems for patient/family and Begin healthy grieving over loss  INTERVENTIONS: Interventions utilized:  Brief CBT and Supportive Counseling Standardized Assessments completed: Not Needed  ASSESSMENT: Patient  currently experiencing mild levels of grief and depression. Patient has difficulty controlling her thoughts when she lays down at night. Patient is missing her daughter, and she is continuing to work through the grief process. Patient is trying to challenge negative thoughts that stem from grief.    Patient may benefit from counseling.  PLAN: 1. Follow up with behavioral health clinician on : one to two weeks.   Dessie Coma, [facility], [place]

## 2019-06-01 ENCOUNTER — Ambulatory Visit: Payer: PPO | Admitting: Family Medicine

## 2019-06-06 ENCOUNTER — Ambulatory Visit (INDEPENDENT_AMBULATORY_CARE_PROVIDER_SITE_OTHER): Payer: PPO | Admitting: Licensed Clinical Social Worker

## 2019-06-06 ENCOUNTER — Telehealth: Payer: Self-pay | Admitting: Licensed Clinical Social Worker

## 2019-06-06 ENCOUNTER — Other Ambulatory Visit: Payer: Self-pay

## 2019-06-06 ENCOUNTER — Encounter: Payer: Self-pay | Admitting: Licensed Clinical Social Worker

## 2019-06-06 DIAGNOSIS — F329 Major depressive disorder, single episode, unspecified: Secondary | ICD-10-CM

## 2019-06-06 DIAGNOSIS — F32A Depression, unspecified: Secondary | ICD-10-CM

## 2019-06-06 NOTE — BH Specialist Note (Signed)
Integrated Behavioral Health Visit via Telemedicine (Telephone)  06/06/2019 [name] [name] [id number]   Session Start time: 8:35  Session End time: 9:00 Total time: 25 minutes  Referring Provider: Dr. Tarri Abernethy Type of Visit: Telephonic Patient location: Home [facility] Provider location: Office All persons participating in visit: [facility] and patient   Confirmed patient's address: Yes  Confirmed patient's phone number: Yes  Any changes to demographics: No   Discussed confidentiality: Yes    The following statements were read to the patient and/or legal guardian that are established with the [facility] Provider.  "The purpose of this phone visit is to provide behavioral health care while limiting exposure to the coronavirus (COVID19).  There is a possibility of technology failure and discussed alternative modes of communication if that failure occurs."  "By engaging in this telephone visit, you consent to the provision of healthcare.  Additionally, you authorize for your insurance to be billed for the services provided during this telephone visit."   Patient and/or legal guardian consented to telephone visit: Yes   PRESENTING CONCERNS: Patient and/or family reports the following symptoms/concerns:grief, distressing dreams, anxiety, depression, and sleep disturbances.  Duration of problem: six months; Severity of problem: moderate  GOALS ADDRESSED: Patient will: 1.  Reduce symptoms of: anxiety, depression, insomnia and stress  2.  Increase knowledge and/or ability of: coping skills, healthy habits and stress reduction  3.  Demonstrate ability to: Increase healthy adjustment to current life circumstances, Increase adequate support systems for patient/family and Begin healthy grieving over loss  INTERVENTIONS: Interventions utilized:  Motivational Interviewing, Brief CBT and Supportive Counseling Standardized Assessments completed: assessed for SI, HI, and  self-hamr.  ASSESSMENT: Patient currently experiencing moderate levels of anxiety and depression. Patient processed recent triggers for anxiety and sadness. Patient processed how taking a step back from activities that trigger her anxiety and depression. Patient is continuing to grieve the loss of her daughter, and has recently decided to find some old pictures to remember her daughter. Patient identified her husband as a support system for her, and relies on him for grounding at times.   Patient may benefit from counseling.  PLAN: 1. Follow up with behavioral health clinician on : two weeks.   Dessie Coma, [facility], [place]

## 2019-06-06 NOTE — Telephone Encounter (Signed)
Patient called for her scheduled visit.

## 2019-06-15 ENCOUNTER — Other Ambulatory Visit: Payer: Self-pay | Admitting: Internal Medicine

## 2019-06-15 DIAGNOSIS — E119 Type 2 diabetes mellitus without complications: Secondary | ICD-10-CM

## 2019-06-15 MED ORDER — METFORMIN HCL ER 500 MG PO TB24: ORAL_TABLET | ORAL | 0 refills | Status: DC

## 2019-06-15 NOTE — Telephone Encounter (Signed)
Need refill on metFORMIN (GLUCOPHAGE-XR) 500 MG 24 hr tablet  ;pt contact [phone]   90 day supply  [facility], [place] [facility] [place]

## 2019-06-21 ENCOUNTER — Ambulatory Visit: Payer: PPO | Admitting: Licensed Clinical Social Worker

## 2019-06-21 ENCOUNTER — Encounter: Payer: Self-pay | Admitting: Licensed Clinical Social Worker

## 2019-06-21 ENCOUNTER — Other Ambulatory Visit: Payer: Self-pay

## 2019-06-21 ENCOUNTER — Ambulatory Visit (INDEPENDENT_AMBULATORY_CARE_PROVIDER_SITE_OTHER): Payer: PPO | Admitting: Licensed Clinical Social Worker

## 2019-06-21 DIAGNOSIS — F329 Major depressive disorder, single episode, unspecified: Secondary | ICD-10-CM

## 2019-06-21 DIAGNOSIS — F32A Depression, unspecified: Secondary | ICD-10-CM

## 2019-06-21 NOTE — BH Specialist Note (Signed)
Integrated Behavioral Health Visit via Telemedicine (Telephone)  06/21/2019 [name] [id number]   Session Start time: 10:35  Session End time: 11:00 Total time: 25 minutes  Referring Provider: Dr. Tarri Abernethy Type of Visit: Telephonic Patient location: Home [facility] Provider location: Office All persons participating in visit: Patient and [facility]  Confirmed patient's address: Yes  Confirmed patient's phone number: Yes  Any changes to demographics: No   Discussed confidentiality: Yes    The following statements were read to the patient and/or legal guardian that are established with the [facility] Provider.  "The purpose of this phone visit is to provide behavioral health care while limiting exposure to the coronavirus (COVID19).  There is a possibility of technology failure and discussed alternative modes of communication if that failure occurs."  "By engaging in this telephone visit, you consent to the provision of healthcare.  Additionally, you authorize for your insurance to be billed for the services provided during this telephone visit."   Patient and/or legal guardian consented to telephone visit: Yes   PRESENTING CONCERNS: Patient and/or family reports the following symptoms/concerns: grief, distressing dreams, anxiety, depression, and sleep disturbances.  Duration of problem: six months; Severity of problem: moderate  GOALS ADDRESSED: Patient will: 1.  Reduce symptoms of: anxiety, depression, insomnia and stress  2.  Increase knowledge and/or ability of: self-management skills and stress reduction  3.  Demonstrate ability to: Increase healthy adjustment to current life circumstances, Increase adequate support systems for patient/family and Begin healthy grieving over loss  INTERVENTIONS: Interventions utilized:  Mindfulness or Relaxation Training, Brief CBT and Supportive Counseling Standardized Assessments completed: assessed for SI, HI, and  self-harm.  ASSESSMENT: Patient currently experiencing moderate levels of anxiety and depression. Patient is stressed due to family triggers. Patient reported recent increased conflict in her marriage. Patient identified that she needs to work on her communication with her husband.  Patient reported that she continues to worry and have ruminations. Patient can benefit from learning how to challenge anxious thoughts, so they do not continue for long periods of time.   Patient may benefit from counseling.  PLAN: 1. Follow up with behavioral health clinician on : one week.  Dessie Coma, [facility], [place]

## 2019-06-26 ENCOUNTER — Encounter: Payer: Self-pay | Admitting: *Deleted

## 2019-07-05 ENCOUNTER — Other Ambulatory Visit: Payer: Self-pay

## 2019-07-05 ENCOUNTER — Encounter: Payer: Self-pay | Admitting: Licensed Clinical Social Worker

## 2019-07-05 ENCOUNTER — Ambulatory Visit (INDEPENDENT_AMBULATORY_CARE_PROVIDER_SITE_OTHER): Payer: PPO | Admitting: Licensed Clinical Social Worker

## 2019-07-05 DIAGNOSIS — F4323 Adjustment disorder with mixed anxiety and depressed mood: Secondary | ICD-10-CM

## 2019-07-05 NOTE — BH Specialist Note (Signed)
Integrated Behavioral Health Visit via Telemedicine (Telephone)  07/05/2019 [name] [name] [id number]   Session Start time: 10:00  Session End time: 10:30 Total time: 30 minutes  Referring Provider: Dr. Tarri Abernethy Type of Visit: Telephonic Patient location: Home [facility] Provider location: Office All persons participating in visit: Patient and [facility]  Confirmed patient's address: Yes  Confirmed patient's phone number: Yes  Any changes to demographics: No   Discussed confidentiality: Yes    The following statements were read to the patient and/or legal guardian that are established with the [facility] Provider.  "The purpose of this phone visit is to provide behavioral health care while limiting exposure to the coronavirus (COVID19).  There is a possibility of technology failure and discussed alternative modes of communication if that failure occurs."  "By engaging in this telephone visit, you consent to the provision of healthcare.  Additionally, you authorize for your insurance to be billed for the services provided during this telephone visit."   Patient and/or legal guardian consented to telephone visit: Yes   PRESENTING CONCERNS: Patient and/or family reports the following symptoms/concerns: grief, distressing dreams, anxiety, depression, and sleep disturbances. Duration of problem: six months; Severity of problem: moderate  GOALS ADDRESSED: Patient will: 1.  Reduce symptoms of: anxiety, depression, insomnia and stress  2.  Increase knowledge and/or ability of: coping skills, healthy habits and stress reduction  3.  Demonstrate ability to: Increase healthy adjustment to current life circumstances, Increase adequate support systems for patient/family and Begin healthy grieving over loss  INTERVENTIONS: Interventions utilized:  Mindfulness or Relaxation Training, Brief CBT and Supportive Counseling Standardized Assessments completed: assessed for SI, HI, and  self-harm.  ASSESSMENT: Patient currently experiencing moderate levels of anxiety and depression. Patient's husband recently had surgery. Patient is dealing with the challenges of him healing from surgery. Patient's anxiety continues to be chronic. Patient reports getting fixated on situations that she cannot change or control. Patient agreed to work on reducing these thought patterns.   Patient reported an improvement with decreased irritability and communication with her spouse.   Patient may benefit from counseling.  PLAN: 1. Follow up with behavioral health clinician on : two weeks.   Dessie Coma, [facility], [place]

## 2019-07-13 ENCOUNTER — Encounter: Payer: Self-pay | Admitting: Gastroenterology

## 2019-07-13 ENCOUNTER — Other Ambulatory Visit (INDEPENDENT_AMBULATORY_CARE_PROVIDER_SITE_OTHER): Payer: PPO

## 2019-07-13 ENCOUNTER — Ambulatory Visit: Payer: PPO | Admitting: Gastroenterology

## 2019-07-13 ENCOUNTER — Encounter: Payer: Self-pay | Admitting: *Deleted

## 2019-07-13 VITALS — BP 110/70 | HR 50 | Temp 96.9°F | Ht 63.0 in | Wt 166.0 lb

## 2019-07-13 DIAGNOSIS — R1032 Left lower quadrant pain: Secondary | ICD-10-CM | POA: Diagnosis not present

## 2019-07-13 DIAGNOSIS — R102 Pelvic and perineal pain: Secondary | ICD-10-CM

## 2019-07-13 LAB — BASIC METABOLIC PANEL
BUN: 9 mg/dL (ref 6–23)
CO2: 28 mEq/L (ref 19–32)
Calcium: 9.9 mg/dL (ref 8.4–10.5)
Chloride: 105 mEq/L (ref 96–112)
Creatinine, Ser: 0.81 mg/dL (ref 0.40–1.20)
GFR: 69.7 mL/min (ref >60.00)
Glucose, Bld: 142 mg/dL — ABNORMAL HIGH (ref 70–99)
Potassium: 4.4 mEq/L (ref 3.5–5.1)
Sodium: 138 mEq/L (ref 135–145)

## 2019-07-13 NOTE — Progress Notes (Signed)
07/13/2019 [name] [id number] 18-May-1950   HISTORY OF PRESENT ILLNESS: This is a pleasant 69 year old female is a patient of Dr. Blanch Media.  Her last colonoscopy here was in February 2019 at which time she was found to have 4 polyps that were removed, diverticulosis, and internal hemorrhoids.  These returned as adenomatous and sessile serrated polyps.  Random colon biopsies were normal.  Repeat colonoscopy is recommended a 3-year interval.  Nonetheless, she presents here today with complaints of left lower quadrant/left pelvic/left groin pain.  She says that this has been present intermittently for the past couple of weeks.  She feels like she has had a similar pain in the past question with a hernia or other issue.  She also wonders if it could be stress related as she has had a lot of stuff going on in her personal life recently.  She says that her bowel habits tend to alternate between constipation and diarrhea, but that is not a new issue.  Denies any rectal bleeding.   Past Medical History:  Diagnosis Date  . Abnormal CT scan 03/16/2017  . Atrial fibrillation ([place])   . Bunion   . Carotid stenosis   . Carpal tunnel syndrome    mild   . Cervical arthritis   . Cholecystitis    s/p cholecystectomy  . Chronic back pain   . Chronic gastritis   . Depression   . Diabetic peripheral neuropathy ([place])   . Diarrhea 04/05/2015  . Digoxin toxicity    08/2011  . Diverticulitis    dx 04/08/14  . Diverticulosis   . Dysphagia    no documented strictures but responded positively to dilation in past.   . Epileptic seizure, tonic ([place])    .No meds since age of 69.  . Family history of adverse reaction to anesthesia    "my mother may have"  . Fatty liver   . Fibromyalgia   . GERD (gastroesophageal reflux disease)   . H/O hiatal hernia    S/P hernia repair  . Hyperlipidemia   . Hypertension   . Hypothyroid   . Internal hemorrhoid   . Joint pain 05/08/2011  . Left knee pain  12/19/2014  . Migraine   . Mild cognitive impairment   . Muscle spasm of back 08/10/2013  . Nonspecific (abnormal) findings on radiological and other examination of gastrointestinal tract 08/07/2011  . OSA (obstructive sleep apnea) dx'd 2003   "can't sleep w/that mask; lost some weight; maybe that's helped" (04/26/2014)  . Osteoarthritis   . Pneumonia X 2  . Right foot pain 08/03/2013  . Sarcoidosis   . Skin neoplasm   . TIA (transient ischemic attack)    "don't know when I had it" (04/26/2014)  . Transaminase or LDH elevation   . Type II diabetes mellitus ([place])   . Ventral hernia s/p laparoscopic lysis of adhesions and hernia repair with mesh 07/29/11 08/01/2011   Past Surgical History:  Procedure Laterality Date  . ABDOMINAL HYSTERECTOMY  1980's   partial  . BILATERAL OOPHORECTOMY  1980's   "after partial hysterectomy"  . BREAST BIOPSY Right    benign  . BREAST EXCISIONAL BIOPSY Right 03/2006   benign  . BREAST SURGERY Right ~ 2010   excision milk duct;  . BUNIONECTOMY Bilateral   . CATARACT EXTRACTION W/PHACO  10/27/2010   Procedure: CATARACT EXTRACTION PHACO AND INTRAOCULAR LENS PLACEMENT (IOC);  Surgeon: Williams Che;  Location: [facility];  Service: Ophthalmology;  Laterality: Left;  CDE- 1.78  . CATARACT EXTRACTION W/PHACO  07/13/2011   Procedure: CATARACT EXTRACTION PHACO AND INTRAOCULAR LENS PLACEMENT (IOC);  Surgeon: Williams Che, MD;  Location: [facility];  Service: Ophthalmology;  Laterality: Right;  CDE:  1.65  . DILATION AND CURETTAGE OF UTERUS  1970; 1976  . ESOPHAGEAL DILATION  multiple  . FOOT SURGERY Left ~ 2011   "straightened toe & scraped bone below big toe"  . FOOT SURGERY Right ~ 2011   "scraped bone of big toe; shortened middle toet"  . HIATAL HERNIA REPAIR  1990   "had to have scar tissue removed 6 months after repair"  . Inverted nipples  1992  . LAPAROSCOPIC CHOLECYSTECTOMY  1980's  . LIPOSUCTION  1992  . SALPINGOOPHORECTOMY Bilateral    "6 months or so  after hysterectomy"  . SKIN CANCER EXCISION  1990's   "front side of right shin"  . TUBAL LIGATION  1976  . VENTRAL HERNIA REPAIR  07/29/2011   Procedure: LAPAROSCOPIC VENTRAL HERNIA;  Surgeon: Adin Hector, MD;  Location: [place];  Service: General;  Laterality: N/A;  multiple incarcerated hernias with mesh    reports that she has never smoked. She has never used smokeless tobacco. She reports that she does not drink alcohol or use drugs. family history includes Anesthesia problems in her mother; Arthritis in her mother; Colitis in her mother; Colon cancer in her paternal uncle; Coronary artery disease in her father; Crohn's disease in her mother; Dementia in her maternal uncle; Depression in her maternal uncle; Diabetes in her father and sister; Heart disease in her father; Melanoma in her father; Migraines in her sister; Pneumonia in her mother; Skin cancer in her father; Stroke in her father. Allergies  Allergen Reactions  . Gemfibrozil Swelling    REACTION: Angioedema  . Latex Other (See Comments)    Blisters where touched or applied  . Penicillins Hives    Will spread in patches all over the body. Did it involve swelling of the face/tongue/throat, SOB, or low BP? No Did it involve sudden or severe rash/hives, skin peeling, or any reaction on the inside of your mouth or nose? No Did you need to seek medical attention at a hospital or doctor's office? Yes When did it last happen? If all above answers are "NO", may proceed with cephalosporin use.   . Adhesive [Tape] Other (See Comments)    Will blister skin where applied - do not use BAND-AIDS.  Marland Kitchen Codeine Hives    Will spread in patches all over the body.  . Digoxin And Related     Makes BP drop  . Metformin And Related Diarrhea  . Omeprazole Diarrhea  . Ace Inhibitors Rash    Cough and rash       Outpatient Encounter Medications as of 07/13/2019  Medication Sig  . acetaminophen (TYLENOL) 500 MG tablet Take 500 mg by  mouth every 6 (six) hours as needed for mild pain or headache.  Marland Kitchen atenolol (TENORMIN) 25 MG tablet Take 1.5 tablets (37.5 mg total) by mouth daily. TAKE 1 AND 1/2 TABLET TWICE A DAY  . baclofen (LIORESAL) 10 MG tablet Take 0.5-1 tablets (5-10 mg total) by mouth 3 (three) times daily as needed for muscle spasms.  . Cholecalciferol (VITAMIN D3 PO) Take 5,000 Int'l Units by mouth daily.  Marland Kitchen diltiazem (CARDIZEM CD) 180 MG 24 hr capsule Take 1 capsule (180 mg total) by mouth daily.  Marland Kitchen docusate sodium (COLACE) 100 MG capsule Take 100 mg  by mouth daily as needed for mild constipation.  . DULoxetine (CYMBALTA) 60 MG capsule Take 1 capsule (60 mg total) by mouth daily.  . famotidine (PEPCID) 20 MG tablet Take 1 tablet (20 mg total) by mouth daily.  Marland Kitchen levothyroxine (SYNTHROID) 50 MCG tablet Take 1 tablet (50 mcg total) by mouth every morning.  Marland Kitchen losartan (COZAAR) 25 MG tablet Take 1 tablet (25 mg total) by mouth daily.  . metFORMIN (GLUCOPHAGE-XR) 500 MG 24 hr tablet 1000mg  (2 tablets) twice daily with food  . Multiple Vitamin (MULTIVITAMIN) tablet Take 1 tablet by mouth daily.  Marland Kitchen Propylene Glycol (SYSTANE BALANCE) 0.6 % SOLN Place 1 drop into both eyes daily as needed (dry eyes).  . rivaroxaban (XARELTO) 20 MG TABS tablet TAKE 1 TABLET DAILY WITH SUPPER (Patient taking differently: Take 20 mg by mouth daily with supper. )  . rosuvastatin (CRESTOR) 20 MG tablet Take 1 tablet (20 mg total) by mouth daily.   No facility-administered encounter medications on file as of 07/13/2019.     REVIEW OF SYSTEMS  : All other systems reviewed and negative except where noted in the History of Present Illness.   PHYSICAL EXAM: BP 110/70   Pulse (!) 50   Temp (!) 96.9 F (36.1 C)   Ht 5\' 3"  (1.6 m)   Wt 166 lb (75.3 kg)   LMP 06/03/1978   BMI 29.41 kg/m  General: Well developed white female in no acute distress Head: Normocephalic and atraumatic Eyes:  Sclerae anicteric, conjunctiva pink. Ears: Normal auditory  acuity  Lungs: Clear throughout to auscultation; no increased WOB. Heart: Regular rate and rhythm; no M/R/G. Abdomen: Soft, non-distended.  BS present.  Non-tender. Musculoskeletal: Symmetrical with no gross deformities  Skin: No lesions on visible extremities Extremities: No edema  Neurological: Alert oriented x 4, grossly non-focal Psychological:  Alert and cooperative. Normal mood and affect  ASSESSMENT AND PLAN: *Left lower quadrant/left pelvic/left groin pain: It definitely is more in her groin.  Question muscular versus arthritis in her hip, but will rule out intra-abdominal/intrapelvic pathology as well.  We will plan for CT scan of the pelvis with contrast.  Will check BMP today.   CC:  Ina Homes, MD

## 2019-07-13 NOTE — Patient Instructions (Addendum)
If you are age 71 or older, your body mass index should be between 23-30. Your Body mass index is 29.41 kg/m. If this is out of the aforementioned range listed, please consider follow up with your Primary Care Provider.  If you are age 24 or younger, your body mass index should be between 19-25. Your Body mass index is 29.41 kg/m. If this is out of the aformentioned range listed, please consider follow up with your Primary Care Provider.   Please go to the lab in the basement of our building to have lab work done as you leave today. Hit "B" for basement when you get on the elevator.  When the doors open the lab is on your left.  We will call you with the results. Thank you.  ___________________________________________________________________  [name] have been scheduled for a CT scan of the pelvis at [facility], 1st floor Radiology.  You are scheduled on Thursday, 07-20-19 at 3:30pm. You should arrive 15 minutes prior to your appointment time for registration.   Please go to [facility] at least 3 days prior to your procedure to pick up instructions and contrast for the exam.  WARNING: IF YOU ARE ALLERGIC TO IODINE/X-RAY DYE, PLEASE NOTIFY RADIOLOGY IMMEDIATELY AT [phone]! YOU WILL BE GIVEN A 13 HOUR PREMEDICATION PREP.   If you have any questions regarding your exam or if you need to reschedule, you may call [phone] between the hours of 8:00 am and 5:00 pm, Monday-Friday.  ____________________________________________________________________  Thank you for entrusting me with your care and for choosing [employer] - C.   Marland Kitchen

## 2019-07-13 NOTE — Progress Notes (Signed)
Assessment and plan reviewed 

## 2019-07-18 ENCOUNTER — Ambulatory Visit: Payer: PPO | Admitting: Licensed Clinical Social Worker

## 2019-07-20 ENCOUNTER — Encounter (HOSPITAL_COMMUNITY): Payer: Self-pay

## 2019-07-20 ENCOUNTER — Other Ambulatory Visit: Payer: Self-pay | Admitting: Internal Medicine

## 2019-07-20 ENCOUNTER — Other Ambulatory Visit: Payer: Self-pay

## 2019-07-20 ENCOUNTER — Ambulatory Visit (HOSPITAL_COMMUNITY)
Admission: RE | Admit: 2019-07-20 | Discharge: 2019-07-20 | Disposition: A | Discharge status: Home / Self Care | Payer: PPO | Source: Ambulatory Visit | Attending: Gastroenterology | Admitting: Gastroenterology

## 2019-07-20 DIAGNOSIS — K4020 Bilateral inguinal hernia, without obstruction or gangrene, not specified as recurrent: Secondary | ICD-10-CM | POA: Diagnosis not present

## 2019-07-20 DIAGNOSIS — R1032 Left lower quadrant pain: Secondary | ICD-10-CM | POA: Insufficient documentation

## 2019-07-20 DIAGNOSIS — R102 Pelvic and perineal pain: Secondary | ICD-10-CM | POA: Insufficient documentation

## 2019-07-20 DIAGNOSIS — Z1231 Encounter for screening mammogram for malignant neoplasm of breast: Secondary | ICD-10-CM

## 2019-07-20 MED ORDER — SODIUM CHLORIDE (PF) 0.9 % IJ SOLN
INTRAMUSCULAR | Status: AC
  Filled 2019-07-20: qty 50

## 2019-07-20 MED ORDER — IOHEXOL 300 MG/ML  SOLN
100.0000 mL | Freq: Once | INTRAMUSCULAR | Status: AC | PRN
  Administered 2019-07-20: 100 mL via INTRAVENOUS

## 2019-07-24 ENCOUNTER — Ambulatory Visit: Payer: PPO | Admitting: Cardiology

## 2019-07-27 DIAGNOSIS — Z7189 Other specified counseling: Secondary | ICD-10-CM | POA: Insufficient documentation

## 2019-07-27 NOTE — Progress Notes (Signed)
Cardiology Office Note   Date:  07/28/2019   ID:  [name], [name] January 14, 1951, MRN [id number]  PCP:  Ina Homes, MD  Cardiologist:   Minus Breeding, MD   Chief Complaint  Patient presents with  . Chest Pain      History of Present Illness: [name] is a 69 y.o. female who presents for follow-up of atrial fibrillation. She has permanent atrial fibrillation.  Since I last saw her she has had a lot of stress.  Unfortunately her daughter died of a spindle cell sarcoma of the heart.  Even in addition to this she has had increased family stress.  She is newly married.  She is having what she describes as panic attacks.  These come out of the blue.  Symptoms will last for couple of hours.  She breaths erratically and her heart starts beating fast.  She gets some substernal discomfort.  She does not bring this on with activities.  She does lifting and lots of the work around the house.  With this she denies bringing on the symptoms which usually happening at rest.  She did have Covid in August.  She is not any new resting shortness of breath, PND or orthopnea.   Past Medical History:  Diagnosis Date  . Abnormal CT scan 03/16/2017  . Atrial fibrillation ([place])   . Bunion   . Carotid stenosis   . Carpal tunnel syndrome    mild   . Cervical arthritis   . Cholecystitis    s/p cholecystectomy  . Chronic back pain   . Chronic gastritis   . Depression   . Diabetic peripheral neuropathy ([place])   . Diarrhea 04/05/2015  . Digoxin toxicity    08/2011  . Diverticulitis    dx 04/08/14  . Diverticulosis   . Dysphagia    no documented strictures but responded positively to dilation in past.   . Epileptic seizure, tonic ([place])    .No meds since age of 80.  . Family history of adverse reaction to anesthesia    "my mother may have"  . Fatty liver   . Fibromyalgia   . GERD (gastroesophageal reflux disease)   . H/O hiatal hernia    S/P hernia repair  . Hyperlipidemia     . Hypertension   . Hypothyroid   . Internal hemorrhoid   . Joint pain 05/08/2011  . Left knee pain 12/19/2014  . Migraine   . Mild cognitive impairment   . Muscle spasm of back 08/10/2013  . Nonspecific (abnormal) findings on radiological and other examination of gastrointestinal tract 08/07/2011  . OSA (obstructive sleep apnea) dx'd 2003   "can't sleep w/that mask; lost some weight; maybe that's helped" (04/26/2014)  . Osteoarthritis   . Pneumonia X 2  . Right foot pain 08/03/2013  . Sarcoidosis   . Skin neoplasm   . TIA (transient ischemic attack)    "don't know when I had it" (04/26/2014)  . Transaminase or LDH elevation   . Type II diabetes mellitus ([place])   . Ventral hernia s/p laparoscopic lysis of adhesions and hernia repair with mesh 07/29/11 08/01/2011    Past Surgical History:  Procedure Laterality Date  . ABDOMINAL HYSTERECTOMY  1980's   partial  . BILATERAL OOPHORECTOMY  1980's   "after partial hysterectomy"  . BREAST BIOPSY Right    benign  . BREAST EXCISIONAL BIOPSY Right 03/2006   benign  . BREAST SURGERY Right ~ 2010   excision milk  duct;  . BUNIONECTOMY Bilateral   . CATARACT EXTRACTION W/PHACO  10/27/2010   Procedure: CATARACT EXTRACTION PHACO AND INTRAOCULAR LENS PLACEMENT (IOC);  Surgeon: Williams Che;  Location: [facility];  Service: Ophthalmology;  Laterality: Left;  CDE- 1.78  . CATARACT EXTRACTION W/PHACO  07/13/2011   Procedure: CATARACT EXTRACTION PHACO AND INTRAOCULAR LENS PLACEMENT (IOC);  Surgeon: Williams Che, MD;  Location: [facility];  Service: Ophthalmology;  Laterality: Right;  CDE:  1.65  . DILATION AND CURETTAGE OF UTERUS  1970; 1976  . ESOPHAGEAL DILATION  multiple  . FOOT SURGERY Left ~ 2011   "straightened toe & scraped bone below big toe"  . FOOT SURGERY Right ~ 2011   "scraped bone of big toe; shortened middle toet"  . HIATAL HERNIA REPAIR  1990   "had to have scar tissue removed 6 months after repair"  . Inverted nipples  1992  .  LAPAROSCOPIC CHOLECYSTECTOMY  1980's  . LIPOSUCTION  1992  . SALPINGOOPHORECTOMY Bilateral    "6 months or so after hysterectomy"  . SKIN CANCER EXCISION  1990's   "front side of right shin"  . TUBAL LIGATION  1976  . VENTRAL HERNIA REPAIR  07/29/2011   Procedure: LAPAROSCOPIC VENTRAL HERNIA;  Surgeon: Adin Hector, MD;  Location: [facility];  Service: General;  Laterality: N/A;  multiple incarcerated hernias with mesh     Current Outpatient Medications  Medication Sig Dispense Refill  . acetaminophen (TYLENOL) 500 MG tablet Take 500 mg by mouth every 6 (six) hours as needed for mild pain or headache.    Marland Kitchen atenolol (TENORMIN) 25 MG tablet Take 1.5 tablets (37.5 mg total) by mouth daily. TAKE 1 AND 1/2 TABLET TWICE A DAY 135 tablet 3  . baclofen (LIORESAL) 10 MG tablet Take 0.5-1 tablets (5-10 mg total) by mouth 3 (three) times daily as needed for muscle spasms. 90 each 3  . Cholecalciferol (VITAMIN D3 PO) Take 5,000 Int'l Units by mouth daily.    Marland Kitchen diltiazem (CARDIZEM CD) 180 MG 24 hr capsule Take 1 capsule (180 mg total) by mouth daily. 90 capsule 3  . docusate sodium (COLACE) 100 MG capsule Take 100 mg by mouth daily as needed for mild constipation.    . DULoxetine (CYMBALTA) 60 MG capsule Take 1 capsule (60 mg total) by mouth daily. 90 capsule 3  . famotidine (PEPCID) 20 MG tablet Take 1 tablet (20 mg total) by mouth daily. 90 tablet 3  . levothyroxine (SYNTHROID) 50 MCG tablet Take 1 tablet (50 mcg total) by mouth every morning. 90 tablet 3  . losartan (COZAAR) 25 MG tablet Take 1 tablet (25 mg total) by mouth daily. 90 tablet 3  . metFORMIN (GLUCOPHAGE-XR) 500 MG 24 hr tablet 1000mg  (2 tablets) twice daily with food 360 tablet 0  . Multiple Vitamin (MULTIVITAMIN) tablet Take 1 tablet by mouth daily.    Marland Kitchen Propylene Glycol (SYSTANE BALANCE) 0.6 % SOLN Place 1 drop into both eyes daily as needed (dry eyes).    . rivaroxaban (XARELTO) 20 MG TABS tablet TAKE 1 TABLET DAILY WITH SUPPER  (Patient taking differently: Take 20 mg by mouth daily with supper. ) 90 tablet 1  . rosuvastatin (CRESTOR) 20 MG tablet Take 1 tablet (20 mg total) by mouth daily. 90 tablet 3   No current facility-administered medications for this visit.    Allergies:   Gemfibrozil, Latex, Penicillins, Adhesive [tape], Codeine, Digoxin and related, Metformin and related, Omeprazole, and Ace inhibitors    ROS:  Please see the history of present illness.   Otherwise, review of systems are positive for none.   All other systems are reviewed and negative.    PHYSICAL EXAM: VS:  BP 120/70   Pulse 76   Ht 5\' 3"  (1.6 m)   Wt 166 lb (75.3 kg)   LMP 06/03/1978   SpO2 100%   BMI 29.41 kg/m  , BMI Body mass index is 29.41 kg/m. GENERAL:  Well appearing NECK:  No jugular venous distention, waveform within normal limits, carotid upstroke brisk and symmetric, no bruits, no thyromegaly LUNGS:  Clear to auscultation bilaterally CHEST:  Unremarkable HEART:  PMI not displaced or sustained,S1 and S2 within normal limits, no S3,  no clicks, no rubs, no murmurs, irregular ABD:  Flat, positive bowel sounds normal in frequency in pitch, no bruits, no rebound, no guarding, no midline pulsatile mass, no hepatomegaly, no splenomegaly EXT:  2 plus pulses throughout, no edema, no cyanosis no clubbing   EKG:  EKG is ordered today. The ekg ordered today demonstrates atrial fibrillation, rate 72, interventricular conduction delay, no acute ST-T wave changes.   Recent Labs: 10/27/2018: TSH 0.745 03/22/2019: ALT 25; Hemoglobin 15.0; Platelets 212 07/13/2019: BUN 9; Creatinine, Ser 0.81; Potassium 4.4; Sodium 138    Lipid Panel    Component Value Date/Time   CHOL 109 01/13/2018 1431   TRIG 150 (H) 01/13/2018 1431   HDL 52 01/13/2018 1431   CHOLHDL 2.1 01/13/2018 1431   CHOLHDL 2.9 04/19/2014 1603   VLDL 36 04/19/2014 1603   LDLCALC 27 01/13/2018 1431      Wt Readings from Last 3 Encounters:  07/28/19 166 lb (75.3  kg)  07/13/19 166 lb (75.3 kg)  05/04/19 172 lb 4.8 oz (78.2 kg)      Other studies Reviewed: Additional studies/ records that were reviewed today include: None. Review of the above records demonstrates:  Please see elsewhere in the note.     ASSESSMENT AND PLAN:  ATRIAL FIB:Ms.Helina E Joycehas a CHA2DS2 - VASc score of 4.   Her rate seems to be well controlled.  She tolerates anticoagulation.  No change in therapy.  CHEST PAIN:  Her chest pain is somewhat atypical but increased in severity and frequency.  She needs stress testing and because of her conduction disturbance will need a [employer].  COVID EDUCATION: She had Covid.  We did talk about the vaccine.   Current medicines are reviewed at length with the patient today.  The patient does not have concerns regarding medicines.  The following changes have been made:  no change  Labs/ tests ordered today include:   Orders Placed This Encounter  Procedures  . CBC  . MYOCARDIAL PERFUSION IMAGING  . EKG 12-Lead     Disposition:   FU with me in one year.     Signed, Minus Breeding, MD  07/28/2019 5:44 PM    [facility]

## 2019-07-28 ENCOUNTER — Ambulatory Visit: Payer: PPO | Admitting: Cardiology

## 2019-07-28 ENCOUNTER — Other Ambulatory Visit: Payer: Self-pay

## 2019-07-28 ENCOUNTER — Encounter: Payer: Self-pay | Admitting: Cardiology

## 2019-07-28 VITALS — BP 120/70 | HR 76 | Ht 63.0 in | Wt 166.0 lb

## 2019-07-28 DIAGNOSIS — I482 Chronic atrial fibrillation, unspecified: Secondary | ICD-10-CM

## 2019-07-28 DIAGNOSIS — Z7189 Other specified counseling: Secondary | ICD-10-CM | POA: Diagnosis not present

## 2019-07-28 DIAGNOSIS — R072 Precordial pain: Secondary | ICD-10-CM | POA: Diagnosis not present

## 2019-07-28 NOTE — Patient Instructions (Signed)
Medication Instructions:  NO CHANGES *If you need a refill on your cardiac medications before your next appointment, please call your pharmacy*  Lab Work: Your physician recommends that you return for lab work (CBC)  Testing/Procedures: Your physician has requested that you have a lexiscan myoview. For further information please visit [email or web address]. Please follow instruction sheet, as given.  Follow-Up: At [facility], you and your health needs are our priority.  As part of our continuing mission to provide you with exceptional heart care, we have created designated Provider Care Teams.  These Care Teams include your primary Cardiologist (physician) and Advanced Practice Providers (APPs -  Physician Assistants and Nurse Practitioners) who all work together to provide you with the care you need, when you need it.  Your next appointment:   12 month(s)  The format for your next appointment:   In Person  Provider:   Minus Breeding, MD    Your physician has requested that you have a lexiscan myoview. For further information please visit [email or web address]. Please follow instruction sheet, as given. This will take place at [place], [address]  How to prepare for your Myocardial Perfusion Test:  Do not eat or drink 3 hours prior to your test, except you may have water.  Do not consume products containing caffeine (regular or decaffeinated) 12 hours prior to your test. (ex: coffee, chocolate, sodas, tea).  Do bring a list of your current medications with you.  If not listed below, you may take your medications as normal.  Do wear comfortable clothes (no dresses or overalls) and walking shoes, tennis shoes preferred (No heels or open toe shoes are allowed).  Do NOT wear cologne, perfume, aftershave, or lotions (deodorant is allowed).  The test will take approximately 3 to 4 hours to complete  If these instructions are not followed, your test will have to be  rescheduled.

## 2019-08-01 DIAGNOSIS — I482 Chronic atrial fibrillation, unspecified: Secondary | ICD-10-CM | POA: Diagnosis not present

## 2019-08-01 DIAGNOSIS — R072 Precordial pain: Secondary | ICD-10-CM | POA: Diagnosis not present

## 2019-08-01 LAB — CBC
Hematocrit: 39.6 % (ref 34.0–46.6)
Hemoglobin: 13.7 g/dL (ref 11.1–15.9)
MCH: 32.5 pg (ref 26.6–33.0)
MCHC: 34.6 g/dL (ref 31.5–35.7)
MCV: 94 fL (ref 79–97)
Platelets: 210 10*3/uL (ref 150–450)
RBC: 4.22 x10E6/uL (ref 3.77–5.28)
RDW: 12.3 % (ref 11.7–15.4)
WBC: 6.8 10*3/uL (ref 3.4–10.8)

## 2019-08-03 ENCOUNTER — Encounter: Payer: PPO | Admitting: Internal Medicine

## 2019-08-08 ENCOUNTER — Encounter: Payer: Self-pay | Admitting: Cardiology

## 2019-08-10 ENCOUNTER — Telehealth (HOSPITAL_COMMUNITY): Payer: Self-pay

## 2019-08-10 NOTE — Telephone Encounter (Signed)
Encounter complete. 

## 2019-08-11 NOTE — Progress Notes (Signed)
Result letter mailed to patient

## 2019-08-16 ENCOUNTER — Ambulatory Visit (HOSPITAL_COMMUNITY)
Admission: RE | Admit: 2019-08-16 | Discharge: 2019-08-16 | Disposition: A | Discharge status: Home / Self Care | Payer: PPO | Source: Ambulatory Visit | Attending: Internal Medicine | Admitting: Internal Medicine

## 2019-08-16 ENCOUNTER — Other Ambulatory Visit: Payer: Self-pay

## 2019-08-16 DIAGNOSIS — R072 Precordial pain: Secondary | ICD-10-CM | POA: Diagnosis not present

## 2019-08-16 LAB — MYOCARDIAL PERFUSION IMAGING
LV dias vol: 86 mL (ref 46–106)
LV sys vol: 34 mL
Peak HR: 82 {beats}/min
Rest HR: 72 {beats}/min
SDS: 1
SRS: 3
SSS: 4
TID: 0.98

## 2019-08-16 MED ORDER — TECHNETIUM TC 99M TETROFOSMIN IV KIT
30.8000 | PACK | Freq: Once | INTRAVENOUS | Status: AC | PRN
  Administered 2019-08-16: 30.8 via INTRAVENOUS
  Filled 2019-08-16: qty 31

## 2019-08-16 MED ORDER — REGADENOSON 0.4 MG/5ML IV SOLN
0.4000 mg | Freq: Once | INTRAVENOUS | Status: AC
Start: 2019-08-16 — End: 2019-08-16
  Administered 2019-08-16: 0.4 mg via INTRAVENOUS

## 2019-08-16 MED ORDER — TECHNETIUM TC 99M TETROFOSMIN IV KIT
10.6000 | PACK | Freq: Once | INTRAVENOUS | Status: AC | PRN
  Administered 2019-08-16: 10.6 via INTRAVENOUS
  Filled 2019-08-16: qty 11

## 2019-08-23 ENCOUNTER — Ambulatory Visit (INDEPENDENT_AMBULATORY_CARE_PROVIDER_SITE_OTHER): Payer: PPO | Admitting: Gastroenterology

## 2019-08-23 ENCOUNTER — Encounter: Payer: Self-pay | Admitting: Gastroenterology

## 2019-08-23 VITALS — BP 126/70 | HR 78 | Wt 166.0 lb

## 2019-08-23 DIAGNOSIS — K5900 Constipation, unspecified: Secondary | ICD-10-CM | POA: Diagnosis not present

## 2019-08-23 DIAGNOSIS — R109 Unspecified abdominal pain: Secondary | ICD-10-CM

## 2019-08-23 NOTE — Progress Notes (Signed)
08/23/2019 [name] [id number] [birth year]   HISTORY OF PRESENT ILLNESS: This is a pleasant 69 year old female who is a patient of Dr. Blanch Media.  Her last colonoscopy was in February 2019 at which time she was found to have 4 polyps that were removed, diverticulosis, and internal hemorrhoids.  These returned as adenomatous and sessile serrated polyps.  Random colon biopsies were normal.  Repeat colonoscopy was recommended at a 3-year interval.  She has a history of IBS with alternating constipation and diarrhea.  She was just seen by me on Jul 13, 2019 with complaints of left lower quadrant/left pelvic/left groin pain.  CT scan of the pelvis was performed and showed only bilateral fat-containing inguinal hernias and small fat-containing left lower quadrant ventral abdominal wall hernia.  At this point she has decided to not see a surgeon in regards to these hernias.  She returns here today with a new right sided/flank pain that began just last week.  She says that this felt like there was a knot in that area.  The know seems to be gone now, but it is just sore.  Says that she was taking Tylenol.  Also reports constipation this week.  Has been taking some Colace stool softeners without much improvement.  Once again, we spent a lot of time talking about her personal life and family issues.  I think she has a lot of anxiety, worry, and some depression driving some of her need for reassurance.  Her daughter died suddenly in 12/24/2018 leaving her husband and 2 young children behind in [place].  Her husband has a lot of health issues.   Past Medical History:  Diagnosis Date  . Abnormal CT scan 03/16/2017  . Atrial fibrillation ([place])   . Bunion   . Carotid stenosis   . Carpal tunnel syndrome    mild   . Cervical arthritis   . Cholecystitis    s/p cholecystectomy  . Chronic back pain   . Chronic gastritis   . Depression   . Diabetic peripheral neuropathy ([place])   . Diarrhea  04/05/2015  . Digoxin toxicity    08/2011  . Diverticulitis    dx 04/08/14  . Diverticulosis   . Dysphagia    no documented strictures but responded positively to dilation in past.   . Epileptic seizure, tonic ([place])    .No meds since age of 89.  . Family history of adverse reaction to anesthesia    "my mother may have"  . Fatty liver   . Fibromyalgia   . GERD (gastroesophageal reflux disease)   . H/O hiatal hernia    S/P hernia repair  . Hyperlipidemia   . Hypertension   . Hypothyroid   . Internal hemorrhoid   . Joint pain 05/08/2011  . Left knee pain 12/19/2014  . Migraine   . Mild cognitive impairment   . Muscle spasm of back 08/10/2013  . Nonspecific (abnormal) findings on radiological and other examination of gastrointestinal tract 08/07/2011  . OSA (obstructive sleep apnea) dx'd 2003   "can't sleep w/that mask; lost some weight; maybe that's helped" (04/26/2014)  . Osteoarthritis   . Pneumonia X 2  . Right foot pain 08/03/2013  . Sarcoidosis   . Seizures ([place])    as a child, they stopped after age 61  . Skin neoplasm   . TIA (transient ischemic attack)    "don't know when I had it" (04/26/2014)  . Transaminase or LDH elevation   .  Type II diabetes mellitus ([place])   . Ventral hernia s/p laparoscopic lysis of adhesions and hernia repair with mesh 07/29/11 08/01/2011   Past Surgical History:  Procedure Laterality Date  . ABDOMINAL HYSTERECTOMY  1980's   partial  . BILATERAL OOPHORECTOMY  1980's   "after partial hysterectomy"  . BREAST BIOPSY Right    benign  . BREAST EXCISIONAL BIOPSY Right 03/2006   benign  . BREAST SURGERY Right ~ 2010   excision milk duct;  . BUNIONECTOMY Bilateral   . CATARACT EXTRACTION W/PHACO  10/27/2010   Procedure: CATARACT EXTRACTION PHACO AND INTRAOCULAR LENS PLACEMENT (IOC);  Surgeon: Williams Che;  Location: [facility];  Service: Ophthalmology;  Laterality: Left;  CDE- 1.78  . CATARACT EXTRACTION W/PHACO  07/13/2011   Procedure: CATARACT  EXTRACTION PHACO AND INTRAOCULAR LENS PLACEMENT (IOC);  Surgeon: Williams Che, MD;  Location: [facility];  Service: Ophthalmology;  Laterality: Right;  CDE:  1.65  . DILATION AND CURETTAGE OF UTERUS  1970; 1976  . ESOPHAGEAL DILATION  multiple  . FOOT SURGERY Left ~ 2011   "straightened toe & scraped bone below big toe"  . FOOT SURGERY Right ~ 2011   "scraped bone of big toe; shortened middle toet"  . HIATAL HERNIA REPAIR  1990   "had to have scar tissue removed 6 months after repair"  . Inverted nipples  1992  . LAPAROSCOPIC CHOLECYSTECTOMY  1980's  . LIPOSUCTION  1992  . SALPINGOOPHORECTOMY Bilateral    "6 months or so after hysterectomy"  . SKIN CANCER EXCISION  1990's   "front side of right shin"  . TUBAL LIGATION  1976  . VENTRAL HERNIA REPAIR  07/29/2011   Procedure: LAPAROSCOPIC VENTRAL HERNIA;  Surgeon: Adin Hector, MD;  Location: [facility];  Service: General;  Laterality: N/A;  multiple incarcerated hernias with mesh    reports that she has never smoked. She has never used smokeless tobacco. She reports that she does not drink alcohol and does not use drugs. family history includes Anesthesia problems in her mother; Arthritis in her mother; Colitis in her mother; Colon cancer in her paternal uncle; Coronary artery disease in her father; Crohn's disease in her mother; Dementia in her maternal uncle; Depression in her maternal uncle; Diabetes in her father and sister; Heart disease in her father; Melanoma in her father; Migraines in her sister; Other (age of onset: 84) in her daughter; Pneumonia (age of onset: 9) in her mother; Skin cancer in her father; Stroke in her father. Allergies  Allergen Reactions  . Gemfibrozil Swelling    REACTION: Angioedema  . Latex Other (See Comments)    Blisters where touched or applied  . Penicillins Hives    Will spread in patches all over the body. Did it involve swelling of the face/tongue/throat, SOB, or low BP? No Did it involve sudden or  severe rash/hives, skin peeling, or any reaction on the inside of your mouth or nose? No Did you need to seek medical attention at a hospital or doctor's office? Yes When did it last happen? If all above answers are "NO", may proceed with cephalosporin use.   . Adhesive [Tape] Other (See Comments)    Will blister skin where applied - do not use BAND-AIDS.  Marland Kitchen Codeine Hives    Will spread in patches all over the body.  . Digoxin And Related     Makes BP drop  . Metformin And Related Diarrhea  . Omeprazole Diarrhea  . Ace Inhibitors Rash  Cough and rash       Outpatient Encounter Medications as of 08/23/2019  Medication Sig  . acetaminophen (TYLENOL) 500 MG tablet Take 500 mg by mouth every 6 (six) hours as needed for mild pain or headache.  Marland Kitchen atenolol (TENORMIN) 25 MG tablet Take 1.5 tablets (37.5 mg total) by mouth daily. TAKE 1 AND 1/2 TABLET TWICE A DAY  . Cholecalciferol (VITAMIN D3 PO) Take 5,000 Int'l Units by mouth daily.  Marland Kitchen diltiazem (CARDIZEM CD) 180 MG 24 hr capsule Take 1 capsule (180 mg total) by mouth daily.  Marland Kitchen docusate sodium (COLACE) 100 MG capsule Take 100 mg by mouth daily as needed for mild constipation.  . DULoxetine (CYMBALTA) 60 MG capsule Take 1 capsule (60 mg total) by mouth daily.  . famotidine (PEPCID) 20 MG tablet Take 1 tablet (20 mg total) by mouth daily.  Marland Kitchen levothyroxine (SYNTHROID) 50 MCG tablet Take 1 tablet (50 mcg total) by mouth every morning.  Marland Kitchen losartan (COZAAR) 25 MG tablet Take 1 tablet (25 mg total) by mouth daily.  . metFORMIN (GLUCOPHAGE-XR) 500 MG 24 hr tablet 1000mg  (2 tablets) twice daily with food  . Multiple Vitamin (MULTIVITAMIN) tablet Take 1 tablet by mouth daily.  Marland Kitchen Propylene Glycol (SYSTANE BALANCE) 0.6 % SOLN Place 1 drop into both eyes daily as needed (dry eyes).  . rivaroxaban (XARELTO) 20 MG TABS tablet TAKE 1 TABLET DAILY WITH SUPPER (Patient taking differently: Take 20 mg by mouth daily with supper. )  . rosuvastatin  (CRESTOR) 20 MG tablet Take 1 tablet (20 mg total) by mouth daily.  . vitamin B-12 (CYANOCOBALAMIN) 500 MCG tablet Take 500 mcg by mouth daily.  . [DISCONTINUED] baclofen (LIORESAL) 10 MG tablet Take 0.5-1 tablets (5-10 mg total) by mouth 3 (three) times daily as needed for muscle spasms.   No facility-administered encounter medications on file as of 08/23/2019.     REVIEW OF SYSTEMS  : All other systems reviewed and negative except where noted in the History of Present Illness.   PHYSICAL EXAM: BP 126/70   Pulse 78   Wt 166 lb (75.3 kg)   LMP 06/03/1978   BMI 29.41 kg/m  General: Well developed white female in no acute distress Head: Normocephalic and atraumatic Eyes:  Sclerae anicteric, conjunctiva pink. Ears: Normal auditory acuity Lungs: Clear throughout to auscultation; no increased WOB. Heart: Regular rate and rhythm; no M/R/G. Abdomen: Soft, non-distended.  BS present.  Non-tender.  No abnormalities or tenderness noted at the area of concern. Musculoskeletal: Symmetrical with no gross deformities  Skin: No lesions on visible extremities Extremities: No edema  Neurological: Alert oriented x 4, grossly non-focal Psychological:  Alert and cooperative. Normal mood and affect  ASSESSMENT AND PLAN: *Right flank pain: Describes feeling a "knot" in that area.  Nothing felt on exam today.  Location is over left side/posterior rib cage area.  Likely was muscular in origin.  Advised to use Tylenol, heating pad, and monitor for now. *IBS with alternating constipation and diarrhea: Currently having some constipation with no relief from Colace stool softeners.  Advised to try MiraLAX 1 capful mixed in 8 ounces of liquid daily.  **She will call back with any other issues and follow-up as needed.   CC:  Ina Homes, MD

## 2019-08-23 NOTE — Patient Instructions (Signed)
If you are age 71 or older, your body mass index should be between 23-30. Your Body mass index is 29.41 kg/m. If this is out of the aforementioned range listed, please consider follow up with your Primary Care Provider.  If you are age 63 or younger, your body mass index should be between 19-25. Your Body mass index is 29.41 kg/m. If this is out of the aformentioned range listed, please consider follow up with your Primary Care Provider.   May use heating pad on right side as needed.  Start Miralax 1 capful daily in 8 ounces liquid.   Follow up as needed.

## 2019-08-23 NOTE — Progress Notes (Signed)
Assessment noted 

## 2019-08-31 ENCOUNTER — Encounter: Payer: PPO | Admitting: Internal Medicine

## 2019-08-31 NOTE — Progress Notes (Deleted)
   CC: ***  HPI:  Ms.Felipa Dharma Pare is a 69 y.o. female with PMHx listed below presenting for ***. Please see the A&P for the status of the patient's chronic medical problems.  Past Medical History:  Diagnosis Date  . Abnormal CT scan 03/16/2017  . Atrial fibrillation ([place])   . Bunion   . Carotid stenosis   . Carpal tunnel syndrome    mild   . Cervical arthritis   . Cholecystitis    s/p cholecystectomy  . Chronic back pain   . Chronic gastritis   . Depression   . Diabetic peripheral neuropathy ([place])   . Diarrhea 04/05/2015  . Digoxin toxicity    08/2011  . Diverticulitis    dx 04/08/14  . Diverticulosis   . Dysphagia    no documented strictures but responded positively to dilation in past.   . Epileptic seizure, tonic ([place])    .No meds since age of 84.  . Family history of adverse reaction to anesthesia    "my mother may have"  . Fatty liver   . Fibromyalgia   . GERD (gastroesophageal reflux disease)   . H/O hiatal hernia    S/P hernia repair  . Hyperlipidemia   . Hypertension   . Hypothyroid   . Internal hemorrhoid   . Joint pain 05/08/2011  . Left knee pain 12/19/2014  . Migraine   . Mild cognitive impairment   . Muscle spasm of back 08/10/2013  . Nonspecific (abnormal) findings on radiological and other examination of gastrointestinal tract 08/07/2011  . OSA (obstructive sleep apnea) dx'd 2003   "can't sleep w/that mask; lost some weight; maybe that's helped" (04/26/2014)  . Osteoarthritis   . Pneumonia X 2  . Right foot pain 08/03/2013  . Sarcoidosis   . Seizures ([place])    as a child, they stopped after age 44  . Skin neoplasm   . TIA (transient ischemic attack)    "don't know when I had it" (04/26/2014)  . Transaminase or LDH elevation   . Type II diabetes mellitus ([place])   . Ventral hernia s/p laparoscopic lysis of adhesions and hernia repair with mesh 07/29/11 08/01/2011   Review of Systems:  12 point ROS preformed. All negative aside from those mentioned  in the HPI.  Physical Exam: There were no vitals filed for this visit. General: Well nourished feamle in no acute distress Pulm: Good air movement with no wheezing or crackles  CV: RRR, no murmurs, no rubs   Assessment & Plan:   See Encounters Tab for problem based charting.  Patient {GC/GE:[id number]::"discussed with","seen with"} Dr. {NAMES:[id number]::"Butcher","Guilloud","Hoffman","Mullen","Narendra","Raines","Vincent"}

## 2019-09-13 ENCOUNTER — Telehealth: Payer: Self-pay | Admitting: Student

## 2019-09-13 NOTE — Telephone Encounter (Signed)
Pls contact pt regarding lyrica; jen kim was previously helping patient; [phone]

## 2019-09-14 NOTE — Telephone Encounter (Signed)
Pt calls and states someone from clinic called her and she didn't get to ph in time. It was not kelly's call, she spoke w/ her. I do not see any notes that anyone has called her. She then began to talk about her medicine, states she has about 1 1/2 weeks left of meds. Reminded her to complete paperwork asap when it arrives and return it. Informed her imc has samples of xarelto and kelly and [facility] will work to make sure she has meds. Looked up prices of atenolol and metformin on goodrx and she states she does not know if she can afford appr 25.00 for the meds- both together are appr 25.00. ask her to not worry at present and again kelly and the imc will work to help w/ meds. Sending to kelly godwin and yellow team

## 2019-09-14 NOTE — Telephone Encounter (Signed)
Pt is requesting refills on her Atenolol, Metformin and Xarelto sent to her pharmacy.  If samples are available for Xarelto she is requesting samples due to being in her 'donut hole' and her copay is $500.  I have mailed patient the [employer] [employer] patient assistance application for for Xarelto for her to complete the patient portion and return to office for future fills at no charge.  She is not taking Lyrica as stated in previous encounter.

## 2019-09-18 ENCOUNTER — Other Ambulatory Visit: Payer: Self-pay | Admitting: Internal Medicine

## 2019-09-18 DIAGNOSIS — I10 Essential (primary) hypertension: Secondary | ICD-10-CM

## 2019-09-18 DIAGNOSIS — I482 Chronic atrial fibrillation, unspecified: Secondary | ICD-10-CM

## 2019-09-19 ENCOUNTER — Other Ambulatory Visit: Payer: Self-pay | Admitting: *Deleted

## 2019-09-19 DIAGNOSIS — E119 Type 2 diabetes mellitus without complications: Secondary | ICD-10-CM

## 2019-09-19 MED ORDER — METFORMIN HCL ER 500 MG PO TB24: ORAL_TABLET | ORAL | 1 refills | Status: DC

## 2019-09-25 ENCOUNTER — Telehealth: Payer: Self-pay | Admitting: *Deleted

## 2019-09-25 NOTE — Telephone Encounter (Signed)
Patient handed 2 bottles Xarelto as below by Dr. Truman Hayward. Hubbard Hartshorn, BSN, RN-BC

## 2019-09-25 NOTE — Telephone Encounter (Signed)
-----   Message from Rickey Barbara, [place] sent at 09/18/2019  3:26 PM EDT ----- Regarding: RE: Xarelto paperwork Thank you! ----- Message ----- From: Velora Heckler, RN Sent: 09/18/2019  11:53 AM EDT To: Rickey Barbara, CPhT Subject: Xarelto paperwork                              Hi Claiborne Billings,  This patient just dropped off the paperwork you gave her. And I made a copy of her [employer] surveyor. I will leave them in your box in the [employer].   Thanks for all your help!  Ander Purpura

## 2019-09-25 NOTE — Telephone Encounter (Addendum)
Patient called in stating last tab of Xarelto 20 mg will be Sat 09/30/2019. Requesting samples. Patient will come by this afternoon to pick up 2 bottles.  Drug name: Xarelto       Strength: 20 mg        Qty: 7 tabs  LOT: [id number]  Exp.Date:12/06/2020  Drug name: Xarelto       Strength: 20 mg        Qty: 7 tabs  LOT: [id number]           Exp.Date:03/08/2021  L. Else Habermann, BSN, RN-BC

## 2019-09-27 DIAGNOSIS — F331 Major depressive disorder, recurrent, moderate: Secondary | ICD-10-CM | POA: Diagnosis not present

## 2019-10-09 ENCOUNTER — Other Ambulatory Visit: Payer: Self-pay

## 2019-10-09 ENCOUNTER — Encounter: Payer: Self-pay | Admitting: Student

## 2019-10-09 ENCOUNTER — Telehealth: Payer: Self-pay | Admitting: Dietician

## 2019-10-09 ENCOUNTER — Ambulatory Visit (INDEPENDENT_AMBULATORY_CARE_PROVIDER_SITE_OTHER): Payer: PPO | Admitting: Student

## 2019-10-09 VITALS — BP 122/80 | HR 76 | Temp 98.5°F | Ht 63.0 in | Wt 162.4 lb

## 2019-10-09 DIAGNOSIS — E1121 Type 2 diabetes mellitus with diabetic nephropathy: Secondary | ICD-10-CM

## 2019-10-09 DIAGNOSIS — I4891 Unspecified atrial fibrillation: Secondary | ICD-10-CM | POA: Diagnosis not present

## 2019-10-09 DIAGNOSIS — I10 Essential (primary) hypertension: Secondary | ICD-10-CM

## 2019-10-09 DIAGNOSIS — G8929 Other chronic pain: Secondary | ICD-10-CM | POA: Diagnosis not present

## 2019-10-09 DIAGNOSIS — E785 Hyperlipidemia, unspecified: Secondary | ICD-10-CM

## 2019-10-09 DIAGNOSIS — F329 Major depressive disorder, single episode, unspecified: Secondary | ICD-10-CM

## 2019-10-09 DIAGNOSIS — E119 Type 2 diabetes mellitus without complications: Secondary | ICD-10-CM

## 2019-10-09 DIAGNOSIS — M549 Dorsalgia, unspecified: Secondary | ICD-10-CM

## 2019-10-09 LAB — POCT GLYCOSYLATED HEMOGLOBIN (HGB A1C): Hemoglobin A1C: 6.1 % — AB (ref 4.0–5.6)

## 2019-10-09 LAB — GLUCOSE, CAPILLARY: Glucose-Capillary: 97 mg/dL (ref 70–99)

## 2019-10-09 NOTE — Telephone Encounter (Signed)
We were not abel to capture adequate retinal images with the retinal camera  today. Patient was assisted with scheduling with her eye doctor Dr. Tressia Miners office on  Tuesday, October 31, 2019 at 2:00PM. She was encouraged to call her insuracne3 comnay if she is concerned about the cost.

## 2019-10-09 NOTE — Progress Notes (Signed)
CC: Meet New PCP, Back Pain, Depression, AFIB, T2DM  HPI:  [name] is a 69 y.o. with a PMHx of rate controlled atrial fibrillation, type 2 diabetes mellitus, major depressive disorder, and chronic back pain who presents to clinic today to meet new PCP as well as follow up on some of her chronic issues. She states her atrial fibrillation has been well controlled and she follows up with her cardiologist regularly. She denies any recent episodes of chest pain that radiates anywhere or episodes of shortness of breath. She does note she will run out of her xarelto soon. She has applied for payment assistance for her medications, however, she has not heard back yet. She notes that this is a concern and causes her anxiety as she is worried about having a stroke.   She states she tries to follow a diabetic diet and eat whole grain breads, fruits/vegetables. She does not have a glucometer and does not check her glucose levels at home. She denies any numbness or tingling in her lower extremities. She denies any episodes of lightheadedness, sweats, fevers, or similar symptoms to when she has had low sugars in the past.   [name] also notes her depression is doing better. She has been attending counseling sessions with her husband as well as continuing to take her Cymbalta that she states is helping. She was also prescribed a medication for sleep by one of her husbands providers, she is unable to remember the name of the medication. I encouraged her to call the clinic back and let us know the medications she is on. Overall she states she is doing better and has more interest in doing things and her appetite has improved as well. She will be trying group therapy for the first time in two days.   She notes woresening of her chronic lower back pain. She states the pain is similar to her chronic back pain and does not radiate anywhere. She denies numbness or tingling in her legs, losing control  of her bowels or bladder, or weakness. She mentions that she tried physical therapy in the past and that when she left her appointments she felt worse. She states she has had trouble sleeping and that she thinks stress may contributing to her worsening back pain. She mentions having to sleep on her stomach to help relieve the pain at night.   She is still uncertain about whether or not she would like to have her COVID vaccine. When offered to discuss risk and benefits with her, she stated she would think about having the discussion with me at a later time.   [name] has no other complaints or concerns at the time of my evaluation.   Past Medical History:  Diagnosis Date  . Abnormal CT scan 03/16/2017  . Atrial fibrillation ([place])   . Bunion   . Carotid stenosis   . Carpal tunnel syndrome    mild   . Cervical arthritis   . Cholecystitis    s/p cholecystectomy  . Chronic back pain   . Chronic gastritis   . Depression   . Diabetic peripheral neuropathy ([place])   . Diarrhea 04/05/2015  . Digoxin toxicity    08/2011  . Diverticulitis    dx 04/08/14  . Diverticulosis   . Dysphagia    no documented strictures but responded positively to dilation in past.   . Epileptic seizure, tonic ([place])    .No meds since age of 56.  . Family  history of adverse reaction to anesthesia    "my mother may have"  . Fatty liver   . Fibromyalgia   . GERD (gastroesophageal reflux disease)   . H/O hiatal hernia    S/P hernia repair  . Hyperlipidemia   . Hypertension   . Hypothyroid   . Internal hemorrhoid   . Joint pain 05/08/2011  . Left knee pain 12/19/2014  . Migraine   . Mild cognitive impairment   . Muscle spasm of back 08/10/2013  . Nonspecific (abnormal) findings on radiological and other examination of gastrointestinal tract 08/07/2011  . OSA (obstructive sleep apnea) dx'd 2003   "can't sleep w/that mask; lost some weight; maybe that's helped" (04/26/2014)  . Osteoarthritis   . Pneumonia X 2  .  Right foot pain 08/03/2013  . Sarcoidosis   . Seizures ([place])    as a child, they stopped after age 65  . Skin neoplasm   . TIA (transient ischemic attack)    "don't know when I had it" (04/26/2014)  . Transaminase or LDH elevation   . Type II diabetes mellitus ([place])   . Ventral hernia s/p laparoscopic lysis of adhesions and hernia repair with mesh 07/29/11 08/01/2011   Review of Systems:  Review of Systems  Constitutional: Negative for fever.  Respiratory: Negative for cough and shortness of breath.   Cardiovascular: Negative for chest pain, palpitations and leg swelling.  Gastrointestinal: Negative for abdominal pain and diarrhea.  Genitourinary:       Denies loss of bowel or bladder function  Musculoskeletal: Positive for back pain. Negative for neck pain.  Neurological: Negative for weakness.  All other systems reviewed and are negative.  Vitals:   10/09/19 1321  BP: 122/80  Pulse: 76  Temp: 98.5 F (36.9 C)  TempSrc: Oral  SpO2: 100%  Weight: 162 lb 6.4 oz (73.7 kg)  Height: 5\' 3"  (1.6 m)   Physical Exam: Physical Exam Vitals and nursing note reviewed.  Constitutional:      General: She is not in acute distress.    Appearance: Normal appearance. She is not ill-appearing, toxic-appearing or diaphoretic.  HENT:     Head: Normocephalic and atraumatic.  Cardiovascular:     Rate and Rhythm: Normal rate. Rhythm irregular.     Heart sounds: Normal heart sounds. No murmur heard.  No gallop.   Pulmonary:     Effort: Pulmonary effort is normal. No respiratory distress.     Breath sounds: Normal breath sounds. No stridor. No wheezing, rhonchi or rales.  Musculoskeletal:     Cervical back: Normal range of motion and neck supple.     Right lower leg: No edema.     Left lower leg: No edema.  Skin:    General: Skin is warm and dry.     Coloration: Skin is not jaundiced.  Neurological:     General: No focal deficit present.     Mental Status: She is alert and oriented to  person, place, and time.  Psychiatric:        Mood and Affect: Mood normal.        Behavior: Behavior normal.    Assessment & Plan:   [name] is a 69 y/o F with a PMHx of rate controlled A.Fib on diltiazem and xarelto, essential HTN, T2DM, and major depressive disorder presents to clinic today to meet me, her new PCP, as well as follow up on a few of her chronic issues.   1.) Atrial Fibrillation  Condition is  stable and being followed regularly by cardiology. She is rate controlled on diltiazem and on xarelto. Her CHADSVASC2 score is 7. She denies any new chest pain, shortness of breath, or fluttering. She is unable to afford her xarelto, however, she has applied for medication assistance and awaiting the decision for this. She was given 3 samples as well as information on how to apply for 30 day free trial as well as $10-co pay program. I encouraged her to apply for both in case she is not accepted into the medication assistance program.   2. Essential Hypertension Blood pressure stable at 122/80, continue atenolol 25mg  and losartan 25 mg. Will continue to monitor annually.   3. Type 2 Diabetes Controlled Patient states she takes her metformin 500 mg regularly. Denies any change in bowel habits. Last A1C of 9.1 from 03/2019, A1C of 6.1 today. Congratulated patient on this and encouraged her to continue her current diet. Denies any numbness or tingling in her legs or symptoms of hypoglycemia. Eye exam performed by Ms. Butch Penny who was unable to complete exam as patient has history of cataract surgery. She assisted patient in completing a referral to her Opthalmologist Dr. Katy Fitch. Appreciate Ms. Donna's assistance.   4. Chronic Back Pain Patient notes worsening of low back pain. States the pain is similar to pain in the past. Denies any loss of bowel or bladder function, weakness, or numbness/tingling. Patient states she did not believe physical therapy helped and that she felt more pain after  her sessions. She notes that another provider wrote her for medication that helps with the back pain, but she is unable to remember what it is. Instructed her to call the clinic and inform us of the medication/dose. Also gave her instructions for back stretches. Instructed her to return to clinic in 4 weeks for a follow up or sooner if need be to further assess her back pain.   5. Major Depressive Disorder Patient states she feels much better than she has. She is still struggling with the passing of her daughter, but states the counseling sessions have helped. She is also attending group therapy sessions. She will continue the cymbalta 60 mg daily. Her PHQ-9 score dropped from 13 to 2. She states her interest in doing things has increased and her appetite has improved as well.   6. Mammogram Patient has referral in place, appointment just needs to be scheduled. Notified nursing staff of this who will assist in getting this scheduled.   Patient discussed with Dr. Dareen Piano who agrees with the plan.

## 2019-10-09 NOTE — Patient Instructions (Addendum)
Thank you for your visit with the Internal Medicine Clinic!   Today we talked about:   1.) Atrial Fibrillation:  Please continue to take your Diltiazem and Xarelto and follow up with cardiology as needed. If you do not hear back about your medication assistance program, please call our office to check the status. If you have any episodes of chest pain, shortness of breath, or fluttering in your chest that does not go away, please go to your nearest emergency department.   2.) Major Depressive Disorder:   I am glad you are feeling better! Please continue to attend your counseling and group therapy sessions. If you feel as though it is worsening please schedule an appointment to see [place] sooner.   3. Low Back Pain  Please continue to take tylenol for pain (follow instructions on bottle for dosing and do not exceed maximum daily doses) and attempt stretching exercises below. If your pain continues to worsen, please schedule an appointment to see [place] sooner. If you have any persistent numbness or tingling, loss of bowel or bladder, or unexpected weight loss, please call 911 or go to your closest emergency department.   4.) Type II Diabetes  Please continue your diabetic diet and take your medications as prescribed. Please follow up with Dr. Katy Fitch for your eye examination, if you need a referral, please call our office.   5.) Mammogram   We will refer you to have your mammogram performed.       Low Back Sprain or Strain Rehab Ask your health care provider which exercises are safe for you. Do exercises exactly as told by your health care provider and adjust them as directed. It is normal to feel mild stretching, pulling, tightness, or discomfort as you do these exercises. Stop right away if you feel sudden pain or your pain gets worse. Do not begin these exercises until told by your health care provider. Stretching and range-of-motion exercises These exercises warm up your muscles and joints and  improve the movement and flexibility of your back. These exercises also help to relieve pain, numbness, and tingling. Lumbar rotation  1. Lie on your back on a firm surface and bend your knees. 2. Straighten your arms out to your sides so each arm forms a 90-degree angle (right angle) with a side of your body. 3. Slowly move (rotate) both of your knees to one side of your body until you feel a stretch in your lower back (lumbar). Try not to let your shoulders lift off the floor. 4. Hold this position for __________ seconds. 5. Tense your abdominal muscles and slowly move your knees back to the starting position. 6. Repeat this exercise on the other side of your body. Repeat __________ times. Complete this exercise __________ times a day. Single knee to chest  1. Lie on your back on a firm surface with both legs straight. 2. Bend one of your knees. Use your hands to move your knee up toward your chest until you feel a gentle stretch in your lower back and buttock. ? Hold your leg in this position by holding on to the front of your knee. ? Keep your other leg as straight as possible. 3. Hold this position for __________ seconds. 4. Slowly return to the starting position. 5. Repeat with your other leg. Repeat __________ times. Complete this exercise __________ times a day. Prone extension on elbows  1. Lie on your abdomen on a firm surface (prone position). 2. Prop yourself up on  your elbows. 3. Use your arms to help lift your chest up until you feel a gentle stretch in your abdomen and your lower back. ? This will place some of your body weight on your elbows. If this is uncomfortable, try stacking pillows under your chest. ? Your hips should stay down, against the surface that you are lying on. Keep your hip and back muscles relaxed. 4. Hold this position for __________ seconds. 5. Slowly relax your upper body and return to the starting position. Repeat __________ times. Complete this  exercise __________ times a day. Strengthening exercises These exercises build strength and endurance in your back. Endurance is the ability to use your muscles for a long time, even after they get tired. Pelvic tilt This exercise strengthens the muscles that lie deep in the abdomen. 1. Lie on your back on a firm surface. Bend your knees and keep your feet flat on the floor. 2. Tense your abdominal muscles. Tip your pelvis up toward the ceiling and flatten your lower back into the floor. ? To help with this exercise, you may place a small towel under your lower back and try to push your back into the towel. 3. Hold this position for __________ seconds. 4. Let your muscles relax completely before you repeat this exercise. Repeat __________ times. Complete this exercise __________ times a day. Alternating arm and leg raises  1. Get on your hands and knees on a firm surface. If you are on a hard floor, you may want to use padding, such as an exercise mat, to cushion your knees. 2. Line up your arms and legs. Your hands should be directly below your shoulders, and your knees should be directly below your hips. 3. Lift your left leg behind you. At the same time, raise your right arm and straighten it in front of you. ? Do not lift your leg higher than your hip. ? Do not lift your arm higher than your shoulder. ? Keep your abdominal and back muscles tight. ? Keep your hips facing the ground. ? Do not arch your back. ? Keep your balance carefully, and do not hold your breath. 4. Hold this position for __________ seconds. 5. Slowly return to the starting position. 6. Repeat with your right leg and your left arm. Repeat __________ times. Complete this exercise __________ times a day. Abdominal set with straight leg raise  1. Lie on your back on a firm surface. 2. Bend one of your knees and keep your other leg straight. 3. Tense your abdominal muscles and lift your straight leg up, 4-6 inches  (10-15 cm) off the ground. 4. Keep your abdominal muscles tight and hold this position for __________ seconds. ? Do not hold your breath. ? Do not arch your back. Keep it flat against the ground. 5. Keep your abdominal muscles tense as you slowly lower your leg back to the starting position. 6. Repeat with your other leg. Repeat __________ times. Complete this exercise __________ times a day. Single leg lower with bent knees 1. Lie on your back on a firm surface. 2. Tense your abdominal muscles and lift your feet off the floor, one foot at a time, so your knees and hips are bent in 90-degree angles (right angles). ? Your knees should be over your hips and your lower legs should be parallel to the floor. 3. Keeping your abdominal muscles tense and your knee bent, slowly lower one of your legs so your toe touches the ground. 4. Lift  your leg back up to return to the starting position. ? Do not hold your breath. ? Do not let your back arch. Keep your back flat against the ground. 5. Repeat with your other leg. Repeat __________ times. Complete this exercise __________ times a day. Posture and body mechanics Good posture and healthy body mechanics can help to relieve stress in your body's tissues and joints. Body mechanics refers to the movements and positions of your body while you do your daily activities. Posture is part of body mechanics. Good posture means:  Your spine is in its natural S-curve position (neutral).  Your shoulders are pulled back slightly.  Your head is not tipped forward. Follow these guidelines to improve your posture and body mechanics in your everyday activities. Standing   When standing, keep your spine neutral and your feet about hip width apart. Keep a slight bend in your knees. Your ears, shoulders, and hips should line up.  When you do a task in which you stand in one place for a long time, place one foot up on a stable object that is 2-4 inches (5-10 cm)  high, such as a footstool. This helps keep your spine neutral. Sitting   When sitting, keep your spine neutral and keep your feet flat on the floor. Use a footrest, if necessary, and keep your thighs parallel to the floor. Avoid rounding your shoulders, and avoid tilting your head forward.  When working at a desk or a computer, keep your desk at a height where your hands are slightly lower than your elbows. Slide your chair under your desk so you are close enough to maintain good posture.  When working at a computer, place your monitor at a height where you are looking straight ahead and you do not have to tilt your head forward or downward to look at the screen. Resting  When lying down and resting, avoid positions that are most painful for you.  If you have pain with activities such as sitting, bending, stooping, or squatting, lie in a position in which your body does not bend very much. For example, avoid curling up on your side with your arms and knees near your chest (fetal position).  If you have pain with activities such as standing for a long time or reaching with your arms, lie with your spine in a neutral position and bend your knees slightly. Try the following positions: ? Lying on your side with a pillow between your knees. ? Lying on your back with a pillow under your knees. Lifting   When lifting objects, keep your feet at least shoulder width apart and tighten your abdominal muscles.  Bend your knees and hips and keep your spine neutral. It is important to lift using the strength of your legs, not your back. Do not lock your knees straight out.  Always ask for help to lift heavy or awkward objects. This information is not intended to replace advice given to you by your health care provider. Make sure you discuss any questions you have with your health care provider. Document Revised: 06/17/2018 Document Reviewed: 03/17/2018 Elsevier Patient Education  [employer]. .  If you have any new chest pain, shortness of breath, or constant fluttering, please go to your closest emergency department.

## 2019-10-10 ENCOUNTER — Other Ambulatory Visit: Payer: Self-pay | Admitting: Student

## 2019-10-10 ENCOUNTER — Other Ambulatory Visit: Payer: Self-pay | Admitting: Internal Medicine

## 2019-10-10 DIAGNOSIS — M546 Pain in thoracic spine: Secondary | ICD-10-CM | POA: Diagnosis not present

## 2019-10-10 DIAGNOSIS — M545 Low back pain: Secondary | ICD-10-CM | POA: Diagnosis not present

## 2019-10-10 DIAGNOSIS — Z1231 Encounter for screening mammogram for malignant neoplasm of breast: Secondary | ICD-10-CM

## 2019-10-10 DIAGNOSIS — M5134 Other intervertebral disc degeneration, thoracic region: Secondary | ICD-10-CM | POA: Diagnosis not present

## 2019-10-10 DIAGNOSIS — G8929 Other chronic pain: Secondary | ICD-10-CM | POA: Diagnosis not present

## 2019-10-10 DIAGNOSIS — M5136 Other intervertebral disc degeneration, lumbar region: Secondary | ICD-10-CM | POA: Diagnosis not present

## 2019-10-10 DIAGNOSIS — F322 Major depressive disorder, single episode, severe without psychotic features: Secondary | ICD-10-CM | POA: Diagnosis not present

## 2019-10-10 LAB — LIPID PANEL
Chol/HDL Ratio: 2.4 ratio (ref 0.0–4.4)
Cholesterol, Total: 102 mg/dL (ref 100–199)
HDL: 43 mg/dL (ref >39)
LDL Chol Calc (NIH): 27 mg/dL (ref 0–99)
Triglycerides: 205 mg/dL — ABNORMAL HIGH (ref 0–149)
VLDL Cholesterol Cal: 32 mg/dL (ref 5–40)

## 2019-10-10 LAB — MICROALBUMIN / CREATININE URINE RATIO
Creatinine, Urine: 184.4 mg/dL
Microalb/Creat Ratio: 11 mg/g creat (ref 0–29)
Microalbumin, Urine: 20.1 ug/mL

## 2019-10-11 NOTE — Progress Notes (Signed)
Internal Medicine Clinic Attending  I saw and evaluated the patient.  I personally confirmed the key portions of the history and exam documented by Dr. Katsadouros and I reviewed pertinent patient test results.  The assessment, diagnosis, and plan were formulated together and I agree with the documentation in the resident's note.  

## 2019-10-16 DIAGNOSIS — M545 Low back pain: Secondary | ICD-10-CM | POA: Diagnosis not present

## 2019-10-16 DIAGNOSIS — G8929 Other chronic pain: Secondary | ICD-10-CM | POA: Diagnosis not present

## 2019-10-16 DIAGNOSIS — M47814 Spondylosis without myelopathy or radiculopathy, thoracic region: Secondary | ICD-10-CM | POA: Diagnosis not present

## 2019-10-25 DIAGNOSIS — M47814 Spondylosis without myelopathy or radiculopathy, thoracic region: Secondary | ICD-10-CM | POA: Diagnosis not present

## 2019-11-02 ENCOUNTER — Telehealth: Payer: Self-pay

## 2019-11-02 DIAGNOSIS — R922 Inconclusive mammogram: Secondary | ICD-10-CM | POA: Diagnosis not present

## 2019-11-02 NOTE — Telephone Encounter (Signed)
**  XARELTO**PER [facility]-REPROCESSING APPLICATION--THEY HAD JUSTIN HELBERG STILL AS HER MD EVEN THOUGH THE APPLICATION WAS SUBMITTED WITH DR. Doristine Section [employer].  STILL WAITING ON APPROVAL.

## 2019-11-06 DIAGNOSIS — M545 Low back pain: Secondary | ICD-10-CM | POA: Diagnosis not present

## 2019-11-06 DIAGNOSIS — G8929 Other chronic pain: Secondary | ICD-10-CM | POA: Diagnosis not present

## 2019-11-06 DIAGNOSIS — M47816 Spondylosis without myelopathy or radiculopathy, lumbar region: Secondary | ICD-10-CM | POA: Diagnosis not present

## 2019-11-06 DIAGNOSIS — F4321 Adjustment disorder with depressed mood: Secondary | ICD-10-CM | POA: Diagnosis not present

## 2019-11-06 DIAGNOSIS — F331 Major depressive disorder, recurrent, moderate: Secondary | ICD-10-CM | POA: Diagnosis not present

## 2019-11-17 DIAGNOSIS — M47816 Spondylosis without myelopathy or radiculopathy, lumbar region: Secondary | ICD-10-CM | POA: Diagnosis not present

## 2019-12-01 DIAGNOSIS — M47816 Spondylosis without myelopathy or radiculopathy, lumbar region: Secondary | ICD-10-CM | POA: Diagnosis not present

## 2019-12-11 ENCOUNTER — Other Ambulatory Visit: Payer: Self-pay | Admitting: Internal Medicine

## 2019-12-11 DIAGNOSIS — E119 Type 2 diabetes mellitus without complications: Secondary | ICD-10-CM

## 2019-12-15 DIAGNOSIS — M47816 Spondylosis without myelopathy or radiculopathy, lumbar region: Secondary | ICD-10-CM | POA: Diagnosis not present

## 2019-12-29 DIAGNOSIS — M47816 Spondylosis without myelopathy or radiculopathy, lumbar region: Secondary | ICD-10-CM | POA: Diagnosis not present

## 2020-01-24 ENCOUNTER — Telehealth: Payer: Self-pay | Admitting: Cardiology

## 2020-01-24 NOTE — Telephone Encounter (Signed)
Patient calling the office for samples of medication:   1.  What medication and dosage are you requesting samples for? rivaroxaban (XARELTO) 20 MG TABS tablet  2.  Are you currently out of this medication? No.   Pt only has two days left. She is in the donut hole and her insurance will not cover it

## 2020-01-24 NOTE — Telephone Encounter (Signed)
Medication Samples have been provided to the patient.  Drug name: Xarelto       Strength: 20 mg        Qty: 3 bottles  LOT: [id number]  Exp.Date: 11/23

## 2020-01-29 DIAGNOSIS — M47814 Spondylosis without myelopathy or radiculopathy, thoracic region: Secondary | ICD-10-CM | POA: Diagnosis not present

## 2020-02-16 DIAGNOSIS — M47814 Spondylosis without myelopathy or radiculopathy, thoracic region: Secondary | ICD-10-CM | POA: Diagnosis not present

## 2020-02-26 ENCOUNTER — Telehealth: Payer: Self-pay | Admitting: Cardiology

## 2020-02-26 NOTE — Telephone Encounter (Signed)
Spoke with patient who states that she is in the [place] hole until Jan. 2022.  Patient states that she needs samples of Xarelto if able due to only having 2 tablets left. Advised patient that samples have been left at the front desk for pick up. Patient verbalized understanding.   Medication Samples have been provided to the patient.  Drug name: Xarelto     Strength: 20mg        Qty: 2 Bottles (14 tabs)  LOT: [id number]  Exp.Date: 08-23   Belinda Block Justis Dupas 11:03 AM 02/26/2020

## 2020-02-26 NOTE — Telephone Encounter (Signed)
Patient calling the office for samples of medication:   1.  What medication and dosage are you requesting samples for?    rivaroxaban (XARELTO) 20 MG TABS tablet  2.  Are you currently out of this medication?   Sig: TAKE 1 TABLET DAILY WITH SUPPER  Pt is in the donut whole and get not afford to buy them right now, she only has 2 left   Best number  [phone]

## 2020-03-06 DIAGNOSIS — M47814 Spondylosis without myelopathy or radiculopathy, thoracic region: Secondary | ICD-10-CM | POA: Diagnosis not present

## 2020-03-09 HISTORY — PX: EYE MUSCLE SURGERY: SHX370

## 2020-03-12 ENCOUNTER — Telehealth: Payer: Self-pay | Admitting: Cardiology

## 2020-03-12 NOTE — Telephone Encounter (Signed)
Thank you :)

## 2020-03-12 NOTE — Telephone Encounter (Signed)
Pt c/o medication issue:  1. Name of Medication: rivaroxaban (XARELTO) 20 MG TABS tablet   2. How are you currently taking this medication (dosage and times per day)? Take 1 tab Daily with Supper   3. Are you having a reaction (difficulty breathing--STAT)? NA   4. What is your medication issue? Pt went to the pharmacy to pick up this med today and it was going to be $475.  She stated she can not afford that and she is not sure what she can do?   Best number [phone]

## 2020-03-12 NOTE — Telephone Encounter (Signed)
Spoke with [name] who reports patient has a deductible to meet with her insurance plan. Patient will have to pay [phone]$ regardless of patient assistance. Patient unable to afford the deductible.   2 weeks worth of Xarelto and an application for Alcova placed at the front desk. If patient is approved through [place] she will pay 85$ per month for her medication, no deductible required. Patient is going to apply through [place] and let the office know if she is approved.   The other option if she is denied by [facility] is to change back to Coumadin. Patient verbalized understanding.

## 2020-03-12 NOTE — Telephone Encounter (Signed)
Called patient, advised that I could send a message to Dr.Hochrein and RN to make aware.  I asked if she had used co-pay card- and she has, but she can no longer use them. Patient was asked about patient assistance forms- and she would like to try this option. Will have RN mail out and have her bring it back.   Patient was thankful for call back- she has enough until Friday- advised her we would let her know where to go from here.   Thanks!

## 2020-03-26 ENCOUNTER — Telehealth: Payer: Self-pay | Admitting: Cardiology

## 2020-03-26 NOTE — Telephone Encounter (Signed)
Patient calling the office for samples of medication:   1.  What medication and dosage are you requesting samples for?  rivaroxaban (XARELTO) 20 MG TABS tablet [[id number]]   2.  Are you currently out of this medication? She will be out of it this Friday .  She can not pay $158 for 10 pills

## 2020-03-26 NOTE — Telephone Encounter (Signed)
Called patient to make aware out of office today, will check on samples when back in office tomorrow.  Left message to make aware.

## 2020-03-27 ENCOUNTER — Telehealth: Payer: Self-pay | Admitting: Cardiology

## 2020-03-27 NOTE — Telephone Encounter (Signed)
Spoke to patient  - still no samples available. She states she can not afford any her medication now if she has to  complete her deductible  - @ $426 .  She  Only has 2 pills let.  " I case I will have to do coumadin again."   RN offered her the patient assistance program papers to fill out ,but that will not help now . Patient states her husband went online and filled  Some type  Assistance but she does not know what it was and she states they have not heard back.   RN also recommend  Patient  To go online ant try   [facility] or   [facility]   Lower cost  On generic  Medication - most of her medication are generic.  RN also  ask patient if she mind if nurse  Have social /careguide worker may have some type of assistance.

## 2020-03-27 NOTE — Telephone Encounter (Signed)
Great and caring response.  Let me know if we cannot help her because it seems like we might need to use warfarin. I would have to refer her to our pharmacists in order to switch

## 2020-03-27 NOTE — Telephone Encounter (Signed)
Called informed patient - no samples available. She may call back to see if some arrived later today. Patient states the pharmacy informed her she only has prescription for  10 pills to pick up - and she states she did not know why she only had so few to pick up and the pharmacy could not answer her.  RN states she will call   [place] to find out why?   patient verbalized understanding.

## 2020-03-27 NOTE — Telephone Encounter (Signed)
Spoke to patient  - still no samples available. She states she can not afford any her medication now if she has to  complete her deductibble  - @ $426 .  She  Only has 2 pills let.  " I case I will have to do coumadin again."   RN offered her the patient assistance program papers to fill out ,but that will not help now . Patient states her husband went online and filled  Some type  Assistance but she does not know what it was and she states they have not heard back.   RN also recommend  Patient  To go online ant try   [facility] or   [facility]   Lower cost  On generic  Medication - most of her medication are generic.  Rn also  Ask patient if she mind if nurse  Have social /careguide worker may have some type of assistance.

## 2020-03-27 NOTE — Telephone Encounter (Signed)
Patient called and said that she had spoke to someone earlier about her medication Xarelto. Stated that she only have enough pills to last though Friday. Says that medication is too expensive to get. Please call patient

## 2020-03-28 NOTE — Telephone Encounter (Signed)
1. Patient can try [employer] Overall program (may be what she completed online) at [employer] or calling [phone]. Takes 2 business days and if approve patient will pay $85 for 1 month supply  2. Can complete patient assistance paperwork. Will need doctors signature, so will ne to come to office and complete process.  3. Discuss options with social worker  4. Transition to warfarin. New Coumadin appointment available at [facility] tomorrow 1/21 at 10:30am

## 2020-03-28 NOTE — Telephone Encounter (Signed)
Patient following up.

## 2020-03-28 NOTE — Telephone Encounter (Signed)
Spoke to patient. Aware to come to office to discuss the change to warfarin.  conversation for @ 10- 15 min. Rn had to redirect patient to  The medication in hand. Patient started talking about the pain she is in and another Doctor's office. Patient verbalized undestanding

## 2020-03-29 ENCOUNTER — Other Ambulatory Visit: Payer: Self-pay

## 2020-03-29 ENCOUNTER — Ambulatory Visit: Payer: Medicare Other

## 2020-03-29 ENCOUNTER — Ambulatory Visit: Payer: PPO

## 2020-03-29 NOTE — Patient Instructions (Signed)
Patient on Xarelto 20mg .  Will research options to afford.  She will back to [place] with decision.

## 2020-04-12 ENCOUNTER — Telehealth: Payer: Self-pay

## 2020-04-12 NOTE — Telephone Encounter (Signed)
lpmtcb regarding Coumadin start.  Xarelto was denied, apparently.  I told her that I would reach out Monday 2/7, because she's having surgery today. 2/4

## 2020-04-15 NOTE — Telephone Encounter (Signed)
lpmtcb regarding Xarelto patient assistance denial and restart of Warfarin.

## 2020-04-17 NOTE — Telephone Encounter (Signed)
I spoke to the patient and she is willing to transition to Warfarin.  She is recovering from back surgery and will call us when available to come in for visit.

## 2020-04-19 NOTE — Telephone Encounter (Signed)
I spoke to the patient and informed her that a representative for Xarelto would bring some samples today and that I would reach out to her once arrived.

## 2020-04-19 NOTE — Telephone Encounter (Signed)
Informed patient that Xarelto 20 mg is ready for pick up.

## 2020-04-26 ENCOUNTER — Telehealth: Payer: Self-pay

## 2020-04-26 NOTE — Telephone Encounter (Signed)
lpmtcb in regards to transition from Xarelto to Coumadin.

## 2020-04-29 ENCOUNTER — Telehealth: Payer: Self-pay

## 2020-04-29 NOTE — Telephone Encounter (Signed)
lpmtcb in regards to transitioning from Xarelto to Warfarin.

## 2020-04-30 MED ORDER — WARFARIN SODIUM 2.5 MG PO TABS
ORAL_TABLET | ORAL | 1 refills | Status: DC
Start: 2020-04-30 — End: 2020-07-29

## 2020-04-30 NOTE — Telephone Encounter (Signed)
Discussed transition from Xarelto to Warfarin and scheduled appointment for 3/2 @ [facility].  She verbalized understanding

## 2020-05-10 ENCOUNTER — Other Ambulatory Visit: Payer: Self-pay

## 2020-05-10 ENCOUNTER — Ambulatory Visit (INDEPENDENT_AMBULATORY_CARE_PROVIDER_SITE_OTHER): Payer: Medicare Other

## 2020-05-10 DIAGNOSIS — I4891 Unspecified atrial fibrillation: Secondary | ICD-10-CM

## 2020-05-10 DIAGNOSIS — Z5181 Encounter for therapeutic drug level monitoring: Secondary | ICD-10-CM | POA: Diagnosis not present

## 2020-05-10 LAB — POCT INR: INR: 2 (ref 2.0–3.0)

## 2020-05-10 NOTE — Patient Instructions (Signed)
Take 1 tablet Daily.  INR check 4 weeks

## 2020-06-07 ENCOUNTER — Other Ambulatory Visit: Payer: Self-pay

## 2020-06-07 ENCOUNTER — Ambulatory Visit (INDEPENDENT_AMBULATORY_CARE_PROVIDER_SITE_OTHER): Payer: Medicare Other

## 2020-06-07 DIAGNOSIS — Z5181 Encounter for therapeutic drug level monitoring: Secondary | ICD-10-CM

## 2020-06-07 DIAGNOSIS — I4891 Unspecified atrial fibrillation: Secondary | ICD-10-CM

## 2020-06-07 LAB — POCT INR: INR: 2.1 (ref 2.0–3.0)

## 2020-06-07 NOTE — Patient Instructions (Signed)
Take 1 tablet Daily.  INR check 6 weeks

## 2020-07-18 ENCOUNTER — Telehealth: Payer: Self-pay | Admitting: *Deleted

## 2020-07-18 NOTE — Telephone Encounter (Signed)
   [facility] Pre-operative Risk Assessment    Patient Name: [name]  DOB: Jul 24, 1950  MRN: [id number]   [place]: - Please ensure there is not already an duplicate clearance open for this procedure. - Under Visit Info/Reason for Call, type in Other and utilize the format Clearance MM/DD/YY or Clearance TBD. Do not use dashes or single digits. - If request is for dental extraction, please clarify the # of teeth to be extracted.  Request for surgical clearance:  1. What type of surgery is being performed? Occipital nerve block   2. When is this surgery scheduled? 07/24/20  3. What type of clearance is required (medical clearance vs. Pharmacy clearance to hold med vs. Both)? both  4. Are there any medications that need to be held prior to surgery and how long?warfarin-5 days prior   5. Practice name and name of physician performing surgery? [facility]   6. What is the office phone number? [phone]   7.   What is the office fax number? [phone]  8.   Anesthesia type (None, local, MAC, general) ? Not listed   Fredia Beets 07/18/2020, 2:12 PM  _________________________________________________________________   (provider comments below)

## 2020-07-18 NOTE — Telephone Encounter (Signed)
Patient with diagnosis of atrial fibrillation on warfarin for anticoagulation.    Procedure: occipital nerve block Date of procedure: 07/24/20   CHA2DS2-VASc Score = 6  This indicates a 9.7% annual risk of stroke. The patient's score is based upon: CHF History: No HTN History: Yes Diabetes History: Yes Stroke History: Yes Vascular Disease History: No Age Score: 1 Gender Score: 1   Note: chart indicates prior diagnosis of TIA, first noted Feb 2016 with comment "don't know when I had it".     CrCl 51.5 Platelet count 260  Would recommend okay to hold warfarin for 5 days prior to procedure without Lovenox bridge, but will have primary cardiologist review to confirm.

## 2020-07-18 NOTE — Telephone Encounter (Signed)
I previously calculated her [name] has a CHA2DS2 - VASc score of 2 before.  There is a very questionable history of any prior cerebrovascular event.  I think that Lovenox bridging is not indicated.  Marland Kitchen

## 2020-07-19 ENCOUNTER — Other Ambulatory Visit: Payer: Self-pay

## 2020-07-19 ENCOUNTER — Ambulatory Visit (INDEPENDENT_AMBULATORY_CARE_PROVIDER_SITE_OTHER): Payer: Medicare Other

## 2020-07-19 DIAGNOSIS — I4891 Unspecified atrial fibrillation: Secondary | ICD-10-CM | POA: Diagnosis not present

## 2020-07-19 DIAGNOSIS — Z5181 Encounter for therapeutic drug level monitoring: Secondary | ICD-10-CM | POA: Diagnosis not present

## 2020-07-19 LAB — POCT INR: INR: 2.2 (ref 2.0–3.0)

## 2020-07-19 NOTE — Telephone Encounter (Signed)
   Name: Prabhleen Montemayor  DOB: 06/15/1950  MRN: [id number]   Primary Cardiologist: Minus Breeding, MD  Chart reviewed as part of pre-operative protocol coverage. Patient was contacted 07/19/2020 in reference to pre-operative risk assessment for pending surgery as outlined below.  Lujain Kanya Potteiger was last seen on 07/28/19 by Dr. Percival Spanish.She had stress test 08/16/19 which was low risk. Discussed via phone she shares with me that her atrial fibrillation is at her baseline and she has no new chest pain nor dyspnea.  Since last appointment, [name] Mohini Heathcock has done well.  Therefore, based on ACC/AHA guidelines, the patient would be at acceptable risk for the planned procedure without further cardiovascular testing.   Per pharmacy review and her primary cardiologist she may hold Warfarin 5 days prior to the planned procedure. She verbalized understanding of these instructions.   The patient was advised that if she develops new symptoms prior to surgery to contact our office to arrange for a follow-up visit, and she verbalized understanding.  I will route this recommendation to the requesting party via Epic fax function and remove from pre-op pool. Please call with questions.  Loel Dubonnet, NP 07/19/2020, 8:50 AM

## 2020-07-19 NOTE — Patient Instructions (Signed)
Hold Warfarin 5 days 5/13-5/17 and then Take 1 tablet Daily.  INR check 2 weeks

## 2020-07-29 ENCOUNTER — Ambulatory Visit: Payer: Medicare Other | Admitting: Cardiology

## 2020-07-29 ENCOUNTER — Other Ambulatory Visit: Payer: Self-pay | Admitting: Cardiology

## 2020-08-22 NOTE — Progress Notes (Signed)
Cardiology Office Note   Date:  08/23/2020   ID:  Kimberly, [name] 1950/09/30, MRN [id number]  PCP:  Reggie Pile, MD  Cardiologist:   Minus Breeding, MD   No chief complaint on file.     History of Present Illness: [name] is a 70 y.o. female who presents for follow-up of atrial fibrillation. She has permanent atrial fibrillation.  Since I last saw her she has had no acute complaints.  She does not really notice her atrial fibrillation.  She is not having any problems taking her blood thinner.  She still grieving the death of her daughter from the spindle cell sarcoma of the heart.  She is not describing any more the chest discomfort that was evaluated last year with a stress test.  She has had no change since that time.    Past Medical History:  Diagnosis Date   Abnormal CT scan 03/16/2017   Atrial fibrillation (HCC)    Bunion    Carotid stenosis    Carpal tunnel syndrome    mild    Cervical arthritis    Cholecystitis    s/p cholecystectomy   Chronic back pain    Chronic gastritis    Depression    Diabetic peripheral neuropathy ([place])    Diarrhea 04/05/2015   Digoxin toxicity    08/2011   Diverticulitis    dx 04/08/14   Diverticulosis    Dysphagia    no documented strictures but responded positively to dilation in past.    Epileptic seizure, tonic ([place])    .No meds since age of 24.   Family history of adverse reaction to anesthesia    "my mother may have"   Fatty liver    Fibromyalgia    GERD (gastroesophageal reflux disease)    H/O hiatal hernia    S/P hernia repair   Hyperlipidemia    Hypertension    Hypothyroid    Internal hemorrhoid    Joint pain 05/08/2011   Left knee pain 12/19/2014   Migraine    Mild cognitive impairment    Muscle spasm of back 08/10/2013   Nonspecific (abnormal) findings on radiological and other examination of gastrointestinal tract 08/07/2011   OSA (obstructive sleep apnea) dx'd 2003   "can't sleep  w/that mask; lost some weight; maybe that's helped" (04/26/2014)   Osteoarthritis    Pneumonia X 2   Right foot pain 08/03/2013   Sarcoidosis    Seizures ([place])    as a child, they stopped after age 73   Skin neoplasm    TIA (transient ischemic attack)    "don't know when I had it" (04/26/2014)   Transaminase or LDH elevation    Type II diabetes mellitus ([place])    Ventral hernia s/p laparoscopic lysis of adhesions and hernia repair with mesh 07/29/11 08/01/2011    Past Surgical History:  Procedure Laterality Date   ABDOMINAL HYSTERECTOMY  1980's   partial   BILATERAL OOPHORECTOMY  1980's   "after partial hysterectomy"   BREAST BIOPSY Right    benign   BREAST EXCISIONAL BIOPSY Right 03/2006   benign   BREAST SURGERY Right ~ 2010   excision milk duct;   BUNIONECTOMY Bilateral    CATARACT EXTRACTION W/PHACO  10/27/2010   Procedure: CATARACT EXTRACTION PHACO AND INTRAOCULAR LENS PLACEMENT ([place]);  Surgeon: Williams Che;  Location: [facility];  Service: Ophthalmology;  Laterality: Left;  CDE- 1.78   CATARACT EXTRACTION W/PHACO  07/13/2011   Procedure:  CATARACT EXTRACTION PHACO AND INTRAOCULAR LENS PLACEMENT ([facility]);  Surgeon: Williams Che, MD;  Location: [facility];  Service: Ophthalmology;  Laterality: Right;  CDE:  1.65   DILATION AND CURETTAGE OF UTERUS  1970; [place]  multiple   FOOT SURGERY Left ~ 2011   "straightened toe & scraped bone below big toe"   FOOT SURGERY Right ~ 2011   "scraped bone of big toe; shortened middle toet"   [place]   "had to have scar tissue removed 6 months after repair"   Inverted nipples  1992   LAPAROSCOPIC CHOLECYSTECTOMY  1980's   LIPOSUCTION  1992   SALPINGOOPHORECTOMY Bilateral    "6 months or so after hysterectomy"   SKIN CANCER EXCISION  1990's   "front side of right shin"   [place]  07/29/2011   Procedure: LAPAROSCOPIC VENTRAL HERNIA;  Surgeon: Adin Hector, MD;   Location: [facility];  Service: General;  Laterality: N/A;  multiple incarcerated hernias with mesh     Current Outpatient Medications  Medication Sig Dispense Refill   acetaminophen (TYLENOL) 500 MG tablet Take 500 mg by mouth every 6 (six) hours as needed for mild pain or headache.     atenolol (TENORMIN) 25 MG tablet Take 1.5 tablets (37.5 mg total) by mouth daily. 135 tablet 3   Cholecalciferol (VITAMIN D3 PO) Take 5,000 Int'l Units by mouth daily.     clonazePAM (KLONOPIN) 0.5 MG tablet Take by mouth.     diltiazem (CARDIZEM CD) 180 MG 24 hr capsule Take 1 capsule (180 mg total) by mouth daily. 90 capsule 3   docusate sodium (COLACE) 100 MG capsule Take 100 mg by mouth daily as needed for mild constipation.     DULoxetine (CYMBALTA) 60 MG capsule Take 1 capsule (60 mg total) by mouth daily. 90 capsule 3   famotidine (PEPCID) 20 MG tablet Take 1 tablet (20 mg total) by mouth daily. 90 tablet 3   levothyroxine (SYNTHROID) 50 MCG tablet Take 1 tablet (50 mcg total) by mouth every morning. 90 tablet 3   losartan (COZAAR) 25 MG tablet Take 1 tablet (25 mg total) by mouth daily. 90 tablet 3   metFORMIN (GLUCOPHAGE-XR) 500 MG 24 hr tablet TAKE 2 TABLETS(1000 MG) BY MOUTH TWICE DAILY WITH FOOD 360 tablet 1   Multiple Vitamin (MULTIVITAMIN) tablet Take 1 tablet by mouth daily.     Propylene Glycol (SYSTANE BALANCE) 0.6 % SOLN Place 1 drop into both eyes daily as needed (dry eyes).     rosuvastatin (CRESTOR) 20 MG tablet Take 1 tablet (20 mg total) by mouth daily. 90 tablet 3   vitamin B-12 (CYANOCOBALAMIN) 500 MCG tablet Take 500 mcg by mouth daily.     warfarin (COUMADIN) 2.5 MG tablet TAKE 1 TO 2 TABLETS BY MOUTH DAILY AS DIRECTED BY COUMADIN CLINIC 90 tablet 1   No current facility-administered medications for this visit.    Allergies:   Gemfibrozil, Latex, Penicillins, Adhesive [tape], Codeine, Digoxin and related, Metformin and related, Omeprazole, and Ace inhibitors    ROS:  Please see the  history of present illness.   Otherwise, review of systems are positive for migraines and a fall.   All other systems are reviewed and negative.    PHYSICAL EXAM: VS:  BP 110/70 (BP Location: Left Arm, Patient Position: Sitting, Cuff Size: Normal)   Pulse 76   Resp 18   Ht 5\' 3"  (  1.6 m)   Wt 161 lb (73 kg)   LMP 06/03/1978   SpO2 98%   BMI 28.52 kg/m  , BMI Body mass index is 28.52 kg/m. GENERAL:  Well appearing NECK:  No jugular venous distention, waveform within normal limits, carotid upstroke brisk and symmetric, no bruits, no thyromegaly LUNGS:  Clear to auscultation bilaterally CHEST:  Unremarkable HEART:  PMI not displaced or sustained,S1 and S2 within normal limits, no S3, no clicks, no rubs, no murmurs, irregular  ABD:  Flat, positive bowel sounds normal in frequency in pitch, no bruits, no rebound, no guarding, no midline pulsatile mass, no hepatomegaly, no splenomegaly EXT:  2 plus pulses throughout, no edema, no cyanosis no clubbing      EKG:  EKG is ordered today. The ekg ordered today demonstrates atrial fibrillation, rate 76, interventricular conduction delay, no acute ST-T wave changes.   Recent Labs: No results found for requested labs within last 8760 hours.    Lipid Panel    Component Value Date/Time   CHOL 102 10/09/2019 1517   TRIG 205 (H) 10/09/2019 1517   HDL 43 10/09/2019 1517   CHOLHDL 2.4 10/09/2019 1517   CHOLHDL 2.9 04/19/2014 1603   VLDL 36 04/19/2014 1603   LDLCALC 27 10/09/2019 1517      Wt Readings from Last 3 Encounters:  08/23/20 161 lb (73 kg)  10/09/19 162 lb 6.4 oz (73.7 kg)  08/23/19 166 lb (75.3 kg)      Other studies Reviewed: Additional studies/ records that were reviewed today include: Labs. Review of the above records demonstrates:  Please see elsewhere in the note.     ASSESSMENT AND PLAN:  ATRIAL FIB:   Ms. CEAIRRA MCCARVER has a CHA2DS2 - VASc score of 4.    Patient tolerates anticoagulation and has reasonable  rate control.  She had recent labs that were okay.  This included normal CBC and basic metabolic profile.  No change in therapy is indicated.    Current medicines are reviewed at length with the patient today.  The patient does not have concerns regarding medicines.  The following changes have been made:  no change  Labs/ tests ordered today include:   Orders Placed This Encounter  Procedures   EKG 12-Lead      Disposition:   FU with me in one year.     Signed, Minus Breeding, MD  08/23/2020 12:12 PM    [place]

## 2020-08-23 ENCOUNTER — Encounter: Payer: Self-pay | Admitting: Cardiology

## 2020-08-23 ENCOUNTER — Ambulatory Visit (INDEPENDENT_AMBULATORY_CARE_PROVIDER_SITE_OTHER): Payer: Medicare Other | Admitting: Cardiology

## 2020-08-23 ENCOUNTER — Other Ambulatory Visit: Payer: Self-pay

## 2020-08-23 ENCOUNTER — Ambulatory Visit (INDEPENDENT_AMBULATORY_CARE_PROVIDER_SITE_OTHER): Payer: Medicare Other

## 2020-08-23 VITALS — BP 110/70 | HR 76 | Resp 18 | Ht 63.0 in | Wt 161.0 lb

## 2020-08-23 DIAGNOSIS — I4891 Unspecified atrial fibrillation: Secondary | ICD-10-CM | POA: Diagnosis not present

## 2020-08-23 DIAGNOSIS — Z5181 Encounter for therapeutic drug level monitoring: Secondary | ICD-10-CM

## 2020-08-23 DIAGNOSIS — R072 Precordial pain: Secondary | ICD-10-CM | POA: Diagnosis not present

## 2020-08-23 DIAGNOSIS — I482 Chronic atrial fibrillation, unspecified: Secondary | ICD-10-CM | POA: Diagnosis not present

## 2020-08-23 LAB — POCT INR: INR: 2.2 (ref 2.0–3.0)

## 2020-08-23 NOTE — Patient Instructions (Signed)
Medication Instructions:  Your physician recommends that you continue on your current medications as directed. Please refer to the Current Medication list given to you today.  *If you need a refill on your cardiac medications before your next appointment, please call your pharmacy*   Lab Work: None ordered.   Testing/Procedures: None ordered.    Follow-Up: At [facility], you and your health needs are our priority.  As part of our continuing mission to provide you with exceptional heart care, we have created designated Provider Care Teams.  These Care Teams include your primary Cardiologist (physician) and Advanced Practice Providers (APPs -  Physician Assistants and Nurse Practitioners) who all work together to provide you with the care you need, when you need it.  We recommend signing up for the patient portal called "MyChart".  Sign up information is provided on this After Visit Summary.  MyChart is used to connect with patients for Virtual Visits (Telemedicine).  Patients are able to view lab/test results, encounter notes, upcoming appointments, etc.  Non-urgent messages can be sent to your provider as well.   To learn more about what you can do with MyChart, go to [email or web address].    Your next appointment:   12 month(s)  The format for your next appointment:   In Person  Provider:   Minus Breeding, MD

## 2020-08-23 NOTE — Patient Instructions (Signed)
Continue 1 tablet Daily.  INR check 6 weeks

## 2020-10-04 ENCOUNTER — Ambulatory Visit (INDEPENDENT_AMBULATORY_CARE_PROVIDER_SITE_OTHER): Payer: Medicare Other

## 2020-10-04 ENCOUNTER — Other Ambulatory Visit: Payer: Self-pay

## 2020-10-04 DIAGNOSIS — Z5181 Encounter for therapeutic drug level monitoring: Secondary | ICD-10-CM

## 2020-10-04 DIAGNOSIS — I4891 Unspecified atrial fibrillation: Secondary | ICD-10-CM

## 2020-10-04 LAB — POCT INR: INR: 1.6 — AB (ref 2.0–3.0)

## 2020-10-04 NOTE — Patient Instructions (Signed)
Take 2 tablets tonight only and then Continue 1 tablet Daily.  INR check 2 weeks

## 2020-10-09 ENCOUNTER — Encounter: Payer: Self-pay | Admitting: Internal Medicine

## 2020-10-18 ENCOUNTER — Ambulatory Visit (INDEPENDENT_AMBULATORY_CARE_PROVIDER_SITE_OTHER): Payer: Medicare Other

## 2020-10-18 ENCOUNTER — Other Ambulatory Visit: Payer: Self-pay

## 2020-10-18 DIAGNOSIS — I4891 Unspecified atrial fibrillation: Secondary | ICD-10-CM | POA: Diagnosis not present

## 2020-10-18 DIAGNOSIS — Z5181 Encounter for therapeutic drug level monitoring: Secondary | ICD-10-CM

## 2020-10-18 LAB — POCT INR: INR: 1.7 — AB (ref 2.0–3.0)

## 2020-10-18 MED ORDER — WARFARIN SODIUM 2.5 MG PO TABS: ORAL_TABLET | ORAL | 1 refills | Status: DC

## 2020-10-18 NOTE — Patient Instructions (Signed)
Take 2 tablets tonight only and then increase to 1 tablet Daily, except Wednesday take 1.5 tablets.  INR check 2 weeks

## 2020-11-01 ENCOUNTER — Other Ambulatory Visit: Payer: Self-pay

## 2020-11-01 ENCOUNTER — Ambulatory Visit (INDEPENDENT_AMBULATORY_CARE_PROVIDER_SITE_OTHER): Payer: Medicare Other

## 2020-11-01 DIAGNOSIS — I4891 Unspecified atrial fibrillation: Secondary | ICD-10-CM

## 2020-11-01 DIAGNOSIS — Z5181 Encounter for therapeutic drug level monitoring: Secondary | ICD-10-CM

## 2020-11-01 LAB — POCT INR: INR: 1.8 — AB (ref 2.0–3.0)

## 2020-11-01 NOTE — Patient Instructions (Signed)
Description   Take 2 tablets tonight only and then continue 1 tablet daily, except 1.5 tablets on Wednesday.  INR check 2 weeks

## 2020-11-05 ENCOUNTER — Telehealth: Payer: Self-pay

## 2020-11-05 ENCOUNTER — Ambulatory Visit (INDEPENDENT_AMBULATORY_CARE_PROVIDER_SITE_OTHER): Payer: Medicare Other | Admitting: Internal Medicine

## 2020-11-05 VITALS — BP 98/62 | HR 94 | Ht 63.0 in | Wt 161.0 lb

## 2020-11-05 DIAGNOSIS — Z7901 Long term (current) use of anticoagulants: Secondary | ICD-10-CM

## 2020-11-05 DIAGNOSIS — K582 Mixed irritable bowel syndrome: Secondary | ICD-10-CM

## 2020-11-05 DIAGNOSIS — K219 Gastro-esophageal reflux disease without esophagitis: Secondary | ICD-10-CM

## 2020-11-05 DIAGNOSIS — Z86010 Personal history of colonic polyps: Secondary | ICD-10-CM | POA: Diagnosis not present

## 2020-11-05 DIAGNOSIS — I4891 Unspecified atrial fibrillation: Secondary | ICD-10-CM | POA: Diagnosis not present

## 2020-11-05 MED ORDER — SUTAB 1479-225-188 MG PO TABS: 1.0000 | ORAL_TABLET | Freq: Once | ORAL | 0 refills | Status: AC

## 2020-11-05 NOTE — Telephone Encounter (Signed)
Patient with diagnosis of Atrial fibrillation on warfarin for anticoagulation.    Procedure: colonoscopy Date of procedure: 01/24/21   CHA2DS2-VASc Score = 6  This indicates a 9.7% annual risk of stroke. The patient's score is based upon: CHF History: No HTN History: Yes Diabetes History: Yes Stroke History: Yes Vascular Disease History: No Age Score: 1 Gender Score: 1   CrCl 61 (with adjusted body weight) Platelet count 227  Chart indicates prior history of TIA, however also notes that patient doesn't know when this happened.  Sept 2015 OV noted hx of 2 prior TIA's, was bridged with enoxaparin at that time.  Would recommend patient okay to hold warfarin for 5 days without bridge, but will defer to primary cardiologist for final determination.

## 2020-11-05 NOTE — Telephone Encounter (Signed)
[facility] Pre-operative Risk Assessment     Request for surgical clearance:     Endoscopy Procedure  What type of surgery is being performed?     Colonoscopy   When is this surgery scheduled?     01/24/2021  What type of clearance is required ?   Pharmacy  Are there any medications that need to be held prior to surgery and how long? Coumadin - 5 days  Practice name and name of physician performing surgery?      [facility]  What is your office phone and fax number?      Phone- [phone]  Fax364 [phone]  Anesthesia type (None, local, MAC, general) ?       MAC

## 2020-11-05 NOTE — Patient Instructions (Signed)
If you are age 72 or older, your body mass index should be between 23-30. Your Body mass index is 28.52 kg/m. If this is out of the aforementioned range listed, please consider follow up with your Primary Care Provider.  If you are age 69 or younger, your body mass index should be between 19-25. Your Body mass index is 28.52 kg/m. If this is out of the aformentioned range listed, please consider follow up with your Primary Care Provider.   __________________________________________________________  The [place] [facility] providers would like to encourage you to use [facility] to communicate with providers for non-urgent requests or questions.  Due to long hold times on the telephone, sending your provider a message by [facility] may be a faster and more efficient way to get a response.  Please allow 48 business hours for a response.  Please remember that this is for non-urgent requests.   You have been scheduled for a colonoscopy. Please follow written instructions given to you at your visit today.  Please pick up your prep supplies at the pharmacy within the next 1-3 days. If you use inhalers (even only as needed), please bring them with you on the day of your procedure.

## 2020-11-05 NOTE — Telephone Encounter (Signed)
Pharmacy, can you please comment on how long Coumadin can be held for upcoming colonoscopy?  Thank you!

## 2020-11-08 NOTE — Telephone Encounter (Signed)
Waiting on reply from Dr. Percival Spanish

## 2020-11-08 NOTE — Telephone Encounter (Signed)
He did, see below - he is requesting to see if GI is ok with pt holding warfarin for 3 days, in which case she would not need a Lovenox bridge.

## 2020-11-08 NOTE — Telephone Encounter (Signed)
Minus Breeding, MD  Cv Div Preop 3 days ago   Would GI be OK holding warfarin for 3 days only then I would not suggest a bridge?

## 2020-11-08 NOTE — Telephone Encounter (Signed)
   Primary Cardiologist: Minus Breeding, MD  Chart reviewed as part of pre-operative protocol coverage. Given past medical history and time since last visit, based on ACC/AHA guidelines, [name] would be at acceptable risk for the planned procedure without further cardiovascular testing.   Patient with diagnosis of Atrial fibrillation on warfarin for anticoagulation.     Procedure: colonoscopy Date of procedure: 01/24/21     CHA2DS2-VASc Score = 6  This indicates a 9.7% annual risk of stroke. The patient's score is based upon: CHF History: No HTN History: Yes Diabetes History: Yes Stroke History: Yes Vascular Disease History: No Age Score: 1 Gender Score: 1   CrCl 61 (with adjusted body weight) Platelet count 227   Chart indicates prior history of TIA, however also notes that patient doesn't know when this happened.  Sept 2015 OV noted hx of 2 prior TIA's.  We recommend holding warfarin for 3 days without Lovenox bridge.  I will route this recommendation to the requesting party via Epic fax function and remove from pre-op pool.  Please call with questions.  Jossie Ng. Lola Czerwonka NP-C    11/08/2020, 9:34 AM [address] Office [phone] Fax [phone]

## 2020-11-12 ENCOUNTER — Telehealth: Payer: Self-pay

## 2020-11-12 NOTE — Telephone Encounter (Signed)
Spoke with patient and told her she could hold her Coumadin for 3 days prior to procedure.  Patient agreed.

## 2020-11-13 ENCOUNTER — Encounter: Payer: Self-pay | Admitting: Internal Medicine

## 2020-11-13 NOTE — Progress Notes (Signed)
HISTORY OF PRESENT ILLNESS:  [name] is a 70 y.o. female with past medical history as listed below who presents today regarding surveillance colonoscopy.  Remote patient of Dr. Verl Blalock.  She has a history of GERD with previous fundoplication.  Other abdominal surgeries include cholecystectomy, hysterectomy, and ventral hernia repair.  Last upper endoscopy with Dr. Sharlett Iles August 2014 was normal post fundoplication.  Her last complete colonoscopy was performed February 2019.  She had adenomatous colon polyps and sessile serrated polyps.  Follow-up in 3 years recommended.  She had her Xarelto therapy interrupted for her procedure.  Patient does have irritable bowel with alternating bowel habits.  This continues.  Previous random colon biopsies were unremarkable.  She has had the COVID infection.  She is accompanied today by her husband.  She complains of belching for which she takes Tums.  She does take Prilosec for GERD.  No dysphagia.  She is on metformin for diabetes.  She is on Coumadin for her atrial fibrillation.  She does have a prior history of C. difficile.  Review of blood work from 2 months ago shows normal liver test.  Hemoglobin 13.6.  Hemoglobin A1c 6.9.  CT of the abdomen and pelvis with contrast January 2019 revealed sigmoid diverticulosis with colonic thickening.  Postoperative change.  No acute findings.   REVIEW OF SYSTEMS:  All non-GI ROS negative unless otherwise stated in the HPI except for fibromyalgia  Past Medical History:  Diagnosis Date   Abnormal CT scan 03/16/2017   Atrial fibrillation (HCC)    Bunion    Carotid stenosis    Carpal tunnel syndrome    mild    Cervical arthritis    Cholecystitis    s/p cholecystectomy   Chronic back pain    Chronic gastritis    Depression    Diabetic peripheral neuropathy ([place])    Diarrhea 04/05/2015   Digoxin toxicity    08/2011   Diverticulitis    dx 04/08/14   Diverticulosis    Dysphagia    no documented  strictures but responded positively to dilation in past.    Epileptic seizure, tonic ([place])    .No meds since age of 69.   Family history of adverse reaction to anesthesia    "my mother may have"   Fatty liver    Fibromyalgia    GERD (gastroesophageal reflux disease)    H/O hiatal hernia    S/P hernia repair   Hyperlipidemia    Hypertension    Hypothyroid    Internal hemorrhoid    Joint pain 05/08/2011   Left knee pain 12/19/2014   Migraine    Mild cognitive impairment    Muscle spasm of back 08/10/2013   Nonspecific (abnormal) findings on radiological and other examination of gastrointestinal tract 08/07/2011   OSA (obstructive sleep apnea) dx'd 2003   "can't sleep w/that mask; lost some weight; maybe that's helped" (04/26/2014)   Osteoarthritis    Pneumonia X 2   Right foot pain 08/03/2013   Sarcoidosis    Seizures ([place])    as a child, they stopped after age 1   Skin neoplasm    TIA (transient ischemic attack)    "don't know when I had it" (04/26/2014)   Transaminase or LDH elevation    Type II diabetes mellitus ([place])    Ventral hernia s/p laparoscopic lysis of adhesions and hernia repair with mesh 07/29/11 08/01/2011    Past Surgical History:  Procedure Laterality Date   ABDOMINAL HYSTERECTOMY  1980's   partial   BILATERAL OOPHORECTOMY  1980's   "after partial hysterectomy"   BREAST BIOPSY Right    benign   BREAST EXCISIONAL BIOPSY Right 03/2006   benign   BREAST SURGERY Right ~ 2010   excision milk duct;   BUNIONECTOMY Bilateral    CATARACT EXTRACTION W/PHACO  10/27/2010   Procedure: CATARACT EXTRACTION PHACO AND INTRAOCULAR LENS PLACEMENT ([place]);  Surgeon: Williams Che;  Location: [facility];  Service: Ophthalmology;  Laterality: Left;  CDE- 1.78   CATARACT EXTRACTION W/PHACO  07/13/2011   Procedure: CATARACT EXTRACTION PHACO AND INTRAOCULAR LENS PLACEMENT ([facility]);  Surgeon: Williams Che, MD;  Location: [facility];  Service: Ophthalmology;  Laterality: Right;  CDE:  1.65    DILATION AND CURETTAGE OF UTERUS  1970; [place]  multiple   FOOT SURGERY Left ~ 2011   "straightened toe & scraped bone below big toe"   FOOT SURGERY Right ~ 2011   "scraped bone of big toe; shortened middle toet"   [place]   "had to have scar tissue removed 6 months after repair"   Inverted nipples  1992   LAPAROSCOPIC CHOLECYSTECTOMY  1980's   LIPOSUCTION  1992   SALPINGOOPHORECTOMY Bilateral    "6 months or so after hysterectomy"   SKIN CANCER EXCISION  1990's   "front side of right shin"   [place]  07/29/2011   Procedure: LAPAROSCOPIC VENTRAL HERNIA;  Surgeon: Adin Hector, MD;  Location: [facility];  Service: General;  Laterality: N/A;  multiple incarcerated hernias with mesh    Social History [name]  reports that she has never smoked. She has never used smokeless tobacco. She reports that she does not drink alcohol and does not use drugs.  family history includes Anesthesia problems in her mother; Arthritis in her mother; Colitis in her mother; Colon cancer in her paternal uncle; Coronary artery disease in her father; Crohn's disease in her mother; Dementia in her maternal uncle; Depression in her maternal uncle; Diabetes in her father and sister; Heart disease in her father; Melanoma in her father; Migraines in her sister; Other (age of onset: 49) in her daughter; Pneumonia (age of onset: 13) in her mother; Skin cancer in her father; Stroke in her father.  Allergies  Allergen Reactions   Gemfibrozil Swelling    REACTION: Angioedema   Latex Other (See Comments)    Blisters where touched or applied   Penicillins Hives    Will spread in patches all over the body. Did it involve swelling of the face/tongue/throat, SOB, or low BP? No Did it involve sudden or severe rash/hives, skin peeling, or any reaction on the inside of your mouth or nose? No Did you need to seek medical attention at a  hospital or doctor's office? Yes When did it last happen?       If all above answers are "NO", may proceed with cephalosporin use.    Adhesive [Tape] Other (See Comments)    Will blister skin where applied - do not use BAND-AIDS.   Codeine Hives    Will spread in patches all over the body.   Digoxin And Related     Makes BP drop   Metformin And Related Diarrhea   Omeprazole Diarrhea   Ace Inhibitors Rash    Cough and rash        PHYSICAL EXAMINATION: Vital signs: BP 98/62   Pulse  94   Ht '5\' 3"'$  (1.6 m)   Wt 161 lb (73 kg)   LMP 06/03/1978   SpO2 98%   BMI 28.52 kg/m   Constitutional: generally well-appearing, no acute distress Psychiatric: alert and oriented x3, cooperative Eyes: extraocular movements intact, anicteric, conjunctiva pink Mouth: oral pharynx moist, no lesions Neck: supple no lymphadenopathy Cardiovascular: heart regular rate and rhythm, no murmur Lungs: clear to auscultation bilaterally Abdomen: soft, nontender, nondistended, no obvious ascites, no peritoneal signs, normal bowel sounds, no organomegaly Rectal: Furred till colonoscopy Extremities: no clubbing, cyanosis, or lower extremity edema bilaterally Skin: no lesions on visible extremities Neuro: No focal deficits.  Cranial nerves intact  ASSESSMENT:  1.  History of multiple adenomatous and sessile serrated polyps February 2019.  Due for follow-up 2.  Left-sided diverticulosis 3.  Internal hemorrhoids 4.  GERD status post fundoplication.  On PPI 5.  Dyspepsia 6.  IBS with alternating bowel habits 7.  Multiple medical problems 8.  On Coumadin for atrial fibrillation 9.  Diabetes on metformin   PLAN:  1.  Schedule colonoscopy.  The patient is HIGH RISK given her comorbidities and the need to address her anticoagulation therapy and diabetic medical therapy.The nature of the procedure, as well as the risks, benefits, and alternatives were carefully and thoroughly reviewed with the patient. Ample  time for discussion and questions allowed. The patient understood, was satisfied, and agreed to proceed. 2.  Would like to hold Coumadin 5 days prior to the procedure.  We will confer with her cardiologist. 3.  Hold metformin the day of the procedure to avoid unwanted hypoglycemia 4.  Citrucel 2 tablespoons daily for alternating bowel habits

## 2020-11-15 ENCOUNTER — Other Ambulatory Visit: Payer: Self-pay

## 2020-11-15 ENCOUNTER — Ambulatory Visit (INDEPENDENT_AMBULATORY_CARE_PROVIDER_SITE_OTHER): Payer: Medicare Other | Admitting: *Deleted

## 2020-11-15 DIAGNOSIS — I4891 Unspecified atrial fibrillation: Secondary | ICD-10-CM | POA: Diagnosis not present

## 2020-11-15 DIAGNOSIS — Z5181 Encounter for therapeutic drug level monitoring: Secondary | ICD-10-CM

## 2020-11-15 LAB — POCT INR: INR: 2.1 (ref 2.0–3.0)

## 2020-11-15 NOTE — Patient Instructions (Signed)
Description   Continue 1 tablet daily, except 1.5 tablets on Wednesday.  INR check 3 weeks. [facility] [phone]. Please call when eye surgery schedule.

## 2020-11-19 ENCOUNTER — Telehealth: Payer: Self-pay

## 2020-11-19 NOTE — Telephone Encounter (Signed)
   [facility] Pre-operative Risk Assessment    Patient Name: [name]  DOB: 1951/02/06 MRN: [id number]  Dr.Michael Frederico [name] requesting [facility]  Request for surgical clearance:  What type of surgery is being performed  eye surgery strabismus repair  When is this surgery scheduled 12/09/20  What type of clearance is required Both  Are there any medications that need to be held prior to surgery and how long   Coumadin  Practice name and name of physician performing surgery  [facility]  What is the office phone number  [phone]   7.   What is the office fax number        [phone]  8.   Anesthesia type Not listed   [name] 11/19/2020, 4:15 PM  _________________________________________________________________   (provider comments below)

## 2020-11-20 ENCOUNTER — Telehealth: Payer: Self-pay

## 2020-11-20 NOTE — Telephone Encounter (Signed)
Called and lmomed the pt to r/s coumadin appt to 9/26 per megan supple's request.   Routing back to her to make her aware.   Megan supple asked, "are you able to call this pt and move up INR check to 9/26? she has a procedure on 10/3 and will be holding her warfarin 5 days prior so she doesn't need an INR check on the date she's currently scheduled for since she'll already be holding."

## 2020-11-20 NOTE — Telephone Encounter (Signed)
Patient with diagnosis of afib on warfarin for anticoagulation.    Procedure: eye surgery strabismus repair Date of procedure: 12/09/20  CHA2DS2-VASc Score = 6  This indicates a 9.7% annual risk of stroke. The patient's score is based upon: CHF History: 0 HTN History: 1 Diabetes History: 1 Stroke History: 2 Vascular Disease History: 0 Age Score: 1 Gender Score: 1   CrCl 47m/min Platelet count 227K  Ok to use Dr Hochrein's prior clearance comment from 07/18/20 note - "I previously calculated her Ms. Kimberly EAnnina Ropphas a CHA2DS2 - VASc score of 2 before.  There is a very questionable history of any prior cerebrovascular event. I think that Lovenox bridging is not indicated."  Pt can hold warfarin for 5 days prior to procedure without Lovenox bridge. She should restart warfarin on the evening of procedure date if possible.

## 2020-11-20 NOTE — Telephone Encounter (Signed)
We are now asked for coumadin hold for eye surgery. Are the recommendations that same?

## 2020-11-25 ENCOUNTER — Telehealth: Payer: Self-pay | Admitting: Internal Medicine

## 2020-11-25 NOTE — Telephone Encounter (Signed)
Inbound call from pt requesting a call back stating that she needed to be sure when she is to stop taking her warafin before her procedure. Please advise. Thank you.

## 2020-11-25 NOTE — Telephone Encounter (Signed)
Called and spoke w/pt and stated that they needed to move appt to 9/26 and they cooperated

## 2020-11-25 NOTE — Telephone Encounter (Signed)
Pt was informed to hold her warfarin three days prior to her Colonoscopy. Pt verbalized understanding

## 2020-11-26 NOTE — Telephone Encounter (Signed)
Coming on board preop today. Called patient on both #s, got VM. LMTCB. See additional requests on clearance for CXR, EKG, CBC. We can provide copy of 08/2020 EKG but otherwise last CXR and CBC have been done by outside locations.

## 2020-12-02 ENCOUNTER — Ambulatory Visit (INDEPENDENT_AMBULATORY_CARE_PROVIDER_SITE_OTHER): Payer: Medicare Other

## 2020-12-02 ENCOUNTER — Other Ambulatory Visit: Payer: Self-pay

## 2020-12-02 DIAGNOSIS — I4891 Unspecified atrial fibrillation: Secondary | ICD-10-CM | POA: Diagnosis not present

## 2020-12-02 DIAGNOSIS — Z5181 Encounter for therapeutic drug level monitoring: Secondary | ICD-10-CM

## 2020-12-02 LAB — POCT INR: INR: 1.9 — AB (ref 2.0–3.0)

## 2020-12-02 NOTE — Patient Instructions (Signed)
Take 1.5 tablets tonight only and then Continue 1 tablet daily, except 1.5 tablets on Wednesday.  INR check 3 weeks. [facility] [phone]/19 eye surgery scheduled.  Hold 5 days prior to 10/19 procedure;  10/14-10/18 No Coumadin;

## 2020-12-09 NOTE — Telephone Encounter (Signed)
   Primary Cardiologist: Minus Breeding, MD  Chart reviewed as part of pre-operative protocol coverage. Given past medical history and time since last visit, based on ACC/AHA guidelines, Aseret Daylah Sayavong would be at acceptable risk for the planned procedure without further cardiovascular testing.   Patient with diagnosis of afib on warfarin for anticoagulation.     Procedure: eye surgery strabismus repair Date of procedure: 12/25/20   CHA2DS2-VASc Score = 6  This indicates a 9.7% annual risk of stroke. The patient's score is based upon: CHF History: 0 HTN History: 1 Diabetes History: 1 Stroke History: 2 Vascular Disease History: 0 Age Score: 1 Gender Score: 1   CrCl 39mL/min Platelet count 227K   Ok to use Dr Hochrein's prior clearance comment from 07/18/20 note - "I previously calculated her Ms. Nyeisha Akeelah Seppala has a CHA2DS2 - VASc score of 2 before.  There is a very questionable history of any prior cerebrovascular event. I think that Lovenox bridging is not indicated."   Pt can hold warfarin for 5 days prior to procedure without Lovenox bridge. She should restart warfarin on the evening of procedure date if possible.  Patient was advised that if she develops new symptoms prior to surgery to contact our office to arrange a follow-up appointment.  She verbalized understanding.  I will route this recommendation to the requesting party via Epic fax function and remove from pre-op pool.  Please call with questions.  Jossie Ng. Sarayu Prevost NP-C    12/09/2020, 3:13 PM [facility] [place] [address] Office [phone] Fax [phone]

## 2020-12-12 NOTE — Telephone Encounter (Signed)
Opened in error

## 2021-01-01 ENCOUNTER — Ambulatory Visit (INDEPENDENT_AMBULATORY_CARE_PROVIDER_SITE_OTHER): Payer: Medicare Other

## 2021-01-01 ENCOUNTER — Other Ambulatory Visit: Payer: Self-pay

## 2021-01-01 DIAGNOSIS — I4891 Unspecified atrial fibrillation: Secondary | ICD-10-CM | POA: Diagnosis not present

## 2021-01-01 DIAGNOSIS — Z5181 Encounter for therapeutic drug level monitoring: Secondary | ICD-10-CM | POA: Diagnosis not present

## 2021-01-01 LAB — POCT INR: INR: 1.3 — AB (ref 2.0–3.0)

## 2021-01-01 NOTE — Patient Instructions (Signed)
Take 3 tablets tonight only and then Continue 1 tablet daily, except 1.5 tablets on Wednesday.  INR check 1 week. [facility] [phone].

## 2021-01-03 ENCOUNTER — Telehealth: Payer: Self-pay | Admitting: Internal Medicine

## 2021-01-03 NOTE — Telephone Encounter (Signed)
Inbound call from pt requesting a call back stating that her Cardiologist is requesting information regarding being off of warafin before procedure. Please advise. Thank you

## 2021-01-03 NOTE — Telephone Encounter (Signed)
Discussed with pt that we received clearance to hold coumadin for 3 days and no lovenox bridge required. Pt aware. Information in note from 11/05/20.

## 2021-01-08 ENCOUNTER — Ambulatory Visit (INDEPENDENT_AMBULATORY_CARE_PROVIDER_SITE_OTHER): Payer: Medicare Other

## 2021-01-08 ENCOUNTER — Other Ambulatory Visit: Payer: Self-pay

## 2021-01-08 DIAGNOSIS — Z5181 Encounter for therapeutic drug level monitoring: Secondary | ICD-10-CM | POA: Diagnosis not present

## 2021-01-08 DIAGNOSIS — I4891 Unspecified atrial fibrillation: Secondary | ICD-10-CM

## 2021-01-08 LAB — POCT INR: INR: 1.7 — AB (ref 2.0–3.0)

## 2021-01-08 NOTE — Patient Instructions (Signed)
Take 2 tablets tonight only and then increase to 1 tablet daily, except 1.5 tablets on Monday, Wednesday and Friday.  INR check 2 weeks. [facility] [phone].  Need to hold Coumadin 11/15-11/17 for 11/18 procedure.

## 2021-01-14 ENCOUNTER — Telehealth: Payer: Self-pay | Admitting: *Deleted

## 2021-01-14 NOTE — Telephone Encounter (Signed)
Pt called and stated that she is going to have eye surgery on 11/16 and her endoscopy/colonoscopy got rescheduled to 12/14.    Pt stated she was instructed to hold her warfarin 5 days prior to procedure. Instructed pt to hold warfarin 11/11-11/15 (clearance note from 11/19/2020). Pt's Inr has been low and her dose recently changed at last appointment. Scheduled pt to come in on Thursday, 11/10  before pt starts holding warfarin to have INR checked.    Rescheduled pt to come in on 11/23 to have INR checked post procedure.

## 2021-01-16 ENCOUNTER — Other Ambulatory Visit: Payer: Self-pay

## 2021-01-16 ENCOUNTER — Ambulatory Visit (INDEPENDENT_AMBULATORY_CARE_PROVIDER_SITE_OTHER): Payer: Medicare Other

## 2021-01-16 DIAGNOSIS — Z5181 Encounter for therapeutic drug level monitoring: Secondary | ICD-10-CM

## 2021-01-16 DIAGNOSIS — I4891 Unspecified atrial fibrillation: Secondary | ICD-10-CM

## 2021-01-16 LAB — POCT INR: INR: 1.7 — AB (ref 2.0–3.0)

## 2021-01-16 NOTE — Patient Instructions (Signed)
increase to 1.5 tablets daily, except 1 tablet on Monday, Wednesday and Friday.  INR check 2 weeks. [facility] [phone].  Need to hold Coumadin 11/11-11/15 for 11/16 eye procedure.

## 2021-01-23 ENCOUNTER — Other Ambulatory Visit: Payer: Self-pay | Admitting: Cardiology

## 2021-01-24 ENCOUNTER — Encounter: Payer: Medicare Other | Admitting: Internal Medicine

## 2021-01-29 ENCOUNTER — Ambulatory Visit (INDEPENDENT_AMBULATORY_CARE_PROVIDER_SITE_OTHER): Payer: Medicare Other

## 2021-01-29 ENCOUNTER — Other Ambulatory Visit: Payer: Self-pay

## 2021-01-29 DIAGNOSIS — Z5181 Encounter for therapeutic drug level monitoring: Secondary | ICD-10-CM

## 2021-01-29 DIAGNOSIS — I4891 Unspecified atrial fibrillation: Secondary | ICD-10-CM

## 2021-01-29 LAB — POCT INR: INR: 1.6 — AB (ref 2.0–3.0)

## 2021-01-29 NOTE — Patient Instructions (Signed)
Take 2 tablets tonight and Thursday night and then increase to 1.5 tablets daily, except 1 tablet on Monday and Friday.  INR check 2 weeks. [facility] [phone].  Colonoscopy 12/14.

## 2021-02-11 ENCOUNTER — Telehealth: Payer: Self-pay | Admitting: Pharmacist Clinician (PhC)/ Clinical Pharmacy Specialist

## 2021-02-11 ENCOUNTER — Ambulatory Visit (INDEPENDENT_AMBULATORY_CARE_PROVIDER_SITE_OTHER): Payer: Medicare Other | Admitting: *Deleted

## 2021-02-11 ENCOUNTER — Other Ambulatory Visit: Payer: Self-pay

## 2021-02-11 DIAGNOSIS — Z5181 Encounter for therapeutic drug level monitoring: Secondary | ICD-10-CM

## 2021-02-11 DIAGNOSIS — I4891 Unspecified atrial fibrillation: Secondary | ICD-10-CM | POA: Diagnosis not present

## 2021-02-11 LAB — POCT INR: INR: 1.9 — AB (ref 2.0–3.0)

## 2021-02-11 NOTE — Patient Instructions (Addendum)
Description   -Take 2 tablets of warfarin today.  -Then start taking warfarin 1.5 tablets daily except for 1 tablet on Mondays. Recheck INR 1 week post procedure.  -Hold warfarin 12/9-12/13.  -Resume warfarin on 12/14 the evening of the procedure unless the Dr says otherwise.

## 2021-02-11 NOTE — Telephone Encounter (Signed)
Patient in office today for INR check, states having colonoscopy/endoscopy on Dec 14 with Dr. Scarlette Shorts at [facility]  Patient with diagnosis of atrial fibrillation on warfarin for anticoagulation.    Procedure: colonoscopy/endoscopy Date of procedure: 02/19/21   CHA2DS2-VASc Score = 6   This indicates a 9.7% annual risk of stroke. The patient's score is based upon: CHF History: 0 HTN History: 1 Diabetes History: 1 Stroke History: 2 Vascular Disease History: 0 Age Score: 1 Gender Score: 1  CrCl 50 (with adjusted body weight) Platelet count 216  Per clearance in September: Ok to use Dr Hochrein's prior clearance comment from 07/18/20 note - "I previously calculated her [name] has a CHA2DS2 - VASc score of 2 before.  There is a very questionable history of any prior cerebrovascular event. I think that Lovenox bridging is not indicated."  Per office protocol, patient can hold warfarin for 5 days prior to procedure.   Patient will not need bridging with Lovenox (enoxaparin) around procedure.  Patient was given instructions for warfarin hold, this is for documentation purposes.

## 2021-02-19 ENCOUNTER — Other Ambulatory Visit: Payer: Self-pay

## 2021-02-19 ENCOUNTER — Encounter: Payer: Self-pay | Admitting: Internal Medicine

## 2021-02-19 ENCOUNTER — Ambulatory Visit (AMBULATORY_SURGERY_CENTER): Payer: Medicare Other | Admitting: Internal Medicine

## 2021-02-19 VITALS — BP 111/58 | HR 101 | Temp 98.0°F | Resp 16 | Ht 63.0 in | Wt 161.0 lb

## 2021-02-19 DIAGNOSIS — R1319 Other dysphagia: Secondary | ICD-10-CM

## 2021-02-19 DIAGNOSIS — R1013 Epigastric pain: Secondary | ICD-10-CM

## 2021-02-19 DIAGNOSIS — K219 Gastro-esophageal reflux disease without esophagitis: Secondary | ICD-10-CM | POA: Diagnosis not present

## 2021-02-19 DIAGNOSIS — D122 Benign neoplasm of ascending colon: Secondary | ICD-10-CM

## 2021-02-19 DIAGNOSIS — Z860100 Personal history of colon polyps, unspecified: Secondary | ICD-10-CM

## 2021-02-19 DIAGNOSIS — D124 Benign neoplasm of descending colon: Secondary | ICD-10-CM | POA: Diagnosis not present

## 2021-02-19 DIAGNOSIS — K582 Mixed irritable bowel syndrome: Secondary | ICD-10-CM

## 2021-02-19 MED ORDER — SODIUM CHLORIDE 0.9 % IV SOLN: 500.0000 mL | Freq: Once | INTRAVENOUS | Status: DC

## 2021-02-19 NOTE — Progress Notes (Signed)
HISTORY OF PRESENT ILLNESS:  [name] is a 70 y.o. female who was evaluated in the office November 05, 2020 regarding surveillance colonoscopy, GERD, and dyspepsia.  See that dictation for details.  No clinically significant interval change.  She is now for colonoscopy and upper endoscopy.  REVIEW OF SYSTEMS:  All non-GI ROS negative   Past Medical History:  Diagnosis Date   Abnormal CT scan 03/16/2017   Atrial fibrillation (HCC)    Bunion    Carotid stenosis    Carpal tunnel syndrome    mild    Cervical arthritis    Cholecystitis    s/p cholecystectomy   Chronic back pain    Chronic gastritis    Depression    Diabetic peripheral neuropathy ([place])    Diarrhea 04/05/2015   Digoxin toxicity    08/2011   Diverticulitis    dx 04/08/14   Diverticulosis    Dysphagia    no documented strictures but responded positively to dilation in past.    Epileptic seizure, tonic ([place])    .No meds since age of 76.   Family history of adverse reaction to anesthesia    "my mother may have"   Fatty liver    Fibromyalgia    GERD (gastroesophageal reflux disease)    H/O hiatal hernia    S/P hernia repair   Hyperlipidemia    Hypertension    Hypothyroid    Internal hemorrhoid    Joint pain 05/08/2011   Left knee pain 12/19/2014   Migraine    Mild cognitive impairment    Muscle spasm of back 08/10/2013   Nonspecific (abnormal) findings on radiological and other examination of gastrointestinal tract 08/07/2011   OSA (obstructive sleep apnea) dx'd 2003   "can't sleep w/that mask; lost some weight; maybe that's helped" (04/26/2014)   Osteoarthritis    Pneumonia X 2   Right foot pain 08/03/2013   Sarcoidosis    Seizures ([place])    as a child, they stopped after age 45   Skin neoplasm    TIA (transient ischemic attack)    "don't know when I had it" (04/26/2014)   Transaminase or LDH elevation    Type II diabetes mellitus ([place])    Ventral hernia s/p laparoscopic lysis of adhesions and hernia  repair with mesh 07/29/11 08/01/2011    Past Surgical History:  Procedure Laterality Date   ABDOMINAL HYSTERECTOMY  1980's   partial   BILATERAL OOPHORECTOMY  1980's   "after partial hysterectomy"   BREAST BIOPSY Right    benign   BREAST EXCISIONAL BIOPSY Right 03/2006   benign   BREAST SURGERY Right ~ 2010   excision milk duct;   BUNIONECTOMY Bilateral    CATARACT EXTRACTION W/PHACO  10/27/2010   Procedure: CATARACT EXTRACTION PHACO AND INTRAOCULAR LENS PLACEMENT ([place]);  Surgeon: Williams Che;  Location: [facility];  Service: Ophthalmology;  Laterality: Left;  CDE- 1.78   CATARACT EXTRACTION W/PHACO  07/13/2011   Procedure: CATARACT EXTRACTION PHACO AND INTRAOCULAR LENS PLACEMENT ([facility]);  Surgeon: Williams Che, MD;  Location: [facility];  Service: Ophthalmology;  Laterality: Right;  CDE:  1.65   DILATION AND CURETTAGE OF UTERUS  1970; [place]  multiple   FOOT SURGERY Left ~ 2011   "straightened toe & scraped bone below big toe"   FOOT SURGERY Right ~ 2011   "scraped bone of big toe; shortened middle toet"   [place]   "had to have  scar tissue removed 6 months after repair"   Inverted nipples  1992   LAPAROSCOPIC CHOLECYSTECTOMY  1980's   LIPOSUCTION  1992   SALPINGOOPHORECTOMY Bilateral    "6 months or so after hysterectomy"   SKIN CANCER EXCISION  1990's   "front side of right shin"   [place]  07/29/2011   Procedure: LAPAROSCOPIC VENTRAL HERNIA;  Surgeon: Adin Hector, MD;  Location: [facility];  Service: General;  Laterality: N/A;  multiple incarcerated hernias with mesh    Social History Laretha Khali Perella  reports that she has never smoked. She has never used smokeless tobacco. She reports that she does not drink alcohol and does not use drugs.  family history includes Anesthesia problems in her mother; Arthritis in her mother; Colitis in her mother; Colon cancer in her paternal uncle; Coronary artery  disease in her father; Crohn's disease in her mother; Dementia in her maternal uncle; Depression in her maternal uncle; Diabetes in her father and sister; Heart disease in her father; Melanoma in her father; Migraines in her sister; Other (age of onset: 92) in her daughter; Pneumonia (age of onset: 25) in her mother; Skin cancer in her father; Stroke in her father.  Allergies  Allergen Reactions   Gemfibrozil Swelling    REACTION: Angioedema   Latex Other (See Comments)    Blisters where touched or applied   Penicillins Hives    Will spread in patches all over the body. Did it involve swelling of the face/tongue/throat, SOB, or low BP? No Did it involve sudden or severe rash/hives, skin peeling, or any reaction on the inside of your mouth or nose? No Did you need to seek medical attention at a hospital or doctor's office? Yes When did it last happen?       If all above answers are NO, may proceed with cephalosporin use.    Adhesive [Tape] Other (See Comments)    Will blister skin where applied - do not use BAND-AIDS.   Codeine Hives    Will spread in patches all over the body.   Digoxin And Related     Makes BP drop   Metformin And Related Diarrhea   Omeprazole Diarrhea   Ace Inhibitors Rash    Cough and rash        PHYSICAL EXAMINATION:  Vital signs: BP 136/89    Pulse (!) 112    Temp 98 F (36.7 C)    Ht 5\' 3"  (1.6 m)    Wt 161 lb (73 kg)    LMP 06/03/1978    SpO2 97%    BMI 28.52 kg/m  General: Well-developed, well-nourished, no acute distress HEENT: Sclerae are anicteric, conjunctiva pink. Oral mucosa intact Lungs: Clear Heart: Regular Abdomen: soft, nontender, nondistended, no obvious ascites, no peritoneal signs, normal bowel sounds. No organomegaly. Extremities: No edema Psychiatric: alert and oriented x3. Cooperative     ASSESSMENT:   1.  History of multiple adenomatous and sessile serrated polyps February 2019.  Due for follow-up 2.  Left-sided  diverticulosis 3.  Internal hemorrhoids 4.  GERD status post fundoplication.  On PPI 5.  Dyspepsia 6.  IBS with alternating bowel habits 7.  Multiple medical problems 8.  On Coumadin for atrial fibrillation 9.  Diabetes on metformin     PLAN:   1.  Schedule colonoscopy.  The patient is HIGH RISK given her comorbidities and the need to address her anticoagulation therapy and diabetic medical therapy.The  nature of the procedure, as well as the risks, benefits, and alternatives were carefully and thoroughly reviewed with the patient. Ample time for discussion and questions allowed. The patient understood, was satisfied, and agreed to proceed. 2.  Would like to hold Coumadin 5 days prior to the procedure.  We will confer with her cardiologist. 3.  Upper endoscopy to evaluate chronic GERD and dyspepsia 4.  Hold metformin the day of the procedure to avoid unwanted hypoglycemia 5.  Citrucel 2 tablespoons daily for alternating bowel habits

## 2021-02-19 NOTE — Patient Instructions (Signed)
2 polyps removed- await pathology  Resume Coumadin today  Please read over handouts about polyps and GERD  Continue your normal medications Next colonoscopy- 5 years   YOU HAD AN ENDOSCOPIC PROCEDURE TODAY AT [facility]:   Refer to the procedure report that was given to you for any specific questions about what was found during the examination.  If the procedure report does not answer your questions, please call your gastroenterologist to clarify.  If you requested that your care partner not be given the details of your procedure findings, then the procedure report has been included in a sealed envelope for you to review at your convenience later.  YOU SHOULD EXPECT: Some feelings of bloating in the abdomen. Passage of more gas than usual.  Walking can help get rid of the air that was put into your GI tract during the procedure and reduce the bloating. If you had a lower endoscopy (such as a colonoscopy or flexible sigmoidoscopy) you may notice spotting of blood in your stool or on the toilet paper. If you underwent a bowel prep for your procedure, you may not have a normal bowel movement for a few days.  Please Note:  You might notice some irritation and congestion in your nose or some drainage.  This is from the oxygen used during your procedure.  There is no need for concern and it should clear up in a day or so.  SYMPTOMS TO REPORT IMMEDIATELY:  Following lower endoscopy (colonoscopy or flexible sigmoidoscopy):  Excessive amounts of blood in the stool  Significant tenderness or worsening of abdominal pains  Swelling of the abdomen that is new, acute  Fever of 100F or higher  Following upper endoscopy (EGD)  Vomiting of blood or coffee ground material  New chest pain or pain under the shoulder blades  Painful or persistently difficult swallowing  New shortness of breath  Fever of 100F or higher  Black, tarry-looking stools  For urgent or emergent issues, a  gastroenterologist can be reached at any hour by calling [phone]. Do not use MyChart messaging for urgent concerns.    DIET:  We do recommend a small meal at first, but then you may proceed to your regular diet.  Drink plenty of fluids but you should avoid alcoholic beverages for 24 hours.  ACTIVITY:  You should plan to take it easy for the rest of today and you should NOT DRIVE or use heavy machinery until tomorrow (because of the sedation medicines used during the test).    FOLLOW UP: Our staff will call the number listed on your records 48-72 hours following your procedure to check on you and address any questions or concerns that you may have regarding the information given to you following your procedure. If we do not reach you, we will leave a message.  We will attempt to reach you two times.  During this call, we will ask if you have developed any symptoms of COVID 19. If you develop any symptoms (ie: fever, flu-like symptoms, shortness of breath, cough etc.) before then, please call [phone].  If you test positive for Covid 19 in the 2 weeks post procedure, please call and report this information to [place].    If any biopsies were taken you will be contacted by phone or by letter within the next 1-3 weeks.  Please call us at [phone] if you have not heard about the biopsies in 3 weeks.    SIGNATURES/CONFIDENTIALITY: You and/or your  care partner have signed paperwork which will be entered into your electronic medical record.  These signatures attest to the fact that that the information above on your After Visit Summary has been reviewed and is understood.  Full responsibility of the confidentiality of this discharge information lies with you and/or your care-partner.

## 2021-02-19 NOTE — Progress Notes (Signed)
Sedate, gd SR, tolerated procedure well, VSS, report to RN 

## 2021-02-19 NOTE — Progress Notes (Signed)
Pt had no c/o discomfort, pain in the RR

## 2021-02-19 NOTE — Op Note (Signed)
[facility] Patient Name: [name] Procedure Date: 02/19/2021 3:00 PM MRN: [id number] Endoscopist: Docia Chuck. Henrene Pastor , MD Age: 70 Referring MD:  Date of Birth: 05-03-1950 Gender: Female Account #: [id number] Procedure:                Colonoscopy with cold snare polypectomy x 2 Indications:              High risk colon cancer surveillance: Personal                            history of multiple (3 or more) adenomas, High risk                            colon cancer surveillance: Personal history of                            sessile serrated colon polyp (less than 10 mm in                            size) with no dysplasia. Previous examination 2019 Medicines:                Monitored Anesthesia Care Procedure:                Pre-Anesthesia Assessment:                           - Prior to the procedure, a History and Physical                            was performed, and patient medications and                            allergies were reviewed. The patient's tolerance of                            previous anesthesia was also reviewed. The risks                            and benefits of the procedure and the sedation                            options and risks were discussed with the patient.                            All questions were answered, and informed consent                            was obtained. Prior Anticoagulants: The patient has                            taken Coumadin (warfarin), last dose was 5 days                            prior to procedure. ASA Grade Assessment: III - A  patient with severe systemic disease. After                            reviewing the risks and benefits, the patient was                            deemed in satisfactory condition to undergo the                            procedure.                           After obtaining informed consent, the colonoscope                            was passed under  direct vision. Throughout the                            procedure, the patient's blood pressure, pulse, and                            oxygen saturations were monitored continuously. The                            CF [id number] #[id number] was introduced through the anus                            and advanced to the the cecum, identified by                            appendiceal orifice and ileocecal valve. The                            ileocecal valve, appendiceal orifice, and rectum                            were photographed. The quality of the bowel                            preparation was excellent. The colonoscopy was                            performed without difficulty. The patient tolerated                            the procedure well. The bowel preparation used was                            SUPREP via split dose instruction. Scope In: 3:21:07 PM Scope Out: 3:39:46 PM Scope Withdrawal Time: 0 hours 11 minutes 36 seconds  Total Procedure Duration: 0 hours 18 minutes 39 seconds  Findings:                 Two polyps were found in the descending colon and  ascending colon. The polyps were 3 to 4 mm in size.                           Multiple small and large-mouthed diverticula were                            found in the left colon and right colon.                            Significant sigmoid stenosis due to diverticular                            disease.                           The exam was otherwise without abnormality on                            direct and retroflexion views. Complications:            No immediate complications. Estimated blood loss:                            None. Estimated Blood Loss:     Estimated blood loss: none. Impression:               - Two 3 to 4 mm polyps in the descending colon and                            in the ascending colon.                           - Diverticulosis in the left colon and in the right                             colon. SIGMOID STENOSIS                           - The examination was otherwise normal on direct                            and retroflexion views.                           - No specimens collected. Recommendation:           - Repeat colonoscopy for surveillance could be                            considered in 5 years based on overall health,                            current guidelines, and motivation. PEDIATRIC SCOPE                           - Resume Coumadin (warfarin) today at prior dose.                           -  Patient has a contact number available for                            emergencies. The signs and symptoms of potential                            delayed complications were discussed with the                            patient. Return to normal activities tomorrow.                            Written discharge instructions were provided to the                            patient.                           - Resume previous diet.                           - Continue present medications.                           - Await pathology results. Docia Chuck. Henrene Pastor, MD 02/19/2021 3:50:52 PM This report has been signed electronically.

## 2021-02-19 NOTE — Op Note (Signed)
[facility] Patient Name: [name] Procedure Date: 02/19/2021 3:00 PM MRN: [id number] Endoscopist: Docia Chuck. Henrene Pastor , MD Age: 70 Referring MD:  Date of Birth: 08/25/1950 Gender: Female Account #: [id number] Procedure:                Upper GI endoscopy Indications:              Dyspepsia, Dysphagia, Esophageal reflux. Prior                            history of fundoplication Medicines:                Monitored Anesthesia Care Procedure:                Pre-Anesthesia Assessment:                           - Prior to the procedure, a History and Physical                            was performed, and patient medications and                            allergies were reviewed. The patient's tolerance of                            previous anesthesia was also reviewed. The risks                            and benefits of the procedure and the sedation                            options and risks were discussed with the patient.                            All questions were answered, and informed consent                            was obtained. Prior Anticoagulants: The patient has                            taken Coumadin (warfarin), last dose was 5 days                            prior to procedure. ASA Grade Assessment: III - A                            patient with severe systemic disease. After                            reviewing the risks and benefits, the patient was                            deemed in satisfactory condition to undergo the  procedure.                           After obtaining informed consent, the endoscope was                            passed under direct vision. Throughout the                            procedure, the patient's blood pressure, pulse, and                            oxygen saturations were monitored continuously. The                            Olympus [id number] #[id number] was introduced through                             the mouth, and advanced to the second part of                            duodenum. The upper GI endoscopy was accomplished                            without difficulty. The patient tolerated the                            procedure well. Scope In: Scope Out: Findings:                 The esophagus was normal.                           The stomach was normal, status post fundoplication                            with intact appearing wrap.                           The examined duodenum was normal.                           The cardia and gastric fundus were normal on                            retroflexion. Complications:            No immediate complications. Estimated Blood Loss:     Estimated blood loss: none. Impression:               1. Status post fundoplication. Intact wrap. No                            abnormalities                           2. GERD Recommendation:           - Patient has  a contact number available for                            emergencies. The signs and symptoms of potential                            delayed complications were discussed with the                            patient. Return to normal activities tomorrow.                            Written discharge instructions were provided to the                            patient.                           - Resume previous diet.                           - Continue present medications. Docia Chuck. Henrene Pastor, MD 02/19/2021 3:58:34 PM This report has been signed electronically.

## 2021-02-19 NOTE — Progress Notes (Signed)
1522 Pt in AF with RVR, 125-135, Dr Henrene Pastor aware, Esmolol 10 mg given

## 2021-02-19 NOTE — Progress Notes (Signed)
VS by CW  Pt's states no medical or surgical changes since previsit or office visit.  

## 2021-02-19 NOTE — Progress Notes (Signed)
Called to room to assist during endoscopic procedure.  Patient ID and intended procedure confirmed with present staff. Received instructions for my participation in the procedure from the performing physician.  

## 2021-02-21 ENCOUNTER — Telehealth: Payer: Self-pay

## 2021-02-21 NOTE — Telephone Encounter (Signed)
°  Follow up Call-  Call back number 02/19/2021  Post procedure Call Back phone  # [phone]  Permission to leave phone message Yes  Some recent data might be hidden     Patient questions:  Do you have a fever, pain , or abdominal swelling? No. Pain Score  0 *  Have you tolerated food without any problems? Yes.    Have you been able to return to your normal activities? Yes.    Do you have any questions about your discharge instructions: Diet   No. Medications  No. Follow up visit  No.  Do you have questions or concerns about your Care? No.  Actions: * If pain score is 4 or above: No action needed, pain <4.  Have you developed a fever since your procedure? no  2.   Have you had an respiratory symptoms (SOB or cough) since your procedure? no  3.   Have you tested positive for COVID 19 since your procedure no  4.   Have you had any family members/close contacts diagnosed with the COVID 19 since your procedure?  no   If yes to any of these questions please route to Joylene John, RN and Joella Prince, RN

## 2021-02-24 ENCOUNTER — Encounter: Payer: Self-pay | Admitting: Internal Medicine

## 2021-02-26 ENCOUNTER — Other Ambulatory Visit: Payer: Self-pay

## 2021-02-26 ENCOUNTER — Ambulatory Visit (INDEPENDENT_AMBULATORY_CARE_PROVIDER_SITE_OTHER): Payer: Medicare Other

## 2021-02-26 DIAGNOSIS — I4891 Unspecified atrial fibrillation: Secondary | ICD-10-CM

## 2021-02-26 DIAGNOSIS — Z5181 Encounter for therapeutic drug level monitoring: Secondary | ICD-10-CM

## 2021-02-26 LAB — POCT INR: INR: 1.9 — AB (ref 2.0–3.0)

## 2021-02-26 NOTE — Patient Instructions (Signed)
-  Take 2 tablets of warfarin today.  -Then continue taking warfarin 1.5 tablets daily except for 1 tablet on Mondays. Recheck INR 3 weeks [phone]

## 2021-03-19 ENCOUNTER — Other Ambulatory Visit: Payer: Self-pay

## 2021-03-19 ENCOUNTER — Ambulatory Visit (INDEPENDENT_AMBULATORY_CARE_PROVIDER_SITE_OTHER): Payer: Medicare Other

## 2021-03-19 DIAGNOSIS — Z5181 Encounter for therapeutic drug level monitoring: Secondary | ICD-10-CM | POA: Diagnosis not present

## 2021-03-19 DIAGNOSIS — I4891 Unspecified atrial fibrillation: Secondary | ICD-10-CM | POA: Diagnosis not present

## 2021-03-19 LAB — POCT INR: INR: 2.3 (ref 2.0–3.0)

## 2021-03-19 NOTE — Patient Instructions (Signed)
continue taking warfarin 1.5 tablets daily except for 1 tablet on Mondays. Recheck INR 6 weeks [phone];    

## 2021-03-31 ENCOUNTER — Telehealth: Payer: Self-pay | Admitting: Internal Medicine

## 2021-03-31 NOTE — Telephone Encounter (Signed)
Let her know that I reviewed her colonoscopy report.  She does have very small internal hemorrhoids (she would not see or feel these) which are the likely source.  Have her take 2 tablespoons of Citrucel daily.  These issues should resolve with time

## 2021-03-31 NOTE — Telephone Encounter (Signed)
Pt had colon done 12/14 and she states all was good. For 2 weeks now she has seen blood when she wipes following large loose stools. States that they increased her coumadin about 2 weeks ago also. She is taking 2.5mg  on Monday and 3.75 on all other days. Pt reports she saw a lot of BRB on the tissue when she wiped. She does not think she has hemorrhoids. Please advise.

## 2021-03-31 NOTE — Telephone Encounter (Signed)
Spoke with pt and she is aware of Dr. Perry's recommendations. 

## 2021-03-31 NOTE — Telephone Encounter (Signed)
Patient called and stated that for the last 2 weeks off and on she has had some blood in stool and more so when wiping. Stated that she had a colon procedure in December. Seeking advice, please advise.

## 2021-04-30 ENCOUNTER — Ambulatory Visit: Payer: Medicare Other

## 2021-04-30 ENCOUNTER — Other Ambulatory Visit: Payer: Self-pay

## 2021-04-30 DIAGNOSIS — I4891 Unspecified atrial fibrillation: Secondary | ICD-10-CM

## 2021-04-30 DIAGNOSIS — Z5181 Encounter for therapeutic drug level monitoring: Secondary | ICD-10-CM

## 2021-04-30 LAB — POCT INR: INR: 2.7 (ref 2.0–3.0)

## 2021-04-30 NOTE — Patient Instructions (Signed)
continue taking warfarin 1.5 tablets daily except for 1 tablet on Mondays. Recheck INR 6 weeks [phone];    

## 2021-05-11 ENCOUNTER — Other Ambulatory Visit: Payer: Self-pay | Admitting: Cardiology

## 2021-06-11 ENCOUNTER — Ambulatory Visit: Payer: Medicare Other

## 2021-06-11 DIAGNOSIS — I4891 Unspecified atrial fibrillation: Secondary | ICD-10-CM

## 2021-06-11 DIAGNOSIS — Z5181 Encounter for therapeutic drug level monitoring: Secondary | ICD-10-CM | POA: Diagnosis not present

## 2021-06-11 LAB — POCT INR: INR: 3.1 — AB (ref 2.0–3.0)

## 2021-06-11 NOTE — Patient Instructions (Signed)
continue taking warfarin 1.5 tablets daily except for 1 tablet on Mondays. Recheck INR 6 weeks [phone];  Eat greens tonight; ? ?

## 2021-07-25 ENCOUNTER — Ambulatory Visit: Payer: Medicare Other

## 2021-07-25 DIAGNOSIS — I4891 Unspecified atrial fibrillation: Secondary | ICD-10-CM

## 2021-07-25 DIAGNOSIS — Z5181 Encounter for therapeutic drug level monitoring: Secondary | ICD-10-CM

## 2021-07-25 LAB — POCT INR: INR: 2.6 (ref 2.0–3.0)

## 2021-07-25 NOTE — Patient Instructions (Signed)
continue taking warfarin 1.5 tablets daily except for 1 tablet on Mondays. Recheck INR 6 weeks [phone];    

## 2021-08-21 ENCOUNTER — Other Ambulatory Visit: Payer: Self-pay | Admitting: Cardiology

## 2021-08-22 NOTE — Telephone Encounter (Signed)
Prescription refill request received for warfarin [place]: 08/23/2020 Next INR check: 6/30 Warfarin tablet strength: 2.'5mg'$ 

## 2021-09-05 ENCOUNTER — Ambulatory Visit: Payer: Medicare Other

## 2021-09-05 DIAGNOSIS — Z5181 Encounter for therapeutic drug level monitoring: Secondary | ICD-10-CM

## 2021-09-05 DIAGNOSIS — I4891 Unspecified atrial fibrillation: Secondary | ICD-10-CM | POA: Diagnosis not present

## 2021-09-05 LAB — POCT INR: INR: 2.4 (ref 2.0–3.0)

## 2021-09-05 NOTE — Patient Instructions (Signed)
continue taking warfarin 1.5 tablets daily except for 1 tablet on Mondays. Recheck INR 6 weeks [phone];

## 2021-10-03 ENCOUNTER — Other Ambulatory Visit: Payer: Self-pay | Admitting: Cardiology

## 2021-10-03 DIAGNOSIS — I482 Chronic atrial fibrillation, unspecified: Secondary | ICD-10-CM

## 2021-10-17 ENCOUNTER — Ambulatory Visit: Payer: Medicare Other

## 2021-10-17 DIAGNOSIS — Z5181 Encounter for therapeutic drug level monitoring: Secondary | ICD-10-CM | POA: Diagnosis not present

## 2021-10-17 DIAGNOSIS — I4891 Unspecified atrial fibrillation: Secondary | ICD-10-CM

## 2021-10-17 LAB — POCT INR: INR: 2.4 (ref 2.0–3.0)

## 2021-10-17 NOTE — Patient Instructions (Signed)
continue taking warfarin 1.5 tablets daily except for 1 tablet on Mondays. Recheck INR 6 weeks [phone];

## 2021-10-30 NOTE — Progress Notes (Signed)
Cardiology Office Note   Date:  10/31/2021   ID:  Kimberly, [name] 1951-01-28, MRN [id number]  PCP:  No primary care provider on file.  Cardiologist:   Minus Breeding, MD   Chief Complaint  Patient presents with   Palpitations     History of Present Illness: [name] is a 71 y.o. female who presents for follow-up of atrial fibrillation. She has permanent atrial fibrillation.  Since I last saw her she has no new cardiovascular complaints.  She will occasionally feel some palpitations with her atrial fibrillation but this is sporadic.  For the most part she does well.  She gets some lightheadedness when she stands up quickly.  She is not having any new chest pressure, neck or arm discomfort.  She is not having any weight gain or edema.  She lost her daughter a few years ago to spindle cell carcinoma of the heart.    Past Medical History:  Diagnosis Date   Abnormal CT scan 03/16/2017   Atrial fibrillation (HCC)    Bunion    Carotid stenosis    Carpal tunnel syndrome    mild    Cervical arthritis    Cholecystitis    s/p cholecystectomy   Chronic back pain    Chronic gastritis    Depression    Diabetic peripheral neuropathy ([place])    Diarrhea 04/05/2015   Digoxin toxicity    08/2011   Diverticulitis    dx 04/08/14   Diverticulosis    Dysphagia    no documented strictures but responded positively to dilation in past.    Epileptic seizure, tonic ([place])    .No meds since age of 16.   Family history of adverse reaction to anesthesia    "my mother may have"   Fatty liver    Fibromyalgia    GERD (gastroesophageal reflux disease)    H/O hiatal hernia    S/P hernia repair   Hyperlipidemia    Hypertension    Hypothyroid    Internal hemorrhoid    Joint pain 05/08/2011   Left knee pain 12/19/2014   Migraine    Mild cognitive impairment    Muscle spasm of back 08/10/2013   Nonspecific (abnormal) findings on radiological and other examination of  gastrointestinal tract 08/07/2011   OSA (obstructive sleep apnea) dx'd 2003   "can't sleep w/that mask; lost some weight; maybe that's helped" (04/26/2014)   Osteoarthritis    Pneumonia X 2   Right foot pain 08/03/2013   Sarcoidosis    Seizures ([place])    as a child, they stopped after age 24   Skin neoplasm    TIA (transient ischemic attack)    "don't know when I had it" (04/26/2014)   Transaminase or LDH elevation    Type II diabetes mellitus ([place])    Ventral hernia s/p laparoscopic lysis of adhesions and hernia repair with mesh 07/29/11 08/01/2011    Past Surgical History:  Procedure Laterality Date   ABDOMINAL HYSTERECTOMY  1980's   partial   BILATERAL OOPHORECTOMY  1980's   "after partial hysterectomy"   BREAST BIOPSY Right    benign   BREAST EXCISIONAL BIOPSY Right 03/2006   benign   BREAST SURGERY Right ~ 2010   excision milk duct;   BUNIONECTOMY Bilateral    CATARACT EXTRACTION W/PHACO  10/27/2010   Procedure: CATARACT EXTRACTION PHACO AND INTRAOCULAR LENS PLACEMENT ([place]);  Surgeon: Williams Che;  Location: [facility];  Service: Ophthalmology;  Laterality: Left;  CDE- 1.78   CATARACT EXTRACTION W/PHACO  07/13/2011   Procedure: CATARACT EXTRACTION PHACO AND INTRAOCULAR LENS PLACEMENT ([facility]);  Surgeon: Williams Che, MD;  Location: [facility];  Service: Ophthalmology;  Laterality: Right;  CDE:  1.65   DILATION AND CURETTAGE OF UTERUS  1970; [place]  multiple   EYE MUSCLE SURGERY Bilateral 2022   FOOT SURGERY Left ~ 2011   "straightened toe & scraped bone below big toe"   FOOT SURGERY Right ~ 2011   "scraped bone of big toe; shortened middle toet"   [place]   "had to have scar tissue removed 6 months after repair"   Inverted nipples  1992   LAPAROSCOPIC CHOLECYSTECTOMY  1980's   LIPOSUCTION  1992   SALPINGOOPHORECTOMY Bilateral    "6 months or so after hysterectomy"   SKIN CANCER EXCISION  1990's   "front side of right shin"   [place]  07/29/2011   Procedure: LAPAROSCOPIC VENTRAL HERNIA;  Surgeon: Adin Hector, MD;  Location: [facility];  Service: General;  Laterality: N/A;  multiple incarcerated hernias with mesh     Current Outpatient Medications  Medication Sig Dispense Refill   ACCU-CHEK GUIDE test strip USE 1 STRIP TO CHECK BLOOD SUGAR EVERY DAY     acetaminophen (TYLENOL) 500 MG tablet Take 500 mg by mouth every 6 (six) hours as needed for mild pain or headache.     Alcohol Swabs (ALCOHOL WIPES) 70 % PADS Use 1 swab as needed to clean site for blood sugar check.     atenolol (TENORMIN) 25 MG tablet Take 1.5 tablets (37.5 mg total) by mouth daily. 135 tablet 3   Blood Glucose Calibration (MYGLUCOHEALTH CONTROL) SOLN 1 application by Misc.(Non-Drug; Combo Route) route once a week. Use to check that meter is working properly once weekly.     Blood Glucose Monitoring Suppl (ACCU-CHEK GUIDE ME) w/Device KIT daily.     Blood Glucose Monitoring Suppl (GLUCOCOM BLOOD GLUCOSE MONITOR) DEVI Use meter to check blood sugar once a day.  Please dispense based on availability, insurance coverage, and patient preference.     Cholecalciferol (VITAMIN D3 PO) Take 5,000 Int'l Units by mouth daily.     diltiazem (CARDIZEM CD) 180 MG 24 hr capsule Take 1 capsule (180 mg total) by mouth daily. 90 capsule 3   DULoxetine (CYMBALTA) 60 MG capsule Take 1 capsule (60 mg total) by mouth daily. 90 capsule 3   escitalopram (LEXAPRO) 10 MG tablet Take 10 mg by mouth daily.     glucose blood (PRECISION QID TEST) test strip Use 1 strip to check blood sugar once a day. Please dispense based on availability, insurance coverage, and patient preference.     Lancet Devices (SIMPLE DIAGNOSTICS LANCING DEV) MISC Use to lance skin to check blood sugar levels once a day.  Please dispense based on availability, insurance coverage, and patient preference.     levothyroxine (SYNTHROID) 50 MCG tablet Take 1 tablet (50 mcg  total) by mouth every morning. 90 tablet 3   losartan (COZAAR) 25 MG tablet Take 1 tablet (25 mg total) by mouth daily. 90 tablet 3   meloxicam (MOBIC) 7.5 MG tablet Take 7.5 mg by mouth daily.     metFORMIN (GLUCOPHAGE-XR) 500 MG 24 hr tablet TAKE 2 TABLETS(1000 MG) BY MOUTH TWICE DAILY WITH FOOD 360 tablet 1   Multiple Vitamin (MULTIVITAMIN) tablet Take 1 tablet by mouth daily.  rosuvastatin (CRESTOR) 20 MG tablet Take 1 tablet (20 mg total) by mouth daily. 90 tablet 3   vitamin B-12 (CYANOCOBALAMIN) 500 MCG tablet Take 500 mcg by mouth daily.     warfarin (COUMADIN) 2.5 MG tablet TAKE 1 TO 1 AND 1/2 TABLETS BY MOUTH DAILY AS DIRECTED BY COUMADIN CLINIC 45 tablet 1   No current facility-administered medications for this visit.    Allergies:   Gemfibrozil, Latex, Penicillins, Adhesive [tape], Codeine, Digoxin and related, Metformin and related, Omeprazole, and Ace inhibitors    ROS:  Please see the history of present illness.   Otherwise, review of systems are positive for none.   All other systems are reviewed and negative.    PHYSICAL EXAM: VS:  BP (!) 141/86   Pulse 88   Ht 5' 3"  (1.6 m)   Wt 164 lb 6.4 oz (74.6 kg)   LMP 06/03/1978   SpO2 96%   BMI 29.12 kg/m  , BMI Body mass index is 29.12 kg/m. GENERAL:  Well appearing NECK:  No jugular venous distention, waveform within normal limits, carotid upstroke brisk and symmetric, no bruits, no thyromegaly LUNGS:  Clear to auscultation bilaterally CHEST:  Unremarkable HEART:  PMI not displaced or sustained,S1 and S2 within normal limits, no S3, no clicks, no rubs, no murmurs, irregular ABD:  Flat, positive bowel sounds normal in frequency in pitch, no bruits, no rebound, no guarding, no midline pulsatile mass, no hepatomegaly, no splenomegaly EXT:  2 plus pulses throughout, no edema, no cyanosis no clubbing   EKG:  EKG is  ordered today. The ekg ordered today demonstrates atrial fibrillation, rate 88, interventricular  conduction delay, no acute ST-T wave changes.   Recent Labs: No results found for requested labs within last 365 days.    Lipid Panel    Component Value Date/Time   CHOL 102 10/09/2019 1517   TRIG 205 (H) 10/09/2019 1517   HDL 43 10/09/2019 1517   CHOLHDL 2.4 10/09/2019 1517   CHOLHDL 2.9 04/19/2014 1603   VLDL 36 04/19/2014 1603   LDLCALC 27 10/09/2019 1517      Wt Readings from Last 3 Encounters:  10/31/21 164 lb 6.4 oz (74.6 kg)  02/19/21 161 lb (73 kg)  11/05/20 161 lb (73 kg)      Other studies Reviewed: Additional studies/ records that were reviewed today include: Labs Review of the above records demonstrates:  Please see elsewhere in the note.     ASSESSMENT AND PLAN:  ATRIAL FIB:   Ms. MESCAL FLINCHBAUGH has a CHA2DS2 - VASc score of 4.  She tolerates anticoagulation.  She had reasonable rate control.  No change in therapy.  LBBB: This has been chronic.  No change in therapy.  She has no symptomatic bradycardia arrhythmias.  CAROTID STENOSIS: A couple of years ago she had 49 to 59% right stenosis and she is overdue for follow-up and I will order this.  RISK REDUCTION: I do see a recent labs earlier this year.  Her A1c was 7.  I do not see a recent lipid profile on and will order that with a goal LDL at least less than 100.   Current medicines are reviewed at length with the patient today.  The patient does not have concerns regarding medicines.  The following changes have been made:  None  Labs/ tests ordered today include: none  Orders Placed This Encounter  Procedures   Lipid panel   EKG 12-Lead   VAS US CAROTID  Disposition:   FU with me in 12 months. Ronnell Guadalajara, MD  10/31/2021 5:19 PM    [place]

## 2021-10-31 ENCOUNTER — Encounter: Payer: Self-pay | Admitting: Cardiology

## 2021-10-31 ENCOUNTER — Ambulatory Visit: Payer: Medicare Other | Admitting: Cardiology

## 2021-10-31 VITALS — BP 141/86 | HR 88 | Ht 63.0 in | Wt 164.4 lb

## 2021-10-31 DIAGNOSIS — I482 Chronic atrial fibrillation, unspecified: Secondary | ICD-10-CM | POA: Diagnosis not present

## 2021-10-31 DIAGNOSIS — E785 Hyperlipidemia, unspecified: Secondary | ICD-10-CM | POA: Diagnosis not present

## 2021-10-31 DIAGNOSIS — I6523 Occlusion and stenosis of bilateral carotid arteries: Secondary | ICD-10-CM | POA: Diagnosis not present

## 2021-10-31 NOTE — Patient Instructions (Signed)
Medication Instructions:  Your physician recommends that you continue on your current medications as directed. Please refer to the Current Medication list given to you today.  *If you need a refill on your cardiac medications before your next appointment, please call your pharmacy*   Lab Work: Your physician recommends that you return for lab work in: for [place] lipid panel  If you have labs (blood work) drawn today and your tests are completely normal, you will receive your results only by: [place] (if you have [place]) OR A paper copy in the mail If you have any lab test that is abnormal or we need to change your treatment, we will call you to review the results.   Testing/Procedures: Your physician has requested that you have a carotid duplex. This test is an ultrasound of the carotid arteries in your neck. It looks at blood flow through these arteries that supply the brain with blood. Allow one hour for this exam. There are no restrictions or special instructions. This procedure will be done at [place]    Follow-Up: At [facility], you and your health needs are our priority.  As part of our continuing mission to provide you with exceptional heart care, we have created designated Provider Care Teams.  These Care Teams include your primary Cardiologist (physician) and Advanced Practice Providers (APPs -  Physician Assistants and Nurse Practitioners) who all work together to provide you with the care you need, when you need it.  We recommend signing up for the patient portal called "MyChart".  Sign up information is provided on this After Visit Summary.  MyChart is used to connect with patients for Virtual Visits (Telemedicine).  Patients are able to view lab/test results, encounter notes, upcoming appointments, etc.  Non-urgent messages can be sent to your provider as well.   To learn more about what you can do with MyChart, go to [email or web address].     Your next appointment:   12 month(s)  The format for your next appointment:   In Person  Provider:   Minus Breeding, MD

## 2021-11-05 ENCOUNTER — Ambulatory Visit (HOSPITAL_COMMUNITY)
Admission: RE | Admit: 2021-11-05 | Discharge: 2021-11-05 | Disposition: A | Discharge status: Home / Self Care | Payer: Medicare Other | Source: Ambulatory Visit | Attending: Internal Medicine | Admitting: Internal Medicine

## 2021-11-05 DIAGNOSIS — I6523 Occlusion and stenosis of bilateral carotid arteries: Secondary | ICD-10-CM | POA: Insufficient documentation

## 2021-11-05 DIAGNOSIS — I482 Chronic atrial fibrillation, unspecified: Secondary | ICD-10-CM | POA: Insufficient documentation

## 2021-11-05 LAB — LIPID PANEL
Chol/HDL Ratio: 2.4 ratio (ref 0.0–4.4)
Cholesterol, Total: 120 mg/dL (ref 100–199)
HDL: 50 mg/dL (ref >39)
LDL Chol Calc (NIH): 40 mg/dL (ref 0–99)
Triglycerides: 188 mg/dL — ABNORMAL HIGH (ref 0–149)
VLDL Cholesterol Cal: 30 mg/dL (ref 5–40)

## 2021-11-11 ENCOUNTER — Encounter: Payer: Self-pay | Admitting: *Deleted

## 2021-11-28 ENCOUNTER — Ambulatory Visit: Payer: Medicare Other | Attending: Cardiology

## 2021-12-03 ENCOUNTER — Other Ambulatory Visit (HOSPITAL_COMMUNITY): Payer: Self-pay | Admitting: Cardiology

## 2021-12-03 DIAGNOSIS — I779 Disorder of arteries and arterioles, unspecified: Secondary | ICD-10-CM

## 2021-12-07 ENCOUNTER — Other Ambulatory Visit: Payer: Self-pay | Admitting: Cardiology

## 2021-12-07 DIAGNOSIS — I482 Chronic atrial fibrillation, unspecified: Secondary | ICD-10-CM

## 2021-12-08 ENCOUNTER — Telehealth: Payer: Self-pay | Admitting: *Deleted

## 2021-12-08 NOTE — Telephone Encounter (Signed)
Called pt since she is overdue for INR monitoring; there was answer so left a message to call Anticoagulation/Coumadin Clinic back at [phone]

## 2021-12-08 NOTE — Telephone Encounter (Signed)
Called pt since she is overdue for INR monitoring; there was answer so left a message to call Anticoagulation/Coumadin Clinic back at [phone]

## 2021-12-08 NOTE — Telephone Encounter (Signed)
Last OV with Dr. Percival Spanish on 10/31/2021 Last INR 10/17/2021 and No Show for 11/28/2021 appt.  Will need to call for an appt-NO REFILL AS THIS IS A MONITORED MEDICATION

## 2021-12-09 NOTE — Telephone Encounter (Signed)
Called pt since she is overdue for an appt and pending a refill. Had to leave another message for her to call back.

## 2021-12-11 ENCOUNTER — Ambulatory Visit: Payer: Medicare Other | Attending: Cardiology | Admitting: *Deleted

## 2021-12-11 DIAGNOSIS — Z5181 Encounter for therapeutic drug level monitoring: Secondary | ICD-10-CM | POA: Diagnosis not present

## 2021-12-11 DIAGNOSIS — I4891 Unspecified atrial fibrillation: Secondary | ICD-10-CM | POA: Diagnosis not present

## 2021-12-11 LAB — POCT INR: INR: 2.4 (ref 2.0–3.0)

## 2021-12-11 NOTE — Patient Instructions (Signed)
Description   Ccontinue taking warfarin 1.5 tablets daily except for 1 tablet on Mondays. Recheck INR 6 weeks. Anticoagulation Clinic [phone]

## 2022-01-23 ENCOUNTER — Ambulatory Visit: Payer: Medicare Other | Attending: Cardiology

## 2022-01-23 DIAGNOSIS — I4891 Unspecified atrial fibrillation: Secondary | ICD-10-CM | POA: Diagnosis not present

## 2022-01-23 DIAGNOSIS — Z5181 Encounter for therapeutic drug level monitoring: Secondary | ICD-10-CM | POA: Diagnosis not present

## 2022-01-23 LAB — POCT INR: INR: 2.6 (ref 2.0–3.0)

## 2022-01-23 NOTE — Patient Instructions (Addendum)
Description   Continue taking warfarin 1.5 tablets daily except for 1 tablet on Mondays. Recheck INR 6 weeks. Anticoagulation Clinic #[phone]        

## 2022-03-04 ENCOUNTER — Other Ambulatory Visit (INDEPENDENT_AMBULATORY_CARE_PROVIDER_SITE_OTHER): Payer: Medicare Other

## 2022-03-04 ENCOUNTER — Encounter: Payer: Self-pay | Admitting: Physician Assistant

## 2022-03-04 ENCOUNTER — Ambulatory Visit: Payer: Medicare Other | Admitting: Physician Assistant

## 2022-03-04 VITALS — BP 124/78 | HR 88 | Ht 63.0 in | Wt 155.4 lb

## 2022-03-04 DIAGNOSIS — R197 Diarrhea, unspecified: Secondary | ICD-10-CM

## 2022-03-04 DIAGNOSIS — R195 Other fecal abnormalities: Secondary | ICD-10-CM

## 2022-03-04 LAB — CBC WITH DIFFERENTIAL/PLATELET
Basophils Absolute: 0 10*3/uL (ref 0.0–0.1)
Basophils Relative: 0.6 % (ref 0.0–3.0)
Eosinophils Absolute: 0.3 10*3/uL (ref 0.0–0.7)
Eosinophils Relative: 4.6 % (ref 0.0–5.0)
HCT: 38.9 % (ref 36.0–46.0)
Hemoglobin: 12.9 g/dL (ref 12.0–15.0)
Lymphocytes Relative: 30.9 % (ref 12.0–46.0)
Lymphs Abs: 2.1 10*3/uL (ref 0.7–4.0)
MCHC: 33.3 g/dL (ref 30.0–36.0)
MCV: 92.5 fl (ref 78.0–100.0)
Monocytes Absolute: 0.5 10*3/uL (ref 0.1–1.0)
Monocytes Relative: 7.1 % (ref 3.0–12.0)
Neutro Abs: 3.9 10*3/uL (ref 1.4–7.7)
Neutrophils Relative %: 56.8 % (ref 43.0–77.0)
Platelets: 252 10*3/uL (ref 150.0–400.0)
RBC: 4.2 Mil/uL (ref 3.87–5.11)
RDW: 14.4 % (ref 11.5–15.5)
WBC: 6.9 10*3/uL (ref 4.0–10.5)

## 2022-03-04 LAB — BASIC METABOLIC PANEL
BUN: 12 mg/dL (ref 6–23)
CO2: 28 mEq/L (ref 19–32)
Calcium: 10 mg/dL (ref 8.4–10.5)
Chloride: 104 mEq/L (ref 96–112)
Creatinine, Ser: 0.83 mg/dL (ref 0.40–1.20)
GFR: 69.83 mL/min (ref >60.00)
Glucose, Bld: 126 mg/dL — ABNORMAL HIGH (ref 70–99)
Potassium: 3.9 mEq/L (ref 3.5–5.1)
Sodium: 139 mEq/L (ref 135–145)

## 2022-03-04 LAB — PROTIME-INR
INR: 2.7 ratio — ABNORMAL HIGH (ref 0.8–1.0)
Prothrombin Time: 28.2 s — ABNORMAL HIGH (ref 9.6–13.1)

## 2022-03-04 MED ORDER — DICYCLOMINE HCL 10 MG PO CAPS: 10.0000 mg | ORAL_CAPSULE | Freq: Three times a day (TID) | ORAL | 2 refills | Status: DC | PRN

## 2022-03-04 NOTE — Progress Notes (Signed)
Subjective:    Patient ID: [name], female    DOB: 1950-12-16, 71 y.o.   MRN: [id number]  HPI [name] is a 71 year old white female, established with Dr. Henrene Pastor, last seen in the office about a year ago when she had undergone EGD and colonoscopy.  She comes in today with concerns about recent dark stools, and some abdominal discomfort.  She has also been having loose stools on further questioning for several months. She has history of atrial fibrillation, on Coumadin, history of prior TIA/carotid stenosis, hypertension, GERD, diverticular disease adult onset diabetes mellitus and fibromyalgia. At EGD December 2022 this was a normal exam status post Nissen fundoplication and colonoscopy at that same setting with removal of 2 small polyps which were tubular adenomas and noted to have multiple diverticuli.  No follow-up planned due to age.  Patient says a few weeks ago she had noted some bright red blood on the tissue with bowel movements, then several days later started noticing that her stools were dark, no obvious blood.  She had been having some intermittent left lower quadrant discomfort though nothing on a regular basis.  She says her stools recently have been loose and somewhat urgent.  Her husband says she has at least 6 bowel movements per day and frequently gets at up at night with diarrhea.  This has been going on for 4 to 5 months.  The diarrhea will occur whether she has eaten or not but sometimes can be urgent after eating.  They have used some Kaopectate but have not had any Pepto-Bismol.  Not on any NSAIDs, uses occasional Tylenol.  No recent antibiotics or new medications.  She says it has been a very stressful year.  Her sister is currently in a rehab facility and is not doing well, and her husband had just been in the hospital within the past month or so after an MI and also had gallbladder problems.  He says that she is anxious and often times this will affect her  gut. Meloxicam is on her med list but she says she has not taken that for a long time.  She is not on a regular PPI.  Review of Systems Pertinent positive and negative review of systems were noted in the above HPI section.  All other review of systems was otherwise negative.   Outpatient Encounter Medications as of 03/04/2022  Medication Sig   acetaminophen (TYLENOL) 500 MG tablet Take 500 mg by mouth every 6 (six) hours as needed for mild pain or headache.   Alcohol Swabs (ALCOHOL WIPES) 70 % PADS Use 1 swab as needed to clean site for blood sugar check.   atenolol (TENORMIN) 25 MG tablet Take 1.5 tablets (37.5 mg total) by mouth daily.   buPROPion (WELLBUTRIN XL) 150 MG 24 hr tablet Take 150 mg by mouth daily.   Cholecalciferol (VITAMIN D3 PO) Take 5,000 Int'l Units by mouth daily.   dicyclomine (BENTYL) 10 MG capsule Take 1 capsule (10 mg total) by mouth 3 (three) times daily as needed (diarrhea and urgency).   diltiazem (CARDIZEM CD) 180 MG 24 hr capsule Take 1 capsule (180 mg total) by mouth daily.   DULoxetine (CYMBALTA) 60 MG capsule Take 1 capsule (60 mg total) by mouth daily.   levothyroxine (SYNTHROID) 50 MCG tablet Take 1 tablet (50 mcg total) by mouth every morning.   losartan (COZAAR) 25 MG tablet Take 1 tablet (25 mg total) by mouth daily.   metFORMIN (GLUCOPHAGE-XR) 500 MG 24  hr tablet TAKE 2 TABLETS(1000 MG) BY MOUTH TWICE DAILY WITH FOOD   Multiple Vitamin (MULTIVITAMIN) tablet Take 1 tablet by mouth daily.   omeprazole (PRILOSEC) 40 MG capsule Take 40 mg by mouth daily.   rosuvastatin (CRESTOR) 20 MG tablet Take 1 tablet (20 mg total) by mouth daily.   vitamin B-12 (CYANOCOBALAMIN) 500 MCG tablet Take 500 mcg by mouth daily.   warfarin (COUMADIN) 2.5 MG tablet TAKE 1 TO 1 AND 1/2 TABLETS BY MOUTH DAILY AS DIRECTED BY COUMADIN CLINIC   ACCU-CHEK GUIDE test strip USE 1 STRIP TO CHECK BLOOD SUGAR EVERY DAY (Patient not taking: Reported on 03/04/2022)   Blood Glucose Calibration  (MYGLUCOHEALTH CONTROL) SOLN 1 application by Misc.(Non-Drug; Combo Route) route once a week. Use to check that meter is working properly once weekly. (Patient not taking: Reported on 03/04/2022)   Blood Glucose Monitoring Suppl (ACCU-CHEK GUIDE ME) w/Device KIT daily. (Patient not taking: Reported on 03/04/2022)   Blood Glucose Monitoring Suppl (GLUCOCOM BLOOD GLUCOSE MONITOR) DEVI Use meter to check blood sugar once a day.  Please dispense based on availability, insurance coverage, and patient preference. (Patient not taking: Reported on 03/04/2022)   escitalopram (LEXAPRO) 10 MG tablet Take 10 mg by mouth daily. (Patient not taking: Reported on 03/04/2022)   glucose blood (PRECISION QID TEST) test strip Use 1 strip to check blood sugar once a day. Please dispense based on availability, insurance coverage, and patient preference. (Patient not taking: Reported on 03/04/2022)   Lancet Devices (SIMPLE DIAGNOSTICS LANCING DEV) MISC Use to lance skin to check blood sugar levels once a day.  Please dispense based on availability, insurance coverage, and patient preference. (Patient not taking: Reported on 03/04/2022)   meloxicam (MOBIC) 7.5 MG tablet Take 7.5 mg by mouth daily. (Patient not taking: Reported on 03/04/2022)   No facility-administered encounter medications on file as of 03/04/2022.   Allergies  Allergen Reactions   Gemfibrozil Swelling    REACTION: Angioedema   Latex Other (See Comments)    Blisters where touched or applied   Penicillins Hives    Will spread in patches all over the body. Did it involve swelling of the face/tongue/throat, SOB, or low BP? No Did it involve sudden or severe rash/hives, skin peeling, or any reaction on the inside of your mouth or nose? No Did you need to seek medical attention at a hospital or doctor's office? Yes When did it last happen?       If all above answers are "NO", may proceed with cephalosporin use.    Adhesive [Tape] Other (See Comments)     Will blister skin where applied - do not use BAND-AIDS.   Codeine Hives    Will spread in patches all over the body.   Digoxin And Related     Makes BP drop   Metformin And Related Diarrhea   Omeprazole Diarrhea   Ace Inhibitors Rash    Cough and rash    Patient Active Problem List   Diagnosis Date Noted   Educated about COVID-19 virus infection 07/27/2019   Pelvic pain 07/13/2019   Left inguinal pain 07/13/2019   Adjustment disorder with depressed mood 03/23/2019   Subcutaneous fat necrosis 01/18/2019   Vitamin D deficiency 04/28/2014   Poor dentition 04/03/2013   Healthcare maintenance 03/31/2013   Right flank pain 01/14/2013   Abnormality of gait 07/25/2012   Headache 02/25/2012   Chronic back pain 10/07/2011   Financial difficulties 10/07/2011   Hemorrhoids 10/06/2011   Anemia  10/02/2011   Diverticulosis 10/02/2011   Constipation 10/02/2011   Depression 05/09/2009   OBESITY, UNSPECIFIED 11/06/2008   Carotid stenosis 11/06/2008   FIBROMYALGIA, SEVERE 09/14/2008   GERD 09/12/2008   Osteopenia 06/22/2008   Hyperlipidemia 06/30/2007   TIA 06/01/2007   DIABETIC PERIPHERAL NEUROPATHY 04/05/2006   Diabetes type 2, controlled ([place]) 03/18/2006   Hypothyroidism 12/21/2005   Essential hypertension 12/21/2005   Atrial fibrillation ([place]) 12/21/2005   Osteoarthritis 12/21/2005   Social History   Socioeconomic History   Marital status: Married    Spouse name: Brad   Number of children: 2   Years of education: 12th    Highest education level: Not on file  Occupational History   Occupation: Retired    Fish farm manager: DISABLED  Tobacco Use   Smoking status: Never   Smokeless tobacco: Never  Vaping Use   Vaping Use: Never used  Substance and Sexual Activity   Alcohol use: Never    Alcohol/week: 0.0 standard drinks of alcohol   Drug use: Never   Sexual activity: Yes    Birth control/protection: Surgical  Other Topics Concern   Not on file  Social History Narrative   Pt  recently married to [place].  She lives in [place] with husband.  Pt is not followed by psychiatrist.  She is retired.   Social Determinants of Health   Financial Resource Strain: Not on file  Food Insecurity: Not on file  Transportation Needs: Not on file  Physical Activity: Not on file  Stress: Not on file  Social Connections: Not on file  Intimate Partner Violence: Not on file    Ms. Twardowski's family history includes Anesthesia problems in her mother; Arthritis in her mother; Colitis in her mother; Colon cancer in her paternal uncle; Coronary artery disease in her father; Crohn's disease in her mother; Dementia in her maternal uncle; Depression in her maternal uncle; Diabetes in her father and sister; Heart disease in her father; Melanoma in her father; Migraines in her sister; Other (age of onset: 68) in her daughter; Pneumonia (age of onset: 34) in her mother; Skin cancer in her father; Stroke in her father.      Objective:    Vitals:   03/04/22 1133  BP: 124/78  Pulse: 88    Physical Exam. Well-developed well-nourished older white female in no acute distress.  Accompanied by her husband who is in a motorized chair, height, Weight,155  BMI 27.52  HEENT; nontraumatic normocephalic, EOMI, PE R LA, sclera anicteric. Oropharynx; not examined today Neck; supple, no JVD Cardiovascular; regular rate and rhythm with S1-S2, no murmur rub or gallop Pulmonary; Clear bilaterally Abdomen; soft, nontender, nondistended, no palpable mass or hepatosplenomegaly, bowel sounds are active Rectal; external skin tags, on digital exam there is really no stool in the rectal vault, mucus is heme-negative Skin; benign exam, no jaundice rash or appreciable lesions Extremities; no clubbing cyanosis or edema skin warm and dry Neuro/Psych; alert and oriented x4, grossly nonfocal mood and affect appropriate , anxious       Assessment & Plan:   #46 71 year old white female on chronic Coumadin  with complaints of dark stools over the past week and a half.  Prior to that she had noticed some bright red blood on the tissue on a couple of occasions, and she is also been having problems with diarrhea over the past 4 to 5 months up to 6 times per day and sometimes urgent after eating and sometimes occurring at nighttime.  Vital signs  are stable today, exam is benign and on rectal exam no stool in the vault and he mucus heme-negative  Certainly possible that she has had some low-grade bleeding, also needs further workup for 4 to 66-monthhistory of diarrhea.  She has been under a lot of stress and this may be anxiety related.  #2 history of adenomatous colon polyps up-to-date with colonoscopy last done December 2022-no further colonoscopies planned due to age [age]diverticulosis Next #4 prior history of GERD status post Nissen fundoplication negative EGD December 2022 #4 adult onset diabetes mellitus 5.  Fibromyalgia 6.  Atrial fibrillation 7.  Prior history of TIA 8.  Carotid stenosis  Plan; CBC with differential, c-Met, pro time/INR GI path panel, stool for lactoferrin Start trial of Bentyl 10 mg p.o. 3 times daily AC as needed. Further workup pending results of above. She asks about fiber and was advised to stay off of that for the time being  AAlfredia FergusonPA-C 03/04/2022   Cc: No ref. provider found

## 2022-03-04 NOTE — Patient Instructions (Addendum)
_______________________________________________________  If you are age 72 or older, your body mass index should be between 23-30. Your Body mass index is 27.52 kg/m. If this is out of the aforementioned range listed, please consider follow up with your Primary Care Provider.  If you are age 8 or younger, your body mass index should be between 19-25. Your Body mass index is 27.52 kg/m. If this is out of the aformentioned range listed, please consider follow up with your Primary Care Provider.   ________________________________________________________  The [facility] providers would like to encourage you to use [facility] to communicate with providers for non-urgent requests or questions.  Due to long hold times on the telephone, sending your provider a message by [facility] may be a faster and more efficient way to get a response.  Please allow 48 business hours for a response.  Please remember that this is for non-urgent requests.  _______________________________________________________  Your provider has requested that you go to the basement level for lab work before leaving today. Press "B" on the elevator. The lab is located at the first door on the left as you exit the elevator.  We have sent the following medications to your pharmacy for you to pick up at your convenience: Bentyl 3 times a day as needed for diarrhea and urgency  It was a pleasure to see you today!  Thank you for trusting me with your gastrointestinal care!

## 2022-03-05 ENCOUNTER — Telehealth: Payer: Self-pay | Admitting: Cardiology

## 2022-03-05 NOTE — Telephone Encounter (Signed)
Called pt back. No answer. LMOM to call coumadin clinic back.

## 2022-03-05 NOTE — Telephone Encounter (Signed)
Pt would like a callback regarding appt for tomorrow. Please advise

## 2022-03-05 NOTE — Progress Notes (Signed)
Assessment and plan noted ?

## 2022-03-06 ENCOUNTER — Ambulatory Visit (INDEPENDENT_AMBULATORY_CARE_PROVIDER_SITE_OTHER): Payer: Medicare Other | Admitting: Cardiovascular Disease

## 2022-03-06 ENCOUNTER — Telehealth: Payer: Self-pay

## 2022-03-06 ENCOUNTER — Ambulatory Visit: Payer: Medicare Other

## 2022-03-06 DIAGNOSIS — Z5181 Encounter for therapeutic drug level monitoring: Secondary | ICD-10-CM

## 2022-03-06 NOTE — Telephone Encounter (Signed)
Lpmtcb and discuss today's appt.  She called 12/28.

## 2022-03-06 NOTE — Telephone Encounter (Signed)
Called pt back. No answer. LMOM.

## 2022-03-13 ENCOUNTER — Other Ambulatory Visit: Payer: Medicare Other

## 2022-03-13 DIAGNOSIS — R195 Other fecal abnormalities: Secondary | ICD-10-CM

## 2022-03-13 DIAGNOSIS — R197 Diarrhea, unspecified: Secondary | ICD-10-CM

## 2022-03-24 LAB — FECAL LACTOFERRIN, QUANT
Fecal Lactoferrin: NEGATIVE
MICRO NUMBER:: 14394236
SPECIMEN QUALITY:: ADEQUATE

## 2022-03-24 LAB — PANCREATIC ELASTASE, FECAL: Pancreatic Elastase-1, Stool: >500 mcg/g

## 2022-04-17 ENCOUNTER — Ambulatory Visit: Payer: Medicare Other | Attending: Cardiovascular Disease | Admitting: *Deleted

## 2022-04-17 DIAGNOSIS — Z5181 Encounter for therapeutic drug level monitoring: Secondary | ICD-10-CM | POA: Diagnosis not present

## 2022-04-17 DIAGNOSIS — I4891 Unspecified atrial fibrillation: Secondary | ICD-10-CM | POA: Diagnosis not present

## 2022-04-17 LAB — POCT INR: POC INR: 2.7

## 2022-04-17 NOTE — Patient Instructions (Signed)
Description   Continue taking warfarin 1.5 tablets daily except for 1 tablet on Mondays. Recheck INR 6 weeks. Anticoagulation Clinic [phone]

## 2022-05-29 ENCOUNTER — Ambulatory Visit: Payer: Medicare HMO | Attending: Cardiology | Admitting: *Deleted

## 2022-05-29 DIAGNOSIS — I4891 Unspecified atrial fibrillation: Secondary | ICD-10-CM | POA: Diagnosis not present

## 2022-05-29 DIAGNOSIS — Z5181 Encounter for therapeutic drug level monitoring: Secondary | ICD-10-CM | POA: Diagnosis not present

## 2022-05-29 LAB — POCT INR: POC INR: 5.1

## 2022-05-29 NOTE — Patient Instructions (Signed)
Description   Hold warfarin today and tomorrow Then continue taking warfarin 1.5 tablets daily except for 1 tablet on Mondays. Recheck INR 2 weeks. Anticoagulation Clinic [phone]

## 2022-06-12 ENCOUNTER — Ambulatory Visit: Payer: Medicare HMO | Attending: Cardiology

## 2022-06-12 DIAGNOSIS — I4891 Unspecified atrial fibrillation: Secondary | ICD-10-CM | POA: Diagnosis not present

## 2022-06-12 LAB — POCT INR: INR: 4.1 — AB (ref 2.0–3.0)

## 2022-06-12 NOTE — Patient Instructions (Addendum)
Description   Hold warfarin today and then START taking warfarin 1.5 tablets daily except for 1 tablet on Mondays and Wednesday.  Stay consistent with greens each week (1 per week)  Recheck INR 2 weeks.  Anticoagulation Clinic [phone]

## 2022-06-25 ENCOUNTER — Other Ambulatory Visit: Payer: Self-pay | Admitting: Cardiology

## 2022-06-25 DIAGNOSIS — I482 Chronic atrial fibrillation, unspecified: Secondary | ICD-10-CM

## 2022-06-25 NOTE — Telephone Encounter (Signed)
Prescription refill request received for warfarin Lov: Hochrein 10/31/2021 Next INR check:  06/26/2022 Warfarin tablet strength: 2.5 mg   Refill sent.

## 2022-06-26 ENCOUNTER — Ambulatory Visit: Payer: Medicare HMO | Attending: Cardiology

## 2022-06-26 DIAGNOSIS — Z5181 Encounter for therapeutic drug level monitoring: Secondary | ICD-10-CM

## 2022-06-26 DIAGNOSIS — I4891 Unspecified atrial fibrillation: Secondary | ICD-10-CM | POA: Diagnosis not present

## 2022-06-26 LAB — POCT INR: INR: 2.2 (ref 2.0–3.0)

## 2022-06-26 NOTE — Patient Instructions (Signed)
Description   Continue on same dosage of Warfarin 1.5 tablets daily except for 1 tablet on Mondays and Wednesdays.  Stay consistent with greens each week (1 per week)  Recheck INR 3 weeks.  Anticoagulation Clinic [phone]

## 2022-07-16 ENCOUNTER — Ambulatory Visit: Payer: Medicare HMO | Attending: Cardiology | Admitting: *Deleted

## 2022-07-16 DIAGNOSIS — I4891 Unspecified atrial fibrillation: Secondary | ICD-10-CM | POA: Diagnosis not present

## 2022-07-16 DIAGNOSIS — Z5181 Encounter for therapeutic drug level monitoring: Secondary | ICD-10-CM | POA: Diagnosis not present

## 2022-07-16 LAB — POCT INR: INR: 1.9 — AB (ref 2.0–3.0)

## 2022-07-16 NOTE — Patient Instructions (Addendum)
Description   Today take 2 tablets of warfarin then continue taking Warfarin 1.5 tablets daily except for 1 tablet on Mondays and Wednesdays. Stay consistent with greens each week (1 per week).  Recheck INR 3 weeks. Anticoagulation Clinic [phone]

## 2022-08-06 ENCOUNTER — Ambulatory Visit: Payer: Medicare HMO | Attending: Cardiology

## 2022-08-06 DIAGNOSIS — I4891 Unspecified atrial fibrillation: Secondary | ICD-10-CM

## 2022-08-06 DIAGNOSIS — Z5181 Encounter for therapeutic drug level monitoring: Secondary | ICD-10-CM

## 2022-08-06 LAB — POCT INR: INR: 2.8 (ref 2.0–3.0)

## 2022-08-06 NOTE — Patient Instructions (Signed)
continue taking Warfarin 1.5 tablets daily except for 1 tablet on Mondays and Wednesdays. Stay consistent with greens each week (1 per week).  Recheck INR 4 weeks. Anticoagulation Clinic [phone]

## 2022-08-18 ENCOUNTER — Other Ambulatory Visit: Payer: Self-pay | Admitting: Cardiology

## 2022-08-18 DIAGNOSIS — I482 Chronic atrial fibrillation, unspecified: Secondary | ICD-10-CM

## 2022-08-27 ENCOUNTER — Ambulatory Visit (HOSPITAL_COMMUNITY)
Admission: EM | Admit: 2022-08-27 | Discharge: 2022-08-27 | Disposition: A | Discharge status: Home / Self Care | Payer: Medicare HMO | Attending: Family Medicine | Admitting: Family Medicine

## 2022-08-27 NOTE — ED Notes (Signed)
Patient arrived in department expecting to have a CT scan.  Patient and spouse reports patient's pcp had spoken to them and it was their impression patient would receive a CAT scan .  Patient declined being seen here for any reason.  Dr Tracie Harrier notified.  Dr Tracie Harrier did not evaluate patient.  Patient and spouse returned to patient access requesting refund/Dr Hagler agreed.

## 2022-09-03 ENCOUNTER — Ambulatory Visit: Payer: Medicare HMO

## 2022-09-14 ENCOUNTER — Ambulatory Visit: Payer: Medicare HMO | Attending: Cardiology | Admitting: *Deleted

## 2022-09-14 DIAGNOSIS — Z5181 Encounter for therapeutic drug level monitoring: Secondary | ICD-10-CM

## 2022-09-14 DIAGNOSIS — I4891 Unspecified atrial fibrillation: Secondary | ICD-10-CM | POA: Diagnosis not present

## 2022-09-14 LAB — POCT INR: INR: 3.7 — AB (ref 2.0–3.0)

## 2022-09-14 NOTE — Patient Instructions (Signed)
Description   Do not take any warfarin today then continue taking Warfarin 1.5 tablets daily except for 1 tablet on Mondays and Wednesdays. Stay consistent with greens each week (1 per week).  Recheck INR 3 weeks. Anticoagulation Clinic [phone]

## 2022-10-05 ENCOUNTER — Emergency Department (HOSPITAL_COMMUNITY): Payer: Medicare HMO

## 2022-10-05 ENCOUNTER — Encounter (HOSPITAL_COMMUNITY): Payer: Self-pay | Admitting: Emergency Medicine

## 2022-10-05 ENCOUNTER — Ambulatory Visit: Payer: Medicare HMO

## 2022-10-05 ENCOUNTER — Other Ambulatory Visit: Payer: Self-pay

## 2022-10-05 ENCOUNTER — Emergency Department (HOSPITAL_COMMUNITY)
Admission: EM | Admit: 2022-10-05 | Discharge: 2022-10-06 | Disposition: A | Discharge status: Home / Self Care | Payer: Medicare HMO | Attending: Emergency Medicine | Admitting: Emergency Medicine

## 2022-10-05 DIAGNOSIS — Z7901 Long term (current) use of anticoagulants: Secondary | ICD-10-CM | POA: Insufficient documentation

## 2022-10-05 DIAGNOSIS — R1030 Lower abdominal pain, unspecified: Secondary | ICD-10-CM | POA: Diagnosis present

## 2022-10-05 DIAGNOSIS — K4090 Unilateral inguinal hernia, without obstruction or gangrene, not specified as recurrent: Secondary | ICD-10-CM

## 2022-10-05 DIAGNOSIS — E1165 Type 2 diabetes mellitus with hyperglycemia: Secondary | ICD-10-CM | POA: Diagnosis not present

## 2022-10-05 DIAGNOSIS — Z7984 Long term (current) use of oral hypoglycemic drugs: Secondary | ICD-10-CM | POA: Insufficient documentation

## 2022-10-05 DIAGNOSIS — Z79899 Other long term (current) drug therapy: Secondary | ICD-10-CM | POA: Diagnosis not present

## 2022-10-05 DIAGNOSIS — I10 Essential (primary) hypertension: Secondary | ICD-10-CM | POA: Insufficient documentation

## 2022-10-05 DIAGNOSIS — Z91040 Latex allergy status: Secondary | ICD-10-CM | POA: Diagnosis not present

## 2022-10-05 DIAGNOSIS — K439 Ventral hernia without obstruction or gangrene: Secondary | ICD-10-CM | POA: Diagnosis not present

## 2022-10-05 LAB — COMPREHENSIVE METABOLIC PANEL
ALT: 21 U/L (ref 0–44)
AST: 22 U/L (ref 15–41)
Albumin: 3.6 g/dL (ref 3.5–5.0)
Alkaline Phosphatase: 66 U/L (ref 38–126)
Anion gap: 12 (ref 5–15)
BUN: 14 mg/dL (ref 8–23)
CO2: 23 mmol/L (ref 22–32)
Calcium: 9.5 mg/dL (ref 8.9–10.3)
Chloride: 100 mmol/L (ref 98–111)
Creatinine, Ser: 0.7 mg/dL (ref 0.44–1.00)
GFR, Estimated: >60 mL/min (ref >60)
Glucose, Bld: 190 mg/dL — ABNORMAL HIGH (ref 70–99)
Potassium: 3.6 mmol/L (ref 3.5–5.1)
Sodium: 135 mmol/L (ref 135–145)
Total Bilirubin: 1 mg/dL (ref 0.3–1.2)
Total Protein: 7.2 g/dL (ref 6.5–8.1)

## 2022-10-05 LAB — CBC
HCT: 39.7 % (ref 36.0–46.0)
Hemoglobin: 12.6 g/dL (ref 12.0–15.0)
MCH: 29.7 pg (ref 26.0–34.0)
MCHC: 31.7 g/dL (ref 30.0–36.0)
MCV: 93.6 fL (ref 80.0–100.0)
Platelets: 272 10*3/uL (ref 150–400)
RBC: 4.24 MIL/uL (ref 3.87–5.11)
RDW: 13.5 % (ref 11.5–15.5)
WBC: 5.6 10*3/uL (ref 4.0–10.5)
nRBC: 0 % (ref 0.0–0.2)

## 2022-10-05 LAB — PROTIME-INR
INR: 2.4 — ABNORMAL HIGH (ref 0.8–1.2)
Prothrombin Time: 26 seconds — ABNORMAL HIGH (ref 11.4–15.2)

## 2022-10-05 LAB — LIPASE, BLOOD: Lipase: 41 U/L (ref 11–51)

## 2022-10-05 LAB — POC OCCULT BLOOD, ED: Fecal Occult Bld: NEGATIVE

## 2022-10-05 LAB — HEMOGLOBIN AND HEMATOCRIT, BLOOD
HCT: 35.7 % — ABNORMAL LOW (ref 36.0–46.0)
Hemoglobin: 11.3 g/dL — ABNORMAL LOW (ref 12.0–15.0)

## 2022-10-05 MED ORDER — MORPHINE SULFATE (PF) 4 MG/ML IV SOLN
4.0000 mg | Freq: Once | INTRAVENOUS | Status: AC
  Administered 2022-10-05: 4 mg via INTRAVENOUS
  Filled 2022-10-05: qty 1

## 2022-10-05 MED ORDER — IOHEXOL 350 MG/ML SOLN
75.0000 mL | Freq: Once | INTRAVENOUS | Status: AC | PRN
  Administered 2022-10-05: 75 mL via INTRAVENOUS

## 2022-10-05 MED ORDER — SODIUM CHLORIDE 0.9 % IV BOLUS
1000.0000 mL | Freq: Once | INTRAVENOUS | Status: AC
  Administered 2022-10-05: 1000 mL via INTRAVENOUS

## 2022-10-05 MED ORDER — FENTANYL CITRATE PF 50 MCG/ML IJ SOSY
50.0000 ug | PREFILLED_SYRINGE | Freq: Once | INTRAMUSCULAR | Status: AC
  Administered 2022-10-05: 50 ug via INTRAVENOUS
  Filled 2022-10-05: qty 1

## 2022-10-05 MED ORDER — ONDANSETRON HCL 4 MG/2ML IJ SOLN
4.0000 mg | Freq: Once | INTRAMUSCULAR | Status: AC
  Administered 2022-10-05: 4 mg via INTRAVENOUS
  Filled 2022-10-05: qty 2

## 2022-10-05 NOTE — ED Triage Notes (Signed)
Pt arrives via EMS from home with generalized abd for 4-5 days that worsened today. Lump noticed on right side when palpating. Last BM last night, diarrhea. Reports decreased appetite for a month.

## 2022-10-05 NOTE — ED Provider Notes (Signed)
[place] EMERGENCY DEPARTMENT AT [facility] Provider Note   CSN: [id number] Arrival date & time: 10/05/22  1501     History  Chief Complaint  Patient presents with   Abdominal Pain    [name] is a 72 y.o. female with past medical history significant for hyperlipidemia, diabetes, hypertension, A-fib, chronic warfarin use, constipation, known hemorrhoids, depression, fibromyalgia who presents with concern for acute on chronic abdominal pain.  She reports 9/10 on arrival 7 hours prior to my evaluation of the patient.  She reports that it has been going on for weeks to months but worse over the last 4 to 5 days.  Reports decreased appetite, frequent diarrhea, and some blood in stool.  She reports that there is a significant amount of blood filling up the toilet bowl the last 2 bowel movements.  She denies any bleeding in between bowel movements.   Abdominal Pain      Home Medications Prior to Admission medications   Medication Sig Start Date End Date Taking? Authorizing Provider  ACCU-CHEK GUIDE test strip USE 1 STRIP TO CHECK BLOOD SUGAR EVERY DAY Patient not taking: Reported on 03/04/2022 11/06/20   [provider]  acetaminophen (TYLENOL) 500 MG tablet Take 500 mg by mouth every 6 (six) hours as needed for mild pain or headache.    [provider]  Alcohol Swabs (ALCOHOL WIPES) 70 % PADS Use 1 swab as needed to clean site for blood sugar check. 11/06/20   [provider]  atenolol (TENORMIN) 25 MG tablet Take 1.5 tablets (37.5 mg total) by mouth daily. 09/18/19   Theotis Barrio, MD  Blood Glucose Calibration [facility] CONTROL) SOLN 1 application by Misc.(Non-Drug; Combo Route) route once a week. Use to check that meter is working properly once weekly. Patient not taking: Reported on 03/04/2022 11/06/20   [provider]  Blood Glucose Monitoring Suppl (ACCU-CHEK GUIDE ME) w/Device KIT daily. Patient not taking: Reported  on 03/04/2022 11/06/20   [provider]  Blood Glucose Monitoring Suppl (GLUCOCOM BLOOD GLUCOSE MONITOR) DEVI Use meter to check blood sugar once a day.  Please dispense based on availability, insurance coverage, and patient preference. Patient not taking: Reported on 03/04/2022 11/06/20   [provider]  buPROPion (WELLBUTRIN XL) 150 MG 24 hr tablet Take 150 mg by mouth daily.    [provider]  Cholecalciferol (VITAMIN D3 PO) Take 5,000 Int'l Units by mouth daily.    [provider]  dicyclomine (BENTYL) 10 MG capsule Take 1 capsule (10 mg total) by mouth 3 (three) times daily as needed (diarrhea and urgency). 10/06/22   Azula Zappia H, PA-C  diltiazem (CARDIZEM CD) 180 MG 24 hr capsule Take 1 capsule (180 mg total) by mouth daily. 03/13/19   Levora Dredge, MD  DULoxetine (CYMBALTA) 60 MG capsule Take 1 capsule (60 mg total) by mouth daily. 03/13/19   Levora Dredge, MD  escitalopram (LEXAPRO) 10 MG tablet Take 10 mg by mouth daily. Patient not taking: Reported on 03/04/2022    [provider]  glucose blood (PRECISION QID TEST) test strip Use 1 strip to check blood sugar once a day. Please dispense based on availability, insurance coverage, and patient preference. Patient not taking: Reported on 03/04/2022 11/06/20   [provider]  Lancet Devices (SIMPLE DIAGNOSTICS LANCING DEV) MISC Use to lance skin to check blood sugar levels once a day.  Please dispense based on availability, insurance coverage, and patient preference. Patient not taking: Reported  on 03/04/2022 11/06/20   [provider]  levothyroxine (SYNTHROID) 50 MCG tablet Take 1 tablet (50 mcg total) by mouth every morning. 03/13/19   Levora Dredge, MD  losartan (COZAAR) 25 MG tablet Take 1 tablet (25 mg total) by mouth daily. 03/13/19   Levora Dredge, MD  meloxicam (MOBIC) 7.5 MG tablet Take 7.5 mg by mouth daily. Patient not taking: Reported on 03/04/2022 09/04/21    [provider]  metFORMIN (GLUCOPHAGE-XR) 500 MG 24 hr tablet TAKE 2 TABLETS(1000 MG) BY MOUTH TWICE DAILY WITH FOOD 12/12/19   Eliezer Bottom, MD  Multiple Vitamin (MULTIVITAMIN) tablet Take 1 tablet by mouth daily.    [provider]  omeprazole (PRILOSEC) 40 MG capsule Take 40 mg by mouth daily.    [provider]  rosuvastatin (CRESTOR) 20 MG tablet Take 1 tablet (20 mg total) by mouth daily. 03/13/19   Levora Dredge, MD  vitamin B-12 (CYANOCOBALAMIN) 500 MCG tablet Take 500 mcg by mouth daily.    [provider]  warfarin (COUMADIN) 2.5 MG tablet TAKE 1 TO 1 AND 1/2 TABLETS BY MOUTH DAILY AS DIRECTED BY COUMADIN CLINIC 08/18/22   Rollene Rotunda, MD      Allergies    Gemfibrozil, Latex, Penicillins, Adhesive [tape], Codeine, Digoxin and related, Metformin and related, Omeprazole, and Ace inhibitors    Review of Systems   Review of Systems  Gastrointestinal:  Positive for abdominal pain.  All other systems reviewed and are negative.   Physical Exam Updated Vital Signs BP (!) 144/89   Pulse 84   Temp 98.1 F (36.7 C) (Oral)   Resp 19   Ht 5\' 3"  (1.6 m)   Wt 70 kg   LMP 06/03/1978   SpO2 97%   BMI 27.34 kg/m  Physical Exam Vitals and nursing note reviewed.  Constitutional:      General: She is not in acute distress.    Appearance: Normal appearance.  HENT:     Head: Normocephalic and atraumatic.  Eyes:     General:        Right eye: No discharge.        Left eye: No discharge.  Cardiovascular:     Rate and Rhythm: Normal rate. Rhythm irregular.     Heart sounds: No murmur heard.    No friction rub. No gallop.  Pulmonary:     Effort: Pulmonary effort is normal.     Breath sounds: Normal breath sounds.  Abdominal:     General: Bowel sounds are normal.     Palpations: Abdomen is soft.     Comments: Tender in lower abdominal quadrants without distension, bruising, no rebound, rigidity, guarding on my exam  Skin:    General: Skin is  warm and dry.     Capillary Refill: Capillary refill takes less than 2 seconds.  Neurological:     Mental Status: She is alert and oriented to person, place, and time.  Psychiatric:        Mood and Affect: Mood normal.        Behavior: Behavior normal.     ED Results / Procedures / Treatments   Labs (all labs ordered are listed, but only abnormal results are displayed) Labs Reviewed  COMPREHENSIVE METABOLIC PANEL - Abnormal; Notable for the following components:      Result Value   Glucose, Bld 190 (*)    All other components within normal limits  URINALYSIS, ROUTINE W REFLEX MICROSCOPIC - Abnormal; Notable for the following components:  Specific Gravity, Urine >1.046 (*)    Ketones, ur 5 (*)    All other components within normal limits  PROTIME-INR - Abnormal; Notable for the following components:   Prothrombin Time 26.0 (*)    INR 2.4 (*)    All other components within normal limits  HEMOGLOBIN AND HEMATOCRIT, BLOOD - Abnormal; Notable for the following components:   Hemoglobin 11.3 (*)    HCT 35.7 (*)    All other components within normal limits  LIPASE, BLOOD  CBC  POC OCCULT BLOOD, ED  I-STAT CG4 LACTIC ACID, ED    EKG EKG Interpretation Date/Time:  Monday October 05 2022 15:19:49 EDT Ventricular Rate:  102 PR Interval:    QRS Duration:  124 QT Interval:  330 QTC Calculation: 430 R Axis:   -20  Text Interpretation: Atrial fibrillation with rapid ventricular response Left bundle branch block Abnormal ECG Confirmed by Tilden Fossa [phone]) on 10/06/2022 12:07:18 AM  Radiology CT ABDOMEN PELVIS W CONTRAST  Result Date: 10/05/2022 CLINICAL DATA:  Abdominal pain, acute, nonlocalized Hernia suspected, inguinal or femoral bloody stools, R hernia concerns EXAM: CT ABDOMEN AND PELVIS WITH CONTRAST TECHNIQUE: Multidetector CT imaging of the abdomen and pelvis was performed using the standard protocol following bolus administration of intravenous contrast. RADIATION DOSE  REDUCTION: This exam was performed according to the departmental dose-optimization program which includes automated exposure control, adjustment of the mA and/or kV according to patient size and/or use of iterative reconstruction technique. CONTRAST:  75mL OMNIPAQUE IOHEXOL 350 MG/ML SOLN COMPARISON:  CT scan abdomen and pelvis from 03/10/2017 and CT scan pelvis from 07/20/2019. FINDINGS: Lower chest: There are subpleural atelectatic changes in the visualized lung bases. No overt consolidation. No pleural effusion. The heart is normal in size. No pericardial effusion. Mitral annulus calcifications noted. Hepatobiliary: The liver is normal in size. Non-cirrhotic configuration. No suspicious mass. These is mild diffuse hepatic steatosis. No intrahepatic or extrahepatic bile duct dilation. Gallbladder is surgically absent. Pancreas: Unremarkable. No pancreatic ductal dilatation or surrounding inflammatory changes. Spleen: Within normal limits. No focal lesion. Adrenals/Urinary Tract: Adrenal glands are unremarkable. No suspicious renal mass. There are several subcentimeter sized hypoattenuating foci throughout bilateral kidneys, which are too small to adequately characterize. No hydronephrosis. No renal or ureteric calculi. Unremarkable urinary bladder. Stomach/Bowel: No disproportionate dilation of the small or large bowel loops. No evidence of abnormal bowel wall thickening or inflammatory changes. The appendix is unremarkable. Please note, the ascending colon is mobile and ileocecal junction is in the right upper abdomen on this exam however it was in the right lower quadrant on the prior exam. There are multiple diverticula mainly in the left hemi colon, without imaging signs of diverticulitis. Vascular/Lymphatic: No ascites or pneumoperitoneum. No abdominal or pelvic lymphadenopathy, by size criteria. No aneurysmal dilation of the major abdominal arteries. There are mild peripheral atherosclerotic vascular  calcifications of the aorta and its major branches. Reproductive: The uterus is surgically absent. No large adnexal mass. Other: There is a left-sided, infra umbilical spigelian hernia containing fat. There are also bilateral small fat containing inguinal hernias. The soft tissues and abdominal wall are otherwise unremarkable. Musculoskeletal: No suspicious osseous lesions. There are mild - moderate multilevel degenerative changes in the visualized spine. IMPRESSION: 1. No acute inflammatory process identified within the abdomen or pelvis. 2. Left-sided Spigelian hernia containing fat. 3. Small bilateral fat containing inguinal hernias. 4. Multiple other nonacute observations, as described above. Electronically Signed   By: Jules Schick M.D.   On: 10/05/2022  17:49    Procedures Procedures    Medications Ordered in ED Medications  fentaNYL (SUBLIMAZE) injection 50 mcg (50 mcg Intravenous Given 10/05/22 1548)  iohexol (OMNIPAQUE) 350 MG/ML injection 75 mL (75 mLs Intravenous Contrast Given 10/05/22 1634)  morphine (PF) 4 MG/ML injection 4 mg (4 mg Intravenous Given 10/05/22 2303)  sodium chloride 0.9 % bolus 1,000 mL (0 mLs Intravenous Stopped 10/06/22 0101)  ondansetron (ZOFRAN) injection 4 mg (4 mg Intravenous Given 10/05/22 2303)  dicyclomine (BENTYL) capsule 10 mg (10 mg Oral Given 10/06/22 0211)    ED Course/ Medical Decision Making/ A&P                             Medical Decision Making Amount and/or Complexity of Data Reviewed Labs: ordered. Radiology: ordered.  Risk Prescription drug management.   This patient is a 72 y.o. female  who presents to the ED for concern of abdominal pain, diarrhea, hematochezia, abdominal distention.   Differential diagnoses prior to evaluation: The emergent differential diagnosis includes, but is not limited to, incarcerated, strangulated hernia, diverticulitis, diverticulosis, acute GI bleed, hemorrhoids, acute mesenteric ischemia, intra-abdominal  abscess, colitis, C. difficile, other infectious diarrhea, versus other. This is not an exhaustive differential.   Past Medical History / Co-morbidities / Social History: hyperlipidemia, diabetes, hypertension, A-fib, chronic warfarin use, constipation, known hemorrhoids, depression, fibromyalgia  Additional history: Chart reviewed. Pertinent results include: Reviewed outpatient family medicine visits, as well as cardiology visits, no recent previous emergency department visit to compare  Physical Exam: Physical exam performed. The pertinent findings include: Patient is overall well-appearing, she is in chronic A-fib, initially with some tachycardia but with normal heart rate and regular rhythm on recheck.  She is some tenderness in the lower quadrants with palpable right-sided easily reducible inguinal hernia, nonpalpable hernia noted on CT scan on the left.  Lab Tests/Imaging studies: I personally interpreted labs/imaging and the pertinent results include: UA with high specific gravity suggest mild dehydration.  Her hemoglobin is 11.3 on recheck after 12.6 on arrival.  Suspect some degree of dilution versus mild lab error as she has not had any bowel movements or rectal bleeding since arrival in the emergency department.  CMP is overall unremarkable other than mild hyperglycemia, glucose 190.  Hemoccult is negative.  Soft brown stool in rectal vault.  Initial lactic acid 0.8..  I independently interpreted CT abdomen pelvis with contrast which shows left-sided spigelian hernia, right-sided inguinal hernia, no evidence of incarceration, strangulation, no evidence of acute diverticulitis, appendicitis, or other intra-abdominal abnormality to explain patient's symptoms.  I agree with the radiologist interpretation.  Cardiac monitoring: EKG obtained and interpreted by myself and attending physician which shows: afib, rvr, improved with afib with normal rate on recheck   Medications: I ordered  medication including fluid bolus, morphine, Zofran, Bentyl for pain, nausea, dehydration.  I have reviewed the patients home medicines and have made adjustments as needed.   Disposition: After consideration of the diagnostic results and the patients response to treatment, I feel that patient with some improvement of her pain, no evidence of acute surgical abnormality after discussion with Dr. Andrey Campanile and reviewing her lab work, imaging, and clinical condition, decision made that patient is stable for discharge with surgery follow-up, will discharge with refill of Bentyl, encouraged Tylenol, abdominal binder.   emergency department workup does not suggest an emergent condition requiring admission or immediate intervention beyond what has been performed at this time. The plan  is: as above. The patient is safe for discharge and has been instructed to return immediately for worsening symptoms, change in symptoms or any other concerns.  Final Clinical Impression(s) / ED Diagnoses Final diagnoses:  Lower abdominal pain  Unilateral inguinal hernia without obstruction or gangrene, recurrence not specified  Spigelian hernia    Rx / DC Orders ED Discharge Orders          Ordered    dicyclomine (BENTYL) 10 MG capsule  3 times daily PRN        10/06/22 0431              Olene Floss, PA-C 10/06/22 0444    Ernie Avena, MD 10/06/22 [phone]

## 2022-10-05 NOTE — ED Provider Triage Note (Signed)
Emergency Medicine Provider Triage Evaluation Note  [name] , a 72 y.o. female  was evaluated in triage.  Pt complains of right-sided increased abdominal girth in the pelvic region with pain and also diarrhea for the last few days with some blood in it.  Review of Systems  Positive: Diarrhea, right-sided pelvic pain Negative: Nausea, vomiting  Physical Exam  BP 118/79 (BP Location: Right Arm)   Pulse 98   Temp 98.1 F (36.7 C) (Oral)   Resp 18   Ht 5\' 3"  (1.6 m)   Wt 70 kg   LMP 06/03/1978   SpO2 100%   BMI 27.34 kg/m  Gen:   Awake, no distress   Resp:  Normal effort  MSK:   Moves extremities without difficulty  Other:  Patient has right abdominal swelling, likely hernia  Medical Decision Making  Medically screening exam initiated at 3:40 PM.  Appropriate orders placed.  [name] was informed that the remainder of the evaluation will be completed by another provider, this initial triage assessment does not replace that evaluation, and the importance of remaining in the ED until their evaluation is complete.  Basic labs including CT abdomen pelvis ordered.  Questioning if she has hernia.  She also reports bloody stools, therefore diverticular bleed possible.  Patient is on Coumadin.   Derwood Kaplan, MD 10/05/22 [phone]

## 2022-10-05 NOTE — ED Provider Notes (Incomplete)
[place] EMERGENCY DEPARTMENT AT [facility] Provider Note   CSN: [id number] Arrival date & time: 10/05/22  1501     History {Add pertinent medical, surgical, social history, OB history to HPI:1} Chief Complaint  Patient presents with  . Abdominal Pain    [name] is a 72 y.o. female with past medical history significant for hyperlipidemia, diabetes, hypertension, A-fib, chronic warfarin use, constipation, known hemorrhoids, depression, fibromyalgia who presents with concern for acute on chronic abdominal pain.  She reports 9/10 on arrival 7 hours prior to my evaluation of the patient.  She reports that it has been going on for weeks to months but worse over the last 4 to 5 days.  Reports decreased appetite, frequent diarrhea, and some blood in stool.  She reports that there is a significant amount of blood filling up the toilet bowl the last 2 bowel movements.  She denies any bleeding in between bowel movements.   Abdominal Pain      Home Medications Prior to Admission medications   Medication Sig Start Date End Date Taking? Authorizing Provider  ACCU-CHEK GUIDE test strip USE 1 STRIP TO CHECK BLOOD SUGAR EVERY DAY Patient not taking: Reported on 03/04/2022 11/06/20   [provider]  acetaminophen (TYLENOL) 500 MG tablet Take 500 mg by mouth every 6 (six) hours as needed for mild pain or headache.    [provider]  Alcohol Swabs (ALCOHOL WIPES) 70 % PADS Use 1 swab as needed to clean site for blood sugar check. 11/06/20   [provider]  atenolol (TENORMIN) 25 MG tablet Take 1.5 tablets (37.5 mg total) by mouth daily. 09/18/19   Theotis Barrio, MD  Blood Glucose Calibration [facility] CONTROL) SOLN 1 application by Misc.(Non-Drug; Combo Route) route once a week. Use to check that meter is working properly once weekly. Patient not taking: Reported on 03/04/2022 11/06/20   [provider]  Blood Glucose Monitoring Suppl  (ACCU-CHEK GUIDE ME) w/Device KIT daily. Patient not taking: Reported on 03/04/2022 11/06/20   [provider]  Blood Glucose Monitoring Suppl (GLUCOCOM BLOOD GLUCOSE MONITOR) DEVI Use meter to check blood sugar once a day.  Please dispense based on availability, insurance coverage, and patient preference. Patient not taking: Reported on 03/04/2022 11/06/20   [provider]  buPROPion (WELLBUTRIN XL) 150 MG 24 hr tablet Take 150 mg by mouth daily.    [provider]  Cholecalciferol (VITAMIN D3 PO) Take 5,000 Int'l Units by mouth daily.    [provider]  dicyclomine (BENTYL) 10 MG capsule Take 1 capsule (10 mg total) by mouth 3 (three) times daily as needed (diarrhea and urgency). 03/04/22   Esterwood, Amy S, PA-C  diltiazem (CARDIZEM CD) 180 MG 24 hr capsule Take 1 capsule (180 mg total) by mouth daily. 03/13/19   Levora Dredge, MD  DULoxetine (CYMBALTA) 60 MG capsule Take 1 capsule (60 mg total) by mouth daily. 03/13/19   Levora Dredge, MD  escitalopram (LEXAPRO) 10 MG tablet Take 10 mg by mouth daily. Patient not taking: Reported on 03/04/2022    [provider]  glucose blood (PRECISION QID TEST) test strip Use 1 strip to check blood sugar once a day. Please dispense based on availability, insurance coverage, and patient preference. Patient not taking: Reported on 03/04/2022 11/06/20   [provider]  Lancet Devices (SIMPLE DIAGNOSTICS LANCING DEV) MISC Use to lance skin to check blood sugar levels once a day.  Please dispense based on availability,  insurance coverage, and patient preference. Patient not taking: Reported on 03/04/2022 11/06/20   [provider]  levothyroxine (SYNTHROID) 50 MCG tablet Take 1 tablet (50 mcg total) by mouth every morning. 03/13/19   Levora Dredge, MD  losartan (COZAAR) 25 MG tablet Take 1 tablet (25 mg total) by mouth daily. 03/13/19   Levora Dredge, MD  meloxicam (MOBIC) 7.5 MG tablet Take 7.5 mg by  mouth daily. Patient not taking: Reported on 03/04/2022 09/04/21   [provider]  metFORMIN (GLUCOPHAGE-XR) 500 MG 24 hr tablet TAKE 2 TABLETS(1000 MG) BY MOUTH TWICE DAILY WITH FOOD 12/12/19   Eliezer Bottom, MD  Multiple Vitamin (MULTIVITAMIN) tablet Take 1 tablet by mouth daily.    [provider]  omeprazole (PRILOSEC) 40 MG capsule Take 40 mg by mouth daily.    [provider]  rosuvastatin (CRESTOR) 20 MG tablet Take 1 tablet (20 mg total) by mouth daily. 03/13/19   Levora Dredge, MD  vitamin B-12 (CYANOCOBALAMIN) 500 MCG tablet Take 500 mcg by mouth daily.    [provider]  warfarin (COUMADIN) 2.5 MG tablet TAKE 1 TO 1 AND 1/2 TABLETS BY MOUTH DAILY AS DIRECTED BY COUMADIN CLINIC 08/18/22   Rollene Rotunda, MD      Allergies    Gemfibrozil, Latex, Penicillins, Adhesive [tape], Codeine, Digoxin and related, Metformin and related, Omeprazole, and Ace inhibitors    Review of Systems   Review of Systems  Gastrointestinal:  Positive for abdominal pain.  All other systems reviewed and are negative.   Physical Exam Updated Vital Signs BP 122/87   Pulse (!) 102   Temp 98.3 F (36.8 C) (Oral)   Resp 11   Ht 5\' 3"  (1.6 m)   Wt 70 kg   LMP 06/03/1978   SpO2 100%   BMI 27.34 kg/m  Physical Exam Vitals and nursing note reviewed.  Constitutional:      General: She is not in acute distress.    Appearance: Normal appearance.  HENT:     Head: Normocephalic and atraumatic.  Eyes:     General:        Right eye: No discharge.        Left eye: No discharge.  Cardiovascular:     Rate and Rhythm: Normal rate and regular rhythm.     Heart sounds: No murmur heard.    No friction rub. No gallop.  Pulmonary:     Effort: Pulmonary effort is normal.     Breath sounds: Normal breath sounds.  Abdominal:     General: Bowel sounds are normal.     Palpations: Abdomen is soft.     Comments: Tender in lower abdominal quadrants without distension, bruising,  no rebound, rigidity, guarding on my exam  Skin:    General: Skin is warm and dry.     Capillary Refill: Capillary refill takes less than 2 seconds.  Neurological:     Mental Status: She is alert and oriented to person, place, and time.  Psychiatric:        Mood and Affect: Mood normal.        Behavior: Behavior normal.     ED Results / Procedures / Treatments   Labs (all labs ordered are listed, but only abnormal results are displayed) Labs Reviewed  COMPREHENSIVE METABOLIC PANEL - Abnormal; Notable for the following components:      Result Value   Glucose, Bld 190 (*)    All other components within normal limits  PROTIME-INR - Abnormal;  Notable for the following components:   Prothrombin Time 26.0 (*)    INR 2.4 (*)    All other components within normal limits  LIPASE, BLOOD  CBC  URINALYSIS, ROUTINE W REFLEX MICROSCOPIC    EKG None  Radiology CT ABDOMEN PELVIS W CONTRAST  Result Date: 10/05/2022 CLINICAL DATA:  Abdominal pain, acute, nonlocalized Hernia suspected, inguinal or femoral bloody stools, R hernia concerns EXAM: CT ABDOMEN AND PELVIS WITH CONTRAST TECHNIQUE: Multidetector CT imaging of the abdomen and pelvis was performed using the standard protocol following bolus administration of intravenous contrast. RADIATION DOSE REDUCTION: This exam was performed according to the departmental dose-optimization program which includes automated exposure control, adjustment of the mA and/or kV according to patient size and/or use of iterative reconstruction technique. CONTRAST:  75mL OMNIPAQUE IOHEXOL 350 MG/ML SOLN COMPARISON:  CT scan abdomen and pelvis from 03/10/2017 and CT scan pelvis from 07/20/2019. FINDINGS: Lower chest: There are subpleural atelectatic changes in the visualized lung bases. No overt consolidation. No pleural effusion. The heart is normal in size. No pericardial effusion. Mitral annulus calcifications noted. Hepatobiliary: The liver is normal in size.  Non-cirrhotic configuration. No suspicious mass. These is mild diffuse hepatic steatosis. No intrahepatic or extrahepatic bile duct dilation. Gallbladder is surgically absent. Pancreas: Unremarkable. No pancreatic ductal dilatation or surrounding inflammatory changes. Spleen: Within normal limits. No focal lesion. Adrenals/Urinary Tract: Adrenal glands are unremarkable. No suspicious renal mass. There are several subcentimeter sized hypoattenuating foci throughout bilateral kidneys, which are too small to adequately characterize. No hydronephrosis. No renal or ureteric calculi. Unremarkable urinary bladder. Stomach/Bowel: No disproportionate dilation of the small or large bowel loops. No evidence of abnormal bowel wall thickening or inflammatory changes. The appendix is unremarkable. Please note, the ascending colon is mobile and ileocecal junction is in the right upper abdomen on this exam however it was in the right lower quadrant on the prior exam. There are multiple diverticula mainly in the left hemi colon, without imaging signs of diverticulitis. Vascular/Lymphatic: No ascites or pneumoperitoneum. No abdominal or pelvic lymphadenopathy, by size criteria. No aneurysmal dilation of the major abdominal arteries. There are mild peripheral atherosclerotic vascular calcifications of the aorta and its major branches. Reproductive: The uterus is surgically absent. No large adnexal mass. Other: There is a left-sided, infra umbilical spigelian hernia containing fat. There are also bilateral small fat containing inguinal hernias. The soft tissues and abdominal wall are otherwise unremarkable. Musculoskeletal: No suspicious osseous lesions. There are mild - moderate multilevel degenerative changes in the visualized spine. IMPRESSION: 1. No acute inflammatory process identified within the abdomen or pelvis. 2. Left-sided Spigelian hernia containing fat. 3. Small bilateral fat containing inguinal hernias. 4. Multiple other  nonacute observations, as described above. Electronically Signed   By: Jules Schick M.D.   On: 10/05/2022 17:49    Procedures Procedures  {Document cardiac monitor, telemetry assessment procedure when appropriate:1}  Medications Ordered in ED Medications  morphine (PF) 4 MG/ML injection 4 mg (has no administration in time range)  sodium chloride 0.9 % bolus 1,000 mL (has no administration in time range)  ondansetron (ZOFRAN) injection 4 mg (has no administration in time range)  fentaNYL (SUBLIMAZE) injection 50 mcg (50 mcg Intravenous Given 10/05/22 1548)  iohexol (OMNIPAQUE) 350 MG/ML injection 75 mL (75 mLs Intravenous Contrast Given 10/05/22 1634)    ED Course/ Medical Decision Making/ A&P   {   Click here for ABCD2, HEART and other calculatorsREFRESH Note before signing :1}  Medical Decision Making Amount and/or Complexity of Data Reviewed Labs: ordered. Radiology: ordered.  Risk Prescription drug management.   This patient is a 72 y.o. female  who presents to the ED for concern of abdominal pain, diarrhea, hematochezia, abdominal distention.   Differential diagnoses prior to evaluation: The emergent differential diagnosis includes, but is not limited to, incarcerated, strangulated hernia, diverticulitis, diverticulosis, acute GI bleed, hemorrhoids, acute mesenteric ischemia, intra-abdominal abscess, colitis, C. difficile, other infectious diarrhea, versus other. This is not an exhaustive differential.   Past Medical History / Co-morbidities / Social History: hyperlipidemia, diabetes, hypertension, A-fib, chronic warfarin use, constipation, known hemorrhoids, depression, fibromyalgia  Additional history: Chart reviewed. Pertinent results include: ***  Physical Exam: Physical exam performed. The pertinent findings include: ***  Lab Tests/Imaging studies: I personally interpreted labs/imaging and the pertinent results include:  ***. ***I agree  with the radiologist interpretation.  Cardiac monitoring: EKG obtained and interpreted by myself and attending physician which shows: ***   Medications: I ordered medication including ***.  I have reviewed the patients home medicines and have made adjustments as needed.   Disposition: After consideration of the diagnostic results and the patients response to treatment, I feel that *** .   ***emergency department workup does not suggest an emergent condition requiring admission or immediate intervention beyond what has been performed at this time. The plan is: ***. The patient is safe for discharge and has been instructed to return immediately for worsening symptoms, change in symptoms or any other concerns.  Final Clinical Impression(s) / ED Diagnoses Final diagnoses:  None    Rx / DC Orders ED Discharge Orders     None

## 2022-10-06 LAB — I-STAT CG4 LACTIC ACID, ED: Lactic Acid, Venous: 0.8 mmol/L (ref 0.5–1.9)

## 2022-10-06 MED ORDER — DICYCLOMINE HCL 10 MG PO CAPS
10.0000 mg | ORAL_CAPSULE | Freq: Once | ORAL | Status: AC
  Administered 2022-10-06: 10 mg via ORAL
  Filled 2022-10-06: qty 1

## 2022-10-06 MED ORDER — DICYCLOMINE HCL 10 MG PO CAPS: 10.0000 mg | ORAL_CAPSULE | Freq: Three times a day (TID) | ORAL | 2 refills | Status: DC | PRN

## 2022-10-06 NOTE — ED Notes (Signed)
1st Lac in normal range, 2nd not needed.

## 2022-10-06 NOTE — Discharge Instructions (Addendum)
Please follow-up with the surgeon whose contact information I provided above, Please use Tylenol for pain.  You may use 1000 mg of Tylenol every 6 hours.  Not to exceed 4 g of Tylenol within 24 hours.  You can use the Bentyl medication I prescribed in addition to Tylenol for abdominal pain.  I recommend using a low, abdominal binder to help with pain related to the hernia with coughing.  If you continue to have worsening pain despite the above, please follow-up in the emergency department for further evaluation and management.

## 2022-10-14 DIAGNOSIS — Z1159 Encounter for screening for other viral diseases: Secondary | ICD-10-CM | POA: Diagnosis not present

## 2022-10-14 DIAGNOSIS — F02A3 Dementia in other diseases classified elsewhere, mild, with mood disturbance: Secondary | ICD-10-CM | POA: Diagnosis not present

## 2022-10-14 DIAGNOSIS — Z6827 Body mass index (BMI) 27.0-27.9, adult: Secondary | ICD-10-CM | POA: Diagnosis not present

## 2022-10-14 DIAGNOSIS — F330 Major depressive disorder, recurrent, mild: Secondary | ICD-10-CM | POA: Diagnosis not present

## 2022-10-14 DIAGNOSIS — G300 Alzheimer's disease with early onset: Secondary | ICD-10-CM | POA: Diagnosis not present

## 2022-10-14 DIAGNOSIS — E663 Overweight: Secondary | ICD-10-CM | POA: Diagnosis not present

## 2022-10-14 DIAGNOSIS — Z79899 Other long term (current) drug therapy: Secondary | ICD-10-CM | POA: Diagnosis not present

## 2022-10-14 DIAGNOSIS — Z0001 Encounter for general adult medical examination with abnormal findings: Secondary | ICD-10-CM | POA: Diagnosis not present

## 2022-10-14 DIAGNOSIS — R0689 Other abnormalities of breathing: Secondary | ICD-10-CM | POA: Diagnosis not present

## 2022-10-14 DIAGNOSIS — G8929 Other chronic pain: Secondary | ICD-10-CM | POA: Diagnosis not present

## 2022-10-14 DIAGNOSIS — Z0189 Encounter for other specified special examinations: Secondary | ICD-10-CM | POA: Diagnosis not present

## 2022-10-20 ENCOUNTER — Ambulatory Visit: Payer: Medicare HMO

## 2022-10-20 ENCOUNTER — Ambulatory Visit: Payer: Medicare HMO | Admitting: Cardiology

## 2022-10-22 ENCOUNTER — Ambulatory Visit: Payer: Medicare HMO | Attending: Cardiology

## 2022-10-22 DIAGNOSIS — Z5181 Encounter for therapeutic drug level monitoring: Secondary | ICD-10-CM

## 2022-10-22 DIAGNOSIS — I4891 Unspecified atrial fibrillation: Secondary | ICD-10-CM | POA: Diagnosis not present

## 2022-10-22 LAB — POCT INR: INR: 3.3 — AB (ref 2.0–3.0)

## 2022-10-22 NOTE — Patient Instructions (Signed)
Description   Take 1/2 tablet today, then start taking Warfarin 1.5 tablets daily except for 1 tablet on Mondays, Wednesdays and Fridays. Stay consistent with greens each week (1 per week).  Recheck INR 3 weeks. Anticoagulation Clinic [phone]

## 2022-11-12 ENCOUNTER — Ambulatory Visit: Payer: Medicare HMO | Attending: Internal Medicine

## 2022-11-12 ENCOUNTER — Telehealth: Payer: Self-pay

## 2022-11-12 DIAGNOSIS — I4891 Unspecified atrial fibrillation: Secondary | ICD-10-CM | POA: Diagnosis not present

## 2022-11-12 DIAGNOSIS — Z5181 Encounter for therapeutic drug level monitoring: Secondary | ICD-10-CM | POA: Diagnosis not present

## 2022-11-12 LAB — POCT INR: INR: 2.2 (ref 2.0–3.0)

## 2022-11-12 NOTE — Telephone Encounter (Signed)
Pt came to coumadin clinic and states she is scheduled for colonoscopy on 11/18/22 with Dr Steele Sizer at [facility].  I personably called and spoke with Corrie Dandy at [facility] and confirmed pt is scheduled for procedure. Pt and Mary at [employer] pt was instructed to HOLD Warfarin x 5 days; however, there is no cardiac clearance. Please advise.

## 2022-11-12 NOTE — Telephone Encounter (Signed)
   Patient Name: [name]  DOB: 12-Mar-1950 MRN: [id number]  Primary Cardiologist: Rollene Rotunda, MD  Chart reviewed as part of pre-operative protocol coverage. Given past medical history and time since last visit, based on ACC/AHA guidelines, [name] is at acceptable risk for the planned procedure without further cardiovascular testing.   CHA2DS2-VASc Score = 7  This indicates a 11.2% annual risk of stroke. The patient's score is based upon: CHF History: 0 HTN History: 1 Diabetes History: 1 Stroke History: 2 Vascular Disease History: 1 Age Score: 1 Gender Score: 1   Remote history of TIA. Prior clearances have been reviewed with Dr Antoine Poche: "There is a very questionable history of any prior cerebrovascular event. I think that Lovenox bridging is not indicated."   CrCl 46mL/min using adjusted body weight Platelet count 272K   Per office protocol, patient can hold warfarin for 5 days prior to procedure. Patient will not need bridging with Lovenox around procedure.    The patient was advised that if she develops new symptoms prior to surgery to contact our office to arrange for a follow-up visit, and she verbalized understanding.  I will route this recommendation to the requesting party via Epic fax function and remove from pre-op pool.  Please call with questions.  Joni Reining, NP 11/12/2022, 4:50 PM

## 2022-11-12 NOTE — Telephone Encounter (Signed)
Patient with diagnosis of afib on warfarin for anticoagulation.    Procedure: colonoscopy Date of procedure: 11/18/22  CHA2DS2-VASc Score = 7  This indicates a 11.2% annual risk of stroke. The patient's score is based upon: CHF History: 0 HTN History: 1 Diabetes History: 1 Stroke History: 2 Vascular Disease History: 1 Age Score: 1 Gender Score: 1   Remote history of TIA. Prior clearances have been reviewed with Dr Antoine Poche: "There is a very questionable history of any prior cerebrovascular event. I think that Lovenox bridging is not indicated."  CrCl 8mL/min using adjusted body weight Platelet count 272K  Per office protocol, patient can hold warfarin for 5 days prior to procedure. Patient will not need bridging with Lovenox around procedure.  **This guidance is not considered finalized until pre-operative APP has relayed final recommendations.**

## 2022-11-12 NOTE — Patient Instructions (Signed)
Description   HOLD Warfarin 9/6 - 9/10  Resume Warfarin post procedure or as directed by your doctor - On 9/11 take 1.5 tablets and 9/12 take 2 tablets then resume taking Warfarin 1.5 tablets daily except for 1 tablet on Mondays, Wednesdays and Fridays.  Stay consistent with greens each week (1 per week).  Recheck INR 1 week post colonoscopy on 11/25/22 at 3:15pm .  Anticoagulation Clinic [phone]

## 2022-11-25 ENCOUNTER — Ambulatory Visit: Payer: Medicare HMO | Attending: Cardiovascular Disease

## 2022-11-25 DIAGNOSIS — Z5181 Encounter for therapeutic drug level monitoring: Secondary | ICD-10-CM | POA: Diagnosis not present

## 2022-11-25 DIAGNOSIS — I4891 Unspecified atrial fibrillation: Secondary | ICD-10-CM | POA: Diagnosis not present

## 2022-11-25 LAB — POCT INR: INR: 2.1 (ref 2.0–3.0)

## 2022-11-25 NOTE — Patient Instructions (Signed)
Description   Continue taking Warfarin 1.5 tablets daily except for 1 tablet on Mondays, Wednesdays and Fridays.  Stay consistent with greens each week (1 per week).  Recheck INR 2 weeks.  Anticoagulation Clinic [phone]

## 2022-12-02 ENCOUNTER — Other Ambulatory Visit (HOSPITAL_COMMUNITY): Payer: Self-pay | Admitting: Cardiology

## 2022-12-02 DIAGNOSIS — I6529 Occlusion and stenosis of unspecified carotid artery: Secondary | ICD-10-CM

## 2022-12-09 ENCOUNTER — Ambulatory Visit
Admission: RE | Admit: 2022-12-09 | Discharge: 2022-12-09 | Disposition: A | Discharge status: Home / Self Care | Payer: Medicare HMO | Source: Ambulatory Visit | Attending: Cardiology | Admitting: Cardiology

## 2022-12-09 ENCOUNTER — Ambulatory Visit (INDEPENDENT_AMBULATORY_CARE_PROVIDER_SITE_OTHER): Payer: Medicare HMO

## 2022-12-09 DIAGNOSIS — I4891 Unspecified atrial fibrillation: Secondary | ICD-10-CM | POA: Insufficient documentation

## 2022-12-09 DIAGNOSIS — Z5181 Encounter for therapeutic drug level monitoring: Secondary | ICD-10-CM

## 2022-12-09 DIAGNOSIS — I6529 Occlusion and stenosis of unspecified carotid artery: Secondary | ICD-10-CM | POA: Diagnosis present

## 2022-12-09 LAB — POCT INR: INR: 2.9 (ref 2.0–3.0)

## 2022-12-09 NOTE — Patient Instructions (Signed)
Description   Continue taking Warfarin 1.5 tablets daily except for 1 tablet on Mondays, Wednesdays and Fridays.  Stay consistent with greens each week (1 per week).  Recheck INR 4 weeks.  Anticoagulation Clinic [phone]

## 2022-12-10 ENCOUNTER — Telehealth: Payer: Self-pay | Admitting: *Deleted

## 2022-12-10 NOTE — Telephone Encounter (Signed)
Pre-operative Risk Assessment    Patient Name: [name]  DOB: [birth year] MRN: [id number]   DATE OF LAST VISIT:  10/31/21 DR. HOCHREIN DATE OF NEXT VISIT: 01/07/23 DR. HOCHREIN   Request for Surgical Clearance    Procedure:   OPEN B/L INGUINAL HERNIA REPAIR WITH MESH  Date of Surgery:  Clearance TBD                                 Surgeon:  DR. Melody Haver Surgeon's Group or Practice Name:  [facility] Phone number:  [phone] Fax number:  [phone] ATTN: Trellis Moment, CMA   Type of Clearance Requested:   - Medical  - Pharmacy:  Hold Warfarin (Coumadin)     Type of Anesthesia:  General    Additional requests/questions:    [name]   12/10/2022, 12:19 PM

## 2022-12-10 NOTE — Telephone Encounter (Signed)
Primary Cardiologist: Rollene Rotunda, MD   Chart reviewed as part of pre-operative protocol coverage. Given past medical history and time since last visit, based on ACC/AHA guidelines, [name] is at acceptable risk for the planned procedure without further cardiovascular testing.    CHA2DS2-VASc Score = 7  This indicates a 11.2% annual risk of stroke. The patient's score is based upon: CHF History: 0 HTN History: 1 Diabetes History: 1 Stroke History: 2 Vascular Disease History: 1 Age Score: 1 Gender Score: 1   Remote history of TIA. Prior clearances have been reviewed with Dr Antoine Poche: "There is a very questionable history of any prior cerebrovascular event. I think that Lovenox bridging is not indicated."   CrCl 12mL/min using adjusted body weight Platelet count 272K   Per office protocol, patient can hold warfarin for 5 days prior to procedure. Patient will not need bridging with Lovenox around procedure.     The patient was advised that if she develops new symptoms prior to surgery to contact our office to arrange for a follow-up visit, and she verbalized understanding.   I will route this recommendation to the requesting party via Epic fax function and remove from pre-op pool.   Please call with questions.   Joni Reining, NP 11/12/2022, 4:50 PM

## 2022-12-14 ENCOUNTER — Encounter: Payer: Self-pay | Admitting: *Deleted

## 2022-12-16 NOTE — Progress Notes (Signed)
COVID Vaccine received:  [x]  No []  Yes Date of any COVID positive Test in last 90 days:  None  PCP - Andris Baumann, NP at [facility] in [place].  Cardiologist - Rollene Rotunda, MD   Joni Reining NP  Clearance note 12-10-2022   Epic  Chest x-ray - 03-24-2019  2v  Epic EKG -  10-06-2022  Epic Stress Test - Lexiscan 11-06-2019  Epic ECHO -  Cardiac Cath -   PCR screen: []  Ordered & Completed []   No Order but Needs PROFEND     [x]   N/A for this surgery  Surgery Plan:  [x]  Ambulatory   []  Outpatient in bed  []  Admit Anesthesia:    []  General  []  Spinal  []   Choice [x]   MAC  Bowel Prep - [x]  No  []   Yes ______  Pacemaker / ICD device [x]  No []  Yes   Spinal Cord Stimulator:[x]  No []  Yes       History of Sleep Apnea? []  No [x]  Yes   CPAP used?- [x]  No []  Yes    Does the patient monitor blood sugar?   []  N/A   [x]  No []  Yes  Patient has: []  NO Hx DM   []  Pre-DM   []  DM1  [x]   DM2 Last A1c was:  7.0   on  07-15-2021 in [facility]    Does patient have a [employer] or Dexacom? [x]  No []  Yes   Checks Blood Sugar 0_ times a day  Other Diabetic medications/ instructions: Metformin 500 mg bid  Blood Thinner / Instructions:Coumadin-  hold 5 days with no bridge per Lorin Picket, NP 12-10-2022 note Aspirin Instructions:  None  ERAS Protocol Ordered: [x]  No  []  Yes Patient is to be NPO after: Midnight prior  Dental hx: [x]  Dentures: Upper and Lower []  N/A      []  Bridge or Partial:                   []  Loose or Damaged teeth:   Activity level: Patient is unable to climb a flight of stairs without difficulty; [x]  No CP but would have SOB. Patient can perform ADLs without assistance.   Anesthesia review: DM2, Hx TIA, A.fib, HTN, GERD, mild OSA- No CPAP, anxiety, remote hx of seizures as a child.   Patient was lucid and appropriate with  her answers to my PST questions even though she has mild dementia per her records. Her husband was present and did not correct any of her  answers. She has been taking all  her medications as directed with no lapses.   Patient denies shortness of breath, fever, cough and chest pain at PAT appointment.  Patient verbalized understanding and agreement to the Pre-Surgical Instructions that were given to them at this PAT appointment. Patient was also educated of the need to review these PAT instructions again prior to her surgery.I reviewed the appropriate phone numbers to call if they have any and questions or concerns.

## 2022-12-17 ENCOUNTER — Other Ambulatory Visit: Payer: Self-pay

## 2022-12-17 ENCOUNTER — Ambulatory Visit: Payer: Self-pay | Admitting: General Surgery

## 2022-12-17 ENCOUNTER — Encounter (HOSPITAL_COMMUNITY): Payer: Self-pay

## 2022-12-17 ENCOUNTER — Encounter (HOSPITAL_COMMUNITY)
Admission: RE | Admit: 2022-12-17 | Discharge: 2022-12-17 | Disposition: A | Discharge status: Home / Self Care | Payer: Medicare HMO | Source: Ambulatory Visit | Attending: General Surgery | Admitting: General Surgery

## 2022-12-17 VITALS — BP 113/57 | HR 96 | Temp 98.0°F | Resp 18 | Ht 63.0 in | Wt 149.0 lb

## 2022-12-17 DIAGNOSIS — Z01818 Encounter for other preprocedural examination: Secondary | ICD-10-CM

## 2022-12-17 DIAGNOSIS — Z01812 Encounter for preprocedural laboratory examination: Secondary | ICD-10-CM | POA: Insufficient documentation

## 2022-12-17 DIAGNOSIS — I10 Essential (primary) hypertension: Secondary | ICD-10-CM | POA: Diagnosis not present

## 2022-12-17 DIAGNOSIS — E1121 Type 2 diabetes mellitus with diabetic nephropathy: Secondary | ICD-10-CM | POA: Insufficient documentation

## 2022-12-17 HISTORY — DX: Anemia, unspecified: D64.9

## 2022-12-17 HISTORY — DX: Anxiety disorder, unspecified: F41.9

## 2022-12-17 LAB — BASIC METABOLIC PANEL
Anion gap: 8 (ref 5–15)
BUN: 12 mg/dL (ref 8–23)
CO2: 24 mmol/L (ref 22–32)
Calcium: 9.3 mg/dL (ref 8.9–10.3)
Chloride: 104 mmol/L (ref 98–111)
Creatinine, Ser: 0.84 mg/dL (ref 0.44–1.00)
GFR, Estimated: >60 mL/min (ref >60)
Glucose, Bld: 100 mg/dL — ABNORMAL HIGH (ref 70–99)
Potassium: 4.1 mmol/L (ref 3.5–5.1)
Sodium: 136 mmol/L (ref 135–145)

## 2022-12-17 LAB — CBC
HCT: 36.7 % (ref 36.0–46.0)
Hemoglobin: 11.7 g/dL — ABNORMAL LOW (ref 12.0–15.0)
MCH: 30.5 pg (ref 26.0–34.0)
MCHC: 31.9 g/dL (ref 30.0–36.0)
MCV: 95.6 fL (ref 80.0–100.0)
Platelets: 237 10*3/uL (ref 150–400)
RBC: 3.84 MIL/uL — ABNORMAL LOW (ref 3.87–5.11)
RDW: 14.5 % (ref 11.5–15.5)
WBC: 5.8 10*3/uL (ref 4.0–10.5)
nRBC: 0 % (ref 0.0–0.2)

## 2022-12-17 LAB — GLUCOSE, CAPILLARY: Glucose-Capillary: 97 mg/dL (ref 70–99)

## 2022-12-17 LAB — HEMOGLOBIN A1C
Hgb A1c MFr Bld: 7.5 % — ABNORMAL HIGH (ref 4.8–5.6)
Mean Plasma Glucose: 168.55 mg/dL

## 2022-12-17 NOTE — Patient Instructions (Addendum)
SURGICAL WAITING ROOM VISITATION Patients having surgery or a procedure may have no more than 2 support people in the waiting area - these visitors may rotate in the visitor waiting room.   Due to an increase in RSV and influenza rates and associated hospitalizations, children ages 50 and under may not visit patients in [facility] hospitals. If the patient needs to stay at the hospital during part of their recovery, the visitor guidelines for inpatient rooms apply.  PRE-OP VISITATION  Pre-op nurse will coordinate an appropriate time for 1 support person to accompany the patient in pre-op.  This support person may not rotate.  This visitor will be contacted when the time is appropriate for the visitor to come back in the pre-op area.  Please refer to the [facility] website for the visitor guidelines for Inpatients (after your surgery is over and you are in a regular room).  You are not required to quarantine at this time prior to your surgery. However, you must do this: Hand Hygiene often Do NOT share personal items Notify your provider if you are in close contact with someone who has COVID or you develop fever 100.4 or greater, new onset of sneezing, cough, sore throat, shortness of breath or body aches.  If you test positive for Covid or have been in contact with anyone that has tested positive in the last 10 days please notify you surgeon.    Your procedure is scheduled on: Wednesday  December 30, 2022   Report to [facility] Main Entrance: [place] entrance where the [employer] is available.   Report to admitting at: 06:15    AM  Call this number if you have any questions or problems the morning of surgery [phone]  DO NOT EAT OR DRINK ANYTHING AFTER MIDNIGHT THE NIGHT PRIOR TO YOUR SURGERY / PROCEDURE.   FOLLOW  ANY ADDITIONAL PRE OP INSTRUCTIONS YOU RECEIVED FROM YOUR SURGEON'S OFFICE!!!   Oral Hygiene is also important to reduce your risk of infection.         Remember - BRUSH YOUR TEETH THE MORNING OF SURGERY WITH YOUR REGULAR TOOTHPASTE  Do NOT smoke after Midnight the night before surgery.  STOP TAKING all Vitamins, Herbs and supplements 1 week before your surgery.   Take ONLY these medicines the morning of surgery with A SIP OF WATER: Bupropion (Wellbutrin), Duloxetine (Cymbalta), atenolol (Tenormin), diltiazem  (Cardiazem), levothyroxine (Synthroid), and Omeprazole.  You may take EITHER Tylenol or Tramadol if needed for pain.   Coumadin-  hold 5 days with no bridge per Lorin Picket, NP   You may not have any metal on your body including hair pins, jewelry, and body piercing  Do not wear make-up, lotions, powders, perfumes  or deodorant  Do not wear nail polish including gel and S&S, artificial / acrylic nails, or any other type of covering on natural nails including finger and toenails. If you have artificial nails, gel coating, etc., that needs to be removed by a nail salon, Please have this removed prior to surgery. Not doing so may mean that your surgery could be cancelled or delayed if the Surgeon or anesthesia staff feels like they are unable to monitor you safely.   Do not shave 48 hours prior to surgery to avoid nicks in your skin which may contribute to postoperative infections.    Contacts, Hearing Aids, dentures or bridgework may not be worn into surgery. DENTURES WILL BE REMOVED PRIOR TO SURGERY PLEASE DO NOT APPLY "Poly  grip" OR ADHESIVES!!!  Patients discharged on the day of surgery will not be allowed to drive home.  Someone NEEDS to stay with you for the first 24 hours after anesthesia.  Do not bring your home medications to the hospital. The Pharmacy will dispense medications listed on your medication list to you during your admission in the Hospital.  Please read over the following fact sheets you were given: IF YOU HAVE QUESTIONS ABOUT YOUR PRE-OP INSTRUCTIONS, PLEASE CALL [phone].   [place] - Preparing for  Surgery Before surgery, you can play an important role.  Because skin is not sterile, your skin needs to be as free of germs as possible.  You can reduce the number of germs on your skin by washing with CHG (chlorahexidine gluconate) soap before surgery.  CHG is an antiseptic cleaner which kills germs and bonds with the skin to continue killing germs even after washing. Please DO NOT use if you have an allergy to CHG or antibacterial soaps.  If your skin becomes reddened/irritated stop using the CHG and inform your nurse when you arrive at Short Stay. Do not shave (including legs and underarms) for at least 48 hours prior to the first CHG shower.  You may shave your face/neck.  Please follow these instructions carefully:  1.  Shower with CHG Soap the night before surgery and the  morning of surgery.  2.  If you choose to wash your hair, wash your hair first as usual with your normal  shampoo.  3.  After you shampoo, rinse your hair and body thoroughly to remove the shampoo.                             4.  Use CHG as you would any other liquid soap.  You can apply chg directly to the skin and wash.  Gently with a scrungie or clean washcloth.  5.  Apply the CHG Soap to your body ONLY FROM THE NECK DOWN.   Do not use on face/ open                           Wound or open sores. Avoid contact with eyes, ears mouth and genitals (private parts).                       Wash face,  Genitals (private parts) with your normal soap.             6.  Wash thoroughly, paying special attention to the area where your  surgery  will be performed.  7.  Thoroughly rinse your body with warm water from the neck down.  8.  DO NOT shower/wash with your normal soap after using and rinsing off the CHG Soap.            9.  Pat yourself dry with a clean towel.            10.  Wear clean pajamas.            11.  Place clean sheets on your bed the night of your first shower and do not  sleep with pets.  ON THE DAY OF SURGERY  : Do not apply any lotions/deodorants the morning of surgery.  Please wear clean clothes to the hospital/surgery center.    FAILURE TO FOLLOW THESE INSTRUCTIONS MAY RESULT IN THE CANCELLATION OF YOUR SURGERY  PATIENT SIGNATURE_________________________________  NURSE SIGNATURE__________________________________  ________________________________________________________________________

## 2022-12-23 NOTE — Progress Notes (Signed)
Anesthesia Chart Review   Case: [id number] Date/Time: 12/30/22 0815   Procedure: BILATERAL OPEN INGUINAL HERNIA REPAIR WITH MESH (Bilateral) - CHOICE & TAP BLOCK   Anesthesia type: Choice   Pre-op diagnosis: Bilateral Inguinal Hernia   Location[facility] ROOM 01 / [facility]   Surgeons: Moise Boring, MD       DISCUSSION:72 y.o. never smoker with h/o HTN, OSA, hypothyroidism, chronic LBBB, atrial fibrillation on Coumadin, carotid stenosis (12/09/22 imaging with 1-39% stenosis bilaterally), DM II (A1C 7.5), bilateral inguinal hernia scheduled for above procedure 12/30/22 with Dr. Melody Haver.   Per cardiology preoperative evaluation 12/10/22, "Chart reviewed as part of pre-operative protocol coverage. Given past medical history and time since last visit, based on ACC/AHA guidelines, [name] is at acceptable risk for the planned procedure without further cardiovascular testing.    CHA2DS2-VASc Score = 7  This indicates a 11.2% annual risk of stroke. The patient's score is based upon: CHF History: 0 HTN History: 1 Diabetes History: 1 Stroke History: 2 Vascular Disease History: 1 Age Score: 1 Gender Score: 1   Remote history of TIA. Prior clearances have been reviewed with Dr Antoine Poche: "There is a very questionable history of any prior cerebrovascular event. I think that Lovenox bridging is not indicated."   CrCl 9mL/min using adjusted body weight Platelet count 272K   Per office protocol, patient can hold warfarin for 5 days prior to procedure. Patient will not need bridging with Lovenox around procedure."   VS: BP (!) 113/57 Comment: right arm sitting  Pulse 96   Temp 36.7 C (Oral)   Resp 18   Ht 5\' 3"  (1.6 m)   Wt 67.6 kg   LMP 06/03/1978   SpO2 99%   BMI 26.39 kg/m   PROVIDERS: Verdell Face, DO is PCP   Cardiologist - Rollene Rotunda, MD   LABS: Labs reviewed: Acceptable for surgery. (all labs ordered are listed, but only abnormal results are  displayed)  Labs Reviewed  HEMOGLOBIN A1C - Abnormal; Notable for the following components:      Result Value   Hgb A1c MFr Bld 7.5 (*)    All other components within normal limits  BASIC METABOLIC PANEL - Abnormal; Notable for the following components:   Glucose, Bld 100 (*)    All other components within normal limits  CBC - Abnormal; Notable for the following components:   RBC 3.84 (*)    Hemoglobin 11.7 (*)    All other components within normal limits  GLUCOSE, CAPILLARY     IMAGES: VAS [place] Carotis 12/09/22 Summary:  Right Carotid: Velocities in the right ICA are consistent with a 1-39%  stenosis.   Left Carotid: Velocities in the left ICA are consistent with a 1-39%  stenosis.   Vertebrals: Bilateral vertebral arteries demonstrate antegrade flow.  Subclavians: Normal flow hemodynamics were seen in bilateral subclavian               arteries.   EKG:   CV: Myocardial Perfusion 08/16/2019 The left ventricular ejection fraction is normal (55-65%). Nuclear stress EF: 61%. The study is normal. There was no ST segment deviation noted during stress. No ischemia. Past Medical History:  Diagnosis Date   Abnormal CT scan 03/16/2017   Anemia    Anxiety    Atrial fibrillation (HCC)    Bunion    Carotid stenosis    Carpal tunnel syndrome    mild    Cervical arthritis    Cholecystitis    s/p  cholecystectomy   Chronic back pain    Chronic gastritis    COVID-19 2020   Depression    Diabetic peripheral neuropathy (HCC)    Diarrhea 04/05/2015   Digoxin toxicity    08/2011   Diverticulitis    dx 04/08/14   Diverticulosis    Dysphagia    no documented strictures but responded positively to dilation in past.    Dysrhythmia    Atrial Fibrillation   Epileptic seizure, tonic (HCC)    .No meds since age of 65.   Family history of adverse reaction to anesthesia    "my mother may have"   Fatty liver    Fibromyalgia    GERD (gastroesophageal reflux disease)    H/O hiatal  hernia    S/P hernia repair   Hyperlipidemia    Hypertension    Hypothyroid    Internal hemorrhoid    Joint pain 05/08/2011   Left knee pain 12/19/2014   Migraine    Mild cognitive impairment    Muscle spasm of back 08/10/2013   Nonspecific (abnormal) findings on radiological and other examination of gastrointestinal tract 08/07/2011   OSA (obstructive sleep apnea) dx'd 2003   "can't sleep w/that mask; lost some weight; maybe that's helped" (04/26/2014)   Osteoarthritis    Pneumonia X 2   Right foot pain 08/03/2013   Sarcoidosis    Seizures (HCC)    as a child, they stopped after age 41   Skin neoplasm    Stroke (HCC)    Hx TIA   TIA (transient ischemic attack)    "don't know when I had it" (04/26/2014)   Transaminase or LDH elevation    Type II diabetes mellitus (HCC)    Ventral hernia s/p laparoscopic lysis of adhesions and hernia repair with mesh 07/29/11 08/01/2011    Past Surgical History:  Procedure Laterality Date   ABDOMINAL HYSTERECTOMY  1980's   partial   BILATERAL OOPHORECTOMY  1980's   "after partial hysterectomy"   BREAST BIOPSY Right    benign   BREAST EXCISIONAL BIOPSY Right 03/2006   benign   BREAST SURGERY Right ~ 2010   excision milk duct;   BUNIONECTOMY Bilateral    CATARACT EXTRACTION W/PHACO  10/27/2010   Procedure: CATARACT EXTRACTION PHACO AND INTRAOCULAR LENS PLACEMENT (IOC);  Surgeon: Susa Simmonds;  Location: [facility];  Service: Ophthalmology;  Laterality: Left;  CDE- 1.78   CATARACT EXTRACTION W/PHACO  07/13/2011   Procedure: CATARACT EXTRACTION PHACO AND INTRAOCULAR LENS PLACEMENT (IOC);  Surgeon: Susa Simmonds, MD;  Location: [facility];  Service: Ophthalmology;  Laterality: Right;  CDE:  1.65   DILATION AND CURETTAGE OF UTERUS  1970; 1976   ESOPHAGEAL DILATION  multiple   EYE MUSCLE SURGERY Bilateral 2022   FOOT SURGERY Left ~ 2011   "straightened toe & scraped bone below big toe"   FOOT SURGERY Right ~ 2011   "scraped bone of big toe;  shortened middle toet"   HIATAL HERNIA REPAIR  1990   "had to have scar tissue removed 6 months after repair"   Inverted nipples  1992   LAPAROSCOPIC CHOLECYSTECTOMY  1980's   LIPOSUCTION  1992   SALPINGOOPHORECTOMY Bilateral    "6 months or so after hysterectomy"   SKIN CANCER EXCISION  1990's   "front side of right shin"   TUBAL LIGATION  1976   VENTRAL HERNIA REPAIR  07/29/2011   Procedure: LAPAROSCOPIC VENTRAL HERNIA;  Surgeon: Ernestene Mention, MD;  Location: [facility];  Service: General;  Laterality: N/A;  multiple incarcerated hernias with mesh    MEDICATIONS:  ACCU-CHEK GUIDE test strip   acetaminophen (TYLENOL) 500 MG tablet   Alcohol Swabs (ALCOHOL WIPES) 70 % PADS   atenolol (TENORMIN) 25 MG tablet   Blood Glucose Calibration (MYGLUCOHEALTH CONTROL) SOLN   Blood Glucose Monitoring Suppl (ACCU-CHEK GUIDE ME) w/Device KIT   Blood Glucose Monitoring Suppl (GLUCOCOM BLOOD GLUCOSE MONITOR) DEVI   buPROPion (WELLBUTRIN XL) 150 MG 24 hr tablet   Cholecalciferol (VITAMIN D3) 50 MCG (2000 UT) TABS   Cyanocobalamin (VITAMIN B-12) 5000 MCG TBDP   dicyclomine (BENTYL) 10 MG capsule   diltiazem (CARDIZEM CD) 180 MG 24 hr capsule   DULoxetine (CYMBALTA) 60 MG capsule   glucose blood (PRECISION QID TEST) test strip   Lancet Devices (SIMPLE DIAGNOSTICS LANCING DEV) MISC   levothyroxine (SYNTHROID) 50 MCG tablet   losartan (COZAAR) 25 MG tablet   metFORMIN (GLUCOPHAGE-XR) 500 MG 24 hr tablet   omeprazole (PRILOSEC) 40 MG capsule   ondansetron (ZOFRAN-ODT) 4 MG disintegrating tablet   rosuvastatin (CRESTOR) 20 MG tablet   traMADol (ULTRAM) 50 MG tablet   warfarin (COUMADIN) 2.5 MG tablet   No current facility-administered medications for this encounter.    Jodell Cipro Ward, PA-C [facility] Pre-Surgical Testing [phone]

## 2022-12-23 NOTE — Anesthesia Preprocedure Evaluation (Addendum)
Anesthesia Evaluation  Patient identified by MRN, date of birth, ID band Patient awake    Reviewed: Allergy & Precautions, H&P , NPO status , Patient's Chart, lab work & pertinent test results  Airway Mallampati: II  TM Distance: >3 FB Neck ROM: Full    Dental no notable dental hx. (+) Edentulous Upper, Edentulous Lower, Dental Advisory Given   Pulmonary sleep apnea    Pulmonary exam normal breath sounds clear to auscultation       Cardiovascular Exercise Tolerance: Good hypertension, Pt. on medications and Pt. on home beta blockers + dysrhythmias Atrial Fibrillation  Rhythm:Irregular Rate:Normal     Neuro/Psych  Headaches, Seizures -, Well Controlled,   Anxiety Depression    TIACVA    GI/Hepatic Neg liver ROS, hiatal hernia,GERD  Medicated,,  Endo/Other  diabetes, Type 2, Oral Hypoglycemic AgentsHypothyroidism    Renal/GU negative Renal ROS  negative genitourinary   Musculoskeletal  (+) Arthritis , Osteoarthritis,  Fibromyalgia -  Abdominal   Peds  Hematology  (+) Blood dyscrasia, anemia   Anesthesia Other Findings   Reproductive/Obstetrics negative OB ROS                             Anesthesia Physical Anesthesia Plan  ASA: 3  Anesthesia Plan: General   Post-op Pain Management: Regional block* and Tylenol PO (pre-op)*   Induction: Intravenous  PONV Risk Score and Plan: 4 or greater and Ondansetron, Dexamethasone and Treatment may vary due to age or medical condition  Airway Management Planned: Oral ETT  Additional Equipment:   Intra-op Plan:   Post-operative Plan: Extubation in OR  Informed Consent: I have reviewed the patients History and Physical, chart, labs and discussed the procedure including the risks, benefits and alternatives for the proposed anesthesia with the patient or authorized representative who has indicated his/her understanding and acceptance.      Dental advisory given  Plan Discussed with: CRNA  Anesthesia Plan Comments: (See PAT note 12/17/2022, Christeen Douglas, PA-C)       Anesthesia Quick Evaluation

## 2022-12-30 ENCOUNTER — Ambulatory Visit (HOSPITAL_COMMUNITY): Payer: Medicare HMO | Admitting: Physician Assistant

## 2022-12-30 ENCOUNTER — Ambulatory Visit (HOSPITAL_COMMUNITY)
Admission: RE | Admit: 2022-12-30 | Discharge: 2022-12-30 | Disposition: A | Discharge status: Home / Self Care | Payer: Medicare HMO | Source: Ambulatory Visit | Attending: General Surgery | Admitting: General Surgery

## 2022-12-30 ENCOUNTER — Ambulatory Visit (HOSPITAL_COMMUNITY): Payer: Self-pay | Admitting: Certified Registered Nurse Anesthetist

## 2022-12-30 ENCOUNTER — Encounter (HOSPITAL_COMMUNITY)
Admission: RE | Disposition: A | Discharge status: Home / Self Care | Payer: Self-pay | Source: Ambulatory Visit | Attending: General Surgery

## 2022-12-30 ENCOUNTER — Other Ambulatory Visit: Payer: Self-pay

## 2022-12-30 ENCOUNTER — Encounter (HOSPITAL_COMMUNITY): Payer: Self-pay | Admitting: General Surgery

## 2022-12-30 DIAGNOSIS — M797 Fibromyalgia: Secondary | ICD-10-CM | POA: Diagnosis not present

## 2022-12-30 DIAGNOSIS — I10 Essential (primary) hypertension: Secondary | ICD-10-CM

## 2022-12-30 DIAGNOSIS — Z7901 Long term (current) use of anticoagulants: Secondary | ICD-10-CM | POA: Insufficient documentation

## 2022-12-30 DIAGNOSIS — K4020 Bilateral inguinal hernia, without obstruction or gangrene, not specified as recurrent: Secondary | ICD-10-CM

## 2022-12-30 DIAGNOSIS — Z01818 Encounter for other preprocedural examination: Secondary | ICD-10-CM

## 2022-12-30 DIAGNOSIS — E1142 Type 2 diabetes mellitus with diabetic polyneuropathy: Secondary | ICD-10-CM | POA: Diagnosis not present

## 2022-12-30 DIAGNOSIS — D869 Sarcoidosis, unspecified: Secondary | ICD-10-CM | POA: Insufficient documentation

## 2022-12-30 DIAGNOSIS — Z7984 Long term (current) use of oral hypoglycemic drugs: Secondary | ICD-10-CM | POA: Insufficient documentation

## 2022-12-30 DIAGNOSIS — G8929 Other chronic pain: Secondary | ICD-10-CM | POA: Insufficient documentation

## 2022-12-30 DIAGNOSIS — G4733 Obstructive sleep apnea (adult) (pediatric): Secondary | ICD-10-CM | POA: Insufficient documentation

## 2022-12-30 DIAGNOSIS — K219 Gastro-esophageal reflux disease without esophagitis: Secondary | ICD-10-CM | POA: Insufficient documentation

## 2022-12-30 DIAGNOSIS — E039 Hypothyroidism, unspecified: Secondary | ICD-10-CM | POA: Diagnosis not present

## 2022-12-30 DIAGNOSIS — Z79890 Hormone replacement therapy (postmenopausal): Secondary | ICD-10-CM | POA: Insufficient documentation

## 2022-12-30 DIAGNOSIS — M549 Dorsalgia, unspecified: Secondary | ICD-10-CM | POA: Insufficient documentation

## 2022-12-30 DIAGNOSIS — I4891 Unspecified atrial fibrillation: Secondary | ICD-10-CM | POA: Diagnosis not present

## 2022-12-30 DIAGNOSIS — E1121 Type 2 diabetes mellitus with diabetic nephropathy: Secondary | ICD-10-CM

## 2022-12-30 HISTORY — PX: INGUINAL HERNIA REPAIR: SHX194

## 2022-12-30 LAB — GLUCOSE, CAPILLARY
Glucose-Capillary: 133 mg/dL — ABNORMAL HIGH (ref 70–99)
Glucose-Capillary: 182 mg/dL — ABNORMAL HIGH (ref 70–99)
Glucose-Capillary: 167 mg/dL — ABNORMAL HIGH (ref 70–99)
Glucose-Capillary: 173 mg/dL — ABNORMAL HIGH (ref 70–99)

## 2022-12-30 SURGERY — REPAIR, HERNIA, INGUINAL, BILATERAL, ADULT
Anesthesia: General | Laterality: Bilateral

## 2022-12-30 MED ORDER — OXYCODONE HCL 5 MG PO TABS: 5.0000 mg | ORAL_TABLET | Freq: Three times a day (TID) | ORAL | 0 refills | Status: AC | PRN

## 2022-12-30 MED ORDER — SUGAMMADEX SODIUM 200 MG/2ML IV SOLN
INTRAVENOUS | Status: DC | PRN
  Administered 2022-12-30: 200 mg via INTRAVENOUS

## 2022-12-30 MED ORDER — FENTANYL CITRATE (PF) 100 MCG/2ML IJ SOLN
INTRAMUSCULAR | Status: AC
  Filled 2022-12-30: qty 2

## 2022-12-30 MED ORDER — CLINDAMYCIN PHOSPHATE 900 MG/50ML IV SOLN
900.0000 mg | INTRAVENOUS | Status: AC
  Administered 2022-12-30: 900 mg via INTRAVENOUS
  Filled 2022-12-30: qty 50

## 2022-12-30 MED ORDER — ESMOLOL HCL 100 MG/10ML IV SOLN
INTRAVENOUS | Status: DC | PRN
  Administered 2022-12-30: 30 mg via INTRAVENOUS
  Administered 2022-12-30 (×2): 20 mg via INTRAVENOUS

## 2022-12-30 MED ORDER — 0.9 % SODIUM CHLORIDE (POUR BTL) OPTIME
TOPICAL | Status: DC | PRN
  Administered 2022-12-30: 500 mL

## 2022-12-30 MED ORDER — KETAMINE HCL 50 MG/5ML IJ SOSY
PREFILLED_SYRINGE | INTRAMUSCULAR | Status: AC
  Filled 2022-12-30: qty 5

## 2022-12-30 MED ORDER — PROPOFOL 10 MG/ML IV BOLUS
INTRAVENOUS | Status: DC | PRN
  Administered 2022-12-30: 100 mg via INTRAVENOUS

## 2022-12-30 MED ORDER — BUPIVACAINE LIPOSOME 1.3 % IJ SUSP
INTRAMUSCULAR | Status: DC | PRN
  Administered 2022-12-30 (×2): 5 mL via PERINEURAL

## 2022-12-30 MED ORDER — SODIUM CHLORIDE 0.9% FLUSH: 3.0000 mL | INTRAVENOUS | Status: DC | PRN

## 2022-12-30 MED ORDER — BUPIVACAINE HCL (PF) 0.25 % IJ SOLN
INTRAMUSCULAR | Status: DC | PRN
  Administered 2022-12-30 (×2): 20 mL via PERINEURAL

## 2022-12-30 MED ORDER — ROCURONIUM BROMIDE 10 MG/ML (PF) SYRINGE
PREFILLED_SYRINGE | INTRAVENOUS | Status: DC | PRN
  Administered 2022-12-30: 60 mg via INTRAVENOUS
  Administered 2022-12-30: 20 mg via INTRAVENOUS

## 2022-12-30 MED ORDER — FENTANYL CITRATE PF 50 MCG/ML IJ SOSY
PREFILLED_SYRINGE | INTRAMUSCULAR | Status: AC
  Filled 2022-12-30: qty 1

## 2022-12-30 MED ORDER — CHLORHEXIDINE GLUCONATE CLOTH 2 % EX PADS: 6.0000 | MEDICATED_PAD | Freq: Once | CUTANEOUS | Status: DC

## 2022-12-30 MED ORDER — INSULIN ASPART 100 UNIT/ML IJ SOLN
0.0000 [IU] | INTRAMUSCULAR | Status: DC | PRN
Start: 2022-12-30 — End: 2022-12-30
  Administered 2022-12-30: 2 [IU] via SUBCUTANEOUS

## 2022-12-30 MED ORDER — ONDANSETRON HCL 4 MG/2ML IJ SOLN
INTRAMUSCULAR | Status: DC | PRN
  Administered 2022-12-30: 4 mg via INTRAVENOUS

## 2022-12-30 MED ORDER — FENTANYL CITRATE (PF) 100 MCG/2ML IJ SOLN
INTRAMUSCULAR | Status: DC | PRN
  Administered 2022-12-30 (×4): 50 ug via INTRAVENOUS

## 2022-12-30 MED ORDER — FENTANYL CITRATE PF 50 MCG/ML IJ SOSY
25.0000 ug | PREFILLED_SYRINGE | INTRAMUSCULAR | Status: DC | PRN
  Administered 2022-12-30: 25 ug via INTRAVENOUS

## 2022-12-30 MED ORDER — BUPIVACAINE-EPINEPHRINE (PF) 0.25% -1:200000 IJ SOLN
INTRAMUSCULAR | Status: DC | PRN
  Administered 2022-12-30: 20 mL

## 2022-12-30 MED ORDER — CHLORHEXIDINE GLUCONATE 0.12 % MT SOLN: 15.0000 mL | Freq: Once | OROMUCOSAL | Status: DC

## 2022-12-30 MED ORDER — OXYCODONE HCL 5 MG PO TABS
5.0000 mg | ORAL_TABLET | ORAL | Status: DC | PRN
  Administered 2022-12-30: 5 mg via ORAL

## 2022-12-30 MED ORDER — BUPIVACAINE LIPOSOME 1.3 % IJ SUSP
INTRAMUSCULAR | Status: AC
  Filled 2022-12-30: qty 20

## 2022-12-30 MED ORDER — BUPIVACAINE-EPINEPHRINE 0.25% -1:200000 IJ SOLN
INTRAMUSCULAR | Status: AC
Start: 2022-12-30 — End: ?
  Filled 2022-12-30: qty 1

## 2022-12-30 MED ORDER — PHENYLEPHRINE HCL-NACL 20-0.9 MG/250ML-% IV SOLN
INTRAVENOUS | Status: AC
  Filled 2022-12-30: qty 250

## 2022-12-30 MED ORDER — ACETAMINOPHEN 500 MG PO TABS
1000.0000 mg | ORAL_TABLET | Freq: Once | ORAL | Status: AC
  Administered 2022-12-30: 1000 mg via ORAL
  Filled 2022-12-30: qty 2

## 2022-12-30 MED ORDER — PROPOFOL 10 MG/ML IV BOLUS
INTRAVENOUS | Status: AC
  Filled 2022-12-30: qty 20

## 2022-12-30 MED ORDER — ACETAMINOPHEN 325 MG PO TABS: 650.0000 mg | ORAL_TABLET | Freq: Four times a day (QID) | ORAL | 0 refills | Status: AC

## 2022-12-30 MED ORDER — ORAL CARE MOUTH RINSE: 15.0000 mL | Freq: Once | OROMUCOSAL | Status: DC

## 2022-12-30 MED ORDER — ACETAMINOPHEN 325 MG PO TABS: 650.0000 mg | ORAL_TABLET | ORAL | Status: DC | PRN

## 2022-12-30 MED ORDER — DEXAMETHASONE SODIUM PHOSPHATE 4 MG/ML IJ SOLN
INTRAMUSCULAR | Status: DC | PRN
  Administered 2022-12-30: 4 mg via INTRAVENOUS

## 2022-12-30 MED ORDER — LACTATED RINGERS IV SOLN: INTRAVENOUS | Status: DC

## 2022-12-30 MED ORDER — OXYCODONE HCL 5 MG PO TABS
ORAL_TABLET | ORAL | Status: AC
  Filled 2022-12-30: qty 1

## 2022-12-30 MED ORDER — SODIUM CHLORIDE 0.9% FLUSH: 3.0000 mL | Freq: Two times a day (BID) | INTRAVENOUS | Status: DC

## 2022-12-30 MED ORDER — SODIUM CHLORIDE 0.9% FLUSH
10.0000 mL | Freq: Two times a day (BID) | INTRAVENOUS | Status: DC
Start: 2022-12-30 — End: 2022-12-30

## 2022-12-30 MED ORDER — LIDOCAINE HCL (PF) 2 % IJ SOLN
INTRAMUSCULAR | Status: DC | PRN
  Administered 2022-12-30: 1.5 mg/kg/h via INTRADERMAL

## 2022-12-30 MED ORDER — ACETAMINOPHEN 650 MG RE SUPP: 650.0000 mg | RECTAL | Status: DC | PRN

## 2022-12-30 MED ORDER — IBUPROFEN 200 MG PO TABS: 600.0000 mg | ORAL_TABLET | Freq: Four times a day (QID) | ORAL | 0 refills | Status: AC

## 2022-12-30 MED ORDER — MIDAZOLAM HCL 2 MG/2ML IJ SOLN
INTRAMUSCULAR | Status: AC
  Filled 2022-12-30: qty 2

## 2022-12-30 SURGICAL SUPPLY — 50 items
ADH SKN CLS APL DERMABOND .7 (GAUZE/BANDAGES/DRESSINGS) ×1
APL PRP STRL LF DISP 70% ISPRP (MISCELLANEOUS) ×1
BAG COUNTER SPONGE SURGICOUNT (BAG) IMPLANT
BAG SPNG CNTER NS LX DISP (BAG)
BLADE SURG 15 STRL LF DISP TIS (BLADE) ×2 IMPLANT
BLADE SURG 15 STRL SS (BLADE) ×1
BLADE SURG SZ10 CARB STEEL (BLADE) IMPLANT
CHLORAPREP W/TINT 26 (MISCELLANEOUS) ×2 IMPLANT
COVER SURGICAL LIGHT HANDLE (MISCELLANEOUS) ×2 IMPLANT
DERMABOND ADVANCED .7 DNX12 (GAUZE/BANDAGES/DRESSINGS) ×2 IMPLANT
DISSECTOR ROUND CHERRY 3/8 STR (MISCELLANEOUS) IMPLANT
DRAIN PENROSE 0.5X18 (DRAIN) IMPLANT
DRAIN PENROSE LF 8X20.3CM SIL (WOUND CARE) IMPLANT
DRAPE LAPAROSCOPIC ABDOMINAL (DRAPES) ×2 IMPLANT
DRAPE LAPAROTOMY TRNSV 102X78 (DRAPES) ×2 IMPLANT
DRAPE UTILITY XL STRL (DRAPES) ×2 IMPLANT
ELECT PENCIL ROCKER SW 15FT (MISCELLANEOUS) ×2 IMPLANT
ELECT REM PT RETURN 15FT ADLT (MISCELLANEOUS) ×2 IMPLANT
GAUZE 4X4 16PLY ~~LOC~~+RFID DBL (SPONGE) ×2 IMPLANT
GLOVE BIO SURGEON STRL SZ7 (GLOVE) ×2 IMPLANT
GLOVE INDICATOR 7.5 STRL GRN (GLOVE) ×2 IMPLANT
GOWN STRL REUS W/ TWL XL LVL3 (GOWN DISPOSABLE) ×2 IMPLANT
GOWN STRL REUS W/TWL XL LVL3 (GOWN DISPOSABLE) ×1
KIT BASIN OR (CUSTOM PROCEDURE TRAY) ×2 IMPLANT
KIT TURNOVER KIT A (KITS) IMPLANT
MARKER SKIN DUAL TIP RULER LAB (MISCELLANEOUS) ×2 IMPLANT
MESH BARD SOFT 3X6IN (Mesh General) IMPLANT
NDL HYPO 25X1 1.5 SAFETY (NEEDLE) ×2 IMPLANT
NEEDLE HYPO 25X1 1.5 SAFETY (NEEDLE) ×1
PACK BASIC VI WITH GOWN DISP (CUSTOM PROCEDURE TRAY) ×2 IMPLANT
SPIKE FLUID TRANSFER (MISCELLANEOUS) ×2 IMPLANT
SUT MNCRL AB 4-0 PS2 18 (SUTURE) ×4 IMPLANT
SUT PDS AB 0 CT1 36 (SUTURE) IMPLANT
SUT PDS AB 2-0 CT2 27 (SUTURE) ×6 IMPLANT
SUT PROLENE 0 CT 2 (SUTURE) IMPLANT
SUT PROLENE 2 0 SH DA (SUTURE) ×6 IMPLANT
SUT SILK 2 0 (SUTURE) ×1
SUT SILK 2-0 18XBRD TIE 12 (SUTURE) IMPLANT
SUT SILK 3 0 (SUTURE)
SUT SILK 3-0 18XBRD TIE 12 (SUTURE) IMPLANT
SUT VIC AB 2-0 SH 27 (SUTURE) ×1
SUT VIC AB 2-0 SH 27X BRD (SUTURE) IMPLANT
SUT VIC AB 3-0 SH 18 (SUTURE) ×4 IMPLANT
SUT VIC AB 3-0 SH 27 (SUTURE) ×1
SUT VIC AB 3-0 SH 27XBRD (SUTURE) ×8 IMPLANT
SYR 20ML LL LF (SYRINGE) ×2 IMPLANT
SYR BULB IRRIG 60ML STRL (SYRINGE) IMPLANT
TOWEL OR 17X26 10 PK STRL BLUE (TOWEL DISPOSABLE) ×2 IMPLANT
TOWEL OR NON WOVEN STRL DISP B (DISPOSABLE) ×2 IMPLANT
YANKAUER SUCT BULB TIP 10FT TU (MISCELLANEOUS) ×2 IMPLANT

## 2022-12-30 NOTE — Anesthesia Procedure Notes (Signed)
Anesthesia Regional Block: TAP block   Pre-Anesthetic Checklist: , timeout performed,  Correct Patient, Correct Site, Correct Laterality,  Correct Procedure, Correct Position, site marked,  Risks and benefits discussed,  Pre-op evaluation,  At surgeon's request and post-op pain management  Laterality: Left and Right  Prep: Maximum Sterile Barrier Precautions used, chloraprep       Needles:  Injection technique: Single-shot  Needle Type: Echogenic Stimulator Needle     Needle Length: 9cm  Needle Gauge: 21     Additional Needles:   Narrative:  Start time: 12/30/2022 7:10 AM End time: 12/30/2022 7:20 AM Injection made incrementally with aspirations every 5 mL.  Performed by: Personally  Anesthesiologist: Gaynelle Adu, MD  Additional Notes: Bilaterel TAP blocks.

## 2022-12-30 NOTE — H&P (Signed)
[name] 1950-08-11  [id number].    HPI:  72 y/o F w/ a hx of prior ventral hernia repair with mesh who presents for elective repair of bilateral inguinal hernias. She is in her usual state of health and denies and recent hospitalizations or changes in medications.   ROS: Review of Systems  Constitutional: Negative.   HENT: Negative.    Eyes: Negative.   Respiratory: Negative.    Cardiovascular: Negative.   Gastrointestinal: Negative.   Genitourinary: Negative.   Musculoskeletal: Negative.   Skin: Negative.   Neurological: Negative.   Endo/Heme/Allergies: Negative.   Psychiatric/Behavioral: Negative.      Family History  Problem Relation Age of Onset   Crohn's disease Mother    Colitis Mother        Crohns   Anesthesia problems Mother    Arthritis Mother        rheumatoid; maternal side of family also with RA   Pneumonia Mother 54       died with this   Diabetes Father    Heart disease Father    Melanoma Father    Coronary artery disease Father    Skin cancer Father    Stroke Father    Diabetes Sister    Migraines Sister    Depression Maternal Uncle    Dementia Maternal Uncle    Colon cancer Paternal Uncle        ? if stomach or colon    Other Daughter 89       Black cancer tumor in her heart, caused a heart attack   Hypotension Neg Hx    Malignant hyperthermia Neg Hx    Pseudochol deficiency Neg Hx    Esophageal cancer Neg Hx    Rectal cancer Neg Hx    Stomach cancer Neg Hx     Past Medical History:  Diagnosis Date   Abnormal CT scan 03/16/2017   Anemia    Anxiety    Atrial fibrillation (HCC)    Bunion    Carotid stenosis    Carpal tunnel syndrome    mild    Cervical arthritis    Cholecystitis    s/p cholecystectomy   Chronic back pain    Chronic gastritis    COVID-19 2020   Depression    Diabetic peripheral neuropathy (HCC)    Diarrhea 04/05/2015   Digoxin toxicity    08/2011   Diverticulitis    dx 04/08/14    Diverticulosis    Dysphagia    no documented strictures but responded positively to dilation in past.    Dysrhythmia    Atrial Fibrillation   Epileptic seizure, tonic (HCC)    .No meds since age of 30.   Family history of adverse reaction to anesthesia    "my mother may have"   Fatty liver    Fibromyalgia    GERD (gastroesophageal reflux disease)    H/O hiatal hernia    S/P hernia repair   Hyperlipidemia    Hypertension    Hypothyroid    Internal hemorrhoid    Joint pain 05/08/2011   Left knee pain 12/19/2014   Migraine    Mild cognitive impairment    Muscle spasm of back 08/10/2013   Nonspecific (abnormal) findings on radiological and other examination of gastrointestinal tract 08/07/2011   OSA (obstructive sleep apnea) dx'd 2003   "can't sleep w/that mask; lost some weight; maybe that's helped" (04/26/2014)   Osteoarthritis    Pneumonia X 2  Right foot pain 08/03/2013   Sarcoidosis    Seizures (HCC)    as a child, they stopped after age 33   Skin neoplasm    Stroke [facility])    Hx TIA   TIA (transient ischemic attack)    "don't know when I had it" (04/26/2014)   Transaminase or LDH elevation    Type II diabetes mellitus (HCC)    Ventral hernia s/p laparoscopic lysis of adhesions and hernia repair with mesh 07/29/11 08/01/2011    Past Surgical History:  Procedure Laterality Date   ABDOMINAL HYSTERECTOMY  1980's   partial   BILATERAL OOPHORECTOMY  1980's   "after partial hysterectomy"   BREAST BIOPSY Right    benign   BREAST EXCISIONAL BIOPSY Right 03/2006   benign   BREAST SURGERY Right ~ 2010   excision milk duct;   BUNIONECTOMY Bilateral    CATARACT EXTRACTION W/PHACO  10/27/2010   Procedure: CATARACT EXTRACTION PHACO AND INTRAOCULAR LENS PLACEMENT (IOC);  Surgeon: Susa Simmonds;  Location: [facility];  Service: Ophthalmology;  Laterality: Left;  CDE- 1.78   CATARACT EXTRACTION W/PHACO  07/13/2011   Procedure: CATARACT EXTRACTION PHACO AND INTRAOCULAR LENS  PLACEMENT (IOC);  Surgeon: Susa Simmonds, MD;  Location: [facility];  Service: Ophthalmology;  Laterality: Right;  CDE:  1.65   DILATION AND CURETTAGE OF UTERUS  1970; 1976   ESOPHAGEAL DILATION  multiple   EYE MUSCLE SURGERY Bilateral 2022   FOOT SURGERY Left ~ 2011   "straightened toe & scraped bone below big toe"   FOOT SURGERY Right ~ 2011   "scraped bone of big toe; shortened middle toet"   HIATAL HERNIA REPAIR  1990   "had to have scar tissue removed 6 months after repair"   Inverted nipples  1992   LAPAROSCOPIC CHOLECYSTECTOMY  1980's   LIPOSUCTION  1992   SALPINGOOPHORECTOMY Bilateral    "6 months or so after hysterectomy"   SKIN CANCER EXCISION  1990's   "front side of right shin"   TUBAL LIGATION  1976   VENTRAL HERNIA REPAIR  07/29/2011   Procedure: LAPAROSCOPIC VENTRAL HERNIA;  Surgeon: Ernestene Mention, MD;  Location: [facility];  Service: General;  Laterality: N/A;  multiple incarcerated hernias with mesh    Social History:  reports that she has never smoked. She has never used smokeless tobacco. She reports that she does not drink alcohol and does not use drugs.  Allergies:  Allergies  Allergen Reactions   Gemfibrozil Swelling    REACTION: Angioedema   Latex Other (See Comments)    Blisters where touched or applied   Penicillins Hives    Will spread in patches all over the body. Did it involve swelling of the face/tongue/throat, SOB, or low BP? No Did it involve sudden or severe rash/hives, skin peeling, or any reaction on the inside of your mouth or nose? No Did you need to seek medical attention at a hospital or doctor's office? Yes When did it last happen?       If all above answers are "NO", may proceed with cephalosporin use.    Adhesive [Tape] Other (See Comments)    Will blister skin where applied - do not use BAND-AIDS.   Codeine Hives    Will spread in patches all over the body.   Digoxin And Related     Makes BP drop   Metformin And Related Diarrhea    Omeprazole Diarrhea   Ace Inhibitors Rash    Cough and  rash     Medications Prior to Admission  Medication Sig Dispense Refill   ACCU-CHEK GUIDE test strip      acetaminophen (TYLENOL) 500 MG tablet Take 500-1,000 mg by mouth every 6 (six) hours as needed for mild pain or headache.     Alcohol Swabs (ALCOHOL WIPES) 70 % PADS Use 1 swab as needed to clean site for blood sugar check.     atenolol (TENORMIN) 25 MG tablet Take 1.5 tablets (37.5 mg total) by mouth daily. 135 tablet 3   Blood Glucose Calibration (MYGLUCOHEALTH CONTROL) SOLN      Blood Glucose Monitoring Suppl (ACCU-CHEK GUIDE ME) w/Device KIT daily.     Blood Glucose Monitoring Suppl (GLUCOCOM BLOOD GLUCOSE MONITOR) DEVI      buPROPion (WELLBUTRIN XL) 150 MG 24 hr tablet Take 150 mg by mouth daily.     Cholecalciferol (VITAMIN D3) 50 MCG (2000 UT) TABS Take 2,000 Int'l Units by mouth daily.     Cyanocobalamin (VITAMIN B-12) 5000 MCG TBDP Take 5,000 mcg by mouth daily.     dicyclomine (BENTYL) 10 MG capsule Take 1 capsule (10 mg total) by mouth 3 (three) times daily as needed (diarrhea and urgency). 90 capsule 2   diltiazem (CARDIZEM CD) 180 MG 24 hr capsule Take 1 capsule (180 mg total) by mouth daily. 90 capsule 3   DULoxetine (CYMBALTA) 60 MG capsule Take 1 capsule (60 mg total) by mouth daily. 90 capsule 3   levothyroxine (SYNTHROID) 50 MCG tablet Take 1 tablet (50 mcg total) by mouth every morning. 90 tablet 3   losartan (COZAAR) 25 MG tablet Take 1 tablet (25 mg total) by mouth daily. 90 tablet 3   metFORMIN (GLUCOPHAGE-XR) 500 MG 24 hr tablet TAKE 2 TABLETS(1000 MG) BY MOUTH TWICE DAILY WITH FOOD 360 tablet 1   omeprazole (PRILOSEC) 40 MG capsule Take 40 mg by mouth daily.     rosuvastatin (CRESTOR) 20 MG tablet Take 1 tablet (20 mg total) by mouth daily. 90 tablet 3   traMADol (ULTRAM) 50 MG tablet Take 50 mg by mouth every 6 (six) hours as needed for moderate pain.     warfarin (COUMADIN) 2.5 MG tablet TAKE 1 TO 1 AND  1/2 TABLETS BY MOUTH DAILY AS DIRECTED BY COUMADIN CLINIC (Patient taking differently: Take 2.5-3.75 mg by mouth See admin instructions. Take 2.5 mg by mouth on Monday and take 3.75 on Tuesday-Sunday) 45 tablet 1   glucose blood (PRECISION QID TEST) test strip Use 1 strip to check blood sugar once a day. Please dispense based on availability, insurance coverage, and patient preference. (Patient not taking: Reported on 03/04/2022)     Lancet Devices (SIMPLE DIAGNOSTICS LANCING DEV) MISC Use to lance skin to check blood sugar levels once a day.  Please dispense based on availability, insurance coverage, and patient preference. (Patient not taking: Reported on 03/04/2022)     ondansetron (ZOFRAN-ODT) 4 MG disintegrating tablet Take 4 mg by mouth every 8 (eight) hours as needed for vomiting or nausea.      Physical Exam: Blood pressure 129/86, pulse 84, temperature 98.3 F (36.8 C), temperature source Oral, resp. rate 16, height 5\' 3"  (1.6 m), weight 67.6 kg, last menstrual period 06/03/1978, SpO2 100%. Gen: female resting in bed, NAD Groin: bilateral bulges c/w hernias  Results for orders placed or performed during the hospital encounter of 12/30/22 (from the past 48 hour(s))  Glucose, capillary     Status: Abnormal   Collection Time: 12/30/22  5:50 AM  Result Value Ref Range   Glucose-Capillary 133 (H) 70 - 99 mg/dL    Comment: Glucose reference range applies only to samples taken after fasting for at least 8 hours.   Comment 1 Notify RN    *Note: Due to a large number of results and/or encounters for the requested time period, some results have not been displayed. A complete set of results can be found in Results Review.   No results found.  Assessment/Plan 72 y/o F presenting for elective bilateral inguinal hernia repair who is in her usual state of health  - Will proceed to the OR - Tentative plan for DC from PACU  Lucilla Edin [facility] Surgery 12/30/2022, 7:05  AM Please see Amion for pager number during day hours 7:00am-4:30pm or 7:00am -11:30am on weekends

## 2022-12-30 NOTE — Anesthesia Procedure Notes (Signed)
Procedure Name: Intubation Date/Time: 12/30/2022 7:41 AM  Performed by: Ludwig Lean, CRNAPre-anesthesia Checklist: Patient identified, Emergency Drugs available, Suction available and Patient being monitored Patient Re-evaluated:Patient Re-evaluated prior to induction Oxygen Delivery Method: Circle system utilized Preoxygenation: Pre-oxygenation with 100% oxygen Induction Type: IV induction Ventilation: Mask ventilation without difficulty Laryngoscope Size: Glidescope and 3 Grade View: Grade I Tube type: Oral Tube size: 7.0 mm Number of attempts: 1 Airway Equipment and Method: Stylet, Rigid stylet and Video-laryngoscopy Placement Confirmation: ETT inserted through vocal cords under direct vision, positive ETCO2 and breath sounds checked- equal and bilateral Secured at: 18 cm Tube secured with: Tape Dental Injury: Teeth and Oropharynx as per pre-operative assessment  Comments: Elective glidescope use, previous intubation with grade 3 views by CRNA and MDA  with mac and miller blades and then glidescope used. Easy mask, grade 1 view with S3 glidescope blade. AOI, taped at 18 cm bil breath sounds + & =

## 2022-12-30 NOTE — Transfer of Care (Signed)
Immediate Anesthesia Transfer of Care Note  Patient: [name]  Procedure(s) Performed: Procedure(s) with comments: BILATERAL OPEN INGUINAL HERNIA REPAIR WITH MESH (Bilateral) - CHOICE & TAP BLOCK  Patient Location: PACU  Anesthesia Type:General and Regional  Level of Consciousness: Patient easily awoken, comfortable, cooperative, following commands, responds to stimulation.   Airway & Oxygen Therapy: Patient spontaneously breathing, ventilating well, oxygen via simple oxygen mask.  Post-op Assessment: Report given to PACU RN, vital signs reviewed and stable, moving all extremities.   Post vital signs: Reviewed and stable.  Complications: No apparent anesthesia complications  Last Vitals:  Vitals Value Taken Time  BP 152/82 12/30/22 1006  Temp    Pulse 97 12/30/22 1009  Resp 19 12/30/22 1009  SpO2 100 % 12/30/22 1009  Vitals shown include unfiled device data.  Last Pain:  Vitals:   12/30/22 0613  TempSrc:   PainSc: 0-No pain      Patients Stated Pain Goal: 4 (12/30/22 1610)  Complications: No notable events documented.

## 2022-12-30 NOTE — Op Note (Addendum)
Post-Op Note/Post-Procedure Note  Patient: [name] MRN: [id number] DOB: 01-22-1951 Sex: female Operation/Procedure Date: 12/30/2022 Surgeons and Role:    * Hillery Hunter Lucilla Edin, MD - Primary  Pre-operative Diagnoses: Bilateral Inguinal Hernia Postoperative Diagnoses: Bilateral Inguinal Hernia  Procedure performed: Procedures:   * BILATERAL OPEN INGUINAL HERNIA REPAIR WITH MESH Side: Bilateral   Anesthesia: General Indications: [name] is a 72 y.o. year old female with a history of prior ventral hernia repair with mesh.  She presented to clinic with symptomatic bilateral inguinal hernias, and I offered her an open repair. Preoperatively, I discussed in detail the risks, benefits, alternatives, and potential complications. The patient understands and requests to proceed.  Operative Findings: Large bilateral direct inguinal hernias with attenuation of the inguinal floor and bulging of pre-peritoneal fat.  Technique: The patient was positively identified, was taken to the operating room and placed supine on the operating table. A foley catheter was place. The arms were carefully left out and padded. After establishment of deep sedation by the anesthesia team, the lower abdomen was prepped and draped in the usual sterile surgical fashion. A time-out was performed confirming correct patient and procedure. We identified the left anterosuperior iliac spine and the left pubic tubercle. We then made a 6-cm oblique incision just above the left pubic tubercle. This was done with #15 blade and the subcutaneous tissues were divided. We then incised the Scarpa's fascia and soon encountered the external oblique aponeurosis.The external oblique was very attenuated.  A #15 blade was used to incise the external oblique aponeurosis and this was divided obliterating the external ring. The division of the external oblique was taken up cephalad for an additional 3 cm. Care was taken not to  injure the left ilioinguinal nerve and left iliohypogastric nerve, which were identified and kept away from our dissection. The round ligament was identified and controlled with a Penrose drain. There was an obvious defect in the floor. I exposed the bulging preperitoneal fat herniating through the defect. This was cleared off circumferentially. She had no obvious femoral hernia palpated. I reapproximated the floor with interrupted 0-Prolene suture. Given the large defect, the decision was made to sacrifice the round ligament in order to allow for a better position of the mesh.  The ligament was suture ligated using 2-0 silk suture. Then, we brought Bard Softmesh 15cm x 7cm and placed this as an onlay in a modified Lichtenstein fashion using running 2-0 Prolene suture inferiorly along the shelving edge of the inguinal ligament. Superiorly, we placed interrupted 2-0 PDS suture fixating the mesh to the conjoint tendon taking care not to injure or entrap nervous structures. The entire mesh and tails were tucked up under the external oblique aponeurosis. We closed the external oblique aponeurosis with running 2-0 Vicryl suture. In doing this, we created a new external ring. Scarpa's layer was closed using running 3-0 Vicryl suture. In addition, we closed the deep dermal layers using interrupted 3-0 Vicryl suture. A 4-0 Monocryl running subcuticular suture was used to close the skin. Final dressing of Dermabond was placed.  I then repeated the same steps on the right side.  Findings on the right were notable for a large defect in the floor with bulging of preperitoneal fat.  The round ligament was again sacrificed to allow placement of the mesh.   All sponge, needle, and instrument counts were reported correct at the end of the case. The foley catheter was removed at the end of the case. She was  awoken by anesthesia and taken to PACU in stable condition.  Estimated Blood Loss: Minimal. Specimens: Implants:   Implant Name Type Inv. Item Serial No. Manufacturer Lot No. LRB No. Used Action  MESH BARD SOFT 3X6IN - [id number] Mesh General MESH BARD SOFT Geni Bers BARD ACCESS [id number] Right 1 Implanted  MESH BARD SOFT 3X6IN - [id number] Mesh General MESH BARD SOFT 3X6IN  [employer] BARD ACCESS [id number] Left 1 Implanted   Drains: None. Complications: None Condition of the patient: Good, emerged from sedation Disposition: PACU  Moise Boring Date: 12/30/2022 Time: 9:56 AM

## 2022-12-30 NOTE — Anesthesia Postprocedure Evaluation (Signed)
Anesthesia Post Note  Patient: [name]  Procedure(s) Performed: BILATERAL OPEN INGUINAL HERNIA REPAIR WITH MESH (Bilateral)     Patient location during evaluation: PACU Anesthesia Type: General and Regional Level of consciousness: awake and alert Pain management: pain level controlled Vital Signs Assessment: post-procedure vital signs reviewed and stable Respiratory status: spontaneous breathing, nonlabored ventilation and respiratory function stable Cardiovascular status: blood pressure returned to baseline and stable Postop Assessment: no apparent nausea or vomiting Anesthetic complications: no  No notable events documented.  Last Vitals:  Vitals:   12/30/22 1045 12/30/22 1100  BP: 124/66 119/72  Pulse: (!) 104 (!) 101  Resp: 12 16  Temp: 36.8 C   SpO2: 94% 97%    Last Pain:  Vitals:   12/30/22 1100  TempSrc:   PainSc: 7                  Thurston Brendlinger,W. EDMOND

## 2022-12-30 NOTE — Discharge Instructions (Signed)
Home Care After Hernia Repair   Activity  Limit activity for the first 24 hours, then you may return to normal daily activities. Returning to normal daily activities as soon as you can following surgery will enhance recovery time.  No heavy lifting pushing or pulling, anything heavier than 10 pounds (gallon of milk weighs approx. 8.8 pounds) for 4-6 weeks from surgery date.  Do not mow the lawn, use a vacuum cleaner, or do any other strenuous activities without first consulting your surgical team.  Climb stairs slowly and watch your step.  Walk as often as you feel able to increase strength and endurance.  No driving or operating heavy machinery within 24 hours of taking narcotic pain medication.  Abdominal Core Rehab if referral placed for you.  If you think you could benefit from rehab please talk with your surgeon.  For additional information about your recovery and activities you can do to help your recover, please download the [place] [facility]) app to your phone and follow the instructions.  The app can be found by clicking one of the links below, by entering [facility]' in your Appstore, or by opening your camera app and following one of the links in the QR codes below:  Diet  Drink plenty of fluids and eat a light meal on the night of surgery. Some patients may find their appetite is poor for a week or two after surgery. This is a normal result of the stress of surgery-your appetite will return in time.   There are no specific diet restrictions after surgery.  Dressings and Wound Care  Keep your wound or incision site clean and dry.  You may have different types of dressings covering your incisions depending on your operation and your surgeon: o Dermabond/Durabond (skin glue): This will usually remain in place for 10-14 days, then naturally fall off your skin. You may take a shower 24 hrs after surgery, carefully wash, not scrub the incision site with a mild  non-scented soap. Pat dry with a soft towel.  Do not pick or peel skin glue off..  Do not use creams, powder, salves or balms on your incision(s).    What to Expect After Surgery   Moderate discomfort controlled with medications  Minimal drainage from incision  You may feel pain in one or both shoulders. This pain comes from the gas still left in your belly after the surgery, if you had laparoscopic surgery (several small incisions). The pain should ease over several days to a week. Ambulation will help with this pain.   Belly swelling  Feeling fatigue and weak  Constipation after surgery is common. Drink plenty fluids and eat a high fiber diet.  Swelling - In some patients might feel that their hernia has returned after surgery-DO NOT Worry this is normal. Swelling may be due to the development of a seroma. Seroma is fluid that has built up where the hernia was repaired this is a normal result after surgery and it will slowly reabsorb back into your body over the next several weeks.   Pain Control: Prescribed Non-Narcotic Pain Medication  You will be given three prescriptions.  Two of them will be for prescription strength ibuprofen (i.e. Advil) and prescription strength acetaminophen (i.e. Tylenol).  The vast majority of patients will just need these two medications.  One prescription will be for a 'rescue' prescription of an oral narcotic (oxycodone).  You may fill this if needed.  You will alternate taking the  ibuprofen (600mg ) every 6 hours and also the acetaminophen (650mg ) every 6 hours so that you are taking one of those medications every 3 hours.  For example: o 0800 - take ibuprofen 600mg  o 1100 - take acetaminophen 650mg  o 1400 - take ibuprofen 600mg  o 1700 - take acetaminophen 650mg  o Etc.  Continue taking this alternating pattern of ibuprofen and acetaminophen for 3 days  If you cannot take one or the other of these medications, just take the one you can every 6 hours.  If you  are comfortable at night, you don't have to wake up and take a medication.  If you are still uncomfortable after taking either ibuprofen or acetaminophen, try gentle stretching exercise and ice packs (a bag of frozen vegetables works great).  If you are still uncomfortable, you may fill the narcotic prescription of Oxycodone and take as directed.  Once you have completed these prescriptions, your pain level should be low enough to stop taking medications altogether or just use an over the counter medication (ibuprofen or acetaminophen) as needed.   Pain Control: Over the Counter Medications to take as needed:  Colace/Docusate: May be prescribed by your surgeon to prevent constipation caused by the combination of narcotics, effects of anesthesia, and decreased ambulation.  Hold for loose stools or diarrhea. Take 100 mg 1-2 times a day starting tonight.   Fiber: High fiber foods, extra liquids (water 9-13 cups/day) can also assist with constipation. Examples of high fiber foods are fruit, bran. Prune juice and water are also good liquids to drink.  Milk of Magnesia/Miralax:  If constipated despite take the over the counter stool softeners, you may take Milk of Magnesia or Miralax as directed on bottle to assist with constipation.     Pepcid/Famotidine: May be prescribed while taking naproxen (Aleve) or other NSAIDs such as ibuprofen (Motrin/Advil) to prevent stomach upset or Acid-reflux symptoms. Take 1 tablet 1-2 times a day.   **Constipation: The first bowel movement may occur anywhere between 1-5 days after surgery.  As long as you are not nauseated or not having significant abdominal pain this variation is acceptable.  Narcotic pain medications can cause constipation increasing discomfort; early discontinuation will assist with bowel management.If constipated despite taking stool softeners, you may take Milk of Magnesia or Miralax as directed on the bottle.     **Home medications: You may restart your  home medications as directed by your respective Primary Care Physician or Surgeon.  When to notify your Doctor or Healthcare Team:   Sign of Wound Infection   Fever over 100 degrees.  Wound becomes extremely swollen, shows red streaks, warm to the touch, and/or drainage from the incision site or foul-smelling drainage.  Wound edges separate or opens up  Bleeding or bruising   If you have bleeding, apply pressure to the site and hold the pressure firmly for 5 minutes. If the bleeding continues, apply pressure again and call 911. If the bleeding stopped, call your doctor to report it.   Call your doctor or nurse if you have increased bleeding from your site and increased bruising or a lump forms or gets larger under your skin at the site.  Unrelieved Pain    Call your doctor or nurse if your pain gets worse or is not eased 1 hour after taking your pain medicine, or if it is severe and uncontrolled.  Nausea and Vomiting   Call your doctor or nurse if you have nausea and vomiting that continues more than 24  hours, will not let you keep medicine down and will not let you keep fluids down  Fever, Flu-like symptoms   Fever over 100 degrees and/or chills  Gastrointestinal Bleeding Symptoms    Black tarry bowel movements.  This can be normal after surgery on the stomach, but should resolve in a day or two.    Call 911 if you suddenly have signs of blood loss such as:  Vomiting blood  Fast heart rate  Feeling faint, sweaty, or blacking out  Passing bright red blood from your rectum  Blood Clot Symptoms   Tender, swollen or reddened areas in your calf muscle or thighs.  Numbness or tingling in your lower leg or calf, or at the top of your leg or groin  Skin on your leg looks pale or blue or feels cold to touch  Chest pain or have trouble breathing, lightheadedness, fast heart rate  Sudden Onset of Symptoms    Call 911 if you suddenly have:  Leg weakness and spasm  Loss of bladder or bowel  function  Seizure  Confusion, severe headache, dizziness or feeling unsteady, problems talking, difficulty swallowing, and/or numbness or muscle weakness as these could be signs of a stroke.   Follow up Appointment Your follow up appointment should be scheduled 2-3 weeks after your surgery date.  If you have not previously scheduled for a follow-up visit you can be scheduled by contacting [phone].

## 2022-12-31 ENCOUNTER — Encounter (HOSPITAL_COMMUNITY): Payer: Self-pay | Admitting: General Surgery

## 2023-01-07 ENCOUNTER — Ambulatory Visit: Payer: Self-pay | Admitting: Cardiology

## 2023-01-29 ENCOUNTER — Ambulatory Visit: Payer: Medicare HMO | Attending: Cardiology | Admitting: Cardiology

## 2023-01-29 ENCOUNTER — Encounter: Payer: Self-pay | Admitting: Cardiology

## 2023-01-29 VITALS — BP 116/78 | HR 90 | Ht 63.0 in | Wt 147.0 lb

## 2023-01-29 DIAGNOSIS — I4819 Other persistent atrial fibrillation: Secondary | ICD-10-CM | POA: Diagnosis not present

## 2023-01-29 DIAGNOSIS — R072 Precordial pain: Secondary | ICD-10-CM

## 2023-01-29 NOTE — Patient Instructions (Signed)
Medication Instructions:  Your physician recommends that you continue on your current medications as directed. Please refer to the Current Medication list given to you today.  *If you need a refill on your cardiac medications before your next appointment, please call your pharmacy*  Lab Work: None  Testing/Procedures: Your physician has requested that you have a lexiscan myoview. For further information please visit [email or web address] Please follow instruction sheet, as given. This will take place at [address], [address]  How to prepare for your Myocardial Perfusion Test: Do not eat or drink 3 hours prior to your test, except you may have water. Do not consume products containing caffeine (regular or decaffeinated) 12 hours prior to your test. (ex: coffee, chocolate, sodas, tea). Do bring a list of your current medications with you.  If not listed below, you may take your medications as normal. Do wear comfortable clothes (no dresses or overalls) and walking shoes, tennis shoes preferred (No heels or open toe shoes are allowed). Do NOT wear cologne, perfume, aftershave, or lotions (deodorant is allowed). The test will take approximately 3 to 4 hours to complete If these instructions are not followed, your test will have to be rescheduled.     Follow-Up: At [facility], you and your health needs are our priority.  As part of our continuing mission to provide you with exceptional heart care, we have created designated Provider Care Teams.  These Care Teams include your primary Cardiologist (physician) and Advanced Practice Providers (APPs -  Physician Assistants and Nurse Practitioners) who all work together to provide you with the care you need, when you need it.  Your next appointment:   6 month(s)  Provider:   Marjie Skiff, PA-C, or Joni Reining, DNP, ANP, or Reather Littler, NP

## 2023-01-29 NOTE — Progress Notes (Signed)
Cardiology Office Note:   Date:  01/29/2023  ID:  Berthel, Beu 1950-03-30, MRN [id number] PCP: Verdell Face, DO  [facility] Providers Cardiologist:  Rollene Rotunda, MD {  History of Present Illness:   [name] is a 72 y.o. female who presents for follow-up of atrial fibrillation. She has permanent atrial fibrillation.  Since I last saw her she had bilateral inguinal hernia repair as an outpatient last month.  She actually did okay with this.  She was added to my schedule today.  She missed an appointment because of that.  She has been having chest discomfort.  Her husband says it is almost like panic attacks but she gets chest discomfort under her mid chest.  It is moderate to significant in intensity.  Probably been going on for 3 months.  He thinks it happens about 2-3 times a week.  It is at rest.  It might last for 2 hours.  It is left upper chest and nonradiating.  She is not describing associated nausea vomiting or diaphoresis.  She is not describing new shortness of breath, PND or orthopnea.  She does a lot of activities around the house with household chores but does not bring on the symptoms with that.  She lost her daughter a few years ago to spindle cell carcinoma of the heart.    ROS: As stated in the HPI and negative for all other systems.  Studies Reviewed:    EKG:   EKG Interpretation Date/Time:  Friday January 29 2023 15:47:12 EST Ventricular Rate:  111 PR Interval:    QRS Duration:  128 QT Interval:  368 QTC Calculation: 500 R Axis:   -12  Text Interpretation: Atrial fibrillation Left bundle branch block When compared with ECG of 05-Oct-2022 15:19, rate is faster Confirmed by Rollene Rotunda ([id number]) on 01/29/2023 3:51:18 PM     Risk Assessment/Calculations:    CHA2DS2-VASc Score = 7   This indicates a 11.2% annual risk of stroke. The patient's score is based upon: CHF History: 0 HTN History: 1 Diabetes History: 1 Stroke  History: 2 Vascular Disease History: 1 Age Score: 1 Gender Score: 1   Physical Exam:   VS:  BP 116/78 (BP Location: Left Arm, Patient Position: Sitting, Cuff Size: Normal)   Pulse 90   Ht 5\' 3"  (1.6 m)   Wt 147 lb (66.7 kg)   LMP 06/03/1978   SpO2 96%   BMI 26.04 kg/m    Wt Readings from Last 3 Encounters:  01/29/23 147 lb (66.7 kg)  12/30/22 149 lb (67.6 kg)  12/17/22 149 lb (67.6 kg)     GEN: Well nourished, well developed in no acute distress NECK: No JVD; No carotid bruits CARDIAC: Irregular RR, no murmurs, rubs, gallops RESPIRATORY:  Clear to auscultation without rales, wheezing or rhonchi  ABDOMEN: Soft, non-tender, non-distended EXTREMITIES:  No edema; No deformity   ASSESSMENT AND PLAN:   ATRIAL FIB:   [name] has a CHA2DS2 - VASc score of 4.  She tolerates anticoagulation.  Her heart rate is a little bit elevated but her blood pressure has been soft so on somewhat hesitant to titrate her meds.  She is going to get a pulse oximeter so I can follow her heart rates and she will send me those readings and we will adjust as needed.  LBBB:   This has been chronic.  No change in therapy.  CAROTID STENOSIS:   She had mild stenosis earlier this  year.  No further imaging is indicated.  CHEST PAIN: This is nonanginal more than anginal sounding but she does have significant risk factors and the possibility of obstructive coronary disease is at least moderate.  She would be able to walk on a treadmill so I will screen her with a Lexiscan Myoview.   DM: A1c is up to 7.9.  This is followed by Verdell Face, DO.  I have asked her to schedule follow-up with him.  Current medicines are reviewed at length with the patient today.  The patient does not have concerns regarding medicines.       Follow up with me in one year.    Signed, Rollene Rotunda, MD

## 2023-02-01 ENCOUNTER — Telehealth (HOSPITAL_COMMUNITY): Payer: Self-pay | Admitting: *Deleted

## 2023-02-01 NOTE — Telephone Encounter (Signed)
Left message on voicemail per DPR in reference to upcoming appointment scheduled on  02/03/23 with detailed instructions given per Myocardial Perfusion Study Information Sheet for the test. LM to arrive 15 minutes early, and that it is imperative to arrive on time for appointment to keep from having the test rescheduled. If you need to cancel or reschedule your appointment, please call the office within 24 hours of your appointment. Failure to do so may result in a cancellation of your appointment, and a $50 no show fee. Phone number given for call back for any questions. Ricky Ala

## 2023-02-03 ENCOUNTER — Ambulatory Visit (HOSPITAL_COMMUNITY): Payer: Medicare HMO | Attending: Cardiology

## 2023-02-03 DIAGNOSIS — R072 Precordial pain: Secondary | ICD-10-CM

## 2023-02-03 LAB — MYOCARDIAL PERFUSION IMAGING
Estimated workload: 1
Exercise duration (min): 1 min
Exercise duration (sec): 0 s
LV dias vol: 66 mL (ref 46–106)
LV sys vol: 35 mL
Nuc Stress EF: 47 %
Peak HR: 148 {beats}/min
Rest HR: 126 {beats}/min
Rest Nuclear Isotope Dose: 10.2 mCi
SDS: 0
SRS: 0
SSS: 0
ST Depression (mm): 0 mm
Stress Nuclear Isotope Dose: 32.4 mCi
TID: 1.18

## 2023-02-03 MED ORDER — TECHNETIUM TC 99M TETROFOSMIN IV KIT
32.4000 | PACK | Freq: Once | INTRAVENOUS | Status: AC | PRN
  Administered 2023-02-03: 32.4 via INTRAVENOUS

## 2023-02-03 MED ORDER — REGADENOSON 0.4 MG/5ML IV SOLN
0.4000 mg | Freq: Once | INTRAVENOUS | Status: AC
  Administered 2023-02-03: 0.4 mg via INTRAVENOUS

## 2023-02-03 MED ORDER — TECHNETIUM TC 99M TETROFOSMIN IV KIT
10.2000 | PACK | Freq: Once | INTRAVENOUS | Status: AC | PRN
  Administered 2023-02-03: 10.2 via INTRAVENOUS

## 2023-02-12 ENCOUNTER — Ambulatory Visit: Payer: Medicare HMO | Attending: Cardiology | Admitting: *Deleted

## 2023-02-12 DIAGNOSIS — Z5181 Encounter for therapeutic drug level monitoring: Secondary | ICD-10-CM | POA: Diagnosis not present

## 2023-02-12 DIAGNOSIS — I4891 Unspecified atrial fibrillation: Secondary | ICD-10-CM | POA: Diagnosis not present

## 2023-02-12 LAB — POCT INR: INR: 1.8 — AB (ref 2.0–3.0)

## 2023-02-12 NOTE — Patient Instructions (Signed)
Description   Today take 1.5 tablets of warfarin then continue taking Warfarin 1.5 tablets daily except for 1 tablet on Mondays, Wednesdays and Fridays. Stay consistent with greens each week (1 per week).  Recheck INR 4 weeks.  Anticoagulation Clinic [phone]

## 2023-02-26 ENCOUNTER — Telehealth: Payer: Self-pay | Admitting: *Deleted

## 2023-02-26 DIAGNOSIS — R9439 Abnormal result of other cardiovascular function study: Secondary | ICD-10-CM

## 2023-02-26 NOTE — Telephone Encounter (Signed)
Spoke with pt, aware of results. Echo scheduled.

## 2023-02-26 NOTE — Telephone Encounter (Signed)
-----   Message from Rollene Rotunda sent at 02/06/2023  3:09 PM EST ----- No evidence of ischemia.  However, the EF was reported as slightly low and we need to follow this up with an echocardiogram.  Call Ms. Purves with the results and send results to Verdell Face, DO

## 2023-03-05 ENCOUNTER — Telehealth: Payer: Self-pay | Admitting: Cardiology

## 2023-03-05 DIAGNOSIS — I482 Chronic atrial fibrillation, unspecified: Secondary | ICD-10-CM

## 2023-03-05 MED ORDER — WARFARIN SODIUM 2.5 MG PO TABS: ORAL_TABLET | ORAL | 0 refills | Status: DC

## 2023-03-05 NOTE — Telephone Encounter (Signed)
Prescription refill request received for warfarin Lov: 01/29/23 (Hochrein) Next INR check: 01/10/24 Warfarin tablet strength: 2.5mg   Appropriate dose. Refill sent.

## 2023-03-05 NOTE — Telephone Encounter (Signed)
*  STAT* If patient is at the pharmacy, call can be transferred to refill team.   1. Which medications need to be refilled? (please list name of each medication and dose if known) warfarin (COUMADIN) 2.5 MG tablet    2. Would you like to learn more about the convenience, safety, & potential cost savings by using the [facility]? No   3. Are you open to using the [facility] (Type [facility] No   4. Which pharmacy/location (including street and city if local pharmacy) is medication to be sent to? [id number] - [place], [place] - [address]    5. Do they need a 30 day or 90 day supply? 90 day  Pt has only 2 tablets left

## 2023-03-12 ENCOUNTER — Ambulatory Visit: Payer: Medicare HMO | Attending: Cardiovascular Disease | Admitting: *Deleted

## 2023-03-12 DIAGNOSIS — I4891 Unspecified atrial fibrillation: Secondary | ICD-10-CM

## 2023-03-12 DIAGNOSIS — Z5181 Encounter for therapeutic drug level monitoring: Secondary | ICD-10-CM

## 2023-03-12 LAB — POCT INR: INR: 1.5 — AB (ref 2.0–3.0)

## 2023-03-12 NOTE — Patient Instructions (Addendum)
 Description   Today take 1.5 tablets of warfarin then START taking Warfarin 1.5 tablets daily except for 1 tablet on Mondays and Fridays. Stay consistent with greens each week (1 per week). Recheck INR 2 weeks.  Anticoagulation Clinic [phone]

## 2023-03-25 ENCOUNTER — Ambulatory Visit (HOSPITAL_COMMUNITY): Payer: Medicare HMO | Attending: Internal Medicine

## 2023-03-25 DIAGNOSIS — I081 Rheumatic disorders of both mitral and tricuspid valves: Secondary | ICD-10-CM | POA: Diagnosis not present

## 2023-03-25 DIAGNOSIS — R079 Chest pain, unspecified: Secondary | ICD-10-CM | POA: Insufficient documentation

## 2023-03-25 DIAGNOSIS — I447 Left bundle-branch block, unspecified: Secondary | ICD-10-CM | POA: Diagnosis not present

## 2023-03-25 DIAGNOSIS — I361 Nonrheumatic tricuspid (valve) insufficiency: Secondary | ICD-10-CM | POA: Diagnosis not present

## 2023-03-25 DIAGNOSIS — I4891 Unspecified atrial fibrillation: Secondary | ICD-10-CM | POA: Insufficient documentation

## 2023-03-25 DIAGNOSIS — Z8673 Personal history of transient ischemic attack (TIA), and cerebral infarction without residual deficits: Secondary | ICD-10-CM | POA: Diagnosis not present

## 2023-03-25 DIAGNOSIS — R9439 Abnormal result of other cardiovascular function study: Secondary | ICD-10-CM | POA: Insufficient documentation

## 2023-03-26 ENCOUNTER — Ambulatory Visit: Payer: Medicare HMO | Attending: Cardiovascular Disease

## 2023-03-26 DIAGNOSIS — Z5181 Encounter for therapeutic drug level monitoring: Secondary | ICD-10-CM | POA: Diagnosis not present

## 2023-03-26 DIAGNOSIS — I4891 Unspecified atrial fibrillation: Secondary | ICD-10-CM

## 2023-03-26 LAB — ECHOCARDIOGRAM COMPLETE
AR max vel: 1.6 cm2
AV Area VTI: 1.55 cm2
AV Area mean vel: 1.44 cm2
AV Mean grad: 4.4 mm[Hg]
AV Peak grad: 7.1 mm[Hg]
Ao pk vel: 1.33 m/s
Area-P 1/2: 3.55 cm2
S' Lateral: 3.3 cm

## 2023-03-26 LAB — POCT INR: INR: 2.2 (ref 2.0–3.0)

## 2023-03-26 NOTE — Patient Instructions (Signed)
Continue taking Warfarin 1.5 tablets daily except for 1 tablet on Mondays and Fridays. Stay consistent with greens each week (1 per week). Recheck INR 4 weeks.  Anticoagulation Clinic [phone]

## 2023-03-29 ENCOUNTER — Encounter: Payer: Self-pay | Admitting: *Deleted

## 2023-04-23 ENCOUNTER — Ambulatory Visit: Payer: Medicare HMO

## 2023-04-28 ENCOUNTER — Ambulatory Visit: Payer: Medicare HMO

## 2023-05-07 ENCOUNTER — Ambulatory Visit: Payer: Medicare HMO | Attending: Internal Medicine | Admitting: *Deleted

## 2023-05-07 DIAGNOSIS — Z5181 Encounter for therapeutic drug level monitoring: Secondary | ICD-10-CM | POA: Diagnosis not present

## 2023-05-07 DIAGNOSIS — I4891 Unspecified atrial fibrillation: Secondary | ICD-10-CM

## 2023-05-07 LAB — POCT INR: INR: 2.4 (ref 2.0–3.0)

## 2023-05-07 NOTE — Patient Instructions (Addendum)
 Description   Continue taking Warfarin 1.5 tablets daily except for 1 tablet on Mondays and Fridays. Stay consistent with greens each week (1 per week). Recheck INR 5 weeks.  Anticoagulation Clinic [phone]

## 2023-05-20 ENCOUNTER — Telehealth: Payer: Self-pay | Admitting: Cardiology

## 2023-05-20 DIAGNOSIS — I482 Chronic atrial fibrillation, unspecified: Secondary | ICD-10-CM

## 2023-05-20 MED ORDER — WARFARIN SODIUM 2.5 MG PO TABS: ORAL_TABLET | ORAL | 0 refills | Status: DC

## 2023-05-20 NOTE — Telephone Encounter (Signed)
 Prescription refill request received for warfarin Lov: 01/29/23 (Hochrein)  Next INR check: 06/11/23 Warfarin tablet strength: 2.5mg   Appropriate dose. Refill sent.

## 2023-05-20 NOTE — Telephone Encounter (Signed)
*  STAT* If patient is at the pharmacy, call can be transferred to refill team.   1. Which medications need to be refilled? (please list name of each medication and dose if known) warfarin (COUMADIN) 2.5 MG tablet    2. Would you like to learn more about the convenience, safety, & potential cost savings by using the [facility]?     3. Are you open to using the [facility] (Type [facility]. ).   4. Which pharmacy/location (including street and city if local pharmacy) is medication to be sent to? [id number] - [place], [place] - [address]    5. Do they need a 30 day or 90 day supply? 90

## 2023-05-26 ENCOUNTER — Telehealth: Payer: Self-pay | Admitting: Internal Medicine

## 2023-05-26 NOTE — Telephone Encounter (Signed)
 Patient is requesting to transfer her care back to Dr. Marina Goodell. Recently seen with [facility] for office visit and colonoscopy. Requesting to be seen for blood in stool. Patient states she has not been happy with care provided. Patient will have records sent

## 2023-06-08 ENCOUNTER — Emergency Department (HOSPITAL_COMMUNITY)

## 2023-06-08 ENCOUNTER — Other Ambulatory Visit: Payer: Self-pay

## 2023-06-08 ENCOUNTER — Emergency Department (HOSPITAL_COMMUNITY)
Admission: EM | Admit: 2023-06-08 | Discharge: 2023-06-09 | Disposition: A | Discharge status: Home / Self Care | Attending: Emergency Medicine | Admitting: Emergency Medicine

## 2023-06-08 ENCOUNTER — Encounter (HOSPITAL_COMMUNITY): Payer: Self-pay | Admitting: Emergency Medicine

## 2023-06-08 DIAGNOSIS — R519 Headache, unspecified: Secondary | ICD-10-CM | POA: Diagnosis present

## 2023-06-08 DIAGNOSIS — R110 Nausea: Secondary | ICD-10-CM | POA: Diagnosis not present

## 2023-06-08 DIAGNOSIS — Z7984 Long term (current) use of oral hypoglycemic drugs: Secondary | ICD-10-CM | POA: Diagnosis not present

## 2023-06-08 DIAGNOSIS — Z7901 Long term (current) use of anticoagulants: Secondary | ICD-10-CM | POA: Diagnosis not present

## 2023-06-08 DIAGNOSIS — Z79899 Other long term (current) drug therapy: Secondary | ICD-10-CM | POA: Insufficient documentation

## 2023-06-08 DIAGNOSIS — Z91040 Latex allergy status: Secondary | ICD-10-CM | POA: Insufficient documentation

## 2023-06-08 DIAGNOSIS — G4486 Cervicogenic headache: Secondary | ICD-10-CM | POA: Insufficient documentation

## 2023-06-08 DIAGNOSIS — M542 Cervicalgia: Secondary | ICD-10-CM

## 2023-06-08 LAB — CBC WITH DIFFERENTIAL/PLATELET
Abs Immature Granulocytes: 0.05 10*3/uL (ref 0.00–0.07)
Basophils Absolute: 0 10*3/uL (ref 0.0–0.1)
Basophils Relative: 0 %
Eosinophils Absolute: 0 10*3/uL (ref 0.0–0.5)
Eosinophils Relative: 0 %
HCT: 36.9 % (ref 36.0–46.0)
Hemoglobin: 12.1 g/dL (ref 12.0–15.0)
Immature Granulocytes: 0 %
Lymphocytes Relative: 13 %
Lymphs Abs: 1.5 10*3/uL (ref 0.7–4.0)
MCH: 30.2 pg (ref 26.0–34.0)
MCHC: 32.8 g/dL (ref 30.0–36.0)
MCV: 92 fL (ref 80.0–100.0)
Monocytes Absolute: 1.2 10*3/uL — ABNORMAL HIGH (ref 0.1–1.0)
Monocytes Relative: 10 %
Neutro Abs: 9.2 10*3/uL — ABNORMAL HIGH (ref 1.7–7.7)
Neutrophils Relative %: 77 %
Platelets: 231 10*3/uL (ref 150–400)
RBC: 4.01 MIL/uL (ref 3.87–5.11)
RDW: 13.7 % (ref 11.5–15.5)
WBC: 12 10*3/uL — ABNORMAL HIGH (ref 4.0–10.5)
nRBC: 0 % (ref 0.0–0.2)

## 2023-06-08 LAB — PROTIME-INR
INR: 2.1 — ABNORMAL HIGH (ref 0.8–1.2)
Prothrombin Time: 23.5 s — ABNORMAL HIGH (ref 11.4–15.2)

## 2023-06-08 MED ORDER — PROCHLORPERAZINE EDISYLATE 10 MG/2ML IJ SOLN
5.0000 mg | Freq: Once | INTRAMUSCULAR | Status: AC
  Administered 2023-06-08: 5 mg via INTRAVENOUS
  Filled 2023-06-08: qty 2

## 2023-06-08 MED ORDER — METHOCARBAMOL 1000 MG/10ML IJ SOLN
500.0000 mg | Freq: Once | INTRAMUSCULAR | Status: AC
  Administered 2023-06-09: 500 mg via INTRAVENOUS
  Filled 2023-06-08: qty 10

## 2023-06-08 NOTE — ED Provider Notes (Signed)
 [place] EMERGENCY DEPARTMENT AT [facility] Provider Note   CSN: [id number] Arrival date & time: 06/08/23  2135     History  Chief Complaint  Patient presents with   Headache   Neck Pain    [name] is a 73 y.o. female.  Patient to ED for evaluation of severe headache and neck pain that has been progressive throughout the day. Nausea without vomiting. No fever. She denies fall or injury. Per husband, she complains of headache and neck pain on a regular basis but symptoms worse tonight and seemed to start with bilateral neck pain and extends to bilateral head. History of atrial fibrillation, chronic Coumadin, dementia (mild per husband). No fever, chest pain, SOB.  The history is provided by the patient and the spouse. No language interpreter was used.  Headache Associated symptoms: neck pain   Neck Pain Associated symptoms: headaches        Home Medications Prior to Admission medications   Medication Sig Start Date End Date Taking? Authorizing Provider  ACCU-CHEK GUIDE test strip  11/06/20   [provider]  acetaminophen (TYLENOL) 500 MG tablet Take 500-1,000 mg by mouth every 6 (six) hours as needed for mild pain or headache.    [provider]  Alcohol Swabs (ALCOHOL WIPES) 70 % PADS Use 1 swab as needed to clean site for blood sugar check. 11/06/20   [provider]  atenolol (TENORMIN) 25 MG tablet Take 1.5 tablets (37.5 mg total) by mouth daily. 09/18/19   Theotis Barrio, MD  Blood Glucose Calibration [facility] CONTROL) SOLN  11/06/20   [provider]  Blood Glucose Monitoring Suppl (ACCU-CHEK GUIDE ME) w/Device KIT daily. 11/06/20   [provider]  Blood Glucose Monitoring Suppl (GLUCOCOM BLOOD GLUCOSE MONITOR) DEVI  11/06/20   [provider]  buPROPion (WELLBUTRIN XL) 150 MG 24 hr tablet Take 150 mg by mouth daily.    [provider]  Cholecalciferol (VITAMIN D3) 50 MCG (2000 UT)  TABS Take 2,000 Int'l Units by mouth daily.    [provider]  Cyanocobalamin (VITAMIN B-12) 5000 MCG TBDP Take 5,000 mcg by mouth daily.    [provider]  dicyclomine (BENTYL) 10 MG capsule Take 1 capsule (10 mg total) by mouth 3 (three) times daily as needed (diarrhea and urgency). 10/06/22   Prosperi, Christian H, PA-C  diltiazem (CARDIZEM CD) 180 MG 24 hr capsule Take 1 capsule (180 mg total) by mouth daily. 03/13/19   Levora Dredge, MD  DULoxetine (CYMBALTA) 60 MG capsule Take 1 capsule (60 mg total) by mouth daily. 03/13/19   Levora Dredge, MD  glucose blood (PRECISION QID TEST) test strip  11/06/20   [provider]  Lancet Devices (SIMPLE DIAGNOSTICS LANCING DEV) MISC  11/06/20   [provider]  levothyroxine (SYNTHROID) 50 MCG tablet Take 1 tablet (50 mcg total) by mouth every morning. 03/13/19   Levora Dredge, MD  losartan (COZAAR) 25 MG tablet Take 1 tablet (25 mg total) by mouth daily. 03/13/19   Helberg, Jill Alexanders, MD  metFORMIN (GLUCOPHAGE-XR) 500 MG 24 hr tablet TAKE 2 TABLETS(1000 MG) BY MOUTH TWICE DAILY WITH FOOD 12/12/19   Eliezer Bottom, MD  omeprazole (PRILOSEC) 40 MG capsule Take 40 mg by mouth daily.    [provider]  ondansetron (ZOFRAN-ODT) 4 MG disintegrating tablet Take 4 mg by mouth every 8 (eight) hours as needed for vomiting or nausea. 08/02/22   [provider]  rosuvastatin (CRESTOR) 20 MG  tablet Take 1 tablet (20 mg total) by mouth daily. 03/13/19   Levora Dredge, MD  traMADol (ULTRAM) 50 MG tablet Take 50 mg by mouth every 6 (six) hours as needed for moderate pain. 12/04/22   [provider]  warfarin (COUMADIN) 2.5 MG tablet TAKE 1 TO 1 AND 1/2 TABLETS BY MOUTH DAILY AS DIRECTED BY COUMADIN CLINIC 05/20/23   Rollene Rotunda, MD      Allergies    Gemfibrozil, Latex, Penicillins, Adhesive [tape], Codeine, Digoxin and related, Metformin and related, Omeprazole, and Ace inhibitors    Review of Systems   Review  of Systems  Musculoskeletal:  Positive for neck pain.  Neurological:  Positive for headaches.    Physical Exam Updated Vital Signs BP (!) 155/97 (BP Location: Left Arm)   Pulse 97   Temp 98.4 F (36.9 C) (Oral)   Resp 16   LMP 06/03/1978   SpO2 97%  Physical Exam Vitals and nursing note reviewed.  Constitutional:      Appearance: She is well-developed.  HENT:     Head: Normocephalic.  Eyes:     Extraocular Movements: Extraocular movements intact.     Pupils: Pupils are equal, round, and reactive to light.  Cardiovascular:     Rate and Rhythm: Normal rate and regular rhythm.     Heart sounds: No murmur heard. Pulmonary:     Effort: Pulmonary effort is normal.     Breath sounds: Normal breath sounds. No wheezing, rhonchi or rales.  Abdominal:     Palpations: Abdomen is soft.     Tenderness: There is no abdominal tenderness. There is no guarding or rebound.  Musculoskeletal:        General: Normal range of motion.     Cervical back: Normal range of motion and neck supple.  Skin:    General: Skin is warm and dry.  Neurological:     General: No focal deficit present.     Mental Status: She is alert and oriented to person, place, and time.     GCS: GCS eye subscore is 4. GCS verbal subscore is 5. GCS motor subscore is 6.     Cranial Nerves: No dysarthria or facial asymmetry.     Sensory: No sensory deficit.     Motor: No weakness or pronator drift.     Gait: Gait normal.     ED Results / Procedures / Treatments   Labs (all labs ordered are listed, but only abnormal results are displayed) Labs Reviewed  CBC WITH DIFFERENTIAL/PLATELET - Abnormal; Notable for the following components:      Result Value   WBC 12.0 (*)    Neutro Abs 9.2 (*)    Monocytes Absolute 1.2 (*)    All other components within normal limits  COMPREHENSIVE METABOLIC PANEL WITH GFR  PROTIME-INR    EKG None  Radiology No results found.  Procedures Procedures    Medications Ordered in  ED Medications  methocarbamol (ROBAXIN) injection 500 mg (has no administration in time range)  prochlorperazine (COMPAZINE) injection 5 mg (5 mg Intravenous Given 06/08/23 2328)    ED Course/ Medical Decision Making/ A&P Clinical Course as of 06/08/23 2355  Tue Jun 08, 2023  2317 Patient to ED with progressively worsening headache and neck pain throughout today. On coumadin. Patient has headache regularly but, per husband, this is worse than usual.  [SU]  2329 Compazine and Robaxin ordered. Feel pain is reproducible, bilateral, normal neuro exam, and c/w muscular headache. CT head and  neck pending as she is on Coumadin. Will check basic labs. Will need re-evaluation and review of tests ordered. Patient care signed out to Sherian Maroon, PA-C, pending review.  [SU]  2354 Patient has been discussed with Dr. Rhunette Croft who will see the patient.  [SU]    Clinical Course User Index [SU] [name], PA-C                                 Medical Decision Making Amount and/or Complexity of Data Reviewed Labs: ordered. Radiology: ordered.  Risk Prescription drug management.           Final Clinical Impression(s) / ED Diagnoses Final diagnoses:  Neck pain, bilateral  Cervicogenic headache    Rx / DC Orders ED Discharge Orders     None         [name], PA-C 06/08/23 2356    Derwood Kaplan, MD 06/09/23 1717

## 2023-06-08 NOTE — ED Provider Notes (Incomplete)
  Physical Exam  BP (!) 155/97 (BP Location: Left Arm)   Pulse 97   Temp 98.4 F (36.9 C) (Oral)   Resp 16   LMP 06/03/1978   SpO2 97%   Physical Exam  Procedures  Procedures  ED Course / MDM   Clinical Course as of 06/08/23 2348  Tue Jun 08, 2023  2317 Patient to ED with progressively worsening headache and neck pain throughout today. On coumadin. Patient has headache regularly but, per husband, this is worse than usual.  [SU]  2329 Compazine and Robaxin ordered. Feel pain is reproducible, bilateral, normal neuro exam, and c/w muscular headache. CT head and neck pending as she is on Coumadin. Will check basic labs. Will need re-evaluation and review of tests ordered. Patient care signed out to Sherian Maroon, PA-C, pending review.  [SU]    Clinical Course User Index [SU] [name], PA-C   Medical Decision Making Amount and/or Complexity of Data Reviewed Labs: ordered. Radiology: ordered.  Risk Prescription drug management.   ***

## 2023-06-08 NOTE — ED Triage Notes (Signed)
 Pt arriving via [facility] from home with neck pain and headache that has been present all day. Pt endorses nausea no vomiting. Denies hitting her head. Pt is on Warfarin. Recently diagnosed with dementia.

## 2023-06-08 NOTE — ED Provider Notes (Signed)
  Physical Exam  BP (!) 146/70   Pulse 90   Temp 98.8 F (37.1 C) (Oral)   Resp 18   LMP 06/03/1978   SpO2 95%   Physical Exam  Procedures  Procedures  ED Course / MDM   Clinical Course as of 06/09/23 0315  Tue Jun 08, 2023  2317 Patient to ED with progressively worsening headache and neck pain throughout today. On coumadin. Patient has headache regularly but, per husband, this is worse than usual.  [SU]  2329 Compazine and Robaxin ordered. Feel pain is reproducible, bilateral, normal neuro exam, and c/w muscular headache. CT head and neck pending as she is on Coumadin. Will check basic labs. Will need re-evaluation and review of tests ordered. Patient care signed out to Sherian Maroon, PA-C, pending review.  [SU]  2354 Patient has been discussed with Dr. Rhunette Croft who will see the patient.  [SU]  Wed Jun 09, 2023  0138 Reassessment of the patient showed still with neck pain/headache.  Will obtain CTA for assessment of potential aneurysm versus SAH versus other acute abnormality. [CR]    Clinical Course User Index [CR] Peter Garter, PA [SU] [name], PA-C   Medical Decision Making Amount and/or Complexity of Data Reviewed Labs: ordered. Radiology: ordered.  Risk OTC drugs. Prescription drug management.   Patient care handed off from PA-C [place].  See prior note for more details.  In short, 73 year old female presents emergency department with complaints of onset of "severe headache/neck pain."  Symptoms present throughout the day despite over-the-counter medications.  On exam, reviewed tenderness paraspinal region bilateral cervical region with extension down trapezial ridge.  Nonfocal neuroexam.  Awaiting CT imaging at time of shift change.  CT head/cervical spine negative for any acute abnormality.  Patient still with headache after 1 hour observation after imaging studies have resulted without significant improvement after medications.  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  Reevaluation the patient after second medications administered showed resolution of symptoms.  Patient was asleep in the bed and awoke describing symptom resolution.  Given patient's current warfarin use, will avoid NSAIDs.  Will recommend use of tylenol for pain and topical numbing agents.  Will recommend follow-up with PCP in the outpatient setting for reassessment.  Treatment plan discussed with patient and she acknowledged understanding was agreeable to said plan.  Patient overall well-appearing, afebrile in no acute distress.  Worrisome signs and symptoms were discussed.  Patient acknowledge understanding to return to emergency department if notice.  Patient stable upon discharge.       Peter Garter, [place] 06/09/23 1610    Derwood Kaplan, MD 06/09/23 [phone]

## 2023-06-09 ENCOUNTER — Emergency Department (HOSPITAL_COMMUNITY)

## 2023-06-09 ENCOUNTER — Encounter (HOSPITAL_COMMUNITY): Payer: Self-pay

## 2023-06-09 LAB — COMPREHENSIVE METABOLIC PANEL WITH GFR
ALT: 19 U/L (ref 0–44)
AST: 22 U/L (ref 15–41)
Albumin: 3.8 g/dL (ref 3.5–5.0)
Alkaline Phosphatase: 68 U/L (ref 38–126)
Anion gap: 10 (ref 5–15)
BUN: 10 mg/dL (ref 8–23)
CO2: 27 mmol/L (ref 22–32)
Calcium: 10 mg/dL (ref 8.9–10.3)
Chloride: 97 mmol/L — ABNORMAL LOW (ref 98–111)
Creatinine, Ser: 0.65 mg/dL (ref 0.44–1.00)
GFR, Estimated: >60 mL/min (ref >60)
Glucose, Bld: 172 mg/dL — ABNORMAL HIGH (ref 70–99)
Potassium: 3.6 mmol/L (ref 3.5–5.1)
Sodium: 134 mmol/L — ABNORMAL LOW (ref 135–145)
Total Bilirubin: 0.9 mg/dL (ref 0.0–1.2)
Total Protein: 7.7 g/dL (ref 6.5–8.1)

## 2023-06-09 MED ORDER — DIAZEPAM 2 MG PO TABS: 2.0000 mg | ORAL_TABLET | Freq: Three times a day (TID) | ORAL | 0 refills | Status: AC | PRN

## 2023-06-09 MED ORDER — LIDOCAINE 5 % EX PTCH: 1.0000 | MEDICATED_PATCH | CUTANEOUS | 0 refills | Status: AC

## 2023-06-09 MED ORDER — DIAZEPAM 2 MG PO TABS: 2.0000 mg | ORAL_TABLET | Freq: Three times a day (TID) | ORAL | 0 refills | Status: DC | PRN

## 2023-06-09 MED ORDER — LORAZEPAM 0.5 MG PO TABS
0.5000 mg | ORAL_TABLET | Freq: Once | ORAL | Status: AC
  Administered 2023-06-09: 0.5 mg via ORAL
  Filled 2023-06-09: qty 1

## 2023-06-09 MED ORDER — IOHEXOL 350 MG/ML SOLN
75.0000 mL | Freq: Once | INTRAVENOUS | Status: AC | PRN
  Administered 2023-06-09: 75 mL via INTRAVENOUS

## 2023-06-09 MED ORDER — ACETAMINOPHEN 500 MG PO TABS
1000.0000 mg | ORAL_TABLET | Freq: Once | ORAL | Status: AC
  Administered 2023-06-09: 1000 mg via ORAL
  Filled 2023-06-09: qty 2

## 2023-06-09 MED ORDER — LIDOCAINE 5 % EX PTCH
1.0000 | MEDICATED_PATCH | CUTANEOUS | Status: DC
  Administered 2023-06-09: 1 via TRANSDERMAL
  Filled 2023-06-09: qty 1

## 2023-06-09 NOTE — Discharge Instructions (Addendum)
 As discussed, the contrasted CT scan did show some narrowing of one of the vessels in your head but no obvious emergent aneurysm, brain bleed.  I suspect your symptoms are more muscular in nature.  Will send you home with medication to take as needed for your symptoms.  You may take Tylenol for baseline pain with this medication for any breakthrough pain.  Recommend follow-up with your primary care for reassessment of your symptoms. Please do not hesitate to return if the worrisome signs and symptoms we discussed become apparent.

## 2023-06-11 ENCOUNTER — Ambulatory Visit: Payer: Medicare HMO | Attending: Cardiology

## 2023-06-11 DIAGNOSIS — I4891 Unspecified atrial fibrillation: Secondary | ICD-10-CM

## 2023-06-11 DIAGNOSIS — Z5181 Encounter for therapeutic drug level monitoring: Secondary | ICD-10-CM | POA: Diagnosis not present

## 2023-06-11 LAB — POCT INR: INR: 1.6 — AB (ref 2.0–3.0)

## 2023-06-11 NOTE — Patient Instructions (Signed)
 Take 2 tablets today only then Continue taking Warfarin 1.5 tablets daily except for 1 tablet on Mondays and Fridays. Stay consistent with greens each week (1 per week). Recheck INR 3 weeks.  Anticoagulation Clinic [phone]

## 2023-06-30 ENCOUNTER — Ambulatory Visit: Attending: Cardiovascular Disease

## 2023-06-30 DIAGNOSIS — I4891 Unspecified atrial fibrillation: Secondary | ICD-10-CM | POA: Diagnosis not present

## 2023-06-30 LAB — POCT INR: INR: 2.9 (ref 2.0–3.0)

## 2023-06-30 NOTE — Patient Instructions (Addendum)
 Description   Continue taking Warfarin 1.5 tablets daily except for 1 tablet on Mondays and Fridays. Stay consistent with greens each week (1 per week). Recheck INR 4 weeks.  Anticoagulation Clinic [phone]

## 2023-07-13 NOTE — Telephone Encounter (Signed)
 PT was unable to sign record release with [facility]. It was on the phone and he could not see it. Send fax to request records.

## 2023-07-28 ENCOUNTER — Telehealth: Payer: Self-pay | Admitting: *Deleted

## 2023-07-28 ENCOUNTER — Ambulatory Visit

## 2023-07-28 NOTE — Telephone Encounter (Signed)
 Called pt since she missed her 2pm appt; there was no answer so left her a message to call back to reschedule.

## 2023-08-03 NOTE — Telephone Encounter (Signed)
 Good afternoon Dr. Elvin Hammer  The following patient is requesting to come back to our practice for bloody stools. She has been a patient of [facility] but very [facility]. I sent out a fax request to have records sent to us  on 5/6 and never received an update. Are you willing to take over the care of this patient. Please review and advise of scheduling. Thank you.

## 2023-08-03 NOTE — Telephone Encounter (Signed)
 She is welcome to see one of our advanced practitioners and rejoin the [facility] [facility] practice. I cannot offer her a spot with me, due to limited availability.

## 2023-08-05 NOTE — Progress Notes (Unsigned)
 Brigitte Canard, PA-C [address] [place], [place]  [address] Phone: [phone]   Gastroenterology Consultation  Referring Provider:     Myrl Askew, DO Primary Care Physician:  Myrl Askew, DO Primary Gastroenterologist:  Brigitte Canard, PA-C / Legrand Puma, MD  Reason for Consultation:     Rectal bleeding, vomiting        HPI:   [name] is a 73 y.o. y/o female referred for consultation & management  by Myrl Askew, DO.  Here to evaluate rectal bleeding and vomiting.  06/08/23 labs showed normal hemoglobin 12.1.  PMH: A-fib, TIA, carotid stenosis, HTN, type 2 diabetes, mild dementia, hypothyroidism, GERD, hemorrhoids, fatty liver, history of hiatal hernia repair.  Currently on Coumadin .  History of gastritis and prior cholecystectomy.  History colon polyps.  History of chronic diarrhea and IBS.  She was previously patient Dr. Elvin Hammer.  In the past year she has been seeing Dr. Rheba Cedar through [facility].  Unsatisfied with her care and is transferring back to [facility].  11/2022 last colonoscopy (to evaluate blood in stool) at [facility]: Showed no polyps.  Colon biopsies were negative for microscopic colitis.  There was small internal hemorrhoids and pan diverticulosis.  02/2021 EGD by Dr. Elvin Hammer: Normal.  S/p fundoplication.  02/2021 colonoscopy by Dr. Elvin Hammer: 2 small polyps removed -1 small tubular adenoma and 1 colon mucosal polyp.  Pandiverticulosis.  Excellent prep.  04/2017 colonoscopy by Dr. Elvin Hammer: 4 small polyps removed.  1 sessile serrated and 3 tubular adenoma polyps removed.  No dysplasia.  Excellent prep.  Past Medical History:  Diagnosis Date   Abnormal CT scan 03/16/2017   Anemia    Anxiety    Atrial fibrillation (HCC)    Bunion    Carotid stenosis    Carpal tunnel syndrome    mild    Cervical arthritis    Cholecystitis    s/p cholecystectomy   Chronic back pain    Chronic gastritis    COVID-19 2020   Depression     Diabetic peripheral neuropathy (HCC)    Diarrhea 04/05/2015   Digoxin  toxicity    08/2011   Diverticulitis    dx 04/08/14   Diverticulosis    Dysphagia    no documented strictures but responded positively to dilation in past.    Dysrhythmia    Atrial Fibrillation   Epileptic seizure, tonic (HCC)    .No meds since age of 62.   Family history of adverse reaction to anesthesia    "my mother may have"   Fatty liver    Fibromyalgia    GERD (gastroesophageal reflux disease)    H/O hiatal hernia    S/P hernia repair   Hyperlipidemia    Hypertension    Hypothyroid    Internal hemorrhoid    Joint pain 05/08/2011   Left knee pain 12/19/2014   Migraine    Mild cognitive impairment    Muscle spasm of back 08/10/2013   Nonspecific (abnormal) findings on radiological and other examination of gastrointestinal tract 08/07/2011   OSA (obstructive sleep apnea) dx'd 2003   "can't sleep w/that mask; lost some weight; maybe that's helped" (04/26/2014)   Osteoarthritis    Pneumonia X 2   Right foot pain 08/03/2013   Sarcoidosis    Seizures (HCC)    as a child, they stopped after age 84   Skin neoplasm    Stroke (HCC)    Hx TIA  TIA (transient ischemic attack)    "don't know when I had it" (04/26/2014)   Transaminase or LDH elevation    Type II diabetes mellitus (HCC)    Ventral hernia s/p laparoscopic lysis of adhesions and hernia repair with mesh 07/29/11 08/01/2011    Past Surgical History:  Procedure Laterality Date   ABDOMINAL HYSTERECTOMY  1980's   partial   BILATERAL OOPHORECTOMY  1980's   "after partial hysterectomy"   BREAST BIOPSY Right    benign   BREAST EXCISIONAL BIOPSY Right 03/2006   benign   BREAST SURGERY Right ~ 2010   excision milk duct;   BUNIONECTOMY Bilateral    CATARACT EXTRACTION W/PHACO  10/27/2010   Procedure: CATARACT EXTRACTION PHACO AND INTRAOCULAR LENS PLACEMENT (IOC);  Surgeon: Clay Cummins;  Location: [facility];  Service: Ophthalmology;   Laterality: Left;  CDE- 1.78   CATARACT EXTRACTION W/PHACO  07/13/2011   Procedure: CATARACT EXTRACTION PHACO AND INTRAOCULAR LENS PLACEMENT (IOC);  Surgeon: Clay Cummins, MD;  Location: [facility];  Service: Ophthalmology;  Laterality: Right;  CDE:  1.65   DILATION AND CURETTAGE OF UTERUS  1970; 1976   ESOPHAGEAL DILATION  multiple   EYE MUSCLE SURGERY Bilateral 2022   FOOT SURGERY Left ~ 2011   "straightened toe & scraped bone below big toe"   FOOT SURGERY Right ~ 2011   "scraped bone of big toe; shortened middle toet"   HIATAL HERNIA REPAIR  1990   "had to have scar tissue removed 6 months after repair"   INGUINAL HERNIA REPAIR Bilateral 12/30/2022   Procedure: BILATERAL OPEN INGUINAL HERNIA REPAIR WITH MESH;  Surgeon: Cannon Champion, MD;  Location: [facility];  Service: General;  Laterality: Bilateral;  CHOICE & TAP BLOCK   Inverted nipples  1992   LAPAROSCOPIC CHOLECYSTECTOMY  1980's   LIPOSUCTION  1992   SALPINGOOPHORECTOMY Bilateral    "6 months or so after hysterectomy"   SKIN CANCER EXCISION  1990's   "front side of right shin"   TUBAL LIGATION  1976   VENTRAL HERNIA REPAIR  07/29/2011   Procedure: LAPAROSCOPIC VENTRAL HERNIA;  Surgeon: Levert Ready, MD;  Location: [facility];  Service: General;  Laterality: N/A;  multiple incarcerated hernias with mesh    Prior to Admission medications   Medication Sig Start Date End Date Taking? Authorizing Provider  ACCU-CHEK GUIDE test strip  11/06/20   [provider]  acetaminophen  (TYLENOL ) 500 MG tablet Take 500-1,000 mg by mouth every 6 (six) hours as needed for mild pain or headache.    [provider]  Alcohol Swabs (ALCOHOL WIPES) 70 % PADS Use 1 swab as needed to clean site for blood sugar check. 11/06/20   [provider]  atenolol  (TENORMIN ) 25 MG tablet Take 1.5 tablets (37.5 mg total) by mouth daily. 09/18/19   Lee, Joshua K, MD  Blood Glucose Calibration [facility] CONTROL) SOLN  11/06/20    [provider]  Blood Glucose Monitoring Suppl (ACCU-CHEK GUIDE ME) w/Device KIT daily. 11/06/20   [provider]  Blood Glucose Monitoring Suppl (GLUCOCOM BLOOD GLUCOSE MONITOR) DEVI  11/06/20   [provider]  buPROPion (WELLBUTRIN XL) 150 MG 24 hr tablet Take 150 mg by mouth daily.    [provider]  Cholecalciferol  (VITAMIN D3) 50 MCG (2000 UT) TABS Take 2,000 Int'l Units by mouth daily.    [provider]  Cyanocobalamin  (VITAMIN B-12) 5000 MCG TBDP Take 5,000 mcg by mouth daily.    [provider]  diazepam  (VALIUM ) 2 MG tablet Take 1 tablet (2 mg total) by mouth every 8 (eight) hours as needed for muscle spasms. 06/09/23   [place] Butter, PA  dicyclomine  (BENTYL ) 10 MG capsule Take 1 capsule (10 mg total) by mouth 3 (three) times daily as needed (diarrhea and urgency). 10/06/22   Prosperi, Christian H, PA-C  diltiazem  (CARDIZEM  CD) 180 MG 24 hr capsule Take 1 capsule (180 mg total) by mouth daily. 03/13/19   Mickael Alamo, MD  DULoxetine  (CYMBALTA ) 60 MG capsule Take 1 capsule (60 mg total) by mouth daily. 03/13/19   Mickael Alamo, MD  glucose blood (PRECISION QID TEST) test strip  11/06/20   [provider]  Lancet Devices (SIMPLE DIAGNOSTICS LANCING DEV) MISC  11/06/20   [provider]  levothyroxine  (SYNTHROID ) 50 MCG tablet Take 1 tablet (50 mcg total) by mouth every morning. 03/13/19   Mickael Alamo, MD  lidocaine  (LIDODERM ) 5 % Place 1 patch onto the skin daily. Remove & Discard patch within 12 hours or as directed by MD 06/09/23   [place] Butter, PA  losartan  (COZAAR ) 25 MG tablet Take 1 tablet (25 mg total) by mouth daily. 03/13/19   Mickael Alamo, MD  metFORMIN  (GLUCOPHAGE -XR) 500 MG 24 hr tablet TAKE 2 TABLETS(1000 MG) BY MOUTH TWICE DAILY WITH FOOD 12/12/19   Aslam, Sadia, MD  omeprazole  (PRILOSEC) 40 MG capsule Take 40 mg by mouth daily.    [provider]  ondansetron  (ZOFRAN -ODT) 4 MG  disintegrating tablet Take 4 mg by mouth every 8 (eight) hours as needed for vomiting or nausea. 08/02/22   [provider]  rosuvastatin  (CRESTOR ) 20 MG tablet Take 1 tablet (20 mg total) by mouth daily. 03/13/19   Mickael Alamo, MD  traMADol  (ULTRAM ) 50 MG tablet Take 50 mg by mouth every 6 (six) hours as needed for moderate pain. 12/04/22   [provider]  warfarin (COUMADIN ) 2.5 MG tablet TAKE 1 TO 1 AND 1/2 TABLETS BY MOUTH DAILY AS DIRECTED BY COUMADIN  CLINIC 05/20/23   Eilleen Grates, MD    Family History  Problem Relation Age of Onset   Crohn's disease Mother    Colitis Mother        Crohns   Anesthesia problems Mother    Arthritis Mother        rheumatoid; maternal side of family also with RA   Pneumonia Mother 76       died with this   Diabetes Father    Heart disease Father    Melanoma Father    Coronary artery disease Father    Skin cancer Father    Stroke Father    Diabetes Sister    Migraines Sister    Depression Maternal Uncle    Dementia Maternal Uncle    Colon cancer Paternal Uncle        ? if stomach or colon    Other Daughter 56       Black cancer tumor in her heart, caused a heart attack   Hypotension Neg Hx    Malignant hyperthermia Neg Hx    Pseudochol deficiency Neg Hx    Esophageal cancer Neg Hx    Rectal cancer Neg Hx    Stomach cancer Neg Hx      Social History   Tobacco Use   Smoking status: Never   Smokeless tobacco: Never  Vaping Use   Vaping status: Never Used  Substance Use Topics   Alcohol use: Never  Alcohol/week: 0.0 standard drinks of alcohol   Drug use: Never    Allergies as of 08/06/2023 - Review Complete 06/09/2023  Allergen Reaction Noted   Gemfibrozil Swelling    Latex Other (See Comments)    Penicillins Hives 03/11/2010   Adhesive [tape] Other (See Comments) 05/26/2011   Codeine Hives    Digoxin  and related  10/05/2011   Metformin  and related Diarrhea 05/07/2014   Omeprazole  Diarrhea 05/07/2014    Ace inhibitors Rash 10/03/2012    Review of Systems:    All systems reviewed and negative except where noted in HPI.   Physical Exam:  LMP 06/03/1978  Patient's last menstrual period was 06/03/1978.  General:   Alert,  Well-developed, well-nourished, pleasant and cooperative in NAD Lungs:  Respirations even and unlabored.  Clear throughout to auscultation.   No wheezes, crackles, or rhonchi. No acute distress. Heart:  Regular rate and rhythm; no murmurs, clicks, rubs, or gallops. Abdomen:  Normal bowel sounds.  No bruits.  Soft, and non-distended without masses, hepatosplenomegaly or hernias noted.  No Tenderness.  No guarding or rebound tenderness.    Neurologic:  Alert and oriented x3;  grossly normal neurologically. Psych:  Alert and cooperative. Normal mood and affect.  Imaging Studies: No results found.  Labs: CBC    Component Value Date/Time   WBC 12.0 (H) 06/08/2023 2331   RBC 4.01 06/08/2023 2331   HGB 12.1 06/08/2023 2331   HGB 13.7 08/01/2019 1439   HCT 36.9 06/08/2023 2331   HCT 39.6 08/01/2019 1439   PLT 231 06/08/2023 2331   PLT 210 08/01/2019 1439   MCV 92.0 06/08/2023 2331   MCV 94 08/01/2019 1439   MCH 30.2 06/08/2023 2331   MCHC 32.8 06/08/2023 2331   RDW 13.7 06/08/2023 2331   RDW 12.3 08/01/2019 1439   LYMPHSABS 1.5 06/08/2023 2331   LYMPHSABS 2.8 04/03/2015 1531   MONOABS 1.2 (H) 06/08/2023 2331   EOSABS 0.0 06/08/2023 2331   EOSABS 0.1 04/03/2015 1531   BASOSABS 0.0 06/08/2023 2331   BASOSABS 0.0 04/03/2015 1531    CMP     Component Value Date/Time   NA 134 (L) 06/08/2023 2331   NA 142 07/28/2018 1501   K 3.6 06/08/2023 2331   CL 97 (L) 06/08/2023 2331   CO2 27 06/08/2023 2331   GLUCOSE 172 (H) 06/08/2023 2331   BUN 10 06/08/2023 2331   BUN 14 07/28/2018 1501   CREATININE 0.65 06/08/2023 2331   CREATININE 0.80 05/24/2014 1608   CALCIUM  10.0 06/08/2023 2331   PROT 7.7 06/08/2023 2331   PROT 6.6 04/03/2015 1531   ALBUMIN 3.8 06/08/2023  2331   ALBUMIN 4.2 04/03/2015 1531   AST 22 06/08/2023 2331   ALT 19 06/08/2023 2331   ALKPHOS 68 06/08/2023 2331   BILITOT 0.9 06/08/2023 2331   BILITOT 0.9 04/03/2015 1531   GFRNONAA >60 06/08/2023 2331   GFRNONAA 78 05/24/2014 1608   GFRAA >60 03/22/2019 1741   GFRAA 89 05/24/2014 1608    Assessment and Plan:   Aaliyah Gavel is a 73 y.o. y/o female has been referred for   1.  Rectal bleeding, most likely due to small internal hemorrhoids.  Most recent colonoscopy 11/2022 showed no polyps.  No evidence of IBD.  Biopsies negative for microscopic colitis.  Recent hemoglobin normal 12.1, no anemia. - Reassurance  2.  Chronic GERD s/p fundoplication.  3.  Internal hemorrhoids - Rx hydrocortisone  suppository 25 Mg  4.  History of adenomatous colon polyps -  No further colonoscopies are recommended due to advanced age and comorbidities.  5.  Irritable bowel syndrome, diarrhea predominant.  Chronic diarrhea for many years.  Prior cholecystectomy. - Try cholestyramine powder 1 packet in a drink once daily  Follow up ***  Brigitte Canard, PA-C

## 2023-08-06 ENCOUNTER — Encounter: Payer: Self-pay | Admitting: Physician Assistant

## 2023-08-06 ENCOUNTER — Other Ambulatory Visit

## 2023-08-06 ENCOUNTER — Ambulatory Visit: Admitting: Physician Assistant

## 2023-08-06 VITALS — BP 112/64 | HR 101 | Wt 150.0 lb

## 2023-08-06 DIAGNOSIS — K625 Hemorrhage of anus and rectum: Secondary | ICD-10-CM | POA: Diagnosis not present

## 2023-08-06 DIAGNOSIS — K648 Other hemorrhoids: Secondary | ICD-10-CM

## 2023-08-06 DIAGNOSIS — R197 Diarrhea, unspecified: Secondary | ICD-10-CM

## 2023-08-06 DIAGNOSIS — Z7901 Long term (current) use of anticoagulants: Secondary | ICD-10-CM

## 2023-08-06 DIAGNOSIS — K219 Gastro-esophageal reflux disease without esophagitis: Secondary | ICD-10-CM | POA: Diagnosis not present

## 2023-08-06 DIAGNOSIS — Z9889 Other specified postprocedural states: Secondary | ICD-10-CM

## 2023-08-06 DIAGNOSIS — Z860101 Personal history of adenomatous and serrated colon polyps: Secondary | ICD-10-CM

## 2023-08-06 DIAGNOSIS — K580 Irritable bowel syndrome with diarrhea: Secondary | ICD-10-CM

## 2023-08-06 LAB — COMPREHENSIVE METABOLIC PANEL WITH GFR
ALT: 20 U/L (ref 0–35)
AST: 25 U/L (ref 0–37)
Albumin: 4.1 g/dL (ref 3.5–5.2)
Alkaline Phosphatase: 74 U/L (ref 39–117)
BUN: 12 mg/dL (ref 6–23)
CO2: 29 meq/L (ref 19–32)
Calcium: 10 mg/dL (ref 8.4–10.5)
Chloride: 103 meq/L (ref 96–112)
Creatinine, Ser: 0.86 mg/dL (ref 0.40–1.20)
GFR: 66.25 mL/min (ref >60.00)
Glucose, Bld: 145 mg/dL — ABNORMAL HIGH (ref 70–99)
Potassium: 3.9 meq/L (ref 3.5–5.1)
Sodium: 141 meq/L (ref 135–145)
Total Bilirubin: 0.7 mg/dL (ref 0.2–1.2)
Total Protein: 7.3 g/dL (ref 6.0–8.3)

## 2023-08-06 LAB — CBC WITH DIFFERENTIAL/PLATELET
Basophils Absolute: 0 10*3/uL (ref 0.0–0.1)
Basophils Relative: 0.6 % (ref 0.0–3.0)
Eosinophils Absolute: 0.2 10*3/uL (ref 0.0–0.7)
Eosinophils Relative: 2.6 % (ref 0.0–5.0)
HCT: 36.2 % (ref 36.0–46.0)
Hemoglobin: 12 g/dL (ref 12.0–15.0)
Lymphocytes Relative: 29.1 % (ref 12.0–46.0)
Lymphs Abs: 1.9 10*3/uL (ref 0.7–4.0)
MCHC: 33.1 g/dL (ref 30.0–36.0)
MCV: 88.8 fl (ref 78.0–100.0)
Monocytes Absolute: 0.5 10*3/uL (ref 0.1–1.0)
Monocytes Relative: 7.2 % (ref 3.0–12.0)
Neutro Abs: 4 10*3/uL (ref 1.4–7.7)
Neutrophils Relative %: 60.5 % (ref 43.0–77.0)
Platelets: 301 10*3/uL (ref 150.0–400.0)
RBC: 4.08 Mil/uL (ref 3.87–5.11)
RDW: 15.1 % (ref 11.5–15.5)
WBC: 6.6 10*3/uL (ref 4.0–10.5)

## 2023-08-06 LAB — PROTIME-INR
INR: 1.6 ratio — ABNORMAL HIGH (ref 0.8–1.0)
Prothrombin Time: 16.8 s — ABNORMAL HIGH (ref 9.6–13.1)

## 2023-08-06 MED ORDER — CHOLESTYRAMINE 4 G PO PACK
4.0000 g | PACK | Freq: Every day | ORAL | 5 refills | Status: DC
Start: 2023-08-06 — End: 2023-08-30

## 2023-08-06 MED ORDER — HYDROCORTISONE ACETATE 25 MG RE SUPP
25.0000 mg | Freq: Every day | RECTAL | 1 refills | Status: AC
Start: 2023-08-06 — End: 2023-08-30

## 2023-08-06 NOTE — Patient Instructions (Addendum)
 Your provider has requested that you go to the basement level for lab work before leaving today. Press "B" on the elevator. The lab is located at the first door on the left as you exit the elevator.  Due to recent changes in healthcare laws, you may see the results of your imaging and laboratory studies on MyChart before your provider has had a chance to review them.  We understand that in some cases there may be results that are confusing or concerning to you. Not all laboratory results come back in the same time frame and the provider may be waiting for multiple results in order to interpret others.  Please give us  48 hours in order for your provider to thoroughly review all the results before contacting the office for clarification of your results.   We have sent the following medications to your pharmacy for you to pick up at your convenience: Hydrocortisone  Suppositories and Cholestyramine   Do not start Cholestyramine until after you have submitted stool studies.     Follow-up on :09/06/23 at 1:30 pm   _______________________________________________________  If your blood pressure at your visit was 140/90 or greater, please contact your primary care physician to follow up on this.  _______________________________________________________  If you are age 65 or older, your body mass index should be between 23-30. Your Body mass index is 26.57 kg/m. If this is out of the aforementioned range listed, please consider follow up with your Primary Care Provider.  If you are age 85 or younger, your body mass index should be between 19-25. Your Body mass index is 26.57 kg/m. If this is out of the aformentioned range listed, please consider follow up with your Primary Care Provider.   ________________________________________________________  The [facility] providers would like to encourage you to use MYCHART to communicate with providers for non-urgent requests or questions.  Due to long hold  times on the telephone, sending your provider a message by [facility] may be a faster and more efficient way to get a response.  Please allow 48 business hours for a response.  Please remember that this is for non-urgent requests.  _______________________________________________________  Thank you for choosing me and [facility].  Brigitte Canard, PA-C

## 2023-08-07 ENCOUNTER — Ambulatory Visit: Payer: Self-pay | Admitting: Physician Assistant

## 2023-08-07 NOTE — Progress Notes (Signed)
 Note, patient has some dementia.  Call and notify patient (and husband) labs show: 1. Normal CMP.  Glucose has improved.  Kidney test, liver tests, and electrolytes are normal. 2. Normal CBC.  White count and hemoglobin are normal.  No evidence of infection or anemia.  3. PT / INR are in therapeutic range on Coumadin . Continue with plan to complete the stool studies which were ordered (to eval diarrhea). Brigitte Canard, PA-C

## 2023-08-09 NOTE — Progress Notes (Signed)
 Noted

## 2023-08-11 ENCOUNTER — Ambulatory Visit

## 2023-08-12 ENCOUNTER — Other Ambulatory Visit: Payer: Self-pay | Admitting: Physician Assistant

## 2023-08-12 ENCOUNTER — Other Ambulatory Visit

## 2023-08-12 DIAGNOSIS — R197 Diarrhea, unspecified: Secondary | ICD-10-CM

## 2023-08-12 NOTE — Progress Notes (Signed)
 Lab notified me to put GI pathogen panel and C. difficile PCR stool test in future orders, so results can be released. I put new order in future orders. Brigitte Canard, PA-C

## 2023-08-13 LAB — GI PROFILE, STOOL, PCR

## 2023-08-13 NOTE — Progress Notes (Signed)
 Call and notify patient GI pathogen panel is negative for all infections.  This is great news!  She does not have an infection to cause diarrhea.  Please start prescription cholestyramine , mix 1 packet and drink once daily, #30, 5 refills to help control diarrhea. Brigitte Canard, PA-C

## 2023-08-18 ENCOUNTER — Ambulatory Visit: Attending: Cardiology

## 2023-08-18 DIAGNOSIS — I482 Chronic atrial fibrillation, unspecified: Secondary | ICD-10-CM | POA: Diagnosis not present

## 2023-08-18 DIAGNOSIS — I4891 Unspecified atrial fibrillation: Secondary | ICD-10-CM

## 2023-08-18 LAB — POCT INR: INR: 2.8 (ref 2.0–3.0)

## 2023-08-18 MED ORDER — WARFARIN SODIUM 2.5 MG PO TABS: ORAL_TABLET | ORAL | 0 refills | Status: DC

## 2023-08-18 NOTE — Patient Instructions (Signed)
 Description   Continue taking Warfarin 1.5 tablets daily except for 1 tablet on Mondays and Fridays. Stay consistent with greens each week (1 per week). Recheck INR 4 weeks.  Anticoagulation Clinic [phone]

## 2023-08-30 ENCOUNTER — Telehealth: Payer: Self-pay | Admitting: Physician Assistant

## 2023-08-30 DIAGNOSIS — R197 Diarrhea, unspecified: Secondary | ICD-10-CM

## 2023-08-30 MED ORDER — CHOLESTYRAMINE 4 G PO PACK: PACK | ORAL | 2 refills | Status: DC

## 2023-08-30 NOTE — Telephone Encounter (Signed)
 Patient's husband (patient in the background of call) calls stating that patient is having worsening diarrhea and rectal bleeding. She is now having 8-10 very loose stools daily; occurs worse anytime she eats or drinks. Has fecal urgency and has had several episodes of incontinence recently. Patient complains of brb covering toilet tissues each time she goes to the restroom. Has intermittent periumbilical abdominal pain that hurts awful; No excessive gas noted. No fever. Husband states that he often hears patient in the restroom crying and straining with bowel movements but she denies rectal pain and says this is more from frustration of her situation. Husband also mentions that patient's stomach seems extremely swollen when she lays down at night and can be flat again by morning. Patient states she has tried the hydrocortisone  suppositories and cholestyramine , both of which have been largely ineffective.  Husband also states that patient has been experiencing constant nausea, increasing reflux and excessive belching recently. She does take omeprazole  40 mg daily but this is no longer helping.  Please advise.

## 2023-08-30 NOTE — Telephone Encounter (Signed)
 Inbound call from patient's husband stating patient is still having urgent loose stools along with blood in still. States packets and suppositories have not been helping. States he is concerned. Requesting a call back to discuss further. Please advise, thank you.

## 2023-08-30 NOTE — Telephone Encounter (Signed)
 I have spoken to patient's husband to advise that we will order labs to be completed this week. Also advised that patient may increase cholestyramine  to 1 packet 2 to 3 times daily if needed for diarrhea. Discussed that if hydrocortisone  was ineffective for bleeding, we may need to discuss hemorrhoidal banding in the future. Additionally, husband is advised of ED precautions. In speaking further with both patient and her husband, patient says she has profound fatigue/weakness and just can't hardly get going. Husband says getting patient to go to the ER is like pulling eye teeth. I have spoken to patient to advise of the importance of ER evaluation if extreme fatigue/weakness along with diarrhea and rectal bleeding and patient verbalizes understanding.

## 2023-09-05 NOTE — Progress Notes (Deleted)
 Ellouise Console, PA-C [address] [place], [place]  [address] Phone: [phone]   Primary Care Physician: Zena Frederick, DO  Primary Gastroenterologist:  Ellouise Console, PA-C / ***  Chief Complaint: Follow-up IBS, diarrhea, GERD       HPI:   [name] is a 73 y.o. female  Current Outpatient Medications  Medication Sig Dispense Refill   ACCU-CHEK GUIDE test strip  (Patient not taking: Reported on 08/06/2023)     acetaminophen  (TYLENOL ) 500 MG tablet Take 500-1,000 mg by mouth every 6 (six) hours as needed for mild pain or headache.     Alcohol Swabs (ALCOHOL WIPES) 70 % PADS Use 1 swab as needed to clean site for blood sugar check. (Patient not taking: Reported on 08/06/2023)     atenolol  (TENORMIN ) 25 MG tablet Take 1.5 tablets (37.5 mg total) by mouth daily. 135 tablet 3   Blood Glucose Calibration (MYGLUCOHEALTH CONTROL) SOLN  (Patient not taking: Reported on 08/06/2023)     Blood Glucose Monitoring Suppl (ACCU-CHEK GUIDE ME) w/Device KIT daily. (Patient not taking: Reported on 08/06/2023)     Blood Glucose Monitoring Suppl (GLUCOCOM BLOOD GLUCOSE MONITOR) DEVI  (Patient not taking: Reported on 08/06/2023)     buPROPion (WELLBUTRIN XL) 150 MG 24 hr tablet Take 150 mg by mouth daily.     Cholecalciferol  (VITAMIN D3) 50 MCG (2000 UT) TABS Take 2,000 Int'l Units by mouth daily.     cholestyramine  (QUESTRAN ) 4 g packet Take 1 packet by mouth two to three times daily as needed for diarrhea 90 packet 2   Cyanocobalamin  (VITAMIN B-12) 5000 MCG TBDP Take 5,000 mcg by mouth daily.     diazepam  (VALIUM ) 2 MG tablet Take 1 tablet (2 mg total) by mouth every 8 (eight) hours as needed for muscle spasms. (Patient not taking: Reported on 08/06/2023) 8 tablet 0   dicyclomine  (BENTYL ) 10 MG capsule Take 1 capsule (10 mg total) by mouth 3 (three) times daily as needed (diarrhea and urgency). 90 capsule 2   diltiazem  (CARDIZEM  CD) 180 MG 24 hr capsule Take 1 capsule (180 mg total) by mouth  daily. 90 capsule 3   DULoxetine  (CYMBALTA ) 60 MG capsule Take 1 capsule (60 mg total) by mouth daily. 90 capsule 3   glucose blood (PRECISION QID TEST) test strip  (Patient not taking: Reported on 08/06/2023)     Lancet Devices (SIMPLE DIAGNOSTICS LANCING DEV) MISC  (Patient not taking: Reported on 08/06/2023)     levothyroxine  (SYNTHROID ) 50 MCG tablet Take 1 tablet (50 mcg total) by mouth every morning. 90 tablet 3   lidocaine  (LIDODERM ) 5 % Place 1 patch onto the skin daily. Remove & Discard patch within 12 hours or as directed by MD (Patient not taking: Reported on 08/06/2023) 30 patch 0   losartan  (COZAAR ) 25 MG tablet Take 1 tablet (25 mg total) by mouth daily. 90 tablet 3   metFORMIN  (GLUCOPHAGE -XR) 500 MG 24 hr tablet TAKE 2 TABLETS(1000 MG) BY MOUTH TWICE DAILY WITH FOOD 360 tablet 1   omeprazole  (PRILOSEC) 40 MG capsule Take 40 mg by mouth daily.     ondansetron  (ZOFRAN -ODT) 4 MG disintegrating tablet Take 4 mg by mouth every 8 (eight) hours as needed for vomiting or nausea. (Patient not taking: Reported on 08/06/2023)     rosuvastatin  (CRESTOR ) 20 MG tablet Take 1 tablet (20 mg total) by mouth daily. 90 tablet 3   traMADol  (ULTRAM ) 50 MG tablet Take 50 mg by mouth  every 6 (six) hours as needed for moderate pain.     warfarin (COUMADIN ) 2.5 MG tablet TAKE 1 TO 1 AND 1/2 TABLETS BY MOUTH DAILY AS DIRECTED BY COUMADIN  CLINIC 120 tablet 0   No current facility-administered medications for this visit.    Allergies as of 09/06/2023 - Review Complete 08/06/2023  Allergen Reaction Noted   Gemfibrozil Swelling    Latex Other (See Comments)    Penicillins Hives 03/11/2010   Adhesive [tape] Other (See Comments) 05/26/2011   Codeine Hives    Digoxin  and related  10/05/2011   Metformin  and related Diarrhea 05/07/2014   Omeprazole  Diarrhea 05/07/2014   Ace inhibitors Rash 10/03/2012    Past Medical History:  Diagnosis Date   Abnormal CT scan 03/16/2017   Anemia    Anxiety    Atrial  fibrillation (HCC)    Bunion    Carotid stenosis    Carpal tunnel syndrome    mild    Cervical arthritis    Cholecystitis    s/p cholecystectomy   Chronic back pain    Chronic gastritis    COVID-19 2020   Depression    Diabetic peripheral neuropathy (HCC)    Diarrhea 04/05/2015   Digoxin  toxicity    08/2011   Diverticulitis    dx 04/08/14   Diverticulosis    Dysphagia    no documented strictures but responded positively to dilation in past.    Dysrhythmia    Atrial Fibrillation   Epileptic seizure, tonic (HCC)    .No meds since age of 89.   Family history of adverse reaction to anesthesia    my mother may have   Fatty liver    Fibromyalgia    GERD (gastroesophageal reflux disease)    H/O hiatal hernia    S/P hernia repair   Hyperlipidemia    Hypertension    Hypothyroid    Internal hemorrhoid    Joint pain 05/08/2011   Left knee pain 12/19/2014   Migraine    Mild cognitive impairment    Muscle spasm of back 08/10/2013   Nonspecific (abnormal) findings on radiological and other examination of gastrointestinal tract 08/07/2011   OSA (obstructive sleep apnea) dx'd 2003   can't sleep w/that mask; lost some weight; maybe that's helped (04/26/2014)   Osteoarthritis    Pneumonia X 2   Right foot pain 08/03/2013   Sarcoidosis    Seizures (HCC)    as a child, they stopped after age 27   Skin neoplasm    Stroke (HCC)    Hx TIA   TIA (transient ischemic attack)    don't know when I had it (04/26/2014)   Transaminase or LDH elevation    Type II diabetes mellitus (HCC)    Ventral hernia s/p laparoscopic lysis of adhesions and hernia repair with mesh 07/29/11 08/01/2011    Past Surgical History:  Procedure Laterality Date   ABDOMINAL HYSTERECTOMY  1980's   partial   BILATERAL OOPHORECTOMY  1980's   after partial hysterectomy   BREAST BIOPSY Right    benign   BREAST EXCISIONAL BIOPSY Right 03/2006   benign   BREAST SURGERY Right ~ 2010   excision milk duct;    BUNIONECTOMY Bilateral    CATARACT EXTRACTION W/PHACO  10/27/2010   Procedure: CATARACT EXTRACTION PHACO AND INTRAOCULAR LENS PLACEMENT (IOC);  Surgeon: Dow JULIANNA Burke;  Location: [facility];  Service: Ophthalmology;  Laterality: Left;  CDE- 1.78   CATARACT EXTRACTION W/PHACO  07/13/2011   Procedure: CATARACT EXTRACTION  PHACO AND INTRAOCULAR LENS PLACEMENT (IOC);  Surgeon: Dow JULIANNA Burke, MD;  Location: [facility];  Service: Ophthalmology;  Laterality: Right;  CDE:  1.65   DILATION AND CURETTAGE OF UTERUS  1970; 1976   ESOPHAGEAL DILATION  multiple   EYE MUSCLE SURGERY Bilateral 2022   FOOT SURGERY Left ~ 2011   straightened toe & scraped bone below big toe   FOOT SURGERY Right ~ 2011   scraped bone of big toe; shortened middle toet   HIATAL HERNIA REPAIR  1990   had to have scar tissue removed 6 months after repair   INGUINAL HERNIA REPAIR Bilateral 12/30/2022   Procedure: BILATERAL OPEN INGUINAL HERNIA REPAIR WITH MESH;  Surgeon: Polly Cordella LABOR, MD;  Location: [facility];  Service: General;  Laterality: Bilateral;  CHOICE & TAP BLOCK   Inverted nipples  1992   LAPAROSCOPIC CHOLECYSTECTOMY  1980's   LIPOSUCTION  1992   SALPINGOOPHORECTOMY Bilateral    6 months or so after hysterectomy   SKIN CANCER EXCISION  1990's   front side of right shin   TUBAL LIGATION  1976   VENTRAL HERNIA REPAIR  07/29/2011   Procedure: LAPAROSCOPIC VENTRAL HERNIA;  Surgeon: Elon CHRISTELLA Pacini, MD;  Location: [facility];  Service: General;  Laterality: N/A;  multiple incarcerated hernias with mesh    Review of Systems:    All systems reviewed and negative except where noted in HPI.    Physical Exam:  LMP 06/03/1978  Patient's last menstrual period was 06/03/1978.  General: Well-nourished, well-developed in no acute distress.  Lungs: Clear to auscultation bilaterally. Non-labored. Heart: Regular rate and rhythm, no murmurs rubs or gallops.  Abdomen: Bowel sounds are normal; Abdomen is Soft; No  hepatosplenomegaly, masses or hernias;  No Abdominal Tenderness; No guarding or rebound tenderness. Neuro: Alert and oriented x 3.  Grossly intact.  Psych: Alert and cooperative, normal mood and affect.   Imaging Studies: No results found.  Labs: CBC    Component Value Date/Time   WBC 6.6 08/06/2023 1528   RBC 4.08 08/06/2023 1528   HGB 12.0 08/06/2023 1528   HGB 13.7 08/01/2019 1439   HCT 36.2 08/06/2023 1528   HCT 39.6 08/01/2019 1439   PLT 301.0 08/06/2023 1528   PLT 210 08/01/2019 1439   MCV 88.8 08/06/2023 1528   MCV 94 08/01/2019 1439   MCH 30.2 06/08/2023 2331   MCHC 33.1 08/06/2023 1528   RDW 15.1 08/06/2023 1528   RDW 12.3 08/01/2019 1439   LYMPHSABS 1.9 08/06/2023 1528   LYMPHSABS 2.8 04/03/2015 1531   MONOABS 0.5 08/06/2023 1528   EOSABS 0.2 08/06/2023 1528   EOSABS 0.1 04/03/2015 1531   BASOSABS 0.0 08/06/2023 1528   BASOSABS 0.0 04/03/2015 1531    CMP     Component Value Date/Time   NA 141 08/06/2023 1528   NA 142 07/28/2018 1501   K 3.9 08/06/2023 1528   CL 103 08/06/2023 1528   CO2 29 08/06/2023 1528   GLUCOSE 145 (H) 08/06/2023 1528   BUN 12 08/06/2023 1528   BUN 14 07/28/2018 1501   CREATININE 0.86 08/06/2023 1528   CREATININE 0.80 05/24/2014 1608   CALCIUM  10.0 08/06/2023 1528   PROT 7.3 08/06/2023 1528   PROT 6.6 04/03/2015 1531   ALBUMIN 4.1 08/06/2023 1528   ALBUMIN 4.2 04/03/2015 1531   AST 25 08/06/2023 1528   ALT 20 08/06/2023 1528   ALKPHOS 74 08/06/2023 1528   BILITOT 0.7 08/06/2023 1528   BILITOT 0.9 04/03/2015 1531  GFRNONAA >60 06/08/2023 2331   GFRNONAA 78 05/24/2014 1608   GFRAA >60 03/22/2019 1741   GFRAA 89 05/24/2014 1608       Assessment and Plan:   [name] is a 73 y.o. y/o female ***    Ellouise Console, PA-C  Follow up ***

## 2023-09-06 ENCOUNTER — Ambulatory Visit: Admitting: Physician Assistant

## 2023-09-15 ENCOUNTER — Ambulatory Visit

## 2023-09-24 NOTE — Telephone Encounter (Signed)
 Spoke to [place] who states that patient continues to have severe explosive diarrhea daily despite cholestyramine  1 packet 3 times daily. Also continues to state that hydrocortisone  suppositories have not helped her bleeding. Arvella apologizes that they did not come for 09/06/23 appointment as scheduled and states he did not realize she had an appointment and did not have it on his calendar. Patient has been rescheduled to see Ellouise Honora BARRE on first available date, 10/08/23 for repeat evaluation/treatment. Brad verbalizes understanding.

## 2023-09-24 NOTE — Telephone Encounter (Signed)
 Patient had an appointment with Ellouise Console, PA-C on 09/06/23 which patient did not come to. She was also advised she needed to go to the ER for profound fatigue/weakness/diarrhea but does not appear she went for evaluation there.  Attempted to reach patient's husband, Arvella. He answered the phone but then did not answer further when spoken to. Disconnected the call. Attempted to call back and got voicemail. Left message to call back.

## 2023-09-24 NOTE — Telephone Encounter (Signed)
 PT husband is returning call regarding same symptoms. They tried to medication regime and it did not help. The PT has gotten worse. Requesting a call back to discuss further

## 2023-09-24 NOTE — Telephone Encounter (Signed)
 Attempted to reach pt, no answer and unable to leave message. Will try again.

## 2023-09-28 NOTE — Telephone Encounter (Signed)
 Inbound call from [place], patient's husband, he states the powder medication for diarrhea is not working. He requested for patient to be seen sooner, and for something to be done he states she has urgency even when she drinks water. Would like to speak to [facility] in regards.

## 2023-09-28 NOTE — Telephone Encounter (Signed)
 Patient's husband, Arvella calls again stating that his wife continues to have explosive diarrhea with multiple watery bowel movements occurring daily; everything just goes right through her. He states that cholestyramine  3 times daily has been ineffective. Patient sits on the toilet for long periods and cries.   I have advised that unfortunately, I do not have any earlier appointment availabilities at this time but advised that if patient is having multiple diarrheal bowel movements not helped with medications and there is concern for dehydration, patient needs to be evaluated earlier in the emergency room.  Arvella states that patient has dementia, severe depression and has refused emergency room several times.   Patient has never tried imodium so he will get her to try this as well.

## 2023-09-29 MED ORDER — COLESTIPOL HCL 1 G PO TABS: 2.0000 g | ORAL_TABLET | Freq: Two times a day (BID) | ORAL | 5 refills | Status: DC

## 2023-09-29 NOTE — Telephone Encounter (Signed)
 Left message for [name] to call back.

## 2023-09-29 NOTE — Telephone Encounter (Signed)
 Colestid  rx sent to [facility].  ===View-only below this line=== ----- Message ----- From: [place], PA-C Sent: 09/29/2023   1:58 PM EDT To: Naomie LOISE Sharps, RN  Send Rx for Colestid  tablet 1 g take 2 tablets twice daily, #120, 5 refills.

## 2023-09-29 NOTE — Telephone Encounter (Signed)
 Spoke to [place] and advised of Tina's additional recommendations. He does request that we sent script for colestid  tablets in place of cholestyramine .

## 2023-09-29 NOTE — Addendum Note (Signed)
 Addended by: CLAUDENE NAOMIE SAILOR on: 09/29/2023 02:00 PM   Modules accepted: Orders

## 2023-10-04 ENCOUNTER — Ambulatory Visit

## 2023-10-07 NOTE — Progress Notes (Signed)
 [id number]     Kimberly Console, [name] [address] [place], [place]  [address] Phone: [phone]   Primary Care Physician: Zena Frederick, DO  Primary Gastroenterologist:  Kimberly Console, [name] / Norleen Kiang, MD   Chief Complaint: Explosive diarrhea       HPI:   [name] is a 73 y.o. female, established patient of Dr. Kiang, returns for 63-month follow-up of chronic diarrhea and rectal bleeding.  2 months ago she was given hydrocortisone  suppositories to treat hemorrhoid bleeding.  Had normal CBC and CMP labs (Hgb 12.0), negative GI pathogen panel, and negative C. difficile.  Continued on omeprazole  40 Mg daily.  Was started on cholestyramine  1 packet in a drink 3 times daily to treat bile salt diarrhea postcholecystectomy.  Patient has moderate dementia and is here with her husband who helps with her care.  Current symptoms: Patient tried taking cholestyramine  powder 1 packet 3 times daily which did not control diarrhea.  Still had explosive watery diarrhea.  She was switched to Colestid  2 tablets twice daily.  Also told to take OTC Imodium  and FiberCon tablets, however she did not start those.  Colestid  did not control her diarrhea.  She is taking metformin  500 Mg 2 tablets twice daily for diabetes for 5 or 6 years.  She continues to have explosive diarrhea multiple times per day.  Also continues to see moderate bright red blood on the tissue after BMs.  Patient refuses to go to the hospital for her symptoms even though her husband has tried to take her to the hospital.  She has a lot of anxiety.  A friend recommended trying IBgard (not yet started).  She has had chronic diarrhea for many years.  No recent antibiotics or new medications.  Previous colonoscopies have shown biopsies negative for microscopic colitis and IBD.  Fecal pancreatic elastase was normal 03/2022, no evidence of EPI.  She has had previous cholecystectomy and history of IBS.  She has had intermittent episodes of  rectal bleeding on and off for many years, attributed to hemorrhoids.  Previous colonoscopies have shown internal hemorrhoids.  Currently on Coumadin  for A-fib.    08/2023: Negative GI pathogen panel.   08/06/2023:  labs showed normal hemoglobin 12.0. 03/2022 normal fecal pancreatic elastase greater than 500. 03/2022 negative fecal lactoferrin.   PMH: A-fib, TIA, carotid stenosis, HTN, type 2 diabetes, mild dementia, hypothyroidism, GERD, hemorrhoids, fatty liver, history of hiatal hernia repair.  Currently on Coumadin .  History of gastritis and prior cholecystectomy.  History colon polyps.  History of chronic diarrhea and IBS.   11/2022 last colonoscopy (to evaluate blood in stool) at [facility]: Showed no polyps.  Colon biopsies were negative for microscopic colitis.  There was small internal hemorrhoids and pan diverticulosis.   02/2021 EGD by Dr. Kiang: Normal.  S/p fundoplication.   02/2021 colonoscopy by Dr. Kiang: 2 small polyps removed -1 small tubular adenoma and 1 colon mucosal polyp.  Pandiverticulosis.  Excellent prep.   04/2017 colonoscopy by Dr. Kiang: 4 small polyps removed.  1 sessile serrated and 3 tubular adenoma polyps removed.  No dysplasia.  Excellent prep.  Current Outpatient Medications  Medication Sig Dispense Refill   ACCU-CHEK GUIDE test strip      acetaminophen  (TYLENOL ) 500 MG tablet Take 500-1,000 mg by mouth every 6 (six) hours as needed for mild pain or headache.     Alcohol Swabs (ALCOHOL WIPES) 70 % PADS Use 1 swab as needed to  clean site for blood sugar check.     atenolol  (TENORMIN ) 25 MG tablet Take 1.5 tablets (37.5 mg total) by mouth daily. 135 tablet 3   Blood Glucose Calibration (MYGLUCOHEALTH CONTROL) SOLN      Blood Glucose Monitoring Suppl (ACCU-CHEK GUIDE ME) w/Device KIT daily.     Blood Glucose Monitoring Suppl (GLUCOCOM BLOOD GLUCOSE MONITOR) DEVI      buPROPion (WELLBUTRIN XL) 150 MG 24 hr tablet Take 150 mg by mouth daily.      Cholecalciferol  (VITAMIN D3) 50 MCG (2000 UT) TABS Take 2,000 Int'l Units by mouth daily.     colestipol  (COLESTID ) 1 g tablet Take 2 tablets (2 g total) by mouth 2 (two) times daily. 120 tablet 5   Cyanocobalamin  (VITAMIN B-12) 5000 MCG TBDP Take 5,000 mcg by mouth daily.     diazepam  (VALIUM ) 2 MG tablet Take 1 tablet (2 mg total) by mouth every 8 (eight) hours as needed for muscle spasms. 8 tablet 0   dicyclomine  (BENTYL ) 10 MG capsule Take 1 capsule (10 mg total) by mouth 3 (three) times daily as needed (diarrhea and urgency). 90 capsule 2   diltiazem  (CARDIZEM  CD) 180 MG 24 hr capsule Take 1 capsule (180 mg total) by mouth daily. 90 capsule 3   DULoxetine  (CYMBALTA ) 60 MG capsule Take 1 capsule (60 mg total) by mouth daily. 90 capsule 3   glucose blood (PRECISION QID TEST) test strip      Lancet Devices (SIMPLE DIAGNOSTICS LANCING DEV) MISC      levothyroxine  (SYNTHROID ) 50 MCG tablet Take 1 tablet (50 mcg total) by mouth every morning. 90 tablet 3   lidocaine  (LIDODERM ) 5 % Place 1 patch onto the skin daily. Remove & Discard patch within 12 hours or as directed by MD 30 patch 0   loperamide  (IMODIUM ) 2 MG capsule Take 2 capsules (4 mg total) by mouth 3 (three) times daily. 180 capsule 5   losartan  (COZAAR ) 25 MG tablet Take 1 tablet (25 mg total) by mouth daily. 90 tablet 3   metFORMIN  (GLUCOPHAGE -XR) 500 MG 24 hr tablet TAKE 2 TABLETS(1000 MG) BY MOUTH TWICE DAILY WITH FOOD (Patient taking differently: Take 500 mg by mouth 2 (two) times daily with a meal. TAKE 1 TABLETS(1000 MG) BY MOUTH TWICE DAILY WITH FOOD starting 10/08/2023) 360 tablet 1   omeprazole  (PRILOSEC) 40 MG capsule Take 40 mg by mouth daily.     ondansetron  (ZOFRAN -ODT) 4 MG disintegrating tablet Take 4 mg by mouth every 8 (eight) hours as needed for vomiting or nausea.     rosuvastatin  (CRESTOR ) 20 MG tablet Take 1 tablet (20 mg total) by mouth daily. 90 tablet 3   traMADol  (ULTRAM ) 50 MG tablet Take 50 mg by mouth every 6 (six)  hours as needed for moderate pain.     warfarin (COUMADIN ) 2.5 MG tablet TAKE 1 TO 1 AND 1/2 TABLETS BY MOUTH DAILY AS DIRECTED BY COUMADIN  CLINIC 120 tablet 0   No current facility-administered medications for this visit.    Allergies as of 10/08/2023 - Review Complete 10/08/2023  Allergen Reaction Noted   Gemfibrozil Swelling    Latex Other (See Comments)    Penicillins Hives 03/11/2010   Adhesive [tape] Other (See Comments) 05/26/2011   Codeine Hives    Digoxin  and related  10/05/2011   Metformin  and related Diarrhea 05/07/2014   Omeprazole  Diarrhea 05/07/2014   Ace inhibitors Rash 10/03/2012    Past Medical History:  Diagnosis Date   Abnormal CT scan  03/16/2017   Anemia    Anxiety    Atrial fibrillation (HCC)    Bunion    Carotid stenosis    Carpal tunnel syndrome    mild    Cervical arthritis    Cholecystitis    s/p cholecystectomy   Chronic back pain    Chronic gastritis    COVID-19 2020   Depression    Diabetic peripheral neuropathy (HCC)    Diarrhea 04/05/2015   Digoxin  toxicity    08/2011   Diverticulitis    dx 04/08/14   Diverticulosis    Dysphagia    no documented strictures but responded positively to dilation in past.    Dysrhythmia    Atrial Fibrillation   Epileptic seizure, tonic (HCC)    .No meds since age of 60.   Family history of adverse reaction to anesthesia    my mother may have   Fatty liver    Fibromyalgia    GERD (gastroesophageal reflux disease)    H/O hiatal hernia    S/P hernia repair   Hyperlipidemia    Hypertension    Hypothyroid    Internal hemorrhoid    Joint pain 05/08/2011   Left knee pain 12/19/2014   Migraine    Mild cognitive impairment    Muscle spasm of back 08/10/2013   Nonspecific (abnormal) findings on radiological and other examination of gastrointestinal tract 08/07/2011   OSA (obstructive sleep apnea) dx'd 2003   can't sleep w/that mask; lost some weight; maybe that's helped (04/26/2014)   Osteoarthritis     Pneumonia X 2   Right foot pain 08/03/2013   Sarcoidosis    Seizures (HCC)    as a child, they stopped after age 52   Skin neoplasm    Stroke (HCC)    Hx TIA   TIA (transient ischemic attack)    don't know when I had it (04/26/2014)   Transaminase or LDH elevation    Type II diabetes mellitus (HCC)    Ventral hernia s/p laparoscopic lysis of adhesions and hernia repair with mesh 07/29/11 08/01/2011    Past Surgical History:  Procedure Laterality Date   ABDOMINAL HYSTERECTOMY  1980's   partial   BILATERAL OOPHORECTOMY  1980's   after partial hysterectomy   BREAST BIOPSY Right    benign   BREAST EXCISIONAL BIOPSY Right 03/2006   benign   BREAST SURGERY Right ~ 2010   excision milk duct;   BUNIONECTOMY Bilateral    CATARACT EXTRACTION W/PHACO  10/27/2010   Procedure: CATARACT EXTRACTION PHACO AND INTRAOCULAR LENS PLACEMENT (IOC);  Surgeon: Dow JULIANNA Burke;  Location: [facility];  Service: Ophthalmology;  Laterality: Left;  CDE- 1.78   CATARACT EXTRACTION W/PHACO  07/13/2011   Procedure: CATARACT EXTRACTION PHACO AND INTRAOCULAR LENS PLACEMENT (IOC);  Surgeon: Dow JULIANNA Burke, MD;  Location: [facility];  Service: Ophthalmology;  Laterality: Right;  CDE:  1.65   DILATION AND CURETTAGE OF UTERUS  1970; 1976   ESOPHAGEAL DILATION  multiple   EYE MUSCLE SURGERY Bilateral 2022   FOOT SURGERY Left ~ 2011   straightened toe & scraped bone below big toe   FOOT SURGERY Right ~ 2011   scraped bone of big toe; shortened middle toet   HIATAL HERNIA REPAIR  1990   had to have scar tissue removed 6 months after repair   INGUINAL HERNIA REPAIR Bilateral 12/30/2022   Procedure: BILATERAL OPEN INGUINAL HERNIA REPAIR WITH MESH;  Surgeon: Polly Cordella LABOR, MD;  Location: [facility];  Service: General;  Laterality: Bilateral;  CHOICE & TAP BLOCK   Inverted nipples  1992   LAPAROSCOPIC CHOLECYSTECTOMY  1980's   LIPOSUCTION  1992   SALPINGOOPHORECTOMY Bilateral    6 months or so after  hysterectomy   SKIN CANCER EXCISION  1990's   front side of right shin   TUBAL LIGATION  1976   VENTRAL HERNIA REPAIR  07/29/2011   Procedure: LAPAROSCOPIC VENTRAL HERNIA;  Surgeon: Elon CHRISTELLA Pacini, MD;  Location: [facility];  Service: General;  Laterality: N/A;  multiple incarcerated hernias with mesh    Review of Systems:    All systems reviewed and negative except where noted in HPI.    Physical Exam:  BP 118/62   Pulse 76   Ht 5' 2 (1.575 m)   Wt 154 lb (69.9 kg)   LMP 06/03/1978   BMI 28.17 kg/m  Patient's last menstrual period was 06/03/1978.  General: Well-nourished, well-developed in no acute distress.  She walks with no assistive devices.  She is able to get on and off exam table with no help. Lungs: Clear to auscultation bilaterally. Non-labored. Heart: Regular rate and rhythm, no murmurs rubs or gallops.  Abdomen: Bowel sounds are normal; Abdomen is Soft; No hepatosplenomegaly, masses or hernias;  No Abdominal Tenderness; No guarding or rebound tenderness. Neuro: Alert and oriented x 3.  Grossly intact.  Psych: Alert and cooperative, anxious mood and affect.   Imaging Studies: No results found.  Labs: CBC    Component Value Date/Time   WBC 5.2 10/08/2023 1431   RBC 3.59 (L) 10/08/2023 1431   HGB 10.1 (L) 10/08/2023 1431   HGB 13.7 08/01/2019 1439   HCT 30.7 (L) 10/08/2023 1431   HCT 39.6 08/01/2019 1439   PLT 251.0 10/08/2023 1431   PLT 210 08/01/2019 1439   MCV 85.5 10/08/2023 1431   MCV 94 08/01/2019 1439   MCH 30.2 06/08/2023 2331   MCHC 32.8 10/08/2023 1431   RDW 15.2 10/08/2023 1431   RDW 12.3 08/01/2019 1439   LYMPHSABS 1.4 10/08/2023 1431   LYMPHSABS 2.8 04/03/2015 1531   MONOABS 0.4 10/08/2023 1431   EOSABS 0.1 10/08/2023 1431   EOSABS 0.1 04/03/2015 1531   BASOSABS 0.0 10/08/2023 1431   BASOSABS 0.0 04/03/2015 1531    CMP     Component Value Date/Time   NA 141 10/08/2023 1431   NA 142 07/28/2018 1501   K 3.3 (L) 10/08/2023 1431    CL 109 10/08/2023 1431   CO2 24 10/08/2023 1431   GLUCOSE 148 (H) 10/08/2023 1431   BUN 9 10/08/2023 1431   BUN 14 07/28/2018 1501   CREATININE 0.68 10/08/2023 1431   CREATININE 0.80 05/24/2014 1608   CALCIUM  9.4 10/08/2023 1431   PROT 6.7 10/08/2023 1431   PROT 6.6 04/03/2015 1531   ALBUMIN 4.0 10/08/2023 1431   ALBUMIN 4.2 04/03/2015 1531   AST 17 10/08/2023 1431   ALT 16 10/08/2023 1431   ALKPHOS 74 10/08/2023 1431   BILITOT 0.6 10/08/2023 1431   BILITOT 0.9 04/03/2015 1531   GFRNONAA >60 06/08/2023 2331   GFRNONAA 78 05/24/2014 1608   GFRAA >60 03/22/2019 1741   GFRAA 89 05/24/2014 1608       Assessment and Plan:   [name] is a 73 y.o. y/o female returns for follow-up of:  1.  RECTAL BLEEDING: Chronic and Intermittent for many years.  Attributed to internal hemorrhoids.  Most recent colonoscopy 11/2022 showed no polyps.  No evidence of IBD.  Biopsies negative for microscopic colitis.  Recent hemoglobin normal 12.0, no anemia. - Continue hydrocortisone  suppository 25 Mg nightly as needed.   -I will try to get diarrhea under control to help hemorrhoids. -Labs CBC, screen for anemia   2.  Internal hemorrhoids - Rx hydrocortisone  suppository 25 Mg once daily at bedtime, #12, 1 refill. -If persistent bleeding, consider internal hemorrhoid banding.   3.  DIARRHEA:  Chronic diarrhea for many years.  Hx Irritable bowel syndrome, diarrhea predominant.    Prior cholecystectomy.  Likely bile salt diarrhea postcholecystectomy.  I am also suspicious for diarrhea caused by high-dose metformin .  Recent GI pathogen panel negative for infectious pathogens.  CBC and CMP normal.  She has tried cholestyramine  powder, Colestid  tablets with little benefit. - Labs: CBC, CMP, CRP, TSH - Rx Imodium  2 mg, take 2 tablets 3 times per day, #180, 3 refills. - Start IB Guard, 2 capsules twice daily, samples given. - Decrease metformin  to 500 Mg 1 tab twice daily.  Encourage patient to  talk with her PCP about stopping metformin  or switching to a different medication. - If diarrhea persists, consider prescribing Lomotil .    Kimberly Console, [name]  Follow up office visit with TG in 2 months.

## 2023-10-08 ENCOUNTER — Ambulatory Visit: Attending: Cardiology

## 2023-10-08 ENCOUNTER — Ambulatory Visit (INDEPENDENT_AMBULATORY_CARE_PROVIDER_SITE_OTHER): Admitting: Physician Assistant

## 2023-10-08 ENCOUNTER — Other Ambulatory Visit

## 2023-10-08 VITALS — BP 118/62 | HR 76 | Ht 62.0 in | Wt 154.0 lb

## 2023-10-08 DIAGNOSIS — K625 Hemorrhage of anus and rectum: Secondary | ICD-10-CM

## 2023-10-08 DIAGNOSIS — I4891 Unspecified atrial fibrillation: Secondary | ICD-10-CM

## 2023-10-08 DIAGNOSIS — Z5181 Encounter for therapeutic drug level monitoring: Secondary | ICD-10-CM | POA: Diagnosis not present

## 2023-10-08 DIAGNOSIS — Z9049 Acquired absence of other specified parts of digestive tract: Secondary | ICD-10-CM | POA: Diagnosis not present

## 2023-10-08 DIAGNOSIS — E1122 Type 2 diabetes mellitus with diabetic chronic kidney disease: Secondary | ICD-10-CM

## 2023-10-08 DIAGNOSIS — K648 Other hemorrhoids: Secondary | ICD-10-CM

## 2023-10-08 DIAGNOSIS — E038 Other specified hypothyroidism: Secondary | ICD-10-CM | POA: Diagnosis not present

## 2023-10-08 DIAGNOSIS — K529 Noninfective gastroenteritis and colitis, unspecified: Secondary | ICD-10-CM

## 2023-10-08 LAB — CBC WITH DIFFERENTIAL/PLATELET
Basophils Absolute: 0 K/uL (ref 0.0–0.1)
Basophils Relative: 0.5 % (ref 0.0–3.0)
Eosinophils Absolute: 0.1 K/uL (ref 0.0–0.7)
Eosinophils Relative: 2.6 % (ref 0.0–5.0)
HCT: 30.7 % — ABNORMAL LOW (ref 36.0–46.0)
Hemoglobin: 10.1 g/dL — ABNORMAL LOW (ref 12.0–15.0)
Lymphocytes Relative: 27 % (ref 12.0–46.0)
Lymphs Abs: 1.4 K/uL (ref 0.7–4.0)
MCHC: 32.8 g/dL (ref 30.0–36.0)
MCV: 85.5 fl (ref 78.0–100.0)
Monocytes Absolute: 0.4 K/uL (ref 0.1–1.0)
Monocytes Relative: 7.3 % (ref 3.0–12.0)
Neutro Abs: 3.2 K/uL (ref 1.4–7.7)
Neutrophils Relative %: 62.6 % (ref 43.0–77.0)
Platelets: 251 K/uL (ref 150.0–400.0)
RBC: 3.59 Mil/uL — ABNORMAL LOW (ref 3.87–5.11)
RDW: 15.2 % (ref 11.5–15.5)
WBC: 5.2 K/uL (ref 4.0–10.5)

## 2023-10-08 LAB — COMPREHENSIVE METABOLIC PANEL WITH GFR
ALT: 16 U/L (ref 0–35)
AST: 17 U/L (ref 0–37)
Albumin: 4 g/dL (ref 3.5–5.2)
Alkaline Phosphatase: 74 U/L (ref 39–117)
BUN: 9 mg/dL (ref 6–23)
CO2: 24 meq/L (ref 19–32)
Calcium: 9.4 mg/dL (ref 8.4–10.5)
Chloride: 109 meq/L (ref 96–112)
Creatinine, Ser: 0.68 mg/dL (ref 0.40–1.20)
GFR: 85.31 mL/min (ref >60.00)
Glucose, Bld: 148 mg/dL — ABNORMAL HIGH (ref 70–99)
Potassium: 3.3 meq/L — ABNORMAL LOW (ref 3.5–5.1)
Sodium: 141 meq/L (ref 135–145)
Total Bilirubin: 0.6 mg/dL (ref 0.2–1.2)
Total Protein: 6.7 g/dL (ref 6.0–8.3)

## 2023-10-08 LAB — TSH: TSH: 0.53 u[IU]/mL (ref 0.35–5.50)

## 2023-10-08 LAB — HEMOGLOBIN A1C: Hgb A1c MFr Bld: 7.5 % — ABNORMAL HIGH (ref 4.6–6.5)

## 2023-10-08 LAB — C-REACTIVE PROTEIN: CRP: <1 mg/dL (ref 0.5–20.0)

## 2023-10-08 LAB — POCT INR: INR: 5.4 — AB (ref 2.0–3.0)

## 2023-10-08 MED ORDER — LOPERAMIDE HCL 2 MG PO CAPS: 4.0000 mg | ORAL_CAPSULE | Freq: Three times a day (TID) | ORAL | 5 refills | Status: DC

## 2023-10-08 NOTE — Progress Notes (Signed)
 INR 5.4 Please see anticoagulation encounter

## 2023-10-08 NOTE — Patient Instructions (Signed)
 Hold today, Saturday, Sunday and Monday then Continue taking Warfarin 1.5 tablets daily except for 1 tablet on Mondays and Fridays. Eat greens this weekend. Stay consistent with greens each week (1 per week).  Recheck INR 1 week.  Anticoagulation Clinic [phone]

## 2023-10-08 NOTE — Patient Instructions (Signed)
 Your provider has requested that you go to the basement level for lab work before leaving today. Press B on the elevator. The lab is located at the first door on the left as you exit the elevator.  We have sent the following medications to your pharmacy for you to pick up at your convenience:loperamide taking 2 capsule by mouth three times a day.  Please purchase the following medications over the counter and take as directed:IBgard 2 capsules by mouth twice daily.   Decrease metformin  to one tablet by mouth twice daily.  _______________________________________________________  If your blood pressure at your visit was 140/90 or greater, please contact your primary care physician to follow up on this.  _______________________________________________________  If you are age 18 or older, your body mass index should be between 23-30. Your Body mass index is 28.17 kg/m. If this is out of the aforementioned range listed, please consider follow up with your Primary Care Provider.  If you are age 75 or younger, your body mass index should be between 19-25. Your Body mass index is 28.17 kg/m. If this is out of the aformentioned range listed, please consider follow up with your Primary Care Provider.   ________________________________________________________  The [facility] providers would like to encourage you to use MYCHART to communicate with providers for non-urgent requests or questions.  Due to long hold times on the telephone, sending your provider a message by [facility] may be a faster and more efficient way to get a response.  Please allow 48 business hours for a response.  Please remember that this is for non-urgent requests.  _______________________________________________________  [facility] is using a team-based approach to care.  Your team is made up of your doctor and two to three APPS. Our APPS (Nurse Practitioners and Physician Assistants) work with your physician to  ensure care continuity for you. They are fully qualified to address your health concerns and develop a treatment plan. They communicate directly with your gastroenterologist to care for you. Seeing the Advanced Practice Practitioners on your physician's team can help you by facilitating care more promptly, often allowing for earlier appointments, access to diagnostic testing, procedures, and other specialty referrals.

## 2023-10-09 ENCOUNTER — Encounter: Payer: Self-pay | Admitting: Physician Assistant

## 2023-10-09 NOTE — Progress Notes (Signed)
 Noted

## 2023-10-11 ENCOUNTER — Ambulatory Visit: Payer: Self-pay | Admitting: Physician Assistant

## 2023-10-11 DIAGNOSIS — K625 Hemorrhage of anus and rectum: Secondary | ICD-10-CM

## 2023-10-11 DIAGNOSIS — K529 Noninfective gastroenteritis and colitis, unspecified: Secondary | ICD-10-CM

## 2023-10-11 NOTE — Progress Notes (Signed)
 Note, patient has dementia.  Call and notify patient and husband labs show: 1.  Hemoglobin A1c 7.5.  Goal is less than 6.  Glucose elevated 148.  Diabetes is not under good control.  Patient should schedule follow-up appointment with PCP for diabetes.  Patient should discuss adjusting diabetic medication with PCP.  I recently recommended decreasing metformin  due to diarrhea. 2.  Potassium is low.  Start Rx: Potassium chloride  20 mEq 1 tablet once daily, 30, no refills. 3.  Normal liver and kidney tests. 4.  Hemoglobin has dropped from 12.0-10.1 in the past 2 months.  Start Rx ferrous sulfate  325 mg 1 tablet once daily, #30, no refills. 5.  Normal TSH thyroid  test. 6.  Normal CRP.  No evidence of inflammation. **Schedule lab visit to check CBC, iron panel, ferritin, and BMP lab in 4 weeks. Ellouise Console, PA-C

## 2023-10-12 ENCOUNTER — Ambulatory Visit: Attending: Cardiology

## 2023-10-12 DIAGNOSIS — I4891 Unspecified atrial fibrillation: Secondary | ICD-10-CM

## 2023-10-12 DIAGNOSIS — Z5181 Encounter for therapeutic drug level monitoring: Secondary | ICD-10-CM

## 2023-10-12 LAB — POCT INR: INR: 1.5 — AB (ref 2.0–3.0)

## 2023-10-12 NOTE — Progress Notes (Signed)
 INR 1.5. Please see anticoagulation encounter

## 2023-10-12 NOTE — Patient Instructions (Signed)
 Take 2 tablets today only then Continue taking Warfarin 1.5 tablets daily except for 1 tablet on Mondays and Fridays.  Stay consistent with greens each week (1 per week).  Recheck INR 2 weeks.  Anticoagulation Clinic [phone]

## 2023-10-13 MED ORDER — FERROUS SULFATE 325 (65 FE) MG PO TABS: 325.0000 mg | ORAL_TABLET | Freq: Every day | ORAL | 0 refills | Status: DC

## 2023-10-13 MED ORDER — POTASSIUM CHLORIDE CRYS ER 20 MEQ PO TBCR: 20.0000 meq | EXTENDED_RELEASE_TABLET | Freq: Once | ORAL | 0 refills | Status: AC

## 2023-10-19 ENCOUNTER — Ambulatory Visit (INDEPENDENT_AMBULATORY_CARE_PROVIDER_SITE_OTHER): Admitting: Physical Medicine and Rehabilitation

## 2023-10-19 ENCOUNTER — Encounter: Payer: Self-pay | Admitting: Physical Medicine and Rehabilitation

## 2023-10-19 DIAGNOSIS — G8929 Other chronic pain: Secondary | ICD-10-CM

## 2023-10-19 DIAGNOSIS — M5416 Radiculopathy, lumbar region: Secondary | ICD-10-CM

## 2023-10-19 DIAGNOSIS — M5116 Intervertebral disc disorders with radiculopathy, lumbar region: Secondary | ICD-10-CM

## 2023-10-19 NOTE — Progress Notes (Signed)
 Core Outcome Measures Index (COMI) Back Score  Average Pain 9  COMI Score 90 %

## 2023-10-19 NOTE — Progress Notes (Signed)
 [place] - 73 y.o. female MRN [id number]  Date of birth: [birth year]  Office Visit Note: Visit Date: 10/19/2023 PCP: Zena Frederick, DO Referred by: Zena Frederick, DO  Subjective: Chief Complaint  Patient presents with   Lower Back - Pain   HPI: [name] is a 73 y.o. female who comes in today as a self referral for evaluation of chronic, worsening and severe bilateral lower back pain radiating down right leg. She is poor historian, husband at bedside today. She points to lateral aspect of right leg. Pain ongoing for several years. She is previous patient of Dr. Toribio Badder. Her pain worsens with prolonged sitting and standing to perform household tasks. She describes her pain as deep and sharp sensation, currently rates as 8 out of 10. Some relief of pain with home exercise regimen, rest and use of medications. History of formal physical therapy several years ago, no relief of pain with these treatments. Lumbar MRI imaging from 2024 shows broad central disc protrusion. bilateral subarticular recess stenosis which may impinge upon the L5 nerve roots. There is multi level moderate facet arthropathy. No severe spinal canal stenosis. She underwent radiofrequency ablation with Dr. Badder several years ago, states good left sided pain relief with this procedure, did not help right sided pain. Patient denies focal weakness, numbness and tingling. No recent trauma or falls..   Patients course is complicated by dementia, fibromyalgia, osteoarthritis, depression diabetes mellitus, atrial fibrillation and TIA. She is currently taking Coumadin .     Review of Systems  Musculoskeletal:  Positive for back pain and myalgias.  Neurological:  Negative for tingling, sensory change, focal weakness and weakness.  All other systems reviewed and are negative.  Otherwise per HPI.  Assessment & Plan: Visit Diagnoses:    ICD-10-CM   1. Chronic bilateral low back pain with right-sided  sciatica  G89.29 Ambulatory referral to Physical Medicine Rehab   M54.41     2. Lumbar radiculopathy  M54.16 Ambulatory referral to Physical Medicine Rehab    3. Intervertebral disc disorders with radiculopathy, lumbar region  M51.16 Ambulatory referral to Physical Medicine Rehab       Plan: Findings:  Chronic, worsening and severe bilateral lower back pain radiating down right leg. Patient continues to have severe pain despite good conservative therapies such as formal physical therapy, home exercise regimen, rest and use of medications. Patients clinical presentation and exam are consistent with lumbar radiculopathy, more of L5 nerve pattern. Lumbar MRI imaging from 2024 does show disc herniation at the level of L4-L5. There is bilateral lateral recess stenosis at this level. We discussed treatment plan in detail today. Next step is to perform diagnostic and hopefully therapeutic right L5 transforaminal epidural steroid injection under fluoroscopic guidance. She can continue with current medication regimen, does not need to discontinue Coumadin . I discussed injection procedure in detail, patient/husband has no further questions. No red flag symptoms noted upon exam today.     Meds & Orders: No orders of the defined types were placed in this encounter.   Orders Placed This Encounter  Procedures   Ambulatory referral to Physical Medicine Rehab    Follow-up: Return for Right L5 transforaminal epidural steroid injection.   Procedures: No procedures performed      Clinical History: EXAM: MRI LUMBAR SPINE WITHOUT CONTRAST  TECHNIQUE: Multiplanar, multisequence MR imaging of the lumbar spine was performed. No intravenous contrast was administered.  COMPARISON:  06/01/2013  FINDINGS: Segmentation:  Standard.  Alignment:  Minimal grade 1  anterolisthesis of L4 on L5.  Vertebrae: No acute fracture, evidence of discitis, or aggressive bone lesion. Schmorl's node along the superior and  inferior endplate of L3.  Conus medullaris and cauda equina: Conus extends to the L1-2 level. Conus and cauda equina appear normal.  Paraspinal and other soft tissues: No acute paraspinal abnormality. Small area of fat necrosis in the left posterior subcutaneous fat.  Disc levels:  Disc spaces: Mild disc height loss at L2-3 and L4-5.  T12-L1: No significant disc bulge. No neural foraminal stenosis. No central canal stenosis.  L1-L2: Mild broad-based disc bulge. Mild bilateral facet arthropathy. No foraminal or central canal stenosis.  L2-L3: Moderate disc bulge. Moderate bilateral facet arthropathy. Mild central canal stenosis. No foraminal stenosis.  L3-L4: Mild disc bulge. Mild bilateral facet arthropathy. No foraminal or central canal stenosis.  L4-L5: Mild disc bulge with a broad central disc protrusion. Bilateral subarticular recess stenosis which may impinge upon the L5 nerve roots. Mild central canal stenosis. Moderate bilateral facet arthropathy. Mild bilateral foraminal stenosis.  L5-S1: Mild disc bulge. Moderate bilateral facet arthropathy. No foraminal or central canal stenosis.  IMPRESSION: 1. At L4-5 there is a mild disc bulge with a broad central disc protrusion. Bilateral subarticular recess stenosis which may impinge upon the L5 nerve roots. Mild central canal stenosis. Moderate bilateral facet arthropathy. Mild bilateral foraminal stenosis. 2. At L2-3 there is a moderate disc bulge. Moderate bilateral facet arthropathy. Mild central canal stenosis. 3. No acute osseous injury of the lumbar spine.   Electronically Signed   By: Julaine Blanch M.D.   On: 02/14/2023 13:33 Exam End: 01/27/23 14:16   She reports that she has never smoked. She has never used smokeless tobacco.  Recent Labs    12/17/22 1434 10/08/23 1431  HGBA1C 7.5* 7.5*    Objective:  VS:  HT:    WT:   BMI:     BP:   HR: bpm  TEMP: ( )  RESP:  Physical Exam Vitals and nursing  note reviewed.  HENT:     Head: Normocephalic and atraumatic.     Right Ear: External ear normal.     Left Ear: External ear normal.     Nose: Nose normal.     Mouth/Throat:     Mouth: Mucous membranes are moist.  Eyes:     Extraocular Movements: Extraocular movements intact.  Cardiovascular:     Rate and Rhythm: Normal rate.     Pulses: Normal pulses.  Pulmonary:     Effort: Pulmonary effort is normal.  Abdominal:     General: Abdomen is flat. There is no distension.  Musculoskeletal:        General: Tenderness present.     Cervical back: Normal range of motion.     Comments: Patient is slow to rise from seated position to standing. Good lumbar range of motion. No pain noted with facet loading. 5/5 strength noted with bilateral hip flexion, knee flexion/extension, ankle dorsiflexion/plantarflexion and EHL. No clonus noted bilaterally. No pain upon palpation of greater trochanters. No pain with internal/external rotation of bilateral hips. Sensation intact bilaterally. Dysesthesias noted to right L5 dermatome. Negative slump test bilaterally. Ambulates without aid, gait steady.     Skin:    General: Skin is warm and dry.     Capillary Refill: Capillary refill takes less than 2 seconds.  Neurological:     General: No focal deficit present.     Mental Status: She is alert and oriented to person, place,  and time.  Psychiatric:        Mood and Affect: Mood normal.        Behavior: Behavior normal.     Ortho Exam  Imaging: No results found.  Past Medical/Family/Surgical/Social History: Medications & Allergies reviewed per EMR, new medications updated. Patient Active Problem List   Diagnosis Date Noted   Educated about COVID-19 virus infection 07/27/2019   Pelvic pain 07/13/2019   Left inguinal pain 07/13/2019   Adjustment disorder with depressed mood 03/23/2019   Subcutaneous fat necrosis 01/18/2019   Vitamin D  deficiency 04/28/2014   Poor dentition 04/03/2013   Healthcare  maintenance 03/31/2013   Right flank pain 01/14/2013   Abnormality of gait 07/25/2012   Headache 02/25/2012   Chronic back pain 10/07/2011   Financial difficulties 10/07/2011   Hemorrhoids 10/06/2011   Anemia 10/02/2011   Diverticulosis 10/02/2011   Constipation 10/02/2011   Depression 05/09/2009   OBESITY, UNSPECIFIED 11/06/2008   Carotid stenosis 11/06/2008   FIBROMYALGIA, SEVERE 09/14/2008   GERD 09/12/2008   Osteopenia 06/22/2008   Hyperlipidemia 06/30/2007   TIA 06/01/2007   DIABETIC PERIPHERAL NEUROPATHY 04/05/2006   Diabetes type 2, controlled (HCC) 03/18/2006   Hypothyroidism 12/21/2005   Essential hypertension 12/21/2005   Atrial fibrillation (HCC) 12/21/2005   Osteoarthritis 12/21/2005   Past Medical History:  Diagnosis Date   Abnormal CT scan 03/16/2017   Anemia    Anxiety    Atrial fibrillation (HCC)    Bunion    Carotid stenosis    Carpal tunnel syndrome    mild    Cervical arthritis    Cholecystitis    s/p cholecystectomy   Chronic back pain    Chronic gastritis    COVID-19 2020   Depression    Diabetic peripheral neuropathy (HCC)    Diarrhea 04/05/2015   Digoxin  toxicity    08/2011   Diverticulitis    dx 04/08/14   Diverticulosis    Dysphagia    no documented strictures but responded positively to dilation in past.    Dysrhythmia    Atrial Fibrillation   Epileptic seizure, tonic (HCC)    .No meds since age of 62.   Family history of adverse reaction to anesthesia    my mother may have   Fatty liver    Fibromyalgia    GERD (gastroesophageal reflux disease)    H/O hiatal hernia    S/P hernia repair   Hyperlipidemia    Hypertension    Hypothyroid    Internal hemorrhoid    Joint pain 05/08/2011   Left knee pain 12/19/2014   Migraine    Mild cognitive impairment    Muscle spasm of back 08/10/2013   Nonspecific (abnormal) findings on radiological and other examination of gastrointestinal tract 08/07/2011   OSA (obstructive sleep apnea)  dx'd 2003   can't sleep w/that mask; lost some weight; maybe that's helped (04/26/2014)   Osteoarthritis    Pneumonia X 2   Right foot pain 08/03/2013   Sarcoidosis    Seizures (HCC)    as a child, they stopped after age 63   Skin neoplasm    Stroke (HCC)    Hx TIA   TIA (transient ischemic attack)    don't know when I had it (04/26/2014)   Transaminase or LDH elevation    Type II diabetes mellitus (HCC)    Ventral hernia s/p laparoscopic lysis of adhesions and hernia repair with mesh 07/29/11 08/01/2011   Family History  Problem Relation Age of Onset  Crohn's disease Mother    Colitis Mother        Crohns   Anesthesia problems Mother    Arthritis Mother        rheumatoid; maternal side of family also with RA   Pneumonia Mother 72       died with this   Diabetes Father    Heart disease Father    Melanoma Father    Coronary artery disease Father    Skin cancer Father    Stroke Father    Diabetes Sister    Migraines Sister    Depression Maternal Uncle    Dementia Maternal Uncle    Colon cancer Paternal Uncle        ? if stomach or colon    Other Daughter 84       Black cancer tumor in her heart, caused a heart attack   Hypotension Neg Hx    Malignant hyperthermia Neg Hx    Pseudochol deficiency Neg Hx    Esophageal cancer Neg Hx    Rectal cancer Neg Hx    Stomach cancer Neg Hx    Past Surgical History:  Procedure Laterality Date   ABDOMINAL HYSTERECTOMY  1980's   partial   BILATERAL OOPHORECTOMY  1980's   after partial hysterectomy   BREAST BIOPSY Right    benign   BREAST EXCISIONAL BIOPSY Right 03/2006   benign   BREAST SURGERY Right ~ 2010   excision milk duct;   BUNIONECTOMY Bilateral    CATARACT EXTRACTION W/PHACO  10/27/2010   Procedure: CATARACT EXTRACTION PHACO AND INTRAOCULAR LENS PLACEMENT (IOC);  Surgeon: Dow JULIANNA Burke;  Location: [facility];  Service: Ophthalmology;  Laterality: Left;  CDE- 1.78   CATARACT EXTRACTION W/PHACO  07/13/2011    Procedure: CATARACT EXTRACTION PHACO AND INTRAOCULAR LENS PLACEMENT (IOC);  Surgeon: Dow JULIANNA Burke, MD;  Location: [facility];  Service: Ophthalmology;  Laterality: Right;  CDE:  1.65   DILATION AND CURETTAGE OF UTERUS  1970; 1976   ESOPHAGEAL DILATION  multiple   EYE MUSCLE SURGERY Bilateral 2022   FOOT SURGERY Left ~ 2011   straightened toe & scraped bone below big toe   FOOT SURGERY Right ~ 2011   scraped bone of big toe; shortened middle toet   HIATAL HERNIA REPAIR  1990   had to have scar tissue removed 6 months after repair   INGUINAL HERNIA REPAIR Bilateral 12/30/2022   Procedure: BILATERAL OPEN INGUINAL HERNIA REPAIR WITH MESH;  Surgeon: Polly Cordella LABOR, MD;  Location: [facility];  Service: General;  Laterality: Bilateral;  CHOICE & TAP BLOCK   Inverted nipples  1992   LAPAROSCOPIC CHOLECYSTECTOMY  1980's   LIPOSUCTION  1992   SALPINGOOPHORECTOMY Bilateral    6 months or so after hysterectomy   SKIN CANCER EXCISION  1990's   front side of right shin   TUBAL LIGATION  1976   VENTRAL HERNIA REPAIR  07/29/2011   Procedure: LAPAROSCOPIC VENTRAL HERNIA;  Surgeon: Elon CHRISTELLA Pacini, MD;  Location: [facility];  Service: General;  Laterality: N/A;  multiple incarcerated hernias with mesh   Social History   Occupational History   Occupation: Retired    Associate Professor: DISABLED  Tobacco Use   Smoking status: Never   Smokeless tobacco: Never  Vaping Use   Vaping status: Never Used  Substance and Sexual Activity   Alcohol use: Never    Alcohol/week: 0.0 standard drinks of alcohol   Drug use: Never   Sexual activity: Yes  Birth control/protection: Surgical

## 2023-10-19 NOTE — Progress Notes (Signed)
 Pain Scale   Average Pain 10 Patient advising she has chronic lower back pain radiating to both hips        +Driver, -BT, -Dye Allergies.

## 2023-10-26 ENCOUNTER — Ambulatory Visit: Attending: Cardiology

## 2023-10-26 DIAGNOSIS — Z5181 Encounter for therapeutic drug level monitoring: Secondary | ICD-10-CM | POA: Diagnosis not present

## 2023-10-26 DIAGNOSIS — I4891 Unspecified atrial fibrillation: Secondary | ICD-10-CM

## 2023-10-26 LAB — POCT INR: INR: 1.8 — AB (ref 2.0–3.0)

## 2023-10-26 NOTE — Progress Notes (Signed)
 INR 1.8  Please see anticoagulation encounter   Take 2 tablets today only then Increase 1.5 tablets daily except for 1 tablet on Fridays.  Stay consistent with greens each week (1 per week).  Recheck INR 3 weeks.  Anticoagulation Clinic [phone]

## 2023-10-26 NOTE — Patient Instructions (Signed)
 Take 2 tablets today only then Increase 1.5 tablets daily except for 1 tablet on Fridays.  Stay consistent with greens each week (1 per week).  Recheck INR 3 weeks.  Anticoagulation Clinic [phone]

## 2023-11-02 ENCOUNTER — Other Ambulatory Visit: Payer: Self-pay

## 2023-11-02 ENCOUNTER — Emergency Department (HOSPITAL_COMMUNITY)
Admission: EM | Admit: 2023-11-02 | Discharge: 2023-11-03 | Disposition: A | Discharge status: Home / Self Care | Attending: Emergency Medicine | Admitting: Emergency Medicine

## 2023-11-02 ENCOUNTER — Encounter (HOSPITAL_COMMUNITY): Payer: Self-pay | Admitting: Emergency Medicine

## 2023-11-02 DIAGNOSIS — Z7984 Long term (current) use of oral hypoglycemic drugs: Secondary | ICD-10-CM | POA: Insufficient documentation

## 2023-11-02 DIAGNOSIS — Z91040 Latex allergy status: Secondary | ICD-10-CM | POA: Diagnosis not present

## 2023-11-02 DIAGNOSIS — R110 Nausea: Secondary | ICD-10-CM

## 2023-11-02 DIAGNOSIS — Z7901 Long term (current) use of anticoagulants: Secondary | ICD-10-CM | POA: Insufficient documentation

## 2023-11-02 DIAGNOSIS — E119 Type 2 diabetes mellitus without complications: Secondary | ICD-10-CM | POA: Insufficient documentation

## 2023-11-02 DIAGNOSIS — K529 Noninfective gastroenteritis and colitis, unspecified: Secondary | ICD-10-CM | POA: Insufficient documentation

## 2023-11-02 DIAGNOSIS — R1084 Generalized abdominal pain: Secondary | ICD-10-CM

## 2023-11-02 DIAGNOSIS — R109 Unspecified abdominal pain: Secondary | ICD-10-CM | POA: Diagnosis present

## 2023-11-02 LAB — COMPREHENSIVE METABOLIC PANEL WITH GFR
ALT: 49 U/L — ABNORMAL HIGH (ref 0–44)
AST: 153 U/L — ABNORMAL HIGH (ref 15–41)
Albumin: 3.8 g/dL (ref 3.5–5.0)
Alkaline Phosphatase: 94 U/L (ref 38–126)
Anion gap: 14 (ref 5–15)
BUN: 13 mg/dL (ref 8–23)
CO2: 19 mmol/L — ABNORMAL LOW (ref 22–32)
Calcium: 9.7 mg/dL (ref 8.9–10.3)
Chloride: 104 mmol/L (ref 98–111)
Creatinine, Ser: 0.72 mg/dL (ref 0.44–1.00)
GFR, Estimated: >60 mL/min (ref >60)
Glucose, Bld: 109 mg/dL — ABNORMAL HIGH (ref 70–99)
Potassium: 4.9 mmol/L (ref 3.5–5.1)
Sodium: 138 mmol/L (ref 135–145)
Total Bilirubin: 0.5 mg/dL (ref 0.0–1.2)
Total Protein: 6.4 g/dL — ABNORMAL LOW (ref 6.5–8.1)

## 2023-11-02 LAB — URINALYSIS, ROUTINE W REFLEX MICROSCOPIC
Bacteria, UA: NONE SEEN
Bilirubin Urine: NEGATIVE
Glucose, UA: NEGATIVE mg/dL
Hgb urine dipstick: NEGATIVE
Ketones, ur: NEGATIVE mg/dL
Nitrite: NEGATIVE
Protein, ur: NEGATIVE mg/dL
Specific Gravity, Urine: 1.024 (ref 1.005–1.030)
pH: 5 (ref 5.0–8.0)

## 2023-11-02 LAB — CBC
HCT: 43.1 % (ref 36.0–46.0)
Hemoglobin: 11.6 g/dL — ABNORMAL LOW (ref 12.0–15.0)
MCH: 27.3 pg (ref 26.0–34.0)
MCHC: 26.9 g/dL — ABNORMAL LOW (ref 30.0–36.0)
MCV: 101.4 fL — ABNORMAL HIGH (ref 80.0–100.0)
Platelets: 100 K/uL — ABNORMAL LOW (ref 150–400)
RBC: 4.25 MIL/uL (ref 3.87–5.11)
RDW: 15 % (ref 11.5–15.5)
WBC: 7 K/uL (ref 4.0–10.5)
nRBC: 0 % (ref 0.0–0.2)

## 2023-11-02 LAB — LIPASE, BLOOD: Lipase: 87 U/L — ABNORMAL HIGH (ref 11–51)

## 2023-11-02 NOTE — ED Triage Notes (Signed)
 Pt presents to the ED via [facility] with complaints of chronic abdominal pain with associated diarrhea and polyuria x 2 days. She notes having 3-4 episodes of loose stool today. Pt is followed by GI for her chronic pain. A&Ox4 at this time. Denies hematuria, hematochezia, dizziness, N/V, CP or SOB.   VS with EMS 120/82 50 98% [id number]

## 2023-11-03 ENCOUNTER — Emergency Department (HOSPITAL_COMMUNITY)

## 2023-11-03 LAB — PROTIME-INR
INR: 1.1 (ref 0.8–1.2)
Prothrombin Time: 15.2 s (ref 11.4–15.2)

## 2023-11-03 MED ORDER — IOHEXOL 300 MG/ML  SOLN
100.0000 mL | Freq: Once | INTRAMUSCULAR | Status: AC | PRN
  Administered 2023-11-03: 100 mL via INTRAVENOUS

## 2023-11-03 MED ORDER — ONDANSETRON 4 MG PO TBDP
4.0000 mg | ORAL_TABLET | Freq: Once | ORAL | Status: AC
  Administered 2023-11-03: 4 mg via ORAL
  Filled 2023-11-03: qty 1

## 2023-11-03 MED ORDER — ONDANSETRON HCL 4 MG PO TABS: 4.0000 mg | ORAL_TABLET | Freq: Four times a day (QID) | ORAL | 0 refills | Status: DC

## 2023-11-03 MED ORDER — DICYCLOMINE HCL 20 MG PO TABS: 20.0000 mg | ORAL_TABLET | Freq: Three times a day (TID) | ORAL | 0 refills | Status: DC

## 2023-11-03 MED ORDER — DICYCLOMINE HCL 10 MG PO CAPS
10.0000 mg | ORAL_CAPSULE | Freq: Once | ORAL | Status: AC
  Administered 2023-11-03: 10 mg via ORAL
  Filled 2023-11-03: qty 1

## 2023-11-03 NOTE — ED Provider Notes (Signed)
 [place] EMERGENCY DEPARTMENT AT [facility] Provider Note   CSN: [id number] Arrival date & time: 11/02/23  2114     Patient presents with: Abdominal Pain   [name] is a 73 y.o. female.  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  Patient states symptoms began approximately 2 days ago when she ran out of her Bentyl  prescription.  Her husband states that he has been trying to get a refill of her prescription but has been unable to get a refill as of this time.  Patient also endorses some mild nausea without emesis.  She denies chest pain, fever, shortness of breath.    Abdominal Pain      Prior to Admission medications   Medication Sig Start Date End Date Taking? Authorizing Provider  dicyclomine  (BENTYL ) 20 MG tablet Take 1 tablet (20 mg total) by mouth 3 (three) times daily before meals for 14 days. 11/03/23 11/17/23 Yes Logan Ubaldo NOVAK, PA-C  ondansetron  (ZOFRAN ) 4 MG tablet Take 1 tablet (4 mg total) by mouth every 6 (six) hours. 11/03/23  Yes Logan Ubaldo NOVAK, PA-C  ACCU-CHEK GUIDE test strip  11/06/20   [provider]  acetaminophen  (TYLENOL ) 500 MG tablet Take 500-1,000 mg by mouth every 6 (six) hours as needed for mild pain or headache.    [provider]  Alcohol Swabs (ALCOHOL WIPES) 70 % PADS Use 1 swab as needed to clean site for blood sugar check. 11/06/20   [provider]  atenolol  (TENORMIN ) 25 MG tablet Take 1.5 tablets (37.5 mg total) by mouth daily. 09/18/19   Lee, Joshua K, MD  Blood Glucose Calibration [facility] CONTROL) SOLN  11/06/20   [provider]  Blood Glucose Monitoring Suppl (ACCU-CHEK GUIDE ME) w/Device KIT daily. 11/06/20   [provider]  Blood Glucose Monitoring Suppl (GLUCOCOM BLOOD GLUCOSE MONITOR) DEVI  11/06/20    [provider]  buPROPion (WELLBUTRIN XL) 150 MG 24 hr tablet Take 150 mg by mouth daily.    [provider]  Cholecalciferol  (VITAMIN D3) 50 MCG (2000 UT) TABS Take 2,000 Int'l Units by mouth daily.    [provider]  colestipol  (COLESTID ) 1 g tablet Take 2 tablets (2 g total) by mouth 2 (two) times daily. 09/29/23   [place], PA-C  Cyanocobalamin  (VITAMIN B-12) 5000 MCG TBDP Take 5,000 mcg by mouth daily.    [provider]  diazepam  (VALIUM ) 2 MG tablet Take 1 tablet (2 mg total) by mouth every 8 (eight) hours as needed for muscle spasms. 06/09/23   Silver Wonda LABOR, PA  diltiazem  (CARDIZEM  CD) 180 MG 24 hr capsule Take 1 capsule (180 mg total) by mouth daily. 03/13/19   Kerman Soulier, MD  DULoxetine  (CYMBALTA ) 60 MG capsule Take 1 capsule (60 mg total) by mouth daily. 03/13/19   Kerman Soulier, MD  ferrous sulfate  325 (65 FE) MG tablet Take 1 tablet (325 mg total) by mouth daily with breakfast. 10/13/23 11/12/23  [place], PA-C  glucose blood (PRECISION QID TEST) test strip  11/06/20   [provider]  Lancet Devices (SIMPLE DIAGNOSTICS LANCING DEV) MISC  11/06/20   [provider]  levothyroxine  (SYNTHROID ) 50 MCG tablet Take 1 tablet (50 mcg total) by mouth every morning. 03/13/19   Kerman Soulier, MD  lidocaine  (LIDODERM ) 5 % Place 1 patch onto the skin daily.  Remove & Discard patch within 12 hours or as directed by MD 06/09/23   Silver Wonda LABOR, PA  loperamide  (IMODIUM ) 2 MG capsule Take 2 capsules (4 mg total) by mouth 3 (three) times daily. 10/08/23 04/05/24  [place], PA-C  losartan  (COZAAR ) 25 MG tablet Take 1 tablet (25 mg total) by mouth daily. 03/13/19   Helberg, Eva, MD  metFORMIN  (GLUCOPHAGE -XR) 500 MG 24 hr tablet TAKE 2 TABLETS(1000 MG) BY MOUTH TWICE DAILY WITH FOOD Patient taking differently: Take 500 mg by mouth 2 (two) times daily with a meal. TAKE 1 TABLETS(1000 MG) BY MOUTH TWICE DAILY WITH FOOD starting 10/08/2023  12/12/19   Selene Henderson, MD  omeprazole  (PRILOSEC) 40 MG capsule Take 40 mg by mouth daily.    [provider]  ondansetron  (ZOFRAN -ODT) 4 MG disintegrating tablet Take 4 mg by mouth every 8 (eight) hours as needed for vomiting or nausea. 08/02/22   [provider]  potassium chloride  SA (KLOR-CON  M) 20 MEQ tablet Take 1 tablet (20 mEq total) by mouth once for 1 dose. 10/13/23 10/13/23  [place], PA-C  rosuvastatin  (CRESTOR ) 20 MG tablet Take 1 tablet (20 mg total) by mouth daily. 03/13/19   Kerman Eva, MD  traMADol  (ULTRAM ) 50 MG tablet Take 50 mg by mouth every 6 (six) hours as needed for moderate pain. 12/04/22   [provider]  warfarin (COUMADIN ) 2.5 MG tablet TAKE 1 TO 1 AND 1/2 TABLETS BY MOUTH DAILY AS DIRECTED BY COUMADIN  CLINIC 08/18/23   Lavona Agent, MD    Allergies: Gemfibrozil, Latex, Penicillins, Adhesive [tape], Codeine, Digoxin  and related, Metformin  and related, Omeprazole , and Ace inhibitors    Review of Systems  Gastrointestinal:  Positive for abdominal pain.    Updated Vital Signs BP 134/72   Pulse 74   Temp 97.6 F (36.4 C) (Oral)   Resp 17   Ht 5' 2 (1.575 m)   Wt 69.8 kg   LMP 06/03/1978   SpO2 98%   BMI 28.15 kg/m   Physical Exam Vitals and nursing note reviewed.  Constitutional:      General: She is not in acute distress.    Appearance: She is well-developed.  HENT:     Head: Normocephalic and atraumatic.  Eyes:     Conjunctiva/sclera: Conjunctivae normal.  Cardiovascular:     Rate and Rhythm: Normal rate and regular rhythm.     Heart sounds: No murmur heard. Pulmonary:     Effort: Pulmonary effort is normal. No respiratory distress.     Breath sounds: Normal breath sounds.  Abdominal:     Palpations: Abdomen is soft.     Tenderness: There is no abdominal tenderness.     Comments: No significant abdominal tenderness on exam  Musculoskeletal:        General: No swelling.     Cervical back: Neck supple.  Skin:     General: Skin is warm and dry.     Capillary Refill: Capillary refill takes less than 2 seconds.  Neurological:     Mental Status: She is alert.  Psychiatric:        Mood and Affect: Mood normal.     (all labs ordered are listed, but only abnormal results are displayed) Labs Reviewed  LIPASE, BLOOD - Abnormal; Notable for the following components:      Result Value   Lipase 87 (*)    All other components within normal limits  COMPREHENSIVE METABOLIC PANEL WITH GFR - Abnormal; Notable for the following  components:   CO2 19 (*)    Glucose, Bld 109 (*)    Total Protein 6.4 (*)    AST 153 (*)    ALT 49 (*)    All other components within normal limits  CBC - Abnormal; Notable for the following components:   Hemoglobin 11.6 (*)    MCV 101.4 (*)    MCHC 26.9 (*)    Platelets 100 (*)    All other components within normal limits  URINALYSIS, ROUTINE W REFLEX MICROSCOPIC - Abnormal; Notable for the following components:   Leukocytes,Ua TRACE (*)    All other components within normal limits  PROTIME-INR    EKG: None  Radiology: CT ABDOMEN PELVIS W CONTRAST Result Date: 11/03/2023 EXAM: CT ABDOMEN AND PELVIS WITH CONTRAST 11/03/2023 05:42:04 AM TECHNIQUE: CT of the abdomen and pelvis was performed with the administration of intravenous contrast. Multiplanar reformatted images are provided for review. Automated exposure control, iterative reconstruction, and/or weight-based adjustment of the mA/kV was utilized to reduce the radiation dose to as low as reasonably achievable. COMPARISON: CT of the abdomen and pelvis dated 09/05/2022. CLINICAL HISTORY: Abdominal pain, acute, nonlocalized. Chronic abdominal pain with associated diarrhea and polyuria x 2 days. She notes having 3-4 episodes of loose stool today. Pt is followed by GI for her chronic pain. FINDINGS: LOWER CHEST: Visualized lung bases are clear. LIVER: Normal size and contour. GALLBLADDER AND BILE DUCTS: Status post  cholecystectomy. SPLEEN: Normal size. No focal lesion. PANCREAS: No mass. No ductal dilatation. ADRENAL GLANDS: Normal appearance. No mass. KIDNEYS, URETERS AND BLADDER: No stones in the kidneys or ureters. No hydronephrosis. No perinephric or periureteral stranding. Urinary bladder is unremarkable. GI AND BOWEL: Numerous colonic diverticula present. Questionable mild colitis/diverticulitis of the proximal sigmoid colon. No bowel obstruction. PERITONEUM AND RETROPERITONEUM: No ascites. No free air. VASCULATURE: Mild-to-moderate calcific atheromatous disease within the abdominal aorta. LYMPH NODES: No lymphadenopathy. REPRODUCTIVE ORGANS: Status post hysterectomy and bilateral salpingo-oophorectomy. BONES AND SOFT TISSUES: No acute osseous abnormality. No focal soft tissue abnormality. IMPRESSION: 1. Questionable mild colitis/diverticulitis of the proximal sigmoid colon. Electronically signed by: Evalene Coho MD 11/03/2023 06:13 AM EDT RP Workstation: [id number]     Procedures   Medications Ordered in the ED  dicyclomine  (BENTYL ) capsule 10 mg (10 mg Oral Given by Other 11/03/23 0220)  ondansetron  (ZOFRAN -ODT) disintegrating tablet 4 mg (4 mg Oral Given 11/03/23 0220)  iohexol  (OMNIPAQUE ) 300 MG/ML solution 100 mL (100 mLs Intravenous Contrast Given 11/03/23 0529)                                    Medical Decision Making Amount and/or Complexity of Data Reviewed Labs: ordered. Radiology: ordered.  Risk Prescription drug management.   This patient presents to the ED for concern of abdominal pain with diarrhea, this involves an extensive number of treatment options, and is a complaint that carries with it a high risk of complications and morbidity.  The differential diagnosis includes IBS flare, colitis, appendicitis, others   Co morbidities / Chronic conditions that complicate the patient evaluation  Chronic diarrhea   Additional history obtained:  Additional history obtained from  EMR and family at bedside External records from outside source obtained and reviewed including GI notes   Lab Tests:  I Ordered, and personally interpreted labs.  The pertinent results include: AST 153, lipase 87, grossly unremarkable UA, unremarkable PT/INR   Imaging Studies ordered:  I ordered  imaging studies including CT abdomen pelvis with contrast I independently visualized and interpreted imaging which showed Questionable mild colitis/diverticulitis of the proximal sigmoid colon  I agree with the radiologist interpretation   Problem List / ED Course / Critical interventions / Medication management   I ordered medication including Bentyl , Zofran  Reevaluation of the patient after these medicines showed that the patient improved I have reviewed the patients home medicines and have made adjustments as needed   Test / Admission - Considered:  Patient tolerating oral intake.  Based on examination and history, imaging feel this may be a mild colitis.  Her symptoms did coincide with the time when she ran out of her Bentyl .  I have prescribed a short course of Bentyl  and Zofran  for diarrhea/spasms and nausea.  Patient has upcoming appointment in approximately 1-1/2 weeks with her primary care team where she will be able to discuss her current conditions and request refills if needed.  I did inform the patient about elevated liver enzymes of unclear significance at this time.  Patient will also discuss these with her primary care team.  Patient appears stable for discharge home.  I do not see an indication for further emergent workup or admission at this time.      Final diagnoses:  Generalized abdominal pain  Colitis  Nausea    ED Discharge Orders          Ordered    dicyclomine  (BENTYL ) 20 MG tablet  3 times daily before meals        11/03/23 0621    ondansetron  (ZOFRAN ) 4 MG tablet  Every 6 hours        11/03/23 [id number]               Logan Ubaldo KATHEE DEVONNA 11/03/23  ZOILA Griselda Norris, MD 11/03/23 [phone]

## 2023-11-03 NOTE — Discharge Instructions (Signed)
 Your CT scan this morning showed possible mild colitis or diverticulitis.  I have prescribed Bentyl  to help with your spasms and Zofran  to help with your nausea.  You also had mildly elevated liver enzymes of unclear significance this morning.  Please follow-up with a primary care doctor for repeat labs and for further evaluation.  If you develop any life-threatening symptoms return to the emergency department.

## 2023-11-05 ENCOUNTER — Other Ambulatory Visit: Payer: Self-pay | Admitting: Physician Assistant

## 2023-11-13 ENCOUNTER — Other Ambulatory Visit: Payer: Self-pay | Admitting: Physician Assistant

## 2023-11-16 ENCOUNTER — Ambulatory Visit: Admitting: Physical Medicine and Rehabilitation

## 2023-11-16 ENCOUNTER — Other Ambulatory Visit: Payer: Self-pay

## 2023-11-16 VITALS — BP 128/72 | HR 89

## 2023-11-16 DIAGNOSIS — M5416 Radiculopathy, lumbar region: Secondary | ICD-10-CM

## 2023-11-16 MED ORDER — METHYLPREDNISOLONE ACETATE 40 MG/ML IJ SUSP
40.0000 mg | Freq: Once | INTRAMUSCULAR | Status: AC
  Administered 2023-11-16: 40 mg

## 2023-11-16 NOTE — Procedures (Signed)
 Lumbosacral Transforaminal Epidural Steroid Injection - Sub-Pedicular Approach with Fluoroscopic Guidance  Patient: [name]      Date of Birth: April 06, 1950 MRN: [id number] PCP: Zena Frederick, DO      Visit Date: 11/16/2023   Universal Protocol:    Date/Time: 11/16/2023  Consent Given By: the patient  Position: PRONE  Additional Comments: Vital signs were monitored before and after the procedure. Patient was prepped and draped in the usual sterile fashion. The correct patient, procedure, and site was verified.   Injection Procedure Details:   Procedure diagnoses: Lumbar radiculopathy [M54.16]    Meds Administered:  Meds ordered this encounter  Medications   methylPREDNISolone  acetate (DEPO-MEDROL ) injection 40 mg    Laterality: Right  Location/Site: L5  Needle:5.0 in., 22 ga.  Short bevel or Quincke spinal needle  Needle Placement: Transforaminal  Findings:    -Comments: Excellent flow of contrast along the nerve, nerve root and into the epidural space.  Procedure Details: After squaring off the end-plates to get a true AP view, the C-arm was positioned so that an oblique view of the foramen as noted above was visualized. The target area is just inferior to the nose of the scotty dog or sub pedicular. The soft tissues overlying this structure were infiltrated with 2-3 ml. of 1% Lidocaine  without Epinephrine .  The spinal needle was inserted toward the target using a trajectory view along the fluoroscope beam.  Under AP and lateral visualization, the needle was advanced so it did not puncture dura and was located close the 6 O'Clock position of the pedical in AP tracterory. Biplanar projections were used to confirm position. Aspiration was confirmed to be negative for CSF and/or blood. A 1-2 ml. volume of Isovue -250 was injected and flow of contrast was noted at each level. Radiographs were obtained for documentation purposes.   After attaining the desired  flow of contrast documented above, a 0.5 to 1.0 ml test dose of 0.25% Marcaine  was injected into each respective transforaminal space.  The patient was observed for 90 seconds post injection.  After no sensory deficits were reported, and normal lower extremity motor function was noted,   the above injectate was administered so that equal amounts of the injectate were placed at each foramen (level) into the transforaminal epidural space.   Additional Comments:  The patient tolerated the procedure well Dressing: 2 x 2 sterile gauze and Band-Aid    Post-procedure details: Patient was observed during the procedure. Post-procedure instructions were reviewed.  Patient left the clinic in stable condition.

## 2023-11-16 NOTE — Progress Notes (Signed)
 Pain Scale   Average Pain 10 Patient advising she has lower back pain radiating bilaterally to legs with constant pain        +Driver, -BT, -Dye Allergies.

## 2023-11-16 NOTE — Progress Notes (Signed)
 [name] - 73 y.o. female MRN [id number]  Date of birth: 05/16/1950  Office Visit Note: Visit Date: 11/16/2023 PCP: Zena Frederick, DO Referred by: Zena Frederick, DO  Subjective: Chief Complaint  Patient presents with   Lower Back - Pain   HPI:  [name] is a 73 y.o. female who comes in today at the request of Duwaine Pouch, FNP for planned Right L5-S1 Lumbar Transforaminal epidural steroid injection with fluoroscopic guidance.  The patient has failed conservative care including home exercise, medications, time and activity modification.  This injection will be diagnostic and hopefully therapeutic.  Please see requesting physician notes for further details and justification.   ROS Otherwise per HPI.  Assessment & Plan: Visit Diagnoses:    ICD-10-CM   1. Lumbar radiculopathy  M54.16 XR C-ARM NO REPORT    Epidural Steroid injection    methylPREDNISolone  acetate (DEPO-MEDROL ) injection 40 mg      Plan: No additional findings.   Meds & Orders:  Meds ordered this encounter  Medications   methylPREDNISolone  acetate (DEPO-MEDROL ) injection 40 mg    Orders Placed This Encounter  Procedures   XR C-ARM NO REPORT   Epidural Steroid injection    Follow-up: Return for visit to requesting provider as needed.   Procedures: No procedures performed  Lumbosacral Transforaminal Epidural Steroid Injection - Sub-Pedicular Approach with Fluoroscopic Guidance  Patient: [name]      Date of Birth: 1950/07/24 MRN: [id number] PCP: Zena Frederick, DO      Visit Date: 11/16/2023   Universal Protocol:    Date/Time: 11/16/2023  Consent Given By: the patient  Position: PRONE  Additional Comments: Vital signs were monitored before and after the procedure. Patient was prepped and draped in the usual sterile fashion. The correct patient, procedure, and site was verified.   Injection Procedure Details:   Procedure diagnoses: Lumbar radiculopathy [M54.16]     Meds Administered:  Meds ordered this encounter  Medications   methylPREDNISolone  acetate (DEPO-MEDROL ) injection 40 mg    Laterality: Right  Location/Site: L5  Needle:5.0 in., 22 ga.  Short bevel or Quincke spinal needle  Needle Placement: Transforaminal  Findings:    -Comments: Excellent flow of contrast along the nerve, nerve root and into the epidural space.  Procedure Details: After squaring off the end-plates to get a true AP view, the C-arm was positioned so that an oblique view of the foramen as noted above was visualized. The target area is just inferior to the nose of the scotty dog or sub pedicular. The soft tissues overlying this structure were infiltrated with 2-3 ml. of 1% Lidocaine  without Epinephrine .  The spinal needle was inserted toward the target using a trajectory view along the fluoroscope beam.  Under AP and lateral visualization, the needle was advanced so it did not puncture dura and was located close the 6 O'Clock position of the pedical in AP tracterory. Biplanar projections were used to confirm position. Aspiration was confirmed to be negative for CSF and/or blood. A 1-2 ml. volume of Isovue -250 was injected and flow of contrast was noted at each level. Radiographs were obtained for documentation purposes.   After attaining the desired flow of contrast documented above, a 0.5 to 1.0 ml test dose of 0.25% Marcaine  was injected into each respective transforaminal space.  The patient was observed for 90 seconds post injection.  After no sensory deficits were reported, and normal lower extremity motor function was noted,   the above injectate was administered so  that equal amounts of the injectate were placed at each foramen (level) into the transforaminal epidural space.   Additional Comments:  The patient tolerated the procedure well Dressing: 2 x 2 sterile gauze and Band-Aid    Post-procedure details: Patient was observed during the  procedure. Post-procedure instructions were reviewed.  Patient left the clinic in stable condition.    Clinical History: EXAM: MRI LUMBAR SPINE WITHOUT CONTRAST  TECHNIQUE: Multiplanar, multisequence MR imaging of the lumbar spine was performed. No intravenous contrast was administered.  COMPARISON:  06/01/2013  FINDINGS: Segmentation:  Standard.  Alignment:  Minimal grade 1 anterolisthesis of L4 on L5.  Vertebrae: No acute fracture, evidence of discitis, or aggressive bone lesion. Schmorl's node along the superior and inferior endplate of L3.  Conus medullaris and cauda equina: Conus extends to the L1-2 level. Conus and cauda equina appear normal.  Paraspinal and other soft tissues: No acute paraspinal abnormality. Small area of fat necrosis in the left posterior subcutaneous fat.  Disc levels:  Disc spaces: Mild disc height loss at L2-3 and L4-5.  T12-L1: No significant disc bulge. No neural foraminal stenosis. No central canal stenosis.  L1-L2: Mild broad-based disc bulge. Mild bilateral facet arthropathy. No foraminal or central canal stenosis.  L2-L3: Moderate disc bulge. Moderate bilateral facet arthropathy. Mild central canal stenosis. No foraminal stenosis.  L3-L4: Mild disc bulge. Mild bilateral facet arthropathy. No foraminal or central canal stenosis.  L4-L5: Mild disc bulge with a broad central disc protrusion. Bilateral subarticular recess stenosis which may impinge upon the L5 nerve roots. Mild central canal stenosis. Moderate bilateral facet arthropathy. Mild bilateral foraminal stenosis.  L5-S1: Mild disc bulge. Moderate bilateral facet arthropathy. No foraminal or central canal stenosis.  IMPRESSION: 1. At L4-5 there is a mild disc bulge with a broad central disc protrusion. Bilateral subarticular recess stenosis which may impinge upon the L5 nerve roots. Mild central canal stenosis. Moderate bilateral facet arthropathy. Mild bilateral  foraminal stenosis. 2. At L2-3 there is a moderate disc bulge. Moderate bilateral facet arthropathy. Mild central canal stenosis. 3. No acute osseous injury of the lumbar spine.   Electronically Signed   By: Julaine Blanch M.D.   On: 02/14/2023 13:33 Exam End: 01/27/23 14:16     Objective:  VS:  HT:    WT:   BMI:     BP:128/72  HR:89bpm  TEMP: ( )  RESP:  Physical Exam Vitals and nursing note reviewed.  Constitutional:      General: She is not in acute distress.    Appearance: Normal appearance. She is not ill-appearing.  HENT:     Head: Normocephalic and atraumatic.     Right Ear: External ear normal.     Left Ear: External ear normal.  Eyes:     Extraocular Movements: Extraocular movements intact.  Cardiovascular:     Rate and Rhythm: Normal rate.     Pulses: Normal pulses.  Pulmonary:     Effort: Pulmonary effort is normal. No respiratory distress.  Abdominal:     General: There is no distension.     Palpations: Abdomen is soft.  Musculoskeletal:        General: Tenderness present.     Cervical back: Neck supple.     Right lower leg: No edema.     Left lower leg: No edema.     Comments: Patient has good distal strength with no pain over the greater trochanters.  No clonus or focal weakness.  Skin:    Findings:  No erythema, lesion or rash.  Neurological:     General: No focal deficit present.     Mental Status: She is alert and oriented to person, place, and time.     Sensory: No sensory deficit.     Motor: No weakness or abnormal muscle tone.     Coordination: Coordination normal.  Psychiatric:        Mood and Affect: Mood normal.        Behavior: Behavior normal.      Imaging: No results found.

## 2023-11-17 ENCOUNTER — Other Ambulatory Visit: Payer: Self-pay | Admitting: Cardiology

## 2023-11-17 DIAGNOSIS — I482 Chronic atrial fibrillation, unspecified: Secondary | ICD-10-CM

## 2023-11-18 ENCOUNTER — Ambulatory Visit: Attending: Cardiology | Admitting: *Deleted

## 2023-11-18 DIAGNOSIS — I4891 Unspecified atrial fibrillation: Secondary | ICD-10-CM | POA: Diagnosis not present

## 2023-11-18 DIAGNOSIS — Z5181 Encounter for therapeutic drug level monitoring: Secondary | ICD-10-CM | POA: Diagnosis not present

## 2023-11-18 DIAGNOSIS — I482 Chronic atrial fibrillation, unspecified: Secondary | ICD-10-CM

## 2023-11-18 LAB — POCT INR: POC INR: 1.8

## 2023-11-18 MED ORDER — WARFARIN SODIUM 2.5 MG PO TABS: ORAL_TABLET | ORAL | 0 refills | Status: DC

## 2023-11-18 NOTE — Patient Instructions (Signed)
 Description   Take 2 tablets of warfarin today and then START taking warfarin 1.5 tablets daily. Recheck INR in 2 weeks. Stay consistent with greens each week (1 per week).   Anticoagulation Clinic [phone]

## 2023-11-18 NOTE — Progress Notes (Signed)
 INR 1.8; Please see anticoagulation encounter Lab Results  Component Value Date   INR 1.8 11/18/2023   INR 1.1 11/03/2023   INR 1.8 (A) 10/26/2023   PROTIME 18.8 08/20/2008    Description   Take 2 tablets of warfarin today and then START taking warfarin 1.5 tablets daily. Recheck INR in 2 weeks. Stay consistent with greens each week (1 per week).   Anticoagulation Clinic [phone]

## 2023-11-22 ENCOUNTER — Telehealth: Payer: Self-pay | Admitting: Physical Medicine and Rehabilitation

## 2023-11-22 ENCOUNTER — Other Ambulatory Visit: Payer: Self-pay | Admitting: Physical Medicine and Rehabilitation

## 2023-11-22 NOTE — Telephone Encounter (Signed)
 Patient called and said that her back is in pain bad and can you send something for pain. CB#[phone]

## 2023-12-03 ENCOUNTER — Ambulatory Visit

## 2023-12-08 ENCOUNTER — Telehealth: Payer: Self-pay

## 2023-12-08 ENCOUNTER — Ambulatory Visit: Admitting: Physician Assistant

## 2023-12-08 ENCOUNTER — Other Ambulatory Visit (INDEPENDENT_AMBULATORY_CARE_PROVIDER_SITE_OTHER)

## 2023-12-08 ENCOUNTER — Other Ambulatory Visit (HOSPITAL_COMMUNITY): Payer: Self-pay

## 2023-12-08 ENCOUNTER — Encounter: Payer: Self-pay | Admitting: Physician Assistant

## 2023-12-08 VITALS — BP 114/60 | HR 69 | Ht 61.0 in | Wt 140.5 lb

## 2023-12-08 DIAGNOSIS — K580 Irritable bowel syndrome with diarrhea: Secondary | ICD-10-CM

## 2023-12-08 DIAGNOSIS — K529 Noninfective gastroenteritis and colitis, unspecified: Secondary | ICD-10-CM

## 2023-12-08 DIAGNOSIS — K625 Hemorrhage of anus and rectum: Secondary | ICD-10-CM

## 2023-12-08 DIAGNOSIS — K648 Other hemorrhoids: Secondary | ICD-10-CM | POA: Diagnosis not present

## 2023-12-08 DIAGNOSIS — Z860101 Personal history of adenomatous and serrated colon polyps: Secondary | ICD-10-CM

## 2023-12-08 DIAGNOSIS — R109 Unspecified abdominal pain: Secondary | ICD-10-CM

## 2023-12-08 MED ORDER — DICYCLOMINE HCL 20 MG PO TABS: 20.0000 mg | ORAL_TABLET | Freq: Three times a day (TID) | ORAL | 2 refills | Status: DC

## 2023-12-08 NOTE — Progress Notes (Signed)
 Ellouise Console, PA-C [address] [place], [place]  [address] Phone: [phone]   Primary Care Physician: Zena Frederick, DO  Primary Gastroenterologist:  Ellouise Console, PA-C / Norleen Kiang, MD   Chief Complaint: Follow-up diarrhea and rectal bleeding       HPI:   [name] is a 73 y.o. female returns for 63-month follow-up of chronic diarrhea and rectal bleeding.  Patient has moderate dementia and is here with her husband who helps with her care.  Current Symptoms: She continues to have chronic intermittent episodes of diarrhea and rectal bleeding.  It does not occur every day.  Diarrhea has lessened.  Still occurs after eating meals.  Typically eats 2 meals per day.  She tried IBgard with no benefit.  Not currently taking Imodium , cholestyramine , or Colestid .  She tried hydrocortisone  suppositories for hemorrhoid bleeding which helped mildly.  Previous cholecystectomy.   11/29/2023 labs: Normal CMP and CBC, WBC 6.0, Hgb 12.3.  A1c 6.2.  Normal vitamin D .     She has had chronic diarrhea for many years.  No recent antibiotics or new medications.  Previous colonoscopies have shown biopsies negative for microscopic colitis and IBD.  Fecal pancreatic elastase was normal 03/2022, no evidence of EPI.  She has had previous cholecystectomy and history of IBS.  She has had intermittent episodes of rectal bleeding on and off for many years, attributed to hemorrhoids.  Previous colonoscopies have shown internal hemorrhoids.  Currently on Coumadin  for A-fib.     08/2023: Negative GI pathogen panel.  Negative C. difficile. 08/06/2023:  labs showed normal hemoglobin 12.0. 03/2022 normal fecal pancreatic elastase greater than 500. 03/2022 negative fecal lactoferrin.   PMH: A-fib, TIA, carotid stenosis, HTN, type 2 diabetes, mild dementia, hypothyroidism, GERD, hemorrhoids, fatty liver, history of hiatal hernia repair.  Currently on Coumadin .  History of gastritis and prior cholecystectomy.   History colon polyps.  History of chronic diarrhea and IBS.   11/2022 last colonoscopy (to evaluate blood in stool) at [facility]: Showed no polyps.  Colon biopsies were negative for microscopic colitis.  There was small internal hemorrhoids and pan diverticulosis.   02/2021 EGD by Dr. Kiang: Normal.  S/p fundoplication.   02/2021 colonoscopy by Dr. Kiang: 2 small polyps removed -1 small tubular adenoma and 1 colon mucosal polyp.  Pandiverticulosis.  Excellent prep.   04/2017 colonoscopy by Dr. Kiang: 4 small polyps removed.  1 sessile serrated and 3 tubular adenoma polyps removed.  No dysplasia.  Excellent prep.  Current Outpatient Medications  Medication Sig Dispense Refill   ACCU-CHEK GUIDE test strip      acetaminophen  (TYLENOL ) 500 MG tablet Take 500-1,000 mg by mouth every 6 (six) hours as needed for mild pain or headache.     Alcohol Swabs (ALCOHOL WIPES) 70 % PADS Use 1 swab as needed to clean site for blood sugar check.     amphetamine-dextroamphetamine (ADDERALL) 10 MG tablet Take 10 mg by mouth.     atenolol  (TENORMIN ) 25 MG tablet Take 1.5 tablets (37.5 mg total) by mouth daily. 135 tablet 3   Blood Glucose Calibration (MYGLUCOHEALTH CONTROL) SOLN      Blood Glucose Monitoring Suppl (ACCU-CHEK GUIDE ME) w/Device KIT daily.     Blood Glucose Monitoring Suppl (GLUCOCOM BLOOD GLUCOSE MONITOR) DEVI      buPROPion (WELLBUTRIN XL) 150 MG 24 hr tablet Take 150 mg by mouth daily.     Cholecalciferol  (VITAMIN D3) 50 MCG (2000 UT)  TABS Take 2,000 Int'l Units by mouth daily.     colestipol  (COLESTID ) 1 g tablet Take 2 tablets (2 g total) by mouth 2 (two) times daily. 120 tablet 5   Cyanocobalamin  (VITAMIN B-12) 5000 MCG TBDP Take 5,000 mcg by mouth daily.     diazepam  (VALIUM ) 2 MG tablet Take 1 tablet (2 mg total) by mouth every 8 (eight) hours as needed for muscle spasms. 8 tablet 0   DILT-XR 180 MG 24 hr capsule Take 180 mg by mouth daily.     diltiazem  (CARDIZEM  CD) 180 MG  24 hr capsule Take 1 capsule (180 mg total) by mouth daily. 90 capsule 3   donepezil (ARICEPT) 10 MG tablet Take 10 mg by mouth daily.     DULoxetine  (CYMBALTA ) 60 MG capsule Take 1 capsule (60 mg total) by mouth daily. 90 capsule 3   escitalopram (LEXAPRO) 20 MG tablet Take 20 mg by mouth daily.     ferrous sulfate  325 (65 FE) MG tablet TAKE 1 TABLET BY MOUTH EVERY DAY WITH BREAKFAST 90 tablet 1   glucose blood (PRECISION QID TEST) test strip      Lancet Devices (SIMPLE DIAGNOSTICS LANCING DEV) MISC      levothyroxine  (SYNTHROID ) 50 MCG tablet Take 1 tablet (50 mcg total) by mouth every morning. 90 tablet 3   lidocaine  (LIDODERM ) 5 % Place 1 patch onto the skin daily. Remove & Discard patch within 12 hours or as directed by MD 30 patch 0   loperamide  (IMODIUM ) 2 MG capsule Take 2 capsules (4 mg total) by mouth 3 (three) times daily. 180 capsule 5   LORazepam  (ATIVAN ) 0.5 MG tablet Take 1.5 mg by mouth.     losartan  (COZAAR ) 25 MG tablet Take 1 tablet (25 mg total) by mouth daily. 90 tablet 3   metFORMIN  (GLUCOPHAGE -XR) 500 MG 24 hr tablet TAKE 2 TABLETS(1000 MG) BY MOUTH TWICE DAILY WITH FOOD (Patient taking differently: Take 500 mg by mouth daily with breakfast. TAKE 1 TABLETS(1000 MG) BY MOUTH TWICE DAILY WITH FOOD starting 10/08/2023) 360 tablet 1   omeprazole  (PRILOSEC) 40 MG capsule Take 40 mg by mouth daily.     ondansetron  (ZOFRAN -ODT) 4 MG disintegrating tablet Take 4 mg by mouth every 8 (eight) hours as needed for vomiting or nausea.     potassium chloride  SA (KLOR-CON  M) 20 MEQ tablet Take 1 tablet (20 mEq total) by mouth once for 1 dose. 30 tablet 0   predniSONE  (STERAPRED UNI-PAK 21 TAB) 5 MG (21) TBPK tablet SMARTSIG:- Tablet(s) By Mouth -     rosuvastatin  (CRESTOR ) 20 MG tablet Take 1 tablet (20 mg total) by mouth daily. 90 tablet 3   traMADol  (ULTRAM ) 50 MG tablet Take 50 mg by mouth every 6 (six) hours as needed for moderate pain.     warfarin (COUMADIN ) 2.5 MG tablet TAKE 1 AND 1/2  TABLETS BY MOUTH DAILY AS DIRECTED BY COUMADIN  CLINIC 150 tablet 0   dicyclomine  (BENTYL ) 20 MG tablet Take 1 tablet (20 mg total) by mouth 3 (three) times daily before meals. 90 tablet 2   No current facility-administered medications for this visit.    Allergies as of 12/08/2023 - Review Complete 12/08/2023  Allergen Reaction Noted   Gemfibrozil Swelling    Latex Other (See Comments)    Penicillins Hives 03/11/2010   Adhesive [tape] Other (See Comments) 05/26/2011   Codeine Hives    Digoxin  and related  10/05/2011   Metformin  and related Diarrhea 05/07/2014  Omeprazole  Diarrhea 05/07/2014   Ace inhibitors Rash 10/03/2012    Past Medical History:  Diagnosis Date   Abnormal CT scan 03/16/2017   Anemia    Anxiety    Atrial fibrillation (HCC)    Bunion    Carotid stenosis    Carpal tunnel syndrome    mild    Cervical arthritis    Cholecystitis    s/p cholecystectomy   Chronic back pain    Chronic gastritis    COVID-19 2020   Depression    Diabetic peripheral neuropathy (HCC)    Diarrhea 04/05/2015   Digoxin  toxicity    08/2011   Diverticulitis    dx 04/08/14   Diverticulosis    Dysphagia    no documented strictures but responded positively to dilation in past.    Dysrhythmia    Atrial Fibrillation   Epileptic seizure, tonic (HCC)    .No meds since age of 63.   Family history of adverse reaction to anesthesia    my mother may have   Fatty liver    Fibromyalgia    GERD (gastroesophageal reflux disease)    H/O hiatal hernia    S/P hernia repair   Hyperlipidemia    Hypertension    Hypothyroid    Internal hemorrhoid    Joint pain 05/08/2011   Left knee pain 12/19/2014   Migraine    Mild cognitive impairment    Muscle spasm of back 08/10/2013   Nonspecific (abnormal) findings on radiological and other examination of gastrointestinal tract 08/07/2011   OSA (obstructive sleep apnea) dx'd 2003   can't sleep w/that mask; lost some weight; maybe that's helped  (04/26/2014)   Osteoarthritis    Pneumonia X 2   Right foot pain 08/03/2013   Sarcoidosis    Seizures (HCC)    as a child, they stopped after age 49   Skin neoplasm    Stroke (HCC)    Hx TIA   TIA (transient ischemic attack)    don't know when I had it (04/26/2014)   Transaminase or LDH elevation    Type II diabetes mellitus (HCC)    Ventral hernia s/p laparoscopic lysis of adhesions and hernia repair with mesh 07/29/11 08/01/2011    Past Surgical History:  Procedure Laterality Date   ABDOMINAL HYSTERECTOMY  1980's   partial   BILATERAL OOPHORECTOMY  1980's   after partial hysterectomy   BREAST BIOPSY Right    benign   BREAST EXCISIONAL BIOPSY Right 03/2006   benign   BREAST SURGERY Right ~ 2010   excision milk duct;   BUNIONECTOMY Bilateral    CATARACT EXTRACTION W/PHACO  10/27/2010   Procedure: CATARACT EXTRACTION PHACO AND INTRAOCULAR LENS PLACEMENT (IOC);  Surgeon: Dow JULIANNA Burke;  Location: [facility];  Service: Ophthalmology;  Laterality: Left;  CDE- 1.78   CATARACT EXTRACTION W/PHACO  07/13/2011   Procedure: CATARACT EXTRACTION PHACO AND INTRAOCULAR LENS PLACEMENT (IOC);  Surgeon: Dow JULIANNA Burke, MD;  Location: [facility];  Service: Ophthalmology;  Laterality: Right;  CDE:  1.65   DILATION AND CURETTAGE OF UTERUS  1970; 1976   ESOPHAGEAL DILATION  multiple   EYE MUSCLE SURGERY Bilateral 2022   FOOT SURGERY Left ~ 2011   straightened toe & scraped bone below big toe   FOOT SURGERY Right ~ 2011   scraped bone of big toe; shortened middle toet   HIATAL HERNIA REPAIR  1990   had to have scar tissue removed 6 months after repair   INGUINAL HERNIA REPAIR  Bilateral 12/30/2022   Procedure: BILATERAL OPEN INGUINAL HERNIA REPAIR WITH MESH;  Surgeon: Polly Cordella LABOR, MD;  Location: [facility];  Service: General;  Laterality: Bilateral;  CHOICE & TAP BLOCK   Inverted nipples  1992   LAPAROSCOPIC CHOLECYSTECTOMY  1980's   LIPOSUCTION  1992   SALPINGOOPHORECTOMY Bilateral     6 months or so after hysterectomy   SKIN CANCER EXCISION  1990's   front side of right shin   TUBAL LIGATION  1976   VENTRAL HERNIA REPAIR  07/29/2011   Procedure: LAPAROSCOPIC VENTRAL HERNIA;  Surgeon: Elon CHRISTELLA Pacini, MD;  Location: [facility];  Service: General;  Laterality: N/A;  multiple incarcerated hernias with mesh    Review of Systems:    All systems reviewed and negative except where noted in HPI.    Physical Exam:  BP 114/60   Pulse 69   Ht 5' 1 (1.549 m)   Wt 140 lb 8 oz (63.7 kg)   LMP 06/03/1978   BMI 26.55 kg/m  Patient's last menstrual period was 06/03/1978.  General: Well-nourished, well-developed in no acute distress.  Lungs: Clear to auscultation bilaterally. Non-labored. Heart: Regular rate and rhythm, no murmurs rubs or gallops.  Abdomen: Bowel sounds are normal; Abdomen is Soft; No hepatosplenomegaly, masses or hernias;  No Abdominal Tenderness; No guarding or rebound tenderness. Neuro: Alert and oriented x 3.  Grossly intact.  Psych: Alert and cooperative, normal mood and affect.   Imaging Studies: XR C-ARM NO REPORT Result Date: 11/16/2023 Please see Notes tab for imaging impression.  Epidural Steroid injection Result Date: 11/16/2023 Eldonna Novel, MD     11/16/2023 10:45 AM Lumbosacral Transforaminal Epidural Steroid Injection - Sub-Pedicular Approach with Fluoroscopic Guidance Patient: Giannah Zavadil     Date of Birth: [birth year] MRN: [id number] PCP: Zena Frederick, DO     Visit Date: 11/16/2023  Universal Protocol:   Date/Time: 11/16/2023 Consent Given By: the patient Position: PRONE Additional Comments: Vital signs were monitored before and after the procedure. Patient was prepped and draped in the usual sterile fashion. The correct patient, procedure, and site was verified. Injection Procedure Details: Procedure diagnoses: Lumbar radiculopathy [M54.16]  Meds Administered: Meds ordered this encounter Medications  methylPREDNISolone  acetate  (DEPO-MEDROL ) injection 40 mg Laterality: Right Location/Site: L5 Needle:5.0 in., 22 ga.  Short bevel or Quincke spinal needle Needle Placement: Transforaminal Findings:   -Comments: Excellent flow of contrast along the nerve, nerve root and into the epidural space. Procedure Details: After squaring off the end-plates to get a true AP view, the C-arm was positioned so that an oblique view of the foramen as noted above was visualized. The target area is just inferior to the nose of the scotty dog or sub pedicular. The soft tissues overlying this structure were infiltrated with 2-3 ml. of 1% Lidocaine  without Epinephrine . The spinal needle was inserted toward the target using a trajectory view along the fluoroscope beam.  Under AP and lateral visualization, the needle was advanced so it did not puncture dura and was located close the 6 O'Clock position of the pedical in AP tracterory. Biplanar projections were used to confirm position. Aspiration was confirmed to be negative for CSF and/or blood. A 1-2 ml. volume of Isovue -250 was injected and flow of contrast was noted at each level. Radiographs were obtained for documentation purposes. After attaining the desired flow of contrast documented above, a 0.5 to 1.0 ml test dose of 0.25% Marcaine  was injected into each respective transforaminal space.  The patient was  observed for 90 seconds post injection.  After no sensory deficits were reported, and normal lower extremity motor function was noted,   the above injectate was administered so that equal amounts of the injectate were placed at each foramen (level) into the transforaminal epidural space. Additional Comments: The patient tolerated the procedure well Dressing: 2 x 2 sterile gauze and Band-Aid  Post-procedure details: Patient was observed during the procedure. Post-procedure instructions were reviewed. Patient left the clinic in stable condition.    Labs: CBC    Component Value Date/Time   WBC 7.0  11/02/2023 2125   RBC 4.25 11/02/2023 2125   HGB 11.6 (L) 11/02/2023 2125   HGB 13.7 08/01/2019 1439   HCT 43.1 11/02/2023 2125   HCT 39.6 08/01/2019 1439   PLT 100 (L) 11/02/2023 2125   PLT 210 08/01/2019 1439   MCV 101.4 (H) 11/02/2023 2125   MCV 94 08/01/2019 1439   MCH 27.3 11/02/2023 2125   MCHC 26.9 (L) 11/02/2023 2125   RDW 15.0 11/02/2023 2125   RDW 12.3 08/01/2019 1439   LYMPHSABS 1.4 10/08/2023 1431   LYMPHSABS 2.8 04/03/2015 1531   MONOABS 0.4 10/08/2023 1431   EOSABS 0.1 10/08/2023 1431   EOSABS 0.1 04/03/2015 1531   BASOSABS 0.0 10/08/2023 1431   BASOSABS 0.0 04/03/2015 1531    CMP     Component Value Date/Time   NA 138 11/02/2023 2125   NA 142 07/28/2018 1501   K 4.9 11/02/2023 2125   CL 104 11/02/2023 2125   CO2 19 (L) 11/02/2023 2125   GLUCOSE 109 (H) 11/02/2023 2125   BUN 13 11/02/2023 2125   BUN 14 07/28/2018 1501   CREATININE 0.72 11/02/2023 2125   CREATININE 0.80 05/24/2014 1608   CALCIUM  9.7 11/02/2023 2125   PROT 6.4 (L) 11/02/2023 2125   PROT 6.6 04/03/2015 1531   ALBUMIN 3.8 11/02/2023 2125   ALBUMIN 4.2 04/03/2015 1531   AST 153 (H) 11/02/2023 2125   ALT 49 (H) 11/02/2023 2125   ALKPHOS 94 11/02/2023 2125   BILITOT 0.5 11/02/2023 2125   BILITOT 0.9 04/03/2015 1531   GFRNONAA >60 11/02/2023 2125   GFRNONAA 78 05/24/2014 1608   GFRAA >60 03/22/2019 1741   GFRAA 89 05/24/2014 1608       Assessment and Plan:   Emme Rosenau is a 73 y.o. y/o female returns for follow-up of chronic GI symptoms:  1.  Rectal bleeding attributed to internal hemorrhoids.  She tried hydrocortisone  suppositories with little benefit. - Schedule internal hemorrhoid banding.  2.  Chronic diarrhea, attributed to irritable bowel syndrome and bile salt diarrhea postcholecystectomy.  Also possible adverse side effect of metformin . - Abdominal x-ray, evaluate stool burden.  Evaluate for overflow diarrhea from constipation. - Refill dicyclomine  20mg  1 tablet  3 times daily as needed for cramping and diarrhea.  Ellouise Console, PA-C  Follow up 3 months with Dr. Abran.

## 2023-12-08 NOTE — Progress Notes (Signed)
 Noted

## 2023-12-08 NOTE — Telephone Encounter (Signed)
 Pharmacy Patient Advocate Encounter   Received notification from CoverMyMeds that prior authorization for Dicyclomine  HCl 20MG  tablets is required/requested.   Insurance verification completed.   The patient is insured through [employer] [facility] [employer].   Per test claim: PA required; PA submitted to above mentioned insurance via Latent Key/confirmation #/EOC [id number] Status is pending

## 2023-12-08 NOTE — Patient Instructions (Addendum)
 _______________________________________________________  If your blood pressure at your visit was 140/90 or greater, please contact your primary care physician to follow up on this.  _______________________________________________________  If you are age 75 or older, your body mass index should be between 23-30. Your Body mass index is 26.55 kg/m. If this is out of the aforementioned range listed, please consider follow up with your Primary Care Provider.  If you are age 75 or younger, your body mass index should be between 19-25. Your Body mass index is 26.55 kg/m. If this is out of the aformentioned range listed, please consider follow up with your Primary Care Provider.   ________________________________________________________  The [facility] providers would like to encourage you to use MYCHART to communicate with providers for non-urgent requests or questions.  Due to long hold times on the telephone, sending your provider a message by [facility] may be a faster and more efficient way to get a response.  Please allow 48 business hours for a response.  Please remember that this is for non-urgent requests.  _______________________________________________________  [facility] is using a team-based approach to care.  Your team is made up of your doctor and two to three APPS. Our APPS (Nurse Practitioners and Physician Assistants) work with your physician to ensure care continuity for you. They are fully qualified to address your health concerns and develop a treatment plan. They communicate directly with your gastroenterologist to care for you. Seeing the Advanced Practice Practitioners on your physician's team can help you by facilitating care more promptly, often allowing for earlier appointments, access to diagnostic testing, procedures, and other specialty referrals.    Your provider has requested that you have an abdominal x ray before leaving today. Please go to the basement floor  to our Radiology department for the test.  Hemorrhoid banding with Dr Albertus on 01-25-24 at 4pm  Follow up in 3 months with Dr Abran.  We have sent the following medications to your pharmacy for you to pick up at your convenience: Bentyl  3 times a day  It was a pleasure to see you today!  Thank you for trusting me with your gastrointestinal care!

## 2023-12-08 NOTE — Telephone Encounter (Signed)
 Pharmacy Patient Advocate Encounter  Received notification from [facility] [employer] that Prior Authorization for Dicyclomine  HCl 20MG  tablets has been APPROVED from 03-10-2023 to 03-08-2024   PA #/Case ID/Reference #: [id number]

## 2023-12-09 ENCOUNTER — Ambulatory Visit: Payer: Self-pay | Admitting: Physician Assistant

## 2023-12-09 LAB — BASIC METABOLIC PANEL WITH GFR
BUN: 13 mg/dL (ref 6–23)
CO2: 25 meq/L (ref 19–32)
Calcium: 9.9 mg/dL (ref 8.4–10.5)
Chloride: 102 meq/L (ref 96–112)
Creatinine, Ser: 0.68 mg/dL (ref 0.40–1.20)
GFR: 85.21 mL/min (ref >60.00)
Glucose, Bld: 126 mg/dL — ABNORMAL HIGH (ref 70–99)
Potassium: 3.8 meq/L (ref 3.5–5.1)
Sodium: 138 meq/L (ref 135–145)

## 2023-12-09 LAB — CBC WITH DIFFERENTIAL/PLATELET
Basophils Absolute: 0 K/uL (ref 0.0–0.1)
Basophils Relative: 0.5 % (ref 0.0–3.0)
Eosinophils Absolute: 0 K/uL (ref 0.0–0.7)
Eosinophils Relative: 0 % (ref 0.0–5.0)
HCT: 37.7 % (ref 36.0–46.0)
Hemoglobin: 12.2 g/dL (ref 12.0–15.0)
Lymphocytes Relative: 9.7 % — ABNORMAL LOW (ref 12.0–46.0)
Lymphs Abs: 0.7 K/uL (ref 0.7–4.0)
MCHC: 32.5 g/dL (ref 30.0–36.0)
MCV: 89.8 fl (ref 78.0–100.0)
Monocytes Absolute: 0.3 K/uL (ref 0.1–1.0)
Monocytes Relative: 4.1 % (ref 3.0–12.0)
Neutro Abs: 6.4 K/uL (ref 1.4–7.7)
Neutrophils Relative %: 85.7 % — ABNORMAL HIGH (ref 43.0–77.0)
Platelets: 184 K/uL (ref 150.0–400.0)
RBC: 4.19 Mil/uL (ref 3.87–5.11)
RDW: 17.9 % — ABNORMAL HIGH (ref 11.5–15.5)
WBC: 7.5 K/uL (ref 4.0–10.5)

## 2023-12-09 LAB — IBC + FERRITIN
Ferritin: 43.4 ng/mL (ref 10.0–291.0)
Iron: 37 ug/dL — ABNORMAL LOW (ref 42–145)
Saturation Ratios: 9.2 % — ABNORMAL LOW (ref 20.0–50.0)
TIBC: 403.2 ug/dL (ref 250.0–450.0)
Transferrin: 288 mg/dL (ref 212.0–360.0)

## 2023-12-09 NOTE — Progress Notes (Signed)
 Note, patient has dementia. Call and notify patient and her husband labs show:  Normal BMP.  Glucose, kidney test, and electrolytes are normal.   Hemoglobin has improved to 12.2, and is normal. White count is normal.  No evidence of infection. Iron is a little low.  Ask if patient is taking ferrous sulfate  iron tablet 325 mg 1 tablet once daily?  If not, then she should take it every day with vitamin C.  If she has been taking it every day, then we may need to refer for IV iron or increase iron tablet to twice daily. Ellouise Console, PA-C

## 2023-12-10 ENCOUNTER — Ambulatory Visit

## 2023-12-13 ENCOUNTER — Encounter (HOSPITAL_COMMUNITY): Payer: Self-pay

## 2023-12-13 ENCOUNTER — Emergency Department (HOSPITAL_COMMUNITY)
Admission: EM | Admit: 2023-12-13 | Discharge: 2023-12-13 | Disposition: A | Discharge status: Home / Self Care | Attending: Emergency Medicine | Admitting: Emergency Medicine

## 2023-12-13 ENCOUNTER — Other Ambulatory Visit: Payer: Self-pay

## 2023-12-13 DIAGNOSIS — Z7901 Long term (current) use of anticoagulants: Secondary | ICD-10-CM | POA: Insufficient documentation

## 2023-12-13 DIAGNOSIS — R040 Epistaxis: Secondary | ICD-10-CM | POA: Diagnosis present

## 2023-12-13 DIAGNOSIS — Z91040 Latex allergy status: Secondary | ICD-10-CM | POA: Diagnosis not present

## 2023-12-13 LAB — CBC
HCT: 37.2 % (ref 36.0–46.0)
Hemoglobin: 11.8 g/dL — ABNORMAL LOW (ref 12.0–15.0)
MCH: 28.9 pg (ref 26.0–34.0)
MCHC: 31.7 g/dL (ref 30.0–36.0)
MCV: 91.2 fL (ref 80.0–100.0)
Platelets: 269 K/uL (ref 150–400)
RBC: 4.08 MIL/uL (ref 3.87–5.11)
RDW: 15.2 % (ref 11.5–15.5)
WBC: 6.7 K/uL (ref 4.0–10.5)
nRBC: 0 % (ref 0.0–0.2)

## 2023-12-13 LAB — PROTIME-INR
INR: >10 (ref 0.8–1.2)
Prothrombin Time: 84.2 s — ABNORMAL HIGH (ref 11.4–15.2)

## 2023-12-13 MED ORDER — SALINE SPRAY 0.65 % NA SOLN: 2.0000 | NASAL | 0 refills | Status: AC | PRN

## 2023-12-13 MED ORDER — SILVER NITRATE-POT NITRATE 75-25 % EX MISC
1.0000 | Freq: Once | CUTANEOUS | Status: AC
  Administered 2023-12-13: 1 via TOPICAL
  Filled 2023-12-13: qty 1

## 2023-12-13 MED ORDER — OXYMETAZOLINE HCL 0.05 % NA SOLN: 1.0000 | Freq: Once | NASAL | Status: DC

## 2023-12-13 NOTE — ED Provider Notes (Addendum)
 [name] EMERGENCY DEPARTMENT AT [facility] Provider Note   CSN: [id number] Arrival date & time: 12/13/23  1518     Patient presents with: Epistaxis   [name] is a 73 y.o. female who presents to the ED with primary concern of right-sided nosebleed.  Was seen at primary care and referred to ED secondary to epistaxis send and patient is on warfarin for atrial fibrillation.  Nasal tampon placed prior to arrival, sudden onset of nosebleed that began this afternoon.    Epistaxis      Prior to Admission medications   Medication Sig Start Date End Date Taking? Authorizing Provider  sodium chloride  (OCEAN) 0.65 % SOLN nasal spray Place 2 sprays into both nostrils as needed for congestion. 12/13/23  Yes Myriam Dorn BROCKS, PA  ACCU-CHEK GUIDE test strip  11/06/20   [provider]  acetaminophen  (TYLENOL ) 500 MG tablet Take 500-1,000 mg by mouth every 6 (six) hours as needed for mild pain or headache.    [provider]  Alcohol Swabs (ALCOHOL WIPES) 70 % PADS Use 1 swab as needed to clean site for blood sugar check. 11/06/20   [provider]  amphetamine-dextroamphetamine (ADDERALL) 10 MG tablet Take 10 mg by mouth. 12/01/23   [provider]  atenolol  (TENORMIN ) 25 MG tablet Take 1.5 tablets (37.5 mg total) by mouth daily. 09/18/19   Lee, Joshua K, MD  Blood Glucose Calibration [facility] CONTROL) SOLN  11/06/20   [provider]  Blood Glucose Monitoring Suppl (ACCU-CHEK GUIDE ME) w/Device KIT daily. 11/06/20   [provider]  Blood Glucose Monitoring Suppl (GLUCOCOM BLOOD GLUCOSE MONITOR) DEVI  11/06/20   [provider]  buPROPion (WELLBUTRIN XL) 150 MG 24 hr tablet Take 150 mg by mouth daily.    [provider]  Cholecalciferol  (VITAMIN D3) 50 MCG (2000 UT) TABS Take 2,000 Int'l Units by mouth daily.    [provider]  colestipol  (COLESTID ) 1 g tablet Take 2 tablets (2 g total) by  mouth 2 (two) times daily. 09/29/23   [place], PA-C  Cyanocobalamin  (VITAMIN B-12) 5000 MCG TBDP Take 5,000 mcg by mouth daily.    [provider]  diazepam  (VALIUM ) 2 MG tablet Take 1 tablet (2 mg total) by mouth every 8 (eight) hours as needed for muscle spasms. 06/09/23   Silver Wonda LABOR, PA  dicyclomine  (BENTYL ) 20 MG tablet Take 1 tablet (20 mg total) by mouth 3 (three) times daily before meals. 12/08/23 03/07/24  [place], PA-C  DILT-XR 180 MG 24 hr capsule Take 180 mg by mouth daily. 09/22/23   [provider]  diltiazem  (CARDIZEM  CD) 180 MG 24 hr capsule Take 1 capsule (180 mg total) by mouth daily. 03/13/19   Kerman Soulier, MD  donepezil (ARICEPT) 10 MG tablet Take 10 mg by mouth daily.    [provider]  DULoxetine  (CYMBALTA ) 60 MG capsule Take 1 capsule (60 mg total) by mouth daily. 03/13/19   Helberg, Soulier, MD  escitalopram (LEXAPRO) 20 MG tablet Take 20 mg by mouth daily. 12/01/23   [provider]  ferrous sulfate  325 (65 FE) MG tablet TAKE 1 TABLET BY MOUTH EVERY DAY WITH BREAKFAST 11/05/23   [place], PA-C  glucose blood (PRECISION QID TEST) test strip  11/06/20   [provider]  Lancet Devices (SIMPLE DIAGNOSTICS LANCING DEV) MISC  11/06/20   [provider]  levothyroxine  (SYNTHROID ) 50 MCG tablet Take 1 tablet (50 mcg total) by mouth  every morning. 03/13/19   Kerman Soulier, MD  lidocaine  (LIDODERM ) 5 % Place 1 patch onto the skin daily. Remove & Discard patch within 12 hours or as directed by MD 06/09/23   Silver Wonda LABOR, PA  loperamide  (IMODIUM ) 2 MG capsule Take 2 capsules (4 mg total) by mouth 3 (three) times daily. 10/08/23 04/05/24  [place], PA-C  LORazepam  (ATIVAN ) 0.5 MG tablet Take 1.5 mg by mouth. 12/01/23   [provider]  losartan  (COZAAR ) 25 MG tablet Take 1 tablet (25 mg total) by mouth daily. 03/13/19   Helberg, Soulier, MD  metFORMIN  (GLUCOPHAGE -XR) 500 MG 24 hr tablet TAKE 2 TABLETS(1000 MG)  BY MOUTH TWICE DAILY WITH FOOD Patient taking differently: Take 500 mg by mouth daily with breakfast. TAKE 1 TABLETS(1000 MG) BY MOUTH TWICE DAILY WITH FOOD starting 10/08/2023 12/12/19   Aslam, Sadia, MD  omeprazole  (PRILOSEC) 40 MG capsule Take 40 mg by mouth daily.    [provider]  ondansetron  (ZOFRAN -ODT) 4 MG disintegrating tablet Take 4 mg by mouth every 8 (eight) hours as needed for vomiting or nausea. 08/02/22   [provider]  potassium chloride  SA (KLOR-CON  M) 20 MEQ tablet Take 1 tablet (20 mEq total) by mouth once for 1 dose. 10/13/23 12/08/23  [place], PA-C  predniSONE  (STERAPRED UNI-PAK 21 TAB) 5 MG (21) TBPK tablet SMARTSIG:- Tablet(s) By Mouth - 12/07/23   [provider]  rosuvastatin  (CRESTOR ) 20 MG tablet Take 1 tablet (20 mg total) by mouth daily. 03/13/19   Kerman Soulier, MD  traMADol  (ULTRAM ) 50 MG tablet Take 50 mg by mouth every 6 (six) hours as needed for moderate pain. 12/04/22   [provider]  warfarin (COUMADIN ) 2.5 MG tablet TAKE 1 AND 1/2 TABLETS BY MOUTH DAILY AS DIRECTED BY COUMADIN  CLINIC 11/18/23   Lavona Agent, MD    Allergies: Gemfibrozil, Latex, Penicillins, Adhesive [tape], Codeine, Digoxin  and related, Metformin  and related, Omeprazole , and Ace inhibitors    Review of Systems  HENT:  Positive for nosebleeds.   All other systems reviewed and are negative.   Updated Vital Signs BP 138/85   Pulse 80   Temp 97.6 F (36.4 C)   Resp 14   Ht 5' 1 (1.549 m)   Wt 63.5 kg   LMP 06/03/1978   SpO2 98%   BMI 26.45 kg/m   Physical Exam Vitals and nursing note reviewed.  Constitutional:      General: She is not in acute distress.    Appearance: Normal appearance.  HENT:     Head: Normocephalic and atraumatic.     Nose:     Right Nostril: Epistaxis present.     Comments: No frank epistaxis appreciated however there is an area of erythema and superficial vessels noted to the right nare at the septum, anterior and  superficial.    Mouth/Throat:     Mouth: Mucous membranes are moist.     Pharynx: Oropharynx is clear.  Eyes:     Extraocular Movements: Extraocular movements intact.     Conjunctiva/sclera: Conjunctivae normal.     Pupils: Pupils are equal, round, and reactive to light.  Cardiovascular:     Rate and Rhythm: Normal rate and regular rhythm.     Pulses: Normal pulses.     Heart sounds: Normal heart sounds. No murmur heard.    No friction rub. No gallop.  Pulmonary:     Effort: Pulmonary effort is normal.     Breath sounds: Normal breath sounds.  Abdominal:     General: Abdomen is flat. Bowel sounds are normal.     Palpations: Abdomen is soft.  Musculoskeletal:        General: Normal range of motion.     Cervical back: Normal range of motion and neck supple.     Right lower leg: No edema.     Left lower leg: No edema.  Skin:    General: Skin is warm and dry.     Capillary Refill: Capillary refill takes less than 2 seconds.  Neurological:     General: No focal deficit present.     Mental Status: She is alert. Mental status is at baseline.  Psychiatric:        Mood and Affect: Mood normal.     (all labs ordered are listed, but only abnormal results are displayed) Labs Reviewed  CBC  PROTIME-INR    EKG: None  Radiology: No results found.   Cauterization  Date/Time: 12/13/2023 4:50 PM  Performed by: Myriam Dorn BROCKS, PA Authorized by: Myriam Dorn BROCKS, PA  Consent: Verbal consent obtained Risks and benefits: risks, benefits and alternatives were discussed Consent given by: patient Patient understanding: patient states understanding of the procedure being performed Patient identity confirmed: verbally with patient Time out: Immediately prior to procedure a time out was called to verify the correct patient, procedure, equipment, support staff and site/side marked as required. Local anesthesia used: no  Anesthesia: Local anesthesia used:  no  Sedation: Patient sedated: no  Patient tolerance: patient tolerated the procedure well with no immediate complications Comments: Silver nitrate cautery performed, tolerated well without complication, no postprocedural epistaxis appreciated.      Medications Ordered in the ED  silver nitrate applicators applicator 1 Application (has no administration in time range)                                    Medical Decision Making Amount and/or Complexity of Data Reviewed Labs: ordered. Radiology: ordered.  Risk OTC drugs. Prescription drug management.   Patient assessment this patient, she has superficial vessels that are prominent on the septum in the right naris superficially.  As these are easy to access, silver nitrate was used to cauterize as noted in the procedure note.  This was tolerated well, and there was no postprocedural post axis appreciated.  Blood pressure is within normal limits, no reports of headache, chest pain, visual changes.  As findings are reassuring, plan at this time is to discharge with outpatient therapy of moisturizing gels applied to the nare, follow-up to primary care as needed for continued follow-up.  Discussed with patient, this is understood, she agrees and has no further concerns at this time.  Evaluated her labs, has a INR of 10.  This is a significant increase for this patient.  As such, have informed patient to hold her Coumadin  until rechecked and informed to restart by her PCP.  Spoke directly over the phone with her husband, relayed this message, they understand and agree, endorse that she has a Coumadin  follow-up on this coming Friday, 10 October.     Final diagnoses:  Epistaxis    ED Discharge Orders          Ordered    sodium chloride  (OCEAN) 0.65 % SOLN nasal spray  As needed        12/13/23 1647  Myriam Dorn BROCKS, PA 12/13/23 1652    Myriam Dorn BROCKS, PA 12/13/23 ZEB    Bernard Drivers,  MD 12/13/23 1949

## 2023-12-13 NOTE — ED Triage Notes (Signed)
 Pt reports bleeding from right nare onset today around 1330. She presents with a nasal tampon inside her right nare. She reports she fell down 4 days ago. She hit her head and has bruise to right shoulder. She is A&Ox4 ambulatory with independent steady gait.

## 2023-12-13 NOTE — Discharge Instructions (Addendum)
 Obtain Ayr gel and apply as needed to prevent irritation and keep the nasal tissue moist.

## 2023-12-14 ENCOUNTER — Emergency Department (HOSPITAL_BASED_OUTPATIENT_CLINIC_OR_DEPARTMENT_OTHER)
Admission: EM | Admit: 2023-12-14 | Discharge: 2023-12-14 | Disposition: A | Discharge status: Home / Self Care | Attending: Emergency Medicine | Admitting: Emergency Medicine

## 2023-12-14 ENCOUNTER — Encounter (HOSPITAL_BASED_OUTPATIENT_CLINIC_OR_DEPARTMENT_OTHER): Payer: Self-pay | Admitting: Emergency Medicine

## 2023-12-14 DIAGNOSIS — I4891 Unspecified atrial fibrillation: Secondary | ICD-10-CM | POA: Insufficient documentation

## 2023-12-14 DIAGNOSIS — R791 Abnormal coagulation profile: Secondary | ICD-10-CM | POA: Diagnosis not present

## 2023-12-14 DIAGNOSIS — Z7901 Long term (current) use of anticoagulants: Secondary | ICD-10-CM | POA: Insufficient documentation

## 2023-12-14 DIAGNOSIS — Z91040 Latex allergy status: Secondary | ICD-10-CM | POA: Diagnosis not present

## 2023-12-14 DIAGNOSIS — R040 Epistaxis: Secondary | ICD-10-CM | POA: Insufficient documentation

## 2023-12-14 LAB — PROTIME-INR
INR: 8.4 (ref 0.8–1.2)
Prothrombin Time: 72.6 s — ABNORMAL HIGH (ref 11.4–15.2)

## 2023-12-14 LAB — BASIC METABOLIC PANEL WITH GFR
Anion gap: 13 (ref 5–15)
BUN: 11 mg/dL (ref 8–23)
CO2: 25 mmol/L (ref 22–32)
Calcium: 10 mg/dL (ref 8.9–10.3)
Chloride: 101 mmol/L (ref 98–111)
Creatinine, Ser: 0.68 mg/dL (ref 0.44–1.00)
GFR, Estimated: >60 mL/min (ref >60)
Glucose, Bld: 127 mg/dL — ABNORMAL HIGH (ref 70–99)
Potassium: 3.4 mmol/L — ABNORMAL LOW (ref 3.5–5.1)
Sodium: 138 mmol/L (ref 135–145)

## 2023-12-14 LAB — CBC WITH DIFFERENTIAL/PLATELET
Abs Immature Granulocytes: 0.02 K/uL (ref 0.00–0.07)
Basophils Absolute: 0 K/uL (ref 0.0–0.1)
Basophils Relative: 0 %
Eosinophils Absolute: 0.1 K/uL (ref 0.0–0.5)
Eosinophils Relative: 2 %
HCT: 36.1 % (ref 36.0–46.0)
Hemoglobin: 11.9 g/dL — ABNORMAL LOW (ref 12.0–15.0)
Immature Granulocytes: 0 %
Lymphocytes Relative: 18 %
Lymphs Abs: 1.3 K/uL (ref 0.7–4.0)
MCH: 29.5 pg (ref 26.0–34.0)
MCHC: 33 g/dL (ref 30.0–36.0)
MCV: 89.4 fL (ref 80.0–100.0)
Monocytes Absolute: 0.5 K/uL (ref 0.1–1.0)
Monocytes Relative: 7 %
Neutro Abs: 5.3 K/uL (ref 1.7–7.7)
Neutrophils Relative %: 73 %
Platelets: 233 K/uL (ref 150–400)
RBC: 4.04 MIL/uL (ref 3.87–5.11)
RDW: 15.2 % (ref 11.5–15.5)
WBC: 7.2 K/uL (ref 4.0–10.5)
nRBC: 0 % (ref 0.0–0.2)

## 2023-12-14 MED ORDER — VITAMIN K1 10 MG/ML IJ SOLN
INTRAMUSCULAR | Status: AC
  Filled 2023-12-14: qty 1

## 2023-12-14 MED ORDER — ACETAMINOPHEN 500 MG PO TABS
1000.0000 mg | ORAL_TABLET | Freq: Once | ORAL | Status: AC
  Administered 2023-12-14: 1000 mg via ORAL
  Filled 2023-12-14: qty 2

## 2023-12-14 MED ORDER — VITAMIN K1 10 MG/ML IJ SOLN
2.0000 mg | Freq: Once | INTRAVENOUS | Status: AC
  Administered 2023-12-14: 2 mg via INTRAVENOUS
  Filled 2023-12-14: qty 0.2

## 2023-12-14 MED ORDER — OXYMETAZOLINE HCL 0.05 % NA SOLN
1.0000 | Freq: Once | NASAL | Status: AC
Start: 2023-12-14 — End: 2023-12-14
  Administered 2023-12-14: 1 via NASAL
  Filled 2023-12-14: qty 30

## 2023-12-14 NOTE — ED Triage Notes (Signed)
 Pt c/o RT nares epistaxis today, tx for same yesterday. Pt takes coumadin , last dose x 2 days pta. Minimal bleeding noted in triage

## 2023-12-14 NOTE — ED Notes (Signed)
 Afrin and nose clamp applied at 1215

## 2023-12-14 NOTE — ED Provider Notes (Signed)
  EMERGENCY DEPARTMENT AT [facility] Provider Note   CSN: [id number] Arrival date & time: 12/14/23  1044     Patient presents with: Epistaxis   [name] is a 73 y.o. female.   Patient here with right-sided nosebleed on and off for the last 2 days.  [name] is on Coumadin  for A-fib.  [name] is held this last few days.  INR was greater than 10 yesterday.  Ongoing small amount of bleeding today.  [name] had her nose cauterized yesterday.  But still having some blood coming from the right nare.  Denies any weakness numbness tingling.  The history is provided by the patient.       Prior to Admission medications   Medication Sig Start Date End Date Taking? Authorizing Provider  ACCU-CHEK GUIDE test strip  11/06/20   [provider]  acetaminophen  (TYLENOL ) 500 MG tablet Take 500-1,000 mg by mouth every 6 (six) hours as needed for mild pain or headache.    [provider]  Alcohol Swabs (ALCOHOL WIPES) 70 % PADS Use 1 swab as needed to clean site for blood sugar check. 11/06/20   [provider]  amphetamine-dextroamphetamine (ADDERALL) 10 MG tablet Take 10 mg by mouth. 12/01/23   [provider]  atenolol  (TENORMIN ) 25 MG tablet Take 1.5 tablets (37.5 mg total) by mouth daily. 09/18/19   Lee, Joshua K, MD  Blood Glucose Calibration [facility] CONTROL) SOLN  11/06/20   [provider]  Blood Glucose Monitoring Suppl (ACCU-CHEK GUIDE ME) w/Device KIT daily. 11/06/20   [provider]  Blood Glucose Monitoring Suppl (GLUCOCOM BLOOD GLUCOSE MONITOR) DEVI  11/06/20   [provider]  buPROPion (WELLBUTRIN XL) 150 MG 24 hr tablet Take 150 mg by mouth daily.    [provider]  Cholecalciferol  (VITAMIN D3) 50 MCG (2000 UT) TABS Take 2,000 Int'l Units by mouth daily.    [provider]  colestipol  (COLESTID ) 1 g tablet Take 2 tablets (2 g total) by mouth 2 (two) times daily. 09/29/23   [place],  PA-C  Cyanocobalamin  (VITAMIN B-12) 5000 MCG TBDP Take 5,000 mcg by mouth daily.    [provider]  diazepam  (VALIUM ) 2 MG tablet Take 1 tablet (2 mg total) by mouth every 8 (eight) hours as needed for muscle spasms. 06/09/23   Silver Wonda LABOR, PA  dicyclomine  (BENTYL ) 20 MG tablet Take 1 tablet (20 mg total) by mouth 3 (three) times daily before meals. 12/08/23 03/07/24  [place], PA-C  DILT-XR 180 MG 24 hr capsule Take 180 mg by mouth daily. 09/22/23   [provider]  diltiazem  (CARDIZEM  CD) 180 MG 24 hr capsule Take 1 capsule (180 mg total) by mouth daily. 03/13/19   Kerman Soulier, MD  donepezil (ARICEPT) 10 MG tablet Take 10 mg by mouth daily.    [provider]  DULoxetine  (CYMBALTA ) 60 MG capsule Take 1 capsule (60 mg total) by mouth daily. 03/13/19   Helberg, Soulier, MD  escitalopram (LEXAPRO) 20 MG tablet Take 20 mg by mouth daily. 12/01/23   [provider]  ferrous sulfate  325 (65 FE) MG tablet TAKE 1 TABLET BY MOUTH EVERY DAY WITH BREAKFAST 11/05/23   [place], PA-C  glucose blood (PRECISION QID TEST) test strip  11/06/20   [provider]  Lancet Devices (SIMPLE DIAGNOSTICS LANCING DEV) MISC  11/06/20   [provider]  levothyroxine  (SYNTHROID ) 50 MCG tablet Take 1 tablet (50 mcg total) by mouth every morning.  03/13/19   Kerman Soulier, MD  lidocaine  (LIDODERM ) 5 % Place 1 patch onto the skin daily. Remove & Discard patch within 12 hours or as directed by MD 06/09/23   Silver Wonda LABOR, PA  loperamide  (IMODIUM ) 2 MG capsule Take 2 capsules (4 mg total) by mouth 3 (three) times daily. 10/08/23 04/05/24  [place], PA-C  LORazepam  (ATIVAN ) 0.5 MG tablet Take 1.5 mg by mouth. 12/01/23   [provider]  losartan  (COZAAR ) 25 MG tablet Take 1 tablet (25 mg total) by mouth daily. 03/13/19   Kerman Soulier, MD  metFORMIN  (GLUCOPHAGE -XR) 500 MG 24 hr tablet TAKE 2 TABLETS(1000 MG) BY MOUTH TWICE DAILY WITH FOOD Patient taking  differently: Take 500 mg by mouth daily with breakfast. TAKE 1 TABLETS(1000 MG) BY MOUTH TWICE DAILY WITH FOOD starting 10/08/2023 12/12/19   Aslam, Sadia, MD  omeprazole  (PRILOSEC) 40 MG capsule Take 40 mg by mouth daily.    [provider]  ondansetron  (ZOFRAN -ODT) 4 MG disintegrating tablet Take 4 mg by mouth every 8 (eight) hours as needed for vomiting or nausea. 08/02/22   [provider]  potassium chloride  SA (KLOR-CON  M) 20 MEQ tablet Take 1 tablet (20 mEq total) by mouth once for 1 dose. 10/13/23 12/08/23  [place], PA-C  predniSONE  (STERAPRED UNI-PAK 21 TAB) 5 MG (21) TBPK tablet SMARTSIG:- Tablet(s) By Mouth - 12/07/23   [provider]  rosuvastatin  (CRESTOR ) 20 MG tablet Take 1 tablet (20 mg total) by mouth daily. 03/13/19   Kerman Soulier, MD  sodium chloride  (OCEAN) 0.65 % SOLN nasal spray Place 2 sprays into both nostrils as needed for congestion. 12/13/23   Myriam Dorn BROCKS, PA  traMADol  (ULTRAM ) 50 MG tablet Take 50 mg by mouth every 6 (six) hours as needed for moderate pain. 12/04/22   [provider]  warfarin (COUMADIN ) 2.5 MG tablet TAKE 1 AND 1/2 TABLETS BY MOUTH DAILY AS DIRECTED BY COUMADIN  CLINIC 11/18/23   Lavona Agent, MD    Allergies: Gemfibrozil, Latex, Penicillins, Adhesive [tape], Codeine, Digoxin  and related, Metformin  and related, Omeprazole , and Ace inhibitors    Review of Systems  Updated Vital Signs BP (!) 140/84   Pulse 85   Temp 98.7 F (37.1 C)   Resp 18   Wt 61.2 kg   LMP 06/03/1978   SpO2 100%   BMI 25.51 kg/m   Physical Exam Vitals and nursing note reviewed.  Constitutional:      General: [name] is not in acute distress.    Appearance: [name] is well-developed. [name] is not ill-appearing.  HENT:     Head: Normocephalic and atraumatic.     Nose:     Comments: Blood still come from the right nare is anterior Eyes:     Conjunctiva/sclera: Conjunctivae normal.  Cardiovascular:     Rate and Rhythm: Normal rate and  regular rhythm.     Pulses: Normal pulses.     Heart sounds: No murmur heard. Pulmonary:     Effort: Pulmonary effort is normal. No respiratory distress.     Breath sounds: Normal breath sounds.  Abdominal:     Palpations: Abdomen is soft.     Tenderness: There is no abdominal tenderness.  Musculoskeletal:        General: No swelling.     Cervical back: Normal range of motion and neck supple.  Skin:    General: Skin is warm and dry.     Capillary Refill: Capillary refill takes less than 2 seconds.  Neurological:     Mental Status: [name] is alert.  Psychiatric:        Mood and Affect: Mood normal.     (all labs ordered are listed, but only abnormal results are displayed) Labs Reviewed  CBC WITH DIFFERENTIAL/PLATELET - Abnormal; Notable for the following components:      Result Value   Hemoglobin 11.9 (*)    All other components within normal limits  BASIC METABOLIC PANEL WITH GFR - Abnormal; Notable for the following components:   Potassium 3.4 (*)    Glucose, Bld 127 (*)    All other components within normal limits  PROTIME-INR - Abnormal; Notable for the following components:   Prothrombin Time 72.6 (*)    INR 8.4 (*)    All other components within normal limits    EKG: None  Radiology: No results found.   Epistaxis Management  Date/Time: 12/14/2023 12:49 PM  Performed by: Ruthe Cornet, DO Authorized by: Ruthe Cornet, DO   Consent:    Consent obtained:  Verbal   Consent given by:  Patient   Risks, benefits, and alternatives were discussed: yes     Risks discussed:  Bleeding, infection, nasal injury and pain   Alternatives discussed:  No treatment Universal protocol:    Procedure explained and questions answered to patient or proxy's satisfaction: yes     Relevant documents present and verified: yes     Test results available: yes     Patient identity confirmed:  Verbally with patient Anesthesia:    Anesthesia method:  None Procedure details:     Treatment site:  R anterior   Treatment method:  Anterior pack   Treatment complexity:  Limited   Treatment episode: recurring   Post-procedure details:    Assessment:  Bleeding stopped   Procedure completion:  Tolerated    Medications Ordered in the ED  phytonadione (VITAMIN K) 2 mg in dextrose  5 % 50 mL IVPB (has no administration in time range)  oxymetazoline (AFRIN) 0.05 % nasal spray 1 spray (1 spray Each Nare Given 12/14/23 1215)                                    Medical Decision Making Amount and/or Complexity of Data Reviewed Labs: ordered.  Risk OTC drugs.   Shirlette Mianna Iezzi is here with ongoing nosebleed from her right nare.  Looks like a right anterior nosebleed.  Still having small amount of bleeding from the nose has not gotten stopped with Afrin and pressure.  Her INR level was greater than 10 yesterday.  [name] is on Coumadin  for A-fib.  Today is 8.4.  Hemodynamics are otherwise normal.  Blood works otherwise unremarkable.  I talked with pharmacy and will give her a dose of IV vitamin K to help continue the reversal of her INR.  [name] can continue to hold her Coumadin  and follow-up in clinic later this week.  [name] will follow-up with ENT after I placed the nasal tampon that stopped the bleeding.  [name] had cauterization done yesterday.  Ultimately will follow-up with ENT and Coumadin  clinic.  Understand return precautions.  Discharge.  This chart was dictated using voice recognition software.  Despite best efforts to proofread,  errors can occur which can change the documentation meaning.      Final diagnoses:  Nosebleed  Elevated INR    ED Discharge Orders     None  Ruthe Cornet, DO 12/14/23 1251

## 2023-12-14 NOTE — Discharge Instructions (Addendum)
 Please have your INR rechecked at Coumadin  clinic in the next 1 to 2 days.  Continue to hold your Coumadin  until then.  Keep nasal tampon in place and follow-up with ENT.  Should try to have this removed by Friday.  I recommend that you go to [facility] if any rebleeding occurs.

## 2023-12-14 NOTE — ED Notes (Signed)

## 2023-12-17 ENCOUNTER — Ambulatory Visit: Attending: Cardiology | Admitting: *Deleted

## 2023-12-17 DIAGNOSIS — Z5181 Encounter for therapeutic drug level monitoring: Secondary | ICD-10-CM

## 2023-12-17 DIAGNOSIS — I4891 Unspecified atrial fibrillation: Secondary | ICD-10-CM

## 2023-12-17 LAB — POCT INR: INR: 1.3 — AB (ref 2.0–3.0)

## 2023-12-17 NOTE — Progress Notes (Signed)
 Description   INR-1.3; Today and tomorrow take 2 tablets of warfarin then continue taking warfarin 1.5 tablets daily. Recheck INR in 1 week-post vitamin k and er visit. Stay consistent with greens each week (1 per week).  Anticoagulation Clinic [phone]

## 2023-12-17 NOTE — Patient Instructions (Addendum)
 Description   INR-1.3; Today and tomorrow take 2 tablets of warfarin then continue taking warfarin 1.5 tablets daily. Recheck INR in 1 week-post vitamin k and er visit. Stay consistent with greens each week (1 per week).  Anticoagulation Clinic [phone]

## 2023-12-22 ENCOUNTER — Ambulatory Visit: Attending: Cardiology | Admitting: *Deleted

## 2023-12-22 DIAGNOSIS — Z5181 Encounter for therapeutic drug level monitoring: Secondary | ICD-10-CM | POA: Diagnosis not present

## 2023-12-22 DIAGNOSIS — I4891 Unspecified atrial fibrillation: Secondary | ICD-10-CM | POA: Diagnosis not present

## 2023-12-22 LAB — POCT INR: INR: 3.5 — AB (ref 2.0–3.0)

## 2023-12-22 NOTE — Progress Notes (Signed)
 Description   INR-3.5; Do not take any warfarin today then continue taking warfarin 1.5 tablets daily. Recheck INR in 1 week. Stay consistent with greens each week (1 per week).  Anticoagulation Clinic [phone]

## 2023-12-22 NOTE — Patient Instructions (Signed)
 Description   INR-3.5; Do not take any warfarin today then continue taking warfarin 1.5 tablets daily. Recheck INR in 1 week. Stay consistent with greens each week (1 per week).  Anticoagulation Clinic [phone]

## 2023-12-30 ENCOUNTER — Ambulatory Visit: Attending: Cardiology

## 2023-12-30 DIAGNOSIS — Z5181 Encounter for therapeutic drug level monitoring: Secondary | ICD-10-CM | POA: Diagnosis not present

## 2023-12-30 DIAGNOSIS — I4891 Unspecified atrial fibrillation: Secondary | ICD-10-CM

## 2023-12-30 LAB — POCT INR: INR: 4.4 — AB (ref 2.0–3.0)

## 2023-12-30 NOTE — Progress Notes (Signed)
 INR 4.4  Please see anticoagulation encounter Do not take any warfarin today and Friday then continue taking warfarin 1.5 tablets daily. Recheck INR in 2 weeks. Stay consistent with greens each week (1 per week).  Anticoagulation Clinic [phone].  Clearance Faxed to: [phone]

## 2023-12-30 NOTE — Patient Instructions (Signed)
 Do not take any warfarin today and Friday then continue taking warfarin 1.5 tablets daily. Recheck INR in 2 weeks. Stay consistent with greens each week (1 per week).  Anticoagulation Clinic [phone].  Clearance Faxed to: [phone]

## 2024-01-10 ENCOUNTER — Encounter: Payer: Self-pay | Admitting: Radiology

## 2024-01-11 ENCOUNTER — Other Ambulatory Visit: Payer: Self-pay

## 2024-01-11 ENCOUNTER — Emergency Department (HOSPITAL_BASED_OUTPATIENT_CLINIC_OR_DEPARTMENT_OTHER)

## 2024-01-11 ENCOUNTER — Emergency Department (HOSPITAL_BASED_OUTPATIENT_CLINIC_OR_DEPARTMENT_OTHER)
Admission: EM | Admit: 2024-01-11 | Discharge: 2024-01-11 | Disposition: A | Discharge status: Home / Self Care | Attending: Emergency Medicine | Admitting: Emergency Medicine

## 2024-01-11 ENCOUNTER — Encounter (HOSPITAL_BASED_OUTPATIENT_CLINIC_OR_DEPARTMENT_OTHER): Payer: Self-pay

## 2024-01-11 DIAGNOSIS — Z91040 Latex allergy status: Secondary | ICD-10-CM | POA: Insufficient documentation

## 2024-01-11 DIAGNOSIS — S8012XA Contusion of left lower leg, initial encounter: Secondary | ICD-10-CM | POA: Diagnosis not present

## 2024-01-11 DIAGNOSIS — E039 Hypothyroidism, unspecified: Secondary | ICD-10-CM | POA: Diagnosis not present

## 2024-01-11 DIAGNOSIS — Z79899 Other long term (current) drug therapy: Secondary | ICD-10-CM | POA: Diagnosis not present

## 2024-01-11 DIAGNOSIS — I482 Chronic atrial fibrillation, unspecified: Secondary | ICD-10-CM | POA: Insufficient documentation

## 2024-01-11 DIAGNOSIS — W01198A Fall on same level from slipping, tripping and stumbling with subsequent striking against other object, initial encounter: Secondary | ICD-10-CM | POA: Insufficient documentation

## 2024-01-11 DIAGNOSIS — Z8673 Personal history of transient ischemic attack (TIA), and cerebral infarction without residual deficits: Secondary | ICD-10-CM | POA: Insufficient documentation

## 2024-01-11 DIAGNOSIS — I10 Essential (primary) hypertension: Secondary | ICD-10-CM | POA: Diagnosis not present

## 2024-01-11 DIAGNOSIS — Z7901 Long term (current) use of anticoagulants: Secondary | ICD-10-CM | POA: Insufficient documentation

## 2024-01-11 DIAGNOSIS — S8992XA Unspecified injury of left lower leg, initial encounter: Secondary | ICD-10-CM | POA: Diagnosis present

## 2024-01-11 NOTE — ED Triage Notes (Signed)
 Patient reports a fall a few weeks ago and she now has a hard painful left calf. It is warm to the touch. She is on coumadin  but she says her primary suggested she come in.

## 2024-01-11 NOTE — ED Notes (Addendum)
 DC paperwork given and verbally understood.... Pt DC by Provider before assessment could be performed.SABRASABRA

## 2024-01-11 NOTE — ED Provider Notes (Signed)
 [place] EMERGENCY DEPARTMENT AT [facility] Provider Note   CSN: [id number] Arrival date & time: 01/11/24  1430     Patient presents with: Leg Swelling (Left)   [name] is a 73 y.o. female.   Pt is a 73 yo female with pmhx significant for depression, gerd, htn, afib (on coumadin ), fibromyalgia, hypothyroidism, sleep apnea, cva, and anxiety.  Pt said she fell a few weeks ago and hit her left calf.  She did have some swelling.  Around the same time, her inr was noted to be elevated >10. She was given vitamin k and it went down to 1.3 and is back up to 3 and 4. She said the swelling is much improved, but is still there.  She called her pcp who told her to come in for DVT eval.  Pt denies any cp or sob.  There is minimal pain.  She is able to ambulate without problems.        Prior to Admission medications   Medication Sig Start Date End Date Taking? Authorizing Provider  ACCU-CHEK GUIDE test strip  11/06/20   [provider]  acetaminophen  (TYLENOL ) 500 MG tablet Take 500-1,000 mg by mouth every 6 (six) hours as needed for mild pain or headache.    [provider]  Alcohol Swabs (ALCOHOL WIPES) 70 % PADS Use 1 swab as needed to clean site for blood sugar check. 11/06/20   [provider]  amphetamine-dextroamphetamine (ADDERALL) 10 MG tablet Take 10 mg by mouth. 12/01/23   [provider]  atenolol  (TENORMIN ) 25 MG tablet Take 1.5 tablets (37.5 mg total) by mouth daily. 09/18/19   Lee, Joshua K, MD  Blood Glucose Calibration [facility] CONTROL) SOLN  11/06/20   [provider]  Blood Glucose Monitoring Suppl (ACCU-CHEK GUIDE ME) w/Device KIT daily. 11/06/20   [provider]  Blood Glucose Monitoring Suppl (GLUCOCOM BLOOD GLUCOSE MONITOR) DEVI  11/06/20   [provider]  buPROPion (WELLBUTRIN XL) 150 MG 24 hr tablet Take 150 mg by mouth daily.    [provider]  Cholecalciferol  (VITAMIN D3) 50  MCG (2000 UT) TABS Take 2,000 Int'l Units by mouth daily.    [provider]  colestipol  (COLESTID ) 1 g tablet Take 2 tablets (2 g total) by mouth 2 (two) times daily. 09/29/23   [place], PA-C  Cyanocobalamin  (VITAMIN B-12) 5000 MCG TBDP Take 5,000 mcg by mouth daily.    [provider]  diazepam  (VALIUM ) 2 MG tablet Take 1 tablet (2 mg total) by mouth every 8 (eight) hours as needed for muscle spasms. 06/09/23   Silver Wonda LABOR, PA  dicyclomine  (BENTYL ) 20 MG tablet Take 1 tablet (20 mg total) by mouth 3 (three) times daily before meals. 12/08/23 03/07/24  [place], PA-C  DILT-XR 180 MG 24 hr capsule Take 180 mg by mouth daily. 09/22/23   [provider]  diltiazem  (CARDIZEM  CD) 180 MG 24 hr capsule Take 1 capsule (180 mg total) by mouth daily. 03/13/19   Kerman Soulier, MD  donepezil (ARICEPT) 10 MG tablet Take 10 mg by mouth daily.    [provider]  DULoxetine  (CYMBALTA ) 60 MG capsule Take 1 capsule (60 mg total) by mouth daily. 03/13/19   Helberg, Soulier, MD  escitalopram (LEXAPRO) 20 MG tablet Take 20 mg by mouth daily. 12/01/23   [provider]  ferrous sulfate  325 (65 FE) MG tablet TAKE 1 TABLET BY MOUTH EVERY DAY WITH BREAKFAST 11/05/23  [place], PA-C  glucose blood (PRECISION QID TEST) test strip  11/06/20   [provider]  Lancet Devices (SIMPLE DIAGNOSTICS LANCING DEV) MISC  11/06/20   [provider]  levothyroxine  (SYNTHROID ) 50 MCG tablet Take 1 tablet (50 mcg total) by mouth every morning. 03/13/19   Kerman Soulier, MD  lidocaine  (LIDODERM ) 5 % Place 1 patch onto the skin daily. Remove & Discard patch within 12 hours or as directed by MD 06/09/23   Silver Wonda LABOR, PA  loperamide  (IMODIUM ) 2 MG capsule Take 2 capsules (4 mg total) by mouth 3 (three) times daily. 10/08/23 04/05/24  [place], PA-C  LORazepam  (ATIVAN ) 0.5 MG tablet Take 1.5 mg by mouth. 12/01/23   [provider]  losartan  (COZAAR ) 25 MG  tablet Take 1 tablet (25 mg total) by mouth daily. 03/13/19   Kerman Soulier, MD  metFORMIN  (GLUCOPHAGE -XR) 500 MG 24 hr tablet TAKE 2 TABLETS(1000 MG) BY MOUTH TWICE DAILY WITH FOOD Patient taking differently: Take 500 mg by mouth daily with breakfast. TAKE 1 TABLETS(1000 MG) BY MOUTH TWICE DAILY WITH FOOD starting 10/08/2023 12/12/19   Aslam, Sadia, MD  omeprazole  (PRILOSEC) 40 MG capsule Take 40 mg by mouth daily.    [provider]  ondansetron  (ZOFRAN -ODT) 4 MG disintegrating tablet Take 4 mg by mouth every 8 (eight) hours as needed for vomiting or nausea. 08/02/22   [provider]  potassium chloride  SA (KLOR-CON  M) 20 MEQ tablet Take 1 tablet (20 mEq total) by mouth once for 1 dose. 10/13/23 12/08/23  [place], PA-C  predniSONE  (STERAPRED UNI-PAK 21 TAB) 5 MG (21) TBPK tablet SMARTSIG:- Tablet(s) By Mouth - 12/07/23   [provider]  rosuvastatin  (CRESTOR ) 20 MG tablet Take 1 tablet (20 mg total) by mouth daily. 03/13/19   Kerman Soulier, MD  sodium chloride  (OCEAN) 0.65 % SOLN nasal spray Place 2 sprays into both nostrils as needed for congestion. 12/13/23   Myriam Dorn BROCKS, PA  traMADol  (ULTRAM ) 50 MG tablet Take 50 mg by mouth every 6 (six) hours as needed for moderate pain. 12/04/22   [provider]  warfarin (COUMADIN ) 2.5 MG tablet TAKE 1 AND 1/2 TABLETS BY MOUTH DAILY AS DIRECTED BY COUMADIN  CLINIC 11/18/23   Lavona Agent, MD    Allergies: Gemfibrozil, Latex, Penicillins, Adhesive [tape], Codeine, Digoxin  and related, Metformin  and related, Omeprazole , and Ace inhibitors    Review of Systems  Musculoskeletal:        Left calf pain  All other systems reviewed and are negative.   Updated Vital Signs BP 139/63 (BP Location: Right Arm)   Pulse 60   Temp 97.8 F (36.6 C)   Resp 16   LMP 06/03/1978   SpO2 98%   Physical Exam Vitals and nursing note reviewed.  Constitutional:      Appearance: Normal appearance.  HENT:     Head:  Normocephalic and atraumatic.     Right Ear: External ear normal.     Left Ear: External ear normal.     Nose: Nose normal.     Mouth/Throat:     Mouth: Mucous membranes are moist.     Pharynx: Oropharynx is clear.  Eyes:     Extraocular Movements: Extraocular movements intact.     Conjunctiva/sclera: Conjunctivae normal.     Pupils: Pupils are equal, round, and reactive to light.  Cardiovascular:     Rate and Rhythm: Normal rate and regular rhythm.     Pulses: Normal pulses.  Heart sounds: Normal heart sounds.  Pulmonary:     Effort: Pulmonary effort is normal.     Breath sounds: Normal breath sounds.  Abdominal:     General: Abdomen is flat. Bowel sounds are normal.     Palpations: Abdomen is soft.  Musculoskeletal:     Cervical back: Normal range of motion and neck supple.       Legs:  Skin:    Capillary Refill: Capillary refill takes less than 2 seconds.  Neurological:     General: No focal deficit present.     Mental Status: She is alert and oriented to person, place, and time.  Psychiatric:        Mood and Affect: Mood normal.        Behavior: Behavior normal.     (all labs ordered are listed, but only abnormal results are displayed) Labs Reviewed - No data to display  EKG: None  Radiology: US  Venous Img Lower Unilateral Left Result Date: 01/11/2024 EXAM: ULTRASOUND DUPLEX OF THE LEFT LOWER EXTREMITY VEINS TECHNIQUE: Duplex ultrasound using B-mode/gray scaled imaging and Doppler spectral analysis and color flow was obtained of the deep venous structures of the left lower extremity. COMPARISON: None. CLINICAL HISTORY: left calf swelling FINDINGS: The common femoral vein, femoral vein, popliteal vein, and posterior tibial vein of the left lower extremity demonstrate normal compressibility with normal color flow and spectral analysis. There is an enlarged left inguinal lymph node measuring 2.7 x 1.3 cm. IMPRESSION: 1. No evidence of DVT. 2. Enlarged left inguinal  lymph node measuring 2.7 x 1.3 cm. Electronically signed by: Greig Pique MD 01/11/2024 05:27 PM EST RP Workstation: [id number]     Procedures   Medications Ordered in the ED - No data to display                                  Medical Decision Making  This patient presents to the ED for concern of leg swelling, this involves an extensive number of treatment options, and is a complaint that carries with it a high risk of complications and morbidity.  The differential diagnosis includes hematoma/dvt   Co morbidities that complicate the patient evaluation  depression, gerd, htn, afib (on coumadin ), fibromyalgia, hypothyroidism, sleep apnea, cva, and anxiety   Additional history obtained:  Additional history obtained from epic chart review External records from outside source obtained and reviewed including husband   Imaging Studies ordered:  I ordered imaging studies including us   I independently visualized and interpreted imaging which showed  No evidence of DVT.  2. Enlarged left inguinal lymph node measuring 2.7 x 1.3 cm.   I agree with the radiologist interpretation   Medicines ordered and prescription drug management:   I have reviewed the patients home medicines and have made adjustments as needed   Test Considered:  us    Problem List / ED Course:  Left leg pain:  likely hematoma.  No dvt.  Pt is stable for d/c.    Reevaluation:  After the interventions noted above, I reevaluated the patient and found that they have :improved   Social Determinants of Health:  Lives at home   Dispostion:  After consideration of the diagnostic results and the patients response to treatment, I feel that the patent would benefit from discharge with outpatient f/u.       Final diagnoses:  Hematoma of left lower extremity, initial encounter    ED  Discharge Orders     None          Dean Clarity, MD 01/11/24 2037

## 2024-01-13 ENCOUNTER — Ambulatory Visit: Attending: Cardiology

## 2024-01-13 DIAGNOSIS — I4891 Unspecified atrial fibrillation: Secondary | ICD-10-CM | POA: Diagnosis not present

## 2024-01-13 DIAGNOSIS — Z5181 Encounter for therapeutic drug level monitoring: Secondary | ICD-10-CM

## 2024-01-13 LAB — POCT INR: INR: 1.2 — AB (ref 2.0–3.0)

## 2024-01-13 NOTE — Progress Notes (Signed)
 INR 1.2 Please see anticoagulation encounter Take 2.5 tablets tonight only then continue taking warfarin 1.5 tablets daily. Recheck INR in 2 weeks. Stay consistent with greens each week (1 per week).  Anticoagulation Clinic [phone]. Hemorrhoid Banding 11/18 Clearance Faxed to: [phone]

## 2024-01-13 NOTE — Patient Instructions (Signed)
 Take 2.5 tablets tonight only then continue taking warfarin 1.5 tablets daily. Recheck INR in 2 weeks. Stay consistent with greens each week (1 per week).  Anticoagulation Clinic [phone]. Hemorrhoid Banding 11/18 Clearance Faxed to: [phone]

## 2024-01-14 ENCOUNTER — Telehealth: Payer: Self-pay | Admitting: *Deleted

## 2024-01-14 ENCOUNTER — Telehealth: Payer: Self-pay | Admitting: Physician Assistant

## 2024-01-14 NOTE — Telephone Encounter (Signed)
 Patient's husband, Arvella, left a message stating that they confirmed the patient does not need to hold her warfarin for the upcoming hemorrhoid banding procedure.   Also, this is confirmed with the telephone message from [facility] on today.

## 2024-01-14 NOTE — Telephone Encounter (Signed)
 Inbound call from patient husband wanting to know if his wife is needing to stop her coumadin  2.5 MG 5 days before her Hemorrhoid banding. Please advise.

## 2024-01-14 NOTE — Telephone Encounter (Signed)
 This patient has questions about holding coumadin  prior to hemorrhoidal banding (we no longer hold anticoagulants before bandings). However, she is scheduled for banding with Dr Albertus. She is known to and is a patient of Dr Abran who also does banding procedures. Did Dr Albertus agree to complete bandings for Dr Abran on this patient?

## 2024-01-14 NOTE — Telephone Encounter (Signed)
 TIna-  You are correct! Dr Abran does NOT do banding procedures. I was incorrect in my thinking!   I have contacted patient's husband to advise patient will not need to hold any anticoagulation prior to her banding. He verbalizes understanding.

## 2024-01-14 NOTE — Telephone Encounter (Signed)
 That is correct, ok to proceed without holding warfarin Thanks JMP

## 2024-01-25 ENCOUNTER — Other Ambulatory Visit

## 2024-01-25 ENCOUNTER — Ambulatory Visit: Admitting: Internal Medicine

## 2024-01-25 ENCOUNTER — Encounter: Payer: Self-pay | Admitting: Internal Medicine

## 2024-01-25 VITALS — BP 110/70 | HR 69 | Ht 62.0 in | Wt 132.0 lb

## 2024-01-25 DIAGNOSIS — K529 Noninfective gastroenteritis and colitis, unspecified: Secondary | ICD-10-CM

## 2024-01-25 DIAGNOSIS — K625 Hemorrhage of anus and rectum: Secondary | ICD-10-CM | POA: Diagnosis not present

## 2024-01-25 DIAGNOSIS — K648 Other hemorrhoids: Secondary | ICD-10-CM

## 2024-01-25 NOTE — Patient Instructions (Addendum)
 Your provider has requested that you go to the basement level for lab work before leaving today. Press B on the elevator. The lab is located at the first door on the left as you exit the elevator.  Continue colestipol  for chronic diarrhea.   HEMORRHOID BANDING PROCEDURE    FOLLOW-UP CARE   The procedure you have had should have been relatively painless since the banding of the area involved does not have nerve endings and there is no pain sensation.  The rubber band cuts off the blood supply to the hemorrhoid and the band may fall off as soon as 48 hours after the banding (the band may occasionally be seen in the toilet bowl following a bowel movement). You may notice a temporary feeling of fullness in the rectum which should respond adequately to plain Tylenol  or Motrin .  Following the banding, avoid strenuous exercise that evening and resume full activity the next day.  A sitz bath (soaking in a warm tub) or bidet is soothing, and can be useful for cleansing the area after bowel movements.     To avoid constipation, take two tablespoons of natural wheat bran, natural oat bran, flax, Benefiber or any over the counter fiber supplement and increase your water intake to 7-8 glasses daily.    Unless you have been prescribed anorectal medication, do not put anything inside your rectum for two weeks: No suppositories, enemas, fingers, etc.  Occasionally, you may have more bleeding than usual after the banding procedure.  This is often from the untreated hemorrhoids rather than the treated one.  Don't be concerned if there is a tablespoon or so of blood.  If there is more blood than this, lie flat with your bottom higher than your head and apply an ice pack to the area. If the bleeding does not stop within a half an hour or if you feel faint, call our office at [phone] or go to the emergency room.  Problems are not common; however, if there is a substantial amount of bleeding, severe pain,  chills, fever or difficulty passing urine (very rare) or other problems, you should call us  at [phone] or report to the nearest emergency room.  Do not stay seated continuously for more than 2-3 hours for a day or two after the procedure.  Tighten your buttock muscles 10-15 times every two hours and take 10-15 deep breaths every 1-2 hours.  Do not spend more than a few minutes on the toilet if you cannot empty your bowel; instead re-visit the toilet at a later time.     _______________________________________________________  If your blood pressure at your visit was 140/90 or greater, please contact your primary care physician to follow up on this.  _______________________________________________________  If you are age 75 or older, your body mass index should be between 23-30. Your Body mass index is 24.14 kg/m. If this is out of the aforementioned range listed, please consider follow up with your Primary Care Provider.  If you are age 59 or younger, your body mass index should be between 19-25. Your Body mass index is 24.14 kg/m. If this is out of the aformentioned range listed, please consider follow up with your Primary Care Provider.   ________________________________________________________  The [facility] providers would like to encourage you to use MYCHART to communicate with providers for non-urgent requests or questions.  Due to long hold times on the telephone, sending your provider a message by [facility] may be a faster and  more efficient way to get a response.  Please allow 48 business hours for a response.  Please remember that this is for non-urgent requests.  _______________________________________________________  [facility] is using a team-based approach to care.  Your team is made up of your doctor and two to three APPS. Our APPS (Nurse Practitioners and Physician Assistants) work with your physician to ensure care continuity for you. They are fully  qualified to address your health concerns and develop a treatment plan. They communicate directly with your gastroenterologist to care for you. Seeing the Advanced Practice Practitioners on your physician's team can help you by facilitating care more promptly, often allowing for earlier appointments, access to diagnostic testing, procedures, and other specialty referrals.

## 2024-01-26 ENCOUNTER — Ambulatory Visit: Payer: Self-pay | Admitting: Internal Medicine

## 2024-01-26 LAB — CBC WITH DIFFERENTIAL/PLATELET
Basophils Absolute: 0 K/uL (ref 0.0–0.1)
Basophils Relative: 0.6 % (ref 0.0–3.0)
Eosinophils Absolute: 0.4 K/uL (ref 0.0–0.7)
Eosinophils Relative: 7.9 % — ABNORMAL HIGH (ref 0.0–5.0)
HCT: 40.3 % (ref 36.0–46.0)
Hemoglobin: 13.6 g/dL (ref 12.0–15.0)
Lymphocytes Relative: 31.5 % (ref 12.0–46.0)
Lymphs Abs: 1.8 K/uL (ref 0.7–4.0)
MCHC: 33.8 g/dL (ref 30.0–36.0)
MCV: 92.1 fl (ref 78.0–100.0)
Monocytes Absolute: 0.4 K/uL (ref 0.1–1.0)
Monocytes Relative: 6.2 % (ref 3.0–12.0)
Neutro Abs: 3 K/uL (ref 1.4–7.7)
Neutrophils Relative %: 53.8 % (ref 43.0–77.0)
Platelets: 176 K/uL (ref 150.0–400.0)
RBC: 4.38 Mil/uL (ref 3.87–5.11)
RDW: 16.8 % — ABNORMAL HIGH (ref 11.5–15.5)
WBC: 5.6 K/uL (ref 4.0–10.5)

## 2024-01-26 LAB — PROTIME-INR
INR: 2.7 ratio — ABNORMAL HIGH (ref 0.8–1.0)
Prothrombin Time: 27.6 s — ABNORMAL HIGH (ref 9.6–13.1)

## 2024-01-26 NOTE — Progress Notes (Signed)
 Subjective:    Patient ID: [name], female    DOB: September 30, 1950, 73 y.o.   MRN: [id number]  HPI [name] is a 73 year old female who presents with hemorrhoidal bleeding. She is accompanied by her husband, Niley Helbig. She experiences rectal bleeding, which has not improved with hydrocortisone  suppositories. The bleeding is bright red and occurs spontaneously, even when urinating, and particularly after bowel movements. She experiences no significant pain associated with the bleeding.  She has a history of chronic diarrhea, experiencing loose stools approximately four times a day, influenced by dietary intake. Cholestipol has been used to manage the diarrhea, providing some relief. Wiping after bowel movements sometimes results in gushing blood.  Her past medical history includes atrial fibrillation, carotid stenosis, hypertension, diabetes, hypothyroidism, and fatty liver. She is on warfarin for atrial fibrillation. She has had multiple colonoscopies, with the most recent in September 2024, showing small internal hemorrhoids and diverticulosis but no polyps or microscopic colitis.  She recalls a previous episode of a high INR of 8.8, which led to epistaxis and required treatment with vitamin K . Her INR was 4.4 a week and a half ago, and she is scheduled for regular monitoring every two weeks.   Review of Systems As per HPI, otherwise negative  Current Medications, Allergies, Past Medical History, Past Surgical History, Family History and Social History were reviewed in [employer] record.    Objective:   Physical Exam BP 110/70   Pulse 69   Ht 5' 2 (1.575 m)   Wt 132 lb (59.9 kg)   LMP 06/03/1978   BMI 24.14 kg/m  Gen: awake, alert, NAD HEENT: anicteric  Abd: soft, NT/ND, +BS throughout ANOSCOPY: Using a disposable, lubricated, slotted, self-illuminating anoscope, the rectum was intubated without difficulty. The trochar was removed and  the ano-rectum was circumferentially inspected. There were internal hemorrhoids, RA and LL. There was no finding of an anorectal fissure. The rectal mucosa was not inflamed. No neoplasia or other pathology was identified. The inspection was well tolerated.   Ext: no c/c/e Neuro: nonfocal     Latest Ref Rng & Units 01/25/2024    5:17 PM 12/14/2023   11:22 AM 12/13/2023    4:07 PM  CBC  WBC 4.0 - 10.5 K/uL 5.6  7.2  6.7   Hemoglobin 12.0 - 15.0 g/dL 86.3  88.0  88.1   Hematocrit 36.0 - 46.0 % 40.3  36.1  37.2   Platelets 150.0 - 400.0 K/uL 176.0  233  269    Lab Results  Component Value Date   INR 2.7 (H) 01/25/2024   INR 1.2 (A) 01/13/2024   INR 4.4 (A) 12/30/2023   PROTIME 18.8 08/20/2008         Assessment & Plan:   1.  Symptomatic bleeding internal hemorrhoids  PROCEDURE NOTE:  The patient presents with symptomatic grade 2 internal hemorrhoids, requesting rubber band ligation of his hemorrhoidal disease.  All risks, benefits and alternative forms of therapy were described and informed consent was obtained.  We discussed the slightly high risk of post banding hemorrhage in the setting of warfarin.  Her INR should be monitored routinely and to the best extent possible kept in the therapeutic range.  Supratherapeutic INR may increase the cause of post banding hemorrhage.  The anorectum was pre-medicated with 0.125% nitroglycerin  ointment The decision was made to band the LL internal hemorrhoid, and the [facility]an System was used to perform band ligation without complication.  Digital anorectal examination was then performed to assure proper positioning of the band, and to adjust the banded tissue as required.  The patient was discharged home without pain or other issues.  Dietary and behavioral recommendations were given and along with follow-up instructions.     The patient will return as scheduled for follow-up and possible additional banding as required. No complications  were encountered and the patient tolerated the procedure well.  2.  Chronic diarrhea Favorable response to colestipol  -Continue colestipol  1 to 2 tablets daily

## 2024-01-28 ENCOUNTER — Ambulatory Visit: Attending: Cardiology | Admitting: *Deleted

## 2024-01-28 DIAGNOSIS — Z5181 Encounter for therapeutic drug level monitoring: Secondary | ICD-10-CM | POA: Diagnosis not present

## 2024-01-28 DIAGNOSIS — I4891 Unspecified atrial fibrillation: Secondary | ICD-10-CM

## 2024-01-28 LAB — POCT INR: INR: 2.9 (ref 2.0–3.0)

## 2024-01-28 NOTE — Patient Instructions (Signed)
 Description   INR-2.9; Continue taking warfarin 1.5 tablets daily. Recheck INR in 3 weeks. Stay consistent with greens each week (1 per week).  Anticoagulation Clinic [phone].  Clearance Faxed to: [phone]

## 2024-01-28 NOTE — Progress Notes (Signed)
 Description   INR-2.9; Continue taking warfarin 1.5 tablets daily. Recheck INR in 3 weeks. Stay consistent with greens each week (1 per week).  Anticoagulation Clinic [phone].  Clearance Faxed to: [phone]

## 2024-02-07 ENCOUNTER — Telehealth: Payer: Self-pay

## 2024-02-07 NOTE — Telephone Encounter (Signed)
 Spoke with patient and relayed her lab results per Dr.  Albertus.  Confirmed next banding appointment

## 2024-02-15 ENCOUNTER — Other Ambulatory Visit: Payer: Self-pay | Admitting: Cardiology

## 2024-02-15 DIAGNOSIS — I482 Chronic atrial fibrillation, unspecified: Secondary | ICD-10-CM

## 2024-02-16 ENCOUNTER — Encounter: Admitting: Internal Medicine

## 2024-02-16 ENCOUNTER — Encounter: Payer: Self-pay | Admitting: Internal Medicine

## 2024-02-16 ENCOUNTER — Ambulatory Visit: Admitting: Internal Medicine

## 2024-02-16 VITALS — BP 108/56 | HR 57 | Ht 62.0 in | Wt 133.0 lb

## 2024-02-16 DIAGNOSIS — K648 Other hemorrhoids: Secondary | ICD-10-CM

## 2024-02-16 DIAGNOSIS — K641 Second degree hemorrhoids: Secondary | ICD-10-CM | POA: Diagnosis not present

## 2024-02-16 NOTE — Progress Notes (Signed)
 [name] is a 73 year old female seen for follow-up of hemorrhoidal bleeding and for additional banding  Initial hemorrhoidal banding performed on 01/25/2024 for symptomatic bleeding and prolapsing internal hemorrhoids  Excellent response with initial banding, only scant blood with wiping on occasion.  Less prolapse and much less bleeding  Anoscopy at her last visit revealed hemorrhoidal disease internally at the left lateral and right anterior position  Left lateral banded at visit #1   PROCEDURE NOTE:  The patient presents with symptomatic grade 2 internal hemorrhoids, requesting rubber band ligation of her hemorrhoidal disease.  All risks, benefits and alternative forms of therapy were described and informed consent was obtained.   The anorectum was pre-medicated with 0.125% nitroglycerin  ointment The decision was made to band the RA internal hemorrhoid, and the [facility] ORegan System was used to perform band ligation without complication.   Digital anorectal examination was then performed to assure proper positioning of the band, and to adjust the banded tissue as required.  The patient was discharged home without pain or other issues.  Dietary and behavioral recommendations were given and along with follow-up instructions.    The patient will return as needed follow-up and possible additional banding as required. No complications were encountered and the patient tolerated the procedure well. She was reminded if she has any significant bleeding particular in the setting of her warfarin that she should call us  immediately.  She voices understanding.

## 2024-02-16 NOTE — Patient Instructions (Signed)

## 2024-02-17 ENCOUNTER — Ambulatory Visit: Attending: Cardiology

## 2024-02-17 DIAGNOSIS — I4891 Unspecified atrial fibrillation: Secondary | ICD-10-CM | POA: Diagnosis not present

## 2024-02-17 DIAGNOSIS — Z5181 Encounter for therapeutic drug level monitoring: Secondary | ICD-10-CM

## 2024-02-17 LAB — POCT INR: INR: 3.2 — AB (ref 2.0–3.0)

## 2024-02-17 NOTE — Progress Notes (Signed)
 INR 3.2 Please see anticoagulation encounter Continue taking warfarin 1.5 tablets daily.  Eat greens today.  Recheck INR in 4 weeks. Stay consistent with greens each week (1 per week).  Anticoagulation Clinic [phone].  Clearance Faxed to: [phone]

## 2024-02-17 NOTE — Patient Instructions (Signed)
 Continue taking warfarin 1.5 tablets daily.  Eat greens today.  Recheck INR in 4 weeks. Stay consistent with greens each week (1 per week).  Anticoagulation Clinic [phone].  Clearance Faxed to: [phone]

## 2024-03-03 ENCOUNTER — Other Ambulatory Visit: Payer: Self-pay | Admitting: Physician Assistant

## 2024-03-03 DIAGNOSIS — K529 Noninfective gastroenteritis and colitis, unspecified: Secondary | ICD-10-CM

## 2024-03-03 DIAGNOSIS — K580 Irritable bowel syndrome with diarrhea: Secondary | ICD-10-CM

## 2024-03-16 ENCOUNTER — Ambulatory Visit

## 2024-03-28 ENCOUNTER — Other Ambulatory Visit: Payer: Self-pay | Admitting: Physician Assistant

## 2024-03-28 DIAGNOSIS — K580 Irritable bowel syndrome with diarrhea: Secondary | ICD-10-CM

## 2024-03-28 DIAGNOSIS — K529 Noninfective gastroenteritis and colitis, unspecified: Secondary | ICD-10-CM

## 2024-04-01 ENCOUNTER — Other Ambulatory Visit: Payer: Self-pay | Admitting: Physician Assistant

## 2024-04-01 DIAGNOSIS — K529 Noninfective gastroenteritis and colitis, unspecified: Secondary | ICD-10-CM

## 2024-04-03 ENCOUNTER — Other Ambulatory Visit: Payer: Self-pay | Admitting: Cardiology

## 2024-04-03 DIAGNOSIS — I482 Chronic atrial fibrillation, unspecified: Secondary | ICD-10-CM

## 2024-04-04 NOTE — Telephone Encounter (Addendum)
 Warfarin 2.5mg  Dx-Afib Last INR Check-02/17/24 and missed 1/8 appt Last OV- 01/29/23-needs appt   Need appts  @1046am  called patient and husbands numbers and had to leave messages to call back.  Message sent to Schedulers, too.   Spoke with husband and set up an appt. Request to see [name] added to appt note.

## 2024-04-06 ENCOUNTER — Ambulatory Visit: Payer: Medicare (Managed Care) | Attending: Cardiology

## 2024-04-06 DIAGNOSIS — I4891 Unspecified atrial fibrillation: Secondary | ICD-10-CM

## 2024-04-06 DIAGNOSIS — Z5181 Encounter for therapeutic drug level monitoring: Secondary | ICD-10-CM | POA: Diagnosis not present

## 2024-04-06 LAB — POCT INR: INR: 2 (ref 2.0–3.0)

## 2024-04-06 NOTE — Progress Notes (Signed)
 INR 2.0  Continue taking warfarin 1.5 tablets daily.   Recheck INR in 3 weeks. Stay consistent with greens each week (1 per week).  Anticoagulation Clinic [phone].  Clearance Faxed to: [phone]

## 2024-04-06 NOTE — Patient Instructions (Signed)
 Continue taking warfarin 1.5 tablets daily.   Recheck INR in 3 weeks. Stay consistent with greens each week (1 per week).  Anticoagulation Clinic [phone].  Clearance Faxed to: [phone]

## 2024-04-28 ENCOUNTER — Ambulatory Visit: Admitting: Cardiology

## 2024-04-28 ENCOUNTER — Ambulatory Visit
